# Patient Record
Sex: Female | Born: 1980 | Race: Black or African American | Hispanic: No | Marital: Single | Smoking: Never smoker
Health system: Southern US, Community
[De-identification: ages and names within clinical notes are randomized; demographics above are authoritative.]

## PROBLEM LIST (undated history)

## (undated) ENCOUNTER — Emergency Department (HOSPITAL_COMMUNITY): Admission: EM | Payer: Medicare Other

## (undated) DIAGNOSIS — D571 Sickle-cell disease without crisis: Secondary | ICD-10-CM

## (undated) DIAGNOSIS — L309 Dermatitis, unspecified: Secondary | ICD-10-CM

## (undated) DIAGNOSIS — J45909 Unspecified asthma, uncomplicated: Secondary | ICD-10-CM

## (undated) DIAGNOSIS — Z86711 Personal history of pulmonary embolism: Secondary | ICD-10-CM

## (undated) HISTORY — PX: WISDOM TOOTH EXTRACTION: SHX21

## (undated) HISTORY — PX: TUBAL LIGATION: SHX77

## (undated) HISTORY — PX: ERCP: SHX60

## (undated) HISTORY — PX: PORTA CATH INSERTION: CATH118285

## (undated) HISTORY — PX: CHOLECYSTECTOMY: SHX55

## (undated) HISTORY — PX: JOINT REPLACEMENT: SHX530

---

## 2010-06-25 DIAGNOSIS — D5700 Hb-SS disease with crisis, unspecified: Secondary | ICD-10-CM | POA: Insufficient documentation

## 2011-10-07 DIAGNOSIS — Z8632 Personal history of gestational diabetes: Secondary | ICD-10-CM | POA: Insufficient documentation

## 2012-03-27 DIAGNOSIS — D72829 Elevated white blood cell count, unspecified: Secondary | ICD-10-CM

## 2012-04-03 DIAGNOSIS — D473 Essential (hemorrhagic) thrombocythemia: Secondary | ICD-10-CM

## 2012-06-29 DIAGNOSIS — E237 Disorder of pituitary gland, unspecified: Secondary | ICD-10-CM | POA: Insufficient documentation

## 2012-07-31 DIAGNOSIS — R701 Abnormal plasma viscosity: Secondary | ICD-10-CM | POA: Insufficient documentation

## 2012-08-01 DIAGNOSIS — R52 Pain, unspecified: Secondary | ICD-10-CM | POA: Insufficient documentation

## 2012-10-30 DIAGNOSIS — D571 Sickle-cell disease without crisis: Secondary | ICD-10-CM | POA: Insufficient documentation

## 2012-11-14 DIAGNOSIS — R079 Chest pain, unspecified: Secondary | ICD-10-CM

## 2013-03-02 DIAGNOSIS — N390 Urinary tract infection, site not specified: Secondary | ICD-10-CM | POA: Insufficient documentation

## 2013-05-12 DIAGNOSIS — M1610 Unilateral primary osteoarthritis, unspecified hip: Secondary | ICD-10-CM | POA: Insufficient documentation

## 2013-05-12 DIAGNOSIS — M169 Osteoarthritis of hip, unspecified: Secondary | ICD-10-CM | POA: Insufficient documentation

## 2013-05-19 DIAGNOSIS — Z96649 Presence of unspecified artificial hip joint: Secondary | ICD-10-CM | POA: Insufficient documentation

## 2013-09-19 DIAGNOSIS — E876 Hypokalemia: Secondary | ICD-10-CM

## 2013-11-01 DIAGNOSIS — R52 Pain, unspecified: Secondary | ICD-10-CM

## 2013-12-13 DIAGNOSIS — R112 Nausea with vomiting, unspecified: Secondary | ICD-10-CM | POA: Insufficient documentation

## 2013-12-13 DIAGNOSIS — L299 Pruritus, unspecified: Secondary | ICD-10-CM | POA: Insufficient documentation

## 2014-02-01 DIAGNOSIS — I340 Nonrheumatic mitral (valve) insufficiency: Secondary | ICD-10-CM | POA: Insufficient documentation

## 2017-06-13 DIAGNOSIS — Z9189 Other specified personal risk factors, not elsewhere classified: Secondary | ICD-10-CM | POA: Insufficient documentation

## 2017-10-30 DIAGNOSIS — F419 Anxiety disorder, unspecified: Secondary | ICD-10-CM | POA: Insufficient documentation

## 2017-10-30 DIAGNOSIS — F32A Depression, unspecified: Secondary | ICD-10-CM | POA: Insufficient documentation

## 2018-05-19 DIAGNOSIS — J45909 Unspecified asthma, uncomplicated: Secondary | ICD-10-CM

## 2018-06-02 DIAGNOSIS — K219 Gastro-esophageal reflux disease without esophagitis: Secondary | ICD-10-CM | POA: Insufficient documentation

## 2018-07-03 DIAGNOSIS — D638 Anemia in other chronic diseases classified elsewhere: Secondary | ICD-10-CM | POA: Insufficient documentation

## 2018-07-03 DIAGNOSIS — Z86718 Personal history of other venous thrombosis and embolism: Secondary | ICD-10-CM | POA: Insufficient documentation

## 2018-07-03 DIAGNOSIS — D649 Anemia, unspecified: Secondary | ICD-10-CM | POA: Insufficient documentation

## 2018-07-03 DIAGNOSIS — J4540 Moderate persistent asthma, uncomplicated: Secondary | ICD-10-CM | POA: Insufficient documentation

## 2018-07-03 DIAGNOSIS — Z9289 Personal history of other medical treatment: Secondary | ICD-10-CM | POA: Insufficient documentation

## 2018-08-28 DIAGNOSIS — F1110 Opioid abuse, uncomplicated: Secondary | ICD-10-CM | POA: Insufficient documentation

## 2018-09-13 DIAGNOSIS — Z95828 Presence of other vascular implants and grafts: Secondary | ICD-10-CM | POA: Insufficient documentation

## 2018-09-13 DIAGNOSIS — F1120 Opioid dependence, uncomplicated: Secondary | ICD-10-CM | POA: Insufficient documentation

## 2018-09-26 DIAGNOSIS — I2699 Other pulmonary embolism without acute cor pulmonale: Secondary | ICD-10-CM | POA: Insufficient documentation

## 2018-09-26 DIAGNOSIS — Z86711 Personal history of pulmonary embolism: Secondary | ICD-10-CM

## 2018-10-02 ENCOUNTER — Emergency Department (HOSPITAL_COMMUNITY)
Admission: EM | Admit: 2018-10-02 | Discharge: 2018-10-03 | Disposition: A | Discharge status: Home / Self Care | Payer: Medicare Other | Attending: Emergency Medicine | Admitting: Emergency Medicine

## 2018-10-02 ENCOUNTER — Emergency Department (HOSPITAL_COMMUNITY): Payer: Medicare Other

## 2018-10-02 ENCOUNTER — Encounter (HOSPITAL_COMMUNITY): Payer: Self-pay

## 2018-10-02 DIAGNOSIS — D5700 Hb-SS disease with crisis, unspecified: Secondary | ICD-10-CM | POA: Diagnosis not present

## 2018-10-02 DIAGNOSIS — Z7901 Long term (current) use of anticoagulants: Secondary | ICD-10-CM

## 2018-10-02 DIAGNOSIS — R079 Chest pain, unspecified: Secondary | ICD-10-CM | POA: Diagnosis present

## 2018-10-02 DIAGNOSIS — J45909 Unspecified asthma, uncomplicated: Secondary | ICD-10-CM | POA: Diagnosis not present

## 2018-10-02 DIAGNOSIS — Z79899 Other long term (current) drug therapy: Secondary | ICD-10-CM | POA: Diagnosis not present

## 2018-10-02 HISTORY — DX: Unspecified asthma, uncomplicated: J45.909

## 2018-10-02 HISTORY — DX: Dermatitis, unspecified: L30.9

## 2018-10-02 HISTORY — DX: Sickle-cell disease without crisis: D57.1

## 2018-10-02 LAB — COMPREHENSIVE METABOLIC PANEL
ALT: 34 U/L (ref 0–44)
AST: 44 U/L — ABNORMAL HIGH (ref 15–41)
Albumin: 4.2 g/dL (ref 3.5–5.0)
Alkaline Phosphatase: 80 U/L (ref 38–126)
Anion gap: 7 (ref 5–15)
BUN: 6 mg/dL (ref 6–20)
CO2: 23 mmol/L (ref 22–32)
Calcium: 8.8 mg/dL — ABNORMAL LOW (ref 8.9–10.3)
Chloride: 108 mmol/L (ref 98–111)
Creatinine, Ser: 0.35 mg/dL — ABNORMAL LOW (ref 0.44–1.00)
GFR calc Af Amer: >60 mL/min (ref >60)
GFR calc non Af Amer: >60 mL/min (ref >60)
Glucose, Bld: 100 mg/dL — ABNORMAL HIGH (ref 70–99)
Potassium: 3.5 mmol/L (ref 3.5–5.1)
Sodium: 138 mmol/L (ref 135–145)
Total Bilirubin: 3 mg/dL — ABNORMAL HIGH (ref 0.3–1.2)
Total Protein: 8.3 g/dL — ABNORMAL HIGH (ref 6.5–8.1)

## 2018-10-02 LAB — CBC WITH DIFFERENTIAL/PLATELET
Abs Immature Granulocytes: 0.1 10*3/uL — ABNORMAL HIGH (ref 0.00–0.07)
Basophils Absolute: 0.1 10*3/uL (ref 0.0–0.1)
Basophils Relative: 1 %
Eosinophils Absolute: 0.3 10*3/uL (ref 0.0–0.5)
Eosinophils Relative: 2 %
HCT: 23.5 % — ABNORMAL LOW (ref 36.0–46.0)
Hemoglobin: 8 g/dL — ABNORMAL LOW (ref 12.0–15.0)
Immature Granulocytes: 1 %
Lymphocytes Relative: 21 %
Lymphs Abs: 3.2 10*3/uL (ref 0.7–4.0)
MCH: 30.7 pg (ref 26.0–34.0)
MCHC: 34 g/dL (ref 30.0–36.0)
MCV: 90 fL (ref 80.0–100.0)
Monocytes Absolute: 1.9 10*3/uL — ABNORMAL HIGH (ref 0.1–1.0)
Monocytes Relative: 12 %
Neutro Abs: 10.2 10*3/uL — ABNORMAL HIGH (ref 1.7–7.7)
Neutrophils Relative %: 63 %
Platelets: 619 10*3/uL — ABNORMAL HIGH (ref 150–400)
RBC: 2.61 MIL/uL — ABNORMAL LOW (ref 3.87–5.11)
RDW: 20 % — ABNORMAL HIGH (ref 11.5–15.5)
WBC: 15.8 10*3/uL — ABNORMAL HIGH (ref 4.0–10.5)
nRBC: 0.4 % — ABNORMAL HIGH (ref 0.0–0.2)

## 2018-10-02 LAB — I-STAT BETA HCG BLOOD, ED (MC, WL, AP ONLY): I-stat hCG, quantitative: <5 m[IU]/mL (ref <5)

## 2018-10-02 LAB — RETICULOCYTES
Immature Retic Fract: 12.3 % (ref 2.3–15.9)
RBC.: 2.61 MIL/uL — ABNORMAL LOW (ref 3.87–5.11)
Retic Count, Absolute: 436 10*3/uL — ABNORMAL HIGH (ref 19.0–186.0)
Retic Ct Pct: 15.9 % — ABNORMAL HIGH (ref 0.4–3.1)

## 2018-10-02 LAB — PROTIME-INR
INR: 1.9 — ABNORMAL HIGH (ref 0.8–1.2)
Prothrombin Time: 21.6 seconds — ABNORMAL HIGH (ref 11.4–15.2)

## 2018-10-02 LAB — TROPONIN I: Troponin I: <0.03 ng/mL (ref <0.03)

## 2018-10-02 MED ORDER — BUDESONIDE-FORMOTEROL FUMARATE 160-4.5 MCG/ACT IN AERO
2.00 | INHALATION_SPRAY | RESPIRATORY_TRACT | Status: DC
Start: 2018-09-30 — End: 2018-10-02

## 2018-10-02 MED ORDER — ENOXAPARIN SODIUM 300 MG/3ML IJ SOLN
1.00 | INTRAMUSCULAR | Status: DC
Start: 2018-09-30 — End: 2018-10-02

## 2018-10-02 MED ORDER — DIPHENHYDRAMINE HCL 50 MG/ML IJ SOLN
25.00 | INTRAMUSCULAR | Status: DC
Start: ? — End: 2018-10-02

## 2018-10-02 MED ORDER — DEFERASIROX 500 MG PO TBSO
500.00 | ORAL_TABLET | ORAL | Status: DC
Start: 2018-10-01 — End: 2018-10-02

## 2018-10-02 MED ORDER — VITAMIN B-12 1000 MCG PO TABS
1000.00 | ORAL_TABLET | ORAL | Status: DC
Start: 2018-10-01 — End: 2018-10-02

## 2018-10-02 MED ORDER — PROMETHAZINE HCL 25 MG/ML IJ SOLN
12.5000 mg | Freq: Once | INTRAMUSCULAR | Status: AC
  Administered 2018-10-02: 12.5 mg via INTRAVENOUS

## 2018-10-02 MED ORDER — GENERIC EXTERNAL MEDICATION
Status: DC
Start: ? — End: 2018-10-02

## 2018-10-02 MED ORDER — DOCUSATE SODIUM 100 MG PO CAPS
100.00 | ORAL_CAPSULE | ORAL | Status: DC
Start: ? — End: 2018-10-02

## 2018-10-02 MED ORDER — POLYETHYLENE GLYCOL 3350 17 G PO PACK
17.00 | PACK | ORAL | Status: DC
Start: ? — End: 2018-10-02

## 2018-10-02 MED ORDER — HYDROMORPHONE HCL 2 MG/ML IJ SOLN
2.0000 mg | Freq: Once | INTRAMUSCULAR | Status: AC
  Administered 2018-10-02: 23:00:00 2 mg via INTRAVENOUS
  Filled 2018-10-02: qty 1

## 2018-10-02 MED ORDER — ONDANSETRON 4 MG PO TBDP
4.00 | ORAL_TABLET | ORAL | Status: DC
Start: ? — End: 2018-10-02

## 2018-10-02 MED ORDER — ALUMINUM-MAGNESIUM-SIMETHICONE 200-200-20 MG/5ML PO SUSP
15.00 | ORAL | Status: DC
Start: ? — End: 2018-10-02

## 2018-10-02 MED ORDER — GENERIC EXTERNAL MEDICATION
1.00 | Status: DC
Start: ? — End: 2018-10-02

## 2018-10-02 MED ORDER — PANTOPRAZOLE SODIUM 40 MG PO TBEC
40.00 | DELAYED_RELEASE_TABLET | ORAL | Status: DC
Start: 2018-10-01 — End: 2018-10-02

## 2018-10-02 MED ORDER — ONDANSETRON HCL 4 MG/2ML IJ SOLN
4.0000 mg | INTRAMUSCULAR | Status: DC | PRN
  Filled 2018-10-02: qty 2

## 2018-10-02 MED ORDER — DIPHENHYDRAMINE HCL 25 MG PO CAPS
25.0000 mg | ORAL_CAPSULE | ORAL | Status: DC | PRN
  Administered 2018-10-02: 25 mg via ORAL
  Filled 2018-10-02: qty 1

## 2018-10-02 MED ORDER — HYDROMORPHONE HCL 1 MG/ML IJ SOLN
1.0000 mg | Freq: Once | INTRAMUSCULAR | Status: AC
  Administered 2018-10-02: 22:00:00 1 mg via SUBCUTANEOUS
  Filled 2018-10-02: qty 1

## 2018-10-02 MED ORDER — PROMETHAZINE HCL 25 MG/ML IJ SOLN
INTRAMUSCULAR | Status: AC
  Filled 2018-10-02: qty 1

## 2018-10-02 MED ORDER — SIMETHICONE 80 MG PO CHEW
80.00 | CHEWABLE_TABLET | ORAL | Status: DC
Start: ? — End: 2018-10-02

## 2018-10-02 MED ORDER — GLUTAMINE 5 G PO PACK
15.00 | PACK | ORAL | Status: DC
Start: 2018-09-30 — End: 2018-10-02

## 2018-10-02 MED ORDER — SODIUM CHLORIDE 0.45 % IV SOLN
INTRAVENOUS | Status: DC
  Administered 2018-10-02: 22:00:00 via INTRAVENOUS

## 2018-10-02 MED ORDER — ALBUTEROL SULFATE 2.5 MG/0.5ML IN NEBU
2.50 | INHALATION_SOLUTION | RESPIRATORY_TRACT | Status: DC
Start: ? — End: 2018-10-02

## 2018-10-02 MED ORDER — GENERIC EXTERNAL MEDICATION
10.00 | Status: DC
Start: ? — End: 2018-10-02

## 2018-10-02 MED ORDER — FOLIC ACID 1 MG PO TABS
1.00 | ORAL_TABLET | ORAL | Status: DC
Start: 2018-10-01 — End: 2018-10-02

## 2018-10-02 MED ORDER — HYDROMORPHONE HCL 2 MG PO TABS
4.00 | ORAL_TABLET | ORAL | Status: DC
Start: ? — End: 2018-10-02

## 2018-10-02 MED ORDER — ONDANSETRON HCL 4 MG/2ML IJ SOLN
4.00 | INTRAMUSCULAR | Status: DC
Start: ? — End: 2018-10-02

## 2018-10-02 MED ORDER — MIRTAZAPINE 15 MG PO TABS
45.00 | ORAL_TABLET | ORAL | Status: DC
Start: 2018-09-30 — End: 2018-10-02

## 2018-10-02 MED ORDER — MELATONIN 3 MG PO TABS
3.00 | ORAL_TABLET | ORAL | Status: DC
Start: ? — End: 2018-10-02

## 2018-10-02 MED ORDER — HYDROMORPHONE HCL 1 MG/ML IJ SOLN
2.00 | INTRAMUSCULAR | Status: DC
Start: ? — End: 2018-10-02

## 2018-10-02 MED ORDER — NALOXONE HCL 0.4 MG/ML IJ SOLN
.04 | INTRAMUSCULAR | Status: DC
Start: ? — End: 2018-10-02

## 2018-10-02 MED ORDER — PROMETHAZINE HCL 25 MG PO TABS
12.50 | ORAL_TABLET | ORAL | Status: DC
Start: ? — End: 2018-10-02

## 2018-10-02 MED ORDER — WARFARIN SODIUM 2.5 MG PO TABS
2.50 | ORAL_TABLET | ORAL | Status: DC
Start: ? — End: 2018-10-02

## 2018-10-02 NOTE — ED Triage Notes (Signed)
Pt complains of an achy chest pain for two days due to her sickle cell  Pt denies a cough or short of breath

## 2018-10-02 NOTE — ED Provider Notes (Signed)
[facility] DEPT Provider Note   CSN: [id number] Arrival date & time: 10/02/18  1920    History   Chief Complaint Chief Complaint  Patient presents with  . Sickle Cell Pain Crisis    HPI Molly Gutierrez is a 38 y.o. female.     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  Patient reports that this feels consistent with her prior sickle cell pain crises.  Patient reports that she usually gets chest pain in the substernal region described as achy with her sickle cell disease.  She denies any shortness of breath, cough, or fever.  Patient denies any abdominal pain, nausea, vomiting, or diarrhea.  She denies any hemoptysis.  Patient denies any symptoms with exertion.  Patient was discharged 2 days ago from [facility] in [place], [place] for her pulmonary embolism.  Patient recently established care with a new hematologist in the [facility].  Previously followed by [facility].  Patient reports that she was on her way to visit her previous provider in [place] when she stopped in [place] due to the pain. Reports supportive family who is assisting in medical transport.   Past Medical History:  Diagnosis Date  . Asthma   . Eczema   . Sickle cell anemia (HCC)     There are no active problems to display for this patient.   History reviewed. No pertinent surgical history.   OB History   No obstetric history on file.      Home Medications    Prior to Admission medications   Medication Sig Start Date End Date Taking? Authorizing Provider  albuterol (VENTOLIN HFA) 108 (90 Base) MCG/ACT inhaler Inhale 2 puffs into the lungs 2 (two) times daily as needed for wheezing or shortness of breath. 12/31/16  Yes  [provider]  Deferasirox 360 MG TABS Take 1,080 mg by mouth daily. 09/27/16  Yes [provider]  diphenhydrAMINE (BENADRYL) 25 mg capsule Take 25 mg by mouth 3 (three) times daily as needed for itching.   Yes [provider]  folic acid (FOLVITE) 1 MG tablet Take 1 mg by mouth daily. 12/31/16  Yes [provider]  Glutamine (GLUTASOLVE) 15 g PACK Take 15 g by mouth every 12 (twelve) hours. 09/23/18 10/24/18 Yes [provider]  HYDROmorphone (DILAUDID) 4 MG tablet Take 4 mg by mouth every 3 (three) hours as needed. 09/30/18  Yes [provider]  mirtazapine (REMERON) 45 MG tablet Take 45 mg by mouth at bedtime. 09/30/18  Yes [provider]  omeprazole (PRILOSEC) 20 MG capsule Take 20 mg by mouth 2 (two) times a day. 09/08/18  Yes [provider]  vitamin B-12 (CYANOCOBALAMIN) 1000 MCG tablet Take 1,000 mcg by mouth daily.   Yes [provider]  warfarin (COUMADIN) 5 MG tablet Take 5 mg by mouth 2 (two) times a day. 09/30/18  Yes [provider]    Family History History reviewed. No pertinent family history.  Social History Social History   Tobacco Use  . Smoking status: Never Smoker  . Smokeless tobacco: Never Used  Substance Use Topics  . Alcohol use: Never    Frequency: Never  . Drug use: Never     Allergies   Cefaclor and Hydroxyurea   Review of Systems Review of  Systems  Constitutional: Negative for chills and fever.  HENT: Negative for congestion, rhinorrhea, sinus pain and sore throat.   Eyes: Negative for visual disturbance.  Respiratory: Negative for cough, chest tightness and shortness of breath.   Cardiovascular: Positive for chest pain. Negative for palpitations and leg swelling.  Gastrointestinal: Positive for nausea. Negative for abdominal pain and vomiting.  Genitourinary: Negative for dysuria and flank pain.  Musculoskeletal: Negative for back pain and myalgias.  Skin:  Negative for rash.  Neurological: Negative for dizziness, syncope, light-headedness and headaches.     Physical Exam Updated Vital Signs BP 108/81 (BP Location: Left Arm)   Pulse 100   Temp 99.6 F (37.6 C) (Oral)   Resp 20   Ht 5\' 2"  (1.575 m)   Wt 54 kg   LMP 09/07/2018   SpO2 95%   BMI 21.77 kg/m   Physical Exam Vitals signs and nursing note reviewed.  Constitutional:      General: She is not in acute distress.    Appearance: She is well-developed.  HENT:     Head: Normocephalic and atraumatic.  Eyes:     Conjunctiva/sclera: Conjunctivae normal.     Pupils: Pupils are equal, round, and reactive to light.  Neck:     Musculoskeletal: Normal range of motion and neck supple.  Cardiovascular:     Rate and Rhythm: Normal rate and regular rhythm.     Pulses:          Radial pulses are 2+ on the right side and 2+ on the left side.     Heart sounds: S1 normal and S2 normal. No murmur.     Comments: No LE edema or calf TTP. Pulmonary:     Effort: Pulmonary effort is normal.     Breath sounds: Normal breath sounds. No wheezing or rales.  Abdominal:     General: There is no distension.     Palpations: Abdomen is soft.     Tenderness: There is no abdominal tenderness. There is no guarding.  Musculoskeletal: Normal range of motion.        General: No deformity.  Lymphadenopathy:     Cervical: No cervical adenopathy.  Skin:    General: Skin is warm and dry.     Findings: No erythema or rash.  Neurological:     Mental Status: She is alert.     Comments: Cranial nerves grossly intact. Patient moves extremities symmetrically and with good coordination.  Psychiatric:        Behavior: Behavior normal.        Thought Content: Thought content normal.        Judgment: Judgment normal.      ED Treatments / Results  Labs (all labs ordered are listed, but only abnormal results are displayed) Labs Reviewed  PROTIME-INR - Abnormal; Notable for the following components:       Result Value   Prothrombin Time 21.6 (*)    INR 1.9 (*)    All other components within normal limits  COMPREHENSIVE METABOLIC PANEL - Abnormal; Notable for the following components:   Glucose, Bld 100 (*)    Creatinine, Ser 0.35 (*)    Calcium 8.8 (*)    Total Protein 8.3 (*)    AST 44 (*)    Total Bilirubin 3.0 (*)    All other components within normal limits  RETICULOCYTES - Abnormal; Notable for the following components:   Retic Ct Pct 15.9 (*)    RBC. 2.61 (*)  Retic Count, Absolute 436.0 (*)    All other components within normal limits  CBC WITH DIFFERENTIAL/PLATELET - Abnormal; Notable for the following components:   WBC 15.8 (*)    RBC 2.61 (*)    Hemoglobin 8.0 (*)    HCT 23.5 (*)    RDW 20.0 (*)    Platelets 619 (*)    nRBC 0.4 (*)    Neutro Abs 10.2 (*)    Monocytes Absolute 1.9 (*)    Abs Immature Granulocytes 0.10 (*)    All other components within normal limits  TROPONIN I  I-STAT BETA HCG BLOOD, ED ([facility], [facility], [facility] ONLY)    EKG EKG Interpretation  Date/Time:  Friday October 02 2018 19:50:32 EDT Ventricular Rate:  94 PR Interval:    QRS Duration: 82 QT Interval:  393 QTC Calculation: 492 R Axis:   39 Text Interpretation:  Sinus rhythm Borderline T abnormalities, anterior leads Borderline prolonged QT interval No old tracing to compare Confirmed by Sherwood Gambler [phone]) on 10/02/2018 8:20:40 PM   Radiology Dg Chest Port 1 View  Result Date: 10/02/2018 CLINICAL DATA:  Chest pain EXAM: PORTABLE CHEST 1 VIEW COMPARISON:  None. FINDINGS: 1949 hours. The lungs are clear without focal pneumonia, edema, pneumothorax or pleural effusion. Cardiopericardial silhouette is at upper limits of normal for size. Right Port-A-Cath tip overlies distal SVC. Left Port-A-Cath tip overlies the SVC/RA junction. The visualized bony structures of the thorax are intact. Telemetry leads overlie the chest. IMPRESSION: No active disease. Electronically Signed   By: Misty Stanley M.D.    On: 10/02/2018 20:54    Procedures Procedures (including critical care time)  Medications Ordered in ED Medications  0.45 % sodium chloride infusion (has no administration in time range)  HYDROmorphone (DILAUDID) injection 1 mg (has no administration in time range)  ondansetron (ZOFRAN) injection 4 mg (has no administration in time range)  diphenhydrAMINE (BENADRYL) capsule 25-50 mg (has no administration in time range)     Initial Impression / Assessment and Plan / ED Course  I have reviewed the triage vital signs and the nursing notes.  Pertinent labs & imaging results that were available during my care of the patient were reviewed by me and considered in my medical decision making (see chart for details).  Clinical Course as of Oct 02 24  Fri Oct 02, 2018  2221 Slightly subtherapeutic.   INR(!): 1.9 [AM]  2231 At baseline.   Hemoglobin(!): 8.0 [AM]  2232 Appears at baseline from 2 days ago.  Total Bilirubin(!): 3.0 [AM]  Sat Oct 03, 2018  0013 Patient ports he feels much better would like to go home.  I feel this is reasonable.  I did stress the importance with the patient of getting into contact with care management at [facility] for INR recheck.    [AM]  0014 Stable and persistent.   WBC(!): 15.8 [AM]  0014 Stable and persistent.   Platelets(!): 619 [AM]    Clinical Course User Index [AM] Albesa Seen, PA-C       Rosea Dory is a 38 y.o. female who presents to ED for pain c/w typical sickle cell crisis. No shortness of breath, fevers or signs of acute chest.  Patient has normal vital signs.  She reports that she has been taking her warfarin as prescribed and was discharged 2 days ago.  Will check PT/INR today.  Patient otherwise in no acute distress objectively other than complaint of pain. Given IV fluids and pain medication. Will  obtain labs and continue to monitor with admit vs. Discharge based on response and results of testing.   Labs reviewed during  patient's admission at [facility].  Patient's reticulocyte count is up from last evaluation on 4/11 today, but lower than it was when she was admitted to the hospital from 4/11-4/15.  Otherwise, leukocytosis, and platelet count are at baseline. INR 1.9 and goal is 2-3. Patient has not had any missed doses after discharge. Troponin is negative. EKG, reviewed by me, is nonischemic. CXR shows ports in place but no ischemia, infarction, or arrhythmia.   Patient re-evaluated and feels much improved. Patient felt better after 2 doses of Dilaudid and is requesting discharge. Comfortable with discharge to home. Understands to follow up with PCP and reasons to return to ER. All questions answered.  Discussed with the patient that she needs urgent INR check.  Verified that patient's phone number on record with [facility] is correct.   Final Clinical Impressions(s) / ED Diagnoses   Final diagnoses:  Sickle cell pain crisis ([place])  Long term current use of anticoagulants with INR goal of 2.0-3.0    ED Discharge Orders    None       Tamala Julian 10/03/18 0030    Sherwood Gambler, MD 10/05/18 [phone]

## 2018-10-03 DIAGNOSIS — D5700 Hb-SS disease with crisis, unspecified: Secondary | ICD-10-CM | POA: Diagnosis not present

## 2018-10-03 MED ORDER — HEPARIN SOD (PORK) LOCK FLUSH 100 UNIT/ML IV SOLN
500.0000 [IU] | Freq: Once | INTRAVENOUS | Status: AC
  Administered 2018-10-03: 500 [IU]
  Filled 2018-10-03: qty 5

## 2018-10-03 NOTE — Discharge Instructions (Addendum)
Please see the information and instructions below regarding your visit.  Your diagnoses today include:  1. Sickle cell pain crisis ([place])   2. Long term current use of anticoagulants with INR goal of 2.0-3.0     Tests performed today include: See side panel of your discharge paperwork for testing performed today. Vital signs are listed at the bottom of these instructions.   Medications prescribed:    Take any prescribed medications only as prescribed, and any over the counter medications only as directed on the packaging.  Please ensure you continue your warfarin therapy as prescribed.  It is very important that you call on Monday at [facility] to get INR checked.   Home care instructions:  Please follow any educational materials contained in this packet.   Follow-up instructions:  Return instructions:  Please return to the Emergency Department if you experience worsening symptoms.  Please return to the emergency department if you develop any chest pain, shortness of breath, cough or any fevers.  Please return if you have any other emergent concerns.  Additional Information:   Your vital signs today were: BP 109/79 (BP Location: Left Arm)    Pulse 87    Temp 99.6 F (37.6 C) (Oral)    Resp 17    Ht 5\' 2"  (1.575 m)    Wt 54 kg    LMP 09/07/2018    SpO2 95%    BMI 21.77 kg/m  If your blood pressure (BP) was elevated on multiple readings during this visit above 130 for the top number or above 80 for the bottom number, please have this repeated by your primary care provider within one month. --------------  Thank you for allowing [place] to participate in your care today.

## 2018-11-12 DIAGNOSIS — Z7901 Long term (current) use of anticoagulants: Secondary | ICD-10-CM | POA: Insufficient documentation

## 2018-11-13 DIAGNOSIS — R319 Hematuria, unspecified: Secondary | ICD-10-CM | POA: Insufficient documentation

## 2018-11-13 DIAGNOSIS — R1013 Epigastric pain: Secondary | ICD-10-CM | POA: Insufficient documentation

## 2018-11-13 DIAGNOSIS — R0789 Other chest pain: Secondary | ICD-10-CM | POA: Insufficient documentation

## 2018-12-19 DIAGNOSIS — E860 Dehydration: Secondary | ICD-10-CM | POA: Insufficient documentation

## 2019-03-27 DIAGNOSIS — J189 Pneumonia, unspecified organism: Secondary | ICD-10-CM | POA: Insufficient documentation

## 2019-04-17 ENCOUNTER — Emergency Department (HOSPITAL_COMMUNITY): Payer: Medicare Other

## 2019-04-17 ENCOUNTER — Encounter (HOSPITAL_COMMUNITY): Payer: Self-pay | Admitting: Emergency Medicine

## 2019-04-17 ENCOUNTER — Other Ambulatory Visit: Payer: Self-pay

## 2019-04-17 ENCOUNTER — Emergency Department (HOSPITAL_COMMUNITY)
Admission: EM | Admit: 2019-04-17 | Discharge: 2019-04-17 | Disposition: A | Discharge status: Home / Self Care | Payer: Medicare Other | Source: Home / Self Care | Attending: Emergency Medicine | Admitting: Emergency Medicine

## 2019-04-17 DIAGNOSIS — D5700 Hb-SS disease with crisis, unspecified: Secondary | ICD-10-CM

## 2019-04-17 DIAGNOSIS — J45909 Unspecified asthma, uncomplicated: Secondary | ICD-10-CM | POA: Insufficient documentation

## 2019-04-17 DIAGNOSIS — Z79899 Other long term (current) drug therapy: Secondary | ICD-10-CM | POA: Insufficient documentation

## 2019-04-17 LAB — CBC WITH DIFFERENTIAL/PLATELET
Abs Immature Granulocytes: 0.08 10*3/uL — ABNORMAL HIGH (ref 0.00–0.07)
Basophils Absolute: 0.1 10*3/uL (ref 0.0–0.1)
Basophils Relative: 1 %
Eosinophils Absolute: 0.1 10*3/uL (ref 0.0–0.5)
Eosinophils Relative: 1 %
HCT: 24.2 % — ABNORMAL LOW (ref 36.0–46.0)
Hemoglobin: 8 g/dL — ABNORMAL LOW (ref 12.0–15.0)
Immature Granulocytes: 1 %
Lymphocytes Relative: 29 %
Lymphs Abs: 4.8 10*3/uL — ABNORMAL HIGH (ref 0.7–4.0)
MCH: 29.6 pg (ref 26.0–34.0)
MCHC: 33.1 g/dL (ref 30.0–36.0)
MCV: 89.6 fL (ref 80.0–100.0)
Monocytes Absolute: 2.3 10*3/uL — ABNORMAL HIGH (ref 0.1–1.0)
Monocytes Relative: 14 %
Neutro Abs: 8.8 10*3/uL — ABNORMAL HIGH (ref 1.7–7.7)
Neutrophils Relative %: 54 %
Platelets: 585 10*3/uL — ABNORMAL HIGH (ref 150–400)
RBC: 2.7 MIL/uL — ABNORMAL LOW (ref 3.87–5.11)
RDW: 18.6 % — ABNORMAL HIGH (ref 11.5–15.5)
WBC: 16.2 10*3/uL — ABNORMAL HIGH (ref 4.0–10.5)
nRBC: 0.2 % (ref 0.0–0.2)

## 2019-04-17 LAB — BASIC METABOLIC PANEL
Anion gap: 9 (ref 5–15)
BUN: 12 mg/dL (ref 6–20)
CO2: 21 mmol/L — ABNORMAL LOW (ref 22–32)
Calcium: 9.3 mg/dL (ref 8.9–10.3)
Chloride: 106 mmol/L (ref 98–111)
Creatinine, Ser: 0.49 mg/dL (ref 0.44–1.00)
GFR calc Af Amer: >60 mL/min (ref >60)
GFR calc non Af Amer: >60 mL/min (ref >60)
Glucose, Bld: 91 mg/dL (ref 70–99)
Potassium: 3.8 mmol/L (ref 3.5–5.1)
Sodium: 136 mmol/L (ref 135–145)

## 2019-04-17 LAB — RETICULOCYTES
Immature Retic Fract: 18.2 % — ABNORMAL HIGH (ref 2.3–15.9)
RBC.: 2.7 MIL/uL — ABNORMAL LOW (ref 3.87–5.11)
Retic Count, Absolute: 236 10*3/uL — ABNORMAL HIGH (ref 19.0–186.0)
Retic Ct Pct: 8.7 % — ABNORMAL HIGH (ref 0.4–3.1)

## 2019-04-17 LAB — POC URINE PREG, ED: Preg Test, Ur: NEGATIVE

## 2019-04-17 LAB — TYPE AND SCREEN
ABO/RH(D): A POS
Antibody Screen: NEGATIVE

## 2019-04-17 LAB — ABO/RH: ABO/RH(D): A POS

## 2019-04-17 MED ORDER — HYDROMORPHONE HCL 1 MG/ML IJ SOLN
1.0000 mg | INTRAMUSCULAR | Status: AC
  Administered 2019-04-17: 1 mg via INTRAVENOUS
  Filled 2019-04-17: qty 1

## 2019-04-17 MED ORDER — ONDANSETRON HCL 4 MG/2ML IJ SOLN
4.0000 mg | INTRAMUSCULAR | Status: DC | PRN
  Administered 2019-04-17: 4 mg via INTRAVENOUS
  Filled 2019-04-17: qty 2

## 2019-04-17 MED ORDER — HYDROMORPHONE HCL 1 MG/ML IJ SOLN: 1.0000 mg | INTRAMUSCULAR | Status: AC

## 2019-04-17 MED ORDER — KETOROLAC TROMETHAMINE 30 MG/ML IJ SOLN
30.0000 mg | INTRAMUSCULAR | Status: AC
  Administered 2019-04-17: 30 mg via INTRAVENOUS
  Filled 2019-04-17: qty 1

## 2019-04-17 MED ORDER — DIPHENHYDRAMINE HCL 50 MG/ML IJ SOLN
25.0000 mg | Freq: Once | INTRAMUSCULAR | Status: AC
  Administered 2019-04-17: 25 mg via INTRAVENOUS
  Filled 2019-04-17: qty 1

## 2019-04-17 MED ORDER — HYDROMORPHONE HCL 1 MG/ML IJ SOLN: 0.5000 mg | INTRAMUSCULAR | Status: AC

## 2019-04-17 MED ORDER — DIPHENHYDRAMINE HCL 25 MG PO CAPS
25.0000 mg | ORAL_CAPSULE | Freq: Once | ORAL | Status: AC
  Administered 2019-04-17: 25 mg via ORAL
  Filled 2019-04-17: qty 1

## 2019-04-17 MED ORDER — HYDROMORPHONE HCL 1 MG/ML IJ SOLN
0.5000 mg | INTRAMUSCULAR | Status: AC
  Administered 2019-04-17: 0.5 mg via INTRAVENOUS
  Filled 2019-04-17: qty 1

## 2019-04-17 MED ORDER — HEPARIN SOD (PORK) LOCK FLUSH 100 UNIT/ML IV SOLN
500.0000 [IU] | Freq: Once | INTRAVENOUS | Status: AC
  Administered 2019-04-17: 500 [IU]
  Filled 2019-04-17: qty 5

## 2019-04-17 MED ORDER — DIPHENHYDRAMINE HCL 25 MG PO CAPS
25.0000 mg | ORAL_CAPSULE | ORAL | Status: DC | PRN
  Administered 2019-04-17: 25 mg via ORAL
  Filled 2019-04-17: qty 1

## 2019-04-17 NOTE — Discharge Instructions (Addendum)
Please keep your appointment with your sickle cell specialist at [facility] on the 5th. If you have any new or concerning symptoms return to the ED for evaluation.

## 2019-04-17 NOTE — ED Provider Notes (Addendum)
[facility] DEPT Provider Note   CSN: [id number] Arrival date & time: 04/17/19  0557     History   Chief Complaint Chief Complaint  Patient presents with  . Sickle Cell Pain Crisis    HPI Molly Gutierrez is a 38 y.o. female history of SSC, PE on xarelto     HPI  Presents today with chest pain and left leg pain began at 3 AM this morning  Patient states chest pain is constant, nonpleuritic, sharp, 10/10, consistent with her typical sickle cell pain.  Denies any associated shortness of breath.  Patient states she was hospitalized at [facility] 10/17 which was controlled with Dilaudid in the ED.   Patient states she was diagnosed with pulmonary embolism in April and has been on Xarelto since.  States that she takes her medications diligently rarely forgets to take them. States her only pain control is 4mg  dilaudid PO. Is not on any long-acting pain medication.   Patient denies any infectious symptoms such as dysuria, congestion, fever.  Patient also denies any vomiting states that she has been hydrating appropriately.    Past Medical History:  Diagnosis Date  . Asthma   . Eczema   . Sickle cell anemia (HCC)     There are no active problems to display for this patient.   History reviewed. No pertinent surgical history.   OB History   No obstetric history on file.      Home Medications    Prior to Admission medications   Medication Sig Start Date End Date Taking? Authorizing Provider  albuterol (VENTOLIN HFA) 108 (90 Base) MCG/ACT inhaler Inhale 2 puffs into the lungs 2 (two) times daily as needed for wheezing or shortness of breath. 12/31/16  Yes [provider]  Cholecalciferol (VITAMIN D3) 25 MCG (1000 UT) CAPS Take 1,000 Units by mouth daily.   Yes [provider]  Deferasirox 360 MG TABS Take 1,080 mg by mouth daily. 09/27/16  Yes [provider]  diphenhydrAMINE (BENADRYL) 25 mg capsule Take 25 mg  by mouth 3 (three) times daily as needed for itching.   Yes [provider]  folic acid (FOLVITE) 1 MG tablet Take 1 mg by mouth daily. 12/31/16  Yes [provider]  HYDROmorphone (DILAUDID) 4 MG tablet Take 4 mg by mouth every 3 (three) hours as needed for severe pain.  09/30/18  Yes [provider]  mirtazapine (REMERON) 45 MG tablet Take 45 mg by mouth at bedtime. 09/30/18  Yes [provider]  mometasone-formoterol (DULERA) 100-5 MCG/ACT AERO Inhale 2 puffs into the lungs daily as needed for wheezing or shortness of breath.   Yes [provider]  omeprazole (PRILOSEC) 20 MG capsule Take 20 mg by mouth 2 (two) times a day. 09/08/18  Yes [provider]  promethazine (PHENERGAN) 25 MG tablet Take 25 mg by mouth every 6 (six) hours as needed for nausea/vomiting.   Yes [provider]  vitamin B-12 (CYANOCOBALAMIN) 1000 MCG tablet Take 1,000 mcg by mouth daily.   Yes [provider]  voxelotor (OXBRYTA) 500 MG TABS tablet Take 1,500 mg by mouth daily.   Yes [provider]  XARELTO 20 MG TABS tablet Take 20 mg by mouth daily. 12/16/18  Yes [provider]    Family History No family history on file.  Social History Social History   Tobacco Use  . Smoking status: Never Smoker  . Smokeless tobacco: Never Used  Substance Use Topics  .  Alcohol use: Never    Frequency: Never  . Drug use: Never     Allergies   Cefaclor and Hydroxyurea   Review of Systems Review of Systems  Constitutional: Negative for fever.  HENT: Negative for congestion.   Eyes: Negative for pain.  Respiratory: Negative for cough and shortness of breath.   Cardiovascular: Positive for chest pain. Negative for leg swelling.  Gastrointestinal: Negative for vomiting.  Genitourinary: Negative for dysuria.  Musculoskeletal: Negative for myalgias.  Skin: Negative for rash.  Neurological: Negative for dizziness.     Physical Exam  Updated Vital Signs BP 100/69 (BP Location: Right Arm)   Pulse 88   Temp 98.6 F (37 C)   Resp 14   Ht 5\' 3"  (1.6 m)   Wt 57.6 kg   LMP 04/06/2019   SpO2 94%   BMI 22.50 kg/m   Physical Exam Vitals signs and nursing note reviewed.  Constitutional:      General: She is not in acute distress. HENT:     Head: Normocephalic and atraumatic.     Nose: Nose normal.  Eyes:     General: No scleral icterus. Neck:     Musculoskeletal: Normal range of motion.  Cardiovascular:     Rate and Rhythm: Normal rate and regular rhythm.     Pulses: Normal pulses.     Heart sounds: Normal heart sounds.     Comments: BLLE pulses equal and palpable Pulmonary:     Effort: Pulmonary effort is normal. No respiratory distress.     Breath sounds: No wheezing.  Abdominal:     Palpations: Abdomen is soft.     Tenderness: There is no abdominal tenderness.  Musculoskeletal:     Right lower leg: No edema.     Left lower leg: No edema.     Comments: Legs are symmetrical with no edema and no calf tenderness. Bony tenderness diffusely along left leg.  Chest wall tenderness over sternum and clavicles.  Skin:    General: Skin is warm and dry.     Capillary Refill: Capillary refill takes less than 2 seconds.  Neurological:     Mental Status: She is alert. Mental status is at baseline.  Psychiatric:        Mood and Affect: Mood normal.        Behavior: Behavior normal.      ED Treatments / Results  Labs (all labs ordered are listed, but only abnormal results are displayed) Labs Reviewed  CBC WITH DIFFERENTIAL/PLATELET - Abnormal; Notable for the following components:      Result Value   WBC 16.2 (*)    RBC 2.70 (*)    Hemoglobin 8.0 (*)    HCT 24.2 (*)    RDW 18.6 (*)    Platelets 585 (*)    Neutro Abs 8.8 (*)    Lymphs Abs 4.8 (*)    Monocytes Absolute 2.3 (*)    Abs Immature Granulocytes 0.08 (*)    All other components within normal limits  RETICULOCYTES - Abnormal; Notable for the  following components:   Retic Ct Pct 8.7 (*)    RBC. 2.70 (*)    Retic Count, Absolute 236.0 (*)    Immature Retic Fract 18.2 (*)    All other components within normal limits  BASIC METABOLIC PANEL - Abnormal; Notable for the following components:   CO2 21 (*)    All other components within normal limits  POC URINE PREG, ED  TYPE AND SCREEN  ABO/RH    EKG EKG Interpretation  Date/Time:  Saturday April 17 2019 08:49:14 EDT Ventricular Rate:  90 PR Interval:    QRS Duration: 83 QT Interval:  386 QTC Calculation: 473 R Axis:   43 Text Interpretation: Sinus rhythm No significant change since last tracing Confirmed by Dorie Rank [phone]) on 04/17/2019 8:55:54 AM   Radiology Dg Chest Port 1 View  Result Date: 04/17/2019 CLINICAL DATA:  Chest pain. Sickle cell anemia. Asthma. EXAM: PORTABLE CHEST 1 VIEW COMPARISON:  10/02/2018 FINDINGS: Mildly enlarged cardiac silhouette without significant change. Stable bilateral jugular porta catheters. Clear lungs with normal vascularity. Unremarkable bones. IMPRESSION: Stable mild cardiomegaly. No acute abnormality. Electronically Signed   By: Claudie Revering M.D.   On: 04/17/2019 08:49    Procedures Procedures (including critical care time)  Medications Ordered in ED Medications  ketorolac (TORADOL) 30 MG/ML injection 30 mg (30 mg Intravenous Given 04/17/19 0849)  HYDROmorphone (DILAUDID) injection 0.5 mg (0.5 mg Intravenous Given 04/17/19 0840)    Or  HYDROmorphone (DILAUDID) injection 0.5 mg ( Subcutaneous See Alternative 04/17/19 0840)  HYDROmorphone (DILAUDID) injection 1 mg (1 mg Intravenous Given 04/17/19 0916)    Or  HYDROmorphone (DILAUDID) injection 1 mg ( Subcutaneous See Alternative 04/17/19 0916)  HYDROmorphone (DILAUDID) injection 1 mg (1 mg Intravenous Given 04/17/19 1011)    Or  HYDROmorphone (DILAUDID) injection 1 mg ( Subcutaneous See Alternative 04/17/19 1011)  diphenhydrAMINE (BENADRYL) capsule 25 mg (25 mg Oral Given  04/17/19 1011)  HYDROmorphone (DILAUDID) injection 1 mg (1 mg Intravenous Given 04/17/19 1304)    Or  HYDROmorphone (DILAUDID) injection 1 mg ( Subcutaneous See Alternative 04/17/19 1304)  diphenhydrAMINE (BENADRYL) injection 25 mg (25 mg Intravenous Given 04/17/19 1303)  heparin lock flush 100 unit/mL (500 Units Intracatheter Given 04/17/19 1418)     Initial Impression / Assessment and Plan / ED Course  I have reviewed the triage vital signs and the nursing notes.  Pertinent labs & imaging results that were available during my care of the patient were reviewed by me and considered in my medical decision making (see chart for details).        Patient with elevated WBC but only mildly changed from prior lab work likely stress-induced, unlikely to be due to infection she has no infectious symptoms and has normal chest x-ray.  Reticulocyte count is elevated but lower than prior results. Patient pain improved on reevaluation. Patient EKG and CXR personally reviewed. No concern for acute process. Patient exhibits behavior concerning for drug-seeking asked repeatedly for benadryl to be administered IV instead of PO.   On review of EMR patient had CT of chest for PE study conducted during last hospital visit without abnormal finding 10/11. Noted to have a history of drug-seeking behavior at that [facility].   On recheck patient states pain is improved. States she is ready to be discharged home.   Vitals are WNL at time of discharge. Patient was intermittently tachycardic when provider/nurses in the room but would be 80-90 HR on telemetry when alone in dark room resting or on phone. Doubt patient is in need of further emergent care. Patient has appointment on Nov 5th. Plan is to use home medications until then.     The patient appears reasonably screened and/or stabilized for discharge and I doubt any other medical condition or other [facility] requiring further screening, evaluation,  or treatment in the ED at this time prior to discharge.  Patient is hemodynamically stable, in NAD, and able to  ambulate in the ED. Pain has been managed or a plan has been made for home management and has no complaints prior to discharge. Patient is comfortable with above plan and is stable for discharge at this time. All questions were answered prior to disposition. Results from the ER workup discussed with the patient face to face and all questions answered to the best of my ability. The patient is safe for discharge with strict return precautions. Patient appears safe for discharge with appropriate follow-up.  Conveyed my impression with the patient and he voiced understanding and is agreeable to plan.   An After Visit Summary was printed and given to the patient.  Portions of this note were generated with [employer]. Dictation errors may occur despite best attempts at proofreading.    Final Clinical Impressions(s) / ED Diagnoses   Final diagnoses:  Sickle cell anemia with pain [facility])    ED Discharge Orders    None       Tedd Sias, [place] 04/17/19 2103    Tedd Sias, [place] 04/17/19 2104    Dorie Rank, MD 04/18/19 [phone]

## 2019-04-17 NOTE — ED Triage Notes (Signed)
Patient states this crisis pain woke her up around 3 am this morning, states pain in her chest and left leg.

## 2019-04-18 ENCOUNTER — Other Ambulatory Visit: Payer: Self-pay

## 2019-04-18 DIAGNOSIS — G894 Chronic pain syndrome: Secondary | ICD-10-CM | POA: Diagnosis present

## 2019-04-18 DIAGNOSIS — Z7901 Long term (current) use of anticoagulants: Secondary | ICD-10-CM

## 2019-04-18 DIAGNOSIS — Z881 Allergy status to other antibiotic agents status: Secondary | ICD-10-CM

## 2019-04-18 DIAGNOSIS — L309 Dermatitis, unspecified: Secondary | ICD-10-CM | POA: Diagnosis present

## 2019-04-18 DIAGNOSIS — N63 Unspecified lump in unspecified breast: Secondary | ICD-10-CM | POA: Diagnosis present

## 2019-04-18 DIAGNOSIS — Z79899 Other long term (current) drug therapy: Secondary | ICD-10-CM

## 2019-04-18 DIAGNOSIS — J4520 Mild intermittent asthma, uncomplicated: Secondary | ICD-10-CM | POA: Diagnosis present

## 2019-04-18 DIAGNOSIS — Z888 Allergy status to other drugs, medicaments and biological substances status: Secondary | ICD-10-CM

## 2019-04-18 DIAGNOSIS — D5700 Hb-SS disease with crisis, unspecified: Principal | ICD-10-CM | POA: Diagnosis present

## 2019-04-18 DIAGNOSIS — Z20828 Contact with and (suspected) exposure to other viral communicable diseases: Secondary | ICD-10-CM | POA: Diagnosis present

## 2019-04-18 DIAGNOSIS — Z86711 Personal history of pulmonary embolism: Secondary | ICD-10-CM

## 2019-04-19 ENCOUNTER — Inpatient Hospital Stay (HOSPITAL_COMMUNITY)
Admission: EM | Admit: 2019-04-19 | Discharge: 2019-04-26 | DRG: 812 | Disposition: A | Discharge status: Home / Self Care | Payer: Medicare Other | Attending: Internal Medicine | Admitting: Internal Medicine

## 2019-04-19 ENCOUNTER — Encounter (HOSPITAL_COMMUNITY): Payer: Self-pay

## 2019-04-19 ENCOUNTER — Emergency Department (HOSPITAL_COMMUNITY): Payer: Medicare Other

## 2019-04-19 DIAGNOSIS — Z20828 Contact with and (suspected) exposure to other viral communicable diseases: Secondary | ICD-10-CM | POA: Diagnosis present

## 2019-04-19 DIAGNOSIS — D473 Essential (hemorrhagic) thrombocythemia: Secondary | ICD-10-CM | POA: Diagnosis not present

## 2019-04-19 DIAGNOSIS — G894 Chronic pain syndrome: Secondary | ICD-10-CM | POA: Diagnosis present

## 2019-04-19 DIAGNOSIS — Z79899 Other long term (current) drug therapy: Secondary | ICD-10-CM | POA: Diagnosis not present

## 2019-04-19 DIAGNOSIS — E876 Hypokalemia: Secondary | ICD-10-CM | POA: Diagnosis not present

## 2019-04-19 DIAGNOSIS — Z86711 Personal history of pulmonary embolism: Secondary | ICD-10-CM

## 2019-04-19 DIAGNOSIS — Z888 Allergy status to other drugs, medicaments and biological substances status: Secondary | ICD-10-CM | POA: Diagnosis not present

## 2019-04-19 DIAGNOSIS — Z881 Allergy status to other antibiotic agents status: Secondary | ICD-10-CM | POA: Diagnosis not present

## 2019-04-19 DIAGNOSIS — J4520 Mild intermittent asthma, uncomplicated: Secondary | ICD-10-CM | POA: Diagnosis present

## 2019-04-19 DIAGNOSIS — N63 Unspecified lump in unspecified breast: Secondary | ICD-10-CM | POA: Diagnosis present

## 2019-04-19 DIAGNOSIS — R52 Pain, unspecified: Secondary | ICD-10-CM

## 2019-04-19 DIAGNOSIS — D5700 Hb-SS disease with crisis, unspecified: Secondary | ICD-10-CM | POA: Diagnosis present

## 2019-04-19 DIAGNOSIS — D72829 Elevated white blood cell count, unspecified: Secondary | ICD-10-CM

## 2019-04-19 DIAGNOSIS — Z7901 Long term (current) use of anticoagulants: Secondary | ICD-10-CM | POA: Diagnosis not present

## 2019-04-19 DIAGNOSIS — L309 Dermatitis, unspecified: Secondary | ICD-10-CM | POA: Diagnosis present

## 2019-04-19 LAB — CBC WITH DIFFERENTIAL/PLATELET
Abs Immature Granulocytes: 0.12 10*3/uL — ABNORMAL HIGH (ref 0.00–0.07)
Basophils Absolute: 0.1 10*3/uL (ref 0.0–0.1)
Basophils Relative: 1 %
Eosinophils Absolute: 0.1 10*3/uL (ref 0.0–0.5)
Eosinophils Relative: 0 %
HCT: 21.6 % — ABNORMAL LOW (ref 36.0–46.0)
Hemoglobin: 7.3 g/dL — ABNORMAL LOW (ref 12.0–15.0)
Immature Granulocytes: 1 %
Lymphocytes Relative: 26 %
Lymphs Abs: 5.5 10*3/uL — ABNORMAL HIGH (ref 0.7–4.0)
MCH: 30 pg (ref 26.0–34.0)
MCHC: 33.8 g/dL (ref 30.0–36.0)
MCV: 88.9 fL (ref 80.0–100.0)
Monocytes Absolute: 2.8 10*3/uL — ABNORMAL HIGH (ref 0.1–1.0)
Monocytes Relative: 14 %
Neutro Abs: 12.3 10*3/uL — ABNORMAL HIGH (ref 1.7–7.7)
Neutrophils Relative %: 58 %
Platelets: 631 10*3/uL — ABNORMAL HIGH (ref 150–400)
RBC: 2.43 MIL/uL — ABNORMAL LOW (ref 3.87–5.11)
RDW: 18.7 % — ABNORMAL HIGH (ref 11.5–15.5)
WBC: 21 10*3/uL — ABNORMAL HIGH (ref 4.0–10.5)
nRBC: 0.2 % (ref 0.0–0.2)

## 2019-04-19 LAB — I-STAT BETA HCG BLOOD, ED (MC, WL, AP ONLY): I-stat hCG, quantitative: <5 m[IU]/mL (ref <5)

## 2019-04-19 LAB — COMPREHENSIVE METABOLIC PANEL
ALT: 21 U/L (ref 0–44)
AST: 38 U/L (ref 15–41)
Albumin: 4.8 g/dL (ref 3.5–5.0)
Alkaline Phosphatase: 65 U/L (ref 38–126)
Anion gap: 10 (ref 5–15)
BUN: 14 mg/dL (ref 6–20)
CO2: 20 mmol/L — ABNORMAL LOW (ref 22–32)
Calcium: 9.1 mg/dL (ref 8.9–10.3)
Chloride: 106 mmol/L (ref 98–111)
Creatinine, Ser: 0.53 mg/dL (ref 0.44–1.00)
GFR calc Af Amer: >60 mL/min (ref >60)
GFR calc non Af Amer: >60 mL/min (ref >60)
Glucose, Bld: 95 mg/dL (ref 70–99)
Potassium: 3.7 mmol/L (ref 3.5–5.1)
Sodium: 136 mmol/L (ref 135–145)
Total Bilirubin: 3.6 mg/dL — ABNORMAL HIGH (ref 0.3–1.2)
Total Protein: 8.8 g/dL — ABNORMAL HIGH (ref 6.5–8.1)

## 2019-04-19 LAB — SARS CORONAVIRUS 2 BY RT PCR (HOSPITAL ORDER, PERFORMED IN ~~LOC~~ HOSPITAL LAB): SARS Coronavirus 2: NEGATIVE

## 2019-04-19 LAB — RETICULOCYTES
Immature Retic Fract: 15.6 % (ref 2.3–15.9)
RBC.: 2.43 MIL/uL — ABNORMAL LOW (ref 3.87–5.11)
Retic Count, Absolute: 260.3 10*3/uL — ABNORMAL HIGH (ref 19.0–186.0)
Retic Ct Pct: 10.7 % — ABNORMAL HIGH (ref 0.4–3.1)

## 2019-04-19 LAB — HIV ANTIBODY (ROUTINE TESTING W REFLEX): HIV Screen 4th Generation wRfx: NONREACTIVE

## 2019-04-19 MED ORDER — IOHEXOL 350 MG/ML SOLN
100.0000 mL | Freq: Once | INTRAVENOUS | Status: AC | PRN
  Administered 2019-04-19: 100 mL via INTRAVENOUS

## 2019-04-19 MED ORDER — ONDANSETRON HCL 4 MG/2ML IJ SOLN: 4.0000 mg | Freq: Four times a day (QID) | INTRAMUSCULAR | Status: DC | PRN

## 2019-04-19 MED ORDER — MIRTAZAPINE 30 MG PO TABS
45.0000 mg | ORAL_TABLET | Freq: Every day | ORAL | Status: DC
  Administered 2019-04-19 – 2019-04-25 (×7): 45 mg via ORAL
  Filled 2019-04-19 (×7): qty 1

## 2019-04-19 MED ORDER — VOXELOTOR 500 MG PO TABS: 1500.0000 mg | ORAL_TABLET | Freq: Every day | ORAL | Status: DC

## 2019-04-19 MED ORDER — DIPHENHYDRAMINE HCL 25 MG PO CAPS
25.0000 mg | ORAL_CAPSULE | ORAL | Status: DC | PRN
  Administered 2019-04-20: 25 mg via ORAL
  Filled 2019-04-19 (×2): qty 1

## 2019-04-19 MED ORDER — ONDANSETRON HCL 4 MG/2ML IJ SOLN
4.0000 mg | INTRAMUSCULAR | Status: DC | PRN
  Filled 2019-04-19: qty 2

## 2019-04-19 MED ORDER — KETOROLAC TROMETHAMINE 30 MG/ML IJ SOLN
30.0000 mg | INTRAMUSCULAR | Status: AC
  Administered 2019-04-19: 30 mg via INTRAVENOUS
  Filled 2019-04-19: qty 1

## 2019-04-19 MED ORDER — FOLIC ACID 1 MG PO TABS
1.0000 mg | ORAL_TABLET | Freq: Every day | ORAL | Status: DC
  Administered 2019-04-19 – 2019-04-26 (×8): 1 mg via ORAL
  Filled 2019-04-19 (×8): qty 1

## 2019-04-19 MED ORDER — SODIUM CHLORIDE 0.45 % IV SOLN
INTRAVENOUS | Status: DC
  Administered 2019-04-19: 05:00:00 via INTRAVENOUS

## 2019-04-19 MED ORDER — ALBUTEROL SULFATE (2.5 MG/3ML) 0.083% IN NEBU: 2.5000 mg | INHALATION_SOLUTION | Freq: Two times a day (BID) | RESPIRATORY_TRACT | Status: DC | PRN

## 2019-04-19 MED ORDER — SODIUM CHLORIDE 0.45 % IV SOLN
INTRAVENOUS | Status: DC
  Administered 2019-04-19 – 2019-04-21 (×3): via INTRAVENOUS

## 2019-04-19 MED ORDER — DIPHENHYDRAMINE HCL 50 MG/ML IJ SOLN
25.0000 mg | Freq: Once | INTRAMUSCULAR | Status: AC
  Administered 2019-04-19: 25 mg via INTRAVENOUS
  Filled 2019-04-19: qty 1

## 2019-04-19 MED ORDER — SODIUM CHLORIDE (PF) 0.9 % IJ SOLN
INTRAMUSCULAR | Status: AC
  Filled 2019-04-19: qty 50

## 2019-04-19 MED ORDER — SODIUM CHLORIDE 0.9% FLUSH: 3.0000 mL | Freq: Once | INTRAVENOUS | Status: DC

## 2019-04-19 MED ORDER — POLYETHYLENE GLYCOL 3350 17 G PO PACK: 17.0000 g | PACK | Freq: Every day | ORAL | Status: DC | PRN

## 2019-04-19 MED ORDER — KETOROLAC TROMETHAMINE 15 MG/ML IJ SOLN
15.0000 mg | Freq: Four times a day (QID) | INTRAMUSCULAR | Status: AC
  Administered 2019-04-19 – 2019-04-23 (×19): 15 mg via INTRAVENOUS
  Filled 2019-04-19 (×21): qty 1

## 2019-04-19 MED ORDER — HYDROMORPHONE HCL 2 MG/ML IJ SOLN
2.0000 mg | INTRAMUSCULAR | Status: DC
  Administered 2019-04-19: 2 mg via SUBCUTANEOUS
  Filled 2019-04-19: qty 1

## 2019-04-19 MED ORDER — SODIUM CHLORIDE 0.9% FLUSH: 9.0000 mL | INTRAVENOUS | Status: DC | PRN

## 2019-04-19 MED ORDER — DEFERASIROX 360 MG PO TABS: 1080.0000 mg | ORAL_TABLET | Freq: Every day | ORAL | Status: DC

## 2019-04-19 MED ORDER — RIVAROXABAN 20 MG PO TABS
20.0000 mg | ORAL_TABLET | Freq: Every day | ORAL | Status: DC
  Administered 2019-04-19 – 2019-04-25 (×7): 20 mg via ORAL
  Filled 2019-04-19 (×7): qty 1

## 2019-04-19 MED ORDER — HYDROMORPHONE HCL 1 MG/ML IJ SOLN
1.0000 mg | Freq: Once | INTRAMUSCULAR | Status: AC
  Administered 2019-04-19: 1 mg via INTRAVENOUS
  Filled 2019-04-19: qty 1

## 2019-04-19 MED ORDER — CHLORHEXIDINE GLUCONATE CLOTH 2 % EX PADS
6.0000 | MEDICATED_PAD | Freq: Every day | CUTANEOUS | Status: DC
  Administered 2019-04-20 – 2019-04-26 (×6): 6 via TOPICAL

## 2019-04-19 MED ORDER — SODIUM CHLORIDE 0.9 % IV SOLN
25.0000 mg | INTRAVENOUS | Status: DC | PRN
  Administered 2019-04-19 – 2019-04-26 (×23): 25 mg via INTRAVENOUS
  Filled 2019-04-19: qty 25
  Filled 2019-04-19 (×3): qty 0.5
  Filled 2019-04-19 (×4): qty 25
  Filled 2019-04-19: qty 0.5
  Filled 2019-04-19 (×9): qty 25
  Filled 2019-04-19 (×3): qty 0.5
  Filled 2019-04-19: qty 25
  Filled 2019-04-19: qty 0.5
  Filled 2019-04-19 (×4): qty 25

## 2019-04-19 MED ORDER — SENNOSIDES-DOCUSATE SODIUM 8.6-50 MG PO TABS
1.0000 | ORAL_TABLET | Freq: Two times a day (BID) | ORAL | Status: DC
  Administered 2019-04-20 – 2019-04-26 (×13): 1 via ORAL
  Filled 2019-04-19 (×14): qty 1

## 2019-04-19 MED ORDER — PANTOPRAZOLE SODIUM 40 MG PO TBEC
40.0000 mg | DELAYED_RELEASE_TABLET | Freq: Every day | ORAL | Status: DC
  Administered 2019-04-19 – 2019-04-26 (×8): 40 mg via ORAL
  Filled 2019-04-19 (×8): qty 1

## 2019-04-19 MED ORDER — HYDROMORPHONE 1 MG/ML IV SOLN
INTRAVENOUS | Status: DC
  Administered 2019-04-19: 30 mg via INTRAVENOUS
  Administered 2019-04-19: 0.5 mg via INTRAVENOUS
  Administered 2019-04-19: 3.58 mg via INTRAVENOUS
  Administered 2019-04-20: 0.5 mg via INTRAVENOUS
  Administered 2019-04-20: 0 mg via INTRAVENOUS
  Administered 2019-04-20: 3 mg via INTRAVENOUS
  Administered 2019-04-20: 6 mg via INTRAVENOUS
  Administered 2019-04-20: 2 mg via INTRAVENOUS
  Administered 2019-04-20: 6 mg via INTRAVENOUS
  Administered 2019-04-21: 1 mg via INTRAVENOUS
  Administered 2019-04-21: 3 mg via INTRAVENOUS
  Administered 2019-04-21: 4.5 mg via INTRAVENOUS
  Administered 2019-04-21: 30 mg via INTRAVENOUS
  Filled 2019-04-19 (×2): qty 30

## 2019-04-19 MED ORDER — NALOXONE HCL 0.4 MG/ML IJ SOLN: 0.4000 mg | INTRAMUSCULAR | Status: DC | PRN

## 2019-04-19 MED ORDER — MOMETASONE FURO-FORMOTEROL FUM 100-5 MCG/ACT IN AERO
2.0000 | INHALATION_SPRAY | Freq: Two times a day (BID) | RESPIRATORY_TRACT | Status: DC
  Administered 2019-04-19 – 2019-04-26 (×7): 2 via RESPIRATORY_TRACT
  Filled 2019-04-19: qty 8.8

## 2019-04-19 NOTE — ED Notes (Signed)
Called to give report to the floor, RN unavailable. Awaiting a call back.

## 2019-04-19 NOTE — ED Triage Notes (Signed)
Pt arrived with sickle cell pain in her chest and back that started Friday. Pt states she took her home medications on Saturday, but did not take any today.

## 2019-04-19 NOTE — Discharge Instructions (Addendum)
As we discussed, your CT scan did show an abnormality in the right breast.  This needs follow-up with the breast center.  Sickle cell day infusion center:   M-F [phone], please call at 8 am for triage. We accept patients in the day hospital from 8-12 Monday-Friday    Sickle Cell Anemia, Adult  Sickle cell anemia is a condition where your red blood cells are shaped like sickles. Red blood cells carry oxygen through the body. Sickle-shaped cells do not live as long as normal red blood cells. They also clump together and block blood from flowing through the blood vessels. This prevents the body from getting enough oxygen. Sickle cell anemia causes organ damage and pain. It also increases the risk of infection. Follow these instructions at home: Medicines  Take over-the-counter and prescription medicines only as told by your doctor.  If you were prescribed an antibiotic medicine, take it as told by your doctor. Do not stop taking the antibiotic even if you start to feel better.  If you develop a fever, do not take medicines to lower the fever right away. Tell your doctor about the fever. Managing pain, stiffness, and swelling  Try these methods to help with pain: ? Use a heating pad. ? Take a warm bath. ? Distract yourself, such as by watching TV. Eating and drinking  Drink enough fluid to keep your pee (urine) clear or pale yellow. Drink more in hot weather and during exercise.  Limit or avoid alcohol.  Eat a healthy diet. Eat plenty of fruits, vegetables, whole grains, and lean protein.  Take vitamins and supplements as told by your doctor. Traveling  When traveling, keep these with you: ? Your medical information. ? The names of your doctors. ? Your medicines.  If you need to take an airplane, talk to your doctor first. Activity  Rest often.  Avoid exercises that make your heart beat much faster, such as jogging. General instructions  Do not use products that have  nicotine or tobacco, such as cigarettes and e-cigarettes. If you need help quitting, ask your doctor.  Consider wearing a medical alert bracelet.  Avoid being in high places (high altitudes), such as mountains.  Avoid very hot or cold temperatures.  Avoid places where the temperature changes a lot.  Keep all follow-up visits as told by your doctor. This is important. Contact a doctor if:  A joint hurts.  Your feet or hands hurt or swell.  You feel tired (fatigued). Get help right away if:  You have symptoms of infection. These include: ? Fever. ? Chills. ? Being very tired. ? Irritability. ? Poor eating. ? Throwing up (vomiting).  You feel dizzy or faint.  You have new stomach pain, especially on the left side.  You have a an erection (priapism) that lasts more than 4 hours.  You have numbness in your arms or legs.  You have a hard time moving your arms or legs.  You have trouble talking.  You have pain that does not go away when you take medicine.  You are short of breath.  You are breathing fast.  You have a long-term cough.  You have pain in your chest.  You have a bad headache.  You have a stiff neck.  Your stomach looks bloated even though you did not eat much.  Your skin is pale.  You suddenly cannot see well. Summary  Sickle cell anemia is a condition where your red blood cells are shaped like sickles.  Follow your doctor's advice on ways to manage pain, food to eat, activities to do, and steps to take for safe travel.  Get medical help right away if you have any signs of infection, such as a fever. This information is not intended to replace advice given to you by your health care provider. Make sure you discuss any questions you have with your health care provider. Document Released: 03/24/2013 Document Revised: 09/25/2018 Document Reviewed: 07/09/2016 Elsevier Patient Education  2020 [employer].

## 2019-04-19 NOTE — ED Provider Notes (Signed)
[facility] DEPT Provider Note   CSN: [id number] Arrival date & time: 04/18/19  2352     History   Chief Complaint Chief Complaint  Patient presents with  . Sickle Cell Pain Crisis    HPI Molly Gutierrez is a 38 y.o. female with PMH/o asthma, Eczema, Sickle Cell Anemia who presents for evaluation of sickle cell pain.  Patient reports that for the last 3 days, she has had chest pain and left lower extremity pain.  She was seen here in the ED on 04/17/2019 where she had reassuring work-up.  Patient felt better and was discharged home.  She returns today because she states she continues to have pain.  She reports that at home, she took Dilaudid and Benadryl with no improvement in symptoms.  Patient states she has not had any difficulty breathing and denies any cough.  She did measure a fever of 101.0 at home.  She reports she took Tylenol for fever.  She she states that her chest pain is typical of her sickle cell pain.  She does report that she has had left lower extremity pain before sickle cell crisis is but states that this is more common.  No overlying warmth, erythema, redness.  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  She states she has missed 2 doses.  Denies any abdominal pain, nausea/vomiting, numbness/weakness.     The history is provided by the patient.    Past Medical History:  Diagnosis Date  . Asthma   . Eczema   . Sickle cell anemia (HCC)     There are no active problems to display for this patient.   History reviewed. No pertinent surgical history.   OB History   No obstetric history on file.      Home Medications    Prior to Admission medications   Medication Sig Start Date End Date Taking? Authorizing Provider  albuterol (VENTOLIN HFA) 108 (90 Base) MCG/ACT inhaler Inhale 2 puffs into the lungs 2 (two) times daily as needed for wheezing or shortness of breath. 12/31/16  Yes [provider]  Cholecalciferol  (VITAMIN D3) 25 MCG (1000 UT) CAPS Take 1,000 Units by mouth daily.   Yes [provider]  Deferasirox 360 MG TABS Take 1,080 mg by mouth daily. 09/27/16  Yes [provider]  diphenhydrAMINE (BENADRYL) 25 mg capsule Take 25 mg by mouth 3 (three) times daily as needed for itching.   Yes [provider]  folic acid (FOLVITE) 1 MG tablet Take 1 mg by mouth daily. 12/31/16  Yes [provider]  HYDROmorphone (DILAUDID) 4 MG tablet Take 4 mg by mouth every 3 (three) hours as needed for severe pain.  09/30/18  Yes [provider]  mirtazapine (REMERON) 45 MG tablet Take 45 mg by mouth at bedtime. 09/30/18  Yes [provider]  mometasone-formoterol (DULERA) 100-5 MCG/ACT AERO Inhale 2 puffs into the lungs daily as needed for wheezing or shortness of breath.   Yes [provider]  omeprazole (PRILOSEC) 20 MG capsule Take 20 mg by mouth 2 (two) times a day. 09/08/18  Yes [provider]  promethazine (PHENERGAN) 25 MG tablet Take 25 mg by mouth every 6 (six) hours as needed for nausea/vomiting.   Yes [provider]  vitamin B-12 (CYANOCOBALAMIN) 1000 MCG tablet Take 1,000 mcg by mouth daily.   Yes [provider]  voxelotor (OXBRYTA) 500 MG TABS tablet Take 1,500 mg by mouth daily.  Yes [provider]  XARELTO 20 MG TABS tablet Take 20 mg by mouth daily. 12/16/18  Yes [provider]    Family History No family history on file.  Social History Social History   Tobacco Use  . Smoking status: Never Smoker  . Smokeless tobacco: Never Used  Substance Use Topics  . Alcohol use: Never    Frequency: Never  . Drug use: Never     Allergies   Cefaclor and Hydroxyurea   Review of Systems Review of Systems  Constitutional: Positive for fever.  Respiratory: Negative for cough and shortness of breath.   Cardiovascular: Positive for chest pain.  Gastrointestinal: Negative for abdominal pain,  nausea and vomiting.  Genitourinary: Negative for dysuria and hematuria.  Musculoskeletal:       LLE pain  Neurological: Negative for weakness, numbness and headaches.  All other systems reviewed and are negative.    Physical Exam Updated Vital Signs BP 113/71   Pulse 91   Temp 100 F (37.8 C) (Oral)   Resp 18   LMP 04/06/2019   SpO2 98%   Physical Exam Vitals signs and nursing note reviewed.  Constitutional:      Appearance: Normal appearance. She is well-developed.  HENT:     Head: Normocephalic and atraumatic.  Eyes:     General: Lids are normal.     Conjunctiva/sclera: Conjunctivae normal.     Pupils: Pupils are equal, round, and reactive to light.  Neck:     Musculoskeletal: Full passive range of motion without pain.  Cardiovascular:     Rate and Rhythm: Normal rate and regular rhythm.     Pulses: Normal pulses.          Radial pulses are 2+ on the right side and 2+ on the left side.       Dorsalis pedis pulses are 2+ on the right side and 2+ on the left side.     Heart sounds: Normal heart sounds. No murmur. No friction rub. No gallop.   Pulmonary:     Effort: Pulmonary effort is normal.     Breath sounds: Normal breath sounds.     Comments: Lungs clear to auscultation bilaterally.  Symmetric chest rise.  No wheezing, rales, rhonchi. Chest:     Comments: Tenderness palpation noted anterior chest wall.  No deformity or crepitus noted. Abdominal:     Palpations: Abdomen is soft. Abdomen is not rigid.     Tenderness: There is no abdominal tenderness. There is no guarding.  Musculoskeletal: Normal range of motion.     Comments: Nurse palpation noted to left distal tib-fib.  No overlying warmth, erythema, edema.  No deformity or crepitus noted.  Skin:    General: Skin is warm and dry.     Capillary Refill: Capillary refill takes less than 2 seconds.     Comments: Good distal cap refill.   Neurological:     Mental Status: She is alert and oriented to person,  place, and time.  Psychiatric:        Speech: Speech normal.      ED Treatments / Results  Labs (all labs ordered are listed, but only abnormal results are displayed) Labs Reviewed  COMPREHENSIVE METABOLIC PANEL - Abnormal; Notable for the following components:      Result Value   CO2 20 (*)    Total Protein 8.8 (*)    Total Bilirubin 3.6 (*)    All other components within normal limits  CBC WITH DIFFERENTIAL/PLATELET -  Abnormal; Notable for the following components:   WBC 21.0 (*)    RBC 2.43 (*)    Hemoglobin 7.3 (*)    HCT 21.6 (*)    RDW 18.7 (*)    Platelets 631 (*)    Neutro Abs 12.3 (*)    Lymphs Abs 5.5 (*)    Monocytes Absolute 2.8 (*)    Abs Immature Granulocytes 0.12 (*)    All other components within normal limits  RETICULOCYTES - Abnormal; Notable for the following components:   Retic Ct Pct 10.7 (*)    RBC. 2.43 (*)    Retic Count, Absolute 260.3 (*)    All other components within normal limits  SARS CORONAVIRUS 2 BY RT PCR (HOSPITAL ORDER, [facility])  I-STAT BETA HCG BLOOD, ED ([facility], [facility], [facility] ONLY)    EKG None  Radiology Ct Angio Chest Pe W And/or Wo Contrast  Result Date: 04/19/2019 CLINICAL DATA:  Chest pain EXAM: CT ANGIOGRAPHY CHEST WITH CONTRAST TECHNIQUE: Multidetector CT imaging of the chest was performed using the standard protocol during bolus administration of intravenous contrast. Multiplanar CT image reconstructions and MIPs were obtained to evaluate the vascular anatomy. CONTRAST:  125mL OMNIPAQUE IOHEXOL 350 MG/ML SOLN COMPARISON:  None. FINDINGS: Cardiovascular: Evaluation for pulmonary emboli is significantly limited by respiratory motion artifact.Given this limitation, no pulmonary embolism was detected. The heart size is enlarged. There is no CT evidence for an aortic dissection. There are well-positioned bilateral porta cast. Mediastinum/Nodes: --No mediastinal or hilar lymphadenopathy. --No axillary  lymphadenopathy. --No supraclavicular lymphadenopathy. --Normal thyroid gland. --The esophagus is unremarkable Lungs/Pleura: No pulmonary nodules or masses. No pleural effusion or pneumothorax. No focal airspace consolidation. No focal pleural abnormality. Upper Abdomen: The spleen is heavily calcified consistent with the patient's history of sickle cell disease. Musculoskeletal: There is a well-circumscribed soft tissue mass adjacent to the T9 vertebral body on the right. There is a heterogeneous appearance of virtually all of the visualized osseous structures. There is a complex 2.3 x 1.5 cm right breast mass with coarse peripheral calcifications. There is cortical thinning of the upper pole the right kidney. Review of the MIP images confirms the above findings. IMPRESSION: 1. Evaluation for pulmonary emboli is limited by respiratory motion artifact. Given this limitation, no PE was identified. 2. Sequela of sickle cell disease including evidence for extra medullary hematopoiesis. 3. Complex right breast mass measuring approximately 2.3 x 1.5 cm. Follow-up with a nonemergent outpatient mammogram is recommended. Electronically Signed   By: Constance Holster M.D.   On: 04/19/2019 06:12   Dg Chest Portable 1 View  Result Date: 04/19/2019 CLINICAL DATA:  Sickle cell pain. EXAM: PORTABLE CHEST 1 VIEW COMPARISON:  04/17/2019 FINDINGS: There is stable Port-A-Cath bilaterally. The heart size is unchanged from prior study. Calcified granulomas are noted bilaterally. There is no pneumothorax. No large pleural effusion. No focal infiltrate. IMPRESSION: No active disease. Electronically Signed   By: Constance Holster M.D.   On: 04/19/2019 05:38   Dg Chest Port 1 View  Result Date: 04/17/2019 CLINICAL DATA:  Chest pain. Sickle cell anemia. Asthma. EXAM: PORTABLE CHEST 1 VIEW COMPARISON:  10/02/2018 FINDINGS: Mildly enlarged cardiac silhouette without significant change. Stable bilateral jugular porta catheters.  Clear lungs with normal vascularity. Unremarkable bones. IMPRESSION: Stable mild cardiomegaly. No acute abnormality. Electronically Signed   By: Claudie Revering M.D.   On: 04/17/2019 08:49    Procedures Procedures (including critical care time)  Medications Ordered in ED Medications  sodium  chloride flush (NS) 0.9 % injection 3 mL (has no administration in time range)  0.45 % sodium chloride infusion ( Intravenous New Bag/Given 04/19/19 0505)  ondansetron (ZOFRAN) injection 4 mg (has no administration in time range)  HYDROmorphone (DILAUDID) injection 2 mg (2 mg Subcutaneous Given 04/19/19 0509)  sodium chloride (PF) 0.9 % injection (has no administration in time range)  ketorolac (TORADOL) 30 MG/ML injection 30 mg (30 mg Intravenous Given 04/19/19 0507)  diphenhydrAMINE (BENADRYL) injection 25 mg (25 mg Intravenous Given 04/19/19 0509)  iohexol (OMNIPAQUE) 350 MG/ML injection 100 mL (100 mLs Intravenous Contrast Given 04/19/19 0538)  HYDROmorphone (DILAUDID) injection 1 mg (1 mg Intravenous Given 04/19/19 0612)     Initial Impression / Assessment and Plan / ED Course  I have reviewed the triage vital signs and the nursing notes.  Pertinent labs & imaging results that were available during my care of the patient were reviewed by me and considered in my medical decision making (see chart for details).        38 year old female who presents for evaluation of sickle cell pain.  Reports been ongoing issue.  Seen here in ED on 04/17/2019.  Reports fever at home.  History of PEs and has missed a few doses of her Eliquis.  Initially arrival, she is low-grade temperature and slightly tachycardic.  Vitals otherwise stable.  Consider sickle cell pain crisis.  Doubt acute chest syndrome.  Given that she is having chest pain and has missed Eliquis, will get CTA for evaluation of any PE.  Will plan for labs, chest x-ray.  I-STAT beta negative.  CMP shows normal BUN and creatinine.  CBC shows slight  leukocytosis.  Hemoglobin stable at 7.3.  Reticulocyte count is 10.7.  Chest x-ray negative for any acute infectious etiology.  CTA negative for PE.  They do mention a complex breast mass noted in the right breast.  Recommend nonemergent mammogram.  Discussed results with patient, including findings of breast mass on exam.  Breast exam is done a chaperone present.  Not feel any palpable mass.  No overlying warmth, erythema.  Patient reports she is still having pain.  She will give another dose of pain medication and reassess to determine her disposition.  Patient signed out to Nuala Alpha, PA-C with re-evaluation pending to determine disposition.   Portions of this note were generated with [employer]. Dictation errors may occur despite best attempts at proofreading.   Final Clinical Impressions(s) / ED Diagnoses   Final diagnoses:  Sickle cell pain crisis [facility])  Breast mass in female    ED Discharge Orders    None       Desma Mcgregor 04/19/19 Coleridge, April, MD 04/19/19 [phone]

## 2019-04-19 NOTE — H&P (Addendum)
H&P  Patient Demographics:  Molly Gutierrez, is a 38 y.o. female  MRN: [id number]   DOB - [birth year]  Admit Date - 04/19/2019  Outpatient Primary MD for the patient is System, Provider Not In  Chief Complaint  Patient presents with  . Sickle Cell Pain Crisis      HPI:   Molly Gutierrez  is a 38 y.o. female with a medical history significant for sickle cell disease, chronic pain syndrome, opiate dependence and tolerance, history of mild intermittent asthma, history of anemia of chronic disease, and history of pulmonary embolism on Xarelto presented complaining of chest pain that is consistent with previous sickle cell crisis.  Patient states that she was in her usual state of health until 3 days prior.  She is visiting family in this area from [place].  She noticed that pain intensity was increasing to the point that it was uncontrolled by her home medications.  She attributes current pain crisis to decrease amount of rest and changes in weather.  Current pain intensity is 8/10 primarily to mid chest characterized as constant, aching, and occasionally sharp.  She denies shortness of breath, heart palpitations, dizziness, paresthesias, blurred vision, headache, dysuria, nausea, vomiting, or diarrhea.  Patient denies recent travel, sick contacts, or exposure to COVID-19.  Patient is followed by [facility] hematology consistently.  ER course:  WBCs 21,000, hemoglobin 7.3 (appears to be consistent with patient's baseline), platelets 631.  Creatinine 0.53 and total bilirubin 3.5.  BP 134/89, T 98.9 F, P 90, RR 14, oxygen saturation 96% on room air.  Patient underwent CTA due to complaints of chest pain, no PE identified, sequela of sickle cell disease including evidence for extra medullary hematopoiesis, complex right breast mass measuring 2.3 x 1.5 cm.  Chest x-ray shows no acute cardiopulmonary process.  COVID-19 test negative.  Patient admitted to [facility] for management sickle cell pain  crisis  Review of systems:  In addition to the HPI above, patient reports Review of Systems  Constitutional: Negative.   HENT: Negative.   Eyes: Negative.   Respiratory: Negative.   Cardiovascular: Positive for chest pain.  Gastrointestinal: Negative for constipation, diarrhea, heartburn, nausea and vomiting.  Genitourinary: Negative.   Musculoskeletal: Negative.   Skin: Negative.   Neurological: Negative.  Negative for focal weakness and weakness.  Psychiatric/Behavioral: Negative.     A full 10 point Review of Systems was done, except as stated above, all other Review of Systems were negative.  With Past History of the following :   Past Medical History:  Diagnosis Date  . Asthma   . Eczema   . Sickle cell anemia (HCC)       History reviewed. No pertinent surgical history.   Social History:   Social History   Tobacco Use  . Smoking status: Never Smoker  . Smokeless tobacco: Never Used  Substance Use Topics  . Alcohol use: Never    Frequency: Never     Lives - At home   Family History :   No family history on file.   Home Medications:   Prior to Admission medications   Medication Sig Start Date End Date Taking? Authorizing Provider  albuterol (VENTOLIN HFA) 108 (90 Base) MCG/ACT inhaler Inhale 2 puffs into the lungs 2 (two) times daily as needed for wheezing or shortness of breath. 12/31/16  Yes [provider]  Cholecalciferol (VITAMIN D3) 25 MCG (1000 UT) CAPS Take 1,000 Units by mouth daily.   Yes [provider]  Deferasirox 360 MG  TABS Take 1,080 mg by mouth daily. 09/27/16  Yes [provider]  diphenhydrAMINE (BENADRYL) 25 mg capsule Take 25 mg by mouth 3 (three) times daily as needed for itching.   Yes [provider]  folic acid (FOLVITE) 1 MG tablet Take 1 mg by mouth daily. 12/31/16  Yes [provider]  HYDROmorphone (DILAUDID) 4 MG tablet Take 4 mg by mouth every 3 (three) hours as needed for severe pain.   09/30/18  Yes [provider]  mirtazapine (REMERON) 45 MG tablet Take 45 mg by mouth at bedtime. 09/30/18  Yes [provider]  mometasone-formoterol (DULERA) 100-5 MCG/ACT AERO Inhale 2 puffs into the lungs daily as needed for wheezing or shortness of breath.   Yes [provider]  omeprazole (PRILOSEC) 20 MG capsule Take 20 mg by mouth 2 (two) times a day. 09/08/18  Yes [provider]  promethazine (PHENERGAN) 25 MG tablet Take 25 mg by mouth every 6 (six) hours as needed for nausea/vomiting.   Yes [provider]  vitamin B-12 (CYANOCOBALAMIN) 1000 MCG tablet Take 1,000 mcg by mouth daily.   Yes [provider]  voxelotor (OXBRYTA) 500 MG TABS tablet Take 1,500 mg by mouth daily.   Yes [provider]  XARELTO 20 MG TABS tablet Take 20 mg by mouth daily. 12/16/18  Yes [provider]     Allergies:   Allergies  Allergen Reactions  . Cefaclor Hives and Swelling  . Hydroxyurea Other (See Comments) and Palpitations    Lower blood levels and HR Other reaction(s): Hypotension, Other (See Comments) "it messes me up, it drops my levels and stuff" Lower blood levels and HR "it messes me up, it drops my levels and stuff"      Physical Exam:   Vitals:   Vitals:   04/19/19 0630 04/19/19 0700  BP: 113/71 105/66  Pulse: 91 85  Resp: 18 12  Temp:    SpO2: 98% 96%    Physical Exam: Constitutional: Patient appears well-developed and well-nourished. Not in obvious distress. HENT: Normocephalic, atraumatic, External right and left ear normal. Oropharynx is clear and moist.  Eyes: Conjunctivae and EOM are normal. PERRLA, no scleral icterus. Neck: Normal ROM. Neck supple. No JVD. No tracheal deviation. No thyromegaly. CVS: RRR, S1/S2 +, no murmurs, no gallops, no carotid bruit.  Pulmonary: Effort and breath sounds normal, no stridor, rhonchi, wheezes, rales.  Abdominal: Soft. BS +, no distension, tenderness, rebound or  guarding.  Musculoskeletal: Normal range of motion. No edema and no tenderness.  Lymphadenopathy: No lymphadenopathy noted, cervical, inguinal or axillary Neuro: Alert. Normal reflexes, muscle tone coordination. No cranial nerve deficit. Skin: Skin is warm and dry. No rash noted. Not diaphoretic. No erythema. No pallor. Psychiatric: Normal mood and affect. Behavior, judgment, thought content normal.   Data Review:   CBC Recent Labs  Lab 04/17/19 0800 04/19/19 0341  WBC 16.2* 21.0*  HGB 8.0* 7.3*  HCT 24.2* 21.6*  PLT 585* 631*  MCV 89.6 88.9  MCH 29.6 30.0  MCHC 33.1 33.8  RDW 18.6* 18.7*  LYMPHSABS 4.8* 5.5*  MONOABS 2.3* 2.8*  EOSABS 0.1 0.1  BASOSABS 0.1 0.1   ------------------------------------------------------------------------------------------------------------------  Chemistries  Recent Labs  Lab 04/17/19 0800 04/19/19 0341  NA 136 136  K 3.8 3.7  CL 106 106  CO2 21* 20*  GLUCOSE 91 95  BUN 12 14  CREATININE 0.49 0.53  CALCIUM 9.3 9.1  AST  --  38  ALT  --  21  ALKPHOS  --  65  BILITOT  --  3.6*   ------------------------------------------------------------------------------------------------------------------ estimated creatinine clearance is 79.6 mL/min (by C-G formula based on SCr of 0.53 mg/dL). ------------------------------------------------------------------------------------------------------------------ No results for input(s): TSH, T4TOTAL, T3FREE, THYROIDAB in the last 72 hours.  Invalid input(s): FREET3  Coagulation profile No results for input(s): INR, PROTIME in the last 168 hours. ------------------------------------------------------------------------------------------------------------------- No results for input(s): DDIMER in the last 72 hours. -------------------------------------------------------------------------------------------------------------------  Cardiac Enzymes No results for input(s): CKMB, TROPONINI, MYOGLOBIN in  the last 168 hours.  Invalid input(s): CK ------------------------------------------------------------------------------------------------------------------ No results found for: BNP  ---------------------------------------------------------------------------------------------------------------  Urinalysis No results found for: COLORURINE, APPEARANCEUR, LABSPEC, PHURINE, GLUCOSEU, HGBUR, BILIRUBINUR, KETONESUR, PROTEINUR, UROBILINOGEN, NITRITE, LEUKOCYTESUR  ----------------------------------------------------------------------------------------------------------------   Imaging Results:    Ct Angio Chest Pe W And/or Wo Contrast  Result Date: 04/19/2019 CLINICAL DATA:  Chest pain EXAM: CT ANGIOGRAPHY CHEST WITH CONTRAST TECHNIQUE: Multidetector CT imaging of the chest was performed using the standard protocol during bolus administration of intravenous contrast. Multiplanar CT image reconstructions and MIPs were obtained to evaluate the vascular anatomy. CONTRAST:  138mL OMNIPAQUE IOHEXOL 350 MG/ML SOLN COMPARISON:  None. FINDINGS: Cardiovascular: Evaluation for pulmonary emboli is significantly limited by respiratory motion artifact.Given this limitation, no pulmonary embolism was detected. The heart size is enlarged. There is no CT evidence for an aortic dissection. There are well-positioned bilateral porta cast. Mediastinum/Nodes: --No mediastinal or hilar lymphadenopathy. --No axillary lymphadenopathy. --No supraclavicular lymphadenopathy. --Normal thyroid gland. --The esophagus is unremarkable Lungs/Pleura: No pulmonary nodules or masses. No pleural effusion or pneumothorax. No focal airspace consolidation. No focal pleural abnormality. Upper Abdomen: The spleen is heavily calcified consistent with the patient's history of sickle cell disease. Musculoskeletal: There is a well-circumscribed soft tissue mass adjacent to the T9 vertebral body on the right. There is a heterogeneous appearance of  virtually all of the visualized osseous structures. There is a complex 2.3 x 1.5 cm right breast mass with coarse peripheral calcifications. There is cortical thinning of the upper pole the right kidney. Review of the MIP images confirms the above findings. IMPRESSION: 1. Evaluation for pulmonary emboli is limited by respiratory motion artifact. Given this limitation, no PE was identified. 2. Sequela of sickle cell disease including evidence for extra medullary hematopoiesis. 3. Complex right breast mass measuring approximately 2.3 x 1.5 cm. Follow-up with a nonemergent outpatient mammogram is recommended. Electronically Signed   By: Constance Holster M.D.   On: 04/19/2019 06:12   Dg Chest Portable 1 View  Result Date: 04/19/2019 CLINICAL DATA:  Sickle cell pain. EXAM: PORTABLE CHEST 1 VIEW COMPARISON:  04/17/2019 FINDINGS: There is stable Port-A-Cath bilaterally. The heart size is unchanged from prior study. Calcified granulomas are noted bilaterally. There is no pneumothorax. No large pleural effusion. No focal infiltrate. IMPRESSION: No active disease. Electronically Signed   By: Constance Holster M.D.   On: 04/19/2019 05:38      Assessment & Plan:  Principal Problem:   Sickle cell pain crisis ([place]) Active Problems:   Leukocytosis   Chronic pain syndrome   History of pulmonary embolism   Hb Sickle Cell Disease with crisis:  Admit.  Continue 0.45% saline at 100 mL/h Weightbase Dilaudid PCA with settings of 0.5 mg, 10-minute lockout, 3 mg/h and 1 mg loading dose. Toradol 15 mg IV every 6 hours for a total of 5 days supplemental oxygen as needed Patient will be reevaluated frequently for pain intensity in context of functioning and relationship to baseline as her care progresses.  Leukocytosis:  WBCs 21,000.  Patient afebrile, no signs of infection or inflammation.  Continue to monitor closely.  Repeat CBC in a.m.  Chronic pain syndrome: Hold Dilaudid by mouth, use PCA  substitute  History of PE: Continue Xarelto  Sickle cell anemia:  Hemoglobin 7.3, consistent with patient's baseline.  No clinical indication for blood transfusion at this time Folic acid 1 mg daily and Voxelotor 1500 mg daily  Mild intermittent asthma: Albuterol nebulizer twice daily as needed  Right breast mass:  CTA showed complex right breast mass measuring approximately 2.3 x 1.5 cm.  Nonemergent outpatient mammogram recommended.  DVT Prophylaxis: Continue Xarelto and SCDs  AM Labs Ordered, also please review Full Orders  Family Communication: Admission, patient's condition and plan of care including tests being ordered have been discussed with the patient who indicate understanding and agree with the plan and Code Status.  Code Status: Full Code  Consults called: None    Admission status: Inpatient    Time spent in minutes : 50 minutes   [place], MSN, FNP-C [facility] [address] [place], [place] [address] [phone]  04/19/2019 at 8:54 AM

## 2019-04-19 NOTE — ED Notes (Signed)
ED Provider at bedside. 

## 2019-04-19 NOTE — Progress Notes (Signed)
Received report from nurse in ED, Morey Hummingbird.  Awaiting patient to arrive to Room 1602.

## 2019-04-19 NOTE — ED Provider Notes (Signed)
Care handoff received from Providence Lanius, PA-C at shift change please see her note for full details of visit.  In short 38 year old female with history of sickle cell anemia presents today with complaint of pain.  3 days of pain, reassuring work-up thus far.  Plan of care is likely admission for pain control after reassessment.  Beta-hCG negative COVID-19 negative CBC with leukocytosis of 21.0 with left shift, hemoglobin 7.3, both appear near baseline for patient CMP nonacute Reticulocytes near baseline Chest x-ray:  IMPRESSION:  No active disease.   CT Angio PE Study:  IMPRESSION:  1. Evaluation for pulmonary emboli is limited by respiratory motion  artifact. Given this limitation, no PE was identified.  2. Sequela of sickle cell disease including evidence for extra  medullary hematopoiesis.  3. Complex right breast mass measuring approximately 2.3 x 1.5 cm.  Follow-up with a nonemergent outpatient mammogram is recommended.   EKG: Without acute findings reviewed by Dr. Zenia Resides.  So far patient has received 2 rounds of Dilaudid, 25 mg Benadryl x2, 30 mg Toradol. --- Patient informed by previous provider of right breast mass and need for outpatient mammogram. Physical Exam  BP 105/66   Pulse 85   Temp 100 F (37.8 C) (Oral)   Resp 12   LMP 04/06/2019   SpO2 96%   Physical Exam Constitutional:      General: She is not in acute distress.    Appearance: Normal appearance. She is well-developed. She is not ill-appearing or diaphoretic.  HENT:     Head: Normocephalic and atraumatic.     Right Ear: External ear normal.     Left Ear: External ear normal.     Nose: Nose normal.  Eyes:     General: Vision grossly intact. Gaze aligned appropriately.     Pupils: Pupils are equal, round, and reactive to light.  Neck:     Musculoskeletal: Normal range of motion.     Trachea: Trachea and phonation normal. No tracheal deviation.  Pulmonary:     Effort: Pulmonary effort is normal. No  respiratory distress.  Musculoskeletal: Normal range of motion.  Skin:    General: Skin is warm and dry.  Neurological:     Mental Status: She is alert.     GCS: GCS eye subscore is 4. GCS verbal subscore is 5. GCS motor subscore is 6.     Comments: Speech is clear and goal oriented, follows commands Major Cranial nerves without deficit, no facial droop Moves extremities without ataxia, coordination intact  Psychiatric:        Behavior: Behavior normal.     ED Course/Procedures     Procedures  MDM  Patient evaluated resting comfortably in bed no acute distress.  She reports that her pain is ongoing and requests more Dilaudid.  Consult called to hospitalist for admission/pain control. - Discussed case with Smith Robert NP who is seeing patient for admission.  Patient's case discussed with Dr. Zenia Resides during this visit.  Note: Portions of this report may have been transcribed using voice recognition software. Every effort was made to ensure accuracy; however, inadvertent computerized transcription errors may still be present.   Gari Crown 04/19/19 [id number]    Palumbo, April, MD 04/27/19 [id number]

## 2019-04-20 DIAGNOSIS — Z86711 Personal history of pulmonary embolism: Secondary | ICD-10-CM | POA: Diagnosis not present

## 2019-04-20 DIAGNOSIS — N63 Unspecified lump in unspecified breast: Secondary | ICD-10-CM | POA: Diagnosis not present

## 2019-04-20 DIAGNOSIS — G894 Chronic pain syndrome: Secondary | ICD-10-CM | POA: Diagnosis not present

## 2019-04-20 LAB — BASIC METABOLIC PANEL
Anion gap: 5 (ref 5–15)
BUN: 8 mg/dL (ref 6–20)
CO2: 20 mmol/L — ABNORMAL LOW (ref 22–32)
Calcium: 8.4 mg/dL — ABNORMAL LOW (ref 8.9–10.3)
Chloride: 111 mmol/L (ref 98–111)
Creatinine, Ser: 0.56 mg/dL (ref 0.44–1.00)
GFR calc Af Amer: >60 mL/min (ref >60)
GFR calc non Af Amer: >60 mL/min (ref >60)
Glucose, Bld: 119 mg/dL — ABNORMAL HIGH (ref 70–99)
Potassium: 3.9 mmol/L (ref 3.5–5.1)
Sodium: 136 mmol/L (ref 135–145)

## 2019-04-20 LAB — CBC
HCT: 17.9 % — ABNORMAL LOW (ref 36.0–46.0)
Hemoglobin: 6.1 g/dL — CL (ref 12.0–15.0)
MCH: 30.5 pg (ref 26.0–34.0)
MCHC: 34.1 g/dL (ref 30.0–36.0)
MCV: 89.5 fL (ref 80.0–100.0)
Platelets: 607 10*3/uL — ABNORMAL HIGH (ref 150–400)
RBC: 2 MIL/uL — ABNORMAL LOW (ref 3.87–5.11)
RDW: 19.6 % — ABNORMAL HIGH (ref 11.5–15.5)
WBC: 19 10*3/uL — ABNORMAL HIGH (ref 4.0–10.5)
nRBC: 0.3 % — ABNORMAL HIGH (ref 0.0–0.2)

## 2019-04-20 LAB — PREPARE RBC (CROSSMATCH)

## 2019-04-20 MED ORDER — OXYCODONE HCL 5 MG PO TABS
10.0000 mg | ORAL_TABLET | ORAL | Status: DC | PRN
  Administered 2019-04-21 – 2019-04-22 (×2): 10 mg via ORAL
  Filled 2019-04-20 (×3): qty 2

## 2019-04-20 MED ORDER — SODIUM CHLORIDE 0.9% IV SOLUTION
Freq: Once | INTRAVENOUS | Status: AC
  Administered 2019-04-21: 01:00:00 via INTRAVENOUS

## 2019-04-20 MED ORDER — HYDROCERIN EX CREA
TOPICAL_CREAM | Freq: Two times a day (BID) | CUTANEOUS | Status: DC
  Administered 2019-04-20 – 2019-04-26 (×11): via TOPICAL
  Filled 2019-04-20: qty 113

## 2019-04-20 MED ORDER — DIPHENHYDRAMINE HCL 50 MG/ML IJ SOLN
25.0000 mg | Freq: Once | INTRAMUSCULAR | Status: AC
  Administered 2019-04-21: 25 mg via INTRAVENOUS
  Filled 2019-04-20: qty 1

## 2019-04-20 MED ORDER — ACETAMINOPHEN 325 MG PO TABS
650.0000 mg | ORAL_TABLET | Freq: Once | ORAL | Status: AC
  Administered 2019-04-21: 650 mg via ORAL
  Filled 2019-04-20: qty 2

## 2019-04-20 NOTE — Progress Notes (Signed)
Blood bank contacted to determine if blood is ready. Blood had to be ordered and has just arrived. Blood bank will call when blood is ready

## 2019-04-20 NOTE — Progress Notes (Signed)
Subjective: [facility], a 38 year old female with a medical history significant for sickle cell disease, chronic pain syndrome, opiate dependence and tolerance, history of mild intermittent asthma, history of anemia of chronic disease, and history of pulmonary embolism on Xarelto was admitted for sickle cell pain crisis.  Patient states the pain intensity has not improved overnight.  Pain is primarily to mid chest characterized as constant, aching, and occasionally sharp.  Patient's pain persists despite IV Dilaudid PCA.  Patient is also complaining of intense itching.  She reports a history of eczema and states that she has scratched her skin to the point of bleeding itching has been unrelieved by oral Benadryl.  Patient's hemoglobin is decreased to 6.1 today, which is below patient's baseline.  Patient endorses fatigue.  She says that it is been difficult to mobilize in her room without assistance. Patient denies headache, dizziness, shortness of breath, heart palpitations, dysuria, nausea, vomiting, or diarrhea.  Objective:  Vital signs in last 24 hours:  Vitals:   04/20/19 1445 04/20/19 1730 04/20/19 1812 04/20/19 2026  BP:  101/68    Pulse:  84    Resp: 13 14 14 14   Temp:  99.2 F (37.3 C)    TempSrc:  Oral    SpO2: 94% 94% 92% 94%  Weight:      Height:        Intake/Output from previous day:   Intake/Output Summary (Last 24 hours) at 04/20/2019 2033 Last data filed at 04/20/2019 1830 Gross per 24 hour  Intake 1848 ml  Output -  Net 1848 ml    Physical Exam: General: Alert, awake, oriented x3, in no acute distress.  HEENT: [place]/AT PEERL, EOMI Neck: Trachea midline,  no masses, no thyromegal,y no JVD, no carotid bruit OROPHARYNX:  Moist, No exudate/ erythema/lesions.  Heart: Regular rate and rhythm, without murmurs, rubs, gallops, PMI non-displaced, no heaves or thrills on palpation.  Lungs: Clear to auscultation, no wheezing or rhonchi noted. No increased vocal  fremitus resonant to percussion  Abdomen: Soft, nontender, nondistended, positive bowel sounds, no masses no hepatosplenomegaly noted..  Neuro: No focal neurological deficits noted cranial nerves II through XII grossly intact. DTRs 2+ bilaterally upper and lower extremities. Strength 5 out of 5 in bilateral upper and lower extremities. Musculoskeletal: No warm swelling or erythema around joints, no spinal tenderness noted. Psychiatric: Patient alert and oriented x3, good insight and cognition, good recent to remote recall. Lymph node survey: No cervical axillary or inguinal lymphadenopathy noted.  Lab Results:  Basic Metabolic Panel:    Component Value Date/Time   NA 136 04/20/2019 0522   K 3.9 04/20/2019 0522   CL 111 04/20/2019 0522   CO2 20 (L) 04/20/2019 0522   BUN 8 04/20/2019 0522   CREATININE 0.56 04/20/2019 0522   GLUCOSE 119 (H) 04/20/2019 0522   CALCIUM 8.4 (L) 04/20/2019 0522   CBC:    Component Value Date/Time   WBC 19.0 (H) 04/20/2019 0522   HGB 6.1 (LL) 04/20/2019 0522   HCT 17.9 (L) 04/20/2019 0522   PLT 607 (H) 04/20/2019 0522   MCV 89.5 04/20/2019 0522   NEUTROABS 12.3 (H) 04/19/2019 0341   LYMPHSABS 5.5 (H) 04/19/2019 0341   MONOABS 2.8 (H) 04/19/2019 0341   EOSABS 0.1 04/19/2019 0341   BASOSABS 0.1 04/19/2019 0341    Recent Results (from the past 240 hour(s))  SARS Coronavirus 2 by RT PCR (hospital order, performed in [place] hospital lab) Nasopharyngeal Nasopharyngeal Swab     Status: None  Collection Time: 04/19/19  5:01 AM   Specimen: Nasopharyngeal Swab  Result Value Ref Range Status   SARS Coronavirus 2 NEGATIVE NEGATIVE Final    Comment: (NOTE) If result is NEGATIVE SARS-CoV-2 target nucleic acids are NOT DETECTED. The SARS-CoV-2 RNA is generally detectable in upper and lower  respiratory specimens during the acute phase of infection. The lowest  concentration of SARS-CoV-2 viral copies this assay can detect is 250  copies / mL. A negative  result does not preclude SARS-CoV-2 infection  and should not be used as the sole basis for treatment or other  patient management decisions.  A negative result may occur with  improper specimen collection / handling, submission of specimen other  than nasopharyngeal swab, presence of viral mutation(s) within the  areas targeted by this assay, and inadequate number of viral copies  (<250 copies / mL). A negative result must be combined with clinical  observations, patient history, and epidemiological information. If result is POSITIVE SARS-CoV-2 target nucleic acids are DETECTED. The SARS-CoV-2 RNA is generally detectable in upper and lower  respiratory specimens dur ing the acute phase of infection.  Positive  results are indicative of active infection with SARS-CoV-2.  Clinical  correlation with patient history and other diagnostic information is  necessary to determine patient infection status.  Positive results do  not rule out bacterial infection or co-infection with other viruses. If result is PRESUMPTIVE POSTIVE SARS-CoV-2 nucleic acids MAY BE PRESENT.   A presumptive positive result was obtained on the submitted specimen  and confirmed on repeat testing.  While 2019 novel coronavirus  (SARS-CoV-2) nucleic acids may be present in the submitted sample  additional confirmatory testing may be necessary for epidemiological  and / or clinical management purposes  to differentiate between  SARS-CoV-2 and other Sarbecovirus currently known to infect humans.  If clinically indicated additional testing with an alternate test  methodology [phone]) is advised. The SARS-CoV-2 RNA is generally  detectable in upper and lower respiratory sp ecimens during the acute  phase of infection. The expected result is Negative. Fact Sheet for Patients:  StrictlyIdeas.no Fact Sheet for Healthcare Providers: BankingDealers.co.za This test is not yet  approved or cleared by the [place] FDA and has been authorized for detection and/or diagnosis of SARS-CoV-2 by FDA under an Emergency Use Authorization (EUA).  This EUA will remain in effect (meaning this test can be used) for the duration of the COVID-19 declaration under Section 564(b)(1) of the Act, 21 U.S.C. section 360bbb-3(b)(1), unless the authorization is terminated or revoked sooner. Performed at [facility], [place] [address] [place], [place] [address]     Studies/Results: Ct Angio Chest Pe W And/or Wo Contrast  Result Date: 04/19/2019 CLINICAL DATA:  Chest pain EXAM: CT ANGIOGRAPHY CHEST WITH CONTRAST TECHNIQUE: Multidetector CT imaging of the chest was performed using the standard protocol during bolus administration of intravenous contrast. Multiplanar CT image reconstructions and MIPs were obtained to evaluate the vascular anatomy. CONTRAST:  117mL OMNIPAQUE IOHEXOL 350 MG/ML SOLN COMPARISON:  None. FINDINGS: Cardiovascular: Evaluation for pulmonary emboli is significantly limited by respiratory motion artifact.Given this limitation, no pulmonary embolism was detected. The heart size is enlarged. There is no CT evidence for an aortic dissection. There are well-positioned bilateral porta cast. Mediastinum/Nodes: --No mediastinal or hilar lymphadenopathy. --No axillary lymphadenopathy. --No supraclavicular lymphadenopathy. --Normal thyroid gland. --The esophagus is unremarkable Lungs/Pleura: No pulmonary nodules or masses. No pleural effusion or pneumothorax. No focal airspace consolidation. No focal pleural abnormality. Upper  Abdomen: The spleen is heavily calcified consistent with the patient's history of sickle cell disease. Musculoskeletal: There is a well-circumscribed soft tissue mass adjacent to the T9 vertebral body on the right. There is a heterogeneous appearance of virtually all of the visualized osseous structures. There is a complex 2.3 x 1.5 cm right  breast mass with coarse peripheral calcifications. There is cortical thinning of the upper pole the right kidney. Review of the MIP images confirms the above findings. IMPRESSION: 1. Evaluation for pulmonary emboli is limited by respiratory motion artifact. Given this limitation, no PE was identified. 2. Sequela of sickle cell disease including evidence for extra medullary hematopoiesis. 3. Complex right breast mass measuring approximately 2.3 x 1.5 cm. Follow-up with a nonemergent outpatient mammogram is recommended. Electronically Signed   By: Constance Holster M.D.   On: 04/19/2019 06:12   Dg Chest Portable 1 View  Result Date: 04/19/2019 CLINICAL DATA:  Sickle cell pain. EXAM: PORTABLE CHEST 1 VIEW COMPARISON:  04/17/2019 FINDINGS: There is stable Port-A-Cath bilaterally. The heart size is unchanged from prior study. Calcified granulomas are noted bilaterally. There is no pneumothorax. No large pleural effusion. No focal infiltrate. IMPRESSION: No active disease. Electronically Signed   By: Constance Holster M.D.   On: 04/19/2019 05:38    Medications: Scheduled Meds: . sodium chloride   Intravenous Once  . acetaminophen  650 mg Oral Once  . Chlorhexidine Gluconate Cloth  6 each Topical Daily  . Deferasirox  1,080 mg Oral Daily  . diphenhydrAMINE  25 mg Intravenous Once  . folic acid  1 mg Oral Daily  . hydrocerin   Topical BID  . HYDROmorphone   Intravenous Q4H  . ketorolac  15 mg Intravenous Q6H  . mirtazapine  45 mg Oral QHS  . mometasone-formoterol  2 puff Inhalation BID  . pantoprazole  40 mg Oral Daily  . rivaroxaban  20 mg Oral Q supper  . senna-docusate  1 tablet Oral BID  . sodium chloride flush  3 mL Intravenous Once  . voxelotor  1,500 mg Oral Daily   Continuous Infusions: . sodium chloride 50 mL/hr (04/20/19 0955)  . diphenhydrAMINE 25 mg (04/20/19 1207)   PRN Meds:.albuterol, diphenhydrAMINE, naloxone **AND** sodium chloride flush, ondansetron (ZOFRAN) IV, oxyCODONE,  polyethylene glycol  Consultants:  None  Procedures:  None  Antibiotics:  None  Assessment/Plan: Principal Problem:   Sickle cell pain crisis (HCC) Active Problems:   Leukocytosis   Chronic pain syndrome   History of pulmonary embolism  Sickle cell disease with pain crisis: Continue weight-based Dilaudid PCA, no change in settings on today. Continue IV fluids, 0.45% saline at 50 mL/h Toradol 15 mg IV every 6 hours for a total of 5 days Supplemental oxygen as needed Patient will be reevaluated frequently for pain intensity in the context of functioning and relationship to baseline as her care progresses  Leukocytosis: WBCs improved today.  Patient remains afebrile.  No signs of infection or inflammation.  No antibiotics warranted at this time.  Continue to  monitor closely. Chronic pain syndrome: Continue to hold oral Dilaudid, use PCA as substitute  History of PE: Continue Xarelto  Sickle cell anemia: Hemoglobin decreased to 6.1.  Hemoglobin is below patient's baseline.  Transfuse 1 unit of PRBCs.  CBC in a.m.   Mild intermittent asthma: Albuterol nebulizer twice daily as needed  Code Status: Full Code Family Communication: N/A Disposition Plan: Not yet ready for discharge [place] Al [place]  APRN, MSN, FNP-C Patient [facility] [place], [place] [address] [phone]  If 5PM-7AM, please contact night-coverage.  04/20/2019, 8:33 PM  LOS: 1 day

## 2019-04-20 NOTE — Progress Notes (Signed)
Patient instructed to put CO2 monitor on. Patient states that she does not want to put monitor on right now. Patient instructed that all monitors are required to continue with PCA. CO2 monitor currently on. Will continue to monitor patient.

## 2019-04-20 NOTE — Progress Notes (Signed)
CRITICAL VALUE ALERT  Critical Value:  6.1  Date & Time Notied:  11/3  Provider Notified: yes  Orders Received/Actions taken:

## 2019-04-21 DIAGNOSIS — N63 Unspecified lump in unspecified breast: Secondary | ICD-10-CM | POA: Diagnosis not present

## 2019-04-21 DIAGNOSIS — D5700 Hb-SS disease with crisis, unspecified: Secondary | ICD-10-CM | POA: Diagnosis not present

## 2019-04-21 DIAGNOSIS — G894 Chronic pain syndrome: Secondary | ICD-10-CM | POA: Diagnosis not present

## 2019-04-21 LAB — HEMOGLOBIN AND HEMATOCRIT, BLOOD
HCT: 22.2 % — ABNORMAL LOW (ref 36.0–46.0)
Hemoglobin: 7.4 g/dL — ABNORMAL LOW (ref 12.0–15.0)

## 2019-04-21 MED ORDER — HYDROMORPHONE 1 MG/ML IV SOLN
INTRAVENOUS | Status: DC
  Administered 2019-04-21: 5 mg via INTRAVENOUS
  Administered 2019-04-21: 2 mg via INTRAVENOUS
  Administered 2019-04-21: 4 mg via INTRAVENOUS
  Administered 2019-04-22: 2.5 mg via INTRAVENOUS
  Administered 2019-04-22: 30 mg via INTRAVENOUS
  Administered 2019-04-22: 3 mg via INTRAVENOUS
  Administered 2019-04-22: 5.5 mg via INTRAVENOUS
  Administered 2019-04-22: 1.5 mg via INTRAVENOUS
  Administered 2019-04-22: 3.5 mg via INTRAVENOUS
  Administered 2019-04-23: 0.5 mg via INTRAVENOUS
  Administered 2019-04-23 (×2): 3.5 mg via INTRAVENOUS
  Filled 2019-04-21 (×2): qty 30

## 2019-04-21 NOTE — Progress Notes (Signed)
Subjective: [facility], a 38 year old female with a medical history significant for sickle cell disease, chronic pain syndrome, opiate dependence and tolerance, history of PE on Xarelto, and history of mild intermittent asthma was admitted in sickle cell pain crisis.  Pain is localized to mid chest characterized as constant and aching.  Current pain intensity is 7/10.  Patient cannot manage at home at this time at current pain level.  Goal is 3-4/10.  Patient denies headache, dizziness, paresthesias, shortness of breath, persistent cough, dysuria, nausea, vomiting, or diarrhea.  Objective:  Vital signs in last 24 hours:  Vitals:   04/21/19 0512 04/21/19 0606 04/21/19 0800 04/21/19 0926  BP:  111/72  116/80  Pulse:  89  (!) 51  Resp:  10 11 17   Temp:  99.6 F (37.6 C)  99.8 F (37.7 C)  TempSrc:  Oral  Oral  SpO2:  91% 92% 93%  Weight: 52.3 kg     Height:        Intake/Output from previous day:   Intake/Output Summary (Last 24 hours) at 04/21/2019 1119 Last data filed at 04/21/2019 0800 Gross per 24 hour  Intake 2750.88 ml  Output -  Net 2750.88 ml    Physical Exam: General: Alert, awake, oriented x3, in no acute distress.  HEENT: [place]/AT PEERL, EOMI Neck: Trachea midline,  no masses, no thyromegal,y no JVD, no carotid bruit OROPHARYNX:  Moist, No exudate/ erythema/lesions.  Heart: Regular rate and rhythm, without murmurs, rubs, gallops, PMI non-displaced, no heaves or thrills on palpation.  Lungs: Clear to auscultation, no wheezing or rhonchi noted. No increased vocal fremitus resonant to percussion  Abdomen: Soft, nontender, nondistended, positive bowel sounds, no masses no hepatosplenomegaly noted..  Neuro: No focal neurological deficits noted cranial nerves II through XII grossly intact. DTRs 2+ bilaterally upper and lower extremities. Strength 5 out of 5 in bilateral upper and lower extremities. Musculoskeletal: No warm swelling or erythema around joints, no  spinal tenderness noted. Psychiatric: Patient alert and oriented x3, good insight and cognition, good recent to remote recall. Lymph node survey: No cervical axillary or inguinal lymphadenopathy noted.  Lab Results:  Basic Metabolic Panel:    Component Value Date/Time   NA 136 04/20/2019 0522   K 3.9 04/20/2019 0522   CL 111 04/20/2019 0522   CO2 20 (L) 04/20/2019 0522   BUN 8 04/20/2019 0522   CREATININE 0.56 04/20/2019 0522   GLUCOSE 119 (H) 04/20/2019 0522   CALCIUM 8.4 (L) 04/20/2019 0522   CBC:    Component Value Date/Time   WBC 19.0 (H) 04/20/2019 0522   HGB 7.4 (L) 04/21/2019 0551   HCT 22.2 (L) 04/21/2019 0551   PLT 607 (H) 04/20/2019 0522   MCV 89.5 04/20/2019 0522   NEUTROABS 12.3 (H) 04/19/2019 0341   LYMPHSABS 5.5 (H) 04/19/2019 0341   MONOABS 2.8 (H) 04/19/2019 0341   EOSABS 0.1 04/19/2019 0341   BASOSABS 0.1 04/19/2019 0341    Recent Results (from the past 240 hour(s))  SARS Coronavirus 2 by RT PCR (hospital order, performed in [place] hospital lab) Nasopharyngeal Nasopharyngeal Swab     Status: None   Collection Time: 04/19/19  5:01 AM   Specimen: Nasopharyngeal Swab  Result Value Ref Range Status   SARS Coronavirus 2 NEGATIVE NEGATIVE Final    Comment: (NOTE) If result is NEGATIVE SARS-CoV-2 target nucleic acids are NOT DETECTED. The SARS-CoV-2 RNA is generally detectable in upper and lower  respiratory specimens during the acute phase of infection. The lowest  concentration of SARS-CoV-2 viral copies this assay can detect is 250  copies / mL. A negative result does not preclude SARS-CoV-2 infection  and should not be used as the sole basis for treatment or other  patient management decisions.  A negative result may occur with  improper specimen collection / handling, submission of specimen other  than nasopharyngeal swab, presence of viral mutation(s) within the  areas targeted by this assay, and inadequate number of viral copies  (<250 copies /  mL). A negative result must be combined with clinical  observations, patient history, and epidemiological information. If result is POSITIVE SARS-CoV-2 target nucleic acids are DETECTED. The SARS-CoV-2 RNA is generally detectable in upper and lower  respiratory specimens dur ing the acute phase of infection.  Positive  results are indicative of active infection with SARS-CoV-2.  Clinical  correlation with patient history and other diagnostic information is  necessary to determine patient infection status.  Positive results do  not rule out bacterial infection or co-infection with other viruses. If result is PRESUMPTIVE POSTIVE SARS-CoV-2 nucleic acids MAY BE PRESENT.   A presumptive positive result was obtained on the submitted specimen  and confirmed on repeat testing.  While 2019 novel coronavirus  (SARS-CoV-2) nucleic acids may be present in the submitted sample  additional confirmatory testing may be necessary for epidemiological  and / or clinical management purposes  to differentiate between  SARS-CoV-2 and other Sarbecovirus currently known to infect humans.  If clinically indicated additional testing with an alternate test  methodology [phone]) is advised. The SARS-CoV-2 RNA is generally  detectable in upper and lower respiratory sp ecimens during the acute  phase of infection. The expected result is Negative. Fact Sheet for Patients:  StrictlyIdeas.no Fact Sheet for Healthcare Providers: BankingDealers.co.za This test is not yet approved or cleared by the [place] FDA and has been authorized for detection and/or diagnosis of SARS-CoV-2 by FDA under an Emergency Use Authorization (EUA).  This EUA will remain in effect (meaning this test can be used) for the duration of the COVID-19 declaration under Section 564(b)(1) of the Act, 21 U.S.C. section 360bbb-3(b)(1), unless the authorization is terminated or revoked  sooner. Performed at [facility], [place] [address] [place], [place] [address]     Studies/Results: No results found.  Medications: Scheduled Meds: . Chlorhexidine Gluconate Cloth  6 each Topical Daily  . Deferasirox  1,080 mg Oral Daily  . folic acid  1 mg Oral Daily  . hydrocerin   Topical BID  . HYDROmorphone   Intravenous Q4H  . ketorolac  15 mg Intravenous Q6H  . mirtazapine  45 mg Oral QHS  . mometasone-formoterol  2 puff Inhalation BID  . pantoprazole  40 mg Oral Daily  . rivaroxaban  20 mg Oral Q supper  . senna-docusate  1 tablet Oral BID  . sodium chloride flush  3 mL Intravenous Once  . voxelotor  1,500 mg Oral Daily   Continuous Infusions: . diphenhydrAMINE Stopped (04/21/19 0659)   PRN Meds:.albuterol, diphenhydrAMINE, naloxone **AND** sodium chloride flush, ondansetron (ZOFRAN) IV, oxyCODONE, polyethylene glycol  Consultants:  None  Procedures:  None  Antibiotics:  None  Assessment/Plan: Principal Problem:   Sickle cell pain crisis (HCC) Active Problems:   Leukocytosis   Chronic pain syndrome   History of pulmonary embolism   Breast mass in female  Sickle cell disease with pain crisis: Discontinue IV fluids Weaning weight-based Dilaudid PCA.  Settings changed to 0.5 mg, 10-minute lockout, and 2  mg/h. Oxycodone 10 mg every 4 hours as needed for severe breakthrough pain Continue Toradol 15 mg IV every 6 hours for total of 5 days Supplemental oxygen as needed, maintain oxygen saturation above 90%  Leukocytosis: WBC stable.  Patient remains afebrile.  No signs of infection or inflammation.  No antibiotics warranted at this time.  Continue to monitor closely.  Chronic pain syndrome: Continue to hold oral Dilaudid, use PCA as substitute.  History of PE: Continue Xarelto.  Sickle cell anemia: Hemoglobin is 7.4.  Patient is status post 1 unit of packed red blood cells on yesterday.  Mild intermittent asthma: Albuterol  nebulizer as twice daily needed  Code Status: Full Code Family Communication: N/A Disposition Plan: Not yet ready for discharge  [place], MSN, FNP-C Patient [place] [address] [place], [place] [address] [phone]  If 5PM-7AM, please contact night-coverage.  04/21/2019, 11:19 AM  LOS: 2 days

## 2019-04-21 NOTE — Progress Notes (Signed)
   04/21/19 0428  Vitals  Resp 13  Oxygen Therapy  SpO2 96 %  O2 Device Room Air  O2 Flow Rate (L/min) 0 L/min  End [place] CO2 (EtCO2) 37  Pain Assessment  Pain Score 8  POSS Scale ([place] Opioid Sedation Scale)  POSS *See Group Information* 1-Acceptable,Awake and alert  PCA/Epidural/Spinal Assessment  Respiratory Pattern Regular;Unlabored  Patient/Family Education Done  Incentive Spirometry - Achieved (mL) (RN, NT, or RT) 0 mL  MEWS Score  MEWS RR 1  MEWS Pulse 0  MEWS Systolic 1  MEWS LOC 0  MEWS Temp 0  MEWS Score 2  MEWS Score Color Yellow  MEWS Assessment  Is this an acute change? Yes  MEWS guidelines implemented *See [place]  Provider Notification  Provider Name/Title Baltazar Najjar NP  Date Provider Notified 04/21/19  Time Provider Notified [phone]  Notification Type Page  Notification Reason Other (Comment) (BP 100/71 RR 13)  Response Other (Comment) (provider paged)

## 2019-04-22 ENCOUNTER — Inpatient Hospital Stay (HOSPITAL_COMMUNITY): Payer: Medicare Other

## 2019-04-22 DIAGNOSIS — D5700 Hb-SS disease with crisis, unspecified: Secondary | ICD-10-CM | POA: Diagnosis not present

## 2019-04-22 DIAGNOSIS — G894 Chronic pain syndrome: Secondary | ICD-10-CM | POA: Diagnosis not present

## 2019-04-22 DIAGNOSIS — Z86711 Personal history of pulmonary embolism: Secondary | ICD-10-CM | POA: Diagnosis not present

## 2019-04-22 LAB — CBC
HCT: 22.1 % — ABNORMAL LOW (ref 36.0–46.0)
Hemoglobin: 7.2 g/dL — ABNORMAL LOW (ref 12.0–15.0)
MCH: 30.4 pg (ref 26.0–34.0)
MCHC: 32.6 g/dL (ref 30.0–36.0)
MCV: 93.2 fL (ref 80.0–100.0)
Platelets: 605 10*3/uL — ABNORMAL HIGH (ref 150–400)
RBC: 2.37 MIL/uL — ABNORMAL LOW (ref 3.87–5.11)
RDW: 20.8 % — ABNORMAL HIGH (ref 11.5–15.5)
WBC: 18.7 10*3/uL — ABNORMAL HIGH (ref 4.0–10.5)
nRBC: 0.4 % — ABNORMAL HIGH (ref 0.0–0.2)

## 2019-04-22 LAB — TYPE AND SCREEN
ABO/RH(D): A POS
Antibody Screen: NEGATIVE
Unit division: 0

## 2019-04-22 LAB — BPAM RBC
Blood Product Expiration Date: 202012042359
ISSUE DATE / TIME: 202011040058
Unit Type and Rh: 5100

## 2019-04-22 NOTE — Care Management Important Message (Addendum)
Important Message  Patient Details IM Letter given to Marney Doctor RN to present to the Patient Name: Molly Gutierrez MRN: [id number] Date of Birth: [birth year]   Medicare Important Message Given:  Yes     Kerin Salen 04/22/2019, 10:57 AM

## 2019-04-22 NOTE — Progress Notes (Signed)
Subjective: [facility], a 38 year old female with a medical history significant for sickle cell disease, chronic pain syndrome, opiate dependence and tolerance, history of PE on Xarelto, and history of mild intermittent asthma was admitted in sickle cell pain crisis.  Patient states that pain has not improved much since yesterday.  Pain is localized to mid chest and is characterized as constant and aching.  She also endorses occasional sharp pain to chest.  Pain intensity is 8/10.  She also endorses some shortness of breath.  Patient denies headache, dizziness, paresthesias, persistent cough, dysuria, nausea, vomiting, or diarrhea.   Objective:  Vital signs in last 24 hours:  Vitals:   04/22/19 0624 04/22/19 0756 04/22/19 0810 04/22/19 1027  BP: 107/81   113/80  Pulse: 76   86  Resp: 17  12 16   Temp:    99.4 F (37.4 C)  TempSrc:    Oral  SpO2: 95% 96% 94% 96%  Weight:      Height:        Intake/Output from previous day:   Intake/Output Summary (Last 24 hours) at 04/22/2019 1141 Last data filed at 04/22/2019 0300 Gross per 24 hour  Intake 195 ml  Output -  Net 195 ml    Physical Exam: General: Alert, awake, oriented x3, in no acute distress.  HEENT: [place]/AT PEERL, EOMI Neck: Trachea midline,  no masses, no thyromegal,y no JVD, no carotid bruit OROPHARYNX:  Moist, No exudate/ erythema/lesions.  Heart: Regular rate and rhythm, without murmurs, rubs, gallops, PMI non-displaced, no heaves or thrills on palpation.  Lungs: Clear to auscultation, no wheezing or rhonchi noted. No increased vocal fremitus resonant to percussion  Abdomen: Soft, nontender, nondistended, positive bowel sounds, no masses no hepatosplenomegaly noted..  Neuro: No focal neurological deficits noted cranial nerves II through XII grossly intact. DTRs 2+ bilaterally upper and lower extremities. Strength 5 out of 5 in bilateral upper and lower extremities. Musculoskeletal: No warm swelling or erythema  around joints, no spinal tenderness noted. Psychiatric: Patient alert and oriented x3, good insight and cognition, good recent to remote recall. Lymph node survey: No cervical axillary or inguinal lymphadenopathy noted.  Lab Results:  Basic Metabolic Panel:    Component Value Date/Time   NA 136 04/20/2019 0522   K 3.9 04/20/2019 0522   CL 111 04/20/2019 0522   CO2 20 (L) 04/20/2019 0522   BUN 8 04/20/2019 0522   CREATININE 0.56 04/20/2019 0522   GLUCOSE 119 (H) 04/20/2019 0522   CALCIUM 8.4 (L) 04/20/2019 0522   CBC:    Component Value Date/Time   WBC 18.7 (H) 04/22/2019 0928   HGB 7.2 (L) 04/22/2019 0928   HCT 22.1 (L) 04/22/2019 0928   PLT 605 (H) 04/22/2019 0928   MCV 93.2 04/22/2019 0928   NEUTROABS 12.3 (H) 04/19/2019 0341   LYMPHSABS 5.5 (H) 04/19/2019 0341   MONOABS 2.8 (H) 04/19/2019 0341   EOSABS 0.1 04/19/2019 0341   BASOSABS 0.1 04/19/2019 0341    Recent Results (from the past 240 hour(s))  SARS Coronavirus 2 by RT PCR (hospital order, performed in [place] hospital lab) Nasopharyngeal Nasopharyngeal Swab     Status: None   Collection Time: 04/19/19  5:01 AM   Specimen: Nasopharyngeal Swab  Result Value Ref Range Status   SARS Coronavirus 2 NEGATIVE NEGATIVE Final    Comment: (NOTE) If result is NEGATIVE SARS-CoV-2 target nucleic acids are NOT DETECTED. The SARS-CoV-2 RNA is generally detectable in upper and lower  respiratory specimens during  the acute phase of infection. The lowest  concentration of SARS-CoV-2 viral copies this assay can detect is 250  copies / mL. A negative result does not preclude SARS-CoV-2 infection  and should not be used as the sole basis for treatment or other  patient management decisions.  A negative result may occur with  improper specimen collection / handling, submission of specimen other  than nasopharyngeal swab, presence of viral mutation(s) within the  areas targeted by this assay, and inadequate number of viral  copies  (<250 copies / mL). A negative result must be combined with clinical  observations, patient history, and epidemiological information. If result is POSITIVE SARS-CoV-2 target nucleic acids are DETECTED. The SARS-CoV-2 RNA is generally detectable in upper and lower  respiratory specimens dur ing the acute phase of infection.  Positive  results are indicative of active infection with SARS-CoV-2.  Clinical  correlation with patient history and other diagnostic information is  necessary to determine patient infection status.  Positive results do  not rule out bacterial infection or co-infection with other viruses. If result is PRESUMPTIVE POSTIVE SARS-CoV-2 nucleic acids MAY BE PRESENT.   A presumptive positive result was obtained on the submitted specimen  and confirmed on repeat testing.  While 2019 novel coronavirus  (SARS-CoV-2) nucleic acids may be present in the submitted sample  additional confirmatory testing may be necessary for epidemiological  and / or clinical management purposes  to differentiate between  SARS-CoV-2 and other Sarbecovirus currently known to infect humans.  If clinically indicated additional testing with an alternate test  methodology [phone]) is advised. The SARS-CoV-2 RNA is generally  detectable in upper and lower respiratory sp ecimens during the acute  phase of infection. The expected result is Negative. Fact Sheet for Patients:  StrictlyIdeas.no Fact Sheet for Healthcare Providers: BankingDealers.co.za This test is not yet approved or cleared by the [place] FDA and has been authorized for detection and/or diagnosis of SARS-CoV-2 by FDA under an Emergency Use Authorization (EUA).  This EUA will remain in effect (meaning this test can be used) for the duration of the COVID-19 declaration under Section 564(b)(1) of the Act, 21 U.S.C. section 360bbb-3(b)(1), unless the authorization is terminated  or revoked sooner. Performed at [facility], [place] [address], [place], [place] [address]     Studies/Results: No results found.  Medications: Scheduled Meds: . Chlorhexidine Gluconate Cloth  6 each Topical Daily  . Deferasirox  1,080 mg Oral Daily  . folic acid  1 mg Oral Daily  . hydrocerin   Topical BID  . HYDROmorphone   Intravenous Q4H  . ketorolac  15 mg Intravenous Q6H  . mirtazapine  45 mg Oral QHS  . mometasone-formoterol  2 puff Inhalation BID  . pantoprazole  40 mg Oral Daily  . rivaroxaban  20 mg Oral Q supper  . senna-docusate  1 tablet Oral BID  . sodium chloride flush  3 mL Intravenous Once  . voxelotor  1,500 mg Oral Daily   Continuous Infusions: . diphenhydrAMINE 25 mg (04/22/19 0412)   PRN Meds:.albuterol, diphenhydrAMINE, naloxone **AND** sodium chloride flush, ondansetron (ZOFRAN) IV, oxyCODONE, polyethylene glycol  Consultants:  None  Procedures:  None  Antibiotics:  None  Assessment/Plan: Principal Problem:   Sickle cell pain crisis (HCC) Active Problems:   Leukocytosis   Chronic pain syndrome   History of pulmonary embolism   Breast mass in female  Sickle cell disease with pain crisis: Weaning weight-based Dilaudid PCA.  No further changes  in settings on today. Oxycodone 10 mg every 4 hours as needed for severe, breakthrough pain Continue Toradol 15 mg IV every 6 hours for a total of 5 days Supplemental oxygen as needed, maintain oxygen saturation above 90%.  Leukocytosis: WBCs improved from previous.  Patient remains afebrile.  No signs of infection or inflammation.  Repeat chest x-ray shows no acute cardiopulmonary process, only interstitial opacities.  Chronic pain syndrome: Continue to hold oral Dilaudid, use PCA as substitute.  History of PE: Continue Xarelto  Sickle cell anemia: Hemoglobin stable.  Patient is status post 1 unit of packed red blood cells.  Continue to monitor closely.  Mild  intermittent asthma: Albuterol nebulizer twice daily as needed Code Status: Full Code Family Communication: N/A Disposition Plan: Not yet ready for discharge  [place], MSN, FNP-C Patient [facility] [address] [place], [place] [address] [phone]  If 5PM-7AM, please contact night-coverage.  04/22/2019, 11:41 AM  LOS: 3 days    This note was prepared using Dragon speech recognition software, errors in dictation are unintentional.

## 2019-04-23 DIAGNOSIS — D5700 Hb-SS disease with crisis, unspecified: Secondary | ICD-10-CM | POA: Diagnosis not present

## 2019-04-23 DIAGNOSIS — G894 Chronic pain syndrome: Secondary | ICD-10-CM | POA: Diagnosis not present

## 2019-04-23 DIAGNOSIS — Z86711 Personal history of pulmonary embolism: Secondary | ICD-10-CM | POA: Diagnosis not present

## 2019-04-23 DIAGNOSIS — N63 Unspecified lump in unspecified breast: Secondary | ICD-10-CM | POA: Diagnosis not present

## 2019-04-23 LAB — CBC
HCT: 22.1 % — ABNORMAL LOW (ref 36.0–46.0)
Hemoglobin: 7.4 g/dL — ABNORMAL LOW (ref 12.0–15.0)
MCH: 31.1 pg (ref 26.0–34.0)
MCHC: 33.5 g/dL (ref 30.0–36.0)
MCV: 92.9 fL (ref 80.0–100.0)
Platelets: 633 10*3/uL — ABNORMAL HIGH (ref 150–400)
RBC: 2.38 MIL/uL — ABNORMAL LOW (ref 3.87–5.11)
RDW: 20.7 % — ABNORMAL HIGH (ref 11.5–15.5)
WBC: 16.5 10*3/uL — ABNORMAL HIGH (ref 4.0–10.5)
nRBC: 0.5 % — ABNORMAL HIGH (ref 0.0–0.2)

## 2019-04-23 MED ORDER — HYDROMORPHONE HCL 4 MG PO TABS
4.0000 mg | ORAL_TABLET | ORAL | Status: DC | PRN
  Administered 2019-04-23 – 2019-04-26 (×10): 4 mg via ORAL
  Filled 2019-04-23 (×10): qty 1

## 2019-04-23 MED ORDER — HYDROMORPHONE 1 MG/ML IV SOLN
INTRAVENOUS | Status: DC
  Administered 2019-04-23 (×2): 3 mg via INTRAVENOUS
  Administered 2019-04-23: 30 mg via INTRAVENOUS
  Administered 2019-04-24: 6.6 mg via INTRAVENOUS
  Administered 2019-04-24: 3 mg via INTRAVENOUS
  Administered 2019-04-24: 1.5 mg via INTRAVENOUS
  Administered 2019-04-24: 1.8 mg via INTRAVENOUS
  Administered 2019-04-24: 4.8 mg via INTRAVENOUS
  Administered 2019-04-25: 30 mg via INTRAVENOUS
  Administered 2019-04-25: 0.9 mg via INTRAVENOUS
  Administered 2019-04-25: 0.3 mg via INTRAVENOUS
  Administered 2019-04-25: 1.2 mg via INTRAVENOUS
  Administered 2019-04-25: 2.7 mg via INTRAVENOUS
  Administered 2019-04-25: 3.3 mg via INTRAVENOUS
  Administered 2019-04-25: 2.1 mg via INTRAVENOUS
  Administered 2019-04-26 (×2): 3.3 mg via INTRAVENOUS
  Administered 2019-04-26: 2.4 mg via INTRAVENOUS
  Administered 2019-04-26: 3.3 mg via INTRAVENOUS
  Filled 2019-04-23 (×3): qty 30

## 2019-04-23 NOTE — Progress Notes (Signed)
Subjective: [facility], a 38 year old female with a medical history significant for sickle cell disease, chronic pain syndrome, opiate dependence and tolerance, history of PE on Xarelto, and history of mild intermittent asthma was admitted in sickle cell pain crisis.  Patient states that pain intensity is 8/10 and is localized to mid chest. Pain is characterized as intermittent, aching, and occasionally sharp.  Patient denies headache, dizziness, paresthesias, dysuria, nausea, vomiting, or diarrhea. Objective:  Vital signs in last 24 hours:  Vitals:   04/23/19 0355 04/23/19 0600 04/23/19 0740 04/23/19 0925  BP:  122/76  124/82  Pulse:  75  86  Resp:  14 12 16   Temp:  98 F (36.7 C)  99.3 F (37.4 C)  TempSrc:  Oral  Oral  SpO2: 98% 98% 97% 95%  Weight:      Height:        Intake/Output from previous day:   Intake/Output Summary (Last 24 hours) at 04/23/2019 1153 Last data filed at 04/23/2019 0900 Gross per 24 hour  Intake 240 ml  Output -  Net 240 ml    Physical Exam: General: Alert, awake, oriented x3, in no acute distress.  HEENT: [place]/AT PEERL, EOMI Neck: Trachea midline,  no masses, no thyromegal,y no JVD, no carotid bruit OROPHARYNX:  Moist, No exudate/ erythema/lesions.  Heart: Regular rate and rhythm, without murmurs, rubs, gallops, PMI non-displaced, no heaves or thrills on palpation.  Lungs: Clear to auscultation, no wheezing or rhonchi noted. No increased vocal fremitus resonant to percussion  Abdomen: Soft, nontender, nondistended, positive bowel sounds, no masses no hepatosplenomegaly noted..  Neuro: No focal neurological deficits noted cranial nerves II through XII grossly intact. DTRs 2+ bilaterally upper and lower extremities. Strength 5 out of 5 in bilateral upper and lower extremities. Musculoskeletal: No warm swelling or erythema around joints, no spinal tenderness noted. Psychiatric: Patient alert and oriented x3, good insight and cognition, good  recent to remote recall. Lymph node survey: No cervical axillary or inguinal lymphadenopathy noted.  Lab Results:  Basic Metabolic Panel:    Component Value Date/Time   NA 136 04/20/2019 0522   K 3.9 04/20/2019 0522   CL 111 04/20/2019 0522   CO2 20 (L) 04/20/2019 0522   BUN 8 04/20/2019 0522   CREATININE 0.56 04/20/2019 0522   GLUCOSE 119 (H) 04/20/2019 0522   CALCIUM 8.4 (L) 04/20/2019 0522   CBC:    Component Value Date/Time   WBC 16.5 (H) 04/23/2019 0730   HGB 7.4 (L) 04/23/2019 0730   HCT 22.1 (L) 04/23/2019 0730   PLT 633 (H) 04/23/2019 0730   MCV 92.9 04/23/2019 0730   NEUTROABS 12.3 (H) 04/19/2019 0341   LYMPHSABS 5.5 (H) 04/19/2019 0341   MONOABS 2.8 (H) 04/19/2019 0341   EOSABS 0.1 04/19/2019 0341   BASOSABS 0.1 04/19/2019 0341    Recent Results (from the past 240 hour(s))  SARS Coronavirus 2 by RT PCR (hospital order, performed in [facility] hospital lab) Nasopharyngeal Nasopharyngeal Swab     Status: None   Collection Time: 04/19/19  5:01 AM   Specimen: Nasopharyngeal Swab  Result Value Ref Range Status   SARS Coronavirus 2 NEGATIVE NEGATIVE Final    Comment: (NOTE) If result is NEGATIVE SARS-CoV-2 target nucleic acids are NOT DETECTED. The SARS-CoV-2 RNA is generally detectable in upper and lower  respiratory specimens during the acute phase of infection. The lowest  concentration of SARS-CoV-2 viral copies this assay can detect is 250  copies / mL. A negative result does  not preclude SARS-CoV-2 infection  and should not be used as the sole basis for treatment or other  patient management decisions.  A negative result may occur with  improper specimen collection / handling, submission of specimen other  than nasopharyngeal swab, presence of viral mutation(s) within the  areas targeted by this assay, and inadequate number of viral copies  (<250 copies / mL). A negative result must be combined with clinical  observations, patient history, and  epidemiological information. If result is POSITIVE SARS-CoV-2 target nucleic acids are DETECTED. The SARS-CoV-2 RNA is generally detectable in upper and lower  respiratory specimens dur ing the acute phase of infection.  Positive  results are indicative of active infection with SARS-CoV-2.  Clinical  correlation with patient history and other diagnostic information is  necessary to determine patient infection status.  Positive results do  not rule out bacterial infection or co-infection with other viruses. If result is PRESUMPTIVE POSTIVE SARS-CoV-2 nucleic acids MAY BE PRESENT.   A presumptive positive result was obtained on the submitted specimen  and confirmed on repeat testing.  While 2019 novel coronavirus  (SARS-CoV-2) nucleic acids may be present in the submitted sample  additional confirmatory testing may be necessary for epidemiological  and / or clinical management purposes  to differentiate between  SARS-CoV-2 and other Sarbecovirus currently known to infect humans.  If clinically indicated additional testing with an alternate test  methodology [phone]) is advised. The SARS-CoV-2 RNA is generally  detectable in upper and lower respiratory sp ecimens during the acute  phase of infection. The expected result is Negative. Fact Sheet for Patients:  StrictlyIdeas.no Fact Sheet for Healthcare Providers: BankingDealers.co.za This test is not yet approved or cleared by the [place] FDA and has been authorized for detection and/or diagnosis of SARS-CoV-2 by FDA under an Emergency Use Authorization (EUA).  This EUA will remain in effect (meaning this test can be used) for the duration of the COVID-19 declaration under Section 564(b)(1) of the Act, 21 U.S.C. section 360bbb-3(b)(1), unless the authorization is terminated or revoked sooner. Performed at [facility], [place] [address] [place], [place] [address]      Studies/Results: Dg Chest 2 View  Result Date: 04/22/2019 CLINICAL DATA:  38 year old female with chest pain. EXAM: CHEST - 2 VIEW COMPARISON:  04/19/2019 and prior radiographs FINDINGS: Cardiomegaly, RIGHT IJ Port-A-Cath with tip overlying the LOWER SVC and LEFT IJ Port-A-Cath with tip overlying the SUPERIOR cavoatrial junction again noted. New mild pulmonary vascular congestion and minimal interstitial opacities noted with possible trace bilateral pleural effusions. There is no evidence of pneumothorax, airspace disease or acute bony abnormality. IMPRESSION: 1. New mild pulmonary vascular congestion and minimal interstitial opacities with possible trace bilateral pleural effusions. 2. Stable cardiomegaly and Port-A-Cath. Electronically Signed   By: Margarette Canada M.D.   On: 04/22/2019 12:55    Medications: Scheduled Meds: . Chlorhexidine Gluconate Cloth  6 each Topical Daily  . Deferasirox  1,080 mg Oral Daily  . folic acid  1 mg Oral Daily  . hydrocerin   Topical BID  . HYDROmorphone   Intravenous Q4H  . ketorolac  15 mg Intravenous Q6H  . mirtazapine  45 mg Oral QHS  . mometasone-formoterol  2 puff Inhalation BID  . pantoprazole  40 mg Oral Daily  . rivaroxaban  20 mg Oral Q supper  . senna-docusate  1 tablet Oral BID  . sodium chloride flush  3 mL Intravenous Once  . voxelotor  1,500 mg Oral  Daily   Continuous Infusions: . diphenhydrAMINE 25 mg (04/23/19 0928)   PRN Meds:.albuterol, diphenhydrAMINE, HYDROmorphone, naloxone **AND** sodium chloride flush, ondansetron (ZOFRAN) IV, polyethylene glycol  Consultants:  None  Procedures:  None  Antibiotics:  None  Assessment/Plan: Principal Problem:   Sickle cell pain crisis (HCC) Active Problems:   Leukocytosis   Chronic pain syndrome   History of pulmonary embolism   Breast mass in female  Sickle cell disease with pain crisis: Waiting weight-based Dilaudid PCA.  Settings changed to 0.3 mg, 8-minute lockout, 1.5  mg/h. Discontinue oxycodone 10 mg every 4 hours.  Patient transition to home medications.  Hydromorphone 4 mg by mouth every 4 hours as needed for severe breakthrough pain Continue Toradol 15 mg every 6 hours for a total of 5 days Supplemental oxygen as needed, maintain oxygen saturation above 90% Patient will ambulate in halls on today  Leukocytosis: WBCs improved from previous.  Patient afebrile.  No signs of infection or inflammation.  Repeat chest x-ray shows no acute cardiopulmonary process, only interstitial opacities.  Chronic pain syndrome: Restart home medications.  History of PE: Continue Xarelto  Sickle cell anemia: Hemoglobin is 7.4.  No clinical indication for blood transfusion at this time.  Continue to follow CBC.  Code Status: Full Code Family Communication: N/A Disposition Plan: Not yet ready for discharge  [place], MSN, FNP-C Patient [facility] [address] [place], [place] [address] [phone] If 5PM-7AM, please contact night-coverage.  04/23/2019, 11:53 AM  LOS: 4 days

## 2019-04-24 DIAGNOSIS — D5700 Hb-SS disease with crisis, unspecified: Secondary | ICD-10-CM | POA: Diagnosis not present

## 2019-04-24 NOTE — Progress Notes (Signed)
Subjective: Patient is a 38 year old with sickle cell crisis.  Patient still having pain at 7 out of 10 in her back and legs.  She is on Dilaudid PCA.  She has 17 mg in the last 24 hours.  She has been laying in bed.  Has not been walking around.  Objective: Vital signs in last 24 hours: Temp:  [98 F (36.7 C)-99.3 F (37.4 C)] 99.3 F (37.4 C) (11/07 0527) Pulse Rate:  [79-91] 91 (11/07 0527) Resp:  [11-20] 16 (11/07 0749) BP: (110-135)/(71-91) 135/91 (11/07 0527) SpO2:  [94 %-97 %] 94 % (11/07 0749) Weight change:  Last BM Date: 04/19/19  Intake/Output from previous day: 11/06 0701 - 11/07 0700 In: 1360 [P.O.:960; IV Piggyback:400] Out: -  Intake/Output this shift: No intake/output data recorded.  General appearance: alert, cooperative, appears stated age and no distress Neck: no adenopathy, no carotid bruit, no JVD, supple, symmetrical, trachea midline and thyroid not enlarged, symmetric, no tenderness/mass/nodules Back: symmetric, no curvature. ROM normal. No CVA tenderness. Resp: clear to auscultation bilaterally Cardio: regular rate and rhythm, S1, S2 normal, no murmur, click, rub or gallop GI: soft, non-tender; bowel sounds normal; no masses,  no organomegaly Extremities: extremities normal, atraumatic, no cyanosis or edema Pulses: 2+ and symmetric Skin: Skin color, texture, turgor normal. No rashes or lesions Neurologic: Grossly normal  Lab Results: Recent Labs    04/22/19 0928 04/23/19 0730  WBC 18.7* 16.5*  HGB 7.2* 7.4*  HCT 22.1* 22.1*  PLT 605* 633*   BMET No results for input(s): NA, K, CL, CO2, GLUCOSE, BUN, CREATININE, CALCIUM in the last 72 hours.  Studies/Results: Dg Chest 2 View  Result Date: 04/22/2019 CLINICAL DATA:  38 year old female with chest pain. EXAM: CHEST - 2 VIEW COMPARISON:  04/19/2019 and prior radiographs FINDINGS: Cardiomegaly, RIGHT IJ Port-A-Cath with tip overlying the LOWER SVC and LEFT IJ Port-A-Cath with tip overlying the  SUPERIOR cavoatrial junction again noted. New mild pulmonary vascular congestion and minimal interstitial opacities noted with possible trace bilateral pleural effusions. There is no evidence of pneumothorax, airspace disease or acute bony abnormality. IMPRESSION: 1. New mild pulmonary vascular congestion and minimal interstitial opacities with possible trace bilateral pleural effusions. 2. Stable cardiomegaly and Port-A-Cath. Electronically Signed   By: Margarette Canada M.D.   On: 04/22/2019 12:55    Medications: I have reviewed the patient's current medications.  Assessment/Plan: A 38 year old admitted with sickle cell painful crisis  #1 sickle cell painful crisis: Patient still on PCA.  She is using up to 70 mg in 24 hours.  Also on hydromorphone orally.  She has completed Toradol.  We will continue close monitoring.  Plan for discharge next 24 to 48 hours.  #2 chronic pain syndrome: Continue home regimen  #3 history of PE: On Xarelto.  Continue  #4 anemia of chronic disease: Continue to monitor H&H  #5 leukocytosis: Secondary to vaso-occlusive crisis.  Continue to monitor   LOS: 5 days   GARBA,LAWAL 04/24/2019, 8:21 AM

## 2019-04-25 DIAGNOSIS — D5700 Hb-SS disease with crisis, unspecified: Secondary | ICD-10-CM | POA: Diagnosis not present

## 2019-04-25 LAB — CBC WITH DIFFERENTIAL/PLATELET
Abs Immature Granulocytes: 0.09 10*3/uL — ABNORMAL HIGH (ref 0.00–0.07)
Basophils Absolute: 0.1 10*3/uL (ref 0.0–0.1)
Basophils Relative: 1 %
Eosinophils Absolute: 0.4 10*3/uL (ref 0.0–0.5)
Eosinophils Relative: 3 %
HCT: 22.6 % — ABNORMAL LOW (ref 36.0–46.0)
Hemoglobin: 7.4 g/dL — ABNORMAL LOW (ref 12.0–15.0)
Immature Granulocytes: 1 %
Lymphocytes Relative: 21 %
Lymphs Abs: 3 10*3/uL (ref 0.7–4.0)
MCH: 30.3 pg (ref 26.0–34.0)
MCHC: 32.7 g/dL (ref 30.0–36.0)
MCV: 92.6 fL (ref 80.0–100.0)
Monocytes Absolute: 1.7 10*3/uL — ABNORMAL HIGH (ref 0.1–1.0)
Monocytes Relative: 12 %
Neutro Abs: 8.9 10*3/uL — ABNORMAL HIGH (ref 1.7–7.7)
Neutrophils Relative %: 62 %
Platelets: 613 10*3/uL — ABNORMAL HIGH (ref 150–400)
RBC: 2.44 MIL/uL — ABNORMAL LOW (ref 3.87–5.11)
RDW: 19 % — ABNORMAL HIGH (ref 11.5–15.5)
WBC: 14.2 10*3/uL — ABNORMAL HIGH (ref 4.0–10.5)
nRBC: 0.4 % — ABNORMAL HIGH (ref 0.0–0.2)

## 2019-04-25 LAB — COMPREHENSIVE METABOLIC PANEL
ALT: 18 U/L (ref 0–44)
AST: 25 U/L (ref 15–41)
Albumin: 3.9 g/dL (ref 3.5–5.0)
Alkaline Phosphatase: 49 U/L (ref 38–126)
Anion gap: 8 (ref 5–15)
BUN: 7 mg/dL (ref 6–20)
CO2: 24 mmol/L (ref 22–32)
Calcium: 8.4 mg/dL — ABNORMAL LOW (ref 8.9–10.3)
Chloride: 106 mmol/L (ref 98–111)
Creatinine, Ser: 0.53 mg/dL (ref 0.44–1.00)
GFR calc Af Amer: >60 mL/min (ref >60)
GFR calc non Af Amer: >60 mL/min (ref >60)
Glucose, Bld: 84 mg/dL (ref 70–99)
Potassium: 3.8 mmol/L (ref 3.5–5.1)
Sodium: 138 mmol/L (ref 135–145)
Total Bilirubin: 2.2 mg/dL — ABNORMAL HIGH (ref 0.3–1.2)
Total Protein: 7.1 g/dL (ref 6.5–8.1)

## 2019-04-25 NOTE — Progress Notes (Signed)
Subjective: Patient is doing much better.  Pain is down to 6 out of 10.  She is still on oral cephalexin the PCA.  Goal is to get it at 5 for lower by tomorrow so patient can be discharged.  Objective: Vital signs in last 24 hours: Temp:  [98 F (36.7 C)-99.1 F (37.3 C)] 98.9 F (37.2 C) (11/08 1411) Pulse Rate:  [78-88] 82 (11/08 1411) Resp:  [12-16] 16 (11/08 1550) BP: (90-136)/(56-90) 90/56 (11/08 1411) SpO2:  [94 %-100 %] 97 % (11/08 1550) Weight:  [56.6 kg] 56.6 kg (11/08 0603) Weight change:  Last BM Date: 04/19/19  Intake/Output from previous day: 11/07 0701 - 11/08 0700 In: 240 [P.O.:240] Out: -  Intake/Output this shift: No intake/output data recorded.  General appearance: alert, cooperative, appears stated age and no distress Neck: no adenopathy, no carotid bruit, no JVD, supple, symmetrical, trachea midline and thyroid not enlarged, symmetric, no tenderness/mass/nodules Back: symmetric, no curvature. ROM normal. No CVA tenderness. Resp: clear to auscultation bilaterally Cardio: regular rate and rhythm, S1, S2 normal, no murmur, click, rub or gallop GI: soft, non-tender; bowel sounds normal; no masses,  no organomegaly Extremities: extremities normal, atraumatic, no cyanosis or edema Pulses: 2+ and symmetric Skin: Skin color, texture, turgor normal. No rashes or lesions Neurologic: Grossly normal  Lab Results: Recent Labs    04/23/19 0730 04/25/19 0526  WBC 16.5* 14.2*  HGB 7.4* 7.4*  HCT 22.1* 22.6*  PLT 633* 613*   BMET Recent Labs    04/25/19 0526  NA 138  K 3.8  CL 106  CO2 24  GLUCOSE 84  BUN 7  CREATININE 0.53  CALCIUM 8.4*    Studies/Results: No results found.  Medications: I have reviewed the patient's current medications.  Assessment/Plan: A 38 year old admitted with sickle cell painful crisis  #1 sickle cell painful crisis: Patient will be maintained on current regimen.  We will keep titrating PCA off.  Goal is to get her pain  level down to 5 or less.  She will be discharged when the pain level is below 5.  #2 chronic pain syndrome: Continue home regimen  #3 history of PE: On Xarelto.  Continue  #4 anemia of chronic disease: Continue to monitor H&H  #5 leukocytosis: Secondary to vaso-occlusive crisis.  Continue to monitor   LOS: 6 days   Bohdan Macho,LAWAL 04/25/2019, 8:31 PM

## 2019-04-26 DIAGNOSIS — D72829 Elevated white blood cell count, unspecified: Secondary | ICD-10-CM

## 2019-04-26 DIAGNOSIS — G894 Chronic pain syndrome: Secondary | ICD-10-CM | POA: Diagnosis not present

## 2019-04-26 DIAGNOSIS — D5700 Hb-SS disease with crisis, unspecified: Secondary | ICD-10-CM | POA: Diagnosis not present

## 2019-04-26 DIAGNOSIS — N63 Unspecified lump in unspecified breast: Secondary | ICD-10-CM | POA: Diagnosis not present

## 2019-04-26 MED ORDER — HYDROMORPHONE HCL 4 MG PO TABS: 4.0000 mg | ORAL_TABLET | Freq: Four times a day (QID) | ORAL | 0 refills | Status: DC | PRN

## 2019-04-26 MED ORDER — HEPARIN SOD (PORK) LOCK FLUSH 100 UNIT/ML IV SOLN
500.0000 [IU] | Freq: Once | INTRAVENOUS | Status: AC
  Administered 2019-04-26: 500 [IU] via INTRAVENOUS
  Filled 2019-04-26: qty 5

## 2019-04-26 NOTE — Care Management Important Message (Signed)
Important Message  Patient Details IM Letter given to Marney Doctor RN to present to the Patient Name: Molly Gutierrez MRN: [id number] Date of Birth: 1980/10/17   Medicare Important Message Given:  Yes     Kerin Salen 04/26/2019, 10:21 AM

## 2019-04-26 NOTE — Discharge Summary (Signed)
Physician Discharge Summary  [facility] DQ:[id number] DOB: 1981-01-26 DOA: 04/19/2019  PCP: System, Provider Not In  Admit date: 04/19/2019  Discharge date: 04/26/2019  Discharge Diagnoses:  Principal Problem:   Sickle cell pain crisis ([place]) Active Problems:   Leukocytosis   Chronic pain syndrome   History of pulmonary embolism   Breast mass in female   Discharge Condition: Stable  Disposition:  Follow-up Information    [facility], [facility]. Call in 1 day.   Specialty: Diagnostic Radiology Why: To schedule an appointment Contact information: [address] [address] [phone]          Pt is discharged home in good condition and is to follow up with System, Provider Not In this week to have labs evaluated. Molly Gutierrez is instructed to increase activity slowly and balance with rest for the next few days, and use prescribed medication to complete treatment of pain  Diet: Regular Wt Readings from Last 3 Encounters:  04/25/19 56.6 kg  04/17/19 57.6 kg  10/02/18 54 kg    History of present illness:  Molly Gutierrez is a 38 year old female with a medical history significant for sickle cell disease, chronic pain syndrome, opiate dependence and tolerance, history of mild intermittent asthma, history of anemia of chronic disease, and history of pulmonary embolism on Xarelto presented to ER complaining of chest pain that is consistent with previous sickle cell crisis.  Patient states that she was in her usual state of health until 3 days prior to admission.  She is currently visiting family in this area.  She is from [place], [place].  She noticed that pain intensity was increasing to the point that it was uncontrolled by her home medications.  She attributes current pain crisis to decreased amount of rest and changes in weather.  Current pain intensity is 8/10 primarily to mid chest characterized as  constant, aching, and occasionally sharp.  She denies shortness of breath, heart palpitations, dizziness, paresthesias, blurred vision, headache, dysuria, nausea, vomiting, or diarrhea.  Patient denies recent travel, sick contacts, or exposure to COVID-19.  She is followed by [facility] hematology, she is scheduled to reestablish care with Dr. Altamese Cabal.    ER course: WBCs 21,000, hemoglobin 7.3 (appears to be consistent with patient's baseline), platelets 631.  Creatinine 0.53 and total bilirubin 3.5.  BP 134/89, T 98.9 F, P 90, RR 14, oxygen saturation 96% on RA.  Patient underwent CTA due to complaints of chest pain, no PE identified, sequelae of sickle cell disease including evidence for extra medullary hematopoiesis, complex right breast measuring 2.3 x 1.5 cm.  Chest x-ray showed no acute cardiopulmonary process.  COVID-19 test negative.  Patient admitted to [facility] for management of sickle cell pain crisis.   Hospital Course:  Sickle cell disease with pain crisis: Patient was admitted for sickle cell pain crisis and managed appropriately with IVF, IV Dilaudid via PCA and IV Toradol, as well as other adjunct therapies per sickle cell pain management protocols.  IV Dilaudid was weaned appropriately and patient was transitioned to Dilaudid 4 mg every 4 hours as needed.  Patient currently is out of medications and her appointment with hematology to reestablish care is in 2 weeks.  Dilaudid 4 mg every 6 hours #20 was sent to patient's pharmacy.  Patient advised to follow-up as scheduled for CBC with differential, and CMP.  Anemia of chronic disease: Hemoglobin decreased to 6.1, which is below patient's baseline.  Patient was transfused 1  unit of PRBCs.  CBC prior to discharge is 7.4.  Patient advised to continue folic acid.  Patient's pain intensity decreased to 5-6/10.  She says that she can manage at home at this point.  Patient advised to follow-up with hematology as scheduled.  She was given information  for sickle cell day infusion center for acute pain crisis.  Patient is alert, oriented, and ambulating without assistance.   Patient was discharged home today in a hemodynamically stable condition.   Discharge Exam: Vitals:   04/26/19 1052 04/26/19 1147  BP:    Pulse:    Resp:  16  Temp:    SpO2: 97%    Vitals:   04/26/19 0746 04/26/19 1030 04/26/19 1052 04/26/19 1147  BP:  125/81    Pulse:  97    Resp: 18 16  16   Temp:  99.4 F (37.4 C)    TempSrc:  Oral    SpO2: 97% 96% 97%   Weight:      Height:        General appearance : Awake, alert, not in any distress. Speech Clear. Not toxic looking HEENT: Atraumatic and Normocephalic, pupils equally reactive to light and accomodation Neck: Supple, no JVD. No cervical lymphadenopathy.  Chest: Good air entry bilaterally, no added sounds  CVS: S1 S2 regular, no murmurs.  Abdomen: Bowel sounds present, Non tender and not distended with no gaurding, rigidity or rebound. Extremities: B/L Lower Ext shows no edema, both legs are warm to touch Neurology: Awake alert, and oriented X 3, CN II-XII intact, Non focal Skin: No Rash  Discharge Instructions  Discharge Instructions    Discharge patient   Complete by: As directed    Discharge disposition: 01-Home or Self Care   Discharge patient date: 04/26/2019     Allergies as of 04/26/2019      Reactions   Cefaclor Hives, Swelling   Hydroxyurea Other (See Comments), Palpitations   Lower blood levels and HR Other reaction(s): Hypotension, Other (See Comments) "it messes me up, it drops my levels and stuff" Lower blood levels and HR "it messes me up, it drops my levels and stuff"      Medication List    TAKE these medications   albuterol 108 (90 Base) MCG/ACT inhaler Commonly known as: VENTOLIN HFA Inhale 2 puffs into the lungs 2 (two) times daily as needed for wheezing or shortness of breath.   Deferasirox 360 MG Tabs Take 1,080 mg by mouth daily. Notes to patient: TAKE AS  INSTRUCTED   diphenhydrAMINE 25 mg capsule Commonly known as: BENADRYL Take 25 mg by mouth 3 (three) times daily as needed for itching.   folic acid 1 MG tablet Commonly known as: FOLVITE Take 1 mg by mouth daily.   HYDROmorphone 4 MG tablet Commonly known as: DILAUDID Take 1 tablet (4 mg total) by mouth every 6 (six) hours as needed for severe pain. What changed: when to take this   mirtazapine 45 MG tablet Commonly known as: REMERON Take 45 mg by mouth at bedtime.   mometasone-formoterol 100-5 MCG/ACT Aero Commonly known as: DULERA Inhale 2 puffs into the lungs daily as needed for wheezing or shortness of breath.   omeprazole 20 MG capsule Commonly known as: PRILOSEC Take 20 mg by mouth 2 (two) times a day. Notes to patient: TAKE AS INSTRUCTED   Oxbryta 500 MG Tabs tablet Generic drug: voxelotor Take 1,500 mg by mouth daily. Notes to patient: TAKE AS INSTRUCTED   promethazine 25 MG tablet  Commonly known as: PHENERGAN Take 25 mg by mouth every 6 (six) hours as needed for nausea/vomiting.   vitamin B-12 1000 MCG tablet Commonly known as: CYANOCOBALAMIN Take 1,000 mcg by mouth daily. Notes to patient: TAKE AS INSTRUCTED   Vitamin D3 25 MCG (1000 UT) Caps Take 1,000 Units by mouth daily. Notes to patient: TAKE AS INSTRUCTED   Xarelto 20 MG Tabs tablet Generic drug: rivaroxaban Take 20 mg by mouth daily.       The results of significant diagnostics from this hospitalization (including imaging, microbiology, ancillary and laboratory) are listed below for reference.    Significant Diagnostic Studies: Dg Chest 2 View  Result Date: 04/22/2019 CLINICAL DATA:  38 year old female with chest pain. EXAM: CHEST - 2 VIEW COMPARISON:  04/19/2019 and prior radiographs FINDINGS: Cardiomegaly, RIGHT IJ Port-A-Cath with tip overlying the LOWER SVC and LEFT IJ Port-A-Cath with tip overlying the SUPERIOR cavoatrial junction again noted. New mild pulmonary vascular congestion and  minimal interstitial opacities noted with possible trace bilateral pleural effusions. There is no evidence of pneumothorax, airspace disease or acute bony abnormality. IMPRESSION: 1. New mild pulmonary vascular congestion and minimal interstitial opacities with possible trace bilateral pleural effusions. 2. Stable cardiomegaly and Port-A-Cath. Electronically Signed   By: Margarette Canada M.D.   On: 04/22/2019 12:55   Ct Angio Chest Pe W And/or Wo Contrast  Result Date: 04/19/2019 CLINICAL DATA:  Chest pain EXAM: CT ANGIOGRAPHY CHEST WITH CONTRAST TECHNIQUE: Multidetector CT imaging of the chest was performed using the standard protocol during bolus administration of intravenous contrast. Multiplanar CT image reconstructions and MIPs were obtained to evaluate the vascular anatomy. CONTRAST:  180mL OMNIPAQUE IOHEXOL 350 MG/ML SOLN COMPARISON:  None. FINDINGS: Cardiovascular: Evaluation for pulmonary emboli is significantly limited by respiratory motion artifact.Given this limitation, no pulmonary embolism was detected. The heart size is enlarged. There is no CT evidence for an aortic dissection. There are well-positioned bilateral porta cast. Mediastinum/Nodes: --No mediastinal or hilar lymphadenopathy. --No axillary lymphadenopathy. --No supraclavicular lymphadenopathy. --Normal thyroid gland. --The esophagus is unremarkable Lungs/Pleura: No pulmonary nodules or masses. No pleural effusion or pneumothorax. No focal airspace consolidation. No focal pleural abnormality. Upper Abdomen: The spleen is heavily calcified consistent with the patient's history of sickle cell disease. Musculoskeletal: There is a well-circumscribed soft tissue mass adjacent to the T9 vertebral body on the right. There is a heterogeneous appearance of virtually all of the visualized osseous structures. There is a complex 2.3 x 1.5 cm right breast mass with coarse peripheral calcifications. There is cortical thinning of the upper pole the right  kidney. Review of the MIP images confirms the above findings. IMPRESSION: 1. Evaluation for pulmonary emboli is limited by respiratory motion artifact. Given this limitation, no PE was identified. 2. Sequela of sickle cell disease including evidence for extra medullary hematopoiesis. 3. Complex right breast mass measuring approximately 2.3 x 1.5 cm. Follow-up with a nonemergent outpatient mammogram is recommended. Electronically Signed   By: Constance Holster M.D.   On: 04/19/2019 06:12   Dg Chest Portable 1 View  Result Date: 04/19/2019 CLINICAL DATA:  Sickle cell pain. EXAM: PORTABLE CHEST 1 VIEW COMPARISON:  04/17/2019 FINDINGS: There is stable Port-A-Cath bilaterally. The heart size is unchanged from prior study. Calcified granulomas are noted bilaterally. There is no pneumothorax. No large pleural effusion. No focal infiltrate. IMPRESSION: No active disease. Electronically Signed   By: Constance Holster M.D.   On: 04/19/2019 05:38   Dg Chest Port 1 View  Result Date: 04/17/2019  CLINICAL DATA:  Chest pain. Sickle cell anemia. Asthma. EXAM: PORTABLE CHEST 1 VIEW COMPARISON:  10/02/2018 FINDINGS: Mildly enlarged cardiac silhouette without significant change. Stable bilateral jugular porta catheters. Clear lungs with normal vascularity. Unremarkable bones. IMPRESSION: Stable mild cardiomegaly. No acute abnormality. Electronically Signed   By: Claudie Revering M.D.   On: 04/17/2019 08:49    Microbiology: Recent Results (from the past 240 hour(s))  SARS Coronavirus 2 by RT PCR (hospital order, performed in [facility] hospital lab) Nasopharyngeal Nasopharyngeal Swab     Status: None   Collection Time: 04/19/19  5:01 AM   Specimen: Nasopharyngeal Swab  Result Value Ref Range Status   SARS Coronavirus 2 NEGATIVE NEGATIVE Final    Comment: (NOTE) If result is NEGATIVE SARS-CoV-2 target nucleic acids are NOT DETECTED. The SARS-CoV-2 RNA is generally detectable in upper and lower  respiratory  specimens during the acute phase of infection. The lowest  concentration of SARS-CoV-2 viral copies this assay can detect is 250  copies / mL. A negative result does not preclude SARS-CoV-2 infection  and should not be used as the sole basis for treatment or other  patient management decisions.  A negative result may occur with  improper specimen collection / handling, submission of specimen other  than nasopharyngeal swab, presence of viral mutation(s) within the  areas targeted by this assay, and inadequate number of viral copies  (<250 copies / mL). A negative result must be combined with clinical  observations, patient history, and epidemiological information. If result is POSITIVE SARS-CoV-2 target nucleic acids are DETECTED. The SARS-CoV-2 RNA is generally detectable in upper and lower  respiratory specimens dur ing the acute phase of infection.  Positive  results are indicative of active infection with SARS-CoV-2.  Clinical  correlation with patient history and other diagnostic information is  necessary to determine patient infection status.  Positive results do  not rule out bacterial infection or co-infection with other viruses. If result is PRESUMPTIVE POSTIVE SARS-CoV-2 nucleic acids MAY BE PRESENT.   A presumptive positive result was obtained on the submitted specimen  and confirmed on repeat testing.  While 2019 novel coronavirus  (SARS-CoV-2) nucleic acids may be present in the submitted sample  additional confirmatory testing may be necessary for epidemiological  and / or clinical management purposes  to differentiate between  SARS-CoV-2 and other Sarbecovirus currently known to infect humans.  If clinically indicated additional testing with an alternate test  methodology [phone]) is advised. The SARS-CoV-2 RNA is generally  detectable in upper and lower respiratory sp ecimens during the acute  phase of infection. The expected result is Negative. Fact Sheet for  Patients:  StrictlyIdeas.no Fact Sheet for Healthcare Providers: BankingDealers.co.za This test is not yet approved or cleared by the [place] FDA and has been authorized for detection and/or diagnosis of SARS-CoV-2 by FDA under an Emergency Use Authorization (EUA).  This EUA will remain in effect (meaning this test can be used) for the duration of the COVID-19 declaration under Section 564(b)(1) of the Act, 21 U.S.C. section 360bbb-3(b)(1), unless the authorization is terminated or revoked sooner. Performed at [facility], [place] [address], [place], [place] [address]      Labs: Basic Metabolic Panel: Recent Labs  Lab 04/20/19 0522 04/25/19 0526  NA 136 138  K 3.9 3.8  CL 111 106  CO2 20* 24  GLUCOSE 119* 84  BUN 8 7  CREATININE 0.56 0.53  CALCIUM 8.4* 8.4*   Liver Function Tests: Recent  Labs  Lab 04/25/19 0526  AST 25  ALT 18  ALKPHOS 49  BILITOT 2.2*  PROT 7.1  ALBUMIN 3.9   No results for input(s): LIPASE, AMYLASE in the last 168 hours. No results for input(s): AMMONIA in the last 168 hours. CBC: Recent Labs  Lab 04/20/19 0522 04/21/19 0551 04/22/19 0928 04/23/19 0730 04/25/19 0526  WBC 19.0*  --  18.7* 16.5* 14.2*  NEUTROABS  --   --   --   --  8.9*  HGB 6.1* 7.4* 7.2* 7.4* 7.4*  HCT 17.9* 22.2* 22.1* 22.1* 22.6*  MCV 89.5  --  93.2 92.9 92.6  PLT 607*  --  605* 633* 613*   Cardiac Enzymes: No results for input(s): CKTOTAL, CKMB, CKMBINDEX, TROPONINI in the last 168 hours. BNP: Invalid input(s): POCBNP CBG: No results for input(s): GLUCAP in the last 168 hours.  Time coordinating discharge: 50 minutes  Signed:  [name]  APRN, MSN, FNP-C [facility] [address] [place], [place] [address] [phone]  [facility] 04/26/2019, 3:10 PM    This note was prepared using Dragon speech recognition software,  errors in dictation are unintentional.

## 2019-05-01 ENCOUNTER — Emergency Department (HOSPITAL_COMMUNITY)
Admission: EM | Admit: 2019-05-01 | Discharge: 2019-05-01 | Disposition: A | Discharge status: Home / Self Care | Payer: Medicare Other | Attending: Emergency Medicine | Admitting: Emergency Medicine

## 2019-05-01 ENCOUNTER — Encounter (HOSPITAL_COMMUNITY): Payer: Self-pay | Admitting: Emergency Medicine

## 2019-05-01 ENCOUNTER — Other Ambulatory Visit: Payer: Self-pay

## 2019-05-01 ENCOUNTER — Emergency Department (HOSPITAL_COMMUNITY): Payer: Medicare Other

## 2019-05-01 DIAGNOSIS — D5700 Hb-SS disease with crisis, unspecified: Secondary | ICD-10-CM | POA: Insufficient documentation

## 2019-05-01 DIAGNOSIS — Z79899 Other long term (current) drug therapy: Secondary | ICD-10-CM | POA: Diagnosis not present

## 2019-05-01 DIAGNOSIS — M545 Low back pain: Secondary | ICD-10-CM | POA: Diagnosis present

## 2019-05-01 DIAGNOSIS — J45909 Unspecified asthma, uncomplicated: Secondary | ICD-10-CM | POA: Insufficient documentation

## 2019-05-01 LAB — CBC WITH DIFFERENTIAL/PLATELET
Abs Immature Granulocytes: 0.06 10*3/uL (ref 0.00–0.07)
Basophils Absolute: 0.1 10*3/uL (ref 0.0–0.1)
Basophils Relative: 1 %
Eosinophils Absolute: 0.1 10*3/uL (ref 0.0–0.5)
Eosinophils Relative: 1 %
HCT: 24.4 % — ABNORMAL LOW (ref 36.0–46.0)
Hemoglobin: 8 g/dL — ABNORMAL LOW (ref 12.0–15.0)
Immature Granulocytes: 0 %
Lymphocytes Relative: 25 %
Lymphs Abs: 3.6 10*3/uL (ref 0.7–4.0)
MCH: 30 pg (ref 26.0–34.0)
MCHC: 32.8 g/dL (ref 30.0–36.0)
MCV: 91.4 fL (ref 80.0–100.0)
Monocytes Absolute: 1.8 10*3/uL — ABNORMAL HIGH (ref 0.1–1.0)
Monocytes Relative: 12 %
Neutro Abs: 8.9 10*3/uL — ABNORMAL HIGH (ref 1.7–7.7)
Neutrophils Relative %: 61 %
Platelets: 547 10*3/uL — ABNORMAL HIGH (ref 150–400)
RBC: 2.67 MIL/uL — ABNORMAL LOW (ref 3.87–5.11)
RDW: 19.2 % — ABNORMAL HIGH (ref 11.5–15.5)
WBC: 14.6 10*3/uL — ABNORMAL HIGH (ref 4.0–10.5)
nRBC: 0.2 % (ref 0.0–0.2)

## 2019-05-01 LAB — COMPREHENSIVE METABOLIC PANEL
ALT: 25 U/L (ref 0–44)
AST: 38 U/L (ref 15–41)
Albumin: 4.5 g/dL (ref 3.5–5.0)
Alkaline Phosphatase: 70 U/L (ref 38–126)
Anion gap: 9 (ref 5–15)
BUN: 8 mg/dL (ref 6–20)
CO2: 20 mmol/L — ABNORMAL LOW (ref 22–32)
Calcium: 9.2 mg/dL (ref 8.9–10.3)
Chloride: 107 mmol/L (ref 98–111)
Creatinine, Ser: 0.35 mg/dL — ABNORMAL LOW (ref 0.44–1.00)
GFR calc Af Amer: >60 mL/min (ref >60)
GFR calc non Af Amer: >60 mL/min (ref >60)
Glucose, Bld: 97 mg/dL (ref 70–99)
Potassium: 3.8 mmol/L (ref 3.5–5.1)
Sodium: 136 mmol/L (ref 135–145)
Total Bilirubin: 4.2 mg/dL — ABNORMAL HIGH (ref 0.3–1.2)
Total Protein: 8.5 g/dL — ABNORMAL HIGH (ref 6.5–8.1)

## 2019-05-01 LAB — RETICULOCYTES
Immature Retic Fract: 18.1 % — ABNORMAL HIGH (ref 2.3–15.9)
RBC.: 2.67 MIL/uL — ABNORMAL LOW (ref 3.87–5.11)
Retic Count, Absolute: 214.4 10*3/uL — ABNORMAL HIGH (ref 19.0–186.0)
Retic Ct Pct: 8 % — ABNORMAL HIGH (ref 0.4–3.1)

## 2019-05-01 LAB — I-STAT BETA HCG BLOOD, ED (MC, WL, AP ONLY): I-stat hCG, quantitative: <5 m[IU]/mL (ref <5)

## 2019-05-01 MED ORDER — HYDROMORPHONE HCL 2 MG/ML IJ SOLN: 2.0000 mg | INTRAMUSCULAR | Status: AC

## 2019-05-01 MED ORDER — DIPHENHYDRAMINE HCL 50 MG/ML IJ SOLN
25.0000 mg | Freq: Once | INTRAMUSCULAR | Status: AC
  Administered 2019-05-01: 25 mg via INTRAVENOUS
  Filled 2019-05-01: qty 1

## 2019-05-01 MED ORDER — HEPARIN SOD (PORK) LOCK FLUSH 100 UNIT/ML IV SOLN: 500.0000 [IU] | Freq: Once | INTRAVENOUS | Status: DC

## 2019-05-01 MED ORDER — DIPHENHYDRAMINE HCL 50 MG/ML IJ SOLN
12.5000 mg | Freq: Once | INTRAMUSCULAR | Status: AC
  Administered 2019-05-01: 12.5 mg via INTRAVENOUS
  Filled 2019-05-01: qty 1

## 2019-05-01 MED ORDER — HEPARIN SOD (PORK) LOCK FLUSH 100 UNIT/ML IV SOLN
500.0000 [IU] | Freq: Once | INTRAVENOUS | Status: AC
  Administered 2019-05-01: 500 [IU]
  Filled 2019-05-01: qty 5

## 2019-05-01 MED ORDER — ONDANSETRON HCL 4 MG/2ML IJ SOLN
4.0000 mg | INTRAMUSCULAR | Status: DC | PRN
  Administered 2019-05-01: 4 mg via INTRAVENOUS
  Filled 2019-05-01: qty 2

## 2019-05-01 MED ORDER — SODIUM CHLORIDE 0.9% FLUSH: 3.0000 mL | Freq: Once | INTRAVENOUS | Status: DC

## 2019-05-01 MED ORDER — HYDROMORPHONE HCL 2 MG/ML IJ SOLN
2.0000 mg | INTRAMUSCULAR | Status: AC
  Administered 2019-05-01: 2 mg via INTRAVENOUS
  Filled 2019-05-01: qty 1

## 2019-05-01 MED ORDER — DEXTROSE-NACL 5-0.45 % IV SOLN
INTRAVENOUS | Status: DC
  Administered 2019-05-01: 15:00:00 via INTRAVENOUS

## 2019-05-01 NOTE — ED Triage Notes (Signed)
Pt c/o sickle cell pain in back. Pt was recently d/c from [facility] and states has not improved since d/c.

## 2019-05-01 NOTE — Discharge Instructions (Addendum)
Please return for any problem.  Follow-up with your regular care provider as instructed. °

## 2019-05-01 NOTE — ED Provider Notes (Signed)
[facility] DEPT Provider Note   CSN: [id number] Arrival date & time: 05/01/19  1300     History   Chief Complaint Chief Complaint  Patient presents with   Sickle Cell Pain Crisis   Back Pain    HPI Molly Gutierrez is a 38 y.o. female.     38 year old female with prior medical history as detailed below presents for evaluation of sickle cell pain.  Patient with longstanding history of sickle cell.  Patient reports painful crisis.  She complains of pain to the low back and bilateral legs.  Pain as described is consistent with her prior sickle cell crises.  She reports taking oral narcotics at home without improvement in her symptoms.  She denies fever.  She denies chest pain or shortness of breath.  The history is provided by the patient.  Sickle Cell Pain Crisis Location:  Lower extremity Severity:  Mild Onset quality:  Gradual Duration:  5 days Similar to previous crisis episodes: yes   Timing:  Constant Progression:  Waxing and waning Chronicity:  New Relieved by:  Nothing Worsened by:  Nothing Back Pain   Past Medical History:  Diagnosis Date   Asthma    Eczema    Sickle cell anemia ([place])     Patient Active Problem List   Diagnosis Date Noted   Breast mass in female    Sickle cell pain crisis ([place]) 04/19/2019   Leukocytosis 04/19/2019   Chronic pain syndrome 04/19/2019   History of pulmonary embolism 04/19/2019    History reviewed. No pertinent surgical history.   OB History   No obstetric history on file.      Home Medications    Prior to Admission medications   Medication Sig Start Date End Date Taking? Authorizing Provider  albuterol (VENTOLIN HFA) 108 (90 Base) MCG/ACT inhaler Inhale 2 puffs into the lungs 2 (two) times daily as needed for wheezing or shortness of breath. 12/31/16  Yes [provider]  Cholecalciferol (VITAMIN D3) 25 MCG (1000 UT) CAPS Take 1,000 Units by mouth daily.   Yes  [provider]  Deferasirox 360 MG TABS Take 1,080 mg by mouth daily. 09/27/16  Yes [provider]  diphenhydrAMINE (BENADRYL) 25 mg capsule Take 25 mg by mouth 3 (three) times daily as needed for itching.   Yes [provider]  folic acid (FOLVITE) 1 MG tablet Take 1 mg by mouth daily. 12/31/16  Yes [provider]  HYDROmorphone (DILAUDID) 4 MG tablet Take 1 tablet (4 mg total) by mouth every 6 (six) hours as needed for severe pain. 04/26/19  Yes [name], FNP  mirtazapine (REMERON) 45 MG tablet Take 45 mg by mouth at bedtime. 09/30/18  Yes [provider]  mometasone-formoterol (DULERA) 100-5 MCG/ACT AERO Inhale 2 puffs into the lungs daily as needed for wheezing or shortness of breath.   Yes [provider]  omeprazole (PRILOSEC) 20 MG capsule Take 20 mg by mouth 2 (two) times a day. 09/08/18  Yes [provider]  promethazine (PHENERGAN) 25 MG tablet Take 25 mg by mouth every 6 (six) hours as needed for nausea/vomiting.   Yes [provider]  vitamin B-12 (CYANOCOBALAMIN) 1000 MCG tablet Take 1,000 mcg by mouth daily.   Yes [provider]  voxelotor (OXBRYTA) 500 MG TABS tablet Take 1,500 mg by mouth daily.   Yes [provider]  XARELTO 20 MG TABS tablet Take 20 mg by mouth daily. 12/16/18  Yes [provider]    Family History No family history on file.  Social History Social History   Tobacco Use   Smoking status: Never Smoker   Smokeless tobacco: Never Used  Substance Use Topics   Alcohol use: Never    Frequency: Never   Drug use: Never     Allergies   Cefaclor and Hydroxyurea   Review of Systems Review of Systems  Musculoskeletal: Positive for back pain.  All other systems reviewed and are negative.    Physical Exam Updated Vital Signs BP 109/79    Pulse 80    Temp 99 F (37.2 C) (Oral)    Resp 17    Ht 5\' 3"  (1.6 m)    Wt 55.8 kg    LMP 04/25/2019    SpO2  94%    BMI 21.79 kg/m   Physical Exam Vitals signs and nursing note reviewed.  Constitutional:      General: She is not in acute distress.    Appearance: Normal appearance. She is well-developed.  HENT:     Head: Normocephalic and atraumatic.  Eyes:     Conjunctiva/sclera: Conjunctivae normal.     Pupils: Pupils are equal, round, and reactive to light.  Neck:     Musculoskeletal: Normal range of motion and neck supple.  Cardiovascular:     Rate and Rhythm: Normal rate and regular rhythm.     Heart sounds: Normal heart sounds.  Pulmonary:     Effort: Pulmonary effort is normal. No respiratory distress.     Breath sounds: Normal breath sounds.  Abdominal:     General: There is no distension.     Palpations: Abdomen is soft.     Tenderness: There is no abdominal tenderness.  Musculoskeletal: Normal range of motion.        General: No deformity.  Skin:    General: Skin is warm and dry.  Neurological:     General: No focal deficit present.     Mental Status: She is alert and oriented to person, place, and time. Mental status is at baseline.      ED Treatments / Results  Labs (all labs ordered are listed, but only abnormal results are displayed) Labs Reviewed  COMPREHENSIVE METABOLIC PANEL - Abnormal; Notable for the following components:      Result Value   CO2 20 (*)    Creatinine, Ser 0.35 (*)    Total Protein 8.5 (*)    Total Bilirubin 4.2 (*)    All other components within normal limits  CBC WITH DIFFERENTIAL/PLATELET - Abnormal; Notable for the following components:   WBC 14.6 (*)    RBC 2.67 (*)    Hemoglobin 8.0 (*)    HCT 24.4 (*)    RDW 19.2 (*)    Platelets 547 (*)    Neutro Abs 8.9 (*)    Monocytes Absolute 1.8 (*)    All other components within normal limits  RETICULOCYTES - Abnormal; Notable for the following components:   Retic Ct Pct 8.0 (*)    RBC. 2.67 (*)    Retic Count, Absolute 214.4 (*)    Immature Retic Fract 18.1 (*)    All other  components within normal limits  I-STAT BETA HCG BLOOD, ED ([facility], [facility], [facility] ONLY)    EKG EKG Interpretation  Date/Time:  Saturday May 01 2019 16:00:23 EST Ventricular Rate:  80 PR Interval:    QRS Duration: 87 QT Interval:  424 QTC Calculation: 490 R Axis:   47 Text  Interpretation: Sinus rhythm No significant change since 04/19/2019 Confirmed by Dene Gentry [phone]) on 05/01/2019 4:08:57 PM   Radiology Dg Chest Port 1 View  Result Date: 05/01/2019 CLINICAL DATA:  Sickle cell disease, back pain, weakness EXAM: PORTABLE CHEST 1 VIEW COMPARISON:  04/22/2019 chest radiograph. FINDINGS: Stable configuration of bilateral internal jugular central venous catheters terminating in the lower third of the SVC on the right and at the cavoatrial junction on the left. Stable cardiomediastinal silhouette with mild cardiomegaly. No pneumothorax. No pleural effusion. No pulmonary edema. No acute consolidative airspace disease. Minimal reticular opacities at the right lung base appears similar. IMPRESSION: Mild cardiomegaly without pulmonary edema. No acute consolidative airspace disease. Minimal chronic right lung base reticular opacities favoring scarring. Electronically Signed   By: Ilona Sorrel M.D.   On: 05/01/2019 14:18    Procedures Procedures (including critical care time)  Medications Ordered in ED Medications  sodium chloride flush (NS) 0.9 % injection 3 mL (0 mLs Intravenous Hold 05/01/19 1409)  dextrose 5 %-0.45 % sodium chloride infusion ( Intravenous New Bag/Given 05/01/19 1435)  ondansetron (ZOFRAN) injection 4 mg (4 mg Intravenous Given 05/01/19 1437)  HYDROmorphone (DILAUDID) injection 2 mg (2 mg Intravenous Given 05/01/19 1437)    Or  HYDROmorphone (DILAUDID) injection 2 mg ( Subcutaneous See Alternative 05/01/19 1437)  HYDROmorphone (DILAUDID) injection 2 mg (2 mg Intravenous Given 05/01/19 1522)    Or  HYDROmorphone (DILAUDID) injection 2 mg ( Subcutaneous See Alternative  05/01/19 1522)  HYDROmorphone (DILAUDID) injection 2 mg (2 mg Intravenous Given 05/01/19 1712)    Or  HYDROmorphone (DILAUDID) injection 2 mg ( Subcutaneous See Alternative 05/01/19 1712)  diphenhydrAMINE (BENADRYL) injection 25 mg (25 mg Intravenous Given 05/01/19 1437)  diphenhydrAMINE (BENADRYL) injection 12.5 mg (12.5 mg Intravenous Given 05/01/19 1713)     Initial Impression / Assessment and Plan / ED Course  I have reviewed the triage vital signs and the nursing notes.  Pertinent labs & imaging results that were available during my care of the patient were reviewed by me and considered in my medical decision making (see chart for details).        MDM  Screen complete  Jamyia Loggins was evaluated in Emergency Department on 05/01/2019 for the symptoms described in the history of present illness. She was evaluated in the context of the global COVID-19 pandemic, which necessitated consideration that the patient might be at risk for infection with the SARS-CoV-2 virus that causes COVID-19. Institutional protocols and algorithms that pertain to the evaluation of patients at risk for COVID-19 are in a state of rapid change based on information released by regulatory bodies including the CDC and federal and state organizations. These policies and algorithms were followed during the patient's care in the ED.   Patient is presenting for evaluation of her sickle cell crisis.  Patient's pain as described is consistent with her prior sickle cell crises.  Screening labs did not reveal significant abnormality.  Following treatment in the ED she now feels improved.  She desires discharge home.  She does understand the need for close follow-up.  Strict return precautions given and understood.   Final Clinical Impressions(s) / ED Diagnoses   Final diagnoses:  Sickle cell pain crisis [facility])    ED Discharge Orders    None       Valarie Merino, MD 05/01/19 1755

## 2019-05-04 ENCOUNTER — Other Ambulatory Visit: Payer: Self-pay

## 2019-05-04 ENCOUNTER — Emergency Department (HOSPITAL_COMMUNITY): Payer: Medicare Other

## 2019-05-04 ENCOUNTER — Inpatient Hospital Stay (HOSPITAL_COMMUNITY)
Admission: EM | Admit: 2019-05-04 | Discharge: 2019-05-15 | DRG: 812 | Disposition: A | Discharge status: Home / Self Care | Payer: Medicare Other | Attending: Internal Medicine | Admitting: Internal Medicine

## 2019-05-04 ENCOUNTER — Encounter (HOSPITAL_COMMUNITY): Payer: Self-pay | Admitting: Emergency Medicine

## 2019-05-04 DIAGNOSIS — D473 Essential (hemorrhagic) thrombocythemia: Secondary | ICD-10-CM

## 2019-05-04 DIAGNOSIS — D5700 Hb-SS disease with crisis, unspecified: Secondary | ICD-10-CM | POA: Diagnosis not present

## 2019-05-04 DIAGNOSIS — Z86718 Personal history of other venous thrombosis and embolism: Secondary | ICD-10-CM

## 2019-05-04 DIAGNOSIS — E876 Hypokalemia: Secondary | ICD-10-CM

## 2019-05-04 DIAGNOSIS — Z7901 Long term (current) use of anticoagulants: Secondary | ICD-10-CM

## 2019-05-04 DIAGNOSIS — D72829 Elevated white blood cell count, unspecified: Secondary | ICD-10-CM | POA: Diagnosis present

## 2019-05-04 DIAGNOSIS — G894 Chronic pain syndrome: Secondary | ICD-10-CM | POA: Diagnosis present

## 2019-05-04 DIAGNOSIS — J4520 Mild intermittent asthma, uncomplicated: Secondary | ICD-10-CM | POA: Diagnosis present

## 2019-05-04 DIAGNOSIS — Z79899 Other long term (current) drug therapy: Secondary | ICD-10-CM

## 2019-05-04 DIAGNOSIS — L299 Pruritus, unspecified: Secondary | ICD-10-CM | POA: Diagnosis present

## 2019-05-04 DIAGNOSIS — T402X5A Adverse effect of other opioids, initial encounter: Secondary | ICD-10-CM | POA: Diagnosis present

## 2019-05-04 DIAGNOSIS — Z86711 Personal history of pulmonary embolism: Secondary | ICD-10-CM | POA: Diagnosis present

## 2019-05-04 LAB — COMPREHENSIVE METABOLIC PANEL
ALT: 23 U/L (ref 0–44)
AST: 28 U/L (ref 15–41)
Albumin: 4.3 g/dL (ref 3.5–5.0)
Alkaline Phosphatase: 66 U/L (ref 38–126)
Anion gap: 9 (ref 5–15)
BUN: 8 mg/dL (ref 6–20)
CO2: 22 mmol/L (ref 22–32)
Calcium: 8.8 mg/dL — ABNORMAL LOW (ref 8.9–10.3)
Chloride: 107 mmol/L (ref 98–111)
Creatinine, Ser: 0.45 mg/dL (ref 0.44–1.00)
GFR calc Af Amer: >60 mL/min (ref >60)
GFR calc non Af Amer: >60 mL/min (ref >60)
Glucose, Bld: 85 mg/dL (ref 70–99)
Potassium: 3.4 mmol/L — ABNORMAL LOW (ref 3.5–5.1)
Sodium: 138 mmol/L (ref 135–145)
Total Bilirubin: 3.7 mg/dL — ABNORMAL HIGH (ref 0.3–1.2)
Total Protein: 8.1 g/dL (ref 6.5–8.1)

## 2019-05-04 LAB — CBC WITH DIFFERENTIAL/PLATELET
Abs Immature Granulocytes: 0.09 10*3/uL — ABNORMAL HIGH (ref 0.00–0.07)
Basophils Absolute: 0.1 10*3/uL (ref 0.0–0.1)
Basophils Relative: 0 %
Eosinophils Absolute: 0.1 10*3/uL (ref 0.0–0.5)
Eosinophils Relative: 1 %
HCT: 23.9 % — ABNORMAL LOW (ref 36.0–46.0)
Hemoglobin: 7.8 g/dL — ABNORMAL LOW (ref 12.0–15.0)
Immature Granulocytes: 1 %
Lymphocytes Relative: 27 %
Lymphs Abs: 4.8 10*3/uL — ABNORMAL HIGH (ref 0.7–4.0)
MCH: 30.2 pg (ref 26.0–34.0)
MCHC: 32.6 g/dL (ref 30.0–36.0)
MCV: 92.6 fL (ref 80.0–100.0)
Monocytes Absolute: 2.2 10*3/uL — ABNORMAL HIGH (ref 0.1–1.0)
Monocytes Relative: 12 %
Neutro Abs: 10.8 10*3/uL — ABNORMAL HIGH (ref 1.7–7.7)
Neutrophils Relative %: 59 %
Platelets: 661 10*3/uL — ABNORMAL HIGH (ref 150–400)
RBC: 2.58 MIL/uL — ABNORMAL LOW (ref 3.87–5.11)
RDW: 19.3 % — ABNORMAL HIGH (ref 11.5–15.5)
WBC: 18.2 10*3/uL — ABNORMAL HIGH (ref 4.0–10.5)
nRBC: 0.3 % — ABNORMAL HIGH (ref 0.0–0.2)

## 2019-05-04 LAB — I-STAT BETA HCG BLOOD, ED (MC, WL, AP ONLY): I-stat hCG, quantitative: <5 m[IU]/mL (ref <5)

## 2019-05-04 LAB — RETICULOCYTES
Immature Retic Fract: 21.2 % — ABNORMAL HIGH (ref 2.3–15.9)
RBC.: 2.58 MIL/uL — ABNORMAL LOW (ref 3.87–5.11)
Retic Count, Absolute: 394.5 10*3/uL — ABNORMAL HIGH (ref 19.0–186.0)
Retic Ct Pct: 15.2 % — ABNORMAL HIGH (ref 0.4–3.1)

## 2019-05-04 MED ORDER — SODIUM CHLORIDE 0.9 % IV BOLUS
1000.0000 mL | Freq: Once | INTRAVENOUS | Status: AC
  Administered 2019-05-04: 1000 mL via INTRAVENOUS

## 2019-05-04 MED ORDER — DIPHENHYDRAMINE HCL 25 MG PO CAPS
25.0000 mg | ORAL_CAPSULE | Freq: Once | ORAL | Status: AC
  Administered 2019-05-04: 25 mg via ORAL
  Filled 2019-05-04: qty 1

## 2019-05-04 MED ORDER — ONDANSETRON HCL 4 MG/2ML IJ SOLN
4.0000 mg | Freq: Once | INTRAMUSCULAR | Status: AC
  Administered 2019-05-04: 4 mg via INTRAVENOUS
  Filled 2019-05-04: qty 2

## 2019-05-04 MED ORDER — HYDROMORPHONE HCL 2 MG/ML IJ SOLN
2.0000 mg | Freq: Once | INTRAMUSCULAR | Status: AC
  Administered 2019-05-04: 2 mg via INTRAVENOUS
  Filled 2019-05-04: qty 1

## 2019-05-04 MED ORDER — KETOROLAC TROMETHAMINE 30 MG/ML IJ SOLN
30.0000 mg | Freq: Once | INTRAMUSCULAR | Status: AC
  Administered 2019-05-04: 30 mg via INTRAVENOUS
  Filled 2019-05-04: qty 1

## 2019-05-04 MED ORDER — DIPHENHYDRAMINE HCL 50 MG/ML IJ SOLN
25.0000 mg | Freq: Once | INTRAMUSCULAR | Status: AC
  Administered 2019-05-04: 25 mg via INTRAVENOUS
  Filled 2019-05-04: qty 1

## 2019-05-04 NOTE — ED Provider Notes (Addendum)
[facility] DEPT Provider Note   CSN: [id number] Arrival date & time: 05/04/19  1740     History   Chief Complaint Chief Complaint  Patient presents with  . Sickle Cell Pain Crisis    HPI Molly Gutierrez is a 38 y.o. female.     Patient is a 38 year old female with history of sickle cell disease.  She presents for evaluation of pain in her upper back the past several days.  This is consistent with her prior sickle cell crises.  She denies any fever or cough.  She denies any shortness of breath.  The history is provided by the patient.  Sickle Cell Pain Crisis Location:  Back Severity:  Severe Onset quality:  Gradual Duration:  3 days Timing:  Constant Progression:  Worsening Chronicity:  Recurrent Context: not infection and not non-compliance   Relieved by:  Nothing Worsened by:  Nothing Ineffective treatments:  None tried Associated symptoms: no fatigue, no fever and no shortness of breath     Past Medical History:  Diagnosis Date  . Asthma   . Eczema   . Sickle cell anemia [facility])     Patient Active Problem List   Diagnosis Date Noted  . Breast mass in female   . Sickle cell pain crisis ([place]) 04/19/2019  . Leukocytosis 04/19/2019  . Chronic pain syndrome 04/19/2019  . History of pulmonary embolism 04/19/2019    History reviewed. No pertinent surgical history.   OB History   No obstetric history on file.      Home Medications    Prior to Admission medications   Medication Sig Start Date End Date Taking? Authorizing Provider  albuterol (VENTOLIN HFA) 108 (90 Base) MCG/ACT inhaler Inhale 2 puffs into the lungs 2 (two) times daily as needed for wheezing or shortness of breath. 12/31/16   [provider]  Cholecalciferol (VITAMIN D3) 25 MCG (1000 UT) CAPS Take 1,000 Units by mouth daily.    [provider]  Deferasirox 360 MG TABS Take 1,080 mg by mouth daily. 09/27/16   [provider]   diphenhydrAMINE (BENADRYL) 25 mg capsule Take 25 mg by mouth 3 (three) times daily as needed for itching.    [provider]  folic acid (FOLVITE) 1 MG tablet Take 1 mg by mouth daily. 12/31/16   [provider]  HYDROmorphone (DILAUDID) 4 MG tablet Take 1 tablet (4 mg total) by mouth every 6 (six) hours as needed for severe pain. 04/26/19   [name], FNP  mirtazapine (REMERON) 45 MG tablet Take 45 mg by mouth at bedtime. 09/30/18   [provider]  mometasone-formoterol (DULERA) 100-5 MCG/ACT AERO Inhale 2 puffs into the lungs daily as needed for wheezing or shortness of breath.    [provider]  omeprazole (PRILOSEC) 20 MG capsule Take 20 mg by mouth 2 (two) times a day. 09/08/18   [provider]  promethazine (PHENERGAN) 25 MG tablet Take 25 mg by mouth every 6 (six) hours as needed for nausea/vomiting.    [provider]  vitamin B-12 (CYANOCOBALAMIN) 1000 MCG tablet Take 1,000 mcg by mouth daily.    [provider]  voxelotor (OXBRYTA) 500 MG TABS tablet Take 1,500 mg by mouth daily.    [provider]  XARELTO 20 MG TABS tablet Take 20 mg by mouth daily. 12/16/18   [provider]    Family History No family history on file.  Social History Social History   Tobacco Use  .  Smoking status: Never Smoker  . Smokeless tobacco: Never Used  Substance Use Topics  . Alcohol use: Never    Frequency: Never  . Drug use: Never     Allergies   Cefaclor and Hydroxyurea   Review of Systems Review of Systems  Constitutional: Negative for fatigue and fever.  Respiratory: Negative for shortness of breath.   All other systems reviewed and are negative.    Physical Exam Updated Vital Signs BP 108/71 (BP Location: Left Arm)   Pulse (!) 111   Temp 99.4 F (37.4 C) (Oral)   Resp 18   LMP 04/25/2019   SpO2 96%   Physical Exam Vitals signs and nursing note reviewed.  Constitutional:      General:  She is not in acute distress.    Appearance: She is well-developed. She is not diaphoretic.  HENT:     Head: Normocephalic and atraumatic.  Neck:     Musculoskeletal: Normal range of motion and neck supple.  Cardiovascular:     Rate and Rhythm: Normal rate and regular rhythm.     Heart sounds: No murmur. No friction rub. No gallop.   Pulmonary:     Effort: Pulmonary effort is normal. No respiratory distress.     Breath sounds: Normal breath sounds. No wheezing.  Abdominal:     General: Bowel sounds are normal. There is no distension.     Palpations: Abdomen is soft.     Tenderness: There is no abdominal tenderness.  Musculoskeletal: Normal range of motion.  Skin:    General: Skin is warm and dry.  Neurological:     Mental Status: She is alert and oriented to person, place, and time.      ED Treatments / Results  Labs (all labs ordered are listed, but only abnormal results are displayed) Labs Reviewed  COMPREHENSIVE METABOLIC PANEL  CBC WITH DIFFERENTIAL/PLATELET  RETICULOCYTES  I-STAT BETA HCG BLOOD, ED ([facility], [facility], [facility] ONLY)  I-STAT BETA HCG BLOOD, ED ([facility], [facility], [facility] ONLY)    EKG None  Radiology No results found.  Procedures Procedures (including critical care time)  Medications Ordered in ED Medications  sodium chloride 0.9 % bolus 1,000 mL (has no administration in time range)  ondansetron (ZOFRAN) injection 4 mg (has no administration in time range)  HYDROmorphone (DILAUDID) injection 2 mg (has no administration in time range)  diphenhydrAMINE (BENADRYL) injection 25 mg (has no administration in time range)  ketorolac (TORADOL) 30 MG/ML injection 30 mg (has no administration in time range)     Initial Impression / Assessment and Plan / ED Course  I have reviewed the triage vital signs and the nursing notes.  Pertinent labs & imaging results that were available during my care of the patient were reviewed by me and considered in my medical decision making (see chart  for details).  Patient presenting here with complaints of pain in her upper back consistent with prior sickle cell crises.  Presenting with pain she rates as 9 out of 10 and has only improved to 8 out of 10 after receiving multiple doses of Dilaudid, Toradol, and Benadryl.  Patient's laboratory studies show consistency with prior studies.  Chest x-ray shows no acute process.  Patient to be admitted for pain control.  I have spoken with the hospitalist who agrees to admit.  CRITICAL CARE Performed by: Veryl Speak Total critical care time: 35 minutes Critical care time was exclusive of separately billable procedures and treating other patients. Critical care was necessary to  treat or prevent imminent or life-threatening deterioration. Critical care was time spent personally by me on the following activities: development of treatment plan with patient and/or surrogate as well as nursing, discussions with consultants, evaluation of patient's response to treatment, examination of patient, obtaining history from patient or surrogate, ordering and performing treatments and interventions, ordering and review of laboratory studies, ordering and review of radiographic studies, pulse oximetry and re-evaluation of patient's condition.   Final Clinical Impressions(s) / ED Diagnoses   Final diagnoses:  None    ED Discharge Orders    None       Veryl Speak, MD 05/04/19 2229    Veryl Speak, MD 05/12/19 1320

## 2019-05-04 NOTE — ED Notes (Signed)
ED TO INPATIENT HANDOFF REPORT  ED Nurse Name and Phone #: Gara Kroner, RN  S Name/Age/Gender Molly Gutierrez 38 y.o. female Room/Bed: WA08/WA08  Code Status   Code Status: Prior  Home/SNF/Other Home Patient oriented to: self, place, time and situation Is this baseline? Yes   Triage Complete: Triage complete  Chief Complaint Sickle cell crisis, Back Pain, Chest Pain  Triage Note Per pt, states chest and back pain since last Friday-states she is unable to keep her home meds down   Allergies Allergies  Allergen Reactions  . Cefaclor Hives and Swelling  . Hydroxyurea Other (See Comments) and Palpitations    Lower blood levels and HR Other reaction(s): Hypotension, Other (See Comments) "it messes me up, it drops my levels and stuff" Lower blood levels and HR "it messes me up, it drops my levels and stuff"     Level of Care/Admitting Diagnosis ED Disposition    ED Disposition Condition [place]: [place] [[id number]]  Level of Care: Med-Surg [16]  Covid Evaluation: Asymptomatic Screening Protocol (No Symptoms)  Diagnosis: Sickle cell pain crisis [facility]) [id number]  Admitting Physician: [name] [id number]  Attending Physician: [name] [id number]  PT Class (Do Not Modify): Observation [104]  PT Acc Code (Do Not Modify): Observation [10022]       B Medical/Surgery History Past Medical History:  Diagnosis Date  . Asthma   . Eczema   . Sickle cell anemia ([facility])    History reviewed. No pertinent surgical history.   A IV Location/Drains/Wounds Patient Lines/Drains/Airways Status   Active Line/Drains/Airways    Name:   Placement date:   Placement time:   Site:   Days:   Implanted Port 10/02/18 Right Chest   10/02/18    2118    Chest   214          Intake/Output Last 24 hours No intake or output data in the 24 hours ending 05/04/19 2313  Labs/Imaging Results for orders placed or performed  during the hospital encounter of 05/04/19 (from the past 48 hour(s))  Comprehensive metabolic panel     Status: Abnormal   Collection Time: 05/04/19  6:40 PM  Result Value Ref Range   Sodium 138 135 - 145 mmol/L   Potassium 3.4 (L) 3.5 - 5.1 mmol/L   Chloride 107 98 - 111 mmol/L   CO2 22 22 - 32 mmol/L   Glucose, Bld 85 70 - 99 mg/dL   BUN 8 6 - 20 mg/dL   Creatinine, Ser 0.45 0.44 - 1.00 mg/dL   Calcium 8.8 (L) 8.9 - 10.3 mg/dL   Total Protein 8.1 6.5 - 8.1 g/dL   Albumin 4.3 3.5 - 5.0 g/dL   AST 28 15 - 41 U/L   ALT 23 0 - 44 U/L   Alkaline Phosphatase 66 38 - 126 U/L   Total Bilirubin 3.7 (H) 0.3 - 1.2 mg/dL   GFR calc non Af Amer >60 >60 mL/min   GFR calc Af Amer >60 >60 mL/min   Anion gap 9 5 - 15    Comment: Performed at [facility], [place] [address], [place], [place] [address]  CBC with Differential     Status: Abnormal   Collection Time: 05/04/19  6:40 PM  Result Value Ref Range   WBC 18.2 (H) 4.0 - 10.5 K/uL   RBC 2.58 (L) 3.87 - 5.11 MIL/uL   Hemoglobin 7.8 (L) 12.0 - 15.0 g/dL   HCT 23.9 (  L) 36.0 - 46.0 %   MCV 92.6 80.0 - 100.0 fL   MCH 30.2 26.0 - 34.0 pg   MCHC 32.6 30.0 - 36.0 g/dL   RDW 19.3 (H) 11.5 - 15.5 %   Platelets 661 (H) 150 - 400 K/uL   nRBC 0.3 (H) 0.0 - 0.2 %   Neutrophils Relative % 59 %   Neutro Abs 10.8 (H) 1.7 - 7.7 K/uL   Lymphocytes Relative 27 %   Lymphs Abs 4.8 (H) 0.7 - 4.0 K/uL   Monocytes Relative 12 %   Monocytes Absolute 2.2 (H) 0.1 - 1.0 K/uL   Eosinophils Relative 1 %   Eosinophils Absolute 0.1 0.0 - 0.5 K/uL   Basophils Relative 0 %   Basophils Absolute 0.1 0.0 - 0.1 K/uL   Immature Granulocytes 1 %   Abs Immature Granulocytes 0.09 (H) 0.00 - 0.07 K/uL    Comment: Performed at [facility], [place] [address], [place], [place] [address]  Reticulocytes     Status: Abnormal   Collection Time: 05/04/19  6:40 PM  Result Value Ref Range   Retic Ct Pct 15.2 (H) 0.4 - 3.1 %   RBC. 2.58 (L) 3.87 -  5.11 MIL/uL   Retic Count, Absolute 394.5 (H) 19.0 - 186.0 K/uL   Immature Retic Fract 21.2 (H) 2.3 - 15.9 %    Comment: Performed at [facility], [place] [address], [place], [place] [address]  I-Stat beta hCG blood, ED     Status: None   Collection Time: 05/04/19  6:57 PM  Result Value Ref Range   I-stat hCG, quantitative <5.0 <5 mIU/mL   Comment 3            Comment:   GEST. AGE      CONC.  (mIU/mL)   <=1 WEEK        5 - 50     2 WEEKS       50 - 500     3 WEEKS       100 - 10,000     4 WEEKS     1,000 - 30,000        FEMALE AND NON-PREGNANT FEMALE:     LESS THAN 5 mIU/mL    Dg Chest 2 View  Result Date: 05/04/2019 CLINICAL DATA:  Chest pain EXAM: CHEST - 2 VIEW COMPARISON:  05/01/2019 FINDINGS: Bilateral Port-A-Cath remain in place, unchanged. Cardiomegaly with vascular congestion. No confluent opacities, effusions or edema. No acute bony abnormality. IMPRESSION: Cardiomegaly, mild vascular congestion. Electronically Signed   By: Rolm Baptise M.D.   On: 05/04/2019 19:30    Pending Labs Unresulted Labs (From admission, onward)    Start     Ordered   05/04/19 2147  SARS CORONAVIRUS 2 (TAT 6-24 HRS) Nasopharyngeal Nasopharyngeal Swab  (Asymptomatic/Tier 2 Patients Labs)  Once,   STAT    Question Answer Comment  Is this test for diagnosis or screening Screening   Symptomatic for COVID-19 as defined by CDC No   Hospitalized for COVID-19 No   Admitted to ICU for COVID-19 No   Previously tested for COVID-19 Yes   Resident in a congregate (group) care setting No   Employed in healthcare setting No   Pregnant No      05/04/19 2146          Vitals/Pain Today's Vitals   05/04/19 2100 05/04/19 2130 05/04/19 2258 05/04/19 2301  BP: 108/70 95/65  91/61  Pulse: 96 89  80  Resp:    17  Temp:    98.7 F (37.1 C)  TempSrc:    Oral  SpO2: 96% 95%  96%  PainSc:  8  8      Isolation Precautions No active isolations  Medications Medications  sodium chloride  0.9 % bolus 1,000 mL (0 mLs Intravenous Stopped 05/04/19 2012)  ondansetron (ZOFRAN) injection 4 mg (4 mg Intravenous Given 05/04/19 1843)  HYDROmorphone (DILAUDID) injection 2 mg (2 mg Intravenous Given 05/04/19 1844)  diphenhydrAMINE (BENADRYL) injection 25 mg (25 mg Intravenous Given 05/04/19 1843)  ketorolac (TORADOL) 30 MG/ML injection 30 mg (30 mg Intravenous Given 05/04/19 1843)  HYDROmorphone (DILAUDID) injection 2 mg (2 mg Intravenous Given 05/04/19 2016)  diphenhydrAMINE (BENADRYL) capsule 25 mg (25 mg Oral Given 05/04/19 2021)  HYDROmorphone (DILAUDID) injection 2 mg (2 mg Intravenous Given 05/04/19 2100)    Mobility walks Low fall risk   Focused Assessments N/A   R Recommendations: See Admitting Provider Note  Report given to: Estill Bamberg, RN  Additional Notes: N/A

## 2019-05-04 NOTE — ED Triage Notes (Signed)
Per pt, states chest and back pain since last Friday-states she is unable to keep her home meds down

## 2019-05-04 NOTE — ED Notes (Signed)
Attempted to give report. RN will return call.

## 2019-05-05 ENCOUNTER — Other Ambulatory Visit: Payer: Self-pay

## 2019-05-05 ENCOUNTER — Encounter (HOSPITAL_COMMUNITY): Payer: Self-pay | Admitting: Internal Medicine

## 2019-05-05 DIAGNOSIS — G894 Chronic pain syndrome: Secondary | ICD-10-CM | POA: Diagnosis present

## 2019-05-05 DIAGNOSIS — Z86718 Personal history of other venous thrombosis and embolism: Secondary | ICD-10-CM | POA: Diagnosis not present

## 2019-05-05 DIAGNOSIS — D5700 Hb-SS disease with crisis, unspecified: Secondary | ICD-10-CM | POA: Diagnosis present

## 2019-05-05 DIAGNOSIS — Z86711 Personal history of pulmonary embolism: Secondary | ICD-10-CM | POA: Diagnosis not present

## 2019-05-05 DIAGNOSIS — D72829 Elevated white blood cell count, unspecified: Secondary | ICD-10-CM | POA: Diagnosis present

## 2019-05-05 DIAGNOSIS — Z7901 Long term (current) use of anticoagulants: Secondary | ICD-10-CM | POA: Diagnosis not present

## 2019-05-05 DIAGNOSIS — D473 Essential (hemorrhagic) thrombocythemia: Secondary | ICD-10-CM | POA: Diagnosis present

## 2019-05-05 DIAGNOSIS — E876 Hypokalemia: Secondary | ICD-10-CM

## 2019-05-05 DIAGNOSIS — Z79899 Other long term (current) drug therapy: Secondary | ICD-10-CM | POA: Diagnosis not present

## 2019-05-05 DIAGNOSIS — J4520 Mild intermittent asthma, uncomplicated: Secondary | ICD-10-CM | POA: Diagnosis present

## 2019-05-05 DIAGNOSIS — L299 Pruritus, unspecified: Secondary | ICD-10-CM | POA: Diagnosis present

## 2019-05-05 DIAGNOSIS — T402X5A Adverse effect of other opioids, initial encounter: Secondary | ICD-10-CM | POA: Diagnosis present

## 2019-05-05 LAB — CBC
HCT: 20.5 % — ABNORMAL LOW (ref 36.0–46.0)
Hemoglobin: 6.6 g/dL — CL (ref 12.0–15.0)
MCH: 30.3 pg (ref 26.0–34.0)
MCHC: 32.2 g/dL (ref 30.0–36.0)
MCV: 94 fL (ref 80.0–100.0)
Platelets: 565 10*3/uL — ABNORMAL HIGH (ref 150–400)
RBC: 2.18 MIL/uL — ABNORMAL LOW (ref 3.87–5.11)
RDW: 19.5 % — ABNORMAL HIGH (ref 11.5–15.5)
WBC: 16.9 10*3/uL — ABNORMAL HIGH (ref 4.0–10.5)
nRBC: 0.3 % — ABNORMAL HIGH (ref 0.0–0.2)

## 2019-05-05 LAB — MAGNESIUM: Magnesium: 2 mg/dL (ref 1.7–2.4)

## 2019-05-05 LAB — BRAIN NATRIURETIC PEPTIDE: B Natriuretic Peptide: 70.6 pg/mL (ref 0.0–100.0)

## 2019-05-05 MED ORDER — ONDANSETRON HCL 4 MG/2ML IJ SOLN: 4.0000 mg | Freq: Four times a day (QID) | INTRAMUSCULAR | Status: DC | PRN

## 2019-05-05 MED ORDER — MIRTAZAPINE 15 MG PO TABS
45.0000 mg | ORAL_TABLET | Freq: Every day | ORAL | Status: DC
  Administered 2019-05-05 – 2019-05-14 (×11): 45 mg via ORAL
  Filled 2019-05-05 (×11): qty 3

## 2019-05-05 MED ORDER — VITAMIN B-12 1000 MCG PO TABS
1000.0000 ug | ORAL_TABLET | Freq: Every day | ORAL | Status: DC
  Administered 2019-05-05 – 2019-05-15 (×11): 1000 ug via ORAL
  Filled 2019-05-05 (×11): qty 1

## 2019-05-05 MED ORDER — CHLORHEXIDINE GLUCONATE CLOTH 2 % EX PADS
6.0000 | MEDICATED_PAD | Freq: Every day | CUTANEOUS | Status: DC
  Administered 2019-05-05 – 2019-05-15 (×9): 6 via TOPICAL

## 2019-05-05 MED ORDER — DIPHENHYDRAMINE HCL 12.5 MG/5ML PO ELIX: 12.5000 mg | ORAL_SOLUTION | Freq: Four times a day (QID) | ORAL | Status: DC | PRN

## 2019-05-05 MED ORDER — KETOROLAC TROMETHAMINE 30 MG/ML IJ SOLN
30.0000 mg | Freq: Four times a day (QID) | INTRAMUSCULAR | Status: DC
  Administered 2019-05-05: 30 mg via INTRAVENOUS
  Filled 2019-05-05: qty 1

## 2019-05-05 MED ORDER — HYDROMORPHONE 1 MG/ML IV SOLN
INTRAVENOUS | Status: DC
  Administered 2019-05-05: 1.8 mg via INTRAVENOUS
  Administered 2019-05-05: 0.5 mg via INTRAVENOUS
  Administered 2019-05-05: 4.5 mg via INTRAVENOUS
  Administered 2019-05-06: 2.5 mg via INTRAVENOUS
  Administered 2019-05-06: 2 mg via INTRAVENOUS
  Administered 2019-05-06: 3 mg via INTRAVENOUS
  Administered 2019-05-06: 4 mg via INTRAVENOUS
  Administered 2019-05-06: 30 mg via INTRAVENOUS
  Administered 2019-05-06: 2.5 mg via INTRAVENOUS
  Administered 2019-05-07 (×2): 4.5 mg via INTRAVENOUS
  Administered 2019-05-07: 1 mg via INTRAVENOUS
  Administered 2019-05-07: 0.5 mg via INTRAVENOUS
  Filled 2019-05-05: qty 30

## 2019-05-05 MED ORDER — DIPHENHYDRAMINE HCL 25 MG PO CAPS
25.0000 mg | ORAL_CAPSULE | ORAL | Status: DC | PRN
  Administered 2019-05-15: 25 mg via ORAL
  Filled 2019-05-05 (×3): qty 1

## 2019-05-05 MED ORDER — POLYETHYLENE GLYCOL 3350 17 G PO PACK: 17.0000 g | PACK | Freq: Every day | ORAL | Status: DC | PRN

## 2019-05-05 MED ORDER — PANTOPRAZOLE SODIUM 40 MG PO TBEC
40.0000 mg | DELAYED_RELEASE_TABLET | Freq: Two times a day (BID) | ORAL | Status: DC
  Administered 2019-05-05 – 2019-05-15 (×21): 40 mg via ORAL
  Filled 2019-05-05 (×21): qty 1

## 2019-05-05 MED ORDER — SENNOSIDES-DOCUSATE SODIUM 8.6-50 MG PO TABS
1.0000 | ORAL_TABLET | Freq: Two times a day (BID) | ORAL | Status: DC
  Administered 2019-05-05 – 2019-05-15 (×22): 1 via ORAL
  Filled 2019-05-05 (×22): qty 1

## 2019-05-05 MED ORDER — DEFERASIROX 360 MG PO TABS
1080.0000 mg | ORAL_TABLET | Freq: Every day | ORAL | Status: DC
  Filled 2019-05-05: qty 3

## 2019-05-05 MED ORDER — SODIUM CHLORIDE 0.9% FLUSH: 9.0000 mL | INTRAVENOUS | Status: DC | PRN

## 2019-05-05 MED ORDER — SODIUM CHLORIDE 0.9 % IV SOLN
25.0000 mg | INTRAVENOUS | Status: DC | PRN
  Administered 2019-05-05 – 2019-05-15 (×28): 25 mg via INTRAVENOUS
  Filled 2019-05-05: qty 25
  Filled 2019-05-05 (×2): qty 0.5
  Filled 2019-05-05: qty 25
  Filled 2019-05-05: qty 0.5
  Filled 2019-05-05: qty 25
  Filled 2019-05-05: qty 0.5
  Filled 2019-05-05: qty 25
  Filled 2019-05-05: qty 0.5
  Filled 2019-05-05: qty 25
  Filled 2019-05-05 (×2): qty 0.5
  Filled 2019-05-05 (×3): qty 25
  Filled 2019-05-05: qty 0.5
  Filled 2019-05-05 (×4): qty 25
  Filled 2019-05-05 (×3): qty 0.5
  Filled 2019-05-05: qty 25
  Filled 2019-05-05: qty 0.5
  Filled 2019-05-05 (×5): qty 25
  Filled 2019-05-05: qty 0.5
  Filled 2019-05-05 (×5): qty 25

## 2019-05-05 MED ORDER — ALBUTEROL SULFATE HFA 108 (90 BASE) MCG/ACT IN AERS: 1.0000 | INHALATION_SPRAY | RESPIRATORY_TRACT | Status: DC | PRN

## 2019-05-05 MED ORDER — RIVAROXABAN 20 MG PO TABS
20.0000 mg | ORAL_TABLET | Freq: Every day | ORAL | Status: DC
  Administered 2019-05-05 – 2019-05-15 (×11): 20 mg via ORAL
  Filled 2019-05-05 (×11): qty 1

## 2019-05-05 MED ORDER — FOLIC ACID 1 MG PO TABS
1.0000 mg | ORAL_TABLET | Freq: Every day | ORAL | Status: DC
  Administered 2019-05-05 – 2019-05-15 (×11): 1 mg via ORAL
  Filled 2019-05-05 (×11): qty 1

## 2019-05-05 MED ORDER — VITAMIN D 25 MCG (1000 UNIT) PO TABS
1000.0000 [IU] | ORAL_TABLET | Freq: Every day | ORAL | Status: DC
  Administered 2019-05-05 – 2019-05-15 (×11): 1000 [IU] via ORAL
  Filled 2019-05-05 (×11): qty 1

## 2019-05-05 MED ORDER — VOXELOTOR 500 MG PO TABS
1500.0000 mg | ORAL_TABLET | Freq: Every day | ORAL | Status: DC
  Filled 2019-05-05: qty 3

## 2019-05-05 MED ORDER — SODIUM CHLORIDE 0.9% FLUSH
10.0000 mL | INTRAVENOUS | Status: DC | PRN
  Administered 2019-05-07 – 2019-05-15 (×4): 10 mL
  Filled 2019-05-05 (×4): qty 40

## 2019-05-05 MED ORDER — MOMETASONE FURO-FORMOTEROL FUM 100-5 MCG/ACT IN AERO
2.0000 | INHALATION_SPRAY | Freq: Every day | RESPIRATORY_TRACT | Status: DC | PRN
  Filled 2019-05-05: qty 8.8

## 2019-05-05 MED ORDER — NALOXONE HCL 0.4 MG/ML IJ SOLN: 0.4000 mg | INTRAMUSCULAR | Status: DC | PRN

## 2019-05-05 MED ORDER — DIPHENHYDRAMINE HCL 50 MG/ML IJ SOLN
12.5000 mg | Freq: Four times a day (QID) | INTRAMUSCULAR | Status: DC | PRN
  Administered 2019-05-05 (×2): 12.5 mg via INTRAVENOUS
  Filled 2019-05-05 (×2): qty 1

## 2019-05-05 MED ORDER — KETOROLAC TROMETHAMINE 30 MG/ML IJ SOLN
15.0000 mg | Freq: Four times a day (QID) | INTRAMUSCULAR | Status: DC
  Administered 2019-05-05 – 2019-05-10 (×19): 15 mg via INTRAVENOUS
  Filled 2019-05-05 (×19): qty 1

## 2019-05-05 MED ORDER — POTASSIUM CHLORIDE CRYS ER 20 MEQ PO TBCR
40.0000 meq | EXTENDED_RELEASE_TABLET | Freq: Once | ORAL | Status: AC
  Administered 2019-05-05: 40 meq via ORAL
  Filled 2019-05-05: qty 2

## 2019-05-05 MED ORDER — HYDROMORPHONE 1 MG/ML IV SOLN
INTRAVENOUS | Status: DC
  Administered 2019-05-05: 0.9 mg via INTRAVENOUS
  Administered 2019-05-05: 1.8 mg via INTRAVENOUS
  Administered 2019-05-05: 30 mg via INTRAVENOUS
  Administered 2019-05-05: 1.5 mg via INTRAVENOUS
  Filled 2019-05-05: qty 30

## 2019-05-05 NOTE — H&P (Signed)
History and Physical    [facility] DQ:[id number] DOB: 1980/06/27 DOA: 05/04/2019  PCP: System, Provider Not In Patient coming from: Home  Chief Complaint: Chest pain  HPI: Molly Gutierrez is a 38 y.o. 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  Patient states for the past 4 days she is having pain on both sides of her chest and it radiates to her posterior ribs on the left side.  The pain is severe, aching, and constant.  States this is similar to the pain she usually experiences with her sickle cell crisis.  No recent falls or injury to her chest.  Denies fevers, shortness of breath, or cough.  States she goes to a sickle cell clinic in [place].  Her pain has been uncontrolled despite taking her home Dilaudid.  States doses of IV Dilaudid she received in the ED have helped but at present she is having 8 out of 10 intensity pain.  She is requesting Benadryl for itching with Dilaudid.  Patient has no other complaints.  ED Course: Afebrile.  Initially tachycardic on arrival, now improved.  Blood pressure intermittently soft, improved with 1 L normal saline bolus.  Not hypoxic.  EKG not suggestive of ACS.  WBC count 18.2.  Hemoglobin 7.8, baseline in the 7-8 range.  Reticulocyte count chronically elevated, now slightly worse.  Chest x-ray showing cardiomegaly with mild vascular congestion.  No confluent opacities, effusions, or edema. Patient received a total of 6 mg Dilaudid and Toradol 30 mg and continued to complain of pain.  Review of Systems:  All systems reviewed and apart from history of presenting illness, are negative.  Past Medical History:  Diagnosis Date  . Asthma   . Eczema   . Sickle cell anemia (HCC)     History reviewed. No pertinent surgical history.   reports that she has never smoked. She has never used smokeless tobacco. She  reports that she does not drink alcohol or use drugs.  Allergies  Allergen Reactions  . Cefaclor Hives and Swelling  . Hydroxyurea Other (See Comments) and Palpitations    Lower blood levels and HR Other reaction(s): Hypotension, Other (See Comments) "it messes me up, it drops my levels and stuff" Lower blood levels and HR "it messes me up, it drops my levels and stuff"     History reviewed. No pertinent family history.  Prior to Admission medications   Medication Sig Start Date End Date Taking? Authorizing Provider  albuterol (VENTOLIN HFA) 108 (90 Base) MCG/ACT inhaler Inhale 2 puffs into the lungs 2 (two) times daily as needed for wheezing or shortness of breath. 12/31/16  Yes [provider]  Cholecalciferol (VITAMIN D3) 25 MCG (1000 UT) CAPS Take 1,000 Units by mouth daily.   Yes [provider]  Deferasirox 360 MG TABS Take 1,080 mg by mouth daily. 09/27/16  Yes [provider]  diphenhydrAMINE (BENADRYL) 25 mg capsule Take 25 mg by mouth 3 (three) times daily as needed for itching.   Yes [provider]  folic acid (FOLVITE) 1 MG tablet Take 1 mg by mouth daily. 12/31/16  Yes [provider]  HYDROmorphone (DILAUDID) 4 MG tablet Take 1 tablet (4 mg total) by mouth every 6 (six) hours as needed for severe pain. 04/26/19  Yes [name], FNP  mirtazapine (REMERON) 45 MG tablet Take 45 mg by mouth at bedtime. 09/30/18  Yes [provider]  mometasone-formoterol (DULERA) 100-5 MCG/ACT AERO Inhale 2 puffs into the lungs daily as needed for wheezing or shortness of breath.   Yes [provider]  omeprazole (PRILOSEC) 20 MG capsule Take 20 mg by mouth 2 (two) times a day. 09/08/18  Yes [provider]  promethazine (PHENERGAN) 25 MG tablet Take 25 mg by mouth every 6 (six) hours as needed for nausea/vomiting.   Yes [provider]  vitamin B-12 (CYANOCOBALAMIN) 1000 MCG tablet Take 1,000 mcg by mouth daily.    Yes [provider]  voxelotor (OXBRYTA) 500 MG TABS tablet Take 1,500 mg by mouth daily.   Yes [provider]  XARELTO 20 MG TABS tablet Take 20 mg by mouth daily. 12/16/18  Yes [provider]    Physical Exam: Vitals:   05/04/19 2100 05/04/19 2130 05/04/19 2301 05/05/19 0009  BP: 108/70 95/65 91/61  103/69  Pulse: 96 89 80 84  Resp:   17 18  Temp:   98.7 F (37.1 C) 98.8 F (37.1 C)  TempSrc:   Oral Oral  SpO2: 96% 95% 96% 97%  Weight:   51.1 kg   Height:   5\' 3"  (1.6 m)     Physical Exam  Constitutional: She is oriented to person, place, and time. She appears well-developed and well-nourished. No distress.  HENT:  Head: Normocephalic.  Eyes: Right eye exhibits no discharge. Left eye exhibits no discharge.  Neck: Neck supple.  Cardiovascular: Normal rate, regular rhythm and intact distal pulses.  Pulmonary/Chest: Effort normal and breath sounds normal. No respiratory distress. She has no wheezes. She has no rales.  Abdominal: Soft. Bowel sounds are normal. She exhibits no distension. There is no abdominal tenderness. There is no guarding.  Musculoskeletal:        General: No edema.  Neurological: She is alert and oriented to person, place, and time.  Skin: Skin is warm and dry. She is not diaphoretic.     Labs on Admission: I have personally reviewed following labs and imaging studies  CBC: Recent Labs  Lab 05/01/19 1312 05/04/19 1840  WBC 14.6* 18.2*  NEUTROABS 8.9* 10.8*  HGB 8.0* 7.8*  HCT 24.4* 23.9*  MCV 91.4 92.6  PLT 547* [id number]*   Basic Metabolic Panel: Recent Labs  Lab 05/01/19 1312 05/04/19 1840  NA 136 138  K 3.8 3.4*  CL 107 107  CO2 20* 22  GLUCOSE 97 85  BUN 8 8  CREATININE 0.35* 0.45  CALCIUM 9.2 8.8*   GFR: Estimated Creatinine Clearance: 77.7 mL/min (by C-G formula based on SCr of 0.45 mg/dL). Liver Function Tests: Recent Labs  Lab 05/01/19 1312 05/04/19 1840  AST 38 28  ALT 25 23  ALKPHOS 70 66   BILITOT 4.2* 3.7*  PROT 8.5* 8.1  ALBUMIN 4.5 4.3   No results for input(s): LIPASE, AMYLASE in the last 168 hours. No results for input(s): AMMONIA in the last 168 hours. Coagulation Profile: No results for input(s): INR, PROTIME in the last 168 hours. Cardiac Enzymes: No results for input(s): CKTOTAL, CKMB, CKMBINDEX, TROPONINI in the last 168 hours. BNP (last 3 results) No results for input(s): PROBNP in the last 8760 hours. HbA1C: No results for input(s): HGBA1C in the last 72 hours. CBG: No results for input(s): GLUCAP in the last 168 hours. Lipid Profile: No results for input(s): CHOL, HDL, LDLCALC, TRIG, CHOLHDL, LDLDIRECT in the last 72 hours. Thyroid Function Tests: No results for input(s): TSH, T4TOTAL, FREET4, T3FREE, THYROIDAB in the last  72 hours. Anemia Panel: Recent Labs    05/04/19 1840  RETICCTPCT 15.2*   Urine analysis: No results found for: COLORURINE, APPEARANCEUR, LABSPEC, PHURINE, GLUCOSEU, HGBUR, BILIRUBINUR, KETONESUR, PROTEINUR, UROBILINOGEN, NITRITE, LEUKOCYTESUR  Radiological Exams on Admission: Dg Chest 2 View  Result Date: 05/04/2019 CLINICAL DATA:  Chest pain EXAM: CHEST - 2 VIEW COMPARISON:  05/01/2019 FINDINGS: Bilateral Port-A-Cath remain in place, unchanged. Cardiomegaly with vascular congestion. No confluent opacities, effusions or edema. No acute bony abnormality. IMPRESSION: Cardiomegaly, mild vascular congestion. Electronically Signed   By: Rolm Baptise M.D.   On: 05/04/2019 19:30    EKG: Independently reviewed.  Sinus rhythm, no significant change since prior tracing.  Assessment/Plan Principal Problem:   Sickle cell pain crisis (HCC) Active Problems:   Leukocytosis   History of pulmonary embolism   Hypokalemia   Thrombocytosis (HCC)   Sickle cell pain crisis Hemoglobin 7.8, baseline in the 7-8 range.  Reticulocyte count chronically elevated, now slightly worse.  Acute chest syndrome less likely given no fever, hypoxia, or  infiltrates on chest x-ray.  Patient continues to complain of severe pain despite receiving a total of 6 mg Dilaudid in the ED. -Dilaudid full dose PCA -Benadryl as needed for itching with Dilaudid -Toradol 30 mg every 6 hours -Received 1 L normal saline bolus in the ED. Will hold off giving additional fluid as chest x-ray showing mild vascular congestion. -Continue home medications including Deferasirox, voxelotor, folic acid, vitamin [id number] -Continuous pulse ox  Chronic leukocytosis Likely reactive.  WBC count 18.2.  Patient is afebrile.  Chest x-ray without evidence of pneumonia.  Patient is not endorsing UTI symptoms. -Continue to monitor  ?Vascular congestion on CXR No history of CHF.  Labs not suggestive of acute renal failure.  Does not appear volume overloaded on exam.  Chest x-ray showing cardiomegaly with mild vascular congestion.   -Check BNP level  Chronic thrombocytosis Possibly reactive.  Platelet count 661,000, not significantly changed from baseline. -Continue to monitor  Mild hypokalemia Potassium 3.4. -Replete potassium.  Check magnesium level and replete if low.  Continue to monitor electrolytes.  Chronically elevated T bili -Likely related to hemolysis.  T bili 3.7, not significantly changed from baseline.  History of PE/ DVT on chronic anticoagulation -Continue home Xarelto  DVT prophylaxis: Xarelto Code Status: Full code Family Communication: No family available. Disposition Plan: Anticipate discharge after clinical improvement. Consults called: None Admission status: It is my clinical opinion that referral for OBSERVATION is reasonable and necessary in this patient based on the above information provided. The aforementioned taken together are felt to place the patient at high risk for further clinical deterioration. However it is anticipated that the patient may be medically stable for discharge from the hospital within 24 to 48 hours.  The medical decision  making on this patient was of high complexity and the patient is at high risk for clinical deterioration, therefore this is a level 3 visit.  [name] MD [facility] Pager [phone]  If 7PM-7AM, please contact night-coverage [email or web address] Password [email or web address]  05/05/2019, 1:34 AM

## 2019-05-05 NOTE — Progress Notes (Signed)
CRITICAL VALUE ALERT  Critical Value:  hgb 6.6  Date & Time Notied:  05/05/19 0245  Provider Notified: Brandon Melnick  Orders Received/Actions taken: No orders given at this time

## 2019-05-05 NOTE — Progress Notes (Addendum)
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  Patient primarily has pain to mid chest that radiates to posterior ribs on her left side.  She characterizes pain as aching and constant.  She says that pain has not improved at all on IV Dilaudid PCA.  Her current pain intensity is 10/10.  Patient writhing in pain.   Plan: Discontinue full dose PCA.  Add weightbase IV Dilaudid PCA with settings of 0.5 mg, 10-minute lockout, and 3 mg/h with a 1 mg loading dose. Decreased Toradol to 15 mg from 30 mg IV every 6 hours for a total of 5 days. Patient's hemoglobin is 6.6, which is below her baseline of 7.0-8.0 g/dL.  Decrease IV fluids, repeat CBC and type and screen in a.m.  If hemoglobin remains below 7, transfuse 1 unit of PRBCs.   [name]  APRN, MSN, FNP-C Patient [facility] [address] [place], [place] [address] [phone]

## 2019-05-06 DIAGNOSIS — D5700 Hb-SS disease with crisis, unspecified: Principal | ICD-10-CM

## 2019-05-06 DIAGNOSIS — E876 Hypokalemia: Secondary | ICD-10-CM

## 2019-05-06 DIAGNOSIS — Z86711 Personal history of pulmonary embolism: Secondary | ICD-10-CM

## 2019-05-06 DIAGNOSIS — D72829 Elevated white blood cell count, unspecified: Secondary | ICD-10-CM

## 2019-05-06 LAB — CBC
HCT: 20.4 % — ABNORMAL LOW (ref 36.0–46.0)
Hemoglobin: 6.6 g/dL — CL (ref 12.0–15.0)
MCH: 30.4 pg (ref 26.0–34.0)
MCHC: 32.4 g/dL (ref 30.0–36.0)
MCV: 94 fL (ref 80.0–100.0)
Platelets: 606 10*3/uL — ABNORMAL HIGH (ref 150–400)
RBC: 2.17 MIL/uL — ABNORMAL LOW (ref 3.87–5.11)
RDW: 20.4 % — ABNORMAL HIGH (ref 11.5–15.5)
WBC: 18.1 10*3/uL — ABNORMAL HIGH (ref 4.0–10.5)
nRBC: 0.4 % — ABNORMAL HIGH (ref 0.0–0.2)

## 2019-05-06 LAB — HEMOGLOBIN AND HEMATOCRIT, BLOOD
HCT: 24.6 % — ABNORMAL LOW (ref 36.0–46.0)
Hemoglobin: 8.1 g/dL — ABNORMAL LOW (ref 12.0–15.0)

## 2019-05-06 LAB — PREPARE RBC (CROSSMATCH)

## 2019-05-06 MED ORDER — SODIUM CHLORIDE 0.9% IV SOLUTION
Freq: Once | INTRAVENOUS | Status: AC
  Administered 2019-05-06: 11:00:00 via INTRAVENOUS

## 2019-05-06 MED ORDER — DIPHENHYDRAMINE HCL 50 MG/ML IJ SOLN
25.0000 mg | Freq: Once | INTRAMUSCULAR | Status: AC
  Administered 2019-05-06: 25 mg via INTRAVENOUS
  Filled 2019-05-06: qty 1

## 2019-05-06 MED ORDER — ACETAMINOPHEN 325 MG PO TABS
650.0000 mg | ORAL_TABLET | Freq: Once | ORAL | Status: AC
  Administered 2019-05-06: 650 mg via ORAL
  Filled 2019-05-06: qty 2

## 2019-05-06 NOTE — Progress Notes (Signed)
Subjective: Molly Gutierrez, a 38 year old female with a medical history significant for sickle cell disease, chronic pain syndrome, opiate dependence and tolerance, history of PE on Xarelto, and history of mild intermittent asthma was admitted for sickle cell pain crisis. Patient states that pain intensity is around 8/10.  Pain is localized to mid chest.  She characterizes pain as constant and throbbing. Today, patient's hemoglobin is 6.6 which is below her baseline of 7.0-8.0 g/dL. Patient denies headache, blurred vision, paresthesias, dizziness, headache, urinary symptoms, nausea, vomiting, or diarrhea.  Objective:  Vital signs in last 24 hours:  Vitals:   05/06/19 1557 05/06/19 1709 05/06/19 2007 05/06/19 2105  BP:  105/73  118/86  Pulse:  87  92  Resp: 10 13 13 14   Temp:  98.8 F (37.1 C)  99.3 F (37.4 C)  TempSrc:  Oral  Oral  SpO2: 95% 96% 96% 97%  Weight:      Height:        Intake/Output from previous day:   Intake/Output Summary (Last 24 hours) at 05/06/2019 2307 Last data filed at 05/06/2019 2213 Gross per 24 hour  Intake 1731.53 ml  Output 1000 ml  Net 731.53 ml    Physical Exam: General: Alert, awake, oriented x3, in no acute distress.  HEENT: [place]/AT PEERL, EOMI Neck: Trachea midline,  no masses, no thyromegal,y no JVD, no carotid bruit OROPHARYNX:  Moist, No exudate/ erythema/lesions.  Heart: Regular rate and rhythm, without murmurs, rubs, gallops, PMI non-displaced, no heaves or thrills on palpation.  Lungs: Clear to auscultation, no wheezing or rhonchi noted. No increased vocal fremitus resonant to percussion  Abdomen: Soft, nontender, nondistended, positive bowel sounds, no masses no hepatosplenomegaly noted..  Neuro: No focal neurological deficits noted cranial nerves II through XII grossly intact. DTRs 2+ bilaterally upper and lower extremities. Strength 5 out of 5 in bilateral upper and lower extremities. Musculoskeletal: No warm swelling or  erythema around joints, no spinal tenderness noted. Psychiatric: Patient alert and oriented x3, good insight and cognition, good recent to remote recall. Lymph node survey: No cervical axillary or inguinal lymphadenopathy noted.  Lab Results:  Basic Metabolic Panel:    Component Value Date/Time   NA 138 05/04/2019 1840   K 3.4 (L) 05/04/2019 1840   CL 107 05/04/2019 1840   CO2 22 05/04/2019 1840   BUN 8 05/04/2019 1840   CREATININE 0.45 05/04/2019 1840   GLUCOSE 85 05/04/2019 1840   CALCIUM 8.8 (L) 05/04/2019 1840   CBC:    Component Value Date/Time   WBC 18.1 (H) 05/06/2019 0345   HGB 8.1 (L) 05/06/2019 1642   HCT 24.6 (L) 05/06/2019 1642   PLT 606 (H) 05/06/2019 0345   MCV 94.0 05/06/2019 0345   NEUTROABS 10.8 (H) 05/04/2019 1840   LYMPHSABS 4.8 (H) 05/04/2019 1840   MONOABS 2.2 (H) 05/04/2019 1840   EOSABS 0.1 05/04/2019 1840   BASOSABS 0.1 05/04/2019 1840    No results found for this or any previous visit (from the past 240 hour(s)).  Studies/Results: No results found.  Medications: Scheduled Meds: . Chlorhexidine Gluconate Cloth  6 each Topical Daily  . cholecalciferol  1,000 Units Oral Daily  . Deferasirox  1,080 mg Oral Daily  . folic acid  1 mg Oral Daily  . HYDROmorphone   Intravenous Q4H  . ketorolac  15 mg Intravenous Q6H  . mirtazapine  45 mg Oral QHS  . pantoprazole  40 mg Oral BID  . rivaroxaban  20 mg Oral Daily  .  senna-docusate  1 tablet Oral BID  . vitamin B-12  1,000 mcg Oral Daily  . voxelotor  1,500 mg Oral Daily   Continuous Infusions: . diphenhydrAMINE 25 mg (05/06/19 2213)   PRN Meds:.diphenhydrAMINE **OR** diphenhydrAMINE, mometasone-formoterol, naloxone **AND** sodium chloride flush, ondansetron (ZOFRAN) IV, polyethylene glycol, sodium chloride flush  Consultants:  none  Procedures:  none  Antibiotics:  none  Assessment/Plan: Principal Problem:   Sickle cell pain crisis ([place]) Active Problems:   Leukocytosis   History  of pulmonary embolism   Hypokalemia   Thrombocytosis (HCC)  Sickle cell disease with crisis: Continue with IV Dilaudid PCA, no change in settings on today Toradol 15 mg IV for total of 5 days Reevaluate pain scale regularly Monitor vital signs closely Maintain oxygen saturation above 90%, supplemental oxygen as needed.  Leukocytosis: WBCs 18,100.  Patient remains afebrile.  No signs of infection or inflammation.  Antibiotics not warranted at this time.  Sickle cell anemia: Hemoglobin 6.6 at his baseline.  Transfuse 1 unit PRBC.  CBC to follow  History of DVT/PE: Continue Xarelto.  Code Status: Full Code Family Communication: N/A Disposition Plan: Not yet ready for discharge  [place], MSN, FNP-C Patient [facility] [address] [place], [place] [address] [phone] If 5PM-7AM, please contact night-coverage.  05/06/2019, 11:07 PM  LOS: 1 day    This note was prepared using Dragon speech recognition software, errors in dictation are unintentional.

## 2019-05-07 DIAGNOSIS — D473 Essential (hemorrhagic) thrombocythemia: Secondary | ICD-10-CM

## 2019-05-07 LAB — BPAM RBC
Blood Product Expiration Date: 202012202359
ISSUE DATE / TIME: 202011191055
Unit Type and Rh: 5100

## 2019-05-07 LAB — TYPE AND SCREEN
ABO/RH(D): A POS
Antibody Screen: NEGATIVE
Unit division: 0

## 2019-05-07 LAB — CBC
HCT: 24.4 % — ABNORMAL LOW (ref 36.0–46.0)
Hemoglobin: 8.2 g/dL — ABNORMAL LOW (ref 12.0–15.0)
MCH: 30.4 pg (ref 26.0–34.0)
MCHC: 33.6 g/dL (ref 30.0–36.0)
MCV: 90.4 fL (ref 80.0–100.0)
Platelets: 561 10*3/uL — ABNORMAL HIGH (ref 150–400)
RBC: 2.7 MIL/uL — ABNORMAL LOW (ref 3.87–5.11)
RDW: 19.9 % — ABNORMAL HIGH (ref 11.5–15.5)
WBC: 15.4 10*3/uL — ABNORMAL HIGH (ref 4.0–10.5)
nRBC: 0.4 % — ABNORMAL HIGH (ref 0.0–0.2)

## 2019-05-07 MED ORDER — OXYCODONE HCL 5 MG PO TABS: 10.0000 mg | ORAL_TABLET | ORAL | Status: DC | PRN

## 2019-05-07 MED ORDER — HYDROMORPHONE 1 MG/ML IV SOLN
INTRAVENOUS | Status: DC
  Administered 2019-05-07: 3.5 mg via INTRAVENOUS
  Administered 2019-05-07: 4 mg via INTRAVENOUS
  Administered 2019-05-07: 3.5 mg via INTRAVENOUS
  Administered 2019-05-08: 4.5 mg via INTRAVENOUS
  Administered 2019-05-08: 1.5 mg via INTRAVENOUS
  Administered 2019-05-08: 30 mg via INTRAVENOUS
  Administered 2019-05-08: 4.5 mg via INTRAVENOUS
  Administered 2019-05-08: 1 mg via INTRAVENOUS
  Administered 2019-05-08: 5 mg via INTRAVENOUS
  Administered 2019-05-09: 2 mg via INTRAVENOUS
  Administered 2019-05-09: 4 mg via INTRAVENOUS
  Administered 2019-05-09: 60 mg via INTRAVENOUS
  Administered 2019-05-09: 6.5 mg via INTRAVENOUS
  Administered 2019-05-10: 30 mg via INTRAVENOUS
  Administered 2019-05-10: 2 mg via INTRAVENOUS
  Administered 2019-05-10: 1.5 mg via INTRAVENOUS
  Administered 2019-05-10: 2 mg via INTRAVENOUS
  Administered 2019-05-10: 1 mg via INTRAVENOUS
  Administered 2019-05-10: 4.5 mg via INTRAVENOUS
  Administered 2019-05-10: 4 mg via INTRAVENOUS
  Administered 2019-05-10: 3 mg via INTRAVENOUS
  Administered 2019-05-11: 2.5 mg via INTRAVENOUS
  Administered 2019-05-11: 5 mg via INTRAVENOUS
  Administered 2019-05-11: 4.5 mg via INTRAVENOUS
  Administered 2019-05-11: 4 mg via INTRAVENOUS
  Administered 2019-05-12: 0.5 mg via INTRAVENOUS
  Administered 2019-05-12: 1.5 mg via INTRAVENOUS
  Administered 2019-05-12: 8.5 mg via INTRAVENOUS
  Administered 2019-05-12: 30 mg via INTRAVENOUS
  Filled 2019-05-07 (×5): qty 30

## 2019-05-07 NOTE — Progress Notes (Signed)
Subjective: [facility], a 38 year old female with a medical history significant for sickle cell disease, chronic pain syndrome, opiate dependence and tolerance, history of PE on Xarelto, and history of mild intermittent asthma was admitted for sickle cell pain crisis.  Patient states the pain intensity has improved some overnight.  Patient rates pain as 7/10, localized to mid chest.  She characterizes pain as constant and throbbing.  Today, patient's hemoglobin is 8.1, she is status post 1 unit of PRBCs on 05/06/2019.  Patient denies headache, blurred vision, dizziness, paresthesias, headache, urinary symptoms, nausea, vomiting, or diarrhea. Objective:  Vital signs in last 24 hours:  Vitals:   05/07/19 0800 05/07/19 0946 05/07/19 1159 05/07/19 1334  BP:  108/84  (!) 119/104  Pulse:  80  90  Resp: 14 (!) 9 14 14   Temp:  99.2 F (37.3 C)  98.7 F (37.1 C)  TempSrc:  Oral  Oral  SpO2: 98% 97% 98% 96%  Weight:      Height:        Intake/Output from previous day:   Intake/Output Summary (Last 24 hours) at 05/07/2019 1655 Last data filed at 05/07/2019 1400 Gross per 24 hour  Intake 863.64 ml  Output 1250 ml  Net -386.36 ml    Physical Exam: General: Alert, awake, oriented x3, in no acute distress.  HEENT: [place]/AT PEERL, EOMI Neck: Trachea midline,  no masses, no thyromegal,y no JVD, no carotid bruit OROPHARYNX:  Moist, No exudate/ erythema/lesions.  Heart: Regular rate and rhythm, without murmurs, rubs, gallops, PMI non-displaced, no heaves or thrills on palpation.  Lungs: Clear to auscultation, no wheezing or rhonchi noted. No increased vocal fremitus resonant to percussion  Abdomen: Soft, nontender, nondistended, positive bowel sounds, no masses no hepatosplenomegaly noted..  Neuro: No focal neurological deficits noted cranial nerves II through XII grossly intact. DTRs 2+ bilaterally upper and lower extremities. Strength 5 out of 5 in bilateral upper and lower  extremities. Musculoskeletal: No warm swelling or erythema around joints, no spinal tenderness noted. Psychiatric: Patient alert and oriented x3, good insight and cognition, good recent to remote recall. Lymph node survey: No cervical axillary or inguinal lymphadenopathy noted.  Lab Results:  Basic Metabolic Panel:    Component Value Date/Time   NA 138 05/04/2019 1840   K 3.4 (L) 05/04/2019 1840   CL 107 05/04/2019 1840   CO2 22 05/04/2019 1840   BUN 8 05/04/2019 1840   CREATININE 0.45 05/04/2019 1840   GLUCOSE 85 05/04/2019 1840   CALCIUM 8.8 (L) 05/04/2019 1840   CBC:    Component Value Date/Time   WBC 15.4 (H) 05/07/2019 1233   HGB 8.2 (L) 05/07/2019 1233   HCT 24.4 (L) 05/07/2019 1233   PLT 561 (H) 05/07/2019 1233   MCV 90.4 05/07/2019 1233   NEUTROABS 10.8 (H) 05/04/2019 1840   LYMPHSABS 4.8 (H) 05/04/2019 1840   MONOABS 2.2 (H) 05/04/2019 1840   EOSABS 0.1 05/04/2019 1840   BASOSABS 0.1 05/04/2019 1840    No results found for this or any previous visit (from the past 240 hour(s)).  Studies/Results: No results found.  Medications: Scheduled Meds: . Chlorhexidine Gluconate Cloth  6 each Topical Daily  . cholecalciferol  1,000 Units Oral Daily  . Deferasirox  1,080 mg Oral Daily  . folic acid  1 mg Oral Daily  . HYDROmorphone   Intravenous Q4H  . ketorolac  15 mg Intravenous Q6H  . mirtazapine  45 mg Oral QHS  . pantoprazole  40 mg Oral BID  .  rivaroxaban  20 mg Oral Daily  . senna-docusate  1 tablet Oral BID  . vitamin B-12  1,000 mcg Oral Daily  . voxelotor  1,500 mg Oral Daily   Continuous Infusions: . diphenhydrAMINE 25 mg (05/07/19 1156)   PRN Meds:.diphenhydrAMINE **OR** diphenhydrAMINE, mometasone-formoterol, naloxone **AND** sodium chloride flush, ondansetron (ZOFRAN) IV, oxyCODONE, polyethylene glycol, sodium chloride flush  Consultants:  None  Procedures:  None  Antibiotics:  None  Assessment/Plan: Principal Problem:   Sickle cell  pain crisis ([place]) Active Problems:   Leukocytosis   History of pulmonary embolism   Hypokalemia   Thrombocytosis (HCC)  Sickle cell disease with pain crisis: Weaning IV Dilaudid PCA, settings changed to 0.5 mg, 10-minute lockout, and 2 mg/h Toradol 15 mg IV.  Reevaluate pain scale regularly.  Monitor vital signs closely.  Supplemental oxygen as needed.  Leukocytosis: Stable.  Patient remains afebrile.  No signs of infection or inflammation.  Continue to monitor closely.  Sickle cell anemia: Hemoglobin has returned to baseline.  Patient is s/p 1 unit PRBCs.  Continue to follow closely.  History of DVT/PE: Continue Xarelto   Code Status: Full Code Family Communication: N/A Disposition Plan: Not yet ready for discharge   [place], MSN, FNP-C Patient [place], [place] [address] [phone]  If 5PM-7AM, please contact night-coverage.  05/07/2019, 4:55 PM  LOS: 2 days

## 2019-05-07 NOTE — Care Management Important Message (Signed)
Important Message  Patient Details IM Letter given to Sharren Bridge SW to present to the Patient Name: Molly Gutierrez MRN: [id number] Date of Birth: 1981-01-27   Medicare Important Message Given:  Yes     Kerin Salen 05/07/2019, 10:35 AM

## 2019-05-08 LAB — CBC
HCT: 23.1 % — ABNORMAL LOW (ref 36.0–46.0)
Hemoglobin: 7.5 g/dL — ABNORMAL LOW (ref 12.0–15.0)
MCH: 29.8 pg (ref 26.0–34.0)
MCHC: 32.5 g/dL (ref 30.0–36.0)
MCV: 91.7 fL (ref 80.0–100.0)
Platelets: 538 10*3/uL — ABNORMAL HIGH (ref 150–400)
RBC: 2.52 MIL/uL — ABNORMAL LOW (ref 3.87–5.11)
RDW: 20 % — ABNORMAL HIGH (ref 11.5–15.5)
WBC: 13.3 10*3/uL — ABNORMAL HIGH (ref 4.0–10.5)
nRBC: 0.5 % — ABNORMAL HIGH (ref 0.0–0.2)

## 2019-05-08 LAB — RETICULOCYTES
Immature Retic Fract: 28.3 % — ABNORMAL HIGH (ref 2.3–15.9)
RBC.: 2.61 MIL/uL — ABNORMAL LOW (ref 3.87–5.11)
Retic Count, Absolute: 287.9 10*3/uL — ABNORMAL HIGH (ref 19.0–186.0)
Retic Ct Pct: 11 % — ABNORMAL HIGH (ref 0.4–3.1)

## 2019-05-08 LAB — LACTATE DEHYDROGENASE: LDH: 238 U/L — ABNORMAL HIGH (ref 98–192)

## 2019-05-08 NOTE — Progress Notes (Signed)
Subjective: 38 year old female with sickle cell disease and chronic pain syndrome who was admitted with sickle cell painful crisis.  She has had previous history of PEs on Xarelto.  Patient is still complaining of pain at 8 out of 10 in the lower back.  Also mid chest and legs.  She is on Dilaudid PCA dose of 2 mg/h.  She is also on Toradol at 50 mg every 6 hours.  She continues to complain of pain but no shortness of breath no fever or chills.  She is being admitted to the hospital for treatment..  Objective: Vital signs in last 24 hours: Temp:  [98.7 F (37.1 C)-99.3 F (37.4 C)] 98.9 F (37.2 C) (11/21 1351) Pulse Rate:  [69-100] 74 (11/21 1351) Resp:  [9-15] 10 (11/21 1351) BP: (110-127)/(73-93) 124/87 (11/21 1351) SpO2:  [95 %-98 %] 95 % (11/21 1351) Weight change:  Last BM Date: 05/04/19  Intake/Output from previous day: 11/20 0701 - 11/21 0700 In: 598 [P.O.:440; I.V.:10; IV Piggyback:148] Out: 400 [Urine:400] Intake/Output this shift: Total I/O In: 7 [IV Piggyback:7] Out: 950 [Urine:950]  General appearance: alert, cooperative, appears stated age and no distress Head: Normocephalic, without obvious abnormality, atraumatic Back: symmetric, no curvature. ROM normal. No CVA tenderness. Resp: clear to auscultation bilaterally Cardio: regular rate and rhythm, S1, S2 normal, no murmur, click, rub or gallop GI: soft, non-tender; bowel sounds normal; no masses,  no organomegaly Extremities: extremities normal, atraumatic, no cyanosis or edema Pulses: 2+ and symmetric Skin: Skin color, texture, turgor normal. No rashes or lesions Neurologic: Grossly normal  Lab Results: Recent Labs    05/07/19 1233 05/08/19 0514  WBC 15.4* 13.3*  HGB 8.2* 7.5*  HCT 24.4* 23.1*  PLT 561* 538*   BMET No results for input(s): NA, K, CL, CO2, GLUCOSE, BUN, CREATININE, CALCIUM in the last 72 hours.  Studies/Results: No results found.  Medications: I have reviewed the patient's current  medications.  Assessment/Plan: A 38 year old female admitted with sickle cell painful crisis  #1 sickle cell disease with crisis: Patient will be maintained on Dilaudid PCA at the current regimen.  She appears to be improving but very slowly.  Continue Dilaudid PCA with Toradol and IV fluids.  #2 sickle cell anemia: Hemoglobin has dropped from 8.2-7.5.  Continue to monitor H&H  #3 leukocytosis: White count has improved to 13.3.  Continue monitoring  #4 history of DVT and PE: Currently on Xarelto.  Continue  #5 thrombocythemia: Due to vaso-occlusive crisis  #6 chronic pain syndrome: Continue home regimen.    LOS: 3 days   GARBA,LAWAL 05/08/2019, 4:15 PM

## 2019-05-09 LAB — CBC WITH DIFFERENTIAL/PLATELET
Abs Immature Granulocytes: 0.09 10*3/uL — ABNORMAL HIGH (ref 0.00–0.07)
Basophils Absolute: 0.1 10*3/uL (ref 0.0–0.1)
Basophils Relative: 0 %
Eosinophils Absolute: 0.6 10*3/uL — ABNORMAL HIGH (ref 0.0–0.5)
Eosinophils Relative: 4 %
HCT: 24.6 % — ABNORMAL LOW (ref 36.0–46.0)
Hemoglobin: 7.8 g/dL — ABNORMAL LOW (ref 12.0–15.0)
Immature Granulocytes: 1 %
Lymphocytes Relative: 26 %
Lymphs Abs: 3.9 10*3/uL (ref 0.7–4.0)
MCH: 29.4 pg (ref 26.0–34.0)
MCHC: 31.7 g/dL (ref 30.0–36.0)
MCV: 92.8 fL (ref 80.0–100.0)
Monocytes Absolute: 1.9 10*3/uL — ABNORMAL HIGH (ref 0.1–1.0)
Monocytes Relative: 13 %
Neutro Abs: 8.3 10*3/uL — ABNORMAL HIGH (ref 1.7–7.7)
Neutrophils Relative %: 56 %
Platelets: 590 10*3/uL — ABNORMAL HIGH (ref 150–400)
RBC: 2.65 MIL/uL — ABNORMAL LOW (ref 3.87–5.11)
RDW: 20.3 % — ABNORMAL HIGH (ref 11.5–15.5)
WBC: 14.8 10*3/uL — ABNORMAL HIGH (ref 4.0–10.5)
nRBC: 0.2 % (ref 0.0–0.2)

## 2019-05-09 LAB — COMPREHENSIVE METABOLIC PANEL
ALT: 17 U/L (ref 0–44)
AST: 23 U/L (ref 15–41)
Albumin: 3.9 g/dL (ref 3.5–5.0)
Alkaline Phosphatase: 56 U/L (ref 38–126)
Anion gap: 6 (ref 5–15)
BUN: 9 mg/dL (ref 6–20)
CO2: 24 mmol/L (ref 22–32)
Calcium: 8.5 mg/dL — ABNORMAL LOW (ref 8.9–10.3)
Chloride: 108 mmol/L (ref 98–111)
Creatinine, Ser: 0.53 mg/dL (ref 0.44–1.00)
GFR calc Af Amer: >60 mL/min (ref >60)
GFR calc non Af Amer: >60 mL/min (ref >60)
Glucose, Bld: 95 mg/dL (ref 70–99)
Potassium: 3.7 mmol/L (ref 3.5–5.1)
Sodium: 138 mmol/L (ref 135–145)
Total Bilirubin: 2.6 mg/dL — ABNORMAL HIGH (ref 0.3–1.2)
Total Protein: 6.9 g/dL (ref 6.5–8.1)

## 2019-05-09 NOTE — Progress Notes (Signed)
Subjective: Patient continues to complain of 7 out of 10 pain today.  Occasionally 6 out of 10.  Still in her rib cage and lower back.  She has been able to go to the bathroom and back.  Her goal for the pain is 4 out of 10.  No fever or chills no nausea vomiting or diarrhea.  She has use 80 mg of the Dilaudid in the last 24 hours..  Objective: Vital signs in last 24 hours: Temp:  [98.8 F (37.1 C)-99.5 F (37.5 C)] 99.2 F (37.3 C) (11/22 2158) Pulse Rate:  [65-98] 82 (11/22 2158) Resp:  [10-17] 10 (11/22 2158) BP: (119-143)/(81-93) 143/93 (11/22 2158) SpO2:  [95 %-99 %] 96 % (11/22 2158) Weight change:  Last BM Date: 05/04/19  Intake/Output from previous day: 11/21 0701 - 11/22 0700 In: 890 [P.O.:730; I.V.:60; IV Piggyback:100] Out: 951 [Urine:951] Intake/Output this shift: No intake/output data recorded.  General appearance: alert, cooperative, appears stated age and no distress Head: Normocephalic, without obvious abnormality, atraumatic Back: symmetric, no curvature. ROM normal. No CVA tenderness. Resp: clear to auscultation bilaterally Cardio: regular rate and rhythm, S1, S2 normal, no murmur, click, rub or gallop GI: soft, non-tender; bowel sounds normal; no masses,  no organomegaly Extremities: extremities normal, atraumatic, no cyanosis or edema Pulses: 2+ and symmetric Skin: Skin color, texture, turgor normal. No rashes or lesions Neurologic: Grossly normal  Lab Results: Recent Labs    05/08/19 0514 05/09/19 0912  WBC 13.3* 14.8*  HGB 7.5* 7.8*  HCT 23.1* 24.6*  PLT 538* 590*   BMET Recent Labs    05/09/19 0912  NA 138  K 3.7  CL 108  CO2 24  GLUCOSE 95  BUN 9  CREATININE 0.53  CALCIUM 8.5*    Studies/Results: No results found.  Medications: I have reviewed the patient's current medications.  Assessment/Plan: A 38 year old female admitted with sickle cell painful crisis  #1 sickle cell disease with crisis: Patient will be maintained on  Dilaudid PCA at the current regimen.  She appears to be improving but very slowly.  Continue Dilaudid PCA with Toradol and decrease IV fluids.  Continue oral medications for breakthrough pain  #2 sickle cell anemia: Hemoglobin has dropped from 8.2-7.5.  Continue to monitor H&H  #3 leukocytosis: White count has improved to 13.3.  Continue monitoring  #4 history of DVT and PE: Currently on Xarelto.  Continue  #5 thrombocythemia: Due to vaso-occlusive crisis  #6 chronic pain syndrome: Continue home regimen.    LOS: 4 days   Yang Rack,LAWAL 05/09/2019, 11:15 PM

## 2019-05-10 LAB — CBC WITH DIFFERENTIAL/PLATELET
Abs Immature Granulocytes: 0.06 10*3/uL (ref 0.00–0.07)
Basophils Absolute: 0.1 10*3/uL (ref 0.0–0.1)
Basophils Relative: 0 %
Eosinophils Absolute: 0.5 10*3/uL (ref 0.0–0.5)
Eosinophils Relative: 4 %
HCT: 23.2 % — ABNORMAL LOW (ref 36.0–46.0)
Hemoglobin: 7.3 g/dL — ABNORMAL LOW (ref 12.0–15.0)
Immature Granulocytes: 1 %
Lymphocytes Relative: 26 %
Lymphs Abs: 3 10*3/uL (ref 0.7–4.0)
MCH: 28.7 pg (ref 26.0–34.0)
MCHC: 31.5 g/dL (ref 30.0–36.0)
MCV: 91.3 fL (ref 80.0–100.0)
Monocytes Absolute: 1.6 10*3/uL — ABNORMAL HIGH (ref 0.1–1.0)
Monocytes Relative: 13 %
Neutro Abs: 6.5 10*3/uL (ref 1.7–7.7)
Neutrophils Relative %: 56 %
Platelets: 539 10*3/uL — ABNORMAL HIGH (ref 150–400)
RBC: 2.54 MIL/uL — ABNORMAL LOW (ref 3.87–5.11)
RDW: 19.3 % — ABNORMAL HIGH (ref 11.5–15.5)
WBC: 11.7 10*3/uL — ABNORMAL HIGH (ref 4.0–10.5)
nRBC: 0.3 % — ABNORMAL HIGH (ref 0.0–0.2)

## 2019-05-10 LAB — COMPREHENSIVE METABOLIC PANEL
ALT: 14 U/L (ref 0–44)
AST: 22 U/L (ref 15–41)
Albumin: 3.4 g/dL — ABNORMAL LOW (ref 3.5–5.0)
Alkaline Phosphatase: 51 U/L (ref 38–126)
Anion gap: 5 (ref 5–15)
BUN: 5 mg/dL — ABNORMAL LOW (ref 6–20)
CO2: 26 mmol/L (ref 22–32)
Calcium: 8.3 mg/dL — ABNORMAL LOW (ref 8.9–10.3)
Chloride: 110 mmol/L (ref 98–111)
Creatinine, Ser: 0.45 mg/dL (ref 0.44–1.00)
GFR calc Af Amer: >60 mL/min (ref >60)
GFR calc non Af Amer: >60 mL/min (ref >60)
Glucose, Bld: 94 mg/dL (ref 70–99)
Potassium: 3.4 mmol/L — ABNORMAL LOW (ref 3.5–5.1)
Sodium: 141 mmol/L (ref 135–145)
Total Bilirubin: 2.1 mg/dL — ABNORMAL HIGH (ref 0.3–1.2)
Total Protein: 6.4 g/dL — ABNORMAL LOW (ref 6.5–8.1)

## 2019-05-10 NOTE — Progress Notes (Signed)
Patient ID: Molly Gutierrez, female   DOB: 03-21-1981, 38 y.o.   MRN: [id number] Subjective: Molly Gutierrez, a 38 year old female with a medical history significant for sickle cell disease, chronic pain syndrome, opiate dependence and tolerance, history of PE on Xarelto, and history of mild intermittent asthma was admitted for sickle cell pain crisis.  Patient's general condition remain the same, she claims her pain is still at 7/10, localized to her mid chest area.  She has no new symptoms today.  She got 1 unit of packed red blood cell transfused on 05/06/2019, she denies any fever, blurry vision, headache, chest pain, shortness of breath, nausea, vomiting or diarrhea.  Objective:  Vital signs in last 24 hours:  Vitals:   05/10/19 0812 05/10/19 0950 05/10/19 1353 05/10/19 1355  BP:  (!) 132/91 126/80   Pulse:  79 75   Resp: 10 14 14 13   Temp:  98.8 F (37.1 C) 98.8 F (37.1 C)   TempSrc:  Oral Oral   SpO2: 95% 100% 97% 94%  Weight:      Height:       Intake/Output from previous day:   Intake/Output Summary (Last 24 hours) at 05/10/2019 1739 Last data filed at 05/10/2019 1000 Gross per 24 hour  Intake 50 ml  Output 600 ml  Net -550 ml   Physical Exam: General: Alert, awake, oriented x3, in no acute distress.  HEENT: [place]/AT [place], EOMI Neck: Trachea midline,  no masses, no thyromegal,y no JVD, no carotid bruit OROPHARYNX:  Moist, No exudate/ erythema/lesions.  Heart: Regular rate and rhythm, without murmurs, rubs, gallops, PMI non-displaced, no heaves or thrills on palpation.  Lungs: Clear to auscultation, no wheezing or rhonchi noted. No increased vocal fremitus resonant to percussion  Abdomen: Soft, nontender, nondistended, positive bowel sounds, no masses no hepatosplenomegaly noted..  Neuro: No focal neurological deficits noted cranial nerves II through XII grossly intact. DTRs 2+ bilaterally upper and lower extremities. Strength 5 out of 5 in bilateral upper and  lower extremities. Musculoskeletal: No warm swelling or erythema around joints, no spinal tenderness noted. Psychiatric: Patient alert and oriented x3, good insight and cognition, good recent to remote recall. Lymph node survey: No cervical axillary or inguinal lymphadenopathy noted.  Lab Results:  Basic Metabolic Panel:    Component Value Date/Time   NA 141 05/10/2019 0331   K 3.4 (L) 05/10/2019 0331   CL 110 05/10/2019 0331   CO2 26 05/10/2019 0331   BUN 5 (L) 05/10/2019 0331   CREATININE 0.45 05/10/2019 0331   GLUCOSE 94 05/10/2019 0331   CALCIUM 8.3 (L) 05/10/2019 0331   CBC:    Component Value Date/Time   WBC 11.7 (H) 05/10/2019 0331   HGB 7.3 (L) 05/10/2019 0331   HCT 23.2 (L) 05/10/2019 0331   PLT 539 (H) 05/10/2019 0331   MCV 91.3 05/10/2019 0331   NEUTROABS 6.5 05/10/2019 0331   LYMPHSABS 3.0 05/10/2019 0331   MONOABS 1.6 (H) 05/10/2019 0331   EOSABS 0.5 05/10/2019 0331   BASOSABS 0.1 05/10/2019 0331    No results found for this or any previous visit (from the past 240 hour(s)).  Studies/Results: No results found.  Medications: Scheduled Meds: . Chlorhexidine Gluconate Cloth  6 each Topical Daily  . cholecalciferol  1,000 Units Oral Daily  . Deferasirox  1,080 mg Oral Daily  . folic acid  1 mg Oral Daily  . HYDROmorphone   Intravenous Q4H  . mirtazapine  45 mg Oral QHS  . pantoprazole  40 mg  Oral BID  . rivaroxaban  20 mg Oral Daily  . senna-docusate  1 tablet Oral BID  . vitamin B-12  1,000 mcg Oral Daily  . voxelotor  1,500 mg Oral Daily   Continuous Infusions: . diphenhydrAMINE 25 mg (05/10/19 1354)   PRN Meds:.diphenhydrAMINE **OR** diphenhydrAMINE, mometasone-formoterol, naloxone **AND** sodium chloride flush, ondansetron (ZOFRAN) IV, oxyCODONE, polyethylene glycol, sodium chloride flush  Assessment/Plan: Principal Problem:   Sickle cell pain crisis ([place]) Active Problems:   Leukocytosis   History of pulmonary embolism   Hypokalemia    Thrombocytosis ([place])  1. Hb Sickle Cell Disease with crisis: Continue IVF at [facility], continue to wean weight based Dilaudid PCA, she has completed 5 days dose of IV Toradol, Monitor vitals very closely, Re-evaluate pain scale regularly, 2 L of Oxygen by [place]. 2. Leukocytosis: Stable. Patient is afebrile.  No signs of infection or inflammation.  We will continue to monitor. 3. Sickle Cell Anemia: Hemoglobin has returned to baseline status post transfusion of 1 unit.  Continue to follow closely 4. Chronic pain Syndrome: Continue home pain medications. 5. History of DVT/PE: Continue Xarelto.  Code Status: Full Code Family Communication: N/A Disposition Plan: Not yet ready for discharge  Yomaira Solar  If 7PM-7AM, please contact night-coverage.  05/10/2019, 5:39 PM  LOS: 5 days

## 2019-05-10 NOTE — Care Management Important Message (Signed)
Important Message  Patient Details IM Letter given to Sharren Bridge SW to present to the Patient Name: Molly Gutierrez MRN: [id number] Date of Birth: May 26, 1981   Medicare Important Message Given:  Yes     Kerin Salen 05/10/2019, 10:24 AM

## 2019-05-11 LAB — CBC WITH DIFFERENTIAL/PLATELET
Abs Immature Granulocytes: 0.09 10*3/uL — ABNORMAL HIGH (ref 0.00–0.07)
Basophils Absolute: 0.1 10*3/uL (ref 0.0–0.1)
Basophils Relative: 1 %
Eosinophils Absolute: 0.6 10*3/uL — ABNORMAL HIGH (ref 0.0–0.5)
Eosinophils Relative: 4 %
HCT: 23.2 % — ABNORMAL LOW (ref 36.0–46.0)
Hemoglobin: 7.5 g/dL — ABNORMAL LOW (ref 12.0–15.0)
Immature Granulocytes: 1 %
Lymphocytes Relative: 21 %
Lymphs Abs: 3.3 10*3/uL (ref 0.7–4.0)
MCH: 29.4 pg (ref 26.0–34.0)
MCHC: 32.3 g/dL (ref 30.0–36.0)
MCV: 91 fL (ref 80.0–100.0)
Monocytes Absolute: 1.9 10*3/uL — ABNORMAL HIGH (ref 0.1–1.0)
Monocytes Relative: 12 %
Neutro Abs: 9.9 10*3/uL — ABNORMAL HIGH (ref 1.7–7.7)
Neutrophils Relative %: 61 %
Platelets: 572 10*3/uL — ABNORMAL HIGH (ref 150–400)
RBC: 2.55 MIL/uL — ABNORMAL LOW (ref 3.87–5.11)
RDW: 18.8 % — ABNORMAL HIGH (ref 11.5–15.5)
WBC: 15.8 10*3/uL — ABNORMAL HIGH (ref 4.0–10.5)
nRBC: 0.3 % — ABNORMAL HIGH (ref 0.0–0.2)

## 2019-05-11 NOTE — Progress Notes (Signed)
MD paged regarding BP 131/110, awaiting new orders.

## 2019-05-11 NOTE — Progress Notes (Signed)
MD paged again

## 2019-05-11 NOTE — Progress Notes (Signed)
Patient ID: Molly Gutierrez, female   DOB: Jul 03, 1980, 38 y.o.   MRN: [id number] Subjective: Molly Gutierrez, a 38 year old female with a medical history significant for sickle cell disease, chronic pain syndrome, opiate dependence and tolerance, history of PE on Xarelto, and history of mild intermittent asthma was admitted for sickle cell pain crisis.  Patient states she feels much better today but still in significant pain that cannot be managed at home.  She continues to rate her pain at 7/10 localized to her mid chest area.  There is no new pain area.  She denies any fever.  She denies any headache, chest pain, shortness of breath, nausea, vomiting or diarrhea.  Objective:  Vital signs in last 24 hours:  Vitals:   05/11/19 1009 05/11/19 1337 05/11/19 1707 05/11/19 1757  BP: 126/90 126/89  118/82  Pulse: 88 85  70  Resp: 12 16 12 14   Temp: 98.7 F (37.1 C) 99.2 F (37.3 C)  98.6 F (37 C)  TempSrc: Oral Oral  Oral  SpO2: 98% 99% 96% 96%  Weight:      Height:       Intake/Output from previous day:   Intake/Output Summary (Last 24 hours) at 05/11/2019 1833 Last data filed at 05/11/2019 1800 Gross per 24 hour  Intake 1200 ml  Output 2200 ml  Net -1000 ml   Physical Exam: General: Alert, awake, oriented x3, in no acute distress.  HEENT: [place]/AT [place], EOMI Neck: Trachea midline,  no masses, no thyromegal,y no JVD, no carotid bruit OROPHARYNX:  Moist, No exudate/ erythema/lesions.  Heart: Regular rate and rhythm, without murmurs, rubs, gallops, PMI non-displaced, no heaves or thrills on palpation.  Lungs: Clear to auscultation, no wheezing or rhonchi noted. No increased vocal fremitus resonant to percussion  Abdomen: Soft, nontender, nondistended, positive bowel sounds, no masses no hepatosplenomegaly noted..  Neuro: No focal neurological deficits noted cranial nerves II through XII grossly intact. DTRs 2+ bilaterally upper and lower extremities. Strength 5 out of 5 in  bilateral upper and lower extremities. Musculoskeletal: No warm swelling or erythema around joints, no spinal tenderness noted. Psychiatric: Patient alert and oriented x3, good insight and cognition, good recent to remote recall. Lymph node survey: No cervical axillary or inguinal lymphadenopathy noted.  Lab Results:  Basic Metabolic Panel:    Component Value Date/Time   NA 141 05/10/2019 0331   K 3.4 (L) 05/10/2019 0331   CL 110 05/10/2019 0331   CO2 26 05/10/2019 0331   BUN 5 (L) 05/10/2019 0331   CREATININE 0.45 05/10/2019 0331   GLUCOSE 94 05/10/2019 0331   CALCIUM 8.3 (L) 05/10/2019 0331   CBC:    Component Value Date/Time   WBC 15.8 (H) 05/11/2019 0546   HGB 7.5 (L) 05/11/2019 0546   HCT 23.2 (L) 05/11/2019 0546   PLT 572 (H) 05/11/2019 0546   MCV 91.0 05/11/2019 0546   NEUTROABS 9.9 (H) 05/11/2019 0546   LYMPHSABS 3.3 05/11/2019 0546   MONOABS 1.9 (H) 05/11/2019 0546   EOSABS 0.6 (H) 05/11/2019 0546   BASOSABS 0.1 05/11/2019 0546    No results found for this or any previous visit (from the past 240 hour(s)).  Studies/Results: No results found.  Medications: Scheduled Meds: . Chlorhexidine Gluconate Cloth  6 each Topical Daily  . cholecalciferol  1,000 Units Oral Daily  . Deferasirox  1,080 mg Oral Daily  . folic acid  1 mg Oral Daily  . HYDROmorphone   Intravenous Q4H  . mirtazapine  45 mg Oral  QHS  . pantoprazole  40 mg Oral BID  . rivaroxaban  20 mg Oral Daily  . senna-docusate  1 tablet Oral BID  . vitamin B-12  1,000 mcg Oral Daily  . voxelotor  1,500 mg Oral Daily   Continuous Infusions: . diphenhydrAMINE 25 mg (05/11/19 1028)   PRN Meds:.diphenhydrAMINE **OR** diphenhydrAMINE, mometasone-formoterol, naloxone **AND** sodium chloride flush, ondansetron (ZOFRAN) IV, oxyCODONE, polyethylene glycol, sodium chloride flush  Assessment/Plan: Principal Problem:   Sickle cell pain crisis ([place]) Active Problems:   Leukocytosis   History of pulmonary  embolism   Hypokalemia   Thrombocytosis ([place])  1. Hb Sickle Cell Disease with crisis: Continue IVF at [facility], continue to wean weight based Dilaudid PCA, she has completed 5 days dose of IV Toradol, restart oral pain medication, monitor vitals very closely, Re-evaluate pain scale regularly, 2 L of Oxygen by [place]. 2. Leukocytosis: Stable. Patient is afebrile.  No signs of infection or inflammation.  We will continue to monitor. 3. Sickle Cell Anemia: Hemoglobin has returned to baseline status post transfusion of 1 unit.  Patient is hemodynamically stable. Continue to follow closely. 4. Chronic pain Syndrome: Continue home pain medications. 5. History of DVT/PE: Continue Xarelto.  Code Status: Full Code Family Communication: N/A Disposition Plan: Not yet ready for discharge  Wyley Hack  If 7PM-7AM, please contact night-coverage.  05/11/2019, 6:33 PM  LOS: 6 days

## 2019-05-11 NOTE — Progress Notes (Signed)
Received order to recheck BP manually. 128/76.

## 2019-05-12 MED ORDER — HYDROMORPHONE 1 MG/ML IV SOLN
INTRAVENOUS | Status: DC
  Administered 2019-05-12: 2 mg via INTRAVENOUS
  Administered 2019-05-12: 3.3 mg via INTRAVENOUS
  Administered 2019-05-12: 0.9 mg via INTRAVENOUS
  Administered 2019-05-13: 30 mg via INTRAVENOUS
  Administered 2019-05-13: 2.1 mg via INTRAVENOUS
  Administered 2019-05-13: 2.4 mg via INTRAVENOUS
  Administered 2019-05-13: 4.2 mg via INTRAVENOUS
  Administered 2019-05-13 (×2): 3 mg via INTRAVENOUS
  Administered 2019-05-13: 2.7 mg via INTRAVENOUS
  Administered 2019-05-14: 0.3 mg via INTRAVENOUS
  Administered 2019-05-14: 4.2 mg via INTRAVENOUS
  Administered 2019-05-14: 3.9 mg via INTRAVENOUS
  Administered 2019-05-14: 3 mg via INTRAVENOUS
  Administered 2019-05-14: 2.7 mg via INTRAVENOUS
  Administered 2019-05-14: 5.1 mg via INTRAVENOUS
  Administered 2019-05-14 – 2019-05-15 (×2): 0.6 mg via INTRAVENOUS
  Administered 2019-05-15: 2.4 mg via INTRAVENOUS
  Administered 2019-05-15: 1.8 mg via INTRAVENOUS
  Filled 2019-05-12: qty 30

## 2019-05-12 NOTE — Progress Notes (Signed)
Patient ID: Molly Gutierrez, female   DOB: 17-Jun-1981, 38 y.o.   MRN: [id number] Subjective: Molly Gutierrez, a 38 year old female with a medical history significant for sickle cell disease, chronic pain syndrome, opiate dependence and tolerance, history of PE on Xarelto, and history of mild intermittent asthma was admitted for sickle cell pain crisis.  Patient's general condition remain the same, a little better but still in significant pain.  She said her pain is at 6/10.  She does not feel like going home today.  There is no new symptoms.  She is eating and drinking well.  She denies any fever, cough, chest pain, shortness of breath, nausea, vomiting or diarrhea.  Objective:  Vital signs in last 24 hours:  Vitals:   05/12/19 1237 05/12/19 1306 05/12/19 1549 05/12/19 1659  BP:  119/81  107/81  Pulse:  81  79  Resp: 10 12 12 12   Temp:  99.1 F (37.3 C)  99.2 F (37.3 C)  TempSrc:  Oral  Oral  SpO2: 98% 98% 95% 98%  Weight:      Height:       Intake/Output from previous day:   Intake/Output Summary (Last 24 hours) at 05/12/2019 1849 Last data filed at 05/12/2019 1746 Gross per 24 hour  Intake 660 ml  Output 1700 ml  Net -1040 ml   Physical Exam: General: Alert, awake, oriented x3, in no acute distress.  HEENT: [place]/AT [place], EOMI Neck: Trachea midline,  no masses, no thyromegal,y no JVD, no carotid bruit OROPHARYNX:  Moist, No exudate/ erythema/lesions.  Heart: Regular rate and rhythm, without murmurs, rubs, gallops, PMI non-displaced, no heaves or thrills on palpation.  Lungs: Clear to auscultation, no wheezing or rhonchi noted. No increased vocal fremitus resonant to percussion  Abdomen: Soft, nontender, nondistended, positive bowel sounds, no masses no hepatosplenomegaly noted..  Neuro: No focal neurological deficits noted cranial nerves II through XII grossly intact. DTRs 2+ bilaterally upper and lower extremities. Strength 5 out of 5 in bilateral upper and lower  extremities. Musculoskeletal: No warm swelling or erythema around joints, no spinal tenderness noted. Psychiatric: Patient alert and oriented x3, good insight and cognition, good recent to remote recall. Lymph node survey: No cervical axillary or inguinal lymphadenopathy noted.  Lab Results:  Basic Metabolic Panel:    Component Value Date/Time   NA 141 05/10/2019 0331   K 3.4 (L) 05/10/2019 0331   CL 110 05/10/2019 0331   CO2 26 05/10/2019 0331   BUN 5 (L) 05/10/2019 0331   CREATININE 0.45 05/10/2019 0331   GLUCOSE 94 05/10/2019 0331   CALCIUM 8.3 (L) 05/10/2019 0331   CBC:    Component Value Date/Time   WBC 15.8 (H) 05/11/2019 0546   HGB 7.5 (L) 05/11/2019 0546   HCT 23.2 (L) 05/11/2019 0546   PLT 572 (H) 05/11/2019 0546   MCV 91.0 05/11/2019 0546   NEUTROABS 9.9 (H) 05/11/2019 0546   LYMPHSABS 3.3 05/11/2019 0546   MONOABS 1.9 (H) 05/11/2019 0546   EOSABS 0.6 (H) 05/11/2019 0546   BASOSABS 0.1 05/11/2019 0546    No results found for this or any previous visit (from the past 240 hour(s)).  Studies/Results: No results found.  Medications: Scheduled Meds: . Chlorhexidine Gluconate Cloth  6 each Topical Daily  . cholecalciferol  1,000 Units Oral Daily  . Deferasirox  1,080 mg Oral Daily  . folic acid  1 mg Oral Daily  . HYDROmorphone   Intravenous Q4H  . mirtazapine  45 mg Oral QHS  .  pantoprazole  40 mg Oral BID  . rivaroxaban  20 mg Oral Daily  . senna-docusate  1 tablet Oral BID  . vitamin B-12  1,000 mcg Oral Daily  . voxelotor  1,500 mg Oral Daily   Continuous Infusions: . diphenhydrAMINE 25 mg (05/12/19 0227)   PRN Meds:.diphenhydrAMINE **OR** diphenhydrAMINE, mometasone-formoterol, naloxone **AND** sodium chloride flush, ondansetron (ZOFRAN) IV, oxyCODONE, polyethylene glycol, sodium chloride flush  Assessment/Plan: Principal Problem:   Sickle cell pain crisis ([place]) Active Problems:   Leukocytosis   History of pulmonary embolism   Hypokalemia    Thrombocytosis ([place])  1. Hb Sickle Cell Disease with crisis: [facility], continue to wean weight based Dilaudid PCA, she has completed 5 days dose of IV Toradol, continue oral pain medication, monitor vitals very closely, Re-evaluate pain scale regularly, 2 L of Oxygen by [place]. 2. Leukocytosis: Stable. Patient is afebrile. No signs of infection or inflammation.  We will continue to monitor. 3. Sickle Cell Anemia: Hemoglobin is stable at baseline. Patient is hemodynamically stable. Continue to follow closely. 4. Chronic pain Syndrome: Continue home pain medications. 5. History of DVT/PE: Continue Xarelto.  Code Status: Full Code Family Communication: N/A Disposition Plan: Not yet ready for discharge  Cylan Borum  If 7PM-7AM, please contact night-coverage.  05/12/2019, 6:49 PM  LOS: 7 days

## 2019-05-12 NOTE — Care Management Important Message (Signed)
Important Message  Patient Details IM Letter given to Sharren Bridge SW to present to the Patient Name: Molly Gutierrez MRN: [id number] Date of Birth: 1981-01-25   Medicare Important Message Given:  Yes     Kerin Salen 05/12/2019, 11:00 AM

## 2019-05-13 LAB — COMPREHENSIVE METABOLIC PANEL
ALT: 15 U/L (ref 0–44)
AST: 23 U/L (ref 15–41)
Albumin: 3.9 g/dL (ref 3.5–5.0)
Alkaline Phosphatase: 55 U/L (ref 38–126)
Anion gap: 7 (ref 5–15)
BUN: 6 mg/dL (ref 6–20)
CO2: 25 mmol/L (ref 22–32)
Calcium: 8.4 mg/dL — ABNORMAL LOW (ref 8.9–10.3)
Chloride: 104 mmol/L (ref 98–111)
Creatinine, Ser: 0.37 mg/dL — ABNORMAL LOW (ref 0.44–1.00)
GFR calc Af Amer: >60 mL/min (ref >60)
GFR calc non Af Amer: >60 mL/min (ref >60)
Glucose, Bld: 117 mg/dL — ABNORMAL HIGH (ref 70–99)
Potassium: 3.1 mmol/L — ABNORMAL LOW (ref 3.5–5.1)
Sodium: 136 mmol/L (ref 135–145)
Total Bilirubin: 2.6 mg/dL — ABNORMAL HIGH (ref 0.3–1.2)
Total Protein: 7.1 g/dL (ref 6.5–8.1)

## 2019-05-13 LAB — CBC WITH DIFFERENTIAL/PLATELET
Abs Immature Granulocytes: 0.06 10*3/uL (ref 0.00–0.07)
Basophils Absolute: 0.1 10*3/uL (ref 0.0–0.1)
Basophils Relative: 1 %
Eosinophils Absolute: 0.4 10*3/uL (ref 0.0–0.5)
Eosinophils Relative: 3 %
HCT: 25 % — ABNORMAL LOW (ref 36.0–46.0)
Hemoglobin: 8 g/dL — ABNORMAL LOW (ref 12.0–15.0)
Immature Granulocytes: 1 %
Lymphocytes Relative: 24 %
Lymphs Abs: 3 10*3/uL (ref 0.7–4.0)
MCH: 29.3 pg (ref 26.0–34.0)
MCHC: 32 g/dL (ref 30.0–36.0)
MCV: 91.6 fL (ref 80.0–100.0)
Monocytes Absolute: 1.8 10*3/uL — ABNORMAL HIGH (ref 0.1–1.0)
Monocytes Relative: 14 %
Neutro Abs: 7.4 10*3/uL (ref 1.7–7.7)
Neutrophils Relative %: 57 %
Platelets: 528 10*3/uL — ABNORMAL HIGH (ref 150–400)
RBC: 2.73 MIL/uL — ABNORMAL LOW (ref 3.87–5.11)
RDW: 18.6 % — ABNORMAL HIGH (ref 11.5–15.5)
WBC: 12.7 10*3/uL — ABNORMAL HIGH (ref 4.0–10.5)
nRBC: 0.3 % — ABNORMAL HIGH (ref 0.0–0.2)

## 2019-05-13 MED ORDER — POTASSIUM CHLORIDE CRYS ER 20 MEQ PO TBCR
40.0000 meq | EXTENDED_RELEASE_TABLET | Freq: Once | ORAL | Status: AC
  Administered 2019-05-13: 40 meq via ORAL
  Filled 2019-05-13: qty 2

## 2019-05-13 NOTE — Progress Notes (Signed)
Patient ID: Molly Gutierrez, female   DOB: April 05, 1981, 38 y.o.   MRN: [id number] Subjective: Molly Gutierrez, a 38 year old female with a medical history significant for sickle cell disease, chronic pain syndrome, opiate dependence and tolerance, history of PE on Xarelto, and history of mild intermittent asthma was admitted for sickle cell pain crisis.  Patient says her pain is not at baseline and for this reason she is uncomfortable going home.  She rates her pain at 6/10 mostly in her lower extremities and lower back.  No redness or swelling of any joint.  Patient denies any fever, cough, chest pain, shortness of breath, nausea, vomiting or diarrhea.  Patient is ambulating well on the hallway.  She is tolerating p.o. intake adequately.  Objective:  Vital signs in last 24 hours:  Vitals:   05/13/19 0851 05/13/19 0934 05/13/19 1248 05/13/19 1308  BP:  108/73  106/76  Pulse:  95  86  Resp: 14 18 14 18   Temp:  98.2 F (36.8 C)  99.4 F (37.4 C)  TempSrc:  Oral  Oral  SpO2: 95% 94% 97% 99%  Weight:      Height:       Intake/Output from previous day:   Intake/Output Summary (Last 24 hours) at 05/13/2019 1311 Last data filed at 05/13/2019 [id number] Gross per 24 hour  Intake 1011.5 ml  Output 2200 ml  Net -1188.5 ml   Physical Exam: General: Alert, awake, oriented x3, in no acute distress.  HEENT: [place]/AT [place]ER[place], EOMI Neck: Trachea midline,  no masses, no thyromegal,y no JVD, no carotid bruit OROPHARYNX:  Moist, No exudate/ erythema/lesions.  Heart: Regular rate and rhythm, without murmurs, rubs, gallops, PMI non-displaced, no heaves or thrills on palpation.  Lungs: Clear to auscultation, no wheezing or rhonchi noted. No increased vocal fremitus resonant to percussion  Abdomen: Soft, nontender, nondistended, positive bowel sounds, no masses no hepatosplenomegaly noted..  Neuro: No focal neurological deficits noted cranial nerves II through XII grossly intact. DTRs 2+ bilaterally  upper and lower extremities. Strength 5 out of 5 in bilateral upper and lower extremities. Musculoskeletal: No warm swelling or erythema around joints, no spinal tenderness noted. Psychiatric: Patient alert and oriented x3, good insight and cognition, good recent to remote recall. Lymph node survey: No cervical axillary or inguinal lymphadenopathy noted.  Lab Results:  Basic Metabolic Panel:    Component Value Date/Time   NA 136 05/13/2019 0339   K 3.1 (L) 05/13/2019 0339   CL 104 05/13/2019 0339   CO2 25 05/13/2019 0339   BUN 6 05/13/2019 0339   CREATININE 0.37 (L) 05/13/2019 0339   GLUCOSE 117 (H) 05/13/2019 0339   CALCIUM 8.4 (L) 05/13/2019 0339   CBC:    Component Value Date/Time   WBC 12.7 (H) 05/13/2019 0339   HGB 8.0 (L) 05/13/2019 0339   HCT 25.0 (L) 05/13/2019 0339   PLT 528 (H) 05/13/2019 0339   MCV 91.6 05/13/2019 0339   NEUTROABS 7.4 05/13/2019 0339   LYMPHSABS 3.0 05/13/2019 0339   MONOABS 1.8 (H) 05/13/2019 0339   EOSABS 0.4 05/13/2019 0339   BASOSABS 0.1 05/13/2019 0339    No results found for this or any previous visit (from the past 240 hour(s)).  Studies/Results: No results found.  Medications: Scheduled Meds: . Chlorhexidine Gluconate Cloth  6 each Topical Daily  . cholecalciferol  1,000 Units Oral Daily  . Deferasirox  1,080 mg Oral Daily  . folic acid  1 mg Oral Daily  . HYDROmorphone   Intravenous Q4H  .  mirtazapine  45 mg Oral QHS  . pantoprazole  40 mg Oral BID  . rivaroxaban  20 mg Oral Daily  . senna-docusate  1 tablet Oral BID  . vitamin B-12  1,000 mcg Oral Daily  . voxelotor  1,500 mg Oral Daily   Continuous Infusions: . diphenhydrAMINE 25 mg (05/13/19 1245)   PRN Meds:.diphenhydrAMINE **OR** diphenhydrAMINE, mometasone-formoterol, naloxone **AND** sodium chloride flush, ondansetron (ZOFRAN) IV, oxyCODONE, polyethylene glycol, sodium chloride flush  Assessment/Plan: Principal Problem:   Sickle cell pain crisis ([place]) Active  Problems:   Leukocytosis   History of pulmonary embolism   Hypokalemia   Thrombocytosis ([place])  1. Hb Sickle Cell Disease with crisis: Plan is to discharge patient home tomorrow morning. Continue IVF at [facility], continue weight based Dilaudid PCA at current setting, she has completed 5 days dose of IV Toradol, continue oral pain medication, monitor vitals very closely, Re-evaluate pain scale regularly, 2 L of Oxygen by [place]. 2. Hypokalemia: Replace with KCl tab orally 3. Leukocytosis: Stable. Patient is afebrile. No signs of infection or inflammation.  We will continue to monitor. 4. Sickle Cell Anemia: Hemoglobin is stable at baseline. Patient is hemodynamically stable. Continue to follow closely. 5. Chronic pain Syndrome: Continue home pain medications. 6. History of DVT/PE: Continue Xarelto.  Code Status: Full Code Family Communication: N/A Disposition Plan: Not yet ready for discharge  Jolyssa Oplinger  If 7PM-7AM, please contact night-coverage.  05/13/2019, 1:11 PM  LOS: 8 days

## 2019-05-14 LAB — BASIC METABOLIC PANEL
Anion gap: 7 (ref 5–15)
BUN: 8 mg/dL (ref 6–20)
CO2: 24 mmol/L (ref 22–32)
Calcium: 8.6 mg/dL — ABNORMAL LOW (ref 8.9–10.3)
Chloride: 106 mmol/L (ref 98–111)
Creatinine, Ser: 0.43 mg/dL — ABNORMAL LOW (ref 0.44–1.00)
GFR calc Af Amer: >60 mL/min (ref >60)
GFR calc non Af Amer: >60 mL/min (ref >60)
Glucose, Bld: 109 mg/dL — ABNORMAL HIGH (ref 70–99)
Potassium: 3.7 mmol/L (ref 3.5–5.1)
Sodium: 137 mmol/L (ref 135–145)

## 2019-05-14 MED ORDER — HYDROMORPHONE HCL 4 MG PO TABS: 4.0000 mg | ORAL_TABLET | Freq: Four times a day (QID) | ORAL | 0 refills | Status: DC | PRN

## 2019-05-14 MED ORDER — XARELTO 20 MG PO TABS: 20.0000 mg | ORAL_TABLET | Freq: Every day | ORAL | 1 refills | Status: DC

## 2019-05-14 NOTE — Discharge Summary (Signed)
Physician Discharge Summary  [facility] DQ:[id number] DOB: 09-28-1980 DOA: 05/04/2019  PCP: System, Provider Not In  Admit date: 05/04/2019  Discharge date: 05/14/2019  Discharge Diagnoses:  Principal Problem:   Sickle cell pain crisis ([place]) Active Problems:   Leukocytosis   History of pulmonary embolism   Hypokalemia   Thrombocytosis ([place])  Discharge Condition: Stable  Disposition:  Follow-up Information    Justice Britain, MD. Schedule an appointment as soon as possible for a visit in 3 day(s).   Specialty: Hematology Contact information: [address] [place] [id number] [phone]          Pt is discharged home in good condition and is to follow up with System, Provider Not In this week to have labs evaluated. [name] is instructed to increase activity slowly and balance with rest for the next few days, and use prescribed medication to complete treatment of pain  Diet: Regular Wt Readings from Last 3 Encounters:  05/12/19 54.8 kg  05/01/19 55.8 kg  04/25/19 56.6 kg    History of present illness:  Molly Gutierrez is a 38 y.o. 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 Patient states for the past 4 days she is having pain on both sides of her chest and it radiates to her posterior ribs on the left side.  The pain is severe, aching, and constant.  States this is similar to the pain she usually experiences with her sickle cell crisis.  No recent falls or injury to her chest.  Denies fevers, shortness of breath, or cough.  States she goes to a sickle cell clinic in [place].  Her pain has been uncontrolled despite taking her home Dilaudid.  States doses of IV Dilaudid she received in the ED have helped but at present she is having 8 out of 10 intensity pain.  She is requesting Benadryl for itching with  Dilaudid.  Patient has no other complaints.  ED Course: Afebrile.  Initially tachycardic on arrival, now improved.  Blood pressure intermittently soft, improved with 1 L normal saline bolus.  Not hypoxic.  EKG not suggestive of ACS.  WBC count 18.2. Hemoglobin 7.8, baseline in the 7-8 range.  Reticulocyte count chronically elevated, now slightly worse. Chest x-ray showing cardiomegaly with mild vascular congestion.  No confluent opacities, effusions, or edema. Patient received a total of 6 mg Dilaudid and Toradol 30 mg and continued to complain of pain.  Hospital Course:  Patient was admitted for sickle cell pain crisis and managed appropriately with IVF, IV Dilaudid via PCA and IV Toradol, as well as other adjunct therapies per sickle cell pain management protocols. All her home medications were also continued during this admission.  Hypokalemia was replenished. Patient's hemoglobin dropped below baseline to a nadir of 6.6 and was transfused with 1 unit of packed red blood cell with good response to hemoglobin concentration back to baseline of around 8. Patient had a prolonged stay in the hospital because of uncontrolled pain but as at today pain is at baseline, patient ambulating well with no restrictions. Patient also tolerating p.o. intake with no restrictions.  Her general condition has improved significantly. Hemoglobin remained stable at baseline, 8.0 at the time of discharge.  Serum potassium level at 3.7 at discharge. All laboratory parameters were within normal limits as well as her vital signs. Patient was discharged home today in a hemodynamically stable condition.  Patient was discharged on Dilaudid 4 mg tablet p.o. every 6 hourly, was given prescription of 30 tablets until she is able to follow-up with her hematologist, Dr. Altamese Cabal at [facility]. Patient was counseled extensively about chronic pain and other nonpharmacologic modalities of pain management. Patient verbalized understanding and  was appreciative of her care during this admission.  Discharge Exam: Vitals:   05/14/19 0954 05/14/19 1249  BP: 118/64   Pulse: 76   Resp: 14 10  Temp: 98 F (36.7 C)   SpO2: 98% 97%   Vitals:   05/14/19 0559 05/14/19 0838 05/14/19 0954 05/14/19 1249  BP: 102/76  118/64   Pulse: 83  76   Resp: 15 10 14 10   Temp: 99 F (37.2 C)  98 F (36.7 C)   TempSrc: Oral     SpO2: 98% 97% 98% 97%  Weight:      Height:       General appearance : Awake, alert, not in any distress. Speech Clear. Not toxic looking HEENT: Atraumatic and Normocephalic, pupils equally reactive to light and accomodation Neck: Supple, no JVD. No cervical lymphadenopathy.  Chest: Good air entry bilaterally, no added sounds  CVS: S1 S2 regular, no murmurs.  Abdomen: Bowel sounds present, Non tender and not distended with no gaurding, rigidity or rebound. Extremities: B/L Lower Ext shows no edema, both legs are warm to touch Neurology: Awake alert, and oriented X 3, CN II-XII intact, Non focal Skin: No Rash  Discharge Instructions  Discharge Instructions    Diet - low sodium heart healthy   Complete by: As directed    Increase activity slowly   Complete by: As directed      Allergies as of 05/14/2019      Reactions   Cefaclor Hives, Swelling   Hydroxyurea Other (See Comments), Palpitations   Lower blood levels and HR Other reaction(s): Hypotension, Other (See Comments) "it messes me up, it drops my levels and stuff" Lower blood levels and HR "it messes me up, it drops my levels and stuff"      Medication List    TAKE these medications   albuterol 108 (90 Base) MCG/ACT inhaler Commonly known as: VENTOLIN HFA Inhale 2 puffs into the lungs 2 (two) times daily as needed for wheezing or shortness of breath.   Deferasirox 360 MG Tabs Take 1,080 mg by mouth daily.   diphenhydrAMINE 25 mg capsule Commonly known as: BENADRYL Take 25 mg by mouth 3 (three) times daily as needed for itching.   folic  acid 1 MG tablet Commonly known as: FOLVITE Take 1 mg by mouth daily.   HYDROmorphone 4 MG tablet Commonly known as: DILAUDID Take 1 tablet (4 mg total) by mouth every 6 (six) hours as needed for severe pain.   mirtazapine 45 MG tablet Commonly known as: REMERON Take 45 mg by mouth at bedtime.   mometasone-formoterol 100-5 MCG/ACT Aero Commonly known as: DULERA Inhale 2 puffs into the lungs daily as needed for wheezing or shortness of breath.   omeprazole 20 MG capsule Commonly known as: PRILOSEC Take 20 mg by mouth 2 (two) times a day.   Oxbryta 500 MG Tabs tablet Generic drug: voxelotor Take 1,500 mg by mouth daily.   promethazine 25 MG tablet Commonly known as: PHENERGAN Take 25 mg by mouth every 6 (six) hours as needed for nausea/vomiting.   vitamin B-12 1000 MCG tablet Commonly known as: CYANOCOBALAMIN Take 1,000 mcg by mouth daily.   Vitamin D3 25 MCG (1000  UT) Caps Take 1,000 Units by mouth daily.   Xarelto 20 MG Tabs tablet Generic drug: rivaroxaban Take 1 tablet (20 mg total) by mouth daily.       The results of significant diagnostics from this hospitalization (including imaging, microbiology, ancillary and laboratory) are listed below for reference.    Significant Diagnostic Studies: Dg Chest 2 View  Result Date: 05/04/2019 CLINICAL DATA:  Chest pain EXAM: CHEST - 2 VIEW COMPARISON:  05/01/2019 FINDINGS: Bilateral Port-A-Cath remain in place, unchanged. Cardiomegaly with vascular congestion. No confluent opacities, effusions or edema. No acute bony abnormality. IMPRESSION: Cardiomegaly, mild vascular congestion. Electronically Signed   By: Rolm Baptise M.D.   On: 05/04/2019 19:30   Dg Chest 2 View  Result Date: 04/22/2019 CLINICAL DATA:  38 year old female with chest pain. EXAM: CHEST - 2 VIEW COMPARISON:  04/19/2019 and prior radiographs FINDINGS: Cardiomegaly, RIGHT IJ Port-A-Cath with tip overlying the LOWER SVC and LEFT IJ Port-A-Cath with tip  overlying the SUPERIOR cavoatrial junction again noted. New mild pulmonary vascular congestion and minimal interstitial opacities noted with possible trace bilateral pleural effusions. There is no evidence of pneumothorax, airspace disease or acute bony abnormality. IMPRESSION: 1. New mild pulmonary vascular congestion and minimal interstitial opacities with possible trace bilateral pleural effusions. 2. Stable cardiomegaly and Port-A-Cath. Electronically Signed   By: Margarette Canada M.D.   On: 04/22/2019 12:55   Ct Angio Chest Pe W And/or Wo Contrast  Result Date: 04/19/2019 CLINICAL DATA:  Chest pain EXAM: CT ANGIOGRAPHY CHEST WITH CONTRAST TECHNIQUE: Multidetector CT imaging of the chest was performed using the standard protocol during bolus administration of intravenous contrast. Multiplanar CT image reconstructions and MIPs were obtained to evaluate the vascular anatomy. CONTRAST:  123mL OMNIPAQUE IOHEXOL 350 MG/ML SOLN COMPARISON:  None. FINDINGS: Cardiovascular: Evaluation for pulmonary emboli is significantly limited by respiratory motion artifact.Given this limitation, no pulmonary embolism was detected. The heart size is enlarged. There is no CT evidence for an aortic dissection. There are well-positioned bilateral porta cast. Mediastinum/Nodes: --No mediastinal or hilar lymphadenopathy. --No axillary lymphadenopathy. --No supraclavicular lymphadenopathy. --Normal thyroid gland. --The esophagus is unremarkable Lungs/Pleura: No pulmonary nodules or masses. No pleural effusion or pneumothorax. No focal airspace consolidation. No focal pleural abnormality. Upper Abdomen: The spleen is heavily calcified consistent with the patient's history of sickle cell disease. Musculoskeletal: There is a well-circumscribed soft tissue mass adjacent to the T9 vertebral body on the right. There is a heterogeneous appearance of virtually all of the visualized osseous structures. There is a complex 2.3 x 1.5 cm right breast  mass with coarse peripheral calcifications. There is cortical thinning of the upper pole the right kidney. Review of the MIP images confirms the above findings. IMPRESSION: 1. Evaluation for pulmonary emboli is limited by respiratory motion artifact. Given this limitation, no PE was identified. 2. Sequela of sickle cell disease including evidence for extra medullary hematopoiesis. 3. Complex right breast mass measuring approximately 2.3 x 1.5 cm. Follow-up with a nonemergent outpatient mammogram is recommended. Electronically Signed   By: Constance Holster M.D.   On: 04/19/2019 06:12   Dg Chest Port 1 View  Result Date: 05/01/2019 CLINICAL DATA:  Sickle cell disease, back pain, weakness EXAM: PORTABLE CHEST 1 VIEW COMPARISON:  04/22/2019 chest radiograph. FINDINGS: Stable configuration of bilateral internal jugular central venous catheters terminating in the lower third of the SVC on the right and at the cavoatrial junction on the left. Stable cardiomediastinal silhouette with mild cardiomegaly. No pneumothorax. No pleural effusion. No  pulmonary edema. No acute consolidative airspace disease. Minimal reticular opacities at the right lung base appears similar. IMPRESSION: Mild cardiomegaly without pulmonary edema. No acute consolidative airspace disease. Minimal chronic right lung base reticular opacities favoring scarring. Electronically Signed   By: Ilona Sorrel M.D.   On: 05/01/2019 14:18   Dg Chest Portable 1 View  Result Date: 04/19/2019 CLINICAL DATA:  Sickle cell pain. EXAM: PORTABLE CHEST 1 VIEW COMPARISON:  04/17/2019 FINDINGS: There is stable Port-A-Cath bilaterally. The heart size is unchanged from prior study. Calcified granulomas are noted bilaterally. There is no pneumothorax. No large pleural effusion. No focal infiltrate. IMPRESSION: No active disease. Electronically Signed   By: Constance Holster M.D.   On: 04/19/2019 05:38   Dg Chest Port 1 View  Result Date: 04/17/2019 CLINICAL  DATA:  Chest pain. Sickle cell anemia. Asthma. EXAM: PORTABLE CHEST 1 VIEW COMPARISON:  10/02/2018 FINDINGS: Mildly enlarged cardiac silhouette without significant change. Stable bilateral jugular porta catheters. Clear lungs with normal vascularity. Unremarkable bones. IMPRESSION: Stable mild cardiomegaly. No acute abnormality. Electronically Signed   By: Claudie Revering M.D.   On: 04/17/2019 08:49    Microbiology: No results found for this or any previous visit (from the past 240 hour(s)).   Labs: Basic Metabolic Panel: Recent Labs  Lab 05/09/19 0912 05/10/19 0331 05/13/19 0339 05/14/19 0415  NA 138 141 136 137  K 3.7 3.4* 3.1* 3.7  CL 108 110 104 106  CO2 24 26 25 24   GLUCOSE 95 94 117* 109*  BUN 9 5* 6 8  CREATININE 0.53 0.45 0.37* 0.43*  CALCIUM 8.5* 8.3* 8.4* 8.6*   Liver Function Tests: Recent Labs  Lab 05/09/19 0912 05/10/19 0331 05/13/19 0339  AST 23 22 23   ALT 17 14 15   ALKPHOS 56 51 55  BILITOT 2.6* 2.1* 2.6*  PROT 6.9 6.4* 7.1  ALBUMIN 3.9 3.4* 3.9   No results for input(s): LIPASE, AMYLASE in the last 168 hours. No results for input(s): AMMONIA in the last 168 hours. CBC: Recent Labs  Lab 05/08/19 0514 05/09/19 0912 05/10/19 0331 05/11/19 0546 05/13/19 0339  WBC 13.3* 14.8* 11.7* 15.8* 12.7*  NEUTROABS  --  8.3* 6.5 9.9* 7.4  HGB 7.5* 7.8* 7.3* 7.5* 8.0*  HCT 23.1* 24.6* 23.2* 23.2* 25.0*  MCV 91.7 92.8 91.3 91.0 91.6  PLT 538* 590* 539* 572* 528*   Cardiac Enzymes: No results for input(s): CKTOTAL, CKMB, CKMBINDEX, TROPONINI in the last 168 hours. BNP: Invalid input(s): POCBNP CBG: No results for input(s): GLUCAP in the last 168 hours.  Time coordinating discharge: 50 minutes  Signed:  [facility] 05/14/2019, 1:06 PM

## 2019-05-14 NOTE — Progress Notes (Signed)
Pt is trying to find a ride but at this time, she has had no luck.  She lives in [place] so we can not help her with transportation.

## 2019-05-14 NOTE — Progress Notes (Signed)
Discharge instructions given to patient. Patient had no questions. Patient is trying to find someone to take her home. Her family is in [place] today. Patient requested IV Benadryl.

## 2019-05-14 NOTE — Discharge Instructions (Signed)
Sickle Cell Anemia, Adult ° °Sickle cell anemia is a condition in which red blood cells have an abnormal “sickle” shape. Red blood cells carry oxygen through the body. Sickle-shaped red blood cells do not live as long as normal red blood cells. They also clump together and block blood from flowing through the blood vessels. This condition prevents the body from getting enough oxygen. Sickle cell anemia causes organ damage and pain. It also increases the risk of infection. °What are the causes? °This condition is caused by a gene that is passed from parent to child (inherited). Receiving two copies of the gene causes the disease. Receiving one copy causes the "trait," which means that symptoms are milder or not present. °What increases the risk? °This condition is more likely to develop if your ancestors were from [place], the Mediterranean, South or [place], the [place], [place], or the Middle East. °What are the signs or symptoms? °Symptoms of this condition include: °· Episodes of pain (crises), especially in the hands and feet, joints, back, chest, or abdomen. The pain can be triggered by: °? An illness, especially if there is dehydration. °? Doing an activity with great effort (overexertion). °? Exposure to extreme temperature changes. °? High altitude. °· Fatigue. °· Shortness of breath or difficulty breathing. °· Dizziness. °· Pale skin or yellowed skin (jaundice). °· Frequent bacterial infections. °· Pain and swelling in the hands and feet (hand-food syndrome). °· Prolonged, painful erection of the penis (priapism). °· Acute chest syndrome. Symptoms of this include: °? Chest pain. °? Fever. °? Cough. °? Fast breathing. °· Stroke. °· Decreased activity. °· Loss of appetite. °· Change in behavior. °· Headaches. °· Seizures. °· Vision changes. °· Skin ulcers. °· Heart disease. °· High blood pressure. °· Gallstones. °· Liver and kidney problems. °How is this diagnosed? °This condition is diagnosed with  blood tests that check for the gene that causes this condition. °How is this treated? °There is no cure for most cases of this condition. Treatment focuses on managing your symptoms and preventing complications of the disease. Your health care provider will work with you to identify the best treatment options for you based on an assessment of your condition. Treatment may include: °· Medicines, including: °? Pain medicines. °? Antibiotic medicines for infection. °? Medicines to increase the production of a protein in red blood cells that helps carry oxygen in the body (hemoglobin). °· Fluids to treat pain and swelling. °· Oxygen to treat acute chest syndrome. °· Blood transfusions to treat symptoms such as fatigue, stroke, and acute chest syndrome. °· Massage and physical therapy for pain. °· Regular tests to monitor your condition, such as blood tests, X-rays, CT scans, MRI scans, ultrasounds, and lung function tests. These should be done every 3-12 months, depending on your age. °· Hematopoietic stem cell transplant. This is a procedure to replace abnormal stem cells with healthy stem cells from a donor's bone marrow. Stem cells are cells that can develop into blood cells, and bone marrow is the spongy tissue inside the bones. °Follow these instructions at home: °Medicines °· Take over-the-counter and prescription medicines only as told by your health care provider. °· If you were prescribed an antibiotic medicine, take it as told by your health care provider. Do not stop taking the antibiotic even if you start to feel better. °· If you develop a fever, do not take medicines to reduce the fever right away. This could cover up another problem. Notify your health care provider. °Managing   pain, stiffness, and swelling  Try these methods to help ease your pain: ? Using a heating pad. ? Taking a warm bath. ? Distracting yourself, such as by watching TV. Eating and drinking  Drink enough fluid to keep your urine  clear or pale yellow. Drink more in hot weather and during exercise.  Limit or avoid drinking alcohol.  Eat a balanced and nutritious diet. Eat plenty of fruits, vegetables, whole grains, and lean protein.  Take vitamins and supplements as directed by your health care provider. Traveling  When traveling, keep these with you: ? Your medical information. ? The names of your health care providers. ? Your medicines.  If you have to travel by air, ask about precautions you should take. Activity  Get plenty of rest.  Avoid activities that will lower your oxygen levels, such as exercising vigorously. General instructions  Do not use any products that contain nicotine or tobacco, such as cigarettes and e-cigarettes. They lower blood oxygen levels. If you need help quitting, ask your health care provider.  Consider wearing a medical alert bracelet.  Avoid high altitudes.  Avoid extreme temperatures and extreme temperature changes.  Keep all follow-up visits as told by your health care provider. This is important. Contact a health care provider if:  You develop joint pain.  Your feet or hands swell or have pain.  You have fatigue. Get help right away if:  You have symptoms of infection. These include: ? Fever. ? Chills. ? Extreme tiredness. ? Irritability. ? Poor eating. ? Vomiting.  You feel dizzy or faint.  You have new abdominal pain, especially on the left side near the stomach area.  You develop priapism.  You have numbness in your arms or legs or have trouble moving them.  You have trouble talking.  You develop pain that cannot be controlled with medicine.  You become short of breath.  You have rapid breathing.  You have a persistent cough.  You have pain in your chest.  You develop a severe headache or stiff neck.  You feel bloated without eating or after eating a small amount of food.  Your skin is pale.  You suddenly lose  vision. Summary  Sickle cell anemia is a condition in which red blood cells have an abnormal sickle shape. This disease can cause organ damage and chronic pain, and it can raise your risk of infection.  Sickle cell anemia is a genetic disorder.  Treatment focuses on managing your symptoms and preventing complications of the disease.  Get medical help right away if you have any signs of infection, such as a fever. This information is not intended to replace advice given to you by your health care provider. Make sure you discuss any questions you have with your health care provider. Document Released: 09/11/2005 Document Revised: 09/25/2018 Document Reviewed: 07/09/2016 Elsevier Patient Education  [place].  Chronic Pain, Adult Chronic pain is a type of pain that lasts or keeps coming back (recurs) for at least six months. You may have chronic headaches, abdominal pain, or body pain. Chronic pain may be related to an illness, such as fibromyalgia or complex regional pain syndrome. Sometimes the cause of chronic pain is not known. Chronic pain can make it hard for you to do daily activities. If not treated, chronic pain can lead to other health problems, including anxiety and depression. Treatment depends on the cause and severity of your pain. You may need to work with a pain specialist to come  up with a treatment plan. The plan may include medicine, counseling, and physical therapy. Many people benefit from a combination of two or more types of treatment to control their pain. Follow these instructions at home: Lifestyle  Consider keeping a pain diary to share with your health care providers.  Consider talking with a mental health care provider (psychologist) about how to cope with chronic pain.  Consider joining a chronic pain support group.  Try to control or lower your stress levels. Talk to your health care provider about strategies to do this. General instructions   Take  over-the-counter and prescription medicines only as told by your health care provider.  Follow your treatment plan as told by your health care provider. This may include: ? Gentle, regular exercise. ? Eating a healthy diet that includes foods such as vegetables, fruits, fish, and lean meats. ? Cognitive or behavioral therapy. ? Working with a Community education officer. ? Meditation or yoga. ? Acupuncture or massage therapy. ? Aroma, color, light, or sound therapy. ? Local electrical stimulation. ? Shots (injections) of numbing or pain-relieving medicines into the spine or the area of pain.  Check your pain level as told by your health care provider. Ask your health care provider if you should use a pain scale.  Learn as much as you can about how to manage your chronic pain. Ask your health care provider if an intensive pain rehabilitation program or a chronic pain specialist would be helpful.  Keep all follow-up visits as told by your health care provider. This is important. Contact a health care provider if:  Your pain gets worse.  You have new pain.  You have trouble sleeping.  You have trouble doing your normal activities.  Your pain is not controlled with treatment.  Your have side effects from pain medicine.  You feel weak. Get help right away if:  You lose feeling or have numbness in your body.  You lose control of bowel or bladder function.  Your pain suddenly gets much worse.  You develop shaking or chills.  You develop confusion.  You develop chest pain.  You have trouble breathing or shortness of breath.  You pass out.  You have thoughts about hurting yourself or others. This information is not intended to replace advice given to you by your health care provider. Make sure you discuss any questions you have with your health care provider. Document Released: 02/23/2002 Document Revised: 05/16/2017 Document Reviewed: 11/21/2015 Elsevier Patient Education  2020  [employer].

## 2019-05-14 NOTE — TOC Transition Note (Signed)
Transition of Care [facility]) - CM/SW Discharge Note   Patient Details  Name: Molly Gutierrez MRN: [id number] Date of Birth: 1981/04/22  Transition of Care [facility]) CM/SW Contact:  Dessa Phi, RN Phone Number: 05/14/2019, 5:00 PM   Clinical Narrative: Request for taxi voucher-unable to accomodate due to- patient has health insurance,  is ambulatory, & distance must be within 50 mile range. No further CM needs.           Patient Goals and CMS Choice        Discharge Placement                       Discharge Plan and Services                                     Social Determinants of Health (SDOH) Interventions     Readmission Risk Interventions No flowsheet data found.

## 2019-05-15 MED ORDER — HEPARIN SOD (PORK) LOCK FLUSH 100 UNIT/ML IV SOLN
500.0000 [IU] | INTRAVENOUS | Status: AC | PRN
  Administered 2019-05-15: 500 [IU]

## 2019-05-15 NOTE — Progress Notes (Signed)
Assessment unchanged. Pt verbalized understanding of dc instructions through teach back. Discharged via wc accompanied by NT.

## 2019-05-21 ENCOUNTER — Emergency Department (HOSPITAL_COMMUNITY)
Admission: EM | Admit: 2019-05-21 | Discharge: 2019-05-22 | Disposition: A | Discharge status: Home / Self Care | Payer: Medicare Other | Attending: Emergency Medicine | Admitting: Emergency Medicine

## 2019-05-21 ENCOUNTER — Other Ambulatory Visit: Payer: Self-pay

## 2019-05-21 ENCOUNTER — Emergency Department (HOSPITAL_COMMUNITY): Payer: Medicare Other

## 2019-05-21 ENCOUNTER — Encounter (HOSPITAL_COMMUNITY): Payer: Self-pay

## 2019-05-21 DIAGNOSIS — J45909 Unspecified asthma, uncomplicated: Secondary | ICD-10-CM | POA: Diagnosis not present

## 2019-05-21 DIAGNOSIS — Z79899 Other long term (current) drug therapy: Secondary | ICD-10-CM | POA: Insufficient documentation

## 2019-05-21 DIAGNOSIS — D5700 Hb-SS disease with crisis, unspecified: Secondary | ICD-10-CM | POA: Insufficient documentation

## 2019-05-21 DIAGNOSIS — R0789 Other chest pain: Secondary | ICD-10-CM | POA: Diagnosis present

## 2019-05-21 LAB — RETICULOCYTES
Immature Retic Fract: 28.7 % — ABNORMAL HIGH (ref 2.3–15.9)
RBC.: 2.81 MIL/uL — ABNORMAL LOW (ref 3.87–5.11)
Retic Count, Absolute: 227.6 10*3/uL — ABNORMAL HIGH (ref 19.0–186.0)
Retic Ct Pct: 8.1 % — ABNORMAL HIGH (ref 0.4–3.1)

## 2019-05-21 LAB — COMPREHENSIVE METABOLIC PANEL
ALT: 19 U/L (ref 0–44)
AST: 29 U/L (ref 15–41)
Albumin: 4.6 g/dL (ref 3.5–5.0)
Alkaline Phosphatase: 71 U/L (ref 38–126)
Anion gap: 10 (ref 5–15)
BUN: 8 mg/dL (ref 6–20)
CO2: 21 mmol/L — ABNORMAL LOW (ref 22–32)
Calcium: 9.1 mg/dL (ref 8.9–10.3)
Chloride: 106 mmol/L (ref 98–111)
Creatinine, Ser: 0.46 mg/dL (ref 0.44–1.00)
GFR calc Af Amer: >60 mL/min (ref >60)
GFR calc non Af Amer: >60 mL/min (ref >60)
Glucose, Bld: 98 mg/dL (ref 70–99)
Potassium: 3.8 mmol/L (ref 3.5–5.1)
Sodium: 137 mmol/L (ref 135–145)
Total Bilirubin: 3.8 mg/dL — ABNORMAL HIGH (ref 0.3–1.2)
Total Protein: 8.6 g/dL — ABNORMAL HIGH (ref 6.5–8.1)

## 2019-05-21 LAB — I-STAT BETA HCG BLOOD, ED (MC, WL, AP ONLY): I-stat hCG, quantitative: <5 m[IU]/mL (ref <5)

## 2019-05-21 LAB — CBC
HCT: 25 % — ABNORMAL LOW (ref 36.0–46.0)
Hemoglobin: 8.1 g/dL — ABNORMAL LOW (ref 12.0–15.0)
MCH: 28.8 pg (ref 26.0–34.0)
MCHC: 32.4 g/dL (ref 30.0–36.0)
MCV: 89 fL (ref 80.0–100.0)
Platelets: 646 10*3/uL — ABNORMAL HIGH (ref 150–400)
RBC: 2.81 MIL/uL — ABNORMAL LOW (ref 3.87–5.11)
RDW: 18.4 % — ABNORMAL HIGH (ref 11.5–15.5)
WBC: 16.8 10*3/uL — ABNORMAL HIGH (ref 4.0–10.5)
nRBC: 0.1 % (ref 0.0–0.2)

## 2019-05-21 LAB — TROPONIN I (HIGH SENSITIVITY): Troponin I (High Sensitivity): <2 ng/L (ref <18)

## 2019-05-21 MED ORDER — DIPHENHYDRAMINE HCL 50 MG/ML IJ SOLN
25.0000 mg | Freq: Once | INTRAMUSCULAR | Status: AC
  Administered 2019-05-21: 25 mg via INTRAVENOUS
  Filled 2019-05-21: qty 1

## 2019-05-21 MED ORDER — SODIUM CHLORIDE 0.9% FLUSH
3.0000 mL | Freq: Once | INTRAVENOUS | Status: AC
  Administered 2019-05-21: 3 mL via INTRAVENOUS

## 2019-05-21 MED ORDER — DIPHENHYDRAMINE HCL 50 MG/ML IJ SOLN
12.5000 mg | Freq: Once | INTRAMUSCULAR | Status: AC
  Administered 2019-05-21: 12.5 mg via INTRAVENOUS
  Filled 2019-05-21: qty 1

## 2019-05-21 MED ORDER — PROMETHAZINE HCL 25 MG/ML IJ SOLN
6.2500 mg | Freq: Once | INTRAMUSCULAR | Status: AC
  Administered 2019-05-21: 6.25 mg via INTRAVENOUS
  Filled 2019-05-21: qty 1

## 2019-05-21 MED ORDER — HYDROMORPHONE HCL 2 MG/ML IJ SOLN
2.0000 mg | Freq: Once | INTRAMUSCULAR | Status: AC
  Administered 2019-05-21: 2 mg via INTRAVENOUS
  Filled 2019-05-21: qty 1

## 2019-05-21 MED ORDER — SODIUM CHLORIDE 0.9 % IV BOLUS (SEPSIS)
1000.0000 mL | Freq: Once | INTRAVENOUS | Status: AC
  Administered 2019-05-21: 1000 mL via INTRAVENOUS

## 2019-05-21 MED ORDER — KETOROLAC TROMETHAMINE 15 MG/ML IJ SOLN
15.0000 mg | Freq: Once | INTRAMUSCULAR | Status: AC
  Administered 2019-05-21: 15 mg via INTRAVENOUS
  Filled 2019-05-21: qty 1

## 2019-05-21 NOTE — ED Notes (Signed)
Pt ambulatory from triage 

## 2019-05-21 NOTE — Discharge Instructions (Signed)
You are seen today for sickle cell pain.  You are treated with fluids, pain medicine, Benadryl and nausea medicine.You lab work was at your baseline and your chest xray and ekg were reassuring. You are encouraged to go home and rest and take your pain medications and drink lots of fluids. Thank you for allowing me to care for you today. Please return to the emergency department if you have new or worsening symptoms.

## 2019-05-21 NOTE — ED Provider Notes (Addendum)
[place] DEPT Provider Note   CSN: [id number] Arrival date & time: 05/21/19  [id number]     History   Chief Complaint Chief Complaint  Patient presents with  . Sickle Cell Pain Crisis  . Chest Pain    HPI Molly Gutierrez is a 38 y.o. female.     Patient is a 38 year old female with past medical history of sickle cell anemia presenting to the emergency department for sickle cell pain.  Patient reports that she began to feel worsening sickle cell pain including chest pain which is usual for her flareups this afternoon.  Reports that she last took her Dilaudid at 8 AM and she refrain from taking it during the day because she does not take her pain medications while around her kids who she was trying to help with virtual learning.  Reports that she got behind on her pain and this is what caused the pain crisis.  Reports that she has diffuse chest pain across the anterior chest but this is normal for her flareups.  Denies any fever, cough, URI symptoms, Covid exposure.  Reports 2 episodes of vomiting but no diarrhea, dysuria, belly pain.     Past Medical History:  Diagnosis Date  . Asthma   . Eczema   . Sickle cell anemia [facility])     Patient Active Problem List   Diagnosis Date Noted  . Hypokalemia 05/05/2019  . Thrombocytosis ([place]) 05/05/2019  . Breast mass in female   . Sickle cell pain crisis ([place]) 04/19/2019  . Leukocytosis 04/19/2019  . Chronic pain syndrome 04/19/2019  . History of pulmonary embolism 04/19/2019    History reviewed. No pertinent surgical history.   OB History   No obstetric history on file.      Home Medications    Prior to Admission medications   Medication Sig Start Date End Date Taking? Authorizing Provider  albuterol (VENTOLIN HFA) 108 (90 Base) MCG/ACT inhaler Inhale 2 puffs into the lungs 2 (two) times daily as needed for wheezing or shortness of breath. 12/31/16  Yes [provider]  Cholecalciferol  (VITAMIN D3) 25 MCG (1000 UT) CAPS Take 1,000 Units by mouth daily.   Yes [provider]  Deferasirox 360 MG TABS Take 1,080 mg by mouth daily. 09/27/16  Yes [provider]  diphenhydrAMINE (BENADRYL) 25 mg capsule Take 25 mg by mouth 3 (three) times daily as needed for itching.   Yes [provider]  folic acid (FOLVITE) 1 MG tablet Take 1 mg by mouth daily. 12/31/16  Yes [provider]  HYDROmorphone (DILAUDID) 4 MG tablet Take 1 tablet (4 mg total) by mouth every 6 (six) hours as needed for severe pain. 05/14/19  Yes [name], MD  mirtazapine (REMERON) 45 MG tablet Take 45 mg by mouth at bedtime. 09/30/18  Yes [provider]  mometasone-formoterol (DULERA) 100-5 MCG/ACT AERO Inhale 2 puffs into the lungs daily as needed for wheezing or shortness of breath.   Yes [provider]  omeprazole (PRILOSEC) 20 MG capsule Take 20 mg by mouth 2 (two) times a day. 09/08/18  Yes [provider]  promethazine (PHENERGAN) 25 MG tablet Take 25 mg by mouth every 6 (six) hours as needed for nausea/vomiting.   Yes [provider]  vitamin B-12 (CYANOCOBALAMIN) 1000 MCG tablet Take 1,000 mcg by mouth daily.   Yes [provider]  voxelotor (OXBRYTA) 500 MG TABS tablet Take 1,500 mg by mouth daily.   Yes [provider]  XARELTO 20 MG TABS tablet Take 1 tablet (20 mg total) by mouth daily. 05/14/19  Yes [name], MD    Family History Family History  Problem Relation Age of Onset  . Renal Disease Mother   . Hypertension Mother   . High Cholesterol Mother     Social History Social History   Tobacco Use  . Smoking status: Never Smoker  . Smokeless tobacco: Never Used  Substance Use Topics  . Alcohol use: Never    Frequency: Never  . Drug use: Never     Allergies   Cefaclor and Hydroxyurea   Review of Systems Review of Systems  Constitutional: Negative for appetite change, chills and  fever.  HENT: Negative for congestion and sore throat.   Eyes: Negative for photophobia.  Respiratory: Negative for cough and shortness of breath.   Cardiovascular: Positive for chest pain. Negative for palpitations and leg swelling.  Gastrointestinal: Negative for abdominal pain, diarrhea, nausea and vomiting.  Genitourinary: Negative for dysuria and hematuria.  Musculoskeletal: Negative for back pain.  Skin: Negative for rash.  Neurological: Negative for dizziness, light-headedness and headaches.     Physical Exam Updated Vital Signs BP 118/86   Pulse 85   Temp 99.5 F (37.5 C) (Oral)   Resp 15   Ht 5\' 3"  (1.6 m)   Wt 54.8 kg   LMP 04/25/2019   SpO2 96%   BMI 21.40 kg/m   Physical Exam Vitals signs and nursing note reviewed.  Constitutional:      General: She is not in acute distress.    Appearance: Normal appearance. She is well-developed. She is not ill-appearing, toxic-appearing or diaphoretic.     Comments: Appears uncomfortable and in pain  HENT:     Head: Normocephalic.  Eyes:     Conjunctiva/sclera: Conjunctivae normal.     Pupils: Pupils are equal, round, and reactive to light.  Neck:     Musculoskeletal: Normal range of motion.  Cardiovascular:     Rate and Rhythm: Regular rhythm. Tachycardia present.     Heart sounds: Normal heart sounds.  Pulmonary:     Effort: Pulmonary effort is normal.     Breath sounds: Normal breath sounds.  Chest:     Chest wall: Tenderness (Diffuse anterior tenderness) present. No mass.  Abdominal:     General: Bowel sounds are normal.     Palpations: Abdomen is soft.  Skin:    General: Skin is dry.  Neurological:     General: No focal deficit present.     Mental Status: She is alert.  Psychiatric:        Mood and Affect: Mood normal.      ED Treatments / Results  Labs (all labs ordered are listed, but only abnormal results are displayed) Labs Reviewed  CBC - Abnormal; Notable for the following components:       Result Value   WBC 16.8 (*)    RBC 2.81 (*)    Hemoglobin 8.1 (*)    HCT 25.0 (*)    RDW 18.4 (*)    Platelets 646 (*)    All other components within normal limits  COMPREHENSIVE METABOLIC PANEL - Abnormal; Notable for the following components:   CO2 21 (*)    Total Protein 8.6 (*)    Total Bilirubin 3.8 (*)    All other components within normal limits  RETICULOCYTES - Abnormal; Notable for the following components:   Retic Ct Pct 8.1 (*)  RBC. 2.81 (*)    Retic Count, Absolute 227.6 (*)    Immature Retic Fract 28.7 (*)    All other components within normal limits  I-STAT BETA HCG BLOOD, ED ([facility], [facility], [facility] ONLY)  TROPONIN I (HIGH SENSITIVITY)    EKG EKG Interpretation  Date/Time:  Friday May 21 2019 18:04:09 EST Ventricular Rate:  104 PR Interval:    QRS Duration: 84 QT Interval:  367 QTC Calculation: 483 R Axis:   52 Text Interpretation: Sinus tachycardia Abnormal R-wave progression, early transition Borderline T abnormalities, anterior leads No significant change since last tracing Confirmed by Blanchie Dessert [id number]) on 05/21/2019 8:13:12 PM   Radiology Dg Chest 2 View  Result Date: 05/21/2019 CLINICAL DATA:  Chest pain.  Sickle cell disease.  History of asthma EXAM: CHEST - 2 VIEW COMPARISON:  05/04/2019 FINDINGS: Bilateral porta caths remain in place. Mild cardiac enlargement. Chronic interstitial coarsening. No superimposed airspace consolidation. No pleural effusion or edema. IMPRESSION: No acute cardiopulmonary abnormalities. Electronically Signed   By: Kerby Moors M.D.   On: 05/21/2019 18:53    Procedures Procedures (including critical care time)  Medications Ordered in ED Medications  sodium chloride 0.9 % bolus 1,000 mL (1,000 mLs Intravenous New Bag/Given 05/21/19 2050)  sodium chloride flush (NS) 0.9 % injection 3 mL (3 mLs Intravenous Given 05/21/19 2054)  sodium chloride 0.9 % bolus 1,000 mL (1,000 mLs Intravenous New Bag/Given 05/21/19 1952)   ketorolac (TORADOL) 15 MG/ML injection 15 mg (15 mg Intravenous Given 05/21/19 1934)  promethazine (PHENERGAN) injection 6.25 mg (6.25 mg Intravenous Given 05/21/19 1955)  diphenhydrAMINE (BENADRYL) injection 25 mg (25 mg Intravenous Given 05/21/19 1934)  HYDROmorphone (DILAUDID) injection 2 mg (2 mg Intravenous Given 05/21/19 1956)  HYDROmorphone (DILAUDID) injection 2 mg (2 mg Intravenous Given 05/21/19 2047)  diphenhydrAMINE (BENADRYL) injection 25 mg (25 mg Intravenous Given 05/21/19 2047)  promethazine (PHENERGAN) injection 6.25 mg (6.25 mg Intravenous Given 05/21/19 2054)     Initial Impression / Assessment and Plan / ED Course  I have reviewed the triage vital signs and the nursing notes.  Pertinent labs & imaging results that were available during my care of the patient were reviewed by me and considered in my medical decision making (see chart for details).  Clinical Course as of May 20 2104  Fri May 21, 2019  1923 Patient with sickle cell anemia presenting with chest and body pain consistent with her usual sickle cell flareups in the context of not having any pain medication since this morning.  Denying any concerning symptoms including shortness of breath, fever.  Mildly tachycardic to 115 on initial presentation but other vital signs normal.  Plan to hydrate, pain control, nausea control, labs and reassess   [KM]  2036 Patient labs are reassuring and close to her baseline with hemoglobin of 8.1. EKG and chest xray unremarkable. Patient mildly improved with initial dose of meds and will be given another round. Patient reports she would like to go home after second round of meds if improved. Patient passed to Dr. Maryan Rued for discharge due to change of shift   [KM]    Clinical Course User Index [KM] Alveria Apley, PA-C      CRITICAL CARE Performed by: Alveria Apley   Total critical care time: 35 minutes  Critical care time was exclusive of separately billable procedures and  treating other patients.  Critical care was necessary to treat or prevent imminent or life-threatening deterioration.  Critical care was time spent personally  by me on the following activities: development of treatment plan with patient and/or surrogate as well as nursing, discussions with consultants, evaluation of patient's response to treatment, examination of patient, obtaining history from patient or surrogate, ordering and performing treatments and interventions, ordering and review of laboratory studies, ordering and review of radiographic studies, pulse oximetry and re-evaluation of patient's condition.    Final Clinical Impressions(s) / ED Diagnoses   Final diagnoses:  Sickle cell pain crisis [facility])    ED Discharge Orders    None       Kristine Royal 05/21/19 2055    Kristine Royal 05/21/19 2105    Blanchie Dessert, MD 05/22/19 0026    Alveria Apley, PA-C 06/01/19 1034    Blanchie Dessert, MD 06/01/19 2133

## 2019-05-21 NOTE — ED Triage Notes (Signed)
Patient c/o sickle cell pain in chest since last night. Patient denies any SOB.

## 2019-05-21 NOTE — ED Notes (Signed)
Patient did not want labs drawn at this time. Patient wants to wait until her port is accessed.

## 2019-05-22 DIAGNOSIS — D5700 Hb-SS disease with crisis, unspecified: Secondary | ICD-10-CM | POA: Diagnosis not present

## 2019-05-22 MED ORDER — HEPARIN SOD (PORK) LOCK FLUSH 100 UNIT/ML IV SOLN
500.0000 [IU] | Freq: Once | INTRAVENOUS | Status: AC
  Administered 2019-05-22: 500 [IU]
  Filled 2019-05-22: qty 5

## 2019-05-23 ENCOUNTER — Encounter (HOSPITAL_COMMUNITY): Payer: Self-pay

## 2019-05-23 ENCOUNTER — Emergency Department (HOSPITAL_COMMUNITY)
Admission: EM | Admit: 2019-05-23 | Discharge: 2019-05-23 | Disposition: A | Discharge status: Home / Self Care | Payer: Medicare Other | Source: Home / Self Care | Attending: Emergency Medicine | Admitting: Emergency Medicine

## 2019-05-23 ENCOUNTER — Emergency Department (HOSPITAL_COMMUNITY): Payer: Medicare Other

## 2019-05-23 ENCOUNTER — Other Ambulatory Visit: Payer: Self-pay

## 2019-05-23 DIAGNOSIS — D5700 Hb-SS disease with crisis, unspecified: Secondary | ICD-10-CM

## 2019-05-23 DIAGNOSIS — J45909 Unspecified asthma, uncomplicated: Secondary | ICD-10-CM | POA: Insufficient documentation

## 2019-05-23 DIAGNOSIS — Z79899 Other long term (current) drug therapy: Secondary | ICD-10-CM | POA: Insufficient documentation

## 2019-05-23 DIAGNOSIS — D571 Sickle-cell disease without crisis: Secondary | ICD-10-CM | POA: Insufficient documentation

## 2019-05-23 DIAGNOSIS — R0789 Other chest pain: Secondary | ICD-10-CM | POA: Insufficient documentation

## 2019-05-23 LAB — CBC WITH DIFFERENTIAL/PLATELET
Abs Immature Granulocytes: 0.1 10*3/uL — ABNORMAL HIGH (ref 0.00–0.07)
Basophils Absolute: 0.1 10*3/uL (ref 0.0–0.1)
Basophils Relative: 1 %
Eosinophils Absolute: 0.1 10*3/uL (ref 0.0–0.5)
Eosinophils Relative: 1 %
HCT: 22.9 % — ABNORMAL LOW (ref 36.0–46.0)
Hemoglobin: 7.5 g/dL — ABNORMAL LOW (ref 12.0–15.0)
Immature Granulocytes: 1 %
Lymphocytes Relative: 23 %
Lymphs Abs: 3.8 10*3/uL (ref 0.7–4.0)
MCH: 29.3 pg (ref 26.0–34.0)
MCHC: 32.8 g/dL (ref 30.0–36.0)
MCV: 89.5 fL (ref 80.0–100.0)
Monocytes Absolute: 1.8 10*3/uL — ABNORMAL HIGH (ref 0.1–1.0)
Monocytes Relative: 11 %
Neutro Abs: 10.8 10*3/uL — ABNORMAL HIGH (ref 1.7–7.7)
Neutrophils Relative %: 63 %
Platelets: 651 10*3/uL — ABNORMAL HIGH (ref 150–400)
RBC: 2.56 MIL/uL — ABNORMAL LOW (ref 3.87–5.11)
RDW: 19.2 % — ABNORMAL HIGH (ref 11.5–15.5)
WBC: 16.7 10*3/uL — ABNORMAL HIGH (ref 4.0–10.5)
nRBC: 0.2 % (ref 0.0–0.2)

## 2019-05-23 LAB — COMPREHENSIVE METABOLIC PANEL
ALT: 17 U/L (ref 0–44)
AST: 26 U/L (ref 15–41)
Albumin: 4.2 g/dL (ref 3.5–5.0)
Alkaline Phosphatase: 63 U/L (ref 38–126)
Anion gap: 8 (ref 5–15)
BUN: <5 mg/dL — ABNORMAL LOW (ref 6–20)
CO2: 22 mmol/L (ref 22–32)
Calcium: 8.6 mg/dL — ABNORMAL LOW (ref 8.9–10.3)
Chloride: 108 mmol/L (ref 98–111)
Creatinine, Ser: 0.39 mg/dL — ABNORMAL LOW (ref 0.44–1.00)
GFR calc Af Amer: >60 mL/min (ref >60)
GFR calc non Af Amer: >60 mL/min (ref >60)
Glucose, Bld: 92 mg/dL (ref 70–99)
Potassium: 3.4 mmol/L — ABNORMAL LOW (ref 3.5–5.1)
Sodium: 138 mmol/L (ref 135–145)
Total Bilirubin: 4.4 mg/dL — ABNORMAL HIGH (ref 0.3–1.2)
Total Protein: 7.9 g/dL (ref 6.5–8.1)

## 2019-05-23 LAB — RETICULOCYTES
Immature Retic Fract: 24.6 % — ABNORMAL HIGH (ref 2.3–15.9)
RBC.: 2.56 MIL/uL — ABNORMAL LOW (ref 3.87–5.11)
Retic Count, Absolute: 259.6 10*3/uL — ABNORMAL HIGH (ref 19.0–186.0)
Retic Ct Pct: 10.1 % — ABNORMAL HIGH (ref 0.4–3.1)

## 2019-05-23 LAB — I-STAT BETA HCG BLOOD, ED (MC, WL, AP ONLY): I-stat hCG, quantitative: <5 m[IU]/mL (ref <5)

## 2019-05-23 LAB — TROPONIN I (HIGH SENSITIVITY): Troponin I (High Sensitivity): <2 ng/L (ref <18)

## 2019-05-23 MED ORDER — ONDANSETRON HCL 4 MG/2ML IJ SOLN
4.0000 mg | Freq: Once | INTRAMUSCULAR | Status: AC
  Administered 2019-05-23: 4 mg via INTRAVENOUS
  Filled 2019-05-23: qty 2

## 2019-05-23 MED ORDER — SODIUM CHLORIDE 0.9 % IV BOLUS
500.0000 mL | Freq: Once | INTRAVENOUS | Status: AC
  Administered 2019-05-23: 500 mL via INTRAVENOUS

## 2019-05-23 MED ORDER — DIPHENHYDRAMINE HCL 50 MG/ML IJ SOLN
25.0000 mg | Freq: Once | INTRAMUSCULAR | Status: AC
  Administered 2019-05-23: 25 mg via INTRAVENOUS
  Filled 2019-05-23: qty 1

## 2019-05-23 MED ORDER — SODIUM CHLORIDE 0.9% FLUSH: 3.0000 mL | Freq: Once | INTRAVENOUS | Status: DC

## 2019-05-23 MED ORDER — POTASSIUM CHLORIDE CRYS ER 20 MEQ PO TBCR
40.0000 meq | EXTENDED_RELEASE_TABLET | Freq: Once | ORAL | Status: AC
  Administered 2019-05-23: 40 meq via ORAL
  Filled 2019-05-23: qty 2

## 2019-05-23 MED ORDER — HEPARIN SOD (PORK) LOCK FLUSH 100 UNIT/ML IV SOLN
500.0000 [IU] | Freq: Once | INTRAVENOUS | Status: AC
  Administered 2019-05-23: 500 [IU]
  Filled 2019-05-23: qty 5

## 2019-05-23 MED ORDER — HYDROMORPHONE HCL 1 MG/ML IJ SOLN
1.0000 mg | Freq: Once | INTRAMUSCULAR | Status: AC
  Administered 2019-05-23: 1 mg via INTRAVENOUS
  Filled 2019-05-23: qty 1

## 2019-05-23 MED ORDER — KETOROLAC TROMETHAMINE 30 MG/ML IJ SOLN
15.0000 mg | Freq: Once | INTRAMUSCULAR | Status: AC
  Administered 2019-05-23: 15 mg via INTRAVENOUS
  Filled 2019-05-23: qty 1

## 2019-05-23 NOTE — ED Provider Notes (Addendum)
[place] DEPT Provider Note   CSN: [id number] Arrival date & time: 05/23/19  1214     History   Chief Complaint Chief Complaint  Patient presents with  . Chest Pain  . Sickle Cell Pain Crisis    HPI Molly Gutierrez is a 38 y.o. female.     Patient with hx sickle cell disease, c/o pain to bilateral/diffuse chest which she indicates is typical of her sickle cell pain crisis. Pain gradual onset in past 2-3 days, constant, dull, non radiating, not relieved w her po pain meds at home. Denies chest wall injury or strain. No cough or uri symptoms. No known covid + exposure. Denies trouble swallowing, good po intake, although did have single episode of nv today - not bloody or bilious. No abd pain. Having normal bms. No dysuria or gu c/o. No fever or chills. No leg pain or swelling. No sob.   The history is provided by the patient.  Chest Pain Associated symptoms: nausea   Associated symptoms: no abdominal pain, no back pain, no cough, no fever, no headache, no palpitations and no shortness of breath   Sickle Cell Pain Crisis Associated symptoms: chest pain and nausea   Associated symptoms: no cough, no fever, no headaches, no shortness of breath and no sore throat     Past Medical History:  Diagnosis Date  . Asthma   . Eczema   . Sickle cell anemia [facility])     Patient Active Problem List   Diagnosis Date Noted  . Hypokalemia 05/05/2019  . Thrombocytosis ([place]) 05/05/2019  . Breast mass in female   . Sickle cell pain crisis ([place]) 04/19/2019  . Leukocytosis 04/19/2019  . Chronic pain syndrome 04/19/2019  . History of pulmonary embolism 04/19/2019    History reviewed. No pertinent surgical history.   OB History   No obstetric history on file.      Home Medications    Prior to Admission medications   Medication Sig Start Date End Date Taking? Authorizing Provider  albuterol (VENTOLIN HFA) 108 (90 Base) MCG/ACT inhaler Inhale 2  puffs into the lungs 2 (two) times daily as needed for wheezing or shortness of breath. 12/31/16   [provider]  Cholecalciferol (VITAMIN D3) 25 MCG (1000 UT) CAPS Take 1,000 Units by mouth daily.    [provider]  Deferasirox 360 MG TABS Take 1,080 mg by mouth daily. 09/27/16   [provider]  diphenhydrAMINE (BENADRYL) 25 mg capsule Take 25 mg by mouth 3 (three) times daily as needed for itching.    [provider]  folic acid (FOLVITE) 1 MG tablet Take 1 mg by mouth daily. 12/31/16   [provider]  HYDROmorphone (DILAUDID) 4 MG tablet Take 1 tablet (4 mg total) by mouth every 6 (six) hours as needed for severe pain. 05/14/19   [name], MD  mirtazapine (REMERON) 45 MG tablet Take 45 mg by mouth at bedtime. 09/30/18   [provider]  mometasone-formoterol (DULERA) 100-5 MCG/ACT AERO Inhale 2 puffs into the lungs daily as needed for wheezing or shortness of breath.    [provider]  omeprazole (PRILOSEC) 20 MG capsule Take 20 mg by mouth 2 (two) times a day. 09/08/18   [provider]  promethazine (PHENERGAN) 25 MG tablet Take 25 mg by mouth every 6 (six) hours as needed for nausea/vomiting.    [provider]  vitamin B-12 (CYANOCOBALAMIN) 1000 MCG tablet Take 1,000 mcg by mouth daily.  [provider]  voxelotor (OXBRYTA) 500 MG TABS tablet Take 1,500 mg by mouth daily.    [provider]  XARELTO 20 MG TABS tablet Take 1 tablet (20 mg total) by mouth daily. 05/14/19   [name], MD    Family History Family History  Problem Relation Age of Onset  . Renal Disease Mother   . Hypertension Mother   . High Cholesterol Mother     Social History Social History   Tobacco Use  . Smoking status: Never Smoker  . Smokeless tobacco: Never Used  Substance Use Topics  . Alcohol use: Never    Frequency: Never  . Drug use: Never     Allergies   Cefaclor and  Hydroxyurea   Review of Systems Review of Systems  Constitutional: Negative for chills and fever.  HENT: Negative for sore throat.   Eyes: Negative for redness.  Respiratory: Negative for cough and shortness of breath.   Cardiovascular: Positive for chest pain. Negative for palpitations and leg swelling.  Gastrointestinal: Positive for nausea. Negative for abdominal pain, constipation and diarrhea.  Genitourinary: Negative for dysuria, flank pain, hematuria, vaginal bleeding and vaginal discharge.  Musculoskeletal: Negative for back pain and neck pain.  Skin: Negative for rash.  Neurological: Negative for headaches.  Hematological: Does not bruise/bleed easily.  Psychiatric/Behavioral: Negative for confusion.     Physical Exam Updated Vital Signs BP 102/85 (BP Location: Right Arm)   Pulse 68   Temp 99.3 F (37.4 C) (Oral)   Resp 18   LMP 04/25/2019   SpO2 96%   Physical Exam Vitals signs and nursing note reviewed.  Constitutional:      Appearance: Normal appearance. She is well-developed.  HENT:     Head: Atraumatic.     Nose: Nose normal.     Mouth/Throat:     Mouth: Mucous membranes are moist.  Eyes:     General: No scleral icterus.    Conjunctiva/sclera: Conjunctivae normal.     Pupils: Pupils are equal, round, and reactive to light.  Neck:     Musculoskeletal: Normal range of motion and neck supple. No neck rigidity or muscular tenderness.     Trachea: No tracheal deviation.  Cardiovascular:     Rate and Rhythm: Normal rate and regular rhythm.     Pulses: Normal pulses.     Heart sounds: Normal heart sounds. No murmur. No friction rub. No gallop.   Pulmonary:     Effort: Pulmonary effort is normal. No respiratory distress.     Breath sounds: Normal breath sounds.     Comments: Normal chest wall movement.  Chest:     Chest wall: Tenderness present.  Abdominal:     General: Bowel sounds are normal. There is no distension.     Palpations: Abdomen is soft.      Tenderness: There is no abdominal tenderness. There is no guarding.  Genitourinary:    Comments: No cva tenderness.  Musculoskeletal:        General: No swelling or tenderness.     Right lower leg: No edema.     Left lower leg: No edema.  Skin:    General: Skin is warm and dry.     Findings: No rash.  Neurological:     Mental Status: She is alert.     Comments: Alert, speech normal. Motor/sens grossly intact bil. Steady gait.   Psychiatric:        Mood and Affect: Mood normal.  ED Treatments / Results  Labs (all labs ordered are listed, but only abnormal results are displayed) Results for orders placed or performed during the hospital encounter of 05/23/19  Comprehensive metabolic panel  Result Value Ref Range   Sodium 138 135 - 145 mmol/L   Potassium 3.4 (L) 3.5 - 5.1 mmol/L   Chloride 108 98 - 111 mmol/L   CO2 22 22 - 32 mmol/L   Glucose, Bld 92 70 - 99 mg/dL   BUN <5 (L) 6 - 20 mg/dL   Creatinine, Ser 0.39 (L) 0.44 - 1.00 mg/dL   Calcium 8.6 (L) 8.9 - 10.3 mg/dL   Total Protein 7.9 6.5 - 8.1 g/dL   Albumin 4.2 3.5 - 5.0 g/dL   AST 26 15 - 41 U/L   ALT 17 0 - 44 U/L   Alkaline Phosphatase 63 38 - 126 U/L   Total Bilirubin 4.4 (H) 0.3 - 1.2 mg/dL   GFR calc non Af Amer >60 >60 mL/min   GFR calc Af Amer >60 >60 mL/min   Anion gap 8 5 - 15  CBC with Differential  Result Value Ref Range   WBC 16.7 (H) 4.0 - 10.5 K/uL   RBC 2.56 (L) 3.87 - 5.11 MIL/uL   Hemoglobin 7.5 (L) 12.0 - 15.0 g/dL   HCT 22.9 (L) 36.0 - 46.0 %   MCV 89.5 80.0 - 100.0 fL   MCH 29.3 26.0 - 34.0 pg   MCHC 32.8 30.0 - 36.0 g/dL   RDW 19.2 (H) 11.5 - 15.5 %   Platelets 651 (H) 150 - 400 K/uL   nRBC 0.2 0.0 - 0.2 %   Neutrophils Relative % 63 %   Neutro Abs 10.8 (H) 1.7 - 7.7 K/uL   Lymphocytes Relative 23 %   Lymphs Abs 3.8 0.7 - 4.0 K/uL   Monocytes Relative 11 %   Monocytes Absolute 1.8 (H) 0.1 - 1.0 K/uL   Eosinophils Relative 1 %   Eosinophils Absolute 0.1 0.0 - 0.5 K/uL    Basophils Relative 1 %   Basophils Absolute 0.1 0.0 - 0.1 K/uL   Immature Granulocytes 1 %   Abs Immature Granulocytes 0.10 (H) 0.00 - 0.07 K/uL  Reticulocytes  Result Value Ref Range   Retic Ct Pct 10.1 (H) 0.4 - 3.1 %   RBC. 2.56 (L) 3.87 - 5.11 MIL/uL   Retic Count, Absolute 259.6 (H) 19.0 - 186.0 K/uL   Immature Retic Fract 24.6 (H) 2.3 - 15.9 %  I-Stat beta hCG blood, ED  Result Value Ref Range   I-stat hCG, quantitative <5.0 <5 mIU/mL   Comment 3          Troponin I (High Sensitivity)  Result Value Ref Range   Troponin I (High Sensitivity) <2 <18 ng/L    EKG None  Radiology Dg Chest 2 View  Result Date: 05/23/2019 CLINICAL DATA:  Chest pain, sickle cell pain since Friday EXAM: CHEST - 2 VIEW COMPARISON:  05/21/2019 FINDINGS: RIGHT jugular Port-A-Cath with tip projecting over SVC. LEFT jugular Port-A-Cath with tip projecting over cavoatrial junction. Borderline enlargement of cardiac silhouette. Mediastinal contours and pulmonary vascularity normal. Minimal chronic atelectasis or scarring at RIGHT base. Remaining lungs clear. No pleural effusion or pneumothorax. No acute osseous findings. IMPRESSION: Minimal chronic atelectasis versus scarring at RIGHT base. No acute abnormalities. Electronically Signed   By: Lavonia Dana M.D.   On: 05/23/2019 13:38   Dg Chest 2 View  Result Date: 05/21/2019 CLINICAL DATA:  Chest pain.  Sickle cell disease.  History of asthma EXAM: CHEST - 2 VIEW COMPARISON:  05/04/2019 FINDINGS: Bilateral porta caths remain in place. Mild cardiac enlargement. Chronic interstitial coarsening. No superimposed airspace consolidation. No pleural effusion or edema. IMPRESSION: No acute cardiopulmonary abnormalities. Electronically Signed   By: Kerby Moors M.D.   On: 05/21/2019 18:53    Procedures Procedures (including critical care time)  Medications Ordered in ED Medications  HYDROmorphone (DILAUDID) injection 1 mg (has no administration in time range)   ondansetron (ZOFRAN) injection 4 mg (has no administration in time range)  sodium chloride 0.9 % bolus 500 mL (has no administration in time range)     Initial Impression / Assessment and Plan / ED Course  I have reviewed the triage vital signs and the nursing notes.  Pertinent labs & imaging results that were available during my care of the patient were reviewed by me and considered in my medical decision making (see chart for details).  Iv ns 500 cc bolus. Dilaudid iv. zofran iv. Ecg. Labs sent. Cxr.   Reviewed nursing notes and prior charts for additional history.   Labs reviewed/interpreted by me - chem normal w exception k sl low. kcl po. Po fluids.   Xray reviewed/interpreted by me - no pna.   Patients pain is improved but persists. Pt requests benadryl as well. No rash - states gets itchy w pain medication. Dilaudid iv. Benadryl iv. toradol iv.   Additional labs reviewed/interpreted by me - after symptoms for past couple days, constant, trop is normal - felt not c/w acs. ecg appears c/w baseline.  On recheck pt appears comfortable and in no acute distress. Vital signs normal. Pulse ox 99% room air.   Patients pain currently controlled and appears stable for d/c.   Rec close pcp f/u.  Return precautions provided.     Final Clinical Impressions(s) / ED Diagnoses   Final diagnoses:  None    ED Discharge Orders    None         Lajean Saver, MD 05/23/19 [phone]

## 2019-05-23 NOTE — Discharge Instructions (Addendum)
It was our pleasure to provide your ER care today - we hope that you feel better.  Rest. Drink plenty of fluids. Take your pain medication as need.   From today's labs, your potassium level is slightly low - eat plenty of fruits/vegetables, and follow up with your doctor.   Follow up with primary care doctor in the next 1-2 days - call office in AM tomorrow to arrange appointment.   Return to ER if worse, new symptoms, fevers, worsening or severe pain, trouble breathing, persistent vomiting, weak/fainting, or other concern.   You were given pain medication in the ER - no driving for the next 8 hours or when taking opiate type pain medication.

## 2019-05-23 NOTE — ED Triage Notes (Signed)
Pt presents with c/o sickle cell pain. Pt reports the pain is in her chest and has been present since Friday.

## 2019-05-26 ENCOUNTER — Emergency Department (HOSPITAL_COMMUNITY): Payer: Medicare Other

## 2019-05-26 ENCOUNTER — Other Ambulatory Visit: Payer: Self-pay

## 2019-05-26 ENCOUNTER — Inpatient Hospital Stay (HOSPITAL_COMMUNITY)
Admission: EM | Admit: 2019-05-26 | Discharge: 2019-06-01 | DRG: 812 | Disposition: A | Discharge status: Home / Self Care | Payer: Medicare Other | Attending: Internal Medicine | Admitting: Internal Medicine

## 2019-05-26 ENCOUNTER — Encounter (HOSPITAL_COMMUNITY): Payer: Self-pay | Admitting: Emergency Medicine

## 2019-05-26 DIAGNOSIS — Z7901 Long term (current) use of anticoagulants: Secondary | ICD-10-CM | POA: Diagnosis not present

## 2019-05-26 DIAGNOSIS — Z79899 Other long term (current) drug therapy: Secondary | ICD-10-CM

## 2019-05-26 DIAGNOSIS — Z7951 Long term (current) use of inhaled steroids: Secondary | ICD-10-CM

## 2019-05-26 DIAGNOSIS — Z20828 Contact with and (suspected) exposure to other viral communicable diseases: Secondary | ICD-10-CM | POA: Diagnosis present

## 2019-05-26 DIAGNOSIS — Z8249 Family history of ischemic heart disease and other diseases of the circulatory system: Secondary | ICD-10-CM

## 2019-05-26 DIAGNOSIS — R7989 Other specified abnormal findings of blood chemistry: Secondary | ICD-10-CM | POA: Diagnosis present

## 2019-05-26 DIAGNOSIS — D638 Anemia in other chronic diseases classified elsewhere: Secondary | ICD-10-CM | POA: Diagnosis present

## 2019-05-26 DIAGNOSIS — R52 Pain, unspecified: Secondary | ICD-10-CM | POA: Diagnosis present

## 2019-05-26 DIAGNOSIS — D473 Essential (hemorrhagic) thrombocythemia: Secondary | ICD-10-CM | POA: Diagnosis present

## 2019-05-26 DIAGNOSIS — E876 Hypokalemia: Secondary | ICD-10-CM | POA: Diagnosis present

## 2019-05-26 DIAGNOSIS — Z79891 Long term (current) use of opiate analgesic: Secondary | ICD-10-CM | POA: Diagnosis not present

## 2019-05-26 DIAGNOSIS — D5700 Hb-SS disease with crisis, unspecified: Secondary | ICD-10-CM | POA: Diagnosis present

## 2019-05-26 DIAGNOSIS — G894 Chronic pain syndrome: Secondary | ICD-10-CM | POA: Diagnosis present

## 2019-05-26 DIAGNOSIS — Z888 Allergy status to other drugs, medicaments and biological substances status: Secondary | ICD-10-CM

## 2019-05-26 DIAGNOSIS — D72829 Elevated white blood cell count, unspecified: Secondary | ICD-10-CM | POA: Diagnosis present

## 2019-05-26 DIAGNOSIS — Z881 Allergy status to other antibiotic agents status: Secondary | ICD-10-CM

## 2019-05-26 DIAGNOSIS — Z86711 Personal history of pulmonary embolism: Secondary | ICD-10-CM | POA: Diagnosis present

## 2019-05-26 DIAGNOSIS — J4520 Mild intermittent asthma, uncomplicated: Secondary | ICD-10-CM | POA: Diagnosis present

## 2019-05-26 LAB — CBC WITH DIFFERENTIAL/PLATELET
Abs Immature Granulocytes: 0.12 10*3/uL — ABNORMAL HIGH (ref 0.00–0.07)
Basophils Absolute: 0.1 10*3/uL (ref 0.0–0.1)
Basophils Relative: 0 %
Eosinophils Absolute: 0.1 10*3/uL (ref 0.0–0.5)
Eosinophils Relative: 1 %
HCT: 23.5 % — ABNORMAL LOW (ref 36.0–46.0)
Hemoglobin: 7.9 g/dL — ABNORMAL LOW (ref 12.0–15.0)
Immature Granulocytes: 1 %
Lymphocytes Relative: 19 %
Lymphs Abs: 3.9 10*3/uL (ref 0.7–4.0)
MCH: 29.7 pg (ref 26.0–34.0)
MCHC: 33.6 g/dL (ref 30.0–36.0)
MCV: 88.3 fL (ref 80.0–100.0)
Monocytes Absolute: 2.8 10*3/uL — ABNORMAL HIGH (ref 0.1–1.0)
Monocytes Relative: 13 %
Neutro Abs: 13.7 10*3/uL — ABNORMAL HIGH (ref 1.7–7.7)
Neutrophils Relative %: 66 %
Platelets: 632 10*3/uL — ABNORMAL HIGH (ref 150–400)
RBC: 2.66 MIL/uL — ABNORMAL LOW (ref 3.87–5.11)
RDW: 18.1 % — ABNORMAL HIGH (ref 11.5–15.5)
WBC: 20.7 10*3/uL — ABNORMAL HIGH (ref 4.0–10.5)
nRBC: 0.1 % (ref 0.0–0.2)

## 2019-05-26 LAB — RETICULOCYTES
Immature Retic Fract: 16.9 % — ABNORMAL HIGH (ref 2.3–15.9)
RBC.: 2.66 MIL/uL — ABNORMAL LOW (ref 3.87–5.11)
Retic Count, Absolute: 212.8 10*3/uL — ABNORMAL HIGH (ref 19.0–186.0)
Retic Ct Pct: 8 % — ABNORMAL HIGH (ref 0.4–3.1)

## 2019-05-26 LAB — COMPREHENSIVE METABOLIC PANEL
ALT: 19 U/L (ref 0–44)
AST: 28 U/L (ref 15–41)
Albumin: 4.8 g/dL (ref 3.5–5.0)
Alkaline Phosphatase: 62 U/L (ref 38–126)
Anion gap: 10 (ref 5–15)
BUN: 9 mg/dL (ref 6–20)
CO2: 21 mmol/L — ABNORMAL LOW (ref 22–32)
Calcium: 9.3 mg/dL (ref 8.9–10.3)
Chloride: 106 mmol/L (ref 98–111)
Creatinine, Ser: 0.47 mg/dL (ref 0.44–1.00)
GFR calc Af Amer: >60 mL/min (ref >60)
GFR calc non Af Amer: >60 mL/min (ref >60)
Glucose, Bld: 105 mg/dL — ABNORMAL HIGH (ref 70–99)
Potassium: 3.3 mmol/L — ABNORMAL LOW (ref 3.5–5.1)
Sodium: 137 mmol/L (ref 135–145)
Total Bilirubin: 5.3 mg/dL — ABNORMAL HIGH (ref 0.3–1.2)
Total Protein: 8.7 g/dL — ABNORMAL HIGH (ref 6.5–8.1)

## 2019-05-26 LAB — TROPONIN I (HIGH SENSITIVITY)
Troponin I (High Sensitivity): <2 ng/L (ref <18)
Troponin I (High Sensitivity): <2 ng/L (ref <18)

## 2019-05-26 LAB — I-STAT BETA HCG BLOOD, ED (MC, WL, AP ONLY): I-stat hCG, quantitative: <5 m[IU]/mL (ref <5)

## 2019-05-26 LAB — SARS CORONAVIRUS 2 (TAT 6-24 HRS): SARS Coronavirus 2: NEGATIVE

## 2019-05-26 MED ORDER — MIRTAZAPINE 30 MG PO TABS
45.0000 mg | ORAL_TABLET | Freq: Every day | ORAL | Status: DC
  Administered 2019-05-26 – 2019-05-31 (×6): 45 mg via ORAL
  Filled 2019-05-26 (×6): qty 1

## 2019-05-26 MED ORDER — SENNOSIDES-DOCUSATE SODIUM 8.6-50 MG PO TABS
1.0000 | ORAL_TABLET | Freq: Two times a day (BID) | ORAL | Status: DC
  Administered 2019-05-26 – 2019-06-01 (×12): 1 via ORAL
  Filled 2019-05-26 (×12): qty 1

## 2019-05-26 MED ORDER — DEFERASIROX 360 MG PO TABS: 1080.0000 mg | ORAL_TABLET | Freq: Every day | ORAL | Status: DC

## 2019-05-26 MED ORDER — POLYETHYLENE GLYCOL 3350 17 G PO PACK: 17.0000 g | PACK | Freq: Every day | ORAL | Status: DC | PRN

## 2019-05-26 MED ORDER — KETOROLAC TROMETHAMINE 30 MG/ML IJ SOLN
30.0000 mg | Freq: Once | INTRAMUSCULAR | Status: AC
  Administered 2019-05-26: 09:00:00 30 mg via INTRAVENOUS
  Filled 2019-05-26: qty 1

## 2019-05-26 MED ORDER — DIPHENHYDRAMINE HCL 50 MG/ML IJ SOLN
25.0000 mg | Freq: Once | INTRAMUSCULAR | Status: AC
  Administered 2019-05-26: 25 mg via INTRAVENOUS
  Filled 2019-05-26: qty 1

## 2019-05-26 MED ORDER — VOXELOTOR 500 MG PO TABS: 1500.0000 mg | ORAL_TABLET | Freq: Every day | ORAL | Status: DC

## 2019-05-26 MED ORDER — HYDROMORPHONE HCL 2 MG/ML IJ SOLN
2.0000 mg | Freq: Once | INTRAMUSCULAR | Status: AC
  Administered 2019-05-26: 2 mg via INTRAVENOUS
  Filled 2019-05-26: qty 1

## 2019-05-26 MED ORDER — DEXTROSE-NACL 5-0.45 % IV SOLN
INTRAVENOUS | Status: DC
  Administered 2019-05-26 (×2): via INTRAVENOUS

## 2019-05-26 MED ORDER — PANTOPRAZOLE SODIUM 40 MG PO TBEC
40.0000 mg | DELAYED_RELEASE_TABLET | Freq: Every day | ORAL | Status: DC
  Administered 2019-05-26 – 2019-06-01 (×7): 40 mg via ORAL
  Filled 2019-05-26 (×6): qty 1

## 2019-05-26 MED ORDER — NALOXONE HCL 0.4 MG/ML IJ SOLN: 0.4000 mg | INTRAMUSCULAR | Status: DC | PRN

## 2019-05-26 MED ORDER — POTASSIUM CHLORIDE CRYS ER 20 MEQ PO TBCR
20.0000 meq | EXTENDED_RELEASE_TABLET | Freq: Every day | ORAL | Status: DC
  Administered 2019-05-26 – 2019-06-01 (×7): 20 meq via ORAL
  Filled 2019-05-26 (×7): qty 1

## 2019-05-26 MED ORDER — SODIUM CHLORIDE 0.9% FLUSH: 9.0000 mL | INTRAVENOUS | Status: DC | PRN

## 2019-05-26 MED ORDER — VITAMIN B-12 1000 MCG PO TABS
1000.0000 ug | ORAL_TABLET | Freq: Every day | ORAL | Status: DC
  Administered 2019-05-26 – 2019-06-01 (×7): 1000 ug via ORAL
  Filled 2019-05-26 (×7): qty 1

## 2019-05-26 MED ORDER — FOLIC ACID 1 MG PO TABS
1.0000 mg | ORAL_TABLET | Freq: Every day | ORAL | Status: DC
  Administered 2019-05-26 – 2019-06-01 (×7): 1 mg via ORAL
  Filled 2019-05-26 (×7): qty 1

## 2019-05-26 MED ORDER — HYDROMORPHONE 1 MG/ML IV SOLN
INTRAVENOUS | Status: DC
  Administered 2019-05-26: 30 mg via INTRAVENOUS
  Administered 2019-05-26: 2.5 mg via INTRAVENOUS
  Administered 2019-05-27: 4 mg via INTRAVENOUS
  Administered 2019-05-27: 1 mg via INTRAVENOUS
  Administered 2019-05-27 (×2): 1.5 mg via INTRAVENOUS
  Administered 2019-05-27: 1 mg via INTRAVENOUS
  Administered 2019-05-28: 3 mg via INTRAVENOUS
  Administered 2019-05-28: 2.5 mg via INTRAVENOUS
  Administered 2019-05-28: 30 mg via INTRAVENOUS
  Administered 2019-05-28 (×2): 1.5 mg via INTRAVENOUS
  Administered 2019-05-28: 3 mg via INTRAVENOUS
  Administered 2019-05-29: 3.7 mg via INTRAVENOUS
  Administered 2019-05-29: 4 mg via INTRAVENOUS
  Administered 2019-05-29: 3.09 mg via INTRAVENOUS
  Filled 2019-05-26 (×3): qty 30

## 2019-05-26 MED ORDER — SODIUM CHLORIDE 0.9% FLUSH
3.0000 mL | Freq: Once | INTRAVENOUS | Status: AC
  Administered 2019-05-26: 3 mL via INTRAVENOUS

## 2019-05-26 MED ORDER — RIVAROXABAN 20 MG PO TABS
20.0000 mg | ORAL_TABLET | Freq: Every day | ORAL | Status: DC
  Administered 2019-05-26 – 2019-06-01 (×7): 20 mg via ORAL
  Filled 2019-05-26 (×7): qty 1

## 2019-05-26 MED ORDER — KETOROLAC TROMETHAMINE 15 MG/ML IJ SOLN
15.0000 mg | Freq: Four times a day (QID) | INTRAMUSCULAR | Status: AC
  Administered 2019-05-26 – 2019-05-31 (×20): 15 mg via INTRAVENOUS
  Filled 2019-05-26 (×19): qty 1

## 2019-05-26 MED ORDER — ONDANSETRON HCL 4 MG/2ML IJ SOLN: 4.0000 mg | Freq: Four times a day (QID) | INTRAMUSCULAR | Status: DC | PRN

## 2019-05-26 MED ORDER — ALBUTEROL SULFATE (2.5 MG/3ML) 0.083% IN NEBU: 2.5000 mg | INHALATION_SOLUTION | Freq: Two times a day (BID) | RESPIRATORY_TRACT | Status: DC | PRN

## 2019-05-26 MED ORDER — MOMETASONE FURO-FORMOTEROL FUM 100-5 MCG/ACT IN AERO: 2.0000 | INHALATION_SPRAY | Freq: Every day | RESPIRATORY_TRACT | Status: DC | PRN

## 2019-05-26 MED ORDER — DIPHENHYDRAMINE HCL 25 MG PO CAPS: 25.0000 mg | ORAL_CAPSULE | ORAL | Status: DC | PRN

## 2019-05-26 NOTE — ED Notes (Signed)
Patient transported to X-ray 

## 2019-05-26 NOTE — H&P (Addendum)
H&P  Patient Demographics:  Molly Gutierrez, is a 38 y.o. female  MRN: [id number]   DOB - October 08, 1980  Admit Date - 05/26/2019  Outpatient Primary MD for the patient is System, Provider Not In  Chief Complaint  Patient presents with  . Sickle Cell Pain Crisis      HPI:   Molly Gutierrez  is a 38 y.o. female with a medical history significant for sickle cell disease, chronic pain syndrome, history of asthma, history of eczema, opiate tolerance and dependence, history of anemia of chronic disease, and history of PE on Xarelto presents complaining of pain primarily to mid chest and upper back.  Patient says that pain is been increasing over the past 4 days.  She attributes current pain crisis to increased stress and changes in weather.  She says that pain is been uncontrolled on home Dilaudid.  She says that pain is similar to her typical pain crisis.  She denies fevers, shortness of breath, or persistent cough.  She says that doses of IV Dilaudid in the ER have improved pain some, but she continues to have 8/10 pain intensity despite medications.  ER course: Vital signs are reassuring, but blood pressures are fairly soft following IV Dilaudid.  Patient is afebrile.  Hemoglobin is 7.9, which is consistent with patient's baseline.  WBCs 20.7.  Chest x-ray shows no acute cardiopulmonary process.  COVID-19 test pending.  Patient's pain persists despite several doses of IV Dilaudid, Toradol, and IV fluids.  Patient admitted to [facility] for management of sickle cell pain crisis.   Review of systems:  In addition to the HPI above, patient reports Review of Systems  Constitutional: Negative for chills and fever.  HENT: Negative.   Eyes: Negative.   Respiratory: Negative.   Cardiovascular: Negative.  Negative for chest pain.  Gastrointestinal: Negative.   Genitourinary: Negative.   Musculoskeletal: Positive for back pain and joint pain.  Neurological: Negative.  Negative for dizziness and  headaches.  Endo/Heme/Allergies: Negative.   Psychiatric/Behavioral: Negative.  Negative for depression and suicidal ideas.   A full 10 point Review of Systems was done, except as stated above, all other Review of Systems were negative.  With Past History of the following :   Past Medical History:  Diagnosis Date  . Asthma   . Eczema   . Sickle cell anemia (HCC)       History reviewed. No pertinent surgical history.   Social History:   Social History   Tobacco Use  . Smoking status: Never Smoker  . Smokeless tobacco: Never Used  Substance Use Topics  . Alcohol use: Never    Frequency: Never     Lives - At home   Family History :   Family History  Problem Relation Age of Onset  . Renal Disease Mother   . Hypertension Mother   . High Cholesterol Mother      Home Medications:   Prior to Admission medications   Medication Sig Start Date End Date Taking? Authorizing Provider  albuterol (VENTOLIN HFA) 108 (90 Base) MCG/ACT inhaler Inhale 2 puffs into the lungs 2 (two) times daily as needed for wheezing or shortness of breath. 12/31/16  Yes [provider]  Cholecalciferol (VITAMIN D3) 25 MCG (1000 UT) CAPS Take 1,000 Units by mouth daily.   Yes [provider]  Deferasirox 360 MG TABS Take 1,080 mg by mouth daily. 09/27/16  Yes [provider]  diphenhydrAMINE (BENADRYL) 25 mg capsule Take 25 mg by mouth 3 (three)  times daily as needed for itching.   Yes [provider]  folic acid (FOLVITE) 1 MG tablet Take 1 mg by mouth daily. 12/31/16  Yes [provider]  HYDROmorphone (DILAUDID) 4 MG tablet Take 1 tablet (4 mg total) by mouth every 6 (six) hours as needed for severe pain. 05/14/19  Yes [name], MD  mirtazapine (REMERON) 45 MG tablet Take 45 mg by mouth at bedtime. 09/30/18  Yes [provider]  mometasone-formoterol (DULERA) 100-5 MCG/ACT AERO Inhale 2 puffs into the lungs daily as needed for wheezing or  shortness of breath.   Yes [provider]  omeprazole (PRILOSEC) 20 MG capsule Take 20 mg by mouth 2 (two) times a day. 09/08/18  Yes [provider]  promethazine (PHENERGAN) 25 MG tablet Take 25 mg by mouth every 6 (six) hours as needed for nausea/vomiting.   Yes [provider]  vitamin B-12 (CYANOCOBALAMIN) 1000 MCG tablet Take 1,000 mcg by mouth daily.   Yes [provider]  voxelotor (OXBRYTA) 500 MG TABS tablet Take 1,500 mg by mouth daily.   Yes [provider]  XARELTO 20 MG TABS tablet Take 1 tablet (20 mg total) by mouth daily. 05/14/19  Yes [name], MD     Allergies:   Allergies  Allergen Reactions  . Cefaclor Hives and Swelling  . Hydroxyurea Other (See Comments) and Palpitations    Lower blood levels and HR Other reaction(s): Hypotension, Other (See Comments) "it messes me up, it drops my levels and stuff" Lower blood levels and HR "it messes me up, it drops my levels and stuff"      Physical Exam:   Vitals:   Vitals:   05/26/19 1330 05/26/19 1400  BP: 104/74 94/70  Pulse: 85 82  Resp: 18 17  Temp:    SpO2: 97% 97%    Physical Exam: Constitutional: Patient appears well-developed and well-nourished. Not in obvious distress. HENT: Normocephalic, atraumatic, External right and left ear normal. Oropharynx is clear and moist.  Eyes: Conjunctivae and EOM are normal. PERRLA, no scleral icterus. Neck: Normal ROM. Neck supple. No JVD. No tracheal deviation. No thyromegaly. CVS: RRR, S1/S2 +, no murmurs, no gallops, no carotid bruit.  Pulmonary: Effort and breath sounds normal, no stridor, rhonchi, wheezes, rales.  Abdominal: Soft. BS +, no distension, tenderness, rebound or guarding.  Musculoskeletal: Normal range of motion. No edema and no tenderness.  Lymphadenopathy: No lymphadenopathy noted, cervical, inguinal or axillary Neuro: Alert. Normal reflexes, muscle tone coordination. No cranial nerve  deficit. Skin: Skin is warm and dry. No rash noted. Not diaphoretic. No erythema. No pallor. Psychiatric: Normal mood and affect. Behavior, judgment, thought content normal.   Data Review:   CBC Recent Labs  Lab 05/21/19 1938 05/23/19 1234 05/26/19 0806  WBC 16.8* 16.7* 20.7*  HGB 8.1* 7.5* 7.9*  HCT 25.0* 22.9* 23.5*  PLT 646* 651* 632*  MCV 89.0 89.5 88.3  MCH 28.8 29.3 29.7  MCHC 32.4 32.8 33.6  RDW 18.4* 19.2* 18.1*  LYMPHSABS  --  3.8 3.9  MONOABS  --  1.8* 2.8*  EOSABS  --  0.1 0.1  BASOSABS  --  0.1 0.1   ------------------------------------------------------------------------------------------------------------------  Chemistries  Recent Labs  Lab 05/21/19 1938 05/23/19 1234 05/26/19 0806  NA 137 138 137  K 3.8 3.4* 3.3*  CL 106 108 106  CO2 21* 22 21*  GLUCOSE 98 92 105*  BUN 8 <5* 9  CREATININE 0.46 0.39* 0.47  CALCIUM 9.1  8.6* 9.3  AST 29 26 28   ALT 19 17 19   ALKPHOS 71 63 62  BILITOT 3.8* 4.4* 5.3*   ------------------------------------------------------------------------------------------------------------------ estimated creatinine clearance is 79.6 mL/min (by C-G formula based on SCr of 0.47 mg/dL). ------------------------------------------------------------------------------------------------------------------ No results for input(s): TSH, T4TOTAL, T3FREE, THYROIDAB in the last 72 hours.  Invalid input(s): FREET3  Coagulation profile No results for input(s): INR, PROTIME in the last 168 hours. ------------------------------------------------------------------------------------------------------------------- No results for input(s): DDIMER in the last 72 hours. -------------------------------------------------------------------------------------------------------------------  Cardiac Enzymes No results for input(s): CKMB, TROPONINI, MYOGLOBIN in the last 168 hours.  Invalid input(s):  CK ------------------------------------------------------------------------------------------------------------------    Component Value Date/Time   BNP 70.6 05/05/2019 0204    ---------------------------------------------------------------------------------------------------------------  Urinalysis No results found for: COLORURINE, APPEARANCEUR, LABSPEC, PHURINE, GLUCOSEU, HGBUR, BILIRUBINUR, KETONESUR, PROTEINUR, UROBILINOGEN, NITRITE, LEUKOCYTESUR  ----------------------------------------------------------------------------------------------------------------   Imaging Results:    Dg Chest 2 View  Result Date: 05/26/2019 CLINICAL DATA:  Sickle cell pain crisis EXAM: CHEST - 2 VIEW COMPARISON:  Three days ago FINDINGS: Bilateral porta catheter with tips in good position in the superior cavoatrial junction. Chronic mild interstitial coarsening. There is no edema, consolidation, effusion, or pneumothorax. Normal heart size and mediastinal contours. Cholecystectomy. IMPRESSION: Stable chest.  No evidence of active disease. Electronically Signed   By: Monte Fantasia M.D.   On: 05/26/2019 09:06     Assessment & Plan:  Principal Problem:   Sickle cell pain crisis ([place]) Active Problems:   Leukocytosis   Chronic pain syndrome   History of pulmonary embolism   Hypokalemia   Thrombocytosis (HCC)  Sickle cell disease with pain crisis: Admit, continue D5 0.45% saline at 125 mL/h.  Initiate weightbase Dilaudid PCA with settings of 0.5 mg, 10-minute lockout, and 3 mg/h. IV Toradol 15 mg every 6 hours for total of 5 days.  Monitor vital signs closely.  Incentive spirometer. Reevaluate pain scale regularly.  Supplemental oxygen as needed.  Patient will be reevaluated for pain in the context of functioning and relationship to baseline as care progresses. Continue home medications including Deferasirox, voxelotor, folic acisd, and vitamin B12  Chronic pain syndrome: Hold Dilaudid by mouth, use  PCA as substitute.  Sickle cell anemia: Hemoglobin 7.9, consistent with patient's baseline which is 7.0-8.0 g/dL.  No clinical indication for blood transfusion at this time.  Continue to monitor closely.  Repeat CBC in a.m.  History of PE: Continue Xarelto 20 mg daily  History of mild intermittent asthma: Continue Dulera and albuterol every 6 hours as needed for wheezing, persistent cough, and/or shortness of breath.  Leukocytosis: WBCs 20.6.  Patient afebrile.  No signs of infection or inflammation.  Suspected to be reactive.  Chest x-ray shows no acute cardiopulmonary process.  Urine culture and urinalysis pending.  We will continue to monitor without antibiotics.  Repeat CBC in a.m.  Hypokalemia: Replete.  Repeat BMP in a.m.   DVT Prophylaxis: Xarelto and SCDs  AM Labs Ordered, also please review Full Orders  Family Communication: Admission, patient's condition and plan of care including tests being ordered have been discussed with the patient who indicate understanding and agree with the plan and Code Status.  Code Status: Full Code  Consults called: None    Admission status: Inpatient    Time spent in minutes : 50 minutes  [place], MSN, FNP-C [facility] [address] [place], [place] [address] [phone]  05/26/2019 at 3:07 PM

## 2019-05-26 NOTE — Progress Notes (Signed)
This RN attempted to call for report and no one answered after being on hold. Will wait for report to be called.

## 2019-05-26 NOTE — ED Notes (Signed)
Attempted to get labs in triage but unable to obtain labs. Pt prefers port to be accessed for labs and medications.

## 2019-05-26 NOTE — ED Provider Notes (Signed)
Emergency Department Provider Note   I have reviewed the triage vital signs and the nursing notes.   HISTORY  Chief Complaint Sickle Cell Pain Crisis   HPI Molly Gutierrez is a 38 y.o. female with PMH of SS disease presents to the emergency department for evaluation of chest and mid back pain over the last 7 days.  Patient denies any shortness of breath, fever, cough.  She is been taking her home medications without relief.  She is been to the emergency department recently with similar symptoms which initially improved but have since worsened again. Denies pleuritic pain.  Pain is worse with movement.  No other modifying factors.  Past Medical History:  Diagnosis Date  . Asthma   . Eczema   . Sickle cell anemia [facility])     Patient Active Problem List   Diagnosis Date Noted  . Hypokalemia 05/05/2019  . Thrombocytosis ([place]) 05/05/2019  . Breast mass in female   . Sickle cell pain crisis ([place]) 04/19/2019  . Leukocytosis 04/19/2019  . Chronic pain syndrome 04/19/2019  . History of pulmonary embolism 04/19/2019    History reviewed. No pertinent surgical history.  Allergies Cefaclor and Hydroxyurea  Family History  Problem Relation Age of Onset  . Renal Disease Mother   . Hypertension Mother   . High Cholesterol Mother     Social History Social History   Tobacco Use  . Smoking status: Never Smoker  . Smokeless tobacco: Never Used  Substance Use Topics  . Alcohol use: Never    Frequency: Never  . Drug use: Never    Review of Systems  Constitutional: No fever/chills Eyes: No visual changes. ENT: No sore throat. Cardiovascular: Positive chest pain. Respiratory: Denies shortness of breath. Gastrointestinal: No abdominal pain.  No nausea, no vomiting.  No diarrhea.  No constipation. Genitourinary: Negative for dysuria. Musculoskeletal: Negative for back pain. Positive total body pain.  Skin: Negative for rash. Neurological: Negative for headaches, focal  weakness or numbness.  10-point ROS otherwise negative.  ____________________________________________   PHYSICAL EXAM:  VITAL SIGNS: ED Triage Vitals  Enc Vitals Group     BP 05/26/19 0433 102/74     Pulse Rate 05/26/19 0433 99     Resp 05/26/19 0433 19     Temp 05/26/19 0433 99 F (37.2 C)     Temp Source 05/26/19 0433 Oral     SpO2 05/26/19 0433 99 %     Weight 05/26/19 0832 120 lb 13 oz (54.8 kg)     Height 05/26/19 0832 5\' 3"  (1.6 m)   Constitutional: Alert and oriented. Well appearing and in no acute distress. Eyes: Conjunctivae are normal.  Head: Atraumatic. Nose: No congestion/rhinnorhea. Mouth/Throat: Mucous membranes are moist.  Neck: No stridor.   Cardiovascular: Normal rate, regular rhythm. Good peripheral circulation. Grossly normal heart sounds.   Respiratory: Normal respiratory effort.  No retractions. Lungs CTAB. Gastrointestinal: Soft and nontender. No distention.  Musculoskeletal: No lower extremity tenderness nor edema. No gross deformities of extremities. Neurologic:  Normal speech and language. No gross focal neurologic deficits are appreciated.  Skin:  Skin is warm, dry and intact. No rash noted.  ____________________________________________   LABS (all labs ordered are listed, but only abnormal results are displayed)  Labs Reviewed  COMPREHENSIVE METABOLIC PANEL - Abnormal; Notable for the following components:      Result Value   Potassium 3.3 (*)    CO2 21 (*)    Glucose, Bld 105 (*)    Total Protein  8.7 (*)    Total Bilirubin 5.3 (*)    All other components within normal limits  CBC WITH DIFFERENTIAL/PLATELET - Abnormal; Notable for the following components:   WBC 20.7 (*)    RBC 2.66 (*)    Hemoglobin 7.9 (*)    HCT 23.5 (*)    RDW 18.1 (*)    Platelets 632 (*)    Neutro Abs 13.7 (*)    Monocytes Absolute 2.8 (*)    Abs Immature Granulocytes 0.12 (*)    All other components within normal limits  RETICULOCYTES - Abnormal; Notable  for the following components:   Retic Ct Pct 8.0 (*)    RBC. 2.66 (*)    Retic Count, Absolute 212.8 (*)    Immature Retic Fract 16.9 (*)    All other components within normal limits  SARS CORONAVIRUS 2 (TAT 6-24 HRS)  URINE CULTURE  URINALYSIS, ROUTINE W REFLEX MICROSCOPIC  CBC  I-STAT BETA HCG BLOOD, ED ([facility], [facility], [facility] ONLY)  TROPONIN I (HIGH SENSITIVITY)  TROPONIN I (HIGH SENSITIVITY)   ____________________________________________  EKG   EKG Interpretation  Date/Time:  Wednesday May 26 2019 04:47:34 EST Ventricular Rate:  102 PR Interval:    QRS Duration: 95 QT Interval:  379 QTC Calculation: 494 R Axis:   70 Text Interpretation: Sinus tachycardia Borderline T abnormalities, anterior leads Borderline prolonged QT interval Baseline wander in lead(s) I II aVR aVL No STEMI Confirmed by Nanda Quinton [phone]) on 05/26/2019 8:45:23 AM       ____________________________________________  RADIOLOGY  Dg Chest 2 View  Result Date: 05/26/2019 CLINICAL DATA:  Sickle cell pain crisis EXAM: CHEST - 2 VIEW COMPARISON:  Three days ago FINDINGS: Bilateral porta catheter with tips in good position in the superior cavoatrial junction. Chronic mild interstitial coarsening. There is no edema, consolidation, effusion, or pneumothorax. Normal heart size and mediastinal contours. Cholecystectomy. IMPRESSION: Stable chest.  No evidence of active disease. Electronically Signed   By: Monte Fantasia M.D.   On: 05/26/2019 09:06    ____________________________________________   PROCEDURES  Procedure(s) performed:   Procedures  CRITICAL CARE Performed by: Margette Fast Total critical care time: 35 minutes Critical care time was exclusive of separately billable procedures and treating other patients. Critical care was necessary to treat or prevent imminent or life-threatening deterioration. Critical care was time spent personally by me on the following activities: development of treatment  plan with patient and/or surrogate as well as nursing, discussions with consultants, evaluation of patient's response to treatment, examination of patient, obtaining history from patient or surrogate, ordering and performing treatments and interventions, ordering and review of laboratory studies, ordering and review of radiographic studies, pulse oximetry and re-evaluation of patient's condition.  Nanda Quinton, MD Emergency Medicine  ____________________________________________   INITIAL IMPRESSION / ASSESSMENT AND PLAN / ED COURSE  Pertinent labs & imaging results that were available during my care of the patient were reviewed by me and considered in my medical decision making (see chart for details).   Patient presents to the emergency department for evaluation of chest pain with total body discomfort typical of her sickle cell crisis pain.  She is not hypoxic or tachycardic.  My suspicion for PE or acute chest are very low.  She has no ischemic changes on her EKG.  She does have a leukocytosis now up to 20 but in review of the past values she is typically in the high teens. Afebrile here. CXR pending. Will reassess after meds.  Spoke with [place] Hollis, NP. Day Center is full. Will continue pain mgmt in the ED for now. Patient can be seen at [place] tomorrow in the infusion clinic.   Patient pain not well controlled in the ED. Labs and imaging reviewed.   Discussed patient's case with SS team to request admission. Patient and family (if present) updated with plan. Care transferred to Sickle cell service.  I reviewed all nursing notes, vitals, pertinent old records, EKGs, labs, imaging (as available).  ____________________________________________  FINAL CLINICAL IMPRESSION(S) / ED DIAGNOSES  Final diagnoses:  Sickle cell pain crisis ([employer])     MEDICATIONS GIVEN DURING THIS VISIT:  Medications  dextrose 5 %-0.45 % sodium chloride infusion ( Intravenous Rate/Dose Verify 05/26/19 1818)   mirtazapine (REMERON) tablet 45 mg (has no administration in time range)  pantoprazole (PROTONIX) EC tablet 40 mg (has no administration in time range)  folic acid (FOLVITE) tablet 1 mg (has no administration in time range)  vitamin B-12 (CYANOCOBALAMIN) tablet 1,000 mcg (has no administration in time range)  voxelotor (OXBRYTA) tablet 1,500 mg (has no administration in time range)  rivaroxaban (XARELTO) tablet 20 mg (20 mg Oral Given 05/26/19 1743)  Deferasirox TABS 1,080 mg (has no administration in time range)  albuterol (PROVENTIL) (2.5 MG/3ML) 0.083% nebulizer solution 2.5 mg (has no administration in time range)  mometasone-formoterol (DULERA) 100-5 MCG/ACT inhaler 2 puff (has no administration in time range)  senna-docusate (Senokot-S) tablet 1 tablet (has no administration in time range)  polyethylene glycol (MIRALAX / GLYCOLAX) packet 17 g (has no administration in time range)  ketorolac (TORADOL) 15 MG/ML injection 15 mg (15 mg Intravenous Given 05/26/19 1743)  naloxone (NARCAN) injection 0.4 mg (has no administration in time range)    And  sodium chloride flush (NS) 0.9 % injection 9 mL (has no administration in time range)  ondansetron (ZOFRAN) injection 4 mg (has no administration in time range)  diphenhydrAMINE (BENADRYL) capsule 25 mg (has no administration in time range)  HYDROmorphone (DILAUDID) 1 mg/mL PCA injection (30 mg Intravenous Set-up / Initial Syringe 05/26/19 1752)  potassium chloride SA (KLOR-CON) CR tablet 20 mEq (20 mEq Oral Given 05/26/19 1743)  sodium chloride flush (NS) 0.9 % injection 3 mL (3 mLs Intravenous Given 05/26/19 0835)  HYDROmorphone (DILAUDID) injection 2 mg (2 mg Intravenous Given 05/26/19 0902)  ketorolac (TORADOL) 30 MG/ML injection 30 mg (30 mg Intravenous Given 05/26/19 0902)  HYDROmorphone (DILAUDID) injection 2 mg (2 mg Intravenous Given 05/26/19 1008)  diphenhydrAMINE (BENADRYL) injection 25 mg (25 mg Intravenous Given 05/26/19 1009)  HYDROmorphone  (DILAUDID) injection 2 mg (2 mg Intravenous Given 05/26/19 1400)     Note:  This document was prepared using Dragon voice recognition software and may include unintentional dictation errors.  Nanda Quinton, MD, [facility] Emergency Medicine    Long, [facility] [employer], MD 05/26/19 1945

## 2019-05-26 NOTE — ED Triage Notes (Signed)
Patient here from home with complaints of sickle cell pain crisis. Pain increased in chest and back. Home meds with no relief.

## 2019-05-26 NOTE — ED Notes (Signed)
Patient ambulated to restroom with no assist to provide UA.

## 2019-05-27 DIAGNOSIS — D473 Essential (hemorrhagic) thrombocythemia: Secondary | ICD-10-CM

## 2019-05-27 LAB — CBC
HCT: 18.7 % — ABNORMAL LOW (ref 36.0–46.0)
Hemoglobin: 6.3 g/dL — CL (ref 12.0–15.0)
MCH: 30 pg (ref 26.0–34.0)
MCHC: 33.7 g/dL (ref 30.0–36.0)
MCV: 89 fL (ref 80.0–100.0)
Platelets: 548 10*3/uL — ABNORMAL HIGH (ref 150–400)
RBC: 2.1 MIL/uL — ABNORMAL LOW (ref 3.87–5.11)
RDW: 18.3 % — ABNORMAL HIGH (ref 11.5–15.5)
WBC: 17 10*3/uL — ABNORMAL HIGH (ref 4.0–10.5)
nRBC: 0.2 % (ref 0.0–0.2)

## 2019-05-27 LAB — URINALYSIS, ROUTINE W REFLEX MICROSCOPIC
Bilirubin Urine: NEGATIVE
Glucose, UA: NEGATIVE mg/dL
Ketones, ur: NEGATIVE mg/dL
Leukocytes,Ua: NEGATIVE
Nitrite: NEGATIVE
Protein, ur: NEGATIVE mg/dL
Specific Gravity, Urine: 1.009 (ref 1.005–1.030)
pH: 5 (ref 5.0–8.0)

## 2019-05-27 LAB — PREPARE RBC (CROSSMATCH)

## 2019-05-27 MED ORDER — SODIUM CHLORIDE 0.9% IV SOLUTION
Freq: Once | INTRAVENOUS | Status: AC
  Administered 2019-05-28: 01:00:00 via INTRAVENOUS

## 2019-05-27 MED ORDER — CHLORHEXIDINE GLUCONATE CLOTH 2 % EX PADS
6.0000 | MEDICATED_PAD | Freq: Every day | CUTANEOUS | Status: DC
  Administered 2019-05-27 – 2019-06-01 (×6): 6 via TOPICAL

## 2019-05-27 MED ORDER — ACETAMINOPHEN 325 MG PO TABS
650.0000 mg | ORAL_TABLET | Freq: Once | ORAL | Status: AC
  Administered 2019-05-28: 650 mg via ORAL
  Filled 2019-05-27: qty 2

## 2019-05-27 MED ORDER — DIPHENHYDRAMINE HCL 50 MG/ML IJ SOLN
25.0000 mg | Freq: Once | INTRAMUSCULAR | Status: AC
  Administered 2019-05-28: 25 mg via INTRAVENOUS
  Filled 2019-05-27: qty 1

## 2019-05-27 NOTE — Progress Notes (Signed)
Blood bank called to report unit of blood ready to be transfused

## 2019-05-27 NOTE — Progress Notes (Signed)
Subjective: [facility], a 38 year old female with a medical history significant for sickle cell disease, chronic pain syndrome, history of asthma, history of eczema, opiate dependence and tolerance, history of anemia of chronic disease, and history of PE on Xarelto was admitted for sickle cell pain crisis. Today, patient's hemoglobin is 6.3, which is below baseline of 7.0-8.0 g/dL. Patient endorses fatigue.  She denies headache, dizziness, paresthesias, persistent cough, urinary symptoms, nausea, vomiting, or diarrhea. Patient continues to complain of pain primarily to mid chest, low back, and lower extremities.  Pain intensity is 8/10 characterized as constant and throbbing.   Objective:  Vital signs in last 24 hours:  Vitals:   05/27/19 1456 05/27/19 1812 05/27/19 1850 05/27/19 1936  BP: 108/74  105/75   Pulse: (!) 101  97   Resp: 14 11 13 13   Temp: 99.5 F (37.5 C)  99.7 F (37.6 C)   TempSrc: Oral  Oral   SpO2: 95% 98% 95% 96%  Weight:      Height:        Intake/Output from previous day:   Intake/Output Summary (Last 24 hours) at 05/27/2019 2041 Last data filed at 05/27/2019 1456 Gross per 24 hour  Intake 1531.16 ml  Output 1 ml  Net 1530.16 ml    Physical Exam: General: Alert, awake, oriented x3, in no acute distress.  HEENT: [place]/AT PEERL, EOMI Neck: Trachea midline,  no masses, no thyromegal,y no JVD, no carotid bruit OROPHARYNX:  Moist, No exudate/ erythema/lesions.  Heart: Regular rate and rhythm, without murmurs, rubs, gallops, PMI non-displaced, no heaves or thrills on palpation.  Lungs: Clear to auscultation, no wheezing or rhonchi noted. No increased vocal fremitus resonant to percussion  Abdomen: Soft, nontender, nondistended, positive bowel sounds, no masses no hepatosplenomegaly noted..  Neuro: No focal neurological deficits noted cranial nerves II through XII grossly intact. DTRs 2+ bilaterally upper and lower extremities. Strength 5 out of 5 in  bilateral upper and lower extremities. Musculoskeletal: No warm swelling or erythema around joints, no spinal tenderness noted. Psychiatric: Patient alert and oriented x3, good insight and cognition, good recent to remote recall. Lymph node survey: No cervical axillary or inguinal lymphadenopathy noted.  Lab Results:  Basic Metabolic Panel:    Component Value Date/Time   NA 137 05/26/2019 0806   K 3.3 (L) 05/26/2019 0806   CL 106 05/26/2019 0806   CO2 21 (L) 05/26/2019 0806   BUN 9 05/26/2019 0806   CREATININE 0.47 05/26/2019 0806   GLUCOSE 105 (H) 05/26/2019 0806   CALCIUM 9.3 05/26/2019 0806   CBC:    Component Value Date/Time   WBC 17.0 (H) 05/27/2019 0648   HGB 6.3 (LL) 05/27/2019 0648   HCT 18.7 (L) 05/27/2019 0648   PLT 548 (H) 05/27/2019 0648   MCV 89.0 05/27/2019 0648   NEUTROABS 13.7 (H) 05/26/2019 0806   LYMPHSABS 3.9 05/26/2019 0806   MONOABS 2.8 (H) 05/26/2019 0806   EOSABS 0.1 05/26/2019 0806   BASOSABS 0.1 05/26/2019 0806    Recent Results (from the past 240 hour(s))  SARS CORONAVIRUS 2 (TAT 6-24 HRS) Nasopharyngeal Nasopharyngeal Swab     Status: None   Collection Time: 05/26/19  1:28 PM   Specimen: Nasopharyngeal Swab  Result Value Ref Range Status   SARS Coronavirus 2 NEGATIVE NEGATIVE Final    Comment: (NOTE) SARS-CoV-2 target nucleic acids are NOT DETECTED. The SARS-CoV-2 RNA is generally detectable in upper and lower respiratory specimens during the acute phase of infection. Negative results do  not preclude SARS-CoV-2 infection, do not rule out co-infections with other pathogens, and should not be used as the sole basis for treatment or other patient management decisions. Negative results must be combined with clinical observations, patient history, and epidemiological information. The expected result is Negative. Fact Sheet for Patients: SugarRoll.be Fact Sheet for Healthcare  Providers: https://www.woods-mathews.com/ This test is not yet approved or cleared by the [place] FDA and  has been authorized for detection and/or diagnosis of SARS-CoV-2 by FDA under an Emergency Use Authorization (EUA). This EUA will remain  in effect (meaning this test can be used) for the duration of the COVID-19 declaration under Section 56 4(b)(1) of the Act, 21 U.S.C. section 360bbb-3(b)(1), unless the authorization is terminated or revoked sooner. Performed at [facility], [place] [address] [place], [place] [address]     Studies/Results: DG Chest 2 View  Result Date: 05/26/2019 CLINICAL DATA:  Sickle cell pain crisis EXAM: CHEST - 2 VIEW COMPARISON:  Three days ago FINDINGS: Bilateral porta catheter with tips in good position in the superior cavoatrial junction. Chronic mild interstitial coarsening. There is no edema, consolidation, effusion, or pneumothorax. Normal heart size and mediastinal contours. Cholecystectomy. IMPRESSION: Stable chest.  No evidence of active disease. Electronically Signed   By: Monte Fantasia M.D.   On: 05/26/2019 09:06    Medications: Scheduled Meds: . sodium chloride   Intravenous Once  . acetaminophen  650 mg Oral Once  . Chlorhexidine Gluconate Cloth  6 each Topical Daily  . Deferasirox  1,080 mg Oral Daily  . diphenhydrAMINE  25 mg Intravenous Once  . folic acid  1 mg Oral Daily  . HYDROmorphone   Intravenous Q4H  . ketorolac  15 mg Intravenous Q6H  . mirtazapine  45 mg Oral QHS  . pantoprazole  40 mg Oral Daily  . potassium chloride  20 mEq Oral Daily  . rivaroxaban  20 mg Oral Q supper  . senna-docusate  1 tablet Oral BID  . vitamin B-12  1,000 mcg Oral Daily  . voxelotor  1,500 mg Oral Daily   Continuous Infusions: PRN Meds:.albuterol, diphenhydrAMINE, mometasone-formoterol, naloxone **AND** sodium chloride flush, ondansetron (ZOFRAN) IV, polyethylene  glycol  Consultants:  None  Procedures:  None  Antibiotics:  None  Assessment/Plan: Principal Problem:   Sickle cell pain crisis ([place]) Active Problems:   Leukocytosis   Chronic pain syndrome   History of pulmonary embolism   Hypokalemia   Thrombocytosis (HCC)  Sickle cell disease with pain crisis: Discontinue IV fluids Continue weightbase Dilaudid PCA, no change in settings on today IV Toradol 15 mg every 6 hours for total of 5 days Reevaluate pain scale regularly and monitor vital signs closely  Chronic pain syndrome: Continue to hold Dilaudid by mouth, use PCA as substitute  Sickle cell anemia: Hemoglobin 6.3, decreased from patient's baseline.  Transfuse 1 unit of PRBCs.  CBC in a.m.  History of PE: Continue Xarelto 20 mg daily  History of mild intermittent asthma Stable.  Continue home medications.  Leukocytosis: WBCs decreased from previous.  Patient afebrile.  No signs of infection or inflammation.  Continue to monitor without antibiotics.  CBC in a.m.  Hypokalemia:  Continue to replete.  BMP in a.m.  Code Status: Full Code Family Communication: N/A Disposition Plan: Not yet ready for discharge  [place], MSN, FNP-C Patient [place] [address] [place], [place] [address] [phone]  If 5PM-7AM, please contact night-coverage.  05/27/2019, 8:41 PM  LOS: 1 day

## 2019-05-27 NOTE — Progress Notes (Signed)
CRITICAL VALUE ALERT  Critical Value:  Hgb 6.3 Date & Time Notied: 12/10@0800   Provider Notified: Cammie Sickle  FNP  Orders Received/Actions taken: Orders to follow per Cammie Sickle FNP

## 2019-05-28 LAB — URINE CULTURE: Culture: NO GROWTH

## 2019-05-28 LAB — HEMOGLOBIN AND HEMATOCRIT, BLOOD
HCT: 23.4 % — ABNORMAL LOW (ref 36.0–46.0)
Hemoglobin: 7.8 g/dL — ABNORMAL LOW (ref 12.0–15.0)

## 2019-05-28 MED ORDER — DIPHENHYDRAMINE HCL 50 MG/ML IJ SOLN
25.0000 mg | Freq: Once | INTRAMUSCULAR | Status: AC
  Administered 2019-05-28: 25 mg via INTRAVENOUS
  Filled 2019-05-28: qty 1

## 2019-05-28 NOTE — Progress Notes (Signed)
Subjective: Patient says that pain intensity has improved minimally overnight.  She rates pain at 8/10 characterized as constant and throbbing.  Patient has not attempted to mobilize.  She is maximizing IV Dilaudid PCA.  On today, patient's hemoglobin is 7.8, which is improved from 6.3 on yesterday.  She is status post 1 unit of packed red blood cells.  Hemoglobin has returned to patient's baseline, which is between 7.0-8.0 g/dL.  Patient denies headache, dizziness, paresthesias, urinary symptoms, nausea, vomiting, or diarrhea.  Objective:  Vital signs in last 24 hours:  Vitals:   05/28/19 1010 05/28/19 1051 05/28/19 1237 05/28/19 1326  BP: 118/70   103/70  Pulse: 75   73  Resp: 14 12 12 12   Temp: 98.7 F (37.1 C)     TempSrc: Oral     SpO2: 97% 97% 97% 97%  Weight:      Height:        Intake/Output from previous day:   Intake/Output Summary (Last 24 hours) at 05/28/2019 1725 Last data filed at 05/28/2019 1700 Gross per 24 hour  Intake 23.17 ml  Output 1200 ml  Net -1176.83 ml    Physical Exam: General: Alert, awake, oriented x3, in no acute distress.  HEENT: [place]/AT PEERL, EOMI Neck: Trachea midline,  no masses, no thyromegal,y no JVD, no carotid bruit OROPHARYNX:  Moist, No exudate/ erythema/lesions.  Heart: Regular rate and rhythm, without murmurs, rubs, gallops, PMI non-displaced, no heaves or thrills on palpation.  Lungs: Clear to auscultation, no wheezing or rhonchi noted. No increased vocal fremitus resonant to percussion  Abdomen: Soft, nontender, nondistended, positive bowel sounds, no masses no hepatosplenomegaly noted..  Neuro: No focal neurological deficits noted cranial nerves II through XII grossly intact. DTRs 2+ bilaterally upper and lower extremities. Strength 5 out of 5 in bilateral upper and lower extremities. Musculoskeletal: No warm swelling or erythema around joints, no spinal tenderness noted. Psychiatric: Patient alert and oriented x3, good insight  and cognition, good recent to remote recall. Lymph node survey: No cervical axillary or inguinal lymphadenopathy noted.  Lab Results:  Basic Metabolic Panel:    Component Value Date/Time   NA 137 05/26/2019 0806   K 3.3 (L) 05/26/2019 0806   CL 106 05/26/2019 0806   CO2 21 (L) 05/26/2019 0806   BUN 9 05/26/2019 0806   CREATININE 0.47 05/26/2019 0806   GLUCOSE 105 (H) 05/26/2019 0806   CALCIUM 9.3 05/26/2019 0806   CBC:    Component Value Date/Time   WBC 17.0 (H) 05/27/2019 0648   HGB 7.8 (L) 05/28/2019 0655   HCT 23.4 (L) 05/28/2019 0655   PLT 548 (H) 05/27/2019 0648   MCV 89.0 05/27/2019 0648   NEUTROABS 13.7 (H) 05/26/2019 0806   LYMPHSABS 3.9 05/26/2019 0806   MONOABS 2.8 (H) 05/26/2019 0806   EOSABS 0.1 05/26/2019 0806   BASOSABS 0.1 05/26/2019 0806    Recent Results (from the past 240 hour(s))  SARS CORONAVIRUS 2 (TAT 6-24 HRS) Nasopharyngeal Nasopharyngeal Swab     Status: None   Collection Time: 05/26/19  1:28 PM   Specimen: Nasopharyngeal Swab  Result Value Ref Range Status   SARS Coronavirus 2 NEGATIVE NEGATIVE Final    Comment: (NOTE) SARS-CoV-2 target nucleic acids are NOT DETECTED. The SARS-CoV-2 RNA is generally detectable in upper and lower respiratory specimens during the acute phase of infection. Negative results do not preclude SARS-CoV-2 infection, do not rule out co-infections with other pathogens, and should not be used as the sole basis for treatment  or other patient management decisions. Negative results must be combined with clinical observations, patient history, and epidemiological information. The expected result is Negative. Fact Sheet for Patients: SugarRoll.be Fact Sheet for Healthcare Providers: https://www.woods-mathews.com/ This test is not yet approved or cleared by the [place] FDA and  has been authorized for detection and/or diagnosis of SARS-CoV-2 by FDA under an Emergency Use  Authorization (EUA). This EUA will remain  in effect (meaning this test can be used) for the duration of the COVID-19 declaration under Section 56 4(b)(1) of the Act, 21 U.S.C. section 360bbb-3(b)(1), unless the authorization is terminated or revoked sooner. Performed at [facility], [place] [address], [place], [place] [address]   Urine culture     Status: None   Collection Time: 05/26/19  4:47 PM   Specimen: Urine, Clean Catch  Result Value Ref Range Status   Specimen Description   Final    URINE, CLEAN CATCH Performed at [facility], [place] [address] [place], [place] [address]    Special Requests   Final    NONE Performed at [facility], [place] [address], [place], [place] [address]    Culture   Final    NO GROWTH Performed at [facility], [place] [address] [place], [place] [address]    Report Status 05/28/2019 FINAL  Final    Studies/Results: No results found.  Medications: Scheduled Meds: . Chlorhexidine Gluconate Cloth  6 each Topical Daily  . Deferasirox  1,080 mg Oral Daily  . folic acid  1 mg Oral Daily  . HYDROmorphone   Intravenous Q4H  . ketorolac  15 mg Intravenous Q6H  . mirtazapine  45 mg Oral QHS  . pantoprazole  40 mg Oral Daily  . potassium chloride  20 mEq Oral Daily  . rivaroxaban  20 mg Oral Q supper  . senna-docusate  1 tablet Oral BID  . vitamin B-12  1,000 mcg Oral Daily  . voxelotor  1,500 mg Oral Daily   Continuous Infusions: PRN Meds:.albuterol, diphenhydrAMINE, mometasone-formoterol, naloxone **AND** sodium chloride flush, ondansetron (ZOFRAN) IV, polyethylene glycol  Assessment/Plan: Principal Problem:   Sickle cell pain crisis (HCC) Active Problems:   Leukocytosis   Chronic pain syndrome   History of pulmonary embolism   Hypokalemia   Thrombocytosis (HCC)  Sickle cell disease with pain crisis: Continue Dilaudid PCA, no changes in settings on today. IV Toradol 15 mg every 6 hours for  total of 5 days.  Reevaluate pain scale regularly and monitor vital signs closely.  Chronic pain syndrome: Continue to hold Dilaudid by mouth, use PCA Dilaudid as substitute.  Sickle cell anemia: Hemoglobin returned to baseline at 7.8.  No need for further transfusion at this time.  Continue to monitor closely.  History of PE: Continue Xarelto.  History of mild intermittent asthma: Stable.  Continue home medications.  Leukocytosis: Stable.  Patient afebrile.  No signs of infection or inflammation.  Continue to monitor without antibiotics.  Code Status: Full Code Family Communication: N/A Disposition Plan: Not yet ready for discharge  [place], MSN, FNP-C Patient [place] [address] [place],  [address] [phone]  If 5PM-7AM, please contact night-coverage.  05/28/2019, 5:25 PM  LOS: 2 days

## 2019-05-28 NOTE — Progress Notes (Deleted)
   05/27/19 1009  MEWS Score  MEWS RR 1

## 2019-05-28 NOTE — Progress Notes (Addendum)
   05/27/19 1009  Vitals  Temp 98.2 F (36.8 C)  Temp Source Oral  BP 98/61  MAP (mmHg) 72  BP Location Right Arm  BP Method Automatic  Patient Position (if appropriate) Lying  Pulse Rate 82  Resp 11 (see progress notes)  Oxygen Therapy  SpO2 95 %  O2 Device Room Air  MEWS Score  MEWS RR 1  MEWS Pulse 0  MEWS Systolic 1  MEWS LOC 0  MEWS Temp 0  MEWS Score 2  MEWS Score Color Yellow  MEWS triggered yellow however the respiration reading was not accurate patient was sleeping and monitor halfway off. Nurse has been at the bedside for several minutes noting respirations of 12-14 via the PCA pump. Patient awakened by nurse and respirations rechecked and are currently at 12. Respiration rate corrected

## 2019-05-28 NOTE — Progress Notes (Addendum)
   05/28/19 0155  Vitals  Temp 99.4 F (37.4 C)  Temp Source Oral  BP 96/65  Pulse Rate 94  Resp 13  Oxygen Therapy  SpO2 95 %  O2 Device Room Air  MEWS Score  MEWS RR 1  MEWS Pulse 0  MEWS Systolic 1  MEWS LOC 0  MEWS Temp 0  MEWS Score 2  MEWS Score Color Yellow  MEWS Assessment  Is this an acute change? Yes  Provider Notification  Provider Name/Title Stark Klein, NP  Date Provider Notified 05/28/19  Time Provider Notified [phone]  Notification Type Page  Notification Reason Other (Comment) (pt recieving blood, BP dropped triggering yellow mews)  Response No new orders  Date of Provider Response 05/28/19  Time of Provider Response [phone]   Spoke with provider. Pts MAP 75, will to re-notify if MAP drops below 65.  Pts BP post blood infusion (0430) BP 88/53, MAP 65, notified provider and was directed to check BP manually. Manual BP 88/62. No new orders at this time.

## 2019-05-29 MED ORDER — OXYCODONE HCL 5 MG PO TABS
10.0000 mg | ORAL_TABLET | ORAL | Status: DC | PRN
  Administered 2019-05-29 – 2019-05-30 (×3): 10 mg via ORAL
  Filled 2019-05-29 (×3): qty 2

## 2019-05-29 MED ORDER — DIPHENHYDRAMINE HCL 50 MG/ML IJ SOLN
25.0000 mg | Freq: Once | INTRAMUSCULAR | Status: DC
  Administered 2019-05-29: 25 mg via INTRAVENOUS

## 2019-05-29 MED ORDER — HYDROMORPHONE 1 MG/ML IV SOLN
INTRAVENOUS | Status: DC
  Administered 2019-05-29 (×2): 0 mg via INTRAVENOUS
  Administered 2019-05-30: 2 mg via INTRAVENOUS
  Administered 2019-05-30: 5.5 mg via INTRAVENOUS
  Administered 2019-05-30: 3.5 mg via INTRAVENOUS
  Administered 2019-05-30 (×2): 4 mg via INTRAVENOUS
  Administered 2019-05-30: 2.5 mg via INTRAVENOUS
  Administered 2019-05-30: 30 mg via INTRAVENOUS
  Administered 2019-05-30: 6.5 mg via INTRAVENOUS
  Administered 2019-05-31: 4.5 mg via INTRAVENOUS
  Administered 2019-05-31: 30 mg via INTRAVENOUS
  Administered 2019-05-31 (×2): 2.5 mg via INTRAVENOUS
  Administered 2019-05-31: 4 mg via INTRAVENOUS
  Filled 2019-05-29 (×3): qty 30

## 2019-05-29 MED ORDER — DIPHENHYDRAMINE HCL 50 MG/ML IJ SOLN
25.0000 mg | Freq: Four times a day (QID) | INTRAMUSCULAR | Status: DC | PRN
  Administered 2019-05-30 – 2019-06-01 (×8): 25 mg via INTRAVENOUS
  Filled 2019-05-29 (×11): qty 1

## 2019-05-29 NOTE — Progress Notes (Signed)
Subjective: Patient says that pain intensity has improved some overnight.  Pain intensity is 7/10.  Pain is characterized as constant and throbbing.  Patient has not mobilized in room.  Patient denies headache, chest pain, shortness of breath, nausea, vomiting, or diarrhea.  Objective:  Vital signs in last 24 hours:  Vitals:   05/29/19 0609 05/29/19 0743 05/29/19 1041 05/29/19 1219  BP: 106/69  111/69   Pulse: 81  85   Resp: 16 10 14 10   Temp: 98.6 F (37 C)  99.5 F (37.5 C)   TempSrc: Oral  Oral   SpO2: 96% 96% 95% 96%  Weight:      Height:        Intake/Output from previous day:   Intake/Output Summary (Last 24 hours) at 05/29/2019 1303 Last data filed at 05/29/2019 [id number] Gross per 24 hour  Intake -  Output 1200 ml  Net -1200 ml    Physical Exam: General: Alert, awake, oriented x3, in no acute distress.  HEENT: [place]/AT PEERL, EOMI Neck: Trachea midline,  no masses, no thyromegal,y no JVD, no carotid bruit OROPHARYNX:  Moist, No exudate/ erythema/lesions.  Heart: Regular rate and rhythm, without murmurs, rubs, gallops, PMI non-displaced, no heaves or thrills on palpation.  Lungs: Clear to auscultation, no wheezing or rhonchi noted. No increased vocal fremitus resonant to percussion  Abdomen: Soft, nontender, nondistended, positive bowel sounds, no masses no hepatosplenomegaly noted..  Neuro: No focal neurological deficits noted cranial nerves II through XII grossly intact. DTRs 2+ bilaterally upper and lower extremities. Strength 5 out of 5 in bilateral upper and lower extremities. Musculoskeletal: No warm swelling or erythema around joints, no spinal tenderness noted. Psychiatric: Patient alert and oriented x3, good insight and cognition, good recent to remote recall. Lymph node survey: No cervical axillary or inguinal lymphadenopathy noted.  Lab Results:  Basic Metabolic Panel:    Component Value Date/Time   NA 137 05/26/2019 0806   K 3.3 (L) 05/26/2019 0806   CL 106 05/26/2019 0806   CO2 21 (L) 05/26/2019 0806   BUN 9 05/26/2019 0806   CREATININE 0.47 05/26/2019 0806   GLUCOSE 105 (H) 05/26/2019 0806   CALCIUM 9.3 05/26/2019 0806   CBC:    Component Value Date/Time   WBC 17.0 (H) 05/27/2019 0648   HGB 7.8 (L) 05/28/2019 0655   HCT 23.4 (L) 05/28/2019 0655   PLT 548 (H) 05/27/2019 0648   MCV 89.0 05/27/2019 0648   NEUTROABS 13.7 (H) 05/26/2019 0806   LYMPHSABS 3.9 05/26/2019 0806   MONOABS 2.8 (H) 05/26/2019 0806   EOSABS 0.1 05/26/2019 0806   BASOSABS 0.1 05/26/2019 0806    Recent Results (from the past 240 hour(s))  SARS CORONAVIRUS 2 (TAT 6-24 HRS) Nasopharyngeal Nasopharyngeal Swab     Status: None   Collection Time: 05/26/19  1:28 PM   Specimen: Nasopharyngeal Swab  Result Value Ref Range Status   SARS Coronavirus 2 NEGATIVE NEGATIVE Final    Comment: (NOTE) SARS-CoV-2 target nucleic acids are NOT DETECTED. The SARS-CoV-2 RNA is generally detectable in upper and lower respiratory specimens during the acute phase of infection. Negative results do not preclude SARS-CoV-2 infection, do not rule out co-infections with other pathogens, and should not be used as the sole basis for treatment or other patient management decisions. Negative results must be combined with clinical observations, patient history, and epidemiological information. The expected result is Negative. Fact Sheet for Patients: SugarRoll.be Fact Sheet for Healthcare Providers: https://www.woods-mathews.com/ This test is not yet approved or  cleared by the [place] and  has been authorized for detection and/or diagnosis of SARS-CoV-2 by FDA under an Emergency Use Authorization (EUA). This EUA will remain  in effect (meaning this test can be used) for the duration of the COVID-19 declaration under Section 56 4(b)(1) of the Act, 21 U.S.C. section 360bbb-3(b)(1), unless the authorization is terminated or revoked  sooner. Performed at [facility], [place] [address], [place], [place] [address]   Urine culture     Status: None   Collection Time: 05/26/19  4:47 PM   Specimen: Urine, Clean Catch  Result Value Ref Range Status   Specimen Description   Final    URINE, CLEAN CATCH Performed at [facility], [place] [address] [place], [place] [address]    Special Requests   Final    NONE Performed at [facility], [place] [address], [place], [place] [address]    Culture   Final    NO GROWTH Performed at [facility], [place] [address], [place], [place] [address]    Report Status 05/28/2019 FINAL  Final    Studies/Results: No results found.  Medications: Scheduled Meds: . Chlorhexidine Gluconate Cloth  6 each Topical Daily  . Deferasirox  1,080 mg Oral Daily  . folic acid  1 mg Oral Daily  . HYDROmorphone   Intravenous Q4H  . ketorolac  15 mg Intravenous Q6H  . mirtazapine  45 mg Oral QHS  . pantoprazole  40 mg Oral Daily  . potassium chloride  20 mEq Oral Daily  . rivaroxaban  20 mg Oral Q supper  . senna-docusate  1 tablet Oral BID  . vitamin B-12  1,000 mcg Oral Daily  . voxelotor  1,500 mg Oral Daily   Continuous Infusions: PRN Meds:.albuterol, diphenhydrAMINE, mometasone-formoterol, naloxone **AND** sodium chloride flush, ondansetron (ZOFRAN) IV, oxyCODONE, polyethylene glycol  Consultants:  None  Procedures:  None  Antibiotics:  None  Assessment/Plan: Principal Problem:   Sickle cell pain crisis (HCC) Active Problems:   Leukocytosis   Chronic pain syndrome   History of pulmonary embolism   Hypokalemia   Thrombocytosis (HCC)   Sickle cell disease with pain crisis: Weaning IV Dilaudid PCA.  Settings changed to 0.5 mg, 10-minute lockout, and 2 mg/h. Oxycodone 10 mg every 4 hours as needed for breakthrough pain IV Toradol 15 mg every 6 hours for total of 5 days. Reevaluate pain scale regularly and monitor vital signs  closely.  Chronic pain syndrome: Continue to hold Dilaudid by mouth, use PCA Dilaudid as substitute.  Sickle cell anemia: Hemoglobin stable.  No need for further transfusions at this time.  Continue to monitor closely.  History of PE: Continue Xarelto.  History of mild intermittent asthma: Stable.  Continue home medications. Code Status: Full Code Family Communication: N/A Disposition Plan: Not yet ready for discharge  [place], MSN, FNP-C Patient [place] [address] [place],  [address] [phone]   If 7PM-7AM, please contact night-coverage.  05/29/2019, 1:03 PM  LOS: 3 days

## 2019-05-30 LAB — CBC
HCT: 23.1 % — ABNORMAL LOW (ref 36.0–46.0)
Hemoglobin: 7.6 g/dL — ABNORMAL LOW (ref 12.0–15.0)
MCH: 29.5 pg (ref 26.0–34.0)
MCHC: 32.9 g/dL (ref 30.0–36.0)
MCV: 89.5 fL (ref 80.0–100.0)
Platelets: 539 10*3/uL — ABNORMAL HIGH (ref 150–400)
RBC: 2.58 MIL/uL — ABNORMAL LOW (ref 3.87–5.11)
RDW: 19.6 % — ABNORMAL HIGH (ref 11.5–15.5)
WBC: 15.3 10*3/uL — ABNORMAL HIGH (ref 4.0–10.5)
nRBC: 0.3 % — ABNORMAL HIGH (ref 0.0–0.2)

## 2019-05-30 LAB — BASIC METABOLIC PANEL
Anion gap: 8 (ref 5–15)
BUN: 8 mg/dL (ref 6–20)
CO2: 24 mmol/L (ref 22–32)
Calcium: 8.5 mg/dL — ABNORMAL LOW (ref 8.9–10.3)
Chloride: 106 mmol/L (ref 98–111)
Creatinine, Ser: 0.57 mg/dL (ref 0.44–1.00)
GFR calc Af Amer: >60 mL/min (ref >60)
GFR calc non Af Amer: >60 mL/min (ref >60)
Glucose, Bld: 102 mg/dL — ABNORMAL HIGH (ref 70–99)
Potassium: 3.8 mmol/L (ref 3.5–5.1)
Sodium: 138 mmol/L (ref 135–145)

## 2019-05-30 NOTE — Progress Notes (Signed)
Subjective: Patient says that pain intensity remains the same despite treatment regimen. Pain intensity is 7/10. Patient is currently mobilizing in room.   Patient denies headache, chest pain, shortness of breath, nausea, vomiting, or diarrhea.     Objective:  Vital signs in last 24 hours:  Vitals:   05/30/19 0733 05/30/19 0939 05/30/19 1156 05/30/19 1345  BP:  (!) 123/91  135/81  Pulse:  84  90  Resp: 10 14 10 12   Temp:  98.6 F (37 C)  98.9 F (37.2 C)  TempSrc:  Oral  Oral  SpO2: 97% 96% 97% 98%  Weight:      Height:        Intake/Output from previous day:   Intake/Output Summary (Last 24 hours) at 05/30/2019 1412 Last data filed at 05/30/2019 1000 Gross per 24 hour  Intake 920 ml  Output 400 ml  Net 520 ml    Physical Exam: General: Alert, awake, oriented x3, in no acute distress.  HEENT: [place]/AT PEERL, EOMI Neck: Trachea midline,  no masses, no thyromegal,y no JVD, no carotid bruit OROPHARYNX:  Moist, No exudate/ erythema/lesions.  Heart: Regular rate and rhythm, without murmurs, rubs, gallops, PMI non-displaced, no heaves or thrills on palpation.  Lungs: Clear to auscultation, no wheezing or rhonchi noted. No increased vocal fremitus resonant to percussion  Abdomen: Soft, nontender, nondistended, positive bowel sounds, no masses no hepatosplenomegaly noted..  Neuro: No focal neurological deficits noted cranial nerves II through XII grossly intact. DTRs 2+ bilaterally upper and lower extremities. Strength 5 out of 5 in bilateral upper and lower extremities. Musculoskeletal: No warm swelling or erythema around joints, no spinal tenderness noted. Psychiatric: Patient alert and oriented x3, good insight and cognition, good recent to remote recall. Lymph node survey: No cervical axillary or inguinal lymphadenopathy noted.  Lab Results:  Basic Metabolic Panel:    Component Value Date/Time   NA 138 05/30/2019 0751   K 3.8 05/30/2019 0751   CL 106 05/30/2019 0751   CO2 24 05/30/2019 0751   BUN 8 05/30/2019 0751   CREATININE 0.57 05/30/2019 0751   GLUCOSE 102 (H) 05/30/2019 0751   CALCIUM 8.5 (L) 05/30/2019 0751   CBC:    Component Value Date/Time   WBC 15.3 (H) 05/30/2019 0751   HGB 7.6 (L) 05/30/2019 0751   HCT 23.1 (L) 05/30/2019 0751   PLT 539 (H) 05/30/2019 0751   MCV 89.5 05/30/2019 0751   NEUTROABS 13.7 (H) 05/26/2019 0806   LYMPHSABS 3.9 05/26/2019 0806   MONOABS 2.8 (H) 05/26/2019 0806   EOSABS 0.1 05/26/2019 0806   BASOSABS 0.1 05/26/2019 0806    Recent Results (from the past 240 hour(s))  SARS CORONAVIRUS 2 (TAT 6-24 HRS) Nasopharyngeal Nasopharyngeal Swab     Status: None   Collection Time: 05/26/19  1:28 PM   Specimen: Nasopharyngeal Swab  Result Value Ref Range Status   SARS Coronavirus 2 NEGATIVE NEGATIVE Final    Comment: (NOTE) SARS-CoV-2 target nucleic acids are NOT DETECTED. The SARS-CoV-2 RNA is generally detectable in upper and lower respiratory specimens during the acute phase of infection. Negative results do not preclude SARS-CoV-2 infection, do not rule out co-infections with other pathogens, and should not be used as the sole basis for treatment or other patient management decisions. Negative results must be combined with clinical observations, patient history, and epidemiological information. The expected result is Negative. Fact Sheet for Patients: SugarRoll.be Fact Sheet for Healthcare Providers: https://www.woods-mathews.com/ This test is not yet approved or cleared by the  [place] [employer] and  has been authorized for detection and/or diagnosis of SARS-CoV-2 by FDA under an Print production planner (EUA). This EUA will remain  in effect (meaning this test can be used) for the duration of the COVID-19 declaration under Section 56 4(b)(1) of the Act, 21 U.S.C. section 360bbb-3(b)(1), unless the authorization is terminated or revoked sooner. Performed at [facility], [place] [address] [place], [place] [address]   Urine culture     Status: None   Collection Time: 05/26/19  4:47 PM   Specimen: Urine, Clean Catch  Result Value Ref Range Status   Specimen Description   Final    URINE, CLEAN CATCH Performed at [facility], [place] [address], [place], [place] [address]    Special Requests   Final    NONE Performed at [facility], [place] [address], [place], [place] [address]    Culture   Final    NO GROWTH Performed at [facility], [place] [address] [place], [place] [address]    Report Status 05/28/2019 FINAL  Final    Studies/Results: No results found.  Medications: Scheduled Meds: . Chlorhexidine Gluconate Cloth  6 each Topical Daily  . Deferasirox  1,080 mg Oral Daily  . folic acid  1 mg Oral Daily  . HYDROmorphone   Intravenous Q4H  . ketorolac  15 mg Intravenous Q6H  . mirtazapine  45 mg Oral QHS  . pantoprazole  40 mg Oral Daily  . potassium chloride  20 mEq Oral Daily  . rivaroxaban  20 mg Oral Q supper  . senna-docusate  1 tablet Oral BID  . vitamin B-12  1,000 mcg Oral Daily  . voxelotor  1,500 mg Oral Daily   Continuous Infusions: PRN Meds:.albuterol, diphenhydrAMINE, mometasone-formoterol, naloxone **AND** sodium chloride flush, ondansetron (ZOFRAN) IV, oxyCODONE, polyethylene glycol  Consultants:  None  Procedures:  None  Antibiotics:  None   Assessment/Plan: Principal Problem:   Sickle cell pain crisis (HCC) Active Problems:   Leukocytosis   Chronic pain syndrome   History of pulmonary embolism   Hypokalemia   Thrombocytosis (HCC)  Sickle cell disease with pain crisis:  Weaning IV dilaudid PCA.  Oxycodone 10 mg every 4 hours prn for breakthrough pain IV Toradol 15 mg every 6 hours as needed for a total of 5 days Reevaluate pain scale regularly and monitor vital signs closely.   Chronic pain syndrome:  Continue to hold dilaudid by mouth, use PCA  dilaudid as substitute.   Sickle cell anemia:  Hemoglobin stable. No need for further transfusion at this time. Continue to monitor closely.   History of PE:  Continue Xarelto   Code Status: Full Code Family Communication: N/A Disposition Plan: Not yet ready for discharge   [place], MSN, FNP-C Patient [place] [address] [place], [place] [address] [phone]  If 7PM-7AM, please contact night-coverage.  05/30/2019, 2:12 PM  LOS: 4 days    This note was prepared using Dragon speech recognition software, errors in dictation are unintentional.

## 2019-05-31 LAB — TYPE AND SCREEN
ABO/RH(D): A POS
Antibody Screen: NEGATIVE
Unit division: 0

## 2019-05-31 LAB — BPAM RBC
Blood Product Expiration Date: 202101112359
ISSUE DATE / TIME: 202012110125
Unit Type and Rh: 5100

## 2019-05-31 LAB — CBC
HCT: 22.3 % — ABNORMAL LOW (ref 36.0–46.0)
Hemoglobin: 7.4 g/dL — ABNORMAL LOW (ref 12.0–15.0)
MCH: 29.5 pg (ref 26.0–34.0)
MCHC: 33.2 g/dL (ref 30.0–36.0)
MCV: 88.8 fL (ref 80.0–100.0)
Platelets: 534 10*3/uL — ABNORMAL HIGH (ref 150–400)
RBC: 2.51 MIL/uL — ABNORMAL LOW (ref 3.87–5.11)
RDW: 18.7 % — ABNORMAL HIGH (ref 11.5–15.5)
WBC: 14.8 10*3/uL — ABNORMAL HIGH (ref 4.0–10.5)
nRBC: 0.4 % — ABNORMAL HIGH (ref 0.0–0.2)

## 2019-05-31 MED ORDER — HYDROMORPHONE HCL 4 MG PO TABS
4.0000 mg | ORAL_TABLET | ORAL | Status: DC
  Administered 2019-05-31 – 2019-06-01 (×5): 4 mg via ORAL
  Filled 2019-05-31 (×5): qty 1

## 2019-05-31 MED ORDER — HYDROMORPHONE HCL 2 MG/ML IJ SOLN
2.0000 mg | Freq: Four times a day (QID) | INTRAMUSCULAR | Status: DC | PRN
  Administered 2019-05-31 – 2019-06-01 (×4): 2 mg via INTRAVENOUS
  Filled 2019-05-31 (×5): qty 1

## 2019-05-31 NOTE — Progress Notes (Signed)
Subjective: Patient states that pain intensity has improved.  She rates pain as 6/10 primarily to mid chest and low back.  Patient states that she has been able to get out of bed without assistance.  She also says that she cannot manage at home at current pain level.  She denies headache, shortness of breath, dizziness, paresthesias, urinary symptoms, nausea, vomiting, or diarrhea.  Objective:  Vital signs in last 24 hours:  Vitals:   05/31/19 0530 05/31/19 0836 05/31/19 0957 05/31/19 1140  BP: 118/83  118/88   Pulse: 94  80   Resp: 14 16 18 16   Temp: 98.7 F (37.1 C)  98.7 F (37.1 C)   TempSrc: Oral  Oral   SpO2: 97% 100% 97% 99%  Weight:      Height:        Intake/Output from previous day:   Intake/Output Summary (Last 24 hours) at 05/31/2019 1349 Last data filed at 05/30/2019 1645 Gross per 24 hour  Intake 120 ml  Output 700 ml  Net -580 ml    Physical Exam: General: Alert, awake, oriented x3, in no acute distress.  HEENT: [place]/AT PEERL, EOMI Neck: Trachea midline,  no masses, no thyromegal,y no JVD, no carotid bruit OROPHARYNX:  Moist, No exudate/ erythema/lesions.  Heart: Regular rate and rhythm, without murmurs, rubs, gallops, PMI non-displaced, no heaves or thrills on palpation.  Lungs: Clear to auscultation, no wheezing or rhonchi noted. No increased vocal fremitus resonant to percussion  Abdomen: Soft, nontender, nondistended, positive bowel sounds, no masses no hepatosplenomegaly noted..  Neuro: No focal neurological deficits noted cranial nerves II through XII grossly intact. DTRs 2+ bilaterally upper and lower extremities. Strength 5 out of 5 in bilateral upper and lower extremities. Musculoskeletal: No warm swelling or erythema around joints, no spinal tenderness noted. Psychiatric: Patient alert and oriented x3, good insight and cognition, good recent to remote recall. Lymph node survey: No cervical axillary or inguinal lymphadenopathy noted.  Lab  Results:  Basic Metabolic Panel:    Component Value Date/Time   NA 138 05/30/2019 0751   K 3.8 05/30/2019 0751   CL 106 05/30/2019 0751   CO2 24 05/30/2019 0751   BUN 8 05/30/2019 0751   CREATININE 0.57 05/30/2019 0751   GLUCOSE 102 (H) 05/30/2019 0751   CALCIUM 8.5 (L) 05/30/2019 0751   CBC:    Component Value Date/Time   WBC 14.8 (H) 05/31/2019 1238   HGB 7.4 (L) 05/31/2019 1238   HCT 22.3 (L) 05/31/2019 1238   PLT 534 (H) 05/31/2019 1238   MCV 88.8 05/31/2019 1238   NEUTROABS 13.7 (H) 05/26/2019 0806   LYMPHSABS 3.9 05/26/2019 0806   MONOABS 2.8 (H) 05/26/2019 0806   EOSABS 0.1 05/26/2019 0806   BASOSABS 0.1 05/26/2019 0806    Recent Results (from the past 240 hour(s))  SARS CORONAVIRUS 2 (TAT 6-24 HRS) Nasopharyngeal Nasopharyngeal Swab     Status: None   Collection Time: 05/26/19  1:28 PM   Specimen: Nasopharyngeal Swab  Result Value Ref Range Status   SARS Coronavirus 2 NEGATIVE NEGATIVE Final    Comment: (NOTE) SARS-CoV-2 target nucleic acids are NOT DETECTED. The SARS-CoV-2 RNA is generally detectable in upper and lower respiratory specimens during the acute phase of infection. Negative results do not preclude SARS-CoV-2 infection, do not rule out co-infections with other pathogens, and should not be used as the sole basis for treatment or other patient management decisions. Negative results must be combined with clinical observations, patient history, and epidemiological information.  The expected result is Negative. Fact Sheet for Patients: SugarRoll.be Fact Sheet for Healthcare Providers: https://www.woods-mathews.com/ This test is not yet approved or cleared by the [place] FDA and  has been authorized for detection and/or diagnosis of SARS-CoV-2 by FDA under an Emergency Use Authorization (EUA). This EUA will remain  in effect (meaning this test can be used) for the duration of the COVID-19 declaration under  Section 56 4(b)(1) of the Act, 21 U.S.C. section 360bbb-3(b)(1), unless the authorization is terminated or revoked sooner. Performed at [facility], [place] [address] [place], [place] [address]   Urine culture     Status: None   Collection Time: 05/26/19  4:47 PM   Specimen: Urine, Clean Catch  Result Value Ref Range Status   Specimen Description   Final    URINE, CLEAN CATCH Performed at [facility], [place] [address] [place], [place] [address]    Special Requests   Final    NONE Performed at [facility], [place] [address], [place], [place] [address]    Culture   Final    NO GROWTH Performed at [facility], [place] [address], [place],  [address]    Report Status 05/28/2019 FINAL  Final    Studies/Results: No results found.  Medications: Scheduled Meds: . Chlorhexidine Gluconate Cloth  6 each Topical Daily  . Deferasirox  1,080 mg Oral Daily  . folic acid  1 mg Oral Daily  . HYDROmorphone  4 mg Oral Q4H while awake  . mirtazapine  45 mg Oral QHS  . pantoprazole  40 mg Oral Daily  . potassium chloride  20 mEq Oral Daily  . rivaroxaban  20 mg Oral Q supper  . senna-docusate  1 tablet Oral BID  . vitamin B-12  1,000 mcg Oral Daily  . voxelotor  1,500 mg Oral Daily   Continuous Infusions: PRN Meds:.albuterol, diphenhydrAMINE, HYDROmorphone (DILAUDID) injection, mometasone-formoterol, polyethylene glycol  Consultants:  None  Procedures:  None  Antibiotics:  None  Assessment/Plan: Principal Problem:   Sickle cell pain crisis (HCC) Active Problems:   Leukocytosis   Chronic pain syndrome   History of pulmonary embolism   Hypokalemia   Thrombocytosis (HCC)  Sickle cell disease with pain crisis: Discontinue IV Dilaudid PCA. Scheduled Dilaudid 4 mg every 4 hours while awake Dilaudid 2 mg IV every 6 hours as needed for severe, breakthrough pain. IV Toradol 15 mg every 6 hours for total of 5 days. Reevaluate  pain scale regularly monitor vital signs closely  Chronic pain syndrome: PCA discontinued, restart home medications.  Sickle cell anemia: Hemoglobin stable.  Consistent with patient's baseline.  No further need for transfusion at this time.  Continue to monitor closely.  History of PE: Continue Xarelto.  Code Status: Full Code Family Communication: N/A Disposition Plan: Not yet ready for discharge  [place], MSN, FNP-C Patient [place] [address] [place],  [address] [phone]  If 5PM-7AM, please contact night-coverage.  05/31/2019, 1:49 PM  LOS: 5 days

## 2019-05-31 NOTE — Care Management Important Message (Signed)
Important Message  Patient Details IM Letter given to Molly Doctor RN Case Manager to present to the Patient Name: Molly Gutierrez MRN: [id number] Date of Birth: Aug 14, 1980   Medicare Important Message Given:  Yes     Molly Gutierrez 05/31/2019, 10:31 AM

## 2019-06-01 MED ORDER — HYDROMORPHONE HCL 2 MG/ML IJ SOLN
2.0000 mg | Freq: Once | INTRAMUSCULAR | Status: AC
  Administered 2019-06-01: 08:00:00 2 mg via INTRAVENOUS

## 2019-06-01 MED ORDER — HYDROMORPHONE HCL 4 MG PO TABS: 4.0000 mg | ORAL_TABLET | Freq: Four times a day (QID) | ORAL | 0 refills | Status: DC | PRN

## 2019-06-01 MED ORDER — LIP MEDEX EX OINT
TOPICAL_OINTMENT | CUTANEOUS | Status: AC
  Filled 2019-06-01: qty 7

## 2019-06-01 MED ORDER — HEPARIN SOD (PORK) LOCK FLUSH 100 UNIT/ML IV SOLN
500.0000 [IU] | INTRAVENOUS | Status: DC | PRN
  Filled 2019-06-01: qty 5

## 2019-06-01 MED ORDER — HEPARIN SOD (PORK) LOCK FLUSH 100 UNIT/ML IV SOLN
500.0000 [IU] | INTRAVENOUS | Status: DC
  Administered 2019-06-01: 17:00:00 500 [IU]

## 2019-06-01 NOTE — Discharge Summary (Signed)
Physician Discharge Summary  [facility] DQ:[id number] DOB: 1980-09-21 DOA: 05/26/2019  PCP: System, Provider Not In  Admit date: 05/26/2019  Discharge date: 06/01/2019  Discharge Diagnoses:  Principal Problem:   Sickle cell pain crisis ([place]) Active Problems:   Leukocytosis   Chronic pain syndrome   History of pulmonary embolism   Hypokalemia   Thrombocytosis ([place])   Discharge Condition: Stable  Disposition:  Pt is discharged home in good condition and is to establish care with [facility].  Patient does not have a PCP at present this is Molly Gutierrez is instructed to increase activity slowly and balance with rest for the next few days, and use prescribed medication to complete treatment of pain  Diet: Regular Wt Readings from Last 3 Encounters:  05/27/19 52.9 kg  05/21/19 54.8 kg  05/12/19 54.8 kg    History of present illness:  Molly Gutierrez, a 38 year old female with a medical history significant for sickle cell disease, chronic pain syndrome, history of asthma, history of eczema, opiate tolerance and dependence, history of anemia of chronic disease, and history of pulmonary embolism on Xarelto presents complaining of pain primarily to mid chest and upper back.  Patient says the pain has been increasing over the past 4 days.  She attributes current pain crisis to increased stress and changes in weather.  She says the pain has been uncontrolled by home medications.  Pain is similar to her typical pain crisis.  She denies fever, shortness of breath, or persistent cough.  She says that doses of IV Dilaudid in the ER have improved pain some, but she continues to have 8/10 pain intensity despite home medications.  ER course: Vital signs are reassuring, but blood pressures are fairly soft following IV Dilaudid.  Patient is afebrile.  Hemoglobin is 7.9 which is consistent with patient's baseline.  WBCs 20.7.  Chest x-ray shows no acute cardiopulmonary  process.  COVID-19 test pending.  Patient's pain persists despite several doses of IV Dilaudid, IV Toradol, and IV fluids.  Patient admitted to [facility] for management of sickle cell pain crisis.  [facility] Course:  Sickle cell disease with pain crisis: Patient admitted to [place] with pain primarily to central chest and upper, lower back. Patient was  managed appropriately with IVF, IV Dilaudid via PCA and IV Toradol, as well as other adjunct therapies per sickle cell pain management protocols.  IV Dilaudid via PCA was weaned appropriately.  Patient was transitioned to Dilaudid 4 mg every 4 hours while awake.  Pain was controlled on medication regimen.  Patient does not have a PCP or hematologist.  She is currently out of home medications.  She is scheduled to establish care with [facility] hematology in 2 weeks.  Also, patient advised to establish care with a PCP.  Patient states that she lives in the [place], [place] area and is not interested in getting a PCP there.  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  Dilaudid 4 mg #30 was sent to patient's pharmacy.  Reviewed PDMP prior to prescribing opiate medications, no inconsistencies noted.  Last several prescriptions have been written by sickle cell services following inpatient  Patient has a history of pulmonary embolism.  She has been taking Xarelto consistently without interruption.  There are no signs of bleeding.  Patient alert, oriented, and ambulating without assistance.  She is afebrile and oxygen saturation 100% on RA Patient was discharged home today in a hemodynamically stable condition.  Discharge Exam: Vitals:   05/31/19 2042 06/01/19 0400  BP: 110/80 117/86  Pulse: 82 (!) 102  Resp: 17 17  Temp: 98.6 F (37 C) 98.1 F (36.7 C)  SpO2: 98% 97%   Vitals:   05/31/19 1407 05/31/19 1700 05/31/19 2042 06/01/19 0400  BP: 115/77 110/72 110/80 117/86  Pulse: 80 85 82 (!) 102  Resp: 18 18 17 17    Temp: 98.6 F (37 C) 98.7 F (37.1 C) 98.6 F (37 C) 98.1 F (36.7 C)  TempSrc: Oral Oral Oral Oral  SpO2: 99% 99% 98% 97%  Weight:      Height:        General appearance : Awake, alert, not in any distress. Speech Clear. Not toxic looking HEENT: Atraumatic and Normocephalic, pupils equally reactive to light and accomodation Neck: Supple, no JVD. No cervical lymphadenopathy.  Chest: Good air entry bilaterally, no added sounds  CVS: S1 S2 regular, no murmurs.  Abdomen: Bowel sounds present, Non tender and not distended with no gaurding, rigidity or rebound. Extremities: B/L Lower Ext shows no edema, both legs are warm to touch Neurology: Awake alert, and oriented X 3, CN II-XII intact, Non focal Skin: No Rash  Discharge Instructions  Discharge Instructions    Discharge patient   Complete by: As directed    Discharge disposition: 01-Home or Self Care   Discharge patient date: 06/01/2019     Allergies as of 06/01/2019      Reactions   Cefaclor Hives, Swelling   Hydroxyurea Other (See Comments), Palpitations   Lower blood levels and HR Other reaction(s): Hypotension, Other (See Comments) "it messes me up, it drops my levels and stuff" Lower blood levels and HR "it messes me up, it drops my levels and stuff"      Medication List    TAKE these medications   albuterol 108 (90 Base) MCG/ACT inhaler Commonly known as: VENTOLIN HFA Inhale 2 puffs into the lungs 2 (two) times daily as needed for wheezing or shortness of breath.   Deferasirox 360 MG Tabs Take 1,080 mg by mouth daily.   diphenhydrAMINE 25 mg capsule Commonly known as: BENADRYL Take 25 mg by mouth 3 (three) times daily as needed for itching.   folic acid 1 MG tablet Commonly known as: FOLVITE Take 1 mg by mouth daily.   HYDROmorphone 4 MG tablet Commonly known as: DILAUDID Take 1 tablet (4 mg total) by mouth every 6 (six) hours as needed for severe pain.   mirtazapine 45 MG tablet Commonly known  as: REMERON Take 45 mg by mouth at bedtime.   mometasone-formoterol 100-5 MCG/ACT Aero Commonly known as: DULERA Inhale 2 puffs into the lungs daily as needed for wheezing or shortness of breath.   omeprazole 20 MG capsule Commonly known as: PRILOSEC Take 20 mg by mouth 2 (two) times a day.   Oxbryta 500 MG Tabs tablet Generic drug: voxelotor Take 1,500 mg by mouth daily.   promethazine 25 MG tablet Commonly known as: PHENERGAN Take 25 mg by mouth every 6 (six) hours as needed for nausea/vomiting.   vitamin B-12 1000 MCG tablet Commonly known as: CYANOCOBALAMIN Take 1,000 mcg by mouth daily.   Vitamin D3 25 MCG (1000 UT) Caps Take 1,000 Units by mouth daily.   Xarelto 20 MG Tabs tablet Generic drug: rivaroxaban Take 1 tablet (20 mg total) by mouth daily.       The results of significant diagnostics from this hospitalization (including imaging, microbiology, ancillary and laboratory) are listed  below for reference.    Significant Diagnostic Studies: DG Chest 2 View  Result Date: 05/26/2019 CLINICAL DATA:  Sickle cell pain crisis EXAM: CHEST - 2 VIEW COMPARISON:  Three days ago FINDINGS: Bilateral porta catheter with tips in good position in the superior cavoatrial junction. Chronic mild interstitial coarsening. There is no edema, consolidation, effusion, or pneumothorax. Normal heart size and mediastinal contours. Cholecystectomy. IMPRESSION: Stable chest.  No evidence of active disease. Electronically Signed   By: Monte Fantasia M.D.   On: 05/26/2019 09:06   DG Chest 2 View  Result Date: 05/23/2019 CLINICAL DATA:  Chest pain, sickle cell pain since Friday EXAM: CHEST - 2 VIEW COMPARISON:  05/21/2019 FINDINGS: RIGHT jugular Port-A-Cath with tip projecting over SVC. LEFT jugular Port-A-Cath with tip projecting over cavoatrial junction. Borderline enlargement of cardiac silhouette. Mediastinal contours and pulmonary vascularity normal. Minimal chronic atelectasis or scarring  at RIGHT base. Remaining lungs clear. No pleural effusion or pneumothorax. No acute osseous findings. IMPRESSION: Minimal chronic atelectasis versus scarring at RIGHT base. No acute abnormalities. Electronically Signed   By: Lavonia Dana M.D.   On: 05/23/2019 13:38   DG Chest 2 View  Result Date: 05/21/2019 CLINICAL DATA:  Chest pain.  Sickle cell disease.  History of asthma EXAM: CHEST - 2 VIEW COMPARISON:  05/04/2019 FINDINGS: Bilateral porta caths remain in place. Mild cardiac enlargement. Chronic interstitial coarsening. No superimposed airspace consolidation. No pleural effusion or edema. IMPRESSION: No acute cardiopulmonary abnormalities. Electronically Signed   By: Kerby Moors M.D.   On: 05/21/2019 18:53   DG Chest 2 View  Result Date: 05/04/2019 CLINICAL DATA:  Chest pain EXAM: CHEST - 2 VIEW COMPARISON:  05/01/2019 FINDINGS: Bilateral Port-A-Cath remain in place, unchanged. Cardiomegaly with vascular congestion. No confluent opacities, effusions or edema. No acute bony abnormality. IMPRESSION: Cardiomegaly, mild vascular congestion. Electronically Signed   By: Rolm Baptise M.D.   On: 05/04/2019 19:30    Microbiology: Recent Results (from the past 240 hour(s))  SARS CORONAVIRUS 2 (TAT 6-24 HRS) Nasopharyngeal Nasopharyngeal Swab     Status: None   Collection Time: 05/26/19  1:28 PM   Specimen: Nasopharyngeal Swab  Result Value Ref Range Status   SARS Coronavirus 2 NEGATIVE NEGATIVE Final    Comment: (NOTE) SARS-CoV-2 target nucleic acids are NOT DETECTED. The SARS-CoV-2 RNA is generally detectable in upper and lower respiratory specimens during the acute phase of infection. Negative results do not preclude SARS-CoV-2 infection, do not rule out co-infections with other pathogens, and should not be used as the sole basis for treatment or other patient management decisions. Negative results must be combined with clinical observations, patient history, and epidemiological  information. The expected result is Negative. Fact Sheet for Patients: SugarRoll.be Fact Sheet for Healthcare Providers: https://www.woods-mathews.com/ This test is not yet approved or cleared by the [place] FDA and  has been authorized for detection and/or diagnosis of SARS-CoV-2 by FDA under an Emergency Use Authorization (EUA). This EUA will remain  in effect (meaning this test can be used) for the duration of the COVID-19 declaration under Section 56 4(b)(1) of the Act, 21 U.S.C. section 360bbb-3(b)(1), unless the authorization is terminated or revoked sooner. Performed at [facility], [place] [address], [place], [place] [address]   Urine culture     Status: None   Collection Time: 05/26/19  4:47 PM   Specimen: Urine, Clean Catch  Result Value Ref Range Status   Specimen Description   Final    URINE, CLEAN  CATCH Performed at [facility], [place] [address], [place], [place] [address]    Special Requests   Final    NONE Performed at [facility], [place] [address], [place], [place] [address]    Culture   Final    NO GROWTH Performed at [facility], [place] [address] [place], [place] [address]    Report Status 05/28/2019 FINAL  Final     Labs: Basic Metabolic Panel: Recent Labs  Lab 05/26/19 0806 05/30/19 0751  NA 137 138  K 3.3* 3.8  CL 106 106  CO2 21* 24  GLUCOSE 105* 102*  BUN 9 8  CREATININE 0.47 0.57  CALCIUM 9.3 8.5*   Liver Function Tests: Recent Labs  Lab 05/26/19 0806  AST 28  ALT 19  ALKPHOS 62  BILITOT 5.3*  PROT 8.7*  ALBUMIN 4.8   No results for input(s): LIPASE, AMYLASE in the last 168 hours. No results for input(s): AMMONIA in the last 168 hours. CBC: Recent Labs  Lab 05/26/19 0806 05/27/19 0648 05/28/19 0655 05/30/19 0751 05/31/19 1238  WBC 20.7* 17.0*  --  15.3* 14.8*  NEUTROABS 13.7*  --   --   --   --   HGB 7.9* 6.3* 7.8* 7.6* 7.4*  HCT  23.5* 18.7* 23.4* 23.1* 22.3*  MCV 88.3 89.0  --  89.5 88.8  PLT 632* 548*  --  539* 534*   Cardiac Enzymes: No results for input(s): CKTOTAL, CKMB, CKMBINDEX, TROPONINI in the last 168 hours. BNP: Invalid input(s): POCBNP CBG: No results for input(s): GLUCAP in the last 168 hours.  Time coordinating discharge: 50 minutes  Signed:  [name]  APRN, MSN, FNP-C Patient [place] [address] [place], [place] [address] [phone]  [facility] 06/01/2019, 7:49 AM   This note was prepared using Dragon speech recognition software, errors in dictation are unintentional.

## 2019-06-01 NOTE — Discharge Instructions (Signed)
Sickle Cell Anemia, Adult ° °Sickle cell anemia is a condition in which red blood cells have an abnormal “sickle” shape. Red blood cells carry oxygen through the body. Sickle-shaped red blood cells do not live as long as normal red blood cells. They also clump together and block blood from flowing through the blood vessels. This condition prevents the body from getting enough oxygen. Sickle cell anemia causes organ damage and pain. It also increases the risk of infection. °What are the causes? °This condition is caused by a gene that is passed from parent to child (inherited). Receiving two copies of the gene causes the disease. Receiving one copy causes the "trait," which means that symptoms are milder or not present. °What increases the risk? °This condition is more likely to develop if your ancestors were from [place], the Mediterranean, South or [place], the [place], [place], or the Middle [place]. °What are the signs or symptoms? °Symptoms of this condition include: °· Episodes of pain (crises), especially in the hands and feet, joints, back, chest, or abdomen. The pain can be triggered by: °? An illness, especially if there is dehydration. °? Doing an activity with great effort (overexertion). °? Exposure to extreme temperature changes. °? High altitude. °· Fatigue. °· Shortness of breath or difficulty breathing. °· Dizziness. °· Pale skin or yellowed skin (jaundice). °· Frequent bacterial infections. °· Pain and swelling in the hands and feet (hand-food syndrome). °· Prolonged, painful erection of the penis (priapism). °· Acute chest syndrome. Symptoms of this include: °? Chest pain. °? Fever. °? Cough. °? Fast breathing. °· Stroke. °· Decreased activity. °· Loss of appetite. °· Change in behavior. °· Headaches. °· Seizures. °· Vision changes. °· Skin ulcers. °· Heart disease. °· High blood pressure. °· Gallstones. °· Liver and kidney problems. °How is this diagnosed? °This condition is diagnosed with  blood tests that check for the gene that causes this condition. °How is this treated? °There is no cure for most cases of this condition. Treatment focuses on managing your symptoms and preventing complications of the disease. Your health care provider will work with you to identify the best treatment options for you based on an assessment of your condition. Treatment may include: °· Medicines, including: °? Pain medicines. °? Antibiotic medicines for infection. °? Medicines to increase the production of a protein in red blood cells that helps carry oxygen in the body (hemoglobin). °· Fluids to treat pain and swelling. °· Oxygen to treat acute chest syndrome. °· Blood transfusions to treat symptoms such as fatigue, stroke, and acute chest syndrome. °· Massage and physical therapy for pain. °· Regular tests to monitor your condition, such as blood tests, X-rays, CT scans, MRI scans, ultrasounds, and lung function tests. These should be done every 3-12 months, depending on your age. °· Hematopoietic stem cell transplant. This is a procedure to replace abnormal stem cells with healthy stem cells from a donor's bone marrow. Stem cells are cells that can develop into blood cells, and bone marrow is the spongy tissue inside the bones. °Follow these instructions at home: °Medicines °· Take over-the-counter and prescription medicines only as told by your health care provider. °· If you were prescribed an antibiotic medicine, take it as told by your health care provider. Do not stop taking the antibiotic even if you start to feel better. °· If you develop a fever, do not take medicines to reduce the fever right away. This could cover up another problem. Notify your health care provider. °Managing   pain, stiffness, and swelling °· Try these methods to help ease your pain: °? Using a heating pad. °? Taking a warm bath. °? Distracting yourself, such as by watching TV. °Eating and drinking °· Drink enough fluid to keep your urine  clear or pale yellow. Drink more in hot weather and during exercise. °· Limit or avoid drinking alcohol. °· Eat a balanced and nutritious diet. Eat plenty of fruits, vegetables, whole grains, and lean protein. °· Take vitamins and supplements as directed by your health care provider. °Traveling °· When traveling, keep these with you: °? Your medical information. °? The names of your health care providers. °? Your medicines. °· If you have to travel by air, ask about precautions you should take. °Activity °· Get plenty of rest. °· Avoid activities that will lower your oxygen levels, such as exercising vigorously. °General instructions °· Do not use any products that contain nicotine or tobacco, such as cigarettes and e-cigarettes. They lower blood oxygen levels. If you need help quitting, ask your health care provider. °· Consider wearing a medical alert bracelet. °· Avoid high altitudes. °· Avoid extreme temperatures and extreme temperature changes. °· Keep all follow-up visits as told by your health care provider. This is important. °Contact a health care provider if: °· You develop joint pain. °· Your feet or hands swell or have pain. °· You have fatigue. °Get help right away if: °· You have symptoms of infection. These include: °? Fever. °? Chills. °? Extreme tiredness. °? Irritability. °? Poor eating. °? Vomiting. °· You feel dizzy or faint. °· You have new abdominal pain, especially on the left side near the stomach area. °· You develop priapism. °· You have numbness in your arms or legs or have trouble moving them. °· You have trouble talking. °· You develop pain that cannot be controlled with medicine. °· You become short of breath. °· You have rapid breathing. °· You have a persistent cough. °· You have pain in your chest. °· You develop a severe headache or stiff neck. °· You feel bloated without eating or after eating a small amount of food. °· Your skin is pale. °· You suddenly lose  vision. °Summary °· Sickle cell anemia is a condition in which red blood cells have an abnormal “sickle” shape. This disease can cause organ damage and chronic pain, and it can raise your risk of infection. °· Sickle cell anemia is a genetic disorder. °· Treatment focuses on managing your symptoms and preventing complications of the disease. °· Get medical help right away if you have any signs of infection, such as a fever. °This information is not intended to replace advice given to you by your health care provider. Make sure you discuss any questions you have with your health care provider. °Document Released: 09/11/2005 Document Revised: 09/25/2018 Document Reviewed: 07/09/2016 °Elsevier Patient Education © 2020 [employer] ° °

## 2019-06-08 ENCOUNTER — Encounter (HOSPITAL_COMMUNITY): Payer: Self-pay | Admitting: *Deleted

## 2019-06-08 ENCOUNTER — Other Ambulatory Visit: Payer: Self-pay

## 2019-06-08 ENCOUNTER — Emergency Department (HOSPITAL_COMMUNITY)
Admission: EM | Admit: 2019-06-08 | Discharge: 2019-06-09 | Disposition: A | Discharge status: Home / Self Care | Payer: Medicare Other | Attending: Emergency Medicine | Admitting: Emergency Medicine

## 2019-06-08 ENCOUNTER — Emergency Department (HOSPITAL_COMMUNITY): Payer: Medicare Other

## 2019-06-08 DIAGNOSIS — M546 Pain in thoracic spine: Secondary | ICD-10-CM | POA: Diagnosis present

## 2019-06-08 DIAGNOSIS — Z79899 Other long term (current) drug therapy: Secondary | ICD-10-CM | POA: Insufficient documentation

## 2019-06-08 DIAGNOSIS — D5700 Hb-SS disease with crisis, unspecified: Secondary | ICD-10-CM | POA: Insufficient documentation

## 2019-06-08 DIAGNOSIS — Z20828 Contact with and (suspected) exposure to other viral communicable diseases: Secondary | ICD-10-CM | POA: Insufficient documentation

## 2019-06-08 LAB — CBC WITH DIFFERENTIAL/PLATELET
Abs Immature Granulocytes: 0.13 10*3/uL — ABNORMAL HIGH (ref 0.00–0.07)
Basophils Absolute: 0.1 10*3/uL (ref 0.0–0.1)
Basophils Relative: 1 %
Eosinophils Absolute: 0.1 10*3/uL (ref 0.0–0.5)
Eosinophils Relative: 0 %
HCT: 26.5 % — ABNORMAL LOW (ref 36.0–46.0)
Hemoglobin: 8.6 g/dL — ABNORMAL LOW (ref 12.0–15.0)
Immature Granulocytes: 1 %
Lymphocytes Relative: 15 %
Lymphs Abs: 2.9 10*3/uL (ref 0.7–4.0)
MCH: 28.5 pg (ref 26.0–34.0)
MCHC: 32.5 g/dL (ref 30.0–36.0)
MCV: 87.7 fL (ref 80.0–100.0)
Monocytes Absolute: 2.2 10*3/uL — ABNORMAL HIGH (ref 0.1–1.0)
Monocytes Relative: 12 %
Neutro Abs: 13.6 10*3/uL — ABNORMAL HIGH (ref 1.7–7.7)
Neutrophils Relative %: 71 %
Platelets: 526 10*3/uL — ABNORMAL HIGH (ref 150–400)
RBC: 3.02 MIL/uL — ABNORMAL LOW (ref 3.87–5.11)
RDW: 18.4 % — ABNORMAL HIGH (ref 11.5–15.5)
WBC: 19.1 10*3/uL — ABNORMAL HIGH (ref 4.0–10.5)
nRBC: 0.4 % — ABNORMAL HIGH (ref 0.0–0.2)

## 2019-06-08 LAB — COMPREHENSIVE METABOLIC PANEL
ALT: 27 U/L (ref 0–44)
AST: 36 U/L (ref 15–41)
Albumin: 4.9 g/dL (ref 3.5–5.0)
Alkaline Phosphatase: 72 U/L (ref 38–126)
Anion gap: 9 (ref 5–15)
BUN: 9 mg/dL (ref 6–20)
CO2: 22 mmol/L (ref 22–32)
Calcium: 9.3 mg/dL (ref 8.9–10.3)
Chloride: 105 mmol/L (ref 98–111)
Creatinine, Ser: 0.49 mg/dL (ref 0.44–1.00)
GFR calc Af Amer: >60 mL/min (ref >60)
GFR calc non Af Amer: >60 mL/min (ref >60)
Glucose, Bld: 100 mg/dL — ABNORMAL HIGH (ref 70–99)
Potassium: 4 mmol/L (ref 3.5–5.1)
Sodium: 136 mmol/L (ref 135–145)
Total Bilirubin: 4.3 mg/dL — ABNORMAL HIGH (ref 0.3–1.2)
Total Protein: 8.8 g/dL — ABNORMAL HIGH (ref 6.5–8.1)

## 2019-06-08 LAB — RETICULOCYTES
Immature Retic Fract: 23.9 % — ABNORMAL HIGH (ref 2.3–15.9)
RBC.: 3.02 MIL/uL — ABNORMAL LOW (ref 3.87–5.11)
Retic Count, Absolute: 248.8 10*3/uL — ABNORMAL HIGH (ref 19.0–186.0)
Retic Ct Pct: 8.2 % — ABNORMAL HIGH (ref 0.4–3.1)

## 2019-06-08 LAB — I-STAT BETA HCG BLOOD, ED (MC, WL, AP ONLY): I-stat hCG, quantitative: <5 m[IU]/mL (ref <5)

## 2019-06-08 MED ORDER — KETOROLAC TROMETHAMINE 15 MG/ML IJ SOLN
15.0000 mg | Freq: Once | INTRAMUSCULAR | Status: AC
  Administered 2019-06-08: 15 mg via INTRAVENOUS
  Filled 2019-06-08: qty 1

## 2019-06-08 MED ORDER — SODIUM CHLORIDE 0.9% FLUSH
3.0000 mL | Freq: Once | INTRAVENOUS | Status: AC
  Administered 2019-06-08: 3 mL via INTRAVENOUS

## 2019-06-08 MED ORDER — DIPHENHYDRAMINE HCL 50 MG/ML IJ SOLN
12.5000 mg | Freq: Once | INTRAMUSCULAR | Status: AC
  Administered 2019-06-08: 12.5 mg via INTRAVENOUS
  Filled 2019-06-08: qty 1

## 2019-06-08 MED ORDER — PROMETHAZINE HCL 25 MG/ML IJ SOLN
12.5000 mg | Freq: Once | INTRAMUSCULAR | Status: AC
  Administered 2019-06-08: 12.5 mg via INTRAVENOUS
  Filled 2019-06-08: qty 1

## 2019-06-08 MED ORDER — HYDROMORPHONE HCL 2 MG/ML IJ SOLN
2.0000 mg | Freq: Once | INTRAMUSCULAR | Status: AC
  Administered 2019-06-08: 2 mg via INTRAVENOUS
  Filled 2019-06-08: qty 1

## 2019-06-08 MED ORDER — HYDROMORPHONE HCL 2 MG/ML IJ SOLN
2.0000 mg | INTRAMUSCULAR | Status: DC | PRN
  Administered 2019-06-09: 2 mg via INTRAVENOUS
  Filled 2019-06-08: qty 1

## 2019-06-08 NOTE — ED Triage Notes (Signed)
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

## 2019-06-08 NOTE — ED Provider Notes (Signed)
[facility] DEPT Provider Note   CSN: [id number] Arrival date & time: 06/08/19  1403     History Chief Complaint  Patient presents with  . Sickle Cell Pain Crisis    Molly Gutierrez is a 38 y.o. female.  HPI   38 year old female with back pain.  Pain is in the mid back.  Onset earlier today.  Denies any trauma or strain.  She has a past history of sickle cell anemia.  She suspects her pain is from this.  She has been taking her home medications without much improvement.  No acute respiratory complaints.  No fevers or chills.  No urinary complaints.  Past Medical History:  Diagnosis Date  . Asthma   . Eczema   . Sickle cell anemia [facility])     Patient Active Problem List   Diagnosis Date Noted  . Hypokalemia 05/05/2019  . Thrombocytosis ([place]) 05/05/2019  . Breast mass in female   . Sickle cell pain crisis ([place]) 04/19/2019  . Leukocytosis 04/19/2019  . Chronic pain syndrome 04/19/2019  . History of pulmonary embolism 04/19/2019    Past Surgical History:  Procedure Laterality Date  . CHOLECYSTECTOMY    . ERCP    . JOINT REPLACEMENT    . PORTA CATH INSERTION    . TUBAL LIGATION    . WISDOM TOOTH EXTRACTION       OB History   No obstetric history on file.     Family History  Problem Relation Age of Onset  . Renal Disease Mother   . Hypertension Mother   . High Cholesterol Mother     Social History   Tobacco Use  . Smoking status: Never Smoker  . Smokeless tobacco: Never Used  Substance Use Topics  . Alcohol use: Never  . Drug use: Never    Home Medications Prior to Admission medications   Medication Sig Start Date End Date Taking? Authorizing Provider  albuterol (VENTOLIN HFA) 108 (90 Base) MCG/ACT inhaler Inhale 2 puffs into the lungs 2 (two) times daily as needed for wheezing or shortness of breath. 12/31/16  Yes [provider]  Cholecalciferol (VITAMIN D3) 25 MCG (1000 UT) CAPS Take 1,000 Units by mouth  daily.   Yes [provider]  Deferasirox 360 MG TABS Take 1,080 mg by mouth daily. 09/27/16  Yes [provider]  diphenhydrAMINE (BENADRYL) 25 mg capsule Take 25 mg by mouth 3 (three) times daily as needed for itching.   Yes [provider]  folic acid (FOLVITE) 1 MG tablet Take 1 mg by mouth daily. 12/31/16  Yes [provider]  HYDROmorphone (DILAUDID) 4 MG tablet Take 1 tablet (4 mg total) by mouth every 6 (six) hours as needed for severe pain. 06/01/19  Yes [name], FNP  mirtazapine (REMERON) 45 MG tablet Take 45 mg by mouth at bedtime. 09/30/18  Yes [provider]  mometasone-formoterol (DULERA) 100-5 MCG/ACT AERO Inhale 2 puffs into the lungs daily as needed for wheezing or shortness of breath.   Yes [provider]  omeprazole (PRILOSEC) 20 MG capsule Take 20 mg by mouth 2 (two) times a day. 09/08/18  Yes [provider]  vitamin B-12 (CYANOCOBALAMIN) 1000 MCG tablet Take 1,000 mcg by mouth daily.   Yes [provider]  voxelotor (OXBRYTA) 500 MG TABS tablet Take 1,500 mg by mouth daily.   Yes [provider]  XARELTO 20 MG TABS tablet Take 1 tablet (20 mg total) by mouth daily. 05/14/19  Yes [name], MD    Allergies    Cefaclor and Hydroxyurea  Review of Systems   Review of Systems All systems reviewed and negative, other than as noted in HPI. Physical Exam Updated Vital Signs BP (!) 116/91 (BP Location: Left Arm)   Pulse (!) 107   Temp 99.9 F (37.7 C) (Oral)   Resp 18   Ht 5\' 3"  (1.6 m)   Wt 54.4 kg   SpO2 97%   BMI 21.26 kg/m   Physical Exam Vitals and nursing note reviewed.  Constitutional:      General: She is not in acute distress.    Appearance: She is well-developed.     Comments: Laying in bed.  Appears uncomfortable.  Crying.  HENT:     Head: Normocephalic and atraumatic.  Eyes:     General:        Right eye: No discharge.        Left eye: No discharge.       Conjunctiva/sclera: Conjunctivae normal.  Cardiovascular:     Rate and Rhythm: Regular rhythm. Tachycardia present.     Heart sounds: Normal heart sounds. No murmur. No friction rub. No gallop.   Pulmonary:     Effort: Pulmonary effort is normal. No respiratory distress.     Breath sounds: Normal breath sounds.  Abdominal:     General: There is no distension.     Palpations: Abdomen is soft.     Tenderness: There is no abdominal tenderness.  Musculoskeletal:        General: No tenderness.     Cervical back: Neck supple.     Comments: Back is grossly normal in appearance.  Diffuse tenderness palpation both in the midline and paraspinally in the mid to lower thoracic back.  Skin:    General: Skin is warm and dry.  Neurological:     Mental Status: She is alert.     Comments: Strength/sensation normal bilateral lower extremities.  Psychiatric:        Behavior: Behavior normal.        Thought Content: Thought content normal.     ED Results / Procedures / Treatments   Labs (all labs ordered are listed, but only abnormal results are displayed) Labs Reviewed  COMPREHENSIVE METABOLIC PANEL - Abnormal; Notable for the following components:      Result Value   Glucose, Bld 100 (*)    Total Protein 8.8 (*)    Total Bilirubin 4.3 (*)    All other components within normal limits  CBC WITH DIFFERENTIAL/PLATELET - Abnormal; Notable for the following components:   WBC 19.1 (*)    RBC 3.02 (*)    Hemoglobin 8.6 (*)    HCT 26.5 (*)    RDW 18.4 (*)    Platelets 526 (*)    nRBC 0.4 (*)    Neutro Abs 13.6 (*)    Monocytes Absolute 2.2 (*)    Abs Immature Granulocytes 0.13 (*)    All other components within normal limits  RETICULOCYTES - Abnormal; Notable for the following components:   Retic Ct Pct 8.2 (*)    RBC. 3.02 (*)    Retic Count, Absolute 248.8 (*)    Immature Retic Fract 23.9 (*)    All other components within normal limits  SARS CORONAVIRUS 2 (TAT 6-24 HRS)   URINALYSIS, ROUTINE W REFLEX MICROSCOPIC  I-STAT BETA HCG BLOOD, ED ([facility], [facility], [facility] ONLY)  I-STAT BETA HCG BLOOD, ED ([facility], [facility], [facility] ONLY)  EKG None  Radiology DG Chest Portable 1 View  Result Date: 06/08/2019 CLINICAL DATA:  Upper back pain and cough, history of sickle cell in the EXAM: PORTABLE CHEST 1 VIEW COMPARISON:  Radiograph 05/04/2019, CT 04/19/2019 FINDINGS: Bilateral left and right IJ approach Port-A-Cath remain in stable position comparison exam. No consolidation, features of edema, pneumothorax, or effusion. The cardiomediastinal contours are unremarkable. No acute osseous or soft tissue abnormality. Cholecystectomy clips in the right upper quadrant. IMPRESSION: No acute cardiopulmonary abnormality. Electronically Signed   By: Lovena Le M.D.   On: 06/08/2019 22:12    Procedures Procedures (including critical care time)  Medications Ordered in ED Medications  sodium chloride flush (NS) 0.9 % injection 3 mL (has no administration in time range)  ketorolac (TORADOL) 15 MG/ML injection 15 mg (has no administration in time range)  HYDROmorphone (DILAUDID) injection 2 mg (has no administration in time range)  promethazine (PHENERGAN) injection 12.5 mg (has no administration in time range)  diphenhydrAMINE (BENADRYL) injection 12.5 mg (has no administration in time range)    ED Course  I have reviewed the triage vital signs and the nursing notes.  Pertinent labs & imaging results that were available during my care of the patient were reviewed by me and considered in my medical decision making (see chart for details).    MDM Rules/Calculators/A&P                     38 year old female with back pain.  History of sickle cell anemia.  She attributes her symptoms to this.  Labs appear to be fairly close to her baseline.  Leukocytosis of uncertain significance.  Recent labs show a leukocytosis of some degree.  Temperature 99.9.  She did not specifically mention that she felt  febrile though.  Mild tachycardia.  Pain related?  Denies any chest pain.  No acute respiratory symptoms.  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  She reports compliance with her medications.  I do not have a strong suspicion for emergent complication of sickle cell anemia or otherwise.  Plan at this point is for pain control and reassessment.  Care signed out to oncoming provider.  Final Clinical Impression(s) / ED Diagnoses Final diagnoses:  Sickle cell pain crisis [facility])    Rx / DC Orders ED Discharge Orders    None       Virgel Manifold, MD 06/09/19 [phone]

## 2019-06-09 DIAGNOSIS — D5700 Hb-SS disease with crisis, unspecified: Secondary | ICD-10-CM | POA: Diagnosis not present

## 2019-06-09 LAB — URINALYSIS, ROUTINE W REFLEX MICROSCOPIC
Bilirubin Urine: NEGATIVE
Glucose, UA: NEGATIVE mg/dL
Hgb urine dipstick: NEGATIVE
Ketones, ur: NEGATIVE mg/dL
Leukocytes,Ua: NEGATIVE
Nitrite: NEGATIVE
Protein, ur: NEGATIVE mg/dL
Specific Gravity, Urine: 1.012 (ref 1.005–1.030)
pH: 6 (ref 5.0–8.0)

## 2019-06-09 LAB — SARS CORONAVIRUS 2 (TAT 6-24 HRS): SARS Coronavirus 2: NEGATIVE

## 2019-06-09 MED ORDER — HYDROMORPHONE HCL 2 MG PO TABS
4.0000 mg | ORAL_TABLET | Freq: Once | ORAL | Status: AC
  Administered 2019-06-09: 4 mg via ORAL
  Filled 2019-06-09: qty 2

## 2019-06-09 MED ORDER — SODIUM CHLORIDE 0.9 % IV BOLUS
1000.0000 mL | Freq: Once | INTRAVENOUS | Status: AC
  Administered 2019-06-09: 1000 mL via INTRAVENOUS

## 2019-06-09 MED ORDER — DIPHENHYDRAMINE HCL 50 MG/ML IJ SOLN
25.0000 mg | Freq: Once | INTRAMUSCULAR | Status: AC
  Administered 2019-06-09: 25 mg via INTRAVENOUS
  Filled 2019-06-09: qty 1

## 2019-06-09 MED ORDER — ACETAMINOPHEN 325 MG PO TABS
650.0000 mg | ORAL_TABLET | Freq: Once | ORAL | Status: AC
  Administered 2019-06-09: 650 mg via ORAL
  Filled 2019-06-09: qty 2

## 2019-06-09 MED ORDER — HEPARIN SOD (PORK) LOCK FLUSH 100 UNIT/ML IV SOLN
500.0000 [IU] | Freq: Once | INTRAVENOUS | Status: DC
  Filled 2019-06-09: qty 5

## 2019-06-09 NOTE — ED Provider Notes (Signed)
I assumed care of this patient from Dr. Wilson Singer.  Please see their note for further details of Hx, PE.  Briefly patient is a 38 y.o. female who presented mid back pain consistent with SSC pain. AFVSS. Labs with hemoconcentration, otherwise at baseline. Pending UA and reassessment. After 2 doses of pain meds, patient was found sleeping comfortably.  UA negative.  Given oral home dilaudid.  The patient appears reasonably screened and/or stabilized for discharge and I doubt any other medical condition or other [facility] requiring further screening, evaluation, or treatment in the ED at this time prior to discharge.  The patient is safe for discharge with strict return precautions.   The patient appears reasonably screened and/or stabilized for discharge and I doubt any other medical condition or other [facility] requiring further screening, evaluation, or treatment in the ED at this time prior to discharge.  Disposition: Discharge  Condition: Good  I have discussed the results, Dx and Tx plan with the patient who expressed understanding and agree(s) with the plan. Discharge instructions discussed at great length. The patient was given strict return precautions who verbalized understanding of the instructions. No further questions at time of discharge.    ED Discharge Orders    None            Molly Gutierrez, Grayce Sessions, MD 06/09/19 [phone]

## 2019-06-09 NOTE — ED Notes (Signed)
Pt denies CP or SOB

## 2019-06-09 NOTE — ED Notes (Signed)
Pt ambulated to RR with minor assistance.  Pt was able to provide a urine sample and sample was sent to lab.

## 2019-06-22 ENCOUNTER — Other Ambulatory Visit: Payer: Self-pay

## 2019-06-22 ENCOUNTER — Encounter (HOSPITAL_COMMUNITY): Payer: Self-pay

## 2019-06-22 DIAGNOSIS — R0789 Other chest pain: Secondary | ICD-10-CM | POA: Diagnosis present

## 2019-06-22 DIAGNOSIS — J45909 Unspecified asthma, uncomplicated: Secondary | ICD-10-CM | POA: Diagnosis not present

## 2019-06-22 DIAGNOSIS — D57819 Other sickle-cell disorders with crisis, unspecified: Secondary | ICD-10-CM | POA: Diagnosis not present

## 2019-06-22 DIAGNOSIS — Z79899 Other long term (current) drug therapy: Secondary | ICD-10-CM | POA: Insufficient documentation

## 2019-06-22 MED ORDER — LIDOCAINE HCL 1 % IJ SOLN
0.50 | INTRAMUSCULAR | Status: DC
Start: ? — End: 2019-06-22

## 2019-06-22 MED ORDER — SODIUM CHLORIDE 0.9% FLUSH
3.0000 mL | Freq: Once | INTRAVENOUS | Status: AC
  Administered 2019-06-23: 3 mL via INTRAVENOUS

## 2019-06-22 MED ORDER — DICLOFENAC EPOLAMINE 1.3 % EX PTCH
1.00 | MEDICATED_PATCH | CUTANEOUS | Status: DC
Start: ? — End: 2019-06-22

## 2019-06-22 MED ORDER — PANTOPRAZOLE SODIUM 40 MG PO TBEC
40.00 | DELAYED_RELEASE_TABLET | ORAL | Status: DC
Start: 2019-06-23 — End: 2019-06-22

## 2019-06-22 MED ORDER — HYDROMORPHONE HCL 4 MG PO TABS
4.00 | ORAL_TABLET | ORAL | Status: DC
Start: ? — End: 2019-06-22

## 2019-06-22 MED ORDER — GENERIC EXTERNAL MEDICATION
5.00 | Status: DC
Start: 2019-06-22 — End: 2019-06-22

## 2019-06-22 MED ORDER — CHOLECALCIFEROL 25 MCG (1000 UT) PO TABS
2000.00 | ORAL_TABLET | ORAL | Status: DC
Start: 2019-06-23 — End: 2019-06-22

## 2019-06-22 MED ORDER — ACETAMINOPHEN 325 MG PO TABS
975.00 | ORAL_TABLET | ORAL | Status: DC
Start: 2019-06-22 — End: 2019-06-22

## 2019-06-22 MED ORDER — MOMETASONE FURO-FORMOTEROL FUM 100-5 MCG/ACT IN AERO
2.00 | INHALATION_SPRAY | RESPIRATORY_TRACT | Status: DC
Start: 2019-06-22 — End: 2019-06-22

## 2019-06-22 MED ORDER — FOLIC ACID 1 MG PO TABS
1.00 | ORAL_TABLET | ORAL | Status: DC
Start: 2019-06-23 — End: 2019-06-22

## 2019-06-22 MED ORDER — GENERIC EXTERNAL MEDICATION
Status: DC
Start: ? — End: 2019-06-22

## 2019-06-22 MED ORDER — RIVAROXABAN 20 MG PO TABS
20.00 | ORAL_TABLET | ORAL | Status: DC
Start: 2019-06-23 — End: 2019-06-22

## 2019-06-22 MED ORDER — SENNOSIDES-DOCUSATE SODIUM 8.6-50 MG PO TABS
2.00 | ORAL_TABLET | ORAL | Status: DC
Start: 2019-06-22 — End: 2019-06-22

## 2019-06-22 MED ORDER — HYDROMORPHONE HCL 1 MG/ML IJ SOLN
0.5000 mg | Freq: Once | INTRAMUSCULAR | Status: AC
  Administered 2019-06-22: 0.5 mg via SUBCUTANEOUS
  Filled 2019-06-22: qty 1

## 2019-06-22 MED ORDER — POLYETHYLENE GLYCOL 3350 17 GM/SCOOP PO POWD
17.00 | ORAL | Status: DC
Start: ? — End: 2019-06-22

## 2019-06-22 MED ORDER — CYANOCOBALAMIN 500 MCG PO TABS
1000.00 | ORAL_TABLET | ORAL | Status: DC
Start: 2019-06-23 — End: 2019-06-22

## 2019-06-22 MED ORDER — EUCERIN EX CREA
TOPICAL_CREAM | CUTANEOUS | Status: DC
Start: ? — End: 2019-06-22

## 2019-06-22 MED ORDER — ALBUTEROL SULFATE HFA 108 (90 BASE) MCG/ACT IN AERS
2.00 | INHALATION_SPRAY | RESPIRATORY_TRACT | Status: DC
Start: ? — End: 2019-06-22

## 2019-06-22 MED ORDER — LIDOCAINE 5 % EX PTCH
1.00 | MEDICATED_PATCH | CUTANEOUS | Status: DC
Start: ? — End: 2019-06-22

## 2019-06-22 MED ORDER — DEFERASIROX 360 MG PO TABS
1080.00 | ORAL_TABLET | ORAL | Status: DC
Start: 2019-06-23 — End: 2019-06-22

## 2019-06-22 MED ORDER — MIRTAZAPINE 30 MG PO TABS
15.00 | ORAL_TABLET | ORAL | Status: DC
Start: 2019-06-22 — End: 2019-06-22

## 2019-06-22 NOTE — ED Triage Notes (Signed)
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 Pt reports this is normally where she has [place] pain.

## 2019-06-22 NOTE — ED Notes (Signed)
Pt refusing blood draw until she gets to the back

## 2019-06-22 NOTE — ED Notes (Signed)
Rounding in lobby: pt requests pain meds while she waits Pt informed that I would ask the Triage RN V/S taken as noted Pt ambulatory to vending machine and back to seat with steady gait and without assistance Triage RN Jenn informed of same

## 2019-06-23 ENCOUNTER — Emergency Department (HOSPITAL_COMMUNITY)
Admission: EM | Admit: 2019-06-23 | Discharge: 2019-06-23 | Disposition: A | Discharge status: Home / Self Care | Payer: Medicare Other | Attending: Emergency Medicine | Admitting: Emergency Medicine

## 2019-06-23 ENCOUNTER — Emergency Department (HOSPITAL_COMMUNITY): Payer: Medicare Other

## 2019-06-23 DIAGNOSIS — D57819 Other sickle-cell disorders with crisis, unspecified: Secondary | ICD-10-CM | POA: Diagnosis not present

## 2019-06-23 DIAGNOSIS — D5700 Hb-SS disease with crisis, unspecified: Secondary | ICD-10-CM

## 2019-06-23 LAB — COMPREHENSIVE METABOLIC PANEL
ALT: 27 U/L (ref 0–44)
AST: 46 U/L — ABNORMAL HIGH (ref 15–41)
Albumin: 4.4 g/dL (ref 3.5–5.0)
Alkaline Phosphatase: 93 U/L (ref 38–126)
Anion gap: 11 (ref 5–15)
BUN: 9 mg/dL (ref 6–20)
CO2: 24 mmol/L (ref 22–32)
Calcium: 9.2 mg/dL (ref 8.9–10.3)
Chloride: 103 mmol/L (ref 98–111)
Creatinine, Ser: 0.44 mg/dL (ref 0.44–1.00)
GFR calc Af Amer: >60 mL/min (ref >60)
GFR calc non Af Amer: >60 mL/min (ref >60)
Glucose, Bld: 112 mg/dL — ABNORMAL HIGH (ref 70–99)
Potassium: 3.7 mmol/L (ref 3.5–5.1)
Sodium: 138 mmol/L (ref 135–145)
Total Bilirubin: 2.9 mg/dL — ABNORMAL HIGH (ref 0.3–1.2)
Total Protein: 8.2 g/dL — ABNORMAL HIGH (ref 6.5–8.1)

## 2019-06-23 LAB — CBC WITH DIFFERENTIAL/PLATELET
Abs Immature Granulocytes: 0.17 10*3/uL — ABNORMAL HIGH (ref 0.00–0.07)
Basophils Absolute: 0.1 10*3/uL (ref 0.0–0.1)
Basophils Relative: 1 %
Eosinophils Absolute: 0.6 10*3/uL — ABNORMAL HIGH (ref 0.0–0.5)
Eosinophils Relative: 3 %
HCT: 23.9 % — ABNORMAL LOW (ref 36.0–46.0)
Hemoglobin: 8 g/dL — ABNORMAL LOW (ref 12.0–15.0)
Immature Granulocytes: 1 %
Lymphocytes Relative: 17 %
Lymphs Abs: 3.6 10*3/uL (ref 0.7–4.0)
MCH: 29.1 pg (ref 26.0–34.0)
MCHC: 33.5 g/dL (ref 30.0–36.0)
MCV: 86.9 fL (ref 80.0–100.0)
Monocytes Absolute: 2.6 10*3/uL — ABNORMAL HIGH (ref 0.1–1.0)
Monocytes Relative: 12 %
Neutro Abs: 13.6 10*3/uL — ABNORMAL HIGH (ref 1.7–7.7)
Neutrophils Relative %: 66 %
Platelets: 587 10*3/uL — ABNORMAL HIGH (ref 150–400)
RBC: 2.75 MIL/uL — ABNORMAL LOW (ref 3.87–5.11)
RDW: 20.5 % — ABNORMAL HIGH (ref 11.5–15.5)
WBC: 20.6 10*3/uL — ABNORMAL HIGH (ref 4.0–10.5)
nRBC: 0.3 % — ABNORMAL HIGH (ref 0.0–0.2)

## 2019-06-23 LAB — RETICULOCYTES
Immature Retic Fract: 29.9 % — ABNORMAL HIGH (ref 2.3–15.9)
RBC.: 2.76 MIL/uL — ABNORMAL LOW (ref 3.87–5.11)
Retic Count, Absolute: 281.2 10*3/uL — ABNORMAL HIGH (ref 19.0–186.0)
Retic Ct Pct: 10.2 % — ABNORMAL HIGH (ref 0.4–3.1)

## 2019-06-23 LAB — I-STAT BETA HCG BLOOD, ED (MC, WL, AP ONLY): I-stat hCG, quantitative: <5 m[IU]/mL (ref <5)

## 2019-06-23 MED ORDER — PROMETHAZINE HCL 25 MG/ML IJ SOLN
12.5000 mg | Freq: Once | INTRAMUSCULAR | Status: AC
  Administered 2019-06-23: 12.5 mg via INTRAVENOUS
  Filled 2019-06-23: qty 1

## 2019-06-23 MED ORDER — HYDROMORPHONE HCL 1 MG/ML IJ SOLN
1.0000 mg | INTRAMUSCULAR | Status: AC
  Administered 2019-06-23 (×3): 1 mg via INTRAVENOUS
  Filled 2019-06-23 (×3): qty 1

## 2019-06-23 MED ORDER — KETOROLAC TROMETHAMINE 30 MG/ML IJ SOLN
30.0000 mg | INTRAMUSCULAR | Status: AC
  Administered 2019-06-23: 30 mg via INTRAVENOUS
  Filled 2019-06-23: qty 1

## 2019-06-23 MED ORDER — GENERIC EXTERNAL MEDICATION
Status: DC
Start: ? — End: 2019-06-23

## 2019-06-23 MED ORDER — HEPARIN SOD (PORK) LOCK FLUSH 100 UNIT/ML IV SOLN
500.0000 [IU] | Freq: Once | INTRAVENOUS | Status: AC
  Administered 2019-06-23: 500 [IU]
  Filled 2019-06-23: qty 5

## 2019-06-23 MED ORDER — DEXTROSE-NACL 5-0.45 % IV SOLN: INTRAVENOUS | Status: DC

## 2019-06-23 MED ORDER — ONDANSETRON HCL 4 MG/2ML IJ SOLN
4.0000 mg | INTRAMUSCULAR | Status: DC | PRN
  Administered 2019-06-23: 4 mg via INTRAVENOUS
  Filled 2019-06-23 (×2): qty 2

## 2019-06-23 MED ORDER — DIPHENHYDRAMINE HCL 25 MG PO CAPS
25.0000 mg | ORAL_CAPSULE | Freq: Four times a day (QID) | ORAL | Status: DC | PRN
  Administered 2019-06-23: 25 mg via ORAL
  Filled 2019-06-23 (×3): qty 1

## 2019-06-23 MED ORDER — HYDROMORPHONE HCL 2 MG/ML IJ SOLN
2.0000 mg | Freq: Once | INTRAMUSCULAR | Status: DC
  Filled 2019-06-23: qty 1

## 2019-06-23 NOTE — ED Notes (Signed)
Pt said she does not want dilaudid administered IM. She wants it IV. Also, she wanted benadryl IV. I told her it was ordered to be administered orally. Messaged provider.

## 2019-06-23 NOTE — ED Provider Notes (Signed)
[place] DEPT Provider Note   CSN: [id number] Arrival date & time: 06/22/19  1723     History Chief Complaint  Patient presents with  . Sickle Cell Pain Crisis    Molly Gutierrez is a 39 y.o. female.   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  Her pain is located in her central chest.  It is nonradiating.  Has been constant since yesterday and is described as an aching, tightening sensation.  She has been taking her home oral Dilaudid without pain relief.  Did have some nausea and vomiting prior to arrival which is typical of her acute crisis.  Denies fever, shortness of breath, abdominal pain, bowel changes.  The history is provided by the patient. No language interpreter was used.  Sickle Cell Pain Crisis      Past Medical History:  Diagnosis Date  . Asthma   . Eczema   . Sickle cell anemia [facility])     Patient Active Problem List   Diagnosis Date Noted  . Hypokalemia 05/05/2019  . Thrombocytosis ([place]) 05/05/2019  . Breast mass in female   . Sickle cell pain crisis ([place]) 04/19/2019  . Leukocytosis 04/19/2019  . Chronic pain syndrome 04/19/2019  . History of pulmonary embolism 04/19/2019    Past Surgical History:  Procedure Laterality Date  . CHOLECYSTECTOMY    . ERCP    . JOINT REPLACEMENT    . PORTA CATH INSERTION    . TUBAL LIGATION    . WISDOM TOOTH EXTRACTION       OB History   No obstetric history on file.     Family History  Problem Relation Age of Onset  . Renal Disease Mother   . Hypertension Mother   . High Cholesterol Mother     Social History   Tobacco Use  . Smoking status: Never Smoker  . Smokeless tobacco: Never Used  Substance Use Topics  . Alcohol use: Never  . Drug use: Never    Home Medications Prior to Admission medications   Medication Sig Start Date End Date  Taking? Authorizing Provider  albuterol (VENTOLIN HFA) 108 (90 Base) MCG/ACT inhaler Inhale 2 puffs into the lungs 2 (two) times daily as needed for wheezing or shortness of breath. 12/31/16  Yes [provider]  Cholecalciferol (VITAMIN D3) 25 MCG (1000 UT) CAPS Take 1,000 Units by mouth daily.   Yes [provider]  Deferasirox 360 MG TABS Take 1,080 mg by mouth daily. 09/27/16  Yes [provider]  diphenhydrAMINE (BENADRYL) 25 mg capsule Take 25 mg by mouth 3 (three) times daily as needed for itching.   Yes [provider]  folic acid (FOLVITE) 1 MG tablet Take 1 mg by mouth daily. 12/31/16  Yes [provider]  HYDROmorphone (DILAUDID) 4 MG tablet Take 1 tablet (4 mg total) by mouth every 6 (six) hours as needed for severe pain. 06/01/19  Yes [name], FNP  mirtazapine (REMERON) 45 MG tablet Take 45 mg by mouth at bedtime. 09/30/18  Yes [provider]  mometasone-formoterol (DULERA) 100-5 MCG/ACT AERO Inhale 2 puffs into the lungs daily as needed for wheezing or shortness of breath.   Yes [provider]  omeprazole (PRILOSEC) 20 MG capsule Take 20 mg by mouth 2 (two) times a day. 09/08/18  Yes [provider]  promethazine (PHENERGAN) 25 MG tablet Take  25 mg by mouth every 8 (eight) hours as needed for nausea/vomiting. 06/22/19  Yes [provider]  vitamin B-12 (CYANOCOBALAMIN) 1000 MCG tablet Take 1,000 mcg by mouth daily.   Yes [provider]  voxelotor (OXBRYTA) 500 MG TABS tablet Take 1,500 mg by mouth daily.   Yes [provider]  XARELTO 20 MG TABS tablet Take 1 tablet (20 mg total) by mouth daily. 05/14/19  Yes [name], MD    Allergies    Cefaclor and Hydroxyurea  Review of Systems   Review of Systems  Ten systems reviewed and are negative for acute change, except as noted in the HPI.    Physical Exam Updated Vital Signs BP 103/73   Pulse 90   Temp 99.4 F  (37.4 C) (Oral)   Resp 11   Ht 5\' 3"  (1.6 m)   Wt 55.8 kg   LMP  (LMP Unknown)   SpO2 98%   BMI 21.79 kg/m   Physical Exam Vitals and nursing note reviewed.  Constitutional:      General: She is not in acute distress.    Appearance: She is well-developed. She is not diaphoretic.     Comments: Nontoxic appearing. Tearful.   HENT:     Head: Normocephalic and atraumatic.  Eyes:     General: No scleral icterus.    Conjunctiva/sclera: Conjunctivae normal.  Cardiovascular:     Rate and Rhythm: Normal rate and regular rhythm.     Pulses: Normal pulses.     Comments: HR in the 90's. Pulmonary:     Effort: Pulmonary effort is normal. No respiratory distress.     Breath sounds: No stridor. No wheezing.     Comments: Respirations even and unlabored Musculoskeletal:        General: Normal range of motion.     Cervical back: Normal range of motion.  Skin:    General: Skin is warm and dry.     Coloration: Skin is not pale.     Findings: No erythema or rash.  Neurological:     Mental Status: She is alert and oriented to person, place, and time.     Coordination: Coordination normal.     Comments: Moving all extremities spontaneously.  Psychiatric:        Mood and Affect: Affect is tearful.        Behavior: Behavior is cooperative.     ED Results / Procedures / Treatments   Labs (all labs ordered are listed, but only abnormal results are displayed) Labs Reviewed  COMPREHENSIVE METABOLIC PANEL - Abnormal; Notable for the following components:      Result Value   Glucose, Bld 112 (*)    Total Protein 8.2 (*)    AST 46 (*)    Total Bilirubin 2.9 (*)    All other components within normal limits  CBC WITH DIFFERENTIAL/PLATELET - Abnormal; Notable for the following components:   WBC 20.6 (*)    RBC 2.75 (*)    Hemoglobin 8.0 (*)    HCT 23.9 (*)    RDW 20.5 (*)    Platelets 587 (*)    nRBC 0.3 (*)    Neutro Abs 13.6 (*)    Monocytes Absolute 2.6 (*)    Eosinophils Absolute  0.6 (*)    Abs Immature Granulocytes 0.17 (*)    All other components within normal limits  RETICULOCYTES - Abnormal; Notable for the following components:   Retic Ct Pct 10.2 (*)    RBC.  2.76 (*)    Retic Count, Absolute 281.2 (*)    Immature Retic Fract 29.9 (*)    All other components within normal limits  I-STAT BETA HCG BLOOD, ED ([facility], [facility], [facility] ONLY)    EKG EKG Interpretation  Date/Time:  Tuesday June 22 2019 17:59:11 EST Ventricular Rate:  102 PR Interval:  128 QRS Duration: 74 QT Interval:  338 QTC Calculation: 440 R Axis:   45 Text Interpretation: Sinus tachycardia Nonspecific T wave abnormality Abnormal ECG No significant change since last tracing Confirmed by Ripley Fraise [phone]) on 06/23/2019 4:59:00 AM   Radiology DG Chest Port 1 View  Result Date: 06/23/2019 CLINICAL DATA:  Sickle cell pain crisis EXAM: PORTABLE CHEST 1 VIEW COMPARISON:  06/08/2019 FINDINGS: Chronic interstitial coarsening. There is no edema, consolidation, effusion, or pneumothorax. Port from bilateral approach with tips in good position about the upper cavoatrial junction. Normal heart size and mediastinal contours. IMPRESSION: No evidence of active disease. Electronically Signed   By: Monte Fantasia M.D.   On: 06/23/2019 05:37    Procedures Procedures (including critical care time)  Medications Ordered in ED Medications  dextrose 5 %-0.45 % sodium chloride infusion ( Intravenous New Bag/Given 06/23/19 0538)  diphenhydrAMINE (BENADRYL) capsule 25-50 mg (25 mg Oral Given 06/23/19 0516)  ondansetron (ZOFRAN) injection 4 mg (4 mg Intravenous Given 06/23/19 0512)  HYDROmorphone (DILAUDID) injection 1 mg (1 mg Intravenous Given 06/23/19 0539)  sodium chloride flush (NS) 0.9 % injection 3 mL (3 mLs Intravenous Given 06/23/19 0520)  HYDROmorphone (DILAUDID) injection 0.5 mg (0.5 mg Subcutaneous Given 06/22/19 2216)  ketorolac (TORADOL) 30 MG/ML injection 30 mg (30 mg Intravenous Given 06/23/19 [id number])    ED  Course  I have reviewed the triage vital signs and the nursing notes.  Pertinent labs & imaging results that were available during my care of the patient were reviewed by me and considered in my medical decision making (see chart for details).    MDM Rules/Calculators/A&P                       39 year old female presents to the emergency department for pain consistent with sickle cell crisis.  She reports chest pain without shortness of breath.  No evidence of acute chest syndrome on chest x-ray today.  She has a leukocytosis at baseline with crisis presentation.  This is largely unchanged.  Hemoglobin stable.  No significant electrolyte derangements.  Plan for pain control with IV pain medication, Toradol.  She has been given Zofran for nausea as well as oral Benadryl for itching.  Plan for repeat assessment following 3rd dose of IV pain meds.  Patient signed out to Nyoka Cowden, PA-C at shift change who will assume care and disposition appropriately.   Vitals:   06/23/19 0429 06/23/19 0430 06/23/19 0436 06/23/19 0516  BP: 127/71 108/69  103/73  Pulse: (!) 106 (!) 112  90  Resp: 17 (!) 28  11  Temp:      TempSrc:      SpO2: 97% 96%  98%  Weight:   55.8 kg   Height:   5\' 3"  (1.6 m)     Final Clinical Impression(s) / ED Diagnoses Final diagnoses:  Sickle-cell disease with pain [facility])    Rx / [place] Orders ED Discharge Orders    None       Antonietta Breach, PA-C 06/23/19 [id number]    Ripley Fraise, MD 06/23/19 [phone]

## 2019-06-23 NOTE — Discharge Instructions (Signed)
Please continue to take your medications, as prescribed.  Return to the ED or seek medical attention for any new or worsening symptoms.

## 2019-06-23 NOTE — ED Provider Notes (Signed)
Received patient as a handoff from [employer], PA-C at shift change.  History obtained by hand-off provider:  [name] is a 39 y.o. female.  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  Her pain is located in her central chest.  It is nonradiating.  Has been constant since yesterday and is described as an aching, tightening sensation.  She has been taking her home oral Dilaudid without pain relief.  Did have some nausea and vomiting prior to arrival which is typical of her acute crisis.  Denies fever, shortness of breath, abdominal pain, bowel changes.  The history is provided by the patient. No language interpreter was used.  Sickle Cell Pain Crisis  Physical Exam  BP 107/77   Pulse 94   Temp 99.4 F (37.4 C) (Oral)   Resp 14   Ht 5\' 3"  (1.6 m)   Wt 55.8 kg   LMP  (LMP Unknown)   SpO2 97%   BMI 21.79 kg/m   Physical Exam Vitals and nursing note reviewed. Exam conducted with a chaperone present.  Constitutional:      Appearance: Normal appearance.     Comments: Itching.  HENT:     Head: Normocephalic and atraumatic.  Eyes:     General: No scleral icterus.    Extraocular Movements: Extraocular movements intact.     Conjunctiva/sclera: Conjunctivae normal.     Pupils: Pupils are equal, round, and reactive to light.  Cardiovascular:     Rate and Rhythm: Normal rate and regular rhythm.     Pulses: Normal pulses.     Heart sounds: Normal heart sounds.  Pulmonary:     Effort: Pulmonary effort is normal.     Breath sounds: Normal breath sounds.  Abdominal:     General: Abdomen is flat. There is no distension.     Palpations: Abdomen is soft.     Tenderness: There is no abdominal tenderness. There is no guarding.  Musculoskeletal:     Cervical back: Normal range of motion and neck supple. No rigidity.     Right lower leg: No edema.     Left  lower leg: No edema.  Skin:    General: Skin is dry.  Neurological:     Mental Status: She is alert and oriented to person, place, and time.     GCS: GCS eye subscore is 4. GCS verbal subscore is 5. GCS motor subscore is 6.  Psychiatric:        Mood and Affect: Mood normal.        Behavior: Behavior normal.        Thought Content: Thought content normal.       ED Course/Procedures     Procedures  Results for orders placed or performed during the hospital encounter of 06/23/19  Comprehensive metabolic panel  Result Value Ref Range   Sodium 138 135 - 145 mmol/L   Potassium 3.7 3.5 - 5.1 mmol/L   Chloride 103 98 - 111 mmol/L   CO2 24 22 - 32 mmol/L   Glucose, Bld 112 (H) 70 - 99 mg/dL   BUN 9 6 - 20 mg/dL   Creatinine, Ser 0.44 0.44 - 1.00 mg/dL   Calcium 9.2 8.9 - 10.3 mg/dL   Total Protein 8.2 (H) 6.5 - 8.1 g/dL   Albumin 4.4 3.5 - 5.0 g/dL   AST 46 (H) 15 - 41 U/L  ALT 27 0 - 44 U/L   Alkaline Phosphatase 93 38 - 126 U/L   Total Bilirubin 2.9 (H) 0.3 - 1.2 mg/dL   GFR calc non Af Amer >60 >60 mL/min   GFR calc Af Amer >60 >60 mL/min   Anion gap 11 5 - 15  CBC with Differential  Result Value Ref Range   WBC 20.6 (H) 4.0 - 10.5 K/uL   RBC 2.75 (L) 3.87 - 5.11 MIL/uL   Hemoglobin 8.0 (L) 12.0 - 15.0 g/dL   HCT 23.9 (L) 36.0 - 46.0 %   MCV 86.9 80.0 - 100.0 fL   MCH 29.1 26.0 - 34.0 pg   MCHC 33.5 30.0 - 36.0 g/dL   RDW 20.5 (H) 11.5 - 15.5 %   Platelets 587 (H) 150 - 400 K/uL   nRBC 0.3 (H) 0.0 - 0.2 %   Neutrophils Relative % 66 %   Neutro Abs 13.6 (H) 1.7 - 7.7 K/uL   Lymphocytes Relative 17 %   Lymphs Abs 3.6 0.7 - 4.0 K/uL   Monocytes Relative 12 %   Monocytes Absolute 2.6 (H) 0.1 - 1.0 K/uL   Eosinophils Relative 3 %   Eosinophils Absolute 0.6 (H) 0.0 - 0.5 K/uL   Basophils Relative 1 %   Basophils Absolute 0.1 0.0 - 0.1 K/uL   Immature Granulocytes 1 %   Abs Immature Granulocytes 0.17 (H) 0.00 - 0.07 K/uL   Tammy Sours Bodies PRESENT     Polychromasia MARKED    Sickle Cells MARKED    Target Cells PRESENT   Reticulocytes  Result Value Ref Range   Retic Ct Pct 10.2 (H) 0.4 - 3.1 %   RBC. 2.76 (L) 3.87 - 5.11 MIL/uL   Retic Count, Absolute 281.2 (H) 19.0 - 186.0 K/uL   Immature Retic Fract 29.9 (H) 2.3 - 15.9 %  I-Stat beta hCG blood, ED  Result Value Ref Range   I-stat hCG, quantitative <5.0 <5 mIU/mL   Comment 3           DG Chest 2 View  Result Date: 05/26/2019 CLINICAL DATA:  Sickle cell pain crisis EXAM: CHEST - 2 VIEW COMPARISON:  Three days ago FINDINGS: Bilateral porta catheter with tips in good position in the superior cavoatrial junction. Chronic mild interstitial coarsening. There is no edema, consolidation, effusion, or pneumothorax. Normal heart size and mediastinal contours. Cholecystectomy. IMPRESSION: Stable chest.  No evidence of active disease. Electronically Signed   By: Monte Fantasia M.D.   On: 05/26/2019 09:06   DG Chest Port 1 View  Result Date: 06/23/2019 CLINICAL DATA:  Sickle cell pain crisis EXAM: PORTABLE CHEST 1 VIEW COMPARISON:  06/08/2019 FINDINGS: Chronic interstitial coarsening. There is no edema, consolidation, effusion, or pneumothorax. Port from bilateral approach with tips in good position about the upper cavoatrial junction. Normal heart size and mediastinal contours. IMPRESSION: No evidence of active disease. Electronically Signed   By: Monte Fantasia M.D.   On: 06/23/2019 05:37   DG Chest Portable 1 View  Result Date: 06/08/2019 CLINICAL DATA:  Upper back pain and cough, history of sickle cell in the EXAM: PORTABLE CHEST 1 VIEW COMPARISON:  Radiograph 05/04/2019, CT 04/19/2019 FINDINGS: Bilateral left and right IJ approach Port-A-Cath remain in stable position comparison exam. No consolidation, features of edema, pneumothorax, or effusion. The cardiomediastinal contours are unremarkable. No acute osseous or soft tissue abnormality. Cholecystectomy clips in the right upper quadrant.  IMPRESSION: No acute cardiopulmonary abnormality. Electronically Signed   By: March Rummage  [facility] M.D.   On: 06/08/2019 22:12     MDM   Patient is presenting to the ED for chest pain without shortness of breath, consistent with her prior episodes of sickle cell crises.  DG chest obtained today demonstrates no evidence of active disease or concern for acute chest syndrome.  Patient is hemodynamically stable.  Hemoglobin of 8.0 is consistent with her baseline, as is her leukocytosis to 20.6, mildly increased from 19.1 from labs obtained 2 weeks ago.  CMP also reassuring and demonstrates no electrolyte abnormalities.  Medications already ordered by handoff provider and current plan is to reassess patient after she receives her third dose of IV Dilaudid to then determine whether or not she is improved and safe for discharge or if she will need to be admitted to sickle cell team for pain control.  After my initial physical exam and evaluation of patient, I agree with current assessment and plan.  Patient is currently still endorsing pain symptoms, although improved.  Will reevaluate after final round of Dilaudid.  After final round of Dilaudid, patient is feeling improved and is reassured by her laboratory work-up and imaging. Safe for discharge at this time. Recommend that she continue her outpatient pain medication. Strict return precautions discussed. All of the evaluation and work-up results were discussed with the patient and any family at bedside. They were provided opportunity to ask any additional questions and have none at this time. They have expressed understanding of verbal discharge instructions as well as return precautions and are agreeable to the plan.       Corena Herter, PA-C 06/23/19 [id number]    Virgel Manifold, MD 06/23/19 [phone]

## 2019-06-26 ENCOUNTER — Other Ambulatory Visit: Payer: Self-pay

## 2019-06-26 ENCOUNTER — Inpatient Hospital Stay (HOSPITAL_COMMUNITY)
Admission: EM | Admit: 2019-06-26 | Discharge: 2019-07-02 | DRG: 812 | Disposition: A | Discharge status: Home / Self Care | Payer: Medicare Other | Attending: Internal Medicine | Admitting: Internal Medicine

## 2019-06-26 ENCOUNTER — Emergency Department (HOSPITAL_COMMUNITY): Payer: Medicare Other

## 2019-06-26 ENCOUNTER — Encounter (HOSPITAL_COMMUNITY): Payer: Self-pay | Admitting: Emergency Medicine

## 2019-06-26 DIAGNOSIS — R079 Chest pain, unspecified: Secondary | ICD-10-CM

## 2019-06-26 DIAGNOSIS — L309 Dermatitis, unspecified: Secondary | ICD-10-CM | POA: Diagnosis not present

## 2019-06-26 DIAGNOSIS — R52 Pain, unspecified: Secondary | ICD-10-CM | POA: Diagnosis present

## 2019-06-26 DIAGNOSIS — Z841 Family history of disorders of kidney and ureter: Secondary | ICD-10-CM | POA: Diagnosis not present

## 2019-06-26 DIAGNOSIS — Z881 Allergy status to other antibiotic agents status: Secondary | ICD-10-CM

## 2019-06-26 DIAGNOSIS — Z79899 Other long term (current) drug therapy: Secondary | ICD-10-CM | POA: Diagnosis not present

## 2019-06-26 DIAGNOSIS — Z8249 Family history of ischemic heart disease and other diseases of the circulatory system: Secondary | ICD-10-CM

## 2019-06-26 DIAGNOSIS — Z20822 Contact with and (suspected) exposure to covid-19: Secondary | ICD-10-CM | POA: Diagnosis present

## 2019-06-26 DIAGNOSIS — Z86711 Personal history of pulmonary embolism: Secondary | ICD-10-CM

## 2019-06-26 DIAGNOSIS — D72829 Elevated white blood cell count, unspecified: Secondary | ICD-10-CM | POA: Diagnosis not present

## 2019-06-26 DIAGNOSIS — J4520 Mild intermittent asthma, uncomplicated: Secondary | ICD-10-CM | POA: Diagnosis present

## 2019-06-26 DIAGNOSIS — Z8349 Family history of other endocrine, nutritional and metabolic diseases: Secondary | ICD-10-CM

## 2019-06-26 DIAGNOSIS — G894 Chronic pain syndrome: Secondary | ICD-10-CM

## 2019-06-26 DIAGNOSIS — D5700 Hb-SS disease with crisis, unspecified: Secondary | ICD-10-CM | POA: Diagnosis not present

## 2019-06-26 DIAGNOSIS — Z7901 Long term (current) use of anticoagulants: Secondary | ICD-10-CM | POA: Diagnosis not present

## 2019-06-26 DIAGNOSIS — Z888 Allergy status to other drugs, medicaments and biological substances status: Secondary | ICD-10-CM | POA: Diagnosis not present

## 2019-06-26 DIAGNOSIS — F1120 Opioid dependence, uncomplicated: Secondary | ICD-10-CM | POA: Diagnosis present

## 2019-06-26 DIAGNOSIS — J45909 Unspecified asthma, uncomplicated: Secondary | ICD-10-CM

## 2019-06-26 HISTORY — DX: Personal history of pulmonary embolism: Z86.711

## 2019-06-26 LAB — COMPREHENSIVE METABOLIC PANEL
ALT: 20 U/L (ref 0–44)
AST: 36 U/L (ref 15–41)
Albumin: 4.6 g/dL (ref 3.5–5.0)
Alkaline Phosphatase: 91 U/L (ref 38–126)
Anion gap: 6 (ref 5–15)
BUN: 7 mg/dL (ref 6–20)
CO2: 23 mmol/L (ref 22–32)
Calcium: 9 mg/dL (ref 8.9–10.3)
Chloride: 107 mmol/L (ref 98–111)
Creatinine, Ser: 0.44 mg/dL (ref 0.44–1.00)
GFR calc Af Amer: >60 mL/min (ref >60)
GFR calc non Af Amer: >60 mL/min (ref >60)
Glucose, Bld: 98 mg/dL (ref 70–99)
Potassium: 3.8 mmol/L (ref 3.5–5.1)
Sodium: 136 mmol/L (ref 135–145)
Total Bilirubin: 3.4 mg/dL — ABNORMAL HIGH (ref 0.3–1.2)
Total Protein: 8.6 g/dL — ABNORMAL HIGH (ref 6.5–8.1)

## 2019-06-26 MED ORDER — HYDROMORPHONE HCL 2 MG/ML IJ SOLN
2.0000 mg | INTRAMUSCULAR | Status: AC
  Filled 2019-06-26: qty 1

## 2019-06-26 MED ORDER — HYDROMORPHONE HCL 2 MG/ML IJ SOLN: 2.0000 mg | INTRAMUSCULAR | Status: AC

## 2019-06-26 MED ORDER — SODIUM CHLORIDE 0.9% FLUSH
3.0000 mL | Freq: Once | INTRAVENOUS | Status: AC
  Administered 2019-06-26: 3 mL via INTRAVENOUS

## 2019-06-26 MED ORDER — HYDROMORPHONE HCL 2 MG/ML IJ SOLN
2.0000 mg | INTRAMUSCULAR | Status: AC
  Administered 2019-06-27: 2 mg via INTRAVENOUS

## 2019-06-26 MED ORDER — HYDROMORPHONE HCL 2 MG/ML IJ SOLN
2.0000 mg | INTRAMUSCULAR | Status: AC
  Administered 2019-06-26: 2 mg via INTRAVENOUS

## 2019-06-26 MED ORDER — SODIUM CHLORIDE 0.45 % IV SOLN: INTRAVENOUS | Status: DC

## 2019-06-26 MED ORDER — ONDANSETRON HCL 4 MG/2ML IJ SOLN
4.0000 mg | INTRAMUSCULAR | Status: DC | PRN
  Administered 2019-06-26: 4 mg via INTRAVENOUS
  Filled 2019-06-26: qty 2

## 2019-06-26 MED ORDER — HYDROMORPHONE HCL 2 MG/ML IJ SOLN
2.0000 mg | INTRAMUSCULAR | Status: AC
  Administered 2019-06-27: 2 mg via INTRAVENOUS
  Filled 2019-06-26 (×2): qty 1

## 2019-06-26 MED ORDER — DIPHENHYDRAMINE HCL 25 MG PO CAPS
25.0000 mg | ORAL_CAPSULE | ORAL | Status: DC | PRN
  Administered 2019-06-27: 25 mg via ORAL
  Filled 2019-06-26: qty 1

## 2019-06-26 MED ORDER — KETOROLAC TROMETHAMINE 30 MG/ML IJ SOLN
30.0000 mg | INTRAMUSCULAR | Status: AC
  Administered 2019-06-26: 30 mg via INTRAVENOUS
  Filled 2019-06-26: qty 1

## 2019-06-26 NOTE — ED Notes (Signed)
EKG completed MD aware

## 2019-06-26 NOTE — ED Provider Notes (Signed)
[place] DEPT Provider Note   CSN: [id number] Arrival date & time: 06/26/19  1648     History Chief Complaint  Patient presents with  . Sickle Cell Pain Crisis  . Chest Pain  . Leg Pain    Molly Gutierrez is a 39 y.o. female.  The history is provided by the patient and medical records. No language interpreter was used.     39 year old female with history of sickle cell anemia, chronic pain syndrome, presenting with sickle cell related pain.  Patient report for the past 3 days she has had persistent pain to her chest, bilateral thighs and legs that felt similar to prior sickle cell crisis.  Pain sharp throbbing persistent not adequately controlled with her home medication.  Furthermore, she now endorsed nausea, vomiting not keeping her medication down.  She believes temperature changes have contributed to her symptoms.  She denies having fever, chills, runny nose sneezing coughing abdominal pain dysuria or any cold symptoms.  No recent sick contact with anyone with COVID-19.  Denies shortness of breath.  Past Medical History:  Diagnosis Date  . Asthma   . Eczema   . Sickle cell anemia [facility])     Patient Active Problem List   Diagnosis Date Noted  . Hypokalemia 05/05/2019  . Thrombocytosis ([place]) 05/05/2019  . Breast mass in female   . Sickle cell pain crisis ([place]) 04/19/2019  . Leukocytosis 04/19/2019  . Chronic pain syndrome 04/19/2019  . History of pulmonary embolism 04/19/2019    Past Surgical History:  Procedure Laterality Date  . CHOLECYSTECTOMY    . ERCP    . JOINT REPLACEMENT    . PORTA CATH INSERTION    . TUBAL LIGATION    . WISDOM TOOTH EXTRACTION       OB History   No obstetric history on file.     Family History  Problem Relation Age of Onset  . Renal Disease Mother   . Hypertension Mother   . High Cholesterol Mother     Social History   Tobacco Use  . Smoking status: Never Smoker  . Smokeless tobacco:  Never Used  Substance Use Topics  . Alcohol use: Never  . Drug use: Never    Home Medications Prior to Admission medications   Medication Sig Start Date End Date Taking? Authorizing Provider  albuterol (VENTOLIN HFA) 108 (90 Base) MCG/ACT inhaler Inhale 2 puffs into the lungs 2 (two) times daily as needed for wheezing or shortness of breath. 12/31/16   [provider]  Cholecalciferol (VITAMIN D3) 25 MCG (1000 UT) CAPS Take 1,000 Units by mouth daily.    [provider]  Deferasirox 360 MG TABS Take 1,080 mg by mouth daily. 09/27/16   [provider]  diphenhydrAMINE (BENADRYL) 25 mg capsule Take 25 mg by mouth 3 (three) times daily as needed for itching.    [provider]  folic acid (FOLVITE) 1 MG tablet Take 1 mg by mouth daily. 12/31/16   [provider]  HYDROmorphone (DILAUDID) 4 MG tablet Take 1 tablet (4 mg total) by mouth every 6 (six) hours as needed for severe pain. 06/01/19   [name], FNP  mirtazapine (REMERON) 45 MG tablet Take 45 mg by mouth at bedtime. 09/30/18   [provider]  mometasone-formoterol (DULERA) 100-5 MCG/ACT AERO Inhale 2 puffs into the lungs daily as needed for wheezing or shortness of breath.    [provider]  omeprazole (PRILOSEC) 20 MG capsule Take  20 mg by mouth 2 (two) times a day. 09/08/18   [provider]  promethazine (PHENERGAN) 25 MG tablet Take 25 mg by mouth every 8 (eight) hours as needed for nausea/vomiting. 06/22/19   [provider]  vitamin B-12 (CYANOCOBALAMIN) 1000 MCG tablet Take 1,000 mcg by mouth daily.    [provider]  voxelotor (OXBRYTA) 500 MG TABS tablet Take 1,500 mg by mouth daily.    [provider]  XARELTO 20 MG TABS tablet Take 1 tablet (20 mg total) by mouth daily. 05/14/19   [name], MD    Allergies    Cefaclor and Hydroxyurea  Review of Systems   Review of Systems  All other systems reviewed and are  negative.   Physical Exam Updated Vital Signs BP 108/75 (BP Location: Left Arm)   Pulse (!) 109   Temp 97.9 F (36.6 C) (Oral)   Resp 16   LMP 06/19/2019   SpO2 98%   Physical Exam Vitals and nursing note reviewed.  Constitutional:      General: She is not in acute distress.    Appearance: She is well-developed.  HENT:     Head: Atraumatic.  Eyes:     Conjunctiva/sclera: Conjunctivae normal.  Cardiovascular:     Rate and Rhythm: Normal rate and regular rhythm.     Pulses: Normal pulses.     Heart sounds: Normal heart sounds.  Pulmonary:     Effort: Pulmonary effort is normal.     Breath sounds: Normal breath sounds. No wheezing, rhonchi or rales.  Abdominal:     Palpations: Abdomen is soft.     Tenderness: There is no abdominal tenderness.  Musculoskeletal:     Cervical back: Neck supple.  Skin:    Findings: No rash.  Neurological:     Mental Status: She is alert and oriented to person, place, and time.  Psychiatric:        Mood and Affect: Mood normal.     ED Results / Procedures / Treatments   Labs (all labs ordered are listed, but only abnormal results are displayed) Labs Reviewed  COMPREHENSIVE METABOLIC PANEL - Abnormal; Notable for the following components:      Result Value   Total Protein 8.6 (*)    Total Bilirubin 3.4 (*)    All other components within normal limits  CBC WITH DIFFERENTIAL/PLATELET - Abnormal; Notable for the following components:   WBC 17.1 (*)    RBC 2.61 (*)    Hemoglobin 7.5 (*)    HCT 22.2 (*)    RDW 19.3 (*)    Platelets 655 (*)    nRBC 0.4 (*)    Neutro Abs 10.9 (*)    Monocytes Absolute 2.2 (*)    Abs Immature Granulocytes 0.14 (*)    All other components within normal limits  RETICULOCYTES - Abnormal; Notable for the following components:   Retic Ct Pct 14.4 (*)    RBC. 2.61 (*)    Retic Count, Absolute 367.5 (*)    Immature Retic Fract 21.2 (*)    All other components within normal limits  I-STAT BETA HCG BLOOD,  ED ([facility], [facility], [facility] ONLY)    EKG None  Radiology DG Chest Port 1 View  Result Date: 06/26/2019 CLINICAL DATA:  39 year old female with sickle cell anemia and chest pain. EXAM: PORTABLE CHEST 1 VIEW COMPARISON:  Chest radiograph dated 06/23/2019. FINDINGS: Similar positioning of the bilateral port. The lungs are clear. There is no pleural effusion  or pneumothorax. Mild cardiomegaly. No acute osseous pathology. IMPRESSION: 1. No acute cardiopulmonary process. 2. Mild cardiomegaly. Electronically Signed   By: Anner Crete M.D.   On: 06/26/2019 22:56    Procedures Procedures (including critical care time)  Medications Ordered in ED Medications  0.45 % sodium chloride infusion (has no administration in time range)  ketorolac (TORADOL) 30 MG/ML injection 30 mg (has no administration in time range)  HYDROmorphone (DILAUDID) injection 2 mg (has no administration in time range)    Or  HYDROmorphone (DILAUDID) injection 2 mg (has no administration in time range)  HYDROmorphone (DILAUDID) injection 2 mg (has no administration in time range)    Or  HYDROmorphone (DILAUDID) injection 2 mg (has no administration in time range)  HYDROmorphone (DILAUDID) injection 2 mg (has no administration in time range)    Or  HYDROmorphone (DILAUDID) injection 2 mg (has no administration in time range)  HYDROmorphone (DILAUDID) injection 2 mg (has no administration in time range)    Or  HYDROmorphone (DILAUDID) injection 2 mg (has no administration in time range)  diphenhydrAMINE (BENADRYL) capsule 25-50 mg (has no administration in time range)  ondansetron (ZOFRAN) injection 4 mg (has no administration in time range)  sodium chloride flush (NS) 0.9 % injection 3 mL (3 mLs Intravenous Given 06/26/19 2230)    ED Course  I have reviewed the triage vital signs and the nursing notes.  Pertinent labs & imaging results that were available during my care of the patient were reviewed by me and considered in my medical  decision making (see chart for details).  Clinical Course as of Jun 26 1605  Nancy Fetter Jun 27, 2019  0111 Plan: Patient pending additional blood work, EKG.  She has only received 1 dose of medication.  Will recheck after additional pain control.   [HM]  0203 Pt itching.  Requesting more Benadryl.  She received 25 mg IV.  Will give 25 mg p.o.   [HM]  [id number] Patient reports some improvement in pain however she was just given her third dose and would like to wait until it begins to work before making a decision about home versus admission.   [HM]  [phone] Discussed with Dr. Posey Pronto who will admit   [HM]    Clinical Course User Index [HM] Muthersbaugh, Gwenlyn Perking   MDM Rules/Calculators/A&P                      BP 110/73   Pulse (!) 104   Temp 97.9 F (36.6 C) (Oral)   Resp 13   Ht 5\' 3"  (1.6 m)   Wt 56.2 kg   LMP 06/19/2019   SpO2 97%   BMI 21.97 kg/m   Final Clinical Impression(s) / ED Diagnoses Final diagnoses:  None    Rx / DC Orders ED Discharge Orders    None     10:40 PM Patient complained of chest and back pain similar to prior sickle cell pain crisis.  She is otherwise well-appearing, lungs clear on auscultation, no hypoxia, no fever.  Low suspicion for acute chest syndrome.  Work-up initiated, will provide pain management.  12:39 AM Pt sign out to [employer], PA-C who will monitor pt's pain and will dispo as appropriate.    Domenic Moras, PA-C 06/27/19 1608    Maudie Flakes, MD 07/01/19 [phone]

## 2019-06-27 ENCOUNTER — Encounter (HOSPITAL_COMMUNITY): Payer: Self-pay | Admitting: Internal Medicine

## 2019-06-27 DIAGNOSIS — Z881 Allergy status to other antibiotic agents status: Secondary | ICD-10-CM | POA: Diagnosis not present

## 2019-06-27 DIAGNOSIS — D5700 Hb-SS disease with crisis, unspecified: Secondary | ICD-10-CM | POA: Diagnosis present

## 2019-06-27 DIAGNOSIS — Z841 Family history of disorders of kidney and ureter: Secondary | ICD-10-CM | POA: Diagnosis not present

## 2019-06-27 DIAGNOSIS — R079 Chest pain, unspecified: Secondary | ICD-10-CM | POA: Diagnosis not present

## 2019-06-27 DIAGNOSIS — Z8249 Family history of ischemic heart disease and other diseases of the circulatory system: Secondary | ICD-10-CM | POA: Diagnosis not present

## 2019-06-27 DIAGNOSIS — D72829 Elevated white blood cell count, unspecified: Secondary | ICD-10-CM | POA: Diagnosis present

## 2019-06-27 DIAGNOSIS — Z20822 Contact with and (suspected) exposure to covid-19: Secondary | ICD-10-CM | POA: Diagnosis present

## 2019-06-27 DIAGNOSIS — Z86711 Personal history of pulmonary embolism: Secondary | ICD-10-CM | POA: Diagnosis not present

## 2019-06-27 DIAGNOSIS — J4520 Mild intermittent asthma, uncomplicated: Secondary | ICD-10-CM | POA: Diagnosis present

## 2019-06-27 DIAGNOSIS — G894 Chronic pain syndrome: Secondary | ICD-10-CM | POA: Diagnosis present

## 2019-06-27 DIAGNOSIS — Z8349 Family history of other endocrine, nutritional and metabolic diseases: Secondary | ICD-10-CM | POA: Diagnosis not present

## 2019-06-27 DIAGNOSIS — F1120 Opioid dependence, uncomplicated: Secondary | ICD-10-CM | POA: Diagnosis present

## 2019-06-27 DIAGNOSIS — Z79899 Other long term (current) drug therapy: Secondary | ICD-10-CM | POA: Diagnosis not present

## 2019-06-27 DIAGNOSIS — Z888 Allergy status to other drugs, medicaments and biological substances status: Secondary | ICD-10-CM | POA: Diagnosis not present

## 2019-06-27 DIAGNOSIS — L309 Dermatitis, unspecified: Secondary | ICD-10-CM | POA: Diagnosis present

## 2019-06-27 DIAGNOSIS — Z7901 Long term (current) use of anticoagulants: Secondary | ICD-10-CM | POA: Diagnosis not present

## 2019-06-27 LAB — CBC WITH DIFFERENTIAL/PLATELET
Abs Immature Granulocytes: 0.14 10*3/uL — ABNORMAL HIGH (ref 0.00–0.07)
Basophils Absolute: 0.1 10*3/uL (ref 0.0–0.1)
Basophils Relative: 1 %
Eosinophils Absolute: 0.1 10*3/uL (ref 0.0–0.5)
Eosinophils Relative: 1 %
HCT: 22.2 % — ABNORMAL LOW (ref 36.0–46.0)
Hemoglobin: 7.5 g/dL — ABNORMAL LOW (ref 12.0–15.0)
Immature Granulocytes: 1 %
Lymphocytes Relative: 21 %
Lymphs Abs: 3.6 10*3/uL (ref 0.7–4.0)
MCH: 28.7 pg (ref 26.0–34.0)
MCHC: 33.8 g/dL (ref 30.0–36.0)
MCV: 85.1 fL (ref 80.0–100.0)
Monocytes Absolute: 2.2 10*3/uL — ABNORMAL HIGH (ref 0.1–1.0)
Monocytes Relative: 13 %
Neutro Abs: 10.9 10*3/uL — ABNORMAL HIGH (ref 1.7–7.7)
Neutrophils Relative %: 63 %
Platelets: 655 10*3/uL — ABNORMAL HIGH (ref 150–400)
RBC: 2.61 MIL/uL — ABNORMAL LOW (ref 3.87–5.11)
RDW: 19.3 % — ABNORMAL HIGH (ref 11.5–15.5)
WBC: 17.1 10*3/uL — ABNORMAL HIGH (ref 4.0–10.5)
nRBC: 0.4 % — ABNORMAL HIGH (ref 0.0–0.2)

## 2019-06-27 LAB — RETICULOCYTES
Immature Retic Fract: 21.2 % — ABNORMAL HIGH (ref 2.3–15.9)
RBC.: 2.61 MIL/uL — ABNORMAL LOW (ref 3.87–5.11)
Retic Count, Absolute: 367.5 10*3/uL — ABNORMAL HIGH (ref 19.0–186.0)
Retic Ct Pct: 14.4 % — ABNORMAL HIGH (ref 0.4–3.1)

## 2019-06-27 LAB — LIPASE, BLOOD: Lipase: 31 U/L (ref 11–51)

## 2019-06-27 LAB — SARS CORONAVIRUS 2 (TAT 6-24 HRS): SARS Coronavirus 2: NEGATIVE

## 2019-06-27 LAB — TROPONIN I (HIGH SENSITIVITY): Troponin I (High Sensitivity): <2 ng/L (ref <18)

## 2019-06-27 MED ORDER — PROMETHAZINE HCL 25 MG/ML IJ SOLN
12.5000 mg | Freq: Three times a day (TID) | INTRAMUSCULAR | Status: DC | PRN
  Administered 2019-06-27 – 2019-06-30 (×8): 12.5 mg via INTRAVENOUS
  Filled 2019-06-27 (×8): qty 1

## 2019-06-27 MED ORDER — NALOXONE HCL 0.4 MG/ML IJ SOLN: 0.4000 mg | INTRAMUSCULAR | Status: DC | PRN

## 2019-06-27 MED ORDER — HYDROMORPHONE 1 MG/ML IV SOLN
INTRAVENOUS | Status: DC
  Administered 2019-06-27: 3.5 mg via INTRAVENOUS
  Administered 2019-06-27: 30 mg via INTRAVENOUS
  Administered 2019-06-27: 3.5 mg via INTRAVENOUS
  Administered 2019-06-28: 30 mg via INTRAVENOUS
  Administered 2019-06-28: 3.5 mg via INTRAVENOUS
  Administered 2019-06-28 (×3): 2 mg via INTRAVENOUS
  Administered 2019-06-28: 4 mg via INTRAVENOUS
  Administered 2019-06-28: 3.5 mg via INTRAVENOUS
  Administered 2019-06-28: 3 mg via INTRAVENOUS
  Administered 2019-06-28: 5.5 mg via INTRAVENOUS
  Administered 2019-06-29: 2 mg via INTRAVENOUS
  Administered 2019-06-29: 11 mg via INTRAVENOUS
  Filled 2019-06-27 (×2): qty 30

## 2019-06-27 MED ORDER — FOLIC ACID 1 MG PO TABS
1.0000 mg | ORAL_TABLET | Freq: Every day | ORAL | Status: DC
  Administered 2019-06-27 – 2019-07-02 (×6): 1 mg via ORAL
  Filled 2019-06-27 (×6): qty 1

## 2019-06-27 MED ORDER — MOMETASONE FURO-FORMOTEROL FUM 100-5 MCG/ACT IN AERO
2.0000 | INHALATION_SPRAY | Freq: Two times a day (BID) | RESPIRATORY_TRACT | Status: DC
  Administered 2019-06-27 – 2019-07-02 (×7): 2 via RESPIRATORY_TRACT
  Filled 2019-06-27: qty 8.8

## 2019-06-27 MED ORDER — DEFERASIROX 360 MG PO TABS: 1080.0000 mg | ORAL_TABLET | Freq: Every day | ORAL | Status: DC

## 2019-06-27 MED ORDER — KETOROLAC TROMETHAMINE 15 MG/ML IJ SOLN
15.0000 mg | Freq: Four times a day (QID) | INTRAMUSCULAR | Status: AC
  Administered 2019-06-27 – 2019-07-02 (×20): 15 mg via INTRAVENOUS
  Filled 2019-06-27 (×20): qty 1

## 2019-06-27 MED ORDER — PANTOPRAZOLE SODIUM 40 MG PO TBEC
40.0000 mg | DELAYED_RELEASE_TABLET | Freq: Every day | ORAL | Status: DC
  Administered 2019-06-27 – 2019-07-02 (×6): 40 mg via ORAL
  Filled 2019-06-27 (×6): qty 1

## 2019-06-27 MED ORDER — CHLORHEXIDINE GLUCONATE CLOTH 2 % EX PADS
6.0000 | MEDICATED_PAD | Freq: Every day | CUTANEOUS | Status: DC
  Administered 2019-06-27 – 2019-07-02 (×3): 6 via TOPICAL

## 2019-06-27 MED ORDER — HYDROMORPHONE HCL 1 MG/ML IJ SOLN
1.0000 mg | Freq: Once | INTRAMUSCULAR | Status: AC
  Administered 2019-06-27: 1 mg via INTRAVENOUS
  Filled 2019-06-27: qty 1

## 2019-06-27 MED ORDER — VITAMIN B-12 1000 MCG PO TABS
1000.0000 ug | ORAL_TABLET | Freq: Every day | ORAL | Status: DC
  Administered 2019-06-27 – 2019-07-02 (×6): 1000 ug via ORAL
  Filled 2019-06-27 (×6): qty 1

## 2019-06-27 MED ORDER — ONDANSETRON HCL 4 MG/2ML IJ SOLN
4.0000 mg | Freq: Four times a day (QID) | INTRAMUSCULAR | Status: DC | PRN
  Administered 2019-06-30: 4 mg via INTRAVENOUS
  Filled 2019-06-27: qty 2

## 2019-06-27 MED ORDER — SODIUM CHLORIDE 0.9% FLUSH: 9.0000 mL | INTRAVENOUS | Status: DC | PRN

## 2019-06-27 MED ORDER — RIVAROXABAN 20 MG PO TABS
20.0000 mg | ORAL_TABLET | Freq: Every day | ORAL | Status: DC
  Administered 2019-06-27 – 2019-07-02 (×6): 20 mg via ORAL
  Filled 2019-06-27 (×6): qty 1

## 2019-06-27 MED ORDER — POLYETHYLENE GLYCOL 3350 17 G PO PACK: 17.0000 g | PACK | Freq: Every day | ORAL | Status: DC | PRN

## 2019-06-27 MED ORDER — VITAMIN D3 25 MCG (1000 UNIT) PO TABS
1000.0000 [IU] | ORAL_TABLET | Freq: Every day | ORAL | Status: DC
  Administered 2019-06-27 – 2019-07-02 (×6): 1000 [IU] via ORAL
  Filled 2019-06-27 (×6): qty 1

## 2019-06-27 MED ORDER — VOXELOTOR 500 MG PO TABS: 1500.0000 mg | ORAL_TABLET | Freq: Every day | ORAL | Status: DC

## 2019-06-27 MED ORDER — ALBUTEROL SULFATE (2.5 MG/3ML) 0.083% IN NEBU: 2.5000 mg | INHALATION_SOLUTION | Freq: Two times a day (BID) | RESPIRATORY_TRACT | Status: DC | PRN

## 2019-06-27 MED ORDER — LACTATED RINGERS IV SOLN: INTRAVENOUS | Status: DC

## 2019-06-27 MED ORDER — HYDROMORPHONE HCL 1 MG/ML IJ SOLN
1.0000 mg | INTRAMUSCULAR | Status: DC | PRN
  Administered 2019-06-27 (×2): 1 mg via INTRAVENOUS
  Filled 2019-06-27 (×2): qty 1

## 2019-06-27 MED ORDER — DIPHENHYDRAMINE HCL 50 MG/ML IJ SOLN
25.0000 mg | Freq: Once | INTRAMUSCULAR | Status: AC
  Administered 2019-06-27: 25 mg via INTRAVENOUS
  Filled 2019-06-27: qty 1

## 2019-06-27 MED ORDER — MIRTAZAPINE 7.5 MG PO TABS
45.0000 mg | ORAL_TABLET | Freq: Every day | ORAL | Status: DC
  Administered 2019-06-27 – 2019-07-01 (×5): 45 mg via ORAL
  Filled 2019-06-27 (×6): qty 2

## 2019-06-27 MED ORDER — SENNOSIDES-DOCUSATE SODIUM 8.6-50 MG PO TABS
1.0000 | ORAL_TABLET | Freq: Two times a day (BID) | ORAL | Status: DC
  Administered 2019-06-27 – 2019-07-02 (×11): 1 via ORAL
  Filled 2019-06-27 (×11): qty 1

## 2019-06-27 MED ORDER — DIPHENHYDRAMINE HCL 25 MG PO CAPS
25.0000 mg | ORAL_CAPSULE | Freq: Four times a day (QID) | ORAL | Status: DC | PRN
  Administered 2019-06-27 – 2019-06-29 (×2): 25 mg via ORAL
  Filled 2019-06-27 (×3): qty 1

## 2019-06-27 NOTE — H&P (Signed)
History and Physical    [facility] [id number] DOB: 09-Sep-1980 DOA: 06/26/2019  PCP: Rosana Berger, FNP  Patient coming from: Home  I have personally briefly reviewed patient's old medical records in [place]  Chief Complaint: Chest pain, leg pain  HPI: Molly Gutierrez is a 39 y.o. female with medical history significant of sickle cell disease, asthma, eczema, PE on Xarelto presented to the ED with chest pain and leg pain ongoing for 2 days.  Pain is similar to her previous acute crisis.  Thinks weather is the likely triggering factor.  Home p.o. narcotics have not been sufficient to control the pain.  Denies any dyspnea, palpitations, abdominal pain or diarrhea.  Complains of nausea/vomiting which is typical of her acute crisis.  She has a history of acute chest syndrome but this episode does not feel like it.  History of PE and is compliant with her Xarelto.  Usually has 1 crisis every 2 months, sometimes requiring admission.  She tells me she gets Dilaudid 1 to 2 mg, Benadryl 25 mg, Phenergan 12.5 mg IV during her crisis.  (ED Course: While in the ED she was found to have leukocytosis with WBC count 17,000, hemoglobin 7.5.  Hemodynamically stable.  Received 3 doses of 2 mg IV Dilaudid and 1 dose of 30 mg IV Toradol which improved her pain some but did not resolve it.  Hospitalist called for admission for acute sickle cell crisis pain management.  Review of Systems: As per HPI otherwise 10 point review of systems negative.   Past Medical History:  Diagnosis Date  . Asthma   . Eczema   . Sickle cell anemia (HCC)     Past Surgical History:  Procedure Laterality Date  . CHOLECYSTECTOMY    . ERCP    . JOINT REPLACEMENT    . PORTA CATH INSERTION    . TUBAL LIGATION    . WISDOM TOOTH EXTRACTION       reports that she has never smoked. She has never used smokeless tobacco. She reports that she does not drink alcohol or use drugs.  Allergies  Allergen  Reactions  . Cefaclor Hives and Swelling  . Hydroxyurea Other (See Comments) and Palpitations    Lower blood levels and HR Other reaction(s): Hypotension, Other (See Comments) "it messes me up, it drops my levels and stuff" Lower blood levels and HR "it messes me up, it drops my levels and stuff"     Family History  Problem Relation Age of Onset  . Renal Disease Mother   . Hypertension Mother   . High Cholesterol Mother      Prior to Admission medications   Medication Sig Start Date End Date Taking? Authorizing Provider  albuterol (VENTOLIN HFA) 108 (90 Base) MCG/ACT inhaler Inhale 2 puffs into the lungs 2 (two) times daily as needed for wheezing or shortness of breath. 12/31/16  Yes [provider]  Cholecalciferol (VITAMIN D3) 25 MCG (1000 UT) CAPS Take 1,000 Units by mouth daily.   Yes [provider]  Deferasirox 360 MG TABS Take 1,080 mg by mouth daily. 09/27/16  Yes [provider]  diphenhydrAMINE (BENADRYL) 25 mg capsule Take 25 mg by mouth 3 (three) times daily as needed for itching.   Yes [provider]  folic acid (FOLVITE) 1 MG tablet Take 1 mg by mouth daily. 12/31/16  Yes [provider]  HYDROmorphone (DILAUDID) 4 MG tablet Take 1 tablet (4 mg total) by mouth every 6 (six) hours  as needed for severe pain. 06/01/19  Yes [name], FNP  mirtazapine (REMERON) 45 MG tablet Take 45 mg by mouth at bedtime. 09/30/18  Yes [provider]  mometasone-formoterol (DULERA) 100-5 MCG/ACT AERO Inhale 2 puffs into the lungs daily as needed for wheezing or shortness of breath.   Yes [provider]  omeprazole (PRILOSEC) 20 MG capsule Take 20 mg by mouth 2 (two) times a day. 09/08/18  Yes [provider]  promethazine (PHENERGAN) 25 MG tablet Take 25 mg by mouth every 8 (eight) hours as needed for nausea/vomiting. 06/22/19  Yes [provider]  vitamin B-12 (CYANOCOBALAMIN) 1000 MCG tablet Take 1,000 mcg  by mouth daily.   Yes [provider]  voxelotor (OXBRYTA) 500 MG TABS tablet Take 1,500 mg by mouth daily.   Yes [provider]  XARELTO 20 MG TABS tablet Take 1 tablet (20 mg total) by mouth daily. 05/14/19  Yes [name], MD    Physical Exam: Vitals:   06/27/19 0210 06/27/19 0300 06/27/19 0430 06/27/19 0435  BP:  93/63 94/68 102/81  Pulse:  91 77 90  Resp: 14 16 11 16   Temp:      TempSrc:      SpO2:  94% 97% 99%  Weight:      Height:        Constitutional: NAD, calm, comfortable Vitals:   06/27/19 0210 06/27/19 0300 06/27/19 0430 06/27/19 0435  BP:  93/63 94/68 102/81  Pulse:  91 77 90  Resp: 14 16 11 16   Temp:      TempSrc:      SpO2:  94% 97% 99%  Weight:      Height:       Eyes: PERRL, mild eyelid swelling bilaterally ENMT: Mucous membranes are moist.   Neck: normal, supple Respiratory: clear to auscultation bilaterally, no wheezing, no crackles. Normal respiratory effort. No accessory muscle use.  Central chest tender to palpate.  Port present right upper chest. Cardiovascular: Regular rate and rhythm, no murmurs. No extremity edema. Abdomen: no tenderness, no masses palpated. No hepatosplenomegaly. Bowel sounds positive.  Musculoskeletal: no clubbing / cyanosis. No joint deformity upper and lower extremities. Good ROM, no contractures.  Tender left lower extremity. Skin: no rashes, lesions, ulcers.  Neurologic: CN 2-12 grossly intact. Sensation intact, DTR normal. Strength 5/5 in all 4.  Psychiatric: Normal judgment and insight. Alert and oriented x 3. Normal mood.   Labs on Admission: I have personally reviewed following labs and imaging studies  CBC: Recent Labs  Lab 06/23/19 0457 06/26/19 2229  WBC 20.6* 17.1*  NEUTROABS 13.6* 10.9*  HGB 8.0* 7.5*  HCT 23.9* 22.2*  MCV 86.9 85.1  PLT 587* [id number]*   Basic Metabolic Panel: Recent Labs  Lab 06/23/19 0457 06/26/19 2229  NA 138 136  K 3.7 3.8  CL 103 107  CO2 24 23    GLUCOSE 112* 98  BUN 9 7  CREATININE 0.44 0.44  CALCIUM 9.2 9.0   GFR: Estimated Creatinine Clearance: 78.9 mL/min (by C-G formula based on SCr of 0.44 mg/dL). Liver Function Tests: Recent Labs  Lab 06/23/19 0457 06/26/19 2229  AST 46* 36  ALT 27 20  ALKPHOS 93 91  BILITOT 2.9* 3.4*  PROT 8.2* 8.6*  ALBUMIN 4.4 4.6   Recent Labs  Lab 06/26/19 2304  LIPASE 31   No results for input(s): AMMONIA in the last 168 hours. Coagulation Profile: No results for input(s): INR, PROTIME in the last 168  hours. Cardiac Enzymes: No results for input(s): CKTOTAL, CKMB, CKMBINDEX, TROPONINI in the last 168 hours. BNP (last 3 results) No results for input(s): PROBNP in the last 8760 hours. HbA1C: No results for input(s): HGBA1C in the last 72 hours. CBG: No results for input(s): GLUCAP in the last 168 hours. Lipid Profile: No results for input(s): CHOL, HDL, LDLCALC, TRIG, CHOLHDL, LDLDIRECT in the last 72 hours. Thyroid Function Tests: No results for input(s): TSH, T4TOTAL, FREET4, T3FREE, THYROIDAB in the last 72 hours. Anemia Panel: Recent Labs    06/26/19 2229  RETICCTPCT 14.4*   Urine analysis:    Component Value Date/Time   COLORURINE YELLOW 06/09/2019 0200   APPEARANCEUR CLEAR 06/09/2019 0200   LABSPEC 1.012 06/09/2019 0200   PHURINE 6.0 06/09/2019 0200   GLUCOSEU NEGATIVE 06/09/2019 0200   HGBUR NEGATIVE 06/09/2019 0200   BILIRUBINUR NEGATIVE 06/09/2019 0200   KETONESUR NEGATIVE 06/09/2019 0200   PROTEINUR NEGATIVE 06/09/2019 0200   NITRITE NEGATIVE 06/09/2019 0200   LEUKOCYTESUR NEGATIVE 06/09/2019 0200    Radiological Exams on Admission: DG Chest Port 1 View  Result Date: 06/26/2019 CLINICAL DATA:  39 year old female with sickle cell anemia and chest pain. EXAM: PORTABLE CHEST 1 VIEW COMPARISON:  Chest radiograph dated 06/23/2019. FINDINGS: Similar positioning of the bilateral port. The lungs are clear. There is no pleural effusion or pneumothorax. Mild  cardiomegaly. No acute osseous pathology. IMPRESSION: 1. No acute cardiopulmonary process. 2. Mild cardiomegaly. Electronically Signed   By: Anner Crete M.D.   On: 06/26/2019 22:56    EKG: Independently reviewed.   Assessment/Plan Active Problems:   Sickle cell crisis (HCC)   Asthma  Acute sickle cell pain crisis History of sickle cell disease with frequent pain crisis.  Trigger likely weather change. Chest x-ray negative for infiltrates.  Not hypoxic.  Does not appear to have acute chest syndrome.  Chest pain is usually part of her acute pain crisis. Continue home medications for sickle cell disease Continue IV fluids. Pain control with Toradol scheduled.  IV Dilaudid as needed, Phenergan as needed, Benadryl as needed  History of PE Compliant on Xarelto Continue Xarelto  Asthma Stable Continue home medications  DVT prophylaxis: [id number]  Code Status: Full code  Family Communication: Discussed plan of care with patient Disposition Plan: Discharge home once pain crisis resolves Consults called: None Admission status: Inpatient    [name] MD [facility]  If 7PM-7AM, please contact night-coverage 06/27/2019, 5:02 AM

## 2019-06-27 NOTE — ED Provider Notes (Signed)
Care assumed from Domenic Moras, [place].  Please see his full H&P.  In short,  Molly Gutierrez is a 39 y.o. female presents for sickle cell pain crisis.  She is a history of sickle cell anemia, chronic pain syndrome.  She reports that she has had constant, worsening and persistent chest pain, back pain, bilateral thigh pain.  She reports the pain is throbbing.  She has been taking home medications without adequate relief.  She is also endorsing nausea and vomiting therefore she is unable to keep her medications down.  She denies fever, chills, nasal congestion, cough, abdominal pain, weakness, dizziness, syncope.  Records reviewed.  Patient was last evaluated on 06/23/2019 for similar symptoms.  She was not admitted at that time.  Physical Exam  BP 106/73   Pulse 98   Temp 97.9 F (36.6 C) (Oral)   Resp 17   Ht 5\' 3"  (1.6 m)   Wt 56.2 kg   LMP 06/19/2019   SpO2 95%   BMI 21.97 kg/m   Physical Exam Vitals and nursing note reviewed.  Constitutional:      General: She is not in acute distress.    Appearance: She is well-developed.  HENT:     Head: Normocephalic.  Eyes:     General: No scleral icterus.    Conjunctiva/sclera: Conjunctivae normal.  Cardiovascular:     Rate and Rhythm: Normal rate.  Pulmonary:     Effort: Pulmonary effort is normal.  Musculoskeletal:        General: Normal range of motion.     Cervical back: Normal range of motion.  Skin:    General: Skin is warm and dry.  Neurological:     Mental Status: She is alert.     ED Course/Procedures   Clinical Course as of Jun 26 417  Drexel Iha Plan: Patient pending additional blood work, EKG.  She has only received 1 dose of medication.  Will recheck after additional pain control.   [HM]  0203 Pt itching.  Requesting more Benadryl.  She received 25 mg IV.  Will give 25 mg p.o.   [HM]  [id number] Patient reports some improvement in pain however she was just given her third dose and would like to wait until  it begins to work before making a decision about home versus admission.   [HM]  [phone] Discussed with Dr. Posey Pronto who will admit   [HM]    Clinical Course User Index [HM] Molly Gutierrez, Molly Gutierrez    Procedures    EKG Interpretation  Date/Time:  Saturday June 26 2019 22:36:45 EST Ventricular Rate:  103 PR Interval:    QRS Duration: 84 QT Interval:  357 QTC Calculation: 468 R Axis:   46 Text Interpretation: Sinus tachycardia Abnormal R-wave progression, early transition Nonspecific T abnormalities, anterior leads Confirmed by Molly Gutierrez [phone]) on 06/27/2019 1:41:10 AM        MDM   Patient presents with sickle cell pain crisis.  She is noted to have a leukocytosis improved from last visit.  Hemoglobin is 7.5 today down from baseline of approximately 8 however approximately 1 month ago it was 7.4.  This does not appear to be significantly abnormal for her.  Patient does have an elevated reticulocyte count at 14.4 up from 10.24 days ago.  On initial arrival she was afebrile without hypoxia but did have a mild tachycardia.  She adamantly denies shortness of breath.  She has no history of DVT or PE.  It  is common for her to have chest pain with her sickle cell crisis.  EKG with nonspecific T wave abnormalities and sinus tachycardia but no overt ischemia.  Troponin negative.  Doubt acute coronary syndrome.  No Covid-like symptoms.  3:49 AM Patient continues to have severe pain.  She reports her pain remains uncontrolled and would like admission at this time.   Sickle cell crisis [facility])  Central chest pain      Molly Gutierrez, Molly Gutierrez 06/27/19 0420    Molly Fraise, MD 06/27/19 [phone]

## 2019-06-27 NOTE — ED Notes (Signed)
Gave pt a ginger ale with ice

## 2019-06-28 DIAGNOSIS — D72829 Elevated white blood cell count, unspecified: Secondary | ICD-10-CM | POA: Diagnosis not present

## 2019-06-28 DIAGNOSIS — D5700 Hb-SS disease with crisis, unspecified: Secondary | ICD-10-CM | POA: Diagnosis not present

## 2019-06-28 DIAGNOSIS — G894 Chronic pain syndrome: Secondary | ICD-10-CM

## 2019-06-28 LAB — PREPARE RBC (CROSSMATCH)

## 2019-06-28 LAB — CBC
HCT: 19 % — ABNORMAL LOW (ref 36.0–46.0)
Hemoglobin: 6.4 g/dL — CL (ref 12.0–15.0)
MCH: 29.4 pg (ref 26.0–34.0)
MCHC: 33.7 g/dL (ref 30.0–36.0)
MCV: 87.2 fL (ref 80.0–100.0)
Platelets: 596 10*3/uL — ABNORMAL HIGH (ref 150–400)
RBC: 2.18 MIL/uL — ABNORMAL LOW (ref 3.87–5.11)
RDW: 21.5 % — ABNORMAL HIGH (ref 11.5–15.5)
WBC: 18.1 10*3/uL — ABNORMAL HIGH (ref 4.0–10.5)
nRBC: 0.5 % — ABNORMAL HIGH (ref 0.0–0.2)

## 2019-06-28 MED ORDER — DIPHENHYDRAMINE HCL 50 MG/ML IJ SOLN: 25.0000 mg | Freq: Once | INTRAMUSCULAR | Status: DC | PRN

## 2019-06-28 MED ORDER — ACETAMINOPHEN 325 MG PO TABS
650.0000 mg | ORAL_TABLET | Freq: Once | ORAL | Status: AC
  Administered 2019-06-28: 650 mg via ORAL
  Filled 2019-06-28: qty 2

## 2019-06-28 MED ORDER — ACETAMINOPHEN 325 MG PO TABS: 650.0000 mg | ORAL_TABLET | Freq: Once | ORAL | Status: DC | PRN

## 2019-06-28 MED ORDER — DIPHENHYDRAMINE HCL 50 MG/ML IJ SOLN
25.0000 mg | Freq: Once | INTRAMUSCULAR | Status: AC
  Administered 2019-06-28: 25 mg via INTRAVENOUS
  Filled 2019-06-28: qty 1

## 2019-06-28 NOTE — Progress Notes (Signed)
Subjective: [facility], a 39 year old female with a medical history significant for sickle cell disease, chronic pain syndrome, history of asthma, history of eczema, opiate tolerance and dependence, history of pulmonary embolism on Xarelto, and history of anemia of chronic disease was admitted in sickle cell pain crisis.  Patient continues to have pain, primarily to mid chest characterized as constant and throbbing.  Pain intensity is 8/10.  Patient's hemoglobin is 6.4, which is below baseline.  Baseline appears to be 7.0-8.0 g/dL. Patient endorses fatigue.  She denies headache, dizziness, shortness of breath, urinary symptoms, nausea, vomiting, or diarrhea.  Objective:  Vital signs in last 24 hours:  Vitals:   06/28/19 1155 06/28/19 1508 06/28/19 1537 06/28/19 1540  BP:  103/72    Pulse:  96    Resp: 16 11 10 10   Temp:  99.9 F (37.7 C)    TempSrc:  Oral    SpO2: 92% 93% 94% 93%  Weight:      Height:        Intake/Output from previous day:   Intake/Output Summary (Last 24 hours) at 06/28/2019 1659 Last data filed at 06/28/2019 1200 Gross per 24 hour  Intake 970.86 ml  Output 400 ml  Net 570.86 ml    Physical Exam: General: Alert, awake, oriented x3, in no acute distress.  HEENT: [place]/AT PEERL, EOMI Neck: Trachea midline,  no masses, no thyromegal,y no JVD, no carotid bruit OROPHARYNX:  Moist, No exudate/ erythema/lesions.  Heart: Regular rate and rhythm, without murmurs, rubs, gallops, PMI non-displaced, no heaves or thrills on palpation.  Lungs: Clear to auscultation, no wheezing or rhonchi noted. No increased vocal fremitus resonant to percussion  Abdomen: Soft, nontender, nondistended, positive bowel sounds, no masses no hepatosplenomegaly noted..  Neuro: No focal neurological deficits noted cranial nerves II through XII grossly intact. DTRs 2+ bilaterally upper and lower extremities. Strength 5 out of 5 in bilateral upper and lower extremities. Musculoskeletal:  No warm swelling or erythema around joints, no spinal tenderness noted. Psychiatric: Patient alert and oriented x3, good insight and cognition, good recent to remote recall. Lymph node survey: No cervical axillary or inguinal lymphadenopathy noted.  Lab Results:  Basic Metabolic Panel:    Component Value Date/Time   NA 136 06/26/2019 2229   K 3.8 06/26/2019 2229   CL 107 06/26/2019 2229   CO2 23 06/26/2019 2229   BUN 7 06/26/2019 2229   CREATININE 0.44 06/26/2019 2229   GLUCOSE 98 06/26/2019 2229   CALCIUM 9.0 06/26/2019 2229   CBC:    Component Value Date/Time   WBC 18.1 (H) 06/28/2019 0827   HGB 6.4 (LL) 06/28/2019 0827   HCT 19.0 (L) 06/28/2019 0827   PLT 596 (H) 06/28/2019 0827   MCV 87.2 06/28/2019 0827   NEUTROABS 10.9 (H) 06/26/2019 2229   LYMPHSABS 3.6 06/26/2019 2229   MONOABS 2.2 (H) 06/26/2019 2229   EOSABS 0.1 06/26/2019 2229   BASOSABS 0.1 06/26/2019 2229    Recent Results (from the past 240 hour(s))  SARS CORONAVIRUS 2 (TAT 6-24 HRS) Nasopharyngeal Nasopharyngeal Swab     Status: None   Collection Time: 06/27/19  4:33 AM   Specimen: Nasopharyngeal Swab  Result Value Ref Range Status   SARS Coronavirus 2 NEGATIVE NEGATIVE Final    Comment: (NOTE) SARS-CoV-2 target nucleic acids are NOT DETECTED. The SARS-CoV-2 RNA is generally detectable in upper and lower respiratory specimens during the acute phase of infection. Negative results do not preclude SARS-CoV-2 infection, do not rule out co-infections with  other pathogens, and should not be used as the sole basis for treatment or other patient management decisions. Negative results must be combined with clinical observations, patient history, and epidemiological information. The expected result is Negative. Fact Sheet for Patients: SugarRoll.be Fact Sheet for Healthcare Providers: https://www.woods-mathews.com/ This test is not yet approved or cleared by the [place] FDA and  has been authorized for detection and/or diagnosis of SARS-CoV-2 by FDA under an Emergency Use Authorization (EUA). This EUA will remain  in effect (meaning this test can be used) for the duration of the COVID-19 declaration under Section 56 4(b)(1) of the Act, 21 U.S.C. section 360bbb-3(b)(1), unless the authorization is terminated or revoked sooner. Performed at [facility], [place] [address] [place], [place] [address]     Studies/Results: DG Chest Port 1 View  Result Date: 06/26/2019 CLINICAL DATA:  39 year old female with sickle cell anemia and chest pain. EXAM: PORTABLE CHEST 1 VIEW COMPARISON:  Chest radiograph dated 06/23/2019. FINDINGS: Similar positioning of the bilateral port. The lungs are clear. There is no pleural effusion or pneumothorax. Mild cardiomegaly. No acute osseous pathology. IMPRESSION: 1. No acute cardiopulmonary process. 2. Mild cardiomegaly. Electronically Signed   By: Anner Crete M.D.   On: 06/26/2019 22:56    Medications: Scheduled Meds: . acetaminophen  650 mg Oral Once  . Chlorhexidine Gluconate Cloth  6 each Topical Daily  . cholecalciferol  1,000 Units Oral Daily  . Deferasirox  1,080 mg Oral Daily  . diphenhydrAMINE  25 mg Intravenous Once  . folic acid  1 mg Oral Daily  . HYDROmorphone   Intravenous Q4H  . ketorolac  15 mg Intravenous Q6H  . mirtazapine  45 mg Oral QHS  . mometasone-formoterol  2 puff Inhalation BID  . pantoprazole  40 mg Oral Daily  . rivaroxaban  20 mg Oral Daily  . senna-docusate  1 tablet Oral BID  . vitamin B-12  1,000 mcg Oral Daily  . voxelotor  1,500 mg Oral Daily   Continuous Infusions: PRN Meds:.albuterol, diphenhydrAMINE, naloxone **AND** sodium chloride flush, ondansetron (ZOFRAN) IV, polyethylene glycol, promethazine  Consultants:  None  Procedures:  None  Antibiotics:  None  Assessment/Plan: Active Problems:   Sickle cell crisis (HCC)   Asthma  Sickle cell disease with  pain crisis: Continue IV fluids at [place] PCA, no change in settings on today. IV Toradol 15 mg every 6 hours for total of 5 days. Monitor vital signs closely and reevaluate pain scale regularly  Chronic pain syndrome: Continue to hold Dilaudid by mouth, use PCA as substitute.  Sickle cell anemia: Hemoglobin is 6.4, decreased from patient's baseline.  Transfuse 1 unit of PRBCs.  Repeat CBC in a.m.  History of PE: Continue Xarelto 20 mg daily  History of mild intermittent asthma: Stable.  Continue home medications.  Leukocytosis: WBCs elevated at 18.1.  Patient afebrile.  No signs of infection or inflammation.  Will continue to monitor closely without antibiotics.  Repeat CBC in a.m.   Code Status: Full Code Family Communication: N/A Disposition Plan: Not yet ready for discharge  [place], MSN, FNP-C Patient [place] [address] [place], [place] [address] [phone]  If 5PM-7AM, please contact night-coverage.  06/28/2019, 4:59 PM  LOS: 1 day

## 2019-06-29 DIAGNOSIS — J9811 Atelectasis: Secondary | ICD-10-CM | POA: Insufficient documentation

## 2019-06-29 DIAGNOSIS — R079 Chest pain, unspecified: Secondary | ICD-10-CM

## 2019-06-29 DIAGNOSIS — N63 Unspecified lump in unspecified breast: Secondary | ICD-10-CM

## 2019-06-29 DIAGNOSIS — D5700 Hb-SS disease with crisis, unspecified: Secondary | ICD-10-CM | POA: Diagnosis not present

## 2019-06-29 LAB — TYPE AND SCREEN
ABO/RH(D): A POS
Antibody Screen: NEGATIVE
Unit division: 0

## 2019-06-29 LAB — BPAM RBC
Blood Product Expiration Date: 202101292359
ISSUE DATE / TIME: 202101112353
Unit Type and Rh: 5100

## 2019-06-29 LAB — CBC
HCT: 23.1 % — ABNORMAL LOW (ref 36.0–46.0)
Hemoglobin: 7.7 g/dL — ABNORMAL LOW (ref 12.0–15.0)
MCH: 29.4 pg (ref 26.0–34.0)
MCHC: 33.3 g/dL (ref 30.0–36.0)
MCV: 88.2 fL (ref 80.0–100.0)
Platelets: 586 10*3/uL — ABNORMAL HIGH (ref 150–400)
RBC: 2.62 MIL/uL — ABNORMAL LOW (ref 3.87–5.11)
RDW: 21.3 % — ABNORMAL HIGH (ref 11.5–15.5)
WBC: 19.5 10*3/uL — ABNORMAL HIGH (ref 4.0–10.5)
nRBC: 0.7 % — ABNORMAL HIGH (ref 0.0–0.2)

## 2019-06-29 MED ORDER — OXYCODONE HCL 5 MG PO TABS
10.0000 mg | ORAL_TABLET | ORAL | Status: DC | PRN
  Administered 2019-06-29 (×2): 10 mg via ORAL
  Filled 2019-06-29 (×3): qty 2

## 2019-06-29 MED ORDER — HYDROMORPHONE 1 MG/ML IV SOLN
INTRAVENOUS | Status: DC
  Administered 2019-06-29: 30 mg via INTRAVENOUS
  Administered 2019-06-29: 0.5 mg via INTRAVENOUS
  Administered 2019-06-30: 3.5 mg via INTRAVENOUS
  Administered 2019-06-30: 0 mg via INTRAVENOUS
  Administered 2019-06-30: 1 mg via INTRAVENOUS
  Filled 2019-06-29: qty 30

## 2019-06-29 NOTE — Progress Notes (Signed)
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  Patient continues to have pain, primarily to mid chest.  Pain intensity is 7/10, which is slightly improved from yesterday.  Patient is status post 1 unit PRBCs.  Hemoglobin has returned to baseline at 7.7.  Patient denies headache, dizziness, shortness of breath, nausea, vomiting, or diarrhea.  She states that fatigue is improving.  She is afebrile saturation is 96% on RA. Objective:  Vital signs in last 24 hours:  Vitals:   06/29/19 1232 06/29/19 1545 06/29/19 1833 06/29/19 2016  BP:   117/81 121/79  Pulse:   97 96  Resp: 12 14 10 16   Temp:   99.6 F (37.6 C) 99.4 F (37.4 C)  TempSrc:   Oral Oral  SpO2: 94% 96% 94% 94%  Weight:      Height:        Intake/Output from previous day:   Intake/Output Summary (Last 24 hours) at 06/29/2019 2022 Last data filed at 06/29/2019 1345 Gross per 24 hour  Intake 955 ml  Output --  Net 955 ml    Physical Exam: General: Alert, awake, oriented x3, in no acute distress.  HEENT: [place]/AT PEERL, EOMI Neck: Trachea midline,  no masses, no thyromegal,y no JVD, no carotid bruit OROPHARYNX:  Moist, No exudate/ erythema/lesions.  Heart: Regular rate and rhythm, without murmurs, rubs, gallops, PMI non-displaced, no heaves or thrills on palpation.  Lungs: Clear to auscultation, no wheezing or rhonchi noted. No increased vocal fremitus resonant to percussion  Abdomen: Soft, nontender, nondistended, positive bowel sounds, no masses no hepatosplenomegaly noted..  Neuro: No focal neurological deficits noted cranial nerves II through XII grossly intact. DTRs 2+ bilaterally upper and lower extremities. Strength 5 out of 5 in  bilateral upper and lower extremities. Musculoskeletal: No warm swelling or erythema around joints, no spinal tenderness noted. Psychiatric: Patient alert and oriented x3, good insight and cognition, good recent to remote recall. Lymph node survey: No cervical axillary or inguinal lymphadenopathy noted.  Lab Results:  Basic Metabolic Panel:    Component Value Date/Time   NA 136 06/26/2019 2229   K 3.8 06/26/2019 2229   CL 107 06/26/2019 2229   CO2 23 06/26/2019 2229   BUN 7 06/26/2019 2229   CREATININE 0.44 06/26/2019 2229   GLUCOSE 98 06/26/2019 2229   CALCIUM 9.0 06/26/2019 2229   CBC:    Component Value Date/Time   WBC 19.5 (H) 06/29/2019 0620   HGB 7.7 (L) 06/29/2019 0620   HCT 23.1 (L) 06/29/2019 0620   PLT 586 (H) 06/29/2019 0620   MCV 88.2 06/29/2019 0620   NEUTROABS 10.9 (H) 06/26/2019 2229   LYMPHSABS 3.6 06/26/2019 2229   MONOABS 2.2 (H) 06/26/2019 2229   EOSABS 0.1 06/26/2019 2229   BASOSABS 0.1 06/26/2019 2229    Recent Results (from the past 240 hour(s))  SARS CORONAVIRUS 2 (TAT 6-24 HRS) Nasopharyngeal Nasopharyngeal Swab     Status: None   Collection Time: 06/27/19  4:33 AM   Specimen: Nasopharyngeal Swab  Result Value Ref Range Status   SARS Coronavirus 2 NEGATIVE NEGATIVE Final    Comment: (NOTE) SARS-CoV-2 target nucleic acids are NOT DETECTED. The SARS-CoV-2 RNA is generally detectable in upper and lower respiratory specimens during the acute phase of infection.  Negative results do not preclude SARS-CoV-2 infection, do not rule out co-infections with other pathogens, and should not be used as the sole basis for treatment or other patient management decisions. Negative results must be combined with clinical observations, patient history, and epidemiological information. The expected result is Negative. Fact Sheet for Patients: SugarRoll.be Fact Sheet for Healthcare  Providers: https://www.woods-mathews.com/ This test is not yet approved or cleared by the [place] FDA and  has been authorized for detection and/or diagnosis of SARS-CoV-2 by FDA under an Emergency Use Authorization (EUA). This EUA will remain  in effect (meaning this test can be used) for the duration of the COVID-19 declaration under Section 56 4(b)(1) of the Act, 21 U.S.C. section 360bbb-3(b)(1), unless the authorization is terminated or revoked sooner. Performed at [facility], [place] [address] [place], [place] [address]     Studies/Results: No results found.  Medications: Scheduled Meds: . Chlorhexidine Gluconate Cloth  6 each Topical Daily  . cholecalciferol  1,000 Units Oral Daily  . Deferasirox  1,080 mg Oral Daily  . folic acid  1 mg Oral Daily  . HYDROmorphone   Intravenous Q4H  . ketorolac  15 mg Intravenous Q6H  . mirtazapine  45 mg Oral QHS  . mometasone-formoterol  2 puff Inhalation BID  . pantoprazole  40 mg Oral Daily  . rivaroxaban  20 mg Oral Daily  . senna-docusate  1 tablet Oral BID  . vitamin B-12  1,000 mcg Oral Daily  . voxelotor  1,500 mg Oral Daily   Continuous Infusions: PRN Meds:.acetaminophen, albuterol, diphenhydrAMINE, diphenhydrAMINE, naloxone **AND** sodium chloride flush, ondansetron (ZOFRAN) IV, oxyCODONE, polyethylene glycol, promethazine  Consultants:  None  Procedures:  None  Antibiotics:  None  Assessment/Plan: Active Problems:   Sickle cell crisis (HCC)   Asthma   Central chest pain  Sickle cell disease with pain crisis: Continue IV fluids at [place] IV Dilaudid PCA, settings changed to 0.5 mg, 10-minute lockout, and 2 mg/h. Oxycodone 10 mg every 4 hours as needed for severe breakthrough pain IV Toradol 15 mg every 6 hours for total of 5 days Monitor vital signs closely and reevaluate pain scale regularly.  Chronic pain syndrome: Continue to hold Dilaudid by mouth, use PCA as  substitute  Sickle cell anemia: Hemoglobin 7.7, improved and is consistent with baseline.  Patient is status post 1 unit of PRBCs.  No clinical indication for blood transfusion at this time.  Continue to monitor closely.  CBC in a.m.  History of PE: Continue Xarelto 20 mg daily  History of mild intermittent asthma: Stable.  Continue home medications.  Leukocytosis: WBCs 19 K.  Patient afebrile.  No signs of infection or inflammation.  Suspected to be reactive.  Continue to monitor closely without antibiotics.  Repeat CBC in a.m.   Code Status: Full Code Family Communication: N/A Disposition Plan: Not yet ready for discharge  [place], MSN, FNP-C Patient [place] [address] [place], [place] [address] [phone]  If 5PM-7AM, please contact night-coverage.  06/29/2019, 8:22 PM  LOS: 2 days

## 2019-06-30 DIAGNOSIS — J4520 Mild intermittent asthma, uncomplicated: Secondary | ICD-10-CM | POA: Diagnosis not present

## 2019-06-30 DIAGNOSIS — D5700 Hb-SS disease with crisis, unspecified: Secondary | ICD-10-CM | POA: Diagnosis not present

## 2019-06-30 DIAGNOSIS — R079 Chest pain, unspecified: Secondary | ICD-10-CM | POA: Diagnosis not present

## 2019-06-30 MED ORDER — HYDROMORPHONE 1 MG/ML IV SOLN
INTRAVENOUS | Status: DC
  Administered 2019-06-30: 0 mg via INTRAVENOUS
  Administered 2019-06-30: 4 mg via INTRAVENOUS
  Administered 2019-07-01: 5 mg via INTRAVENOUS
  Administered 2019-07-01: 30 mg via INTRAVENOUS
  Administered 2019-07-01: 4 mg via INTRAVENOUS
  Administered 2019-07-01: 4.5 mg via INTRAVENOUS
  Filled 2019-06-30: qty 30

## 2019-06-30 MED ORDER — PROMETHAZINE HCL 25 MG/ML IJ SOLN
12.5000 mg | INTRAMUSCULAR | Status: DC | PRN
  Administered 2019-06-30 – 2019-07-02 (×8): 12.5 mg via INTRAVENOUS
  Filled 2019-06-30 (×8): qty 1

## 2019-06-30 MED ORDER — OXYCODONE HCL ER 10 MG PO T12A
10.0000 mg | EXTENDED_RELEASE_TABLET | Freq: Two times a day (BID) | ORAL | Status: DC
  Administered 2019-06-30 – 2019-07-01 (×3): 10 mg via ORAL
  Filled 2019-06-30 (×4): qty 1

## 2019-06-30 MED ORDER — OXYCODONE HCL 5 MG PO TABS
10.0000 mg | ORAL_TABLET | Freq: Four times a day (QID) | ORAL | Status: DC | PRN
  Administered 2019-07-01: 10 mg via ORAL
  Filled 2019-06-30: qty 2

## 2019-06-30 NOTE — Care Management Important Message (Signed)
Important Message  Patient Details IMLetter given to Marney Doctor RN Case Manager to present to the Patient Name: Molly Gutierrez MRN: [id number] Date of Birth: 1980-12-29   Medicare Important Message Given:  Yes     Kerin Salen 06/30/2019, 11:02 AM

## 2019-06-30 NOTE — Progress Notes (Signed)
Subjective:  Molly Gutierrez is a 39 year old female with a medical history significant for sickle cell disease, chronic pain syndrome, opiate dependence and tolerance, history of PE on Xarelto, and history of mild intermittent asthma was admitted for sickle cell pain crisis.  Patient states the pain intensity has not improved overnight.  Pain is localized to mid chest.  She characterizes pain as cough and and aching.  Pain intensity is 8/10. Patient denies headache, chest pain, shortness of breath, urinary symptoms, nausea.   Objective:  Vital signs in last 24 hours:  Vitals:   06/30/19 1320 06/30/19 1338 06/30/19 1528 06/30/19 1742  BP:  115/81  128/85  Pulse:  82  80  Resp: 12 14 15 13   Temp:  98.8 F (37.1 C)  98.6 F (37 C)  TempSrc:  Oral  Oral  SpO2: 93% 93% 96% 93%  Weight:      Height:        Intake/Output from previous day:   Intake/Output Summary (Last 24 hours) at 06/30/2019 1935 Last data filed at 06/30/2019 0800 Gross per 24 hour  Intake 0 ml  Output -  Net 0 ml    Physical Exam: General: Alert, awake, oriented x3, in no acute distress.  HEENT: [place]/AT PEERL, EOMI Neck: Trachea midline,  no masses, no thyromegal,y no JVD, no carotid bruit OROPHARYNX:  Moist, No exudate/ erythema/lesions.  Heart: Regular rate and rhythm, without murmurs, rubs, gallops, PMI non-displaced, no heaves or thrills on palpation.  Lungs: Clear to auscultation, no wheezing or rhonchi noted. No increased vocal fremitus resonant to percussion  Abdomen: Soft, nontender, nondistended, positive bowel sounds, no masses no hepatosplenomegaly noted..  Neuro: No focal neurological deficits noted cranial nerves II through XII grossly intact. DTRs 2+ bilaterally upper and lower extremities. Strength 5 out of 5 in bilateral upper and lower extremities. Musculoskeletal: No warm swelling or erythema around joints, no spinal tenderness noted. Psychiatric: Patient alert and oriented x3, good insight  and cognition, good recent to remote recall. Lymph node survey: No cervical axillary or inguinal lymphadenopathy noted.  Lab Results:  Basic Metabolic Panel:    Component Value Date/Time   NA 136 06/26/2019 2229   K 3.8 06/26/2019 2229   CL 107 06/26/2019 2229   CO2 23 06/26/2019 2229   BUN 7 06/26/2019 2229   CREATININE 0.44 06/26/2019 2229   GLUCOSE 98 06/26/2019 2229   CALCIUM 9.0 06/26/2019 2229   CBC:    Component Value Date/Time   WBC 19.5 (H) 06/29/2019 0620   HGB 7.7 (L) 06/29/2019 0620   HCT 23.1 (L) 06/29/2019 0620   PLT 586 (H) 06/29/2019 0620   MCV 88.2 06/29/2019 0620   NEUTROABS 10.9 (H) 06/26/2019 2229   LYMPHSABS 3.6 06/26/2019 2229   MONOABS 2.2 (H) 06/26/2019 2229   EOSABS 0.1 06/26/2019 2229   BASOSABS 0.1 06/26/2019 2229    Recent Results (from the past 240 hour(s))  SARS CORONAVIRUS 2 (TAT 6-24 HRS) Nasopharyngeal Nasopharyngeal Swab     Status: None   Collection Time: 06/27/19  4:33 AM   Specimen: Nasopharyngeal Swab  Result Value Ref Range Status   SARS Coronavirus 2 NEGATIVE NEGATIVE Final    Comment: (NOTE) SARS-CoV-2 target nucleic acids are NOT DETECTED. The SARS-CoV-2 RNA is generally detectable in upper and lower respiratory specimens during the acute phase of infection. Negative results do not preclude SARS-CoV-2 infection, do not rule out co-infections with other pathogens, and should not be used as the sole basis for treatment or  other patient management decisions. Negative results must be combined with clinical observations, patient history, and epidemiological information. The expected result is Negative. Fact Sheet for Patients: SugarRoll.be Fact Sheet for Healthcare Providers: https://www.woods-mathews.com/ This test is not yet approved or cleared by the [place] FDA and  has been authorized for detection and/or diagnosis of SARS-CoV-2 by FDA under an Emergency Use Authorization  (EUA). This EUA will remain  in effect (meaning this test can be used) for the duration of the COVID-19 declaration under Section 56 4(b)(1) of the Act, 21 U.S.C. section 360bbb-3(b)(1), unless the authorization is terminated or revoked sooner. Performed at [facility], [place] [address] [place], [place] [address]     Studies/Results: No results found.  Medications: Scheduled Meds: . Chlorhexidine Gluconate Cloth  6 each Topical Daily  . cholecalciferol  1,000 Units Oral Daily  . Deferasirox  1,080 mg Oral Daily  . folic acid  1 mg Oral Daily  . HYDROmorphone   Intravenous Q4H  . ketorolac  15 mg Intravenous Q6H  . mirtazapine  45 mg Oral QHS  . mometasone-formoterol  2 puff Inhalation BID  . oxyCODONE  10 mg Oral Q12H  . pantoprazole  40 mg Oral Daily  . rivaroxaban  20 mg Oral Daily  . senna-docusate  1 tablet Oral BID  . vitamin B-12  1,000 mcg Oral Daily  . voxelotor  1,500 mg Oral Daily   Continuous Infusions: PRN Meds:.acetaminophen, albuterol, diphenhydrAMINE, diphenhydrAMINE, naloxone **AND** sodium chloride flush, oxyCODONE, polyethylene glycol, promethazine  Consultants: None Procedures: None  Antibiotics: None  Assessment/Plan: Active Problems:   Sickle cell crisis (HCC)   Asthma   Central chest pain  Sickle cell disease with pain crisis:  Weaning IV Dilaudid PCA, settings changed to 0.5 mg, 10-minute lockout, and 1.5 mg/h. Initiated OxyContin 10 mg every 12 hours for greater pain control Oxycodone 10 mg every 6 hours as needed for severe, breakthrough pain IV Toradol 15 mg every 6 hours for total of 5 days Monitor vital signs closely and reevaluate pain scale regularly  Chronic pain syndrome: Continue home medications  Sickle cell anemia: Hemoglobin stable and consistent with baseline.  Patient is status post 1 unit of PRBCs.  There is no clinical indication for additional blood transfusions at this time.  Continue to monitor  closely.  History of PE: Continue Xarelto 20 mg daily  History of mild intermittent asthma: Stable.  Continue home medications.  Leukocytosis: Improving.  Patient afebrile and there are no signs of infection or inflammation.  Continue to monitor closely without antibiotics.  Repeat CBC in a.m. Code Status: Full Code Family Communication: N/A Disposition Plan: Not yet ready for discharge  [place], MSN, FNP-C Patient [place] [address] [place], [place] [address] [phone]  If 5PM-7AM, please contact night-coverage.  06/30/2019, 7:35 PM  LOS: 3 days Regularly now 1 month ago ago asthma baby my baby

## 2019-07-01 DIAGNOSIS — R079 Chest pain, unspecified: Secondary | ICD-10-CM | POA: Diagnosis not present

## 2019-07-01 DIAGNOSIS — G894 Chronic pain syndrome: Secondary | ICD-10-CM | POA: Diagnosis not present

## 2019-07-01 DIAGNOSIS — D5700 Hb-SS disease with crisis, unspecified: Secondary | ICD-10-CM | POA: Diagnosis not present

## 2019-07-01 LAB — CBC
HCT: 22.9 % — ABNORMAL LOW (ref 36.0–46.0)
Hemoglobin: 7.5 g/dL — ABNORMAL LOW (ref 12.0–15.0)
MCH: 29.1 pg (ref 26.0–34.0)
MCHC: 32.8 g/dL (ref 30.0–36.0)
MCV: 88.8 fL (ref 80.0–100.0)
Platelets: 574 10*3/uL — ABNORMAL HIGH (ref 150–400)
RBC: 2.58 MIL/uL — ABNORMAL LOW (ref 3.87–5.11)
RDW: 21.3 % — ABNORMAL HIGH (ref 11.5–15.5)
WBC: 13.7 10*3/uL — ABNORMAL HIGH (ref 4.0–10.5)
nRBC: 0.7 % — ABNORMAL HIGH (ref 0.0–0.2)

## 2019-07-01 LAB — BASIC METABOLIC PANEL
Anion gap: 6 (ref 5–15)
BUN: 9 mg/dL (ref 6–20)
CO2: 26 mmol/L (ref 22–32)
Calcium: 8.2 mg/dL — ABNORMAL LOW (ref 8.9–10.3)
Chloride: 105 mmol/L (ref 98–111)
Creatinine, Ser: 0.5 mg/dL (ref 0.44–1.00)
GFR calc Af Amer: >60 mL/min (ref >60)
GFR calc non Af Amer: >60 mL/min (ref >60)
Glucose, Bld: 99 mg/dL (ref 70–99)
Potassium: 4 mmol/L (ref 3.5–5.1)
Sodium: 137 mmol/L (ref 135–145)

## 2019-07-01 MED ORDER — HYDROMORPHONE 1 MG/ML IV SOLN
INTRAVENOUS | Status: DC
  Administered 2019-07-01: 0 mg via INTRAVENOUS
  Administered 2019-07-01: 1 mg via INTRAVENOUS
  Administered 2019-07-01: 0.5 mg via INTRAVENOUS
  Administered 2019-07-02: 3 mg via INTRAVENOUS
  Administered 2019-07-02: 2 mg via INTRAVENOUS
  Administered 2019-07-02: 3.5 mg via INTRAVENOUS
  Administered 2019-07-02: 0 mg via INTRAVENOUS

## 2019-07-01 MED ORDER — OXYCODONE HCL 5 MG PO TABS
10.0000 mg | ORAL_TABLET | ORAL | Status: DC
  Administered 2019-07-02 (×3): 10 mg via ORAL
  Filled 2019-07-01 (×4): qty 2

## 2019-07-01 NOTE — Progress Notes (Signed)
Subjective: [facility], a 39 year old female with a medical history significant for sickle cell disease, chronic pain syndrome, opiate dependence and tolerance, history of PE on Xarelto, and history of mild intermittent asthma was admitted for sickle cell pain crisis.  Patient states that pain intensity has improved some overnight.  Pain is localized to mid chest.  Pain intensity is 6/10. Patient denies headache, dizziness, shortness of breath, urinary symptoms, nausea, vomiting, or diarrhea.  Objective:  Vital signs in last 24 hours:  Vitals:   07/01/19 0829 07/01/19 1021 07/01/19 1241 07/01/19 1309  BP:  116/82  108/71  Pulse:  89  79  Resp: 11 16 11 17   Temp:  98.7 F (37.1 C)  98.9 F (37.2 C)  TempSrc:  Oral  Oral  SpO2: 94% 95% 95% 97%  Weight:      Height:        Intake/Output from previous day:  No intake or output data in the 24 hours ending 07/01/19 1333  Physical Exam: General: Alert, awake, oriented x3, in no acute distress.  HEENT: [place]/AT PEERL, EOMI Neck: Trachea midline,  no masses, no thyromegal,y no JVD, no carotid bruit OROPHARYNX:  Moist, No exudate/ erythema/lesions.  Heart: Regular rate and rhythm, without murmurs, rubs, gallops, PMI non-displaced, no heaves or thrills on palpation.  Lungs: Clear to auscultation, no wheezing or rhonchi noted. No increased vocal fremitus resonant to percussion  Abdomen: Soft, nontender, nondistended, positive bowel sounds, no masses no hepatosplenomegaly noted..  Neuro: No focal neurological deficits noted cranial nerves II through XII grossly intact. DTRs 2+ bilaterally upper and lower extremities. Strength 5 out of 5 in bilateral upper and lower extremities. Musculoskeletal: No warm swelling or erythema around joints, no spinal tenderness noted. Psychiatric: Patient alert and oriented x3, good insight and cognition, good recent to remote recall. Lymph node survey: No cervical axillary or inguinal lymphadenopathy  noted.  Lab Results:  Basic Metabolic Panel:    Component Value Date/Time   NA 137 07/01/2019 0647   K 4.0 07/01/2019 0647   CL 105 07/01/2019 0647   CO2 26 07/01/2019 0647   BUN 9 07/01/2019 0647   CREATININE 0.50 07/01/2019 0647   GLUCOSE 99 07/01/2019 0647   CALCIUM 8.2 (L) 07/01/2019 0647   CBC:    Component Value Date/Time   WBC 13.7 (H) 07/01/2019 0647   HGB 7.5 (L) 07/01/2019 0647   HCT 22.9 (L) 07/01/2019 0647   PLT 574 (H) 07/01/2019 0647   MCV 88.8 07/01/2019 0647   NEUTROABS 10.9 (H) 06/26/2019 2229   LYMPHSABS 3.6 06/26/2019 2229   MONOABS 2.2 (H) 06/26/2019 2229   EOSABS 0.1 06/26/2019 2229   BASOSABS 0.1 06/26/2019 2229    Recent Results (from the past 240 hour(s))  SARS CORONAVIRUS 2 (TAT 6-24 HRS) Nasopharyngeal Nasopharyngeal Swab     Status: None   Collection Time: 06/27/19  4:33 AM   Specimen: Nasopharyngeal Swab  Result Value Ref Range Status   SARS Coronavirus 2 NEGATIVE NEGATIVE Final    Comment: (NOTE) SARS-CoV-2 target nucleic acids are NOT DETECTED. The SARS-CoV-2 RNA is generally detectable in upper and lower respiratory specimens during the acute phase of infection. Negative results do not preclude SARS-CoV-2 infection, do not rule out co-infections with other pathogens, and should not be used as the sole basis for treatment or other patient management decisions. Negative results must be combined with clinical observations, patient history, and epidemiological information. The expected result is Negative. Fact Sheet for Patients: SugarRoll.be Fact Sheet  for Healthcare Providers: https://www.woods-mathews.com/ This test is not yet approved or cleared by the [place] and  has been authorized for detection and/or diagnosis of SARS-CoV-2 by FDA under an Emergency Use Authorization (EUA). This EUA will remain  in effect (meaning this test can be used) for the duration of the COVID-19  declaration under Section 56 4(b)(1) of the Act, 21 U.S.C. section 360bbb-3(b)(1), unless the authorization is terminated or revoked sooner. Performed at [facility], [place] [address] [place], [place] [address]     Studies/Results: No results found.  Medications: Scheduled Meds: . Chlorhexidine Gluconate Cloth  6 each Topical Daily  . cholecalciferol  1,000 Units Oral Daily  . Deferasirox  1,080 mg Oral Daily  . folic acid  1 mg Oral Daily  . HYDROmorphone   Intravenous Q4H  . ketorolac  15 mg Intravenous Q6H  . mirtazapine  45 mg Oral QHS  . mometasone-formoterol  2 puff Inhalation BID  . oxyCODONE  10 mg Oral Q4H while awake  . oxyCODONE  10 mg Oral Q12H  . pantoprazole  40 mg Oral Daily  . rivaroxaban  20 mg Oral Daily  . senna-docusate  1 tablet Oral BID  . vitamin B-12  1,000 mcg Oral Daily  . voxelotor  1,500 mg Oral Daily   Continuous Infusions: PRN Meds:.acetaminophen, albuterol, diphenhydrAMINE, diphenhydrAMINE, naloxone **AND** sodium chloride flush, polyethylene glycol, promethazine  Consultants:  None  Procedures:  None  Antibiotics:  None  Assessment/Plan: Active Problems:   Chronic pain syndrome   Sickle cell crisis (HCC)   Asthma   Central chest pain  Sickle cell disease with pain crisis: Continue weaning IV Dilaudid PCA, settings changed to 0.5 mg, 10-minute lockout, and 1 mg/h. OxyContin 10 mg every 12 hours Oxycodone 10 mg every 4 hours while awake IV Toradol 15 mg every 6 hours for total of 5 days. Monitor vital signs closely and reevaluate pain scale regularly  Chronic pain syndrome: Continue home medications.  Sickle cell anemia: Hemoglobin stable and consistent with patient's baseline.  She is status post 1 unit of packed red blood cells.  There is no clinical indication for additional blood transfusions at this time.  Continue to monitor closely.  History of PE: Continue Xarelto 20 mg daily  History of mild intermittent  asthma: Stable.  Continue home medications.  Leukocytosis: Improving.  Patient afebrile and there are no signs of infection or inflammation.  Continue to monitor closely without antibiotics.  Follow CBC.   Code Status: Full Code Family Communication: N/A Disposition Plan: Not yet ready for discharge  [place], MSN, FNP-C Patient [place] [address] [place], [place] [address] [phone]  If 5PM-7AM, please contact night-coverage.  07/01/2019, 1:33 PM  LOS: 4 days

## 2019-07-02 DIAGNOSIS — R079 Chest pain, unspecified: Secondary | ICD-10-CM | POA: Diagnosis not present

## 2019-07-02 DIAGNOSIS — D5700 Hb-SS disease with crisis, unspecified: Secondary | ICD-10-CM | POA: Diagnosis not present

## 2019-07-02 DIAGNOSIS — D72829 Elevated white blood cell count, unspecified: Secondary | ICD-10-CM | POA: Diagnosis not present

## 2019-07-02 DIAGNOSIS — Z86711 Personal history of pulmonary embolism: Secondary | ICD-10-CM

## 2019-07-02 DIAGNOSIS — G894 Chronic pain syndrome: Secondary | ICD-10-CM | POA: Diagnosis not present

## 2019-07-02 MED ORDER — OXYCODONE HCL 10 MG PO TABS: 10.0000 mg | ORAL_TABLET | ORAL | 0 refills | Status: DC

## 2019-07-02 MED ORDER — ACETAMINOPHEN 325 MG PO TABS: 650.0000 mg | ORAL_TABLET | Freq: Four times a day (QID) | ORAL | Status: DC | PRN

## 2019-07-02 MED ORDER — OXYCODONE HCL ER 10 MG PO T12A: 10.0000 mg | EXTENDED_RELEASE_TABLET | Freq: Two times a day (BID) | ORAL | 0 refills | Status: DC

## 2019-07-02 NOTE — Discharge Instructions (Addendum)
Oxycontin 10 mg every 12 hours per day.  Oxycodone 10 mg every 4 hours as needed for severe breakthrough pain.   Sickle cell day infusion center- [phone] M-F 8am-12, patients are accepted for day admission. Clinic is open from 8-4:30.            Sickle Cell Anemia, Adult  Sickle cell anemia is a condition where your red blood cells are shaped like sickles. Red blood cells carry oxygen through the body. Sickle-shaped cells do not live as long as normal red blood cells. They also clump together and block blood from flowing through the blood vessels. This prevents the body from getting enough oxygen. Sickle cell anemia causes organ damage and pain. It also increases the risk of infection. Follow these instructions at home: Medicines  Take over-the-counter and prescription medicines only as told by your doctor.  If you were prescribed an antibiotic medicine, take it as told by your doctor. Do not stop taking the antibiotic even if you start to feel better.  If you develop a fever, do not take medicines to lower the fever right away. Tell your doctor about the fever. Managing pain, stiffness, and swelling  Try these methods to help with pain: ? Use a heating pad. ? Take a warm bath. ? Distract yourself, such as by watching TV. Eating and drinking  Drink enough fluid to keep your pee (urine) clear or pale yellow. Drink more in hot weather and during exercise.  Limit or avoid alcohol.  Eat a healthy diet. Eat plenty of fruits, vegetables, whole grains, and lean protein.  Take vitamins and supplements as told by your doctor. Traveling  When traveling, keep these with you: ? Your medical information. ? The names of your doctors. ? Your medicines.  If you need to take an airplane, talk to your doctor first. Activity  Rest often.  Avoid exercises that make your heart beat much faster, such as jogging. General instructions  Do not use products that have nicotine or  tobacco, such as cigarettes and e-cigarettes. If you need help quitting, ask your doctor.  Consider wearing a medical alert bracelet.  Avoid being in high places (high altitudes), such as mountains.  Avoid very hot or cold temperatures.  Avoid places where the temperature changes a lot.  Keep all follow-up visits as told by your doctor. This is important. Contact a doctor if:  A joint hurts.  Your feet or hands hurt or swell.  You feel tired (fatigued). Get help right away if:  You have symptoms of infection. These include: ? Fever. ? Chills. ? Being very tired. ? Irritability. ? Poor eating. ? Throwing up (vomiting).  You feel dizzy or faint.  You have new stomach pain, especially on the left side.  You have a an erection (priapism) that lasts more than 4 hours.  You have numbness in your arms or legs.  You have a hard time moving your arms or legs.  You have trouble talking.  You have pain that does not go away when you take medicine.  You are short of breath.  You are breathing fast.  You have a long-term cough.  You have pain in your chest.  You have a bad headache.  You have a stiff neck.  Your stomach looks bloated even though you did not eat much.  Your skin is pale.  You suddenly cannot see well. Summary  Sickle cell anemia is a condition where your red blood cells are shaped like  sickles.  Follow your doctor's advice on ways to manage pain, food to eat, activities to do, and steps to take for safe travel.  Get medical help right away if you have any signs of infection, such as a fever. This information is not intended to replace advice given to you by your health care provider. Make sure you discuss any questions you have with your health care provider. Document Revised: 09/25/2018 Document Reviewed: 07/09/2016 Elsevier Patient Education  [place].

## 2019-07-02 NOTE — Discharge Summary (Signed)
Physician Discharge Summary  [facility] DQ:[id number] DOB: 1980-08-26 DOA: 06/26/2019  PCP: Rosana Berger, FNP  Admit date: 06/26/2019  Discharge date: 07/02/2019  Discharge Diagnoses:  Active Problems:   Chronic pain syndrome   Sickle cell crisis (HCC)   Asthma   Central chest pain   Discharge Condition: Stable  Disposition:  Follow-up Information    Vevelyn Francois, NP Follow up.   Specialty: Adult Health Nurse Practitioner Contact information: [address] [place] [address] [phone]          Pt is discharged home in good condition and is to follow up with Dionisio David FNP in 1 week to have labs evaluated. Nikyah [place] is instructed to increase activity slowly and balance with rest for the next few days, and use prescribed medication to complete treatment of pain  Diet: Regular Wt Readings from Last 3 Encounters:  06/29/19 57 kg  06/23/19 55.8 kg  06/08/19 54.4 kg    History of present illness:  [facility], a 39 year old female with a medical history significant for sickle cell disease, mild intermittent asthma, eczema, anemia of chronic disease, and history of PE on Xarelto presented to the emergency department with chest pain and leg pain ongoing for 2 days.  Pain is similar to previous acute pain crisis.  Patient attributes current crisis to weather changes.  Home opiate medications have not been sufficient to control pain.  She denies dyspnea, palpitations, abdominal pain or diarrhea.  She complains of nausea/vomiting, which is typical with her sickle cell crisis.  She also has a history of acute chest syndrome, but this episode does not feel like it.  She has a history of pulmonary embolism and is compliant with Xarelto.  She usually has crisis every 1 to 2 months, sometimes requiring admission.  Patient reported that she typically gets 1-2 mg IV Dilaudid, Benadryl, and Phenergan during her crisis.  ER course: While in  the emergency department she was found to have leukocytosis with a WBC count of 17,000, hemoglobin 7.5.  Hemodynamically stable.  Received 3 doses of 2 mg IV Dilaudid and 1 dose of 30 mg IV Toradol, which improved her pain some, but did not desaturate.  Hospitalist notified for admission for acute sickle cell pain crisis.  Hospital Course: Sickle cell disease with pain crisis: Patient was admitted for sickle cell pain crisis and managed appropriately with IVF, IV Dilaudid via PCA and IV Toradol, as well as other adjunct therapies per sickle cell pain management protocols.  IV Dilaudid PCA was weaned appropriately. Patient's pain is been poorly controlled with oral Dilaudid.  She has had 3 hospital admissions over the past 3 months.  Also, patient has had frequent ER visits.  Oral Dilaudid discontinued.  OxyContin 10 mg every 12 hours and oxycodone 10 mg every 4 hours was initiated while in hospital.  Pain intensity decreased on new medication regimen.  OxyContin 10 mg #14 and oxycodone 10 mg #42 sent to patient's pharmacy.  Also, schedule an appointment to establish care with PCP for medication management and to repeat labs in 1 week. Patient typically resides in [place], but has been living with her dad and stepmom locally.  She plans on moving to this area when her children complete school this semester. Patient has been lost to follow-up with hematology.  She has an appointment scheduled with [facility] to reestablish care. Pain intensity decreased to 5/10.  Patient is alert, oriented, and ambulating without assistance.  She is afebrile and oxygen saturation is 100% on RA.  She is aware of all upcoming appointments, and she was given information pertaining to sickle cell day infusion center.  Discussed operation of sickle cell day infusion center at length, discussed as a  alternative to ER.  Patient was discharged home today in a hemodynamically stable condition.   Discharge Exam: Vitals:    07/02/19 0802 07/02/19 1019  BP:  124/83  Pulse:  77  Resp: 13 16  Temp:  98.7 F (37.1 C)  SpO2: 95% 96%   Vitals:   07/02/19 0434 07/02/19 0545 07/02/19 0802 07/02/19 1019  BP:  123/77  124/83  Pulse:  74  77  Resp: 13 14 13 16   Temp:  97.9 F (36.6 C)  98.7 F (37.1 C)  TempSrc:  Oral  Oral  SpO2: 97% 96% 95% 96%  Weight:      Height:        General appearance : Awake, alert, not in any distress. Speech Clear. Not toxic looking HEENT: Atraumatic and Normocephalic, pupils equally reactive to light and accomodation Neck: Supple, no JVD. No cervical lymphadenopathy.  Chest: Good air entry bilaterally, no added sounds  CVS: S1 S2 regular, no murmurs.  Abdomen: Bowel sounds present, Non tender and not distended with no gaurding, rigidity or rebound. Extremities: B/L Lower Ext shows no edema, both legs are warm to touch Neurology: Awake alert, and oriented X 3, CN II-XII intact, Non focal Skin: No Rash  Discharge Instructions   Allergies as of 07/02/2019      Reactions   Cefaclor Hives, Swelling   Hydroxyurea Other (See Comments), Palpitations   Lower blood levels and HR Other reaction(s): Hypotension, Other (See Comments) "it messes me up, it drops my levels and stuff" Lower blood levels and HR "it messes me up, it drops my levels and stuff"      Medication List    STOP taking these medications   HYDROmorphone 4 MG tablet Commonly known as: DILAUDID     TAKE these medications   albuterol 108 (90 Base) MCG/ACT inhaler Commonly known as: VENTOLIN HFA Inhale 2 puffs into the lungs 2 (two) times daily as needed for wheezing or shortness of breath.   Deferasirox 360 MG Tabs Take 1,080 mg by mouth daily.   diphenhydrAMINE 25 mg capsule Commonly known as: BENADRYL Take 25 mg by mouth 3 (three) times daily as needed for itching.   folic acid 1 MG tablet Commonly known as: FOLVITE Take 1 mg by mouth daily.   mirtazapine 45 MG tablet Commonly known as:  REMERON Take 45 mg by mouth at bedtime.   mometasone-formoterol 100-5 MCG/ACT Aero Commonly known as: DULERA Inhale 2 puffs into the lungs daily as needed for wheezing or shortness of breath.   omeprazole 20 MG capsule Commonly known as: PRILOSEC Take 20 mg by mouth 2 (two) times a day.   Oxbryta 500 MG Tabs tablet Generic drug: voxelotor Take 1,500 mg by mouth daily.   Oxycodone HCl 10 MG Tabs Take 1 tablet (10 mg total) by mouth every 4 (four) hours while awake.   oxyCODONE 10 mg 12 hr tablet Commonly known as: OXYCONTIN Take 1 tablet (10 mg total) by mouth every 12 (twelve) hours.   promethazine 25 MG tablet Commonly known as: PHENERGAN Take 25 mg by mouth every 8 (eight) hours as needed for nausea/vomiting.   vitamin B-12 1000 MCG tablet Commonly known as: CYANOCOBALAMIN Take 1,000 mcg by mouth daily.  Vitamin D3 25 MCG (1000 UT) Caps Take 1,000 Units by mouth daily.   Xarelto 20 MG Tabs tablet Generic drug: rivaroxaban Take 1 tablet (20 mg total) by mouth daily.       The results of significant diagnostics from this hospitalization (including imaging, microbiology, ancillary and laboratory) are listed below for reference.    Significant Diagnostic Studies: DG Chest Port 1 View  Result Date: 06/26/2019 CLINICAL DATA:  39 year old female with sickle cell anemia and chest pain. EXAM: PORTABLE CHEST 1 VIEW COMPARISON:  Chest radiograph dated 06/23/2019. FINDINGS: Similar positioning of the bilateral port. The lungs are clear. There is no pleural effusion or pneumothorax. Mild cardiomegaly. No acute osseous pathology. IMPRESSION: 1. No acute cardiopulmonary process. 2. Mild cardiomegaly. Electronically Signed   By: Anner Crete M.D.   On: 06/26/2019 22:56   DG Chest Port 1 View  Result Date: 06/23/2019 CLINICAL DATA:  Sickle cell pain crisis EXAM: PORTABLE CHEST 1 VIEW COMPARISON:  06/08/2019 FINDINGS: Chronic interstitial coarsening. There is no edema,  consolidation, effusion, or pneumothorax. Port from bilateral approach with tips in good position about the upper cavoatrial junction. Normal heart size and mediastinal contours. IMPRESSION: No evidence of active disease. Electronically Signed   By: Monte Fantasia M.D.   On: 06/23/2019 05:37   DG Chest Portable 1 View  Result Date: 06/08/2019 CLINICAL DATA:  Upper back pain and cough, history of sickle cell in the EXAM: PORTABLE CHEST 1 VIEW COMPARISON:  Radiograph 05/04/2019, CT 04/19/2019 FINDINGS: Bilateral left and right IJ approach Port-A-Cath remain in stable position comparison exam. No consolidation, features of edema, pneumothorax, or effusion. The cardiomediastinal contours are unremarkable. No acute osseous or soft tissue abnormality. Cholecystectomy clips in the right upper quadrant. IMPRESSION: No acute cardiopulmonary abnormality. Electronically Signed   By: Lovena Le M.D.   On: 06/08/2019 22:12    Microbiology: Recent Results (from the past 240 hour(s))  SARS CORONAVIRUS 2 (TAT 6-24 HRS) Nasopharyngeal Nasopharyngeal Swab     Status: None   Collection Time: 06/27/19  4:33 AM   Specimen: Nasopharyngeal Swab  Result Value Ref Range Status   SARS Coronavirus 2 NEGATIVE NEGATIVE Final    Comment: (NOTE) SARS-CoV-2 target nucleic acids are NOT DETECTED. The SARS-CoV-2 RNA is generally detectable in upper and lower respiratory specimens during the acute phase of infection. Negative results do not preclude SARS-CoV-2 infection, do not rule out co-infections with other pathogens, and should not be used as the sole basis for treatment or other patient management decisions. Negative results must be combined with clinical observations, patient history, and epidemiological information. The expected result is Negative. Fact Sheet for Patients: SugarRoll.be Fact Sheet for Healthcare Providers: https://www.woods-mathews.com/ This test is not  yet approved or cleared by the [place] FDA and  has been authorized for detection and/or diagnosis of SARS-CoV-2 by FDA under an Emergency Use Authorization (EUA). This EUA will remain  in effect (meaning this test can be used) for the duration of the COVID-19 declaration under Section 56 4(b)(1) of the Act, 21 U.S.C. section 360bbb-3(b)(1), unless the authorization is terminated or revoked sooner. Performed at [facility], [place] [address], [place], [place] [address]      Labs: Basic Metabolic Panel: Recent Labs  Lab 06/26/19 2229 07/01/19 0647  NA 136 137  K 3.8 4.0  CL 107 105  CO2 23 26  GLUCOSE 98 99  BUN 7 9  CREATININE 0.44 0.50  CALCIUM 9.0 8.2*   Liver Function Tests:  Recent Labs  Lab 06/26/19 2229  AST 36  ALT 20  ALKPHOS 91  BILITOT 3.4*  PROT 8.6*  ALBUMIN 4.6   Recent Labs  Lab 06/26/19 2304  LIPASE 31   No results for input(s): AMMONIA in the last 168 hours. CBC: Recent Labs  Lab 06/26/19 2229 06/28/19 0827 06/29/19 0620 07/01/19 0647  WBC 17.1* 18.1* 19.5* 13.7*  NEUTROABS 10.9*  --   --   --   HGB 7.5* 6.4* 7.7* 7.5*  HCT 22.2* 19.0* 23.1* 22.9*  MCV 85.1 87.2 88.2 88.8  PLT 655* 596* 586* 574*   Cardiac Enzymes: No results for input(s): CKTOTAL, CKMB, CKMBINDEX, TROPONINI in the last 168 hours. BNP: Invalid input(s): POCBNP CBG: No results for input(s): GLUCAP in the last 168 hours.  Time coordinating discharge: 50 minutes  Signed:  [name]  APRN, MSN, FNP-C Patient [place],  [id number] [phone]  [facility] 07/02/2019, 12:19 PM

## 2019-07-07 ENCOUNTER — Emergency Department (HOSPITAL_COMMUNITY): Payer: Medicare Other

## 2019-07-07 ENCOUNTER — Emergency Department (HOSPITAL_COMMUNITY)
Admission: EM | Admit: 2019-07-07 | Discharge: 2019-07-08 | Disposition: A | Discharge status: Home / Self Care | Payer: Medicare Other | Attending: Emergency Medicine | Admitting: Emergency Medicine

## 2019-07-07 ENCOUNTER — Other Ambulatory Visit: Payer: Self-pay

## 2019-07-07 ENCOUNTER — Encounter (HOSPITAL_COMMUNITY): Payer: Self-pay | Admitting: Emergency Medicine

## 2019-07-07 DIAGNOSIS — R0789 Other chest pain: Secondary | ICD-10-CM | POA: Diagnosis present

## 2019-07-07 DIAGNOSIS — K5903 Drug induced constipation: Secondary | ICD-10-CM | POA: Insufficient documentation

## 2019-07-07 DIAGNOSIS — Z79899 Other long term (current) drug therapy: Secondary | ICD-10-CM | POA: Diagnosis not present

## 2019-07-07 DIAGNOSIS — R000 Tachycardia, unspecified: Secondary | ICD-10-CM | POA: Insufficient documentation

## 2019-07-07 DIAGNOSIS — Z7901 Long term (current) use of anticoagulants: Secondary | ICD-10-CM | POA: Diagnosis not present

## 2019-07-07 DIAGNOSIS — J45909 Unspecified asthma, uncomplicated: Secondary | ICD-10-CM | POA: Diagnosis not present

## 2019-07-07 DIAGNOSIS — Z765 Malingerer [conscious simulation]: Secondary | ICD-10-CM | POA: Insufficient documentation

## 2019-07-07 DIAGNOSIS — D5700 Hb-SS disease with crisis, unspecified: Secondary | ICD-10-CM | POA: Diagnosis not present

## 2019-07-07 LAB — CBC WITH DIFFERENTIAL/PLATELET
Abs Immature Granulocytes: 0.1 10*3/uL — ABNORMAL HIGH (ref 0.00–0.07)
Basophils Absolute: 0.1 10*3/uL (ref 0.0–0.1)
Basophils Relative: 1 %
Eosinophils Absolute: 0.2 10*3/uL (ref 0.0–0.5)
Eosinophils Relative: 1 %
HCT: 25.2 % — ABNORMAL LOW (ref 36.0–46.0)
Hemoglobin: 8.4 g/dL — ABNORMAL LOW (ref 12.0–15.0)
Immature Granulocytes: 1 %
Lymphocytes Relative: 25 %
Lymphs Abs: 4.1 10*3/uL — ABNORMAL HIGH (ref 0.7–4.0)
MCH: 28.1 pg (ref 26.0–34.0)
MCHC: 33.3 g/dL (ref 30.0–36.0)
MCV: 84.3 fL (ref 80.0–100.0)
Monocytes Absolute: 2 10*3/uL — ABNORMAL HIGH (ref 0.1–1.0)
Monocytes Relative: 12 %
Neutro Abs: 9.8 10*3/uL — ABNORMAL HIGH (ref 1.7–7.7)
Neutrophils Relative %: 60 %
Platelets: 594 10*3/uL — ABNORMAL HIGH (ref 150–400)
RBC: 2.99 MIL/uL — ABNORMAL LOW (ref 3.87–5.11)
RDW: 18.6 % — ABNORMAL HIGH (ref 11.5–15.5)
WBC: 16.2 10*3/uL — ABNORMAL HIGH (ref 4.0–10.5)
nRBC: 0.3 % — ABNORMAL HIGH (ref 0.0–0.2)

## 2019-07-07 LAB — COMPREHENSIVE METABOLIC PANEL
ALT: 22 U/L (ref 0–44)
AST: 38 U/L (ref 15–41)
Albumin: 4.7 g/dL (ref 3.5–5.0)
Alkaline Phosphatase: 68 U/L (ref 38–126)
Anion gap: 11 (ref 5–15)
BUN: 12 mg/dL (ref 6–20)
CO2: 18 mmol/L — ABNORMAL LOW (ref 22–32)
Calcium: 9.4 mg/dL (ref 8.9–10.3)
Chloride: 106 mmol/L (ref 98–111)
Creatinine, Ser: 0.56 mg/dL (ref 0.44–1.00)
GFR calc Af Amer: >60 mL/min (ref >60)
GFR calc non Af Amer: >60 mL/min (ref >60)
Glucose, Bld: 94 mg/dL (ref 70–99)
Potassium: 4.5 mmol/L (ref 3.5–5.1)
Sodium: 135 mmol/L (ref 135–145)
Total Bilirubin: 3.6 mg/dL — ABNORMAL HIGH (ref 0.3–1.2)
Total Protein: 8.7 g/dL — ABNORMAL HIGH (ref 6.5–8.1)

## 2019-07-07 LAB — RETICULOCYTES
Immature Retic Fract: 16.6 % — ABNORMAL HIGH (ref 2.3–15.9)
RBC.: 2.92 MIL/uL — ABNORMAL LOW (ref 3.87–5.11)
Retic Count, Absolute: 185.1 10*3/uL (ref 19.0–186.0)
Retic Ct Pct: 6.3 % — ABNORMAL HIGH (ref 0.4–3.1)

## 2019-07-07 LAB — TROPONIN I (HIGH SENSITIVITY)
Troponin I (High Sensitivity): 3 ng/L (ref <18)
Troponin I (High Sensitivity): 3 ng/L (ref <18)

## 2019-07-07 LAB — I-STAT BETA HCG BLOOD, ED (MC, WL, AP ONLY): I-stat hCG, quantitative: <5 m[IU]/mL (ref <5)

## 2019-07-07 MED ORDER — HYDROMORPHONE HCL 1 MG/ML IJ SOLN
2.0000 mg | INTRAMUSCULAR | Status: AC
  Administered 2019-07-07: 2 mg via INTRAVENOUS
  Filled 2019-07-07 (×2): qty 2

## 2019-07-07 MED ORDER — DIPHENHYDRAMINE HCL 50 MG/ML IJ SOLN
25.0000 mg | Freq: Once | INTRAMUSCULAR | Status: AC
  Administered 2019-07-07: 25 mg via INTRAVENOUS
  Filled 2019-07-07: qty 1

## 2019-07-07 MED ORDER — KETOROLAC TROMETHAMINE 15 MG/ML IJ SOLN
15.0000 mg | INTRAMUSCULAR | Status: AC
  Administered 2019-07-07: 15 mg via INTRAVENOUS
  Filled 2019-07-07: qty 1

## 2019-07-07 MED ORDER — IOHEXOL 350 MG/ML SOLN
75.0000 mL | Freq: Once | INTRAVENOUS | Status: AC | PRN
  Administered 2019-07-07: 75 mL via INTRAVENOUS

## 2019-07-07 MED ORDER — SODIUM CHLORIDE 0.9% FLUSH
10.0000 mL | Freq: Two times a day (BID) | INTRAVENOUS | Status: DC
  Administered 2019-07-07: 20 mL

## 2019-07-07 MED ORDER — MORPHINE SULFATE 15 MG PO TABS
30.0000 mg | ORAL_TABLET | Freq: Once | ORAL | Status: AC
  Administered 2019-07-07: 30 mg via ORAL
  Filled 2019-07-07: qty 2

## 2019-07-07 MED ORDER — SODIUM CHLORIDE 0.45 % IV SOLN: INTRAVENOUS | Status: DC

## 2019-07-07 MED ORDER — CHLORHEXIDINE GLUCONATE CLOTH 2 % EX PADS: 6.0000 | MEDICATED_PAD | Freq: Every day | CUTANEOUS | Status: DC

## 2019-07-07 MED ORDER — DIPHENHYDRAMINE HCL 25 MG PO CAPS: 25.0000 mg | ORAL_CAPSULE | Freq: Four times a day (QID) | ORAL | Status: DC | PRN

## 2019-07-07 MED ORDER — HYDROMORPHONE HCL 1 MG/ML IJ SOLN
2.0000 mg | INTRAMUSCULAR | Status: AC
  Administered 2019-07-07: 2 mg via INTRAVENOUS

## 2019-07-07 MED ORDER — PROMETHAZINE HCL 25 MG PO TABS: 25.0000 mg | ORAL_TABLET | ORAL | Status: DC | PRN

## 2019-07-07 MED ORDER — SODIUM CHLORIDE 0.9% FLUSH: 3.0000 mL | Freq: Once | INTRAVENOUS | Status: DC

## 2019-07-07 MED ORDER — SODIUM CHLORIDE 0.9% FLUSH: 10.0000 mL | INTRAVENOUS | Status: DC | PRN

## 2019-07-07 NOTE — Discharge Instructions (Signed)
Please follow up with your primary care provider tomorrow as scheduled for re-evaluation of your symptoms. If you do not have a primary care provider, information for a healthcare clinic has been provided for you to make arrangements for follow up care. Please return to the emergency department for any new or worsening symptoms.

## 2019-07-07 NOTE — ED Triage Notes (Signed)
Pt reports sickle cell crisis since yesterday with throbbing pain to center of chest and SOB.

## 2019-07-07 NOTE — ED Notes (Signed)
This RN contacted CT to confirm Scan; informed that she will be having scan soon.

## 2019-07-07 NOTE — ED Notes (Signed)
IV team at bedside 

## 2019-07-07 NOTE — ED Provider Notes (Signed)
[facility] EMERGENCY DEPARTMENT Provider Note   CSN: [id number] Arrival date & time: 07/07/19  1432     History Chief Complaint  Patient presents with  . Chest Pain  . Sickle Cell Pain Crisis    Molly Gutierrez is a 39 y.o. female.  HPI   Patient is a 39 year old female with a history of asthma, eczema, history of PE on Xarelto, sickle cell anemia, history of acute chest syndrome, who presents the emergency department today for evaluation of chest pain sickle cell crisis.  States pain is located to the center and all over of her chest. Pain started yesterday and has been constant in nature. Rates pain 9/10. Denies exacerbating or alleviating factors. Pain is not worse with exertion or breathing. Pain feels like a dull, throbbing, achy type pain. Sxs today feel typical of her sickle cell pain. Denies that sxs do not feel similar to when she had had her PE or had acute chest syndrome. States she missed a dose of her xarelto yesterday. Denies fevers, cough, hemoptysis, or shortness of breath.   Pt was admitted to the hospitalist last week for sickle cell crisis. Is not on birth control and has had no recent surgeries.   At home, she had tried oxycodone without relief.   Past Medical History:  Diagnosis Date  . Asthma   . Eczema   . History of pulmonary embolus (PE)   . Sickle cell anemia [facility])     Patient Active Problem List   Diagnosis Date Noted  . Atelectasis of right lung 07/07/2019  . Drug-seeking behavior 07/07/2019  . Sinus tachycardia 07/07/2019  . Therapeutic opioid-induced constipation (OIC) 07/07/2019  . Sickle cell crisis ([place]) 06/27/2019  . Breast mass in female   . Sickle cell pain crisis ([place]) 04/19/2019  . Pneumonia 03/27/2019  . 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 Anterior chest wall pain 11/13/2018  . Epigastric abdominal pain 11/13/2018  . Hematuria 11/13/2018  . Hyperbilirubinemia 11/12/2018  . Long-term (current) use of  anticoagulants, INR goal 2.0-3.0 11/12/2018  . History of pulmonary embolism 09/26/2018  . Acute pulmonary embolism ([place]) 09/26/2018  . Opioid dependence ([place]) 09/13/2018  . Port-A-Cath in place 09/13/2018  . Narcotic abuse, continuous ([place]) 08/28/2018  . Moderate persistent asthma without complication [id number]  . Normocytic anemia 07/03/2018  . Personal history of other venous thrombosis and embolism 07/03/2018  . History of transfusion 07/03/2018  . Gastro-esophageal reflux disease without esophagitis 06/02/2018  . Asthma 05/19/2018  . Anxiety and depression 10/30/2017  . At risk for sepsis 06/13/2017  . Mitral regurgitation 02/01/2014  . Itching 12/13/2013  . Nausea & vomiting 12/13/2013  . Iron overload 11/30/2013  . Frequent complaints of pain 11/01/2013  . Hypokalemia 09/19/2013  . S/P total hip arthroplasty 05/19/2013  . 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 Lower urinary tract infectious disease 03/02/2013  . Chest pain 11/14/2012  . Sickle-cell disease without crisis ([place]) 10/30/2012  . Pain management 08/01/2012  . Reticulocytosis 07/31/2012  . 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 Leukocytosis 03/27/2012  . History of gestational diabetes 10/07/2011  . 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 CHOLECYSTECTOMY    . ERCP    . JOINT REPLACEMENT    . PORTA CATH INSERTION    . TUBAL LIGATION    . WISDOM TOOTH EXTRACTION  OB History   No obstetric history on file.     Family History  Problem Relation Age of Onset  . Renal Disease Mother   . Hypertension Mother   . High Cholesterol Mother     Social History   Tobacco Use  . Smoking status: Never Smoker  . Smokeless tobacco: Never Used  Substance Use Topics  . Alcohol use: Never  . Drug use:  Never    Home Medications Prior to Admission medications   Medication Sig Start Date End Date Taking? Authorizing Provider  albuterol (VENTOLIN HFA) 108 (90 Base) MCG/ACT inhaler Inhale 2 puffs into the lungs 2 (two) times daily as needed for wheezing or shortness of breath. 12/31/16   [provider]  Cholecalciferol (VITAMIN D3) 25 MCG (1000 UT) CAPS Take 1,000 Units by mouth daily.    [provider]  Deferasirox 360 MG TABS Take 1,080 mg by mouth daily. 09/27/16   [provider]  diphenhydrAMINE (BENADRYL) 25 mg capsule Take 25 mg by mouth 3 (three) times daily as needed for itching.    [provider]  folic acid (FOLVITE) 1 MG tablet Take 1 mg by mouth daily. 12/31/16   [provider]  mirtazapine (REMERON) 45 MG tablet Take 45 mg by mouth at bedtime. 09/30/18   [provider]  mometasone-formoterol (DULERA) 100-5 MCG/ACT AERO Inhale 2 puffs into the lungs daily as needed for wheezing or shortness of breath.    [provider]  omeprazole (PRILOSEC) 20 MG capsule Take 20 mg by mouth 2 (two) times a day. 09/08/18   [provider]  oxyCODONE (OXYCONTIN) 10 mg 12 hr tablet Take 1 tablet (10 mg total) by mouth every 12 (twelve) hours. 07/02/19   [name], FNP  oxyCODONE 10 MG TABS Take 1 tablet (10 mg total) by mouth every 4 (four) hours while awake. 07/02/19   [name], FNP  promethazine (PHENERGAN) 25 MG tablet Take 25 mg by mouth every 8 (eight) hours as needed for nausea/vomiting. 06/22/19   [provider]  vitamin B-12 (CYANOCOBALAMIN) 1000 MCG tablet Take 1,000 mcg by mouth daily.    [provider]  voxelotor (OXBRYTA) 500 MG TABS tablet Take 1,500 mg by mouth daily.    [provider]  XARELTO 20 MG TABS tablet Take 1 tablet (20 mg total) by mouth daily. 05/14/19   [name], MD    Allergies    Cefaclor and Hydroxyurea  Review of Systems   Review of Systems    Constitutional: Negative for chills and fever.  HENT: Negative for ear pain and sore throat.   Eyes: Negative for pain and visual disturbance.  Respiratory: Negative for cough and shortness of breath.   Cardiovascular: Positive for chest pain. Negative for leg swelling.  Gastrointestinal: Negative for abdominal pain, constipation, diarrhea, nausea and vomiting.  Genitourinary: Negative for dysuria and hematuria.  Musculoskeletal: Negative for back pain.  Skin: Negative for rash.  Neurological: Negative for headaches.  All other systems reviewed and are negative.   Physical Exam Updated Vital Signs BP 105/77   Pulse 85   Temp 99.1 F (37.3 C) (Oral)   Resp 15   LMP 06/30/2019   SpO2 98% Comment: room air  Physical Exam Vitals and nursing note reviewed.  Constitutional:      General: She is not in acute distress.    Appearance: She is well-developed. She is not ill-appearing or toxic-appearing.  HENT:  Head: Normocephalic and atraumatic.  Eyes:     Conjunctiva/sclera: Conjunctivae normal.  Cardiovascular:     Rate and Rhythm: Normal rate and regular rhythm.     Heart sounds: Normal heart sounds. No murmur.  Pulmonary:     Effort: Pulmonary effort is normal. No respiratory distress.     Breath sounds: Normal breath sounds. No decreased breath sounds, wheezing, rhonchi or rales.  Chest:     Chest wall: Tenderness (reproduces pain) present. No crepitus.  Abdominal:     Palpations: Abdomen is soft.     Tenderness: There is no abdominal tenderness.  Musculoskeletal:     Cervical back: Neck supple.  Skin:    General: Skin is warm and dry.  Neurological:     Mental Status: She is alert and oriented to person, place, and time.     ED Results / Procedures / Treatments   Labs (all labs ordered are listed, but only abnormal results are displayed) Labs Reviewed  COMPREHENSIVE METABOLIC PANEL - Abnormal; Notable for the following components:      Result Value   CO2 18  (*)    Total Protein 8.7 (*)    Total Bilirubin 3.6 (*)    All other components within normal limits  CBC WITH DIFFERENTIAL/PLATELET - Abnormal; Notable for the following components:   WBC 16.2 (*)    RBC 2.99 (*)    Hemoglobin 8.4 (*)    HCT 25.2 (*)    RDW 18.6 (*)    Platelets 594 (*)    nRBC 0.3 (*)    Neutro Abs 9.8 (*)    Lymphs Abs 4.1 (*)    Monocytes Absolute 2.0 (*)    Abs Immature Granulocytes 0.10 (*)    All other components within normal limits  RETICULOCYTES - Abnormal; Notable for the following components:   Retic Ct Pct 6.3 (*)    RBC. 2.92 (*)    Immature Retic Fract 16.6 (*)    All other components within normal limits  I-STAT BETA HCG BLOOD, ED ([facility], [facility], [facility] ONLY)  I-STAT BETA HCG BLOOD, ED ([facility], [facility], [facility] ONLY)  TROPONIN I (HIGH SENSITIVITY)  TROPONIN I (HIGH SENSITIVITY)    EKG EKG Interpretation  Date/Time:  Wednesday July 07 2019 15:16:05 EST Ventricular Rate:  109 PR Interval:  126 QRS Duration: 76 QT Interval:  332 QTC Calculation: 447 R Axis:   55 Text Interpretation: Sinus tachycardia Right atrial enlargement Nonspecific T wave abnormality Abnormal ECG Confirmed by Virgel Manifold [phone]) on 07/07/2019 5:58:56 PM   Radiology DG Chest 2 View  Result Date: 07/07/2019 CLINICAL DATA:  Shortness of breath. Sickle cell crisis since yesterday. EXAM: CHEST - 2 VIEW COMPARISON:  06/26/2019 FINDINGS: Bilateral IJ central venous catheters are unchanged. Lungs are adequately inflated with subtle prominence of the central pulmonary vessels likely minimal vascular congestion. No lobar consolidation or effusion. No pneumothorax. Mild stable cardiomegaly. Remainder of the exam is unchanged. IMPRESSION: Mild stable cardiomegaly with suggestion of minimal vascular congestion. Electronically Signed   By: Marin Olp M.D.   On: 07/07/2019 16:01   CT Angio Chest PE W and/or Wo Contrast  Result Date: 07/07/2019 CLINICAL DATA:  Shortness of breath and chest pain.  EXAM: CT ANGIOGRAPHY CHEST WITH CONTRAST TECHNIQUE: Multidetector CT imaging of the chest was performed using the standard protocol during bolus administration of intravenous contrast. Multiplanar CT image reconstructions and MIPs were obtained to evaluate the vascular anatomy. CONTRAST:  29mL OMNIPAQUE IOHEXOL 350 MG/ML SOLN COMPARISON:  April 19, 2019 FINDINGS: Cardiovascular: Bilateral venous Port-A-Cath so seen. Satisfactory opacification of the pulmonary arteries to the segmental level. No evidence of pulmonary embolism. Normal heart size. No pericardial effusion. Mediastinum/Nodes: No enlarged mediastinal, hilar, or axillary lymph nodes. Thyroid gland, trachea, and esophagus demonstrate no significant findings. Lungs/Pleura: Mild atelectasis is seen within the posterior aspect of the bilateral lung bases. There is no evidence of a pleural effusion or pneumothorax. Upper Abdomen: Multiple surgical clips are seen within the gallbladder fossa. The spleen is small and heterogeneous in appearance. Musculoskeletal: A predominant stable 3.5 cm x 2.1 cm soft tissue mass is seen adjacent to the T9 vertebral body on the right. A stable 2.4 cm x 1.9 cm partially calcified soft tissue mass is seen within the right breast (axial CT image 61, CT series number 6). The osseous structures are diffusely moderate in appearance. Review of the MIP images confirms the above findings. IMPRESSION: 1. No evidence of pulmonary embolism. 2. Stable appearing soft tissue mass adjacent to the T9 vertebral body on the right. 3. Stable 2.4 cm x 1.9 cm partially calcified soft tissue mass within the right breast. 4. Moderate appearing osseous structures consistent with patient's known history of sickle cell disease. Electronically Signed   By: Virgina Norfolk M.D.   On: 07/07/2019 22:47    Procedures Procedures (including critical care time)  Medications Ordered in ED Medications  sodium chloride flush (NS) 0.9 % injection 3 mL (3  mLs Intravenous Not Given 07/07/19 1828)  0.45 % sodium chloride infusion ( Intravenous New Bag/Given 07/07/19 2017)  promethazine (PHENERGAN) tablet 25 mg (has no administration in time range)  sodium chloride flush (NS) 0.9 % injection 10-40 mL (20 mLs Intracatheter Given 07/07/19 2102)  sodium chloride flush (NS) 0.9 % injection 10-40 mL (has no administration in time range)  Chlorhexidine Gluconate Cloth 2 % PADS 6 each (0 each Topical Hold 07/07/19 2058)  ketorolac (TORADOL) 15 MG/ML injection 15 mg (15 mg Intravenous Given 07/07/19 1934)  HYDROmorphone (DILAUDID) injection 2 mg (2 mg Intravenous Given 07/07/19 2056)  HYDROmorphone (DILAUDID) injection 2 mg (2 mg Intravenous Given 07/07/19 1934)  diphenhydrAMINE (BENADRYL) injection 25 mg (25 mg Intravenous Given 07/07/19 2055)  morphine (MSIR) tablet 30 mg (30 mg Oral Given 07/07/19 2249)  iohexol (OMNIPAQUE) 350 MG/ML injection 75 mL (75 mLs Intravenous Contrast Given 07/07/19 2211)  diphenhydrAMINE (BENADRYL) injection 25 mg (25 mg Intravenous Given 07/07/19 2325)    ED Course  I have reviewed the triage vital signs and the nursing notes.  Pertinent labs & imaging results that were available during my care of the patient were reviewed by me and considered in my medical decision making (see chart for details).    MDM Rules/Calculators/A&P                      39 year old female with history of sickle cell anemia presenting for evaluation of sickle cell crisis.  States she has pain to her chest that started yesterday.  It feels similar to prior sickle cell crisis pain.  She has history of PE and acute chest syndrome however she states that this pain does not feel similar to that.  On evaluation patient is afebrile with reassuring vital signs.  Her lungs are clear to auscultation bilaterally.  Heart with regular rate and rhythm.  She does have reproducible chest wall tenderness on exam.  Reviewed labs CBC with leukocytosis 16,000 which appears  chronic, also with hemoglobin of 8.4 which  is improved from prior does not require transfusion CMP with bicarb of 18, otherwise reassuring Reticulocytes with normal absolute reticulocyte count, elevated immature reticulocyte fraction Beta hcg neg Trop neg x2  EKG with sinus tachycardia, right atrial enlargement nonspecific T wave abnormality,  CXR  Mild stable cardiomegaly with suggestion of minimal vascular congestion  - Personally reviewed. Appears generally consistent with prior. No infiltrates seen to suggest acute chest syndrome. CXR also personally reviewed by supervising physician, Dr. Wilson Singer who does not feel that CXR is consistent with acute chest  CTA with no evidence of pulmonary embolism. Stable appearing soft tissue mass adjacent to the T9 vertebral body on the right. Stable 2.4 cm x 1.9 cm partially calcified soft tissue mass within the right breast. Moderate appearing osseous structures consistent with patient's known history of sickle cell disease.  On reassessment patient states that she is not feeling completely better but she does not want to be admitted to the hospital.  She will take her home medications.  She states she has follow-up with her doctor tomorrow.  I advised that if she has any worsening symptoms in the meantime she should return to the emergency department immediately.  She voices understanding and is in agreement plan.  All questions reviewed patient stable for discharge.   Final Clinical Impression(s) / ED Diagnoses Final diagnoses:  Sickle cell crisis [facility])    Rx / DC Orders ED Discharge Orders    None       Bishop Dublin 07/07/19 2343    Virgel Manifold, MD 07/08/19 1506

## 2019-07-07 NOTE — ED Notes (Signed)
Pt transported to CT ?

## 2019-07-08 DIAGNOSIS — D5700 Hb-SS disease with crisis, unspecified: Secondary | ICD-10-CM | POA: Diagnosis not present

## 2019-07-08 MED ORDER — HEPARIN SOD (PORK) LOCK FLUSH 100 UNIT/ML IV SOLN
500.0000 [IU] | INTRAVENOUS | Status: AC | PRN
  Administered 2019-07-08: 500 [IU]
  Filled 2019-07-08: qty 5

## 2019-07-08 NOTE — ED Notes (Signed)
Patient verbalizes understanding of discharge instructions. Opportunity for questioning and answers were provided. Armband removed by staff, pt discharged from ED ambulatory to home.  

## 2019-07-09 ENCOUNTER — Ambulatory Visit (INDEPENDENT_AMBULATORY_CARE_PROVIDER_SITE_OTHER): Payer: Medicare Other | Admitting: Nurse Practitioner

## 2019-07-09 ENCOUNTER — Other Ambulatory Visit: Payer: Self-pay

## 2019-07-09 ENCOUNTER — Encounter: Payer: Self-pay | Admitting: Nurse Practitioner

## 2019-07-09 VITALS — BP 103/66 | HR 102 | Temp 99.4°F | Resp 16 | Ht 63.0 in | Wt 115.0 lb

## 2019-07-09 DIAGNOSIS — J4520 Mild intermittent asthma, uncomplicated: Secondary | ICD-10-CM

## 2019-07-09 DIAGNOSIS — Z0000 Encounter for general adult medical examination without abnormal findings: Secondary | ICD-10-CM | POA: Diagnosis not present

## 2019-07-09 DIAGNOSIS — N63 Unspecified lump in unspecified breast: Secondary | ICD-10-CM

## 2019-07-09 DIAGNOSIS — Z23 Encounter for immunization: Secondary | ICD-10-CM

## 2019-07-09 DIAGNOSIS — D5700 Hb-SS disease with crisis, unspecified: Secondary | ICD-10-CM

## 2019-07-09 DIAGNOSIS — Z86711 Personal history of pulmonary embolism: Secondary | ICD-10-CM | POA: Diagnosis not present

## 2019-07-09 LAB — POCT URINALYSIS DIPSTICK
Glucose, UA: NEGATIVE
Leukocytes, UA: NEGATIVE
Nitrite, UA: NEGATIVE
Protein, UA: POSITIVE — AB
Spec Grav, UA: 1.01 (ref 1.010–1.025)
Urobilinogen, UA: 1 E.U./dL
pH, UA: 5.5 (ref 5.0–8.0)

## 2019-07-09 NOTE — Patient Instructions (Signed)
Lung Mass  A lung mass is a growth in the lung that is larger than 3 centimeters (1.2 inches). Smaller growths are called nodules. Most lung nodules are not cancer. Lung masses have a higher risk of being cancer. A lung mass is sometimes found during a routine chest X-ray or while doing other imaging tests to check for other problems. What are common types of lung masses? Lung masses include:  Tumors. These may be cancerous (malignant) or noncancerous (benign).  Infectious masses (granulomas). These are masses caused by inflammation from bacterial infections, like tuberculosis, or fungal infections.  Noninfectious masses. Some diseases that cause lung inflammation may also cause lung masses to form.  Blood vessel malformations. What type of testing may be needed? Your health care provider may recommend that you have tests to diagnose the cause of your lung mass. The following tests may be done if a lung mass is found:  Physical exam.  Imaging tests, such as: ? Chest X-rays. ? CT scan. ? PET scan. This scan measures how much energy a mass is using.  Biopsy to rule out cancer or confirm a diagnosis. This procedure involves removing a tissue sample from the mass with a needle inserted through the chest, using a scope placed down into the lung, or through open surgery. Tests and physical exams may be done once, or they may be done regularly for a period of time. Tests and exams that are done regularly will help monitor whether the mass or tissue change is growing and becoming a concern. What are common treatments? Treatment for a lung mass depends on the cause. Noncancerous masses may require treatment specific to the cause. Treatment options for a cancerous lung mass may include:  Surgical removal.  Radiation therapy.  Chemotherapy. Follow these instructions at home: If you have had surgery or a biopsy, your health care provider will give you specific instructions for taking care of  yourself at home after your procedure. Follow these instructions carefully. General home care instructions include:  Take over-the-counter and prescription medicines only as told by your health care provider.  Return to your normal activities as told by your health care provider. Ask your health care provider what activities are safe for you.  Do not use any products that contain nicotine or tobacco, such as cigarettes, e-cigarettes, and chewing tobacco. If you need help quitting, ask your health care provider.  Keep all follow-up visits as told by your health care provider. This is important. Contact a health care provider if you:  Have pain in your chest, back, or shoulder.  Are short of breath.  Have a cough.  Cough up blood or bloody sputum. Summary  A lung mass is a growth in the lung that is larger than 3 centimeters (1.2 inches).  Lung masses have a higher risk of being cancer than do smaller growths.  Sometimes a lung mass is found during a routine chest X-ray or other imaging test.  Your health care provider may do a biopsy of the lung mass to rule out or confirm cancer.  Treatment for this condition depends on the cause. This information is not intended to replace advice given to you by your health care provider. Make sure you discuss any questions you have with your health care provider. Document Revised: 09/25/2018 Document Reviewed: 01/27/2018 Elsevier Patient Education  2020 [employer]  

## 2019-07-09 NOTE — Progress Notes (Signed)
New Patient Office Visit  Subjective:  Patient ID: Molly Gutierrez, female    DOB: 1981-01-16  Age: 39 y.o. MRN: [id number]  CC:  Chief Complaint  Patient presents with  . Establish Care  . Sickle Cell Anemia    HPI [facility] presents for establish care. She has seen Kandee Keen FNP in [place].  She has moved to the area.  She has sickle cell disease, asthma and pulmonary embolism x2; 2009 and 2020.  She is currently treated on Xarelto.  She denies any abnormal bleeding.  She has concerns today about the mass on her lung. She was seen pulmonologist in the past.  She has not followed up. She has gets "winded" when she does too much. She feels like this is more that unsusal. She is wanted to have this evaluated. She has asthma.  She is a non-smoker.  She does have family history; uncle lung cancer; nonsmoker.   She admits that she has chest pain with her Sickle Cell anemia.  She denies any chest pain, shortness of breath or discomfort at this time.  She denies abdominal pain, constipation or diarrhea.  She denies any leg or back pain.   Past Medical History:  Diagnosis Date  . Asthma   . Eczema   . History of pulmonary embolus (PE)   . Sickle cell anemia (HCC)     Past Surgical History:  Procedure Laterality Date  . CHOLECYSTECTOMY    . ERCP    . JOINT REPLACEMENT    . PORTA CATH INSERTION    . TUBAL LIGATION    . WISDOM TOOTH EXTRACTION      Family History  Problem Relation Age of Onset  . Renal Disease Mother   . Hypertension Mother   . High Cholesterol Mother     Social History   Socioeconomic History  . Marital status: Single    Spouse name: Not on file  . Number of children: Not on file  . Years of education: Not on file  . Highest education level: Not on file  Occupational History  . Not on file  Tobacco Use  . Smoking status: Never Smoker  . Smokeless tobacco: Never Used  Substance and Sexual Activity  . Alcohol use: Never  . Drug  use: Never  . Sexual activity: Not on file  Other Topics Concern  . Not on file  Social History Narrative  . Not on file   Social Determinants of Health   Financial Resource Strain:   . Difficulty of Paying Living Expenses: Not on file  Food Insecurity:   . Worried About Charity fundraiser in the Last Year: Not on file  . Ran Out of Food in the Last Year: Not on file  Transportation Needs:   . Lack of Transportation (Medical): Not on file  . Lack of Transportation (Non-Medical): Not on file  Physical Activity:   . Days of Exercise per Week: Not on file  . Minutes of Exercise per Session: Not on file  Stress:   . Feeling of Stress : Not on file  Social Connections:   . Frequency of Communication with Friends and Family: Not on file  . Frequency of Social Gatherings with Friends and Family: Not on file  . Attends Religious Services: Not on file  . Active Member of Clubs or Organizations: Not on file  . Attends Archivist Meetings: Not on file  . Marital Status: Not on file  Intimate Partner Violence:   .  Fear of Current or Ex-Partner: Not on file  . Emotionally Abused: Not on file  . Physically Abused: Not on file  . Sexually Abused: Not on file    ROS Review of Systems  Constitutional: Negative.   HENT: Negative.   Eyes: Negative.   Respiratory: Negative.   Cardiovascular: Negative.   Gastrointestinal: Negative.   Endocrine: Negative.   Genitourinary: Negative.   Musculoskeletal: Negative.   Skin: Negative.   Allergic/Immunologic: Negative.   Neurological: Negative.   Hematological: Negative.   Psychiatric/Behavioral: Negative.     Objective:   Today's Vitals: BP 103/66 (BP Location: Right Arm, Patient Position: Sitting, Cuff Size: Normal)   Pulse (!) 102   Temp 99.4 F (37.4 C) (Oral)   Resp 16   Ht 5\' 3"  (1.6 m)   Wt 115 lb (52.2 kg)   LMP 06/30/2019   SpO2 100%   BMI 20.37 kg/m   Physical Exam Constitutional:      Comments: Some what  frail-look  HENT:     Head: Normocephalic.     Nose: Nose normal.     Mouth/Throat:     Mouth: Mucous membranes are moist.     Pharynx: Oropharynx is clear.  Cardiovascular:     Rate and Rhythm: Normal rate and regular rhythm.  Pulmonary:     Effort: Pulmonary effort is normal.     Breath sounds: Normal breath sounds.  Abdominal:     General: Abdomen is flat. Bowel sounds are normal.     Palpations: Abdomen is soft.  Musculoskeletal:        General: Normal range of motion.     Cervical back: Normal range of motion and neck supple.  Skin:    General: Skin is warm and dry.     Comments: 2 areas of swelling on the right hand underneath the vein  Neurological:     General: No focal deficit present.     Mental Status: She is alert and oriented to person, place, and time.  Psychiatric:        Mood and Affect: Mood normal.        Behavior: Behavior normal.        Thought Content: Thought content normal.        Judgment: Judgment normal.     Assessment & Plan:   Problem List Items Addressed This Visit      High   Asthma   Relevant Orders   Ambulatory referral to Pulmonology   History of pulmonary embolism   Relevant Orders   Ambulatory referral to Pulmonology   Sickle cell pain crisis ([place]) - Primary   Relevant Orders   Urinalysis Dipstick (Completed)     Medium   Breast mass in female   Relevant Orders   HM MAMMOGRAPHY (Completed)    Other Visit Diagnoses    Healthcare maintenance       Mammogram to evaluate history of breast mass   Relevant Orders   HM MAMMOGRAPHY (Completed)      Outpatient Encounter Medications as of 07/09/2019  Medication Sig  . albuterol (VENTOLIN HFA) 108 (90 Base) MCG/ACT inhaler Inhale 2 puffs into the lungs 2 (two) times daily as needed for wheezing or shortness of breath.  . Cholecalciferol (VITAMIN D3) 25 MCG (1000 UT) CAPS Take 1,000 Units by mouth daily.  . Deferasirox 360 MG TABS Take 1,080 mg by mouth daily.  . diphenhydrAMINE  (BENADRYL) 25 mg capsule Take 25 mg by mouth 3 (three) times daily as  needed for itching.  . folic acid (FOLVITE) 1 MG tablet Take 1 mg by mouth daily.  . mirtazapine (REMERON) 45 MG tablet Take 45 mg by mouth at bedtime.  . mometasone-formoterol (DULERA) 100-5 MCG/ACT AERO Inhale 2 puffs into the lungs daily as needed for wheezing or shortness of breath.  Marland Kitchen omeprazole (PRILOSEC) 20 MG capsule Take 20 mg by mouth 2 (two) times a day.  . oxyCODONE 10 MG TABS Take 1 tablet (10 mg total) by mouth every 4 (four) hours while awake.  . promethazine (PHENERGAN) 25 MG tablet Take 25 mg by mouth every 8 (eight) hours as needed for nausea/vomiting.  . vitamin B-12 (CYANOCOBALAMIN) 1000 MCG tablet Take 1,000 mcg by mouth daily.  Marland Kitchen voxelotor (OXBRYTA) 500 MG TABS tablet Take 1,500 mg by mouth daily.  Alveda Reasons 20 MG TABS tablet Take 1 tablet (20 mg total) by mouth daily.  Marland Kitchen oxyCODONE (OXYCONTIN) 10 mg 12 hr tablet Take 1 tablet (10 mg total) by mouth every 12 (twelve) hours. (Patient not taking: Reported on 07/09/2019)   No facility-administered encounter medications on file as of 07/09/2019.    Follow-up: Return in about 4 weeks (around 08/06/2019).   Vevelyn Francois, NP

## 2019-07-12 ENCOUNTER — Telehealth (HOSPITAL_COMMUNITY): Payer: Self-pay | Admitting: *Deleted

## 2019-07-12 NOTE — Telephone Encounter (Signed)
Patient called requesting to come to the day hospital for sickle cell pain. Patient reports chest pain rated 8/10, which is typical for her crisis. Reports last taking Oxycontin and Oxycodone last night. COVID-19 screening done and patient denies all symptoms. Denies fever, chest pain, vomiting, diarrhea and abdominal pain. Admits to having transportation at discharge without driving self. [place], [place] notified and advised patient to take pain medications around the clock as prescribed.  Patient can call back to the day hospital in the morning if pain is not managed. Patient advised and expresses an understanding.

## 2019-07-13 ENCOUNTER — Emergency Department (HOSPITAL_COMMUNITY): Payer: Medicare Other

## 2019-07-13 ENCOUNTER — Other Ambulatory Visit: Payer: Self-pay

## 2019-07-13 ENCOUNTER — Emergency Department (HOSPITAL_COMMUNITY)
Admission: EM | Admit: 2019-07-13 | Discharge: 2019-07-13 | Disposition: A | Discharge status: Home / Self Care | Payer: Medicare Other | Attending: Emergency Medicine | Admitting: Emergency Medicine

## 2019-07-13 ENCOUNTER — Encounter (HOSPITAL_COMMUNITY): Payer: Self-pay | Admitting: *Deleted

## 2019-07-13 DIAGNOSIS — Z7901 Long term (current) use of anticoagulants: Secondary | ICD-10-CM | POA: Insufficient documentation

## 2019-07-13 DIAGNOSIS — Z79899 Other long term (current) drug therapy: Secondary | ICD-10-CM | POA: Insufficient documentation

## 2019-07-13 DIAGNOSIS — D5700 Hb-SS disease with crisis, unspecified: Secondary | ICD-10-CM | POA: Diagnosis present

## 2019-07-13 DIAGNOSIS — J45909 Unspecified asthma, uncomplicated: Secondary | ICD-10-CM | POA: Insufficient documentation

## 2019-07-13 LAB — CBC WITH DIFFERENTIAL/PLATELET
Abs Immature Granulocytes: 0.16 10*3/uL — ABNORMAL HIGH (ref 0.00–0.07)
Basophils Absolute: 0.1 10*3/uL (ref 0.0–0.1)
Basophils Relative: 1 %
Eosinophils Absolute: 0.4 10*3/uL (ref 0.0–0.5)
Eosinophils Relative: 2 %
HCT: 25.1 % — ABNORMAL LOW (ref 36.0–46.0)
Hemoglobin: 8.4 g/dL — ABNORMAL LOW (ref 12.0–15.0)
Immature Granulocytes: 1 %
Lymphocytes Relative: 19 %
Lymphs Abs: 3.4 10*3/uL (ref 0.7–4.0)
MCH: 28.9 pg (ref 26.0–34.0)
MCHC: 33.5 g/dL (ref 30.0–36.0)
MCV: 86.3 fL (ref 80.0–100.0)
Monocytes Absolute: 2 10*3/uL — ABNORMAL HIGH (ref 0.1–1.0)
Monocytes Relative: 11 %
Neutro Abs: 12.3 10*3/uL — ABNORMAL HIGH (ref 1.7–7.7)
Neutrophils Relative %: 66 %
Platelets: 652 10*3/uL — ABNORMAL HIGH (ref 150–400)
RBC: 2.91 MIL/uL — ABNORMAL LOW (ref 3.87–5.11)
RDW: 19.7 % — ABNORMAL HIGH (ref 11.5–15.5)
WBC: 18.4 10*3/uL — ABNORMAL HIGH (ref 4.0–10.5)
nRBC: 0.4 % — ABNORMAL HIGH (ref 0.0–0.2)

## 2019-07-13 LAB — COMPREHENSIVE METABOLIC PANEL
ALT: 17 U/L (ref 0–44)
AST: 29 U/L (ref 15–41)
Albumin: 5 g/dL (ref 3.5–5.0)
Alkaline Phosphatase: 72 U/L (ref 38–126)
Anion gap: 8 (ref 5–15)
BUN: 7 mg/dL (ref 6–20)
CO2: 21 mmol/L — ABNORMAL LOW (ref 22–32)
Calcium: 9.3 mg/dL (ref 8.9–10.3)
Chloride: 108 mmol/L (ref 98–111)
Creatinine, Ser: 0.42 mg/dL — ABNORMAL LOW (ref 0.44–1.00)
GFR calc Af Amer: >60 mL/min (ref >60)
GFR calc non Af Amer: >60 mL/min (ref >60)
Glucose, Bld: 102 mg/dL — ABNORMAL HIGH (ref 70–99)
Potassium: 3.7 mmol/L (ref 3.5–5.1)
Sodium: 137 mmol/L (ref 135–145)
Total Bilirubin: 5.1 mg/dL — ABNORMAL HIGH (ref 0.3–1.2)
Total Protein: 9.4 g/dL — ABNORMAL HIGH (ref 6.5–8.1)

## 2019-07-13 LAB — URINALYSIS, ROUTINE W REFLEX MICROSCOPIC
Bilirubin Urine: NEGATIVE
Glucose, UA: NEGATIVE mg/dL
Hgb urine dipstick: NEGATIVE
Ketones, ur: NEGATIVE mg/dL
Leukocytes,Ua: NEGATIVE
Nitrite: NEGATIVE
Protein, ur: NEGATIVE mg/dL
Specific Gravity, Urine: 1.011 (ref 1.005–1.030)
pH: 6 (ref 5.0–8.0)

## 2019-07-13 LAB — RETICULOCYTES
Immature Retic Fract: 26.8 % — ABNORMAL HIGH (ref 2.3–15.9)
RBC.: 2.9 MIL/uL — ABNORMAL LOW (ref 3.87–5.11)
Retic Count, Absolute: 358.2 10*3/uL — ABNORMAL HIGH (ref 19.0–186.0)
Retic Ct Pct: 12.4 % — ABNORMAL HIGH (ref 0.4–3.1)

## 2019-07-13 LAB — I-STAT BETA HCG BLOOD, ED (MC, WL, AP ONLY): I-stat hCG, quantitative: <5 m[IU]/mL (ref <5)

## 2019-07-13 MED ORDER — PROMETHAZINE HCL 25 MG/ML IJ SOLN
25.0000 mg | Freq: Once | INTRAMUSCULAR | Status: AC
  Administered 2019-07-13: 25 mg via INTRAVENOUS
  Filled 2019-07-13: qty 1

## 2019-07-13 MED ORDER — HYDROMORPHONE HCL 2 MG/ML IJ SOLN
2.0000 mg | INTRAMUSCULAR | Status: AC
  Administered 2019-07-13: 2 mg via INTRAVENOUS
  Filled 2019-07-13: qty 1

## 2019-07-13 MED ORDER — HEPARIN SOD (PORK) LOCK FLUSH 100 UNIT/ML IV SOLN
500.0000 [IU] | Freq: Once | INTRAVENOUS | Status: AC
  Administered 2019-07-13: 500 [IU]
  Filled 2019-07-13: qty 5

## 2019-07-13 MED ORDER — DIPHENHYDRAMINE HCL 50 MG/ML IJ SOLN
25.0000 mg | Freq: Once | INTRAMUSCULAR | Status: AC
  Administered 2019-07-13: 25 mg via INTRAVENOUS
  Filled 2019-07-13: qty 1

## 2019-07-13 MED ORDER — SODIUM CHLORIDE 0.45 % IV SOLN: INTRAVENOUS | Status: DC

## 2019-07-13 MED ORDER — SODIUM CHLORIDE 0.9% FLUSH
3.0000 mL | Freq: Once | INTRAVENOUS | Status: AC
  Administered 2019-07-13: 3 mL via INTRAVENOUS

## 2019-07-13 MED ORDER — HYDROMORPHONE HCL 2 MG/ML IJ SOLN
2.0000 mg | Freq: Once | INTRAMUSCULAR | Status: AC
  Administered 2019-07-13: 2 mg via INTRAVENOUS
  Filled 2019-07-13: qty 1

## 2019-07-13 NOTE — ED Notes (Signed)
Pt ambulatory to RR independently. Sample cup provided

## 2019-07-13 NOTE — ED Provider Notes (Signed)
3:55 PM Care assumed from Dr. Jeanell Sparrow.  At time of transfer care, patient is awaiting reassessment after third pain medication administration for sickle cell discomfort.  Work-up was otherwise similar to prior and reassuring.  If symptoms have improved after medications, plan is to discharge patient.  If she is not improved enough, anticipate admission.  Patient received several more doses of pain medication but ultimately started feeling well enough to go home.  She will follow-up with her primary PCP and sickle cell team.  She had no other questions or concerns and was discharged in good condition.  Clinical Impression: 1. Sickle cell pain crisis ([place])     Disposition: Discharge  Condition: Good  I have discussed the results, Dx and Tx plan with the pt(& family if present). He/she/they expressed understanding and agree(s) with the plan. Discharge instructions discussed at great length. Strict return precautions discussed and pt &/or family have verbalized understanding of the instructions. No further questions at time of discharge.    New Prescriptions   No medications on file    Follow Up: Rosana Berger, [facility] [address] [id number] [place]       Robynn Marcel, Gwenyth Allegra, MD 07/14/19 Shelah Lewandowsky

## 2019-07-13 NOTE — ED Provider Notes (Signed)
[facility] DEPT Provider Note   CSN: [id number] Arrival date & time: 07/13/19  1113     History Chief Complaint  Patient presents with  . Sickle Cell Pain Crisis    Molly Gutierrez is a 39 y.o. female.  HPI 40 year old female with history of sickle cell disease, asthma, anemia of chronic disease, PE, on Xarelto presents today complaining of chest pain that has been present constantly for 2 days.  It is in the middle of her chest.  It is severe at 9 out of 10.  She describes it more as pressure type pain.  She denies any dyspnea or cough.  She reports taking her medications as prescribed.  She denies fever or chills.  This is similar to previous admissions with chest pain.Patient denies sexual activity for 3 years.     Past Medical History:  Diagnosis Date  . Asthma   . Eczema   . History of pulmonary embolus (PE)   . Sickle cell anemia [facility])     Patient Active Problem List   Diagnosis Date Noted  . Drug-seeking behavior 07/07/2019  . Sinus tachycardia 07/07/2019  . Therapeutic opioid-induced constipation (OIC) 07/07/2019  . Breast mass in female 06/29/2019  . Atelectasis of right lung 06/29/2019  . Sickle cell crisis ([place]) 06/27/2019  . Sickle cell pain crisis ([place]) 04/19/2019  . Pneumonia 03/27/2019  . 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 Anterior chest wall pain 11/13/2018  . Epigastric abdominal pain 11/13/2018  . Hematuria 11/13/2018  . Hyperbilirubinemia 11/12/2018  . Long term current use of anticoagulant therapy 11/12/2018  . History of pulmonary embolism 09/26/2018  . Acute pulmonary embolism ([place]) 09/26/2018  . Opioid dependence ([place]) 09/13/2018  . Port-A-Cath in place 09/13/2018  . Narcotic abuse, continuous ([place]) 08/28/2018  . Moderate persistent asthma without complication [id number]  . Anemia 07/03/2018  . Personal history of other venous thrombosis and embolism 07/03/2018  . History of transfusion 07/03/2018  .  Gastro-esophageal reflux disease without esophagitis 06/02/2018  . Asthma 05/19/2018  . Anxiety and depression 10/30/2017  . At risk for sepsis 06/13/2017  . Mitral regurgitation 02/01/2014  . Itching 12/13/2013  . Nausea & vomiting 12/13/2013  . 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 Frequent complaints of pain 11/01/2013  . Hypokalemia 09/19/2013  . S/P total hip arthroplasty 05/19/2013  . 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 Lower urinary tract infectious disease 03/02/2013  . Chest pain 11/14/2012  . Sickle-cell anemia ([place]) 10/30/2012  . Pain management 08/01/2012  . Reticulocytosis 07/31/2012  . 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 Leukocytosis 03/27/2012  . History of gestational diabetes 10/07/2011  . 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 CHOLECYSTECTOMY    . ERCP    . JOINT REPLACEMENT    . PORTA CATH INSERTION    . TUBAL LIGATION    . WISDOM TOOTH EXTRACTION       OB History   No obstetric history on file.     Family History  Problem Relation Age of Onset  . Renal Disease Mother   . Hypertension Mother   . High Cholesterol Mother     Social History   Tobacco Use  . Smoking status: Never Smoker  . Smokeless tobacco: Never Used  Substance Use Topics  . Alcohol use: Never  .  Drug use: Never    Home Medications Prior to Admission medications   Medication Sig Start Date End Date Taking? Authorizing Provider  albuterol (VENTOLIN HFA) 108 (90 Base) MCG/ACT inhaler Inhale 2 puffs into the lungs 2 (two) times daily as needed for wheezing or shortness of breath. 12/31/16  Yes [provider]  Cholecalciferol (VITAMIN D3) 25 MCG (1000 UT) CAPS Take 1,000 Units by mouth daily.   Yes [provider]    Deferasirox 360 MG TABS Take 1,080 mg by mouth daily. 09/27/16  Yes [provider]  diphenhydrAMINE (BENADRYL) 25 mg capsule Take 25 mg by mouth 3 (three) times daily as needed for itching.   Yes [provider]  folic acid (FOLVITE) 1 MG tablet Take 1 mg by mouth daily. 12/31/16  Yes [provider]  mirtazapine (REMERON) 45 MG tablet Take 45 mg by mouth at bedtime. 09/30/18  Yes [provider]  mometasone-formoterol (DULERA) 100-5 MCG/ACT AERO Inhale 2 puffs into the lungs daily as needed for wheezing or shortness of breath.   Yes [provider]  omeprazole (PRILOSEC) 20 MG capsule Take 20 mg by mouth 2 (two) times a day. 09/08/18  Yes [provider]  oxyCODONE 10 MG TABS Take 1 tablet (10 mg total) by mouth every 4 (four) hours while awake. 07/02/19  Yes [name], FNP  promethazine (PHENERGAN) 25 MG tablet Take 25 mg by mouth every 8 (eight) hours as needed for nausea/vomiting. 06/22/19  Yes [provider]  vitamin B-12 (CYANOCOBALAMIN) 1000 MCG tablet Take 1,000 mcg by mouth daily.   Yes [provider]  voxelotor (OXBRYTA) 500 MG TABS tablet Take 1,500 mg by mouth daily.   Yes [provider]  XARELTO 20 MG TABS tablet Take 1 tablet (20 mg total) by mouth daily. 05/14/19  Yes [name], MD  oxyCODONE (OXYCONTIN) 10 mg 12 hr tablet Take 1 tablet (10 mg total) by mouth every 12 (twelve) hours. Patient not taking: Reported on 07/09/2019 07/02/19   [name], FNP    Allergies    Cefaclor and Hydroxyurea  Review of Systems   Review of Systems  All other systems reviewed and are negative.   Physical Exam Updated Vital Signs BP 102/65   Pulse 88   Temp 99.2 F (37.3 C)   Resp 16   Ht 1.6 m (5\' 3" )   Wt 56.7 kg   LMP 06/30/2019   SpO2 96%   BMI 22.14 kg/m   Physical Exam Vitals and nursing note reviewed.  HENT:     Head: Normocephalic.     Right Ear: External ear normal.      Left Ear: External ear normal.     Nose: Nose normal.     Mouth/Throat:     Mouth: Mucous membranes are moist.  Eyes:     Extraocular Movements: Extraocular movements intact.     Pupils: Pupils are equal, round, and reactive to light.  Cardiovascular:     Rate and Rhythm: Normal rate and regular rhythm.     Pulses: Normal pulses.     Comments: Chest wall with ports bilaterally Pulmonary:     Effort: Pulmonary effort is normal.     Breath sounds: Normal breath sounds.  Abdominal:     General: Abdomen is flat.     Palpations: Abdomen is soft.  Musculoskeletal:        General: Normal range of motion.     Cervical back: Normal range of  motion.  Skin:    General: Skin is warm and dry.     Capillary Refill: Capillary refill takes less than 2 seconds.  Neurological:     General: No focal deficit present.     Mental Status: She is alert and oriented to person, place, and time.  Psychiatric:        Mood and Affect: Mood normal.     ED Results / Procedures / Treatments   Labs (all labs ordered are listed, but only abnormal results are displayed) Labs Reviewed  CBC WITH DIFFERENTIAL/PLATELET - Abnormal; Notable for the following components:      Result Value   WBC 18.4 (*)    RBC 2.91 (*)    Hemoglobin 8.4 (*)    HCT 25.1 (*)    RDW 19.7 (*)    Platelets 652 (*)    nRBC 0.4 (*)    Neutro Abs 12.3 (*)    Monocytes Absolute 2.0 (*)    Abs Immature Granulocytes 0.16 (*)    All other components within normal limits  RETICULOCYTES - Abnormal; Notable for the following components:   Retic Ct Pct 12.4 (*)    RBC. 2.90 (*)    Retic Count, Absolute 358.2 (*)    Immature Retic Fract 26.8 (*)    All other components within normal limits  COMPREHENSIVE METABOLIC PANEL  URINALYSIS, ROUTINE W REFLEX MICROSCOPIC  I-STAT BETA HCG BLOOD, ED ([facility], [facility], [facility] ONLY)  I-STAT BETA HCG BLOOD, ED ([facility], [facility], [facility] ONLY)    EKG None  Radiology DG Chest 2 View  Result Date: 07/13/2019 CLINICAL  DATA:  Chest pain beginning 2 days ago with progression. Sickle cell disease. EXAM: CHEST - 2 VIEW COMPARISON:  One-view chest x-Robbye Dede 07/07/2019. FINDINGS: The heart size is normal. Bilateral Port-A-Cath is are again noted. Chronic interstitial changes are present. There is no edema or effusion. No focal airspace disease is present. Atherosclerotic calcifications are present at the gallbladder fossa. IMPRESSION: No acute cardiopulmonary disease or significant interval change. Electronically Signed   By: San Morelle M.D.   On: 07/13/2019 12:29    Procedures Procedures (including critical care time)  Medications Ordered in ED Medications  0.45 % sodium chloride infusion (has no administration in time range)  HYDROmorphone (DILAUDID) injection 2 mg (has no administration in time range)  HYDROmorphone (DILAUDID) injection 2 mg (has no administration in time range)  diphenhydrAMINE (BENADRYL) injection 25 mg (has no administration in time range)  promethazine (PHENERGAN) injection 25 mg (has no administration in time range)  sodium chloride flush (NS) 0.9 % injection 3 mL (3 mLs Intravenous Given 07/13/19 1309)    ED Course  I have reviewed the triage vital signs and the nursing notes.  Pertinent labs & imaging results that were available during my care of the patient were reviewed by me and considered in my medical decision making (see chart for details). 39 yo female with ss disease presents with chest pain- DDX- mi , pe, acute chest syndrome, infection, pneumo/ other structural etiology. CXR clear. EKG without stemi, nsst unchanged from prior. Leukocytosis stable from prior HGB stable at 8.4 Patient feels somewhat improved but states she feels she needs third dose of medicine. Discussed with Dr. Sherry Ruffing and he will follow up    MDM Rules/Calculators/A&P                      Final Clinical Impression(s) / ED Diagnoses Final diagnoses:  Sickle cell pain crisis ([place])  Rx / DC  Orders ED Discharge Orders    None       Pattricia Boss, MD 07/13/19 1539

## 2019-07-13 NOTE — ED Triage Notes (Signed)
Patient reports pain in chest and legs that started 2 days ago that is increasing

## 2019-07-13 NOTE — ED Notes (Signed)
Pt requesting additional pain medication. Pain 10/10. Messaged provider.

## 2019-07-13 NOTE — Discharge Instructions (Signed)
Please follow-up with your primary sickle cell team and PCP.  If any symptoms change or worsen, please return to the nearest emergency department.  Please rest and stay hydrated.

## 2019-07-25 ENCOUNTER — Encounter (HOSPITAL_COMMUNITY): Payer: Self-pay

## 2019-07-25 ENCOUNTER — Emergency Department (HOSPITAL_COMMUNITY): Payer: Medicare Other

## 2019-07-25 ENCOUNTER — Other Ambulatory Visit: Payer: Self-pay

## 2019-07-25 ENCOUNTER — Emergency Department (HOSPITAL_COMMUNITY)
Admission: EM | Admit: 2019-07-25 | Discharge: 2019-07-25 | Disposition: A | Discharge status: Home / Self Care | Payer: Medicare Other | Attending: Emergency Medicine | Admitting: Emergency Medicine

## 2019-07-25 DIAGNOSIS — J45909 Unspecified asthma, uncomplicated: Secondary | ICD-10-CM | POA: Insufficient documentation

## 2019-07-25 DIAGNOSIS — Z79899 Other long term (current) drug therapy: Secondary | ICD-10-CM | POA: Diagnosis not present

## 2019-07-25 DIAGNOSIS — R0789 Other chest pain: Secondary | ICD-10-CM | POA: Diagnosis present

## 2019-07-25 DIAGNOSIS — Z9660 Presence of unspecified orthopedic joint implant: Secondary | ICD-10-CM | POA: Diagnosis not present

## 2019-07-25 DIAGNOSIS — D5700 Hb-SS disease with crisis, unspecified: Secondary | ICD-10-CM | POA: Insufficient documentation

## 2019-07-25 LAB — COMPREHENSIVE METABOLIC PANEL
ALT: 22 U/L (ref 0–44)
AST: 30 U/L (ref 15–41)
Albumin: 4.6 g/dL (ref 3.5–5.0)
Alkaline Phosphatase: 57 U/L (ref 38–126)
Anion gap: 13 (ref 5–15)
BUN: 6 mg/dL (ref 6–20)
CO2: 21 mmol/L — ABNORMAL LOW (ref 22–32)
Calcium: 8.8 mg/dL — ABNORMAL LOW (ref 8.9–10.3)
Chloride: 102 mmol/L (ref 98–111)
Creatinine, Ser: 0.46 mg/dL (ref 0.44–1.00)
GFR calc Af Amer: >60 mL/min (ref >60)
GFR calc non Af Amer: >60 mL/min (ref >60)
Glucose, Bld: 124 mg/dL — ABNORMAL HIGH (ref 70–99)
Potassium: 3 mmol/L — ABNORMAL LOW (ref 3.5–5.1)
Sodium: 136 mmol/L (ref 135–145)
Total Bilirubin: 3.5 mg/dL — ABNORMAL HIGH (ref 0.3–1.2)
Total Protein: 8.3 g/dL — ABNORMAL HIGH (ref 6.5–8.1)

## 2019-07-25 LAB — CBC WITH DIFFERENTIAL/PLATELET
Abs Immature Granulocytes: 0.07 10*3/uL (ref 0.00–0.07)
Basophils Absolute: 0.1 10*3/uL (ref 0.0–0.1)
Basophils Relative: 0 %
Eosinophils Absolute: 0.1 10*3/uL (ref 0.0–0.5)
Eosinophils Relative: 0 %
HCT: 27 % — ABNORMAL LOW (ref 36.0–46.0)
Hemoglobin: 8.8 g/dL — ABNORMAL LOW (ref 12.0–15.0)
Immature Granulocytes: 1 %
Lymphocytes Relative: 11 %
Lymphs Abs: 1.6 10*3/uL (ref 0.7–4.0)
MCH: 28.3 pg (ref 26.0–34.0)
MCHC: 32.6 g/dL (ref 30.0–36.0)
MCV: 86.8 fL (ref 80.0–100.0)
Monocytes Absolute: 1.4 10*3/uL — ABNORMAL HIGH (ref 0.1–1.0)
Monocytes Relative: 9 %
Neutro Abs: 12.3 10*3/uL — ABNORMAL HIGH (ref 1.7–7.7)
Neutrophils Relative %: 79 %
Platelets: 677 10*3/uL — ABNORMAL HIGH (ref 150–400)
RBC: 3.11 MIL/uL — ABNORMAL LOW (ref 3.87–5.11)
RDW: 18.3 % — ABNORMAL HIGH (ref 11.5–15.5)
WBC: 15.5 10*3/uL — ABNORMAL HIGH (ref 4.0–10.5)
nRBC: 0 % (ref 0.0–0.2)

## 2019-07-25 LAB — RETICULOCYTES
Immature Retic Fract: 11.7 % (ref 2.3–15.9)
RBC.: 3.12 MIL/uL — ABNORMAL LOW (ref 3.87–5.11)
Retic Count, Absolute: 82.7 10*3/uL (ref 19.0–186.0)
Retic Ct Pct: 2.7 % (ref 0.4–3.1)

## 2019-07-25 LAB — I-STAT BETA HCG BLOOD, ED (MC, WL, AP ONLY): I-stat hCG, quantitative: <5 m[IU]/mL (ref <5)

## 2019-07-25 LAB — TROPONIN I (HIGH SENSITIVITY)
Troponin I (High Sensitivity): 3 ng/L (ref <18)
Troponin I (High Sensitivity): 3 ng/L (ref <18)

## 2019-07-25 MED ORDER — DIPHENHYDRAMINE HCL 50 MG/ML IJ SOLN
25.0000 mg | Freq: Once | INTRAMUSCULAR | Status: AC
  Administered 2019-07-25: 25 mg via INTRAVENOUS
  Filled 2019-07-25: qty 1

## 2019-07-25 MED ORDER — SODIUM CHLORIDE 0.9 % IV BOLUS (SEPSIS)
1000.0000 mL | Freq: Once | INTRAVENOUS | Status: AC
  Administered 2019-07-25: 1000 mL via INTRAVENOUS

## 2019-07-25 MED ORDER — HYDROMORPHONE HCL 2 MG/ML IJ SOLN
2.0000 mg | Freq: Once | INTRAMUSCULAR | Status: AC
  Administered 2019-07-25: 2 mg via INTRAVENOUS
  Filled 2019-07-25: qty 1

## 2019-07-25 MED ORDER — HEPARIN SOD (PORK) LOCK FLUSH 100 UNIT/ML IV SOLN
INTRAVENOUS | Status: AC
  Filled 2019-07-25: qty 5

## 2019-07-25 MED ORDER — DIPHENHYDRAMINE HCL 50 MG/ML IJ SOLN
12.5000 mg | Freq: Once | INTRAMUSCULAR | Status: AC
  Administered 2019-07-25: 22:00:00 12.5 mg via INTRAVENOUS
  Filled 2019-07-25: qty 1

## 2019-07-25 MED ORDER — DIPHENHYDRAMINE HCL 50 MG/ML IJ SOLN
25.0000 mg | Freq: Once | INTRAMUSCULAR | Status: AC
  Administered 2019-07-25: 19:00:00 25 mg via INTRAVENOUS
  Filled 2019-07-25: qty 1

## 2019-07-25 MED ORDER — SODIUM CHLORIDE 0.9% FLUSH: 3.0000 mL | Freq: Once | INTRAVENOUS | Status: DC

## 2019-07-25 MED ORDER — KETOROLAC TROMETHAMINE 15 MG/ML IJ SOLN
15.0000 mg | Freq: Once | INTRAMUSCULAR | Status: AC
  Administered 2019-07-25: 15 mg via INTRAVENOUS
  Filled 2019-07-25: qty 1

## 2019-07-25 MED ORDER — SODIUM CHLORIDE 0.9% FLUSH
3.0000 mL | Freq: Once | INTRAVENOUS | Status: AC
  Administered 2019-07-25: 3 mL via INTRAVENOUS

## 2019-07-25 MED ORDER — POTASSIUM CHLORIDE CRYS ER 20 MEQ PO TBCR
40.0000 meq | EXTENDED_RELEASE_TABLET | Freq: Once | ORAL | Status: AC
  Administered 2019-07-25: 40 meq via ORAL
  Filled 2019-07-25: qty 2

## 2019-07-25 NOTE — ED Notes (Signed)
Patient transported to X-ray 

## 2019-07-25 NOTE — ED Triage Notes (Signed)
Pt presents with c/o sickle cell pain in her chest. Pt reports that the pain started 2 days ago.

## 2019-07-25 NOTE — ED Provider Notes (Signed)
[facility] DEPT Provider Note   CSN: [id number] Arrival date & time: 07/25/19  1809     History Chief Complaint  Patient presents with  . Sickle Cell Pain Crisis  . Chest Pain    Molly Gutierrez is a 39 y.o. female.  Patient is a 39 year old female with past medical history of sickle cell anemia presenting to the emergency department for sickle cell pain.  Patient reports that about 2 days ago she began to have symptoms of her usual pain crisis which is pain in her chest.  She denies any fevers, chills, URI symptoms.  Reports that she is taking her prescribed oxycodone without any benefit.  She reports some associated shortness of breath and she did take her inhaler for this.        Past Medical History:  Diagnosis Date  . Asthma   . Eczema   . History of pulmonary embolus (PE)   . Sickle cell anemia [facility])     Patient Active Problem List   Diagnosis Date Noted  . Drug-seeking behavior 07/07/2019  . Sinus tachycardia 07/07/2019  . Therapeutic opioid-induced constipation (OIC) 07/07/2019  . Breast mass in female 06/29/2019  . Atelectasis of right lung 06/29/2019  . Sickle cell crisis ([place]) 06/27/2019  . Sickle cell pain crisis ([place]) 04/19/2019  . Pneumonia 03/27/2019  . 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 Anterior chest wall pain 11/13/2018  . Epigastric abdominal pain 11/13/2018  . Hematuria 11/13/2018  . Hyperbilirubinemia 11/12/2018  . Long term current use of anticoagulant therapy 11/12/2018  . History of pulmonary embolism 09/26/2018  . Acute pulmonary embolism ([place]) 09/26/2018  . Opioid dependence ([place]) 09/13/2018  . Port-A-Cath in place 09/13/2018  . Narcotic abuse, continuous ([place]) 08/28/2018  . Moderate persistent asthma without complication [id number]  . Anemia 07/03/2018  . Personal history of other venous thrombosis and embolism 07/03/2018  . History of transfusion 07/03/2018  . Gastro-esophageal reflux disease  without esophagitis 06/02/2018  . Asthma 05/19/2018  . Anxiety and depression 10/30/2017  . At risk for sepsis 06/13/2017  . Mitral regurgitation 02/01/2014  . Itching 12/13/2013  . Nausea & vomiting 12/13/2013  . 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 Frequent complaints of pain 11/01/2013  . Hypokalemia 09/19/2013  . S/P total hip arthroplasty 05/19/2013  . 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 Lower urinary tract infectious disease 03/02/2013  . Chest pain 11/14/2012  . Sickle-cell anemia ([place]) 10/30/2012  . Pain management 08/01/2012  . Reticulocytosis 07/31/2012  . 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 Leukocytosis 03/27/2012  . History of gestational diabetes 10/07/2011  . 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 CHOLECYSTECTOMY    . ERCP    . JOINT REPLACEMENT    . PORTA CATH INSERTION    . TUBAL LIGATION    . WISDOM TOOTH EXTRACTION       OB History   No obstetric history on file.     Family History  Problem Relation Age of Onset  . Renal Disease Mother   . Hypertension Mother   . High Cholesterol Mother     Social History   Tobacco Use  . Smoking status: Never Smoker  . Smokeless tobacco: Never Used  Substance Use Topics  . Alcohol use: Never  . Drug use: Never  Home Medications Prior to Admission medications   Medication Sig Start Date End Date Taking? Authorizing Provider  albuterol (VENTOLIN HFA) 108 (90 Base) MCG/ACT inhaler Inhale 2 puffs into the lungs 2 (two) times daily as needed for wheezing or shortness of breath. 12/31/16  Yes [provider]  Cholecalciferol (VITAMIN D3) 25 MCG (1000 UT) CAPS Take 1,000 Units by mouth daily.   Yes [provider]  Deferasirox 360 MG TABS Take 1,080 mg  by mouth daily. 09/27/16  Yes [provider]  diphenhydrAMINE (BENADRYL) 25 mg capsule Take 25 mg by mouth 3 (three) times daily as needed for itching.   Yes [provider]  folic acid (FOLVITE) 1 MG tablet Take 1 mg by mouth daily. 12/31/16  Yes [provider]  mirtazapine (REMERON) 45 MG tablet Take 45 mg by mouth at bedtime. 09/30/18  Yes [provider]  mometasone-formoterol (DULERA) 100-5 MCG/ACT AERO Inhale 2 puffs into the lungs daily as needed for wheezing or shortness of breath.   Yes [provider]  omeprazole (PRILOSEC) 20 MG capsule Take 20 mg by mouth 2 (two) times a day. 09/08/18  Yes [provider]  oxyCODONE (OXYCONTIN) 10 mg 12 hr tablet Take 1 tablet (10 mg total) by mouth every 12 (twelve) hours. 07/02/19  Yes [name], FNP  oxyCODONE 10 MG TABS Take 1 tablet (10 mg total) by mouth every 4 (four) hours while awake. Patient taking differently: Take 10 mg by mouth every 4 (four) hours as needed (pain). Every 4 hours while awake 07/02/19  Yes [name], FNP  promethazine (PHENERGAN) 25 MG tablet Take 25 mg by mouth every 8 (eight) hours as needed for nausea/vomiting. 06/22/19  Yes [provider]  vitamin B-12 (CYANOCOBALAMIN) 1000 MCG tablet Take 1,000 mcg by mouth daily.   Yes [provider]  voxelotor (OXBRYTA) 500 MG TABS tablet Take 1,500 mg by mouth daily.   Yes [provider]  XARELTO 20 MG TABS tablet Take 1 tablet (20 mg total) by mouth daily. 05/14/19  Yes [name], MD    Allergies    Cefaclor and Hydroxyurea  Review of Systems   Review of Systems  Constitutional: Negative for appetite change, chills, fatigue and fever.  HENT: Negative for congestion, sinus pain and sore throat.   Respiratory: Positive for shortness of breath. Negative for cough and chest tightness.   Cardiovascular: Positive for chest pain. Negative for palpitations and leg swelling.    Gastrointestinal: Positive for nausea and vomiting. Negative for abdominal pain.  Genitourinary: Negative for dysuria, vaginal bleeding and vaginal discharge.  Musculoskeletal: Negative for arthralgias, back pain and neck pain.  Skin: Negative for rash.  Neurological: Negative for dizziness, light-headedness and headaches.  Hematological: Does not bruise/bleed easily.    Physical Exam Updated Vital Signs BP 104/72   Pulse 80   Resp 14   Ht 5\' 3"  (1.6 m)   Wt 57 kg   LMP 07/18/2019 (Approximate)   SpO2 99%   BMI 22.26 kg/m   Physical Exam Vitals and nursing note reviewed.  Constitutional:      General: She is not in acute distress.    Appearance: Normal appearance. She is well-developed. She is not ill-appearing, toxic-appearing or diaphoretic.  HENT:     Head: Normocephalic.  Eyes:     Conjunctiva/sclera: Conjunctivae normal.     Pupils: Pupils are equal, round, and reactive to light.  Cardiovascular:     Rate and Rhythm: Regular  rhythm. Tachycardia present.  Pulmonary:     Effort: Pulmonary effort is normal.     Breath sounds: Normal breath sounds.  Musculoskeletal:     Right lower leg: No edema.     Left lower leg: No edema.  Skin:    General: Skin is dry.  Neurological:     Mental Status: She is alert.  Psychiatric:     Comments: tearful     ED Results / Procedures / Treatments   Labs (all labs ordered are listed, but only abnormal results are displayed) Labs Reviewed  COMPREHENSIVE METABOLIC PANEL - Abnormal; Notable for the following components:      Result Value   Potassium 3.0 (*)    CO2 21 (*)    Glucose, Bld 124 (*)    Calcium 8.8 (*)    Total Protein 8.3 (*)    Total Bilirubin 3.5 (*)    All other components within normal limits  CBC WITH DIFFERENTIAL/PLATELET - Abnormal; Notable for the following components:   WBC 15.5 (*)    RBC 3.11 (*)    Hemoglobin 8.8 (*)    HCT 27.0 (*)    RDW 18.3 (*)    Platelets 677 (*)    Neutro Abs 12.3 (*)     Monocytes Absolute 1.4 (*)    All other components within normal limits  RETICULOCYTES - Abnormal; Notable for the following components:   RBC. 3.12 (*)    All other components within normal limits  I-STAT BETA HCG BLOOD, ED ([facility], [facility], [facility] ONLY)  TROPONIN I (HIGH SENSITIVITY)  TROPONIN I (HIGH SENSITIVITY)    EKG EKG Interpretation  Date/Time:  Sunday July 25 2019 18:25:41 EST Ventricular Rate:  126 PR Interval:    QRS Duration: 98 QT Interval:  301 QTC Calculation: 436 R Axis:   48 Text Interpretation: Sinus tachycardia LAE, consider biatrial enlargement Low voltage, precordial leads Borderline repolarization abnormality Baseline wander in lead(s) I II III aVR aVF V1 V4 nonspecific t wave inversions increased since 1/21 Confirmed by Aletta Edouard [phone]) on 07/25/2019 6:45:05 PM   Radiology DG Chest 2 View  Result Date: 07/25/2019 CLINICAL DATA:  Chest pain, sickle cell EXAM: CHEST - 2 VIEW COMPARISON:  07/12/2018 FINDINGS: The heart size and mediastinal contours are within normal limits. Left and right chest port catheters. Both lungs are clear. The visualized skeletal structures are unremarkable. IMPRESSION: No acute abnormality of the lungs. Electronically Signed   By: Eddie Candle M.D.   On: 07/25/2019 18:52    Procedures Procedures (including critical care time)  Medications Ordered in ED Medications  sodium chloride flush (NS) 0.9 % injection 3 mL (3 mLs Intravenous Given 07/25/19 1902)  HYDROmorphone (DILAUDID) injection 2 mg (2 mg Intravenous Given 07/25/19 1901)  diphenhydrAMINE (BENADRYL) injection 25 mg (25 mg Intravenous Given 07/25/19 1900)  sodium chloride 0.9 % bolus 1,000 mL (0 mLs Intravenous Stopped 07/25/19 2028)  ketorolac (TORADOL) 15 MG/ML injection 15 mg (15 mg Intravenous Given 07/25/19 1900)  HYDROmorphone (DILAUDID) injection 2 mg (2 mg Intravenous Given 07/25/19 2027)  diphenhydrAMINE (BENADRYL) injection 25 mg (25 mg Intravenous Given 07/25/19 2028)  sodium  chloride 0.9 % bolus 1,000 mL (0 mLs Intravenous Stopped 07/25/19 2133)  HYDROmorphone (DILAUDID) injection 2 mg (2 mg Intravenous Given 07/25/19 2148)  diphenhydrAMINE (BENADRYL) injection 12.5 mg (12.5 mg Intravenous Given 07/25/19 2148)  potassium chloride SA (KLOR-CON) CR tablet 40 mEq (40 mEq Oral Given 07/25/19 2245)  heparin lock flush 100 UNIT/ML injection (  Given 07/25/19 2254)    ED Course  I have reviewed the triage vital signs and the nursing notes.  Pertinent labs & imaging results that were available during my care of the patient were reviewed by me and considered in my medical decision making (see chart for details).  Clinical Course as of Jul 25 2319  Nancy Fetter Jul 25, 2019  2201 Patient with sickle cell anemia presenting to the emergency department for reported pain crisis over the last couple of days.  Patient presenting appearing well, afebrile but mildly tachycardic.  Her labs are reassuring with a normal reticulocyte count and hemoglobin stable at 8.8.  She did have mild hypokalemia at 3.0.  She was improved with pain medication and agreeable for discharge home.  Advised on return precautions.   [KM]    Clinical Course User Index [KM] Kristine Royal   MDM Rules/Calculators/A&P                      Based on review of vitals, medical screening exam, lab work and/or imaging, there does not appear to be an acute, emergent etiology for the patient's symptoms. Counseled pt on good return precautions and encouraged both PCP and ED follow-up as needed.  Prior to discharge, I also discussed incidental imaging findings with patient in detail and advised appropriate, recommended follow-up in detail.  Clinical Impression: 1. Sickle cell pain crisis ([place])     Disposition: Discharge  Prior to providing a prescription for a controlled substance, I independently reviewed the patient's recent prescription history on the [facility]. The patient  had no recent or regular prescriptions and was deemed appropriate for a brief, less than 3 day prescription of narcotic for acute analgesia.  This note was prepared with assistance of Systems analyst. Occasional wrong-word or sound-a-like substitutions may have occurred due to the inherent limitations of voice recognition software.  Final Clinical Impression(s) / ED Diagnoses Final diagnoses:  Sickle cell pain crisis [facility])    Rx / DC Orders ED Discharge Orders    None       Kristine Royal 07/25/19 2321    Hayden Rasmussen, MD 07/26/19 [phone]

## 2019-07-25 NOTE — Discharge Instructions (Signed)
Thank you for allowing me to care for you today. Please return to the emergency department if you have new or worsening symptoms. Take your medications as instructed.  ° °

## 2019-07-28 ENCOUNTER — Inpatient Hospital Stay (HOSPITAL_COMMUNITY)
Admission: EM | Admit: 2019-07-28 | Discharge: 2019-08-04 | DRG: 812 | Disposition: A | Discharge status: Home / Self Care | Payer: Medicare Other | Attending: Internal Medicine | Admitting: Internal Medicine

## 2019-07-28 ENCOUNTER — Encounter (HOSPITAL_COMMUNITY): Payer: Self-pay | Admitting: Emergency Medicine

## 2019-07-28 ENCOUNTER — Emergency Department (HOSPITAL_COMMUNITY): Payer: Medicare Other

## 2019-07-28 ENCOUNTER — Other Ambulatory Visit: Payer: Self-pay

## 2019-07-28 DIAGNOSIS — D5701 Hb-SS disease with acute chest syndrome: Secondary | ICD-10-CM | POA: Diagnosis present

## 2019-07-28 DIAGNOSIS — D638 Anemia in other chronic diseases classified elsewhere: Secondary | ICD-10-CM | POA: Diagnosis present

## 2019-07-28 DIAGNOSIS — D5700 Hb-SS disease with crisis, unspecified: Secondary | ICD-10-CM

## 2019-07-28 DIAGNOSIS — E876 Hypokalemia: Secondary | ICD-10-CM | POA: Diagnosis present

## 2019-07-28 DIAGNOSIS — Z8249 Family history of ischemic heart disease and other diseases of the circulatory system: Secondary | ICD-10-CM | POA: Diagnosis not present

## 2019-07-28 DIAGNOSIS — R112 Nausea with vomiting, unspecified: Secondary | ICD-10-CM | POA: Diagnosis not present

## 2019-07-28 DIAGNOSIS — D72829 Elevated white blood cell count, unspecified: Secondary | ICD-10-CM | POA: Diagnosis present

## 2019-07-28 DIAGNOSIS — Z881 Allergy status to other antibiotic agents status: Secondary | ICD-10-CM | POA: Diagnosis not present

## 2019-07-28 DIAGNOSIS — G894 Chronic pain syndrome: Secondary | ICD-10-CM | POA: Diagnosis present

## 2019-07-28 DIAGNOSIS — Z20822 Contact with and (suspected) exposure to covid-19: Secondary | ICD-10-CM | POA: Diagnosis present

## 2019-07-28 DIAGNOSIS — Z86711 Personal history of pulmonary embolism: Secondary | ICD-10-CM | POA: Diagnosis not present

## 2019-07-28 DIAGNOSIS — Z7901 Long term (current) use of anticoagulants: Secondary | ICD-10-CM | POA: Diagnosis not present

## 2019-07-28 DIAGNOSIS — Z8349 Family history of other endocrine, nutritional and metabolic diseases: Secondary | ICD-10-CM | POA: Diagnosis not present

## 2019-07-28 DIAGNOSIS — Z841 Family history of disorders of kidney and ureter: Secondary | ICD-10-CM | POA: Diagnosis not present

## 2019-07-28 DIAGNOSIS — F1120 Opioid dependence, uncomplicated: Secondary | ICD-10-CM

## 2019-07-28 DIAGNOSIS — J4520 Mild intermittent asthma, uncomplicated: Secondary | ICD-10-CM | POA: Diagnosis present

## 2019-07-28 DIAGNOSIS — D473 Essential (hemorrhagic) thrombocythemia: Secondary | ICD-10-CM

## 2019-07-28 LAB — COMPREHENSIVE METABOLIC PANEL
ALT: 21 U/L (ref 0–44)
AST: 22 U/L (ref 15–41)
Albumin: 4.5 g/dL (ref 3.5–5.0)
Alkaline Phosphatase: 62 U/L (ref 38–126)
Anion gap: 8 (ref 5–15)
BUN: 5 mg/dL — ABNORMAL LOW (ref 6–20)
CO2: 24 mmol/L (ref 22–32)
Calcium: 9.1 mg/dL (ref 8.9–10.3)
Chloride: 107 mmol/L (ref 98–111)
Creatinine, Ser: 0.35 mg/dL — ABNORMAL LOW (ref 0.44–1.00)
GFR calc Af Amer: >60 mL/min (ref >60)
GFR calc non Af Amer: >60 mL/min (ref >60)
Glucose, Bld: 99 mg/dL (ref 70–99)
Potassium: 3.2 mmol/L — ABNORMAL LOW (ref 3.5–5.1)
Sodium: 139 mmol/L (ref 135–145)
Total Bilirubin: 4.4 mg/dL — ABNORMAL HIGH (ref 0.3–1.2)
Total Protein: 8.2 g/dL — ABNORMAL HIGH (ref 6.5–8.1)

## 2019-07-28 LAB — CBC WITH DIFFERENTIAL/PLATELET
Abs Immature Granulocytes: 0.09 10*3/uL — ABNORMAL HIGH (ref 0.00–0.07)
Basophils Absolute: 0.1 10*3/uL (ref 0.0–0.1)
Basophils Relative: 1 %
Eosinophils Absolute: 0.1 10*3/uL (ref 0.0–0.5)
Eosinophils Relative: 1 %
HCT: 25.8 % — ABNORMAL LOW (ref 36.0–46.0)
Hemoglobin: 8.4 g/dL — ABNORMAL LOW (ref 12.0–15.0)
Immature Granulocytes: 1 %
Lymphocytes Relative: 16 %
Lymphs Abs: 2.9 10*3/uL (ref 0.7–4.0)
MCH: 28.5 pg (ref 26.0–34.0)
MCHC: 32.6 g/dL (ref 30.0–36.0)
MCV: 87.5 fL (ref 80.0–100.0)
Monocytes Absolute: 1.9 10*3/uL — ABNORMAL HIGH (ref 0.1–1.0)
Monocytes Relative: 11 %
Neutro Abs: 12.9 10*3/uL — ABNORMAL HIGH (ref 1.7–7.7)
Neutrophils Relative %: 70 %
Platelets: 654 10*3/uL — ABNORMAL HIGH (ref 150–400)
RBC: 2.95 MIL/uL — ABNORMAL LOW (ref 3.87–5.11)
RDW: 18.6 % — ABNORMAL HIGH (ref 11.5–15.5)
WBC: 17.9 10*3/uL — ABNORMAL HIGH (ref 4.0–10.5)
nRBC: 0.1 % (ref 0.0–0.2)

## 2019-07-28 LAB — RETICULOCYTES
Immature Retic Fract: 27.4 % — ABNORMAL HIGH (ref 2.3–15.9)
RBC.: 2.94 MIL/uL — ABNORMAL LOW (ref 3.87–5.11)
Retic Count, Absolute: 110.8 10*3/uL (ref 19.0–186.0)
Retic Ct Pct: 3.8 % — ABNORMAL HIGH (ref 0.4–3.1)

## 2019-07-28 LAB — I-STAT BETA HCG BLOOD, ED (MC, WL, AP ONLY): I-stat hCG, quantitative: <5 m[IU]/mL (ref <5)

## 2019-07-28 LAB — TROPONIN I (HIGH SENSITIVITY): Troponin I (High Sensitivity): <2 ng/L (ref <18)

## 2019-07-28 LAB — LIPASE, BLOOD: Lipase: 29 U/L (ref 11–51)

## 2019-07-28 MED ORDER — HYDROMORPHONE HCL 1 MG/ML IJ SOLN
0.5000 mg | Freq: Once | INTRAMUSCULAR | Status: DC
  Filled 2019-07-28: qty 1

## 2019-07-28 MED ORDER — POTASSIUM CHLORIDE CRYS ER 20 MEQ PO TBCR
40.0000 meq | EXTENDED_RELEASE_TABLET | Freq: Once | ORAL | Status: AC
  Administered 2019-07-28: 40 meq via ORAL
  Filled 2019-07-28: qty 2

## 2019-07-28 MED ORDER — SODIUM CHLORIDE 0.9% FLUSH
3.0000 mL | Freq: Once | INTRAVENOUS | Status: AC
  Administered 2019-07-29: 3 mL via INTRAVENOUS

## 2019-07-28 MED ORDER — PROMETHAZINE HCL 25 MG/ML IJ SOLN
12.5000 mg | Freq: Once | INTRAMUSCULAR | Status: AC
  Administered 2019-07-28: 12.5 mg via INTRAVENOUS
  Filled 2019-07-28: qty 1

## 2019-07-28 MED ORDER — HYDROMORPHONE HCL 2 MG/ML IJ SOLN
2.0000 mg | INTRAMUSCULAR | Status: AC
  Administered 2019-07-28: 2 mg via INTRAVENOUS
  Filled 2019-07-28: qty 1

## 2019-07-28 MED ORDER — DIPHENHYDRAMINE HCL 50 MG/ML IJ SOLN
25.0000 mg | Freq: Once | INTRAMUSCULAR | Status: AC
  Administered 2019-07-28: 25 mg via INTRAVENOUS
  Filled 2019-07-28: qty 1

## 2019-07-28 MED ORDER — ONDANSETRON 4 MG PO TBDP
4.0000 mg | ORAL_TABLET | Freq: Once | ORAL | Status: DC
  Filled 2019-07-28: qty 1

## 2019-07-28 NOTE — Discharge Instructions (Addendum)
Today you received medications that may make you sleepy or impair your ability to make decisions.  For the next 24 hours please do not drive, operate heavy machinery, care for a small child with out another adult present, or perform any activities that may cause harm to you or someone else if you were to fall asleep or be impaired.   

## 2019-07-28 NOTE — ED Provider Notes (Signed)
[facility] DEPT Provider Note   CSN: [id number] Arrival date & time: 07/28/19  2016     History Chief Complaint  Patient presents with  . Sickle Cell Pain Crisis    Molly Gutierrez is a 39 y.o. female with a past medical history of sickle cell anemia, PE, reported drug-seeking behavior, who presents today for evaluation of sickle cell pain crisis. She reports that over the past 3 to 4 days she has had worsening sickle cell related pain.  She states that her pain is in her chest, and her left leg.  She states that these are locations where she usually has her sickle cell pain.  She states that she is not concerned about acute chest or other underlying serious cause for her pain and symptoms today, rather that she knows this is a sickle cell crisis.  She denies any fevers or shortness of breath. She reports that she has been taking her prescribed home opioids without relief of her symptoms.  She reports that she has been nauseous and vomited twice today which she states again is usual with her sickle cell pain flares. She is primarily managed at [facility].  Chart review shows that she had a recent extended stay for sickle cell pain crisis, where she was admitted from 1/28 until 07/25/2019.  Since her discharge 3 days ago this is her second ED visit for sickle cell pain. When she was seen on 07/25/2019, the same day as her [facility] discharge, she reported that she had been having worsening pain over the past 2 days.    Additionally care everywhere shows that patient was contacted by [facility] transitional care team today at 1141 and "no issues were identified.  There is no note of her having a current pain crisis then, or up her ED visit the same day she was discharged.  HPI     Past Medical History:  Diagnosis Date  . Asthma   . Eczema   . History of pulmonary embolus (PE)   . Sickle cell anemia [facility])     Patient Active Problem List   Diagnosis Date Noted  .  Drug-seeking behavior 07/07/2019  . Sinus tachycardia 07/07/2019  . Therapeutic opioid-induced constipation (OIC) 07/07/2019  . Breast mass in female 06/29/2019  . Atelectasis of right lung 06/29/2019  . Sickle cell crisis ([place]) 06/27/2019  . Sickle cell pain crisis ([place]) 04/19/2019  . Pneumonia 03/27/2019  . 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 Anterior chest wall pain 11/13/2018  . Epigastric abdominal pain 11/13/2018  . Hematuria 11/13/2018  . Hyperbilirubinemia 11/12/2018  . Long term current use of anticoagulant therapy 11/12/2018  . History of pulmonary embolism 09/26/2018  . Acute pulmonary embolism ([place]) 09/26/2018  . Opioid dependence ([place]) 09/13/2018  . Port-A-Cath in place 09/13/2018  . Narcotic abuse, continuous ([place]) 08/28/2018  . Moderate persistent asthma without complication [id number]  . Anemia 07/03/2018  . Personal history of other venous thrombosis and embolism 07/03/2018  . History of transfusion 07/03/2018  . Gastro-esophageal reflux disease without esophagitis 06/02/2018  . Asthma 05/19/2018  . Anxiety and depression 10/30/2017  . At risk for sepsis 06/13/2017  . Mitral regurgitation 02/01/2014  . Itching 12/13/2013  . Nausea & vomiting 12/13/2013  . 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 Frequent complaints of pain 11/01/2013  . Hypokalemia 09/19/2013  . S/P total hip arthroplasty 05/19/2013  . 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  Lower urinary tract infectious disease 03/02/2013  . Chest pain 11/14/2012  . Sickle-cell anemia ([place]) 10/30/2012  . Pain management 08/01/2012  . Reticulocytosis 07/31/2012  . 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 Leukocytosis 03/27/2012  . History of gestational diabetes 10/07/2011  . 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 CHOLECYSTECTOMY    . ERCP    . JOINT REPLACEMENT    . PORTA CATH INSERTION    . TUBAL LIGATION    . WISDOM TOOTH EXTRACTION       OB History   No obstetric history on file.     Family History  Problem Relation Age of Onset  . Renal Disease Mother   . Hypertension Mother   . High Cholesterol Mother     Social History   Tobacco Use  . Smoking status: Never Smoker  . Smokeless tobacco: Never Used  Substance Use Topics  . Alcohol use: Never  . Drug use: Never    Home Medications Prior to Admission medications   Medication Sig Start Date End Date Taking? Authorizing Provider  albuterol (VENTOLIN HFA) 108 (90 Base) MCG/ACT inhaler Inhale 2 puffs into the lungs 2 (two) times daily as needed for wheezing or shortness of breath. 12/31/16  Yes [provider]  Cholecalciferol (VITAMIN D3) 25 MCG (1000 UT) CAPS Take 1,000 Units by mouth daily.   Yes [provider]  Deferasirox 360 MG TABS Take 1,080 mg by mouth daily. 09/27/16  Yes [provider]  diphenhydrAMINE (BENADRYL) 25 mg capsule Take 25 mg by mouth 3 (three) times daily as needed for itching.   Yes [provider]  folic acid (FOLVITE) 1 MG tablet Take 1 mg by mouth daily. 12/31/16  Yes [provider]  mirtazapine (REMERON) 45 MG tablet Take 45 mg by mouth at bedtime. 09/30/18  Yes [provider]  mometasone-formoterol (DULERA) 100-5 MCG/ACT AERO Inhale 2 puffs into the lungs daily as needed for wheezing or shortness of breath.   Yes [provider]  omeprazole (PRILOSEC) 20 MG capsule Take 20 mg by mouth 2 (two) times a day. 09/08/18  Yes [provider]  oxyCODONE 10 MG TABS Take 1 tablet (10 mg total) by mouth every 4 (four) hours while awake. Patient taking differently: Take 10 mg by mouth every 4 (four) hours as needed (pain). Every 4 hours while awake 07/02/19  Yes [name], FNP  promethazine  (PHENERGAN) 25 MG tablet Take 25 mg by mouth every 8 (eight) hours as needed for nausea/vomiting. 06/22/19  Yes [provider]  vitamin B-12 (CYANOCOBALAMIN) 1000 MCG tablet Take 1,000 mcg by mouth daily.   Yes [provider]  XARELTO 20 MG TABS tablet Take 1 tablet (20 mg total) by mouth daily. 05/14/19  Yes [name], MD  oxyCODONE (OXYCONTIN) 10 mg 12 hr tablet Take 1 tablet (10 mg total) by mouth every 12 (twelve) hours. Patient not taking: Reported on 07/28/2019 07/02/19   [name], FNP    Allergies    Cefaclor and Hydroxyurea  Review of Systems   Review of Systems  Constitutional: Negative for chills and fever.  Respiratory: Negative for cough, chest tightness and shortness of breath.   Cardiovascular: Positive for chest pain.  Gastrointestinal: Positive for nausea and vomiting. Negative for abdominal pain and diarrhea.  Genitourinary: Negative for dysuria and urgency.  Musculoskeletal: Negative for  back pain and neck pain.       Left leg pain  Skin: Negative for color change, rash and wound.  Neurological: Negative for weakness and headaches.  All other systems reviewed and are negative.   Physical Exam Updated Vital Signs BP (!) 142/96   Pulse (!) 102   Temp 99.2 F (37.3 C) (Oral)   Resp 15   Ht 5\' 3"  (1.6 m)   Wt 56.2 kg   LMP 07/18/2019 (Approximate)   SpO2 97%   BMI 21.97 kg/m   Physical Exam Vitals and nursing note reviewed.  Constitutional:      Appearance: Normal appearance. She is well-developed. She is not diaphoretic.  HENT:     Head: Normocephalic and atraumatic.  Eyes:     General: No scleral icterus.       Right eye: No discharge.        Left eye: No discharge.     Conjunctiva/sclera: Conjunctivae normal.  Cardiovascular:     Rate and Rhythm: Normal rate and regular rhythm.     Pulses: Normal pulses.     Comments: 2+ DP/PT pulses bilaterally.  Pulmonary:     Effort: Pulmonary effort is normal. No  respiratory distress.     Breath sounds: No stridor.  Abdominal:     General: There is no distension.  Musculoskeletal:        General: No deformity.     Cervical back: Normal range of motion.     Right lower leg: No edema.     Left lower leg: No edema.  Skin:    General: Skin is warm and dry.  Neurological:     General: No focal deficit present.     Mental Status: She is alert.     Motor: No abnormal muscle tone.  Psychiatric:        Mood and Affect: Mood normal.        Behavior: Behavior normal.     ED Results / Procedures / Treatments   Labs (all labs ordered are listed, but only abnormal results are displayed) Labs Reviewed  COMPREHENSIVE METABOLIC PANEL - Abnormal; Notable for the following components:      Result Value   Potassium 3.2 (*)    BUN 5 (*)    Creatinine, Ser 0.35 (*)    Total Protein 8.2 (*)    Total Bilirubin 4.4 (*)    All other components within normal limits  CBC WITH DIFFERENTIAL/PLATELET - Abnormal; Notable for the following components:   WBC 17.9 (*)    RBC 2.95 (*)    Hemoglobin 8.4 (*)    HCT 25.8 (*)    RDW 18.6 (*)    Platelets 654 (*)    Neutro Abs 12.9 (*)    Monocytes Absolute 1.9 (*)    Abs Immature Granulocytes 0.09 (*)    All other components within normal limits  RETICULOCYTES - Abnormal; Notable for the following components:   Retic Ct Pct 3.8 (*)    RBC. 2.94 (*)    Immature Retic Fract 27.4 (*)    All other components within normal limits  LIPASE, BLOOD  I-STAT BETA HCG BLOOD, ED ([facility], [facility], [facility] ONLY)  TROPONIN I (HIGH SENSITIVITY)  TROPONIN I (HIGH SENSITIVITY)    EKG None  Radiology DG Chest 2 View  Result Date: 07/28/2019 CLINICAL DATA:  Sickle cell crises, mid chest pain, shortness of breath EXAM: CHEST - 2 VIEW COMPARISON:  Chest radiograph 07/25/2019 FINDINGS: Stable cardiomediastinal contours. Unchanged appearance  of bilateral Port-A-Cath. There are chronic bilateral interstitial markings. No new focal pulmonary  opacity. No pneumothorax or pleural effusion. No acute finding in the visualized skeleton. IMPRESSION: No acute cardiopulmonary finding. Electronically Signed   By: Audie Pinto M.D.   On: 07/28/2019 20:50    Procedures Procedures (including critical care time)  Medications Ordered in ED Medications  sodium chloride flush (NS) 0.9 % injection 3 mL (has no administration in time range)  HYDROmorphone (DILAUDID) injection 0.5 mg (has no administration in time range)  HYDROmorphone (DILAUDID) injection 2 mg (has no administration in time range)  potassium chloride SA (KLOR-CON) CR tablet 40 mEq (has no administration in time range)  promethazine (PHENERGAN) injection 12.5 mg (12.5 mg Intravenous Given 07/28/19 2226)  diphenhydrAMINE (BENADRYL) injection 25 mg (25 mg Intravenous Given 07/28/19 2229)  HYDROmorphone (DILAUDID) injection 2 mg (2 mg Intravenous Given 07/28/19 2232)    ED Course  I have reviewed the triage vital signs and the nursing notes.  Pertinent labs & imaging results that were available during my care of the patient were reviewed by me and considered in my medical decision making (see chart for details).  Clinical Course as of Jul 27 2328  Wed Jul 28, 2019  2259 I walked by, patient seen through her room door resting comfortably texting on her phone in no obvious distress.   [EH]    Clinical Course User Index [EH] Ollen Gross   MDM Rules/Calculators/A&P                     Nona Locker is a 39 year old woman who presents today for a sickle cell pain crisis. Her pain is in her chest and her left leg which she reports is consistent with her usual sickle cell pain.  She is not concerned about possibility of acute chest stating that this feels like her normal flare. Labs are obtained and reviewed, white count is elevated at 17.9 which appears to be consistent with her baseline.  Hemoglobin is 8.4, again consistent with her baseline.  She is mildly  hypokalemic with a potassium of 3.2, p.o. potassium replacement was ordered. Pregnancy test is negative. Given her reported chest pain x-ray was obtained without evidence of consolidation pneumothorax or other acute process. Troponin is not elevated.  Pain medication ordered.   At shift change care was transferred to Quincy Carnes PA-C who will follow pending studies, re-evaulate and determine disposition.     Final Clinical Impression(s) / ED Diagnoses Final diagnoses:  Sickle cell pain crisis [facility])    Rx / [place] Orders ED Discharge Orders    None       Ollen Gross 07/28/19 2359    Milton Ferguson, MD 07/30/19 [phone]

## 2019-07-28 NOTE — ED Triage Notes (Signed)
Patient complaining of nausea, vomiting, mid chest pain, and sickle cell pain. Patient states this started Sunday and her home medication is not working

## 2019-07-29 DIAGNOSIS — D638 Anemia in other chronic diseases classified elsewhere: Secondary | ICD-10-CM | POA: Diagnosis not present

## 2019-07-29 DIAGNOSIS — Z7901 Long term (current) use of anticoagulants: Secondary | ICD-10-CM | POA: Diagnosis not present

## 2019-07-29 DIAGNOSIS — Z8249 Family history of ischemic heart disease and other diseases of the circulatory system: Secondary | ICD-10-CM | POA: Diagnosis not present

## 2019-07-29 DIAGNOSIS — D5701 Hb-SS disease with acute chest syndrome: Secondary | ICD-10-CM | POA: Diagnosis present

## 2019-07-29 DIAGNOSIS — Z86711 Personal history of pulmonary embolism: Secondary | ICD-10-CM | POA: Diagnosis not present

## 2019-07-29 DIAGNOSIS — J4520 Mild intermittent asthma, uncomplicated: Secondary | ICD-10-CM | POA: Diagnosis not present

## 2019-07-29 DIAGNOSIS — Z841 Family history of disorders of kidney and ureter: Secondary | ICD-10-CM | POA: Diagnosis not present

## 2019-07-29 DIAGNOSIS — R112 Nausea with vomiting, unspecified: Secondary | ICD-10-CM | POA: Diagnosis not present

## 2019-07-29 DIAGNOSIS — Z8349 Family history of other endocrine, nutritional and metabolic diseases: Secondary | ICD-10-CM | POA: Diagnosis not present

## 2019-07-29 DIAGNOSIS — G894 Chronic pain syndrome: Secondary | ICD-10-CM | POA: Diagnosis not present

## 2019-07-29 DIAGNOSIS — Z20822 Contact with and (suspected) exposure to covid-19: Secondary | ICD-10-CM | POA: Diagnosis not present

## 2019-07-29 DIAGNOSIS — E876 Hypokalemia: Secondary | ICD-10-CM | POA: Diagnosis not present

## 2019-07-29 DIAGNOSIS — D72829 Elevated white blood cell count, unspecified: Secondary | ICD-10-CM | POA: Diagnosis not present

## 2019-07-29 DIAGNOSIS — Z881 Allergy status to other antibiotic agents status: Secondary | ICD-10-CM | POA: Diagnosis not present

## 2019-07-29 LAB — SARS CORONAVIRUS 2 (TAT 6-24 HRS): SARS Coronavirus 2: NEGATIVE

## 2019-07-29 LAB — CREATININE, SERUM
Creatinine, Ser: 0.35 mg/dL — ABNORMAL LOW (ref 0.44–1.00)
GFR calc Af Amer: >60 mL/min (ref >60)
GFR calc non Af Amer: >60 mL/min (ref >60)

## 2019-07-29 LAB — TROPONIN I (HIGH SENSITIVITY): Troponin I (High Sensitivity): <2 ng/L (ref <18)

## 2019-07-29 LAB — CBC
HCT: 25.4 % — ABNORMAL LOW (ref 36.0–46.0)
Hemoglobin: 8.2 g/dL — ABNORMAL LOW (ref 12.0–15.0)
MCH: 28.5 pg (ref 26.0–34.0)
MCHC: 32.3 g/dL (ref 30.0–36.0)
MCV: 88.2 fL (ref 80.0–100.0)
Platelets: 598 10*3/uL — ABNORMAL HIGH (ref 150–400)
RBC: 2.88 MIL/uL — ABNORMAL LOW (ref 3.87–5.11)
RDW: 18.6 % — ABNORMAL HIGH (ref 11.5–15.5)
WBC: 18.8 10*3/uL — ABNORMAL HIGH (ref 4.0–10.5)
nRBC: 0.2 % (ref 0.0–0.2)

## 2019-07-29 MED ORDER — OXYCODONE HCL ER 10 MG PO T12A
10.0000 mg | EXTENDED_RELEASE_TABLET | Freq: Two times a day (BID) | ORAL | Status: DC
  Administered 2019-07-29 – 2019-08-04 (×13): 10 mg via ORAL
  Filled 2019-07-29 (×13): qty 1

## 2019-07-29 MED ORDER — DIPHENHYDRAMINE HCL 25 MG PO CAPS
25.0000 mg | ORAL_CAPSULE | Freq: Four times a day (QID) | ORAL | Status: AC | PRN
  Administered 2019-08-01 (×2): 25 mg via ORAL
  Filled 2019-07-29 (×3): qty 1

## 2019-07-29 MED ORDER — HYDROMORPHONE 1 MG/ML IV SOLN
INTRAVENOUS | Status: DC
  Administered 2019-07-29: 2 mg via INTRAVENOUS
  Administered 2019-07-29: 30 mg via INTRAVENOUS
  Administered 2019-07-29: 2 mg via INTRAVENOUS
  Administered 2019-07-30: 2.5 mg via INTRAVENOUS
  Administered 2019-07-30: 4.5 mg via INTRAVENOUS
  Administered 2019-07-30: 3.5 mg via INTRAVENOUS
  Administered 2019-07-30: 30 mg via INTRAVENOUS
  Administered 2019-07-30: 2.5 mg via INTRAVENOUS
  Administered 2019-07-30: 6 mg via INTRAVENOUS
  Administered 2019-07-30: 3.4 mg via INTRAVENOUS
  Administered 2019-07-31 (×2): 4.5 mg via INTRAVENOUS
  Administered 2019-07-31: 5 mg via INTRAVENOUS
  Administered 2019-07-31 (×2): 4.5 mg via INTRAVENOUS
  Administered 2019-07-31: 2.5 mg via INTRAVENOUS
  Administered 2019-07-31: 30 mg via INTRAVENOUS
  Administered 2019-08-01: 5 mg via INTRAVENOUS
  Administered 2019-08-01: 4 mg via INTRAVENOUS
  Administered 2019-08-01: 4.5 mg via INTRAVENOUS
  Administered 2019-08-01: 4 mg via INTRAVENOUS
  Administered 2019-08-01: 30 mg via INTRAVENOUS
  Administered 2019-08-01: 6 mg via INTRAVENOUS
  Administered 2019-08-01: 5 mg via INTRAVENOUS
  Administered 2019-08-02: 6 mg via INTRAVENOUS
  Administered 2019-08-02: 4.5 mg via INTRAVENOUS
  Administered 2019-08-02: 4 mg via INTRAVENOUS
  Filled 2019-07-29 (×4): qty 30

## 2019-07-29 MED ORDER — HYDROMORPHONE HCL 2 MG/ML IJ SOLN
2.0000 mg | Freq: Once | INTRAMUSCULAR | Status: AC
  Administered 2019-07-29: 2 mg via INTRAVENOUS
  Filled 2019-07-29: qty 1

## 2019-07-29 MED ORDER — DIPHENHYDRAMINE HCL 25 MG PO CAPS
25.0000 mg | ORAL_CAPSULE | Freq: Once | ORAL | Status: AC
  Administered 2019-07-29: 25 mg via ORAL
  Filled 2019-07-29: qty 1

## 2019-07-29 MED ORDER — DEXTROSE-NACL 5-0.45 % IV SOLN: INTRAVENOUS | Status: DC

## 2019-07-29 MED ORDER — CHLORHEXIDINE GLUCONATE CLOTH 2 % EX PADS
6.0000 | MEDICATED_PAD | Freq: Every day | CUTANEOUS | Status: DC
  Administered 2019-07-29 – 2019-08-04 (×5): 6 via TOPICAL

## 2019-07-29 MED ORDER — ENOXAPARIN SODIUM 40 MG/0.4ML ~~LOC~~ SOLN: 40.0000 mg | SUBCUTANEOUS | Status: DC

## 2019-07-29 MED ORDER — HYDROMORPHONE HCL 1 MG/ML IJ SOLN
1.0000 mg | INTRAMUSCULAR | Status: DC | PRN
  Administered 2019-07-29 (×3): 1 mg via INTRAVENOUS
  Filled 2019-07-29 (×3): qty 1

## 2019-07-29 MED ORDER — PROMETHAZINE HCL 12.5 MG RE SUPP
12.5000 mg | RECTAL | Status: DC | PRN
  Filled 2019-07-29: qty 2

## 2019-07-29 MED ORDER — POLYETHYLENE GLYCOL 3350 17 G PO PACK: 17.0000 g | PACK | Freq: Every day | ORAL | Status: DC | PRN

## 2019-07-29 MED ORDER — KETOROLAC TROMETHAMINE 15 MG/ML IJ SOLN
15.0000 mg | Freq: Four times a day (QID) | INTRAMUSCULAR | Status: AC
  Administered 2019-07-29 – 2019-08-02 (×20): 15 mg via INTRAVENOUS
  Filled 2019-07-29 (×20): qty 1

## 2019-07-29 MED ORDER — SODIUM CHLORIDE 0.9 % IV SOLN
25.0000 mg | Freq: Once | INTRAVENOUS | Status: AC
  Administered 2019-07-29: 25 mg via INTRAVENOUS
  Filled 2019-07-29: qty 25

## 2019-07-29 MED ORDER — PROMETHAZINE HCL 25 MG PO TABS
12.5000 mg | ORAL_TABLET | ORAL | Status: DC | PRN
  Filled 2019-07-29: qty 1

## 2019-07-29 MED ORDER — NALOXONE HCL 0.4 MG/ML IJ SOLN: 0.4000 mg | INTRAMUSCULAR | Status: DC | PRN

## 2019-07-29 MED ORDER — SENNOSIDES-DOCUSATE SODIUM 8.6-50 MG PO TABS
1.0000 | ORAL_TABLET | Freq: Two times a day (BID) | ORAL | Status: DC
  Administered 2019-07-29 – 2019-08-04 (×14): 1 via ORAL
  Filled 2019-07-29 (×14): qty 1

## 2019-07-29 MED ORDER — RIVAROXABAN 20 MG PO TABS
20.0000 mg | ORAL_TABLET | Freq: Every day | ORAL | Status: DC
  Administered 2019-07-29 – 2019-08-04 (×7): 20 mg via ORAL
  Filled 2019-07-29 (×7): qty 1

## 2019-07-29 MED ORDER — SODIUM CHLORIDE 0.9% FLUSH: 9.0000 mL | INTRAVENOUS | Status: DC | PRN

## 2019-07-29 NOTE — Progress Notes (Signed)
Regular diet place per Ms. [place] Hollis NP

## 2019-07-29 NOTE — ED Provider Notes (Signed)
Assumed care from [place] at shift change.  See prior notes for full H&P.  Briefly, 39 y.o. F here with sickle cell pain crisis.  Just admitted to [facility] for same.  Work-up here largely reassuring.  Plan:  Pain control, reassess.  Results for orders placed or performed during the hospital encounter of 07/28/19  Comprehensive metabolic panel  Result Value Ref Range   Sodium 139 135 - 145 mmol/L   Potassium 3.2 (L) 3.5 - 5.1 mmol/L   Chloride 107 98 - 111 mmol/L   CO2 24 22 - 32 mmol/L   Glucose, Bld 99 70 - 99 mg/dL   BUN 5 (L) 6 - 20 mg/dL   Creatinine, Ser 0.35 (L) 0.44 - 1.00 mg/dL   Calcium 9.1 8.9 - 10.3 mg/dL   Total Protein 8.2 (H) 6.5 - 8.1 g/dL   Albumin 4.5 3.5 - 5.0 g/dL   AST 22 15 - 41 U/L   ALT 21 0 - 44 U/L   Alkaline Phosphatase 62 38 - 126 U/L   Total Bilirubin 4.4 (H) 0.3 - 1.2 mg/dL   GFR calc non Af Amer >60 >60 mL/min   GFR calc Af Amer >60 >60 mL/min   Anion gap 8 5 - 15  CBC with Differential  Result Value Ref Range   WBC 17.9 (H) 4.0 - 10.5 K/uL   RBC 2.95 (L) 3.87 - 5.11 MIL/uL   Hemoglobin 8.4 (L) 12.0 - 15.0 g/dL   HCT 25.8 (L) 36.0 - 46.0 %   MCV 87.5 80.0 - 100.0 fL   MCH 28.5 26.0 - 34.0 pg   MCHC 32.6 30.0 - 36.0 g/dL   RDW 18.6 (H) 11.5 - 15.5 %   Platelets 654 (H) 150 - 400 K/uL   nRBC 0.1 0.0 - 0.2 %   Neutrophils Relative % 70 %   Neutro Abs 12.9 (H) 1.7 - 7.7 K/uL   Lymphocytes Relative 16 %   Lymphs Abs 2.9 0.7 - 4.0 K/uL   Monocytes Relative 11 %   Monocytes Absolute 1.9 (H) 0.1 - 1.0 K/uL   Eosinophils Relative 1 %   Eosinophils Absolute 0.1 0.0 - 0.5 K/uL   Basophils Relative 1 %   Basophils Absolute 0.1 0.0 - 0.1 K/uL   Immature Granulocytes 1 %   Abs Immature Granulocytes 0.09 (H) 0.00 - 0.07 K/uL   Polychromasia PRESENT    Sickle Cells PRESENT    Target Cells PRESENT   Reticulocytes  Result Value Ref Range   Retic Ct Pct 3.8 (H) 0.4 - 3.1 %   RBC. 2.94 (L) 3.87 - 5.11 MIL/uL   Retic Count, Absolute 110.8 19.0 -  186.0 K/uL   Immature Retic Fract 27.4 (H) 2.3 - 15.9 %  Lipase, blood  Result Value Ref Range   Lipase 29 11 - 51 U/L  I-Stat beta hCG blood, ED  Result Value Ref Range   I-stat hCG, quantitative <5.0 <5 mIU/mL   Comment 3          Troponin I (High Sensitivity)  Result Value Ref Range   Troponin I (High Sensitivity) <2.00 <18 ng/L   DG Chest 2 View  Result Date: 07/28/2019 CLINICAL DATA:  Sickle cell crises, mid chest pain, shortness of breath EXAM: CHEST - 2 VIEW COMPARISON:  Chest radiograph 07/25/2019 FINDINGS: Stable cardiomediastinal contours. Unchanged appearance of bilateral Port-A-Cath. There are chronic bilateral interstitial markings. No new focal pulmonary opacity. No pneumothorax or pleural effusion. No acute finding in the  visualized skeleton. IMPRESSION: No acute cardiopulmonary finding. Electronically Signed   By: Audie Pinto M.D.   On: 07/28/2019 20:50   DG Chest 2 View  Result Date: 07/25/2019 CLINICAL DATA:  Chest pain, sickle cell EXAM: CHEST - 2 VIEW COMPARISON:  07/12/2018 FINDINGS: The heart size and mediastinal contours are within normal limits. Left and right chest port catheters. Both lungs are clear. The visualized skeletal structures are unremarkable. IMPRESSION: No acute abnormality of the lungs. Electronically Signed   By: Eddie Candle M.D.   On: 07/25/2019 18:52   DG Chest 2 View  Result Date: 07/13/2019 CLINICAL DATA:  Chest pain beginning 2 days ago with progression. Sickle cell disease. EXAM: CHEST - 2 VIEW COMPARISON:  One-view chest x-ray 07/07/2019. FINDINGS: The heart size is normal. Bilateral Port-A-Cath is are again noted. Chronic interstitial changes are present. There is no edema or effusion. No focal airspace disease is present. Atherosclerotic calcifications are present at the gallbladder fossa. IMPRESSION: No acute cardiopulmonary disease or significant interval change. Electronically Signed   By: San Morelle M.D.   On: 07/13/2019  12:29   DG Chest 2 View  Result Date: 07/07/2019 CLINICAL DATA:  Shortness of breath. Sickle cell crisis since yesterday. EXAM: CHEST - 2 VIEW COMPARISON:  06/26/2019 FINDINGS: Bilateral IJ central venous catheters are unchanged. Lungs are adequately inflated with subtle prominence of the central pulmonary vessels likely minimal vascular congestion. No lobar consolidation or effusion. No pneumothorax. Mild stable cardiomegaly. Remainder of the exam is unchanged. IMPRESSION: Mild stable cardiomegaly with suggestion of minimal vascular congestion. Electronically Signed   By: Marin Olp M.D.   On: 07/07/2019 16:01   CT Angio Chest PE W and/or Wo Contrast  Result Date: 07/07/2019 CLINICAL DATA:  Shortness of breath and chest pain. EXAM: CT ANGIOGRAPHY CHEST WITH CONTRAST TECHNIQUE: Multidetector CT imaging of the chest was performed using the standard protocol during bolus administration of intravenous contrast. Multiplanar CT image reconstructions and MIPs were obtained to evaluate the vascular anatomy. CONTRAST:  103mL OMNIPAQUE IOHEXOL 350 MG/ML SOLN COMPARISON:  April 19, 2019 FINDINGS: Cardiovascular: Bilateral venous Port-A-Cath so seen. Satisfactory opacification of the pulmonary arteries to the segmental level. No evidence of pulmonary embolism. Normal heart size. No pericardial effusion. Mediastinum/Nodes: No enlarged mediastinal, hilar, or axillary lymph nodes. Thyroid gland, trachea, and esophagus demonstrate no significant findings. Lungs/Pleura: Mild atelectasis is seen within the posterior aspect of the bilateral lung bases. There is no evidence of a pleural effusion or pneumothorax. Upper Abdomen: Multiple surgical clips are seen within the gallbladder fossa. The spleen is small and heterogeneous in appearance. Musculoskeletal: A predominant stable 3.5 cm x 2.1 cm soft tissue mass is seen adjacent to the T9 vertebral body on the right. A stable 2.4 cm x 1.9 cm partially calcified soft tissue  mass is seen within the right breast (axial CT image 61, CT series number 6). The osseous structures are diffusely moderate in appearance. Review of the MIP images confirms the above findings. IMPRESSION: 1. No evidence of pulmonary embolism. 2. Stable appearing soft tissue mass adjacent to the T9 vertebral body on the right. 3. Stable 2.4 cm x 1.9 cm partially calcified soft tissue mass within the right breast. 4. Moderate appearing osseous structures consistent with patient's known history of sickle cell disease. Electronically Signed   By: Virgina Norfolk M.D.   On: 07/07/2019 22:47    After 3 rounds, states pain without any significant improvement.  Rates 8/10 and was 10/10 when arrived.  States she cannot go home like this.  Will consult for admission.  COVID screen ordered.  Discussed with hospitalist, Dr. Humphrey Rolls-- will admit for ongoing care.   Larene Pickett, PA-C 07/29/19 0251    Orpah Greek, MD 07/29/19 [phone]

## 2019-07-29 NOTE — H&P (Signed)
History and Physical    [facility] JN:[id number] DOB: February 17, 1981 DOA: 07/28/2019  PCP: Rosana Berger, FNP (Confirm with patient/family/NH records and if not entered, this has to be entered at [facility] point of entry) Patient coming from: Home  I have personally briefly reviewed patient's old medical records in [place]  Chief Complaint: Chest pain  HPI: Molly Gutierrez is a 39 y.o. female with medical history significant of sickle cell anemia, pulmonary embolism on Xarelto, asthma and reported drug-seeking behavior presented to ED for evaluation of sickle cell pain crisis.  Patient states that the pain started in her chest about 3 days ago and continue to worsen and now he is also having pain in her left leg.  Patient further mentioned that she had similar episodes of pain in the past when she was having sickle cell crisis.  Pain is sharp and sometimes colicky, located in the entire chest, 8/10 on pain scale when it gets worse, did not get better with home opioid medications and get worse with nothing.  Patient also admits of having nausea and an episode of vomiting.  Patient denies any fever, chills, shortness of breath, diaphoresis, palpitations, abdominal pain, constipation, diarrhea and urinary symptoms.  According to the ED physician patient was recently managed in [place] for sickle cell crisis and stayed in the hospital for 10 days and he was discharged 3 days ago and this is her second ED visit for sickle cell pain.  ED Course: In the ED on arrival patient had 99.6, blood pressure 115/78, heart rate 95, respiratory rate 14 and oxygen saturation 98% on room air.  Blood work showed WBC 17.9 which is at baseline, potassium 3.2.  Troponin negative.  Chest x-ray negative for acute cardiopulmonary problem.  EKG showed sinus rhythm with nonspecific T wave abnormalities in anterior leads.  Patient was managed with multiple doses of IV Dilaudid and promethazine and potassium  supplementation in the ED.  Review of Systems: As per HPI otherwise 10 point review of systems negative.    Past Medical History:  Diagnosis Date  . Asthma   . Eczema   . History of pulmonary embolus (PE)   . Sickle cell anemia (HCC)     Past Surgical History:  Procedure Laterality Date  . CHOLECYSTECTOMY    . ERCP    . JOINT REPLACEMENT    . PORTA CATH INSERTION    . TUBAL LIGATION    . WISDOM TOOTH EXTRACTION       reports that she has never smoked. She has never used smokeless tobacco. She reports that she does not drink alcohol or use drugs.  Allergies  Allergen Reactions  . Cefaclor Hives and Swelling  . Hydroxyurea Other (See Comments) and Palpitations    Lower blood levels and HR Other reaction(s): Hypotension, Other (See Comments) "it messes me up, it drops my levels and stuff" Lower blood levels and HR "it messes me up, it drops my levels and stuff"     Family History  Problem Relation Age of Onset  . Renal Disease Mother   . Hypertension Mother   . High Cholesterol Mother      Prior to Admission medications   Medication Sig Start Date End Date Taking? Authorizing Provider  albuterol (VENTOLIN HFA) 108 (90 Base) MCG/ACT inhaler Inhale 2 puffs into the lungs 2 (two) times daily as needed for wheezing or shortness of breath. 12/31/16  Yes [provider]  Cholecalciferol (VITAMIN D3) 25 MCG (1000 UT) CAPS  Take 1,000 Units by mouth daily.   Yes [provider]  Deferasirox 360 MG TABS Take 1,080 mg by mouth daily. 09/27/16  Yes [provider]  diphenhydrAMINE (BENADRYL) 25 mg capsule Take 25 mg by mouth 3 (three) times daily as needed for itching.   Yes [provider]  folic acid (FOLVITE) 1 MG tablet Take 1 mg by mouth daily. 12/31/16  Yes [provider]  mirtazapine (REMERON) 45 MG tablet Take 45 mg by mouth at bedtime. 09/30/18  Yes [provider]  mometasone-formoterol (DULERA) 100-5 MCG/ACT AERO  Inhale 2 puffs into the lungs daily as needed for wheezing or shortness of breath.   Yes [provider]  omeprazole (PRILOSEC) 20 MG capsule Take 20 mg by mouth 2 (two) times a day. 09/08/18  Yes [provider]  oxyCODONE 10 MG TABS Take 1 tablet (10 mg total) by mouth every 4 (four) hours while awake. Patient taking differently: Take 10 mg by mouth every 4 (four) hours as needed (pain). Every 4 hours while awake 07/02/19  Yes [name], FNP  promethazine (PHENERGAN) 25 MG tablet Take 25 mg by mouth every 8 (eight) hours as needed for nausea/vomiting. 06/22/19  Yes [provider]  vitamin B-12 (CYANOCOBALAMIN) 1000 MCG tablet Take 1,000 mcg by mouth daily.   Yes [provider]  XARELTO 20 MG TABS tablet Take 1 tablet (20 mg total) by mouth daily. 05/14/19  Yes [name], MD  oxyCODONE (OXYCONTIN) 10 mg 12 hr tablet Take 1 tablet (10 mg total) by mouth every 12 (twelve) hours. Patient not taking: Reported on 07/28/2019 07/02/19   [name], FNP    Physical Exam: Vitals:   07/29/19 0306 07/29/19 0330 07/29/19 0411 07/29/19 0431  BP: 108/80 106/79 122/85 115/78  Pulse: 84 78  95  Resp: 20 16  14   Temp:    99.6 F (37.6 C)  TempSrc:    Oral  SpO2: 100% 94%  98%  Weight:      Height:        Constitutional: NAD, calm, comfortable Vitals:   07/29/19 0306 07/29/19 0330 07/29/19 0411 07/29/19 0431  BP: 108/80 106/79 122/85 115/78  Pulse: 84 78  95  Resp: 20 16  14   Temp:    99.6 F (37.6 C)  TempSrc:    Oral  SpO2: 100% 94%  98%  Weight:      Height:       Eyes: PERRL, lids and conjunctivae normal ENMT: Mucous membranes are moist. Posterior pharynx clear of any exudate or lesions.Normal dentition.  Neck: normal, supple, no masses, no thyromegaly Respiratory: clear to auscultation bilaterally, no wheezing, no crackles. Normal respiratory effort. No accessory muscle use.  Cardiovascular: Chest is nontender on palpation.   Regular rate and rhythm, no murmurs / rubs / gallops. No extremity edema. 2+ pedal pulses. No carotid bruits.  Abdomen: no tenderness, no masses palpated. No hepatosplenomegaly. Bowel sounds positive.  Musculoskeletal: no clubbing / cyanosis. No joint deformity upper and lower extremities. Good ROM, no contractures. Normal muscle tone.  Skin: no rashes, lesions, ulcers. No induration Neurologic: CN 2-12 grossly intact. Sensation intact, DTR normal. Strength 5/5 in all 4.  Psychiatric: Normal judgment and insight. Alert and oriented x 3. Normal mood.   Labs on Admission: I have personally reviewed following labs and imaging studies  CBC: Recent Labs  Lab 07/25/19 2000 07/28/19 2149 07/29/19 0308  WBC 15.5* 17.9* 18.8*  NEUTROABS 12.3* 12.9*  --  HGB 8.8* 8.4* 8.2*  HCT 27.0* 25.8* 25.4*  MCV 86.8 87.5 88.2  PLT 677* 654* [id number]*   Basic Metabolic Panel: Recent Labs  Lab 07/25/19 2000 07/28/19 2149 07/29/19 0308  NA 136 139  --   K 3.0* 3.2*  --   CL 102 107  --   CO2 21* 24  --   GLUCOSE 124* 99  --   BUN 6 5*  --   CREATININE 0.46 0.35* 0.35*  CALCIUM 8.8* 9.1  --    GFR: Estimated Creatinine Clearance: 78.9 mL/min (A) (by C-G formula based on SCr of 0.35 mg/dL (L)). Liver Function Tests: Recent Labs  Lab 07/25/19 2000 07/28/19 2149  AST 30 22  ALT 22 21  ALKPHOS 57 62  BILITOT 3.5* 4.4*  PROT 8.3* 8.2*  ALBUMIN 4.6 4.5   Recent Labs  Lab 07/28/19 2149  LIPASE 29   No results for input(s): AMMONIA in the last 168 hours. Coagulation Profile: No results for input(s): INR, PROTIME in the last 168 hours. Cardiac Enzymes: No results for input(s): CKTOTAL, CKMB, CKMBINDEX, TROPONINI in the last 168 hours. BNP (last 3 results) No results for input(s): PROBNP in the last 8760 hours. HbA1C: No results for input(s): HGBA1C in the last 72 hours. CBG: No results for input(s): GLUCAP in the last 168 hours. Lipid Profile: No results for input(s): CHOL, HDL,  LDLCALC, TRIG, CHOLHDL, LDLDIRECT in the last 72 hours. Thyroid Function Tests: No results for input(s): TSH, T4TOTAL, FREET4, T3FREE, THYROIDAB in the last 72 hours. Anemia Panel: Recent Labs    07/28/19 2149  RETICCTPCT 3.8*   Urine analysis:    Component Value Date/Time   COLORURINE YELLOW 07/13/2019 1418   APPEARANCEUR CLEAR 07/13/2019 1418   LABSPEC 1.011 07/13/2019 1418   PHURINE 6.0 07/13/2019 1418   GLUCOSEU NEGATIVE 07/13/2019 1418   HGBUR NEGATIVE 07/13/2019 1418   [place] 07/13/2019 1418   BILIRUBINUR small 07/09/2019 1440   KETONESUR NEGATIVE 07/13/2019 1418   PROTEINUR NEGATIVE 07/13/2019 1418   UROBILINOGEN 1.0 07/09/2019 1440   NITRITE NEGATIVE 07/13/2019 [place] 07/13/2019 1418    Radiological Exams on Admission: DG Chest 2 View  Result Date: 07/28/2019 CLINICAL DATA:  Sickle cell crises, mid chest pain, shortness of breath EXAM: CHEST - 2 VIEW COMPARISON:  Chest radiograph 07/25/2019 FINDINGS: Stable cardiomediastinal contours. Unchanged appearance of bilateral Port-A-Cath. There are chronic bilateral interstitial markings. No new focal pulmonary opacity. No pneumothorax or pleural effusion. No acute finding in the visualized skeleton. IMPRESSION: No acute cardiopulmonary finding. Electronically Signed   By: Audie Pinto M.D.   On: 07/28/2019 20:50    EKG: Independently reviewed.  EKG showed sinus rhythm with nonspecific T wave abnormalities in anterior leads.  Assessment/Plan Principal Problem:    Sickle cell crisis acute chest syndrome [facility])  Patient is admitted for further evaluation. Sickle cell medicine consult ordered Pain medications as needed ordered Gentle hydration with IV D5 half NS at the rate of 75 mL/h ordered Troponin negative  Active Problems:   Leukocytosis Patient has chronic history of leukocytosis and WBC count is at baseline.  No signs of any infection at this time.    History of pulmonary  embolism Patient is on Xarelto.  Continue home Xarelto    Hypokalemia Potassium repletion done in the ED.  Continue to monitor potassium level and repleted as needed.    DVT prophylaxis: Continue Xarelto 20 mg daily   Code Status: Full code   Disposition  Plan: Plan is to discharge the patient home after he remains stable and evaluated by sickle cell medicine team.  Consults called: Sickle cell medicine  Admission status: Observation/MedSurg   Edmonia Lynch MD [facility] Pager [phone]   If 7PM-7AM, please contact night-coverage [email or web address] Password   07/29/2019, 5:27 AM

## 2019-07-29 NOTE — ED Notes (Signed)
PT requesting pain medication and something for itching. PA made aware

## 2019-07-29 NOTE — Progress Notes (Signed)
[address], a 39 year old female with a medical history significant for sickle cell disease type SS, chronic pain syndrome, opiate dependence and tolerance, history of PE on Xarelto, and history of anemia of chronic disease was admitted for sickle cell pain crisis.  Patient is complaining of mid chest and low back pain that is consistent with her typical pain crisis. She says that she was admitted at [facility] for a total of 10 days for this problem. Patient says that she was not improved prior to discharge.   Pain intensity is 9/10 characterized as constant and sharp.    Care plan: Initiate IV dilaudid PCA per weight based protocol. Settings of 0.5 mg, 10 minute lockout and 3 mg per hour Oxycontin 10 mg every 12 hours Decrease IV fluids.   Will follow throughout admission.    Molly Pounds  APRN, MSN, FNP-C [facility] [place] [address] [place], [place] [id number] [phone]

## 2019-07-30 DIAGNOSIS — D5701 Hb-SS disease with acute chest syndrome: Secondary | ICD-10-CM | POA: Diagnosis not present

## 2019-07-30 LAB — BASIC METABOLIC PANEL
Anion gap: 8 (ref 5–15)
BUN: 7 mg/dL (ref 6–20)
CO2: 25 mmol/L (ref 22–32)
Calcium: 8.7 mg/dL — ABNORMAL LOW (ref 8.9–10.3)
Chloride: 104 mmol/L (ref 98–111)
Creatinine, Ser: 0.46 mg/dL (ref 0.44–1.00)
GFR calc Af Amer: >60 mL/min (ref >60)
GFR calc non Af Amer: >60 mL/min (ref >60)
Glucose, Bld: 98 mg/dL (ref 70–99)
Potassium: 3.7 mmol/L (ref 3.5–5.1)
Sodium: 137 mmol/L (ref 135–145)

## 2019-07-30 LAB — CBC
HCT: 23.2 % — ABNORMAL LOW (ref 36.0–46.0)
Hemoglobin: 7.3 g/dL — ABNORMAL LOW (ref 12.0–15.0)
MCH: 28.2 pg (ref 26.0–34.0)
MCHC: 31.5 g/dL (ref 30.0–36.0)
MCV: 89.6 fL (ref 80.0–100.0)
Platelets: 634 10*3/uL — ABNORMAL HIGH (ref 150–400)
RBC: 2.59 MIL/uL — ABNORMAL LOW (ref 3.87–5.11)
RDW: 19.1 % — ABNORMAL HIGH (ref 11.5–15.5)
WBC: 15.6 10*3/uL — ABNORMAL HIGH (ref 4.0–10.5)
nRBC: 0.1 % (ref 0.0–0.2)

## 2019-07-30 NOTE — Progress Notes (Addendum)
Subjective: Molly Gutierrez, a 39 year old female with a medical history significant for sickle cell disease type SS, chronic pain syndrome, opiate dependence and tolerance, history of PE on Xarelto, and history of anemia of chronic disease was admitted for sickle cell pain crisis.  Patient states that pain improved minimally overnight.  Pain is localized to mid chest and lower back.  Pain intensity is 7/10.  Patient denies headache, shortness of breath, dizziness, paresthesias, urinary symptoms, nausea, vomiting, or diarrhea.  Objective:  Vital signs in last 24 hours:  Vitals:   07/30/19 0819 07/30/19 0951 07/30/19 1211 07/30/19 1259  BP:  91/70  (!) 94/58  Pulse:  72  71  Resp: 15 18 12 16   Temp:  98.3 F (36.8 C)  98.4 F (36.9 C)  TempSrc:  Oral  Oral  SpO2: 95% 95% 96% 97%  Weight:      Height:        Intake/Output from previous day:  No intake or output data in the 24 hours ending 07/30/19 1301  Physical Exam: General: Alert, awake, oriented x3, in no acute distress.  HEENT: [place]/AT PEERL, EOMI Neck: Trachea midline,  no masses, no thyromegal,y no JVD, no carotid bruit OROPHARYNX:  Moist, No exudate/ erythema/lesions.  Heart: Regular rate and rhythm, without murmurs, rubs, gallops, PMI non-displaced, no heaves or thrills on palpation.  Lungs: Clear to auscultation, no wheezing or rhonchi noted. No increased vocal fremitus resonant to percussion  Abdomen: Soft, nontender, nondistended, positive bowel sounds, no masses no hepatosplenomegaly noted..  Neuro: No focal neurological deficits noted cranial nerves II through XII grossly intact. DTRs 2+ bilaterally upper and lower extremities. Strength 5 out of 5 in bilateral upper and lower extremities. Musculoskeletal: No warm swelling or erythema around joints, no spinal tenderness noted. Psychiatric: Patient alert and oriented x3, good insight and cognition, good recent to remote recall. Lymph node survey: No cervical  axillary or inguinal lymphadenopathy noted.  Lab Results:  Basic Metabolic Panel:    Component Value Date/Time   NA 137 07/30/2019 0746   K 3.7 07/30/2019 0746   CL 104 07/30/2019 0746   CO2 25 07/30/2019 0746   BUN 7 07/30/2019 0746   CREATININE 0.46 07/30/2019 0746   GLUCOSE 98 07/30/2019 0746   CALCIUM 8.7 (L) 07/30/2019 0746   CBC:    Component Value Date/Time   WBC 15.6 (H) 07/30/2019 0746   HGB 7.3 (L) 07/30/2019 0746   HCT 23.2 (L) 07/30/2019 0746   PLT 634 (H) 07/30/2019 0746   MCV 89.6 07/30/2019 0746   NEUTROABS 12.9 (H) 07/28/2019 2149   LYMPHSABS 2.9 07/28/2019 2149   MONOABS 1.9 (H) 07/28/2019 2149   EOSABS 0.1 07/28/2019 2149   BASOSABS 0.1 07/28/2019 2149    Recent Results (from the past 240 hour(s))  SARS CORONAVIRUS 2 (TAT 6-24 HRS) Nasopharyngeal Nasopharyngeal Swab     Status: None   Collection Time: 07/29/19  2:30 AM   Specimen: Nasopharyngeal Swab  Result Value Ref Range Status   SARS Coronavirus 2 NEGATIVE NEGATIVE Final    Comment: (NOTE) SARS-CoV-2 target nucleic acids are NOT DETECTED. The SARS-CoV-2 RNA is generally detectable in upper and lower respiratory specimens during the acute phase of infection. Negative results do not preclude SARS-CoV-2 infection, do not rule out co-infections with other pathogens, and should not be used as the sole basis for treatment or other patient management decisions. Negative results must be combined with clinical observations, patient history, and epidemiological information. The expected result is Negative.  Fact Sheet for Patients: SugarRoll.be Fact Sheet for Healthcare Providers: https://www.woods-mathews.com/ This test is not yet approved or cleared by the [place] FDA and  has been authorized for detection and/or diagnosis of SARS-CoV-2 by FDA under an Emergency Use Authorization (EUA). This EUA will remain  in effect (meaning this test can be used) for  the duration of the COVID-19 declaration under Section 56 4(b)(1) of the Act, 21 U.S.C. section 360bbb-3(b)(1), unless the authorization is terminated or revoked sooner. Performed at [facility], [place] [address], [place], [place] [address]     Studies/Results: DG Chest 2 View  Result Date: 07/28/2019 CLINICAL DATA:  Sickle cell crises, mid chest pain, shortness of breath EXAM: CHEST - 2 VIEW COMPARISON:  Chest radiograph 07/25/2019 FINDINGS: Stable cardiomediastinal contours. Unchanged appearance of bilateral Port-A-Cath. There are chronic bilateral interstitial markings. No new focal pulmonary opacity. No pneumothorax or pleural effusion. No acute finding in the visualized skeleton. IMPRESSION: No acute cardiopulmonary finding. Electronically Signed   By: Audie Pinto M.D.   On: 07/28/2019 20:50    Medications: Scheduled Meds: . Chlorhexidine Gluconate Cloth  6 each Topical Daily  . HYDROmorphone   Intravenous Q4H  . ketorolac  15 mg Intravenous Q6H  . oxyCODONE  10 mg Oral Q12H  . rivaroxaban  20 mg Oral Daily  . senna-docusate  1 tablet Oral BID   Continuous Infusions: PRN Meds:.diphenhydrAMINE, naloxone **AND** sodium chloride flush, polyethylene glycol, promethazine **OR** promethazine  Consultants:  None  Procedures:  None  Antibiotics:  None  Assessment/Plan: Principal Problem:   Sickle cell crisis acute chest syndrome (HCC) Active Problems:   Leukocytosis   History of pulmonary embolism   Hypokalemia  Sickle cell disease with pain crisis: Continue IV Dilaudid PCA, no change in settings on today Toradol 15 mg IV for total of 5 days Monitor vital signs closely, reevaluate pain scale regularly, supplemental oxygen as needed  Chronic pain syndrome: Continue home medications.  Sickle cell anemia: Hemoglobin 7.3, appears to be consistent with patient's baseline.  No clinical indication for blood transfusion at this time.  Continue to follow  closely.  Leukocytosis: WBCs 15.6, improved from previous.  Patient afebrile without signs of infection or inflammation.  Continue to monitor closely.  History of pulmonary embolism: Continue Xarelto  Code Status: Full Code Family Communication: N/A Disposition Plan: Not yet ready for discharge  [place], MSN, FNP-C [facility] [place] [address] [place], [place] [address] [phone] If 5PM-7AM, please contact night-coverage.  07/30/2019, 1:01 PM  LOS: 1 day

## 2019-07-31 DIAGNOSIS — D5701 Hb-SS disease with acute chest syndrome: Secondary | ICD-10-CM | POA: Diagnosis not present

## 2019-07-31 MED ORDER — PROMETHAZINE HCL 25 MG/ML IJ SOLN
12.5000 mg | Freq: Once | INTRAMUSCULAR | Status: AC
  Administered 2019-07-31: 12.5 mg via INTRAVENOUS
  Filled 2019-07-31: qty 1

## 2019-07-31 NOTE — Progress Notes (Signed)
Subjective: Patient is a 39 year old female with known history of sickle cell disease and PE on Xarelto who was admitted and is currently on treatment.  Patient continues to have pain at 7 out of 10.  Has been having uncontrolled blood pressure also.  She has some nausea but denied any fever or chills denied any abdominal pain.  She has been on Dilaudid PCA has use it effectively.  Also on Toradol and IV fluids.  Patient has used about 34 mg of the Dilaudid overnight.  Objective: Vital signs in last 24 hours: Temp:  [98.3 F (36.8 C)-98.8 F (37.1 C)] 98.8 F (37.1 C) (02/13 0532) Pulse Rate:  [71-91] 87 (02/13 0532) Resp:  [8-18] 10 (02/13 0532) BP: (88-124)/(58-97) 124/97 (02/13 0532) SpO2:  [94 %-98 %] 97 % (02/13 0532) Weight change:  Last BM Date: 07/28/19  Intake/Output from previous day: 02/12 0701 - 02/13 0700 In: 270 [P.O.:240; I.V.:30] Out: -  Intake/Output this shift: Total I/O In: 240 [P.O.:240] Out: -   General appearance: alert, cooperative, appears stated age and no distress Head: Normocephalic, without obvious abnormality, atraumatic Resp: clear to auscultation bilaterally Cardio: regular rate and rhythm, S1, S2 normal, no murmur, click, rub or gallop GI: soft, non-tender; bowel sounds normal; no masses,  no organomegaly Extremities: extremities normal, atraumatic, no cyanosis or edema Pulses: 2+ and symmetric Skin: Skin color, texture, turgor normal. No rashes or lesions Neurologic: Grossly normal  Lab Results: Recent Labs    07/29/19 0308 07/30/19 0746  WBC 18.8* 15.6*  HGB 8.2* 7.3*  HCT 25.4* 23.2*  PLT 598* 634*   BMET Recent Labs    07/28/19 2149 07/28/19 2149 07/29/19 0308 07/30/19 0746  NA 139  --   --  137  K 3.2*  --   --  3.7  CL 107  --   --  104  CO2 24  --   --  25  GLUCOSE 99  --   --  98  BUN 5*  --   --  7  CREATININE 0.35*   < > 0.35* 0.46  CALCIUM 9.1  --   --  8.7*   < > = values in this interval not displayed.     Studies/Results: No results found.  Medications: I have reviewed the patient's current medications.  Assessment/Plan: A 40 year old female admitted with sickle cell painful crisis.  #1 sickle cell painful crisis: Patient is doing better on current regimen.  Pain is slowly improving.  Originally was at 9 out of 10 currently 7 out of 10.  We will continue with Dilaudid PCA Toradol and IV fluids and reassess pain in the morning  #2 anemia of chronic disease: Hemoglobin has dropped from 8.2-7.3.  Most likely due to hemodilution.  Continue monitor  #3 hypokalemia: Repleted.  #4 history of pulmonary embolism: Continue with Xarelto.  #5 chronic pain syndrome: Continue home medications.  #6 leukocytosis: White count has improved from 18 g to 15.  Continue monitoring   LOS: 2 days   Zale Marcotte,LAWAL 07/31/2019, 6:48 AM

## 2019-08-01 DIAGNOSIS — D5701 Hb-SS disease with acute chest syndrome: Secondary | ICD-10-CM | POA: Diagnosis not present

## 2019-08-01 LAB — CBC WITH DIFFERENTIAL/PLATELET
Abs Immature Granulocytes: 0.16 10*3/uL — ABNORMAL HIGH (ref 0.00–0.07)
Basophils Absolute: 0.1 10*3/uL (ref 0.0–0.1)
Basophils Relative: 1 %
Eosinophils Absolute: 0.7 10*3/uL — ABNORMAL HIGH (ref 0.0–0.5)
Eosinophils Relative: 4 %
HCT: 21.5 % — ABNORMAL LOW (ref 36.0–46.0)
Hemoglobin: 6.8 g/dL — CL (ref 12.0–15.0)
Immature Granulocytes: 1 %
Lymphocytes Relative: 24 %
Lymphs Abs: 3.9 10*3/uL (ref 0.7–4.0)
MCH: 28.2 pg (ref 26.0–34.0)
MCHC: 31.6 g/dL (ref 30.0–36.0)
MCV: 89.2 fL (ref 80.0–100.0)
Monocytes Absolute: 2.4 10*3/uL — ABNORMAL HIGH (ref 0.1–1.0)
Monocytes Relative: 15 %
Neutro Abs: 9 10*3/uL — ABNORMAL HIGH (ref 1.7–7.7)
Neutrophils Relative %: 55 %
Platelets: 592 10*3/uL — ABNORMAL HIGH (ref 150–400)
RBC: 2.41 MIL/uL — ABNORMAL LOW (ref 3.87–5.11)
RDW: 19.1 % — ABNORMAL HIGH (ref 11.5–15.5)
WBC: 16.2 10*3/uL — ABNORMAL HIGH (ref 4.0–10.5)
nRBC: 0.2 % (ref 0.0–0.2)

## 2019-08-01 LAB — COMPREHENSIVE METABOLIC PANEL
ALT: 15 U/L (ref 0–44)
AST: 20 U/L (ref 15–41)
Albumin: 3.8 g/dL (ref 3.5–5.0)
Alkaline Phosphatase: 53 U/L (ref 38–126)
Anion gap: 7 (ref 5–15)
BUN: 9 mg/dL (ref 6–20)
CO2: 26 mmol/L (ref 22–32)
Calcium: 8.5 mg/dL — ABNORMAL LOW (ref 8.9–10.3)
Chloride: 106 mmol/L (ref 98–111)
Creatinine, Ser: 0.44 mg/dL (ref 0.44–1.00)
GFR calc Af Amer: >60 mL/min (ref >60)
GFR calc non Af Amer: >60 mL/min (ref >60)
Glucose, Bld: 104 mg/dL — ABNORMAL HIGH (ref 70–99)
Potassium: 3.7 mmol/L (ref 3.5–5.1)
Sodium: 139 mmol/L (ref 135–145)
Total Bilirubin: 2.7 mg/dL — ABNORMAL HIGH (ref 0.3–1.2)
Total Protein: 6.7 g/dL (ref 6.5–8.1)

## 2019-08-01 MED ORDER — HYDROXYZINE HCL 25 MG PO TABS
25.0000 mg | ORAL_TABLET | Freq: Three times a day (TID) | ORAL | Status: DC | PRN
  Filled 2019-08-01: qty 1

## 2019-08-01 NOTE — Progress Notes (Signed)
Subjective: Patient is doing better and today.  Pain is down to 6 out of 10.  No fever or chills no nausea vomiting or diarrhea.  She is still using the Dilaudid PCA and has used 35 mg overnight.  Patient has also been able to walk around the the room.  She is not yet at baseline.  Her goal is pain of 5 or less out of 10.  Objective: Vital signs in last 24 hours: Temp:  [98.8 F (37.1 C)-99.2 F (37.3 C)] 98.8 F (37.1 C) (02/14 2115) Pulse Rate:  [74-96] 74 (02/14 2115) Resp:  [9-19] 15 (02/14 2115) BP: (105-125)/(74-86) 109/75 (02/14 2115) SpO2:  [93 %-98 %] 98 % (02/14 2115) Weight change:  Last BM Date: 07/28/19  Intake/Output from previous day: 02/13 0701 - 02/14 0700 In: 240 [P.O.:240] Out: -  Intake/Output this shift: Total I/O In: 240 [P.O.:240] Out: -   General appearance: alert, cooperative, appears stated age and no distress Head: Normocephalic, without obvious abnormality, atraumatic Resp: clear to auscultation bilaterally Cardio: regular rate and rhythm, S1, S2 normal, no murmur, click, rub or gallop GI: soft, non-tender; bowel sounds normal; no masses,  no organomegaly Extremities: extremities normal, atraumatic, no cyanosis or edema Pulses: 2+ and symmetric Skin: Skin color, texture, turgor normal. No rashes or lesions Neurologic: Grossly normal  Lab Results: Recent Labs    07/30/19 0746 08/01/19 0519  WBC 15.6* 16.2*  HGB 7.3* 6.8*  HCT 23.2* 21.5*  PLT 634* 592*   BMET Recent Labs    07/30/19 0746 08/01/19 0519  NA 137 139  K 3.7 3.7  CL 104 106  CO2 25 26  GLUCOSE 98 104*  BUN 7 9  CREATININE 0.46 0.44  CALCIUM 8.7* 8.5*    Studies/Results: No results found.  Medications: I have reviewed the patient's current medications.  Assessment/Plan: A 39 year old female admitted with sickle cell painful crisis.  #1 sickle cell painful crisis: Patient is doing better on current regimen.  Pain is slowly improving.  Pain is down to 6 out of 10.   We will continue with Dilaudid PCA Toradol and IV fluids and reassess pain in the morning and if pain is below 5 out of 10 she may be likely going home  #2 anemia of chronic disease: Hemoglobin has drifted down to 6.8.  No indication for transfusion yet.  Continue monitor  #3 hypokalemia: Repleted.  #4 history of pulmonary embolism: Continue with Xarelto.  #5 chronic pain syndrome: Continue home medications.  #6 leukocytosis: White count has improved from 18 g to 15.  Continue monitoring   LOS: 3 days   Iretta Mangrum,LAWAL 08/01/2019, 11:02 PM

## 2019-08-01 NOTE — Progress Notes (Signed)
CRITICAL VALUE ALERT  Critical Value: Hbg 6.8  Date & Time Notied:  08/01/19  Provider Notified:   Orders Received/Actions taken:

## 2019-08-02 ENCOUNTER — Ambulatory Visit: Payer: Medicare Other | Admitting: Nurse Practitioner

## 2019-08-02 DIAGNOSIS — Z86711 Personal history of pulmonary embolism: Secondary | ICD-10-CM | POA: Diagnosis not present

## 2019-08-02 DIAGNOSIS — D5701 Hb-SS disease with acute chest syndrome: Secondary | ICD-10-CM | POA: Diagnosis not present

## 2019-08-02 DIAGNOSIS — D5700 Hb-SS disease with crisis, unspecified: Secondary | ICD-10-CM

## 2019-08-02 LAB — CBC
HCT: 21.4 % — ABNORMAL LOW (ref 36.0–46.0)
Hemoglobin: 7 g/dL — ABNORMAL LOW (ref 12.0–15.0)
MCH: 28.9 pg (ref 26.0–34.0)
MCHC: 32.7 g/dL (ref 30.0–36.0)
MCV: 88.4 fL (ref 80.0–100.0)
Platelets: 587 10*3/uL — ABNORMAL HIGH (ref 150–400)
RBC: 2.42 MIL/uL — ABNORMAL LOW (ref 3.87–5.11)
RDW: 19.7 % — ABNORMAL HIGH (ref 11.5–15.5)
WBC: 16.8 10*3/uL — ABNORMAL HIGH (ref 4.0–10.5)
nRBC: 0.3 % — ABNORMAL HIGH (ref 0.0–0.2)

## 2019-08-02 LAB — TYPE AND SCREEN
ABO/RH(D): A POS
Antibody Screen: NEGATIVE

## 2019-08-02 MED ORDER — HYDROMORPHONE 1 MG/ML IV SOLN
INTRAVENOUS | Status: DC
  Administered 2019-08-02: 4 mg via INTRAVENOUS
  Administered 2019-08-02: 2.5 mg via INTRAVENOUS
  Administered 2019-08-02: 6.5 mg via INTRAVENOUS
  Administered 2019-08-02: 30 mg via INTRAVENOUS
  Administered 2019-08-03: 2.5 mg via INTRAVENOUS
  Administered 2019-08-03: 2 mg via INTRAVENOUS
  Administered 2019-08-03: 4.5 mg via INTRAVENOUS
  Filled 2019-08-02: qty 30

## 2019-08-02 MED ORDER — OXYCODONE HCL 5 MG PO TABS
10.0000 mg | ORAL_TABLET | ORAL | Status: DC | PRN
  Administered 2019-08-02 – 2019-08-04 (×8): 10 mg via ORAL
  Filled 2019-08-02 (×8): qty 2

## 2019-08-02 NOTE — Care Management Important Message (Signed)
Important Message  Patient Details IM Letter given to Marney Doctor RN Case Manager to present to the Patient Name: Molly Gutierrez MRN: [id number] Date of Birth: [birth year]   Medicare Important Message Given:  Yes     Kerin Salen 08/02/2019, 10:36 AM

## 2019-08-02 NOTE — Progress Notes (Signed)
Subjective: [facility], a 39 year old female with a medical history significant for sickle cell disease, chronic pain syndrome, opiate dependence and tolerance, history of PE on Xarelto, history of mild intermittent asthma, and history of anemia of chronic disease was admitted for sickle cell pain crisis.  Pain intensity is 7/10 primarily to mid chest and low back.  Patient states that pain worsened overnight.  Patient denies headache, chest pain, shortness of breath, urinary symptoms, nausea, vomiting, or diarrhea.  Objective:  Vital signs in last 24 hours:  Vitals:   08/02/19 0944 08/02/19 1226 08/02/19 1315 08/02/19 1607  BP: 109/67  100/64   Pulse: 79  73   Resp: 18 10 14 13   Temp: 98.9 F (37.2 C)  98.7 F (37.1 C)   TempSrc: Oral  Oral   SpO2: 96% 98% 96% 97%  Weight:      Height:        Intake/Output from previous day:   Intake/Output Summary (Last 24 hours) at 08/02/2019 1621 Last data filed at 08/02/2019 0930 Gross per 24 hour  Intake 720 ml  Output --  Net 720 ml    Physical Exam: General: Alert, awake, oriented x3, in no acute distress.  HEENT: [place]/AT PEERL, EOMI Neck: Trachea midline,  no masses, no thyromegal,y no JVD, no carotid bruit OROPHARYNX:  Moist, No exudate/ erythema/lesions.  Heart: Regular rate and rhythm, without murmurs, rubs, gallops, PMI non-displaced, no heaves or thrills on palpation.  Lungs: Clear to auscultation, no wheezing or rhonchi noted. No increased vocal fremitus resonant to percussion  Abdomen: Soft, nontender, nondistended, positive bowel sounds, no masses no hepatosplenomegaly noted..  Neuro: No focal neurological deficits noted cranial nerves II through XII grossly intact. DTRs 2+ bilaterally upper and lower extremities. Strength 5 out of 5 in bilateral upper and lower extremities. Musculoskeletal: No warm swelling or erythema around joints, no spinal tenderness noted. Psychiatric: Patient alert and oriented x3, good  insight and cognition, good recent to remote recall. Lymph node survey: No cervical axillary or inguinal lymphadenopathy noted.  Lab Results:  Basic Metabolic Panel:    Component Value Date/Time   NA 139 08/01/2019 0519   K 3.7 08/01/2019 0519   CL 106 08/01/2019 0519   CO2 26 08/01/2019 0519   BUN 9 08/01/2019 0519   CREATININE 0.44 08/01/2019 0519   GLUCOSE 104 (H) 08/01/2019 0519   CALCIUM 8.5 (L) 08/01/2019 0519   CBC:    Component Value Date/Time   WBC 16.8 (H) 08/02/2019 0710   HGB 7.0 (L) 08/02/2019 0710   HCT 21.4 (L) 08/02/2019 0710   PLT 587 (H) 08/02/2019 0710   MCV 88.4 08/02/2019 0710   NEUTROABS 9.0 (H) 08/01/2019 0519   LYMPHSABS 3.9 08/01/2019 0519   MONOABS 2.4 (H) 08/01/2019 0519   EOSABS 0.7 (H) 08/01/2019 0519   BASOSABS 0.1 08/01/2019 0519    Recent Results (from the past 240 hour(s))  SARS CORONAVIRUS 2 (TAT 6-24 HRS) Nasopharyngeal Nasopharyngeal Swab     Status: None   Collection Time: 07/29/19  2:30 AM   Specimen: Nasopharyngeal Swab  Result Value Ref Range Status   SARS Coronavirus 2 NEGATIVE NEGATIVE Final    Comment: (NOTE) SARS-CoV-2 target nucleic acids are NOT DETECTED. The SARS-CoV-2 RNA is generally detectable in upper and lower respiratory specimens during the acute phase of infection. Negative results do not preclude SARS-CoV-2 infection, do not rule out co-infections with other pathogens, and should not be used as the sole basis for treatment or other patient  management decisions. Negative results must be combined with clinical observations, patient history, and epidemiological information. The expected result is Negative. Fact Sheet for Patients: SugarRoll.be Fact Sheet for Healthcare Providers: https://www.woods-mathews.com/ This test is not yet approved or cleared by the [place] FDA and  has been authorized for detection and/or diagnosis of SARS-CoV-2 by FDA under an Emergency  Use Authorization (EUA). This EUA will remain  in effect (meaning this test can be used) for the duration of the COVID-19 declaration under Section 56 4(b)(1) of the Act, 21 U.S.C. section 360bbb-3(b)(1), unless the authorization is terminated or revoked sooner. Performed at [facility], [place] [address], [place], [place] [address]     Studies/Results: No results found.  Medications: Scheduled Meds: . Chlorhexidine Gluconate Cloth  6 each Topical Daily  . HYDROmorphone   Intravenous Q4H  . ketorolac  15 mg Intravenous Q6H  . oxyCODONE  10 mg Oral Q12H  . rivaroxaban  20 mg Oral Daily  . senna-docusate  1 tablet Oral BID   Continuous Infusions: PRN Meds:.hydrOXYzine, naloxone **AND** sodium chloride flush, oxyCODONE, polyethylene glycol, promethazine **OR** promethazine  Consultants:  None  Procedures:  None  Antibiotics:  None  Assessment/Plan: Principal Problem:   Sickle cell crisis acute chest syndrome (HCC) Active Problems:   Leukocytosis   History of pulmonary embolism   Hypokalemia   Sickle cell disease with pain crisis: Weaning IV Dilaudid PCA Oxycodone 10 mg every 4 hours as needed for severe breakthrough pain Toradol 15 mg IV every 6 hours for total of 5 days. Monitor vital signs closely, reevaluate pain scale regularly, supplemental oxygen as needed.  Anemia of chronic disease: Hemoglobin 7.0, consistent with patient's baseline.  No indication for transfusion at this time.  Repeat CBC in a.m.  Hypokalemia: Repleted.  Continue to monitor closely.  BMP in a.m.  History of pulmonary embolism: Continue Xarelto  Chronic pain syndrome: Continue home medications  Leukocytosis: Patient appears to have chronic leukocytosis.  She is afebrile.  No signs of infection or inflammation.  Improved from previous.  Continue to monitor without antibiotics.  CBC in a.m.  Code Status: Full Code Family Communication: N/A Disposition Plan: Not yet ready for  discharge  [place], MSN, FNP-C Patient [facility] [address] [place], [place] [address] [phone]  If 5PM-7AM, please contact night-coverage.  08/02/2019, 4:21 PM  LOS: 4 days

## 2019-08-02 NOTE — Plan of Care (Signed)

## 2019-08-03 DIAGNOSIS — D5701 Hb-SS disease with acute chest syndrome: Secondary | ICD-10-CM | POA: Diagnosis not present

## 2019-08-03 DIAGNOSIS — E876 Hypokalemia: Secondary | ICD-10-CM

## 2019-08-03 MED ORDER — HYDROMORPHONE 1 MG/ML IV SOLN
INTRAVENOUS | Status: DC
  Administered 2019-08-03: 1 mg via INTRAVENOUS
  Administered 2019-08-03: 1.5 mg via INTRAVENOUS
  Administered 2019-08-03: 1 mg via INTRAVENOUS
  Administered 2019-08-04: 2.5 mg via INTRAVENOUS
  Administered 2019-08-04: 2 mg via INTRAVENOUS
  Administered 2019-08-04: 1 mg via INTRAVENOUS

## 2019-08-03 NOTE — Progress Notes (Signed)
Subjective: [facility], a 39 year old female with a medical history significant for sickle cell disease, chronic pain syndrome, opiate dependence and tolerance, history of PE on Xarelto, history of mild intermittent asthma, and history of anemia of chronic disease was admitted for sickle cell pain crisis  Pain intensity is 6/10 primarily to mid chest and low back.  Patient says pain is improved some overnight.  She has no new complaints on today. She denies headache, chest pain, shortness of breath, urinary symptoms, nausea, vomiting, or diarrhea.  Objective:  Vital signs in last 24 hours:  Vitals:   08/03/19 0558 08/03/19 0744 08/03/19 0959 08/03/19 1207  BP: 117/78  116/86   Pulse: 73  83   Resp: 18 10 14 11   Temp: 99.1 F (37.3 C)  98.2 F (36.8 C)   TempSrc: Oral  Oral   SpO2: 96% 97% 95% 97%  Weight:      Height:        Intake/Output from previous day:  No intake or output data in the 24 hours ending 08/03/19 1248  Physical Exam: General: Alert, awake, oriented x3, in no acute distress.  HEENT: [place]/AT PEERL, EOMI Neck: Trachea midline,  no masses, no thyromegal,y no JVD, no carotid bruit OROPHARYNX:  Moist, No exudate/ erythema/lesions.  Heart: Regular rate and rhythm, without murmurs, rubs, gallops, PMI non-displaced, no heaves or thrills on palpation.  Lungs: Clear to auscultation, no wheezing or rhonchi noted. No increased vocal fremitus resonant to percussion  Abdomen: Soft, nontender, nondistended, positive bowel sounds, no masses no hepatosplenomegaly noted..  Neuro: No focal neurological deficits noted cranial nerves II through XII grossly intact. DTRs 2+ bilaterally upper and lower extremities. Strength 5 out of 5 in bilateral upper and lower extremities. Musculoskeletal: No warm swelling or erythema around joints, no spinal tenderness noted. Psychiatric: Patient alert and oriented x3, good insight and cognition, good recent to remote recall. Lymph node  survey: No cervical axillary or inguinal lymphadenopathy noted.  Lab Results:  Basic Metabolic Panel:    Component Value Date/Time   NA 139 08/01/2019 0519   K 3.7 08/01/2019 0519   CL 106 08/01/2019 0519   CO2 26 08/01/2019 0519   BUN 9 08/01/2019 0519   CREATININE 0.44 08/01/2019 0519   GLUCOSE 104 (H) 08/01/2019 0519   CALCIUM 8.5 (L) 08/01/2019 0519   CBC:    Component Value Date/Time   WBC 16.8 (H) 08/02/2019 0710   HGB 7.0 (L) 08/02/2019 0710   HCT 21.4 (L) 08/02/2019 0710   PLT 587 (H) 08/02/2019 0710   MCV 88.4 08/02/2019 0710   NEUTROABS 9.0 (H) 08/01/2019 0519   LYMPHSABS 3.9 08/01/2019 0519   MONOABS 2.4 (H) 08/01/2019 0519   EOSABS 0.7 (H) 08/01/2019 0519   BASOSABS 0.1 08/01/2019 0519    Recent Results (from the past 240 hour(s))  SARS CORONAVIRUS 2 (TAT 6-24 HRS) Nasopharyngeal Nasopharyngeal Swab     Status: None   Collection Time: 07/29/19  2:30 AM   Specimen: Nasopharyngeal Swab  Result Value Ref Range Status   SARS Coronavirus 2 NEGATIVE NEGATIVE Final    Comment: (NOTE) SARS-CoV-2 target nucleic acids are NOT DETECTED. The SARS-CoV-2 RNA is generally detectable in upper and lower respiratory specimens during the acute phase of infection. Negative results do not preclude SARS-CoV-2 infection, do not rule out co-infections with other pathogens, and should not be used as the sole basis for treatment or other patient management decisions. Negative results must be combined with clinical observations, patient history,  and epidemiological information. The expected result is Negative. Fact Sheet for Patients: SugarRoll.be Fact Sheet for Healthcare Providers: https://www.woods-mathews.com/ This test is not yet approved or cleared by the [place] FDA and  has been authorized for detection and/or diagnosis of SARS-CoV-2 by FDA under an Emergency Use Authorization (EUA). This EUA will remain  in effect (meaning  this test can be used) for the duration of the COVID-19 declaration under Section 56 4(b)(1) of the Act, 21 U.S.C. section 360bbb-3(b)(1), unless the authorization is terminated or revoked sooner. Performed at [facility], [place] [address] [place], [place] [address]     Studies/Results: No results found.  Medications: Scheduled Meds: . Chlorhexidine Gluconate Cloth  6 each Topical Daily  . HYDROmorphone   Intravenous Q4H  . oxyCODONE  10 mg Oral Q12H  . rivaroxaban  20 mg Oral Daily  . senna-docusate  1 tablet Oral BID   Continuous Infusions: PRN Meds:.hydrOXYzine, naloxone **AND** sodium chloride flush, oxyCODONE, polyethylene glycol, promethazine **OR** promethazine  Consultants:  None  Procedures:  None  Antibiotics:  None  Assessment/Plan: Principal Problem:   Sickle cell crisis acute chest syndrome (HCC) Active Problems:   Leukocytosis   History of pulmonary embolism   Hypokalemia  Sickle cell disease with pain crisis: Continue to wean IV Dilaudid PCA.  Settings changed to 0.5 mg, 10-minute lockout, and 1 mg/h Oxycodone 4 mg every 4 hours while awake Toradol 15 mg IV every 6 hours for total of 5 days. Monitor vital signs closely, reevaluate pain scale regularly, supplemental oxygen as needed.  Anemia of chronic disease: Hemoglobin stable.  Consistent with patient's baseline.  No clinical indication for blood transfusion at this time.  Follow labs in AM.  Hypokalemia: Repleted.  Resolved.  Continue to monitor closely.  Follow labs in AM.  History of pulmonary embolism: Continue Xarelto  Chronic pain syndrome: Continue home medications  Leukocytosis: Chronic leukocytosis.  Improving previous.  Patient is afebrile.  No signs of infection or inflammation.  Continue to monitor without antibiotics.  Code Status: Full Code Family Communication: N/A Disposition Plan: Not yet ready for discharge  [place], MSN, FNP-C Patient  [facility] [address] [place], [place] [address] [phone]  If 5PM-7AM, please contact night-coverage.  08/03/2019, 12:48 PM  LOS: 5 days

## 2019-08-04 DIAGNOSIS — Z86711 Personal history of pulmonary embolism: Secondary | ICD-10-CM | POA: Diagnosis not present

## 2019-08-04 DIAGNOSIS — D72829 Elevated white blood cell count, unspecified: Secondary | ICD-10-CM

## 2019-08-04 DIAGNOSIS — D473 Essential (hemorrhagic) thrombocythemia: Secondary | ICD-10-CM

## 2019-08-04 DIAGNOSIS — D5701 Hb-SS disease with acute chest syndrome: Secondary | ICD-10-CM | POA: Diagnosis not present

## 2019-08-04 DIAGNOSIS — E876 Hypokalemia: Secondary | ICD-10-CM | POA: Diagnosis not present

## 2019-08-04 LAB — BASIC METABOLIC PANEL
Anion gap: 7 (ref 5–15)
BUN: <5 mg/dL — ABNORMAL LOW (ref 6–20)
CO2: 24 mmol/L (ref 22–32)
Calcium: 8.8 mg/dL — ABNORMAL LOW (ref 8.9–10.3)
Chloride: 105 mmol/L (ref 98–111)
Creatinine, Ser: 0.43 mg/dL — ABNORMAL LOW (ref 0.44–1.00)
GFR calc Af Amer: >60 mL/min (ref >60)
GFR calc non Af Amer: >60 mL/min (ref >60)
Glucose, Bld: 100 mg/dL — ABNORMAL HIGH (ref 70–99)
Potassium: 3.6 mmol/L (ref 3.5–5.1)
Sodium: 136 mmol/L (ref 135–145)

## 2019-08-04 LAB — CBC
HCT: 22.7 % — ABNORMAL LOW (ref 36.0–46.0)
Hemoglobin: 7.5 g/dL — ABNORMAL LOW (ref 12.0–15.0)
MCH: 29.2 pg (ref 26.0–34.0)
MCHC: 33 g/dL (ref 30.0–36.0)
MCV: 88.3 fL (ref 80.0–100.0)
Platelets: 606 10*3/uL — ABNORMAL HIGH (ref 150–400)
RBC: 2.57 MIL/uL — ABNORMAL LOW (ref 3.87–5.11)
RDW: 20.3 % — ABNORMAL HIGH (ref 11.5–15.5)
WBC: 15.1 10*3/uL — ABNORMAL HIGH (ref 4.0–10.5)
nRBC: 0.3 % — ABNORMAL HIGH (ref 0.0–0.2)

## 2019-08-04 MED ORDER — OXYCODONE HCL 10 MG PO TABS: 10.0000 mg | ORAL_TABLET | ORAL | 0 refills | Status: DC | PRN

## 2019-08-04 MED ORDER — OXYCODONE HCL ER 10 MG PO T12A: 10.0000 mg | EXTENDED_RELEASE_TABLET | Freq: Two times a day (BID) | ORAL | 0 refills | Status: DC

## 2019-08-04 MED ORDER — HEPARIN SOD (PORK) LOCK FLUSH 100 UNIT/ML IV SOLN
500.0000 [IU] | INTRAVENOUS | Status: AC | PRN
  Administered 2019-08-04: 500 [IU]
  Filled 2019-08-04: qty 5

## 2019-08-04 NOTE — Progress Notes (Signed)
Patient stated she would do CHG bath later, walked back in room & patient has still not completed CHG bath. Refused again at this time.

## 2019-08-04 NOTE — Progress Notes (Signed)
Reviewed discharge paperwork, follow up appointments, and medication regimen. Provided information for pharmacy to pick up prescriptions. Patient declined wheelchair escort and walked to main entrance.

## 2019-08-04 NOTE — Discharge Summary (Signed)
Physician Discharge Summary  [facility] JN:[id number] DOB: April 14, 1981 DOA: 07/28/2019  PCP: Rosana Berger, FNP  Admit date: 07/28/2019  Discharge date: 08/04/2019  Discharge Diagnoses:  Principal Problem:   Sickle cell crisis acute chest syndrome [facility]) Active Problems:   Leukocytosis   History of pulmonary embolism   Hypokalemia   Discharge Condition: Stable  Disposition:  Follow-up Information    Vevelyn Francois, NP Follow up in 1 week(s).   Specialty: Adult Health Nurse Practitioner Contact information: [address] [place] [address] [phone]          Pt is discharged home in good condition and is to follow up with Rosana Berger, FNP this week to have labs evaluated. Molly Gutierrez is instructed to increase activity slowly and balance with rest for the next few days, and use prescribed medication to complete treatment of pain  Diet: Regular Wt Readings from Last 3 Encounters:  07/28/19 56.2 kg  07/25/19 57 kg  07/13/19 56.7 kg    History of present illness:  [facility], a 39 year old female with a medical history significant for sickle cell disease, chronic pain syndrome, opiate dependence and tolerance, pulmonary embolism on Xarelto, history of anemia of chronic disease, and history of mild intermittent asthma presented to emergency department for evaluation of sickle cell pain crisis.  Patient states that pain started in chest 3 days prior and continued to worsen.  Now she is having pain to left lower extremity.  Patient further mentioned that she had similar episodes of pain in the past when she was having sickle cell crisis.  Pain is characterized as sharp and sometimes colicky, located in the entire chest.  Pain intensity 8/10.  Pain is worsened and has not improved with home medications.  Patient also admits having nausea and some vomiting.  She denies fever, chills, shortness of breath, diaphoresis, palpitations,  abdominal pain, constipation, diarrhea, and urinary symptoms.  Patient was recently admitted at [facility] for sickle cell pain crisis and was inpatient for a total of 10 days.  She was diagnosed 3 days prior and this is her second admission for sickle cell pain.  ER course: In the ER on arrival patient had 99.6 temperature, blood pressure 115/78, HR 95, RR 14, and oxygen saturation 98% on RA.  Labs showed WBCs 17.9, which is at baseline, potassium 3.2.  Troponin negative.  Chest x-ray negative for acute cardiopulmonary process.  EKG shows some sinus rhythm with nonspecific T wave abnormalities in the anterior leads.  Patient was managed with multiple doses of IV Dilaudid and promethazine, potassium was supplemented in the ER.  Admitted to [place] for management of pain crisis.  Hospital Course:  Sickle cell disease with pain crisis Patient was admitted for sickle cell pain crisis and managed appropriately with IVF, IV Dilaudid via PCA and IV Toradol, as well as other adjunct therapies per sickle cell pain management protocols. IV Dilaudid PCA was weaned appropriately.  Patient was transitioned to home medications.  OxyContin continued throughout admission without interruption.  Patient says that she is out of home pain medications.  Sent OxyContin 10 mg #14 and oxycodone 10 mg #42 to patient's pharmacy.  Patient will follow-up with PCP for medication management.  Also, advised to follow-up with hematologist.  Patient pain intensity is 5/10.  Sickle cell anemia: Hemoglobin decreased to 6.8, which is below patient's baseline.  Patient received 1 unit PRBCs.  Hemoglobin returned to baseline and is 7.5 prior to  discharge.   Patient is alert, oriented, and ambulating without assistance.  She is afebrile and oxygen saturation is 98% on RA.    Patient was discharged home today in a hemodynamically stable condition.   Discharge Exam: Vitals:   08/04/19 0943 08/04/19 1150  BP: 110/74    Pulse: 74   Resp: 18 14  Temp: 98.9 F (37.2 C)   SpO2: 96% 96%   Vitals:   08/04/19 0400 08/04/19 0703 08/04/19 0943 08/04/19 1150  BP: 112/72  110/74   Pulse: 66  74   Resp: 16 11 18 14   Temp: 99.2 F (37.3 C)  98.9 F (37.2 C)   TempSrc: Oral  Oral   SpO2: 95% 97% 96% 96%  Weight:      Height:        General appearance : Awake, alert, not in any distress. Speech Clear. Not toxic looking HEENT: Atraumatic and Normocephalic, pupils equally reactive to light and accomodation Neck: Supple, no JVD. No cervical lymphadenopathy.  Chest: Good air entry bilaterally, no added sounds  CVS: S1 S2 regular, no murmurs.  Abdomen: Bowel sounds present, Non tender and not distended with no gaurding, rigidity or rebound. Extremities: B/L Lower Ext shows no edema, both legs are warm to touch Neurology: Awake alert, and oriented X 3, CN II-XII intact, Non focal Skin: No Rash  Discharge Instructions  Discharge Instructions    Discharge patient   Complete by: As directed    Discharge disposition: 01-Home or Self Care   Discharge patient date: 08/04/2019     Allergies as of 08/04/2019      Reactions   Cefaclor Hives, Swelling   Hydroxyurea Other (See Comments), Palpitations   Lower blood levels and HR Other reaction(s): Hypotension, Other (See Comments) "it messes me up, it drops my levels and stuff" Lower blood levels and HR "it messes me up, it drops my levels and stuff"      Medication List    TAKE these medications   albuterol 108 (90 Base) MCG/ACT inhaler Commonly known as: VENTOLIN HFA Inhale 2 puffs into the lungs 2 (two) times daily as needed for wheezing or shortness of breath.   Deferasirox 360 MG Tabs Take 1,080 mg by mouth daily.   diphenhydrAMINE 25 mg capsule Commonly known as: BENADRYL Take 25 mg by mouth 3 (three) times daily as needed for itching.   folic acid 1 MG tablet Commonly known as: FOLVITE Take 1 mg by mouth daily.   mirtazapine 45 MG  tablet Commonly known as: REMERON Take 45 mg by mouth at bedtime.   mometasone-formoterol 100-5 MCG/ACT Aero Commonly known as: DULERA Inhale 2 puffs into the lungs daily as needed for wheezing or shortness of breath.   omeprazole 20 MG capsule Commonly known as: PRILOSEC Take 20 mg by mouth 2 (two) times a day.   Oxycodone HCl 10 MG Tabs Take 1 tablet (10 mg total) by mouth every 4 (four) hours as needed (pain). Every 4 hours while awake   oxyCODONE 10 mg 12 hr tablet Commonly known as: OXYCONTIN Take 1 tablet (10 mg total) by mouth every 12 (twelve) hours.   promethazine 25 MG tablet Commonly known as: PHENERGAN Take 25 mg by mouth every 8 (eight) hours as needed for nausea/vomiting.   vitamin B-12 1000 MCG tablet Commonly known as: CYANOCOBALAMIN Take 1,000 mcg by mouth daily.   Vitamin D3 25 MCG (1000 UT) Caps Take 1,000 Units by mouth daily.   Xarelto 20 MG Tabs tablet  Generic drug: rivaroxaban Take 1 tablet (20 mg total) by mouth daily.       The results of significant diagnostics from this hospitalization (including imaging, microbiology, ancillary and laboratory) are listed below for reference.    Significant Diagnostic Studies: DG Chest 2 View  Result Date: 07/28/2019 CLINICAL DATA:  Sickle cell crises, mid chest pain, shortness of breath EXAM: CHEST - 2 VIEW COMPARISON:  Chest radiograph 07/25/2019 FINDINGS: Stable cardiomediastinal contours. Unchanged appearance of bilateral Port-A-Cath. There are chronic bilateral interstitial markings. No new focal pulmonary opacity. No pneumothorax or pleural effusion. No acute finding in the visualized skeleton. IMPRESSION: No acute cardiopulmonary finding. Electronically Signed   By: Audie Pinto M.D.   On: 07/28/2019 20:50   DG Chest 2 View  Result Date: 07/25/2019 CLINICAL DATA:  Chest pain, sickle cell EXAM: CHEST - 2 VIEW COMPARISON:  07/12/2018 FINDINGS: The heart size and mediastinal contours are within normal  limits. Left and right chest port catheters. Both lungs are clear. The visualized skeletal structures are unremarkable. IMPRESSION: No acute abnormality of the lungs. Electronically Signed   By: Eddie Candle M.D.   On: 07/25/2019 18:52   DG Chest 2 View  Result Date: 07/13/2019 CLINICAL DATA:  Chest pain beginning 2 days ago with progression. Sickle cell disease. EXAM: CHEST - 2 VIEW COMPARISON:  One-view chest x-ray 07/07/2019. FINDINGS: The heart size is normal. Bilateral Port-A-Cath is are again noted. Chronic interstitial changes are present. There is no edema or effusion. No focal airspace disease is present. Atherosclerotic calcifications are present at the gallbladder fossa. IMPRESSION: No acute cardiopulmonary disease or significant interval change. Electronically Signed   By: San Morelle M.D.   On: 07/13/2019 12:29   DG Chest 2 View  Result Date: 07/07/2019 CLINICAL DATA:  Shortness of breath. Sickle cell crisis since yesterday. EXAM: CHEST - 2 VIEW COMPARISON:  06/26/2019 FINDINGS: Bilateral IJ central venous catheters are unchanged. Lungs are adequately inflated with subtle prominence of the central pulmonary vessels likely minimal vascular congestion. No lobar consolidation or effusion. No pneumothorax. Mild stable cardiomegaly. Remainder of the exam is unchanged. IMPRESSION: Mild stable cardiomegaly with suggestion of minimal vascular congestion. Electronically Signed   By: Marin Olp M.D.   On: 07/07/2019 16:01   CT Angio Chest PE W and/or Wo Contrast  Result Date: 07/07/2019 CLINICAL DATA:  Shortness of breath and chest pain. EXAM: CT ANGIOGRAPHY CHEST WITH CONTRAST TECHNIQUE: Multidetector CT imaging of the chest was performed using the standard protocol during bolus administration of intravenous contrast. Multiplanar CT image reconstructions and MIPs were obtained to evaluate the vascular anatomy. CONTRAST:  45mL OMNIPAQUE IOHEXOL 350 MG/ML SOLN COMPARISON:  April 19, 2019  FINDINGS: Cardiovascular: Bilateral venous Port-A-Cath so seen. Satisfactory opacification of the pulmonary arteries to the segmental level. No evidence of pulmonary embolism. Normal heart size. No pericardial effusion. Mediastinum/Nodes: No enlarged mediastinal, hilar, or axillary lymph nodes. Thyroid gland, trachea, and esophagus demonstrate no significant findings. Lungs/Pleura: Mild atelectasis is seen within the posterior aspect of the bilateral lung bases. There is no evidence of a pleural effusion or pneumothorax. Upper Abdomen: Multiple surgical clips are seen within the gallbladder fossa. The spleen is small and heterogeneous in appearance. Musculoskeletal: A predominant stable 3.5 cm x 2.1 cm soft tissue mass is seen adjacent to the T9 vertebral body on the right. A stable 2.4 cm x 1.9 cm partially calcified soft tissue mass is seen within the right breast (axial CT image 61, CT series number 6). The  osseous structures are diffusely moderate in appearance. Review of the MIP images confirms the above findings. IMPRESSION: 1. No evidence of pulmonary embolism. 2. Stable appearing soft tissue mass adjacent to the T9 vertebral body on the right. 3. Stable 2.4 cm x 1.9 cm partially calcified soft tissue mass within the right breast. 4. Moderate appearing osseous structures consistent with patient's known history of sickle cell disease. Electronically Signed   By: Virgina Norfolk M.D.   On: 07/07/2019 22:47    Microbiology: Recent Results (from the past 240 hour(s))  SARS CORONAVIRUS 2 (TAT 6-24 HRS) Nasopharyngeal Nasopharyngeal Swab     Status: None   Collection Time: 07/29/19  2:30 AM   Specimen: Nasopharyngeal Swab  Result Value Ref Range Status   SARS Coronavirus 2 NEGATIVE NEGATIVE Final    Comment: (NOTE) SARS-CoV-2 target nucleic acids are NOT DETECTED. The SARS-CoV-2 RNA is generally detectable in upper and lower respiratory specimens during the acute phase of infection. Negative results  do not preclude SARS-CoV-2 infection, do not rule out co-infections with other pathogens, and should not be used as the sole basis for treatment or other patient management decisions. Negative results must be combined with clinical observations, patient history, and epidemiological information. The expected result is Negative. Fact Sheet for Patients: SugarRoll.be Fact Sheet for Healthcare Providers: https://www.woods-mathews.com/ This test is not yet approved or cleared by the [place] FDA and  has been authorized for detection and/or diagnosis of SARS-CoV-2 by FDA under an Emergency Use Authorization (EUA). This EUA will remain  in effect (meaning this test can be used) for the duration of the COVID-19 declaration under Section 56 4(b)(1) of the Act, 21 U.S.C. section 360bbb-3(b)(1), unless the authorization is terminated or revoked sooner. Performed at [facility], [place] [address] [place], [place] [address]      Labs: Basic Metabolic Panel: Recent Labs  Lab 07/28/19 2149 07/28/19 2149 07/29/19 0308 07/30/19 0746 07/30/19 0746 08/01/19 0519 08/04/19 0456  NA 139  --   --  137  --  139 136  K 3.2*   < >  --  3.7   < > 3.7 3.6  CL 107  --   --  104  --  106 105  CO2 24  --   --  25  --  26 24  GLUCOSE 99  --   --  98  --  104* 100*  BUN 5*  --   --  7  --  9 <5*  CREATININE 0.35*  --  0.35* 0.46  --  0.44 0.43*  CALCIUM 9.1  --   --  8.7*  --  8.5* 8.8*   < > = values in this interval not displayed.   Liver Function Tests: Recent Labs  Lab 07/28/19 2149 08/01/19 0519  AST 22 20  ALT 21 15  ALKPHOS 62 53  BILITOT 4.4* 2.7*  PROT 8.2* 6.7  ALBUMIN 4.5 3.8   Recent Labs  Lab 07/28/19 2149  LIPASE 29   No results for input(s): AMMONIA in the last 168 hours. CBC: Recent Labs  Lab 07/28/19 2149 07/28/19 2149 07/29/19 0308 07/30/19 0746 08/01/19 0519 08/02/19 0710 08/04/19 0456  WBC 17.9*   < > 18.8*  15.6* 16.2* 16.8* 15.1*  NEUTROABS 12.9*  --   --   --  9.0*  --   --   HGB 8.4*   < > 8.2* 7.3* 6.8* 7.0* 7.5*  HCT 25.8*   < > 25.4*  23.2* 21.5* 21.4* 22.7*  MCV 87.5   < > 88.2 89.6 89.2 88.4 88.3  PLT 654*   < > 598* 634* 592* 587* 606*   < > = values in this interval not displayed.   Cardiac Enzymes: No results for input(s): CKTOTAL, CKMB, CKMBINDEX, TROPONINI in the last 168 hours. BNP: Invalid input(s): POCBNP CBG: No results for input(s): GLUCAP in the last 168 hours.  Time coordinating discharge: 40 minutes  Signed:  [name]  APRN, MSN, FNP-C [facility] [address] [place], [place] [address] [phone]  [facility] 08/04/2019, 12:58 PM

## 2019-08-08 ENCOUNTER — Other Ambulatory Visit: Payer: Self-pay

## 2019-08-08 ENCOUNTER — Emergency Department (HOSPITAL_COMMUNITY): Payer: Medicare Other

## 2019-08-08 ENCOUNTER — Encounter (HOSPITAL_COMMUNITY): Payer: Self-pay | Admitting: Emergency Medicine

## 2019-08-08 ENCOUNTER — Emergency Department (HOSPITAL_COMMUNITY)
Admission: EM | Admit: 2019-08-08 | Discharge: 2019-08-08 | Disposition: A | Discharge status: Home / Self Care | Payer: Medicare Other | Attending: Emergency Medicine | Admitting: Emergency Medicine

## 2019-08-08 DIAGNOSIS — Z79899 Other long term (current) drug therapy: Secondary | ICD-10-CM | POA: Insufficient documentation

## 2019-08-08 DIAGNOSIS — Z96649 Presence of unspecified artificial hip joint: Secondary | ICD-10-CM | POA: Insufficient documentation

## 2019-08-08 DIAGNOSIS — J45909 Unspecified asthma, uncomplicated: Secondary | ICD-10-CM | POA: Insufficient documentation

## 2019-08-08 DIAGNOSIS — D5700 Hb-SS disease with crisis, unspecified: Secondary | ICD-10-CM | POA: Insufficient documentation

## 2019-08-08 DIAGNOSIS — R079 Chest pain, unspecified: Secondary | ICD-10-CM | POA: Diagnosis present

## 2019-08-08 LAB — COMPREHENSIVE METABOLIC PANEL
ALT: 32 U/L (ref 0–44)
AST: 43 U/L — ABNORMAL HIGH (ref 15–41)
Albumin: 4 g/dL (ref 3.5–5.0)
Alkaline Phosphatase: 56 U/L (ref 38–126)
Anion gap: 10 (ref 5–15)
BUN: <5 mg/dL — ABNORMAL LOW (ref 6–20)
CO2: 22 mmol/L (ref 22–32)
Calcium: 9 mg/dL (ref 8.9–10.3)
Chloride: 105 mmol/L (ref 98–111)
Creatinine, Ser: 0.4 mg/dL — ABNORMAL LOW (ref 0.44–1.00)
GFR calc Af Amer: >60 mL/min (ref >60)
GFR calc non Af Amer: >60 mL/min (ref >60)
Glucose, Bld: 103 mg/dL — ABNORMAL HIGH (ref 70–99)
Potassium: 3.4 mmol/L — ABNORMAL LOW (ref 3.5–5.1)
Sodium: 137 mmol/L (ref 135–145)
Total Bilirubin: 4.8 mg/dL — ABNORMAL HIGH (ref 0.3–1.2)
Total Protein: 7.3 g/dL (ref 6.5–8.1)

## 2019-08-08 LAB — CBC WITH DIFFERENTIAL/PLATELET
Abs Immature Granulocytes: 0.1 10*3/uL — ABNORMAL HIGH (ref 0.00–0.07)
Basophils Absolute: 0.1 10*3/uL (ref 0.0–0.1)
Basophils Relative: 1 %
Eosinophils Absolute: 0.3 10*3/uL (ref 0.0–0.5)
Eosinophils Relative: 2 %
HCT: 22.8 % — ABNORMAL LOW (ref 36.0–46.0)
Hemoglobin: 7.4 g/dL — ABNORMAL LOW (ref 12.0–15.0)
Immature Granulocytes: 1 %
Lymphocytes Relative: 15 %
Lymphs Abs: 2.3 10*3/uL (ref 0.7–4.0)
MCH: 28.1 pg (ref 26.0–34.0)
MCHC: 32.5 g/dL (ref 30.0–36.0)
MCV: 86.7 fL (ref 80.0–100.0)
Monocytes Absolute: 2 10*3/uL — ABNORMAL HIGH (ref 0.1–1.0)
Monocytes Relative: 13 %
Neutro Abs: 10 10*3/uL — ABNORMAL HIGH (ref 1.7–7.7)
Neutrophils Relative %: 68 %
Platelets: 603 10*3/uL — ABNORMAL HIGH (ref 150–400)
RBC: 2.63 MIL/uL — ABNORMAL LOW (ref 3.87–5.11)
RDW: 20.9 % — ABNORMAL HIGH (ref 11.5–15.5)
WBC: 14.7 10*3/uL — ABNORMAL HIGH (ref 4.0–10.5)
nRBC: 0.3 % — ABNORMAL HIGH (ref 0.0–0.2)

## 2019-08-08 LAB — RETICULOCYTES
Immature Retic Fract: 34.9 % — ABNORMAL HIGH (ref 2.3–15.9)
RBC.: 2.59 MIL/uL — ABNORMAL LOW (ref 3.87–5.11)
Retic Count, Absolute: 283.9 10*3/uL — ABNORMAL HIGH (ref 19.0–186.0)
Retic Ct Pct: 11 % — ABNORMAL HIGH (ref 0.4–3.1)

## 2019-08-08 LAB — I-STAT BETA HCG BLOOD, ED (MC, WL, AP ONLY): I-stat hCG, quantitative: <5 m[IU]/mL (ref <5)

## 2019-08-08 MED ORDER — GENERIC EXTERNAL MEDICATION
Status: DC
Start: ? — End: 2019-08-08

## 2019-08-08 MED ORDER — DIPHENHYDRAMINE HCL 25 MG PO CAPS
25.00 | ORAL_CAPSULE | ORAL | Status: DC
Start: ? — End: 2019-08-08

## 2019-08-08 MED ORDER — ALBUTEROL SULFATE HFA 108 (90 BASE) MCG/ACT IN AERS
2.00 | INHALATION_SPRAY | RESPIRATORY_TRACT | Status: DC
Start: ? — End: 2019-08-08

## 2019-08-08 MED ORDER — MELATONIN 3 MG PO TABS
3.00 | ORAL_TABLET | ORAL | Status: DC
Start: ? — End: 2019-08-08

## 2019-08-08 MED ORDER — CHOLECALCIFEROL 25 MCG (1000 UT) PO TABS
1000.00 | ORAL_TABLET | ORAL | Status: DC
Start: 2019-08-09 — End: 2019-08-08

## 2019-08-08 MED ORDER — ACETAMINOPHEN 325 MG PO TABS
975.00 | ORAL_TABLET | ORAL | Status: DC
Start: 2019-08-09 — End: 2019-08-08

## 2019-08-08 MED ORDER — SENNOSIDES-DOCUSATE SODIUM 8.6-50 MG PO TABS
2.00 | ORAL_TABLET | ORAL | Status: DC
Start: 2019-08-09 — End: 2019-08-08

## 2019-08-08 MED ORDER — HYDROMORPHONE HCL 1 MG/ML IJ SOLN
2.0000 mg | INTRAMUSCULAR | Status: AC
  Administered 2019-08-08: 2 mg via INTRAVENOUS
  Filled 2019-08-08: qty 2

## 2019-08-08 MED ORDER — DEXTROSE-NACL 5-0.45 % IV SOLN: INTRAVENOUS | Status: DC

## 2019-08-08 MED ORDER — CYANOCOBALAMIN 500 MCG PO TABS
1000.00 | ORAL_TABLET | ORAL | Status: DC
Start: 2019-08-09 — End: 2019-08-08

## 2019-08-08 MED ORDER — SODIUM CHLORIDE 0.9% FLUSH
10.0000 mL | Freq: Two times a day (BID) | INTRAVENOUS | Status: DC
  Administered 2019-08-08: 10 mL

## 2019-08-08 MED ORDER — HEPARIN SOD (PORK) LOCK FLUSH 100 UNIT/ML IV SOLN
500.0000 [IU] | Freq: Once | INTRAVENOUS | Status: AC
  Administered 2019-08-08: 500 [IU]
  Filled 2019-08-08: qty 5

## 2019-08-08 MED ORDER — DIPHENHYDRAMINE HCL 25 MG PO CAPS
25.0000 mg | ORAL_CAPSULE | ORAL | Status: DC | PRN
  Administered 2019-08-08: 50 mg via ORAL
  Filled 2019-08-08: qty 2

## 2019-08-08 MED ORDER — FOLIC ACID 1 MG PO TABS
1.00 | ORAL_TABLET | ORAL | Status: DC
Start: 2019-08-09 — End: 2019-08-08

## 2019-08-08 MED ORDER — PANTOPRAZOLE SODIUM 20 MG PO TBEC
20.00 | DELAYED_RELEASE_TABLET | ORAL | Status: DC
Start: 2019-08-09 — End: 2019-08-08

## 2019-08-08 MED ORDER — HYDROMORPHONE HCL-NACL 30-0.9 MG/30ML-% IJ SOSY
0.60 | PREFILLED_SYRINGE | INTRAMUSCULAR | Status: DC
Start: ? — End: 2019-08-08

## 2019-08-08 MED ORDER — POLYETHYLENE GLYCOL 3350 17 GM/SCOOP PO POWD
17.00 | ORAL | Status: DC
Start: ? — End: 2019-08-08

## 2019-08-08 MED ORDER — MIRTAZAPINE 15 MG PO TABS
15.00 | ORAL_TABLET | ORAL | Status: DC
Start: 2019-08-08 — End: 2019-08-08

## 2019-08-08 MED ORDER — OXYCODONE-ACETAMINOPHEN 5-325 MG PO TABS
2.0000 | ORAL_TABLET | Freq: Once | ORAL | Status: AC
  Administered 2019-08-08: 23:00:00 2 via ORAL
  Filled 2019-08-08: qty 2

## 2019-08-08 MED ORDER — RIVAROXABAN 20 MG PO TABS
20.00 | ORAL_TABLET | ORAL | Status: DC
Start: 2019-08-09 — End: 2019-08-08

## 2019-08-08 MED ORDER — LIDOCAINE HCL 1 % IJ SOLN
0.50 | INTRAMUSCULAR | Status: DC
Start: ? — End: 2019-08-08

## 2019-08-08 MED ORDER — OXYCODONE HCL 5 MG PO TABS
10.00 | ORAL_TABLET | ORAL | Status: DC
Start: ? — End: 2019-08-08

## 2019-08-08 MED ORDER — CHLORHEXIDINE GLUCONATE CLOTH 2 % EX PADS: 6.0000 | MEDICATED_PAD | Freq: Every day | CUTANEOUS | Status: DC

## 2019-08-08 MED ORDER — KETOROLAC TROMETHAMINE 15 MG/ML IJ SOLN
15.0000 mg | Freq: Once | INTRAMUSCULAR | Status: AC
  Administered 2019-08-08: 15 mg via INTRAVENOUS
  Filled 2019-08-08: qty 1

## 2019-08-08 MED ORDER — KETAMINE HCL 50 MG/5ML IJ SOSY: 0.3000 mg/kg | PREFILLED_SYRINGE | Freq: Once | INTRAMUSCULAR | Status: DC

## 2019-08-08 MED ORDER — DEFERASIROX 360 MG PO TABS
1080.00 | ORAL_TABLET | ORAL | Status: DC
Start: 2019-08-09 — End: 2019-08-08

## 2019-08-08 NOTE — ED Triage Notes (Signed)
Patient c/o central chest pain, non radiating x 3-4 days. Reports recent admission for sickle cell crisis. Took home medications w/o relief.

## 2019-08-08 NOTE — ED Notes (Signed)
Discharge instructions discussed with pt. Pt verbalized understanding with no questions at this time. Pt to go home with brother [place].

## 2019-08-08 NOTE — ED Notes (Signed)
Discussed plan of care for pain management. Pt states she has had previous reaction to ketamine. States she becomes flushed, heart races, dizzy, and feels like she is going to pass out.   Dr. Reather Converse informed.

## 2019-08-08 NOTE — ED Provider Notes (Signed)
[facility] EMERGENCY DEPARTMENT Provider Note   CSN: [id number] Arrival date & time: 08/08/19  1844     History Chief Complaint  Patient presents with  . Chest Pain  . Sickle Cell Pain Crisis    Jestiny Krichbaum is a 39 y.o. female.  The history is provided by the patient and medical records.  Sickle Cell Pain Crisis Location:  Chest Severity:  Severe Onset quality:  Gradual Duration:  2 days Similar to previous crisis episodes: yes   Timing:  Constant Progression:  Unchanged Chronicity:  Recurrent Frequency of attacks:  Frequent History of pulmonary emboli: yes   Context: not change in medication, not infection and not non-compliance   Relieved by:  Nothing Worsened by:  Nothing (Not pleuritic, not exertional) Ineffective treatments: Oxycodone. Associated symptoms: chest pain   Associated symptoms: no congestion, no cough, no fever, no headaches, no nausea, no shortness of breath, no swelling of legs and no vomiting   Risk factors: frequent admissions for pain, frequent pain crises, hx of pneumonia and prior acute chest        Past Medical History:  Diagnosis Date  . Asthma   . Eczema   . History of pulmonary embolus (PE)   . Sickle cell anemia [facility])     Patient Active Problem List   Diagnosis Date Noted  . Sickle cell crisis acute chest syndrome ([place]) 07/29/2019  . Drug-seeking behavior 07/07/2019  . Sinus tachycardia 07/07/2019  . Therapeutic opioid-induced constipation (OIC) 07/07/2019  . Breast mass in female 06/29/2019  . Atelectasis of right lung 06/29/2019  . Sickle cell crisis ([place]) 06/27/2019  . Sickle cell pain crisis ([place]) 04/19/2019  . Pneumonia 03/27/2019  . 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 Anterior chest wall pain 11/13/2018  . Epigastric abdominal pain 11/13/2018  . Hematuria 11/13/2018  . Hyperbilirubinemia 11/12/2018  . Long term current use of anticoagulant therapy 11/12/2018  . History of pulmonary embolism  09/26/2018  . Acute pulmonary embolism ([place]) 09/26/2018  . Opioid dependence ([place]) 09/13/2018  . Port-A-Cath in place 09/13/2018  . Narcotic abuse, continuous ([place]) 08/28/2018  . Moderate persistent asthma without complication [id number]  . Anemia 07/03/2018  . Personal history of other venous thrombosis and embolism 07/03/2018  . History of transfusion 07/03/2018  . Gastro-esophageal reflux disease without esophagitis 06/02/2018  . Asthma 05/19/2018  . Anxiety and depression 10/30/2017  . At risk for sepsis 06/13/2017  . Mitral regurgitation 02/01/2014  . Itching 12/13/2013  . Nausea & vomiting 12/13/2013  . 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 Frequent complaints of pain 11/01/2013  . Hypokalemia 09/19/2013  . S/P total hip arthroplasty 05/19/2013  . 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 Lower urinary tract infectious disease 03/02/2013  . Chest pain 11/14/2012  . Sickle-cell anemia ([place]) 10/30/2012  . Pain management 08/01/2012  . Reticulocytosis 07/31/2012  . 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 Leukocytosis 03/27/2012  . History of gestational diabetes 10/07/2011  . 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 CHOLECYSTECTOMY    . ERCP    . JOINT REPLACEMENT    . PORTA CATH INSERTION    . TUBAL LIGATION    . WISDOM TOOTH EXTRACTION       OB History   No obstetric history on file.     Family  History  Problem Relation Age of Onset  . Renal Disease Mother   . Hypertension Mother   . High Cholesterol Mother     Social History   Tobacco Use  . Smoking status: Never Smoker  . Smokeless tobacco: Never Used  Substance Use Topics  . Alcohol use: Never  . Drug use: Never    Home Medications Prior to Admission medications     Medication Sig Start Date End Date Taking? Authorizing Provider  albuterol (VENTOLIN HFA) 108 (90 Base) MCG/ACT inhaler Inhale 2 puffs into the lungs 2 (two) times daily as needed for wheezing or shortness of breath. 12/31/16  Yes [provider]  Cholecalciferol (VITAMIN D3) 25 MCG (1000 UT) CAPS Take 1,000 Units by mouth daily.   Yes [provider]  Deferasirox (JADENU) 360 MG TABS Take 1,080 mg by mouth daily before breakfast. 08/08/19 08/07/20 Yes [provider]  diphenhydrAMINE (BENADRYL) 25 mg capsule Take 25 mg by mouth 3 (three) times daily as needed for itching.   Yes [provider]  folic acid (FOLVITE) 1 MG tablet Take 1 mg by mouth daily. 12/31/16  Yes [provider]  mirtazapine (REMERON) 15 MG tablet Take 45 mg by mouth at bedtime.  08/08/19  Yes [provider]  mometasone-formoterol (DULERA) 100-5 MCG/ACT AERO Inhale 2 puffs into the lungs daily as needed for wheezing or shortness of breath.   Yes [provider]  omeprazole (PRILOSEC) 20 MG capsule Take 20 mg by mouth 2 (two) times a day. 09/08/18  Yes [provider]  Oxycodone HCl 10 MG TABS Take 1 tablet (10 mg total) by mouth every 4 (four) hours as needed (pain). Every 4 hours while awake Patient taking differently: Take 10 mg by mouth See admin instructions. Take 10 mg by mouth every four to six hours as needed for pain 08/04/19  Yes [name], FNP  promethazine (PHENERGAN) 25 MG tablet Take 25 mg by mouth every 8 (eight) hours as needed for nausea/vomiting. 06/22/19  Yes [provider]  vitamin B-12 (CYANOCOBALAMIN) 1000 MCG tablet Take 1,000 mcg by mouth daily.   Yes [provider]  XARELTO 20 MG TABS tablet Take 1 tablet (20 mg total) by mouth daily. 05/14/19  Yes [name], MD  oxyCODONE (OXYCONTIN) 10 mg 12 hr tablet Take 1 tablet (10 mg total) by mouth every 12 (twelve) hours. Patient not taking: Reported on  08/08/2019 08/04/19   [name], FNP    Allergies    Cefaclor, Hydroxyurea, and Ketamine  Review of Systems   Review of Systems  Constitutional: Negative for fever.  HENT: Negative for congestion.   Respiratory: Negative for cough and shortness of breath.   Cardiovascular: Positive for chest pain.  Gastrointestinal: Negative for nausea and vomiting.  Neurological: Negative for headaches.  All other systems reviewed and are negative.   Physical Exam Updated Vital Signs BP 114/77   Pulse 73   Temp 98.9 F (37.2 C) (Oral)   Resp 15   Ht 5\' 3"  (1.6 m)   Wt 56.2 kg   LMP 07/18/2019 (Approximate)   SpO2 98%   BMI 21.97 kg/m   Physical Exam Vitals and nursing note reviewed.  Constitutional:      Appearance: She is well-developed. She is not toxic-appearing or diaphoretic.  HENT:     Head: Normocephalic and atraumatic.  Eyes:     Conjunctiva/sclera: Conjunctivae normal.  Cardiovascular:     Rate and Rhythm: Normal rate  and regular rhythm.     Pulses:          Radial pulses are 2+ on the right side and 2+ on the left side.       Dorsalis pedis pulses are 2+ on the right side and 2+ on the left side.     Heart sounds: No murmur.  Pulmonary:     Effort: Pulmonary effort is normal. No respiratory distress.     Breath sounds: Normal breath sounds. No wheezing, rhonchi or rales.  Chest:     Chest wall: No tenderness.  Abdominal:     Palpations: Abdomen is soft.     Tenderness: There is no abdominal tenderness.  Musculoskeletal:     Cervical back: Neck supple.     Right lower leg: No tenderness. No edema.     Left lower leg: No tenderness. No edema.  Skin:    General: Skin is warm and dry.  Neurological:     General: No focal deficit present.     Mental Status: She is alert.     ED Results / Procedures / Treatments   Labs (all labs ordered are listed, but only abnormal results are displayed) Labs Reviewed  CBC WITH DIFFERENTIAL/PLATELET - Abnormal; Notable  for the following components:      Result Value   WBC 14.7 (*)    RBC 2.63 (*)    Hemoglobin 7.4 (*)    HCT 22.8 (*)    RDW 20.9 (*)    Platelets 603 (*)    nRBC 0.3 (*)    Neutro Abs 10.0 (*)    Monocytes Absolute 2.0 (*)    Abs Immature Granulocytes 0.10 (*)    All other components within normal limits  RETICULOCYTES - Abnormal; Notable for the following components:   Retic Ct Pct 11.0 (*)    RBC. 2.59 (*)    Retic Count, Absolute 283.9 (*)    Immature Retic Fract 34.9 (*)    All other components within normal limits  COMPREHENSIVE METABOLIC PANEL - Abnormal; Notable for the following components:   Potassium 3.4 (*)    Glucose, Bld 103 (*)    BUN <5 (*)    Creatinine, Ser 0.40 (*)    AST 43 (*)    Total Bilirubin 4.8 (*)    All other components within normal limits  I-STAT BETA HCG BLOOD, ED ([facility], [facility], [facility] ONLY)   Radiology DG Chest 2 View  Result Date: 08/08/2019 CLINICAL DATA:  Sickle cell, chest pain nonradiating for 4 days EXAM: CHEST - 2 VIEW COMPARISON:  07/28/2019 FINDINGS: Frontal and lateral views of the chest demonstrates stable chest wall port. Cardiac silhouette is borderline but stable. Diffuse interstitial scarring throughout the lungs compatible with sickle cell disease. No focal consolidation, effusion, or pneumothorax. There are no acute bony abnormalities. IMPRESSION: 1. Stable exam, no acute process. Electronically Signed   By: Randa Ngo M.D.   On: 08/08/2019 19:57    Procedures Procedures (including critical care time)  Medications Ordered in ED Medications  diphenhydrAMINE (BENADRYL) capsule 25-50 mg (50 mg Oral Given 08/08/19 2239)  dextrose 5 %-0.45 % sodium chloride infusion ( Intravenous Stopped 08/08/19 2313)  sodium chloride flush (NS) 0.9 % injection 10-40 mL (10 mLs Intracatheter Given 08/08/19 2027)  Chlorhexidine Gluconate Cloth 2 % PADS 6 each (0 each Topical Hold 08/08/19 2026)  HYDROmorphone (DILAUDID) injection 2 mg (2 mg Intravenous  Given 08/08/19 2017)  HYDROmorphone (DILAUDID) injection 2 mg (2 mg Intravenous Given  08/08/19 2054)  ketorolac (TORADOL) 15 MG/ML injection 15 mg (15 mg Intravenous Given 08/08/19 2017)  oxyCODONE-acetaminophen (PERCOCET/ROXICET) 5-325 MG per tablet 2 tablet (2 tablets Oral Given 08/08/19 2239)  heparin lock flush 100 unit/mL (500 Units Intracatheter Given 08/08/19 2310)    ED Course  I have reviewed the triage vital signs and the nursing notes.  Pertinent labs & imaging results that were available during my care of the patient were reviewed by me and considered in my medical decision making (see chart for details).  MDM Rules/Calculators/A&P                      The patient is a 39 year old female with past medical history of sickle cell disease, PE on Xarelto, acute chest syndrome who presents to the ED for chest pain.  The patient has been admitted several times recently for the same, had a negative CTA chest PE study 3 days ago, her pain is exactly similar to her previous sickle cell pain crises, and does not feel similar to PE or acute chest in the past.  I do not suspect PE, ACS, dissection, pneumothorax, pneumonia, as these are not consistent with her clinical picture at this time.  Chest x-ray without emergent changes, labs consistent with previous values, pain improved after Dilaudid, Toradol and Percocet in the emergency department.  The patient is feeling better and is comfortable with discharge at this time, she will call her hematologist in the morning.  Strict return precautions provided, discharged in stable condition.   Final Clinical Impression(s) / ED Diagnoses Final diagnoses:  Sickle cell pain crisis [facility])    Rx / [place] Orders ED Discharge Orders    None       Lisset Ketchem, [place], MD 08/09/19 [id number]    Elnora Morrison, MD 08/09/19 [phone]

## 2019-08-08 NOTE — ED Notes (Signed)
IV team consult placed for port access

## 2019-08-10 MED ORDER — GENERIC EXTERNAL MEDICATION
Status: DC
Start: ? — End: 2019-08-10

## 2019-08-14 ENCOUNTER — Emergency Department (HOSPITAL_COMMUNITY)
Admission: EM | Admit: 2019-08-14 | Discharge: 2019-08-14 | Disposition: A | Discharge status: Home / Self Care | Payer: Medicare Other | Attending: Emergency Medicine | Admitting: Emergency Medicine

## 2019-08-14 ENCOUNTER — Encounter (HOSPITAL_COMMUNITY): Payer: Self-pay | Admitting: *Deleted

## 2019-08-14 ENCOUNTER — Emergency Department (HOSPITAL_COMMUNITY): Payer: Medicare Other

## 2019-08-14 ENCOUNTER — Other Ambulatory Visit: Payer: Self-pay

## 2019-08-14 DIAGNOSIS — Z7901 Long term (current) use of anticoagulants: Secondary | ICD-10-CM | POA: Insufficient documentation

## 2019-08-14 DIAGNOSIS — D5700 Hb-SS disease with crisis, unspecified: Secondary | ICD-10-CM | POA: Diagnosis present

## 2019-08-14 DIAGNOSIS — Z20822 Contact with and (suspected) exposure to covid-19: Secondary | ICD-10-CM | POA: Diagnosis not present

## 2019-08-14 DIAGNOSIS — E860 Dehydration: Secondary | ICD-10-CM | POA: Diagnosis not present

## 2019-08-14 DIAGNOSIS — Z86711 Personal history of pulmonary embolism: Secondary | ICD-10-CM | POA: Insufficient documentation

## 2019-08-14 DIAGNOSIS — R5383 Other fatigue: Secondary | ICD-10-CM | POA: Diagnosis not present

## 2019-08-14 LAB — URINALYSIS, ROUTINE W REFLEX MICROSCOPIC
Bacteria, UA: NONE SEEN
Bilirubin Urine: NEGATIVE
Glucose, UA: NEGATIVE mg/dL
Ketones, ur: NEGATIVE mg/dL
Leukocytes,Ua: NEGATIVE
Nitrite: NEGATIVE
Protein, ur: 30 mg/dL — AB
Specific Gravity, Urine: 1.011 (ref 1.005–1.030)
pH: 7 (ref 5.0–8.0)

## 2019-08-14 LAB — CBC WITH DIFFERENTIAL/PLATELET
Abs Immature Granulocytes: 0.14 10*3/uL — ABNORMAL HIGH (ref 0.00–0.07)
Basophils Absolute: 0.1 10*3/uL (ref 0.0–0.1)
Basophils Relative: 1 %
Eosinophils Absolute: 0.2 10*3/uL (ref 0.0–0.5)
Eosinophils Relative: 1 %
HCT: 24.9 % — ABNORMAL LOW (ref 36.0–46.0)
Hemoglobin: 8 g/dL — ABNORMAL LOW (ref 12.0–15.0)
Immature Granulocytes: 1 %
Lymphocytes Relative: 18 %
Lymphs Abs: 3.2 10*3/uL (ref 0.7–4.0)
MCH: 28.5 pg (ref 26.0–34.0)
MCHC: 32.1 g/dL (ref 30.0–36.0)
MCV: 88.6 fL (ref 80.0–100.0)
Monocytes Absolute: 2.5 10*3/uL — ABNORMAL HIGH (ref 0.1–1.0)
Monocytes Relative: 14 %
Neutro Abs: 12.1 10*3/uL — ABNORMAL HIGH (ref 1.7–7.7)
Neutrophils Relative %: 65 %
Platelets: 562 10*3/uL — ABNORMAL HIGH (ref 150–400)
RBC: 2.81 MIL/uL — ABNORMAL LOW (ref 3.87–5.11)
RDW: 21.3 % — ABNORMAL HIGH (ref 11.5–15.5)
WBC: 18.2 10*3/uL — ABNORMAL HIGH (ref 4.0–10.5)
nRBC: 0.4 % — ABNORMAL HIGH (ref 0.0–0.2)

## 2019-08-14 LAB — COMPREHENSIVE METABOLIC PANEL
ALT: 31 U/L (ref 0–44)
AST: 37 U/L (ref 15–41)
Albumin: 4.5 g/dL (ref 3.5–5.0)
Alkaline Phosphatase: 73 U/L (ref 38–126)
Anion gap: 7 (ref 5–15)
BUN: 6 mg/dL (ref 6–20)
CO2: 23 mmol/L (ref 22–32)
Calcium: 9.1 mg/dL (ref 8.9–10.3)
Chloride: 108 mmol/L (ref 98–111)
Creatinine, Ser: 0.4 mg/dL — ABNORMAL LOW (ref 0.44–1.00)
GFR calc Af Amer: >60 mL/min (ref >60)
GFR calc non Af Amer: >60 mL/min (ref >60)
Glucose, Bld: 105 mg/dL — ABNORMAL HIGH (ref 70–99)
Potassium: 3.3 mmol/L — ABNORMAL LOW (ref 3.5–5.1)
Sodium: 138 mmol/L (ref 135–145)
Total Bilirubin: 3.3 mg/dL — ABNORMAL HIGH (ref 0.3–1.2)
Total Protein: 8.5 g/dL — ABNORMAL HIGH (ref 6.5–8.1)

## 2019-08-14 LAB — POC SARS CORONAVIRUS 2 AG -  ED: SARS Coronavirus 2 Ag: NEGATIVE

## 2019-08-14 LAB — RETICULOCYTES
Immature Retic Fract: 29 % — ABNORMAL HIGH (ref 2.3–15.9)
RBC.: 2.77 MIL/uL — ABNORMAL LOW (ref 3.87–5.11)
Retic Count, Absolute: 387.5 10*3/uL — ABNORMAL HIGH (ref 19.0–186.0)
Retic Ct Pct: 13.4 % — ABNORMAL HIGH (ref 0.4–3.1)

## 2019-08-14 LAB — I-STAT BETA HCG BLOOD, ED (MC, WL, AP ONLY): I-stat hCG, quantitative: <5 m[IU]/mL (ref <5)

## 2019-08-14 MED ORDER — HYDROMORPHONE HCL 2 MG/ML IJ SOLN
2.0000 mg | INTRAMUSCULAR | Status: AC
  Administered 2019-08-14: 2 mg via INTRAVENOUS
  Filled 2019-08-14: qty 1

## 2019-08-14 MED ORDER — KETOROLAC TROMETHAMINE 15 MG/ML IJ SOLN
15.0000 mg | INTRAMUSCULAR | Status: AC
  Administered 2019-08-14: 19:00:00 15 mg via INTRAVENOUS
  Filled 2019-08-14: qty 1

## 2019-08-14 MED ORDER — HYDROMORPHONE HCL 2 MG/ML IJ SOLN
2.0000 mg | INTRAMUSCULAR | Status: AC
  Administered 2019-08-14: 19:00:00 2 mg via INTRAVENOUS
  Filled 2019-08-14: qty 1

## 2019-08-14 MED ORDER — DIPHENHYDRAMINE HCL 25 MG PO CAPS
25.0000 mg | ORAL_CAPSULE | ORAL | Status: DC | PRN
  Administered 2019-08-14: 19:00:00 25 mg via ORAL
  Filled 2019-08-14: qty 1

## 2019-08-14 MED ORDER — HEPARIN SOD (PORK) LOCK FLUSH 100 UNIT/ML IV SOLN
500.0000 [IU] | Freq: Once | INTRAVENOUS | Status: AC
  Administered 2019-08-14: 21:00:00 500 [IU]
  Filled 2019-08-14: qty 5

## 2019-08-14 MED ORDER — OXYCODONE HCL 5 MG PO TABS
15.0000 mg | ORAL_TABLET | Freq: Once | ORAL | Status: AC
  Administered 2019-08-14: 20:00:00 15 mg via ORAL
  Filled 2019-08-14: qty 3

## 2019-08-14 MED ORDER — DEXTROSE-NACL 5-0.45 % IV SOLN: INTRAVENOUS | Status: DC

## 2019-08-14 MED ORDER — ONDANSETRON HCL 4 MG/2ML IJ SOLN
4.0000 mg | INTRAMUSCULAR | Status: DC | PRN
  Administered 2019-08-14: 4 mg via INTRAVENOUS
  Filled 2019-08-14: qty 2

## 2019-08-14 MED ORDER — PROMETHAZINE HCL 25 MG RE SUPP: 25.0000 mg | Freq: Four times a day (QID) | RECTAL | 0 refills | Status: DC | PRN

## 2019-08-14 MED ORDER — SODIUM CHLORIDE 0.9% FLUSH: 3.0000 mL | Freq: Once | INTRAVENOUS | Status: DC

## 2019-08-14 NOTE — ED Triage Notes (Signed)
Pt having SCC pain in back.

## 2019-08-14 NOTE — Discharge Instructions (Signed)
Please follow-up with your primary care doctor with whom you receive sickle cell care.  Please drink plenty of water.  I am prescribing Phenergan suppositories to use as needed.

## 2019-08-14 NOTE — ED Notes (Signed)
Patient requesting Benadryl IV. PA made aware. Per PA order, patient offered additional 25mg  Benadryl. Patient refused.

## 2019-08-14 NOTE — ED Provider Notes (Signed)
[facility] DEPT Provider Note   CSN: [id number] Arrival date & time: 08/14/19  1640     History Chief Complaint  Patient presents with  . Sickle Cell Pain Crisis    Molly Gutierrez is a 39 y.o. female.  HPI Patient presents for severe bilateral thigh pain, low back pain, nausea and one episode of vomiting.  Patient states that her pain is severe, nonradiating, achy, constant, recurrent began yesterday evening.  She states that she is used her oxycodone without any improvement in her pain.  She denies any congestion, cough, fevers, headaches, shortness of breath, leg swelling, chest pain, urinary symptoms, diarrhea or abdominal pain.  She states that she is otherwise feeling well with no other systemic symptoms.  She denies any known Covid exposure. She states that she vomited once this morning which was nonbloody nonbilious.  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  She states that she was taken off her Xarelto after her first PE and then had another one.  She has been on chronic long-term anticoagulation since that time.  She denies any missed doses.  She states that her triggers for her sickle cell pain is weather changes and stress.  She states that both of these have likely triggered her symptoms today.  Patient states that she has had to have transfusions in the past due to symptomatic anemia.  She does endorse some mild dyspnea on exertion but states that this has been ongoing for some time.     Past Medical History:  Diagnosis Date  . Asthma   . Eczema   . History of pulmonary embolus (PE)   . Sickle cell anemia [facility])     Patient Active Problem List   Diagnosis Date Noted  . Sickle cell crisis acute chest syndrome ([place]) 07/29/2019  . Drug-seeking behavior 07/07/2019  . Sinus tachycardia 07/07/2019  . Therapeutic opioid-induced constipation (OIC) 07/07/2019  . Breast mass in female 06/29/2019  . Atelectasis of  right lung 06/29/2019  . Sickle cell crisis ([place]) 06/27/2019  . Sickle cell pain crisis ([place]) 04/19/2019  . Pneumonia 03/27/2019  . 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 Anterior chest wall pain 11/13/2018  . Epigastric abdominal pain 11/13/2018  . Hematuria 11/13/2018  . Hyperbilirubinemia 11/12/2018  . Long term current use of anticoagulant therapy 11/12/2018  . History of pulmonary embolism 09/26/2018  . Acute pulmonary embolism ([place]) 09/26/2018  . Opioid dependence ([place]) 09/13/2018  . Port-A-Cath in place 09/13/2018  . Narcotic abuse, continuous ([place]) 08/28/2018  . Moderate persistent asthma without complication [id number]  . Anemia 07/03/2018  . Personal history of other venous thrombosis and embolism 07/03/2018  . History of transfusion 07/03/2018  . Gastro-esophageal reflux disease without esophagitis 06/02/2018  . Asthma 05/19/2018  . Anxiety and depression 10/30/2017  . At risk for sepsis 06/13/2017  . Mitral regurgitation 02/01/2014  . Itching 12/13/2013  . Nausea & vomiting 12/13/2013  . 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 Frequent complaints of pain 11/01/2013  . Hypokalemia 09/19/2013  . S/P total hip arthroplasty 05/19/2013  . 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 Lower urinary tract infectious disease 03/02/2013  . Chest pain 11/14/2012  . Sickle-cell anemia ([place]) 10/30/2012  . Pain management 08/01/2012  . Reticulocytosis 07/31/2012  . 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 Leukocytosis 03/27/2012  . History of  gestational diabetes 10/07/2011  . 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 CHOLECYSTECTOMY    . ERCP    . JOINT REPLACEMENT    . PORTA CATH INSERTION    . TUBAL LIGATION    . WISDOM TOOTH EXTRACTION       OB  History   No obstetric history on file.     Family History  Problem Relation Age of Onset  . Renal Disease Mother   . Hypertension Mother   . High Cholesterol Mother     Social History   Tobacco Use  . Smoking status: Never Smoker  . Smokeless tobacco: Never Used  Substance Use Topics  . Alcohol use: Never  . Drug use: Never    Home Medications Prior to Admission medications   Medication Sig Start Date End Date Taking? Authorizing Provider  albuterol (VENTOLIN HFA) 108 (90 Base) MCG/ACT inhaler Inhale 2 puffs into the lungs 2 (two) times daily as needed for wheezing or shortness of breath. 12/31/16   [provider]  Cholecalciferol (VITAMIN D3) 25 MCG (1000 UT) CAPS Take 1,000 Units by mouth daily.    [provider]  Deferasirox (JADENU) 360 MG TABS Take 1,080 mg by mouth daily before breakfast. 08/08/19 08/07/20  [provider]  diphenhydrAMINE (BENADRYL) 25 mg capsule Take 25 mg by mouth 3 (three) times daily as needed for itching.    [provider]  folic acid (FOLVITE) 1 MG tablet Take 1 mg by mouth daily. 12/31/16   [provider]  mirtazapine (REMERON) 15 MG tablet Take 45 mg by mouth at bedtime.  08/08/19   [provider]  mometasone-formoterol (DULERA) 100-5 MCG/ACT AERO Inhale 2 puffs into the lungs daily as needed for wheezing or shortness of breath.    [provider]  omeprazole (PRILOSEC) 20 MG capsule Take 20 mg by mouth 2 (two) times a day. 09/08/18   [provider]  oxyCODONE (OXYCONTIN) 10 mg 12 hr tablet Take 1 tablet (10 mg total) by mouth every 12 (twelve) hours. Patient not taking: Reported on 08/08/2019 08/04/19   [name], FNP  Oxycodone HCl 10 MG TABS Take 1 tablet (10 mg total) by mouth every 4 (four) hours as needed (pain). Every 4 hours while awake Patient taking differently: Take 10 mg by mouth See admin instructions. Take 10 mg by mouth every four to six hours as needed for  pain 08/04/19   [name], FNP  promethazine (PHENERGAN) 25 MG suppository Place 1 suppository (25 mg total) rectally every 6 (six) hours as needed for nausea or vomiting. 08/14/19   Tedd Sias, PA  vitamin B-12 (CYANOCOBALAMIN) 1000 MCG tablet Take 1,000 mcg by mouth daily.    [provider]  XARELTO 20 MG TABS tablet Take 1 tablet (20 mg total) by mouth daily. 05/14/19   [name], MD    Allergies    Cefaclor, Hydroxyurea, and Ketamine  Review of Systems   Review of Systems  Constitutional: Positive for fatigue. Negative for chills and fever.  HENT: Negative for congestion.   Eyes: Negative for pain.  Respiratory: Negative for cough and shortness of breath.   Cardiovascular: Negative for chest pain and leg swelling.  Gastrointestinal: Negative for abdominal pain and vomiting.  Genitourinary: Negative for dysuria.  Musculoskeletal: Negative for myalgias.       Bilateral thigh pain and low back pain.  Skin: Negative  for rash.  Neurological: Negative for dizziness and headaches.    Physical Exam Updated Vital Signs BP 102/78   Pulse 72   Temp 99.5 F (37.5 C) (Oral)   Resp 15   Ht 5\' 3"  (1.6 m)   Wt 56.2 kg   LMP 07/18/2019 (Approximate)   SpO2 96%   BMI 21.97 kg/m   Physical Exam Vitals and nursing note reviewed.  Constitutional:      General: She is not in acute distress. HENT:     Head: Normocephalic and atraumatic.     Nose: Nose normal.     Mouth/Throat:     Mouth: Mucous membranes are dry.  Eyes:     General: No scleral icterus. Cardiovascular:     Rate and Rhythm: Normal rate and regular rhythm.     Pulses: Normal pulses.     Heart sounds: Normal heart sounds.     Comments: HR 108 on my exam. RRR, no MRG Pulmonary:     Effort: Pulmonary effort is normal. No respiratory distress.     Breath sounds: No wheezing.  Abdominal:     Palpations: Abdomen is soft.     Tenderness: There is no abdominal tenderness. There is no  guarding or rebound.  Musculoskeletal:     Cervical back: Normal range of motion.     Right lower leg: No edema.     Left lower leg: No edema.  Skin:    General: Skin is warm and dry.     Capillary Refill: Capillary refill takes less than 2 seconds.  Neurological:     Mental Status: She is alert. Mental status is at baseline.  Psychiatric:        Mood and Affect: Mood normal.        Behavior: Behavior normal.     ED Results / Procedures / Treatments   Labs (all labs ordered are listed, but only abnormal results are displayed) Labs Reviewed  COMPREHENSIVE METABOLIC PANEL - Abnormal; Notable for the following components:      Result Value   Potassium 3.3 (*)    Glucose, Bld 105 (*)    Creatinine, Ser 0.40 (*)    Total Protein 8.5 (*)    Total Bilirubin 3.3 (*)    All other components within normal limits  CBC WITH DIFFERENTIAL/PLATELET - Abnormal; Notable for the following components:   WBC 18.2 (*)    RBC 2.81 (*)    Hemoglobin 8.0 (*)    HCT 24.9 (*)    RDW 21.3 (*)    Platelets 562 (*)    nRBC 0.4 (*)    Neutro Abs 12.1 (*)    Monocytes Absolute 2.5 (*)    Abs Immature Granulocytes 0.14 (*)    All other components within normal limits  RETICULOCYTES - Abnormal; Notable for the following components:   Retic Ct Pct 13.4 (*)    RBC. 2.77 (*)    Retic Count, Absolute 387.5 (*)    Immature Retic Fract 29.0 (*)    All other components within normal limits  URINALYSIS, ROUTINE W REFLEX MICROSCOPIC - Abnormal; Notable for the following components:   Hgb urine dipstick SMALL (*)    Protein, ur 30 (*)    All other components within normal limits  I-STAT BETA HCG BLOOD, ED (MC, [facility], AP ONLY)  POC SARS CORONAVIRUS 2 AG -  ED    EKG None  Radiology DG Chest Portable 1 View  Result Date: 08/14/2019 CLINICAL DATA:  39 year old female  with chest pain and sickle cell crisis. EXAM: PORTABLE CHEST 1 VIEW COMPARISON:  Chest radiograph dated 08/08/2019. FINDINGS: Bilateral  infusion ports appear in similar position. No focal consolidation, pleural effusion, or pneumothorax. The cardiac silhouette is within normal limits. No acute osseous pathology. IMPRESSION: No acute cardiopulmonary process. Electronically Signed   By: Anner Crete M.D.   On: 08/14/2019 18:39    Procedures Procedures (including critical care time)  Medications Ordered in ED Medications  sodium chloride flush (NS) 0.9 % injection 3 mL (has no administration in time range)  diphenhydrAMINE (BENADRYL) capsule 25-50 mg (25 mg Oral Given 08/14/19 1831)  ondansetron (ZOFRAN) injection 4 mg (4 mg Intravenous Given 08/14/19 1848)  dextrose 5 %-0.45 % sodium chloride infusion ( Intravenous Stopped 08/14/19 2057)  heparin lock flush 100 unit/mL (has no administration in time range)  HYDROmorphone (DILAUDID) injection 2 mg (2 mg Intravenous Given 08/14/19 1849)  HYDROmorphone (DILAUDID) injection 2 mg (2 mg Intravenous Given 08/14/19 1924)  oxyCODONE (Oxy IR/ROXICODONE) immediate release tablet 15 mg (15 mg Oral Given 08/14/19 1950)  ketorolac (TORADOL) 15 MG/ML injection 15 mg (15 mg Intravenous Given 08/14/19 1848)    ED Course  I have reviewed the triage vital signs and the nursing notes.  Pertinent labs & imaging results that were available during my care of the patient were reviewed by me and considered in my medical decision making (see chart for details).  Patient presents today with what she describes as her usual sickle cell pain.  Clinical Course as of Aug 13 2057  Sat Aug 14, 2019  2055 CBC with mild leukocytosis however it is only mildly elevated from baseline.  She has no infectious symptoms or fevers today.  Hemoglobin is improved from prior ED visits and is above baseline at 8.  No other CBC abnormalities   [WF]  2055 CMP  notable for mildly low potassium 3.3.  Patient is not experiencing any muscle cramps.  Doubt that she is symptomatic from her mild hypokalemia.  Instructed her to eat  high potassium foods for the several days and I printed out and personally instructed patient on potassium contents of several foods.   [WF]  2057 No evidence of infection on UA  Urinalysis, Routine w reflex microscopic(!) [WF]  2057 Reticulocytes(!) [WF]  2057 Mild reticulocyte elevation consistent with sickle cell crisis.  Reticulocytes(!) [WF]  2058 POC Covid test negative along with i-STAT beta-hCG's which is negative.   [WF]  2058 Reviewed by myself which is without abnormality.  No evidence of pneumonia or infiltrate.   [WF]  2058 I discussed with patient results of all of her blood work and offered her admission for her pain which is still 7/10.  She declines and states that she prefer to be discharged at this time.   [WF]  2058 Vital signs are within normal limits with no hypoxia, tachycardia, or tachypnea.  She is well-appearing she will be discharged at this time with follow-up with her PCP.   [WF]    Clinical Course User Index [WF] Tedd Sias, [place]   Patient would prefer to be discharged at this time.  Will send home with Phenergan.  Patient given strict return precautions.   MDM Rules/Calculators/A&P                      Final Clinical Impression(s) / ED Diagnoses Final diagnoses:  Sickle cell pain crisis ([place])  Dehydration    Rx / DC Orders ED Discharge  Orders         Ordered    promethazine (PHENERGAN) 25 MG suppository  Every 6 hours PRN     08/14/19 2045           Tedd Sias, [place] 08/14/19 2100    Margette Fast, MD 08/15/19 1351

## 2019-08-14 NOTE — ED Notes (Signed)
ED provider Long notified of Negative Poc Covid results

## 2019-08-16 MED ORDER — ACETAMINOPHEN 325 MG PO TABS
650.00 | ORAL_TABLET | ORAL | Status: DC
Start: ? — End: 2019-08-16

## 2019-08-16 MED ORDER — SODIUM CHLORIDE FLUSH 0.9 % IV SOLN
5.00 | INTRAVENOUS | Status: DC
Start: 2019-08-16 — End: 2019-08-16

## 2019-08-16 MED ORDER — MIRTAZAPINE 15 MG PO TABS
15.00 | ORAL_TABLET | ORAL | Status: DC
Start: 2019-08-16 — End: 2019-08-16

## 2019-08-16 MED ORDER — RIVAROXABAN 20 MG PO TABS
20.00 | ORAL_TABLET | ORAL | Status: DC
Start: 2019-08-17 — End: 2019-08-16

## 2019-08-16 MED ORDER — DEFERASIROX 360 MG PO TABS
1080.00 | ORAL_TABLET | ORAL | Status: DC
Start: 2019-08-17 — End: 2019-08-16

## 2019-08-16 MED ORDER — DIPHENHYDRAMINE HCL 25 MG PO CAPS
25.00 | ORAL_CAPSULE | ORAL | Status: DC
Start: ? — End: 2019-08-16

## 2019-08-25 ENCOUNTER — Encounter (HOSPITAL_COMMUNITY): Payer: Self-pay | Admitting: Emergency Medicine

## 2019-08-25 ENCOUNTER — Other Ambulatory Visit: Payer: Self-pay

## 2019-08-25 ENCOUNTER — Emergency Department (HOSPITAL_COMMUNITY): Payer: Medicare Other

## 2019-08-25 ENCOUNTER — Emergency Department (HOSPITAL_COMMUNITY)
Admission: EM | Admit: 2019-08-25 | Discharge: 2019-08-26 | Disposition: A | Discharge status: Home / Self Care | Payer: Medicare Other | Attending: Emergency Medicine | Admitting: Emergency Medicine

## 2019-08-25 DIAGNOSIS — Z79899 Other long term (current) drug therapy: Secondary | ICD-10-CM | POA: Insufficient documentation

## 2019-08-25 DIAGNOSIS — J45909 Unspecified asthma, uncomplicated: Secondary | ICD-10-CM | POA: Diagnosis not present

## 2019-08-25 DIAGNOSIS — D5700 Hb-SS disease with crisis, unspecified: Secondary | ICD-10-CM | POA: Diagnosis not present

## 2019-08-25 DIAGNOSIS — M545 Low back pain: Secondary | ICD-10-CM | POA: Diagnosis present

## 2019-08-25 LAB — I-STAT BETA HCG BLOOD, ED (MC, WL, AP ONLY): I-stat hCG, quantitative: <5 m[IU]/mL (ref <5)

## 2019-08-25 MED ORDER — HYDROMORPHONE HCL 4 MG PO TABS
4.00 | ORAL_TABLET | ORAL | Status: DC
Start: ? — End: 2019-08-25

## 2019-08-25 MED ORDER — SODIUM CHLORIDE 0.9 % IV SOLN
INTRAVENOUS | Status: DC
Start: ? — End: 2019-08-25

## 2019-08-25 MED ORDER — SENNOSIDES-DOCUSATE SODIUM 8.6-50 MG PO TABS
2.00 | ORAL_TABLET | ORAL | Status: DC
Start: 2019-08-25 — End: 2019-08-25

## 2019-08-25 MED ORDER — DIPHENHYDRAMINE HCL 50 MG/ML IJ SOLN
25.0000 mg | Freq: Once | INTRAMUSCULAR | Status: AC
  Administered 2019-08-25: 25 mg via INTRAVENOUS
  Filled 2019-08-25: qty 1

## 2019-08-25 MED ORDER — RIVAROXABAN 20 MG PO TABS
20.00 | ORAL_TABLET | ORAL | Status: DC
Start: 2019-08-26 — End: 2019-08-25

## 2019-08-25 MED ORDER — HYDROMORPHONE HCL 2 MG/ML IJ SOLN
2.0000 mg | Freq: Once | INTRAMUSCULAR | Status: AC
  Administered 2019-08-26: 2 mg via INTRAVENOUS
  Filled 2019-08-25: qty 1

## 2019-08-25 MED ORDER — FOLIC ACID 1 MG PO TABS
1.00 | ORAL_TABLET | ORAL | Status: DC
Start: 2019-08-26 — End: 2019-08-25

## 2019-08-25 MED ORDER — CHOLECALCIFEROL 25 MCG (1000 UT) PO TABS
2000.00 | ORAL_TABLET | ORAL | Status: DC
Start: 2019-08-26 — End: 2019-08-25

## 2019-08-25 MED ORDER — MOMETASONE FURO-FORMOTEROL FUM 100-5 MCG/ACT IN AERO
2.00 | INHALATION_SPRAY | RESPIRATORY_TRACT | Status: DC
Start: 2019-08-25 — End: 2019-08-25

## 2019-08-25 MED ORDER — CYANOCOBALAMIN 500 MCG PO TABS
1000.00 | ORAL_TABLET | ORAL | Status: DC
Start: 2019-08-26 — End: 2019-08-25

## 2019-08-25 MED ORDER — ALBUTEROL SULFATE (5 MG/ML) 0.5% IN NEBU
2.50 | INHALATION_SOLUTION | RESPIRATORY_TRACT | Status: DC
Start: ? — End: 2019-08-25

## 2019-08-25 MED ORDER — HYDROMORPHONE HCL 2 MG/ML IJ SOLN
2.0000 mg | INTRAMUSCULAR | Status: AC
  Administered 2019-08-25: 2 mg via INTRAVENOUS
  Filled 2019-08-25: qty 1

## 2019-08-25 MED ORDER — HYDROXYZINE PAMOATE 25 MG PO CAPS
25.00 | ORAL_CAPSULE | ORAL | Status: DC
Start: ? — End: 2019-08-25

## 2019-08-25 MED ORDER — ONDANSETRON HCL 4 MG/2ML IJ SOLN: 4.0000 mg | INTRAMUSCULAR | Status: DC | PRN

## 2019-08-25 MED ORDER — DEFERASIROX 360 MG PO TABS
1080.00 | ORAL_TABLET | ORAL | Status: DC
Start: 2019-08-26 — End: 2019-08-25

## 2019-08-25 MED ORDER — LIDOCAINE HCL 1 % IJ SOLN
0.50 | INTRAMUSCULAR | Status: DC
Start: ? — End: 2019-08-25

## 2019-08-25 MED ORDER — POLYETHYLENE GLYCOL 3350 17 GM/SCOOP PO POWD
17.00 | ORAL | Status: DC
Start: ? — End: 2019-08-25

## 2019-08-25 MED ORDER — GENERIC EXTERNAL MEDICATION
45.00 | Status: DC
Start: 2019-08-25 — End: 2019-08-25

## 2019-08-25 MED ORDER — DEXTROSE-NACL 5-0.45 % IV SOLN: INTRAVENOUS | Status: DC

## 2019-08-25 NOTE — ED Notes (Addendum)
Writer placed blood pressure cuff and pulse ox on pt and monitor is cycling. Pt has call bell within reach. Pt denies needing anything at this time.

## 2019-08-25 NOTE — ED Notes (Signed)
Pt ambulatory to BR with steady gait.

## 2019-08-25 NOTE — ED Triage Notes (Signed)
C/o sickle cell pain in her back for a couple days.

## 2019-08-25 NOTE — ED Notes (Signed)
Pt back in her room at this time and connected to the monitor. Pt has call bell within reach.

## 2019-08-25 NOTE — ED Provider Notes (Addendum)
[place] [facility] DEPT Provider Note   CSN: [id number] Arrival date & time: 08/25/19  1751     History Chief Complaint  Patient presents with  . Sickle Cell Pain Crisis  . Back Pain    Molly Gutierrez is a 39 y.o. female.  Patient just discharged from [facility] on 9 March.  Patient was there from March visit since March 9.  Patient with persistent low back pain similar to the sickle cell pain she has at times.  She has little bit of mild anterior chest discomfort.  No fevers.        Past Medical History:  Diagnosis Date  . Asthma   . Eczema   . History of pulmonary embolus (PE)   . Sickle cell anemia [facility])     Patient Active Problem List   Diagnosis Date Noted  . Sickle cell crisis acute chest syndrome ([place]) 07/29/2019  . Drug-seeking behavior 07/07/2019  . Sinus tachycardia 07/07/2019  . Therapeutic opioid-induced constipation (OIC) 07/07/2019  . Breast mass in female 06/29/2019  . Atelectasis of right lung 06/29/2019  . Sickle cell crisis ([place]) 06/27/2019  . Sickle cell pain crisis ([place]) 04/19/2019  . Pneumonia 03/27/2019  . 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 Anterior chest wall pain 11/13/2018  . Epigastric abdominal pain 11/13/2018  . Hematuria 11/13/2018  . Hyperbilirubinemia 11/12/2018  . Long term current use of anticoagulant therapy 11/12/2018  . History of pulmonary embolism 09/26/2018  . Acute pulmonary embolism ([place]) 09/26/2018  . Opioid dependence ([place]) 09/13/2018  . Port-A-Cath in place 09/13/2018  . Narcotic abuse, continuous ([place]) 08/28/2018  . Moderate persistent asthma without complication A999333  . Anemia 07/03/2018  . Personal history of other venous thrombosis and embolism 07/03/2018  . History of transfusion 07/03/2018  . Gastro-esophageal reflux disease without esophagitis 06/02/2018  . Asthma 05/19/2018  . Anxiety and depression 10/30/2017  . At risk for sepsis 06/13/2017  . Mitral regurgitation  02/01/2014  . Itching 12/13/2013  . Nausea & vomiting 12/13/2013  . 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 Frequent complaints of pain 11/01/2013  . Hypokalemia 09/19/2013  . S/P total hip arthroplasty 05/19/2013  . 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 Lower urinary tract infectious disease 03/02/2013  . Chest pain 11/14/2012  . Sickle-cell anemia ([place]) 10/30/2012  . Pain management 08/01/2012  . Reticulocytosis 07/31/2012  . 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 Leukocytosis 03/27/2012  . History of gestational diabetes 10/07/2011  . 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 CHOLECYSTECTOMY    . ERCP    . JOINT REPLACEMENT    . PORTA CATH INSERTION    . TUBAL LIGATION    . WISDOM TOOTH EXTRACTION       OB History   No obstetric history on file.     Family History  Problem Relation Age of Onset  . Renal Disease Mother   . Hypertension Mother   . High Cholesterol Mother     Social History   Tobacco Use  . Smoking status: Never Smoker  . Smokeless tobacco: Never Used  Substance Use Topics  . Alcohol use: Never  . Drug use: Never    Home Medications Prior to Admission medications   Medication Sig Start Date End Date Taking? Authorizing Provider  albuterol (VENTOLIN HFA) 108 (  90 Base) MCG/ACT inhaler Inhale 2 puffs into the lungs 2 (two) times daily as needed for wheezing or shortness of breath. 12/31/16  Yes [provider]  Cholecalciferol (VITAMIN D3) 25 MCG (1000 UT) CAPS Take 1,000 Units by mouth daily.   Yes [provider]  Deferasirox (JADENU) 360 MG TABS Take 1,080 mg by mouth daily before breakfast. 08/08/19 08/07/20 Yes [provider]  diphenhydrAMINE (BENADRYL) 25 mg capsule Take 25 mg by mouth  3 (three) times daily as needed for itching.   Yes [provider]  folic acid (FOLVITE) 1 MG tablet Take 1 mg by mouth daily. 12/31/16  Yes [provider]  HYDROmorphone (DILAUDID) 4 MG tablet Take 4 mg by mouth every 6 (six) hours as needed for moderate pain or severe pain.  08/20/19  Yes [provider]  mirtazapine (REMERON) 15 MG tablet Take 45 mg by mouth at bedtime.  08/08/19  Yes [provider]  mometasone-formoterol (DULERA) 100-5 MCG/ACT AERO Inhale 2 puffs into the lungs daily as needed for wheezing or shortness of breath.   Yes [provider]  omeprazole (PRILOSEC) 20 MG capsule Take 20 mg by mouth 2 (two) times a day. 09/08/18  Yes [provider]  Oxycodone HCl 10 MG TABS Take 1 tablet (10 mg total) by mouth every 4 (four) hours as needed (pain). Every 4 hours while awake Patient taking differently: Take 10 mg by mouth See admin instructions. Take 10 mg by mouth every four to six hours as needed for pain 08/04/19  Yes [name], FNP  promethazine (PHENERGAN) 25 MG suppository Place 1 suppository (25 mg total) rectally every 6 (six) hours as needed for nausea or vomiting. 08/14/19  Yes [place], Wylder S, PA  vitamin B-12 (CYANOCOBALAMIN) 1000 MCG tablet Take 1,000 mcg by mouth daily.   Yes [provider]  XARELTO 20 MG TABS tablet Take 1 tablet (20 mg total) by mouth daily. 05/14/19  Yes [name], MD  oxyCODONE (OXYCONTIN) 10 mg 12 hr tablet Take 1 tablet (10 mg total) by mouth every 12 (twelve) hours. Patient not taking: Reported on 08/08/2019 08/04/19   [name], FNP    Allergies    Cefaclor, Hydroxyurea, and Ketamine  Review of Systems   Review of Systems  Constitutional: Negative for chills and fever.  HENT: Negative for rhinorrhea and sore throat.   Eyes: Negative for visual disturbance.  Respiratory: Negative for cough and shortness of breath.   Cardiovascular: Positive for chest pain.  Negative for leg swelling.  Gastrointestinal: Negative for abdominal pain, diarrhea, nausea and vomiting.  Genitourinary: Negative for dysuria.  Musculoskeletal: Positive for back pain. Negative for neck pain.  Skin: Negative for rash.  Neurological: Negative for dizziness, light-headedness and headaches.  Hematological: Does not bruise/bleed easily.  Psychiatric/Behavioral: Negative for confusion.    Physical Exam Updated Vital Signs BP 119/83   Pulse 85   Temp 99.4 F (37.4 C) (Oral)   Resp 13   LMP 08/12/2019   SpO2 97%   Physical Exam Vitals and nursing note reviewed.  Constitutional:      General: She is not in acute distress.    Appearance: She is well-developed.  HENT:     Head: Normocephalic and atraumatic.  Eyes:     Conjunctiva/sclera: Conjunctivae normal.  Cardiovascular:     Rate and Rhythm: Normal rate and regular rhythm.     Heart sounds: No murmur.  Pulmonary:     Effort: Pulmonary  effort is normal. No respiratory distress.     Breath sounds: Normal breath sounds.  Abdominal:     Palpations: Abdomen is soft.     Tenderness: There is no abdominal tenderness.  Musculoskeletal:        General: No swelling. Normal range of motion.     Cervical back: Normal range of motion and neck supple.  Skin:    General: Skin is warm and dry.  Neurological:     General: No focal deficit present.     Mental Status: She is alert and oriented to person, place, and time.     Cranial Nerves: No cranial nerve deficit.     ED Results / Procedures / Treatments   Labs (all labs ordered are listed, but only abnormal results are displayed) Labs Reviewed  CBC WITH DIFFERENTIAL/PLATELET - Abnormal; Notable for the following components:      Result Value   WBC 17.8 (*)    RBC 2.78 (*)    Hemoglobin 8.2 (*)    HCT 25.4 (*)    RDW 18.4 (*)    Platelets 507 (*)    nRBC 0.4 (*)    Neutro Abs 10.8 (*)    Monocytes Absolute 2.4 (*)    Abs Immature Granulocytes 0.18 (*)     All other components within normal limits  RETICULOCYTES  BASIC METABOLIC PANEL  I-STAT BETA HCG BLOOD, ED (MC, [facility], AP ONLY)  I-STAT BETA HCG BLOOD, ED (MC, [facility], AP ONLY)    EKG EKG Interpretation  Date/Time:  Wednesday August 25 2019 20:27:55 EST Ventricular Rate:  94 PR Interval:    QRS Duration: 88 QT Interval:  368 QTC Calculation: 461 R Axis:   52 Text Interpretation: Sinus rhythm Abnormal R-wave progression, early transition Borderline T abnormalities, anterior leads 12 Lead; [place] No significant change since last tracing Confirmed by Fredia Sorrow [phone]) on 08/25/2019 11:33:56 PM   Radiology DG Chest 2 View  Result Date: 08/25/2019 CLINICAL DATA:  Chest pain sickle cell EXAM: CHEST - 2 VIEW COMPARISON:  08/14/2019, 04/22/2019 FINDINGS: Bilateral central venous catheter tips similar in position, left is over the right atrium and the right projects over the distal SVC. Low lung volumes. No acute consolidation, pleural effusion or pneumothorax. Borderline cardiomegaly. Mild diffuse chronic interstitial opacity. IMPRESSION: No active cardiopulmonary disease.  Borderline to mild cardiomegaly Electronically Signed   By: Donavan Foil M.D.   On: 08/25/2019 20:10    Procedures Procedures (including critical care time)  Medications Ordered in ED Medications  ondansetron (ZOFRAN) injection 4 mg (has no administration in time range)  dextrose 5 %-0.45 % sodium chloride infusion ( Intravenous New Bag/Given 08/25/19 2025)  HYDROmorphone (DILAUDID) injection 2 mg (2 mg Intravenous Given 08/25/19 2037)  HYDROmorphone (DILAUDID) injection 2 mg (2 mg Intravenous Given 08/25/19 2130)  diphenhydrAMINE (BENADRYL) injection 25 mg (25 mg Intravenous Given 08/25/19 2022)  diphenhydrAMINE (BENADRYL) injection 25 mg (25 mg Intravenous Given 08/25/19 2259)  HYDROmorphone (DILAUDID) injection 2 mg (2 mg Intravenous Given 08/26/19 0040)    ED Course  I have reviewed the triage vital signs and  the nursing notes.  Pertinent labs & imaging results that were available during my care of the patient were reviewed by me and considered in my medical decision making (see chart for details).    MDM Rules/Calculators/A&P                      Patient's pain improved here with the sickle cell  protocol.  Patient's hemoglobin is stable.  Chest x-ray negative.  EKG without any acute changes.  Labs without any significant abnormalities.   Final Clinical Impression(s) / ED Diagnoses Final diagnoses:  Sickle cell pain crisis [facility])    Rx / DC Orders ED Discharge Orders    None       Fredia Sorrow, MD 08/26/19 0100    Fredia Sorrow, MD 08/26/19 [id number]    Fredia Sorrow, MD 08/26/19 0104

## 2019-08-26 DIAGNOSIS — D5700 Hb-SS disease with crisis, unspecified: Secondary | ICD-10-CM | POA: Diagnosis not present

## 2019-08-26 LAB — BASIC METABOLIC PANEL
Anion gap: 7 (ref 5–15)
BUN: 10 mg/dL (ref 6–20)
CO2: 25 mmol/L (ref 22–32)
Calcium: 8.8 mg/dL — ABNORMAL LOW (ref 8.9–10.3)
Chloride: 104 mmol/L (ref 98–111)
Creatinine, Ser: 0.35 mg/dL — ABNORMAL LOW (ref 0.44–1.00)
GFR calc Af Amer: >60 mL/min (ref >60)
GFR calc non Af Amer: >60 mL/min (ref >60)
Glucose, Bld: 102 mg/dL — ABNORMAL HIGH (ref 70–99)
Potassium: 3.7 mmol/L (ref 3.5–5.1)
Sodium: 136 mmol/L (ref 135–145)

## 2019-08-26 LAB — CBC WITH DIFFERENTIAL/PLATELET
Abs Immature Granulocytes: 0.18 10*3/uL — ABNORMAL HIGH (ref 0.00–0.07)
Basophils Absolute: 0.1 10*3/uL (ref 0.0–0.1)
Basophils Relative: 1 %
Eosinophils Absolute: 0.4 10*3/uL (ref 0.0–0.5)
Eosinophils Relative: 3 %
HCT: 25.4 % — ABNORMAL LOW (ref 36.0–46.0)
Hemoglobin: 8.2 g/dL — ABNORMAL LOW (ref 12.0–15.0)
Immature Granulocytes: 1 %
Lymphocytes Relative: 22 %
Lymphs Abs: 3.8 10*3/uL (ref 0.7–4.0)
MCH: 29.5 pg (ref 26.0–34.0)
MCHC: 32.3 g/dL (ref 30.0–36.0)
MCV: 91.4 fL (ref 80.0–100.0)
Monocytes Absolute: 2.4 10*3/uL — ABNORMAL HIGH (ref 0.1–1.0)
Monocytes Relative: 14 %
Neutro Abs: 10.8 10*3/uL — ABNORMAL HIGH (ref 1.7–7.7)
Neutrophils Relative %: 59 %
Platelets: 507 10*3/uL — ABNORMAL HIGH (ref 150–400)
RBC: 2.78 MIL/uL — ABNORMAL LOW (ref 3.87–5.11)
RDW: 18.4 % — ABNORMAL HIGH (ref 11.5–15.5)
WBC: 17.8 10*3/uL — ABNORMAL HIGH (ref 4.0–10.5)
nRBC: 0.4 % — ABNORMAL HIGH (ref 0.0–0.2)

## 2019-08-26 LAB — RETICULOCYTES
Immature Retic Fract: 30.5 % — ABNORMAL HIGH (ref 2.3–15.9)
RBC.: 2.76 MIL/uL — ABNORMAL LOW (ref 3.87–5.11)
Retic Count, Absolute: 250.1 10*3/uL — ABNORMAL HIGH (ref 19.0–186.0)
Retic Ct Pct: 9.1 % — ABNORMAL HIGH (ref 0.4–3.1)

## 2019-08-26 MED ORDER — HEPARIN SOD (PORK) LOCK FLUSH 100 UNIT/ML IV SOLN
INTRAVENOUS | Status: AC
  Administered 2019-08-26: 500 [IU]
  Filled 2019-08-26: qty 5

## 2019-08-26 NOTE — ED Notes (Signed)
Pt A/Ox4 and ambulatory at discharge. Pt verbalized discharge instructions and denies any questions or concerns.

## 2019-08-26 NOTE — Discharge Instructions (Addendum)
Continue your current medications for pain.  Follow-up with your doctors.  Return for any new or worse symptoms.

## 2019-08-30 ENCOUNTER — Other Ambulatory Visit: Payer: Self-pay | Admitting: Family Medicine

## 2019-09-05 ENCOUNTER — Emergency Department (HOSPITAL_COMMUNITY): Payer: Medicare Other

## 2019-09-05 ENCOUNTER — Emergency Department (HOSPITAL_COMMUNITY)
Admission: EM | Admit: 2019-09-05 | Discharge: 2019-09-05 | Disposition: A | Discharge status: Home / Self Care | Payer: Medicare Other | Attending: Emergency Medicine | Admitting: Emergency Medicine

## 2019-09-05 ENCOUNTER — Other Ambulatory Visit: Payer: Self-pay

## 2019-09-05 DIAGNOSIS — G8929 Other chronic pain: Secondary | ICD-10-CM | POA: Insufficient documentation

## 2019-09-05 DIAGNOSIS — Z7901 Long term (current) use of anticoagulants: Secondary | ICD-10-CM | POA: Diagnosis not present

## 2019-09-05 DIAGNOSIS — R1110 Vomiting, unspecified: Secondary | ICD-10-CM | POA: Insufficient documentation

## 2019-09-05 DIAGNOSIS — Z86711 Personal history of pulmonary embolism: Secondary | ICD-10-CM | POA: Insufficient documentation

## 2019-09-05 DIAGNOSIS — D5700 Hb-SS disease with crisis, unspecified: Secondary | ICD-10-CM | POA: Insufficient documentation

## 2019-09-05 LAB — CBC WITH DIFFERENTIAL/PLATELET
Abs Immature Granulocytes: 0.11 10*3/uL — ABNORMAL HIGH (ref 0.00–0.07)
Basophils Absolute: 0.1 10*3/uL (ref 0.0–0.1)
Basophils Relative: 1 %
Eosinophils Absolute: 0.2 10*3/uL (ref 0.0–0.5)
Eosinophils Relative: 1 %
HCT: 24.6 % — ABNORMAL LOW (ref 36.0–46.0)
Hemoglobin: 7.9 g/dL — ABNORMAL LOW (ref 12.0–15.0)
Immature Granulocytes: 1 %
Lymphocytes Relative: 17 %
Lymphs Abs: 2.7 10*3/uL (ref 0.7–4.0)
MCH: 29.6 pg (ref 26.0–34.0)
MCHC: 32.1 g/dL (ref 30.0–36.0)
MCV: 92.1 fL (ref 80.0–100.0)
Monocytes Absolute: 2 10*3/uL — ABNORMAL HIGH (ref 0.1–1.0)
Monocytes Relative: 13 %
Neutro Abs: 10.5 10*3/uL — ABNORMAL HIGH (ref 1.7–7.7)
Neutrophils Relative %: 67 %
Platelets: 511 10*3/uL — ABNORMAL HIGH (ref 150–400)
RBC: 2.67 MIL/uL — ABNORMAL LOW (ref 3.87–5.11)
RDW: 19.2 % — ABNORMAL HIGH (ref 11.5–15.5)
WBC: 15.5 10*3/uL — ABNORMAL HIGH (ref 4.0–10.5)
nRBC: 0.4 % — ABNORMAL HIGH (ref 0.0–0.2)

## 2019-09-05 LAB — BASIC METABOLIC PANEL
Anion gap: 9 (ref 5–15)
BUN: 7 mg/dL (ref 6–20)
CO2: 23 mmol/L (ref 22–32)
Calcium: 8.7 mg/dL — ABNORMAL LOW (ref 8.9–10.3)
Chloride: 106 mmol/L (ref 98–111)
Creatinine, Ser: 0.43 mg/dL — ABNORMAL LOW (ref 0.44–1.00)
GFR calc Af Amer: >60 mL/min (ref >60)
GFR calc non Af Amer: >60 mL/min (ref >60)
Glucose, Bld: 108 mg/dL — ABNORMAL HIGH (ref 70–99)
Potassium: 3.5 mmol/L (ref 3.5–5.1)
Sodium: 138 mmol/L (ref 135–145)

## 2019-09-05 LAB — I-STAT BETA HCG BLOOD, ED (MC, WL, AP ONLY): I-stat hCG, quantitative: <5 m[IU]/mL (ref <5)

## 2019-09-05 LAB — RETICULOCYTES
Immature Retic Fract: 22.5 % — ABNORMAL HIGH (ref 2.3–15.9)
RBC.: 2.65 MIL/uL — ABNORMAL LOW (ref 3.87–5.11)
Retic Count, Absolute: 296.5 10*3/uL — ABNORMAL HIGH (ref 19.0–186.0)
Retic Ct Pct: 11.2 % — ABNORMAL HIGH (ref 0.4–3.1)

## 2019-09-05 MED ORDER — HEPARIN SOD (PORK) LOCK FLUSH 100 UNIT/ML IV SOLN
INTRAVENOUS | Status: AC
  Administered 2019-09-05: 500 [IU]
  Filled 2019-09-05: qty 5

## 2019-09-05 MED ORDER — PROMETHAZINE HCL 25 MG/ML IJ SOLN
12.5000 mg | Freq: Once | INTRAMUSCULAR | Status: AC
  Administered 2019-09-05: 12.5 mg via INTRAVENOUS
  Filled 2019-09-05: qty 1

## 2019-09-05 MED ORDER — HEPARIN SOD (PORK) LOCK FLUSH 100 UNIT/ML IV SOLN: 500.0000 [IU] | Freq: Once | INTRAVENOUS | Status: AC

## 2019-09-05 MED ORDER — HYDROMORPHONE HCL 2 MG/ML IJ SOLN
2.0000 mg | INTRAMUSCULAR | Status: AC
  Administered 2019-09-05: 2 mg via INTRAVENOUS
  Filled 2019-09-05: qty 1

## 2019-09-05 MED ORDER — SODIUM CHLORIDE 0.45 % IV SOLN: INTRAVENOUS | Status: DC

## 2019-09-05 MED ORDER — KETOROLAC TROMETHAMINE 15 MG/ML IJ SOLN
15.0000 mg | INTRAMUSCULAR | Status: AC
  Administered 2019-09-05: 15 mg via INTRAVENOUS
  Filled 2019-09-05: qty 1

## 2019-09-05 MED ORDER — DIPHENHYDRAMINE HCL 50 MG/ML IJ SOLN
25.0000 mg | Freq: Once | INTRAMUSCULAR | Status: AC
  Administered 2019-09-05: 25 mg via INTRAVENOUS
  Filled 2019-09-05: qty 1

## 2019-09-05 MED ORDER — MORPHINE SULFATE 30 MG PO TABS
30.0000 mg | ORAL_TABLET | Freq: Once | ORAL | Status: AC
  Administered 2019-09-05: 30 mg via ORAL
  Filled 2019-09-05: qty 1

## 2019-09-05 NOTE — ED Triage Notes (Signed)
Patient c/o pain sickle cell pain all over x2 weeks.

## 2019-09-05 NOTE — ED Provider Notes (Signed)
[facility] DEPT Provider Note   CSN: [id number] Arrival date & time: 09/05/19  1408     History Chief Complaint  Patient presents with  . Sickle Cell Pain Crisis    Molly Gutierrez is a 39 y.o. female.  HPI She presents for evaluation of "achy joints."  She states joints involved her arms and legs.  She states that this is a typical sickle cell crisis.  She is using her hydromorphone tablets, at home, x2 today, without relief.  She denies cough, shortness of breath, chest pain, weakness or dizziness.  She has been vomiting, and feels like she probably vomited her hydromorphone today.  She has been ill for 4 days with similar symptoms.  There are no other known modifying factors.  Her last visit with her hematologist at [facility], was 08/18/2019.  She has hemoglobin SS disease.  She has had apparently had increased frequency of sickle cell crisis, since a life-threatening event during pregnancy, 18 years ago.  She is noted to have chronic pain.  Last blood transfusion, February 2021.  She has received pheresis twice this year.  She has had recurrent pulmonary embolus, treated with Xarelto.    Past Medical History:  Diagnosis Date  . Asthma   . Eczema   . History of pulmonary embolus (PE)   . Sickle cell anemia [facility])     Patient Active Problem List   Diagnosis Date Noted  . Sickle cell crisis acute chest syndrome ([place]) 07/29/2019  . Drug-seeking behavior 07/07/2019  . Sinus tachycardia 07/07/2019  . Therapeutic opioid-induced constipation (OIC) 07/07/2019  . Breast mass in female 06/29/2019  . Atelectasis of right lung 06/29/2019  . Sickle cell crisis ([place]) 06/27/2019  . Sickle cell pain crisis ([place]) 04/19/2019  . Pneumonia 03/27/2019  . 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 Anterior chest wall pain 11/13/2018  . Epigastric abdominal pain 11/13/2018  . Hematuria 11/13/2018  . Hyperbilirubinemia 11/12/2018  . Long term current use of  anticoagulant therapy 11/12/2018  . History of pulmonary embolism 09/26/2018  . Acute pulmonary embolism ([place]) 09/26/2018  . Opioid dependence ([place]) 09/13/2018  . Port-A-Cath in place 09/13/2018  . Narcotic abuse, continuous ([place]) 08/28/2018  . Moderate persistent asthma without complication [id number]  . Anemia 07/03/2018  . Personal history of other venous thrombosis and embolism 07/03/2018  . History of transfusion 07/03/2018  . Gastro-esophageal reflux disease without esophagitis 06/02/2018  . Asthma 05/19/2018  . Anxiety and depression 10/30/2017  . At risk for sepsis 06/13/2017  . Mitral regurgitation 02/01/2014  . Itching 12/13/2013  . Nausea & vomiting 12/13/2013  . 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 Frequent complaints of pain 11/01/2013  . Hypokalemia 09/19/2013  . S/P total hip arthroplasty 05/19/2013  . 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 Lower urinary tract infectious disease 03/02/2013  . Chest pain 11/14/2012  . Sickle-cell anemia ([place]) 10/30/2012  . Pain management 08/01/2012  . Reticulocytosis 07/31/2012  . 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 Leukocytosis 03/27/2012  . History of gestational diabetes 10/07/2011  . Hb-SS disease with crisis, unspecified ([place]) 06/25/2010    Past Surgical History:  Procedure Laterality Date  . CHOLECYSTECTOMY    . ERCP    . JOINT REPLACEMENT    . PORTA CATH INSERTION    . TUBAL LIGATION    . WISDOM TOOTH EXTRACTION  OB History   No obstetric history on file.     Family History  Problem Relation Age of Onset  . Renal Disease Mother   . Hypertension Mother   . High Cholesterol Mother     Social History   Tobacco Use  . Smoking status: Never Smoker  . Smokeless tobacco: Never Used  Substance Use Topics  . Alcohol use: Never  . Drug  use: Never    Home Medications Prior to Admission medications   Medication Sig Start Date End Date Taking? Authorizing Provider  albuterol (VENTOLIN HFA) 108 (90 Base) MCG/ACT inhaler Inhale 2 puffs into the lungs 2 (two) times daily as needed for wheezing or shortness of breath. 12/31/16  Yes [provider]  Cholecalciferol (VITAMIN D3) 25 MCG (1000 UT) CAPS Take 1,000 Units by mouth daily.   Yes [provider]  Deferasirox (JADENU) 360 MG TABS Take 1,080 mg by mouth daily before breakfast. 08/08/19 08/07/20 Yes [provider]  diphenhydrAMINE (BENADRYL) 25 mg capsule Take 25 mg by mouth 3 (three) times daily as needed for itching.   Yes [provider]  folic acid (FOLVITE) 1 MG tablet Take 1 mg by mouth daily. 12/31/16  Yes [provider]  HYDROmorphone (DILAUDID) 4 MG tablet Take 4 mg by mouth every 6 (six) hours as needed for moderate pain or severe pain.  08/20/19  Yes [provider]  mirtazapine (REMERON) 15 MG tablet Take 45 mg by mouth at bedtime.  08/08/19  Yes [provider]  mometasone-formoterol (DULERA) 100-5 MCG/ACT AERO Inhale 2 puffs into the lungs daily as needed for wheezing or shortness of breath.   Yes [provider]  omeprazole (PRILOSEC) 20 MG capsule Take 20 mg by mouth 2 (two) times a day. 09/08/18  Yes [provider]  promethazine (PHENERGAN) 25 MG suppository Place 1 suppository (25 mg total) rectally every 6 (six) hours as needed for nausea or vomiting. 08/14/19  Yes Fondaw, Wylder S, PA  vitamin B-12 (CYANOCOBALAMIN) 1000 MCG tablet Take 1,000 mcg by mouth daily.   Yes [provider]  XARELTO 20 MG TABS tablet Take 1 tablet (20 mg total) by mouth daily. Patient taking differently: Take 20 mg by mouth daily with supper.  05/14/19  Yes [name], MD  oxyCODONE (OXYCONTIN) 10 mg 12 hr tablet Take 1 tablet (10 mg total) by mouth every 12 (twelve) hours. Patient not taking:  Reported on 08/08/2019 08/04/19   [name], FNP  Oxycodone HCl 10 MG TABS Take 1 tablet (10 mg total) by mouth every 4 (four) hours as needed (pain). Every 4 hours while awake Patient not taking: Reported on 09/05/2019 08/04/19   [name], FNP    Allergies    Cefaclor, Hydroxyurea, and Ketamine  Review of Systems   Review of Systems  All other systems reviewed and are negative.   Physical Exam Updated Vital Signs BP 106/71   Pulse 85   Temp 99 F (37.2 C)   Resp 18   Wt 56.2 kg   LMP 08/12/2019   SpO2 98%   BMI 21.95 kg/m   Physical Exam Vitals and nursing note reviewed.  Constitutional:      General: She is in acute distress (Uncomfortable, rocking).     Appearance: She is well-developed. She is not ill-appearing, toxic-appearing or diaphoretic.  HENT:     Head: Normocephalic and atraumatic.     Right Ear: External ear normal.     Left  Ear: External ear normal.  Eyes:     Conjunctiva/sclera: Conjunctivae normal.     Pupils: Pupils are equal, round, and reactive to light.  Neck:     Trachea: Phonation normal.  Cardiovascular:     Rate and Rhythm: Normal rate and regular rhythm.     Heart sounds: Normal heart sounds.     Comments: There are 2 vascular ports, on the bilateral upper chest, sites appear normal. Pulmonary:     Effort: Pulmonary effort is normal.     Breath sounds: Normal breath sounds.  Abdominal:     General: There is no distension.     Palpations: Abdomen is soft.     Tenderness: There is no abdominal tenderness.  Musculoskeletal:        General: No swelling or tenderness. Normal range of motion.     Cervical back: Normal range of motion and neck supple.  Skin:    General: Skin is warm and dry.  Neurological:     Mental Status: She is alert and oriented to person, place, and time.     Cranial Nerves: No cranial nerve deficit.     Sensory: No sensory deficit.     Motor: No abnormal muscle tone.     Coordination: Coordination  normal.  Psychiatric:        Mood and Affect: Mood normal.        Behavior: Behavior normal.        Thought Content: Thought content normal.        Judgment: Judgment normal.     ED Results / Procedures / Treatments   Labs (all labs ordered are listed, but only abnormal results are displayed) Labs Reviewed  CBC WITH DIFFERENTIAL/PLATELET - Abnormal; Notable for the following components:      Result Value   WBC 15.5 (*)    RBC 2.67 (*)    Hemoglobin 7.9 (*)    HCT 24.6 (*)    RDW 19.2 (*)    Platelets 511 (*)    nRBC 0.4 (*)    Neutro Abs 10.5 (*)    Monocytes Absolute 2.0 (*)    Abs Immature Granulocytes 0.11 (*)    All other components within normal limits  RETICULOCYTES - Abnormal; Notable for the following components:   Retic Ct Pct 11.2 (*)    RBC. 2.65 (*)    Retic Count, Absolute 296.5 (*)    Immature Retic Fract 22.5 (*)    All other components within normal limits  BASIC METABOLIC PANEL - Abnormal; Notable for the following components:   Glucose, Bld 108 (*)    Creatinine, Ser 0.43 (*)    Calcium 8.7 (*)    All other components within normal limits  I-STAT BETA HCG BLOOD, ED ([facility], [facility], [facility] ONLY)    EKG None  Radiology DG Chest Port 1 View  Result Date: 09/05/2019 CLINICAL DATA:  Pain. History of sickle cell. All over body pain for 2 weeks. EXAM: PORTABLE CHEST 1 VIEW COMPARISON:  08/25/2019 FINDINGS: Patient has RIGHT-sided PowerPort, tip to the superior vena cava. LEFT-sided power port tip overlies the superior vena cava. Catheters appear unchanged. Heart is mildly enlarged. There are faint interstitial markings throughout the lungs and stable in appearance. No new consolidations. IMPRESSION: Stable cardiomegaly. Electronically Signed   By: Nolon Nations M.D.   On: 09/05/2019 15:14    Procedures Procedures (including critical care time)  Medications Ordered in ED Medications  0.45 % sodium chloride infusion ( Intravenous New Bag/Given  09/05/19 1500)   ketorolac (TORADOL) 15 MG/ML injection 15 mg (15 mg Intravenous Given 09/05/19 1500)  HYDROmorphone (DILAUDID) injection 2 mg (2 mg Intravenous Given 09/05/19 1514)  HYDROmorphone (DILAUDID) injection 2 mg (2 mg Intravenous Given 09/05/19 1545)  morphine (MSIR) tablet 30 mg (30 mg Oral Given 09/05/19 1652)  diphenhydrAMINE (BENADRYL) injection 25 mg (25 mg Intravenous Given 09/05/19 1503)  promethazine (PHENERGAN) injection 12.5 mg (12.5 mg Intravenous Given 09/05/19 1502)    ED Course  I have reviewed the triage vital signs and the nursing notes.  Pertinent labs & imaging results that were available during my care of the patient were reviewed by me and considered in my medical decision making (see chart for details).  Clinical Course as of Sep 04 1745  Sun Sep 05, 2019  1718 Normal  I-Stat beta hCG blood, ED [EW]  1719 Normal except glucose high, creatinine low, calcium low  Basic metabolic panel(!) [EW]  0000000 Elevated reticulocyte count  Reticulocytes(!) [EW]  1719 Normal except white count high, hemoglobin low, platelets high  CBC WITH DIFFERENTIAL(!) [EW]  1719 No infiltrate or CHF, interpreted by me  DG Chest Port 1 View [EW]  [id number] Basic metabolic panel(!) [EW]    Clinical Course User Index [EW] Daleen Bo, MD   MDM Rules/Calculators/A&P                       Patient Vitals for the past 24 hrs:  BP Temp Pulse Resp SpO2 Weight  09/05/19 1745 106/71 - - 18 98 % -  09/05/19 1730 108/75 - - - - -  09/05/19 1630 100/69 - - - - -  09/05/19 1600 99/70 - 85 18 94 % -  09/05/19 1545 110/77 - - 18 94 % -  09/05/19 1530 109/78 - - - - -  09/05/19 1515 - - - - - 56.2 kg  09/05/19 1515 113/83 - - - - -  09/05/19 1415 116/73 99 F (37.2 C) (!) 109 16 100 % -    5:20 PM Reevaluation with update and discussion. After initial assessment and treatment, an updated evaluation reveals the patient was initially reluctant to take her third dose of analgesia, orally as is typically.  Daleen Bo   5:45 PM: At this time, she is alert and comfortable.  She states her pain has been controlled and she is ready to go home.  Was with her family questions were answered.  Medical Decision Making: Patient with sickle cell anemia, and symptoms consistent with crisis.  She also has chronic pain, and is managed both in [place], by primary care in interim, [place] by hematology.  Doubt serious bacterial infection, metabolic instability or impending vascular collapse.  She is stable for discharge.  Lurlie Maloney was evaluated in Emergency Department on 09/05/2019 for the symptoms described in the history of present illness. She was evaluated in the context of the global COVID-19 pandemic, which necessitated consideration that the patient might be at risk for infection with the SARS-CoV-2 virus that causes COVID-19. Institutional protocols and algorithms that pertain to the evaluation of patients at risk for COVID-19 are in a state of rapid change based on information released by regulatory bodies including the CDC and federal and state organizations. These policies and algorithms were followed during the patient's care in the ED.  CRITICAL CARE- No Performed by: Daleen Bo   Nursing Notes Reviewed/ Care Coordinated Applicable Imaging Reviewed Interpretation of Laboratory Data incorporated into ED treatment  The patient appears reasonably screened and/or stabilized for discharge and I doubt any other medical condition or other [facility] requiring further screening, evaluation, or treatment in the ED at this time prior to discharge.  Plan: Home Medications-continue usual; Home Treatments-courage advance diet and activity; return here if the recommended treatment, does not improve the symptoms; Recommended follow up- Hematology prn    Final Clinical Impression(s) / ED Diagnoses Final diagnoses:  Sickle cell pain crisis ([place])  Other chronic pain    Rx / DC Orders ED  Discharge Orders    None       Daleen Bo, MD 09/05/19 1749

## 2019-09-05 NOTE — Discharge Instructions (Addendum)
Follow-up with your primary care doctor and/or hematologist, as needed for problems.

## 2019-09-05 NOTE — ED Notes (Signed)
Pt verbalizes understanding of DC instructions. Pt belongings returned and is ambulatory out of ED.  

## 2019-09-09 ENCOUNTER — Emergency Department (HOSPITAL_COMMUNITY): Payer: Medicare Other

## 2019-09-09 ENCOUNTER — Other Ambulatory Visit: Payer: Self-pay

## 2019-09-09 ENCOUNTER — Encounter (HOSPITAL_COMMUNITY): Payer: Self-pay

## 2019-09-09 ENCOUNTER — Emergency Department (HOSPITAL_COMMUNITY)
Admission: EM | Admit: 2019-09-09 | Discharge: 2019-09-10 | Disposition: A | Discharge status: Home / Self Care | Payer: Medicare Other | Attending: Emergency Medicine | Admitting: Emergency Medicine

## 2019-09-09 DIAGNOSIS — Z79899 Other long term (current) drug therapy: Secondary | ICD-10-CM | POA: Insufficient documentation

## 2019-09-09 DIAGNOSIS — D5700 Hb-SS disease with crisis, unspecified: Secondary | ICD-10-CM | POA: Insufficient documentation

## 2019-09-09 DIAGNOSIS — J45909 Unspecified asthma, uncomplicated: Secondary | ICD-10-CM | POA: Diagnosis not present

## 2019-09-09 DIAGNOSIS — R112 Nausea with vomiting, unspecified: Secondary | ICD-10-CM | POA: Insufficient documentation

## 2019-09-09 DIAGNOSIS — Z7901 Long term (current) use of anticoagulants: Secondary | ICD-10-CM | POA: Diagnosis not present

## 2019-09-09 DIAGNOSIS — R079 Chest pain, unspecified: Secondary | ICD-10-CM | POA: Diagnosis present

## 2019-09-09 LAB — I-STAT BETA HCG BLOOD, ED (MC, WL, AP ONLY): I-stat hCG, quantitative: <5 m[IU]/mL (ref <5)

## 2019-09-09 LAB — CBC WITH DIFFERENTIAL/PLATELET
Abs Immature Granulocytes: 0.21 10*3/uL — ABNORMAL HIGH (ref 0.00–0.07)
Basophils Absolute: 0.1 10*3/uL (ref 0.0–0.1)
Basophils Relative: 1 %
Eosinophils Absolute: 0.4 10*3/uL (ref 0.0–0.5)
Eosinophils Relative: 2 %
HCT: 23 % — ABNORMAL LOW (ref 36.0–46.0)
Hemoglobin: 7.6 g/dL — ABNORMAL LOW (ref 12.0–15.0)
Immature Granulocytes: 1 %
Lymphocytes Relative: 24 %
Lymphs Abs: 4.1 10*3/uL — ABNORMAL HIGH (ref 0.7–4.0)
MCH: 30.6 pg (ref 26.0–34.0)
MCHC: 33 g/dL (ref 30.0–36.0)
MCV: 92.7 fL (ref 80.0–100.0)
Monocytes Absolute: 2 10*3/uL — ABNORMAL HIGH (ref 0.1–1.0)
Monocytes Relative: 12 %
Neutro Abs: 10.4 10*3/uL — ABNORMAL HIGH (ref 1.7–7.7)
Neutrophils Relative %: 60 %
Platelets: 518 10*3/uL — ABNORMAL HIGH (ref 150–400)
RBC: 2.48 MIL/uL — ABNORMAL LOW (ref 3.87–5.11)
RDW: 20 % — ABNORMAL HIGH (ref 11.5–15.5)
WBC: 17.2 10*3/uL — ABNORMAL HIGH (ref 4.0–10.5)
nRBC: 0.6 % — ABNORMAL HIGH (ref 0.0–0.2)

## 2019-09-09 LAB — RETICULOCYTES
Immature Retic Fract: 31.3 % — ABNORMAL HIGH (ref 2.3–15.9)
RBC.: 2.5 MIL/uL — ABNORMAL LOW (ref 3.87–5.11)
Retic Count, Absolute: 327.2 10*3/uL — ABNORMAL HIGH (ref 19.0–186.0)
Retic Ct Pct: 13.1 % — ABNORMAL HIGH (ref 0.4–3.1)

## 2019-09-09 MED ORDER — GENERIC EXTERNAL MEDICATION
5000.00 | Status: DC
Start: 2019-09-10 — End: 2019-09-09

## 2019-09-09 MED ORDER — MIRTAZAPINE 15 MG PO TABS
45.00 | ORAL_TABLET | ORAL | Status: DC
Start: 2019-09-09 — End: 2019-09-09

## 2019-09-09 MED ORDER — DIPHENHYDRAMINE HCL 25 MG PO CAPS
25.00 | ORAL_CAPSULE | ORAL | Status: DC
Start: ? — End: 2019-09-09

## 2019-09-09 MED ORDER — DEFERASIROX 360 MG PO TABS
1080.00 | ORAL_TABLET | ORAL | Status: DC
Start: 2019-09-10 — End: 2019-09-09

## 2019-09-09 MED ORDER — NALOXONE HCL 0.4 MG/ML IJ SOLN
0.40 | INTRAMUSCULAR | Status: DC
Start: ? — End: 2019-09-09

## 2019-09-09 MED ORDER — RIVAROXABAN 20 MG PO TABS
20.00 | ORAL_TABLET | ORAL | Status: DC
Start: 2019-09-10 — End: 2019-09-09

## 2019-09-09 MED ORDER — PROMETHAZINE HCL 25 MG/ML IJ SOLN: 25.0000 mg | INTRAMUSCULAR | Status: DC | PRN

## 2019-09-09 MED ORDER — HYDROMORPHONE HCL 2 MG/ML IJ SOLN
2.0000 mg | INTRAMUSCULAR | Status: AC
  Administered 2019-09-10: 2 mg via INTRAVENOUS
  Filled 2019-09-09: qty 1

## 2019-09-09 MED ORDER — DIPHENHYDRAMINE HCL 50 MG/ML IJ SOLN
25.0000 mg | Freq: Once | INTRAMUSCULAR | Status: AC
  Administered 2019-09-10: 25 mg via INTRAVENOUS
  Filled 2019-09-09: qty 1

## 2019-09-09 MED ORDER — SODIUM CHLORIDE 0.9% FLUSH: 3.0000 mL | Freq: Once | INTRAVENOUS | Status: DC

## 2019-09-09 MED ORDER — SENNOSIDES-DOCUSATE SODIUM 8.6-50 MG PO TABS
2.00 | ORAL_TABLET | ORAL | Status: DC
Start: 2019-09-10 — End: 2019-09-09

## 2019-09-09 MED ORDER — MOMETASONE FURO-FORMOTEROL FUM 100-5 MCG/ACT IN AERO
2.00 | INHALATION_SPRAY | RESPIRATORY_TRACT | Status: DC
Start: 2019-09-09 — End: 2019-09-09

## 2019-09-09 MED ORDER — OXYCODONE HCL 5 MG PO TABS
15.0000 mg | ORAL_TABLET | Freq: Once | ORAL | Status: AC
  Administered 2019-09-10: 15 mg via ORAL
  Filled 2019-09-09: qty 3

## 2019-09-09 MED ORDER — FOLIC ACID 1 MG PO TABS
1.00 | ORAL_TABLET | ORAL | Status: DC
Start: 2019-09-10 — End: 2019-09-09

## 2019-09-09 MED ORDER — ACETAMINOPHEN 325 MG PO TABS
650.00 | ORAL_TABLET | ORAL | Status: DC
Start: ? — End: 2019-09-09

## 2019-09-09 MED ORDER — LACTATED RINGERS IV BOLUS
1000.0000 mL | Freq: Once | INTRAVENOUS | Status: AC
  Administered 2019-09-10: 1000 mL via INTRAVENOUS

## 2019-09-09 MED ORDER — ALBUTEROL SULFATE HFA 108 (90 BASE) MCG/ACT IN AERS
2.00 | INHALATION_SPRAY | RESPIRATORY_TRACT | Status: DC
Start: ? — End: 2019-09-09

## 2019-09-09 MED ORDER — ONDANSETRON HCL 4 MG/2ML IJ SOLN
4.00 | INTRAMUSCULAR | Status: DC
Start: ? — End: 2019-09-09

## 2019-09-09 MED ORDER — HYDROMORPHONE HCL 2 MG PO TABS
6.00 | ORAL_TABLET | ORAL | Status: DC
Start: ? — End: 2019-09-09

## 2019-09-09 MED ORDER — POLYETHYLENE GLYCOL 3350 17 GM/SCOOP PO POWD
17.00 | ORAL | Status: DC
Start: ? — End: 2019-09-09

## 2019-09-09 MED ORDER — CYANOCOBALAMIN 500 MCG PO TABS
1000.00 | ORAL_TABLET | ORAL | Status: DC
Start: 2019-09-10 — End: 2019-09-09

## 2019-09-09 NOTE — ED Notes (Signed)
Pt is requesting port to be accessed for blood work.

## 2019-09-09 NOTE — ED Provider Notes (Signed)
TIME SEEN: 11:12 PM  CHIEF COMPLAINT: Sickle cell pain, chest pain  HPI: Patient is a 39 year old female with history of asthma asthma sickle cell anemia, PE currently on Xarelto who presents to the emergency department several days of chest pain that feels similar to her previous sickle cell crises.  Unrelieved with oral medicines at home.  No fever, chills, cough, shortness of breath, lower extremity swelling or pain.  Reports this does not feel similar to her previous PE and she is compliant with her Xarelto.  She states she has had nausea and vomiting which is typical when she is in pain.  No diarrhea.  ROS: See HPI Constitutional: no fever  Eyes: no drainage  ENT: no runny nose   Cardiovascular:   chest pain  Resp: no SOB  GI: no vomiting GU: no dysuria Integumentary: no rash  Allergy: no hives  Musculoskeletal: no leg swelling  Neurological: no slurred speech ROS otherwise negative  PAST MEDICAL HISTORY/PAST SURGICAL HISTORY:  Past Medical History:  Diagnosis Date  . Asthma   . Eczema   . History of pulmonary embolus (PE)   . Sickle cell anemia (H[facility])     MEDICATIONS:  Prior to Admission medications   Medication Sig Start Date End Date Taking? Authorizing Provider  albuterol (VENTOLIN HFA) 108 (90 Base) MCG/ACT inhaler Inhale 2 puffs into the lungs 2 (two) times daily as needed for wheezing or shortness of breath. 12/31/16   [provider]  Cholecalciferol (VITAMIN D3) 25 MCG (1000 UT) CAPS Take 1,000 Units by mouth daily.    [provider]  Deferasirox (JADENU) 360 MG TABS Take 1,080 mg by mouth daily before breakfast. 08/08/19 08/07/20  [provider]  diphenhydrAMINE (BENADRYL) 25 mg capsule Take 25 mg by mouth 3 (three) times daily as needed for itching.    [provider]  folic acid (FOLVITE) 1 MG tablet Take 1 mg by mouth daily. 12/31/16   [provider]  HYDROmorphone (DILAUDID) 4 MG tablet Take 4 mg by mouth every 6 (six)  hours as needed for moderate pain or severe pain.  08/20/19   [provider]  mirtazapine (REMERON) 15 MG tablet Take 45 mg by mouth at bedtime.  08/08/19   [provider]  mometasone-formoterol (DULERA) 100-5 MCG/ACT AERO Inhale 2 puffs into the lungs daily as needed for wheezing or shortness of breath.    [provider]  omeprazole (PRILOSEC) 20 MG capsule Take 20 mg by mouth 2 (two) times a day. 09/08/18   [provider]  oxyCODONE (OXYCONTIN) 10 mg 12 hr tablet Take 1 tablet (10 mg total) by mouth every 12 (twelve) hours. Patient not taking: Reported on 08/08/2019 08/04/19   [name], FNP  Oxycodone HCl 10 MG TABS Take 1 tablet (10 mg total) by mouth every 4 (four) hours as needed (pain). Every 4 hours while awake Patient not taking: Reported on 09/05/2019 08/04/19   [name], FNP  promethazine (PHENERGAN) 25 MG suppository Place 1 suppository (25 mg total) rectally every 6 (six) hours as needed for nausea or vomiting. 08/14/19   Tedd Sias, PA  vitamin B-12 (CYANOCOBALAMIN) 1000 MCG tablet Take 1,000 mcg by mouth daily.    [provider]  XARELTO 20 MG TABS tablet Take 1 tablet (20 mg total) by mouth daily. Patient taking differently: Take 20 mg by mouth daily with supper.  05/14/19   [name], MD    ALLERGIES:  Allergies  Allergen  Reactions  . Cefaclor Hives and Swelling  . Hydroxyurea Palpitations and Other (See Comments)    Lowers "blood levels" and heart rate (causes HYPOtension); "it messes me up, it drops my levels and stuff"   . Ketamine Palpitations and Other (See Comments)    "Pt states she has had previous reaction to ketamine. States she becomes flushed, heart races, dizzy, and feels like she is going to pass out."    SOCIAL HISTORY:  Social History   Tobacco Use  . Smoking status: Never Smoker  . Smokeless tobacco: Never Used  Substance Use Topics  . Alcohol use: Never    FAMILY  HISTORY: Family History  Problem Relation Age of Onset  . Renal Disease Mother   . Hypertension Mother   . High Cholesterol Mother     EXAM: BP 105/75   Pulse 96   Temp 99.3 F (37.4 C)   Resp 18   Ht 5\' 3"  (1.6 m)   Wt 58.1 kg   LMP 08/12/2019   SpO2 94%   BMI 22.67 kg/m  CONSTITUTIONAL: Alert and oriented and responds appropriately to questions.  Appears uncomfortable but not in significant distress.  Nontoxic-appearing. HEAD: Normocephalic EYES: Conjunctivae clear, pupils appear equal, EOM appear intact ENT: normal nose; moist mucous membranes NECK: Supple, normal ROM CARD: RRR; S1 and S2 appreciated; no murmurs, no clicks, no rubs, no gallops RESP: Normal chest excursion without splinting or tachypnea; breath sounds clear and equal bilaterally; no wheezes, no rhonchi, no rales, no hypoxia or respiratory distress, speaking full sentences ABD/GI: Normal bowel sounds; non-distended; soft, non-tender, no rebound, no guarding, no peritoneal signs, no hepatosplenomegaly BACK:  The back appears normal EXT: Normal ROM in all joints; no deformity noted, no edema; no cyanosis, no calf tenderness or calf swelling, 2+ DP pulses bilaterally SKIN: Normal color for age and race; warm; no rash on exposed skin NEURO: Moves all extremities equally PSYCH: The patient's mood and manner are appropriate.   MEDICAL DECISION MAKING: Patient here with sickle cell crisis unrelieved with pain medicine at home.  Was here 4 days ago for the same.  Will repeat labs, chest x-ray.  EKG shows no ischemic change.  Doubt ACS.  States this does not feel like her PE and she is compliant with her Xarelto.  No hypoxia, tachycardia or tachypnea here.  Will give IV fluids, pain and nausea medicine.  ED PROGRESS: Patient's labs consistent with sickle cell crisis but appear to be stable compared to labs 4 days ago.  Chest x-ray clear.  Continues to be hemodynamically stable.  Reassessed patient and she states her pain  is improved after 2 doses of pain medication.  She feels her nausea is better and will attempt oral pain medicine and oral Benadryl.  Patient would like discharge home over hospitalization.  She has outpatient follow-up.   At this time, I do not feel there is any life-threatening condition present. I have reviewed, interpreted and discussed all results (EKG, imaging, lab, urine as appropriate) and exam findings with patient/family. I have reviewed nursing notes and appropriate previous records.  I feel the patient is safe to be discharged home without further emergent workup and can continue workup as an outpatient as needed. Discussed usual and customary return precautions. Patient/family verbalize understanding and are comfortable with this plan.  Outpatient follow-up has been provided as needed. All questions have been answered.    EKG Interpretation  Date/Time:  Thursday September 09 2019 20:58:20 EDT Ventricular Rate:  98  PR Interval:    QRS Duration: 82 QT Interval:  349 QTC Calculation: 446 R Axis:   53 Text Interpretation: Sinus rhythm Borderline T wave abnormalities Baseline wander in lead(s) I II aVR aVF V6 12 Lead; [place] No significant change since last tracing Confirmed by Pryor Curia [phone]) on 09/09/2019 11:13:55 PM        CRITICAL CARE Performed by: Cyril Mourning Math Brazie   Total critical care time: 45 minutes  Critical care time was exclusive of separately billable procedures and treating other patients.  Critical care was necessary to treat or prevent imminent or life-threatening deterioration.  Critical care was time spent personally by me on the following activities: development of treatment plan with patient and/or surrogate as well as nursing, discussions with consultants, evaluation of patient's response to treatment, examination of patient, obtaining history from patient or surrogate, ordering and performing treatments and interventions, ordering and review of  laboratory studies, ordering and review of radiographic studies, pulse oximetry and re-evaluation of patient's condition.   Molly Gutierrez was evaluated in Emergency Department on 09/09/2019 for the symptoms described in the history of present illness. She was evaluated in the context of the global COVID-19 pandemic, which necessitated consideration that the patient might be at risk for infection with the SARS-CoV-2 virus that causes COVID-19. Institutional protocols and algorithms that pertain to the evaluation of patients at risk for COVID-19 are in a state of rapid change based on information released by regulatory bodies including the CDC and federal and state organizations. These policies and algorithms were followed during the patient's care in the ED.  Patient was seen wearing mask, eye protection, gloves.    Ian Cavey, Delice Bison, DO 09/10/19 [phone]

## 2019-09-09 NOTE — ED Triage Notes (Signed)
Pt presents with Sickle Cell Pain in her chest. Pt reports taking all her home medications with no relief. Pt is A&Ox4 and ambulatory in triage.

## 2019-09-10 DIAGNOSIS — D5700 Hb-SS disease with crisis, unspecified: Secondary | ICD-10-CM | POA: Diagnosis not present

## 2019-09-10 LAB — COMPREHENSIVE METABOLIC PANEL
ALT: 22 U/L (ref 0–44)
AST: 39 U/L (ref 15–41)
Albumin: 4.1 g/dL (ref 3.5–5.0)
Alkaline Phosphatase: 65 U/L (ref 38–126)
Anion gap: 7 (ref 5–15)
BUN: 6 mg/dL (ref 6–20)
CO2: 26 mmol/L (ref 22–32)
Calcium: 8.7 mg/dL — ABNORMAL LOW (ref 8.9–10.3)
Chloride: 106 mmol/L (ref 98–111)
Creatinine, Ser: 0.35 mg/dL — ABNORMAL LOW (ref 0.44–1.00)
GFR calc Af Amer: >60 mL/min (ref >60)
GFR calc non Af Amer: >60 mL/min (ref >60)
Glucose, Bld: 99 mg/dL (ref 70–99)
Potassium: 3.5 mmol/L (ref 3.5–5.1)
Sodium: 139 mmol/L (ref 135–145)
Total Bilirubin: 3.2 mg/dL — ABNORMAL HIGH (ref 0.3–1.2)
Total Protein: 7.3 g/dL (ref 6.5–8.1)

## 2019-09-10 MED ORDER — HEPARIN SOD (PORK) LOCK FLUSH 100 UNIT/ML IV SOLN
INTRAVENOUS | Status: AC
  Filled 2019-09-10: qty 5

## 2019-09-10 MED ORDER — DIPHENHYDRAMINE HCL 25 MG PO CAPS
25.0000 mg | ORAL_CAPSULE | Freq: Once | ORAL | Status: AC
  Administered 2019-09-10: 25 mg via ORAL
  Filled 2019-09-10: qty 1

## 2019-09-10 NOTE — ED Notes (Signed)
PT is A/O and ambulatory at discharge. PT verbalized discharge instructions. Pt port deaccessed with heparin flush per protocol.

## 2019-09-14 ENCOUNTER — Other Ambulatory Visit: Payer: Self-pay

## 2019-09-14 DIAGNOSIS — Z79899 Other long term (current) drug therapy: Secondary | ICD-10-CM | POA: Insufficient documentation

## 2019-09-14 DIAGNOSIS — R0789 Other chest pain: Secondary | ICD-10-CM | POA: Diagnosis present

## 2019-09-14 DIAGNOSIS — Z9660 Presence of unspecified orthopedic joint implant: Secondary | ICD-10-CM | POA: Insufficient documentation

## 2019-09-14 DIAGNOSIS — R112 Nausea with vomiting, unspecified: Secondary | ICD-10-CM | POA: Diagnosis not present

## 2019-09-14 DIAGNOSIS — J45909 Unspecified asthma, uncomplicated: Secondary | ICD-10-CM | POA: Diagnosis not present

## 2019-09-14 DIAGNOSIS — D5700 Hb-SS disease with crisis, unspecified: Secondary | ICD-10-CM | POA: Diagnosis not present

## 2019-09-15 ENCOUNTER — Emergency Department (HOSPITAL_COMMUNITY)
Admission: EM | Admit: 2019-09-15 | Discharge: 2019-09-15 | Disposition: A | Discharge status: Home / Self Care | Payer: Medicare Other | Attending: Emergency Medicine | Admitting: Emergency Medicine

## 2019-09-15 ENCOUNTER — Emergency Department (HOSPITAL_COMMUNITY): Payer: Medicare Other

## 2019-09-15 ENCOUNTER — Encounter (HOSPITAL_COMMUNITY): Payer: Self-pay

## 2019-09-15 DIAGNOSIS — D5700 Hb-SS disease with crisis, unspecified: Secondary | ICD-10-CM

## 2019-09-15 DIAGNOSIS — R112 Nausea with vomiting, unspecified: Secondary | ICD-10-CM

## 2019-09-15 LAB — CBC WITH DIFFERENTIAL/PLATELET
Abs Immature Granulocytes: 0.12 10*3/uL — ABNORMAL HIGH (ref 0.00–0.07)
Basophils Absolute: 0.1 10*3/uL (ref 0.0–0.1)
Basophils Relative: 0 %
Eosinophils Absolute: 0.3 10*3/uL (ref 0.0–0.5)
Eosinophils Relative: 1 %
HCT: 24.2 % — ABNORMAL LOW (ref 36.0–46.0)
Hemoglobin: 8.1 g/dL — ABNORMAL LOW (ref 12.0–15.0)
Immature Granulocytes: 1 %
Lymphocytes Relative: 14 %
Lymphs Abs: 3 10*3/uL (ref 0.7–4.0)
MCH: 30.1 pg (ref 26.0–34.0)
MCHC: 33.5 g/dL (ref 30.0–36.0)
MCV: 90 fL (ref 80.0–100.0)
Monocytes Absolute: 2.8 10*3/uL — ABNORMAL HIGH (ref 0.1–1.0)
Monocytes Relative: 13 %
Neutro Abs: 15.2 10*3/uL — ABNORMAL HIGH (ref 1.7–7.7)
Neutrophils Relative %: 71 %
Platelets: 487 10*3/uL — ABNORMAL HIGH (ref 150–400)
RBC: 2.69 MIL/uL — ABNORMAL LOW (ref 3.87–5.11)
RDW: 18.9 % — ABNORMAL HIGH (ref 11.5–15.5)
WBC: 21.5 10*3/uL — ABNORMAL HIGH (ref 4.0–10.5)
nRBC: 0.3 % — ABNORMAL HIGH (ref 0.0–0.2)

## 2019-09-15 LAB — RETICULOCYTES
Immature Retic Fract: 20.7 % — ABNORMAL HIGH (ref 2.3–15.9)
RBC.: 2.68 MIL/uL — ABNORMAL LOW (ref 3.87–5.11)
Retic Count, Absolute: 311.4 10*3/uL — ABNORMAL HIGH (ref 19.0–186.0)
Retic Ct Pct: 11.6 % — ABNORMAL HIGH (ref 0.4–3.1)

## 2019-09-15 LAB — COMPREHENSIVE METABOLIC PANEL
ALT: 29 U/L (ref 0–44)
AST: 46 U/L — ABNORMAL HIGH (ref 15–41)
Albumin: 4.5 g/dL (ref 3.5–5.0)
Alkaline Phosphatase: 77 U/L (ref 38–126)
Anion gap: 8 (ref 5–15)
BUN: 12 mg/dL (ref 6–20)
CO2: 22 mmol/L (ref 22–32)
Calcium: 8.9 mg/dL (ref 8.9–10.3)
Chloride: 108 mmol/L (ref 98–111)
Creatinine, Ser: 0.43 mg/dL — ABNORMAL LOW (ref 0.44–1.00)
GFR calc Af Amer: >60 mL/min (ref >60)
GFR calc non Af Amer: >60 mL/min (ref >60)
Glucose, Bld: 107 mg/dL — ABNORMAL HIGH (ref 70–99)
Potassium: 3.6 mmol/L (ref 3.5–5.1)
Sodium: 138 mmol/L (ref 135–145)
Total Bilirubin: 4.4 mg/dL — ABNORMAL HIGH (ref 0.3–1.2)
Total Protein: 8.1 g/dL (ref 6.5–8.1)

## 2019-09-15 LAB — I-STAT BETA HCG BLOOD, ED (MC, WL, AP ONLY): I-stat hCG, quantitative: <5 m[IU]/mL (ref <5)

## 2019-09-15 MED ORDER — DIPHENHYDRAMINE HCL 50 MG/ML IJ SOLN
25.0000 mg | Freq: Once | INTRAMUSCULAR | Status: AC
  Administered 2019-09-15: 25 mg via INTRAMUSCULAR
  Filled 2019-09-15: qty 1

## 2019-09-15 MED ORDER — HYDROMORPHONE HCL 2 MG/ML IJ SOLN
2.0000 mg | INTRAMUSCULAR | Status: AC
  Administered 2019-09-15: 2 mg via SUBCUTANEOUS
  Filled 2019-09-15: qty 1

## 2019-09-15 MED ORDER — ONDANSETRON HCL 4 MG PO TABS: 4.0000 mg | ORAL_TABLET | Freq: Three times a day (TID) | ORAL | 0 refills | Status: DC | PRN

## 2019-09-15 MED ORDER — HEPARIN SOD (PORK) LOCK FLUSH 100 UNIT/ML IV SOLN
INTRAVENOUS | Status: AC
  Administered 2019-09-15: 500 [IU]
  Filled 2019-09-15: qty 5

## 2019-09-15 MED ORDER — PROMETHAZINE HCL 25 MG/ML IJ SOLN
25.0000 mg | Freq: Once | INTRAMUSCULAR | Status: AC
  Administered 2019-09-15: 25 mg via INTRAMUSCULAR
  Filled 2019-09-15: qty 1

## 2019-09-15 MED ORDER — KETOROLAC TROMETHAMINE 15 MG/ML IJ SOLN
15.0000 mg | INTRAMUSCULAR | Status: AC
  Administered 2019-09-15: 15 mg via INTRAMUSCULAR
  Filled 2019-09-15: qty 1

## 2019-09-15 MED ORDER — HYDROMORPHONE HCL 2 MG PO TABS
4.0000 mg | ORAL_TABLET | Freq: Once | ORAL | Status: AC
  Administered 2019-09-15: 4 mg via ORAL
  Filled 2019-09-15: qty 2

## 2019-09-15 NOTE — ED Provider Notes (Signed)
[place] DEPT Provider Note   CSN: [id number] Arrival date & time: 09/14/19  2352   Time seen 3:22 AM  History Chief complaint sickle cell crisis pain   Molly Gutierrez is a 39 y.o. female.  HPI   Patient states this crisis started 1 week ago.  She has pain in the center of her chest that she describes as dull, throbbing, and aching.  She states that similar to pain she has had before with her sickle cell crisis.  She also complains of pain in both of her legs.  She denies fever, cough, rhinorrhea, sore throat, diarrhea, or shortness of breath.  She states she had nausea and vomiting on January 30 and vomited about 4 times that was nonbloody.  PCP Vevelyn Francois, NP    Past Medical History:  Diagnosis Date  . Asthma   . Eczema   . History of pulmonary embolus (PE)   . Sickle cell anemia [facility])     Patient Active Problem List   Diagnosis Date Noted  . Sickle cell crisis acute chest syndrome ([place]) 07/29/2019  . Drug-seeking behavior 07/07/2019  . Sinus tachycardia 07/07/2019  . Therapeutic opioid-induced constipation (OIC) 07/07/2019  . Breast mass in female 06/29/2019  . Atelectasis of right lung 06/29/2019  . Sickle cell crisis ([place]) 06/27/2019  . Sickle cell pain crisis ([place]) 04/19/2019  . Pneumonia 03/27/2019  . 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 Anterior chest wall pain 11/13/2018  . Epigastric abdominal pain 11/13/2018  . Hematuria 11/13/2018  . Hyperbilirubinemia 11/12/2018  . Long term current use of anticoagulant therapy 11/12/2018  . History of pulmonary embolism 09/26/2018  . Acute pulmonary embolism ([place]) 09/26/2018  . Opioid dependence ([place]) 09/13/2018  . Port-A-Cath in place 09/13/2018  . Narcotic abuse, continuous ([place]) 08/28/2018  . Moderate persistent asthma without complication [id number]  . Anemia 07/03/2018  . Personal history of other venous thrombosis and embolism 07/03/2018  . History of transfusion  07/03/2018  . Gastro-esophageal reflux disease without esophagitis 06/02/2018  . Asthma 05/19/2018  . Anxiety and depression 10/30/2017  . At risk for sepsis 06/13/2017  . Mitral regurgitation 02/01/2014  . Itching 12/13/2013  . Nausea & vomiting 12/13/2013  . 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 Frequent complaints of pain 11/01/2013  . Hypokalemia 09/19/2013  . S/P total hip arthroplasty 05/19/2013  . 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 Lower urinary tract infectious disease 03/02/2013  . Chest pain 11/14/2012  . Sickle-cell anemia ([place]) 10/30/2012  . Pain management 08/01/2012  . Reticulocytosis 07/31/2012  . 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 Leukocytosis 03/27/2012  . History of gestational diabetes 10/07/2011  . 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 CHOLECYSTECTOMY    . ERCP    . JOINT REPLACEMENT    . PORTA CATH INSERTION    . TUBAL LIGATION    . WISDOM TOOTH EXTRACTION       OB History   No obstetric history on file.     Family History  Problem Relation Age of Onset  . Renal Disease Mother   . Hypertension Mother   . High Cholesterol Mother     Social History   Tobacco Use  . Smoking status: Never Smoker  . Smokeless tobacco: Never Used  Substance Use Topics  .  Alcohol use: Never  . Drug use: Never    Home Medications Prior to Admission medications   Medication Sig Start Date End Date Taking? Authorizing Provider  albuterol (VENTOLIN HFA) 108 (90 Base) MCG/ACT inhaler Inhale 2 puffs into the lungs 2 (two) times daily as needed for wheezing or shortness of breath. 12/31/16  Yes [provider]  Cholecalciferol (VITAMIN D3) 25 MCG (1000 UT) CAPS Take 1,000 Units by mouth daily.   Yes [provider]  Deferasirox (JADENU) 360 MG TABS Take 1,080 mg by mouth daily before breakfast. 08/08/19 08/07/20 Yes [provider]  diphenhydrAMINE (BENADRYL) 25 mg capsule Take 25 mg by mouth 3 (three) times daily as needed for itching.   Yes [provider]  folic acid (FOLVITE) 1 MG tablet Take 1 mg by mouth daily. 12/31/16  Yes [provider]  HYDROmorphone (DILAUDID) 4 MG tablet Take 4 mg by mouth every 6 (six) hours as needed for moderate pain or severe pain.  08/20/19  Yes [provider]  mirtazapine (REMERON) 15 MG tablet Take 45 mg by mouth at bedtime.  08/08/19  Yes [provider]  mometasone-formoterol (DULERA) 100-5 MCG/ACT AERO Inhale 2 puffs into the lungs daily as needed for wheezing or shortness of breath.   Yes [provider]  omeprazole (PRILOSEC) 20 MG capsule Take 20 mg by mouth 2 (two) times a day. 09/08/18  Yes [provider]  promethazine (PHENERGAN) 25 MG suppository Place 1 suppository (25 mg total) rectally every 6 (six) hours as needed for nausea or vomiting. 08/14/19  Yes Fondaw, Wylder S, PA  vitamin B-12 (CYANOCOBALAMIN) 1000 MCG tablet Take 1,000 mcg by mouth daily.   Yes [provider]  XARELTO 20 MG TABS tablet Take 1 tablet (20 mg total) by mouth daily. Patient taking differently: Take 20 mg by mouth daily with supper.  05/14/19  Yes [name], MD  ondansetron (ZOFRAN) 4 MG tablet Take 1 tablet (4 mg total) by mouth every 8 (eight) hours as needed for nausea or vomiting. 09/15/19   Rolland Porter, MD  oxyCODONE (OXYCONTIN) 10 mg 12 hr tablet Take 1 tablet (10 mg total) by mouth every 12 (twelve) hours. Patient not taking: Reported on 08/08/2019 08/04/19   [name], FNP  Oxycodone HCl 10 MG TABS Take 1 tablet (10 mg total) by mouth every 4 (four) hours as needed (pain). Every 4 hours while awake Patient not taking: Reported on 09/15/2019 08/04/19   [name], FNP     Allergies    Cefaclor, Hydroxyurea, and Ketamine  Review of Systems   Review of Systems  All other systems reviewed and are negative.   Physical Exam Updated Vital Signs BP 113/81   Pulse 86   Temp 98.9 F (37.2 C) (Oral)   Resp (!) 22   Ht 5\' 3"  (1.6 m)   Wt 57.6 kg   LMP  (LMP Unknown)   SpO2 100%   BMI 22.50 kg/m   Physical Exam Vitals and nursing note reviewed.  Constitutional:      General: She is not in acute distress.    Appearance: Normal appearance. She is normal weight.  HENT:     Head: Normocephalic and atraumatic.     Right Ear: External ear normal.     Left Ear: External ear normal.     Nose: Nose normal.     Mouth/Throat:     Mouth: Mucous membranes are moist.  Eyes:  General: Scleral icterus present.     Extraocular Movements: Extraocular movements intact.     Conjunctiva/sclera: Conjunctivae normal.     Pupils: Pupils are equal, round, and reactive to light.  Cardiovascular:     Rate and Rhythm: Normal rate and regular rhythm.     Heart sounds: No murmur.  Pulmonary:     Effort: Pulmonary effort is normal. No respiratory distress.     Breath sounds: Normal breath sounds.  Musculoskeletal:        General: Normal range of motion.     Cervical back: Normal range of motion.  Skin:    General: Skin is warm and dry.  Neurological:     General: No focal deficit present.     Mental Status: She is alert and oriented to person, place, and time.     Cranial Nerves: No cranial nerve deficit.  Psychiatric:        Mood and Affect: Mood normal.        Behavior: Behavior normal.        Thought Content: Thought content normal.     ED Results / Procedures / Treatments   Labs (all labs ordered are listed, but only abnormal results are displayed) Results for orders placed or performed during the hospital encounter of 09/15/19  Reticulocytes  Result Value Ref Range   Retic Ct Pct 11.6 (H) 0.4 - 3.1 %   RBC. 2.68 (L) 3.87 - 5.11 MIL/uL   Retic  Count, Absolute 311.4 (H) 19.0 - 186.0 K/uL   Immature Retic Fract 20.7 (H) 2.3 - 15.9 %  Comprehensive metabolic panel  Result Value Ref Range   Sodium 138 135 - 145 mmol/L   Potassium 3.6 3.5 - 5.1 mmol/L   Chloride 108 98 - 111 mmol/L   CO2 22 22 - 32 mmol/L   Glucose, Bld 107 (H) 70 - 99 mg/dL   BUN 12 6 - 20 mg/dL   Creatinine, Ser 0.43 (L) 0.44 - 1.00 mg/dL   Calcium 8.9 8.9 - 10.3 mg/dL   Total Protein 8.1 6.5 - 8.1 g/dL   Albumin 4.5 3.5 - 5.0 g/dL   AST 46 (H) 15 - 41 U/L   ALT 29 0 - 44 U/L   Alkaline Phosphatase 77 38 - 126 U/L   Total Bilirubin 4.4 (H) 0.3 - 1.2 mg/dL   GFR calc non Af Amer >60 >60 mL/min   GFR calc Af Amer >60 >60 mL/min   Anion gap 8 5 - 15  CBC WITH DIFFERENTIAL  Result Value Ref Range   WBC 21.5 (H) 4.0 - 10.5 K/uL   RBC 2.69 (L) 3.87 - 5.11 MIL/uL   Hemoglobin 8.1 (L) 12.0 - 15.0 g/dL   HCT 24.2 (L) 36.0 - 46.0 %   MCV 90.0 80.0 - 100.0 fL   MCH 30.1 26.0 - 34.0 pg   MCHC 33.5 30.0 - 36.0 g/dL   RDW 18.9 (H) 11.5 - 15.5 %   Platelets 487 (H) 150 - 400 K/uL   nRBC 0.3 (H) 0.0 - 0.2 %   Neutrophils Relative % 71 %   Neutro Abs 15.2 (H) 1.7 - 7.7 K/uL   Lymphocytes Relative 14 %   Lymphs Abs 3.0 0.7 - 4.0 K/uL   Monocytes Relative 13 %   Monocytes Absolute 2.8 (H) 0.1 - 1.0 K/uL   Eosinophils Relative 1 %   Eosinophils Absolute 0.3 0.0 - 0.5 K/uL   Basophils Relative 0 %   Basophils Absolute 0.1 0.0 -  0.1 K/uL   Immature Granulocytes 1 %   Abs Immature Granulocytes 0.12 (H) 0.00 - 0.07 K/uL  I-Stat beta hCG blood, ED  Result Value Ref Range   I-stat hCG, quantitative <5.0 <5 mIU/mL   Comment 3            Laboratory interpretation all normal except stable anemia, expected elevation of reticulocyte count, expected leukocytosis, elevation of total bilirubin     EKG None  ED ECG REPORT   Date: 09/15/2019  Rate: 97  Rhythm: normal sinus rhythm  QRS Axis: right  Intervals: QT prolonged  ST/T Wave abnormalities: nonspecific T  wave changes  Conduction Disutrbances:PRWP  Narrative Interpretation:   Old EKG Reviewed: none available  I have personally reviewed the EKG tracing and agree with the computerized printout as noted.   Radiology DG Chest Port 1 View  Result Date: 09/15/2019 CLINICAL DATA:  Chest pain and sickle cell crisis EXAM: PORTABLE CHEST 1 VIEW COMPARISON:  09/09/2019 FINDINGS: Cardiac shadow is stable. Bilateral chest wall port are again seen and stable. No focal infiltrate or sizable effusion is seen. No acute bony abnormality is noted. IMPRESSION: No acute abnormality noted. Electronically Signed   By: Inez Catalina M.D.   On: 09/15/2019 03:25    DG Chest 2 View  Result Date: 08/25/2019 CLINICAL DATA:  Chest pain sickle cell . IMPRESSION: No active cardiopulmonary disease.  Borderline to mild cardiomegaly Electronically Signed   By: Donavan Foil M.D.   On: 08/25/2019 20:10    DG Chest Portable 1 View  Result Date: 09/09/2019 CLINICAL DATA:  Chest painIMPRESSION: No active disease. Electronically Signed   By: Virgina Norfolk M.D.   On: 09/09/2019 23:26   DG Chest Port 1 View  Result Date: 09/05/2019 CLINICAL DATA:  Pain. History of sickle cell. All over body pain for 2 weeks. IMPRESSION: Stable cardiomegaly. Electronically Signed   By: Nolon Nations M.D.   On: 09/05/2019 15:14    Procedures Procedures (including critical care time)  Medications Ordered in ED Medications  ketorolac (TORADOL) 15 MG/ML injection 15 mg (15 mg Intramuscular Given 09/15/19 0405)  HYDROmorphone (DILAUDID) injection 2 mg (2 mg Subcutaneous Given 09/15/19 0409)  HYDROmorphone (DILAUDID) injection 2 mg (2 mg Subcutaneous Given 09/15/19 0449)  diphenhydrAMINE (BENADRYL) injection 25 mg (25 mg Intramuscular Given 09/15/19 0400)  promethazine (PHENERGAN) injection 25 mg (25 mg Intramuscular Given 09/15/19 0403)  HYDROmorphone (DILAUDID) tablet 4 mg (4 mg Oral Given 09/15/19 0650)  heparin lock flush 100 UNIT/ML  injection (500 Units  Given 09/15/19 [id number])    ED Course  I have reviewed the triage vital signs and the nursing notes.  Pertinent labs & imaging results that were available during my care of the patient were reviewed by me and considered in my medical decision making (see chart for details).    MDM Rules/Calculators/A&P                      This is the patient's fourth ED visit this month.  I decided to start giving her medications IM and subcu.  This was explained to the patient.  Patient did not appear to be any acute distress.  Recheck at 6:15 AM patient is feeling better.  She will be given some oral Dilaudid and she feels good enough to be discharged home.  She was given a prescription of Zofran to have at home.  Final Clinical Impression(s) / ED Diagnoses Final diagnoses:  Sickle cell crisis ([place])  Nausea and vomiting, intractability of vomiting not specified, unspecified vomiting type    Rx / DC Orders ED Discharge Orders         Ordered    ondansetron (ZOFRAN) 4 MG tablet  Every 8 hours PRN     09/15/19 0646         Plan discharge  Rolland Porter, MD, Barbette Or, MD 09/15/19 [phone]

## 2019-09-15 NOTE — ED Triage Notes (Signed)
Pt complains of sickle cell pain on her chest and legs for "a minute" No relief with her hydromorphone

## 2019-09-15 NOTE — Discharge Instructions (Addendum)
Use the Zofran for nausea or vomiting.  Take your pain medications as prescribed.  Try to drink plenty of fluids.

## 2019-11-12 ENCOUNTER — Other Ambulatory Visit: Payer: Self-pay

## 2019-11-12 ENCOUNTER — Emergency Department (HOSPITAL_COMMUNITY)
Admission: EM | Admit: 2019-11-12 | Discharge: 2019-11-13 | Disposition: A | Discharge status: Home / Self Care | Payer: Medicare Other | Attending: Emergency Medicine | Admitting: Emergency Medicine

## 2019-11-12 ENCOUNTER — Emergency Department (HOSPITAL_COMMUNITY): Payer: Medicare Other

## 2019-11-12 ENCOUNTER — Encounter (HOSPITAL_COMMUNITY): Payer: Self-pay | Admitting: Emergency Medicine

## 2019-11-12 DIAGNOSIS — D5700 Hb-SS disease with crisis, unspecified: Secondary | ICD-10-CM | POA: Diagnosis not present

## 2019-11-12 DIAGNOSIS — D5709 Hb-ss disease with crisis with other specified complication: Secondary | ICD-10-CM

## 2019-11-12 DIAGNOSIS — Z7901 Long term (current) use of anticoagulants: Secondary | ICD-10-CM | POA: Insufficient documentation

## 2019-11-12 DIAGNOSIS — Z79899 Other long term (current) drug therapy: Secondary | ICD-10-CM | POA: Insufficient documentation

## 2019-11-12 DIAGNOSIS — J45909 Unspecified asthma, uncomplicated: Secondary | ICD-10-CM | POA: Diagnosis not present

## 2019-11-12 LAB — RETICULOCYTES
Immature Retic Fract: 12.9 % (ref 2.3–15.9)
RBC.: 2.8 MIL/uL — ABNORMAL LOW (ref 3.87–5.11)
Retic Count, Absolute: 139.2 10*3/uL (ref 19.0–186.0)
Retic Ct Pct: 5 % — ABNORMAL HIGH (ref 0.4–3.1)

## 2019-11-12 LAB — CBC WITH DIFFERENTIAL/PLATELET
Abs Immature Granulocytes: 0.13 10*3/uL — ABNORMAL HIGH (ref 0.00–0.07)
Basophils Absolute: 0.1 10*3/uL (ref 0.0–0.1)
Basophils Relative: 1 %
Eosinophils Absolute: 0.4 10*3/uL (ref 0.0–0.5)
Eosinophils Relative: 2 %
HCT: 26.4 % — ABNORMAL LOW (ref 36.0–46.0)
Hemoglobin: 8.7 g/dL — ABNORMAL LOW (ref 12.0–15.0)
Immature Granulocytes: 1 %
Lymphocytes Relative: 17 %
Lymphs Abs: 3.9 10*3/uL (ref 0.7–4.0)
MCH: 30.9 pg (ref 26.0–34.0)
MCHC: 33 g/dL (ref 30.0–36.0)
MCV: 93.6 fL (ref 80.0–100.0)
Monocytes Absolute: 2.9 10*3/uL — ABNORMAL HIGH (ref 0.1–1.0)
Monocytes Relative: 13 %
Neutro Abs: 15.3 10*3/uL — ABNORMAL HIGH (ref 1.7–7.7)
Neutrophils Relative %: 66 %
Platelets: 635 10*3/uL — ABNORMAL HIGH (ref 150–400)
RBC: 2.82 MIL/uL — ABNORMAL LOW (ref 3.87–5.11)
RDW: 17.4 % — ABNORMAL HIGH (ref 11.5–15.5)
WBC: 22.6 10*3/uL — ABNORMAL HIGH (ref 4.0–10.5)
nRBC: 0.2 % (ref 0.0–0.2)

## 2019-11-12 LAB — COMPREHENSIVE METABOLIC PANEL
ALT: 25 U/L (ref 0–44)
AST: 28 U/L (ref 15–41)
Albumin: 4.8 g/dL (ref 3.5–5.0)
Alkaline Phosphatase: 84 U/L (ref 38–126)
Anion gap: 9 (ref 5–15)
BUN: 20 mg/dL (ref 6–20)
CO2: 24 mmol/L (ref 22–32)
Calcium: 9 mg/dL (ref 8.9–10.3)
Chloride: 104 mmol/L (ref 98–111)
Creatinine, Ser: 0.55 mg/dL (ref 0.44–1.00)
GFR calc Af Amer: >60 mL/min (ref >60)
GFR calc non Af Amer: >60 mL/min (ref >60)
Glucose, Bld: 103 mg/dL — ABNORMAL HIGH (ref 70–99)
Potassium: 4.1 mmol/L (ref 3.5–5.1)
Sodium: 137 mmol/L (ref 135–145)
Total Bilirubin: 3.1 mg/dL — ABNORMAL HIGH (ref 0.3–1.2)
Total Protein: 8.4 g/dL — ABNORMAL HIGH (ref 6.5–8.1)

## 2019-11-12 LAB — I-STAT BETA HCG BLOOD, ED (MC, WL, AP ONLY): I-stat hCG, quantitative: <5 m[IU]/mL (ref <5)

## 2019-11-12 LAB — TROPONIN I (HIGH SENSITIVITY): Troponin I (High Sensitivity): 3 ng/L (ref <18)

## 2019-11-12 MED ORDER — OXYCODONE HCL 5 MG PO TABS
15.0000 mg | ORAL_TABLET | Freq: Once | ORAL | Status: AC
  Administered 2019-11-12: 15 mg via ORAL
  Filled 2019-11-12: qty 3

## 2019-11-12 MED ORDER — ALBUTEROL SULFATE HFA 108 (90 BASE) MCG/ACT IN AERS
2.00 | INHALATION_SPRAY | RESPIRATORY_TRACT | Status: DC
Start: ? — End: 2019-11-12

## 2019-11-12 MED ORDER — SENNOSIDES-DOCUSATE SODIUM 8.6-50 MG PO TABS
2.00 | ORAL_TABLET | ORAL | Status: DC
Start: 2019-11-12 — End: 2019-11-12

## 2019-11-12 MED ORDER — PROMETHAZINE HCL 25 MG/ML IJ SOLN
12.5000 mg | Freq: Once | INTRAMUSCULAR | Status: AC
  Administered 2019-11-12: 12.5 mg via INTRAVENOUS
  Filled 2019-11-12: qty 1

## 2019-11-12 MED ORDER — LIDOCAINE 5 % EX PTCH
2.00 | MEDICATED_PATCH | CUTANEOUS | Status: DC
Start: 2019-11-13 — End: 2019-11-12

## 2019-11-12 MED ORDER — ONDANSETRON HCL 4 MG/2ML IJ SOLN
4.00 | INTRAMUSCULAR | Status: DC
Start: ? — End: 2019-11-12

## 2019-11-12 MED ORDER — DIPHENHYDRAMINE HCL 25 MG PO CAPS
50.00 | ORAL_CAPSULE | ORAL | Status: DC
Start: ? — End: 2019-11-12

## 2019-11-12 MED ORDER — SODIUM CHLORIDE FLUSH 0.9 % IV SOLN
5.00 | INTRAVENOUS | Status: DC
Start: 2019-11-12 — End: 2019-11-12

## 2019-11-12 MED ORDER — ACETAMINOPHEN 325 MG PO TABS
975.00 | ORAL_TABLET | ORAL | Status: DC
Start: 2019-11-12 — End: 2019-11-12

## 2019-11-12 MED ORDER — MIRTAZAPINE 15 MG PO TABS
15.00 | ORAL_TABLET | ORAL | Status: DC
Start: 2019-11-12 — End: 2019-11-12

## 2019-11-12 MED ORDER — CELECOXIB 200 MG PO CAPS
200.00 | ORAL_CAPSULE | ORAL | Status: DC
Start: 2019-11-12 — End: 2019-11-12

## 2019-11-12 MED ORDER — GENERIC EXTERNAL MEDICATION
1.00 | Status: DC
Start: ? — End: 2019-11-12

## 2019-11-12 MED ORDER — HYDROMORPHONE HCL 2 MG/ML IJ SOLN
2.0000 mg | INTRAMUSCULAR | Status: AC
  Administered 2019-11-12: 2 mg via INTRAVENOUS
  Filled 2019-11-12: qty 1

## 2019-11-12 MED ORDER — NALOXONE HCL 0.4 MG/ML IJ SOLN
0.40 | INTRAMUSCULAR | Status: DC
Start: ? — End: 2019-11-12

## 2019-11-12 MED ORDER — MOMETASONE FURO-FORMOTEROL FUM 100-5 MCG/ACT IN AERO
2.00 | INHALATION_SPRAY | RESPIRATORY_TRACT | Status: DC
Start: 2019-11-12 — End: 2019-11-12

## 2019-11-12 MED ORDER — SENNOSIDES-DOCUSATE SODIUM 8.6-50 MG PO TABS
2.00 | ORAL_TABLET | ORAL | Status: DC
Start: ? — End: 2019-11-12

## 2019-11-12 MED ORDER — DEFERASIROX 360 MG PO TABS
1080.00 | ORAL_TABLET | ORAL | Status: DC
Start: 2019-11-13 — End: 2019-11-12

## 2019-11-12 MED ORDER — CYANOCOBALAMIN 500 MCG PO TABS
1000.00 | ORAL_TABLET | ORAL | Status: DC
Start: 2019-11-13 — End: 2019-11-12

## 2019-11-12 MED ORDER — FOLIC ACID 1 MG PO TABS
1.00 | ORAL_TABLET | ORAL | Status: DC
Start: 2019-11-13 — End: 2019-11-12

## 2019-11-12 MED ORDER — LIDOCAINE HCL 1 % IJ SOLN
0.50 | INTRAMUSCULAR | Status: DC
Start: ? — End: 2019-11-12

## 2019-11-12 MED ORDER — GENERIC EXTERNAL MEDICATION
5000.00 | Status: DC
Start: 2019-11-13 — End: 2019-11-12

## 2019-11-12 MED ORDER — GABAPENTIN 300 MG PO CAPS
300.00 | ORAL_CAPSULE | ORAL | Status: DC
Start: 2019-11-12 — End: 2019-11-12

## 2019-11-12 MED ORDER — HYDROMORPHONE HCL 1 MG/ML IJ SOLN
0.50 | INTRAMUSCULAR | Status: DC
Start: ? — End: 2019-11-12

## 2019-11-12 MED ORDER — DIPHENHYDRAMINE HCL 50 MG/ML IJ SOLN
25.0000 mg | Freq: Once | INTRAMUSCULAR | Status: AC
  Administered 2019-11-12: 25 mg via INTRAVENOUS
  Filled 2019-11-12: qty 1

## 2019-11-12 MED ORDER — HYDROMORPHONE HCL 4 MG PO TABS
4.00 | ORAL_TABLET | ORAL | Status: DC
Start: ? — End: 2019-11-12

## 2019-11-12 MED ORDER — RIVAROXABAN 20 MG PO TABS
20.00 | ORAL_TABLET | ORAL | Status: DC
Start: 2019-11-13 — End: 2019-11-12

## 2019-11-12 MED ORDER — GENERIC EXTERNAL MEDICATION
Status: DC
Start: ? — End: 2019-11-12

## 2019-11-12 MED ORDER — PROMETHAZINE HCL 25 MG PO TABS
12.50 | ORAL_TABLET | ORAL | Status: DC
Start: ? — End: 2019-11-12

## 2019-11-12 MED ORDER — SODIUM CHLORIDE 0.9 % IV SOLN
INTRAVENOUS | Status: DC
Start: ? — End: 2019-11-12

## 2019-11-12 MED ORDER — KETOROLAC TROMETHAMINE 15 MG/ML IJ SOLN
15.0000 mg | INTRAMUSCULAR | Status: AC
  Administered 2019-11-12: 15 mg via INTRAVENOUS
  Filled 2019-11-12: qty 1

## 2019-11-12 NOTE — ED Provider Notes (Signed)
[facility] DEPT Provider Note   CSN: [id number] Arrival date & time: 11/12/19  1509     History No chief complaint on file.   Molly Gutierrez is a 39 y.o. female with PMHx sickle cell anemia, avascular necrosis, and PE currently on Xarelto who presents to the ED with complaint of gradual onset, constant, achy, diffuse chest pain x 1 week. Pt reports this is typical for her sickle cell crisis. She also reports 2 episodes of emesis today. She reports taking home meds including dilaudid at home without relief prompting her to come to the ED. Pt reports compliance with Xarelto. Denies fevers, chills, cough, shortness of breath, abdominal pain, diarrhea, constipation, or any other associated symptoms.   The history is provided by the patient and medical records.       Past Medical History:  Diagnosis Date  . Asthma   . Eczema   . History of pulmonary embolus (PE)   . Sickle cell anemia [facility])     Patient Active Problem List   Diagnosis Date Noted  . Sickle cell crisis acute chest syndrome ([place]) 07/29/2019  . Drug-seeking behavior 07/07/2019  . Sinus tachycardia 07/07/2019  . Therapeutic opioid-induced constipation ([facility]) 07/07/2019  . Breast mass in female 06/29/2019  . Atelectasis of right lung 06/29/2019  . Sickle cell crisis ([place]) 06/27/2019  . Sickle cell pain crisis ([place]) 04/19/2019  . Pneumonia 03/27/2019  . 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 Anterior chest wall pain 11/13/2018  . Epigastric abdominal pain 11/13/2018  . Hematuria 11/13/2018  . Hyperbilirubinemia 11/12/2018  . Long term current use of anticoagulant therapy 11/12/2018  . History of pulmonary embolism 09/26/2018  . Acute pulmonary embolism ([place]) 09/26/2018  . Opioid dependence ([place]) 09/13/2018  . Port-A-Cath in place 09/13/2018  . Narcotic abuse, continuous ([place]) 08/28/2018  . Moderate persistent asthma without complication [id number]  . Anemia 07/03/2018  . Personal  history of other venous thrombosis and embolism 07/03/2018  . History of transfusion 07/03/2018  . Gastro-esophageal reflux disease without esophagitis 06/02/2018  . Asthma 05/19/2018  . Anxiety and depression 10/30/2017  . At risk for sepsis 06/13/2017  . Mitral regurgitation 02/01/2014  . Itching 12/13/2013  . Nausea & vomiting 12/13/2013  . 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 Frequent complaints of pain 11/01/2013  . Hypokalemia 09/19/2013  . S/P total hip arthroplasty 05/19/2013  . 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 Lower urinary tract infectious disease 03/02/2013  . Chest pain 11/14/2012  . Sickle-cell anemia ([place]) 10/30/2012  . Pain management 08/01/2012  . Reticulocytosis 07/31/2012  . 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 Essential (hemorrhagic) thrombocythemia ([place]) 04/03/2012  . Leukocytosis 03/27/2012  . History of gestational diabetes 10/07/2011  . 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 CHOLECYSTECTOMY    . ERCP    . JOINT REPLACEMENT    . PORTA CATH INSERTION    . TUBAL LIGATION    . WISDOM TOOTH EXTRACTION       OB History   No obstetric history on file.     Family History  Problem Relation Age of Onset  . Renal Disease Mother   . Hypertension Mother   . High Cholesterol Mother     Social History   Tobacco Use  . Smoking status: Never Smoker  . Smokeless tobacco: Never Used  Substance Use  Topics  . Alcohol use: Never  . Drug use: Never    Home Medications Prior to Admission medications   Medication Sig Start Date End Date Taking? Authorizing Provider  albuterol (VENTOLIN HFA) 108 (90 Base) MCG/ACT inhaler Inhale 2 puffs into the lungs 2 (two) times daily as needed for wheezing or shortness of breath. 12/31/16  Yes [provider]  celecoxib (CELEBREX)  200 MG capsule Take 200 mg by mouth daily.  10/14/19  Yes [provider]  Cholecalciferol (VITAMIN D3) 25 MCG (1000 UT) CAPS Take 1,000 Units by mouth daily.   Yes [provider]  Deferasirox (JADENU) 360 MG TABS Take 1,080 mg by mouth daily before breakfast. 08/08/19 08/07/20 Yes [provider]  diphenhydrAMINE (BENADRYL) 25 mg capsule Take 25 mg by mouth 3 (three) times daily as needed for itching.   Yes [provider]  folic acid (FOLVITE) 1 MG tablet Take 1 mg by mouth daily. 12/31/16  Yes [provider]  gabapentin (NEURONTIN) 300 MG capsule Take 300 mg by mouth 3 (three) times daily. 10/13/19  Yes [provider]  HYDROmorphone (DILAUDID) 4 MG tablet Take 4 mg by mouth every 6 (six) hours as needed for moderate pain or severe pain.  08/20/19  Yes [provider]  mirtazapine (REMERON) 15 MG tablet Take 45 mg by mouth at bedtime.  08/08/19  Yes [provider]  omeprazole (PRILOSEC) 20 MG capsule Take 20 mg by mouth 2 (two) times a day. 09/08/18  Yes [provider]  promethazine (PHENERGAN) 12.5 MG tablet Take 12.5 mg by mouth every 6 (six) hours as needed for nausea or vomiting.  10/13/19  Yes [provider]  vitamin B-12 (CYANOCOBALAMIN) 1000 MCG tablet Take 1,000 mcg by mouth daily.   Yes [provider]  XARELTO 20 MG TABS tablet Take 1 tablet (20 mg total) by mouth daily. Patient taking differently: Take 20 mg by mouth daily with supper.  05/14/19  Yes Jegede, Marlena Clipper, MD  mometasone-formoterol (DULERA) 100-5 MCG/ACT AERO Inhale 2 puffs into the lungs daily as needed for wheezing or shortness of breath.    [provider]  ondansetron (ZOFRAN) 4 MG tablet Take 1 tablet (4 mg total) by mouth every 8 (eight) hours as needed for nausea or vomiting. Patient not taking: Reported on 11/12/2019 09/15/19   Rolland Porter, MD  oxyCODONE (OXYCONTIN) 10 mg 12 hr tablet Take 1 tablet (10 mg total) by  mouth every 12 (twelve) hours. Patient not taking: Reported on 08/08/2019 08/04/19   [name], FNP  Oxycodone HCl 10 MG TABS Take 1 tablet (10 mg total) by mouth every 4 (four) hours as needed (pain). Every 4 hours while awake Patient not taking: Reported on 09/15/2019 08/04/19   [name], FNP  promethazine (PHENERGAN) 25 MG suppository Place 1 suppository (25 mg total) rectally every 6 (six) hours as needed for nausea or vomiting. Patient not taking: Reported on 11/12/2019 08/14/19   Tedd Sias, PA    Allergies    Cefaclor, Hydroxyurea, and Ketamine  Review of Systems   Review of Systems  Constitutional: Negative for chills and fever.  Respiratory: Negative for cough and shortness of breath.   Cardiovascular: Positive for chest pain.  Gastrointestinal: Positive for nausea and vomiting. Negative for abdominal pain, constipation and diarrhea.  All other systems reviewed and are negative.   Physical Exam Updated Vital Signs BP 107/82   Pulse 84   Temp 99.2 F (37.3 C) (  Oral)   Resp (!) 22   Ht 5\' 3"  (1.6 m)   Wt 55.8 kg   LMP 10/22/2019   SpO2 100%   BMI 21.79 kg/m   Physical Exam Vitals and nursing note reviewed.  Constitutional:      Appearance: She is not ill-appearing or diaphoretic.  HENT:     Head: Normocephalic and atraumatic.  Eyes:     Conjunctiva/sclera: Conjunctivae normal.  Cardiovascular:     Rate and Rhythm: Normal rate and regular rhythm.     Pulses: Normal pulses.  Pulmonary:     Effort: Pulmonary effort is normal.     Breath sounds: Normal breath sounds. No wheezing, rhonchi or rales.  Chest:     Chest wall: Tenderness (diffuse chest wall TTP) present.  Abdominal:     Palpations: Abdomen is soft.     Tenderness: There is no abdominal tenderness. There is no guarding or rebound.  Musculoskeletal:     Cervical back: Neck supple.     Right lower leg: No edema.     Left lower leg: No edema.  Skin:    General: Skin is warm and dry.   Neurological:     Mental Status: She is alert.     ED Results / Procedures / Treatments   Labs (all labs ordered are listed, but only abnormal results are displayed) Labs Reviewed  CBC WITH DIFFERENTIAL/PLATELET - Abnormal; Notable for the following components:      Result Value   WBC 22.6 (*)    RBC 2.82 (*)    Hemoglobin 8.7 (*)    HCT 26.4 (*)    RDW 17.4 (*)    Platelets 635 (*)    Neutro Abs 15.3 (*)    Monocytes Absolute 2.9 (*)    Abs Immature Granulocytes 0.13 (*)    All other components within normal limits  RETICULOCYTES - Abnormal; Notable for the following components:   Retic Ct Pct 5.0 (*)    RBC. 2.80 (*)    All other components within normal limits  COMPREHENSIVE METABOLIC PANEL - Abnormal; Notable for the following components:   Glucose, Bld 103 (*)    Total Protein 8.4 (*)    Total Bilirubin 3.1 (*)    All other components within normal limits  I-STAT BETA HCG BLOOD, ED ([facility], [facility], [facility] ONLY)  TROPONIN I (HIGH SENSITIVITY)    EKG None  Radiology DG Chest 2 View  Result Date: 11/12/2019 CLINICAL DATA:  Sickle cell disease.  Chest pain for 3 days. EXAM: CHEST - 2 VIEW COMPARISON:  09/15/2019 FINDINGS: The heart size and mediastinal contours are within normal limits. Bilateral Port-A-Cath remain in place. Both lungs are clear. The visualized skeletal structures are unremarkable. IMPRESSION: No active cardiopulmonary disease. Electronically Signed   By: Marlaine Hind M.D.   On: 11/12/2019 21:03    Procedures Procedures (including critical care time)  Medications Ordered in ED Medications  ketorolac (TORADOL) 15 MG/ML injection 15 mg (15 mg Intravenous Given 11/12/19 2141)  HYDROmorphone (DILAUDID) injection 2 mg (2 mg Intravenous Given 11/12/19 2142)  HYDROmorphone (DILAUDID) injection 2 mg (2 mg Intravenous Given 11/12/19 2224)  promethazine (PHENERGAN) injection 12.5 mg (12.5 mg Intravenous Given 11/12/19 2142)  diphenhydrAMINE (BENADRYL) injection 25 mg (25  mg Intravenous Given 11/12/19 2250)  oxyCODONE (Oxy IR/ROXICODONE) immediate release tablet 15 mg (15 mg Oral Given 11/12/19 2250)    ED Course  I have reviewed the triage vital signs and the nursing notes.  Pertinent labs &  imaging results that were available during my care of the patient were reviewed by me and considered in my medical decision making (see chart for details).    MDM Rules/Calculators/A&P                      39 year old female with a history of sickle cell anemia presents to the ED today with chest pain which is typical for her sickle cell pain.  Is fevers, chills, cough, shortness of breath at home.  On arrival to the ED patient's temp 99.2 and initially tachycardic in the 110s however when patient was brought back to room she is in the 80s and 90s.  She is currently on Xarelto for PE and denies missing any doses.  On exam she has diffuse chest wall tenderness to palpation.  Lungs are clear to auscultation bilaterally however given complaint of chest pain will obtain chest x-ray to ensure there is no concern for acute chest syndrome.  Will get labs and treat sickle cell pain and reevaluate.   CXR clear CBC with noted leukocytosis 22.6 however pt with similar values in the past.   WBC  Date Value Ref Range Status  11/12/2019 22.6 (H) 4.0 - 10.5 K/uL Final  09/15/2019 21.5 (H) 4.0 - 10.5 K/uL Final  09/09/2019 17.2 (H) 4.0 - 10.5 K/uL Final  09/05/2019 15.5 (H) 4.0 - 10.5 K/uL Final    Comment:    REPEATED TO VERIFY WHITE COUNT CONFIRMED ON SMEAR    Hgb stable at 8.7 CMP without electrolyte abnormalities Reticulocytes unremarkable Troponin of 3, given 2 days of CP do not feel pt needs additional trop at this time  On reeval after 2 doses of dilaudid pt reports continued pain. She is itching as well. Will provide benadryl and additional oral dose of medications and reevaluate  Pt reports improvement in pain and would like to manage it at home at this time. Feel this  is reasonable today. She is resting comfortably in the room watching TV and drinking liquids. Will discharge home with strict return precautions and otherwise PCP follow up at the beginning of the week. Pt is in agreement with plan and stable for discharge home.   This note was prepared using Dragon voice recognition software and may include unintentional dictation errors due to the inherent limitations of voice recognition software.  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

## 2019-11-12 NOTE — Discharge Instructions (Signed)
Continue taking our home medications as indicated Follow up with your PCP regarding your ED visit Return to the ED for any worsening symptoms

## 2019-11-12 NOTE — ED Triage Notes (Signed)
Pt reports sickle cell pains x 3 days.

## 2019-11-13 DIAGNOSIS — D5700 Hb-SS disease with crisis, unspecified: Secondary | ICD-10-CM | POA: Diagnosis not present

## 2019-11-13 MED ORDER — HEPARIN SOD (PORK) LOCK FLUSH 100 UNIT/ML IV SOLN
500.0000 [IU] | Freq: Once | INTRAVENOUS | Status: AC
  Administered 2019-11-13: 500 [IU]
  Filled 2019-11-13: qty 5

## 2020-01-07 ENCOUNTER — Emergency Department (HOSPITAL_COMMUNITY): Payer: Medicare Other

## 2020-01-07 ENCOUNTER — Other Ambulatory Visit: Payer: Self-pay

## 2020-01-07 ENCOUNTER — Encounter (HOSPITAL_COMMUNITY): Payer: Self-pay

## 2020-01-07 ENCOUNTER — Inpatient Hospital Stay (HOSPITAL_COMMUNITY)
Admission: EM | Admit: 2020-01-07 | Discharge: 2020-01-12 | DRG: 812 | Disposition: A | Discharge status: Home / Self Care | Payer: Medicare Other | Attending: Internal Medicine | Admitting: Internal Medicine

## 2020-01-07 DIAGNOSIS — Z888 Allergy status to other drugs, medicaments and biological substances status: Secondary | ICD-10-CM

## 2020-01-07 DIAGNOSIS — Z9049 Acquired absence of other specified parts of digestive tract: Secondary | ICD-10-CM | POA: Diagnosis not present

## 2020-01-07 DIAGNOSIS — Z8249 Family history of ischemic heart disease and other diseases of the circulatory system: Secondary | ICD-10-CM | POA: Diagnosis not present

## 2020-01-07 DIAGNOSIS — Z841 Family history of disorders of kidney and ureter: Secondary | ICD-10-CM

## 2020-01-07 DIAGNOSIS — Z79899 Other long term (current) drug therapy: Secondary | ICD-10-CM | POA: Diagnosis not present

## 2020-01-07 DIAGNOSIS — F329 Major depressive disorder, single episode, unspecified: Secondary | ICD-10-CM | POA: Diagnosis not present

## 2020-01-07 DIAGNOSIS — Z83438 Family history of other disorder of lipoprotein metabolism and other lipidemia: Secondary | ICD-10-CM

## 2020-01-07 DIAGNOSIS — J4520 Mild intermittent asthma, uncomplicated: Secondary | ICD-10-CM | POA: Diagnosis not present

## 2020-01-07 DIAGNOSIS — F419 Anxiety disorder, unspecified: Secondary | ICD-10-CM | POA: Diagnosis present

## 2020-01-07 DIAGNOSIS — Z79891 Long term (current) use of opiate analgesic: Secondary | ICD-10-CM | POA: Diagnosis not present

## 2020-01-07 DIAGNOSIS — Z7901 Long term (current) use of anticoagulants: Secondary | ICD-10-CM | POA: Diagnosis not present

## 2020-01-07 DIAGNOSIS — D5700 Hb-SS disease with crisis, unspecified: Secondary | ICD-10-CM | POA: Diagnosis present

## 2020-01-07 DIAGNOSIS — D638 Anemia in other chronic diseases classified elsewhere: Secondary | ICD-10-CM | POA: Diagnosis present

## 2020-01-07 DIAGNOSIS — F32A Depression, unspecified: Secondary | ICD-10-CM | POA: Diagnosis present

## 2020-01-07 DIAGNOSIS — G894 Chronic pain syndrome: Secondary | ICD-10-CM | POA: Diagnosis not present

## 2020-01-07 DIAGNOSIS — K219 Gastro-esophageal reflux disease without esophagitis: Secondary | ICD-10-CM | POA: Diagnosis not present

## 2020-01-07 DIAGNOSIS — Z86711 Personal history of pulmonary embolism: Secondary | ICD-10-CM | POA: Diagnosis not present

## 2020-01-07 DIAGNOSIS — Z20822 Contact with and (suspected) exposure to covid-19: Secondary | ICD-10-CM | POA: Diagnosis not present

## 2020-01-07 DIAGNOSIS — D72829 Elevated white blood cell count, unspecified: Secondary | ICD-10-CM | POA: Diagnosis not present

## 2020-01-07 LAB — CBC WITH DIFFERENTIAL/PLATELET
Abs Immature Granulocytes: 0.1 10*3/uL — ABNORMAL HIGH (ref 0.00–0.07)
Basophils Absolute: 0.1 10*3/uL (ref 0.0–0.1)
Basophils Relative: 1 %
Eosinophils Absolute: 0.3 10*3/uL (ref 0.0–0.5)
Eosinophils Relative: 1 %
HCT: 24.3 % — ABNORMAL LOW (ref 36.0–46.0)
Hemoglobin: 8.2 g/dL — ABNORMAL LOW (ref 12.0–15.0)
Immature Granulocytes: 1 %
Lymphocytes Relative: 26 %
Lymphs Abs: 4.8 10*3/uL — ABNORMAL HIGH (ref 0.7–4.0)
MCH: 30.3 pg (ref 26.0–34.0)
MCHC: 33.7 g/dL (ref 30.0–36.0)
MCV: 89.7 fL (ref 80.0–100.0)
Monocytes Absolute: 2.6 10*3/uL — ABNORMAL HIGH (ref 0.1–1.0)
Monocytes Relative: 14 %
Neutro Abs: 10.5 10*3/uL — ABNORMAL HIGH (ref 1.7–7.7)
Neutrophils Relative %: 57 %
Platelets: 518 10*3/uL — ABNORMAL HIGH (ref 150–400)
RBC: 2.71 MIL/uL — ABNORMAL LOW (ref 3.87–5.11)
RDW: 16.5 % — ABNORMAL HIGH (ref 11.5–15.5)
WBC: 18.4 10*3/uL — ABNORMAL HIGH (ref 4.0–10.5)
nRBC: 0.1 % (ref 0.0–0.2)

## 2020-01-07 LAB — I-STAT CHEM 8, ED
BUN: 13 mg/dL (ref 6–20)
Calcium, Ion: 1.22 mmol/L (ref 1.15–1.40)
Chloride: 107 mmol/L (ref 98–111)
Creatinine, Ser: 0.5 mg/dL (ref 0.44–1.00)
Glucose, Bld: 101 mg/dL — ABNORMAL HIGH (ref 70–99)
HCT: 28 % — ABNORMAL LOW (ref 36.0–46.0)
Hemoglobin: 9.5 g/dL — ABNORMAL LOW (ref 12.0–15.0)
Potassium: 3.5 mmol/L (ref 3.5–5.1)
Sodium: 142 mmol/L (ref 135–145)
TCO2: 22 mmol/L (ref 22–32)

## 2020-01-07 LAB — COMPREHENSIVE METABOLIC PANEL
ALT: 19 U/L (ref 0–44)
AST: 28 U/L (ref 15–41)
Albumin: 4.6 g/dL (ref 3.5–5.0)
Alkaline Phosphatase: 73 U/L (ref 38–126)
Anion gap: 10 (ref 5–15)
BUN: 13 mg/dL (ref 6–20)
CO2: 21 mmol/L — ABNORMAL LOW (ref 22–32)
Calcium: 9.1 mg/dL (ref 8.9–10.3)
Chloride: 106 mmol/L (ref 98–111)
Creatinine, Ser: 0.54 mg/dL (ref 0.44–1.00)
GFR calc Af Amer: >60 mL/min (ref >60)
GFR calc non Af Amer: >60 mL/min (ref >60)
Glucose, Bld: 102 mg/dL — ABNORMAL HIGH (ref 70–99)
Potassium: 3.6 mmol/L (ref 3.5–5.1)
Sodium: 137 mmol/L (ref 135–145)
Total Bilirubin: 3.4 mg/dL — ABNORMAL HIGH (ref 0.3–1.2)
Total Protein: 8.4 g/dL — ABNORMAL HIGH (ref 6.5–8.1)

## 2020-01-07 LAB — RETICULOCYTES
Immature Retic Fract: 11.3 % (ref 2.3–15.9)
RBC.: 2.72 MIL/uL — ABNORMAL LOW (ref 3.87–5.11)
Retic Count, Absolute: 240.7 10*3/uL — ABNORMAL HIGH (ref 19.0–186.0)
Retic Ct Pct: 8.9 % — ABNORMAL HIGH (ref 0.4–3.1)

## 2020-01-07 LAB — SARS CORONAVIRUS 2 BY RT PCR (HOSPITAL ORDER, PERFORMED IN ~~LOC~~ HOSPITAL LAB): SARS Coronavirus 2: NEGATIVE

## 2020-01-07 LAB — I-STAT BETA HCG BLOOD, ED (MC, WL, AP ONLY): I-stat hCG, quantitative: <5 m[IU]/mL (ref <5)

## 2020-01-07 MED ORDER — HYDROMORPHONE 1 MG/ML IV SOLN
INTRAVENOUS | Status: DC
  Administered 2020-01-07: 0.5 mg via INTRAVENOUS
  Administered 2020-01-07: 1.5 mg via INTRAVENOUS
  Administered 2020-01-07: 30 mg via INTRAVENOUS
  Administered 2020-01-08: 2 mg via INTRAVENOUS
  Administered 2020-01-08 (×2): 2.5 mg via INTRAVENOUS
  Administered 2020-01-08: 0.5 mg via INTRAVENOUS
  Administered 2020-01-08: 3 mg via INTRAVENOUS
  Administered 2020-01-08: 2 mg via INTRAVENOUS
  Administered 2020-01-09: 7 mg via INTRAVENOUS
  Administered 2020-01-09: 2.5 mg via INTRAVENOUS
  Administered 2020-01-09 (×2): 1.5 mg via INTRAVENOUS
  Administered 2020-01-09: 30 mg via INTRAVENOUS
  Administered 2020-01-09: 3 mg via INTRAVENOUS
  Administered 2020-01-10: 0.5 mg via INTRAVENOUS
  Administered 2020-01-10: 3 mg via INTRAVENOUS
  Filled 2020-01-07 (×2): qty 30

## 2020-01-07 MED ORDER — HYDROMORPHONE HCL 2 MG/ML IJ SOLN
2.0000 mg | INTRAMUSCULAR | Status: AC
  Administered 2020-01-07: 2 mg via INTRAVENOUS
  Filled 2020-01-07: qty 1

## 2020-01-07 MED ORDER — DIPHENHYDRAMINE HCL 50 MG/ML IJ SOLN
12.5000 mg | Freq: Once | INTRAMUSCULAR | Status: AC
  Administered 2020-01-07: 12.5 mg via INTRAVENOUS
  Filled 2020-01-07: qty 1

## 2020-01-07 MED ORDER — ONDANSETRON HCL 4 MG/2ML IJ SOLN
4.0000 mg | INTRAMUSCULAR | Status: DC | PRN
  Administered 2020-01-07: 4 mg via INTRAVENOUS
  Filled 2020-01-07: qty 2

## 2020-01-07 MED ORDER — KETOROLAC TROMETHAMINE 15 MG/ML IJ SOLN
15.0000 mg | INTRAMUSCULAR | Status: AC
  Administered 2020-01-07: 15 mg via INTRAVENOUS
  Filled 2020-01-07: qty 1

## 2020-01-07 MED ORDER — OXYCODONE HCL ER 10 MG PO T12A
10.0000 mg | EXTENDED_RELEASE_TABLET | Freq: Two times a day (BID) | ORAL | Status: DC
  Administered 2020-01-07 – 2020-01-12 (×11): 10 mg via ORAL
  Filled 2020-01-07 (×11): qty 1

## 2020-01-07 MED ORDER — CELECOXIB 200 MG PO CAPS
200.0000 mg | ORAL_CAPSULE | Freq: Every day | ORAL | Status: DC
  Administered 2020-01-07 – 2020-01-12 (×6): 200 mg via ORAL
  Filled 2020-01-07 (×6): qty 1

## 2020-01-07 MED ORDER — DIPHENHYDRAMINE HCL 25 MG PO CAPS
25.0000 mg | ORAL_CAPSULE | ORAL | Status: DC | PRN
  Administered 2020-01-07 (×2): 25 mg via ORAL
  Filled 2020-01-07 (×2): qty 1

## 2020-01-07 MED ORDER — SODIUM CHLORIDE 0.45 % IV SOLN: INTRAVENOUS | Status: DC

## 2020-01-07 MED ORDER — MOMETASONE FURO-FORMOTEROL FUM 100-5 MCG/ACT IN AERO: 2.0000 | INHALATION_SPRAY | Freq: Every day | RESPIRATORY_TRACT | Status: DC | PRN

## 2020-01-07 MED ORDER — SODIUM CHLORIDE 0.9% FLUSH: 9.0000 mL | INTRAVENOUS | Status: DC | PRN

## 2020-01-07 MED ORDER — VITAMIN B-12 1000 MCG PO TABS
1000.0000 ug | ORAL_TABLET | Freq: Every day | ORAL | Status: DC
  Administered 2020-01-07 – 2020-01-12 (×6): 1000 ug via ORAL
  Filled 2020-01-07 (×6): qty 1

## 2020-01-07 MED ORDER — DEXTROSE-NACL 5-0.45 % IV SOLN: INTRAVENOUS | Status: DC

## 2020-01-07 MED ORDER — HYDROMORPHONE HCL 2 MG/ML IJ SOLN
2.0000 mg | Freq: Once | INTRAMUSCULAR | Status: AC
  Administered 2020-01-07: 2 mg via INTRAVENOUS
  Filled 2020-01-07: qty 1

## 2020-01-07 MED ORDER — KETOROLAC TROMETHAMINE 15 MG/ML IJ SOLN
15.0000 mg | Freq: Four times a day (QID) | INTRAMUSCULAR | Status: AC
  Administered 2020-01-07 – 2020-01-12 (×19): 15 mg via INTRAVENOUS
  Filled 2020-01-07 (×19): qty 1

## 2020-01-07 MED ORDER — SODIUM CHLORIDE 0.9 % IV SOLN
25.0000 mg | INTRAVENOUS | Status: DC | PRN
  Administered 2020-01-08: 25 mg via INTRAVENOUS
  Filled 2020-01-07: qty 25
  Filled 2020-01-07 (×2): qty 0.5

## 2020-01-07 MED ORDER — ALBUTEROL SULFATE (2.5 MG/3ML) 0.083% IN NEBU: 3.0000 mL | INHALATION_SOLUTION | Freq: Two times a day (BID) | RESPIRATORY_TRACT | Status: DC | PRN

## 2020-01-07 MED ORDER — SODIUM CHLORIDE (PF) 0.9 % IJ SOLN
INTRAMUSCULAR | Status: AC
  Filled 2020-01-07: qty 50

## 2020-01-07 MED ORDER — IOHEXOL 350 MG/ML SOLN
100.0000 mL | Freq: Once | INTRAVENOUS | Status: AC | PRN
  Administered 2020-01-07: 80 mL via INTRAVENOUS

## 2020-01-07 MED ORDER — DEFERASIROX 360 MG PO TABS: 1080.0000 mg | ORAL_TABLET | Freq: Every day | ORAL | Status: DC

## 2020-01-07 MED ORDER — PANTOPRAZOLE SODIUM 40 MG PO TBEC
40.0000 mg | DELAYED_RELEASE_TABLET | Freq: Every day | ORAL | Status: DC
  Administered 2020-01-07 – 2020-01-12 (×6): 40 mg via ORAL
  Filled 2020-01-07 (×6): qty 1

## 2020-01-07 MED ORDER — GABAPENTIN 300 MG PO CAPS
300.0000 mg | ORAL_CAPSULE | Freq: Three times a day (TID) | ORAL | Status: DC
  Administered 2020-01-07 – 2020-01-12 (×16): 300 mg via ORAL
  Filled 2020-01-07 (×16): qty 1

## 2020-01-07 MED ORDER — SENNOSIDES-DOCUSATE SODIUM 8.6-50 MG PO TABS
1.0000 | ORAL_TABLET | Freq: Two times a day (BID) | ORAL | Status: DC
  Administered 2020-01-07 – 2020-01-12 (×10): 1 via ORAL
  Filled 2020-01-07 (×11): qty 1

## 2020-01-07 MED ORDER — OXYCODONE HCL 5 MG PO TABS
15.0000 mg | ORAL_TABLET | Freq: Once | ORAL | Status: AC
  Administered 2020-01-07: 15 mg via ORAL
  Filled 2020-01-07: qty 3

## 2020-01-07 MED ORDER — MIRTAZAPINE 30 MG PO TABS
45.0000 mg | ORAL_TABLET | Freq: Every day | ORAL | Status: DC
  Administered 2020-01-07 – 2020-01-11 (×5): 45 mg via ORAL
  Filled 2020-01-07 (×5): qty 1

## 2020-01-07 MED ORDER — DIPHENHYDRAMINE HCL 25 MG PO CAPS
25.0000 mg | ORAL_CAPSULE | ORAL | Status: DC | PRN
  Filled 2020-01-07 (×2): qty 1

## 2020-01-07 MED ORDER — RIVAROXABAN 20 MG PO TABS
20.0000 mg | ORAL_TABLET | Freq: Every day | ORAL | Status: DC
  Administered 2020-01-07 – 2020-01-12 (×6): 20 mg via ORAL
  Filled 2020-01-07 (×6): qty 1

## 2020-01-07 MED ORDER — ONDANSETRON HCL 4 MG/2ML IJ SOLN: 4.0000 mg | Freq: Four times a day (QID) | INTRAMUSCULAR | Status: DC | PRN

## 2020-01-07 MED ORDER — VITAMIN D 25 MCG (1000 UNIT) PO TABS
1000.0000 [IU] | ORAL_TABLET | Freq: Every day | ORAL | Status: DC
  Administered 2020-01-07 – 2020-01-12 (×6): 1000 [IU] via ORAL
  Filled 2020-01-07 (×6): qty 1

## 2020-01-07 MED ORDER — POLYETHYLENE GLYCOL 3350 17 G PO PACK: 17.0000 g | PACK | Freq: Every day | ORAL | Status: DC | PRN

## 2020-01-07 MED ORDER — FOLIC ACID 1 MG PO TABS
1.0000 mg | ORAL_TABLET | Freq: Every day | ORAL | Status: DC
  Administered 2020-01-07 – 2020-01-12 (×6): 1 mg via ORAL
  Filled 2020-01-07 (×6): qty 1

## 2020-01-07 MED ORDER — NALOXONE HCL 0.4 MG/ML IJ SOLN: 0.4000 mg | INTRAMUSCULAR | Status: DC | PRN

## 2020-01-07 NOTE — ED Notes (Signed)
ED TO INPATIENT HANDOFF REPORT  Name/Age/Gender [facility] 39 y.o. female  Code Status Code Status History    Date Active Date Inactive Code Status Order ID Comments User Context   07/29/2019 0312 08/04/2019 1845 Full Code [id number]  Molly Lynch, DO ED   06/27/2019 0602 07/02/2019 2229 Full Code [id number]  Molly Cowboy, MD ED   05/26/2019 1646 06/01/2019 2241 Full Code [id number]  Molly Dew, FNP Inpatient   05/05/2019 0116 05/15/2019 2110 Full Code [id number]  Molly Leff, MD Inpatient   04/19/2019 1144 04/26/2019 2031 Full Code [id number]  Molly Dew, FNP Inpatient   Advance Care Planning Activity    Questions for Most Recent Historical Code Status (Order [id number])       Home/SNF/Other Home  Chief Complaint Sickle cell anemia with crisis ([place]) [D57.00]  Level of Care/Admitting Diagnosis ED Disposition    ED Disposition Condition [place]: [facility] [100102]  Level of Care: Med-Surg [16]  May admit patient to [facility] or [facility]ierrez if equivalent level of care is available:: No  Covid Evaluation: Confirmed COVID Negative  Diagnosis: Sickle cell anemia with crisis [facility]) [[id number]]  Admitting Physician: [name] [[id number]]  Attending Physician: [name] [[id number]]  Estimated length of stay: past midnight tomorrow  Certification:: I certify this patient will need inpatient services for at least 2 midnights  Bed request comments: [place] History Past Medical History:  Diagnosis Date  . Asthma   . Eczema   . History of pulmonary embolus (PE)   . Sickle cell anemia (HCC)     Allergies Allergies  Allergen Reactions  . Cefaclor Hives and Swelling  . Hydroxyurea Palpitations and Other (See Comments)    Lowers "blood levels" and heart rate (causes HYPOtension); "it messes me up, it drops my levels and stuff"  Other reaction(s): Hypotension, Other (See  Comments) "it messes me up, it drops my levels and stuff" Lower blood levels and HR  Other reaction(s): Other (See Comments) Lowers "blood levels" and heart rate (causes HYPOtension); "it messes me up, it drops my levels and stuff"  . Ketamine Palpitations and Other (See Comments)    "Pt states she has had previous reaction to ketamine. States she becomes flushed, heart races, dizzy, and feels like she is going to pass out." Other reaction(s): Other (See Comments), Other (See Comments) "Pt states she has had previous reaction to ketamine. States she becomes flushed, heart races, dizzy, and feels like she is going to pass out." "Pt states she has had previous reaction to ketamine. States she becomes flushed, heart races, dizzy, and feels like she is going to pass out."    IV Location/Drains/Wounds Patient Lines/Drains/Airways Status    Active Line/Drains/Airways    Name Placement date Placement time Site Days   Implanted Port 01/06/20 01/06/20  0542  --  1          Labs/Imaging Results for orders placed or performed during the hospital encounter of 01/07/20 (from the past 48 hour(s))  Comprehensive metabolic panel     Status: Abnormal   Collection Time: 01/07/20  2:19 AM  Result Value Ref Range   Sodium 137 135 - 145 mmol/L   Potassium 3.6 3.5 - 5.1 mmol/L   Chloride 106 98 - 111 mmol/L   CO2 21 (L) 22 - 32 mmol/L   Glucose, Bld 102 (H) 70 - 99 mg/dL  Comment: Glucose reference range applies only to samples taken after fasting for at least 8 hours.   BUN 13 6 - 20 mg/dL   Creatinine, Ser 0.54 0.44 - 1.00 mg/dL   Calcium 9.1 8.9 - 10.3 mg/dL   Total Protein 8.4 (H) 6.5 - 8.1 g/dL   Albumin 4.6 3.5 - 5.0 g/dL   AST 28 15 - 41 U/L   ALT 19 0 - 44 U/L   Alkaline Phosphatase 73 38 - 126 U/L   Total Bilirubin 3.4 (H) 0.3 - 1.2 mg/dL   GFR calc non Af Amer >60 >60 mL/min   GFR calc Af Amer >60 >60 mL/min   Anion gap 10 5 - 15    Comment: Performed at [facility], [place] [address], [place], [place] [address]  CBC with Differential     Status: Abnormal   Collection Time: 01/07/20  2:19 AM  Result Value Ref Range   WBC 18.4 (H) 4.0 - 10.5 K/uL   RBC 2.71 (L) 3.87 - 5.11 MIL/uL   Hemoglobin 8.2 (L) 12.0 - 15.0 g/dL   HCT 24.3 (L) 36 - 46 %   MCV 89.7 80.0 - 100.0 fL   MCH 30.3 26.0 - 34.0 pg   MCHC 33.7 30.0 - 36.0 g/dL   RDW 16.5 (H) 11.5 - 15.5 %   Platelets 518 (H) 150 - 400 K/uL   nRBC 0.1 0.0 - 0.2 %   Neutrophils Relative % 57 %   Neutro Abs 10.5 (H) 1.7 - 7.7 K/uL   Lymphocytes Relative 26 %   Lymphs Abs 4.8 (H) 0.7 - 4.0 K/uL   Monocytes Relative 14 %   Monocytes Absolute 2.6 (H) 0 - 1 K/uL   Eosinophils Relative 1 %   Eosinophils Absolute 0.3 0 - 0 K/uL   Basophils Relative 1 %   Basophils Absolute 0.1 0 - 0 K/uL   Immature Granulocytes 1 %   Abs Immature Granulocytes 0.10 (H) 0.00 - 0.07 K/uL    Comment: Performed at [facility], [place] [address], [place], [place] [address]  Reticulocytes     Status: Abnormal   Collection Time: 01/07/20  2:19 AM  Result Value Ref Range   Retic Ct Pct 8.9 (H) 0.4 - 3.1 %   RBC. 2.72 (L) 3.87 - 5.11 MIL/uL   Retic Count, Absolute 240.7 (H) 19.0 - 186.0 K/uL   Immature Retic Fract 11.3 2.3 - 15.9 %    Comment: Performed at [facility], [place] [address], [place],  [address]  I-stat chem 8, ED (not at [facility] or [facility])     Status: Abnormal   Collection Time: 01/07/20  5:35 AM  Result Value Ref Range   Sodium 142 135 - 145 mmol/L   Potassium 3.5 3.5 - 5.1 mmol/L   Chloride 107 98 - 111 mmol/L   BUN 13 6 - 20 mg/dL   Creatinine, Ser 0.50 0.44 - 1.00 mg/dL   Glucose, Bld 101 (H) 70 - 99 mg/dL    Comment: Glucose reference range applies only to samples taken after fasting for at least 8 hours.   Calcium, Ion 1.22 1.15 - 1.40 mmol/L   TCO2 22 22 - 32 mmol/L   Hemoglobin 9.5 (L) 12.0 - 15.0 g/dL   HCT 28.0 (L) 36 - 46 %  I-Stat beta hCG blood, ED      Status: None   Collection Time: 01/07/20  5:45 AM  Result Value Ref Range  I-stat hCG, quantitative <5.0 <5 mIU/mL   Comment 3            Comment:   GEST. AGE      CONC.  (mIU/mL)   <=1 WEEK        5 - 50     2 WEEKS       50 - 500     3 WEEKS       100 - 10,000     4 WEEKS     1,000 - 30,000        FEMALE AND NON-PREGNANT FEMALE:     LESS THAN 5 mIU/mL   SARS Coronavirus 2 by RT PCR (hospital order, performed in [place] hospital lab) Nasopharyngeal Nasopharyngeal Swab     Status: None   Collection Time: 01/07/20  9:28 AM   Specimen: Nasopharyngeal Swab  Result Value Ref Range   SARS Coronavirus 2 NEGATIVE NEGATIVE    Comment: (NOTE) SARS-CoV-2 target nucleic acids are NOT DETECTED.  The SARS-CoV-2 RNA is generally detectable in upper and lower respiratory specimens during the acute phase of infection. The lowest concentration of SARS-CoV-2 viral copies this assay can detect is 250 copies / mL. A negative result does not preclude SARS-CoV-2 infection and should not be used as the sole basis for treatment or other patient management decisions.  A negative result may occur with improper specimen collection / handling, submission of specimen other than nasopharyngeal swab, presence of viral mutation(s) within the areas targeted by this assay, and inadequate number of viral copies (<250 copies / mL). A negative result must be combined with clinical observations, patient history, and epidemiological information.  Fact Sheet for Patients:   StrictlyIdeas.no  Fact Sheet for Healthcare Providers: BankingDealers.co.za  This test is not yet approved or  cleared by the [place] FDA and has been authorized for detection and/or diagnosis of SARS-CoV-2 by FDA under an Emergency Use Authorization (EUA).  This EUA will remain in effect (meaning this test can be used) for the duration of the COVID-19 declaration under Section 564(b)(1)  of the Act, 21 U.S.C. section 360bbb-3(b)(1), unless the authorization is terminated or revoked sooner.  Performed at [facility], [place] [address], [place], [place] [address]    CT Angio Chest PE W and/or Wo Contrast  Result Date: 01/07/2020 CLINICAL DATA:  Chest pain and shortness of breath. Sickle cell disease. EXAM: CT ANGIOGRAPHY CHEST WITH CONTRAST TECHNIQUE: Multidetector CT imaging of the chest was performed using the standard protocol during bolus administration of intravenous contrast. Multiplanar CT image reconstructions and MIPs were obtained to evaluate the vascular anatomy. CONTRAST:  61mL OMNIPAQUE IOHEXOL 350 MG/ML SOLN COMPARISON:  07/07/2019 FINDINGS: Cardiovascular: The heart is mildly enlarged but stable. No pericardial effusion. The aorta is normal in caliber. No dissection or atherosclerotic calcifications. No definite coronary artery calcifications. The pulmonary arterial tree is well opacified. No filling defects to suggest pulmonary embolism. Bilateral Port-A-Cath are noted. Mediastinum/Nodes: Small scattered mediastinal and hilar lymph nodes are stable. No mass or overt adenopathy. The esophagus is grossly normal. Lungs/Pleura: Moderate vascular congestion and patchy ground-glass opacity suggesting mild edema or alveolitis. No focal airspace consolidation or pleural effusions. No pneumothorax. No worrisome pulmonary lesions. There is a stable right paraspinal mass at T8-9 most likely an area of extra medullary hematopoiesis. Upper Abdomen: No significant upper abdominal findings. 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 Musculoskeletal: Stable macro calcifications in the right breast. No supraclavicular or axillary adenopathy. The bony structures  are intact. Typical findings of sickle cell changes involving the osseous structures. Review of the MIP images confirms the above findings. IMPRESSION: 1. No CT findings for pulmonary embolism. 2. Normal thoracic  aorta. 3. Moderate vascular congestion and patchy ground-glass opacity suggesting mild edema or alveolitis. 4. 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 Electronically Signed   By: Marijo Sanes M.D.   On: 01/07/2020 07:51   DG Chest Portable 1 View  Result Date: 01/07/2020 CLINICAL DATA:  Chest pain. Shortness of breath. Sickle cell disease. EXAM: PORTABLE CHEST 1 VIEW COMPARISON:  Chest x-ray 11/12/2019 FINDINGS: Bilateral Port-A-Cath is in good position without complicating features. Low lung volumes with vascular crowding and streaky basilar atelectasis. No infiltrates or effusions. Borderline cardiac enlargement extension weighted by the AP portable technique and by the low lung volumes. The bony structures are intact. IMPRESSION: Low lung volumes with vascular crowding and streaky basilar atelectasis. Electronically Signed   By: Marijo Sanes M.D.   On: 01/07/2020 06:35    Pending Labs [employer] (From admission, onward) Comment          Start     Ordered   Signed and Held  CBC with Differential/Platelet  Tomorrow morning,   R        Signed and Held   Signed and Held  Comprehensive metabolic panel  Tomorrow morning,   R        Signed and Held          Vitals/Pain Today's Vitals   01/07/20 1230 01/07/20 1245 01/07/20 1254 01/07/20 1300  BP: 95/78 92/73 97/66  106/75  Pulse: 79 82 82 94  Resp:   18   Temp:      TempSrc:      SpO2: 90% 93% 94% 91%  Weight:      Height:      PainSc:        Isolation Precautions No active isolations  Medications Medications  diphenhydrAMINE (BENADRYL) capsule 25-50 mg (25 mg Oral Given 01/07/20 0622)  ondansetron (ZOFRAN) injection 4 mg (4 mg Intravenous Given 01/07/20 0540)  dextrose 5 %-0.45 % sodium chloride infusion ( Intravenous New Bag/Given 01/07/20 0543)  sodium chloride (PF) 0.9 % injection (  Canceled Entry 01/07/20 0921)  ketorolac (TORADOL) 15 MG/ML injection 15 mg (15 mg  Intravenous Given 01/07/20 0541)  HYDROmorphone (DILAUDID) injection 2 mg (2 mg Intravenous Given 01/07/20 0541)  HYDROmorphone (DILAUDID) injection 2 mg (2 mg Intravenous Given 01/07/20 0622)  oxyCODONE (Oxy IR/ROXICODONE) immediate release tablet 15 mg (15 mg Oral Given 01/07/20 0656)  iohexol (OMNIPAQUE) 350 MG/ML injection 100 mL (80 mLs Intravenous Contrast Given 01/07/20 0716)  diphenhydrAMINE (BENADRYL) injection 12.5 mg (12.5 mg Intravenous Given 01/07/20 0737)  HYDROmorphone (DILAUDID) injection 2 mg (2 mg Intravenous Given 01/07/20 1135)  diphenhydrAMINE (BENADRYL) injection 12.5 mg (12.5 mg Intravenous Given 01/07/20 1136)    Mobility walks

## 2020-01-07 NOTE — ED Notes (Signed)
Pt transported to CT ?

## 2020-01-07 NOTE — Plan of Care (Signed)

## 2020-01-07 NOTE — H&P (Signed)
H&P  Patient Demographics:  Molly Gutierrez, is a 39 y.o. female  MRN: [id number]   DOB - [birth year]  Admit Date - 01/07/2020  Outpatient Primary MD for the patient is Vevelyn Francois, NP  Chief Complaint  Patient presents with  . Sickle Cell Pain Crisis      HPI:   Molly Gutierrez  is a 39 y.o. female with medical history of sickle cell disease, chronic pain syndrome, anemia of chronic disease, history of PE on Xarelto, history of intermittent asthma and eczema, who is opiate tolerant and dependent presented to the emergency room this morning with major complaints of pain especially in her chest area.  She claims her pain has been ongoing for few days and no longer responding to her home pain medications. She claims this pain is typical of her sickle cell pain crisis. She describes her pain as constant and throbbing, all over but mostly in her lower extremities and her sternal chest area. She rates her pain at 10/10 despite repeated treatment in the emergency room. She denies any cough or shortness of breath. She denies any dizziness or headache, no fever, no contact with anyone coughing.  She denies any contact with COVID-19.  ED course: Vital signs recorded: BP (!) 121/91 (BP Location: Left Arm)   Pulse 83   Temp 98.5 F (36.9 C) (Oral)   Resp 18   Ht 5\' 3"  (1.6 m)   Wt 57.6 kg   LMP 12/29/2019   SpO2 97%   BMI 22.50 kg/m.  Patient was found to be afebrile, in no acute distress.  COVID-19 negative, hemoglobin of 8.2 which is at patient's baseline, WBC count elevated to 18.4, comprehensive metabolic panel was largely within normal limits. CT angiogram showed no CT finding for pulmonary embolism, moderate vascular congestion and patchy groundglass opacity suggesting mild edema or alveolitis. After repeated doses of IV Dilaudid for pain, patient reported no relief, patient will be admitted to the hospital for further evaluation and management of sickle cell pain crisis.   Review  of systems:  In addition to the HPI above, patient reports No fever or chills No Headache, No changes with vision or hearing No problems swallowing food or liquids No cough or shortness of breath No Abdominal pain, No Nausea or Vomiting, Bowel movements are regular No blood in stool or urine No dysuria No new skin rashes or bruises No new joints pains-aches No new weakness, tingling, numbness in any extremity No recent weight gain or loss No polyuria, polydypsia or polyphagia No significant Mental Stressors  A full 10 point Review of Systems was done, except as stated above, all other Review of Systems were negative.  With Past History of the following :   Past Medical History:  Diagnosis Date  . Asthma   . Eczema   . History of pulmonary embolus (PE)   . Sickle cell anemia (HCC)       Past Surgical History:  Procedure Laterality Date  . CHOLECYSTECTOMY    . ERCP    . JOINT REPLACEMENT    . PORTA CATH INSERTION    . TUBAL LIGATION    . WISDOM TOOTH EXTRACTION       Social History:   Social History   Tobacco Use  . Smoking status: Never Smoker  . Smokeless tobacco: Never Used  Substance Use Topics  . Alcohol use: Never     Lives - At home   Family History :   Family History  Problem  Relation Age of Onset  . Renal Disease Mother   . Hypertension Mother   . High Cholesterol Mother      Home Medications:   Prior to Admission medications   Medication Sig Start Date End Date Taking? Authorizing Provider  albuterol (VENTOLIN HFA) 108 (90 Base) MCG/ACT inhaler Inhale 2 puffs into the lungs 2 (two) times daily as needed for wheezing or shortness of breath. 12/31/16  Yes [provider]  celecoxib (CELEBREX) 200 MG capsule Take 200 mg by mouth daily.  10/14/19  Yes [provider]  Cholecalciferol (VITAMIN D3) 25 MCG (1000 UT) CAPS Take 1,000 Units by mouth daily.   Yes [provider]  Deferasirox (JADENU) 360 MG TABS Take 1,080 mg by  mouth daily before breakfast. 08/08/19 08/07/20 Yes [provider]  diphenhydrAMINE (BENADRYL) 25 mg capsule Take 25 mg by mouth 3 (three) times daily as needed for itching.   Yes [provider]  folic acid (FOLVITE) 1 MG tablet Take 1 mg by mouth daily. 12/31/16  Yes [provider]  gabapentin (NEURONTIN) 300 MG capsule Take 300 mg by mouth 3 (three) times daily. 10/13/19  Yes [provider]  HYDROmorphone (DILAUDID) 4 MG tablet Take 4 mg by mouth every 6 (six) hours as needed for moderate pain or severe pain.  08/20/19  Yes [provider]  mirtazapine (REMERON) 15 MG tablet Take 45 mg by mouth at bedtime.  08/08/19  Yes [provider]  mometasone-formoterol (DULERA) 100-5 MCG/ACT AERO Inhale 2 puffs into the lungs daily as needed for wheezing or shortness of breath.   Yes [provider]  omeprazole (PRILOSEC) 20 MG capsule Take 20 mg by mouth 2 (two) times a day. 09/08/18  Yes [provider]  vitamin B-12 (CYANOCOBALAMIN) 1000 MCG tablet Take 1,000 mcg by mouth daily.   Yes [provider]  XARELTO 20 MG TABS tablet Take 1 tablet (20 mg total) by mouth daily. Patient taking differently: Take 20 mg by mouth daily with supper.  05/14/19  Yes [name], MD  ondansetron (ZOFRAN) 4 MG tablet Take 1 tablet (4 mg total) by mouth every 8 (eight) hours as needed for nausea or vomiting. Patient not taking: Reported on 11/12/2019 09/15/19   Rolland Porter, MD  oxyCODONE (OXYCONTIN) 10 mg 12 hr tablet Take 1 tablet (10 mg total) by mouth every 12 (twelve) hours. Patient not taking: Reported on 08/08/2019 08/04/19   [name], FNP  Oxycodone HCl 10 MG TABS Take 1 tablet (10 mg total) by mouth every 4 (four) hours as needed (pain). Every 4 hours while awake Patient not taking: Reported on 09/15/2019 08/04/19   [name], FNP  promethazine (PHENERGAN) 25 MG suppository Place 1 suppository (25 mg total) rectally  every 6 (six) hours as needed for nausea or vomiting. Patient not taking: Reported on 11/12/2019 08/14/19   Tedd Sias, PA     Allergies:   Allergies  Allergen Reactions  . Cefaclor Hives and Swelling  . Hydroxyurea Palpitations and Other (See Comments)    Lowers "blood levels" and heart rate (causes HYPOtension); "it messes me up, it drops my levels and stuff"  Other reaction(s): Hypotension, Other (See Comments) "it messes me up, it drops my levels and stuff" Lower blood levels and HR  Other reaction(s): Other (See Comments) Lowers "blood levels" and heart rate (causes HYPOtension); "it messes me up, it drops my levels and stuff"  . Ketamine Palpitations and Other (See Comments)    "  Pt states she has had previous reaction to ketamine. States she becomes flushed, heart races, dizzy, and feels like she is going to pass out." Other reaction(s): Other (See Comments), Other (See Comments) "Pt states she has had previous reaction to ketamine. States she becomes flushed, heart races, dizzy, and feels like she is going to pass out." "Pt states she has had previous reaction to ketamine. States she becomes flushed, heart races, dizzy, and feels like she is going to pass out."     Physical Exam:   Vitals:   Vitals:   01/07/20 1033 01/07/20 1100  BP: 94/70 (!) 129/89  Pulse: 87 96  Resp: 17 16  Temp:    SpO2: 96% 99%    Physical Exam: Constitutional: Patient appears well-developed and well-nourished. Not in obvious distress. HENT: Normocephalic, atraumatic, External right and left ear normal. Oropharynx is clear and moist.  Eyes: Conjunctivae and EOM are normal. PERRLA, no scleral icterus. Neck: Normal ROM. Neck supple. No JVD. No tracheal deviation. No thyromegaly. CVS: RRR, S1/S2 +, no murmurs, no gallops, no carotid bruit.  Pulmonary: Effort and breath sounds normal, no stridor, rhonchi, wheezes, rales.  Abdominal: Soft. BS +, no distension, tenderness, rebound or  guarding.  Musculoskeletal: Normal range of motion. No edema and no tenderness.  Lymphadenopathy: No lymphadenopathy noted, cervical, inguinal or axillary Neuro: Alert. Normal reflexes, muscle tone coordination. No cranial nerve deficit. Skin: Skin is warm and dry. No rash noted. Not diaphoretic. No erythema. No pallor. Psychiatric: Normal mood and affect. Behavior, judgment, thought content normal.   Data Review:   CBC Recent Labs  Lab 01/07/20 0219 01/07/20 0535  WBC 18.4*  --   HGB 8.2* 9.5*  HCT 24.3* 28.0*  PLT 518*  --   MCV 89.7  --   MCH 30.3  --   MCHC 33.7  --   RDW 16.5*  --   LYMPHSABS 4.8*  --   MONOABS 2.6*  --   EOSABS 0.3  --   BASOSABS 0.1  --    ------------------------------------------------------------------------------------------------------------------  Chemistries  Recent Labs  Lab 01/07/20 0219 01/07/20 0535  NA 137 142  K 3.6 3.5  CL 106 107  CO2 21*  --   GLUCOSE 102* 101*  BUN 13 13  CREATININE 0.54 0.50  CALCIUM 9.1  --   AST 28  --   ALT 19  --   ALKPHOS 73  --   BILITOT 3.4*  --    ------------------------------------------------------------------------------------------------------------------ estimated creatinine clearance is 78.9 mL/min (by C-G formula based on SCr of 0.5 mg/dL). ------------------------------------------------------------------------------------------------------------------ No results for input(s): TSH, T4TOTAL, T3FREE, THYROIDAB in the last 72 hours.  Invalid input(s): FREET3  Coagulation profile No results for input(s): INR, PROTIME in the last 168 hours. ------------------------------------------------------------------------------------------------------------------- No results for input(s): DDIMER in the last 72 hours. -------------------------------------------------------------------------------------------------------------------  Cardiac Enzymes No results for input(s): CKMB, TROPONINI,  MYOGLOBIN in the last 168 hours.  Invalid input(s): CK ------------------------------------------------------------------------------------------------------------------    Component Value Date/Time   BNP 70.6 05/05/2019 0204    ---------------------------------------------------------------------------------------------------------------  Urinalysis    Component Value Date/Time   COLORURINE YELLOW 08/14/2019 1800   APPEARANCEUR CLEAR 08/14/2019 1800   LABSPEC 1.011 08/14/2019 1800   PHURINE 7.0 08/14/2019 1800   GLUCOSEU NEGATIVE 08/14/2019 1800   HGBUR SMALL (A) 08/14/2019 1800   BILIRUBINUR NEGATIVE 08/14/2019 1800   BILIRUBINUR small 07/09/2019 1440   KETONESUR NEGATIVE 08/14/2019 1800   PROTEINUR 30 (A) 08/14/2019 1800   UROBILINOGEN 1.0 07/09/2019 1440   NITRITE  NEGATIVE 08/14/2019 1800   LEUKOCYTESUR NEGATIVE 08/14/2019 1800    ----------------------------------------------------------------------------------------------------------------   Imaging Results:    CT Angio Chest PE W and/or Wo Contrast  Result Date: 01/07/2020 CLINICAL DATA:  Chest pain and shortness of breath. Sickle cell disease. EXAM: CT ANGIOGRAPHY CHEST WITH CONTRAST TECHNIQUE: Multidetector CT imaging of the chest was performed using the standard protocol during bolus administration of intravenous contrast. Multiplanar CT image reconstructions and MIPs were obtained to evaluate the vascular anatomy. CONTRAST:  16mL OMNIPAQUE IOHEXOL 350 MG/ML SOLN COMPARISON:  07/07/2019 FINDINGS: Cardiovascular: The heart is mildly enlarged but stable. No pericardial effusion. The aorta is normal in caliber. No dissection or atherosclerotic calcifications. No definite coronary artery calcifications. The pulmonary arterial tree is well opacified. No filling defects to suggest pulmonary embolism. Bilateral Port-A-Cath are noted. Mediastinum/Nodes: Small scattered mediastinal and hilar lymph nodes are stable. No mass or  overt adenopathy. The esophagus is grossly normal. Lungs/Pleura: Moderate vascular congestion and patchy ground-glass opacity suggesting mild edema or alveolitis. No focal airspace consolidation or pleural effusions. No pneumothorax. No worrisome pulmonary lesions. There is a stable right paraspinal mass at T8-9 most likely an area of extra medullary hematopoiesis. Upper Abdomen: No significant upper abdominal findings. 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 Musculoskeletal: Stable macro calcifications in the right breast. No supraclavicular or axillary adenopathy. The bony structures are intact. Typical findings of sickle cell changes involving the osseous structures. Review of the MIP images confirms the above findings. IMPRESSION: 1. No CT findings for pulmonary embolism. 2. Normal thoracic aorta. 3. Moderate vascular congestion and patchy ground-glass opacity suggesting mild edema or alveolitis. 4. 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 Electronically Signed   By: Marijo Sanes M.D.   On: 01/07/2020 07:51   DG Chest Portable 1 View  Result Date: 01/07/2020 CLINICAL DATA:  Chest pain. Shortness of breath. Sickle cell disease. EXAM: PORTABLE CHEST 1 VIEW COMPARISON:  Chest x-ray 11/12/2019 FINDINGS: Bilateral Port-A-Cath is in good position without complicating features. Low lung volumes with vascular crowding and streaky basilar atelectasis. No infiltrates or effusions. Borderline cardiac enlargement extension weighted by the AP portable technique and by the low lung volumes. The bony structures are intact. IMPRESSION: Low lung volumes with vascular crowding and streaky basilar atelectasis. Electronically Signed   By: Marijo Sanes M.D.   On: 01/07/2020 06:35     Assessment & Plan:  Principal Problem:   Sickle cell anemia with crisis (HCC) Active Problems:   Leukocytosis   Anxiety and depression   Gastro-esophageal reflux disease without  esophagitis  1. Hb Sickle Cell Disease with crisis: Admit to MedSurg, start IVF D5 .45% Saline @ 125 mls/hour, start weight based Dilaudid PCA, start IV Toradol 15 mg Q 6 H, restart and continue home oral pain medications, monitor vitals very closely, Re-evaluate pain scale regularly, 2 L of Oxygen by [place], Patient will be re-evaluated for pain in the context of function and relationship to baseline as care progresses. 2. Leukocytosis: This is chronic and most likely reactive, there is no significant evidence for inflammation or infection.  We will monitor very closely without antibiotics. 3. Sickle Cell Anemia: Hemoglobin is stable at baseline today, will continue to monitor, no clinical indication for blood transfusion today.  Will transfuse patient if there is significant drop in hemoglobin below 6.5. 4. Chronic pain Syndrome: Restart and continue home pain medications. 5. History of pulmonary embolism: Continue Xarelto 6. Intermittent asthma: CT chest was ordered  because of complaint of chest pain, which showed no CT findings of pulmonary embolism, but moderate vascular congestions and patchy groundglass opacities suggesting mild edema or alveolitis.  I think this is related to her asthma, will restart and continue her inhalers including albuterol and steroid inhaler - Dulera.  Patient is clinically stable with no shortness of breath or desaturation.  DVT Prophylaxis: Patient is on Xarelto, continue  AM Labs Ordered, also please review Full Orders  Family Communication: Admission, patient's condition and plan of care including tests being ordered have been discussed with the patient who indicate understanding and agree with the plan and Code Status.  Code Status: Full Code  Consults called: None    Admission status: Inpatient    Time spent in minutes : 50 minutes  Angelica Chessman MD, MHA, CPE, [place] 01/07/2020 at 11:20 AM

## 2020-01-07 NOTE — ED Triage Notes (Signed)
Pt sts sternal chest pains and tightness in chest and upper back. Pt sts this is  Her typical sickle cell pain.

## 2020-01-07 NOTE — ED Provider Notes (Signed)
[facility] DEPT Provider Note   CSN: [id number] Arrival date & time: 01/07/20  0200     History Chief Complaint  Patient presents with  . Sickle Cell Pain Crisis    Molly Gutierrez is a 39 y.o. female.  The history is provided by the patient.  Sickle Cell Pain Crisis Location:  Chest Severity:  Severe Onset quality:  Gradual Duration:  2 days Similar to previous crisis episodes: yes   Timing:  Constant Progression:  Unchanged Chronicity:  Recurrent Sickle cell genotype:  SS Frequency of attacks:  Frequent  History of pulmonary emboli: yes (on xarelto )   Relieved by:  Nothing Worsened by:  Nothing Ineffective treatments:  None tried Associated symptoms: no congestion, no fatigue, no fever, no headaches, no leg ulcers, no shortness of breath, no sore throat, no swelling of legs, no vision change and no vomiting   Risk factors: frequent pain crises        Past Medical History:  Diagnosis Date  . Asthma   . Eczema   . History of pulmonary embolus (PE)   . Sickle cell anemia [facility])     Patient Active Problem List   Diagnosis Date Noted  . Sickle cell crisis acute chest syndrome ([place]) 07/29/2019  . Drug-seeking behavior 07/07/2019  . Sinus tachycardia 07/07/2019  . Therapeutic opioid-induced constipation (OIC) 07/07/2019  . Breast mass in female 06/29/2019  . Atelectasis of right lung 06/29/2019  . Sickle cell crisis ([place]) 06/27/2019  . Sickle cell pain crisis ([place]) 04/19/2019  . Pneumonia 03/27/2019  . 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 Anterior chest wall pain 11/13/2018  . Epigastric abdominal pain 11/13/2018  . Hematuria 11/13/2018  . Hyperbilirubinemia 11/12/2018  . Long term current use of anticoagulant therapy 11/12/2018  . History of pulmonary embolism 09/26/2018  . Acute pulmonary embolism ([place]) 09/26/2018  . Opioid dependence ([place]) 09/13/2018  . Port-A-Cath in place 09/13/2018  . Narcotic abuse, continuous  ([place]) 08/28/2018  . Moderate persistent asthma without complication 81/06/7508  . Anemia 07/03/2018  . Personal history of other venous thrombosis and embolism 07/03/2018  . History of transfusion 07/03/2018  . Gastro-esophageal reflux disease without esophagitis 06/02/2018  . Asthma 05/19/2018  . Anxiety and depression 10/30/2017  . At risk for sepsis 06/13/2017  . Mitral regurgitation 02/01/2014  . Itching 12/13/2013  . Nausea & vomiting 12/13/2013  . 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 Frequent complaints of pain 11/01/2013  . Hypokalemia 09/19/2013  . S/P total hip arthroplasty 05/19/2013  . 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 Lower urinary tract infectious disease 03/02/2013  . Chest pain 11/14/2012  . Sickle-cell anemia ([place]) 10/30/2012  . Pain management 08/01/2012  . Reticulocytosis 07/31/2012  . 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 Leukocytosis 03/27/2012  . History of gestational diabetes 10/07/2011  . Hb-SS disease with crisis, unspecified ([place]) 06/25/2010    Past Surgical History:  Procedure Laterality Date  . CHOLECYSTECTOMY    . ERCP    . JOINT REPLACEMENT    . PORTA CATH INSERTION    . TUBAL LIGATION    . WISDOM TOOTH EXTRACTION       OB History   No obstetric history on file.     Family History  Problem Relation Age of Onset  . Renal Disease Mother   . Hypertension Mother   . High Cholesterol Mother  Social History   Tobacco Use  . Smoking status: Never Smoker  . Smokeless tobacco: Never Used  Vaping Use  . Vaping Use: Never used  Substance Use Topics  . Alcohol use: Never  . Drug use: Never    Home Medications Prior to Admission medications   Medication Sig Start Date End Date Taking? Authorizing Provider  albuterol (VENTOLIN HFA) 108 (90 Base) MCG/ACT inhaler  Inhale 2 puffs into the lungs 2 (two) times daily as needed for wheezing or shortness of breath. 12/31/16  Yes [provider]  celecoxib (CELEBREX) 200 MG capsule Take 200 mg by mouth daily.  10/14/19  Yes [provider]  Cholecalciferol (VITAMIN D3) 25 MCG (1000 UT) CAPS Take 1,000 Units by mouth daily.   Yes [provider]  Deferasirox (JADENU) 360 MG TABS Take 1,080 mg by mouth daily before breakfast. 08/08/19 08/07/20 Yes [provider]  diphenhydrAMINE (BENADRYL) 25 mg capsule Take 25 mg by mouth 3 (three) times daily as needed for itching.   Yes [provider]  folic acid (FOLVITE) 1 MG tablet Take 1 mg by mouth daily. 12/31/16  Yes [provider]  gabapentin (NEURONTIN) 300 MG capsule Take 300 mg by mouth 3 (three) times daily. 10/13/19  Yes [provider]  HYDROmorphone (DILAUDID) 4 MG tablet Take 4 mg by mouth every 6 (six) hours as needed for moderate pain or severe pain.  08/20/19  Yes [provider]  mirtazapine (REMERON) 15 MG tablet Take 45 mg by mouth at bedtime.  08/08/19  Yes [provider]  mometasone-formoterol (DULERA) 100-5 MCG/ACT AERO Inhale 2 puffs into the lungs daily as needed for wheezing or shortness of breath.   Yes [provider]  omeprazole (PRILOSEC) 20 MG capsule Take 20 mg by mouth 2 (two) times a day. 09/08/18  Yes [provider]  promethazine (PHENERGAN) 12.5 MG tablet Take 12.5 mg by mouth every 6 (six) hours as needed for nausea or vomiting.  10/13/19  Yes [provider]  vitamin B-12 (CYANOCOBALAMIN) 1000 MCG tablet Take 1,000 mcg by mouth daily.   Yes [provider]  XARELTO 20 MG TABS tablet Take 1 tablet (20 mg total) by mouth daily. Patient taking differently: Take 20 mg by mouth daily with supper.  05/14/19  Yes [name], MD  ondansetron (ZOFRAN) 4 MG tablet Take 1 tablet (4 mg total) by mouth every 8 (eight) hours as needed for  nausea or vomiting. Patient not taking: Reported on 11/12/2019 09/15/19   Rolland Porter, MD  oxyCODONE (OXYCONTIN) 10 mg 12 hr tablet Take 1 tablet (10 mg total) by mouth every 12 (twelve) hours. Patient not taking: Reported on 08/08/2019 08/04/19   [name], FNP  Oxycodone HCl 10 MG TABS Take 1 tablet (10 mg total) by mouth every 4 (four) hours as needed (pain). Every 4 hours while awake Patient not taking: Reported on 09/15/2019 08/04/19   [name], FNP  promethazine (PHENERGAN) 25 MG suppository Place 1 suppository (25 mg total) rectally every 6 (six) hours as needed for nausea or vomiting. Patient not taking: Reported on 11/12/2019 08/14/19   Tedd Sias, PA    Allergies    Cefaclor, Hydroxyurea, and Ketamine  Review of Systems   Review of Systems  Constitutional: Negative for fatigue and fever.  HENT: Negative for congestion and sore throat.   Eyes: Negative for visual disturbance.  Respiratory: Negative for shortness of breath.   Cardiovascular: Negative for palpitations  and leg swelling.  Gastrointestinal: Negative for abdominal pain and vomiting.  Genitourinary: Negative for difficulty urinating.  Musculoskeletal: Negative for arthralgias.  Skin: Negative for wound.  Neurological: Negative for headaches.  Psychiatric/Behavioral: Negative for agitation.  All other systems reviewed and are negative.   Physical Exam Updated Vital Signs BP (!) 121/91 (BP Location: Left Arm)   Pulse 83   Temp 98.5 F (36.9 C) (Oral)   Resp 18   Ht 5\' 3"  (1.6 m)   Wt 57.6 kg   LMP 12/29/2019   SpO2 97%   BMI 22.50 kg/m   Physical Exam Vitals and nursing note reviewed.  Constitutional:      General: She is not in acute distress.    Appearance: Normal appearance.  HENT:     Head: Normocephalic and atraumatic.     Nose: Nose normal.  Eyes:     Conjunctiva/sclera: Conjunctivae normal.     Pupils: Pupils are equal, round, and reactive to light.  Cardiovascular:     Rate  and Rhythm: Normal rate and regular rhythm.     Pulses: Normal pulses.     Heart sounds: Normal heart sounds.  Pulmonary:     Effort: Pulmonary effort is normal.     Breath sounds: Normal breath sounds.  Abdominal:     General: Abdomen is flat. Bowel sounds are normal.     Tenderness: There is no abdominal tenderness. There is no guarding or rebound.  Musculoskeletal:        General: Normal range of motion.     Cervical back: Normal range of motion and neck supple.  Skin:    General: Skin is warm and dry.     Capillary Refill: Capillary refill takes less than 2 seconds.  Neurological:     General: No focal deficit present.     Mental Status: She is alert and oriented to person, place, and time.     Deep Tendon Reflexes: Reflexes normal.  Psychiatric:        Mood and Affect: Mood normal.        Behavior: Behavior normal.     ED Results / Procedures / Treatments   Labs (all labs ordered are listed, but only abnormal results are displayed) Results for orders placed or performed during the hospital encounter of 01/07/20  Comprehensive metabolic panel  Result Value Ref Range   Sodium 137 135 - 145 mmol/L   Potassium 3.6 3.5 - 5.1 mmol/L   Chloride 106 98 - 111 mmol/L   CO2 21 (L) 22 - 32 mmol/L   Glucose, Bld 102 (H) 70 - 99 mg/dL   BUN 13 6 - 20 mg/dL   Creatinine, Ser 0.54 0.44 - 1.00 mg/dL   Calcium 9.1 8.9 - 10.3 mg/dL   Total Protein 8.4 (H) 6.5 - 8.1 g/dL   Albumin 4.6 3.5 - 5.0 g/dL   AST 28 15 - 41 U/L   ALT 19 0 - 44 U/L   Alkaline Phosphatase 73 38 - 126 U/L   Total Bilirubin 3.4 (H) 0.3 - 1.2 mg/dL   GFR calc non Af Amer >60 >60 mL/min   GFR calc Af Amer >60 >60 mL/min   Anion gap 10 5 - 15  CBC with Differential  Result Value Ref Range   WBC 18.4 (H) 4.0 - 10.5 K/uL   RBC 2.71 (L) 3.87 - 5.11 MIL/uL   Hemoglobin 8.2 (L) 12.0 - 15.0 g/dL   HCT 24.3 (L) 36 - 46 %  MCV 89.7 80.0 - 100.0 fL   MCH 30.3 26.0 - 34.0 pg   MCHC 33.7 30.0 - 36.0 g/dL   RDW 16.5  (H) 11.5 - 15.5 %   Platelets 518 (H) 150 - 400 K/uL   nRBC 0.1 0.0 - 0.2 %   Neutrophils Relative % 57 %   Neutro Abs 10.5 (H) 1.7 - 7.7 K/uL   Lymphocytes Relative 26 %   Lymphs Abs 4.8 (H) 0.7 - 4.0 K/uL   Monocytes Relative 14 %   Monocytes Absolute 2.6 (H) 0 - 1 K/uL   Eosinophils Relative 1 %   Eosinophils Absolute 0.3 0 - 0 K/uL   Basophils Relative 1 %   Basophils Absolute 0.1 0 - 0 K/uL   Immature Granulocytes 1 %   Abs Immature Granulocytes 0.10 (H) 0.00 - 0.07 K/uL  Reticulocytes  Result Value Ref Range   Retic Ct Pct 8.9 (H) 0.4 - 3.1 %   RBC. 2.72 (L) 3.87 - 5.11 MIL/uL   Retic Count, Absolute 240.7 (H) 19.0 - 186.0 K/uL   Immature Retic Fract 11.3 2.3 - 15.9 %  I-Stat beta hCG blood, ED  Result Value Ref Range   I-stat hCG, quantitative <5.0 <5 mIU/mL   Comment 3          I-stat chem 8, ED (not at [facility] or [facility])  Result Value Ref Range   Sodium 142 135 - 145 mmol/L   Potassium 3.5 3.5 - 5.1 mmol/L   Chloride 107 98 - 111 mmol/L   BUN 13 6 - 20 mg/dL   Creatinine, Ser 0.50 0.44 - 1.00 mg/dL   Glucose, Bld 101 (H) 70 - 99 mg/dL   Calcium, Ion 1.22 1.15 - 1.40 mmol/L   TCO2 22 22 - 32 mmol/L   Hemoglobin 9.5 (L) 12.0 - 15.0 g/dL   HCT 28.0 (L) 36 - 46 %   No results found.  EKG EKG Interpretation  Date/Time:  Friday January 07 2020 02:24:03 EDT Ventricular Rate:  96 PR Interval:    QRS Duration: 81 QT Interval:  383 QTC Calculation: 484 R Axis:   62 Text Interpretation: Sinus rhythm Multiple ventricular premature complexes Confirmed by Randal Buba, Nathalya Wolanski ([id number]) on 01/07/2020 5:04:25 AM   Radiology No results found.  Procedures Procedures (including critical care time)  Medications Ordered in ED Medications  diphenhydrAMINE (BENADRYL) capsule 25-50 mg (25 mg Oral Given 01/07/20 0552)  ondansetron (ZOFRAN) injection 4 mg (4 mg Intravenous Given 01/07/20 0540)  HYDROmorphone (DILAUDID) injection 2 mg (has no administration in time range)  dextrose 5 %-0.45  % sodium chloride infusion ( Intravenous New Bag/Given 01/07/20 0543)  oxyCODONE (Oxy IR/ROXICODONE) immediate release tablet 15 mg (has no administration in time range)  ketorolac (TORADOL) 15 MG/ML injection 15 mg (15 mg Intravenous Given 01/07/20 0541)  HYDROmorphone (DILAUDID) injection 2 mg (2 mg Intravenous Given 01/07/20 0541)    ED Course  I have reviewed the triage vital signs and the nursing notes.  Pertinent labs & imaging results that were available during my care of the patient were reviewed by me and considered in my medical decision making (see chart for details).    CT scan pending   Signed out to Dr. Stark Jock  Final Clinical Impression(s) / ED Diagnoses    Molly Klimas, MD 01/07/20 0730

## 2020-01-07 NOTE — ED Notes (Signed)
Patient request that her blood work be pulled from her port

## 2020-01-08 DIAGNOSIS — D5700 Hb-SS disease with crisis, unspecified: Secondary | ICD-10-CM | POA: Diagnosis not present

## 2020-01-08 DIAGNOSIS — D72829 Elevated white blood cell count, unspecified: Secondary | ICD-10-CM | POA: Diagnosis not present

## 2020-01-08 DIAGNOSIS — F419 Anxiety disorder, unspecified: Secondary | ICD-10-CM | POA: Diagnosis not present

## 2020-01-08 DIAGNOSIS — K219 Gastro-esophageal reflux disease without esophagitis: Secondary | ICD-10-CM | POA: Diagnosis not present

## 2020-01-08 LAB — CBC WITH DIFFERENTIAL/PLATELET
Abs Immature Granulocytes: 0.07 10*3/uL (ref 0.00–0.07)
Basophils Absolute: 0.1 10*3/uL (ref 0.0–0.1)
Basophils Relative: 1 %
Eosinophils Absolute: 0.5 10*3/uL (ref 0.0–0.5)
Eosinophils Relative: 3 %
HCT: 20 % — ABNORMAL LOW (ref 36.0–46.0)
Hemoglobin: 6.7 g/dL — CL (ref 12.0–15.0)
Immature Granulocytes: 1 %
Lymphocytes Relative: 30 %
Lymphs Abs: 4.6 10*3/uL — ABNORMAL HIGH (ref 0.7–4.0)
MCH: 30.6 pg (ref 26.0–34.0)
MCHC: 33.5 g/dL (ref 30.0–36.0)
MCV: 91.3 fL (ref 80.0–100.0)
Monocytes Absolute: 2.3 10*3/uL — ABNORMAL HIGH (ref 0.1–1.0)
Monocytes Relative: 15 %
Neutro Abs: 7.9 10*3/uL — ABNORMAL HIGH (ref 1.7–7.7)
Neutrophils Relative %: 50 %
Platelets: 484 10*3/uL — ABNORMAL HIGH (ref 150–400)
RBC: 2.19 MIL/uL — ABNORMAL LOW (ref 3.87–5.11)
RDW: 17.6 % — ABNORMAL HIGH (ref 11.5–15.5)
WBC: 15.4 10*3/uL — ABNORMAL HIGH (ref 4.0–10.5)
nRBC: 0.1 % (ref 0.0–0.2)

## 2020-01-08 LAB — PREPARE RBC (CROSSMATCH)

## 2020-01-08 LAB — COMPREHENSIVE METABOLIC PANEL
ALT: 15 U/L (ref 0–44)
AST: 24 U/L (ref 15–41)
Albumin: 3.6 g/dL (ref 3.5–5.0)
Alkaline Phosphatase: 59 U/L (ref 38–126)
Anion gap: 7 (ref 5–15)
BUN: 12 mg/dL (ref 6–20)
CO2: 23 mmol/L (ref 22–32)
Calcium: 8.3 mg/dL — ABNORMAL LOW (ref 8.9–10.3)
Chloride: 107 mmol/L (ref 98–111)
Creatinine, Ser: 0.54 mg/dL (ref 0.44–1.00)
GFR calc Af Amer: >60 mL/min (ref >60)
GFR calc non Af Amer: >60 mL/min (ref >60)
Glucose, Bld: 97 mg/dL (ref 70–99)
Potassium: 3.5 mmol/L (ref 3.5–5.1)
Sodium: 137 mmol/L (ref 135–145)
Total Bilirubin: 2.8 mg/dL — ABNORMAL HIGH (ref 0.3–1.2)
Total Protein: 6.8 g/dL (ref 6.5–8.1)

## 2020-01-08 MED ORDER — CHLORHEXIDINE GLUCONATE CLOTH 2 % EX PADS
6.0000 | MEDICATED_PAD | Freq: Every day | CUTANEOUS | Status: DC
  Administered 2020-01-08 – 2020-01-10 (×3): 6 via TOPICAL

## 2020-01-08 MED ORDER — SODIUM CHLORIDE 0.9% IV SOLUTION: Freq: Once | INTRAVENOUS | Status: AC

## 2020-01-08 NOTE — Progress Notes (Signed)
Patient ID: Molly Gutierrez, female   DOB: 03-Dec-1980, 39 y.o.   MRN: [id number] Subjective: Molly Gutierrez, a 39 year old female with a medical history significant for sickle cell disease, chronic pain syndrome, opiate dependence and tolerance, history of PE on Xarelto, history of mild intermittent asthma, and history of anemia of chronic disease was admitted for sickle cell pain crisis.  Patient claims her pain is unchanged from yesterday, although she rates her pain at 8/10 today as compared to 10/10 yesterday. Her sternal chest pain has reduced some, no cough or shortness of breath.  Her hemoglobin has dropped to 6.7 today from above 8 yesterday. Patient also claimed she feels dizzy when she stands up.  She has no fever, no urinary symptoms, no nausea vomiting or diarrhea.  Objective:  Vital signs in last 24 hours:  Vitals:   01/08/20 0328 01/08/20 0457 01/08/20 1002 01/08/20 1312  BP:  107/71 99/69 (!) 106/61  Pulse:  95 96 102  Resp: 14 16 16 16   Temp:  98.9 F (37.2 C) 99.4 F (37.4 C) 99 F (37.2 C)  TempSrc:  Oral Oral Oral  SpO2: 95% 95% 96% 93%  Weight:      Height:       Intake/Output from previous day:  No intake or output data in the 24 hours ending 01/08/20 1410  Physical Exam: General: Alert, awake, oriented x3, in no acute distress.  HEENT: [place]/AT PEERL, EOMI Neck: Trachea midline,  no masses, no thyromegal,y no JVD, no carotid bruit OROPHARYNX:  Moist, No exudate/ erythema/lesions.  Heart: Regular rate and rhythm, without murmurs, rubs, gallops, PMI non-displaced, no heaves or thrills on palpation.  Lungs: Clear to auscultation, no wheezing or rhonchi noted. No increased vocal fremitus resonant to percussion  Abdomen: Soft, nontender, nondistended, positive bowel sounds, no masses no hepatosplenomegaly noted..  Neuro: No focal neurological deficits noted cranial nerves II through XII grossly intact. DTRs 2+ bilaterally upper and lower extremities.  Strength 5 out of 5 in bilateral upper and lower extremities. Musculoskeletal: No warm swelling or erythema around joints, no spinal tenderness noted. Psychiatric: Patient alert and oriented x3, good insight and cognition, good recent to remote recall. Lymph node survey: No cervical axillary or inguinal lymphadenopathy noted.  Lab Results:  Basic Metabolic Panel:    Component Value Date/Time   NA 137 01/08/2020 0542   K 3.5 01/08/2020 0542   CL 107 01/08/2020 0542   CO2 23 01/08/2020 0542   BUN 12 01/08/2020 0542   CREATININE 0.54 01/08/2020 0542   GLUCOSE 97 01/08/2020 0542   CALCIUM 8.3 (L) 01/08/2020 0542   CBC:    Component Value Date/Time   WBC 15.4 (H) 01/08/2020 0542   HGB 6.7 (LL) 01/08/2020 0542   HCT 20.0 (L) 01/08/2020 0542   PLT 484 (H) 01/08/2020 0542   MCV 91.3 01/08/2020 0542   NEUTROABS 7.9 (H) 01/08/2020 0542   LYMPHSABS 4.6 (H) 01/08/2020 0542   MONOABS 2.3 (H) 01/08/2020 0542   EOSABS 0.5 01/08/2020 0542   BASOSABS 0.1 01/08/2020 0542    Recent Results (from the past 240 hour(s))  SARS Coronavirus 2 by RT PCR (hospital order, performed in [place] hospital lab) Nasopharyngeal Nasopharyngeal Swab     Status: None   Collection Time: 01/07/20  9:28 AM   Specimen: Nasopharyngeal Swab  Result Value Ref Range Status   SARS Coronavirus 2 NEGATIVE NEGATIVE Final    Comment: (NOTE) SARS-CoV-2 target nucleic acids are NOT DETECTED.  The SARS-CoV-2 RNA is generally  detectable in upper and lower respiratory specimens during the acute phase of infection. The lowest concentration of SARS-CoV-2 viral copies this assay can detect is 250 copies / mL. A negative result does not preclude SARS-CoV-2 infection and should not be used as the sole basis for treatment or other patient management decisions.  A negative result may occur with improper specimen collection / handling, submission of specimen other than nasopharyngeal swab, presence of viral mutation(s) within  the areas targeted by this assay, and inadequate number of viral copies (<250 copies / mL). A negative result must be combined with clinical observations, patient history, and epidemiological information.  Fact Sheet for Patients:   StrictlyIdeas.no  Fact Sheet for Healthcare Providers: BankingDealers.co.za  This test is not yet approved or  cleared by the [place] FDA and has been authorized for detection and/or diagnosis of SARS-CoV-2 by FDA under an Emergency Use Authorization (EUA).  This EUA will remain in effect (meaning this test can be used) for the duration of the COVID-19 declaration under Section 564(b)(1) of the Act, 21 U.S.C. section 360bbb-3(b)(1), unless the authorization is terminated or revoked sooner.  Performed at [facility], [place] [address], [place], [place] [address]     Studies/Results: CT Angio Chest PE W and/or Wo Contrast  Result Date: 01/07/2020 CLINICAL DATA:  Chest pain and shortness of breath. Sickle cell disease. EXAM: CT ANGIOGRAPHY CHEST WITH CONTRAST TECHNIQUE: Multidetector CT imaging of the chest was performed using the standard protocol during bolus administration of intravenous contrast. Multiplanar CT image reconstructions and MIPs were obtained to evaluate the vascular anatomy. CONTRAST:  32mL OMNIPAQUE IOHEXOL 350 MG/ML SOLN COMPARISON:  07/07/2019 FINDINGS: Cardiovascular: The heart is mildly enlarged but stable. No pericardial effusion. The aorta is normal in caliber. No dissection or atherosclerotic calcifications. No definite coronary artery calcifications. The pulmonary arterial tree is well opacified. No filling defects to suggest pulmonary embolism. Bilateral Port-A-Cath are noted. Mediastinum/Nodes: Small scattered mediastinal and hilar lymph nodes are stable. No mass or overt adenopathy. The esophagus is grossly normal. Lungs/Pleura: Moderate vascular congestion and  patchy ground-glass opacity suggesting mild edema or alveolitis. No focal airspace consolidation or pleural effusions. No pneumothorax. No worrisome pulmonary lesions. There is a stable right paraspinal mass at T8-9 most likely an area of extra medullary hematopoiesis. Upper Abdomen: No significant upper abdominal findings. 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 Musculoskeletal: Stable macro calcifications in the right breast. No supraclavicular or axillary adenopathy. The bony structures are intact. Typical findings of sickle cell changes involving the osseous structures. Review of the MIP images confirms the above findings. IMPRESSION: 1. No CT findings for pulmonary embolism. 2. Normal thoracic aorta. 3. Moderate vascular congestion and patchy ground-glass opacity suggesting mild edema or alveolitis. 4. 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 Electronically Signed   By: Marijo Sanes M.D.   On: 01/07/2020 07:51   DG Chest Portable 1 View  Result Date: 01/07/2020 CLINICAL DATA:  Chest pain. Shortness of breath. Sickle cell disease. EXAM: PORTABLE CHEST 1 VIEW COMPARISON:  Chest x-ray 11/12/2019 FINDINGS: Bilateral Port-A-Cath is in good position without complicating features. Low lung volumes with vascular crowding and streaky basilar atelectasis. No infiltrates or effusions. Borderline cardiac enlargement extension weighted by the AP portable technique and by the low lung volumes. The bony structures are intact. IMPRESSION: Low lung volumes with vascular crowding and streaky basilar atelectasis. Electronically Signed   By: Marijo Sanes M.D.   On:  01/07/2020 06:35    Medications: Scheduled Meds: . sodium chloride   Intravenous Once  . celecoxib  200 mg Oral Daily  . Chlorhexidine Gluconate Cloth  6 each Topical Daily  . cholecalciferol  1,000 Units Oral Daily  . Deferasirox  1,080 mg Oral QAC breakfast  . folic acid  1 mg Oral Daily  .  gabapentin  300 mg Oral TID  . HYDROmorphone   Intravenous Q4H  . ketorolac  15 mg Intravenous Q6H  . mirtazapine  45 mg Oral QHS  . oxyCODONE  10 mg Oral Q12H  . pantoprazole  40 mg Oral Daily  . rivaroxaban  20 mg Oral QAC supper  . senna-docusate  1 tablet Oral BID  . vitamin B-12  1,000 mcg Oral Daily   Continuous Infusions: . sodium chloride 50 mL/hr at 01/08/20 1237   PRN Meds:.albuterol, diphenhydrAMINE **OR** [DISCONTINUED] diphenhydrAMINE, mometasone-formoterol, naloxone **AND** sodium chloride flush, ondansetron (ZOFRAN) IV, polyethylene glycol  Consultants:  None  Procedures:  None  Antibiotics:  None  Assessment/Plan: Principal Problem:   Sickle cell anemia with crisis ([place]) Active Problems:   Leukocytosis   Anxiety and depression   Gastro-esophageal reflux disease without esophagitis  1. Hb Sickle Cell Disease with crisis: Reduce IVF D5 .45% Saline to 50 mls/hour, continue weight based Dilaudid PCA, continue IV Toradol 15 mg Q 6 H for total of 5 days, continue oral pain medications, monitor vitals very closely, Re-evaluate pain scale regularly, 2 L of Oxygen by [place]. 2. Leukocytosis: Improving, most likely reactive, will continue to monitor without antibiotics.  3. Sickle Cell Anemia: Hemoglobin has dropped to 6.7, patient is currently symptomatic.  Will transfuse with 1 unit of packed red blood cells today, recheck CBC tomorrow morning. 4. Chronic pain Syndrome: Continue home pain medications. 5. History of PE: Continue Xarelto. 6. Intermittent asthma: Asymptomatic today. Continue inhalers.  Code Status: Full Code Family Communication: N/A Disposition Plan: Not yet ready for discharge  Siedah Sedor  If 7PM-7AM, please contact night-coverage.  01/08/2020, 2:10 PM  LOS: 1 day

## 2020-01-08 NOTE — Progress Notes (Signed)
Pharmacy IV to PO conversion  The patient is ordered Diphenhydramine by the intravenous route with a linked PO option available.  Based on criteria approved by the Pharmacy and [place], the IV option is being discontinued.   Not prescribed to treat or prevent a severe allergic reaction   Not prescribed as premedication prior to receiving blood product, biologic medication, antimicrobial, or chemotherapy agent   The patient has tolerated at least one dose of an oral or enteral medication   The patient has no evidence of active gastrointestinal bleeding or impaired GI absorption (gastrectomy, short bowel, patient on TNA or NPO).   The patient is not undergoing procedural sedation  If you have any questions about this conversion, please contact the Pharmacy Department (ext [phone]).  Thank you.  Reuel Boom, PharmD, BCPS [phone] 01/08/2020, 11:07 AM

## 2020-01-09 DIAGNOSIS — F419 Anxiety disorder, unspecified: Secondary | ICD-10-CM | POA: Diagnosis not present

## 2020-01-09 DIAGNOSIS — D72829 Elevated white blood cell count, unspecified: Secondary | ICD-10-CM | POA: Diagnosis not present

## 2020-01-09 DIAGNOSIS — K219 Gastro-esophageal reflux disease without esophagitis: Secondary | ICD-10-CM | POA: Diagnosis not present

## 2020-01-09 DIAGNOSIS — D5700 Hb-SS disease with crisis, unspecified: Secondary | ICD-10-CM | POA: Diagnosis not present

## 2020-01-09 LAB — CBC WITH DIFFERENTIAL/PLATELET
Abs Immature Granulocytes: 0.24 10*3/uL — ABNORMAL HIGH (ref 0.00–0.07)
Basophils Absolute: 0.1 10*3/uL (ref 0.0–0.1)
Basophils Relative: 1 %
Eosinophils Absolute: 1 10*3/uL — ABNORMAL HIGH (ref 0.0–0.5)
Eosinophils Relative: 5 %
HCT: 23 % — ABNORMAL LOW (ref 36.0–46.0)
Hemoglobin: 7.6 g/dL — ABNORMAL LOW (ref 12.0–15.0)
Immature Granulocytes: 1 %
Lymphocytes Relative: 22 %
Lymphs Abs: 4.7 10*3/uL — ABNORMAL HIGH (ref 0.7–4.0)
MCH: 30 pg (ref 26.0–34.0)
MCHC: 33 g/dL (ref 30.0–36.0)
MCV: 90.9 fL (ref 80.0–100.0)
Monocytes Absolute: 2.8 10*3/uL — ABNORMAL HIGH (ref 0.1–1.0)
Monocytes Relative: 13 %
Neutro Abs: 12.6 10*3/uL — ABNORMAL HIGH (ref 1.7–7.7)
Neutrophils Relative %: 58 %
Platelets: 500 10*3/uL — ABNORMAL HIGH (ref 150–400)
RBC: 2.53 MIL/uL — ABNORMAL LOW (ref 3.87–5.11)
RDW: 18.3 % — ABNORMAL HIGH (ref 11.5–15.5)
WBC: 21.5 10*3/uL — ABNORMAL HIGH (ref 4.0–10.5)
nRBC: 0.2 % (ref 0.0–0.2)

## 2020-01-09 LAB — TYPE AND SCREEN
ABO/RH(D): A POS
Antibody Screen: NEGATIVE
Unit division: 0

## 2020-01-09 LAB — BPAM RBC
Blood Product Expiration Date: 202108212359
ISSUE DATE / TIME: 202107241723
Unit Type and Rh: 5100

## 2020-01-09 NOTE — Progress Notes (Signed)
Patient ID: Molly Gutierrez, female   DOB: 1980-08-01, 39 y.o.   MRN: [id number] Subjective: Molly Gutierrez, a 39 year old female with a medical history significant for sickle cell disease, chronic pain syndrome, opiate dependence and tolerance, history of PE on Xarelto, history of mild intermittent asthma, and history of anemia of chronic disease was admitted for sickle cell pain crisis.  Patient claims her pain is a little improved today, still at about 7/10.  She is status post transfusion of 1 unit of packed red blood cells for reduced hemoglobin, patient claims she is much better when it comes to her dizziness. Patient also denies any fever, urinary symptoms, nausea, vomiting or diarrhea.  Objective:  Vital signs in last 24 hours:  Vitals:   01/09/20 0430 01/09/20 0524 01/09/20 0820 01/09/20 1021  BP:  110/73  108/67  Pulse:  97  94  Resp: 13 19 (!) 8 13  Temp:  99.1 F (37.3 C)  99.2 F (37.3 C)  TempSrc:  Oral  Oral  SpO2: 93% 93% 95% 96%  Weight:      Height:       Intake/Output from previous day:   Intake/Output Summary (Last 24 hours) at 01/09/2020 1302 Last data filed at 01/09/2020 1043 Gross per 24 hour  Intake 2658.92 ml  Output --  Net 2658.92 ml    Physical Exam: General: Alert, awake, oriented x3, in no acute distress.  HEENT: [place]/AT PEERL, EOMI Neck: Trachea midline,  no masses, no thyromegal,y no JVD, no carotid bruit OROPHARYNX:  Moist, No exudate/ erythema/lesions.  Heart: Regular rate and rhythm, without murmurs, rubs, gallops, PMI non-displaced, no heaves or thrills on palpation.  Lungs: Clear to auscultation, no wheezing or rhonchi noted. No increased vocal fremitus resonant to percussion  Abdomen: Soft, nontender, nondistended, positive bowel sounds, no masses no hepatosplenomegaly noted..  Neuro: No focal neurological deficits noted cranial nerves II through XII grossly intact. DTRs 2+ bilaterally upper and lower extremities. Strength 5 out of  5 in bilateral upper and lower extremities. Musculoskeletal: No warm swelling or erythema around joints, no spinal tenderness noted. Psychiatric: Patient alert and oriented x3, good insight and cognition, good recent to remote recall. Lymph node survey: No cervical axillary or inguinal lymphadenopathy noted.  Lab Results:  Basic Metabolic Panel:    Component Value Date/Time   NA 137 01/08/2020 0542   K 3.5 01/08/2020 0542   CL 107 01/08/2020 0542   CO2 23 01/08/2020 0542   BUN 12 01/08/2020 0542   CREATININE 0.54 01/08/2020 0542   GLUCOSE 97 01/08/2020 0542   CALCIUM 8.3 (L) 01/08/2020 0542   CBC:    Component Value Date/Time   WBC 21.5 (H) 01/09/2020 0611   HGB 7.6 (L) 01/09/2020 0611   HCT 23.0 (L) 01/09/2020 0611   PLT 500 (H) 01/09/2020 0611   MCV 90.9 01/09/2020 0611   NEUTROABS 12.6 (H) 01/09/2020 0611   LYMPHSABS 4.7 (H) 01/09/2020 0611   MONOABS 2.8 (H) 01/09/2020 0611   EOSABS 1.0 (H) 01/09/2020 0611   BASOSABS 0.1 01/09/2020 1448    Recent Results (from the past 240 hour(s))  SARS Coronavirus 2 by RT PCR (hospital order, performed in [facility] hospital lab) Nasopharyngeal Nasopharyngeal Swab     Status: None   Collection Time: 01/07/20  9:28 AM   Specimen: Nasopharyngeal Swab  Result Value Ref Range Status   SARS Coronavirus 2 NEGATIVE NEGATIVE Final    Comment: (NOTE) SARS-CoV-2 target nucleic acids are NOT DETECTED.  The SARS-CoV-2 RNA  is generally detectable in upper and lower respiratory specimens during the acute phase of infection. The lowest concentration of SARS-CoV-2 viral copies this assay can detect is 250 copies / mL. A negative result does not preclude SARS-CoV-2 infection and should not be used as the sole basis for treatment or other patient management decisions.  A negative result may occur with improper specimen collection / handling, submission of specimen other than nasopharyngeal swab, presence of viral mutation(s) within the areas  targeted by this assay, and inadequate number of viral copies (<250 copies / mL). A negative result must be combined with clinical observations, patient history, and epidemiological information.  Fact Sheet for Patients:   StrictlyIdeas.no  Fact Sheet for Healthcare Providers: BankingDealers.co.za  This test is not yet approved or  cleared by the [place] FDA and has been authorized for detection and/or diagnosis of SARS-CoV-2 by FDA under an Emergency Use Authorization (EUA).  This EUA will remain in effect (meaning this test can be used) for the duration of the COVID-19 declaration under Section 564(b)(1) of the Act, 21 U.S.C. section 360bbb-3(b)(1), unless the authorization is terminated or revoked sooner.  Performed at [facility], [place] [address], [place], [place] [address]     Studies/Results: No results found.  Medications: Scheduled Meds: . celecoxib  200 mg Oral Daily  . Chlorhexidine Gluconate Cloth  6 each Topical Daily  . cholecalciferol  1,000 Units Oral Daily  . Deferasirox  1,080 mg Oral QAC breakfast  . folic acid  1 mg Oral Daily  . gabapentin  300 mg Oral TID  . HYDROmorphone   Intravenous Q4H  . ketorolac  15 mg Intravenous Q6H  . mirtazapine  45 mg Oral QHS  . oxyCODONE  10 mg Oral Q12H  . pantoprazole  40 mg Oral Daily  . rivaroxaban  20 mg Oral QAC supper  . senna-docusate  1 tablet Oral BID  . vitamin B-12  1,000 mcg Oral Daily   Continuous Infusions: . sodium chloride 50 mL/hr at 01/09/20 0551   PRN Meds:.albuterol, diphenhydrAMINE **OR** [DISCONTINUED] diphenhydrAMINE, mometasone-formoterol, naloxone **AND** sodium chloride flush, ondansetron (ZOFRAN) IV, polyethylene glycol  Consultants:  None  Procedures:  None  Antibiotics:  None  Assessment/Plan: Principal Problem:   Sickle cell anemia with crisis ([place]) Active Problems:   Leukocytosis   Anxiety and  depression   Gastro-esophageal reflux disease without esophagitis  1. Hb Sickle Cell Disease with crisis: Reduce IVF D5 .45% Saline to [facility], continue weight based Dilaudid PCA, continue IV Toradol 15 mg Q 6 H for total of 5 days, continue oral pain medications, monitor vitals very closely, Re-evaluate pain scale regularly, 2 L of Oxygen by [place]. 2. Leukocytosis: White cell count is elevated to 21.5 today, up from 15.4 yesterday.  There is still no fever or any other evidence of infection or inflammation. Most likely reactive, will continue to monitor without antibiotics.  3. Sickle Cell Anemia: Hemoglobin has has improved to 7.6 today, status post transfusion of 1 unit of packed red blood cell.  Patient reported dizziness is much improved.  We will continue to monitor closely. 4. Chronic pain Syndrome: Continue home pain medications. 5. History of PE: Continue Xarelto. 6. Intermittent asthma: Asymptomatic. Continue inhalers.  Code Status: Full Code Family Communication: N/A Disposition Plan: Not yet ready for discharge  Renessa Wellnitz  If 7PM-7AM, please contact night-coverage.  01/09/2020, 1:02 PM  LOS: 2 days

## 2020-01-10 DIAGNOSIS — F329 Major depressive disorder, single episode, unspecified: Secondary | ICD-10-CM | POA: Diagnosis not present

## 2020-01-10 DIAGNOSIS — D5700 Hb-SS disease with crisis, unspecified: Secondary | ICD-10-CM | POA: Diagnosis not present

## 2020-01-10 DIAGNOSIS — K219 Gastro-esophageal reflux disease without esophagitis: Secondary | ICD-10-CM | POA: Diagnosis not present

## 2020-01-10 DIAGNOSIS — F419 Anxiety disorder, unspecified: Secondary | ICD-10-CM | POA: Diagnosis not present

## 2020-01-10 LAB — CBC
HCT: 21.7 % — ABNORMAL LOW (ref 36.0–46.0)
Hemoglobin: 7.2 g/dL — ABNORMAL LOW (ref 12.0–15.0)
MCH: 30.4 pg (ref 26.0–34.0)
MCHC: 33.2 g/dL (ref 30.0–36.0)
MCV: 91.6 fL (ref 80.0–100.0)
Platelets: 530 10*3/uL — ABNORMAL HIGH (ref 150–400)
RBC: 2.37 MIL/uL — ABNORMAL LOW (ref 3.87–5.11)
RDW: 18.9 % — ABNORMAL HIGH (ref 11.5–15.5)
WBC: 20 10*3/uL — ABNORMAL HIGH (ref 4.0–10.5)
nRBC: 0.5 % — ABNORMAL HIGH (ref 0.0–0.2)

## 2020-01-10 MED ORDER — GUAIFENESIN-DM 100-10 MG/5ML PO SYRP: 5.0000 mL | ORAL_SOLUTION | ORAL | Status: DC | PRN

## 2020-01-10 MED ORDER — OXYCODONE HCL 5 MG PO TABS: 10.0000 mg | ORAL_TABLET | ORAL | Status: DC | PRN

## 2020-01-10 MED ORDER — HYDROMORPHONE 1 MG/ML IV SOLN
INTRAVENOUS | Status: DC
  Administered 2020-01-10: 30 mg via INTRAVENOUS
  Administered 2020-01-10: 3.5 mg via INTRAVENOUS
  Administered 2020-01-10: 1 mg via INTRAVENOUS
  Administered 2020-01-10 – 2020-01-11 (×2): 3 mg via INTRAVENOUS
  Administered 2020-01-11: 0.5 mg via INTRAVENOUS
  Administered 2020-01-11: 6 mg via INTRAVENOUS
  Filled 2020-01-10: qty 30

## 2020-01-10 NOTE — Progress Notes (Signed)
Subjective: Molly Gutierrez is a 39 year old female with a medical history significant for sickle cell disease, chronic pain syndrome, opiate dependence and tolerance, history of PE on Xarelto, history of mild intermittent asthma, and history of anemia of chronic disease that was admitted for sickle cell pain crisis.  Patient says that her pain has improved some today.  Pain intensity is 6/10.  Patient is s/p 1 unit PRBCs on 01/08/2020.  Today, patient's hemoglobin is 7.2, which is much improved.  Patient denies any headache, fatigue, dizziness, urinary symptoms, nausea, vomiting, or diarrhea.  She is afebrile and oxygen saturation is 96% on RA.  Objective:  Vital signs in last 24 hours:  Vitals:   01/10/20 0022 01/10/20 0151 01/10/20 0339 01/10/20 0502  BP:  (!) 115/89  101/72  Pulse:  (!) 107  86  Resp: 13 15 16 16   Temp:  98.7 F (37.1 C)  98.7 F (37.1 C)  TempSrc:  Oral  Oral  SpO2: 96% 95% 96% 94%  Weight:      Height:        Intake/Output from previous day:   Intake/Output Summary (Last 24 hours) at 01/10/2020 0754 Last data filed at 01/09/2020 1736 Gross per 24 hour  Intake 1762.06 ml  Output --  Net 1762.06 ml    Physical Exam: General: Alert, awake, oriented x3, in no acute distress.  HEENT: [place]/AT [place]ERL, EOMI Neck: Trachea midline,  no masses, no thyromegal,y no JVD, no carotid bruit OROPHARYNX:  Moist, No exudate/ erythema/lesions.  Heart: Regular rate and rhythm, without murmurs, rubs, gallops, PMI non-displaced, no heaves or thrills on palpation.  Lungs: Clear to auscultation, no wheezing or rhonchi noted. No increased vocal fremitus resonant to percussion  Abdomen: Soft, nontender, nondistended, positive bowel sounds, no masses no hepatosplenomegaly noted..  Neuro: No focal neurological deficits noted cranial nerves II through XII grossly intact. DTRs 2+ bilaterally upper and lower extremities. Strength 5 out of 5 in bilateral upper and lower  extremities. Musculoskeletal: No warm swelling or erythema around joints, no spinal tenderness noted. Psychiatric: Patient alert and oriented x3, good insight and cognition, good recent to remote recall. Lymph node survey: No cervical axillary or inguinal lymphadenopathy noted.  Lab Results:  Basic Metabolic Panel:    Component Value Date/Time   NA 137 01/08/2020 0542   K 3.5 01/08/2020 0542   CL 107 01/08/2020 0542   CO2 23 01/08/2020 0542   BUN 12 01/08/2020 0542   CREATININE 0.54 01/08/2020 0542   GLUCOSE 97 01/08/2020 0542   CALCIUM 8.3 (L) 01/08/2020 0542   CBC:    Component Value Date/Time   WBC 20.0 (H) 01/10/2020 0741   HGB 7.2 (L) 01/10/2020 0741   HCT 21.7 (L) 01/10/2020 0741   PLT 530 (H) 01/10/2020 0741   MCV 91.6 01/10/2020 0741   NEUTROABS 12.6 (H) 01/09/2020 0611   LYMPHSABS 4.7 (H) 01/09/2020 0611   MONOABS 2.8 (H) 01/09/2020 0611   EOSABS 1.0 (H) 01/09/2020 0611   BASOSABS 0.1 01/09/2020 0611    Recent Results (from the past 240 hour(s))  SARS Coronavirus 2 by RT PCR (hospital order, performed in [place] hospital lab) Nasopharyngeal Nasopharyngeal Swab     Status: None   Collection Time: 01/07/20  9:28 AM   Specimen: Nasopharyngeal Swab  Result Value Ref Range Status   SARS Coronavirus 2 NEGATIVE NEGATIVE Final    Comment: (NOTE) SARS-CoV-2 target nucleic acids are NOT DETECTED.  The SARS-CoV-2 RNA is generally detectable in upper and lower  respiratory specimens during the acute phase of infection. The lowest concentration of SARS-CoV-2 viral copies this assay can detect is 250 copies / mL. A negative result does not preclude SARS-CoV-2 infection and should not be used as the sole basis for treatment or other patient management decisions.  A negative result may occur with improper specimen collection / handling, submission of specimen other than nasopharyngeal swab, presence of viral mutation(s) within the areas targeted by this assay, and  inadequate number of viral copies (<250 copies / mL). A negative result must be combined with clinical observations, patient history, and epidemiological information.  Fact Sheet for Patients:   StrictlyIdeas.no  Fact Sheet for Healthcare Providers: BankingDealers.co.za  This test is not yet approved or  cleared by the [place] FDA and has been authorized for detection and/or diagnosis of SARS-CoV-2 by FDA under an Emergency Use Authorization (EUA).  This EUA will remain in effect (meaning this test can be used) for the duration of the COVID-19 declaration under Section 564(b)(1) of the Act, 21 U.S.C. section 360bbb-3(b)(1), unless the authorization is terminated or revoked sooner.  Performed at [facility], [place] [address], [place], [place] [address]     Studies/Results: No results found.  Medications: Scheduled Meds: . celecoxib  200 mg Oral Daily  . Chlorhexidine Gluconate Cloth  6 each Topical Daily  . cholecalciferol  1,000 Units Oral Daily  . Deferasirox  1,080 mg Oral QAC breakfast  . folic acid  1 mg Oral Daily  . gabapentin  300 mg Oral TID  . HYDROmorphone   Intravenous Q4H  . ketorolac  15 mg Intravenous Q6H  . mirtazapine  45 mg Oral QHS  . oxyCODONE  10 mg Oral Q12H  . pantoprazole  40 mg Oral Daily  . rivaroxaban  20 mg Oral QAC supper  . senna-docusate  1 tablet Oral BID  . vitamin B-12  1,000 mcg Oral Daily   Continuous Infusions: . sodium chloride 10 mL/hr at 01/09/20 1736   PRN Meds:.albuterol, diphenhydrAMINE **OR** [DISCONTINUED] diphenhydrAMINE, mometasone-formoterol, naloxone **AND** sodium chloride flush, ondansetron (ZOFRAN) IV, oxyCODONE, polyethylene glycol  Consultants:  None  Procedures:  None  Antibiotics:  None  Assessment/Plan: Principal Problem:   Sickle cell anemia with crisis ([place]) Active Problems:   Leukocytosis   Anxiety and depression    Gastro-esophageal reflux disease without esophagitis  Sickle cell disease with pain crisis: Continue IV fluids at [facility]  Weaning IV Dilaudid PCA, settings decreased to 0.5 mg, 10-minute lockout, and 2 mg/h. Oxycodone 10 mg every 4 hours as needed for severe breakthrough pain Toradol 15 mg IV every 6 hours for total of 5 days Monitor vital signs closely, reevaluate pain scale regularly, and supplemental oxygen as needed.  Leukocytosis: Improving.  Patient is afebrile without any signs of infection or inflammation.  More than likely reactive.  Continue to monitor without antibiotics.  Follow CBC.  Sickle cell anemia: Today, hemoglobin is 7.2, which is consistent with patient's baseline.  No further transfusions needed at this time.  Continue to monitor closely.  Follow CBC.  Chronic pain syndrome: Continue home medications  History of PE: Continue Xarelto  Mild intermittent asthma: Asymptomatic.  Continue home medications.  Code Status: Full Code Family Communication: N/A Disposition Plan: Not yet ready for discharge  [place], MSN, FNP-C Patient [place] [address] [place], [place] [address] [phone]   If 5PM-7AM, please contact night-coverage.  01/10/2020, 7:54 AM  LOS: 3 days

## 2020-01-11 DIAGNOSIS — F329 Major depressive disorder, single episode, unspecified: Secondary | ICD-10-CM | POA: Diagnosis not present

## 2020-01-11 DIAGNOSIS — D5700 Hb-SS disease with crisis, unspecified: Secondary | ICD-10-CM | POA: Diagnosis not present

## 2020-01-11 DIAGNOSIS — F419 Anxiety disorder, unspecified: Secondary | ICD-10-CM | POA: Diagnosis not present

## 2020-01-11 LAB — CBC
HCT: 21.3 % — ABNORMAL LOW (ref 36.0–46.0)
Hemoglobin: 7.1 g/dL — ABNORMAL LOW (ref 12.0–15.0)
MCH: 30.9 pg (ref 26.0–34.0)
MCHC: 33.3 g/dL (ref 30.0–36.0)
MCV: 92.6 fL (ref 80.0–100.0)
Platelets: 549 10*3/uL — ABNORMAL HIGH (ref 150–400)
RBC: 2.3 MIL/uL — ABNORMAL LOW (ref 3.87–5.11)
RDW: 19.6 % — ABNORMAL HIGH (ref 11.5–15.5)
WBC: 20.1 10*3/uL — ABNORMAL HIGH (ref 4.0–10.5)
nRBC: 0.5 % — ABNORMAL HIGH (ref 0.0–0.2)

## 2020-01-11 LAB — BASIC METABOLIC PANEL
Anion gap: 9 (ref 5–15)
BUN: 12 mg/dL (ref 6–20)
CO2: 23 mmol/L (ref 22–32)
Calcium: 8.3 mg/dL — ABNORMAL LOW (ref 8.9–10.3)
Chloride: 107 mmol/L (ref 98–111)
Creatinine, Ser: 0.46 mg/dL (ref 0.44–1.00)
GFR calc Af Amer: >60 mL/min (ref >60)
GFR calc non Af Amer: >60 mL/min (ref >60)
Glucose, Bld: 96 mg/dL (ref 70–99)
Potassium: 4 mmol/L (ref 3.5–5.1)
Sodium: 139 mmol/L (ref 135–145)

## 2020-01-11 MED ORDER — HYDROMORPHONE HCL 1 MG/ML IJ SOLN
1.0000 mg | INTRAMUSCULAR | Status: DC | PRN
  Administered 2020-01-11 – 2020-01-12 (×5): 1 mg via INTRAVENOUS
  Filled 2020-01-11 (×5): qty 1

## 2020-01-11 MED ORDER — OXYCODONE HCL 5 MG PO TABS
15.0000 mg | ORAL_TABLET | ORAL | Status: DC | PRN
  Administered 2020-01-12 (×2): 15 mg via ORAL
  Filled 2020-01-11 (×2): qty 3

## 2020-01-11 NOTE — Progress Notes (Signed)
Subjective: Molly Gutierrez is a 39 year old female with a medical history significant for sickle cell disease, chronic pain syndrome, opiate dependence and tolerance, history of PE on Xarelto, history of mild intermittent asthma, and history of anemia of chronic disease that was admitted for sickle cell pain crisis.  Patient says that pain is continuing to improve.  Pain is primarily to low back. Patient has no new complaints on today.  She denies any headache, fatigue, dizziness, urinary symptoms, nausea, vomiting, or diarrhea.  Patient is afebrile and oxygen saturation is 96% on RA.  Objective:  Vital signs in last 24 hours:  Vitals:   01/11/20 0635 01/11/20 0726 01/11/20 0944 01/11/20 1448  BP: 100/67  97/73 96/71  Pulse: 89  82 84  Resp: 14 16 16 16   Temp: 98.9 F (37.2 C)  98 F (36.7 C) 99 F (37.2 C)  TempSrc: Oral  Oral Oral  SpO2: 94% 95% 96% 95%  Weight:      Height:        Intake/Output from previous day:   Intake/Output Summary (Last 24 hours) at 01/11/2020 1636 Last data filed at 01/11/2020 1539 Gross per 24 hour  Intake 630.15 ml  Output --  Net 630.15 ml    Physical Exam: General: Alert, awake, oriented x3, in no acute distress.  HEENT: [place]/AT [place]ER[place], EOMI Neck: Trachea midline,  no masses, no thyromegal,y no JVD, no carotid bruit OROPHARYNX:  Moist, No exudate/ erythema/lesions.  Heart: Regular rate and rhythm, without murmurs, rubs, gallops, PMI non-displaced, no heaves or thrills on palpation.  Lungs: Clear to auscultation, no wheezing or rhonchi noted. No increased vocal fremitus resonant to percussion  Abdomen: Soft, nontender, nondistended, positive bowel sounds, no masses no hepatosplenomegaly noted..  Neuro: No focal neurological deficits noted cranial nerves II through XII grossly intact. DTRs 2+ bilaterally upper and lower extremities. Strength 5 out of 5 in bilateral upper and lower extremities. Musculoskeletal: No warm swelling or erythema  around joints, no spinal tenderness noted. Psychiatric: Patient alert and oriented x3, good insight and cognition, good recent to remote recall. Lymph node survey: No cervical axillary or inguinal lymphadenopathy noted.  Lab Results:  Basic Metabolic Panel:    Component Value Date/Time   NA 139 01/11/2020 0640   K 4.0 01/11/2020 0640   CL 107 01/11/2020 0640   CO2 23 01/11/2020 0640   BUN 12 01/11/2020 0640   CREATININE 0.46 01/11/2020 0640   GLUCOSE 96 01/11/2020 0640   CALCIUM 8.3 (L) 01/11/2020 0640   CBC:    Component Value Date/Time   WBC 20.1 (H) 01/11/2020 0640   HGB 7.1 (L) 01/11/2020 0640   HCT 21.3 (L) 01/11/2020 0640   PLT 549 (H) 01/11/2020 0640   MCV 92.6 01/11/2020 0640   NEUTROABS 12.6 (H) 01/09/2020 0611   LYMPHSABS 4.7 (H) 01/09/2020 0611   MONOABS 2.8 (H) 01/09/2020 0611   EOSABS 1.0 (H) 01/09/2020 0611   BASOSABS 0.1 01/09/2020 0611    Recent Results (from the past 240 hour(s))  SARS Coronavirus 2 by RT PCR (hospital order, performed in [place] hospital lab) Nasopharyngeal Nasopharyngeal Swab     Status: None   Collection Time: 01/07/20  9:28 AM   Specimen: Nasopharyngeal Swab  Result Value Ref Range Status   SARS Coronavirus 2 NEGATIVE NEGATIVE Final    Comment: (NOTE) SARS-CoV-2 target nucleic acids are NOT DETECTED.  The SARS-CoV-2 RNA is generally detectable in upper and lower respiratory specimens during the acute phase of infection.  The lowest concentration of SARS-CoV-2 viral copies this assay can detect is 250 copies / mL. A negative result does not preclude SARS-CoV-2 infection and should not be used as the sole basis for treatment or other patient management decisions.  A negative result may occur with improper specimen collection / handling, submission of specimen other than nasopharyngeal swab, presence of viral mutation(s) within the areas targeted by this assay, and inadequate number of viral copies (<250 copies / mL). A negative  result must be combined with clinical observations, patient history, and epidemiological information.  Fact Sheet for Patients:   StrictlyIdeas.no  Fact Sheet for Healthcare Providers: BankingDealers.co.za  This test is not yet approved or  cleared by the [place] FDA and has been authorized for detection and/or diagnosis of SARS-CoV-2 by FDA under an Emergency Use Authorization (EUA).  This EUA will remain in effect (meaning this test can be used) for the duration of the COVID-19 declaration under Section 564(b)(1) of the Act, 21 U.S.C. section 360bbb-3(b)(1), unless the authorization is terminated or revoked sooner.  Performed at [facility], [place] [address], [place], [place] [address]     Studies/Results: No results found.  Medications: Scheduled Meds: . celecoxib  200 mg Oral Daily  . Chlorhexidine Gluconate Cloth  6 each Topical Daily  . cholecalciferol  1,000 Units Oral Daily  . Deferasirox  1,080 mg Oral QAC breakfast  . folic acid  1 mg Oral Daily  . gabapentin  300 mg Oral TID  . ketorolac  15 mg Intravenous Q6H  . mirtazapine  45 mg Oral QHS  . oxyCODONE  10 mg Oral Q12H  . pantoprazole  40 mg Oral Daily  . rivaroxaban  20 mg Oral QAC supper  . senna-docusate  1 tablet Oral BID  . vitamin B-12  1,000 mcg Oral Daily   Continuous Infusions: . sodium chloride Stopped (01/11/20 0848)   PRN Meds:.albuterol, guaiFENesin-dextromethorphan, HYDROmorphone (DILAUDID) injection, mometasone-formoterol, oxyCODONE, polyethylene glycol  Consultants:  None  Procedures:  None  Antibiotics:  None  Assessment/Plan: Principal Problem:   Sickle cell anemia with crisis ([place]) Active Problems:   Leukocytosis   Anxiety and depression   Gastro-esophageal reflux disease without esophagitis  Sickle cell disease with pain crisis: Discontinue IV Dilaudid PCA Oxycodone 15 mg every 4 hours as needed for  severe breakthrough pain Dilaudid 1 mg every 4 hours as needed Toradol 15 mg IV every 6 hours for total of 5 days Monitor vital signs closely, supplemental oxygen as needed, and reevaluate pain scale regularly.  Leukocytosis: Stable.  Patient is afebrile without any signs of infection or inflammation.  Continue to monitor closely without antibiotics.  Follow CBC in a.m.  Sickle cell anemia: Hemoglobin is stable and consistent with patient's baseline.  No further transfusion needs at this time.  Continue to monitor closely.  Follow CBC closely.  Chronic pain syndrome: Continue home medications  History of PE: Continue Xarelto  Mild intermittent asthma: Stable.  Well-controlled.  Continue home medications.   Code Status: Full Code Family Communication: N/A Disposition Plan: Not yet ready for discharge  [place], MSN, FNP-C Patient [place] [address] [place], [place] [address] [phone]  If 5PM-7AM, please contact night-coverage.  01/11/2020, 4:36 PM  LOS: 4 days

## 2020-01-11 NOTE — Care Management Important Message (Signed)
Important Message  Patient Details IM Letter presented to the Patient Name: Molly Gutierrez MRN: [id number] Date of Birth: May 11, 1981   Medicare Important Message Given:  Yes     Kerin Salen 01/11/2020, 4:08 PM

## 2020-01-12 DIAGNOSIS — D5700 Hb-SS disease with crisis, unspecified: Secondary | ICD-10-CM | POA: Diagnosis not present

## 2020-01-12 LAB — CBC
HCT: 22.9 % — ABNORMAL LOW (ref 36.0–46.0)
Hemoglobin: 7.4 g/dL — ABNORMAL LOW (ref 12.0–15.0)
MCH: 29.8 pg (ref 26.0–34.0)
MCHC: 32.3 g/dL (ref 30.0–36.0)
MCV: 92.3 fL (ref 80.0–100.0)
Platelets: 603 10*3/uL — ABNORMAL HIGH (ref 150–400)
RBC: 2.48 MIL/uL — ABNORMAL LOW (ref 3.87–5.11)
RDW: 19.1 % — ABNORMAL HIGH (ref 11.5–15.5)
WBC: 15.5 10*3/uL — ABNORMAL HIGH (ref 4.0–10.5)
nRBC: 0.5 % — ABNORMAL HIGH (ref 0.0–0.2)

## 2020-01-12 MED ORDER — HEPARIN SOD (PORK) LOCK FLUSH 100 UNIT/ML IV SOLN
500.0000 [IU] | Freq: Once | INTRAVENOUS | Status: AC
  Administered 2020-01-12: 500 [IU] via INTRAVENOUS
  Filled 2020-01-12: qty 5

## 2020-01-12 NOTE — Discharge Summary (Signed)
Physician Discharge Summary  [facility] HRC:[id number] DOB: 02-11-1981 DOA: 01/07/2020  PCP: Vevelyn Francois, NP  Admit date: 01/07/2020  Discharge date: 01/12/2020  Discharge Diagnoses:  Principal Problem:   Sickle cell anemia with crisis [facility]) Active Problems:   Leukocytosis   Anxiety and depression   Gastro-esophageal reflux disease without esophagitis   Discharge Condition: Stable  Disposition:  Pt is discharged home in good condition and is to follow up with Vevelyn Francois, NP this week to have labs evaluated. Molly Gutierrez is instructed to increase activity slowly and balance with rest for the next few days, and use prescribed medication to complete treatment of pain  Diet: Regular Wt Readings from Last 3 Encounters:  01/07/20 57.6 kg  11/12/19 55.8 kg  09/15/19 57.6 kg    History of present illness:  Molly Gutierrez is a 39 year old female with a medical history significant for sickle cell disease, chronic pain syndrome, anemia of chronic disease, history of PE on Xarelto, history of intermittent asthma and eczema, who is opiate tolerant and dependent presents to the ER this morning with major complaints of pain especially in her chest area.  She claims that her pain has been ongoing for few days and no longer responded to her home medications.  She claims this pain is typical of her sickle cell pain crisis.  She describes her pain as constant and throbbing, all over, but mostly in lower extremities.  Chest area.  She rates her pain 10/10 despite repeated treatment in the ER.  She denies any cough or shortness of breath.  She also denies any dizziness, headache, fever.  She has no sick contacts, recent travel, or exposure to COVID-19.  ER course: Vital signs recorded as BP 121/91, pulse 83, temperature 98.5 F, respirations 18, oxygen saturation 97% on RA.  Patient found to be afebrile and in no acute distress.  COVID-19 negative, hemoglobin of 8.2, which  is at patient's baseline.  WBC count elevated to 18.4.  Comprehensive metabolic panel was largely within normal limits.  CT angiogram showed no CT findings for pulmonary embolism, moderate vascular congestion and patchy groundglass opacities suggesting mild edema or alveolitis.  After repeated doses of IV Dilaudid for pain, patient reported no relief, patient will be admitted to the hospital for further evaluation and management of sickle cell pain crisis.  Hospital Course:   Sickle cell disease with pain crisis: Patient was admitted for sickle cell pain crisis and managed appropriately with IVF, IV Dilaudid via PCA and IV Toradol, as well as other adjunct therapies per sickle cell pain management protocols.  IV Dilaudid PCA was weaned appropriately and patient was transitioned to home opiate medication regimen.  OxyContin was continued throughout admission without interruption.  Patient advised to resume home medications.  Pain intensity 5/10 and continues to be primarily to central chest and low back.  Patient has an appointment scheduled with hematology on 01/13/2020.  We will repeat labs at that time.  Sickle cell anemia: Patient's baseline hemoglobin is 7.0-8.0 g/dL.  Hemoglobin decreased to 6.7.  Patient was transfused 1 unit PRBCs.  Hemoglobin returned to baseline and is 7.4 prior to discharge.  Patient will follow-up with PCP in 1 week for medication management and to repeat CBC with differential.  All chronic medications were continued throughout admission without interruption.  Patient is alert, oriented, and oxygen saturation is 97% on RA.  She is also afebrile.  Patient was discharged home today in a hemodynamically stable condition.   Discharge  Exam: Vitals:   01/12/20 0622 01/12/20 1003  BP: 115/76 112/70  Pulse: 75 72  Resp: 15 18  Temp: 98.5 F (36.9 C) 98.3 F (36.8 C)  SpO2: 98% 97%   Vitals:   01/11/20 2213 01/12/20 0210 01/12/20 0622 01/12/20 1003  BP: 112/78 116/75  115/76 112/70  Pulse: 73 77 75 72  Resp: 14 14 15 18   Temp: 99.2 F (37.3 C) 98.8 F (37.1 C) 98.5 F (36.9 C) 98.3 F (36.8 C)  TempSrc: Oral Oral Oral Oral  SpO2: 95% 97% 98% 97%  Weight:      Height:        General appearance : Awake, alert, not in any distress. Speech Clear. Not toxic looking HEENT: Atraumatic and Normocephalic, pupils equally reactive to light and accomodation Neck: Supple, no JVD. No cervical lymphadenopathy.  Chest: Good air entry bilaterally, no added sounds  CVS: S1 S2 regular, no murmurs.  Abdomen: Bowel sounds present, Non tender and not distended with no gaurding, rigidity or rebound. Extremities: B/L Lower Ext shows no edema, both legs are warm to touch Neurology: Awake alert, and oriented X 3, CN II-XII intact, Non focal Skin: No Rash  Discharge Instructions  Discharge Instructions    Discharge patient   Complete by: As directed    Discharge disposition: 01-Home or Self Care   Discharge patient date: 01/12/2020     Allergies as of 01/12/2020      Reactions   Cefaclor Hives, Swelling   Hydroxyurea Palpitations, Other (See Comments)   Lowers "blood levels" and heart rate (causes HYPOtension); "it messes me up, it drops my levels and stuff" Other reaction(s): Hypotension, Other (See Comments) "it messes me up, it drops my levels and stuff" Lower blood levels and HR Other reaction(s): Other (See Comments) Lowers "blood levels" and heart rate (causes HYPOtension); "it messes me up, it drops my levels and stuff"   Ketamine Palpitations, Other (See Comments)   "Pt states she has had previous reaction to ketamine. States she becomes flushed, heart races, dizzy, and feels like she is going to pass out." Other reaction(s): Other (See Comments), Other (See Comments) "Pt states she has had previous reaction to ketamine. States she becomes flushed, heart races, dizzy, and feels like she is going to pass out." "Pt states she has had previous reaction to  ketamine. States she becomes flushed, heart races, dizzy, and feels like she is going to pass out."      Medication List    TAKE these medications   albuterol 108 (90 Base) MCG/ACT inhaler Commonly known as: VENTOLIN HFA Inhale 2 puffs into the lungs 2 (two) times daily as needed for wheezing or shortness of breath.   celecoxib 200 MG capsule Commonly known as: CELEBREX Take 200 mg by mouth daily.   diphenhydrAMINE 25 mg capsule Commonly known as: BENADRYL Take 25 mg by mouth 3 (three) times daily as needed for itching.   folic acid 1 MG tablet Commonly known as: FOLVITE Take 1 mg by mouth daily.   gabapentin 300 MG capsule Commonly known as: NEURONTIN Take 300 mg by mouth 3 (three) times daily.   HYDROmorphone 4 MG tablet Commonly known as: DILAUDID Take 4 mg by mouth every 6 (six) hours as needed for moderate pain or severe pain.   Jadenu 360 MG Tabs Generic drug: Deferasirox Take 1,080 mg by mouth daily before breakfast.   mirtazapine 15 MG tablet Commonly known as: REMERON Take 45 mg by mouth at bedtime.  mometasone-formoterol 100-5 MCG/ACT Aero Commonly known as: DULERA Inhale 2 puffs into the lungs daily as needed for wheezing or shortness of breath.   omeprazole 20 MG capsule Commonly known as: PRILOSEC Take 20 mg by mouth 2 (two) times a day.   ondansetron 4 MG tablet Commonly known as: ZOFRAN Take 1 tablet (4 mg total) by mouth every 8 (eight) hours as needed for nausea or vomiting.   Oxycodone HCl 10 MG Tabs Take 1 tablet (10 mg total) by mouth every 4 (four) hours as needed (pain). Every 4 hours while awake   oxyCODONE 10 mg 12 hr tablet Commonly known as: OXYCONTIN Take 1 tablet (10 mg total) by mouth every 12 (twelve) hours.   promethazine 25 MG suppository Commonly known as: PHENERGAN Place 1 suppository (25 mg total) rectally every 6 (six) hours as needed for nausea or vomiting.   vitamin B-12 1000 MCG tablet Commonly known as:  CYANOCOBALAMIN Take 1,000 mcg by mouth daily.   Vitamin D3 25 MCG (1000 UT) Caps Take 1,000 Units by mouth daily.   Xarelto 20 MG Tabs tablet Generic drug: rivaroxaban Take 1 tablet (20 mg total) by mouth daily. What changed: when to take this       The results of significant diagnostics from this hospitalization (including imaging, microbiology, ancillary and laboratory) are listed below for reference.    Significant Diagnostic Studies: CT Angio Chest PE W and/or Wo Contrast  Result Date: 01/07/2020 CLINICAL DATA:  Chest pain and shortness of breath. Sickle cell disease. EXAM: CT ANGIOGRAPHY CHEST WITH CONTRAST TECHNIQUE: Multidetector CT imaging of the chest was performed using the standard protocol during bolus administration of intravenous contrast. Multiplanar CT image reconstructions and MIPs were obtained to evaluate the vascular anatomy. CONTRAST:  72mL OMNIPAQUE IOHEXOL 350 MG/ML SOLN COMPARISON:  07/07/2019 FINDINGS: Cardiovascular: The heart is mildly enlarged but stable. No pericardial effusion. The aorta is normal in caliber. No dissection or atherosclerotic calcifications. No definite coronary artery calcifications. The pulmonary arterial tree is well opacified. No filling defects to suggest pulmonary embolism. Bilateral Port-A-Cath are noted. Mediastinum/Nodes: Small scattered mediastinal and hilar lymph nodes are stable. No mass or overt adenopathy. The esophagus is grossly normal. Lungs/Pleura: Moderate vascular congestion and patchy ground-glass opacity suggesting mild edema or alveolitis. No focal airspace consolidation or pleural effusions. No pneumothorax. No worrisome pulmonary lesions. There is a stable right paraspinal mass at T8-9 most likely an area of extra medullary hematopoiesis. Upper Abdomen: No significant upper abdominal findings. 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 Musculoskeletal: Stable macro calcifications in the right breast. No supraclavicular or  axillary adenopathy. The bony structures are intact. Typical findings of sickle cell changes involving the osseous structures. Review of the MIP images confirms the above findings. IMPRESSION: 1. No CT findings for pulmonary embolism. 2. Normal thoracic aorta. 3. Moderate vascular congestion and patchy ground-glass opacity suggesting mild edema or alveolitis. 4. 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 Electronically Signed   By: Marijo Sanes M.D.   On: 01/07/2020 07:51   DG Chest Portable 1 View  Result Date: 01/07/2020 CLINICAL DATA:  Chest pain. Shortness of breath. Sickle cell disease. EXAM: PORTABLE CHEST 1 VIEW COMPARISON:  Chest x-ray 11/12/2019 FINDINGS: Bilateral Port-A-Cath is in good position without complicating features. Low lung volumes with vascular crowding and streaky basilar atelectasis. No infiltrates or effusions. Borderline cardiac enlargement extension weighted by the AP portable technique and by the low lung volumes. The bony structures  are intact. IMPRESSION: Low lung volumes with vascular crowding and streaky basilar atelectasis. Electronically Signed   By: Marijo Sanes M.D.   On: 01/07/2020 06:35    Microbiology: Recent Results (from the past 240 hour(s))  SARS Coronavirus 2 by RT PCR (hospital order, performed in [facility] hospital lab) Nasopharyngeal Nasopharyngeal Swab     Status: None   Collection Time: 01/07/20  9:28 AM   Specimen: Nasopharyngeal Swab  Result Value Ref Range Status   SARS Coronavirus 2 NEGATIVE NEGATIVE Final    Comment: (NOTE) SARS-CoV-2 target nucleic acids are NOT DETECTED.  The SARS-CoV-2 RNA is generally detectable in upper and lower respiratory specimens during the acute phase of infection. The lowest concentration of SARS-CoV-2 viral copies this assay can detect is 250 copies / mL. A negative result does not preclude SARS-CoV-2 infection and should not be used as the sole basis for  treatment or other patient management decisions.  A negative result may occur with improper specimen collection / handling, submission of specimen other than nasopharyngeal swab, presence of viral mutation(s) within the areas targeted by this assay, and inadequate number of viral copies (<250 copies / mL). A negative result must be combined with clinical observations, patient history, and epidemiological information.  Fact Sheet for Patients:   StrictlyIdeas.no  Fact Sheet for Healthcare Providers: BankingDealers.co.za  This test is not yet approved or  cleared by the [place] FDA and has been authorized for detection and/or diagnosis of SARS-CoV-2 by FDA under an Emergency Use Authorization (EUA).  This EUA will remain in effect (meaning this test can be used) for the duration of the COVID-19 declaration under Section 564(b)(1) of the Act, 21 U.S.C. section 360bbb-3(b)(1), unless the authorization is terminated or revoked sooner.  Performed at [facility], [place] [address] [place], [place] [address]      Labs: Basic Metabolic Panel: Recent Labs  Lab 01/07/20 0219 01/07/20 0219 01/07/20 0535 01/07/20 0535 01/08/20 0542 01/11/20 0640  NA 137  --  142  --  137 139  K 3.6   < > 3.5   < > 3.5 4.0  CL 106  --  107  --  107 107  CO2 21*  --   --   --  23 23  GLUCOSE 102*  --  101*  --  97 96  BUN 13  --  13  --  12 12  CREATININE 0.54  --  0.50  --  0.54 0.46  CALCIUM 9.1  --   --   --  8.3* 8.3*   < > = values in this interval not displayed.   Liver Function Tests: Recent Labs  Lab 01/07/20 0219 01/08/20 0542  AST 28 24  ALT 19 15  ALKPHOS 73 59  BILITOT 3.4* 2.8*  PROT 8.4* 6.8  ALBUMIN 4.6 3.6   No results for input(s): LIPASE, AMYLASE in the last 168 hours. No results for input(s): AMMONIA in the last 168 hours. CBC: Recent Labs  Lab 01/07/20 0219 01/07/20 0535 01/08/20 0542  01/09/20 0611 01/10/20 0741 01/11/20 0640 01/12/20 0830  WBC 18.4*   < > 15.4* 21.5* 20.0* 20.1* 15.5*  NEUTROABS 10.5*  --  7.9* 12.6*  --   --   --   HGB 8.2*   < > 6.7* 7.6* 7.2* 7.1* 7.4*  HCT 24.3*   < > 20.0* 23.0* 21.7* 21.3* 22.9*  MCV 89.7   < > 91.3 90.9 91.6 92.6 92.3  PLT  518*   < > 484* 500* 530* 549* 603*   < > = values in this interval not displayed.   Cardiac Enzymes: No results for input(s): CKTOTAL, CKMB, CKMBINDEX, TROPONINI in the last 168 hours. BNP: Invalid input(s): POCBNP CBG: No results for input(s): GLUCAP in the last 168 hours.  Time coordinating discharge: 40 minutes  Signed:  [name]  APRN, MSN, FNP-C [facility] [address] [place], [place] [address] [phone]  [facility] 01/12/2020, 12:06 PM

## 2020-01-12 NOTE — Progress Notes (Signed)
Pt discharged to home. DC instructions given. No concerns voiced. Left unit stable with no issues.

## 2020-01-26 ENCOUNTER — Encounter (HOSPITAL_COMMUNITY): Payer: Self-pay | Admitting: Emergency Medicine

## 2020-01-26 ENCOUNTER — Emergency Department (HOSPITAL_COMMUNITY): Payer: Medicare Other

## 2020-01-26 ENCOUNTER — Other Ambulatory Visit: Payer: Self-pay

## 2020-01-26 DIAGNOSIS — J45909 Unspecified asthma, uncomplicated: Secondary | ICD-10-CM | POA: Insufficient documentation

## 2020-01-26 DIAGNOSIS — D571 Sickle-cell disease without crisis: Secondary | ICD-10-CM | POA: Diagnosis not present

## 2020-01-26 DIAGNOSIS — R079 Chest pain, unspecified: Secondary | ICD-10-CM | POA: Diagnosis not present

## 2020-01-26 DIAGNOSIS — F1120 Opioid dependence, uncomplicated: Secondary | ICD-10-CM | POA: Insufficient documentation

## 2020-01-26 DIAGNOSIS — Z7901 Long term (current) use of anticoagulants: Secondary | ICD-10-CM | POA: Diagnosis not present

## 2020-01-26 DIAGNOSIS — Z95828 Presence of other vascular implants and grafts: Secondary | ICD-10-CM | POA: Insufficient documentation

## 2020-01-26 DIAGNOSIS — D57219 Sickle-cell/Hb-C disease with crisis, unspecified: Secondary | ICD-10-CM | POA: Insufficient documentation

## 2020-01-26 NOTE — ED Triage Notes (Signed)
Arrived POV from home. Patient reports sickle cell chest and back pain that started yesterday. Patient states home pain medications not working.

## 2020-01-27 ENCOUNTER — Emergency Department (HOSPITAL_COMMUNITY)
Admission: EM | Admit: 2020-01-27 | Discharge: 2020-01-27 | Disposition: A | Discharge status: Home / Self Care | Payer: Medicare Other | Attending: Emergency Medicine | Admitting: Emergency Medicine

## 2020-01-27 ENCOUNTER — Other Ambulatory Visit: Payer: Self-pay

## 2020-01-27 DIAGNOSIS — D5700 Hb-SS disease with crisis, unspecified: Secondary | ICD-10-CM

## 2020-01-27 DIAGNOSIS — D57219 Sickle-cell/Hb-C disease with crisis, unspecified: Secondary | ICD-10-CM | POA: Diagnosis not present

## 2020-01-27 LAB — COMPREHENSIVE METABOLIC PANEL
ALT: 18 U/L (ref 0–44)
AST: 27 U/L (ref 15–41)
Albumin: 3.9 g/dL (ref 3.5–5.0)
Alkaline Phosphatase: 57 U/L (ref 38–126)
Anion gap: 9 (ref 5–15)
BUN: 8 mg/dL (ref 6–20)
CO2: 20 mmol/L — ABNORMAL LOW (ref 22–32)
Calcium: 8 mg/dL — ABNORMAL LOW (ref 8.9–10.3)
Chloride: 107 mmol/L (ref 98–111)
Creatinine, Ser: 0.32 mg/dL — ABNORMAL LOW (ref 0.44–1.00)
GFR calc Af Amer: >60 mL/min (ref >60)
GFR calc non Af Amer: >60 mL/min (ref >60)
Glucose, Bld: 88 mg/dL (ref 70–99)
Potassium: 3 mmol/L — ABNORMAL LOW (ref 3.5–5.1)
Sodium: 136 mmol/L (ref 135–145)
Total Bilirubin: 2.6 mg/dL — ABNORMAL HIGH (ref 0.3–1.2)
Total Protein: 6.9 g/dL (ref 6.5–8.1)

## 2020-01-27 LAB — CBC WITH DIFFERENTIAL/PLATELET
Abs Immature Granulocytes: 0.08 10*3/uL — ABNORMAL HIGH (ref 0.00–0.07)
Basophils Absolute: 0.1 10*3/uL (ref 0.0–0.1)
Basophils Relative: 1 %
Eosinophils Absolute: 0.4 10*3/uL (ref 0.0–0.5)
Eosinophils Relative: 2 %
HCT: 28.8 % — ABNORMAL LOW (ref 36.0–46.0)
Hemoglobin: 9.2 g/dL — ABNORMAL LOW (ref 12.0–15.0)
Immature Granulocytes: 1 %
Lymphocytes Relative: 20 %
Lymphs Abs: 3.5 10*3/uL (ref 0.7–4.0)
MCH: 30.9 pg (ref 26.0–34.0)
MCHC: 31.9 g/dL (ref 30.0–36.0)
MCV: 96.6 fL (ref 80.0–100.0)
Monocytes Absolute: 2 10*3/uL — ABNORMAL HIGH (ref 0.1–1.0)
Monocytes Relative: 11 %
Neutro Abs: 11.4 10*3/uL — ABNORMAL HIGH (ref 1.7–7.7)
Neutrophils Relative %: 65 %
Platelets: 482 10*3/uL — ABNORMAL HIGH (ref 150–400)
RBC: 2.98 MIL/uL — ABNORMAL LOW (ref 3.87–5.11)
RDW: 16.8 % — ABNORMAL HIGH (ref 11.5–15.5)
WBC: 17.3 10*3/uL — ABNORMAL HIGH (ref 4.0–10.5)
nRBC: 0.2 % (ref 0.0–0.2)

## 2020-01-27 LAB — RETICULOCYTES
Immature Retic Fract: 28.1 % — ABNORMAL HIGH (ref 2.3–15.9)
RBC.: 3.01 MIL/uL — ABNORMAL LOW (ref 3.87–5.11)
Retic Count, Absolute: 222.4 10*3/uL — ABNORMAL HIGH (ref 19.0–186.0)
Retic Ct Pct: 7.4 % — ABNORMAL HIGH (ref 0.4–3.1)

## 2020-01-27 LAB — TROPONIN I (HIGH SENSITIVITY): Troponin I (High Sensitivity): <2 ng/L (ref <18)

## 2020-01-27 MED ORDER — HYDROMORPHONE HCL 2 MG/ML IJ SOLN
2.0000 mg | INTRAMUSCULAR | Status: AC
  Administered 2020-01-27: 2 mg via INTRAVENOUS
  Filled 2020-01-27: qty 1

## 2020-01-27 MED ORDER — HEPARIN SOD (PORK) LOCK FLUSH 100 UNIT/ML IV SOLN
500.0000 [IU] | Freq: Once | INTRAVENOUS | Status: AC
  Administered 2020-01-27: 500 [IU]
  Filled 2020-01-27: qty 5

## 2020-01-27 MED ORDER — DEXTROSE-NACL 5-0.45 % IV SOLN: INTRAVENOUS | Status: DC

## 2020-01-27 MED ORDER — ONDANSETRON HCL 4 MG/2ML IJ SOLN
4.0000 mg | INTRAMUSCULAR | Status: DC | PRN
  Administered 2020-01-27: 4 mg via INTRAVENOUS
  Filled 2020-01-27: qty 2

## 2020-01-27 MED ORDER — KETOROLAC TROMETHAMINE 15 MG/ML IJ SOLN
15.0000 mg | INTRAMUSCULAR | Status: AC
  Administered 2020-01-27: 15 mg via INTRAVENOUS
  Filled 2020-01-27: qty 1

## 2020-01-27 MED ORDER — DIPHENHYDRAMINE HCL 25 MG PO CAPS
25.0000 mg | ORAL_CAPSULE | Freq: Once | ORAL | Status: AC
  Administered 2020-01-27: 25 mg via ORAL
  Filled 2020-01-27: qty 1

## 2020-01-27 MED ORDER — HEPARIN SOD (PORK) LOCK FLUSH 1 UNIT/ML IV SOLN: 0.5000 mL | INTRAVENOUS | Status: DC | PRN

## 2020-01-27 NOTE — Discharge Instructions (Signed)
You were seen in the emergency department today with sickle cell crisis.  Please follow closely with your hematology team and return to the sickle cell day Hospital if your symptoms continue.

## 2020-01-27 NOTE — ED Notes (Signed)
Message has been sent to Dr. Laverta Baltimore, patient is requesting more pain medicine and benadryl.  Awaiting for a response at this time.

## 2020-01-27 NOTE — ED Notes (Signed)
Patient here from home with c/o chest and back pain,  Denies sob, diaphoresis, or radiation.  Patient appears to be resting well on the stretcher with eyes closed, respirations equal and unlabored.

## 2020-01-27 NOTE — ED Provider Notes (Signed)
Emergency Department Provider Note   I have reviewed the triage vital signs and the nursing notes.   HISTORY  Chief Complaint Sickle Cell Pain Crisis   HPI Molly Gutierrez is a 39 y.o. female with past medical history of asthma, PE, sickle cell anemia presents to the emergency department with aching pain in the chest and back typical of her sickle cell crisis pain.  She denies any fevers or chills.  No associated shortness of breath.  She has had acute chest in the past and states do not feel similar.  She is not experiencing GI symptoms.  She is been taking her home pain medications including hydromorphone with no lasting relief in symptoms.  No unilateral numbness or weakness.  Denies any sudden/severe headaches.  Chest pain is severe, throbbing in quality, central and without radiation.    Past Medical History:  Diagnosis Date  . Asthma   . Eczema   . History of pulmonary embolus (PE)   . Sickle cell anemia [facility])     Patient Active Problem List   Diagnosis Date Noted  . Sickle cell anemia with crisis ([place]) 01/07/2020  . Sickle cell crisis acute chest syndrome ([place]) 07/29/2019  . Drug-seeking behavior 07/07/2019  . Sinus tachycardia 07/07/2019  . Therapeutic opioid-induced constipation (OIC) 07/07/2019  . Breast mass in female 06/29/2019  . Atelectasis of right lung 06/29/2019  . Sickle cell crisis ([place]) 06/27/2019  . Sickle cell pain crisis ([place]) 04/19/2019  . Pneumonia 03/27/2019  . 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 Anterior chest wall pain 11/13/2018  . Epigastric abdominal pain 11/13/2018  . Hematuria 11/13/2018  . Hyperbilirubinemia 11/12/2018  . Marleen Moret term current use of anticoagulant therapy 11/12/2018  . History of pulmonary embolism 09/26/2018  . Acute pulmonary embolism ([place]) 09/26/2018  . Opioid dependence ([place]) 09/13/2018  . Port-A-Cath in place 09/13/2018  . Narcotic abuse, continuous ([place]) 08/28/2018  . Moderate persistent asthma without  complication 16/03/9603  . Anemia 07/03/2018  . Personal history of other venous thrombosis and embolism 07/03/2018  . History of transfusion 07/03/2018  . Gastro-esophageal reflux disease without esophagitis 06/02/2018  . Asthma 05/19/2018  . Anxiety and depression 10/30/2017  . At risk for sepsis 06/13/2017  . Mitral regurgitation 02/01/2014  . Itching 12/13/2013  . Nausea & vomiting 12/13/2013  . 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 Frequent complaints of pain 11/01/2013  . Hypokalemia 09/19/2013  . S/P total hip arthroplasty 05/19/2013  . 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 Lower urinary tract infectious disease 03/02/2013  . Chest pain 11/14/2012  . Sickle-cell anemia ([place]) 10/30/2012  . Pain management 08/01/2012  . Reticulocytosis 07/31/2012  . 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 Leukocytosis 03/27/2012  . History of gestational diabetes 10/07/2011  . 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 CHOLECYSTECTOMY    . ERCP    . JOINT REPLACEMENT    . PORTA CATH INSERTION    . TUBAL LIGATION    . WISDOM TOOTH EXTRACTION      Allergies Cefaclor, Hydroxyurea, and Ketamine  Family History  Problem Relation Age of Onset  . Renal Disease Mother   . Hypertension Mother   . High Cholesterol Mother     Social History Social History   Tobacco Use  . Smoking status: Never Smoker  .  Smokeless tobacco: Never Used  Vaping Use  . Vaping Use: Never used  Substance Use Topics  . Alcohol use: Never  . Drug use: Never    Review of Systems  Constitutional: No fever/chills Eyes: No visual changes. ENT: No sore throat. Cardiovascular: Positive chest pain. Respiratory: Denies shortness of breath. Gastrointestinal: No abdominal pain.   No nausea, no vomiting.  No diarrhea.  No constipation. Genitourinary: Negative for dysuria. Musculoskeletal: Positive for back pain. Skin: Negative for rash. Neurological: Negative for headaches, focal weakness or numbness.  10-point ROS otherwise negative.  ____________________________________________   PHYSICAL EXAM:  VITAL SIGNS: ED Triage Vitals  Enc Vitals Group     BP 01/27/20 0248 115/76     Pulse Rate 01/27/20 0248 95     Resp 01/27/20 0248 15     Temp 01/27/20 0248 99 F (37.2 C)     Temp Source 01/27/20 0248 Oral     SpO2 01/27/20 0248 100 %     Weight 01/27/20 0252 127 lb (57.6 kg)     Height 01/27/20 0252 5\' 3"  (1.6 m)   Constitutional: Alert and oriented. Well appearing and in no acute distress. Eyes: Conjunctivae are normal. Head: Atraumatic. Nose: No congestion/rhinnorhea. Mouth/Throat: Mucous membranes are moist.   Neck: No stridor.  Cardiovascular: Normal rate, regular rhythm. Good peripheral circulation. Grossly normal heart sounds.   Respiratory: Normal respiratory effort.  No retractions. Lungs CTAB. Gastrointestinal: Soft and nontender. No distention.  Musculoskeletal: No gross deformities of extremities. Neurologic:  Normal speech and language Skin:  Skin is warm, dry and intact. No rash noted.   ____________________________________________   LABS (all labs ordered are listed, but only abnormal results are displayed)  Labs Reviewed  COMPREHENSIVE METABOLIC PANEL - Abnormal; Notable for the following components:      Result Value   Potassium 3.0 (*)    CO2 20 (*)    Creatinine, Ser 0.32 (*)    Calcium 8.0 (*)    Total Bilirubin 2.6 (*)    All other components within normal limits  CBC WITH DIFFERENTIAL/PLATELET - Abnormal; Notable for the following components:   WBC 17.3 (*)    RBC 2.98 (*)    Hemoglobin 9.2 (*)    HCT 28.8 (*)    RDW 16.8 (*)    Platelets 482 (*)    Neutro Abs 11.4 (*)    Monocytes Absolute 2.0 (*)    Abs Immature  Granulocytes 0.08 (*)    All other components within normal limits  RETICULOCYTES - Abnormal; Notable for the following components:   Retic Ct Pct 7.4 (*)    RBC. 3.01 (*)    Retic Count, Absolute 222.4 (*)    Immature Retic Fract 28.1 (*)    All other components within normal limits  I-STAT BETA HCG BLOOD, ED (MC, [facility], AP ONLY)  TROPONIN I (HIGH SENSITIVITY)   ____________________________________________  EKG   EKG Interpretation  Date/Time:  Wednesday January 26 2020 23:18:18 EDT Ventricular Rate:  96 PR Interval:    QRS Duration: 80 QT Interval:  368 QTC Calculation: 463 R Axis:   54 Text Interpretation: Sinus rhythm Abnormal R-wave progression, early transition Borderline T wave abnormalities 12 Lead; [place] No STEMI Confirmed by Nanda Quinton [phone]) on 01/27/2020 3:29:08 AM       ____________________________________________  RADIOLOGY  No results found.  ____________________________________________   PROCEDURES  Procedure(s) performed:   Procedures  CRITICAL CARE Performed by: Margette Fast Total critical care time: 35 minutes Critical  care time was exclusive of separately billable procedures and treating other patients. Critical care was necessary to treat or prevent imminent or life-threatening deterioration. Critical care was time spent personally by me on the following activities: development of treatment plan with patient and/or surrogate as well as nursing, discussions with consultants, evaluation of patient's response to treatment, examination of patient, obtaining history from patient or surrogate, ordering and performing treatments and interventions, ordering and review of laboratory studies, ordering and review of radiographic studies, pulse oximetry and re-evaluation of patient's condition.  Nanda Quinton, MD Emergency Medicine  ____________________________________________   INITIAL IMPRESSION / ASSESSMENT AND PLAN / ED COURSE  Pertinent labs  & imaging results that were available during my care of the patient were reviewed by me and considered in my medical decision making (see chart for details).   Patient presents emergency department for evaluation of sickle cell crisis pain typical of her sickle cell.  She has largely unremarkable vital signs.  No fever.  Doubt acute chest clinically.  No infiltrate on chest x-ray.  EKG with no acute ischemic findings.  Patient received multiple doses of pain medication along with IV fluids per protocol.  She is feeling improved and ready to go home.  She has a sober ride home arranged.  Plan for close hematology follow-up along with return to the sickle cell day center should symptoms return. Discussed ED return precautions.  ____________________________________________  FINAL CLINICAL IMPRESSION(S) / ED DIAGNOSES  Final diagnoses:  Sickle cell pain crisis ([place])     MEDICATIONS GIVEN DURING THIS VISIT:  Medications  ketorolac (TORADOL) 15 MG/ML injection 15 mg (15 mg Intravenous Given 01/27/20 0355)  HYDROmorphone (DILAUDID) injection 2 mg (2 mg Intravenous Given 01/27/20 0356)  HYDROmorphone (DILAUDID) injection 2 mg (2 mg Intravenous Given 01/27/20 0454)  HYDROmorphone (DILAUDID) injection 2 mg (2 mg Intravenous Given 01/27/20 0612)  diphenhydrAMINE (BENADRYL) capsule 25 mg (25 mg Oral Given 01/27/20 0611)  heparin lock flush 100 unit/mL (500 Units Intracatheter Given 01/27/20 7591)     Note:  This document was prepared using Dragon voice recognition software and may include unintentional dictation errors.  Nanda Quinton, MD, [facility] Emergency Medicine    Major [place], Wonda Old[facility], MD 01/28/20 [phone]

## 2020-01-27 NOTE — ED Notes (Signed)
ED Provider at bedside. 

## 2020-01-27 NOTE — ED Notes (Signed)
Patient to the lobby with a steady gait and her belongings.  Christia [place] to drive the patient home.

## 2020-01-28 ENCOUNTER — Institutional Professional Consult (permissible substitution): Payer: Medicare Other | Admitting: Pulmonary Disease

## 2020-02-16 ENCOUNTER — Ambulatory Visit (INDEPENDENT_AMBULATORY_CARE_PROVIDER_SITE_OTHER): Payer: Medicare Other | Admitting: Nurse Practitioner

## 2020-02-16 ENCOUNTER — Other Ambulatory Visit: Payer: Self-pay

## 2020-02-16 ENCOUNTER — Encounter: Payer: Self-pay | Admitting: Nurse Practitioner

## 2020-02-16 VITALS — BP 102/74 | HR 90 | Temp 98.6°F | Ht 63.0 in | Wt 122.4 lb

## 2020-02-16 DIAGNOSIS — J4520 Mild intermittent asthma, uncomplicated: Secondary | ICD-10-CM | POA: Diagnosis not present

## 2020-02-16 DIAGNOSIS — Z7901 Long term (current) use of anticoagulants: Secondary | ICD-10-CM | POA: Diagnosis not present

## 2020-02-16 DIAGNOSIS — D571 Sickle-cell disease without crisis: Secondary | ICD-10-CM

## 2020-02-16 NOTE — Progress Notes (Signed)
[address] [place], [place]  [address] Phone:  [phone]   Fax:  [phone]   Established Patient Office Visit  Subjective:  Patient ID: Molly Gutierrez, female    DOB: 1981-06-04  Age: 39 y.o. MRN: [id number]  CC:  Chief Complaint  Patient presents with  . Follow-up    HPI [facility] presents for follow up. She  has a past medical history of Asthma, Eczema, History of pulmonary embolus (PE), and Sickle cell anemia ([place]).   She has chest pain and upper back. She is having pain with walking. She admits that this is not new. She is followed by hema She admits that she seen by hematology at [facility].  Denies fever, headache, cough, wheezing, shortness of breath or abdominal pain, . Denies any open wounds, skin irritation.  She denies any other concerns today.   Past Medical History:  Diagnosis Date  . Asthma   . Eczema   . History of pulmonary embolus (PE)   . Sickle cell anemia (H[facility])     Past Surgical History:  Procedure Laterality Date  . CHOLECYSTECTOMY    . ERCP    . JOINT REPLACEMENT    . PORTA CATH INSERTION    . TUBAL LIGATION    . WISDOM TOOTH EXTRACTION      Family History  Problem Relation Age of Onset  . Renal Disease Mother   . Hypertension Mother   . High Cholesterol Mother     Social History   Socioeconomic History  . Marital status: Single    Spouse name: Not on file  . Number of children: Not on file  . Years of education: Not on file  . Highest education level: Not on file  Occupational History  . Not on file  Tobacco Use  . Smoking status: Never Smoker  . Smokeless tobacco: Never Used  Vaping Use  . Vaping Use: Never used  Substance and Sexual Activity  . Alcohol use: Never  . Drug use: Never  . Sexual activity: Not on file  Other Topics Concern  . Not on file  Social History Narrative  . Not on file   Social Determinants of Health   Financial Resource Strain:   . Difficulty  of Paying Living Expenses: Not on file  Food Insecurity:   . Worried About Charity fundraiser in the Last Year: Not on file  . Ran Out of Food in the Last Year: Not on file  Transportation Needs:   . Lack of Transportation (Medical): Not on file  . Lack of Transportation (Non-Medical): Not on file  Physical Activity:   . Days of Exercise per Week: Not on file  . Minutes of Exercise per Session: Not on file  Stress:   . Feeling of Stress : Not on file  Social Connections:   . Frequency of Communication with Friends and Family: Not on file  . Frequency of Social Gatherings with Friends and Family: Not on file  . Attends Religious Services: Not on file  . Active Member of Clubs or Organizations: Not on file  . Attends Archivist Meetings: Not on file  . Marital Status: Not on file  Intimate Partner Violence:   . Fear of Current or Ex-Partner: Not on file  . Emotionally Abused: Not on file  . Physically Abused: Not on file  . Sexually Abused: Not on file    Outpatient Medications Prior to Visit  Medication Sig  Dispense Refill  . albuterol (VENTOLIN HFA) 108 (90 Base) MCG/ACT inhaler Inhale 2 puffs into the lungs 2 (two) times daily as needed for wheezing or shortness of breath.    . celecoxib (CELEBREX) 200 MG capsule Take 200 mg by mouth daily.     . Cholecalciferol (VITAMIN D3) 25 MCG (1000 UT) CAPS Take 1,000 Units by mouth daily.    . Deferasirox (JADENU) 360 MG TABS Take 1,080 mg by mouth daily before breakfast.    . diphenhydrAMINE (BENADRYL) 25 mg capsule Take 25 mg by mouth 3 (three) times daily as needed for itching.    . folic acid (FOLVITE) 1 MG tablet Take 1 mg by mouth daily.    Marland Kitchen gabapentin (NEURONTIN) 300 MG capsule Take 300 mg by mouth 3 (three) times daily.    Marland Kitchen HYDROmorphone (DILAUDID) 4 MG tablet Take 4 mg by mouth every 6 (six) hours as needed for moderate pain or severe pain.     . mirtazapine (REMERON) 15 MG tablet Take 45 mg by mouth at bedtime.       . mometasone-formoterol (DULERA) 100-5 MCG/ACT AERO Inhale 2 puffs into the lungs daily as needed for wheezing or shortness of breath.    Marland Kitchen omeprazole (PRILOSEC) 20 MG capsule Take 20 mg by mouth 2 (two) times a day.    . ondansetron (ZOFRAN) 4 MG tablet Take 1 tablet (4 mg total) by mouth every 8 (eight) hours as needed for nausea or vomiting. 10 tablet 0  . promethazine (PHENERGAN) 25 MG suppository Place 1 suppository (25 mg total) rectally every 6 (six) hours as needed for nausea or vomiting. 12 each 0  . vitamin B-12 (CYANOCOBALAMIN) 1000 MCG tablet Take 1,000 mcg by mouth daily.    Alveda Reasons 20 MG TABS tablet Take 1 tablet (20 mg total) by mouth daily. (Patient taking differently: Take 20 mg by mouth daily with supper. ) 30 tablet 1  . oxyCODONE (OXYCONTIN) 10 mg 12 hr tablet Take 1 tablet (10 mg total) by mouth every 12 (twelve) hours. (Patient not taking: Reported on 02/16/2020) 14 tablet 0  . Oxycodone HCl 10 MG TABS Take 1 tablet (10 mg total) by mouth every 4 (four) hours as needed (pain). Every 4 hours while awake (Patient not taking: Reported on 02/16/2020) 42 tablet 0   No facility-administered medications prior to visit.    Allergies  Allergen Reactions  . Cefaclor Hives and Swelling  . Hydroxyurea Palpitations and Other (See Comments)    Lowers "blood levels" and heart rate (causes HYPOtension); "it messes me up, it drops my levels and stuff"  Other reaction(s): Hypotension, Other (See Comments) "it messes me up, it drops my levels and stuff" Lower blood levels and HR  Other reaction(s): Other (See Comments) Lowers "blood levels" and heart rate (causes HYPOtension); "it messes me up, it drops my levels and stuff"  . Ketamine Palpitations and Other (See Comments)    "Pt states she has had previous reaction to ketamine. States she becomes flushed, heart races, dizzy, and feels like she is going to pass out." Other reaction(s): Other (See Comments), Other (See Comments) "Pt  states she has had previous reaction to ketamine. States she becomes flushed, heart races, dizzy, and feels like she is going to pass out." "Pt states she has had previous reaction to ketamine. States she becomes flushed, heart races, dizzy, and feels like she is going to pass out."    ROS Review of Systems  All other systems reviewed and  are negative.     Objective:    Physical Exam Vitals reviewed.  Constitutional:      General: She is not in acute distress.    Appearance: Normal appearance. She is not ill-appearing, toxic-appearing or diaphoretic.  HENT:     Head: Normocephalic and atraumatic.     Nose: Nose normal.     Mouth/Throat:     Mouth: Mucous membranes are moist.  Cardiovascular:     Rate and Rhythm: Normal rate and regular rhythm.     Pulses: Normal pulses.     Heart sounds: Normal heart sounds.  Pulmonary:     Effort: Pulmonary effort is normal.     Breath sounds: Normal breath sounds.  Abdominal:     Palpations: Abdomen is soft.  Musculoskeletal:        General: Normal range of motion.     Cervical back: Normal range of motion.  Skin:    General: Skin is warm.     Capillary Refill: Capillary refill takes less than 2 seconds.  Neurological:     Mental Status: She is alert and oriented to person, place, and time.  Psychiatric:        Mood and Affect: Mood normal.        Behavior: Behavior normal.        Thought Content: Thought content normal.        Judgment: Judgment normal.     BP 102/74   Pulse 90   Temp 98.6 F (37 C) (Temporal)   Ht 5\' 3"  (1.6 m)   Wt 122 lb 6.4 oz (55.5 kg)   SpO2 98%   BMI 21.68 kg/m  Wt Readings from Last 3 Encounters:  02/16/20 122 lb 6.4 oz (55.5 kg)  01/27/20 127 lb (57.6 kg)  01/07/20 127 lb (57.6 kg)     Health Maintenance Due  Topic Date Due  . Hepatitis C Screening  Never done  . INFLUENZA VACCINE  01/16/2020    There are no preventive care reminders to display for this patient.  No results found for:  TSH Lab Results  Component Value Date   WBC 17.3 (H) 01/27/2020   HGB 9.2 (L) 01/27/2020   HCT 28.8 (L) 01/27/2020   MCV 96.6 01/27/2020   PLT 482 (H) 01/27/2020   Lab Results  Component Value Date   NA 136 01/27/2020   K 3.0 (L) 01/27/2020   CO2 20 (L) 01/27/2020   GLUCOSE 88 01/27/2020   BUN 8 01/27/2020   CREATININE 0.32 (L) 01/27/2020   BILITOT 2.6 (H) 01/27/2020   ALKPHOS 57 01/27/2020   AST 27 01/27/2020   ALT 18 01/27/2020   PROT 6.9 01/27/2020   ALBUMIN 3.9 01/27/2020   CALCIUM 8.0 (L) 01/27/2020   ANIONGAP 9 01/27/2020   No results found for: CHOL No results found for: HDL No results found for: LDLCALC No results found for: TRIG No results found for: CHOLHDL No results found for: HGBA1C    Assessment & Plan:   Problem List Items Addressed This Visit      Respiratory   Asthma Continue with current regimen.  No changes warranted. Good patient compliance.      Other   Sickle-cell anemia ([place]) - Primary She may concern having her care here and following up at [place] [facility] due to the distance    Other Visit Diagnoses    Anticoagulant long-term use     Continue with current regimen.       No  orders of the defined types were placed in this encounter.   Follow-up: Return in about 3 months (around 05/17/2020).    Vevelyn Francois, NP

## 2020-02-20 ENCOUNTER — Encounter: Payer: Self-pay | Admitting: Nurse Practitioner

## 2020-02-27 ENCOUNTER — Other Ambulatory Visit: Payer: Self-pay

## 2020-02-27 ENCOUNTER — Encounter (HOSPITAL_COMMUNITY): Payer: Self-pay

## 2020-02-27 ENCOUNTER — Emergency Department (HOSPITAL_COMMUNITY): Payer: Medicare Other

## 2020-02-27 ENCOUNTER — Emergency Department (HOSPITAL_COMMUNITY)
Admission: EM | Admit: 2020-02-27 | Discharge: 2020-02-28 | Disposition: A | Discharge status: Home / Self Care | Payer: Medicare Other | Source: Home / Self Care | Attending: Emergency Medicine | Admitting: Emergency Medicine

## 2020-02-27 DIAGNOSIS — D72829 Elevated white blood cell count, unspecified: Secondary | ICD-10-CM | POA: Diagnosis not present

## 2020-02-27 DIAGNOSIS — Z7901 Long term (current) use of anticoagulants: Secondary | ICD-10-CM | POA: Insufficient documentation

## 2020-02-27 DIAGNOSIS — G894 Chronic pain syndrome: Secondary | ICD-10-CM | POA: Diagnosis not present

## 2020-02-27 DIAGNOSIS — F1120 Opioid dependence, uncomplicated: Secondary | ICD-10-CM | POA: Diagnosis not present

## 2020-02-27 DIAGNOSIS — J45909 Unspecified asthma, uncomplicated: Secondary | ICD-10-CM | POA: Insufficient documentation

## 2020-02-27 DIAGNOSIS — Z79899 Other long term (current) drug therapy: Secondary | ICD-10-CM | POA: Insufficient documentation

## 2020-02-27 DIAGNOSIS — Z20822 Contact with and (suspected) exposure to covid-19: Secondary | ICD-10-CM | POA: Diagnosis not present

## 2020-02-27 DIAGNOSIS — D5700 Hb-SS disease with crisis, unspecified: Secondary | ICD-10-CM | POA: Insufficient documentation

## 2020-02-27 DIAGNOSIS — Z888 Allergy status to other drugs, medicaments and biological substances status: Secondary | ICD-10-CM | POA: Diagnosis not present

## 2020-02-27 DIAGNOSIS — Z8249 Family history of ischemic heart disease and other diseases of the circulatory system: Secondary | ICD-10-CM | POA: Diagnosis not present

## 2020-02-27 DIAGNOSIS — Z86711 Personal history of pulmonary embolism: Secondary | ICD-10-CM | POA: Insufficient documentation

## 2020-02-27 DIAGNOSIS — R0789 Other chest pain: Secondary | ICD-10-CM | POA: Insufficient documentation

## 2020-02-27 DIAGNOSIS — Z9049 Acquired absence of other specified parts of digestive tract: Secondary | ICD-10-CM | POA: Diagnosis not present

## 2020-02-27 DIAGNOSIS — J4520 Mild intermittent asthma, uncomplicated: Secondary | ICD-10-CM | POA: Diagnosis not present

## 2020-02-27 NOTE — ED Triage Notes (Signed)
Patient arrived with complaints of chest pain related to [facility] since yesterday which she reports is normal for her crisis. States last dose of home medication was last night.

## 2020-02-27 NOTE — ED Notes (Signed)
Pt's veins are unable to be palpated to draw labs. Will need to wait to use pt's chest port.

## 2020-02-28 ENCOUNTER — Encounter (HOSPITAL_COMMUNITY): Payer: Self-pay

## 2020-02-28 ENCOUNTER — Other Ambulatory Visit: Payer: Self-pay

## 2020-02-28 DIAGNOSIS — Z888 Allergy status to other drugs, medicaments and biological substances status: Secondary | ICD-10-CM

## 2020-02-28 DIAGNOSIS — F1120 Opioid dependence, uncomplicated: Secondary | ICD-10-CM | POA: Diagnosis present

## 2020-02-28 DIAGNOSIS — D72829 Elevated white blood cell count, unspecified: Secondary | ICD-10-CM | POA: Diagnosis present

## 2020-02-28 DIAGNOSIS — Z79899 Other long term (current) drug therapy: Secondary | ICD-10-CM

## 2020-02-28 DIAGNOSIS — Z20822 Contact with and (suspected) exposure to covid-19: Secondary | ICD-10-CM | POA: Diagnosis present

## 2020-02-28 DIAGNOSIS — D5700 Hb-SS disease with crisis, unspecified: Principal | ICD-10-CM | POA: Diagnosis present

## 2020-02-28 DIAGNOSIS — Z86711 Personal history of pulmonary embolism: Secondary | ICD-10-CM

## 2020-02-28 DIAGNOSIS — J4520 Mild intermittent asthma, uncomplicated: Secondary | ICD-10-CM | POA: Diagnosis present

## 2020-02-28 DIAGNOSIS — Z8249 Family history of ischemic heart disease and other diseases of the circulatory system: Secondary | ICD-10-CM

## 2020-02-28 DIAGNOSIS — G894 Chronic pain syndrome: Secondary | ICD-10-CM | POA: Diagnosis present

## 2020-02-28 DIAGNOSIS — Z9049 Acquired absence of other specified parts of digestive tract: Secondary | ICD-10-CM

## 2020-02-28 DIAGNOSIS — R0789 Other chest pain: Secondary | ICD-10-CM | POA: Diagnosis present

## 2020-02-28 DIAGNOSIS — Z7901 Long term (current) use of anticoagulants: Secondary | ICD-10-CM

## 2020-02-28 LAB — CBC WITH DIFFERENTIAL/PLATELET
Abs Immature Granulocytes: 0.09 10*3/uL — ABNORMAL HIGH (ref 0.00–0.07)
Basophils Absolute: 0.1 10*3/uL (ref 0.0–0.1)
Basophils Relative: 0 %
Eosinophils Absolute: 0.2 10*3/uL (ref 0.0–0.5)
Eosinophils Relative: 1 %
HCT: 26.1 % — ABNORMAL LOW (ref 36.0–46.0)
Hemoglobin: 9 g/dL — ABNORMAL LOW (ref 12.0–15.0)
Immature Granulocytes: 1 %
Lymphocytes Relative: 24 %
Lymphs Abs: 4 10*3/uL (ref 0.7–4.0)
MCH: 31.5 pg (ref 26.0–34.0)
MCHC: 34.5 g/dL (ref 30.0–36.0)
MCV: 91.3 fL (ref 80.0–100.0)
Monocytes Absolute: 2.5 10*3/uL — ABNORMAL HIGH (ref 0.1–1.0)
Monocytes Relative: 15 %
Neutro Abs: 9.7 10*3/uL — ABNORMAL HIGH (ref 1.7–7.7)
Neutrophils Relative %: 59 %
Platelets: 422 10*3/uL — ABNORMAL HIGH (ref 150–400)
RBC: 2.86 MIL/uL — ABNORMAL LOW (ref 3.87–5.11)
RDW: 18.6 % — ABNORMAL HIGH (ref 11.5–15.5)
WBC: 16.6 10*3/uL — ABNORMAL HIGH (ref 4.0–10.5)
nRBC: 0.4 % — ABNORMAL HIGH (ref 0.0–0.2)

## 2020-02-28 LAB — COMPREHENSIVE METABOLIC PANEL
ALT: 26 U/L (ref 0–44)
AST: 38 U/L (ref 15–41)
Albumin: 4.3 g/dL (ref 3.5–5.0)
Alkaline Phosphatase: 77 U/L (ref 38–126)
Anion gap: 10 (ref 5–15)
BUN: 11 mg/dL (ref 6–20)
CO2: 23 mmol/L (ref 22–32)
Calcium: 9.3 mg/dL (ref 8.9–10.3)
Chloride: 105 mmol/L (ref 98–111)
Creatinine, Ser: 0.43 mg/dL — ABNORMAL LOW (ref 0.44–1.00)
GFR calc Af Amer: >60 mL/min (ref >60)
GFR calc non Af Amer: >60 mL/min (ref >60)
Glucose, Bld: 98 mg/dL (ref 70–99)
Potassium: 4.1 mmol/L (ref 3.5–5.1)
Sodium: 138 mmol/L (ref 135–145)
Total Bilirubin: 3.2 mg/dL — ABNORMAL HIGH (ref 0.3–1.2)
Total Protein: 8.1 g/dL (ref 6.5–8.1)

## 2020-02-28 LAB — TROPONIN I (HIGH SENSITIVITY): Troponin I (High Sensitivity): 3 ng/L (ref <18)

## 2020-02-28 LAB — RETICULOCYTES
Immature Retic Fract: 14.7 % (ref 2.3–15.9)
RBC.: 2.81 MIL/uL — ABNORMAL LOW (ref 3.87–5.11)
Retic Count, Absolute: 574.5 10*3/uL — ABNORMAL HIGH (ref 19.0–186.0)
Retic Ct Pct: 20.9 % — ABNORMAL HIGH (ref 0.4–3.1)

## 2020-02-28 LAB — I-STAT BETA HCG BLOOD, ED (MC, WL, AP ONLY): I-stat hCG, quantitative: <5 m[IU]/mL (ref <5)

## 2020-02-28 MED ORDER — DIPHENHYDRAMINE HCL 25 MG PO CAPS
25.0000 mg | ORAL_CAPSULE | ORAL | Status: DC | PRN
  Administered 2020-02-28: 25 mg via ORAL
  Filled 2020-02-28: qty 1

## 2020-02-28 MED ORDER — PROMETHAZINE HCL 25 MG/ML IJ SOLN
12.5000 mg | Freq: Once | INTRAMUSCULAR | Status: AC
  Administered 2020-02-28: 12.5 mg via INTRAVENOUS
  Filled 2020-02-28: qty 1

## 2020-02-28 MED ORDER — ONDANSETRON HCL 4 MG/2ML IJ SOLN
4.0000 mg | INTRAMUSCULAR | Status: DC | PRN
  Filled 2020-02-28: qty 2

## 2020-02-28 MED ORDER — HYDROMORPHONE HCL 2 MG/ML IJ SOLN
2.0000 mg | Freq: Once | INTRAMUSCULAR | Status: AC
  Administered 2020-02-28: 2 mg via INTRAVENOUS
  Filled 2020-02-28: qty 1

## 2020-02-28 MED ORDER — HYDROMORPHONE HCL 2 MG/ML IJ SOLN
2.0000 mg | INTRAMUSCULAR | Status: AC
  Administered 2020-02-28: 2 mg via INTRAVENOUS
  Filled 2020-02-28: qty 1

## 2020-02-28 MED ORDER — KETOROLAC TROMETHAMINE 15 MG/ML IJ SOLN
15.0000 mg | Freq: Once | INTRAMUSCULAR | Status: AC
  Administered 2020-02-28: 15 mg via INTRAVENOUS
  Filled 2020-02-28: qty 1

## 2020-02-28 MED ORDER — HEPARIN SOD (PORK) LOCK FLUSH 100 UNIT/ML IV SOLN
500.0000 [IU] | Freq: Once | INTRAVENOUS | Status: AC
  Administered 2020-02-28: 500 [IU]
  Filled 2020-02-28: qty 5

## 2020-02-28 MED ORDER — SODIUM CHLORIDE 0.45 % IV SOLN: INTRAVENOUS | Status: DC

## 2020-02-28 NOTE — ED Provider Notes (Signed)
[facility] DEPT Provider Note   CSN: [id number] Arrival date & time: 02/27/20  2042     History Chief Complaint  Patient presents with  . Sickle Cell Pain Crisis    Molly Gutierrez is a 39 y.o. female.  The history is provided by the patient and medical records.  Sickle Cell Pain Crisis   39 year old female with history of asthma, history of PE on Xarelto, sickle cell anemia presenting to the ED with pain crisis.  States she had intermittent pain all last week, pain became more severe yesterday.  States over the past week she has been responding intermittently to her pain medication, however yesterday she did not take her pain medication during the day as she was caring for her children and she thinks that is what set her over the edge.  She does have some pain in her left chest wall which is typical for her during crises.  She denies any shortness of breath, cough, fever, or hemoptysis.  She does report some difficulty with increased pain with weather change.  States he has not had any meds since last evening.  Past Medical History:  Diagnosis Date  . Asthma   . Eczema   . History of pulmonary embolus (PE)   . Sickle cell anemia [facility])     Patient Active Problem List   Diagnosis Date Noted  . Sickle cell anemia with crisis ([place]) 01/07/2020  . Sickle cell crisis acute chest syndrome ([place]) 07/29/2019  . Drug-seeking behavior 07/07/2019  . Sinus tachycardia 07/07/2019  . Therapeutic opioid-induced constipation (OIC) 07/07/2019  . Breast mass in female 06/29/2019  . Atelectasis of right lung 06/29/2019  . Sickle cell crisis ([place]) 06/27/2019  . Sickle cell pain crisis ([place]) 04/19/2019  . Pneumonia 03/27/2019  . 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 Anterior chest wall pain 11/13/2018  . Epigastric abdominal pain 11/13/2018  . Hematuria 11/13/2018  . Hyperbilirubinemia 11/12/2018  . Long term current use of anticoagulant therapy 11/12/2018  .  History of pulmonary embolism 09/26/2018  . Acute pulmonary embolism ([place]) 09/26/2018  . Opioid dependence ([place]) 09/13/2018  . Port-A-Cath in place 09/13/2018  . Narcotic abuse, continuous ([place]) 08/28/2018  . Moderate persistent asthma without complication 71/11/2692  . Anemia 07/03/2018  . Personal history of other venous thrombosis and embolism 07/03/2018  . History of transfusion 07/03/2018  . Gastro-esophageal reflux disease without esophagitis 06/02/2018  . Asthma 05/19/2018  . Anxiety and depression 10/30/2017  . At risk for sepsis 06/13/2017  . Mitral regurgitation 02/01/2014  . Itching 12/13/2013  . Nausea & vomiting 12/13/2013  . 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 Frequent complaints of pain 11/01/2013  . Hypokalemia 09/19/2013  . S/P total hip arthroplasty 05/19/2013  . 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 Lower urinary tract infectious disease 03/02/2013  . Chest pain 11/14/2012  . Sickle-cell anemia ([place]) 10/30/2012  . Pain management 08/01/2012  . Reticulocytosis 07/31/2012  . 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 Leukocytosis 03/27/2012  . History of gestational diabetes 10/07/2011  . 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 CHOLECYSTECTOMY    . ERCP    . JOINT REPLACEMENT    . PORTA CATH INSERTION    . TUBAL LIGATION    . WISDOM TOOTH EXTRACTION  OB History   No obstetric history on file.     Family History  Problem Relation Age of Onset  . Renal Disease Mother   . Hypertension Mother   . High Cholesterol Mother     Social History   Tobacco Use  . Smoking status: Never Smoker  . Smokeless tobacco: Never Used  Vaping Use  . Vaping Use: Never used  Substance Use Topics  . Alcohol use: Never  . Drug  use: Never    Home Medications Prior to Admission medications   Medication Sig Start Date End Date Taking? Authorizing Provider  albuterol (VENTOLIN HFA) 108 (90 Base) MCG/ACT inhaler Inhale 2 puffs into the lungs 2 (two) times daily as needed for wheezing or shortness of breath. 12/31/16   [provider]  celecoxib (CELEBREX) 200 MG capsule Take 200 mg by mouth daily.  10/14/19   [provider]  Cholecalciferol (VITAMIN D3) 25 MCG (1000 UT) CAPS Take 1,000 Units by mouth daily.    [provider]  Deferasirox (JADENU) 360 MG TABS Take 1,080 mg by mouth daily before breakfast. 08/08/19 08/07/20  [provider]  diphenhydrAMINE (BENADRYL) 25 mg capsule Take 25 mg by mouth 3 (three) times daily as needed for itching.    [provider]  folic acid (FOLVITE) 1 MG tablet Take 1 mg by mouth daily. 12/31/16   [provider]  gabapentin (NEURONTIN) 300 MG capsule Take 300 mg by mouth 3 (three) times daily. 10/13/19   [provider]  HYDROmorphone (DILAUDID) 4 MG tablet Take 4 mg by mouth every 6 (six) hours as needed for moderate pain or severe pain.  08/20/19   [provider]  mirtazapine (REMERON) 15 MG tablet Take 45 mg by mouth at bedtime.  08/08/19   [provider]  mometasone-formoterol (DULERA) 100-5 MCG/ACT AERO Inhale 2 puffs into the lungs daily as needed for wheezing or shortness of breath.    [provider]  omeprazole (PRILOSEC) 20 MG capsule Take 20 mg by mouth 2 (two) times a day. 09/08/18   [provider]  ondansetron (ZOFRAN) 4 MG tablet Take 1 tablet (4 mg total) by mouth every 8 (eight) hours as needed for nausea or vomiting. 09/15/19   Rolland Porter, MD  promethazine (PHENERGAN) 25 MG suppository Place 1 suppository (25 mg total) rectally every 6 (six) hours as needed for nausea or vomiting. 08/14/19   Tedd Sias, PA  vitamin B-12 (CYANOCOBALAMIN) 1000 MCG tablet Take 1,000 mcg by mouth  daily.    [provider]  XARELTO 20 MG TABS tablet Take 1 tablet (20 mg total) by mouth daily. Patient taking differently: Take 20 mg by mouth daily with supper.  05/14/19   [name], MD    Allergies    Cefaclor, Hydroxyurea, and Ketamine  Review of Systems   Review of Systems  Musculoskeletal: Positive for arthralgias and myalgias.  All other systems reviewed and are negative.   Physical Exam Updated Vital Signs BP 107/70   Pulse 71   Temp 99.1 F (37.3 C) (Oral)   Resp 18   LMP 02/08/2020   SpO2 99%   Physical Exam Vitals and nursing note reviewed.  Constitutional:      Appearance: She is well-developed.  HENT:     Head: Normocephalic and atraumatic.  Eyes:     Conjunctiva/sclera: Conjunctivae normal.     Pupils: Pupils are equal, round, and reactive to light.  Cardiovascular:  Rate and Rhythm: Normal rate and regular rhythm.     Heart sounds: Normal heart sounds.  Pulmonary:     Effort: Pulmonary effort is normal. No respiratory distress.     Breath sounds: Normal breath sounds. No rhonchi.  Chest:     Comments: Port accessed right chest wall, appears clean without signs of infection Abdominal:     General: Bowel sounds are normal.     Palpations: Abdomen is soft.     Tenderness: There is no abdominal tenderness. There is no rebound.  Musculoskeletal:        General: Normal range of motion.     Cervical back: Normal range of motion.  Skin:    General: Skin is warm and dry.  Neurological:     Mental Status: She is alert and oriented to person, place, and time.     ED Results / Procedures / Treatments   Labs (all labs ordered are listed, but only abnormal results are displayed) Labs Reviewed  COMPREHENSIVE METABOLIC PANEL - Abnormal; Notable for the following components:      Result Value   Creatinine, Ser 0.43 (*)    Total Bilirubin 3.2 (*)    All other components within normal limits  CBC WITH DIFFERENTIAL/PLATELET -  Abnormal; Notable for the following components:   WBC 16.6 (*)    RBC 2.86 (*)    Hemoglobin 9.0 (*)    HCT 26.1 (*)    RDW 18.6 (*)    Platelets 422 (*)    nRBC 0.4 (*)    Neutro Abs 9.7 (*)    Monocytes Absolute 2.5 (*)    Abs Immature Granulocytes 0.09 (*)    All other components within normal limits  RETICULOCYTES - Abnormal; Notable for the following components:   Retic Ct Pct 20.9 (*)    RBC. 2.81 (*)    Retic Count, Absolute 574.5 (*)    All other components within normal limits  I-STAT BETA HCG BLOOD, ED ([facility], [facility], [facility] ONLY)  TROPONIN I (HIGH SENSITIVITY)    EKG None  Radiology DG Chest 2 View  Result Date: 02/27/2020 CLINICAL DATA:  Chest pain. EXAM: CHEST - 2 VIEW COMPARISON:  January 26, 2020 FINDINGS: Stable bilateral venous Port-A-Cath positioning is seen. There is no evidence of acute infiltrate, pleural effusion or pneumothorax. The heart size and mediastinal contours are within normal limits. Radiopaque surgical clips are seen overlying the right upper quadrant. The visualized skeletal structures are unremarkable. IMPRESSION: No active cardiopulmonary disease. Electronically Signed   By: Virgina Norfolk M.D.   On: 02/27/2020 21:29    Procedures Procedures (including critical care time)  CRITICAL CARE Performed by: Larene Pickett   Total critical care time: 40 minutes  Critical care time was exclusive of separately billable procedures and treating other patients.  Critical care was necessary to treat or prevent imminent or life-threatening deterioration.  Critical care was time spent personally by me on the following activities: development of treatment plan with patient and/or surrogate as well as nursing, discussions with consultants, evaluation of patient's response to treatment, examination of patient, obtaining history from patient or surrogate, ordering and performing treatments and interventions, ordering and review of laboratory studies, ordering and  review of radiographic studies, pulse oximetry and re-evaluation of patient's condition.   Medications Ordered in ED Medications  0.45 % sodium chloride infusion (has no administration in time range)  HYDROmorphone (DILAUDID) injection 2 mg (has no administration in time range)  HYDROmorphone (DILAUDID) injection 2  mg (has no administration in time range)  diphenhydrAMINE (BENADRYL) capsule 25 mg (has no administration in time range)  ondansetron (ZOFRAN) injection 4 mg (has no administration in time range)    ED Course  I have reviewed the triage vital signs and the nursing notes.  Pertinent labs & imaging results that were available during my care of the patient were reviewed by me and considered in my medical decision making (see chart for details).    MDM Rules/Calculators/A&P  39 year old female presenting to the ED with sickle cell pain crisis.  She does report chest pain which is typical for her during crises.  Denies any cough, fever, hemoptysis, or shortness of breath.  She has been compliant with her xarelto.  She is afebrile and nontoxic.  Lungs are clear without any noted wheezes or rhonchi.  Labs and chest x-ray are pending.  Will initiate IV fluids and pain control.  Labs are reassuring.  Hemoglobin is stable.  Chest x-ray is clear.  No signs or symptoms concerning for acute chest.  We will continue pain control regimen and reassess.  3:22 AM Pain somewhat improved.  Still uncomfortable.  Labs all reassuring.  Will give third dose of meds and toradol.  If no improvement, consider admission for pain control.  4:45 AM After third dose of meds feeling better.  Pain 7/10 but tolerable per patient.  She would like to try to go home so she can get her kids to school.  She will follow-up at sickle cell clinic later today if pain becomes uncontrolled again. Continue home meds.  Close PCP follow-up.  Return here for any new/acute changes.  Final Clinical Impression(s) / ED  Diagnoses Final diagnoses:  Sickle cell pain crisis [facility])    Rx / DC Orders ED Discharge Orders    None       Larene Pickett, PA-C 02/28/20 0500    [place], Jenny Reichmann, MD 02/28/20 [phone]

## 2020-02-28 NOTE — Discharge Instructions (Signed)
Continue your home pain medications. Follow-up at sickle cell clinic if pain becomes uncontrolled again. Return here for new concerns.

## 2020-02-28 NOTE — ED Triage Notes (Signed)
Pt reports sternal chest pain that is related to her sickle cell crisis pain. Pr seen for same last night and thought she was better to go home. Took rx home hydromorphone and pain is still uncontrolled. Requests blood be obtained after port accessed.

## 2020-02-29 ENCOUNTER — Other Ambulatory Visit: Payer: Self-pay

## 2020-02-29 ENCOUNTER — Encounter (HOSPITAL_COMMUNITY): Payer: Self-pay | Admitting: Family Medicine

## 2020-02-29 ENCOUNTER — Inpatient Hospital Stay (HOSPITAL_COMMUNITY)
Admission: EM | Admit: 2020-02-29 | Discharge: 2020-03-05 | DRG: 812 | Disposition: A | Discharge status: Home / Self Care | Payer: Medicare Other | Attending: Internal Medicine | Admitting: Internal Medicine

## 2020-02-29 ENCOUNTER — Emergency Department (HOSPITAL_COMMUNITY): Payer: Medicare Other

## 2020-02-29 DIAGNOSIS — J45909 Unspecified asthma, uncomplicated: Secondary | ICD-10-CM | POA: Diagnosis present

## 2020-02-29 DIAGNOSIS — Z20822 Contact with and (suspected) exposure to covid-19: Secondary | ICD-10-CM | POA: Diagnosis present

## 2020-02-29 DIAGNOSIS — Z8249 Family history of ischemic heart disease and other diseases of the circulatory system: Secondary | ICD-10-CM | POA: Diagnosis not present

## 2020-02-29 DIAGNOSIS — D72829 Elevated white blood cell count, unspecified: Secondary | ICD-10-CM | POA: Diagnosis present

## 2020-02-29 DIAGNOSIS — F1120 Opioid dependence, uncomplicated: Secondary | ICD-10-CM | POA: Diagnosis present

## 2020-02-29 DIAGNOSIS — Z86711 Personal history of pulmonary embolism: Secondary | ICD-10-CM | POA: Diagnosis not present

## 2020-02-29 DIAGNOSIS — D649 Anemia, unspecified: Secondary | ICD-10-CM | POA: Diagnosis not present

## 2020-02-29 DIAGNOSIS — Z79899 Other long term (current) drug therapy: Secondary | ICD-10-CM | POA: Diagnosis not present

## 2020-02-29 DIAGNOSIS — Z7901 Long term (current) use of anticoagulants: Secondary | ICD-10-CM | POA: Diagnosis not present

## 2020-02-29 DIAGNOSIS — Z888 Allergy status to other drugs, medicaments and biological substances status: Secondary | ICD-10-CM | POA: Diagnosis not present

## 2020-02-29 DIAGNOSIS — R0789 Other chest pain: Secondary | ICD-10-CM | POA: Diagnosis present

## 2020-02-29 DIAGNOSIS — D5700 Hb-SS disease with crisis, unspecified: Secondary | ICD-10-CM | POA: Diagnosis present

## 2020-02-29 DIAGNOSIS — G894 Chronic pain syndrome: Secondary | ICD-10-CM | POA: Diagnosis present

## 2020-02-29 DIAGNOSIS — Z9049 Acquired absence of other specified parts of digestive tract: Secondary | ICD-10-CM | POA: Diagnosis not present

## 2020-02-29 DIAGNOSIS — J4520 Mild intermittent asthma, uncomplicated: Secondary | ICD-10-CM

## 2020-02-29 LAB — CBC WITH DIFFERENTIAL/PLATELET
Abs Immature Granulocytes: 0.1 10*3/uL — ABNORMAL HIGH (ref 0.00–0.07)
Basophils Absolute: 0.1 10*3/uL (ref 0.0–0.1)
Basophils Relative: 0 %
Eosinophils Absolute: 0.3 10*3/uL (ref 0.0–0.5)
Eosinophils Relative: 2 %
HCT: 25.5 % — ABNORMAL LOW (ref 36.0–46.0)
Hemoglobin: 8.8 g/dL — ABNORMAL LOW (ref 12.0–15.0)
Immature Granulocytes: 1 %
Lymphocytes Relative: 20 %
Lymphs Abs: 3.1 10*3/uL (ref 0.7–4.0)
MCH: 31 pg (ref 26.0–34.0)
MCHC: 34.5 g/dL (ref 30.0–36.0)
MCV: 89.8 fL (ref 80.0–100.0)
Monocytes Absolute: 2.7 10*3/uL — ABNORMAL HIGH (ref 0.1–1.0)
Monocytes Relative: 17 %
Neutro Abs: 9.8 10*3/uL — ABNORMAL HIGH (ref 1.7–7.7)
Neutrophils Relative %: 60 %
Platelets: 466 10*3/uL — ABNORMAL HIGH (ref 150–400)
RBC: 2.84 MIL/uL — ABNORMAL LOW (ref 3.87–5.11)
RDW: 18.3 % — ABNORMAL HIGH (ref 11.5–15.5)
WBC: 16 10*3/uL — ABNORMAL HIGH (ref 4.0–10.5)
nRBC: 0.3 % — ABNORMAL HIGH (ref 0.0–0.2)

## 2020-02-29 LAB — RETICULOCYTES
Immature Retic Fract: 14 % (ref 2.3–15.9)
RBC.: 2.8 MIL/uL — ABNORMAL LOW (ref 3.87–5.11)
Retic Count, Absolute: 579.5 10*3/uL — ABNORMAL HIGH (ref 19.0–186.0)
Retic Ct Pct: 20.3 % — ABNORMAL HIGH (ref 0.4–3.1)

## 2020-02-29 LAB — COMPREHENSIVE METABOLIC PANEL
ALT: 22 U/L (ref 0–44)
AST: 36 U/L (ref 15–41)
Albumin: 4.4 g/dL (ref 3.5–5.0)
Alkaline Phosphatase: 68 U/L (ref 38–126)
Anion gap: 13 (ref 5–15)
BUN: 10 mg/dL (ref 6–20)
CO2: 21 mmol/L — ABNORMAL LOW (ref 22–32)
Calcium: 9 mg/dL (ref 8.9–10.3)
Chloride: 102 mmol/L (ref 98–111)
Creatinine, Ser: 0.44 mg/dL (ref 0.44–1.00)
GFR calc Af Amer: >60 mL/min (ref >60)
GFR calc non Af Amer: >60 mL/min (ref >60)
Glucose, Bld: 97 mg/dL (ref 70–99)
Potassium: 3.8 mmol/L (ref 3.5–5.1)
Sodium: 136 mmol/L (ref 135–145)
Total Bilirubin: 3.7 mg/dL — ABNORMAL HIGH (ref 0.3–1.2)
Total Protein: 8.1 g/dL (ref 6.5–8.1)

## 2020-02-29 LAB — SARS CORONAVIRUS 2 BY RT PCR (HOSPITAL ORDER, PERFORMED IN ~~LOC~~ HOSPITAL LAB): SARS Coronavirus 2: NEGATIVE

## 2020-02-29 LAB — TROPONIN I (HIGH SENSITIVITY): Troponin I (High Sensitivity): 3 ng/L (ref <18)

## 2020-02-29 LAB — PREGNANCY, URINE: Preg Test, Ur: NEGATIVE

## 2020-02-29 MED ORDER — KETOROLAC TROMETHAMINE 15 MG/ML IJ SOLN
15.0000 mg | Freq: Four times a day (QID) | INTRAMUSCULAR | Status: AC
  Administered 2020-02-29 – 2020-03-05 (×19): 15 mg via INTRAVENOUS
  Filled 2020-02-29 (×20): qty 1

## 2020-02-29 MED ORDER — DIPHENHYDRAMINE HCL 25 MG PO CAPS
25.0000 mg | ORAL_CAPSULE | ORAL | Status: DC | PRN
  Administered 2020-02-29: 25 mg via ORAL
  Filled 2020-02-29 (×2): qty 1

## 2020-02-29 MED ORDER — FOLIC ACID 1 MG PO TABS
1.0000 mg | ORAL_TABLET | Freq: Every day | ORAL | Status: DC
  Administered 2020-02-29 – 2020-03-05 (×6): 1 mg via ORAL
  Filled 2020-02-29 (×6): qty 1

## 2020-02-29 MED ORDER — ONDANSETRON HCL 4 MG/2ML IJ SOLN: 4.0000 mg | Freq: Four times a day (QID) | INTRAMUSCULAR | Status: DC | PRN

## 2020-02-29 MED ORDER — RIVAROXABAN 20 MG PO TABS
20.0000 mg | ORAL_TABLET | Freq: Every day | ORAL | Status: DC
  Administered 2020-02-29 – 2020-03-05 (×6): 20 mg via ORAL
  Filled 2020-02-29 (×7): qty 1

## 2020-02-29 MED ORDER — CHLORHEXIDINE GLUCONATE CLOTH 2 % EX PADS
6.0000 | MEDICATED_PAD | Freq: Every day | CUTANEOUS | Status: DC
  Administered 2020-02-29 – 2020-03-03 (×3): 6 via TOPICAL

## 2020-02-29 MED ORDER — HYDROMORPHONE HCL 2 MG/ML IJ SOLN
2.0000 mg | INTRAMUSCULAR | Status: DC | PRN
  Administered 2020-02-29: 2 mg via INTRAVENOUS
  Filled 2020-02-29: qty 1

## 2020-02-29 MED ORDER — SODIUM CHLORIDE 0.9 % IV SOLN
25.0000 mg | INTRAVENOUS | Status: DC | PRN
  Filled 2020-02-29: qty 0.5

## 2020-02-29 MED ORDER — DIPHENHYDRAMINE HCL 25 MG PO CAPS
ORAL_CAPSULE | ORAL | Status: AC
  Filled 2020-02-29: qty 1

## 2020-02-29 MED ORDER — MOMETASONE FURO-FORMOTEROL FUM 100-5 MCG/ACT IN AERO
2.0000 | INHALATION_SPRAY | Freq: Two times a day (BID) | RESPIRATORY_TRACT | Status: DC
  Administered 2020-02-29 – 2020-03-04 (×7): 2 via RESPIRATORY_TRACT
  Filled 2020-02-29: qty 8.8

## 2020-02-29 MED ORDER — GABAPENTIN 300 MG PO CAPS
300.0000 mg | ORAL_CAPSULE | Freq: Three times a day (TID) | ORAL | Status: DC
  Administered 2020-02-29 – 2020-03-05 (×16): 300 mg via ORAL
  Filled 2020-02-29 (×16): qty 1

## 2020-02-29 MED ORDER — HYDROMORPHONE HCL 2 MG/ML IJ SOLN
2.0000 mg | Freq: Once | INTRAMUSCULAR | Status: AC
  Administered 2020-02-29: 2 mg via INTRAVENOUS
  Filled 2020-02-29: qty 1

## 2020-02-29 MED ORDER — PANTOPRAZOLE SODIUM 40 MG PO TBEC
40.0000 mg | DELAYED_RELEASE_TABLET | Freq: Every day | ORAL | Status: DC
  Administered 2020-02-29 – 2020-03-05 (×6): 40 mg via ORAL
  Filled 2020-02-29 (×6): qty 1

## 2020-02-29 MED ORDER — KETOROLAC TROMETHAMINE 15 MG/ML IJ SOLN
15.0000 mg | Freq: Once | INTRAMUSCULAR | Status: AC
  Administered 2020-02-29: 15 mg via INTRAVENOUS
  Filled 2020-02-29: qty 1

## 2020-02-29 MED ORDER — DIPHENHYDRAMINE HCL 50 MG/ML IJ SOLN
12.5000 mg | Freq: Once | INTRAMUSCULAR | Status: AC
  Administered 2020-02-29: 12.5 mg via INTRAVENOUS
  Filled 2020-02-29: qty 1

## 2020-02-29 MED ORDER — MIRTAZAPINE 30 MG PO TABS
45.0000 mg | ORAL_TABLET | Freq: Every day | ORAL | Status: DC
  Administered 2020-02-29 – 2020-03-04 (×5): 45 mg via ORAL
  Filled 2020-02-29 (×5): qty 1

## 2020-02-29 MED ORDER — HYDROMORPHONE 1 MG/ML IV SOLN
INTRAVENOUS | Status: DC
  Administered 2020-02-29: 30 mg via INTRAVENOUS
  Administered 2020-02-29: 4.5 mg via INTRAVENOUS
  Administered 2020-03-01: 3.5 mg via INTRAVENOUS
  Administered 2020-03-01: 0 mg via INTRAVENOUS
  Filled 2020-02-29: qty 30

## 2020-02-29 MED ORDER — SENNOSIDES-DOCUSATE SODIUM 8.6-50 MG PO TABS
1.0000 | ORAL_TABLET | Freq: Two times a day (BID) | ORAL | Status: DC
  Administered 2020-02-29 – 2020-03-05 (×10): 1 via ORAL
  Filled 2020-02-29 (×10): qty 1

## 2020-02-29 MED ORDER — DEFERASIROX 360 MG PO TABS: 1080.0000 mg | ORAL_TABLET | Freq: Every day | ORAL | Status: DC

## 2020-02-29 MED ORDER — SODIUM CHLORIDE 0.9% FLUSH: 9.0000 mL | INTRAVENOUS | Status: DC | PRN

## 2020-02-29 MED ORDER — SODIUM CHLORIDE 0.9 % IV BOLUS
500.0000 mL | Freq: Once | INTRAVENOUS | Status: AC
  Administered 2020-02-29: 500 mL via INTRAVENOUS

## 2020-02-29 MED ORDER — ALBUTEROL SULFATE (2.5 MG/3ML) 0.083% IN NEBU: 2.5000 mg | INHALATION_SOLUTION | Freq: Two times a day (BID) | RESPIRATORY_TRACT | Status: DC | PRN

## 2020-02-29 MED ORDER — POLYETHYLENE GLYCOL 3350 17 G PO PACK: 17.0000 g | PACK | Freq: Every day | ORAL | Status: DC | PRN

## 2020-02-29 MED ORDER — NALOXONE HCL 0.4 MG/ML IJ SOLN: 0.4000 mg | INTRAMUSCULAR | Status: DC | PRN

## 2020-02-29 NOTE — ED Provider Notes (Signed)
[facility] DEPT Provider Note   CSN: [id number] Arrival date & time: 02/28/20  2157     History Chief Complaint  Patient presents with  . Sickle Cell Pain Crisis    Molly Gutierrez is a 39 y.o. 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  Patient presents today for what she describes as a typical sickle cell pain crisis. Patient reports onset of current sickle cell crisis 1 week ago, has been constant since onset describes a throbbing aching pain throughout her body, severe, constant, nonradiating no aggravating factors minimally improved with home hydromorphone. She reports pain to her chest as well which is normal for her sickle cell pain crises. She went to the emergency department yesterday for the same, pain somewhat improved and she went home. After taking care of her children her pain worsened and she returned to the emergency department for reevaluation.  Denies fever/chills, headache, vision changes, neck stiffness, cough/hemoptysis, shortness of breath, abdominal pain, nausea/vomiting, extremity swelling/color change, atypical pain or any additional concerns.  HPI     Past Medical History:  Diagnosis Date  . Asthma   . Eczema   . History of pulmonary embolus (PE)   . Sickle cell anemia [facility])     Patient Active Problem List   Diagnosis Date Noted  . Sickle cell anemia with crisis ([place]) 01/07/2020  . Sickle cell crisis acute chest syndrome ([place]) 07/29/2019  . Drug-seeking behavior 07/07/2019  . Sinus tachycardia 07/07/2019  . Therapeutic opioid-induced constipation ([facility]) 07/07/2019  . Breast mass in female 06/29/2019  . Atelectasis of right lung 06/29/2019  . Sickle cell crisis ([place]) 06/27/2019  . Sickle cell pain crisis ([place]) 04/19/2019  . Pneumonia 03/27/2019  . 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 Anterior chest wall pain 11/13/2018  .  Epigastric abdominal pain 11/13/2018  . Hematuria 11/13/2018  . Hyperbilirubinemia 11/12/2018  . Long term current use of anticoagulant therapy 11/12/2018  . History of pulmonary embolism 09/26/2018  . Acute pulmonary embolism ([place]) 09/26/2018  . Opioid dependence ([place]) 09/13/2018  . Port-A-Cath in place 09/13/2018  . Narcotic abuse, continuous ([place]) 08/28/2018  . Moderate persistent asthma without complication 89/37/3428  . Anemia 07/03/2018  . Personal history of other venous thrombosis and embolism 07/03/2018  . History of transfusion 07/03/2018  . Gastro-esophageal reflux disease without esophagitis 06/02/2018  . Asthma 05/19/2018  . Anxiety and depression 10/30/2017  . At risk for sepsis 06/13/2017  . Mitral regurgitation 02/01/2014  . Itching 12/13/2013  . Nausea & vomiting 12/13/2013  . 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 Frequent complaints of pain 11/01/2013  . Hypokalemia 09/19/2013  . S/P total hip arthroplasty 05/19/2013  . 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 Lower urinary tract infectious disease 03/02/2013  . Chest pain 11/14/2012  . Sickle-cell anemia ([place]) 10/30/2012  . Pain management 08/01/2012  . Reticulocytosis 07/31/2012  . 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 Essential (hemorrhagic) thrombocythemia ([place]) 04/03/2012  . Leukocytosis 03/27/2012  . History of gestational diabetes 10/07/2011  . Hb-SS disease with crisis, unspecified ([place]) 06/25/2010    Past Surgical History:  Procedure Laterality Date  . CHOLECYSTECTOMY    . ERCP    . JOINT REPLACEMENT    . PORTA CATH INSERTION    . TUBAL LIGATION    . WISDOM TOOTH EXTRACTION       OB  History   No obstetric history on file.     Family History  Problem Relation Age of Onset  . Renal Disease Mother   . Hypertension Mother   . High Cholesterol Mother     Social History   Tobacco Use   . Smoking status: Never Smoker  . Smokeless tobacco: Never Used  Vaping Use  . Vaping Use: Never used  Substance Use Topics  . Alcohol use: Never  . Drug use: Never    Home Medications Prior to Admission medications   Medication Sig Start Date End Date Taking? Authorizing Provider  albuterol (VENTOLIN HFA) 108 (90 Base) MCG/ACT inhaler Inhale 2 puffs into the lungs 2 (two) times daily as needed for wheezing or shortness of breath. 12/31/16   [provider]  celecoxib (CELEBREX) 200 MG capsule Take 200 mg by mouth daily.  10/14/19   [provider]  Cholecalciferol (VITAMIN D3) 25 MCG (1000 UT) CAPS Take 1,000 Units by mouth daily.    [provider]  Deferasirox (JADENU) 360 MG TABS Take 1,080 mg by mouth daily before breakfast. 08/08/19 08/07/20  [provider]  diphenhydrAMINE (BENADRYL) 25 mg capsule Take 25 mg by mouth 3 (three) times daily as needed for itching.    [provider]  folic acid (FOLVITE) 1 MG tablet Take 1 mg by mouth daily. 12/31/16   [provider]  gabapentin (NEURONTIN) 300 MG capsule Take 300 mg by mouth 3 (three) times daily. 10/13/19   [provider]  HYDROmorphone (DILAUDID) 4 MG tablet Take 4 mg by mouth every 6 (six) hours as needed for moderate pain or severe pain.  08/20/19   [provider]  mirtazapine (REMERON) 15 MG tablet Take 45 mg by mouth at bedtime.  08/08/19   [provider]  mometasone-formoterol (DULERA) 100-5 MCG/ACT AERO Inhale 2 puffs into the lungs daily as needed for wheezing or shortness of breath.    [provider]  omeprazole (PRILOSEC) 20 MG capsule Take 20 mg by mouth 2 (two) times a day. 09/08/18   [provider]  ondansetron (ZOFRAN) 4 MG tablet Take 1 tablet (4 mg total) by mouth every 8 (eight) hours as needed for nausea or vomiting. 09/15/19   Rolland Porter, MD  promethazine (PHENERGAN) 25 MG suppository Place 1 suppository (25 mg total)  rectally every 6 (six) hours as needed for nausea or vomiting. 08/14/19   [place], Kathleene Hazel, PA  PROMETHAZINE HCL PO Take 1 tablet by mouth every 6 (six) hours as needed.    [provider]  vitamin B-12 (CYANOCOBALAMIN) 1000 MCG tablet Take 1,000 mcg by mouth daily.    [provider]  XARELTO 20 MG TABS tablet Take 1 tablet (20 mg total) by mouth daily. Patient taking differently: Take 20 mg by mouth daily with supper.  05/14/19   [name], MD    Allergies    Cefaclor, Hydroxyurea, and Ketamine  Review of Systems   Review of Systems Ten systems are reviewed and are negative for acute change except as noted in the HPI  Physical Exam Updated Vital Signs BP 122/71   Pulse 100   Temp 99.3 F (37.4 C) (Oral)   Resp (!) 21   LMP 02/08/2020   SpO2 96%   Physical Exam Constitutional:      General: She is not in acute distress.    Appearance: Normal appearance. She is well-developed. She is not ill-appearing or diaphoretic.  HENT:  Head: Normocephalic and atraumatic.     Right Ear: External ear normal.     Left Ear: External ear normal.  Eyes:     General: Vision grossly intact. Gaze aligned appropriately.     Pupils: Pupils are equal, round, and reactive to light.  Neck:     Trachea: Trachea and phonation normal.  Cardiovascular:     Rate and Rhythm: Normal rate and regular rhythm.  Pulmonary:     Effort: Pulmonary effort is normal. No respiratory distress.     Breath sounds: Normal breath sounds.  Abdominal:     General: There is no distension.     Palpations: Abdomen is soft.     Tenderness: There is no abdominal tenderness. There is no guarding or rebound.  Musculoskeletal:        General: Normal range of motion.     Cervical back: Normal range of motion.     Right lower leg: No edema.     Left lower leg: No edema.  Skin:    General: Skin is warm and dry.  Neurological:     Mental Status: She is alert.     GCS: GCS eye subscore is 4.  GCS verbal subscore is 5. GCS motor subscore is 6.     Comments: Speech is clear and goal oriented, follows commands Major Cranial nerves without deficit, no facial droop Moves extremities without ataxia, coordination intact  Psychiatric:        Behavior: Behavior normal.     ED Results / Procedures / Treatments   Labs (all labs ordered are listed, but only abnormal results are displayed) Labs Reviewed  COMPREHENSIVE METABOLIC PANEL - Abnormal; Notable for the following components:      Result Value   CO2 21 (*)    Total Bilirubin 3.7 (*)    All other components within normal limits  CBC WITH DIFFERENTIAL/PLATELET - Abnormal; Notable for the following components:   WBC 16.0 (*)    RBC 2.84 (*)    Hemoglobin 8.8 (*)    HCT 25.5 (*)    RDW 18.3 (*)    Platelets 466 (*)    nRBC 0.3 (*)    Neutro Abs 9.8 (*)    Monocytes Absolute 2.7 (*)    Abs Immature Granulocytes 0.10 (*)    All other components within normal limits  RETICULOCYTES - Abnormal; Notable for the following components:   Retic Ct Pct 20.3 (*)    RBC. 2.80 (*)    Retic Count, Absolute 579.5 (*)    All other components within normal limits  PREGNANCY, URINE  TROPONIN I (HIGH SENSITIVITY)    EKG EKG Interpretation  Date/Time:  Monday February 28 2020 22:31:25 EDT Ventricular Rate:  104 PR Interval:    QRS Duration: 83 QT Interval:  359 QTC Calculation: 468 R Axis:   42 Text Interpretation: Sinus tachycardia Biatrial enlargement Nonspecific T abnormalities, anterior leads 12 Lead; Mason-Likar No significant change since prior yesterday Confirmed by Aletta Edouard [phone]) on 02/29/2020 7:03:50 AM   Radiology DG Chest 2 View  Result Date: 02/29/2020 CLINICAL DATA:  Chest pain. Sickle cell crisis. EXAM: CHEST - 2 VIEW COMPARISON:  Chest x-ray 02/27/2020 FINDINGS: Bilateral Port-A-Cath. In good position, unchanged. The cardiac silhouette, mediastinal and hilar contours are within normal limits and stable. No  acute pulmonary findings. No pleural effusion or pulmonary lesions. The bony structures are intact. Possible small focus of AVN involving the right humeral head. IMPRESSION: No acute cardiopulmonary findings. Electronically Signed  By: Marijo Sanes M.D.   On: 02/29/2020 07:39   DG Chest 2 View  Result Date: 02/27/2020 CLINICAL DATA:  Chest pain. EXAM: CHEST - 2 VIEW COMPARISON:  January 26, 2020 FINDINGS: Stable bilateral venous Port-A-Cath positioning is seen. There is no evidence of acute infiltrate, pleural effusion or pneumothorax. The heart size and mediastinal contours are within normal limits. Radiopaque surgical clips are seen overlying the right upper quadrant. The visualized skeletal structures are unremarkable. IMPRESSION: No active cardiopulmonary disease. Electronically Signed   By: Virgina Norfolk M.D.   On: 02/27/2020 21:29    Procedures Procedures (including critical care time)  Medications Ordered in ED Medications  ketorolac (TORADOL) 15 MG/ML injection 15 mg (has no administration in time range)  sodium chloride 0.9 % bolus 500 mL (has no administration in time range)  HYDROmorphone (DILAUDID) injection 2 mg (2 mg Intravenous Given 02/29/20 0723)  diphenhydrAMINE (BENADRYL) injection 12.5 mg (12.5 mg Intravenous Given 02/29/20 0723)  HYDROmorphone (DILAUDID) injection 2 mg (2 mg Intravenous Given 02/29/20 0841)  HYDROmorphone (DILAUDID) injection 2 mg (2 mg Intravenous Given 02/29/20 1052)  diphenhydrAMINE (BENADRYL) injection 12.5 mg (12.5 mg Intravenous Given 02/29/20 1052)    ED Course  I have reviewed the triage vital signs and the nursing notes.  Pertinent labs & imaging results that were available during my care of the patient were reviewed by me and considered in my medical decision making (see chart for details).    MDM Rules/Calculators/A&P                         Additional history obtained from: 1. Nursing notes from this visit. 2. Electronic medical record  review. Reviewed patient's ED visit, she presented the night of February 27, 2020. She had reassuring work-up including CBC, CMP, reticulocyte count, troponin, EKG and chest x-ray. She received 3 doses of Dilaudid, 1 dose Toradol, promethazine, Benadryl. She was discharged around 5 AM September 13. ------------------------------------------ 39 year old female history as above presented for what she describes as her typical sickle cell crisis, does include chest pain which she reports is normal for her sickle cell disease. She reports compliance with Xarelto. Low suspicion for PE. Vital signs stable on room air without tachycardia or hypoxia. Patient presented around 1030 last night, blood work deferred as she requested blood to be drawn from her port. Unfortunately she had an extended wait time due to volume. Will obtain sickle cell labs in addition to troponin chest x-ray and treat pain with hydromorphone/Benadryl and monitor patient. - I ordered, reviewed and interpreted labs which include: High-sensitivity troponin within normal limits (3, same as 2 days ago), symptoms ongoing for over 1 week, no indication for delta troponin. CMP shows no emergent electrolyte derangement, AKI, gap.  Bilirubin 3.7 similar to prior, no other LFT elevations. Reticulocyte elevation similar to prior. CBC shows leukocytosis of 16.0 similar to prior, no acute elevation suggestive of bacterial infection.  Hemoglobin 8.8 appears stable from prior.  Thrombocytosis 406 6 similar to prior.  CXR:  IMPRESSION:  No acute cardiopulmonary findings.   EKG: Sinus tachycardia Biatrial enlargement Nonspecific T abnormalities, anterior leads 12 Lead; Mason-Likar No significant change since prior yesterday Confirmed by Aletta Edouard [phone]) on 02/29/2020 7:03:50 AM - 8:20 AM: Patient reassessment she is resting comfortably in bed no acute distress vital signs stable, listening to music on her phone.  She reports some improvement of  pain following first dose Dilaudid, she is requesting additional medication.  Will order second dose and reassess.  Work-up above is reassuring suggestive of sickle cell pain crises.  No evidence for acute chest, ACS, dissection, pulmonary embolism or other acute cardiopulmonary pathologies at this time.  Will continue to monitor patient. Discussed case with Dr. Melina Copa who agrees with plan. - 9:05 AM: Patient has received second dose Dilaudid, reassessed she is resting comfortably in bed watching a video on her phone.  Vital signs stable on room air.  Reports continued pain.  Patient requesting transfer to sickle cell center.  Consult placed. - 9:30 AM: Discussed case with Smith Robert NP sickle cell team.  Advises that sickle cell clinic is capped for today, give third dose Dilaudid and attempt to control pain and home for patient to go to sickle cell clinic tomorrow. - Patient was reassessed multiple times, she received third dose of Dilaudid, she reported no improvement in her pain.  Patient is requesting admission for further pain control.  I also ordered 15 mg Toradol for pain control. Of note patient denies chance of pregnancy today, she reports that she is recently finished menstrual cycle.  Patient states understanding that medications given or prescribed today may result in harm to of a pregnancy and she accepts these risks and still chooses not to be pregnancy tested and proceed with medications.  Additionally patient denies history of CKD or gastric ulcers.  - 11:57 AM: Discussed case with Smith Robert NP sickle cell team.  Patient accepted for observation admission.   Note: Portions of this report may have been transcribed using voice recognition software. Every effort was made to ensure accuracy; however, inadvertent computerized transcription errors may still be present. Final Clinical Impression(s) / ED Diagnoses Final diagnoses:  Sickle cell pain crisis [facility])    Rx / DC Orders ED Discharge  Orders    None       Gari Crown 02/29/20 1159    Hayden Rasmussen, MD 02/29/20 [phone]

## 2020-02-29 NOTE — ED Notes (Signed)
Patient is aware we need urine sample, doesn't feel like she can urinate at this time.

## 2020-02-29 NOTE — ED Notes (Addendum)
SATURATION QUALIFICATIONS:   Patient Saturations on Room Air at Rest =96%  Patient Saturations on [employer] while Ambulating =90-95%

## 2020-02-29 NOTE — H&P (Signed)
H&P  Patient Demographics:  Molly Gutierrez, is a 39 y.o. female  MRN: [id number]   DOB - [birth year]  Admit Date - 02/29/2020  Outpatient Primary MD for the patient is Vevelyn Francois, NP  Chief Complaint  Patient presents with  . Sickle Cell Pain Crisis      HPI:   Molly Gutierrez  is a 39 y.o. female with a medical history significant for sickle cell disease, chronic pain syndrome, opiate dependence and tolerance, history of pulmonary embolism, and history of mild intermittent asthma presents to the ER with complaints of anterior chest wall pain and low back pain that is consistent with her previous sickle cell pain crisis.  Patient was treated and evaluated in the ER 2 days prior for this problem without sustained relief.  Patient states that she has been trying to manage her pain at home without success.  She has not identified any palliative or provocative factors concerning current crisis.  Current pain intensity is 8/10 characterized as constant and throbbing.  She denies any shortness of breath, dizziness, paresthesias, urinary symptoms, nausea, vomiting, or diarrhea.  She has had no sick contacts, recent travel, or exposure to COVID-19 infection.  ER course: Vital signs show BP 110/79 (BP Location: Left Arm)   Pulse 87   Temp 99.1 F (37.3 C) (Oral)   Resp (!) 21   LMP 02/08/2020   SpO2 94% .  WBC 16.0.  Hemoglobin 8.8, appears to be consistent with patient's baseline.  Total bilirubin elevated at 3.7.  Chest x-ray shows no acute cardiopulmonary process.  Troponin negative.  Pain persists despite IV Dilaudid, IV Toradol, and IV fluids.  Patient admitted to [facility] for further management of sickle cell pain crisis.    Review of systems:  In addition to the HPI above, patient reports No fever or chills No Headache, No changes with vision or hearing No problems swallowing food or liquids No chest pain, cough or shortness of breath No abdominal pain, No nausea or  vomiting, Bowel movements are regular No blood in stool or urine No dysuria No new skin rashes or bruises No new joints pains-aches No new weakness, tingling, numbness in any extremity No recent weight gain or loss No polyuria, polydypsia or polyphagia No significant Mental Stressors  A full 10 point Review of Systems was done, except as stated above, all other Review of Systems were negative.  With Past History of the following :   Past Medical History:  Diagnosis Date  . Asthma   . Eczema   . History of pulmonary embolus (PE)   . Sickle cell anemia (HCC)       Past Surgical History:  Procedure Laterality Date  . CHOLECYSTECTOMY    . ERCP    . JOINT REPLACEMENT    . PORTA CATH INSERTION    . TUBAL LIGATION    . WISDOM TOOTH EXTRACTION       Social History:   Social History   Tobacco Use  . Smoking status: Never Smoker  . Smokeless tobacco: Never Used  Substance Use Topics  . Alcohol use: Never     Lives - At home   Family History :   Family History  Problem Relation Age of Onset  . Renal Disease Mother   . Hypertension Mother   . High Cholesterol Mother      Home Medications:   Prior to Admission medications   Medication Sig Start Date End Date Taking? Authorizing Provider  albuterol (VENTOLIN HFA)  108 (90 Base) MCG/ACT inhaler Inhale 2 puffs into the lungs 2 (two) times daily as needed for wheezing or shortness of breath. 12/31/16  Yes [provider]  celecoxib (CELEBREX) 200 MG capsule Take 200 mg by mouth daily.  10/14/19  Yes [provider]  Cholecalciferol (VITAMIN D3) 25 MCG (1000 UT) CAPS Take 1,000 Units by mouth daily.   Yes [provider]  Deferasirox (JADENU) 360 MG TABS Take 1,080 mg by mouth daily before breakfast. 08/08/19 08/07/20 Yes [provider]  diphenhydrAMINE (BENADRYL) 25 mg capsule Take 25 mg by mouth 3 (three) times daily as needed for itching.   Yes [provider]  folic acid  (FOLVITE) 1 MG tablet Take 1 mg by mouth daily. 12/31/16  Yes [provider]  gabapentin (NEURONTIN) 300 MG capsule Take 300 mg by mouth 3 (three) times daily. 10/13/19  Yes [provider]  HYDROmorphone (DILAUDID) 4 MG tablet Take 4 mg by mouth every 6 (six) hours as needed for moderate pain or severe pain.  08/20/19  Yes [provider]  mirtazapine (REMERON) 15 MG tablet Take 45 mg by mouth at bedtime.  08/08/19  Yes [provider]  mometasone-formoterol (DULERA) 100-5 MCG/ACT AERO Inhale 2 puffs into the lungs daily as needed for wheezing or shortness of breath.   Yes [provider]  omeprazole (PRILOSEC) 20 MG capsule Take 20 mg by mouth 2 (two) times a day. 09/08/18  Yes [provider]  PROMETHAZINE HCL PO Take 1 tablet by mouth every 6 (six) hours as needed.   Yes [provider]  vitamin B-12 (CYANOCOBALAMIN) 1000 MCG tablet Take 1,000 mcg by mouth daily.   Yes [provider]  XARELTO 20 MG TABS tablet Take 1 tablet (20 mg total) by mouth daily. Patient taking differently: Take 20 mg by mouth daily with supper.  05/14/19  Yes [name], MD  ondansetron (ZOFRAN) 4 MG tablet Take 1 tablet (4 mg total) by mouth every 8 (eight) hours as needed for nausea or vomiting. Patient not taking: Reported on 02/29/2020 09/15/19   Rolland Porter, MD  promethazine (PHENERGAN) 25 MG suppository Place 1 suppository (25 mg total) rectally every 6 (six) hours as needed for nausea or vomiting. Patient not taking: Reported on 02/29/2020 08/14/19   Tedd Sias, PA     Allergies:   Allergies  Allergen Reactions  . Cefaclor Hives and Swelling  . Hydroxyurea Palpitations and Other (See Comments)    Lowers "blood levels" and heart rate (causes HYPOtension); "it messes me up, it drops my levels and stuff"  Other reaction(s): Hypotension, Other (See Comments) "it messes me up, it drops my levels and stuff" Lower blood levels and  HR  Other reaction(s): Other (See Comments) Lowers "blood levels" and heart rate (causes HYPOtension); "it messes me up, it drops my levels and stuff"  . Ketamine Palpitations and Other (See Comments)    "Pt states she has had previous reaction to ketamine. States she becomes flushed, heart races, dizzy, and feels like she is going to pass out." Other reaction(s): Other (See Comments), Other (See Comments) "Pt states she has had previous reaction to ketamine. States she becomes flushed, heart races, dizzy, and feels like she is going to pass out." "Pt states she has had previous reaction to ketamine. States she becomes flushed, heart races, dizzy, and feels like she is going to pass out."     Physical Exam:   Vitals:   Vitals:  02/29/20 1700 02/29/20 1743  BP: 117/70 110/79  Pulse: 90 87  Resp: 15 (!) 21  Temp:  99.1 F (37.3 C)  SpO2: 93% 94%    Physical Exam: Constitutional: Patient appears well-developed and well-nourished. Not in obvious distress. HENT: Normocephalic, atraumatic, External right and left ear normal. Oropharynx is clear and moist.  Eyes: Conjunctivae and EOM are normal. PERRLA, no scleral icterus. Neck: Normal ROM. Neck supple. No JVD. No tracheal deviation. No thyromegaly. CVS: RRR, S1/S2 +, no murmurs, no gallops, no carotid bruit.  Pulmonary: Effort and breath sounds normal, no stridor, rhonchi, wheezes, rales.  Abdominal: Soft. BS +, no distension, tenderness, rebound or guarding.  Musculoskeletal: Normal range of motion. No edema and no tenderness.  Lymphadenopathy: No lymphadenopathy noted, cervical, inguinal or axillary Neuro: Alert. Normal reflexes, muscle tone coordination. No cranial nerve deficit. Skin: Skin is warm and dry. No rash noted. Not diaphoretic. No erythema. No pallor. Psychiatric: Normal mood and affect. Behavior, judgment, thought content normal.   Data Review:   CBC Recent Labs  Lab 02/27/20 2106 02/29/20 0645  WBC 16.6*  16.0*  HGB 9.0* 8.8*  HCT 26.1* 25.5*  PLT 422* 466*  MCV 91.3 89.8  MCH 31.5 31.0  MCHC 34.5 34.5  RDW 18.6* 18.3*  LYMPHSABS 4.0 3.1  MONOABS 2.5* 2.7*  EOSABS 0.2 0.3  BASOSABS 0.1 0.1   ------------------------------------------------------------------------------------------------------------------  Chemistries  Recent Labs  Lab 02/27/20 2106 02/29/20 0645  NA 138 136  K 4.1 3.8  CL 105 102  CO2 23 21*  GLUCOSE 98 97  BUN 11 10  CREATININE 0.43* 0.44  CALCIUM 9.3 9.0  AST 38 36  ALT 26 22  ALKPHOS 77 68  BILITOT 3.2* 3.7*   ------------------------------------------------------------------------------------------------------------------ estimated creatinine clearance is 78.9 mL/min (by C-G formula based on SCr of 0.44 mg/dL). ------------------------------------------------------------------------------------------------------------------ No results for input(s): TSH, T4TOTAL, T3FREE, THYROIDAB in the last 72 hours.  Invalid input(s): FREET3  Coagulation profile No results for input(s): INR, PROTIME in the last 168 hours. ------------------------------------------------------------------------------------------------------------------- No results for input(s): DDIMER in the last 72 hours. -------------------------------------------------------------------------------------------------------------------  Cardiac Enzymes No results for input(s): CKMB, TROPONINI, MYOGLOBIN in the last 168 hours.  Invalid input(s): CK ------------------------------------------------------------------------------------------------------------------    Component Value Date/Time   BNP 70.6 05/05/2019 0204    ---------------------------------------------------------------------------------------------------------------  Urinalysis    Component Value Date/Time   COLORURINE YELLOW 08/14/2019 1800   APPEARANCEUR CLEAR 08/14/2019 1800   LABSPEC 1.011 08/14/2019 1800   PHURINE  7.0 08/14/2019 1800   GLUCOSEU NEGATIVE 08/14/2019 1800   HGBUR SMALL (A) 08/14/2019 1800   BILIRUBINUR NEGATIVE 08/14/2019 1800   BILIRUBINUR small 07/09/2019 1440   KETONESUR NEGATIVE 08/14/2019 1800   PROTEINUR 30 (A) 08/14/2019 1800   UROBILINOGEN 1.0 07/09/2019 1440   NITRITE NEGATIVE 08/14/2019 1800   LEUKOCYTESUR NEGATIVE 08/14/2019 1800    ----------------------------------------------------------------------------------------------------------------   Imaging Results:    DG Chest 2 View  Result Date: 02/29/2020 CLINICAL DATA:  Chest pain. Sickle cell crisis. EXAM: CHEST - 2 VIEW COMPARISON:  Chest x-ray 02/27/2020 FINDINGS: Bilateral Port-A-Cath. In good position, unchanged. The cardiac silhouette, mediastinal and hilar contours are within normal limits and stable. No acute pulmonary findings. No pleural effusion or pulmonary lesions. The bony structures are intact. Possible small focus of AVN involving the right humeral head. IMPRESSION: No acute cardiopulmonary findings. Electronically Signed   By: Marijo Sanes M.D.   On: 02/29/2020 07:39   DG Chest 2 View  Result Date: 02/27/2020 CLINICAL DATA:  Chest pain. EXAM:  CHEST - 2 VIEW COMPARISON:  January 26, 2020 FINDINGS: Stable bilateral venous Port-A-Cath positioning is seen. There is no evidence of acute infiltrate, pleural effusion or pneumothorax. The heart size and mediastinal contours are within normal limits. Radiopaque surgical clips are seen overlying the right upper quadrant. The visualized skeletal structures are unremarkable. IMPRESSION: No active cardiopulmonary disease. Electronically Signed   By: Virgina Norfolk M.D.   On: 02/27/2020 21:29     Assessment & Plan:  Principal Problem:   Sickle cell pain crisis ([place]) Active Problems:   Leukocytosis   History of pulmonary embolism   Asthma   Anterior chest wall pain  Sickle cell disease with pain crisis: Admit to MedSurg.  Continue IV fluids, 0.45% saline at 100  mL/h Weight-based Dilaudid PCA with settings of 0.5 mg, 10-minute lockout, and 3 mg/h. IV Toradol 15 mg every 6 hours for total of 5 days Hold home Dilaudid, use PCA substitute. Monitor vital signs closely, reevaluate pain scale regularly, and supplemental oxygen as needed.  Patient will be reevaluated for pain in the context of function and relationship to baseline as care progresses.  Leukocytosis: Leukocytosis is chronic and more than likely reactive secondary to vaso-occlusive pain crisis.  There is no significant evidence for infection or inflammation.  COVID-19 test negative.  Monitor closely without antibiotics.  Sickle cell anemia: Hemoglobin is stable and consistent with patient's baseline.  There is no clinical occasion for blood transfusion on today.  Repeat CBC in a.m.  History of pulmonary embolism: Continue Xarelto  Mild intermittent asthma: Chest x-ray shows no acute cardiopulmonary process.  Continue home medications.  DVT Prophylaxis: Continue Xarelto and SCDs  AM Labs Ordered, also please review Full Orders  Family Communication: Admission, patient's condition and plan of care including tests being ordered have been discussed with the patient who indicate understanding and agree with the plan and Code Status.  Code Status: Full Code  Consults called: None    Admission status: Inpatient    Time spent in minutes : 35 minutes  [place], MSN, FNP-C Patient [facility] [address] [place], [place] [address] [phone]  02/29/2020 at 5:53 PM

## 2020-02-29 NOTE — ED Notes (Signed)
Called lab to check on patient blood work as it is not showing as processing. Lab stated they had not received it but then were able to locate it and they are working on it.

## 2020-03-01 DIAGNOSIS — D5700 Hb-SS disease with crisis, unspecified: Secondary | ICD-10-CM | POA: Diagnosis not present

## 2020-03-01 LAB — CBC
HCT: 21.8 % — ABNORMAL LOW (ref 36.0–46.0)
Hemoglobin: 7.7 g/dL — ABNORMAL LOW (ref 12.0–15.0)
MCH: 32 pg (ref 26.0–34.0)
MCHC: 35.3 g/dL (ref 30.0–36.0)
MCV: 90.5 fL (ref 80.0–100.0)
Platelets: 444 10*3/uL — ABNORMAL HIGH (ref 150–400)
RBC: 2.41 MIL/uL — ABNORMAL LOW (ref 3.87–5.11)
RDW: 19.5 % — ABNORMAL HIGH (ref 11.5–15.5)
WBC: 15 10*3/uL — ABNORMAL HIGH (ref 4.0–10.5)
nRBC: 0.2 % (ref 0.0–0.2)

## 2020-03-01 MED ORDER — OXYCODONE HCL 5 MG PO TABS
10.0000 mg | ORAL_TABLET | ORAL | Status: DC | PRN
  Administered 2020-03-01 – 2020-03-03 (×3): 10 mg via ORAL
  Filled 2020-03-01 (×3): qty 2

## 2020-03-01 MED ORDER — HYDROMORPHONE 1 MG/ML IV SOLN
INTRAVENOUS | Status: DC
  Administered 2020-03-01: 4 mg via INTRAVENOUS
  Administered 2020-03-01: 0.5 mg via INTRAVENOUS
  Administered 2020-03-01: 4 mg via INTRAVENOUS
  Administered 2020-03-02: 30 mg via INTRAVENOUS
  Administered 2020-03-02: 1.5 mg via INTRAVENOUS
  Administered 2020-03-02: 0.5 mg via INTRAVENOUS
  Filled 2020-03-01: qty 30

## 2020-03-01 NOTE — Progress Notes (Signed)
Subjective: Molly Gutierrez a 39 year old female with a medical history significant for sickle cell disease, chronic pain syndrome, opiate dependence and tolerance, and history of mild intermittent asthma was admitted with sickle cell pain crisis.   Patient says that pain has improved some overnight.  She rates pain as 7/10 primarily to central chest and low back.  She denies any headache, dizziness, shortness of breath, fatigue, urinary symptoms, nausea, vomiting, or diarrhea.   Objective:  Vital signs in last 24 hours:  Vitals:   03/01/20 1258 03/01/20 1421 03/01/20 1705 03/01/20 1814  BP:  101/69  125/82  Pulse:  90  100  Resp: 14 16 14 13   Temp:  98.9 F (37.2 C)  99.5 F (37.5 C)  TempSrc:  Oral  Oral  SpO2: 92% 92% 92% 93%  Weight:      Height:        Intake/Output from previous day:   Intake/Output Summary (Last 24 hours) at 03/01/2020 1821 Last data filed at 03/01/2020 1100 Gross per 24 hour  Intake 100 ml  Output 900 ml  Net -800 ml    Physical Exam: General: Alert, awake, oriented x3, in no acute distress.  HEENT: [place]/AT PEERL, EOMI Neck: Trachea midline,  no masses, no thyromegal,y no JVD, no carotid bruit OROPHARYNX:  Moist, No exudate/ erythema/lesions.  Heart: Regular rate and rhythm, without murmurs, rubs, gallops, PMI non-displaced, no heaves or thrills on palpation.  Lungs: Clear to auscultation, no wheezing or rhonchi noted. No increased vocal fremitus resonant to percussion  Abdomen: Soft, nontender, nondistended, positive bowel sounds, no masses no hepatosplenomegaly noted..  Neuro: No focal neurological deficits noted cranial nerves II through XII grossly intact. DTRs 2+ bilaterally upper and lower extremities. Strength 5 out of 5 in bilateral upper and lower extremities. Musculoskeletal: No warm swelling or erythema around joints, no spinal tenderness noted. Psychiatric: Patient alert and oriented x3, good insight and cognition, good recent to  remote recall. Lymph node survey: No cervical axillary or inguinal lymphadenopathy noted.  Lab Results:  Basic Metabolic Panel:    Component Value Date/Time   NA 136 02/29/2020 0645   K 3.8 02/29/2020 0645   CL 102 02/29/2020 0645   CO2 21 (L) 02/29/2020 0645   BUN 10 02/29/2020 0645   CREATININE 0.44 02/29/2020 0645   GLUCOSE 97 02/29/2020 0645   CALCIUM 9.0 02/29/2020 0645   CBC:    Component Value Date/Time   WBC 15.0 (H) 03/01/2020 0545   HGB 7.7 (L) 03/01/2020 0545   HCT 21.8 (L) 03/01/2020 0545   PLT 444 (H) 03/01/2020 0545   MCV 90.5 03/01/2020 0545   NEUTROABS 9.8 (H) 02/29/2020 0645   LYMPHSABS 3.1 02/29/2020 0645   MONOABS 2.7 (H) 02/29/2020 0645   EOSABS 0.3 02/29/2020 0645   BASOSABS 0.1 02/29/2020 0645    Recent Results (from the past 240 hour(s))  SARS Coronavirus 2 by RT PCR (hospital order, performed in [facility] hospital lab) Nasopharyngeal Nasopharyngeal Swab     Status: None   Collection Time: 02/29/20 12:40 PM   Specimen: Nasopharyngeal Swab  Result Value Ref Range Status   SARS Coronavirus 2 NEGATIVE NEGATIVE Final    Comment: (NOTE) SARS-CoV-2 target nucleic acids are NOT DETECTED.  The SARS-CoV-2 RNA is generally detectable in upper and lower respiratory specimens during the acute phase of infection. The lowest concentration of SARS-CoV-2 viral copies this assay can detect is 250 copies / mL. A negative result does not preclude SARS-CoV-2 infection and  should not be used as the sole basis for treatment or other patient management decisions.  A negative result may occur with improper specimen collection / handling, submission of specimen other than nasopharyngeal swab, presence of viral mutation(s) within the areas targeted by this assay, and inadequate number of viral copies (<250 copies / mL). A negative result must be combined with clinical observations, patient history, and epidemiological information.  Fact Sheet for Patients:    StrictlyIdeas.no  Fact Sheet for Healthcare Providers: BankingDealers.co.za  This test is not yet approved or  cleared by the [place] FDA and has been authorized for detection and/or diagnosis of SARS-CoV-2 by FDA under an Emergency Use Authorization (EUA).  This EUA will remain in effect (meaning this test can be used) for the duration of the COVID-19 declaration under Section 564(b)(1) of the Act, 21 U.S.C. section 360bbb-3(b)(1), unless the authorization is terminated or revoked sooner.  Performed at [facility], [place] [address], [place], [place] [address]     Studies/Results: DG Chest 2 View  Result Date: 02/29/2020 CLINICAL DATA:  Chest pain. Sickle cell crisis. EXAM: CHEST - 2 VIEW COMPARISON:  Chest x-ray 02/27/2020 FINDINGS: Bilateral Port-A-Cath. In good position, unchanged. The cardiac silhouette, mediastinal and hilar contours are within normal limits and stable. No acute pulmonary findings. No pleural effusion or pulmonary lesions. The bony structures are intact. Possible small focus of AVN involving the right humeral head. IMPRESSION: No acute cardiopulmonary findings. Electronically Signed   By: Marijo Sanes M.D.   On: 02/29/2020 07:39    Medications: Scheduled Meds: . Chlorhexidine Gluconate Cloth  6 each Topical Daily  . Deferasirox  1,080 mg Oral QAC breakfast  . folic acid  1 mg Oral Daily  . gabapentin  300 mg Oral TID  . HYDROmorphone   Intravenous Q4H  . ketorolac  15 mg Intravenous Q6H  . mirtazapine  45 mg Oral QHS  . mometasone-formoterol  2 puff Inhalation BID  . pantoprazole  40 mg Oral Daily  . rivaroxaban  20 mg Oral Q supper  . senna-docusate  1 tablet Oral BID   Continuous Infusions: . diphenhydrAMINE     PRN Meds:.albuterol, diphenhydrAMINE **OR** diphenhydrAMINE, naloxone **AND** sodium chloride flush, ondansetron (ZOFRAN) IV, oxyCODONE, polyethylene  glycol  Consultants:  None  Procedures:  None  Antibiotics:  None  Assessment/Plan: Principal Problem:   Sickle cell pain crisis ([place]) Active Problems:   Leukocytosis   History of pulmonary embolism   Asthma   Anterior chest wall pain  Sickle cell disease with pain crisis: [facility] at [facility].  Weaning IV Dilaudid PCA, settings decreased to 0.5 mg, 10-minute lockout, and 2 mg/h. IV Toradol 15 mg every 6 hours for total of 5 days. Monitor vital signs closely, reevaluate pain scale regularly, and supplemental oxygen as needed.  Leukocytosis: Improving.  More than likely reactive secondary to vaso-occlusive pain crisis.  There is no significant evidence of infection or inflammation.  Monitor closely without antibiotics.  CBC in a.m.  Sickle cell anemia: Hemoglobin is 7.7, which is slightly below patient's baseline.  There is no clinical indication for blood transfusion today.  Repeat CBC in a.m.  History of pulmonary embolism: Continue Xarelto  Mild intermittent asthma: Stable.  Continue home medications as needed. Code Status: Full Code Family Communication: N/A Disposition Plan: Not yet ready for discharge    Aliegha Paullin Al Decant  APRN, MSN, FNP-C Patient [place], [place]  [id number] [phone]  If 5PM-8AM, please contact night-coverage.  03/01/2020, 6:21 PM  LOS: 1 day

## 2020-03-02 DIAGNOSIS — D5700 Hb-SS disease with crisis, unspecified: Secondary | ICD-10-CM | POA: Diagnosis not present

## 2020-03-02 DIAGNOSIS — J4520 Mild intermittent asthma, uncomplicated: Secondary | ICD-10-CM | POA: Diagnosis not present

## 2020-03-02 DIAGNOSIS — R0789 Other chest pain: Secondary | ICD-10-CM | POA: Diagnosis not present

## 2020-03-02 DIAGNOSIS — Z86711 Personal history of pulmonary embolism: Secondary | ICD-10-CM | POA: Diagnosis not present

## 2020-03-02 LAB — CBC
HCT: 22.1 % — ABNORMAL LOW (ref 36.0–46.0)
Hemoglobin: 7.6 g/dL — ABNORMAL LOW (ref 12.0–15.0)
MCH: 30.8 pg (ref 26.0–34.0)
MCHC: 34.4 g/dL (ref 30.0–36.0)
MCV: 89.5 fL (ref 80.0–100.0)
Platelets: 481 10*3/uL — ABNORMAL HIGH (ref 150–400)
RBC: 2.47 MIL/uL — ABNORMAL LOW (ref 3.87–5.11)
RDW: 19.8 % — ABNORMAL HIGH (ref 11.5–15.5)
WBC: 20.7 10*3/uL — ABNORMAL HIGH (ref 4.0–10.5)
nRBC: 0.2 % (ref 0.0–0.2)

## 2020-03-02 MED ORDER — HYDROMORPHONE 1 MG/ML IV SOLN
INTRAVENOUS | Status: DC
  Administered 2020-03-02: 1.5 mg via INTRAVENOUS
  Administered 2020-03-03: 6 mg via INTRAVENOUS
  Administered 2020-03-03: 5 mg via INTRAVENOUS
  Administered 2020-03-03: 30 mg via INTRAVENOUS
  Administered 2020-03-03: 1.5 mg via INTRAVENOUS
  Administered 2020-03-03: 5 mg via INTRAVENOUS
  Administered 2020-03-03: 2.5 mg via INTRAVENOUS
  Administered 2020-03-04: 4.5 mg via INTRAVENOUS
  Administered 2020-03-04: 5 mg via INTRAVENOUS
  Administered 2020-03-04: 1 mg via INTRAVENOUS
  Administered 2020-03-04: 7 mg via INTRAVENOUS
  Administered 2020-03-04: 1.5 mg via INTRAVENOUS
  Administered 2020-03-04: 0.5 mg via INTRAVENOUS
  Administered 2020-03-05: 5.5 mg via INTRAVENOUS
  Administered 2020-03-05: 1 mg via INTRAVENOUS
  Administered 2020-03-05: 30 mg via INTRAVENOUS
  Administered 2020-03-05: 3 mg via INTRAVENOUS
  Administered 2020-03-05: 3.5 mg via INTRAVENOUS
  Filled 2020-03-02 (×2): qty 30
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
## 2020-03-03 DIAGNOSIS — D5700 Hb-SS disease with crisis, unspecified: Secondary | ICD-10-CM | POA: Diagnosis not present

## 2020-03-03 DIAGNOSIS — Z86711 Personal history of pulmonary embolism: Secondary | ICD-10-CM | POA: Diagnosis not present

## 2020-03-03 DIAGNOSIS — D649 Anemia, unspecified: Secondary | ICD-10-CM | POA: Diagnosis not present

## 2020-03-03 DIAGNOSIS — D72829 Elevated white blood cell count, unspecified: Secondary | ICD-10-CM | POA: Diagnosis not present

## 2020-03-03 LAB — CBC
HCT: 21.2 % — ABNORMAL LOW (ref 36.0–46.0)
Hemoglobin: 7.3 g/dL — ABNORMAL LOW (ref 12.0–15.0)
MCH: 31.9 pg (ref 26.0–34.0)
MCHC: 34.4 g/dL (ref 30.0–36.0)
MCV: 92.6 fL (ref 80.0–100.0)
Platelets: 475 10*3/uL — ABNORMAL HIGH (ref 150–400)
RBC: 2.29 MIL/uL — ABNORMAL LOW (ref 3.87–5.11)
RDW: 20.5 % — ABNORMAL HIGH (ref 11.5–15.5)
WBC: 23.5 10*3/uL — ABNORMAL HIGH (ref 4.0–10.5)
nRBC: 0.6 % — ABNORMAL HIGH (ref 0.0–0.2)

## 2020-03-03 NOTE — Progress Notes (Signed)
Patient ID: Molly Gutierrez, female   DOB: 11/25/1980, 39 y.o.   MRN: [id number] Subjective: Molly Gutierrez is a 39 year old female with a medical history significant for sickle cell disease, chronic pain syndrome, opiate dependence and tolerance, and history of mild intermittent asthma was admitted for sickle cell pain crisis.  Patient claims some improvement in her pain but still not at baseline.  She rates her pain at 7/10 today, localized to her lower back, lower extremities.  She denies any chest pain today, she denies any headache, dizziness, fever, cough, shortness of breath, nausea, vomiting or diarrhea.  She has no urinary symptoms.  Objective:  Vital signs in last 24 hours:  Vitals:   03/03/20 0834 03/03/20 0949 03/03/20 1245 03/03/20 1356  BP:  108/70  111/74  Pulse:  98  98  Resp: 16 12 14 13   Temp:  99.6 F (37.6 C)  98.9 F (37.2 C)  TempSrc:  Oral  Oral  SpO2: 94% 94% 95% 95%  Weight:      Height:       Intake/Output from previous day:  Physical Exam: General: Alert, awake, oriented x3, in no acute distress.  HEENT: [place]/AT PEERL, EOMI Neck: Trachea midline,  no masses, no thyromegal,y no JVD, no carotid bruit OROPHARYNX:  Moist, No exudate/ erythema/lesions.  Heart: Regular rate and rhythm, without murmurs, rubs, gallops, PMI non-displaced, no heaves or thrills on palpation.  Lungs: Clear to auscultation, no wheezing or rhonchi noted. No increased vocal fremitus resonant to percussion  Abdomen: Soft, nontender, nondistended, positive bowel sounds, no masses no hepatosplenomegaly noted..  Neuro: No focal neurological deficits noted cranial nerves II through XII grossly intact. DTRs 2+ bilaterally upper and lower extremities. Strength 5 out of 5 in bilateral upper and lower extremities. Musculoskeletal: No warm swelling or erythema around joints, no spinal tenderness noted. Psychiatric: Patient alert and oriented x3, good insight and cognition, good recent to  remote recall. Lymph node survey: No cervical axillary or inguinal lymphadenopathy noted.  Lab Results:  Basic Metabolic Panel:    Component Value Date/Time   NA 136 02/29/2020 0645   K 3.8 02/29/2020 0645   CL 102 02/29/2020 0645   CO2 21 (L) 02/29/2020 0645   BUN 10 02/29/2020 0645   CREATININE 0.44 02/29/2020 0645   GLUCOSE 97 02/29/2020 0645   CALCIUM 9.0 02/29/2020 0645   CBC:    Component Value Date/Time   WBC 23.5 (H) 03/03/2020 0536   HGB 7.3 (L) 03/03/2020 0536   HCT 21.2 (L) 03/03/2020 0536   PLT 475 (H) 03/03/2020 0536   MCV 92.6 03/03/2020 0536   NEUTROABS 9.8 (H) 02/29/2020 0645   LYMPHSABS 3.1 02/29/2020 0645   MONOABS 2.7 (H) 02/29/2020 0645   EOSABS 0.3 02/29/2020 0645   BASOSABS 0.1 02/29/2020 0645    Recent Results (from the past 240 hour(s))  SARS Coronavirus 2 by RT PCR (hospital order, performed in [place] hospital lab) Nasopharyngeal Nasopharyngeal Swab     Status: None   Collection Time: 02/29/20 12:40 PM   Specimen: Nasopharyngeal Swab  Result Value Ref Range Status   SARS Coronavirus 2 NEGATIVE NEGATIVE Final    Comment: (NOTE) SARS-CoV-2 target nucleic acids are NOT DETECTED.  The SARS-CoV-2 RNA is generally detectable in upper and lower respiratory specimens during the acute phase of infection. The lowest concentration of SARS-CoV-2 viral copies this assay can detect is 250 copies / mL. A negative result does not preclude SARS-CoV-2 infection and should not be used  as the sole basis for treatment or other patient management decisions.  A negative result may occur with improper specimen collection / handling, submission of specimen other than nasopharyngeal swab, presence of viral mutation(s) within the areas targeted by this assay, and inadequate number of viral copies (<250 copies / mL). A negative result must be combined with clinical observations, patient history, and epidemiological information.  Fact Sheet for Patients:    StrictlyIdeas.no  Fact Sheet for Healthcare Providers: BankingDealers.co.za  This test is not yet approved or  cleared by the [place] FDA and has been authorized for detection and/or diagnosis of SARS-CoV-2 by FDA under an Emergency Use Authorization (EUA).  This EUA will remain in effect (meaning this test can be used) for the duration of the COVID-19 declaration under Section 564(b)(1) of the Act, 21 U.S.C. section 360bbb-3(b)(1), unless the authorization is terminated or revoked sooner.  Performed at [facility], [place] [address], [place], [place] [address]     Studies/Results: No results found.  Medications: Scheduled Meds: . Chlorhexidine Gluconate Cloth  6 each Topical Daily  . Deferasirox  1,080 mg Oral QAC breakfast  . folic acid  1 mg Oral Daily  . gabapentin  300 mg Oral TID  . HYDROmorphone   Intravenous Q4H  . ketorolac  15 mg Intravenous Q6H  . mirtazapine  45 mg Oral QHS  . mometasone-formoterol  2 puff Inhalation BID  . pantoprazole  40 mg Oral Daily  . rivaroxaban  20 mg Oral Q supper  . senna-docusate  1 tablet Oral BID   Continuous Infusions: . diphenhydrAMINE     PRN Meds:.albuterol, diphenhydrAMINE **OR** diphenhydrAMINE, naloxone **AND** sodium chloride flush, ondansetron (ZOFRAN) IV, oxyCODONE, polyethylene glycol  Consultants:  None  Procedures:  None  Antibiotics:  None  Assessment/Plan: Principal Problem:   Sickle cell pain crisis ([place]) Active Problems:   Leukocytosis   History of pulmonary embolism   Asthma   Anterior chest wall pain  1. Hb Sickle Cell Disease with crisis: Continue IVF at [facility], continue weight based Dilaudid PCA at current setting, continue IV Toradol 15 mg Q 6 H for total of 5 days, continue oral pain medications as ordered, monitor vitals very closely, Re-evaluate pain scale regularly, 2 L of Oxygen by [place]. 2. Leukocytosis: Slowly improving,  this appears chronic as well. There is no significant evidence of any inflammation or infection, no clinical indication for antibiotics at this time. 3. Sickle Cell Anemia: Hemoglobin is stable at baseline, no clinical indication for blood transfusion today. Will repeat CBC in a.m. 4. Chronic pain Syndrome: Continue oral pain medications. 5. History of pulmonary embolism: Continue Xarelto.  Code Status: Full Code Family Communication: N/A Disposition Plan: Not yet ready for discharge  Molly Gutierrez  If 7PM-7AM, please contact night-coverage.  03/03/2020, 4:17 PM  LOS: 3 days

## 2020-03-04 DIAGNOSIS — R0789 Other chest pain: Secondary | ICD-10-CM | POA: Diagnosis not present

## 2020-03-04 DIAGNOSIS — D5700 Hb-SS disease with crisis, unspecified: Secondary | ICD-10-CM | POA: Diagnosis not present

## 2020-03-04 DIAGNOSIS — D72829 Elevated white blood cell count, unspecified: Secondary | ICD-10-CM | POA: Diagnosis not present

## 2020-03-04 DIAGNOSIS — J4520 Mild intermittent asthma, uncomplicated: Secondary | ICD-10-CM | POA: Diagnosis not present

## 2020-03-04 LAB — BASIC METABOLIC PANEL
Anion gap: 7 (ref 5–15)
BUN: 13 mg/dL (ref 6–20)
CO2: 25 mmol/L (ref 22–32)
Calcium: 8.7 mg/dL — ABNORMAL LOW (ref 8.9–10.3)
Chloride: 106 mmol/L (ref 98–111)
Creatinine, Ser: 0.47 mg/dL (ref 0.44–1.00)
GFR calc Af Amer: >60 mL/min (ref >60)
GFR calc non Af Amer: >60 mL/min (ref >60)
Glucose, Bld: 101 mg/dL — ABNORMAL HIGH (ref 70–99)
Potassium: 4.1 mmol/L (ref 3.5–5.1)
Sodium: 138 mmol/L (ref 135–145)

## 2020-03-04 LAB — CBC WITH DIFFERENTIAL/PLATELET
Abs Immature Granulocytes: 0.47 10*3/uL — ABNORMAL HIGH (ref 0.00–0.07)
Basophils Absolute: 0.1 10*3/uL (ref 0.0–0.1)
Basophils Relative: 0 %
Eosinophils Absolute: 0.4 10*3/uL (ref 0.0–0.5)
Eosinophils Relative: 2 %
HCT: 22.3 % — ABNORMAL LOW (ref 36.0–46.0)
Hemoglobin: 7.3 g/dL — ABNORMAL LOW (ref 12.0–15.0)
Immature Granulocytes: 2 %
Lymphocytes Relative: 21 %
Lymphs Abs: 4.2 10*3/uL — ABNORMAL HIGH (ref 0.7–4.0)
MCH: 31.2 pg (ref 26.0–34.0)
MCHC: 32.7 g/dL (ref 30.0–36.0)
MCV: 95.3 fL (ref 80.0–100.0)
Monocytes Absolute: 2.6 10*3/uL — ABNORMAL HIGH (ref 0.1–1.0)
Monocytes Relative: 13 %
Neutro Abs: 12.7 10*3/uL — ABNORMAL HIGH (ref 1.7–7.7)
Neutrophils Relative %: 62 %
Platelets: 541 10*3/uL — ABNORMAL HIGH (ref 150–400)
RBC: 2.34 MIL/uL — ABNORMAL LOW (ref 3.87–5.11)
RDW: 22.3 % — ABNORMAL HIGH (ref 11.5–15.5)
WBC: 20.4 10*3/uL — ABNORMAL HIGH (ref 4.0–10.5)
nRBC: 1.5 % — ABNORMAL HIGH (ref 0.0–0.2)

## 2020-03-04 NOTE — Progress Notes (Signed)
   03/04/20 1306  Assess: MEWS Score  Temp 99.1 F (37.3 C)  BP (!) 90/57  Pulse Rate 86  Resp 11  SpO2 94 %  O2 Device Room Air  Assess: MEWS Score  MEWS Temp 0  MEWS Systolic 1  MEWS Pulse 0  MEWS RR 1  MEWS LOC 0  MEWS Score 2  MEWS Score Color Yellow  Assess: if the MEWS score is Yellow or Red  Were vital signs taken at a resting state? Yes  Focused Assessment No change from prior assessment  Early Detection of Sepsis Score *See Row Information* Low  MEWS guidelines implemented *See Row Information* Yes  Treat  MEWS Interventions Administered scheduled meds/treatments  Take Vital Signs  Increase Vital Sign Frequency  Yellow: Q 2hr X 2 then Q 4hr X 2, if remains yellow, continue Q 4hrs  Notify: Charge Nurse/RN  Name of Charge Nurse/RN Notified Daeja, RN (currently charge)  Date Charge Nurse/RN Notified 03/04/20  Time Charge Nurse/RN Notified 33  Notify: Provider  Provider Name/Title Dr. Doreene Burke  Date Provider Notified 03/04/20  Time Provider Notified 1317  Notification Type Call  Notification Reason Change in status (low BP)  Response No new orders;Other (Comment) (implement MEWS)  Date of Provider Response 03/04/20  Time of Provider Response 1317  Document  Patient Outcome Stabilized after interventions  Progress note created (see row info) Yes
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
## 2020-03-05 DIAGNOSIS — R0789 Other chest pain: Secondary | ICD-10-CM | POA: Diagnosis not present

## 2020-03-05 DIAGNOSIS — D5700 Hb-SS disease with crisis, unspecified: Secondary | ICD-10-CM | POA: Diagnosis not present

## 2020-03-05 DIAGNOSIS — Z86711 Personal history of pulmonary embolism: Secondary | ICD-10-CM | POA: Diagnosis not present

## 2020-03-05 DIAGNOSIS — J4520 Mild intermittent asthma, uncomplicated: Secondary | ICD-10-CM | POA: Diagnosis not present

## 2020-03-05 MED ORDER — HEPARIN SOD (PORK) LOCK FLUSH 100 UNIT/ML IV SOLN
500.0000 [IU] | Freq: Once | INTRAVENOUS | Status: AC
  Administered 2020-03-05: 500 [IU] via INTRAVENOUS
  Filled 2020-03-05: qty 5

## 2020-03-05 NOTE — Discharge Summary (Signed)
Physician Discharge Summary  [facility] AST:[id number] DOB: 16-Aug-1980 DOA: 02/29/2020  PCP: Vevelyn Francois, NP  Admit date: 02/29/2020  Discharge date: 03/05/2020  Discharge Diagnoses:  Principal Problem:   Sickle cell pain crisis ([name]) Active Problems:   Leukocytosis   History of pulmonary embolism   Asthma   Anterior chest wall pain  Discharge Condition: Stable  Disposition:   Follow-up Information    Vevelyn Francois, NP. Call in 1 day(s).   Specialty: Adult Health Nurse Practitioner Contact information: [address] [place] [address] [phone]              Pt is discharged home in good condition and is to follow up with Vevelyn Francois, NP this week to have labs evaluated. [name] is instructed to increase activity slowly and balance with rest for the next few days, and use prescribed medication to complete treatment of pain  Diet: Regular Wt Readings from Last 3 Encounters:  03/01/20 62.9 kg  02/16/20 55.5 kg  01/27/20 57.6 kg   History of present illness:  Molly Gutierrez  is a 39 y.o. female with a medical history significant for sickle cell disease, chronic pain syndrome, opiate dependence and tolerance, history of pulmonary embolism, and history of mild intermittent asthma presents to the ER with complaints of anterior chest wall pain and low back pain that is consistent with her previous sickle cell pain crisis.  Patient was treated and evaluated in the ER 2 days prior for this problem without sustained relief.  Patient states that she has been trying to manage her pain at home without success.  She has not identified any palliative or provocative factors concerning current crisis.  Current pain intensity is 8/10 characterized as constant and throbbing.  She denies any shortness of breath, dizziness, paresthesias, urinary symptoms, nausea, vomiting, or diarrhea.  She has had no sick contacts, recent travel, or exposure to  COVID-19 infection.  ER course: Vital signs show BP 110/79 (BP Location: Left Arm)   Pulse 87   Temp 99.1 F (37.3 C) (Oral)   Resp (!) 21   LMP 02/08/2020   SpO2 94% .  WBC 16.0.  Hemoglobin 8.8, appears to be consistent with patient's baseline.  Total bilirubin elevated at 3.7.  Chest x-ray shows no acute cardiopulmonary process.  Troponin negative.  Pain persists despite IV Dilaudid, IV Toradol, and IV fluids.  Patient admitted to [facility] for further management of sickle cell pain crisis.   Hospital Course:  Patient was admitted for sickle cell pain crisis and managed appropriately with IVF, IV Dilaudid via PCA and IV Toradol, as well as other adjunct therapies per sickle cell pain management protocols. Patient's pain slowly improved, IV Dilaudid PCA was weaned appropriately and patient was transitioned to her home pain medications.  OxyContin was continued throughout this admission. Hemoglobin remained stable at baseline throughout this admission, she did not require blood transfusion. She remained hemodynamically stable throughout admission. As at today, patient is ambulating well with no significant pain, tolerating p.o. intake with no restrictions. Pain is now at baseline.  Patient was therefore discharged home today in a hemodynamically stable condition.   Karisha will follow-up with PCP within 1 week of this discharge. Taren was counseled extensively about nonpharmacologic means of pain management, patient verbalized understanding and was appreciative of  the care received during this admission.   We discussed the need for good hydration, monitoring of hydration status, avoidance of heat, cold, stress, and infection  triggers. We discussed the need to be adherent with taking Hydrea and other home medications. Patient was reminded of the need to seek medical attention immediately if any symptom of bleeding, anemia, or infection occurs.  Discharge Exam: Vitals:   03/05/20 1132  03/05/20 1332  BP:  108/75  Pulse:  86  Resp: 14 10  Temp:  99.4 F (37.4 C)  SpO2: 95% 97%   Vitals:   03/05/20 0827 03/05/20 0955 03/05/20 1132 03/05/20 1332  BP:  103/73  108/75  Pulse:  96  86  Resp:  12 14 10   Temp:  99.8 F (37.7 C)  99.4 F (37.4 C)  TempSrc:  Oral  Oral  SpO2: (!) 9% 94% 95% 97%  Weight:      Height:        General appearance : Awake, alert, not in any distress. Speech Clear. Not toxic looking HEENT: Atraumatic and Normocephalic, pupils equally reactive to light and accomodation Neck: Supple, no JVD. No cervical lymphadenopathy.  Chest: Good air entry bilaterally, no added sounds  CVS: S1 S2 regular, no murmurs.  Abdomen: Bowel sounds present, Non tender and not distended with no gaurding, rigidity or rebound. Extremities: B/L Lower Ext shows no edema, both legs are warm to touch Neurology: Awake alert, and oriented X 3, CN II-XII intact, Non focal Skin: No Rash  Discharge Instructions  Discharge Instructions    Diet - low sodium heart healthy   Complete by: As directed    Increase activity slowly   Complete by: As directed      Allergies as of 03/05/2020      Reactions   Cefaclor Hives, Swelling   Hydroxyurea Palpitations, Other (See Comments)   Lowers "blood levels" and heart rate (causes HYPOtension); "it messes me up, it drops my levels and stuff" Other reaction(s): Hypotension, Other (See Comments) "it messes me up, it drops my levels and stuff" Lower blood levels and HR Other reaction(s): Other (See Comments) Lowers "blood levels" and heart rate (causes HYPOtension); "it messes me up, it drops my levels and stuff"   Ketamine Palpitations, Other (See Comments)   "Pt states she has had previous reaction to ketamine. States she becomes flushed, heart races, dizzy, and feels like she is going to pass out." Other reaction(s): Other (See Comments), Other (See Comments) "Pt states she has had previous reaction to ketamine. States she  becomes flushed, heart races, dizzy, and feels like she is going to pass out." "Pt states she has had previous reaction to ketamine. States she becomes flushed, heart races, dizzy, and feels like she is going to pass out."      Medication List    STOP taking these medications   ondansetron 4 MG tablet Commonly known as: ZOFRAN     TAKE these medications   albuterol 108 (90 Base) MCG/ACT inhaler Commonly known as: VENTOLIN HFA Inhale 2 puffs into the lungs 2 (two) times daily as needed for wheezing or shortness of breath.   celecoxib 200 MG capsule Commonly known as: CELEBREX Take 200 mg by mouth daily.   diphenhydrAMINE 25 mg capsule Commonly known as: BENADRYL Take 25 mg by mouth 3 (three) times daily as needed for itching.   folic acid 1 MG tablet Commonly known as: FOLVITE Take 1 mg by mouth daily.   gabapentin 300 MG capsule Commonly known as: NEURONTIN Take 300 mg by mouth 3 (three) times daily.   HYDROmorphone 4 MG tablet Commonly known as: DILAUDID Take 4 mg by  mouth every 6 (six) hours as needed for moderate pain or severe pain.   Jadenu 360 MG Tabs Generic drug: Deferasirox Take 1,080 mg by mouth daily before breakfast.   mirtazapine 15 MG tablet Commonly known as: REMERON Take 45 mg by mouth at bedtime.   mometasone-formoterol 100-5 MCG/ACT Aero Commonly known as: DULERA Inhale 2 puffs into the lungs daily as needed for wheezing or shortness of breath.   omeprazole 20 MG capsule Commonly known as: PRILOSEC Take 20 mg by mouth 2 (two) times a day.   PROMETHAZINE HCL PO Take 1 tablet by mouth every 6 (six) hours as needed. What changed: Another medication with the same name was removed. Continue taking this medication, and follow the directions you see here.   vitamin B-12 1000 MCG tablet Commonly known as: CYANOCOBALAMIN Take 1,000 mcg by mouth daily.   Vitamin D3 25 MCG (1000 UT) Caps Take 1,000 Units by mouth daily.   Xarelto 20 MG Tabs  tablet Generic drug: rivaroxaban Take 1 tablet (20 mg total) by mouth daily. What changed: when to take this       The results of significant diagnostics from this hospitalization (including imaging, microbiology, ancillary and laboratory) are listed below for reference.    Significant Diagnostic Studies: DG Chest 2 View  Result Date: 02/29/2020 CLINICAL DATA:  Chest pain. Sickle cell crisis. EXAM: CHEST - 2 VIEW COMPARISON:  Chest x-ray 02/27/2020 FINDINGS: Bilateral Port-A-Cath. In good position, unchanged. The cardiac silhouette, mediastinal and hilar contours are within normal limits and stable. No acute pulmonary findings. No pleural effusion or pulmonary lesions. The bony structures are intact. Possible small focus of AVN involving the right humeral head. IMPRESSION: No acute cardiopulmonary findings. Electronically Signed   By: Marijo Sanes M.D.   On: 02/29/2020 07:39   DG Chest 2 View  Result Date: 02/27/2020 CLINICAL DATA:  Chest pain. EXAM: CHEST - 2 VIEW COMPARISON:  January 26, 2020 FINDINGS: Stable bilateral venous Port-A-Cath positioning is seen. There is no evidence of acute infiltrate, pleural effusion or pneumothorax. The heart size and mediastinal contours are within normal limits. Radiopaque surgical clips are seen overlying the right upper quadrant. The visualized skeletal structures are unremarkable. IMPRESSION: No active cardiopulmonary disease. Electronically Signed   By: Virgina Norfolk M.D.   On: 02/27/2020 21:29    Microbiology: Recent Results (from the past 240 hour(s))  SARS Coronavirus 2 by RT PCR (hospital order, performed in [facility] hospital lab) Nasopharyngeal Nasopharyngeal Swab     Status: None   Collection Time: 02/29/20 12:40 PM   Specimen: Nasopharyngeal Swab  Result Value Ref Range Status   SARS Coronavirus 2 NEGATIVE NEGATIVE Final    Comment: (NOTE) SARS-CoV-2 target nucleic acids are NOT DETECTED.  The SARS-CoV-2 RNA is generally detectable  in upper and lower respiratory specimens during the acute phase of infection. The lowest concentration of SARS-CoV-2 viral copies this assay can detect is 250 copies / mL. A negative result does not preclude SARS-CoV-2 infection and should not be used as the sole basis for treatment or other patient management decisions.  A negative result may occur with improper specimen collection / handling, submission of specimen other than nasopharyngeal swab, presence of viral mutation(s) within the areas targeted by this assay, and inadequate number of viral copies (<250 copies / mL). A negative result must be combined with clinical observations, patient history, and epidemiological information.  Fact Sheet for Patients:   StrictlyIdeas.no  Fact Sheet for Healthcare Providers: BankingDealers.co.za  This test is not yet approved or  cleared by the [place] and has been authorized for detection and/or diagnosis of SARS-CoV-2 by FDA under an Emergency Use Authorization (EUA).  This EUA will remain in effect (meaning this test can be used) for the duration of the COVID-19 declaration under Section 564(b)(1) of the Act, 21 U.S.C. section 360bbb-3(b)(1), unless the authorization is terminated or revoked sooner.  Performed at [facility], [place] [address] [place], [place] [address]      Labs: Basic Metabolic Panel: Recent Labs  Lab 02/27/20 2106 02/27/20 2106 02/29/20 0645 03/04/20 0544  NA 138  --  136 138  K 4.1   < > 3.8 4.1  CL 105  --  102 106  CO2 23  --  21* 25  GLUCOSE 98  --  97 101*  BUN 11  --  10 13  CREATININE 0.43*  --  0.44 0.47  CALCIUM 9.3  --  9.0 8.7*   < > = values in this interval not displayed.   Liver Function Tests: Recent Labs  Lab 02/27/20 2106 02/29/20 0645  AST 38 36  ALT 26 22  ALKPHOS 77 68  BILITOT 3.2* 3.7*  PROT 8.1 8.1  ALBUMIN 4.3 4.4   No results for input(s):  LIPASE, AMYLASE in the last 168 hours. No results for input(s): AMMONIA in the last 168 hours. CBC: Recent Labs  Lab 02/27/20 2106 02/27/20 2106 02/29/20 0645 03/01/20 0545 03/02/20 0339 03/03/20 0536 03/04/20 0544  WBC 16.6*   < > 16.0* 15.0* 20.7* 23.5* 20.4*  NEUTROABS 9.7*  --  9.8*  --   --   --  12.7*  HGB 9.0*   < > 8.8* 7.7* 7.6* 7.3* 7.3*  HCT 26.1*   < > 25.5* 21.8* 22.1* 21.2* 22.3*  MCV 91.3   < > 89.8 90.5 89.5 92.6 95.3  PLT 422*   < > 466* 444* 481* 475* 541*   < > = values in this interval not displayed.   Cardiac Enzymes: No results for input(s): CKTOTAL, CKMB, CKMBINDEX, TROPONINI in the last 168 hours. BNP: Invalid input(s): POCBNP CBG: No results for input(s): GLUCAP in the last 168 hours.  Time coordinating discharge: 50 minutes  Signed:  [facility] 03/05/2020, 2:09 PM

## 2020-03-05 NOTE — Discharge Instructions (Signed)
Sickle Cell Anemia, Adult  Sickle cell anemia is a condition where your red blood cells are shaped like sickles. Red blood cells carry oxygen through the body. Sickle-shaped cells do not live as long as normal red blood cells. They also clump together and block blood from flowing through the blood vessels. This prevents the body from getting enough oxygen. Sickle cell anemia causes organ damage and pain. It also increases the risk of infection. Follow these instructions at home: Medicines  Take over-the-counter and prescription medicines only as told by your doctor.  If you were prescribed an antibiotic medicine, take it as told by your doctor. Do not stop taking the antibiotic even if you start to feel better.  If you develop a fever, do not take medicines to lower the fever right away. Tell your doctor about the fever. Managing pain, stiffness, and swelling  Try these methods to help with pain: ? Use a heating pad. ? Take a warm bath. ? Distract yourself, such as by watching TV. Eating and drinking  Drink enough fluid to keep your pee (urine) clear or pale yellow. Drink more in hot weather and during exercise.  Limit or avoid alcohol.  Eat a healthy diet. Eat plenty of fruits, vegetables, whole grains, and lean protein.  Take vitamins and supplements as told by your doctor. Traveling  When traveling, keep these with you: ? Your medical information. ? The names of your doctors. ? Your medicines.  If you need to take an airplane, talk to your doctor first. Activity  Rest often.  Avoid exercises that make your heart beat much faster, such as jogging. General instructions  Do not use products that have nicotine or tobacco, such as cigarettes and e-cigarettes. If you need help quitting, ask your doctor.  Consider wearing a medical alert bracelet.  Avoid being in high places (high altitudes), such as mountains.  Avoid very hot or cold temperatures.  Avoid places where the  temperature changes a lot.  Keep all follow-up visits as told by your doctor. This is important. Contact a doctor if:  A joint hurts.  Your feet or hands hurt or swell.  You feel tired (fatigued). Get help right away if:  You have symptoms of infection. These include: ? Fever. ? Chills. ? Being very tired. ? Irritability. ? Poor eating. ? Throwing up (vomiting).  You feel dizzy or faint.  You have new stomach pain, especially on the left side.  You have a an erection (priapism) that lasts more than 4 hours.  You have numbness in your arms or legs.  You have a hard time moving your arms or legs.  You have trouble talking.  You have pain that does not go away when you take medicine.  You are short of breath.  You are breathing fast.  You have a long-term cough.  You have pain in your chest.  You have a bad headache.  You have a stiff neck.  Your stomach looks bloated even though you did not eat much.  Your skin is pale.  You suddenly cannot see well. Summary  Sickle cell anemia is a condition where your red blood cells are shaped like sickles.  Follow your doctor's advice on ways to manage pain, food to eat, activities to do, and steps to take for safe travel.  Get medical help right away if you have any signs of infection, such as a fever. This information is not intended to replace advice given to you by   your health care provider. Make sure you discuss any questions you have with your health care provider. Document Revised: 09/25/2018 Document Reviewed: 07/09/2016 Elsevier Patient Education  2020 Elsevier Inc.  

## 2020-03-16 ENCOUNTER — Institutional Professional Consult (permissible substitution): Payer: Medicare Other | Admitting: Internal Medicine

## 2020-03-31 ENCOUNTER — Encounter (HOSPITAL_COMMUNITY): Payer: Self-pay | Admitting: Family Medicine

## 2020-03-31 ENCOUNTER — Telehealth (HOSPITAL_COMMUNITY): Payer: Self-pay | Admitting: General Practice

## 2020-03-31 ENCOUNTER — Inpatient Hospital Stay (HOSPITAL_COMMUNITY)
Admission: AD | Admit: 2020-03-31 | Discharge: 2020-04-04 | DRG: 812 | Disposition: A | Discharge status: Home / Self Care | Payer: Medicare Other | Source: Ambulatory Visit | Attending: Internal Medicine | Admitting: Internal Medicine

## 2020-03-31 ENCOUNTER — Other Ambulatory Visit: Payer: Self-pay

## 2020-03-31 DIAGNOSIS — G894 Chronic pain syndrome: Secondary | ICD-10-CM | POA: Diagnosis present

## 2020-03-31 DIAGNOSIS — Z20822 Contact with and (suspected) exposure to covid-19: Secondary | ICD-10-CM | POA: Diagnosis present

## 2020-03-31 DIAGNOSIS — Z7901 Long term (current) use of anticoagulants: Secondary | ICD-10-CM | POA: Diagnosis not present

## 2020-03-31 DIAGNOSIS — Z9049 Acquired absence of other specified parts of digestive tract: Secondary | ICD-10-CM

## 2020-03-31 DIAGNOSIS — D5700 Hb-SS disease with crisis, unspecified: Principal | ICD-10-CM | POA: Diagnosis present

## 2020-03-31 DIAGNOSIS — D72829 Elevated white blood cell count, unspecified: Secondary | ICD-10-CM | POA: Diagnosis present

## 2020-03-31 DIAGNOSIS — Z881 Allergy status to other antibiotic agents status: Secondary | ICD-10-CM | POA: Diagnosis not present

## 2020-03-31 DIAGNOSIS — Z86711 Personal history of pulmonary embolism: Secondary | ICD-10-CM | POA: Diagnosis present

## 2020-03-31 DIAGNOSIS — F1120 Opioid dependence, uncomplicated: Secondary | ICD-10-CM | POA: Diagnosis present

## 2020-03-31 DIAGNOSIS — J4520 Mild intermittent asthma, uncomplicated: Secondary | ICD-10-CM

## 2020-03-31 DIAGNOSIS — Z888 Allergy status to other drugs, medicaments and biological substances status: Secondary | ICD-10-CM | POA: Diagnosis not present

## 2020-03-31 DIAGNOSIS — Z7951 Long term (current) use of inhaled steroids: Secondary | ICD-10-CM | POA: Diagnosis not present

## 2020-03-31 DIAGNOSIS — Z79899 Other long term (current) drug therapy: Secondary | ICD-10-CM

## 2020-03-31 LAB — RETICULOCYTES
Immature Retic Fract: 8.7 % (ref 2.3–15.9)
RBC.: 3.02 MIL/uL — ABNORMAL LOW (ref 3.87–5.11)
Retic Count, Absolute: 119.3 10*3/uL (ref 19.0–186.0)
Retic Ct Pct: 4 % — ABNORMAL HIGH (ref 0.4–3.1)

## 2020-03-31 LAB — CBC WITH DIFFERENTIAL/PLATELET
Abs Immature Granulocytes: 0.04 10*3/uL (ref 0.00–0.07)
Basophils Absolute: 0.1 10*3/uL (ref 0.0–0.1)
Basophils Relative: 1 %
Eosinophils Absolute: 0.3 10*3/uL (ref 0.0–0.5)
Eosinophils Relative: 2 %
HCT: 28.3 % — ABNORMAL LOW (ref 36.0–46.0)
Hemoglobin: 9 g/dL — ABNORMAL LOW (ref 12.0–15.0)
Immature Granulocytes: 0 %
Lymphocytes Relative: 16 %
Lymphs Abs: 1.8 10*3/uL (ref 0.7–4.0)
MCH: 29.6 pg (ref 26.0–34.0)
MCHC: 31.8 g/dL (ref 30.0–36.0)
MCV: 93.1 fL (ref 80.0–100.0)
Monocytes Absolute: 1.4 10*3/uL — ABNORMAL HIGH (ref 0.1–1.0)
Monocytes Relative: 12 %
Neutro Abs: 7.9 10*3/uL — ABNORMAL HIGH (ref 1.7–7.7)
Neutrophils Relative %: 69 %
Platelets: 581 10*3/uL — ABNORMAL HIGH (ref 150–400)
RBC: 3.04 MIL/uL — ABNORMAL LOW (ref 3.87–5.11)
RDW: 18.1 % — ABNORMAL HIGH (ref 11.5–15.5)
WBC: 11.5 10*3/uL — ABNORMAL HIGH (ref 4.0–10.5)
nRBC: 0 % (ref 0.0–0.2)

## 2020-03-31 LAB — COMPREHENSIVE METABOLIC PANEL
ALT: 23 U/L (ref 0–44)
AST: 40 U/L (ref 15–41)
Albumin: 4.3 g/dL (ref 3.5–5.0)
Alkaline Phosphatase: 52 U/L (ref 38–126)
Anion gap: 8 (ref 5–15)
BUN: 11 mg/dL (ref 6–20)
CO2: 21 mmol/L — ABNORMAL LOW (ref 22–32)
Calcium: 8.6 mg/dL — ABNORMAL LOW (ref 8.9–10.3)
Chloride: 107 mmol/L (ref 98–111)
Creatinine, Ser: 0.53 mg/dL (ref 0.44–1.00)
GFR, Estimated: >60 mL/min (ref >60)
Glucose, Bld: 94 mg/dL (ref 70–99)
Potassium: 3.8 mmol/L (ref 3.5–5.1)
Sodium: 136 mmol/L (ref 135–145)
Total Bilirubin: 2.7 mg/dL — ABNORMAL HIGH (ref 0.3–1.2)
Total Protein: 7.4 g/dL (ref 6.5–8.1)

## 2020-03-31 LAB — RESPIRATORY PANEL BY RT PCR (FLU A&B, COVID)
Influenza A by PCR: NEGATIVE
Influenza B by PCR: NEGATIVE
SARS Coronavirus 2 by RT PCR: NEGATIVE

## 2020-03-31 LAB — PREGNANCY, URINE: Preg Test, Ur: NEGATIVE

## 2020-03-31 MED ORDER — FOLIC ACID 1 MG PO TABS
1.0000 mg | ORAL_TABLET | Freq: Every day | ORAL | Status: DC
  Administered 2020-03-31 – 2020-04-04 (×5): 1 mg via ORAL
  Filled 2020-03-31 (×5): qty 1

## 2020-03-31 MED ORDER — DIPHENHYDRAMINE HCL 25 MG PO CAPS
25.0000 mg | ORAL_CAPSULE | ORAL | Status: DC | PRN
  Administered 2020-03-31: 25 mg via ORAL
  Filled 2020-03-31: qty 1

## 2020-03-31 MED ORDER — ACETAMINOPHEN 500 MG PO TABS
1000.0000 mg | ORAL_TABLET | Freq: Once | ORAL | Status: AC
  Administered 2020-03-31: 1000 mg via ORAL
  Filled 2020-03-31: qty 2

## 2020-03-31 MED ORDER — SODIUM CHLORIDE 0.9 % IV SOLN
25.0000 mg | INTRAVENOUS | Status: DC | PRN
  Filled 2020-03-31: qty 0.5

## 2020-03-31 MED ORDER — RIVAROXABAN 20 MG PO TABS
20.0000 mg | ORAL_TABLET | Freq: Every day | ORAL | Status: DC
  Administered 2020-03-31 – 2020-04-04 (×5): 20 mg via ORAL
  Filled 2020-03-31 (×5): qty 1

## 2020-03-31 MED ORDER — KETOROLAC TROMETHAMINE 15 MG/ML IJ SOLN
15.0000 mg | Freq: Four times a day (QID) | INTRAMUSCULAR | Status: DC
  Administered 2020-04-01 – 2020-04-04 (×16): 15 mg via INTRAVENOUS
  Filled 2020-03-31 (×16): qty 1

## 2020-03-31 MED ORDER — ONDANSETRON HCL 4 MG/2ML IJ SOLN: 4.0000 mg | Freq: Four times a day (QID) | INTRAMUSCULAR | Status: DC | PRN

## 2020-03-31 MED ORDER — ALBUTEROL SULFATE HFA 108 (90 BASE) MCG/ACT IN AERS
2.0000 | INHALATION_SPRAY | Freq: Two times a day (BID) | RESPIRATORY_TRACT | Status: DC | PRN
  Filled 2020-03-31: qty 6.7

## 2020-03-31 MED ORDER — MOMETASONE FURO-FORMOTEROL FUM 100-5 MCG/ACT IN AERO
2.0000 | INHALATION_SPRAY | Freq: Every day | RESPIRATORY_TRACT | Status: DC
  Administered 2020-04-01 – 2020-04-04 (×4): 2 via RESPIRATORY_TRACT
  Filled 2020-03-31: qty 8.8

## 2020-03-31 MED ORDER — SODIUM CHLORIDE 0.45 % IV SOLN: INTRAVENOUS | Status: AC

## 2020-03-31 MED ORDER — HYDROMORPHONE 1 MG/ML IV SOLN
INTRAVENOUS | Status: DC
  Administered 2020-03-31: 4 mg via INTRAVENOUS
  Administered 2020-03-31: 7.5 mg via INTRAVENOUS
  Administered 2020-03-31: 30 mg via INTRAVENOUS
  Administered 2020-04-01: 1 mg via INTRAVENOUS
  Administered 2020-04-01: 30 mg via INTRAVENOUS
  Administered 2020-04-01: 2 mg via INTRAVENOUS
  Administered 2020-04-01: 1.5 mg via INTRAVENOUS
  Administered 2020-04-01: 3.5 mg via INTRAVENOUS
  Filled 2020-03-31 (×2): qty 30

## 2020-03-31 MED ORDER — VITAMIN D 25 MCG (1000 UNIT) PO TABS
1000.0000 [IU] | ORAL_TABLET | Freq: Every day | ORAL | Status: DC
  Administered 2020-03-31 – 2020-04-04 (×5): 1000 [IU] via ORAL
  Filled 2020-03-31 (×5): qty 1

## 2020-03-31 MED ORDER — POLYETHYLENE GLYCOL 3350 17 G PO PACK: 17.0000 g | PACK | Freq: Every day | ORAL | Status: DC | PRN

## 2020-03-31 MED ORDER — GABAPENTIN 300 MG PO CAPS
300.0000 mg | ORAL_CAPSULE | Freq: Three times a day (TID) | ORAL | Status: DC
  Administered 2020-03-31 – 2020-04-04 (×13): 300 mg via ORAL
  Filled 2020-03-31 (×12): qty 1

## 2020-03-31 MED ORDER — PANTOPRAZOLE SODIUM 40 MG PO TBEC
40.0000 mg | DELAYED_RELEASE_TABLET | Freq: Every day | ORAL | Status: DC
  Administered 2020-03-31 – 2020-04-04 (×5): 40 mg via ORAL
  Filled 2020-03-31 (×5): qty 1

## 2020-03-31 MED ORDER — KETOROLAC TROMETHAMINE 30 MG/ML IJ SOLN
15.0000 mg | Freq: Once | INTRAMUSCULAR | Status: AC
  Administered 2020-03-31: 15 mg via INTRAVENOUS
  Filled 2020-03-31: qty 1

## 2020-03-31 MED ORDER — HEPARIN SOD (PORK) LOCK FLUSH 100 UNIT/ML IV SOLN
500.0000 [IU] | INTRAVENOUS | Status: DC | PRN
  Administered 2020-04-04: 500 [IU]
  Filled 2020-03-31: qty 5

## 2020-03-31 MED ORDER — SENNOSIDES-DOCUSATE SODIUM 8.6-50 MG PO TABS
1.0000 | ORAL_TABLET | Freq: Two times a day (BID) | ORAL | Status: DC
  Administered 2020-03-31 – 2020-04-04 (×8): 1 via ORAL
  Filled 2020-03-31 (×8): qty 1

## 2020-03-31 MED ORDER — SODIUM CHLORIDE 0.45 % IV SOLN: INTRAVENOUS | Status: DC

## 2020-03-31 MED ORDER — MIRTAZAPINE 30 MG PO TABS
45.0000 mg | ORAL_TABLET | Freq: Every day | ORAL | Status: DC
  Administered 2020-03-31 – 2020-04-03 (×4): 45 mg via ORAL
  Filled 2020-03-31 (×4): qty 1

## 2020-03-31 MED ORDER — PROMETHAZINE HCL 25 MG/ML IJ SOLN
12.5000 mg | Freq: Once | INTRAMUSCULAR | Status: AC
  Administered 2020-03-31: 12.5 mg via INTRAVENOUS
  Filled 2020-03-31: qty 1

## 2020-03-31 MED ORDER — SODIUM CHLORIDE 0.9% FLUSH: 9.0000 mL | INTRAVENOUS | Status: DC | PRN

## 2020-03-31 MED ORDER — DEFERASIROX 360 MG PO TABS: 1080.0000 mg | ORAL_TABLET | Freq: Every day | ORAL | Status: DC

## 2020-03-31 MED ORDER — SODIUM CHLORIDE 0.9% FLUSH: 10.0000 mL | INTRAVENOUS | Status: DC | PRN

## 2020-03-31 MED ORDER — NALOXONE HCL 0.4 MG/ML IJ SOLN: 0.4000 mg | INTRAMUSCULAR | Status: DC | PRN

## 2020-03-31 NOTE — H&P (Signed)
[facility] History and Physical   Date: 03/31/2020  Patient name: Molly Gutierrez Medical record number: [id number] Date of birth: 03-01-1981 Age: 39 y.o. Gender: female PCP: Vevelyn Francois, NP  Attending physician: [name], MD  Chief Complaint: Sickle cell pain   History of Present Illness: Christol Thetford is a 39 year old female with a medical history significant for sickle cell disease, chronic pain syndrome, opiate dependence and tolerance, history of pulmonary embolism, and history of mild intermittent presents with mid chest and low back pain that is consistent with her typical pain crisis.  Patient was seen at [facility] emergency department on yesterday for this problem with minimal relief.  She says that pain was somewhat controlled at emergency room discharge.  She last had Dilaudid 2 mg this a.m. without sustained relief.  Pain intensity is 9-10 characterized as constant and sharp.  She attributes pain crisis to changes in weather.  She denies fever, chills, headache, urinary symptoms, nausea, vomiting, or diarrhea.  No sick contacts, recent travel, or exposure to COVID-19.  Meds: Medications Prior to Admission  Medication Sig Dispense Refill Last Dose  . albuterol (VENTOLIN HFA) 108 (90 Base) MCG/ACT inhaler Inhale 2 puffs into the lungs 2 (two) times daily as needed for wheezing or shortness of breath.     . celecoxib (CELEBREX) 200 MG capsule Take 200 mg by mouth daily.      . Cholecalciferol (VITAMIN D3) 25 MCG (1000 UT) CAPS Take 1,000 Units by mouth daily.     . Deferasirox (JADENU) 360 MG TABS Take 1,080 mg by mouth daily before breakfast.     . diphenhydrAMINE (BENADRYL) 25 mg capsule Take 25 mg by mouth 3 (three) times daily as needed for itching.     . folic acid (FOLVITE) 1 MG tablet Take 1 mg by mouth daily.     Marland Kitchen gabapentin (NEURONTIN) 300 MG capsule Take 300 mg by mouth 3 (three) times daily.     Marland Kitchen HYDROmorphone  (DILAUDID) 4 MG tablet Take 4 mg by mouth every 6 (six) hours as needed for moderate pain or severe pain.      . mirtazapine (REMERON) 15 MG tablet Take 45 mg by mouth at bedtime.      . mometasone-formoterol (DULERA) 100-5 MCG/ACT AERO Inhale 2 puffs into the lungs daily as needed for wheezing or shortness of breath.     Marland Kitchen omeprazole (PRILOSEC) 20 MG capsule Take 20 mg by mouth 2 (two) times a day.     Marland Kitchen PROMETHAZINE HCL PO Take 1 tablet by mouth every 6 (six) hours as needed.     . vitamin B-12 (CYANOCOBALAMIN) 1000 MCG tablet Take 1,000 mcg by mouth daily.     Alveda Reasons 20 MG TABS tablet Take 1 tablet (20 mg total) by mouth daily. (Patient taking differently: Take 20 mg by mouth daily with supper. ) 30 tablet 1     Allergies: Cefaclor, Hydroxyurea, and Ketamine Past Medical History:  Diagnosis Date  . Asthma   . Eczema   . History of pulmonary embolus (PE)   . Sickle cell anemia (HCC)    Past Surgical History:  Procedure Laterality Date  . CHOLECYSTECTOMY    . ERCP    . JOINT REPLACEMENT    . PORTA CATH INSERTION    . TUBAL LIGATION    . WISDOM TOOTH EXTRACTION     Family History  Problem Relation Age of Onset  . Renal Disease Mother   .  Hypertension Mother   . High Cholesterol Mother    Social History   Socioeconomic History  . Marital status: Single    Spouse name: Not on file  . Number of children: Not on file  . Years of education: Not on file  . Highest education level: Not on file  Occupational History  . Not on file  Tobacco Use  . Smoking status: Never Smoker  . Smokeless tobacco: Never Used  Vaping Use  . Vaping Use: Never used  Substance and Sexual Activity  . Alcohol use: Never  . Drug use: Never  . Sexual activity: Not on file  Other Topics Concern  . Not on file  Social History Narrative  . Not on file   Social Determinants of Health   Financial Resource Strain:   . Difficulty of Paying Living Expenses: Not on file  Food Insecurity:   .  Worried About Charity fundraiser in the Last Year: Not on file  . Ran Out of Food in the Last Year: Not on file  Transportation Needs:   . Lack of Transportation (Medical): Not on file  . Lack of Transportation (Non-Medical): Not on file  Physical Activity:   . Days of Exercise per Week: Not on file  . Minutes of Exercise per Session: Not on file  Stress:   . Feeling of Stress : Not on file  Social Connections:   . Frequency of Communication with Friends and Family: Not on file  . Frequency of Social Gatherings with Friends and Family: Not on file  . Attends Religious Services: Not on file  . Active Member of Clubs or Organizations: Not on file  . Attends Archivist Meetings: Not on file  . Marital Status: Not on file  Intimate Partner Violence:   . Fear of Current or Ex-Partner: Not on file  . Emotionally Abused: Not on file  . Physically Abused: Not on file  . Sexually Abused: Not on file   Review of Systems  Constitutional: Negative for chills and fever.  HENT: Negative.   Eyes: Negative.   Respiratory: Negative.   Cardiovascular: Negative.   Gastrointestinal: Negative.   Genitourinary: Negative.   Musculoskeletal: Positive for back pain and joint pain.  Skin: Negative.   Neurological: Negative.   Psychiatric/Behavioral: Negative.      Physical Exam: There were no vitals taken for this visit.  Physical Exam Constitutional:      Appearance: Normal appearance.  Eyes:     Pupils: Pupils are equal, round, and reactive to light.  Cardiovascular:     Rate and Rhythm: Normal rate and regular rhythm.     Pulses: Normal pulses.  Pulmonary:     Effort: Pulmonary effort is normal.  Abdominal:     General: Abdomen is flat. Bowel sounds are normal.  Musculoskeletal:        General: Normal range of motion.  Skin:    General: Skin is warm.  Neurological:     General: No focal deficit present.     Mental Status: She is alert. Mental status is at baseline.   Psychiatric:        Mood and Affect: Mood normal.        Thought Content: Thought content normal.        Judgment: Judgment normal.     Lab results: No results found for this or any previous visit (from the past 24 hour(s)).  Imaging results:  No results found.   Assessment &  Plan: Patient admitted to sickle cell day infusion center for management of pain crisis.  Patient is opiate tolerant Initiate IV dilaudid PCA. Settings of 0.5 mg, 10 minute lockout, and 3 mg/hr IV fluids, 0.45% saline at 100 ml/hr Toradol 15 mg IV times one dose Tylenol 1000 mg by mouth times one dose Review CBC with differential, complete metabolic panel, and reticulocytes as results become available. Baseline hemoglobin is 9-10 Pain intensity will be reevaluated in context of functioning and relationship to baseline as care progress If pain intensity remains elevated and/or sudden change in hemodynamic stability transition to inpatient services for higher level of care.    [name]  APRN, MSN, FNP-C [facility] [address] [place], [place] [address] [phone]   03/31/2020, 10:51 AM

## 2020-03-31 NOTE — H&P (Signed)
H&P  Patient Demographics:  Molly Gutierrez, is a 39 y.o. female  MRN: [id number]   DOB - 06-Aug-1980  Admit Date - 03/31/2020  Outpatient Primary MD for the patient is Vevelyn Francois, NP      HPI:   Molly Gutierrez  is a 39 y.o. female with a medical history significant for sickle cell disease, chronic pain syndrome, opiate dependence and tolerance, history of pulmonary embolism on Xarelto, and history of mild intermittent asthma presented to sickle cell day infusion clinic with complaints of mid chest and low back pain that is consistent with her typical pain crisis. Patient attributes pain crisis to changes in weather. She was treated and evaluated at [facility] emergency department on yesterday for this problem. Patient says that pain was somewhat relieved at discharge but was not sustained. Home opiate medication includes Dilaudid, and patient says that she has been taking consistently. Current pain intensity is 9/10 characterized as constant and sharp. Patient denies any fever, chills, headache, urinary symptoms. No nausea, vomiting, or diarrhea. No dizziness, persistent cough, or shortness of breath. No sick contacts, recent travel, or exposure to COVID-19.  Sickle cell day clinic course: Vital signs show: BP 110/78 (BP Location: Right Arm)   Pulse 82   Temp 98.4 F (36.9 C) (Temporal)   Resp 11   LMP 03/11/2020   SpO2 98% . Hemoglobin 9.0, appears to be slightly above baseline. WBCs 11.5, chronically elevated. COVID-19 test pending. All other laboratory values are largely within normal limits. Patient's pain persists despite IV Dilaudid PCA, IV fluids, and IV Toradol. Admit to [facility] for further management of sickle cell pain crisis.       Review of systems:  In addition to the HPI above, patient reports No fever or chills No Headache, No changes with vision or hearing No problems swallowing food or liquids No chest pain, cough or shortness of  breath No abdominal pain, No nausea or vomiting, Bowel movements are regular No blood in stool or urine No dysuria No new skin rashes or bruises No new joints pains-aches No new weakness, tingling, numbness in any extremity No recent weight gain or loss No polyuria, polydypsia or polyphagia No significant Mental Stressors  A full 10 point Review of Systems was done, except as stated above, all other Review of Systems were negative.  With Past History of the following :   Past Medical History:  Diagnosis Date  . Asthma   . Eczema   . History of pulmonary embolus (PE)   . Sickle cell anemia (HCC)       Past Surgical History:  Procedure Laterality Date  . CHOLECYSTECTOMY    . ERCP    . JOINT REPLACEMENT    . PORTA CATH INSERTION    . TUBAL LIGATION    . WISDOM TOOTH EXTRACTION       Social History:   Social History   Tobacco Use  . Smoking status: Never Smoker  . Smokeless tobacco: Never Used  Substance Use Topics  . Alcohol use: Never     Lives - At home   Family History :   Family History  Problem Relation Age of Onset  . Renal Disease Mother   . Hypertension Mother   . High Cholesterol Mother      Home Medications:   Prior to Admission medications   Medication Sig Start Date End Date Taking? Authorizing Provider  Cholecalciferol (VITAMIN D3) 25 MCG (1000 UT) CAPS Take 1,000 Units by mouth  daily.   Yes [provider]  Deferasirox (JADENU) 360 MG TABS Take 1,080 mg by mouth daily before breakfast. 08/08/19 08/07/20 Yes [provider]  diphenhydrAMINE (BENADRYL) 25 mg capsule Take 25 mg by mouth 3 (three) times daily as needed for itching.   Yes [provider]  folic acid (FOLVITE) 1 MG tablet Take 1 mg by mouth daily. 12/31/16  Yes [provider]  HYDROmorphone (DILAUDID) 4 MG tablet Take 4 mg by mouth every 6 (six) hours as needed for moderate pain or severe pain.  08/20/19  Yes [provider]  mirtazapine  (REMERON) 15 MG tablet Take 45 mg by mouth at bedtime.  08/08/19  Yes [provider]  vitamin B-12 (CYANOCOBALAMIN) 1000 MCG tablet Take 1,000 mcg by mouth daily.   Yes [provider]  XARELTO 20 MG TABS tablet Take 1 tablet (20 mg total) by mouth daily. Patient taking differently: Take 20 mg by mouth daily with supper.  05/14/19  Yes [name], MD  albuterol (VENTOLIN HFA) 108 (90 Base) MCG/ACT inhaler Inhale 2 puffs into the lungs 2 (two) times daily as needed for wheezing or shortness of breath. 12/31/16   [provider]  celecoxib (CELEBREX) 200 MG capsule Take 200 mg by mouth daily.  10/14/19   [provider]  gabapentin (NEURONTIN) 300 MG capsule Take 300 mg by mouth 3 (three) times daily. 10/13/19   [provider]  mometasone-formoterol (DULERA) 100-5 MCG/ACT AERO Inhale 2 puffs into the lungs daily as needed for wheezing or shortness of breath.    [provider]  omeprazole (PRILOSEC) 20 MG capsule Take 20 mg by mouth 2 (two) times a day. 09/08/18   [provider]  PROMETHAZINE HCL PO Take 1 tablet by mouth every 6 (six) hours as needed.    [provider]     Allergies:   Allergies  Allergen Reactions  . Cefaclor Hives and Swelling  . Hydroxyurea Palpitations and Other (See Comments)    Lowers "blood levels" and heart rate (causes HYPOtension); "it messes me up, it drops my levels and stuff"  Other reaction(s): Hypotension, Other (See Comments) "it messes me up, it drops my levels and stuff" Lower blood levels and HR  Other reaction(s): Other (See Comments) Lowers "blood levels" and heart rate (causes HYPOtension); "it messes me up, it drops my levels and stuff"  . Ketamine Palpitations and Other (See Comments)    "Pt states she has had previous reaction to ketamine. States she becomes flushed, heart races, dizzy, and feels like she is going to pass out." Other reaction(s): Other (See  Comments), Other (See Comments) "Pt states she has had previous reaction to ketamine. States she becomes flushed, heart races, dizzy, and feels like she is going to pass out." "Pt states she has had previous reaction to ketamine. States she becomes flushed, heart races, dizzy, and feels like she is going to pass out."     Physical Exam:   Vitals:   Vitals:   03/31/20 1200 03/31/20 1415  BP: 112/74 110/78  Pulse: 75 82  Resp: 12 11  Temp:    SpO2: 98% 98%    Physical Exam: Constitutional: Patient appears well-developed and well-nourished. Not in obvious distress. HENT: Normocephalic, atraumatic, External right and left ear normal. Oropharynx is clear and moist.  Eyes: Conjunctivae and EOM are normal. PERRLA, no scleral icterus. Neck: Normal ROM. Neck supple. No JVD. No tracheal deviation. No thyromegaly. CVS: RRR, S1/S2 +, no  murmurs, no gallops, no carotid bruit.  Pulmonary: Effort and breath sounds normal, no stridor, rhonchi, wheezes, rales.  Abdominal: Soft. BS +, no distension, tenderness, rebound or guarding.  Musculoskeletal: Normal range of motion. No edema and no tenderness.  Lymphadenopathy: No lymphadenopathy noted, cervical, inguinal or axillary Neuro: Alert. Normal reflexes, muscle tone coordination. No cranial nerve deficit. Skin: Skin is warm and dry. No rash noted. Not diaphoretic. No erythema. No pallor. Psychiatric: Normal mood and affect. Behavior, judgment, thought content normal.   Data Review:   CBC Recent Labs  Lab 03/31/20 1055  WBC 11.5*  HGB 9.0*  HCT 28.3*  PLT 581*  MCV 93.1  MCH 29.6  MCHC 31.8  RDW 18.1*  LYMPHSABS 1.8  MONOABS 1.4*  EOSABS 0.3  BASOSABS 0.1   ------------------------------------------------------------------------------------------------------------------  Chemistries  Recent Labs  Lab 03/31/20 1055  NA 136  K 3.8  CL 107  CO2 21*  GLUCOSE 94  BUN 11  CREATININE 0.53  CALCIUM 8.6*  AST 40  ALT 23   ALKPHOS 52  BILITOT 2.7*   ------------------------------------------------------------------------------------------------------------------ CrCl cannot be calculated (Unknown ideal weight.). ------------------------------------------------------------------------------------------------------------------ No results for input(s): TSH, T4TOTAL, T3FREE, THYROIDAB in the last 72 hours.  Invalid input(s): FREET3  Coagulation profile No results for input(s): INR, PROTIME in the last 168 hours. ------------------------------------------------------------------------------------------------------------------- No results for input(s): DDIMER in the last 72 hours. -------------------------------------------------------------------------------------------------------------------  Cardiac Enzymes No results for input(s): CKMB, TROPONINI, MYOGLOBIN in the last 168 hours.  Invalid input(s): CK ------------------------------------------------------------------------------------------------------------------    Component Value Date/Time   BNP 70.6 05/05/2019 0204    ---------------------------------------------------------------------------------------------------------------  Urinalysis    Component Value Date/Time   COLORURINE YELLOW 08/14/2019 1800   APPEARANCEUR CLEAR 08/14/2019 1800   LABSPEC 1.011 08/14/2019 1800   PHURINE 7.0 08/14/2019 1800   GLUCOSEU NEGATIVE 08/14/2019 1800   HGBUR SMALL (A) 08/14/2019 1800   BILIRUBINUR NEGATIVE 08/14/2019 1800   BILIRUBINUR small 07/09/2019 1440   KETONESUR NEGATIVE 08/14/2019 1800   PROTEINUR 30 (A) 08/14/2019 1800   UROBILINOGEN 1.0 07/09/2019 1440   NITRITE NEGATIVE 08/14/2019 1800   LEUKOCYTESUR NEGATIVE 08/14/2019 1800    ----------------------------------------------------------------------------------------------------------------   Imaging Results:    No results found.   Assessment & Plan:  Principal Problem:   Sickle  cell pain crisis ([place]) Active Problems:   Leukocytosis   History of pulmonary embolism   Opioid dependence (HCC)   Mild intermittent asthma   Hb Sickle Cell Disease with crisis: Pain persists despite IV Dilaudid PCA. Continue with settings of 0.5 mg, 10-minute lockout, and 3 mg/h. Toradol 15 mg IV every 6 hours for total of 5 days Hold home Dilaudid, use PCA Dilaudid Monitor vital signs very closely, reevaluate pain scale regularly, and supplemental oxygen as needed. Patient will be reevaluated for pain in the context of function and relationship to baseline as care progresses.  Sickle cell anemia: Hemoglobin 9.0, slightly above patient's baseline. There is no clinical indication for blood transfusion at this time. Continue to follow closely. Folic acid 1 mg daily  Chronic pain syndrome: Hold oral Dilaudid, use PCA  Leukocytosis: WBCs mildly elevated. More than likely secondary to vaso-occlusive pain crisis. Continue to follow closely. Without antibiotics. CBC in a.m.  History of pulmonary embolism: Continue Xarelto  History of mild intermittent asthma: Stable. Well-controlled. Continue home medications.   DVT Prophylaxis: Continue Xarelto and SCDs  AM Labs Ordered, also please review Full Orders  Family Communication: Admission, patient's condition and plan of care including tests being ordered have been discussed  with the patient who indicate understanding and agree with the plan and Code Status.  Code Status: Full Code  Consults called: None    Admission status: Inpatient    Time spent in minutes : 35 minutes  [place], MSN, FNP-C [facility] [address] [place], [place] [address] [phone]  03/31/2020 at 3:18 PM

## 2020-03-31 NOTE — Progress Notes (Signed)
Patient admitted to the day hospital for treatment of sickle cell pain crisis. Patient reported pain rated 9/10 in the back and chest (chest is "typical pain"). Patient placed on Dilaudid PCA, given IV toradol, PO benadryl, PO tylenol and hydrated with IV fluids. Transferred to [facility]. Report given to [facility]. On transfer, reported  pain was at 7/10. Alert, oriented and transported in a wheelchair.

## 2020-03-31 NOTE — Discharge Instructions (Signed)
Sickle Cell Anemia, Adult  Sickle cell anemia is a condition where your red blood cells are shaped like sickles. Red blood cells carry oxygen through the body. Sickle-shaped cells do not live as long as normal red blood cells. They also clump together and block blood from flowing through the blood vessels. This prevents the body from getting enough oxygen. Sickle cell anemia causes organ damage and pain. It also increases the risk of infection. Follow these instructions at home: Medicines  Take over-the-counter and prescription medicines only as told by your doctor.  If you were prescribed an antibiotic medicine, take it as told by your doctor. Do not stop taking the antibiotic even if you start to feel better.  If you develop a fever, do not take medicines to lower the fever right away. Tell your doctor about the fever. Managing pain, stiffness, and swelling  Try these methods to help with pain: ? Use a heating pad. ? Take a warm bath. ? Distract yourself, such as by watching TV. Eating and drinking  Drink enough fluid to keep your pee (urine) clear or pale yellow. Drink more in hot weather and during exercise.  Limit or avoid alcohol.  Eat a healthy diet. Eat plenty of fruits, vegetables, whole grains, and lean protein.  Take vitamins and supplements as told by your doctor. Traveling  When traveling, keep these with you: ? Your medical information. ? The names of your doctors. ? Your medicines.  If you need to take an airplane, talk to your doctor first. Activity  Rest often.  Avoid exercises that make your heart beat much faster, such as jogging. General instructions  Do not use products that have nicotine or tobacco, such as cigarettes and e-cigarettes. If you need help quitting, ask your doctor.  Consider wearing a medical alert bracelet.  Avoid being in high places (high altitudes), such as mountains.  Avoid very hot or cold temperatures.  Avoid places where the  temperature changes a lot.  Keep all follow-up visits as told by your doctor. This is important. Contact a doctor if:  A joint hurts.  Your feet or hands hurt or swell.  You feel tired (fatigued). Get help right away if:  You have symptoms of infection. These include: ? Fever. ? Chills. ? Being very tired. ? Irritability. ? Poor eating. ? Throwing up (vomiting).  You feel dizzy or faint.  You have new stomach pain, especially on the left side.  You have a an erection (priapism) that lasts more than 4 hours.  You have numbness in your arms or legs.  You have a hard time moving your arms or legs.  You have trouble talking.  You have pain that does not go away when you take medicine.  You are short of breath.  You are breathing fast.  You have a long-term cough.  You have pain in your chest.  You have a bad headache.  You have a stiff neck.  Your stomach looks bloated even though you did not eat much.  Your skin is pale.  You suddenly cannot see well. Summary  Sickle cell anemia is a condition where your red blood cells are shaped like sickles.  Follow your doctor's advice on ways to manage pain, food to eat, activities to do, and steps to take for safe travel.  Get medical help right away if you have any signs of infection, such as a fever. This information is not intended to replace advice given to you by   your health care provider. Make sure you discuss any questions you have with your health care provider. Document Revised: 09/25/2018 Document Reviewed: 07/09/2016 Elsevier Patient Education  2020 Elsevier Inc.  

## 2020-03-31 NOTE — Telephone Encounter (Signed)
Patient called, requesting to come to the day hospital due to pain in the back and chest ("chest pain typical" when in crisis)    rated at 9 /10. Denied fever, diarrhea, abdominal pain, nausea and vomitting. Screened negative for Covid-19 symptoms. Admitted to having means of transportation ("brother")  without driving self after treatment. Last took 4 mg of dilaudid at 22:00 yesterday. Per provider, patient can come to the day hospital for treatment. Patient notified, verbalized understanding.

## 2020-04-01 DIAGNOSIS — D72829 Elevated white blood cell count, unspecified: Secondary | ICD-10-CM

## 2020-04-01 DIAGNOSIS — Z86711 Personal history of pulmonary embolism: Secondary | ICD-10-CM | POA: Diagnosis not present

## 2020-04-01 DIAGNOSIS — D5700 Hb-SS disease with crisis, unspecified: Secondary | ICD-10-CM | POA: Diagnosis not present

## 2020-04-01 DIAGNOSIS — J4520 Mild intermittent asthma, uncomplicated: Secondary | ICD-10-CM | POA: Diagnosis not present

## 2020-04-01 LAB — CBC
HCT: 25.4 % — ABNORMAL LOW (ref 36.0–46.0)
Hemoglobin: 8.2 g/dL — ABNORMAL LOW (ref 12.0–15.0)
MCH: 30 pg (ref 26.0–34.0)
MCHC: 32.3 g/dL (ref 30.0–36.0)
MCV: 93 fL (ref 80.0–100.0)
Platelets: 528 10*3/uL — ABNORMAL HIGH (ref 150–400)
RBC: 2.73 MIL/uL — ABNORMAL LOW (ref 3.87–5.11)
RDW: 18 % — ABNORMAL HIGH (ref 11.5–15.5)
WBC: 11 10*3/uL — ABNORMAL HIGH (ref 4.0–10.5)
nRBC: 0.2 % (ref 0.0–0.2)

## 2020-04-01 LAB — BASIC METABOLIC PANEL
Anion gap: 6 (ref 5–15)
BUN: 12 mg/dL (ref 6–20)
CO2: 24 mmol/L (ref 22–32)
Calcium: 8.5 mg/dL — ABNORMAL LOW (ref 8.9–10.3)
Chloride: 108 mmol/L (ref 98–111)
Creatinine, Ser: 0.47 mg/dL (ref 0.44–1.00)
GFR, Estimated: >60 mL/min (ref >60)
Glucose, Bld: 103 mg/dL — ABNORMAL HIGH (ref 70–99)
Potassium: 3.6 mmol/L (ref 3.5–5.1)
Sodium: 138 mmol/L (ref 135–145)

## 2020-04-01 MED ORDER — SODIUM CHLORIDE 0.9% FLUSH: 9.0000 mL | INTRAVENOUS | Status: DC | PRN

## 2020-04-01 MED ORDER — CHLORHEXIDINE GLUCONATE CLOTH 2 % EX PADS
6.0000 | MEDICATED_PAD | Freq: Every day | CUTANEOUS | Status: DC
  Administered 2020-04-01 – 2020-04-03 (×2): 6 via TOPICAL

## 2020-04-01 MED ORDER — PROMETHAZINE HCL 25 MG PO TABS
25.0000 mg | ORAL_TABLET | Freq: Four times a day (QID) | ORAL | Status: DC | PRN
  Administered 2020-04-01 – 2020-04-02 (×2): 25 mg via ORAL
  Filled 2020-04-01 (×3): qty 1

## 2020-04-01 MED ORDER — NALOXONE HCL 0.4 MG/ML IJ SOLN: 0.4000 mg | INTRAMUSCULAR | Status: DC | PRN

## 2020-04-01 MED ORDER — HYDROMORPHONE 1 MG/ML IV SOLN
INTRAVENOUS | Status: DC
  Administered 2020-04-01: 4 mg via INTRAVENOUS
  Administered 2020-04-02: 3 mg via INTRAVENOUS
  Administered 2020-04-02: 30 mg via INTRAVENOUS
  Administered 2020-04-02 (×2): 3 mg via INTRAVENOUS
  Administered 2020-04-02: 4 mg via INTRAVENOUS
  Administered 2020-04-02: 7 mg via INTRAVENOUS
  Administered 2020-04-02: 1.5 mg via INTRAVENOUS
  Administered 2020-04-03 (×2): 1 mg via INTRAVENOUS
  Administered 2020-04-03: 0.5 mg via INTRAVENOUS
  Filled 2020-04-01: qty 30

## 2020-04-01 NOTE — Progress Notes (Signed)
Patient ID: Molly Gutierrez, female   DOB: 30-Nov-1980, 39 y.o.   MRN: [id number] Subjective: Molly Gutierrez is a 39 year old femalewith a medical history significant for sickle cell disease, chronic pain syndrome, opiate dependence and tolerance, and history of mild intermittent asthma was admitted for sickle cell pain crisis.  Patient claims she is still in significant pain, she rates her pain at 8/10. She said her pain is localized to her lower back and lower extremities. She denies any chest pain or cough, no fever, no headache, no shortness of breath. She denies any nausea, vomiting or diarrhea. No urinary symptoms.  Objective:  Vital signs in last 24 hours:  Vitals:   04/01/20 0849 04/01/20 0854 04/01/20 0900 04/01/20 1150  BP:   108/71 108/79  Pulse:   98 (!) 102  Resp: 16     Temp:   98.7 F (37.1 C) 99.7 F (37.6 C)  TempSrc:   Oral Oral  SpO2: 98% 92% 97% 97%   Intake/Output from previous day:   Intake/Output Summary (Last 24 hours) at 04/01/2020 1249 Last data filed at 04/01/2020 0400 Gross per 24 hour  Intake 1063.33 ml  Output --  Net 1063.33 ml   Physical Exam: General: Alert, awake, oriented x3, in no acute distress.  HEENT: [place]/AT PEERL, EOMI Neck: Trachea midline,  no masses, no thyromegal,y no JVD, no carotid bruit OROPHARYNX:  Moist, No exudate/ erythema/lesions.  Heart: Regular rate and rhythm, without murmurs, rubs, gallops, PMI non-displaced, no heaves or thrills on palpation.  Lungs: Clear to auscultation, no wheezing or rhonchi noted. No increased vocal fremitus resonant to percussion  Abdomen: Soft, nontender, nondistended, positive bowel sounds, no masses no hepatosplenomegaly noted..  Neuro: No focal neurological deficits noted cranial nerves II through XII grossly intact. DTRs 2+ bilaterally upper and lower extremities. Strength 5 out of 5 in bilateral upper and lower extremities. Musculoskeletal: No warm swelling or erythema around joints,  no spinal tenderness noted. Psychiatric: Patient alert and oriented x3, good insight and cognition, good recent to remote recall. Lymph node survey: No cervical axillary or inguinal lymphadenopathy noted.  Lab Results:  Basic Metabolic Panel:    Component Value Date/Time   NA 138 04/01/2020 0440   K 3.6 04/01/2020 0440   CL 108 04/01/2020 0440   CO2 24 04/01/2020 0440   BUN 12 04/01/2020 0440   CREATININE 0.47 04/01/2020 0440   GLUCOSE 103 (H) 04/01/2020 0440   CALCIUM 8.5 (L) 04/01/2020 0440   CBC:    Component Value Date/Time   WBC 11.0 (H) 04/01/2020 0440   HGB 8.2 (L) 04/01/2020 0440   HCT 25.4 (L) 04/01/2020 0440   PLT 528 (H) 04/01/2020 0440   MCV 93.0 04/01/2020 0440   NEUTROABS 7.9 (H) 03/31/2020 1055   LYMPHSABS 1.8 03/31/2020 1055   MONOABS 1.4 (H) 03/31/2020 1055   EOSABS 0.3 03/31/2020 1055   BASOSABS 0.1 03/31/2020 1055    Recent Results (from the past 240 hour(s))  Respiratory Panel by RT PCR (Flu A&B, Covid) - Nasopharyngeal Swab     Status: None   Collection Time: 03/31/20  4:26 PM   Specimen: Nasopharyngeal Swab  Result Value Ref Range Status   SARS Coronavirus 2 by RT PCR NEGATIVE NEGATIVE Final    Comment: (NOTE) SARS-CoV-2 target nucleic acids are NOT DETECTED.  The SARS-CoV-2 RNA is generally detectable in upper respiratoy specimens during the acute phase of infection. The lowest concentration of SARS-CoV-2 viral copies this assay can detect is 131 copies/mL.  A negative result does not preclude SARS-Cov-2 infection and should not be used as the sole basis for treatment or other patient management decisions. A negative result may occur with  improper specimen collection/handling, submission of specimen other than nasopharyngeal swab, presence of viral mutation(s) within the areas targeted by this assay, and inadequate number of viral copies (<131 copies/mL). A negative result must be combined with clinical observations, patient history, and  epidemiological information. The expected result is Negative.  Fact Sheet for Patients:  PinkCheek.be  Fact Sheet for Healthcare Providers:  GravelBags.it  This test is no t yet approved or cleared by the [place] FDA and  has been authorized for detection and/or diagnosis of SARS-CoV-2 by FDA under an Emergency Use Authorization (EUA). This EUA will remain  in effect (meaning this test can be used) for the duration of the COVID-19 declaration under Section 564(b)(1) of the Act, 21 U.S.C. section 360bbb-3(b)(1), unless the authorization is terminated or revoked sooner.     Influenza A by PCR NEGATIVE NEGATIVE Final   Influenza B by PCR NEGATIVE NEGATIVE Final    Comment: (NOTE) The Xpert Xpress SARS-CoV-2/FLU/RSV assay is intended as an aid in  the diagnosis of influenza from Nasopharyngeal swab specimens and  should not be used as a sole basis for treatment. Nasal washings and  aspirates are unacceptable for Xpert Xpress SARS-CoV-2/FLU/RSV  testing.  Fact Sheet for Patients: PinkCheek.be  Fact Sheet for Healthcare Providers: GravelBags.it  This test is not yet approved or cleared by the [place] FDA and  has been authorized for detection and/or diagnosis of SARS-CoV-2 by  FDA under an Emergency Use Authorization (EUA). This EUA will remain  in effect (meaning this test can be used) for the duration of the  Covid-19 declaration under Section 564(b)(1) of the Act, 21  U.S.C. section 360bbb-3(b)(1), unless the authorization is  terminated or revoked. Performed at [facility], [place] [address] [place], [place] [address]     Studies/Results: No results found.  Medications: Scheduled Meds: . Chlorhexidine Gluconate Cloth  6 each Topical Daily  . cholecalciferol  1,000 Units Oral Daily  . Deferasirox  1,080 mg Oral QAC breakfast   . folic acid  1 mg Oral Daily  . gabapentin  300 mg Oral TID  . HYDROmorphone   Intravenous Q4H  . ketorolac  15 mg Intravenous Q6H  . mirtazapine  45 mg Oral QHS  . mometasone-formoterol  2 puff Inhalation Daily  . pantoprazole  40 mg Oral Daily  . rivaroxaban  20 mg Oral Q supper  . senna-docusate  1 tablet Oral BID   Continuous Infusions: . sodium chloride 100 mL/hr at 03/31/20 2031  . diphenhydrAMINE     PRN Meds:.albuterol, diphenhydrAMINE **OR** diphenhydrAMINE, heparin lock flush, naloxone **AND** sodium chloride flush, ondansetron (ZOFRAN) IV, polyethylene glycol, sodium chloride flush  Consultants:  None  Procedures:  None  Antibiotics:  None  Assessment/Plan: Principal Problem:   Sickle cell pain crisis ([place]) Active Problems:   Leukocytosis   History of pulmonary embolism   Opioid dependence (HCC)   Mild intermittent asthma  1. Hb Sickle Cell Disease with crisis: Continue IVF D5 .45% Saline @ 125 mls/hour, continue weight based Dilaudid PCA, continue IV Toradol 15 mg Q 6 H, Monitor vitals very closely, Re-evaluate pain scale regularly, 2 L of Oxygen by [place]. 2. Leukocytosis: Mildly elevated. No fever or any other evidence suggesting infection or inflammation. No clinical indication for antibiotics for now. 3. Sickle Cell  Anemia: Hemoglobin is stable at baseline, no clinical indication for blood transfusion today. Continue to monitor. 4. Chronic pain Syndrome: Hold home Dilaudid, patient is on Dilaudid via PCA. Continue. 5. History of Pulmonary Embolism: Continue Xarelto.  Code Status: Full Code Family Communication: N/A Disposition Plan: Not yet ready for discharge  Amaro Mangold  If 7PM-7AM, please contact night-coverage.  04/01/2020, 12:49 PM  LOS: 1 day

## 2020-04-02 DIAGNOSIS — J4520 Mild intermittent asthma, uncomplicated: Secondary | ICD-10-CM | POA: Diagnosis not present

## 2020-04-02 DIAGNOSIS — D72829 Elevated white blood cell count, unspecified: Secondary | ICD-10-CM | POA: Diagnosis not present

## 2020-04-02 DIAGNOSIS — D5700 Hb-SS disease with crisis, unspecified: Secondary | ICD-10-CM | POA: Diagnosis not present

## 2020-04-02 DIAGNOSIS — Z86711 Personal history of pulmonary embolism: Secondary | ICD-10-CM | POA: Diagnosis not present

## 2020-04-02 LAB — CBC
HCT: 24 % — ABNORMAL LOW (ref 36.0–46.0)
Hemoglobin: 7.9 g/dL — ABNORMAL LOW (ref 12.0–15.0)
MCH: 31 pg (ref 26.0–34.0)
MCHC: 32.9 g/dL (ref 30.0–36.0)
MCV: 94.1 fL (ref 80.0–100.0)
Platelets: 578 10*3/uL — ABNORMAL HIGH (ref 150–400)
RBC: 2.55 MIL/uL — ABNORMAL LOW (ref 3.87–5.11)
RDW: 18.2 % — ABNORMAL HIGH (ref 11.5–15.5)
WBC: 15.4 10*3/uL — ABNORMAL HIGH (ref 4.0–10.5)
nRBC: 0 % (ref 0.0–0.2)

## 2020-04-02 NOTE — Progress Notes (Signed)
Patient ID: Molly Gutierrez, female   DOB: 1980-10-02, 39 y.o.   MRN: [id number] Subjective: Molly Gutierrez is a 39 year old femalewith a medical history significant for sickle cell disease, chronic pain syndrome, opiate dependence and tolerance, and history of mild intermittent asthma was admitted for sickle cell pain crisis.  Patient has no new complaint today, said pain is slightly better than yesterday, now 7/10, still localized to the same area. She denies any other symptoms. She thinks she might need 1 more day in the hospital.  Objective:  Vital signs in last 24 hours:  Vitals:   04/02/20 0949 04/02/20 1200 04/02/20 1228 04/02/20 1301  BP:    113/74  Pulse:    95  Resp: 14  12 13   Temp:    99.4 F (37.4 C)  TempSrc:    Oral  SpO2: 98% 97% 96% 100%   Intake/Output from previous day:   Intake/Output Summary (Last 24 hours) at 04/02/2020 1331 Last data filed at 04/02/2020 9528 Gross per 24 hour  Intake 100 ml  Output 500 ml  Net -400 ml   Physical Exam: General: Alert, awake, oriented x3, in no acute distress.  HEENT: [place]/AT PEERL, EOMI Neck: Trachea midline,  no masses, no thyromegal,y no JVD, no carotid bruit OROPHARYNX:  Moist, No exudate/ erythema/lesions.  Heart: Regular rate and rhythm, without murmurs, rubs, gallops, PMI non-displaced, no heaves or thrills on palpation.  Lungs: Clear to auscultation, no wheezing or rhonchi noted. No increased vocal fremitus resonant to percussion  Abdomen: Soft, nontender, nondistended, positive bowel sounds, no masses no hepatosplenomegaly noted..  Neuro: No focal neurological deficits noted cranial nerves II through XII grossly intact. DTRs 2+ bilaterally upper and lower extremities. Strength 5 out of 5 in bilateral upper and lower extremities. Musculoskeletal: No warm swelling or erythema around joints, no spinal tenderness noted. Psychiatric: Patient alert and oriented x3, good insight and cognition, good recent to  remote recall. Lymph node survey: No cervical axillary or inguinal lymphadenopathy noted.  Lab Results:  Basic Metabolic Panel:    Component Value Date/Time   NA 138 04/01/2020 0440   K 3.6 04/01/2020 0440   CL 108 04/01/2020 0440   CO2 24 04/01/2020 0440   BUN 12 04/01/2020 0440   CREATININE 0.47 04/01/2020 0440   GLUCOSE 103 (H) 04/01/2020 0440   CALCIUM 8.5 (L) 04/01/2020 0440   CBC:    Component Value Date/Time   WBC 15.4 (H) 04/02/2020 0648   HGB 7.9 (L) 04/02/2020 0648   HCT 24.0 (L) 04/02/2020 0648   PLT 578 (H) 04/02/2020 0648   MCV 94.1 04/02/2020 0648   NEUTROABS 7.9 (H) 03/31/2020 1055   LYMPHSABS 1.8 03/31/2020 1055   MONOABS 1.4 (H) 03/31/2020 1055   EOSABS 0.3 03/31/2020 1055   BASOSABS 0.1 03/31/2020 1055    Recent Results (from the past 240 hour(s))  Respiratory Panel by RT PCR (Flu A&B, Covid) - Nasopharyngeal Swab     Status: None   Collection Time: 03/31/20  4:26 PM   Specimen: Nasopharyngeal Swab  Result Value Ref Range Status   SARS Coronavirus 2 by RT PCR NEGATIVE NEGATIVE Final    Comment: (NOTE) SARS-CoV-2 target nucleic acids are NOT DETECTED.  The SARS-CoV-2 RNA is generally detectable in upper respiratoy specimens during the acute phase of infection. The lowest concentration of SARS-CoV-2 viral copies this assay can detect is 131 copies/mL. A negative result does not preclude SARS-Cov-2 infection and should not be used as the sole basis for  treatment or other patient management decisions. A negative result may occur with  improper specimen collection/handling, submission of specimen other than nasopharyngeal swab, presence of viral mutation(s) within the areas targeted by this assay, and inadequate number of viral copies (<131 copies/mL). A negative result must be combined with clinical observations, patient history, and epidemiological information. The expected result is Negative.  Fact Sheet for Patients:   PinkCheek.be  Fact Sheet for Healthcare Providers:  GravelBags.it  This test is no t yet approved or cleared by the [place] FDA and  has been authorized for detection and/or diagnosis of SARS-CoV-2 by FDA under an Emergency Use Authorization (EUA). This EUA will remain  in effect (meaning this test can be used) for the duration of the COVID-19 declaration under Section 564(b)(1) of the Act, 21 U.S.C. section 360bbb-3(b)(1), unless the authorization is terminated or revoked sooner.     Influenza A by PCR NEGATIVE NEGATIVE Final   Influenza B by PCR NEGATIVE NEGATIVE Final    Comment: (NOTE) The Xpert Xpress SARS-CoV-2/FLU/RSV assay is intended as an aid in  the diagnosis of influenza from Nasopharyngeal swab specimens and  should not be used as a sole basis for treatment. Nasal washings and  aspirates are unacceptable for Xpert Xpress SARS-CoV-2/FLU/RSV  testing.  Fact Sheet for Patients: PinkCheek.be  Fact Sheet for Healthcare Providers: GravelBags.it  This test is not yet approved or cleared by the [place] FDA and  has been authorized for detection and/or diagnosis of SARS-CoV-2 by  FDA under an Emergency Use Authorization (EUA). This EUA will remain  in effect (meaning this test can be used) for the duration of the  Covid-19 declaration under Section 564(b)(1) of the Act, 21  U.S.C. section 360bbb-3(b)(1), unless the authorization is  terminated or revoked. Performed at [facility], [place] [address] [place], [place] [address]     Studies/Results: No results found.  Medications: Scheduled Meds: . Chlorhexidine Gluconate Cloth  6 each Topical Daily  . cholecalciferol  1,000 Units Oral Daily  . Deferasirox  1,080 mg Oral QAC breakfast  . folic acid  1 mg Oral Daily  . gabapentin  300 mg Oral TID  . HYDROmorphone    Intravenous Q4H  . ketorolac  15 mg Intravenous Q6H  . mirtazapine  45 mg Oral QHS  . mometasone-formoterol  2 puff Inhalation Daily  . pantoprazole  40 mg Oral Daily  . rivaroxaban  20 mg Oral Q supper  . senna-docusate  1 tablet Oral BID   Continuous Infusions:  PRN Meds:.albuterol, heparin lock flush, naloxone, polyethylene glycol, promethazine, sodium chloride flush, sodium chloride flush  Consultants:  None  Procedures:  None  Antibiotics:  None  Assessment/Plan: Principal Problem:   Sickle cell pain crisis ([place]) Active Problems:   Leukocytosis   History of pulmonary embolism   Opioid dependence (HCC)   Mild intermittent asthma  1. Hb Sickle Cell Disease with crisis: Reduce IVF to [facility], continue weight based Dilaudid PCA, continue IV Toradol 15 mg Q 6 H, Monitor vitals very closely, Re-evaluate pain scale regularly, 2 L of Oxygen by [place]. 2. Leukocytosis: Patient remains afebrile and no evidence suggesting infection or inflammation. No clinical indication for antibiotics for now. Will monitor closely we will repeat labs in a.m. 3. Sickle Cell Anemia: Hemoglobin remained stable at baseline, no clinical indication for blood transfusion today. Continue to monitor. 4. Chronic pain Syndrome: Continue to hold home Dilaudid, patient is on Dilaudid via PCA. 5. History of Pulmonary  Embolism: Continue Xarelto.  Code Status: Full Code Family Communication: N/A Disposition Plan: Not yet ready for discharge  Molly Gutierrez  If 7PM-7AM, please contact night-coverage.  04/02/2020, 1:31 PM  LOS: 2 days

## 2020-04-03 DIAGNOSIS — D5700 Hb-SS disease with crisis, unspecified: Secondary | ICD-10-CM | POA: Diagnosis not present

## 2020-04-03 MED ORDER — HYDROMORPHONE HCL 2 MG/ML IJ SOLN
2.0000 mg | Freq: Four times a day (QID) | INTRAMUSCULAR | Status: DC | PRN
  Administered 2020-04-03 – 2020-04-04 (×4): 2 mg via INTRAVENOUS
  Filled 2020-04-03 (×4): qty 1

## 2020-04-03 MED ORDER — HYDROMORPHONE HCL 4 MG PO TABS
4.0000 mg | ORAL_TABLET | ORAL | Status: DC | PRN
  Administered 2020-04-03 – 2020-04-04 (×6): 4 mg via ORAL
  Filled 2020-04-03 (×6): qty 1

## 2020-04-03 NOTE — Care Management Important Message (Signed)
Important Message  Patient Details IM Letter given to the Patient Name: Molly Gutierrez MRN: [id number] Date of Birth: [birth year]   Medicare Important Message Given:  Yes     Kerin Salen 04/03/2020, 10:00 AM

## 2020-04-03 NOTE — Progress Notes (Addendum)
Subjective: Molly Gutierrez is a 39 year old female with a medical history significant for sickle cell disease, chronic pain syndrome, opiate dependence and tolerance, and history of mild intermittent asthma was admitted for sickle cell pain crisis. Patient has no new complaints, she continues to complain of pain primarily to mid chest and lower back.  Pain intensity is 6/10 today, which is improved.  Patient feels that she cannot manage at home at current pain intensity.  Her goal is 3/10.  Objective:  Vital signs in last 24 hours:  Vitals:   04/03/20 0827 04/03/20 0845 04/03/20 0954 04/03/20 1032  BP:  99/73  115/80  Pulse:  73  77  Resp: 13 14  16   Temp:  98 F (36.7 C)  98.6 F (37 C)  TempSrc:  Oral  Oral  SpO2: 98% 100%  100%  Weight:   63.1 kg   Height:   5\' 3"  (1.6 m)     Intake/Output from previous day:  No intake or output data in the 24 hours ending 04/03/20 1143  Physical Exam: General: Alert, awake, oriented x3, in no acute distress.  HEENT: [place]/AT PEERL, EOMI Neck: Trachea midline,  no masses, no thyromegal,y no JVD, no carotid bruit OROPHARYNX:  Moist, No exudate/ erythema/lesions.  Heart: Regular rate and rhythm, without murmurs, rubs, gallops, PMI non-displaced, no heaves or thrills on palpation.  Lungs: Clear to auscultation, no wheezing or rhonchi noted. No increased vocal fremitus resonant to percussion  Abdomen: Soft, nontender, nondistended, positive bowel sounds, no masses no hepatosplenomegaly noted..  Neuro: No focal neurological deficits noted cranial nerves II through XII grossly intact. DTRs 2+ bilaterally upper and lower extremities. Strength 5 out of 5 in bilateral upper and lower extremities. Musculoskeletal: No warm swelling or erythema around joints, no spinal tenderness noted. Psychiatric: Patient alert and oriented x3, good insight and cognition, good recent to remote recall. Lymph node survey: No cervical axillary or inguinal  lymphadenopathy noted.  Lab Results:  Basic Metabolic Panel:    Component Value Date/Time   NA 138 04/01/2020 0440   K 3.6 04/01/2020 0440   CL 108 04/01/2020 0440   CO2 24 04/01/2020 0440   BUN 12 04/01/2020 0440   CREATININE 0.47 04/01/2020 0440   GLUCOSE 103 (H) 04/01/2020 0440   CALCIUM 8.5 (L) 04/01/2020 0440   CBC:    Component Value Date/Time   WBC 15.4 (H) 04/02/2020 0648   HGB 7.9 (L) 04/02/2020 0648   HCT 24.0 (L) 04/02/2020 0648   PLT 578 (H) 04/02/2020 0648   MCV 94.1 04/02/2020 0648   NEUTROABS 7.9 (H) 03/31/2020 1055   LYMPHSABS 1.8 03/31/2020 1055   MONOABS 1.4 (H) 03/31/2020 1055   EOSABS 0.3 03/31/2020 1055   BASOSABS 0.1 03/31/2020 1055    Recent Results (from the past 240 hour(s))  Respiratory Panel by RT PCR (Flu A&B, Covid) - Nasopharyngeal Swab     Status: None   Collection Time: 03/31/20  4:26 PM   Specimen: Nasopharyngeal Swab  Result Value Ref Range Status   SARS Coronavirus 2 by RT PCR NEGATIVE NEGATIVE Final    Comment: (NOTE) SARS-CoV-2 target nucleic acids are NOT DETECTED.  The SARS-CoV-2 RNA is generally detectable in upper respiratoy specimens during the acute phase of infection. The lowest concentration of SARS-CoV-2 viral copies this assay can detect is 131 copies/mL. A negative result does not preclude SARS-Cov-2 infection and should not be used as the sole basis for treatment or other patient management decisions. A negative result  may occur with  improper specimen collection/handling, submission of specimen other than nasopharyngeal swab, presence of viral mutation(s) within the areas targeted by this assay, and inadequate number of viral copies (<131 copies/mL). A negative result must be combined with clinical observations, patient history, and epidemiological information. The expected result is Negative.  Fact Sheet for Patients:  PinkCheek.be  Fact Sheet for Healthcare Providers:   GravelBags.it  This test is no t yet approved or cleared by the [place] FDA and  has been authorized for detection and/or diagnosis of SARS-CoV-2 by FDA under an Emergency Use Authorization (EUA). This EUA will remain  in effect (meaning this test can be used) for the duration of the COVID-19 declaration under Section 564(b)(1) of the Act, 21 U.S.C. section 360bbb-3(b)(1), unless the authorization is terminated or revoked sooner.     Influenza A by PCR NEGATIVE NEGATIVE Final   Influenza B by PCR NEGATIVE NEGATIVE Final    Comment: (NOTE) The Xpert Xpress SARS-CoV-2/FLU/RSV assay is intended as an aid in  the diagnosis of influenza from Nasopharyngeal swab specimens and  should not be used as a sole basis for treatment. Nasal washings and  aspirates are unacceptable for Xpert Xpress SARS-CoV-2/FLU/RSV  testing.  Fact Sheet for Patients: PinkCheek.be  Fact Sheet for Healthcare Providers: GravelBags.it  This test is not yet approved or cleared by the [place] FDA and  has been authorized for detection and/or diagnosis of SARS-CoV-2 by  FDA under an Emergency Use Authorization (EUA). This EUA will remain  in effect (meaning this test can be used) for the duration of the  Covid-19 declaration under Section 564(b)(1) of the Act, 21  U.S.C. section 360bbb-3(b)(1), unless the authorization is  terminated or revoked. Performed at [facility], [place] [address], [place], [place] [address]     Studies/Results: No results found.  Medications: Scheduled Meds: . Chlorhexidine Gluconate Cloth  6 each Topical Daily  . cholecalciferol  1,000 Units Oral Daily  . Deferasirox  1,080 mg Oral QAC breakfast  . folic acid  1 mg Oral Daily  . gabapentin  300 mg Oral TID  . ketorolac  15 mg Intravenous Q6H  . mirtazapine  45 mg Oral QHS  . mometasone-formoterol  2 puff  Inhalation Daily  . pantoprazole  40 mg Oral Daily  . rivaroxaban  20 mg Oral Q supper  . senna-docusate  1 tablet Oral BID   Continuous Infusions: PRN Meds:.albuterol, heparin lock flush, HYDROmorphone (DILAUDID) injection, HYDROmorphone, naloxone, polyethylene glycol, promethazine, sodium chloride flush, sodium chloride flush  Consultants:  None  Procedures:  None  Antibiotics:  None  Assessment/Plan: Principal Problem:   Sickle cell pain crisis ([place]) Active Problems:   Leukocytosis   History of pulmonary embolism   Opioid dependence (HCC)   Mild intermittent asthma   Sickle cell disease with pain crisis: Discontinue IV Dilaudid PCA. Initiate home medication regimen.  Dilaudid 4 mg by mouth every 4 hours as needed Dilaudid 2 mg every 6 hours as needed for severe breakthrough pain IV Toradol 15 mg every 6 hours Monitor vital signs closely, reevaluate pain scale regularly, and supplemental oxygen as needed.  Discharge planned for 04/04/2020.  Leukocytosis: Stable.  Patient remains afebrile without any signs of infection or inflammation.  Continue to monitor without antibiotics.  Sickle cell anemia: Hemoglobin is stable and consistent with patient's baseline.  There is no clinical indication for blood transfusion at this time.  Monitor closely.  Chronic pain syndrome: Restart home Dilaudid  History of pulmonary embolism: Continue Xarelto Code Status: Full Code Family Communication: N/A Disposition Plan: Not yet ready for discharge  [place], MSN, FNP-C [facility] [address] [place], [place] [address] [phone]  If 5PM-8AM, please contact night-coverage.  04/03/2020, 11:43 AM  LOS: 3 days

## 2020-04-04 ENCOUNTER — Institutional Professional Consult (permissible substitution): Payer: Medicare Other | Admitting: Internal Medicine

## 2020-04-04 DIAGNOSIS — D5700 Hb-SS disease with crisis, unspecified: Secondary | ICD-10-CM | POA: Diagnosis not present

## 2020-04-04 LAB — CBC
HCT: 22.7 % — ABNORMAL LOW (ref 36.0–46.0)
Hemoglobin: 7.4 g/dL — ABNORMAL LOW (ref 12.0–15.0)
MCH: 30.6 pg (ref 26.0–34.0)
MCHC: 32.6 g/dL (ref 30.0–36.0)
MCV: 93.8 fL (ref 80.0–100.0)
Platelets: 602 10*3/uL — ABNORMAL HIGH (ref 150–400)
RBC: 2.42 MIL/uL — ABNORMAL LOW (ref 3.87–5.11)
RDW: 18.4 % — ABNORMAL HIGH (ref 11.5–15.5)
WBC: 13.8 10*3/uL — ABNORMAL HIGH (ref 4.0–10.5)
nRBC: 0 % (ref 0.0–0.2)

## 2020-04-04 MED ORDER — HYDROMORPHONE HCL 4 MG PO TABS
4.0000 mg | ORAL_TABLET | Freq: Four times a day (QID) | ORAL | 0 refills | Status: DC | PRN
Start: 2020-04-04 — End: 2020-04-13

## 2020-04-04 NOTE — Progress Notes (Signed)
Patient made aware of discharge orders. Discharge instructions were given with no immediate questions or concerns. Patient is waiting on her ride.

## 2020-04-04 NOTE — Discharge Summary (Signed)
Physician Discharge Summary  [facility] PYP:[id number] DOB: [birth year] DOA: 03/31/2020  PCP: Vevelyn Francois, NP  Admit date: 03/31/2020  Discharge date: 04/04/2020  Discharge Diagnoses:  Principal Problem:   Sickle cell pain crisis ([place]) Active Problems:   Leukocytosis   History of pulmonary embolism   Opioid dependence ([place])   Mild intermittent asthma   Discharge Condition: Stable  Disposition:   Follow-up Information    Vevelyn Francois, NP Follow up in 1 week(s).   Specialty: Adult Health Nurse Practitioner Contact information: [address] [place] [address] [phone]              Pt is discharged home in good condition and is to follow up with Vevelyn Francois, NP this week to have labs evaluated. Gwendolen [place] is instructed to increase activity slowly and balance with rest for the next few days, and use prescribed medication to complete treatment of pain  Diet: Regular Wt Readings from Last 3 Encounters:  04/03/20 63.1 kg  03/01/20 62.9 kg  02/16/20 55.5 kg    History of present illness:  Jenipher Havel  is a 39 y.o. female with a medical history significant for sickle cell disease, chronic pain syndrome, opiate dependence and tolerance, history of pulmonary embolism on Xarelto, and history of mild intermittent asthma presented to sickle cell day infusion clinic with complaints of mid chest and low back pain that is consistent with her typical pain crisis. Patient attributes pain crisis to changes in weather. She was treated and evaluated at [facility] emergency department on yesterday for this problem. Patient says that pain was somewhat relieved at discharge but was not sustained. Home opiate medication includes Dilaudid, and patient says that she has been taking consistently. Current pain intensity is 9/10 characterized as constant and sharp. Patient denies any fever, chills, headache, urinary symptoms. No nausea,  vomiting, or diarrhea. No dizziness, persistent cough, or shortness of breath. No sick contacts, recent travel, or exposure to COVID-19.  Sickle cell day clinic course: Vital signs show: BP 110/78 (BP Location: Right Arm)   Pulse 82   Temp 98.4 F (36.9 C) (Temporal)   Resp 11   LMP 03/11/2020   SpO2 98% . Hemoglobin 9.0, appears to be slightly above baseline. WBCs 11.5, chronically elevated. COVID-19 test pending. All other laboratory values are largely within normal limits. Patient's pain persists despite IV Dilaudid PCA, IV fluids, and IV Toradol. Admit to [place] for further management of sickle cell pain crisis  Hospital Course:  Sickle cell disease with pain crisis: Patient was admitted for sickle cell pain crisis and managed appropriately with IVF, IV Dilaudid via PCA and IV Toradol, as well as other adjunct therapies per sickle cell pain management protocols.  Patient was transitioned to home medication regimen.  Tolerated well.  Advised to resume all medications.  Also, advised to continue to hydrate consistently.  Pain intensity decreased to 4/10.  Chronic pain syndrome: Patient will resume home medications.  Currently, pain medication is managed by hematology.  Patient is out of home medications.  Will send Dilaudid 4 mg #16 to patient's pharmacy.  Reviewed PDMP prior to prescribing opiate medications, no inconsistencies noted.  Patient states that going forward, her medication will be managed by her PCP.  Advised to schedule first available appointment for medication management.  Sickle cell anemia: Patient's hemoglobin remained consistent with her baseline throughout admission.  There was no clinical indication for transfusion.  Patient will continue folic acid daily.  She is not on any disease modifying agents at this time.  Advised to follow-up with [facility] hematology as scheduled. Patient was therefore discharged home today in a hemodynamically stable condition.   Mild  intermittent asthma: Patient has very well-controlled asthma.  She has not had an exacerbation in quite some time.  Remained stable throughout admission   Ayza will follow-up with PCP within 1 week of this discharge. Bernedette was counseled extensively about nonpharmacologic means of pain management, patient verbalized understanding and was appreciative of  the care received during this admission.   We discussed the need for good hydration, monitoring of hydration status, avoidance of heat, cold, stress, and infection triggers. We discussed the need to be adherent with taking Hydrea and other home medications. Patient was reminded of the need to seek medical attention immediately if any symptom of bleeding, anemia, or infection occurs.  Discharge Exam: Vitals:   04/04/20 0733 04/04/20 0945  BP:  95/66  Pulse:  75  Resp:  14  Temp:  98.5 F (36.9 C)  SpO2: 96% 98%   Vitals:   04/04/20 0015 04/04/20 0402 04/04/20 0733 04/04/20 0945  BP: 101/64 102/70  95/66  Pulse: 80 84  75  Resp: 14 14  14   Temp: 98.4 F (36.9 C) 98.5 F (36.9 C)  98.5 F (36.9 C)  TempSrc: Oral Oral  Oral  SpO2: 97% 99% 96% 98%  Weight:      Height:        General appearance : Awake, alert, not in any distress. Speech Clear. Not toxic looking HEENT: Atraumatic and Normocephalic, pupils equally reactive to light and accomodation Neck: Supple, no JVD. No cervical lymphadenopathy.  Chest: Good air entry bilaterally, no added sounds  CVS: S1 S2 regular, no murmurs.  Abdomen: Bowel sounds present, Non tender and not distended with no gaurding, rigidity or rebound. Extremities: B/L Lower Ext shows no edema, both legs are warm to touch Neurology: Awake alert, and oriented X 3, CN II-XII intact, Non focal Skin: No Rash  Discharge Instructions  Discharge Instructions    Discharge patient   Complete by: As directed    Discharge disposition: 01-Home or Self Care   Discharge patient date: 04/04/2020      Allergies as of 04/04/2020      Reactions   Cefaclor Hives, Swelling   Hydroxyurea Palpitations, Other (See Comments)   Lowers "blood levels" and heart rate (causes HYPOtension); "it messes me up, it drops my levels and stuff" Other reaction(s): Hypotension, Other (See Comments) "it messes me up, it drops my levels and stuff" Lower blood levels and HR Other reaction(s): Other (See Comments) Lowers "blood levels" and heart rate (causes HYPOtension); "it messes me up, it drops my levels and stuff"   Ketamine Palpitations, Other (See Comments)   "Pt states she has had previous reaction to ketamine. States she becomes flushed, heart races, dizzy, and feels like she is going to pass out." Other reaction(s): Other (See Comments), Other (See Comments) "Pt states she has had previous reaction to ketamine. States she becomes flushed, heart races, dizzy, and feels like she is going to pass out." "Pt states she has had previous reaction to ketamine. States she becomes flushed, heart races, dizzy, and feels like she is going to pass out."      Medication List    TAKE these medications   albuterol 108 (90 Base) MCG/ACT inhaler Commonly known as: VENTOLIN HFA Inhale 2 puffs into the lungs 2 (two) times daily as  needed for wheezing or shortness of breath.   celecoxib 200 MG capsule Commonly known as: CELEBREX Take 200 mg by mouth daily.   diphenhydrAMINE 25 mg capsule Commonly known as: BENADRYL Take 25 mg by mouth 3 (three) times daily as needed for itching.   folic acid 1 MG tablet Commonly known as: FOLVITE Take 1 mg by mouth daily.   gabapentin 300 MG capsule Commonly known as: NEURONTIN Take 300 mg by mouth 3 (three) times daily.   HYDROmorphone 4 MG tablet Commonly known as: DILAUDID Take 4-8 mg by mouth every 6 (six) hours as needed for moderate pain or severe pain.   Jadenu 360 MG Tabs Generic drug: Deferasirox Take 1,080 mg by mouth daily before breakfast.    mirtazapine 15 MG tablet Commonly known as: REMERON Take 45 mg by mouth at bedtime.   mometasone-formoterol 100-5 MCG/ACT Aero Commonly known as: DULERA Inhale 2 puffs into the lungs daily as needed for wheezing or shortness of breath.   omeprazole 20 MG capsule Commonly known as: PRILOSEC Take 20 mg by mouth 2 (two) times a day.   oxyCODONE-acetaminophen 5-325 MG tablet Commonly known as: PERCOCET/ROXICET Take 1 tablet by mouth every 4 (four) hours as needed for moderate pain or severe pain.   promethazine 25 MG tablet Commonly known as: PHENERGAN Take 12.5-25 mg by mouth every 6 (six) hours as needed for nausea or vomiting.   vitamin B-12 1000 MCG tablet Commonly known as: CYANOCOBALAMIN Take 1,000 mcg by mouth daily.   Vitamin D3 25 MCG (1000 UT) Caps Take 1,000 Units by mouth daily.   Xarelto 20 MG Tabs tablet Generic drug: rivaroxaban Take 1 tablet (20 mg total) by mouth daily. What changed: when to take this       The results of significant diagnostics from this hospitalization (including imaging, microbiology, ancillary and laboratory) are listed below for reference.    Significant Diagnostic Studies: No results found.  Microbiology: Recent Results (from the past 240 hour(s))  Respiratory Panel by RT PCR (Flu A&B, Covid) - Nasopharyngeal Swab     Status: None   Collection Time: 03/31/20  4:26 PM   Specimen: Nasopharyngeal Swab  Result Value Ref Range Status   SARS Coronavirus 2 by RT PCR NEGATIVE NEGATIVE Final    Comment: (NOTE) SARS-CoV-2 target nucleic acids are NOT DETECTED.  The SARS-CoV-2 RNA is generally detectable in upper respiratoy specimens during the acute phase of infection. The lowest concentration of SARS-CoV-2 viral copies this assay can detect is 131 copies/mL. A negative result does not preclude SARS-Cov-2 infection and should not be used as the sole basis for treatment or other patient management decisions. A negative result may occur  with  improper specimen collection/handling, submission of specimen other than nasopharyngeal swab, presence of viral mutation(s) within the areas targeted by this assay, and inadequate number of viral copies (<131 copies/mL). A negative result must be combined with clinical observations, patient history, and epidemiological information. The expected result is Negative.  Fact Sheet for Patients:  PinkCheek.be  Fact Sheet for Healthcare Providers:  GravelBags.it  This test is no t yet approved or cleared by the [place] FDA and  has been authorized for detection and/or diagnosis of SARS-CoV-2 by FDA under an Emergency Use Authorization (EUA). This EUA will remain  in effect (meaning this test can be used) for the duration of the COVID-19 declaration under Section 564(b)(1) of the Act, 21 U.S.C. section 360bbb-3(b)(1), unless the authorization is terminated or revoked sooner.  Influenza A by PCR NEGATIVE NEGATIVE Final   Influenza B by PCR NEGATIVE NEGATIVE Final    Comment: (NOTE) The Xpert Xpress SARS-CoV-2/FLU/RSV assay is intended as an aid in  the diagnosis of influenza from Nasopharyngeal swab specimens and  should not be used as a sole basis for treatment. Nasal washings and  aspirates are unacceptable for Xpert Xpress SARS-CoV-2/FLU/RSV  testing.  Fact Sheet for Patients: PinkCheek.be  Fact Sheet for Healthcare Providers: GravelBags.it  This test is not yet approved or cleared by the [place] FDA and  has been authorized for detection and/or diagnosis of SARS-CoV-2 by  FDA under an Emergency Use Authorization (EUA). This EUA will remain  in effect (meaning this test can be used) for the duration of the  Covid-19 declaration under Section 564(b)(1) of the Act, 21  U.S.C. section 360bbb-3(b)(1), unless the authorization is  terminated or  revoked. Performed at [facility], [place] [address], [place], [place] [address]      Labs: Basic Metabolic Panel: Recent Labs  Lab 03/31/20 1055 04/01/20 0440  NA 136 138  K 3.8 3.6  CL 107 108  CO2 21* 24  GLUCOSE 94 103*  BUN 11 12  CREATININE 0.53 0.47  CALCIUM 8.6* 8.5*   Liver Function Tests: Recent Labs  Lab 03/31/20 1055  AST 40  ALT 23  ALKPHOS 52  BILITOT 2.7*  PROT 7.4  ALBUMIN 4.3   No results for input(s): LIPASE, AMYLASE in the last 168 hours. No results for input(s): AMMONIA in the last 168 hours. CBC: Recent Labs  Lab 03/31/20 1055 04/01/20 0440 04/02/20 0648 04/04/20 0802  WBC 11.5* 11.0* 15.4* 13.8*  NEUTROABS 7.9*  --   --   --   HGB 9.0* 8.2* 7.9* 7.4*  HCT 28.3* 25.4* 24.0* 22.7*  MCV 93.1 93.0 94.1 93.8  PLT 581* 528* 578* 602*   Cardiac Enzymes: No results for input(s): CKTOTAL, CKMB, CKMBINDEX, TROPONINI in the last 168 hours. BNP: Invalid input(s): POCBNP CBG: No results for input(s): GLUCAP in the last 168 hours.  Time coordinating discharge: 35 minutes  Signed:  [name]  APRN, MSN, FNP-C [facility] [address] [place], [place] [address] [phone]  [facility] 04/04/2020, 12:42 PM

## 2020-04-13 ENCOUNTER — Encounter: Payer: Self-pay | Admitting: Nurse Practitioner

## 2020-04-13 ENCOUNTER — Ambulatory Visit (INDEPENDENT_AMBULATORY_CARE_PROVIDER_SITE_OTHER): Payer: Medicare Other | Admitting: Nurse Practitioner

## 2020-04-13 ENCOUNTER — Ambulatory Visit (HOSPITAL_COMMUNITY)
Admission: RE | Admit: 2020-04-13 | Discharge: 2020-04-13 | Disposition: A | Discharge status: Home / Self Care | Payer: Medicare Other | Source: Ambulatory Visit | Attending: Internal Medicine | Admitting: Internal Medicine

## 2020-04-13 ENCOUNTER — Other Ambulatory Visit: Payer: Self-pay

## 2020-04-13 VITALS — BP 112/75 | HR 97 | Temp 98.7°F | Ht 63.0 in | Wt 122.6 lb

## 2020-04-13 DIAGNOSIS — G894 Chronic pain syndrome: Secondary | ICD-10-CM | POA: Diagnosis not present

## 2020-04-13 DIAGNOSIS — D571 Sickle-cell disease without crisis: Secondary | ICD-10-CM | POA: Insufficient documentation

## 2020-04-13 DIAGNOSIS — Z452 Encounter for adjustment and management of vascular access device: Secondary | ICD-10-CM | POA: Insufficient documentation

## 2020-04-13 LAB — POCT URINALYSIS DIPSTICK
Bilirubin, UA: NEGATIVE
Blood, UA: NEGATIVE
Glucose, UA: NEGATIVE
Ketones, UA: NEGATIVE
Leukocytes, UA: NEGATIVE
Nitrite, UA: NEGATIVE
Protein, UA: NEGATIVE
Spec Grav, UA: 1.015 (ref 1.010–1.025)
Urobilinogen, UA: 1 E.U./dL
pH, UA: 6 (ref 5.0–8.0)

## 2020-04-13 MED ORDER — SODIUM CHLORIDE 0.9% FLUSH
10.0000 mL | INTRAVENOUS | Status: AC | PRN
  Administered 2020-04-13: 10 mL

## 2020-04-13 MED ORDER — DEFERASIROX 360 MG PO TABS: 1080.0000 mg | ORAL_TABLET | Freq: Every day | ORAL | 11 refills | Status: AC

## 2020-04-13 MED ORDER — GABAPENTIN 300 MG PO CAPS: 300.0000 mg | ORAL_CAPSULE | Freq: Three times a day (TID) | ORAL | 3 refills | Status: DC

## 2020-04-13 MED ORDER — MIRTAZAPINE 45 MG PO TABS: 45.0000 mg | ORAL_TABLET | Freq: Every day | ORAL | 3 refills | Status: DC

## 2020-04-13 MED ORDER — ALBUTEROL SULFATE HFA 108 (90 BASE) MCG/ACT IN AERS: 2.0000 | INHALATION_SPRAY | Freq: Two times a day (BID) | RESPIRATORY_TRACT | 11 refills | Status: DC | PRN

## 2020-04-13 MED ORDER — HEPARIN SOD (PORK) LOCK FLUSH 100 UNIT/ML IV SOLN
500.0000 [IU] | INTRAVENOUS | Status: AC | PRN
  Administered 2020-04-13: 500 [IU]
  Filled 2020-04-13: qty 5

## 2020-04-13 MED ORDER — CELECOXIB 200 MG PO CAPS: 200.0000 mg | ORAL_CAPSULE | Freq: Every day | ORAL | 3 refills | Status: DC

## 2020-04-13 MED ORDER — MOMETASONE FURO-FORMOTEROL FUM 100-5 MCG/ACT IN AERO: 2.0000 | INHALATION_SPRAY | Freq: Every day | RESPIRATORY_TRACT | 5 refills | Status: DC | PRN

## 2020-04-13 MED ORDER — PROMETHAZINE HCL 25 MG PO TABS
12.5000 mg | ORAL_TABLET | Freq: Four times a day (QID) | ORAL | 11 refills | Status: DC | PRN
Start: 2020-04-13 — End: 2020-08-02

## 2020-04-13 MED ORDER — HEPARIN SOD (PORK) LOCK FLUSH 10 UNIT/ML IV SOLN: 10.0000 [IU] | Freq: Once | INTRAVENOUS | Status: DC

## 2020-04-13 MED ORDER — HYDROMORPHONE HCL 4 MG PO TABS
4.0000 mg | ORAL_TABLET | Freq: Four times a day (QID) | ORAL | 0 refills | Status: DC | PRN
Start: 2020-04-13 — End: 2020-05-08

## 2020-04-13 MED ORDER — SODIUM CHLORIDE (PF) 0.9 % IJ SOLN: 10.0000 mL | Freq: Once | INTRAMUSCULAR | Status: DC

## 2020-04-13 MED ORDER — OMEPRAZOLE 20 MG PO CPDR: 20.0000 mg | DELAYED_RELEASE_CAPSULE | Freq: Every day | ORAL | 3 refills | Status: DC

## 2020-04-13 MED ORDER — FOLIC ACID 1 MG PO TABS: 1.0000 mg | ORAL_TABLET | Freq: Every day | ORAL | 3 refills | Status: AC

## 2020-04-13 MED ORDER — XARELTO 20 MG PO TABS: 20.0000 mg | ORAL_TABLET | Freq: Every day | ORAL | 3 refills | Status: DC

## 2020-04-13 MED ORDER — VITAMIN B-12 1000 MCG PO TABS: 1000.0000 ug | ORAL_TABLET | Freq: Every day | ORAL | 3 refills | Status: AC

## 2020-04-13 MED ORDER — VITAMIN D3 25 MCG (1000 UT) PO CAPS: 1000.0000 [IU] | ORAL_CAPSULE | Freq: Every day | ORAL | 3 refills | Status: AC

## 2020-04-13 NOTE — Patient Instructions (Signed)
Health Maintenance, Female Adopting a healthy lifestyle and getting preventive care are important in promoting health and wellness. Ask your health care provider about:  The right schedule for you to have regular tests and exams.  Things you can do on your own to prevent diseases and keep yourself healthy. What should I know about diet, weight, and exercise? Eat a healthy diet   Eat a diet that includes plenty of vegetables, fruits, low-fat dairy products, and lean protein.  Do not eat a lot of foods that are high in solid fats, added sugars, or sodium. Maintain a healthy weight Body mass index (BMI) is used to identify weight problems. It estimates body fat based on height and weight. Your health care provider can help determine your BMI and help you achieve or maintain a healthy weight. Get regular exercise Get regular exercise. This is one of the most important things you can do for your health. Most adults should:  Exercise for at least 150 minutes each week. The exercise should increase your heart rate and make you sweat (moderate-intensity exercise).  Do strengthening exercises at least twice a week. This is in addition to the moderate-intensity exercise.  Spend less time sitting. Even light physical activity can be beneficial. Watch cholesterol and blood lipids Have your blood tested for lipids and cholesterol at 39 years of age, then have this test every 5 years. Have your cholesterol levels checked more often if:  Your lipid or cholesterol levels are high.  You are older than 40 years of age.  You are at high risk for heart disease. What should I know about cancer screening? Depending on your health history and family history, you may need to have cancer screening at various ages. This may include screening for:  Breast cancer.  Cervical cancer.  Colorectal cancer.  Skin cancer.  Lung cancer. What should I know about heart disease, diabetes, and high blood  pressure? Blood pressure and heart disease  High blood pressure causes heart disease and increases the risk of stroke. This is more likely to develop in people who have high blood pressure readings, are of African descent, or are overweight.  Have your blood pressure checked: ? Every 3-5 years if you are 18-39 years of age. ? Every year if you are 40 years old or older. Diabetes Have regular diabetes screenings. This checks your fasting blood sugar level. Have the screening done:  Once every three years after age 40 if you are at a normal weight and have a low risk for diabetes.  More often and at a younger age if you are overweight or have a high risk for diabetes. What should I know about preventing infection? Hepatitis B If you have a higher risk for hepatitis B, you should be screened for this virus. Talk with your health care provider to find out if you are at risk for hepatitis B infection. Hepatitis C Testing is recommended for:  Everyone born from 1945 through 1965.  Anyone with known risk factors for hepatitis C. Sexually transmitted infections (STIs)  Get screened for STIs, including gonorrhea and chlamydia, if: ? You are sexually active and are younger than 39 years of age. ? You are older than 39 years of age and your health care provider tells you that you are at risk for this type of infection. ? Your sexual activity has changed since you were last screened, and you are at increased risk for chlamydia or gonorrhea. Ask your health care provider if   you are at risk.  Ask your health care provider about whether you are at high risk for HIV. Your health care provider may recommend a prescription medicine to help prevent HIV infection. If you choose to take medicine to prevent HIV, you should first get tested for HIV. You should then be tested every 3 months for as long as you are taking the medicine. Pregnancy  If you are about to stop having your period (premenopausal) and  you may become pregnant, seek counseling before you get pregnant.  Take 400 to 800 micrograms (mcg) of folic acid every day if you become pregnant.  Ask for birth control (contraception) if you want to prevent pregnancy. Osteoporosis and menopause Osteoporosis is a disease in which the bones lose minerals and strength with aging. This can result in bone fractures. If you are 65 years old or older, or if you are at risk for osteoporosis and fractures, ask your health care provider if you should:  Be screened for bone loss.  Take a calcium or vitamin D supplement to lower your risk of fractures.  Be given hormone replacement therapy (HRT) to treat symptoms of menopause. Follow these instructions at home: Lifestyle  Do not use any products that contain nicotine or tobacco, such as cigarettes, e-cigarettes, and chewing tobacco. If you need help quitting, ask your health care provider.  Do not use street drugs.  Do not share needles.  Ask your health care provider for help if you need support or information about quitting drugs. Alcohol use  Do not drink alcohol if: ? Your health care provider tells you not to drink. ? You are pregnant, may be pregnant, or are planning to become pregnant.  If you drink alcohol: ? Limit how much you use to 0-1 drink a day. ? Limit intake if you are breastfeeding.  Be aware of how much alcohol is in your drink. In the [place] one drink equals one 12 oz bottle of beer (355 mL), one 5 oz glass of wine (148 mL), or one 1 oz glass of hard liquor (44 mL). General instructions  Schedule regular health, dental, and eye exams.  Stay current with your vaccines.  Tell your health care provider if: ? You often feel depressed. ? You have ever been abused or do not feel safe at home. Summary  Adopting a healthy lifestyle and getting preventive care are important in promoting health and wellness.  Follow your health care provider's instructions about healthy  diet, exercising, and getting tested or screened for diseases.  Follow your health care provider's instructions on monitoring your cholesterol and blood pressure. This information is not intended to replace advice given to you by your health care provider. Make sure you discuss any questions you have with your health care provider. Document Revised: 05/27/2018 Document Reviewed: 05/27/2018 Elsevier Patient Education  2020 [employer]  

## 2020-04-13 NOTE — Progress Notes (Signed)
[place], [place]  [address] Phone:  [phone]   Fax:  [phone]   Established Patient Office Visit  Subjective:  Patient ID: Molly Gutierrez, female    DOB: 06-Mar-1981  Age: 39 y.o. MRN: [id number]  CC:  Chief Complaint  Patient presents with  . Follow-up    HPI [facility] presents for follow up. She  has a past medical history of Asthma, Eczema, History of pulmonary embolus (PE), and Sickle cell anemia ([place]).   She is in today for follow-up to have her care transferred to our office.  She denies any pain today.  She does try to treat her known severe pain with nonpharmacological therapies.  She only uses the hydromorphone for severe pain. Denies fever, headache, cough, wheezing, shortness of breath, chest pains, abdominal pain, back pain, hip pain, or leg pain. Denies any open wounds, skin irritation.    Past Medical History:  Diagnosis Date  . Asthma   . Eczema   . History of pulmonary embolus (PE)   . Sickle cell anemia (HCC)     Past Surgical History:  Procedure Laterality Date  . CHOLECYSTECTOMY    . ERCP    . JOINT REPLACEMENT    . PORTA CATH INSERTION    . TUBAL LIGATION    . WISDOM TOOTH EXTRACTION      Family History  Problem Relation Age of Onset  . Renal Disease Mother   . Hypertension Mother   . High Cholesterol Mother     Social History   Socioeconomic History  . Marital status: Single    Spouse name: Not on file  . Number of children: Not on file  . Years of education: Not on file  . Highest education level: Not on file  Occupational History  . Not on file  Tobacco Use  . Smoking status: Never Smoker  . Smokeless tobacco: Never Used  Vaping Use  . Vaping Use: Never used  Substance and Sexual Activity  . Alcohol use: Never  . Drug use: Never  . Sexual activity: Not on file  Other Topics Concern  . Not on file  Social History Narrative  . Not on file   Social  Determinants of Health   Financial Resource Strain:   . Difficulty of Paying Living Expenses: Not on file  Food Insecurity:   . Worried About Charity fundraiser in the Last Year: Not on file  . Ran Out of Food in the Last Year: Not on file  Transportation Needs:   . Lack of Transportation (Medical): Not on file  . Lack of Transportation (Non-Medical): Not on file  Physical Activity:   . Days of Exercise per Week: Not on file  . Minutes of Exercise per Session: Not on file  Stress:   . Feeling of Stress : Not on file  Social Connections:   . Frequency of Communication with Friends and Family: Not on file  . Frequency of Social Gatherings with Friends and Family: Not on file  . Attends Religious Services: Not on file  . Active Member of Clubs or Organizations: Not on file  . Attends Archivist Meetings: Not on file  . Marital Status: Not on file  Intimate Partner Violence:   . Fear of Current or Ex-Partner: Not on file  . Emotionally Abused: Not on file  . Physically Abused: Not on file  . Sexually Abused: Not on file  Outpatient Medications Prior to Visit  Medication Sig Dispense Refill  . diphenhydrAMINE (BENADRYL) 25 mg capsule Take 25 mg by mouth 3 (three) times daily as needed for itching.    . mirtazapine (REMERON) 15 MG tablet Take 45 mg by mouth at bedtime.     Marland Kitchen albuterol (VENTOLIN HFA) 108 (90 Base) MCG/ACT inhaler Inhale 2 puffs into the lungs 2 (two) times daily as needed for wheezing or shortness of breath.    . celecoxib (CELEBREX) 200 MG capsule Take 200 mg by mouth daily.     . Cholecalciferol (VITAMIN D3) 25 MCG (1000 UT) CAPS Take 1,000 Units by mouth daily.    . Deferasirox (JADENU) 360 MG TABS Take 1,080 mg by mouth daily before breakfast.    . folic acid (FOLVITE) 1 MG tablet Take 1 mg by mouth daily.    Marland Kitchen gabapentin (NEURONTIN) 300 MG capsule Take 300 mg by mouth 3 (three) times daily.    Marland Kitchen HYDROmorphone (DILAUDID) 4 MG tablet Take 1 tablet (4  mg total) by mouth every 6 (six) hours as needed for moderate pain or severe pain. 16 tablet 0  . mometasone-formoterol (DULERA) 100-5 MCG/ACT AERO Inhale 2 puffs into the lungs daily as needed for wheezing or shortness of breath.    Marland Kitchen omeprazole (PRILOSEC) 20 MG capsule Take 20 mg by mouth 2 (two) times a day.    . promethazine (PHENERGAN) 25 MG tablet Take 12.5-25 mg by mouth every 6 (six) hours as needed for nausea or vomiting.    . vitamin B-12 (CYANOCOBALAMIN) 1000 MCG tablet Take 1,000 mcg by mouth daily.    Alveda Reasons 20 MG TABS tablet Take 1 tablet (20 mg total) by mouth daily. (Patient taking differently: Take 20 mg by mouth daily with supper. ) 30 tablet 1  . oxyCODONE-acetaminophen (PERCOCET/ROXICET) 5-325 MG tablet Take 1 tablet by mouth every 4 (four) hours as needed for moderate pain or severe pain. (Patient not taking: Reported on 04/13/2020)     No facility-administered medications prior to visit.    Allergies  Allergen Reactions  . Cefaclor Hives and Swelling  . Hydroxyurea Palpitations and Other (See Comments)    Lowers "blood levels" and heart rate (causes HYPOtension); "it messes me up, it drops my levels and stuff"  Other reaction(s): Hypotension, Other (See Comments) "it messes me up, it drops my levels and stuff" Lower blood levels and HR  Other reaction(s): Other (See Comments) Lowers "blood levels" and heart rate (causes HYPOtension); "it messes me up, it drops my levels and stuff"  . Ketamine Palpitations and Other (See Comments)    "Pt states she has had previous reaction to ketamine. States she becomes flushed, heart races, dizzy, and feels like she is going to pass out." Other reaction(s): Other (See Comments), Other (See Comments) "Pt states she has had previous reaction to ketamine. States she becomes flushed, heart races, dizzy, and feels like she is going to pass out." "Pt states she has had previous reaction to ketamine. States she becomes flushed, heart  races, dizzy, and feels like she is going to pass out."    ROS Review of Systems    Objective:    Physical Exam Constitutional:      Appearance: Normal appearance.     Comments: She looks happy and well  HENT:     Head: Normocephalic and atraumatic.     Nose: Nose normal.     Mouth/Throat:     Mouth: Mucous membranes are moist.  Cardiovascular:  Rate and Rhythm: Normal rate and regular rhythm.     Pulses: Normal pulses.     Heart sounds: Normal heart sounds.  Pulmonary:     Effort: Pulmonary effort is normal.     Breath sounds: Normal breath sounds.  Abdominal:     Palpations: Abdomen is soft.  Musculoskeletal:        General: Normal range of motion.     Cervical back: Normal range of motion.  Skin:    General: Skin is warm.     Capillary Refill: Capillary refill takes less than 2 seconds.  Neurological:     General: No focal deficit present.     Mental Status: She is alert and oriented to person, place, and time.  Psychiatric:        Mood and Affect: Mood normal.        Behavior: Behavior normal.        Thought Content: Thought content normal.        Judgment: Judgment normal.     BP 112/75 (BP Location: Left Arm, Patient Position: Sitting, Cuff Size: Normal)   Pulse 97   Temp 98.7 F (37.1 C)   Ht 5\' 3"  (1.6 m)   Wt 122 lb 9.6 oz (55.6 kg)   LMP 04/01/2020 (Exact Date)   SpO2 100%   BMI 21.72 kg/m  Wt Readings from Last 3 Encounters:  04/13/20 122 lb 9.6 oz (55.6 kg)  04/03/20 139 lb 1.8 oz (63.1 kg)  03/01/20 138 lb 10.7 oz (62.9 kg)     There are no preventive care reminders to display for this patient.  There are no preventive care reminders to display for this patient.  No results found for: TSH Lab Results  Component Value Date   WBC 16.4 (H) 04/13/2020   HGB 8.6 (L) 04/13/2020   HCT 25.5 (L) 04/13/2020   MCV 88 04/13/2020   PLT 648 (H) 04/13/2020   Lab Results  Component Value Date   NA 137 04/13/2020   K 3.6 04/13/2020   CO2 20  04/13/2020   GLUCOSE 96 04/13/2020   BUN 8 04/13/2020   CREATININE 0.55 (L) 04/13/2020   BILITOT 2.9 (H) 04/13/2020   ALKPHOS 83 04/13/2020   AST 33 04/13/2020   ALT 27 04/13/2020   PROT 8.1 04/13/2020   ALBUMIN 4.9 (H) 04/13/2020   CALCIUM 9.3 04/13/2020   ANIONGAP 6 04/01/2020   No results found for: CHOL No results found for: HDL No results found for: LDLCALC No results found for: TRIG No results found for: CHOLHDL No results found for: HGBA1C    Assessment & Plan:   Problem List Items Addressed This Visit    None    Visit Diagnoses    Hb-SS disease without crisis ([place])    -  Primary Ensure adequate hydration. Move frequently to reduce venous thromboembolism risk. Avoid situations that could lead to dehydration or could exacerbate pain Discussed S&S of infection, seizures, stroke acute chest, DVT and how important it is to seek medical attention Take medication as directed along with pain contract and overall compliance Discussed the risk related to opiate use (addition, tolerance and dependency)     Relevant Medications   folic acid (FOLVITE) 1 MG tablet   vitamin B-12 (CYANOCOBALAMIN) 1000 MCG tablet   sodium chloride (PF) 0.9 % injection 10 mL   heparin flush 10 UNIT/ML injection 10 Units   Other Relevant Orders   Sickle Cell Panel (Completed)   Access Port A Cath  Urinalysis Dipstick (Completed)   Chronic pain syndrome       Relevant Medications   celecoxib (CELEBREX) 200 MG capsule   gabapentin (NEURONTIN) 300 MG capsule   mirtazapine (REMERON) 45 MG tablet   HYDROmorphone (DILAUDID) 4 MG tablet      Meds ordered this encounter  Medications  . albuterol (VENTOLIN HFA) 108 (90 Base) MCG/ACT inhaler    Sig: Inhale 2 puffs into the lungs 2 (two) times daily as needed for wheezing or shortness of breath.    Dispense:  8 g    Refill:  11    Order Specific Question:   Supervising Provider    Answer:   [name] [id number]  . celecoxib  (CELEBREX) 200 MG capsule    Sig: Take 1 capsule (200 mg total) by mouth daily.    Dispense:  90 capsule    Refill:  3    Order Specific Question:   Supervising Provider    Answer:   [name] [id number]  . Cholecalciferol (VITAMIN D3) 25 MCG (1000 UT) CAPS    Sig: Take 1 capsule (1,000 Units total) by mouth daily.    Dispense:  90 capsule    Refill:  3    Order Specific Question:   Supervising Provider    Answer:   [name] [id number]  . folic acid (FOLVITE) 1 MG tablet    Sig: Take 1 tablet (1 mg total) by mouth daily.    Dispense:  90 tablet    Refill:  3    Order Specific Question:   Supervising Provider    Answer:   [name] [id number]  . gabapentin (NEURONTIN) 300 MG capsule    Sig: Take 1 capsule (300 mg total) by mouth 3 (three) times daily.    Dispense:  270 capsule    Refill:  3    Order Specific Question:   Supervising Provider    Answer:   [name] [id number]  . mometasone-formoterol (DULERA) 100-5 MCG/ACT AERO    Sig: Inhale 2 puffs into the lungs daily as needed for wheezing or shortness of breath.    Dispense:  1 each    Refill:  5    Order Specific Question:   Supervising Provider    Answer:   [name] [id number]  . omeprazole (PRILOSEC) 20 MG capsule    Sig: Take 1 capsule (20 mg total) by mouth daily.    Dispense:  90 capsule    Refill:  3    Order Specific Question:   Supervising Provider    Answer:   [name] [id number]  . promethazine (PHENERGAN) 25 MG tablet    Sig: Take 0.5-1 tablets (12.5-25 mg total) by mouth every 6 (six) hours as needed for nausea or vomiting.    Dispense:  30 tablet    Refill:  11    Order Specific Question:   Supervising Provider    Answer:   [name] [id number]  . vitamin B-12 (CYANOCOBALAMIN) 1000 MCG tablet    Sig: Take 1 tablet (1,000 mcg total) by mouth daily.    Dispense:  90 tablet    Refill:  3    Order Specific Question:   Supervising  Provider    Answer:   [name] [id number]  . XARELTO 20 MG TABS tablet    Sig: Take 1 tablet (20 mg total) by mouth daily with supper.    Dispense:  90 tablet  Refill:  3    Order Specific Question:   Supervising Provider    Answer:   [name] [id number]  . mirtazapine (REMERON) 45 MG tablet    Sig: Take 1 tablet (45 mg total) by mouth at bedtime.    Dispense:  90 tablet    Refill:  3    Order Specific Question:   Supervising Provider    Answer:   [name] [id number]  . Deferasirox (JADENU) 360 MG TABS    Sig: Take 3 tablets (1,080 mg total) by mouth daily before breakfast.    Dispense:  90 tablet    Refill:  11    Order Specific Question:   Supervising Provider    Answer:   [name] [[id number]]  . sodium chloride (PF) 0.9 % injection 10 mL  . heparin flush 10 UNIT/ML injection 10 Units  . HYDROmorphone (DILAUDID) 4 MG tablet    Sig: Take 1 tablet (4 mg total) by mouth every 6 (six) hours as needed for up to 15 days for moderate pain or severe pain.    Dispense:  60 tablet    Refill:  0    Order Specific Question:   Supervising Provider    Answer:   [name] [[id number]]    Follow-up: Return in about 2 months (around 06/13/2020).    Vevelyn Francois, NP

## 2020-04-13 NOTE — Progress Notes (Signed)
  Provider: Dionisio David NP     Procedure: Port-a-cath flush and lab draw     Note: Patient's PAC accessed, flushed with 10 cc 0.9% sodium chloride and 500 units of heparin as ordered by Dionisio David NP using sterile technique. Blood return noted. PAC de-accessed and site covered with band aid. Patient tolerated well,  declined printed AVS, alert, oriented and ambulatory at discharge.

## 2020-04-14 LAB — CMP14+CBC/D/PLT+FER+RETIC+V...
ALT: 27 IU/L (ref 0–32)
AST: 33 IU/L (ref 0–40)
Albumin/Globulin Ratio: 1.5 (ref 1.2–2.2)
Albumin: 4.9 g/dL — ABNORMAL HIGH (ref 3.8–4.8)
Alkaline Phosphatase: 83 IU/L (ref 44–121)
BUN/Creatinine Ratio: 15 (ref 9–23)
BUN: 8 mg/dL (ref 6–20)
Basophils Absolute: 0.1 10*3/uL (ref 0.0–0.2)
Basos: 1 %
Bilirubin Total: 2.9 mg/dL — ABNORMAL HIGH (ref 0.0–1.2)
CO2: 20 mmol/L (ref 20–29)
Calcium: 9.3 mg/dL (ref 8.7–10.2)
Chloride: 102 mmol/L (ref 96–106)
Creatinine, Ser: 0.55 mg/dL — ABNORMAL LOW (ref 0.57–1.00)
EOS (ABSOLUTE): 0.1 10*3/uL (ref 0.0–0.4)
Eos: 1 %
Ferritin: 4163 ng/mL — ABNORMAL HIGH (ref 15–150)
GFR calc Af Amer: 138 mL/min/{1.73_m2} (ref >59)
GFR calc non Af Amer: 119 mL/min/{1.73_m2} (ref >59)
Globulin, Total: 3.2 g/dL (ref 1.5–4.5)
Glucose: 96 mg/dL (ref 65–99)
Hematocrit: 25.5 % — ABNORMAL LOW (ref 34.0–46.6)
Hemoglobin: 8.6 g/dL — ABNORMAL LOW (ref 11.1–15.9)
Immature Grans (Abs): 0.1 10*3/uL (ref 0.0–0.1)
Immature Granulocytes: 1 %
Lymphocytes Absolute: 3 10*3/uL (ref 0.7–3.1)
Lymphs: 19 %
MCH: 29.8 pg (ref 26.6–33.0)
MCHC: 33.7 g/dL (ref 31.5–35.7)
MCV: 88 fL (ref 79–97)
Monocytes Absolute: 2.1 10*3/uL — ABNORMAL HIGH (ref 0.1–0.9)
Monocytes: 13 %
Neutrophils Absolute: 11 10*3/uL — ABNORMAL HIGH (ref 1.4–7.0)
Neutrophils: 65 %
Platelets: 648 10*3/uL — ABNORMAL HIGH (ref 150–450)
Potassium: 3.6 mmol/L (ref 3.5–5.2)
RBC: 2.89 x10E6/uL — ABNORMAL LOW (ref 3.77–5.28)
RDW: 16.5 % — ABNORMAL HIGH (ref 11.7–15.4)
Retic Ct Pct: 3.1 % — ABNORMAL HIGH (ref 0.6–2.6)
Sodium: 137 mmol/L (ref 134–144)
Total Protein: 8.1 g/dL (ref 6.0–8.5)
Vit D, 25-Hydroxy: 15.6 ng/mL — ABNORMAL LOW (ref 30.0–100.0)
WBC: 16.4 10*3/uL — ABNORMAL HIGH (ref 3.4–10.8)

## 2020-04-17 ENCOUNTER — Other Ambulatory Visit: Payer: Self-pay | Admitting: Nurse Practitioner

## 2020-04-17 MED ORDER — BUDESONIDE-FORMOTEROL FUMARATE 80-4.5 MCG/ACT IN AERO: 2.0000 | INHALATION_SPRAY | Freq: Two times a day (BID) | RESPIRATORY_TRACT | 3 refills | Status: DC

## 2020-04-18 IMAGING — DX DG CHEST 1V PORT
1 series · 1 of 1 positions shown · non-contrast
Comparison: 06/08/2019

CLINICAL DATA: Sickle cell pain crisis

EXAM:
PORTABLE CHEST 1 VIEW

[chest ap]
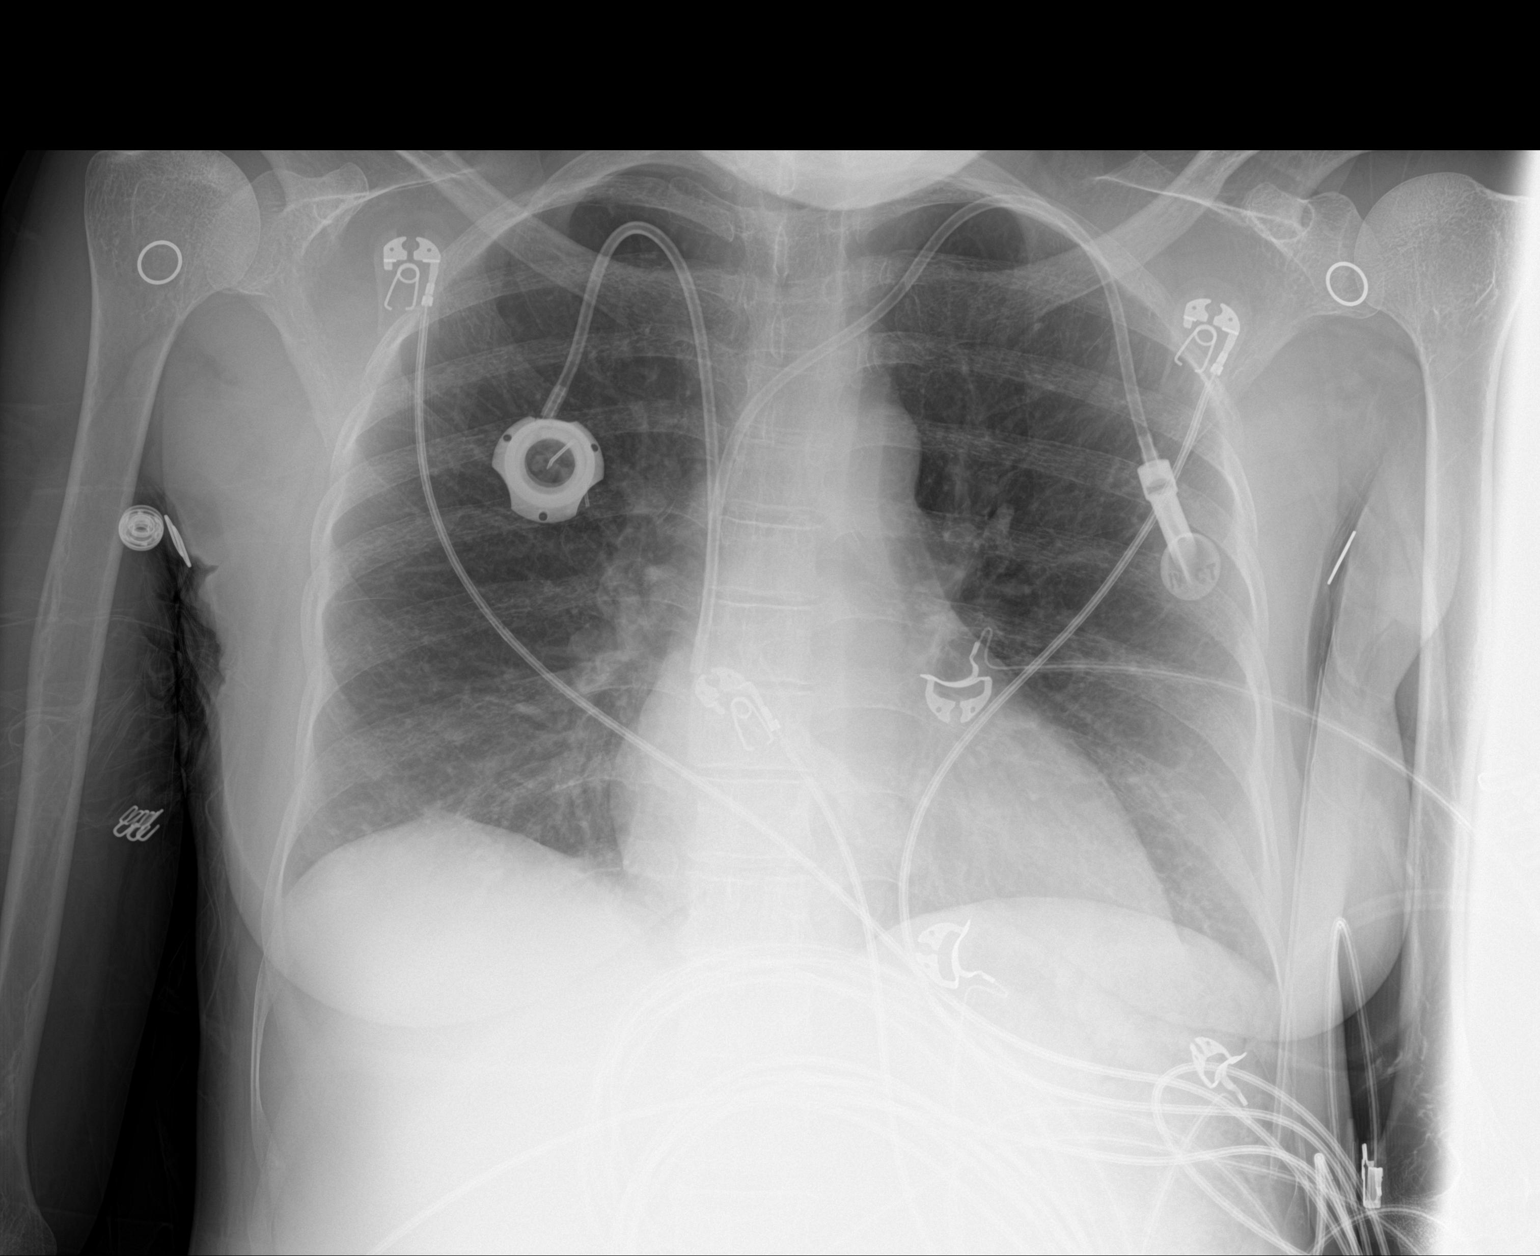

[1 of 1 positions shown; findings below may reference images not displayed]

FINDINGS: Chronic interstitial coarsening. There is no edema, consolidation,
effusion, or pneumothorax. Port from bilateral approach with tips in
good position about the upper cavoatrial junction. Normal heart size
and mediastinal contours.
IMPRESSION: No evidence of active disease.

## 2020-04-21 ENCOUNTER — Encounter (HOSPITAL_COMMUNITY): Payer: Self-pay | Admitting: Family Medicine

## 2020-04-21 ENCOUNTER — Telehealth (HOSPITAL_COMMUNITY): Payer: Self-pay

## 2020-04-21 ENCOUNTER — Non-Acute Institutional Stay (HOSPITAL_COMMUNITY)
Admission: AD | Admit: 2020-04-21 | Discharge: 2020-04-21 | Disposition: A | Discharge status: Home / Self Care | Payer: Medicare Other | Source: Ambulatory Visit | Attending: Internal Medicine | Admitting: Internal Medicine

## 2020-04-21 DIAGNOSIS — M545 Low back pain, unspecified: Secondary | ICD-10-CM | POA: Diagnosis present

## 2020-04-21 DIAGNOSIS — G894 Chronic pain syndrome: Secondary | ICD-10-CM | POA: Insufficient documentation

## 2020-04-21 DIAGNOSIS — D5700 Hb-SS disease with crisis, unspecified: Secondary | ICD-10-CM | POA: Diagnosis not present

## 2020-04-21 LAB — RETICULOCYTES
Immature Retic Fract: 25.9 % — ABNORMAL HIGH (ref 2.3–15.9)
RBC.: 2.56 MIL/uL — ABNORMAL LOW (ref 3.87–5.11)
Retic Count, Absolute: 321.3 10*3/uL — ABNORMAL HIGH (ref 19.0–186.0)
Retic Ct Pct: 12.6 % — ABNORMAL HIGH (ref 0.4–3.1)

## 2020-04-21 LAB — PREGNANCY, URINE: Preg Test, Ur: NEGATIVE

## 2020-04-21 LAB — CBC WITH DIFFERENTIAL/PLATELET
Abs Immature Granulocytes: 0.22 10*3/uL — ABNORMAL HIGH (ref 0.00–0.07)
Basophils Absolute: 0.1 10*3/uL (ref 0.0–0.1)
Basophils Relative: 1 %
Eosinophils Absolute: 0.4 10*3/uL (ref 0.0–0.5)
Eosinophils Relative: 3 %
HCT: 22.9 % — ABNORMAL LOW (ref 36.0–46.0)
Hemoglobin: 7.6 g/dL — ABNORMAL LOW (ref 12.0–15.0)
Immature Granulocytes: 1 %
Lymphocytes Relative: 16 %
Lymphs Abs: 2.5 10*3/uL (ref 0.7–4.0)
MCH: 30.2 pg (ref 26.0–34.0)
MCHC: 33.2 g/dL (ref 30.0–36.0)
MCV: 90.9 fL (ref 80.0–100.0)
Monocytes Absolute: 2.3 10*3/uL — ABNORMAL HIGH (ref 0.1–1.0)
Monocytes Relative: 14 %
Neutro Abs: 10.7 10*3/uL — ABNORMAL HIGH (ref 1.7–7.7)
Neutrophils Relative %: 65 %
Platelets: 595 10*3/uL — ABNORMAL HIGH (ref 150–400)
RBC: 2.52 MIL/uL — ABNORMAL LOW (ref 3.87–5.11)
RDW: 19.1 % — ABNORMAL HIGH (ref 11.5–15.5)
WBC: 16.3 10*3/uL — ABNORMAL HIGH (ref 4.0–10.5)
nRBC: 1.4 % — ABNORMAL HIGH (ref 0.0–0.2)

## 2020-04-21 LAB — COMPREHENSIVE METABOLIC PANEL
ALT: 32 U/L (ref 0–44)
AST: 46 U/L — ABNORMAL HIGH (ref 15–41)
Albumin: 4.1 g/dL (ref 3.5–5.0)
Alkaline Phosphatase: 57 U/L (ref 38–126)
Anion gap: 8 (ref 5–15)
BUN: 7 mg/dL (ref 6–20)
CO2: 23 mmol/L (ref 22–32)
Calcium: 8.3 mg/dL — ABNORMAL LOW (ref 8.9–10.3)
Chloride: 106 mmol/L (ref 98–111)
Creatinine, Ser: 0.48 mg/dL (ref 0.44–1.00)
GFR, Estimated: >60 mL/min (ref >60)
Glucose, Bld: 98 mg/dL (ref 70–99)
Potassium: 3.6 mmol/L (ref 3.5–5.1)
Sodium: 137 mmol/L (ref 135–145)
Total Bilirubin: 3.4 mg/dL — ABNORMAL HIGH (ref 0.3–1.2)
Total Protein: 7.1 g/dL (ref 6.5–8.1)

## 2020-04-21 MED ORDER — KETOROLAC TROMETHAMINE 30 MG/ML IJ SOLN
15.0000 mg | Freq: Once | INTRAMUSCULAR | Status: AC
  Administered 2020-04-21: 15 mg via INTRAVENOUS
  Filled 2020-04-21: qty 1

## 2020-04-21 MED ORDER — ONDANSETRON HCL 4 MG/2ML IJ SOLN
4.0000 mg | Freq: Four times a day (QID) | INTRAMUSCULAR | Status: DC | PRN
  Administered 2020-04-21: 4 mg via INTRAVENOUS
  Filled 2020-04-21: qty 2

## 2020-04-21 MED ORDER — ACETAMINOPHEN 500 MG PO TABS
1000.0000 mg | ORAL_TABLET | Freq: Once | ORAL | Status: AC
  Administered 2020-04-21: 1000 mg via ORAL
  Filled 2020-04-21: qty 2

## 2020-04-21 MED ORDER — DIPHENHYDRAMINE HCL 25 MG PO CAPS
25.0000 mg | ORAL_CAPSULE | ORAL | Status: DC | PRN
  Administered 2020-04-21: 25 mg via ORAL
  Filled 2020-04-21: qty 1

## 2020-04-21 MED ORDER — NALOXONE HCL 0.4 MG/ML IJ SOLN: 0.4000 mg | INTRAMUSCULAR | Status: DC | PRN

## 2020-04-21 MED ORDER — SODIUM CHLORIDE 0.9% FLUSH: 9.0000 mL | INTRAVENOUS | Status: DC | PRN

## 2020-04-21 MED ORDER — HYDROMORPHONE 1 MG/ML IV SOLN
INTRAVENOUS | Status: DC
  Administered 2020-04-21: 6.5 mg via INTRAVENOUS
  Filled 2020-04-21: qty 25

## 2020-04-21 MED ORDER — SODIUM CHLORIDE 0.45 % IV SOLN: INTRAVENOUS | Status: DC

## 2020-04-21 MED ORDER — HEPARIN SOD (PORK) LOCK FLUSH 100 UNIT/ML IV SOLN
500.0000 [IU] | INTRAVENOUS | Status: DC | PRN
  Administered 2020-04-21: 500 [IU]
  Filled 2020-04-21: qty 5

## 2020-04-21 MED ORDER — SODIUM CHLORIDE 0.9 % IV SOLN
25.0000 mg | INTRAVENOUS | Status: DC | PRN
  Filled 2020-04-21: qty 0.5

## 2020-04-21 NOTE — Telephone Encounter (Signed)
Pt called requesting treatment in day hospital. Reports pain related to sickle cell disease , rates pain 8/10 to lower back that started overnight. Pt reports taking dilaudid 4mg  at 6:30am today. Pt states she has not been seen in the ED recently. Denies fever, flu like symptoms, Covid 19 screening completed and negative for symptoms. Denies chest pain, vomiting, diarrhea or abdominal pain . Voices some nausea. Pt states she has transport to center other than herself. [place] FNP notified and approved for pt to come to center for treatment. Pt made aware and expresses understanding.

## 2020-04-21 NOTE — Progress Notes (Signed)
Patient admitted to the day hospital for treatment of sickle cell pain crisis. Patient reported pain rated 10/10 in the back. Patient placed on Dilaudid PCA, given PO tylenol, IV Toradol, PO Benadryl,  IV Zofran  and hydrated with IV fluids. At discharge patient reported  pain at 6/10. Discharge instructions given to patient. Alert, oriented and ambulatory at discharge.

## 2020-04-21 NOTE — Discharge Instructions (Signed)
Sickle Cell Anemia, Adult  Sickle cell anemia is a condition where your red blood cells are shaped like sickles. Red blood cells carry oxygen through the body. Sickle-shaped cells do not live as long as normal red blood cells. They also clump together and block blood from flowing through the blood vessels. This prevents the body from getting enough oxygen. Sickle cell anemia causes organ damage and pain. It also increases the risk of infection. Follow these instructions at home: Medicines  Take over-the-counter and prescription medicines only as told by your doctor.  If you were prescribed an antibiotic medicine, take it as told by your doctor. Do not stop taking the antibiotic even if you start to feel better.  If you develop a fever, do not take medicines to lower the fever right away. Tell your doctor about the fever. Managing pain, stiffness, and swelling  Try these methods to help with pain: ? Use a heating pad. ? Take a warm bath. ? Distract yourself, such as by watching TV. Eating and drinking  Drink enough fluid to keep your pee (urine) clear or pale yellow. Drink more in hot weather and during exercise.  Limit or avoid alcohol.  Eat a healthy diet. Eat plenty of fruits, vegetables, whole grains, and lean protein.  Take vitamins and supplements as told by your doctor. Traveling  When traveling, keep these with you: ? Your medical information. ? The names of your doctors. ? Your medicines.  If you need to take an airplane, talk to your doctor first. Activity  Rest often.  Avoid exercises that make your heart beat much faster, such as jogging. General instructions  Do not use products that have nicotine or tobacco, such as cigarettes and e-cigarettes. If you need help quitting, ask your doctor.  Consider wearing a medical alert bracelet.  Avoid being in high places (high altitudes), such as mountains.  Avoid very hot or cold temperatures.  Avoid places where the  temperature changes a lot.  Keep all follow-up visits as told by your doctor. This is important. Contact a doctor if:  A joint hurts.  Your feet or hands hurt or swell.  You feel tired (fatigued). Get help right away if:  You have symptoms of infection. These include: ? Fever. ? Chills. ? Being very tired. ? Irritability. ? Poor eating. ? Throwing up (vomiting).  You feel dizzy or faint.  You have new stomach pain, especially on the left side.  You have a an erection (priapism) that lasts more than 4 hours.  You have numbness in your arms or legs.  You have a hard time moving your arms or legs.  You have trouble talking.  You have pain that does not go away when you take medicine.  You are short of breath.  You are breathing fast.  You have a long-term cough.  You have pain in your chest.  You have a bad headache.  You have a stiff neck.  Your stomach looks bloated even though you did not eat much.  Your skin is pale.  You suddenly cannot see well. Summary  Sickle cell anemia is a condition where your red blood cells are shaped like sickles.  Follow your doctor's advice on ways to manage pain, food to eat, activities to do, and steps to take for safe travel.  Get medical help right away if you have any signs of infection, such as a fever. This information is not intended to replace advice given to you by   your health care provider. Make sure you discuss any questions you have with your health care provider. Document Revised: 09/25/2018 Document Reviewed: 07/09/2016 Elsevier Patient Education  2020 Elsevier Inc.  

## 2020-04-21 NOTE — Discharge Summary (Signed)
[facility] Discharge Summary   Patient ID: Molly Gutierrez MRN: [id number] DOB/AGE: 08/28/1980 39 y.o.  Admit date: 04/21/2020 Discharge date: 04/21/2020  Primary Care Physician:  Vevelyn Francois, NP  Admission Diagnoses:  Active Problems:   Sickle cell pain crisis [facility])   Discharge Medications:  Allergies as of 04/21/2020      Reactions   Cefaclor Hives, Swelling   Hydroxyurea Palpitations, Other (See Comments)   Lowers "blood levels" and heart rate (causes HYPOtension); "it messes me up, it drops my levels and stuff" Other reaction(s): Hypotension, Other (See Comments) "it messes me up, it drops my levels and stuff" Lower blood levels and HR Other reaction(s): Other (See Comments) Lowers "blood levels" and heart rate (causes HYPOtension); "it messes me up, it drops my levels and stuff"   Ketamine Palpitations, Other (See Comments)   "Pt states she has had previous reaction to ketamine. States she becomes flushed, heart races, dizzy, and feels like she is going to pass out." Other reaction(s): Other (See Comments), Other (See Comments) "Pt states she has had previous reaction to ketamine. States she becomes flushed, heart races, dizzy, and feels like she is going to pass out." "Pt states she has had previous reaction to ketamine. States she becomes flushed, heart races, dizzy, and feels like she is going to pass out."      Medication List    TAKE these medications   albuterol 108 (90 Base) MCG/ACT inhaler Commonly known as: VENTOLIN HFA Inhale 2 puffs into the lungs 2 (two) times daily as needed for wheezing or shortness of breath.   budesonide-formoterol 80-4.5 MCG/ACT inhaler Commonly known as: SYMBICORT Inhale 2 puffs into the lungs 2 (two) times daily.   celecoxib 200 MG capsule Commonly known as: CELEBREX Take 1 capsule (200 mg total) by mouth daily.   Deferasirox 360 MG Tabs Commonly known as: Jadenu Take 3 tablets (1,080 mg total) by  mouth daily before breakfast.   diphenhydrAMINE 25 mg capsule Commonly known as: BENADRYL Take 25 mg by mouth 3 (three) times daily as needed for itching.   folic acid 1 MG tablet Commonly known as: FOLVITE Take 1 tablet (1 mg total) by mouth daily.   gabapentin 300 MG capsule Commonly known as: NEURONTIN Take 1 capsule (300 mg total) by mouth 3 (three) times daily.   HYDROmorphone 4 MG tablet Commonly known as: DILAUDID Take 1 tablet (4 mg total) by mouth every 6 (six) hours as needed for up to 15 days for moderate pain or severe pain.   mirtazapine 15 MG tablet Commonly known as: REMERON Take 45 mg by mouth at bedtime.   mirtazapine 45 MG tablet Commonly known as: REMERON Take 1 tablet (45 mg total) by mouth at bedtime.   mometasone-formoterol 100-5 MCG/ACT Aero Commonly known as: DULERA Inhale 2 puffs into the lungs daily as needed for wheezing or shortness of breath.   omeprazole 20 MG capsule Commonly known as: PRILOSEC Take 1 capsule (20 mg total) by mouth daily.   promethazine 25 MG tablet Commonly known as: PHENERGAN Take 0.5-1 tablets (12.5-25 mg total) by mouth every 6 (six) hours as needed for nausea or vomiting.   vitamin B-12 1000 MCG tablet Commonly known as: CYANOCOBALAMIN Take 1 tablet (1,000 mcg total) by mouth daily.   Vitamin D3 25 MCG (1000 UT) Caps Take 1 capsule (1,000 Units total) by mouth daily.   Xarelto 20 MG Tabs tablet Generic drug: rivaroxaban Take 1 tablet (20 mg total) by mouth  daily with supper.        Consults:  None  Significant Diagnostic Studies:  No results found.  History of present illness:  Molly Gutierrez is a 39 year old female with a medical record significant for sickle cell disease, chronic pain syndrome, opiate dependence and tolerance, history of PE on Xarelto, and history of anemia of chronic disease presents complaining of low back and mid chest pain that is consistent with previous pain crisis.  Patient  attributes pain crisis to changes in weather.  She says that she awakened on yesterday morning with increased pain primarily to right lower extremity.  Pain intensity is 10/10 characterized as constant and aching.  Patient denies any headache, shortness of breath, fever, chills, urinary symptoms, nausea, vomiting, or diarrhea.  No sick contacts, recent travel, or exposure to COVID-19.  Of note, patient fully vaccinated against COVID-19. [facility] Course: Patient admitted to sickle cell day clinic for management of pain crisis.  Hemoglobin 7.6, consistent with patient's baseline, no clinical indication for blood transfusion on today.  WBCs 16.3, appear to be chronically elevated, no antibiotics warranted on today. Pain managed with IV Dilaudid via PCA with settings of 0.5 mg, 10-minute lockout, and 3 mg/h, Toradol 15 mg IV x1,and Tylenol 1000 mg x 1.  IV fluids, 0.45% saline at 125 mL/h Pain intensity decreased to 6/10.  Patient states that she can manage at home on current medication regimen.  Advised to follow-up with PCP for medication management.  Patient alert, oriented, and ambulating without assistance.  Patient will discharge home in a hemodynamically stable condition  Discharge instructions: Resume all home medications.   Follow up with PCP as previously  scheduled.   Discussed the importance of drinking 64 ounces of water daily, dehydration of red blood cells may lead further sickling.   Avoid all stressors that precipitate sickle cell pain crisis.     The patient was given clear instructions to go to ER or return to medical center if symptoms do not improve, worsen or new problems develop.    Physical Exam at Discharge:  BP 97/61 (BP Location: Right Arm)   Pulse 90   Temp 99.4 F (37.4 C) (Oral)   Resp 10   LMP 04/07/2020   SpO2 93%   Physical Exam Constitutional:      Appearance: Normal appearance.  Eyes:     Pupils: Pupils are equal, round, and reactive  to light.  Cardiovascular:     Rate and Rhythm: Normal rate and regular rhythm.  Pulmonary:     Effort: Pulmonary effort is normal.  Abdominal:     General: Abdomen is flat. Bowel sounds are normal.  Musculoskeletal:        General: Normal range of motion.  Skin:    General: Skin is warm.  Neurological:     General: No focal deficit present.     Mental Status: She is alert. Mental status is at baseline.  Psychiatric:        Mood and Affect: Mood normal.        Behavior: Behavior normal.        Thought Content: Thought content normal.        Judgment: Judgment normal.      Disposition at Discharge: Discharge disposition: 01-Home or Self Care       Discharge Orders: Discharge Instructions    Discharge patient   Complete by: As directed    Discharge disposition: 01-Home or Self Care   Discharge  patient date: 04/21/2020      Condition at Discharge:   Stable  Time spent on Discharge:  Greater than 30 minutes.  Signed: [name]  APRN, MSN, FNP-C Patient [facility] [address] [place], [place] [address] [phone]  04/21/2020, 4:11 PM

## 2020-04-21 NOTE — H&P (Signed)
[facility] History and Physical   Date: 04/21/2020  Patient name: Molly Gutierrez Medical record number: [id number] Date of birth: 1980/10/27 Age: 39 y.o. Gender: female PCP: Vevelyn Francois, NP  Attending physician: [name], MD  Chief Complaint: Sickle cell pain   History of Present Illness: Molly Gutierrez is a 39 year old female with a medical record significant for sickle cell disease, chronic pain syndrome, opiate dependence and tolerance, history of PE on Xarelto, and history of anemia of chronic disease presents complaining of low back and mid chest pain that is consistent with previous pain crisis.  Patient attributes pain crisis to changes in weather.  She says that she awakened on yesterday morning with increased pain primarily to right lower extremity.  Pain intensity is 10/10 characterized as constant and aching.  Patient denies any headache, shortness of breath, fever, chills, urinary symptoms, nausea, vomiting, or diarrhea.  No sick contacts, recent travel, or exposure to COVID-19.  Of note, patient fully vaccinated against COVID-19.  Meds: Facility-Administered Medications Prior to Admission  Medication Dose Route Frequency Provider Last Rate Last Admin  . heparin flush 10 UNIT/ML injection 10 Units  10 Units Intracatheter Once Vevelyn Francois, NP      . sodium chloride (PF) 0.9 % injection 10 mL  10 mL Intravenous Once Vevelyn Francois, NP       Medications Prior to Admission  Medication Sig Dispense Refill Last Dose  . albuterol (VENTOLIN HFA) 108 (90 Base) MCG/ACT inhaler Inhale 2 puffs into the lungs 2 (two) times daily as needed for wheezing or shortness of breath. 8 g 11   . budesonide-formoterol (SYMBICORT) 80-4.5 MCG/ACT inhaler Inhale 2 puffs into the lungs 2 (two) times daily. 1 each 3   . celecoxib (CELEBREX) 200 MG capsule Take 1 capsule (200 mg total) by mouth daily. 90 capsule 3   . Cholecalciferol (VITAMIN D3) 25 MCG (1000  UT) CAPS Take 1 capsule (1,000 Units total) by mouth daily. 90 capsule 3   . Deferasirox (JADENU) 360 MG TABS Take 3 tablets (1,080 mg total) by mouth daily before breakfast. 90 tablet 11   . diphenhydrAMINE (BENADRYL) 25 mg capsule Take 25 mg by mouth 3 (three) times daily as needed for itching.     . folic acid (FOLVITE) 1 MG tablet Take 1 tablet (1 mg total) by mouth daily. 90 tablet 3   . gabapentin (NEURONTIN) 300 MG capsule Take 1 capsule (300 mg total) by mouth 3 (three) times daily. 270 capsule 3   . HYDROmorphone (DILAUDID) 4 MG tablet Take 1 tablet (4 mg total) by mouth every 6 (six) hours as needed for up to 15 days for moderate pain or severe pain. 60 tablet 0   . mirtazapine (REMERON) 15 MG tablet Take 45 mg by mouth at bedtime.      . mirtazapine (REMERON) 45 MG tablet Take 1 tablet (45 mg total) by mouth at bedtime. 90 tablet 3   . mometasone-formoterol (DULERA) 100-5 MCG/ACT AERO Inhale 2 puffs into the lungs daily as needed for wheezing or shortness of breath. 1 each 5   . omeprazole (PRILOSEC) 20 MG capsule Take 1 capsule (20 mg total) by mouth daily. 90 capsule 3   . promethazine (PHENERGAN) 25 MG tablet Take 0.5-1 tablets (12.5-25 mg total) by mouth every 6 (six) hours as needed for nausea or vomiting. 30 tablet 11   . vitamin B-12 (CYANOCOBALAMIN) 1000 MCG tablet Take 1 tablet (1,000 mcg total)  by mouth daily. 90 tablet 3   . XARELTO 20 MG TABS tablet Take 1 tablet (20 mg total) by mouth daily with supper. 90 tablet 3     Allergies: Cefaclor, Hydroxyurea, and Ketamine Past Medical History:  Diagnosis Date  . Asthma   . Eczema   . History of pulmonary embolus (PE)   . Sickle cell anemia (HCC)    Past Surgical History:  Procedure Laterality Date  . CHOLECYSTECTOMY    . ERCP    . JOINT REPLACEMENT    . PORTA CATH INSERTION    . TUBAL LIGATION    . WISDOM TOOTH EXTRACTION     Family History  Problem Relation Age of Onset  . Renal Disease Mother   . Hypertension  Mother   . High Cholesterol Mother    Social History   Socioeconomic History  . Marital status: Single    Spouse name: Not on file  . Number of children: Not on file  . Years of education: Not on file  . Highest education level: Not on file  Occupational History  . Not on file  Tobacco Use  . Smoking status: Never Smoker  . Smokeless tobacco: Never Used  Vaping Use  . Vaping Use: Never used  Substance and Sexual Activity  . Alcohol use: Never  . Drug use: Never  . Sexual activity: Not on file  Other Topics Concern  . Not on file  Social History Narrative  . Not on file   Social Determinants of Health   Financial Resource Strain:   . Difficulty of Paying Living Expenses: Not on file  Food Insecurity:   . Worried About Charity fundraiser in the Last Year: Not on file  . Ran Out of Food in the Last Year: Not on file  Transportation Needs:   . Lack of Transportation (Medical): Not on file  . Lack of Transportation (Non-Medical): Not on file  Physical Activity:   . Days of Exercise per Week: Not on file  . Minutes of Exercise per Session: Not on file  Stress:   . Feeling of Stress : Not on file  Social Connections:   . Frequency of Communication with Friends and Family: Not on file  . Frequency of Social Gatherings with Friends and Family: Not on file  . Attends Religious Services: Not on file  . Active Member of Clubs or Organizations: Not on file  . Attends Archivist Meetings: Not on file  . Marital Status: Not on file  Intimate Partner Violence:   . Fear of Current or Ex-Partner: Not on file  . Emotionally Abused: Not on file  . Physically Abused: Not on file  . Sexually Abused: Not on file   Review of Systems  Constitutional: Negative for chills and fever.  HENT: Negative.   Eyes: Negative.   Respiratory: Negative.   Gastrointestinal: Negative.   Genitourinary: Negative.   Musculoskeletal: Positive for back pain and joint pain.  Neurological:  Negative.   Endo/Heme/Allergies: Negative.   Psychiatric/Behavioral: Negative.    Physical Exam: Last menstrual period 04/01/2020. Physical Exam Constitutional:      Appearance: Normal appearance.  Eyes:     Pupils: Pupils are equal, round, and reactive to light.  Cardiovascular:     Rate and Rhythm: Normal rate and regular rhythm.     Pulses: Normal pulses.  Pulmonary:     Effort: Pulmonary effort is normal.  Abdominal:     General: Abdomen is flat. Bowel  sounds are normal.  Neurological:     General: No focal deficit present.     Mental Status: She is alert. Mental status is at baseline.  Psychiatric:        Mood and Affect: Mood normal.        Behavior: Behavior normal.        Thought Content: Thought content normal.        Judgment: Judgment normal.     Lab results: No results found for this or any previous visit (from the past 24 hour(s)).  Imaging results:  No results found.   Assessment & Plan: Patient admitted to sickle cell day infusion [facility] for management of pain crisis.  Patient is opiate tolerant Initiate IV dilaudid PCA. Settings of 0.5 mg, 10 minute lockout and 3 mg/hr.  IV fluids, 0.45% saline at 100 ml/hr Toradol 15 mg IV times one dose Tylenol 1000 mg by mouth times one dose Review CBC with differential, complete metabolic panel, and reticulocytes as results become available. Baseline hemoglobin is Pain intensity will be reevaluated in context of functioning and relationship to baseline as care progress If pain intensity remains elevated and/or sudden change in hemodynamic stability transition to inpatient services for higher level of care.    [name]  APRN, MSN, FNP-C [facility] [address] [place], [place] [address] [phone]   04/21/2020, 10:43 AM

## 2020-04-24 ENCOUNTER — Emergency Department (HOSPITAL_COMMUNITY): Payer: Medicare Other

## 2020-04-24 ENCOUNTER — Other Ambulatory Visit: Payer: Self-pay

## 2020-04-24 ENCOUNTER — Inpatient Hospital Stay (HOSPITAL_COMMUNITY)
Admission: EM | Admit: 2020-04-24 | Discharge: 2020-04-28 | DRG: 812 | Disposition: A | Discharge status: Home / Self Care | Payer: Medicare Other | Attending: Internal Medicine | Admitting: Internal Medicine

## 2020-04-24 DIAGNOSIS — R0789 Other chest pain: Secondary | ICD-10-CM | POA: Diagnosis present

## 2020-04-24 DIAGNOSIS — J449 Chronic obstructive pulmonary disease, unspecified: Secondary | ICD-10-CM | POA: Diagnosis present

## 2020-04-24 DIAGNOSIS — Z86718 Personal history of other venous thrombosis and embolism: Secondary | ICD-10-CM

## 2020-04-24 DIAGNOSIS — Z79899 Other long term (current) drug therapy: Secondary | ICD-10-CM

## 2020-04-24 DIAGNOSIS — D72829 Elevated white blood cell count, unspecified: Secondary | ICD-10-CM | POA: Diagnosis present

## 2020-04-24 DIAGNOSIS — F1120 Opioid dependence, uncomplicated: Secondary | ICD-10-CM | POA: Diagnosis present

## 2020-04-24 DIAGNOSIS — D5700 Hb-SS disease with crisis, unspecified: Secondary | ICD-10-CM | POA: Diagnosis not present

## 2020-04-24 DIAGNOSIS — Z9049 Acquired absence of other specified parts of digestive tract: Secondary | ICD-10-CM

## 2020-04-24 DIAGNOSIS — K0889 Other specified disorders of teeth and supporting structures: Secondary | ICD-10-CM | POA: Diagnosis present

## 2020-04-24 DIAGNOSIS — Z888 Allergy status to other drugs, medicaments and biological substances status: Secondary | ICD-10-CM

## 2020-04-24 DIAGNOSIS — K219 Gastro-esophageal reflux disease without esophagitis: Secondary | ICD-10-CM | POA: Diagnosis present

## 2020-04-24 DIAGNOSIS — Z20822 Contact with and (suspected) exposure to covid-19: Secondary | ICD-10-CM | POA: Diagnosis present

## 2020-04-24 DIAGNOSIS — Z86711 Personal history of pulmonary embolism: Secondary | ICD-10-CM | POA: Diagnosis present

## 2020-04-24 DIAGNOSIS — Z7951 Long term (current) use of inhaled steroids: Secondary | ICD-10-CM

## 2020-04-24 DIAGNOSIS — Z7901 Long term (current) use of anticoagulants: Secondary | ICD-10-CM

## 2020-04-24 DIAGNOSIS — G894 Chronic pain syndrome: Secondary | ICD-10-CM | POA: Diagnosis present

## 2020-04-24 DIAGNOSIS — Z9851 Tubal ligation status: Secondary | ICD-10-CM

## 2020-04-24 LAB — CBC WITH DIFFERENTIAL/PLATELET
Abs Immature Granulocytes: 0.13 10*3/uL — ABNORMAL HIGH (ref 0.00–0.07)
Basophils Absolute: 0.1 10*3/uL (ref 0.0–0.1)
Basophils Relative: 0 %
Eosinophils Absolute: 0.1 10*3/uL (ref 0.0–0.5)
Eosinophils Relative: 1 %
HCT: 26 % — ABNORMAL LOW (ref 36.0–46.0)
Hemoglobin: 8.7 g/dL — ABNORMAL LOW (ref 12.0–15.0)
Immature Granulocytes: 1 %
Lymphocytes Relative: 13 %
Lymphs Abs: 2.3 10*3/uL (ref 0.7–4.0)
MCH: 30.1 pg (ref 26.0–34.0)
MCHC: 33.5 g/dL (ref 30.0–36.0)
MCV: 90 fL (ref 80.0–100.0)
Monocytes Absolute: 2.7 10*3/uL — ABNORMAL HIGH (ref 0.1–1.0)
Monocytes Relative: 15 %
Neutro Abs: 13.2 10*3/uL — ABNORMAL HIGH (ref 1.7–7.7)
Neutrophils Relative %: 70 %
Platelets: 595 10*3/uL — ABNORMAL HIGH (ref 150–400)
RBC: 2.89 MIL/uL — ABNORMAL LOW (ref 3.87–5.11)
RDW: 19.2 % — ABNORMAL HIGH (ref 11.5–15.5)
WBC: 18.6 10*3/uL — ABNORMAL HIGH (ref 4.0–10.5)
nRBC: 0.5 % — ABNORMAL HIGH (ref 0.0–0.2)

## 2020-04-24 LAB — COMPREHENSIVE METABOLIC PANEL
ALT: 27 U/L (ref 0–44)
AST: 39 U/L (ref 15–41)
Albumin: 4.6 g/dL (ref 3.5–5.0)
Alkaline Phosphatase: 61 U/L (ref 38–126)
Anion gap: 7 (ref 5–15)
BUN: 9 mg/dL (ref 6–20)
CO2: 23 mmol/L (ref 22–32)
Calcium: 9.2 mg/dL (ref 8.9–10.3)
Chloride: 108 mmol/L (ref 98–111)
Creatinine, Ser: 0.52 mg/dL (ref 0.44–1.00)
GFR, Estimated: >60 mL/min (ref >60)
Glucose, Bld: 95 mg/dL (ref 70–99)
Potassium: 3.8 mmol/L (ref 3.5–5.1)
Sodium: 138 mmol/L (ref 135–145)
Total Bilirubin: 3.1 mg/dL — ABNORMAL HIGH (ref 0.3–1.2)
Total Protein: 8.1 g/dL (ref 6.5–8.1)

## 2020-04-24 LAB — RESPIRATORY PANEL BY RT PCR (FLU A&B, COVID)
Influenza A by PCR: NEGATIVE
Influenza B by PCR: NEGATIVE
SARS Coronavirus 2 by RT PCR: NEGATIVE

## 2020-04-24 LAB — RETICULOCYTES
Immature Retic Fract: 12.1 % (ref 2.3–15.9)
RBC.: 2.87 MIL/uL — ABNORMAL LOW (ref 3.87–5.11)
Retic Count, Absolute: 511.5 10*3/uL — ABNORMAL HIGH (ref 19.0–186.0)

## 2020-04-24 LAB — I-STAT BETA HCG BLOOD, ED (MC, WL, AP ONLY): I-stat hCG, quantitative: <5 m[IU]/mL (ref <5)

## 2020-04-24 LAB — TROPONIN I (HIGH SENSITIVITY): Troponin I (High Sensitivity): <2 ng/L (ref <18)

## 2020-04-24 MED ORDER — FOLIC ACID 1 MG PO TABS
1.0000 mg | ORAL_TABLET | Freq: Every day | ORAL | Status: DC
  Administered 2020-04-25 – 2020-04-28 (×4): 1 mg via ORAL
  Filled 2020-04-24 (×4): qty 1

## 2020-04-24 MED ORDER — ONDANSETRON HCL 4 MG/2ML IJ SOLN
4.0000 mg | INTRAMUSCULAR | Status: DC | PRN
  Administered 2020-04-24 – 2020-04-25 (×3): 4 mg via INTRAVENOUS
  Filled 2020-04-24 (×3): qty 2

## 2020-04-24 MED ORDER — AMOXICILLIN 250 MG/5ML PO SUSR
875.0000 mg | Freq: Two times a day (BID) | ORAL | Status: DC
  Administered 2020-04-25: 875 mg via ORAL
  Filled 2020-04-24 (×2): qty 20

## 2020-04-24 MED ORDER — MOMETASONE FURO-FORMOTEROL FUM 100-5 MCG/ACT IN AERO
2.0000 | INHALATION_SPRAY | Freq: Two times a day (BID) | RESPIRATORY_TRACT | Status: DC
  Administered 2020-04-25 – 2020-04-27 (×6): 2 via RESPIRATORY_TRACT
  Filled 2020-04-24: qty 8.8

## 2020-04-24 MED ORDER — DIPHENHYDRAMINE HCL 50 MG/ML IJ SOLN
25.0000 mg | Freq: Once | INTRAMUSCULAR | Status: AC
  Administered 2020-04-24: 25 mg via INTRAVENOUS
  Filled 2020-04-24: qty 1

## 2020-04-24 MED ORDER — HYDROMORPHONE HCL 2 MG/ML IJ SOLN
2.0000 mg | INTRAMUSCULAR | Status: AC
  Administered 2020-04-24: 2 mg via INTRAVENOUS

## 2020-04-24 MED ORDER — DEFERASIROX 360 MG PO TABS: 1080.0000 mg | ORAL_TABLET | Freq: Every day | ORAL | Status: DC

## 2020-04-24 MED ORDER — HYDROMORPHONE HCL 2 MG/ML IJ SOLN
2.0000 mg | INTRAMUSCULAR | Status: AC
  Administered 2020-04-24: 2 mg via INTRAVENOUS
  Filled 2020-04-24 (×2): qty 1

## 2020-04-24 MED ORDER — VITAMIN B-12 1000 MCG PO TABS
1000.0000 ug | ORAL_TABLET | Freq: Every day | ORAL | Status: DC
  Administered 2020-04-25 – 2020-04-28 (×4): 1000 ug via ORAL
  Filled 2020-04-24 (×4): qty 1

## 2020-04-24 MED ORDER — HYDROMORPHONE HCL 1 MG/ML IJ SOLN
1.0000 mg | INTRAMUSCULAR | Status: AC | PRN
  Administered 2020-04-25: 1 mg via INTRAVENOUS
  Filled 2020-04-24: qty 1

## 2020-04-24 MED ORDER — VITAMIN D3 25 MCG (1000 UNIT) PO TABS
1000.0000 [IU] | ORAL_TABLET | Freq: Every day | ORAL | Status: DC
  Administered 2020-04-25 – 2020-04-28 (×4): 1000 [IU] via ORAL
  Filled 2020-04-24 (×4): qty 1

## 2020-04-24 MED ORDER — SODIUM CHLORIDE 0.45 % IV SOLN: INTRAVENOUS | Status: DC

## 2020-04-24 MED ORDER — RIVAROXABAN 20 MG PO TABS
20.0000 mg | ORAL_TABLET | Freq: Every day | ORAL | Status: DC
  Administered 2020-04-25 – 2020-04-28 (×5): 20 mg via ORAL
  Filled 2020-04-24 (×6): qty 1

## 2020-04-24 MED ORDER — PANTOPRAZOLE SODIUM 40 MG PO TBEC
40.0000 mg | DELAYED_RELEASE_TABLET | Freq: Every day | ORAL | Status: DC
  Administered 2020-04-25 – 2020-04-28 (×4): 40 mg via ORAL
  Filled 2020-04-24 (×4): qty 1

## 2020-04-24 MED ORDER — CELECOXIB 200 MG PO CAPS
200.0000 mg | ORAL_CAPSULE | Freq: Every day | ORAL | Status: DC
  Administered 2020-04-25: 200 mg via ORAL
  Filled 2020-04-24: qty 1

## 2020-04-24 MED ORDER — GABAPENTIN 300 MG PO CAPS
300.0000 mg | ORAL_CAPSULE | Freq: Three times a day (TID) | ORAL | Status: DC
  Administered 2020-04-25 – 2020-04-28 (×12): 300 mg via ORAL
  Filled 2020-04-24 (×12): qty 1

## 2020-04-24 NOTE — H&P (Addendum)
History and Physical  [facility] VOZ:[id number] DOB: 12/26/1980 DOA: 04/24/2020  Referring physician: Dr. Zenia Resides  PCP: Vevelyn Francois, NP  Outpatient Specialists: Hematologist Patient coming from: Home.  Chief Complaint: Chest pain from sickle cell disease  HPI: Molly Gutierrez is a 39 y.o. female with medical history significant for sickle cell disease, chronic pain syndrome, opiate dependence and tolerance, pulmonary embolism on Xarelto, who presented to [facility] ED with complaints of chest pain, centrally located, aching and throbbing 8 out of 10, constant and nonradiating, with onset 2 days ago.  Patient was seen at the sickle cell clinic 2 days prior to her presentation for the same complaint where she was given IV fluid and IV pain medication.  She states she has been having right upper toothache additionally for which she has a dental appointment scheduled for 05/15/2020 at 1:30 PM for extraction.  She was started on amoxicillin on 04/17/20 x 10 days.  Reports temperature of 99.8 at home.  Denies any loss of appetite or change in oral intake.  First set of troponin in the ED negative, twelve-lead EKG no evidence of acute ischemia.  EDP requested admission for chest pain from sickle cell disease.  ED Course: Vital signs essentially unremarkable except for temperature with T-max 99.2.  Lab studies remarkable for WBC 18.6K.  Review of Systems: Review of systems as noted in the HPI. All other systems reviewed and are negative.   Past Medical History:  Diagnosis Date  . Asthma   . Eczema   . History of pulmonary embolus (PE)   . Sickle cell anemia (HCC)    Past Surgical History:  Procedure Laterality Date  . CHOLECYSTECTOMY    . ERCP    . JOINT REPLACEMENT    . PORTA CATH INSERTION    . TUBAL LIGATION    . WISDOM TOOTH EXTRACTION      Social History:  reports that she has never smoked. She has never used smokeless tobacco. She reports that she does not drink alcohol and  does not use drugs.   Allergies  Allergen Reactions  . Cefaclor Hives and Swelling  . Hydroxyurea Palpitations and Other (See Comments)    Lowers "blood levels" and heart rate (causes HYPOtension); "it messes me up, it drops my levels and stuff"  Other reaction(s): Hypotension, Other (See Comments) "it messes me up, it drops my levels and stuff" Lower blood levels and HR  Other reaction(s): Other (See Comments) Lowers "blood levels" and heart rate (causes HYPOtension); "it messes me up, it drops my levels and stuff"  . Ketamine Palpitations and Other (See Comments)    "Pt states she has had previous reaction to ketamine. States she becomes flushed, heart races, dizzy, and feels like she is going to pass out." Other reaction(s): Other (See Comments), Other (See Comments) "Pt states she has had previous reaction to ketamine. States she becomes flushed, heart races, dizzy, and feels like she is going to pass out." "Pt states she has had previous reaction to ketamine. States she becomes flushed, heart races, dizzy, and feels like she is going to pass out."    Family History  Problem Relation Age of Onset  . Renal Disease Mother   . Hypertension Mother   . High Cholesterol Mother       Prior to Admission medications   Medication Sig Start Date End Date Taking? Authorizing Provider  albuterol (VENTOLIN HFA) 108 (90 Base) MCG/ACT inhaler Inhale 2 puffs into the lungs 2 (two) times daily  as needed for wheezing or shortness of breath. 04/13/20 04/13/21 Yes Vevelyn Francois, NP  amoxicillin (AMOXIL) 875 MG tablet Take 875 mg by mouth 2 (two) times daily.  04/17/20  Yes [provider]  budesonide-formoterol (SYMBICORT) 80-4.5 MCG/ACT inhaler Inhale 2 puffs into the lungs 2 (two) times daily. 04/17/20  Yes Vevelyn Francois, NP  celecoxib (CELEBREX) 200 MG capsule Take 1 capsule (200 mg total) by mouth daily. 04/13/20 04/13/21 Yes Vevelyn Francois, NP  Cholecalciferol (VITAMIN D3) 25  MCG (1000 UT) CAPS Take 1 capsule (1,000 Units total) by mouth daily. 04/13/20 04/13/21 Yes Vevelyn Francois, NP  Deferasirox (JADENU) 360 MG TABS Take 3 tablets (1,080 mg total) by mouth daily before breakfast. 04/13/20 04/13/21 Yes Vevelyn Francois, NP  diphenhydrAMINE (BENADRYL) 25 mg capsule Take 25 mg by mouth 3 (three) times daily as needed for itching.   Yes [provider]  folic acid (FOLVITE) 1 MG tablet Take 1 tablet (1 mg total) by mouth daily. 04/13/20 04/13/21 Yes Vevelyn Francois, NP  gabapentin (NEURONTIN) 300 MG capsule Take 1 capsule (300 mg total) by mouth 3 (three) times daily. 04/13/20 04/13/21 Yes King, Diona Foley, NP  HYDROmorphone (DILAUDID) 4 MG tablet Take 1 tablet (4 mg total) by mouth every 6 (six) hours as needed for up to 15 days for moderate pain or severe pain. 04/13/20 04/28/20 Yes Vevelyn Francois, NP  mirtazapine (REMERON) 45 MG tablet Take 1 tablet (45 mg total) by mouth at bedtime. 04/13/20 04/13/21 Yes King, Diona Foley, NP  mometasone-formoterol (DULERA) 100-5 MCG/ACT AERO Inhale 2 puffs into the lungs daily as needed for wheezing or shortness of breath. 04/13/20 04/13/21 Yes Vevelyn Francois, NP  omeprazole (PRILOSEC) 20 MG capsule Take 1 capsule (20 mg total) by mouth daily. 04/13/20 04/13/21 Yes Vevelyn Francois, NP  promethazine (PHENERGAN) 25 MG tablet Take 0.5-1 tablets (12.5-25 mg total) by mouth every 6 (six) hours as needed for nausea or vomiting. 04/13/20 04/13/21 Yes Vevelyn Francois, NP  vitamin B-12 (CYANOCOBALAMIN) 1000 MCG tablet Take 1 tablet (1,000 mcg total) by mouth daily. 04/13/20 04/13/21 Yes King, Crystal M, NP  XARELTO 20 MG TABS tablet Take 1 tablet (20 mg total) by mouth daily with supper. 04/13/20 04/13/21 Yes Vevelyn Francois, NP    Physical Exam: BP 112/76   Pulse 73   Temp 99.2 F (37.3 C) (Oral)   Resp 18   Ht 5\' 3"  (1.6 m)   Wt 58.1 kg   LMP 04/03/2020   SpO2 97%   BMI 22.67 kg/m   . General: 39 y.o. year-old female well  developed well nourished in no acute distress.  Alert and oriented x3. . Cardiovascular: Regular rate and rhythm with no rubs or gallops.  No thyromegaly or JVD noted.  No lower extremity edema. 2/4 pulses in all 4 extremities. Marland Kitchen Respiratory: Clear to auscultation with no wheezes or rales. Good inspiratory effort. . Abdomen: Soft nontender nondistended with normal bowel sounds x4 quadrants. . Muskuloskeletal: No cyanosis, clubbing or edema noted bilaterally . Neuro: CN II-XII intact, strength, sensation, reflexes . Skin: No ulcerative lesions noted or rashes . Psychiatry: Judgement and insight appear normal. Mood is appropriate for condition and setting          Labs on Admission:  Basic Metabolic Panel: Recent Labs  Lab 04/21/20 1050 04/24/20 1856  NA 137 138  K 3.6 3.8  CL 106 108  CO2 23 23  GLUCOSE 98 95  BUN 7 9  CREATININE 0.48 0.52  CALCIUM 8.3* 9.2   Liver Function Tests: Recent Labs  Lab 04/21/20 1050 04/24/20 1856  AST 46* 39  ALT 32 27  ALKPHOS 57 61  BILITOT 3.4* 3.1*  PROT 7.1 8.1  ALBUMIN 4.1 4.6   No results for input(s): LIPASE, AMYLASE in the last 168 hours. No results for input(s): AMMONIA in the last 168 hours. CBC: Recent Labs  Lab 04/21/20 1050 04/24/20 1856  WBC 16.3* 18.6*  NEUTROABS 10.7* 13.2*  HGB 7.6* 8.7*  HCT 22.9* 26.0*  MCV 90.9 90.0  PLT 595* 595*   Cardiac Enzymes: No results for input(s): CKTOTAL, CKMB, CKMBINDEX, TROPONINI in the last 168 hours.  BNP (last 3 results) Recent Labs    05/05/19 0204  BNP 70.6    ProBNP (last 3 results) No results for input(s): PROBNP in the last 8760 hours.  CBG: No results for input(s): GLUCAP in the last 168 hours.  Radiological Exams on Admission: DG Chest Port 1 View  Result Date: 04/24/2020 CLINICAL DATA:  Chest pain EXAM: PORTABLE CHEST 1 VIEW COMPARISON:  02/19/2020 FINDINGS: Bilateral central venous catheters are again noted, stable from prior study. There is no  pneumothorax. No large pleural effusion. The heart size is stable. There is no acute osseous abnormality. IMPRESSION: No active disease. Electronically Signed   By: Constance Holster M.D.   On: 04/24/2020 20:37    EKG: I independently viewed the EKG done and my findings are as followed: Sinus tachycardia with a rate of 105.  No specific ST-T changes.  Assessment/Plan Present on Admission: . Sickle cell anemia with pain (HCC)  Active Problems:   Sickle cell anemia with pain (HCC)  Sickle cell disease with chest pain. Presented with chest pain, constant for 2 days Patient was seen at the sickle cell clinic 2 days prior to her presentation for the same complaint where she was given IV fluid and IV pain medication.  First set of troponin in the ED negative, twelve-lead EKG no evidence of acute ischemia. Monitor on telemetry Pain control IV fluid Resume home regimen.  Right upper tooth decay Scheduled for right upper tooth extraction on 05/15/2020 with her dentist Was prescribed amoxicillin on 04/17/20 x 10 days Switched to Unasyn No significant tenderness or edema on exam, no evidence of facial cellulitis Monitor.  If indicated obtain CT facial maxillary in the morning  Leukocytosis possibly reactive in the setting of sickle cell disease with pain Presented with WBC 18.6K. IV fluid Repeat CBC with differential in the morning  Isolated hyperbilirubinemia, suspect related to her sickle cell disease Alkaline phosphatase, AST and ALT normal Obtain total bilirubin, indirect and direct bilirubin in the morning Follow results  History of PE Resume Xarelto  GERD/chronic pain syndrome/COPD Resume home regimen.     DVT prophylaxis: Xarelto  Code Status: Full code as stated by the patient herself.  Family Communication: None at bedside  Disposition Plan: Admit to telemetry  Consults called: None.  Admission status: Observation status.   Status is:  Observation    Dispo:  Patient From: Home  Planned Disposition: Home  Expected discharge date: 04/25/20  Medically stable for discharge: No, ongoing management of sickle cell disease with pain.   Kayleen Memos MD [facility] Pager [phone]  If 7PM-7AM, please contact night-coverage [email or web address] Password [facility]  04/24/2020, 10:47 PM

## 2020-04-24 NOTE — ED Triage Notes (Signed)
Patient reports to the ER for chest pain and SCC. Patient reports it started on Thursday and has gotten worse since. Patient reports she has also has nausea. Denies SOB or Emesis or diarrhea,

## 2020-04-24 NOTE — ED Provider Notes (Addendum)
[facility] DEPT Provider Note   CSN: [id number] Arrival date & time: 04/24/20  1836     History Chief Complaint  Patient presents with  . Chest Pain  . Sickle Cell Pain Crisis    Molly Gutierrez is a 39 y.o. female.  39 year old female with history of sickle cell disease who presents with chest discomfort similar to her prior pain crisis.  Denies any fever cough or congestion.  Was seen in the sickle cell clinic few days ago for pain crisis and treated and discharged.  States that since that time, she continues to have discomfort.  Denies any urinary symptoms.  No abdominal comfort.  Pain unrelieved with her home medications        Past Medical History:  Diagnosis Date  . Asthma   . Eczema   . History of pulmonary embolus (PE)   . Sickle cell anemia [facility])     Patient Active Problem List   Diagnosis Date Noted  . Mild intermittent asthma 03/31/2020  . Sickle cell anemia with crisis ([place]) 01/07/2020  . Sickle cell crisis acute chest syndrome ([place]) 07/29/2019  . Drug-seeking behavior 07/07/2019  . Sinus tachycardia 07/07/2019  . Therapeutic opioid-induced constipation (OIC) 07/07/2019  . Breast mass in female 06/29/2019  . Atelectasis of right lung 06/29/2019  . Sickle cell crisis ([place]) 06/27/2019  . Sickle cell pain crisis ([place]) 04/19/2019  . Pneumonia 03/27/2019  . 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 Anterior chest wall pain 11/13/2018  . Epigastric abdominal pain 11/13/2018  . Hematuria 11/13/2018  . Hyperbilirubinemia 11/12/2018  . Long term current use of anticoagulant therapy 11/12/2018  . History of pulmonary embolism 09/26/2018  . Acute pulmonary embolism ([place]) 09/26/2018  . Opioid dependence ([place]) 09/13/2018  . Port-A-Cath in place 09/13/2018  . Narcotic abuse, continuous ([place]) 08/28/2018  . Anemia 07/03/2018  . Personal history of other venous thrombosis and embolism 07/03/2018  . History of transfusion 07/03/2018  .  Gastro-esophageal reflux disease without esophagitis 06/02/2018  . Asthma 05/19/2018  . Anxiety and depression 10/30/2017  . At risk for sepsis 06/13/2017  . Mitral regurgitation 02/01/2014  . Itching 12/13/2013  . Nausea & vomiting 12/13/2013  . 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 Frequent complaints of pain 11/01/2013  . Hypokalemia 09/19/2013  . S/P total hip arthroplasty 05/19/2013  . 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 Lower urinary tract infectious disease 03/02/2013  . Chest pain 11/14/2012  . Sickle-cell anemia ([place]) 10/30/2012  . Pain management 08/01/2012  . Reticulocytosis 07/31/2012  . 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 Leukocytosis 03/27/2012  . History of gestational diabetes 10/07/2011  . Hb-SS disease with crisis, unspecified ([place]) 06/25/2010    Past Surgical History:  Procedure Laterality Date  . CHOLECYSTECTOMY    . ERCP    . JOINT REPLACEMENT    . PORTA CATH INSERTION    . TUBAL LIGATION    . WISDOM TOOTH EXTRACTION       OB History   No obstetric history on file.     Family History  Problem Relation Age of Onset  . Renal Disease Mother   . Hypertension Mother   . High Cholesterol Mother     Social History   Tobacco Use  . Smoking status: Never Smoker  . Smokeless tobacco: Never Used  Vaping Use  . Vaping Use: Never used  Substance Use Topics  . Alcohol use: Never  . Drug use: Never    Home Medications Prior to Admission medications   Medication Sig Start Date End Date Taking? Authorizing Provider  albuterol (VENTOLIN HFA) 108 (90 Base) MCG/ACT inhaler Inhale 2 puffs into the lungs 2 (two) times daily as needed for wheezing or shortness of breath. 04/13/20 04/13/21 Yes Vevelyn Francois, NP  amoxicillin (AMOXIL) 875 MG tablet Take 875 mg by mouth 2 (two) times daily.   04/17/20  Yes [provider]  budesonide-formoterol (SYMBICORT) 80-4.5 MCG/ACT inhaler Inhale 2 puffs into the lungs 2 (two) times daily. 04/17/20  Yes Vevelyn Francois, NP  celecoxib (CELEBREX) 200 MG capsule Take 1 capsule (200 mg total) by mouth daily. 04/13/20 04/13/21 Yes Vevelyn Francois, NP  Cholecalciferol (VITAMIN D3) 25 MCG (1000 UT) CAPS Take 1 capsule (1,000 Units total) by mouth daily. 04/13/20 04/13/21 Yes Vevelyn Francois, NP  Deferasirox (JADENU) 360 MG TABS Take 3 tablets (1,080 mg total) by mouth daily before breakfast. 04/13/20 04/13/21 Yes Vevelyn Francois, NP  diphenhydrAMINE (BENADRYL) 25 mg capsule Take 25 mg by mouth 3 (three) times daily as needed for itching.   Yes [provider]  folic acid (FOLVITE) 1 MG tablet Take 1 tablet (1 mg total) by mouth daily. 04/13/20 04/13/21 Yes Vevelyn Francois, NP  gabapentin (NEURONTIN) 300 MG capsule Take 1 capsule (300 mg total) by mouth 3 (three) times daily. 04/13/20 04/13/21 Yes King, Diona Foley, NP  HYDROmorphone (DILAUDID) 4 MG tablet Take 1 tablet (4 mg total) by mouth every 6 (six) hours as needed for up to 15 days for moderate pain or severe pain. 04/13/20 04/28/20 Yes Vevelyn Francois, NP  mirtazapine (REMERON) 45 MG tablet Take 1 tablet (45 mg total) by mouth at bedtime. 04/13/20 04/13/21 Yes King, Diona Foley, NP  mometasone-formoterol (DULERA) 100-5 MCG/ACT AERO Inhale 2 puffs into the lungs daily as needed for wheezing or shortness of breath. 04/13/20 04/13/21 Yes Vevelyn Francois, NP  omeprazole (PRILOSEC) 20 MG capsule Take 1 capsule (20 mg total) by mouth daily. 04/13/20 04/13/21 Yes Vevelyn Francois, NP  promethazine (PHENERGAN) 25 MG tablet Take 0.5-1 tablets (12.5-25 mg total) by mouth every 6 (six) hours as needed for nausea or vomiting. 04/13/20 04/13/21 Yes Vevelyn Francois, NP  vitamin B-12 (CYANOCOBALAMIN) 1000 MCG tablet Take 1 tablet (1,000 mcg total) by mouth daily. 04/13/20 04/13/21 Yes King, Crystal M, NP    XARELTO 20 MG TABS tablet Take 1 tablet (20 mg total) by mouth daily with supper. 04/13/20 04/13/21 Yes Vevelyn Francois, NP    Allergies    Cefaclor, Hydroxyurea, and Ketamine  Review of Systems   Review of Systems  All other systems reviewed and are negative.   Physical Exam Updated Vital Signs BP (!) 88/61   Pulse 91   Temp 99.2 F (37.3 C) (Oral)   Resp (!) 23   Ht 1.6 m (5\' 3" )   Wt 58.1 kg   LMP 04/07/2020   SpO2 97%   BMI 22.67 kg/m   Physical Exam Vitals and nursing note reviewed.  Constitutional:      General: She is not in acute distress.    Appearance: Normal appearance. She is well-developed. She is not toxic-appearing.  HENT:     Head: Normocephalic and atraumatic.  Eyes:     General: Lids are normal.     Conjunctiva/sclera: Conjunctivae normal.     Pupils: Pupils are equal, round,  and reactive to light.  Neck:     Thyroid: No thyroid mass.     Trachea: No tracheal deviation.  Cardiovascular:     Rate and Rhythm: Normal rate and regular rhythm.     Heart sounds: Normal heart sounds. No murmur heard.  No gallop.   Pulmonary:     Effort: Pulmonary effort is normal. No respiratory distress.     Breath sounds: Normal breath sounds. No stridor. No decreased breath sounds, wheezing, rhonchi or rales.  Abdominal:     General: Bowel sounds are normal. There is no distension.     Palpations: Abdomen is soft.     Tenderness: There is no abdominal tenderness. There is no rebound.  Musculoskeletal:        General: No tenderness. Normal range of motion.     Cervical back: Normal range of motion and neck supple.  Skin:    General: Skin is warm and dry.     Findings: No abrasion or rash.  Neurological:     Mental Status: She is alert and oriented to person, place, and time.     GCS: GCS eye subscore is 4. GCS verbal subscore is 5. GCS motor subscore is 6.     Cranial Nerves: No cranial nerve deficit.     Sensory: No sensory deficit.  Psychiatric:         Speech: Speech normal.        Behavior: Behavior normal.     ED Results / Procedures / Treatments   Labs (all labs ordered are listed, but only abnormal results are displayed) Labs Reviewed  CBC WITH DIFFERENTIAL/PLATELET - Abnormal; Notable for the following components:      Result Value   WBC 18.6 (*)    RBC 2.89 (*)    Hemoglobin 8.7 (*)    HCT 26.0 (*)    RDW 19.2 (*)    Platelets 595 (*)    nRBC 0.5 (*)    Neutro Abs 13.2 (*)    Monocytes Absolute 2.7 (*)    Abs Immature Granulocytes 0.13 (*)    All other components within normal limits  RESPIRATORY PANEL BY RT PCR (FLU A&B, COVID)  COMPREHENSIVE METABOLIC PANEL  RETICULOCYTES  I-STAT BETA HCG BLOOD, ED (MC, WL, AP ONLY)  I-STAT BETA HCG BLOOD, ED (MC, WL, AP ONLY)  TROPONIN I (HIGH SENSITIVITY)    EKG EKG Interpretation  Date/Time:  Monday April 24 2020 18:51:54 EST Ventricular Rate:  105 PR Interval:    QRS Duration: 81 QT Interval:  327 QTC Calculation: 433 R Axis:   53 Text Interpretation: Sinus tachycardia Nonspecific T abnormalities, diffuse leads 12 Lead; [place] No significant change since last tracing Confirmed by Lacretia Leigh ([id number]) on 04/24/2020 10:40:22 PM   Radiology No results found.  Procedures Procedures (including critical care time)  Medications Ordered in ED Medications  0.45 % sodium chloride infusion (has no administration in time range)  HYDROmorphone (DILAUDID) injection 2 mg (has no administration in time range)  HYDROmorphone (DILAUDID) injection 2 mg (has no administration in time range)  diphenhydrAMINE (BENADRYL) injection 25 mg (has no administration in time range)  ondansetron (ZOFRAN) injection 4 mg (has no administration in time range)    ED Course  I have reviewed the triage vital signs and the nursing notes.  Pertinent labs & imaging results that were available during my care of the patient were reviewed by me and considered in my medical decision making (see  chart for details).  MDM Rules/Calculators/A&P                         Patient hemoglobin is around her baseline.  Chest x-ray without acute findings. Patient given doses of hydromorphone here continues to note discomfort.  Will admit for pain management Final Clinical Impression(s) / ED Diagnoses Final diagnoses:  None    Rx / DC Orders ED Discharge Orders    None       Lacretia Leigh, MD 04/24/20 2210    Lacretia Leigh, MD 04/24/20 2240

## 2020-04-25 ENCOUNTER — Encounter (HOSPITAL_COMMUNITY): Payer: Self-pay | Admitting: Internal Medicine

## 2020-04-25 DIAGNOSIS — Z86711 Personal history of pulmonary embolism: Secondary | ICD-10-CM | POA: Diagnosis not present

## 2020-04-25 DIAGNOSIS — Z7951 Long term (current) use of inhaled steroids: Secondary | ICD-10-CM | POA: Diagnosis not present

## 2020-04-25 DIAGNOSIS — Z79899 Other long term (current) drug therapy: Secondary | ICD-10-CM | POA: Diagnosis not present

## 2020-04-25 DIAGNOSIS — F1120 Opioid dependence, uncomplicated: Secondary | ICD-10-CM | POA: Diagnosis present

## 2020-04-25 DIAGNOSIS — Z9851 Tubal ligation status: Secondary | ICD-10-CM | POA: Diagnosis not present

## 2020-04-25 DIAGNOSIS — R0789 Other chest pain: Secondary | ICD-10-CM | POA: Diagnosis present

## 2020-04-25 DIAGNOSIS — Z888 Allergy status to other drugs, medicaments and biological substances status: Secondary | ICD-10-CM | POA: Diagnosis not present

## 2020-04-25 DIAGNOSIS — D5700 Hb-SS disease with crisis, unspecified: Secondary | ICD-10-CM | POA: Diagnosis present

## 2020-04-25 DIAGNOSIS — Z20822 Contact with and (suspected) exposure to covid-19: Secondary | ICD-10-CM | POA: Diagnosis present

## 2020-04-25 DIAGNOSIS — K219 Gastro-esophageal reflux disease without esophagitis: Secondary | ICD-10-CM | POA: Diagnosis present

## 2020-04-25 DIAGNOSIS — K0889 Other specified disorders of teeth and supporting structures: Secondary | ICD-10-CM | POA: Diagnosis present

## 2020-04-25 DIAGNOSIS — G894 Chronic pain syndrome: Secondary | ICD-10-CM | POA: Diagnosis present

## 2020-04-25 DIAGNOSIS — Z7901 Long term (current) use of anticoagulants: Secondary | ICD-10-CM | POA: Diagnosis not present

## 2020-04-25 DIAGNOSIS — Z86718 Personal history of other venous thrombosis and embolism: Secondary | ICD-10-CM | POA: Diagnosis not present

## 2020-04-25 DIAGNOSIS — J449 Chronic obstructive pulmonary disease, unspecified: Secondary | ICD-10-CM | POA: Diagnosis present

## 2020-04-25 DIAGNOSIS — Z9049 Acquired absence of other specified parts of digestive tract: Secondary | ICD-10-CM | POA: Diagnosis not present

## 2020-04-25 LAB — CBC WITH DIFFERENTIAL/PLATELET
Abs Immature Granulocytes: 0.15 10*3/uL — ABNORMAL HIGH (ref 0.00–0.07)
Basophils Absolute: 0.1 10*3/uL (ref 0.0–0.1)
Basophils Relative: 0 %
Eosinophils Absolute: 0.4 10*3/uL (ref 0.0–0.5)
Eosinophils Relative: 2 %
HCT: 22.9 % — ABNORMAL LOW (ref 36.0–46.0)
Hemoglobin: 7.7 g/dL — ABNORMAL LOW (ref 12.0–15.0)
Immature Granulocytes: 1 %
Lymphocytes Relative: 19 %
Lymphs Abs: 3.3 10*3/uL (ref 0.7–4.0)
MCH: 30.3 pg (ref 26.0–34.0)
MCHC: 33.6 g/dL (ref 30.0–36.0)
MCV: 90.2 fL (ref 80.0–100.0)
Monocytes Absolute: 2.4 10*3/uL — ABNORMAL HIGH (ref 0.1–1.0)
Monocytes Relative: 14 %
Neutro Abs: 11.3 10*3/uL — ABNORMAL HIGH (ref 1.7–7.7)
Neutrophils Relative %: 64 %
Platelets: 510 10*3/uL — ABNORMAL HIGH (ref 150–400)
RBC: 2.54 MIL/uL — ABNORMAL LOW (ref 3.87–5.11)
RDW: 19 % — ABNORMAL HIGH (ref 11.5–15.5)
WBC: 17.6 10*3/uL — ABNORMAL HIGH (ref 4.0–10.5)
nRBC: 0.6 % — ABNORMAL HIGH (ref 0.0–0.2)

## 2020-04-25 LAB — BILIRUBIN, FRACTIONATED(TOT/DIR/INDIR)
Bilirubin, Direct: 0.4 mg/dL — ABNORMAL HIGH (ref 0.0–0.2)
Indirect Bilirubin: 2.1 mg/dL — ABNORMAL HIGH (ref 0.3–0.9)
Total Bilirubin: 2.5 mg/dL — ABNORMAL HIGH (ref 0.3–1.2)

## 2020-04-25 LAB — BASIC METABOLIC PANEL
Anion gap: 6 (ref 5–15)
BUN: 7 mg/dL (ref 6–20)
CO2: 21 mmol/L — ABNORMAL LOW (ref 22–32)
Calcium: 8.2 mg/dL — ABNORMAL LOW (ref 8.9–10.3)
Chloride: 107 mmol/L (ref 98–111)
Creatinine, Ser: 0.48 mg/dL (ref 0.44–1.00)
GFR, Estimated: >60 mL/min (ref >60)
Glucose, Bld: 102 mg/dL — ABNORMAL HIGH (ref 70–99)
Potassium: 3.3 mmol/L — ABNORMAL LOW (ref 3.5–5.1)
Sodium: 134 mmol/L — ABNORMAL LOW (ref 135–145)

## 2020-04-25 LAB — TROPONIN I (HIGH SENSITIVITY): Troponin I (High Sensitivity): <2 ng/L (ref <18)

## 2020-04-25 MED ORDER — DIPHENHYDRAMINE HCL 25 MG PO CAPS
25.0000 mg | ORAL_CAPSULE | ORAL | Status: DC | PRN
  Administered 2020-04-25 – 2020-04-27 (×5): 25 mg via ORAL
  Filled 2020-04-25 (×5): qty 1

## 2020-04-25 MED ORDER — SODIUM CHLORIDE 0.9 % IV SOLN
1.5000 g | Freq: Four times a day (QID) | INTRAVENOUS | Status: AC
  Administered 2020-04-25 – 2020-04-26 (×8): 1.5 g via INTRAVENOUS
  Filled 2020-04-25: qty 1.5
  Filled 2020-04-25 (×2): qty 4
  Filled 2020-04-25 (×6): qty 1.5

## 2020-04-25 MED ORDER — NALOXONE HCL 0.4 MG/ML IJ SOLN: 0.4000 mg | INTRAMUSCULAR | Status: DC | PRN

## 2020-04-25 MED ORDER — DIPHENHYDRAMINE HCL 25 MG PO CAPS
25.0000 mg | ORAL_CAPSULE | Freq: Four times a day (QID) | ORAL | Status: DC | PRN
  Administered 2020-04-25: 25 mg via ORAL
  Filled 2020-04-25: qty 1

## 2020-04-25 MED ORDER — POTASSIUM CHLORIDE CRYS ER 20 MEQ PO TBCR
20.0000 meq | EXTENDED_RELEASE_TABLET | Freq: Every day | ORAL | Status: DC
  Administered 2020-04-25 – 2020-04-28 (×4): 20 meq via ORAL
  Filled 2020-04-25 (×4): qty 1

## 2020-04-25 MED ORDER — HYDROMORPHONE HCL 1 MG/ML IJ SOLN
1.0000 mg | INTRAMUSCULAR | Status: AC | PRN
  Administered 2020-04-25: 1 mg via INTRAVENOUS
  Filled 2020-04-25: qty 1

## 2020-04-25 MED ORDER — ONDANSETRON HCL 4 MG/2ML IJ SOLN: 4.0000 mg | Freq: Four times a day (QID) | INTRAMUSCULAR | Status: DC | PRN

## 2020-04-25 MED ORDER — HYDROMORPHONE 1 MG/ML IV SOLN
INTRAVENOUS | Status: DC
  Administered 2020-04-25: 2.5 mg via INTRAVENOUS
  Administered 2020-04-25: 4.5 mg via INTRAVENOUS
  Administered 2020-04-25: 1 mL via INTRAVENOUS
  Administered 2020-04-26: 1 mg via INTRAVENOUS
  Administered 2020-04-26: 2.5 mg via INTRAVENOUS
  Administered 2020-04-26: 1 mg via INTRAVENOUS
  Filled 2020-04-25: qty 30

## 2020-04-25 MED ORDER — SODIUM CHLORIDE 0.9% FLUSH: 9.0000 mL | INTRAVENOUS | Status: DC | PRN

## 2020-04-25 MED ORDER — KETOROLAC TROMETHAMINE 30 MG/ML IJ SOLN
15.0000 mg | Freq: Four times a day (QID) | INTRAMUSCULAR | Status: DC
  Administered 2020-04-25 – 2020-04-28 (×13): 15 mg via INTRAVENOUS
  Filled 2020-04-25 (×13): qty 1

## 2020-04-25 MED ORDER — HYDROMORPHONE HCL 1 MG/ML IJ SOLN
1.0000 mg | INTRAMUSCULAR | Status: DC | PRN
  Administered 2020-04-25 (×3): 1 mg via INTRAVENOUS
  Filled 2020-04-25 (×3): qty 1

## 2020-04-25 MED ORDER — CHLORHEXIDINE GLUCONATE CLOTH 2 % EX PADS
6.0000 | MEDICATED_PAD | Freq: Every day | CUTANEOUS | Status: DC
  Administered 2020-04-25 – 2020-04-27 (×3): 6 via TOPICAL

## 2020-04-25 NOTE — ED Notes (Signed)
Breakfast tray delivered

## 2020-04-25 NOTE — Progress Notes (Signed)
Subjective: Molly Gutierrez is a 39 year old female with a medical history significant for sickle cell disease, chronic pain syndrome, history of PE on xarelto, opiate dependence and tolerance was admitted for sickle cell pain crisis.   Patient says that pain is 8/10 primarily to low back and central chest..  Pain is characterized as constant and throbbing.  Patient denies any headache, shortness of breath, urinary symptoms, nausea, vomiting, or diarrhea.  Objective:  Vital signs in last 24 hours:  Vitals:   04/25/20 1145 04/25/20 1154 04/25/20 1215 04/25/20 1309  BP: 98/71 105/72 104/74 117/80  Pulse: 76 78 75 75  Resp: 12 15 13 17   Temp:  99.1 F (37.3 C)  99.3 F (37.4 C)  TempSrc:  Oral  Oral  SpO2: 98% 97% 97% 98%  Weight:    57.8 kg  Height:    5\' 3"  (1.6 m)    Intake/Output from previous day:   Intake/Output Summary (Last 24 hours) at 04/25/2020 1519 Last data filed at 04/25/2020 0636 Gross per 24 hour  Intake 1000 ml  Output --  Net 1000 ml    Physical Exam: General: Alert, awake, oriented x3, in no acute distress.  HEENT: [place]/AT PEERL, EOMI Neck: Trachea midline,  no masses, no thyromegal,y no JVD, no carotid bruit OROPHARYNX:  Moist, No exudate/ erythema/lesions.  Heart: Regular rate and rhythm, without murmurs, rubs, gallops, PMI non-displaced, no heaves or thrills on palpation.  Lungs: Clear to auscultation, no wheezing or rhonchi noted. No increased vocal fremitus resonant to percussion  Abdomen: Soft, nontender, nondistended, positive bowel sounds, no masses no hepatosplenomegaly noted..  Neuro: No focal neurological deficits noted cranial nerves II through XII grossly intact. DTRs 2+ bilaterally upper and lower extremities. Strength 5 out of 5 in bilateral upper and lower extremities. Musculoskeletal: No warm swelling or erythema around joints, no spinal tenderness noted. Psychiatric: Patient alert and oriented x3, good insight and cognition, good recent  to remote recall. Lymph node survey: No cervical axillary or inguinal lymphadenopathy noted.  Lab Results:  Basic Metabolic Panel:    Component Value Date/Time   NA 134 (L) 04/25/2020 0429   NA 137 04/13/2020 1519   K 3.3 (L) 04/25/2020 0429   CL 107 04/25/2020 0429   CO2 21 (L) 04/25/2020 0429   BUN 7 04/25/2020 0429   BUN 8 04/13/2020 1519   CREATININE 0.48 04/25/2020 0429   GLUCOSE 102 (H) 04/25/2020 0429   CALCIUM 8.2 (L) 04/25/2020 0429   CBC:    Component Value Date/Time   WBC 17.6 (H) 04/25/2020 0429   HGB 7.7 (L) 04/25/2020 0429   HGB 8.6 (L) 04/13/2020 1519   HCT 22.9 (L) 04/25/2020 0429   HCT 25.5 (L) 04/13/2020 1519   PLT 510 (H) 04/25/2020 0429   PLT 648 (H) 04/13/2020 1519   MCV 90.2 04/25/2020 0429   MCV 88 04/13/2020 1519   NEUTROABS 11.3 (H) 04/25/2020 0429   NEUTROABS 11.0 (H) 04/13/2020 1519   LYMPHSABS 3.3 04/25/2020 0429   LYMPHSABS 3.0 04/13/2020 1519   MONOABS 2.4 (H) 04/25/2020 0429   EOSABS 0.4 04/25/2020 0429   EOSABS 0.1 04/13/2020 1519   BASOSABS 0.1 04/25/2020 0429   BASOSABS 0.1 04/13/2020 1519    Recent Results (from the past 240 hour(s))  Respiratory Panel by RT PCR (Flu A&B, Covid) - Nasopharyngeal Swab     Status: None   Collection Time: 04/24/20  8:07 PM   Specimen: Nasopharyngeal Swab  Result Value Ref Range Status  SARS Coronavirus 2 by RT PCR NEGATIVE NEGATIVE Final    Comment: (NOTE) SARS-CoV-2 target nucleic acids are NOT DETECTED.  The SARS-CoV-2 RNA is generally detectable in upper respiratoy specimens during the acute phase of infection. The lowest concentration of SARS-CoV-2 viral copies this assay can detect is 131 copies/mL. A negative result does not preclude SARS-Cov-2 infection and should not be used as the sole basis for treatment or other patient management decisions. A negative result may occur with  improper specimen collection/handling, submission of specimen other than nasopharyngeal swab, presence of  viral mutation(s) within the areas targeted by this assay, and inadequate number of viral copies (<131 copies/mL). A negative result must be combined with clinical observations, patient history, and epidemiological information. The expected result is Negative.  Fact Sheet for Patients:  PinkCheek.be  Fact Sheet for Healthcare Providers:  GravelBags.it  This test is no t yet approved or cleared by the [place] FDA and  has been authorized for detection and/or diagnosis of SARS-CoV-2 by FDA under an Emergency Use Authorization (EUA). This EUA will remain  in effect (meaning this test can be used) for the duration of the COVID-19 declaration under Section 564(b)(1) of the Act, 21 U.S.C. section 360bbb-3(b)(1), unless the authorization is terminated or revoked sooner.     Influenza A by PCR NEGATIVE NEGATIVE Final   Influenza B by PCR NEGATIVE NEGATIVE Final    Comment: (NOTE) The Xpert Xpress SARS-CoV-2/FLU/RSV assay is intended as an aid in  the diagnosis of influenza from Nasopharyngeal swab specimens and  should not be used as a sole basis for treatment. Nasal washings and  aspirates are unacceptable for Xpert Xpress SARS-CoV-2/FLU/RSV  testing.  Fact Sheet for Patients: PinkCheek.be  Fact Sheet for Healthcare Providers: GravelBags.it  This test is not yet approved or cleared by the [place] FDA and  has been authorized for detection and/or diagnosis of SARS-CoV-2 by  FDA under an Emergency Use Authorization (EUA). This EUA will remain  in effect (meaning this test can be used) for the duration of the  Covid-19 declaration under Section 564(b)(1) of the Act, 21  U.S.C. section 360bbb-3(b)(1), unless the authorization is  terminated or revoked. Performed at [facility] [address], [place], [place] [address]      Studies/Results: DG Chest Port 1 View  Result Date: 04/24/2020 CLINICAL DATA:  Chest pain EXAM: PORTABLE CHEST 1 VIEW COMPARISON:  02/19/2020 FINDINGS: Bilateral central venous catheters are again noted, stable from prior study. There is no pneumothorax. No large pleural effusion. The heart size is stable. There is no acute osseous abnormality. IMPRESSION: No active disease. Electronically Signed   By: Constance Holster M.D.   On: 04/24/2020 20:37    Medications: Scheduled Meds:  celecoxib  200 mg Oral Daily   Chlorhexidine Gluconate Cloth  6 each Topical Daily   cholecalciferol  1,000 Units Oral Daily   Deferasirox  1,080 mg Oral QAC breakfast   folic acid  1 mg Oral Daily   gabapentin  300 mg Oral TID   HYDROmorphone   Intravenous Q4H   mometasone-formoterol  2 puff Inhalation BID   pantoprazole  40 mg Oral Daily   rivaroxaban  20 mg Oral Q supper   vitamin B-12  1,000 mcg Oral Daily   Continuous Infusions:  sodium chloride 150 mL/hr at 04/25/20 1125   ampicillin-sulbactam (UNASYN) IV 1.5 g (04/25/20 1337)   PRN Meds:.diphenhydrAMINE, diphenhydrAMINE, naloxone **AND** sodium chloride flush, ondansetron, ondansetron (ZOFRAN) IV  Consultants:  None  Procedures:  None  Antibiotics:  IV Unasyn  Assessment/Plan: Active Problems:   Sickle cell anemia with pain (HCC)  Sickle cell disease with pain crisis: Continue IV fluids, 0.45% saline at [facility]. Initiate IV Dilaudid PCA with settings of 0.5 mg, 10-minute lockout, and 2 mg/h Toradol 15 mg IV every 6 hours for total of 5 days Monitor vital signs closely, reevaluate pain scale regularly, and supplemental oxygen as needed  Chronic pain syndrome: Hold home Dilaudid, use PCA substitute  Sickle cell anemia: Hemoglobin 7.7, consistent with patient's baseline.  There is no clinical indication for blood transfusion at this time.  Continue to follow closely.  CBC in a.m.  Leukocytosis: WBC 17.6.  Patient is  afebrile without any signs of infection or inflammation.  COVID-19 test negative.  Chest x-ray shows no acute cardiopulmonary process.  Will review urinalysis and urine culture as results become available.  Repeat CBC in a.m.  History of PE: Continue Xarelto  History of mild intermittent asthma: Stable.  Continue home medications as needed.  Code Status: Full Code Family Communication: N/A Disposition Plan: Not yet ready for discharge  [place], MSN, FNP-C Patient [place] [address] [place], [place] [address] [phone]  If 5PM-8AM, please contact night-coverage.  04/25/2020, 3:19 PM  LOS: 0 days

## 2020-04-25 NOTE — Progress Notes (Signed)
Report received from Crystal RN

## 2020-04-25 NOTE — ED Notes (Signed)
Pt requesting pain medication. MD made aware. 

## 2020-04-25 NOTE — ED Notes (Signed)
Patient ambulated to the bathroom.

## 2020-04-26 DIAGNOSIS — D5700 Hb-SS disease with crisis, unspecified: Secondary | ICD-10-CM | POA: Diagnosis not present

## 2020-04-26 MED ORDER — OXYCODONE HCL 5 MG PO TABS
10.0000 mg | ORAL_TABLET | ORAL | Status: DC | PRN
  Administered 2020-04-26 – 2020-04-27 (×4): 10 mg via ORAL
  Filled 2020-04-26 (×4): qty 2

## 2020-04-26 MED ORDER — HYDROMORPHONE 1 MG/ML IV SOLN
INTRAVENOUS | Status: DC
  Administered 2020-04-26: 3 mg via INTRAVENOUS
  Administered 2020-04-27: 2.5 mg via INTRAVENOUS
  Administered 2020-04-27: 4 mg via INTRAVENOUS
  Administered 2020-04-27: 3 mg via INTRAVENOUS
  Filled 2020-04-26: qty 30

## 2020-04-26 NOTE — Progress Notes (Signed)
Subjective: Molly Gutierrez is a 39 year old female with a medical history significant for sickle cell disease, chronic pain syndrome, history of PE on Xarelto, opiate dependence and tolerance, and history of mild intermittent asthma was admitted for sickle cell pain crisis.  Patient states that pain has improved some overnight.  Pain is primarily to low back and is characterized as constant and aching.  Patient denies any headache, shortness of breath, urinary symptoms, nausea, vomiting, or diarrhea.  Objective:  Vital signs in last 24 hours:  Vitals:   04/26/20 0613 04/26/20 0633 04/26/20 0811 04/26/20 1100  BP: 112/70   96/70  Pulse: 89   69  Resp: 14 14  20   Temp: 99.2 F (37.3 C)   98.7 F (37.1 C)  TempSrc: Oral   Oral  SpO2: 94% 98% 92% 95%  Weight: 61.8 kg     Height:        Intake/Output from previous day:  No intake or output data in the 24 hours ending 04/26/20 1252  Physical Exam: General: Alert, awake, oriented x3, in no acute distress.  HEENT: [facility]/AT PEERL, EOMI Neck: Trachea midline,  no masses, no thyromegal,y no JVD, no carotid bruit OROPHARYNX:  Moist, No exudate/ erythema/lesions.  Heart: Regular rate and rhythm, without murmurs, rubs, gallops, PMI non-displaced, no heaves or thrills on palpation.  Lungs: Clear to auscultation, no wheezing or rhonchi noted. No increased vocal fremitus resonant to percussion  Abdomen: Soft, nontender, nondistended, positive bowel sounds, no masses no hepatosplenomegaly noted..  Neuro: No focal neurological deficits noted cranial nerves II through XII grossly intact. DTRs 2+ bilaterally upper and lower extremities. Strength 5 out of 5 in bilateral upper and lower extremities. Musculoskeletal: No warm swelling or erythema around joints, no spinal tenderness noted. Psychiatric: Patient alert and oriented x3, good insight and cognition, good recent to remote recall. Lymph node survey: No cervical axillary or inguinal  lymphadenopathy noted.  Lab Results:  Basic Metabolic Panel:    Component Value Date/Time   NA 134 (L) 04/25/2020 0429   NA 137 04/13/2020 1519   K 3.3 (L) 04/25/2020 0429   CL 107 04/25/2020 0429   CO2 21 (L) 04/25/2020 0429   BUN 7 04/25/2020 0429   BUN 8 04/13/2020 1519   CREATININE 0.48 04/25/2020 0429   GLUCOSE 102 (H) 04/25/2020 0429   CALCIUM 8.2 (L) 04/25/2020 0429   CBC:    Component Value Date/Time   WBC 17.6 (H) 04/25/2020 0429   HGB 7.7 (L) 04/25/2020 0429   HGB 8.6 (L) 04/13/2020 1519   HCT 22.9 (L) 04/25/2020 0429   HCT 25.5 (L) 04/13/2020 1519   PLT 510 (H) 04/25/2020 0429   PLT 648 (H) 04/13/2020 1519   MCV 90.2 04/25/2020 0429   MCV 88 04/13/2020 1519   NEUTROABS 11.3 (H) 04/25/2020 0429   NEUTROABS 11.0 (H) 04/13/2020 1519   LYMPHSABS 3.3 04/25/2020 0429   LYMPHSABS 3.0 04/13/2020 1519   MONOABS 2.4 (H) 04/25/2020 0429   EOSABS 0.4 04/25/2020 0429   EOSABS 0.1 04/13/2020 1519   BASOSABS 0.1 04/25/2020 0429   BASOSABS 0.1 04/13/2020 1519    Recent Results (from the past 240 hour(s))  Respiratory Panel by RT PCR (Flu A&B, Covid) - Nasopharyngeal Swab     Status: None   Collection Time: 04/24/20  8:07 PM   Specimen: Nasopharyngeal Swab  Result Value Ref Range Status   SARS Coronavirus 2 by RT PCR NEGATIVE NEGATIVE Final    Comment: (NOTE) SARS-CoV-2 target  nucleic acids are NOT DETECTED.  The SARS-CoV-2 RNA is generally detectable in upper respiratoy specimens during the acute phase of infection. The lowest concentration of SARS-CoV-2 viral copies this assay can detect is 131 copies/mL. A negative result does not preclude SARS-Cov-2 infection and should not be used as the sole basis for treatment or other patient management decisions. A negative result may occur with  improper specimen collection/handling, submission of specimen other than nasopharyngeal swab, presence of viral mutation(s) within the areas targeted by this assay, and inadequate  number of viral copies (<131 copies/mL). A negative result must be combined with clinical observations, patient history, and epidemiological information. The expected result is Negative.  Fact Sheet for Patients:  PinkCheek.be  Fact Sheet for Healthcare Providers:  GravelBags.it  This test is no t yet approved or cleared by the [place] FDA and  has been authorized for detection and/or diagnosis of SARS-CoV-2 by FDA under an Emergency Use Authorization (EUA). This EUA will remain  in effect (meaning this test can be used) for the duration of the COVID-19 declaration under Section 564(b)(1) of the Act, 21 U.S.C. section 360bbb-3(b)(1), unless the authorization is terminated or revoked sooner.     Influenza A by PCR NEGATIVE NEGATIVE Final   Influenza B by PCR NEGATIVE NEGATIVE Final    Comment: (NOTE) The Xpert Xpress SARS-CoV-2/FLU/RSV assay is intended as an aid in  the diagnosis of influenza from Nasopharyngeal swab specimens and  should not be used as a sole basis for treatment. Nasal washings and  aspirates are unacceptable for Xpert Xpress SARS-CoV-2/FLU/RSV  testing.  Fact Sheet for Patients: PinkCheek.be  Fact Sheet for Healthcare Providers: GravelBags.it  This test is not yet approved or cleared by the [place] FDA and  has been authorized for detection and/or diagnosis of SARS-CoV-2 by  FDA under an Emergency Use Authorization (EUA). This EUA will remain  in effect (meaning this test can be used) for the duration of the  Covid-19 declaration under Section 564(b)(1) of the Act, 21  U.S.C. section 360bbb-3(b)(1), unless the authorization is  terminated or revoked. Performed at [facility], [place] [address] [place], [place] [address]     Studies/Results: DG Chest Port 1 View  Result Date: 04/24/2020 CLINICAL DATA:  Chest  pain EXAM: PORTABLE CHEST 1 VIEW COMPARISON:  02/19/2020 FINDINGS: Bilateral central venous catheters are again noted, stable from prior study. There is no pneumothorax. No large pleural effusion. The heart size is stable. There is no acute osseous abnormality. IMPRESSION: No active disease. Electronically Signed   By: Constance Holster M.D.   On: 04/24/2020 20:37    Medications: Scheduled Meds: . Chlorhexidine Gluconate Cloth  6 each Topical Daily  . cholecalciferol  1,000 Units Oral Daily  . Deferasirox  1,080 mg Oral QAC breakfast  . folic acid  1 mg Oral Daily  . gabapentin  300 mg Oral TID  . HYDROmorphone   Intravenous Q4H  . ketorolac  15 mg Intravenous Q6H  . mometasone-formoterol  2 puff Inhalation BID  . pantoprazole  40 mg Oral Daily  . potassium chloride SA  20 mEq Oral Daily  . rivaroxaban  20 mg Oral Q supper  . vitamin B-12  1,000 mcg Oral Daily   Continuous Infusions: . sodium chloride 20 mL/hr at 04/25/20 1543  . ampicillin-sulbactam (UNASYN) IV 1.5 g (04/26/20 1210)   PRN Meds:.diphenhydrAMINE, naloxone **AND** sodium chloride flush, ondansetron (ZOFRAN) IV  Consultants:  None  Procedures:  None  Antibiotics:  None  Assessment/Plan: Principal Problem:   Sickle cell anemia with pain (HCC) Active Problems:   Sickle cell pain crisis (HCC)   Leukocytosis   History of pulmonary embolism   Opioid dependence ([place])   Sickle disease with pain crisis:  Continue IV fluids to KVO. Weaning IV Dilaudid PCA, settings decreased Continue Toradol 15 mg IV every 6 hours Oxycodone 10 mg every 4 hours as needed for severe breakthrough pain Monitor vital signs closely, reevaluate pain scale regularly, and supplemental oxygen as needed.  Chronic pain syndrome: Hold p.o. Dilaudid, use PCA  Sickle cell anemia: Hemoglobin is stable and consistent with patient's baseline.  There is no clinical indication for blood transfusion at this time.  Continue to follow closely.   Repeat CBC in a.m.  Leukocytosis: WBCs trending down.  More than likely secondary to vaso-occlusive pain crisis patient is afebrile without any signs of infection or inflammation.  Continue to monitor closely without antibiotics.  Repeat CBC in a.m.  History of PE: Continue Xarelto  History of mild intermittent asthma: Stable.  Continue home medications as needed.  Code Status: Full Code Family Communication: N/A Disposition Plan: Not yet ready for discharge  [place], MSN, FNP-C Patient [place], [place] [address] [phone]  If 5 PM- 8 AM, please contact night-coverage.  04/26/2020, 12:52 PM  LOS: 1 day

## 2020-04-27 DIAGNOSIS — D5700 Hb-SS disease with crisis, unspecified: Secondary | ICD-10-CM | POA: Diagnosis not present

## 2020-04-27 MED ORDER — HYDROMORPHONE HCL 1 MG/ML IJ SOLN
1.0000 mg | Freq: Four times a day (QID) | INTRAMUSCULAR | Status: DC | PRN
  Administered 2020-04-27 – 2020-04-28 (×3): 1 mg via INTRAVENOUS
  Filled 2020-04-27 (×3): qty 1

## 2020-04-27 MED ORDER — HYDROMORPHONE HCL 4 MG PO TABS
4.0000 mg | ORAL_TABLET | ORAL | Status: DC | PRN
  Administered 2020-04-27 (×2): 4 mg via ORAL
  Filled 2020-04-27 (×2): qty 1

## 2020-04-27 NOTE — Progress Notes (Signed)
Subjective: Patient has no new complaints on today.  Pain continues to be primarily to central chest and low back.  Pain intensity is 6/10, which has improved overnight.  Patient has not mobilized.  She denies any headache, shortness of breath, urinary symptoms, nausea, vomiting, or diarrhea.  Objective:  Vital signs in last 24 hours:  Vitals:   04/27/20 0931 04/27/20 1015 04/27/20 1146 04/27/20 1342  BP:  113/75  124/89  Pulse:  83  91  Resp:  14 14 16   Temp:  99.2 F (37.3 C)  98.7 F (37.1 C)  TempSrc:  Oral  Oral  SpO2: 97% 98% 94% 93%  Weight:      Height:        Intake/Output from previous day:  No intake or output data in the 24 hours ending 04/27/20 1717  Physical Exam: General: Alert, awake, oriented x3, in no acute distress.  HEENT: [place]/AT PEERL, EOMI Neck: Trachea midline,  no masses, no thyromegal,y no JVD, no carotid bruit OROPHARYNX:  Moist, No exudate/ erythema/lesions.  Heart: Regular rate and rhythm, without murmurs, rubs, gallops, PMI non-displaced, no heaves or thrills on palpation.  Lungs: Clear to auscultation, no wheezing or rhonchi noted. No increased vocal fremitus resonant to percussion  Abdomen: Soft, nontender, nondistended, positive bowel sounds, no masses no hepatosplenomegaly noted..  Neuro: No focal neurological deficits noted cranial nerves II through XII grossly intact. DTRs 2+ bilaterally upper and lower extremities. Strength 5 out of 5 in bilateral upper and lower extremities. Musculoskeletal: No warm swelling or erythema around joints, no spinal tenderness noted. Psychiatric: Patient alert and oriented x3, good insight and cognition, good recent to remote recall. Lymph node survey: No cervical axillary or inguinal lymphadenopathy noted.  Lab Results:  Basic Metabolic Panel:    Component Value Date/Time   NA 134 (L) 04/25/2020 0429   NA 137 04/13/2020 1519   K 3.3 (L) 04/25/2020 0429   CL 107 04/25/2020 0429   CO2 21 (L) 04/25/2020 0429    BUN 7 04/25/2020 0429   BUN 8 04/13/2020 1519   CREATININE 0.48 04/25/2020 0429   GLUCOSE 102 (H) 04/25/2020 0429   CALCIUM 8.2 (L) 04/25/2020 0429   CBC:    Component Value Date/Time   WBC 17.6 (H) 04/25/2020 0429   HGB 7.7 (L) 04/25/2020 0429   HGB 8.6 (L) 04/13/2020 1519   HCT 22.9 (L) 04/25/2020 0429   HCT 25.5 (L) 04/13/2020 1519   PLT 510 (H) 04/25/2020 0429   PLT 648 (H) 04/13/2020 1519   MCV 90.2 04/25/2020 0429   MCV 88 04/13/2020 1519   NEUTROABS 11.3 (H) 04/25/2020 0429   NEUTROABS 11.0 (H) 04/13/2020 1519   LYMPHSABS 3.3 04/25/2020 0429   LYMPHSABS 3.0 04/13/2020 1519   MONOABS 2.4 (H) 04/25/2020 0429   EOSABS 0.4 04/25/2020 0429   EOSABS 0.1 04/13/2020 1519   BASOSABS 0.1 04/25/2020 0429   BASOSABS 0.1 04/13/2020 1519    Recent Results (from the past 240 hour(s))  Respiratory Panel by RT PCR (Flu A&B, Covid) - Nasopharyngeal Swab     Status: None   Collection Time: 04/24/20  8:07 PM   Specimen: Nasopharyngeal Swab  Result Value Ref Range Status   SARS Coronavirus 2 by RT PCR NEGATIVE NEGATIVE Final    Comment: (NOTE) SARS-CoV-2 target nucleic acids are NOT DETECTED.  The SARS-CoV-2 RNA is generally detectable in upper respiratoy specimens during the acute phase of infection. The lowest concentration of SARS-CoV-2 viral copies this assay can detect  is 131 copies/mL. A negative result does not preclude SARS-Cov-2 infection and should not be used as the sole basis for treatment or other patient management decisions. A negative result may occur with  improper specimen collection/handling, submission of specimen other than nasopharyngeal swab, presence of viral mutation(s) within the areas targeted by this assay, and inadequate number of viral copies (<131 copies/mL). A negative result must be combined with clinical observations, patient history, and epidemiological information. The expected result is Negative.  Fact Sheet for Patients:   PinkCheek.be  Fact Sheet for Healthcare Providers:  GravelBags.it  This test is no t yet approved or cleared by the [place] FDA and  has been authorized for detection and/or diagnosis of SARS-CoV-2 by FDA under an Emergency Use Authorization (EUA). This EUA will remain  in effect (meaning this test can be used) for the duration of the COVID-19 declaration under Section 564(b)(1) of the Act, 21 U.S.C. section 360bbb-3(b)(1), unless the authorization is terminated or revoked sooner.     Influenza A by PCR NEGATIVE NEGATIVE Final   Influenza B by PCR NEGATIVE NEGATIVE Final    Comment: (NOTE) The Xpert Xpress SARS-CoV-2/FLU/RSV assay is intended as an aid in  the diagnosis of influenza from Nasopharyngeal swab specimens and  should not be used as a sole basis for treatment. Nasal washings and  aspirates are unacceptable for Xpert Xpress SARS-CoV-2/FLU/RSV  testing.  Fact Sheet for Patients: PinkCheek.be  Fact Sheet for Healthcare Providers: GravelBags.it  This test is not yet approved or cleared by the [place] FDA and  has been authorized for detection and/or diagnosis of SARS-CoV-2 by  FDA under an Emergency Use Authorization (EUA). This EUA will remain  in effect (meaning this test can be used) for the duration of the  Covid-19 declaration under Section 564(b)(1) of the Act, 21  U.S.C. section 360bbb-3(b)(1), unless the authorization is  terminated or revoked. Performed at [facility], [place] [address], [place],  [address]     Studies/Results: No results found.  Medications: Scheduled Meds: . Chlorhexidine Gluconate Cloth  6 each Topical Daily  . cholecalciferol  1,000 Units Oral Daily  . Deferasirox  1,080 mg Oral QAC breakfast  . folic acid  1 mg Oral Daily  . gabapentin  300 mg Oral TID  . ketorolac  15 mg  Intravenous Q6H  . mometasone-formoterol  2 puff Inhalation BID  . pantoprazole  40 mg Oral Daily  . potassium chloride SA  20 mEq Oral Daily  . rivaroxaban  20 mg Oral Q supper  . vitamin B-12  1,000 mcg Oral Daily   Continuous Infusions: . sodium chloride 20 mL/hr at 04/26/20 2025   PRN Meds:.HYDROmorphone (DILAUDID) injection, HYDROmorphone  Consultants:  None  Procedures:  None   Antibiotics:  None  Assessment/Plan: Principal Problem:   Sickle cell anemia with pain (HCC) Active Problems:   Sickle cell pain crisis (HCC)   Leukocytosis   History of pulmonary embolism   Opioid dependence ([place])  sickle cell disease with pain crisis: Continue IV fluids at [facility].  Discontinue IV Dilaudid PCA Dilaudid 4 mg by mouth every 4 hours as needed Dilaudid 1 mg every 6 hours as needed for severe breakthrough pain continue Toradol 15 mg IV every 6 hours monitor vital signs closely, reevaluate pain scale regularly, and supplemental oxygen as needed.  Chronic pain syndrome: Continue home medications  sickle cell anemia: Hemoglobin stable and consistent with patient's baseline.  There is no clinical indication for  blood transfusion at this time.  Continue to follow closely.  Repeat CBC in a.m.  Leukocytosis: Stable.  More than likely secondary to vaso-occlusive pain crisis.  Patient is afebrile without any signs of infection or inflammation.  Continue to monitor closely without antibiotics.  History of PE: Continue Xarelto  history of mild intermittent asthma: Stable.  Continue home medications as needed.  Code Status: Full Code Family Communication: N/A Disposition Plan: Not yet ready for discharge  [place], MSN, FNP-C Patient [place] [address] [place], [place] [address] [phone]  If 5PM-8AM, please contact night-coverage.  04/27/2020, 5:17 PM  LOS: 2 days

## 2020-04-28 DIAGNOSIS — D5700 Hb-SS disease with crisis, unspecified: Secondary | ICD-10-CM | POA: Diagnosis not present

## 2020-04-28 MED ORDER — HYDROMORPHONE HCL 2 MG/ML IJ SOLN
2.0000 mg | Freq: Once | INTRAMUSCULAR | Status: AC
  Administered 2020-04-28: 2 mg via INTRAVENOUS
  Filled 2020-04-28: qty 1

## 2020-04-28 MED ORDER — HEPARIN SOD (PORK) LOCK FLUSH 100 UNIT/ML IV SOLN
500.0000 [IU] | Freq: Once | INTRAVENOUS | Status: AC
  Administered 2020-04-28: 500 [IU] via INTRAVENOUS
  Filled 2020-04-28: qty 5

## 2020-04-28 NOTE — Progress Notes (Signed)
Patient provided AVS information, PORT heparin locked and de-accessed. Pt waiting for brother to pick her up.

## 2020-04-28 NOTE — Discharge Summary (Signed)
Physician Discharge Summary  [facility] XVQ:[id number] DOB: [birth year] DOA: 04/24/2020  PCP: Vevelyn Francois, NP  Admit date: 04/24/2020  Discharge date: 04/28/2020  Discharge Diagnoses:  Principal Problem:   Sickle cell anemia with pain ([place]) Active Problems:   Sickle cell pain crisis ([place])   Leukocytosis   History of pulmonary embolism   Opioid dependence ([place])   Discharge Condition: Stable  Disposition:  Pt is discharged home in good condition and is to follow up with Vevelyn Francois, NP this week to have labs evaluated. Molly Gutierrez is instructed to increase activity slowly and balance with rest for the next few days, and use prescribed medication to complete treatment of pain  Diet: Regular Wt Readings from Last 3 Encounters:  04/27/20 65.9 kg  04/13/20 55.6 kg  04/03/20 63.1 kg    History of present illness:  Molly Gutierrez is a 39 year old female with a medical history significant for sickle cell disease, chronic pain syndrome, opiate dependence and tolerance, history of pulmonary embolism on Xarelto, mild intermittent asthma, and history of anemia of chronic disease presented to [facility] ER with complaints of chest pain, centrally located, aching and throbbing 8/10, constant, and nonradiating with an onset 2 days ago.  Patient was seen at sickle cell clinic 2 days prior to her presentation for the same complaint where she was given IV fluid and IV pain medication.  She states that she has been having right upper toothache pain additionally for which she has a dentist appointment scheduled on 05/15/2020 at 1:30 PM for extraction.  She was started on amoxicillin on 04/17/2020 for 10 days.  Patient reports a temperature of 99.8 at home.  She denies any loss of appetite or change in oral intake.  First set of troponin in results in the ER were negative.  Twelve-lead EKG shows no evidence of acute ischemia.  EDP requested admission for chest pain from sickle cell  disease.  ER course: Vital signs essentially unremarkable except temperature of 99.2.  Lab studies remarkable for WBC of 18.6 K.  Hospital Course:  Sickle cell disease with pain crisis: Patient was admitted for sickle cell pain crisis and managed appropriately with IVF, IV Dilaudid via PCA and IV Toradol, as well as other adjunct therapies per sickle cell pain management protocols.  IV Dilaudid PCA was weaned appropriately.  Patient was transitioned to home medications.  Pain intensity is 6/10.  Patient will be discharged home today. Advised to follow-up with PCP for medication management and to repeat CBC with differential and BMP.  Patient is not currently on any disease modifying agents and has been lost to follow-up with hematology. During admission, hemoglobin remained consistent with patient's baseline between 7-8 g/dL.  No transfusion warranted. WBCs appear to be chronically elevated.  Patient is afebrile without any signs of infection or inflammation.  History of pulmonary embolism: Patient has a history of pulmonary embolism, Xarelto was continued throughout admission without interruption.  Patient advised to resume medication.  Patient states that she is no longer having to take and is scheduled with dentist on 05/15/2020.  No further antibiotics warranted as an outpatient.  Patient was therefore discharged home today in a hemodynamically stable condition.   Zamyra will follow-up with PCP within 1 week of this discharge. Molly Gutierrez was counseled extensively about nonpharmacologic means of pain management, patient verbalized understanding and was appreciative of  the care received during this admission.   We discussed the need for good hydration, monitoring of hydration status, avoidance  of heat, cold, stress, and infection triggers.  Patient advised to continue folic acid 1 mg daily.  Patient was reminded of the need to seek medical attention immediately if any symptom of bleeding,  anemia, or infection occurs.  Discharge Exam: Vitals:   04/28/20 1003 04/28/20 1312  BP: 102/69 107/73  Pulse: 76 93  Resp: 15 15  Temp: 99.1 F (37.3 C) 99.4 F (37.4 C)  SpO2: 98% 98%   Vitals:   04/27/20 2358 04/28/20 0402 04/28/20 1003 04/28/20 1312  BP: 127/78 100/62 102/69 107/73  Pulse: 99 86 76 93  Resp: 16 14 15 15   Temp: 99.7 F (37.6 C) 98.2 F (36.8 C) 99.1 F (37.3 C) 99.4 F (37.4 C)  TempSrc: Oral Oral Oral Oral  SpO2: 97% 93% 98% 98%  Weight:      Height:        General appearance : Awake, alert, not in any distress. Speech Clear. Not toxic looking HEENT: Atraumatic and Normocephalic, pupils equally reactive to light and accomodation Neck: Supple, no JVD. No cervical lymphadenopathy.  Chest: Good air entry bilaterally, no added sounds  CVS: S1 S2 regular, no murmurs.  Abdomen: Bowel sounds present, Non tender and not distended with no gaurding, rigidity or rebound. Extremities: B/L Lower Ext shows no edema, both legs are warm to touch Neurology: Awake alert, and oriented X 3, CN II-XII intact, Non focal Skin: No Rash  Discharge Instructions  Discharge Instructions    Discharge patient   Complete by: As directed    Discharge disposition: 01-Home or Self Care   Discharge patient date: 04/28/2020     Allergies as of 04/28/2020      Reactions   Cefaclor Hives, Swelling   Hydroxyurea Palpitations, Other (See Comments)   Lowers "blood levels" and heart rate (causes HYPOtension); "it messes me up, it drops my levels and stuff" Other reaction(s): Hypotension, Other (See Comments) "it messes me up, it drops my levels and stuff" Lower blood levels and HR Other reaction(s): Other (See Comments) Lowers "blood levels" and heart rate (causes HYPOtension); "it messes me up, it drops my levels and stuff"   Ketamine Palpitations, Other (See Comments)   "Pt states she has had previous reaction to ketamine. States she becomes flushed, heart races, dizzy, and  feels like she is going to pass out." Other reaction(s): Other (See Comments), Other (See Comments) "Pt states she has had previous reaction to ketamine. States she becomes flushed, heart races, dizzy, and feels like she is going to pass out." "Pt states she has had previous reaction to ketamine. States she becomes flushed, heart races, dizzy, and feels like she is going to pass out."      Medication List    TAKE these medications   albuterol 108 (90 Base) MCG/ACT inhaler Commonly known as: VENTOLIN HFA Inhale 2 puffs into the lungs 2 (two) times daily as needed for wheezing or shortness of breath.   amoxicillin 875 MG tablet Commonly known as: AMOXIL Take 875 mg by mouth 2 (two) times daily.   budesonide-formoterol 80-4.5 MCG/ACT inhaler Commonly known as: SYMBICORT Inhale 2 puffs into the lungs 2 (two) times daily.   celecoxib 200 MG capsule Commonly known as: CELEBREX Take 1 capsule (200 mg total) by mouth daily.   Deferasirox 360 MG Tabs Commonly known as: Jadenu Take 3 tablets (1,080 mg total) by mouth daily before breakfast.   diphenhydrAMINE 25 mg capsule Commonly known as: BENADRYL Take 25 mg by mouth 3 (three) times daily  as needed for itching.   folic acid 1 MG tablet Commonly known as: FOLVITE Take 1 tablet (1 mg total) by mouth daily.   gabapentin 300 MG capsule Commonly known as: NEURONTIN Take 1 capsule (300 mg total) by mouth 3 (three) times daily.   HYDROmorphone 4 MG tablet Commonly known as: DILAUDID Take 1 tablet (4 mg total) by mouth every 6 (six) hours as needed for up to 15 days for moderate pain or severe pain.   mirtazapine 45 MG tablet Commonly known as: REMERON Take 1 tablet (45 mg total) by mouth at bedtime.   mometasone-formoterol 100-5 MCG/ACT Aero Commonly known as: DULERA Inhale 2 puffs into the lungs daily as needed for wheezing or shortness of breath.   omeprazole 20 MG capsule Commonly known as: PRILOSEC Take 1 capsule (20 mg  total) by mouth daily.   promethazine 25 MG tablet Commonly known as: PHENERGAN Take 0.5-1 tablets (12.5-25 mg total) by mouth every 6 (six) hours as needed for nausea or vomiting.   vitamin B-12 1000 MCG tablet Commonly known as: CYANOCOBALAMIN Take 1 tablet (1,000 mcg total) by mouth daily.   Vitamin D3 25 MCG (1000 UT) Caps Take 1 capsule (1,000 Units total) by mouth daily.   Xarelto 20 MG Tabs tablet Generic drug: rivaroxaban Take 1 tablet (20 mg total) by mouth daily with supper.       The results of significant diagnostics from this hospitalization (including imaging, microbiology, ancillary and laboratory) are listed below for reference.    Significant Diagnostic Studies: DG Chest Port 1 View  Result Date: 04/24/2020 CLINICAL DATA:  Chest pain EXAM: PORTABLE CHEST 1 VIEW COMPARISON:  02/19/2020 FINDINGS: Bilateral central venous catheters are again noted, stable from prior study. There is no pneumothorax. No large pleural effusion. The heart size is stable. There is no acute osseous abnormality. IMPRESSION: No active disease. Electronically Signed   By: Constance Holster M.D.   On: 04/24/2020 20:37    Microbiology: Recent Results (from the past 240 hour(s))  Respiratory Panel by RT PCR (Flu A&B, Covid) - Nasopharyngeal Swab     Status: None   Collection Time: 04/24/20  8:07 PM   Specimen: Nasopharyngeal Swab  Result Value Ref Range Status   SARS Coronavirus 2 by RT PCR NEGATIVE NEGATIVE Final    Comment: (NOTE) SARS-CoV-2 target nucleic acids are NOT DETECTED.  The SARS-CoV-2 RNA is generally detectable in upper respiratoy specimens during the acute phase of infection. The lowest concentration of SARS-CoV-2 viral copies this assay can detect is 131 copies/mL. A negative result does not preclude SARS-Cov-2 infection and should not be used as the sole basis for treatment or other patient management decisions. A negative result may occur with  improper specimen  collection/handling, submission of specimen other than nasopharyngeal swab, presence of viral mutation(s) within the areas targeted by this assay, and inadequate number of viral copies (<131 copies/mL). A negative result must be combined with clinical observations, patient history, and epidemiological information. The expected result is Negative.  Fact Sheet for Patients:  PinkCheek.be  Fact Sheet for Healthcare Providers:  GravelBags.it  This test is no t yet approved or cleared by the [place] FDA and  has been authorized for detection and/or diagnosis of SARS-CoV-2 by FDA under an Emergency Use Authorization (EUA). This EUA will remain  in effect (meaning this test can be used) for the duration of the COVID-19 declaration under Section 564(b)(1) of the Act, 21 U.S.C. section 360bbb-3(b)(1), unless the authorization is  terminated or revoked sooner.     Influenza A by PCR NEGATIVE NEGATIVE Final   Influenza B by PCR NEGATIVE NEGATIVE Final    Comment: (NOTE) The Xpert Xpress SARS-CoV-2/FLU/RSV assay is intended as an aid in  the diagnosis of influenza from Nasopharyngeal swab specimens and  should not be used as a sole basis for treatment. Nasal washings and  aspirates are unacceptable for Xpert Xpress SARS-CoV-2/FLU/RSV  testing.  Fact Sheet for Patients: PinkCheek.be  Fact Sheet for Healthcare Providers: GravelBags.it  This test is not yet approved or cleared by the [place] FDA and  has been authorized for detection and/or diagnosis of SARS-CoV-2 by  FDA under an Emergency Use Authorization (EUA). This EUA will remain  in effect (meaning this test can be used) for the duration of the  Covid-19 declaration under Section 564(b)(1) of the Act, 21  U.S.C. section 360bbb-3(b)(1), unless the authorization is  terminated or revoked. Performed at [facility], [place] [address], [place], [place] [address]      Labs: Basic Metabolic Panel: Recent Labs  Lab 04/24/20 1856 04/25/20 0429  NA 138 134*  K 3.8 3.3*  CL 108 107  CO2 23 21*  GLUCOSE 95 102*  BUN 9 7  CREATININE 0.52 0.48  CALCIUM 9.2 8.2*   Liver Function Tests: Recent Labs  Lab 04/24/20 1856 04/25/20 0429  AST 39  --   ALT 27  --   ALKPHOS 61  --   BILITOT 3.1* 2.5*  PROT 8.1  --   ALBUMIN 4.6  --    No results for input(s): LIPASE, AMYLASE in the last 168 hours. No results for input(s): AMMONIA in the last 168 hours. CBC: Recent Labs  Lab 04/24/20 1856 04/25/20 0429  WBC 18.6* 17.6*  NEUTROABS 13.2* 11.3*  HGB 8.7* 7.7*  HCT 26.0* 22.9*  MCV 90.0 90.2  PLT 595* 510*   Cardiac Enzymes: No results for input(s): CKTOTAL, CKMB, CKMBINDEX, TROPONINI in the last 168 hours. BNP: Invalid input(s): POCBNP CBG: No results for input(s): GLUCAP in the last 168 hours.  Time coordinating discharge: 35 minutes  Signed:  [name]  APRN, MSN, FNP-C Patient [place], [place] [address] [phone]  [facility] 04/28/2020, 1:46 PM

## 2020-05-08 ENCOUNTER — Other Ambulatory Visit: Payer: Self-pay | Admitting: Nurse Practitioner

## 2020-05-08 ENCOUNTER — Telehealth: Payer: Self-pay | Admitting: Nurse Practitioner

## 2020-05-08 MED ORDER — HYDROMORPHONE HCL 4 MG PO TABS: 4.0000 mg | ORAL_TABLET | Freq: Four times a day (QID) | ORAL | 0 refills | Status: DC | PRN

## 2020-05-08 NOTE — Telephone Encounter (Signed)
sent 

## 2020-05-10 ENCOUNTER — Encounter: Payer: Self-pay | Admitting: Nurse Practitioner

## 2020-05-10 DIAGNOSIS — T8089XA Other complications following infusion, transfusion and therapeutic injection, initial encounter: Secondary | ICD-10-CM | POA: Insufficient documentation

## 2020-05-17 ENCOUNTER — Ambulatory Visit: Payer: Medicare Other | Admitting: Nurse Practitioner

## 2020-05-23 IMAGING — CR DG CHEST 2V
2 series · 2 of 2 positions shown · non-contrast
Comparison: Chest radiograph 07/25/2019

CLINICAL DATA: Sickle cell crises, mid chest pain, shortness of
breath

EXAM:
CHEST - 2 VIEW

[w chest pa]
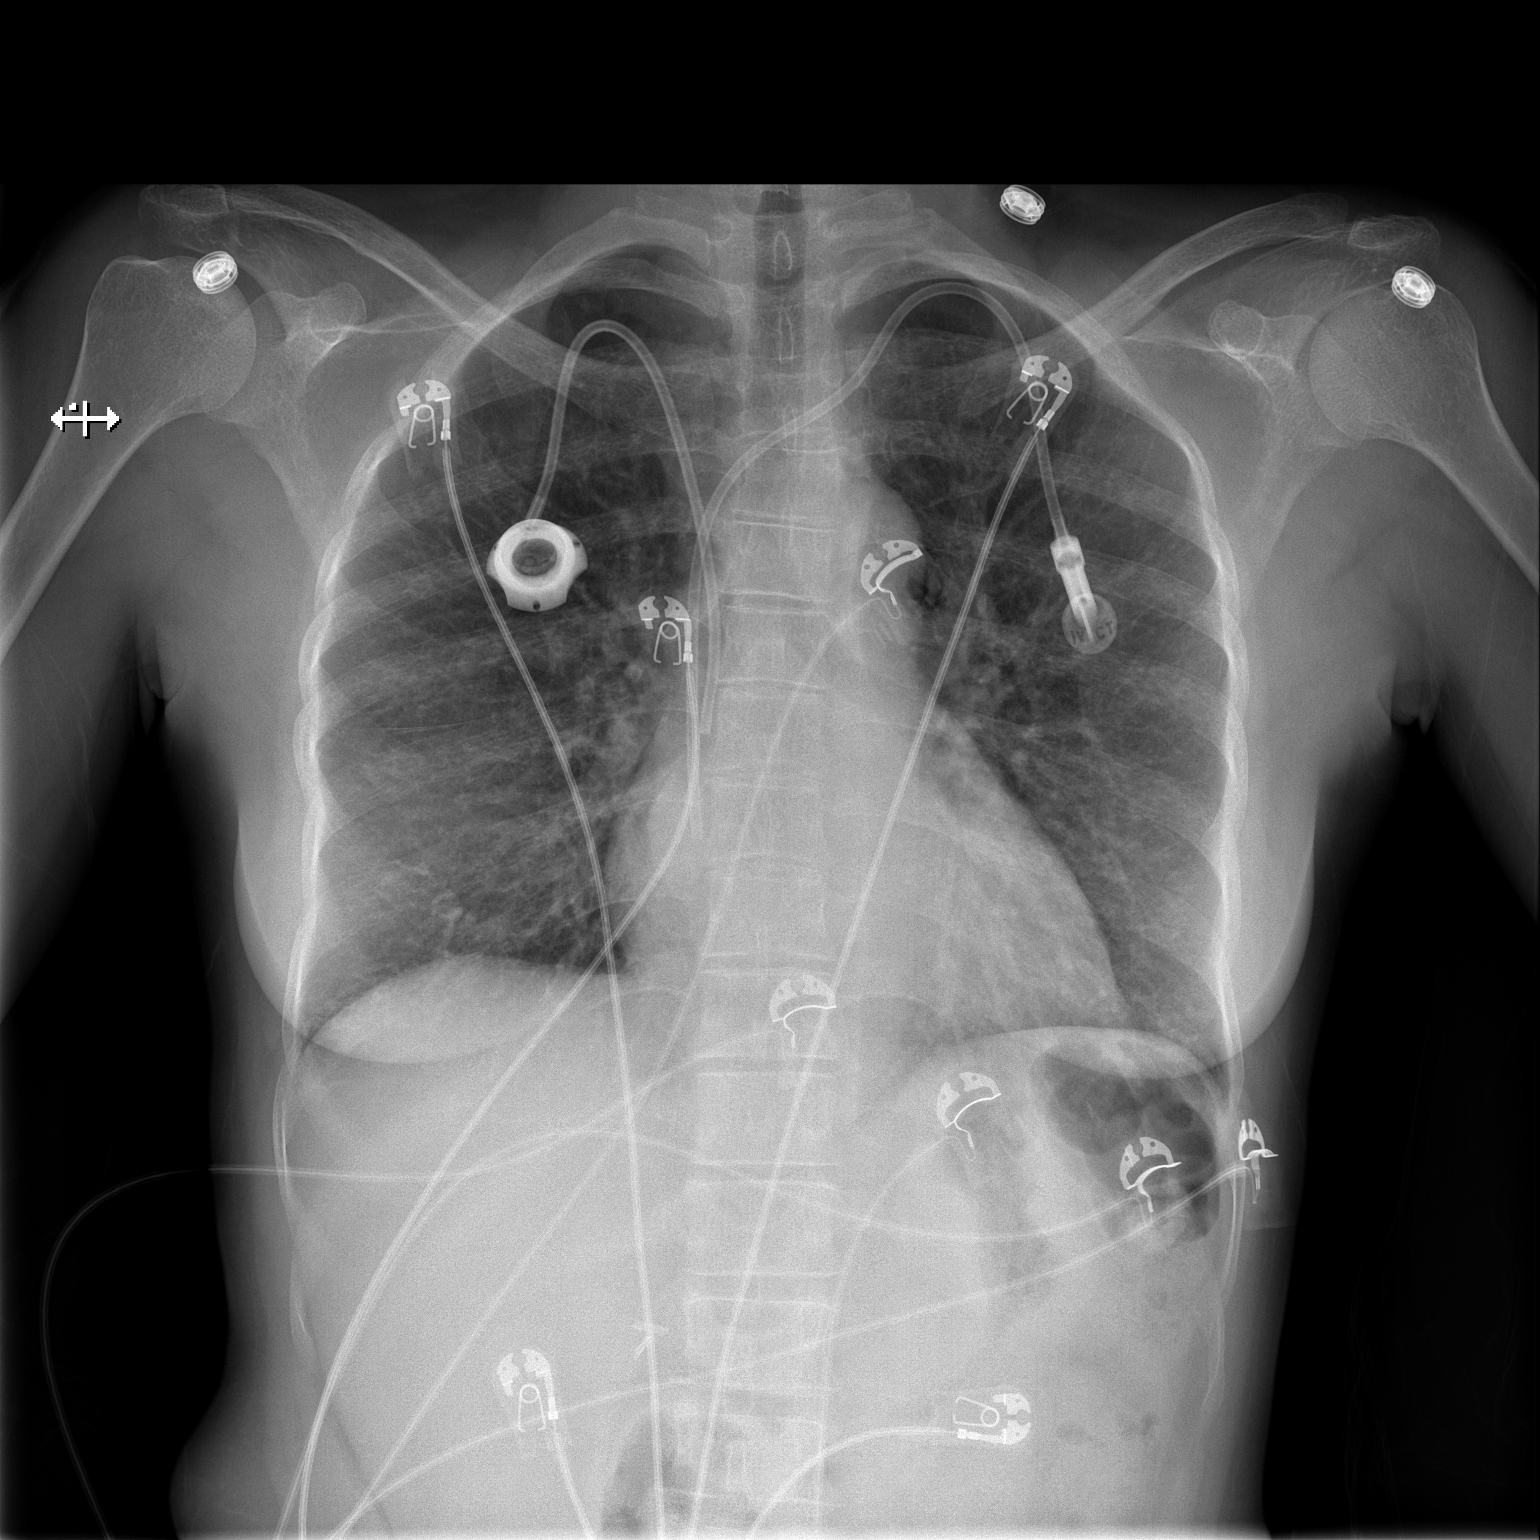

[w chest lat]
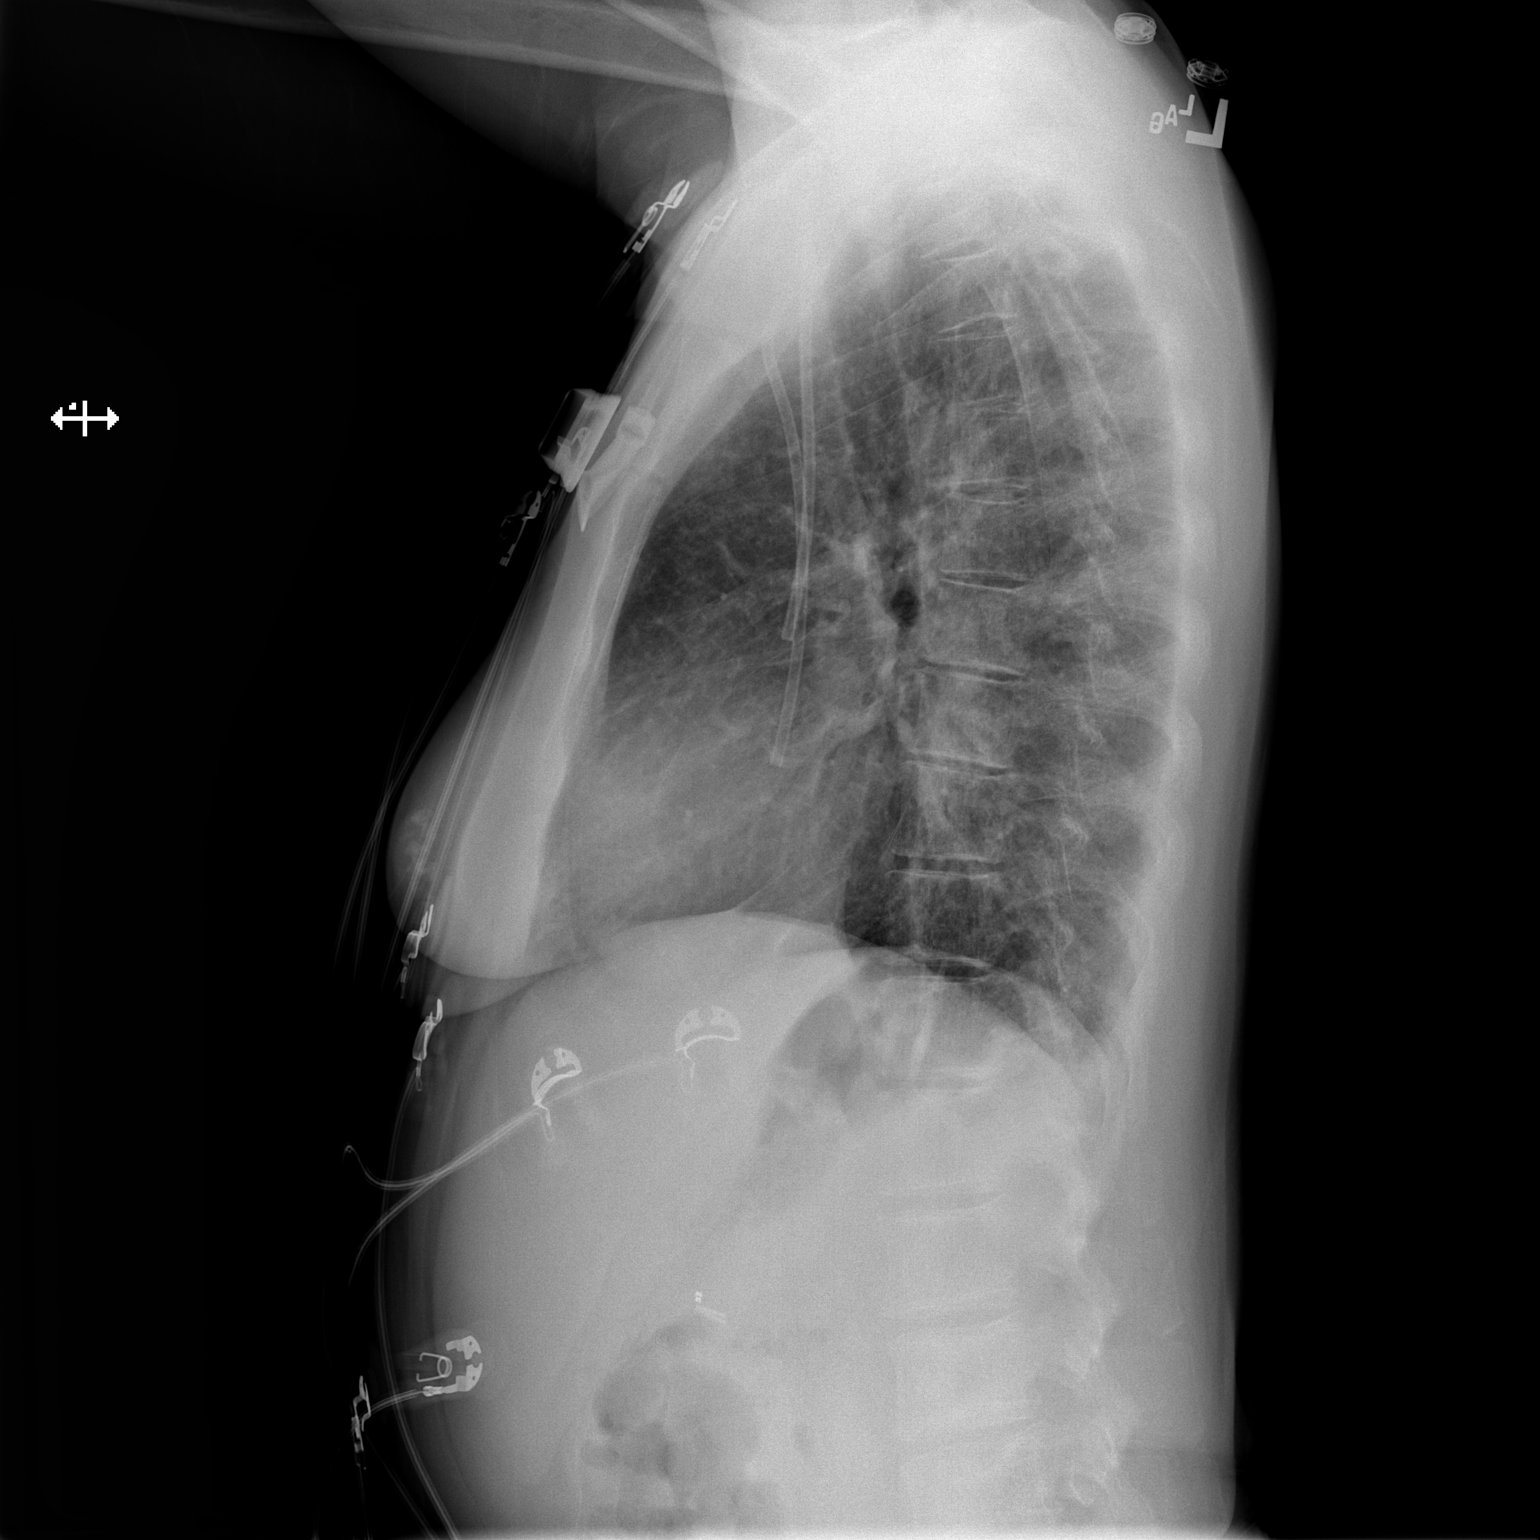

[2 of 2 positions shown; findings below may reference images not displayed]

FINDINGS: Stable cardiomediastinal contours. Unchanged appearance of bilateral
Port-A-Cath. There are chronic bilateral interstitial markings. No
new focal pulmonary opacity. No pneumothorax or pleural effusion. No
acute finding in the visualized skeleton.
IMPRESSION: No acute cardiopulmonary finding.

## 2020-05-24 ENCOUNTER — Other Ambulatory Visit: Payer: Self-pay | Admitting: Nurse Practitioner

## 2020-05-24 ENCOUNTER — Encounter: Payer: Self-pay | Admitting: Nurse Practitioner

## 2020-05-24 ENCOUNTER — Ambulatory Visit (INDEPENDENT_AMBULATORY_CARE_PROVIDER_SITE_OTHER): Payer: Medicare Other | Admitting: Nurse Practitioner

## 2020-05-24 ENCOUNTER — Other Ambulatory Visit: Payer: Self-pay

## 2020-05-24 VITALS — BP 121/69 | HR 115 | Temp 98.8°F | Resp 18 | Ht 63.0 in | Wt 130.2 lb

## 2020-05-24 DIAGNOSIS — E876 Hypokalemia: Secondary | ICD-10-CM

## 2020-05-24 DIAGNOSIS — D571 Sickle-cell disease without crisis: Secondary | ICD-10-CM | POA: Diagnosis not present

## 2020-05-24 DIAGNOSIS — D5700 Hb-SS disease with crisis, unspecified: Secondary | ICD-10-CM

## 2020-05-24 LAB — POCT URINALYSIS DIP (CLINITEK)
Bilirubin, UA: NEGATIVE
Blood, UA: NEGATIVE
Glucose, UA: NEGATIVE mg/dL
Ketones, POC UA: NEGATIVE mg/dL
Leukocytes, UA: NEGATIVE
Nitrite, UA: NEGATIVE
POC PROTEIN,UA: 30 — AB
Spec Grav, UA: 1.015 (ref 1.010–1.025)
Urobilinogen, UA: 2 E.U./dL — AB
pH, UA: 6 (ref 5.0–8.0)

## 2020-05-24 MED ORDER — HYDROMORPHONE HCL 4 MG PO TABS: 4.0000 mg | ORAL_TABLET | Freq: Four times a day (QID) | ORAL | 0 refills | Status: DC | PRN

## 2020-05-24 MED ORDER — KETOROLAC TROMETHAMINE 60 MG/2ML IM SOLN
60.0000 mg | Freq: Once | INTRAMUSCULAR | Status: AC
  Administered 2020-05-24: 60 mg via INTRAMUSCULAR

## 2020-05-24 NOTE — Progress Notes (Unsigned)
    [facility] [address] [place], [place]  [address] Phone:  [phone]   Fax:  [phone] 

## 2020-05-24 NOTE — Progress Notes (Signed)
[place], [place]  [address] Phone:  [phone]   Fax:  [phone]   Established Patient Office Visit  Subjective:  Patient ID: Molly Gutierrez, female    DOB: 1980/07/28  Age: 39 y.o. MRN: [id number]  CC:  Chief Complaint  Patient presents with  . Follow-up    HPI [facility] presents for follow up. She  has a past medical history of Asthma, Eczema, History of pulmonary embolus (PE), and Sickle cell anemia ([place]).    She admits that she was seen in the ER with a tooth ache initially and found out that was pregnant.. She admits that she later had a miscarriage. She has an daughter and son ages 79 & 70. Today she is in with 8 out of 10 chest pains.  She admits that this does affect her asthma.  Denies fever, chills, headache, cough, wheezing, shortness of breath, abdominal pain, back pain, hip pain, or leg pain. Denies any open wounds, skin irritation.  She admits that her pain crisis generally are in her chest and have been worse many years.    Past Medical History:  Diagnosis Date  . Asthma   . Eczema   . History of pulmonary embolus (PE)   . Sickle cell anemia (HCC)     Past Surgical History:  Procedure Laterality Date  . CHOLECYSTECTOMY    . ERCP    . JOINT REPLACEMENT    . PORTA CATH INSERTION    . TUBAL LIGATION    . WISDOM TOOTH EXTRACTION      Family History  Problem Relation Age of Onset  . Renal Disease Mother   . Hypertension Mother   . High Cholesterol Mother     Social History   Socioeconomic History  . Marital status: Single    Spouse name: Not on file  . Number of children: Not on file  . Years of education: Not on file  . Highest education level: Not on file  Occupational History  . Not on file  Tobacco Use  . Smoking status: Never Smoker  . Smokeless tobacco: Never Used  Vaping Use  . Vaping Use: Never used  Substance and Sexual Activity  . Alcohol use: Never  . Drug use:  Never  . Sexual activity: Not on file  Other Topics Concern  . Not on file  Social History Narrative  . Not on file   Social Determinants of Health   Financial Resource Strain:   . Difficulty of Paying Living Expenses: Not on file  Food Insecurity:   . Worried About Charity fundraiser in the Last Year: Not on file  . Ran Out of Food in the Last Year: Not on file  Transportation Needs:   . Lack of Transportation (Medical): Not on file  . Lack of Transportation (Non-Medical): Not on file  Physical Activity:   . Days of Exercise per Week: Not on file  . Minutes of Exercise per Session: Not on file  Stress:   . Feeling of Stress : Not on file  Social Connections:   . Frequency of Communication with Friends and Family: Not on file  . Frequency of Social Gatherings with Friends and Family: Not on file  . Attends Religious Services: Not on file  . Active Member of Clubs or Organizations: Not on file  . Attends Archivist Meetings: Not on file  . Marital Status: Not on file  Intimate Partner  Violence:   . Fear of Current or Ex-Partner: Not on file  . Emotionally Abused: Not on file  . Physically Abused: Not on file  . Sexually Abused: Not on file    Outpatient Medications Prior to Visit  Medication Sig Dispense Refill  . albuterol (VENTOLIN HFA) 108 (90 Base) MCG/ACT inhaler Inhale 2 puffs into the lungs 2 (two) times daily as needed for wheezing or shortness of breath. 8 g 11  . budesonide-formoterol (SYMBICORT) 80-4.5 MCG/ACT inhaler Inhale 2 puffs into the lungs 2 (two) times daily. 1 each 3  . celecoxib (CELEBREX) 200 MG capsule Take 1 capsule (200 mg total) by mouth daily. 90 capsule 3  . Cholecalciferol (VITAMIN D3) 25 MCG (1000 UT) CAPS Take 1 capsule (1,000 Units total) by mouth daily. 90 capsule 3  . Deferasirox (JADENU) 360 MG TABS Take 3 tablets (1,080 mg total) by mouth daily before breakfast. 90 tablet 11  . diphenhydrAMINE (BENADRYL) 25 mg capsule Take 25  mg by mouth 3 (three) times daily as needed for itching.    . folic acid (FOLVITE) 1 MG tablet Take 1 tablet (1 mg total) by mouth daily. 90 tablet 3  . gabapentin (NEURONTIN) 300 MG capsule Take 1 capsule (300 mg total) by mouth 3 (three) times daily. 270 capsule 3  . mirtazapine (REMERON) 45 MG tablet Take 1 tablet (45 mg total) by mouth at bedtime. 90 tablet 3  . mometasone-formoterol (DULERA) 100-5 MCG/ACT AERO Inhale 2 puffs into the lungs daily as needed for wheezing or shortness of breath. 1 each 5  . omeprazole (PRILOSEC) 20 MG capsule Take 1 capsule (20 mg total) by mouth daily. 90 capsule 3  . promethazine (PHENERGAN) 25 MG tablet Take 0.5-1 tablets (12.5-25 mg total) by mouth every 6 (six) hours as needed for nausea or vomiting. 30 tablet 11  . vitamin B-12 (CYANOCOBALAMIN) 1000 MCG tablet Take 1 tablet (1,000 mcg total) by mouth daily. 90 tablet 3  . XARELTO 20 MG TABS tablet Take 1 tablet (20 mg total) by mouth daily with supper. 90 tablet 3  . amoxicillin (AMOXIL) 875 MG tablet Take 875 mg by mouth 2 (two) times daily.  (Patient not taking: Reported on 05/24/2020)    . mirtazapine (REMERON) 15 MG tablet Take 15 mg by mouth at bedtime.     No facility-administered medications prior to visit.    Allergies  Allergen Reactions  . Cefaclor Hives and Swelling  . Hydroxyurea Palpitations and Other (See Comments)    Lowers "blood levels" and heart rate (causes HYPOtension); "it messes me up, it drops my levels and stuff"  Other reaction(s): Hypotension, Other (See Comments) "it messes me up, it drops my levels and stuff" Lower blood levels and HR  Other reaction(s): Other (See Comments) Lowers "blood levels" and heart rate (causes HYPOtension); "it messes me up, it drops my levels and stuff"  . Ketamine Palpitations and Other (See Comments)    "Pt states she has had previous reaction to ketamine. States she becomes flushed, heart races, dizzy, and feels like she is going to pass  out." Other reaction(s): Other (See Comments), Other (See Comments) "Pt states she has had previous reaction to ketamine. States she becomes flushed, heart races, dizzy, and feels like she is going to pass out." "Pt states she has had previous reaction to ketamine. States she becomes flushed, heart races, dizzy, and feels like she is going to pass out."    ROS Review of Systems  All other systems  reviewed and are negative.     Objective:    Physical Exam Constitutional:      General: She is in acute distress.  HENT:     Head: Normocephalic and atraumatic.     Nose: Nose normal.     Mouth/Throat:     Mouth: Mucous membranes are moist.  Cardiovascular:     Rate and Rhythm: Regular rhythm. Tachycardia present.     Pulses: Normal pulses.     Heart sounds: Normal heart sounds.  Pulmonary:     Breath sounds: Normal breath sounds.  Abdominal:     Palpations: Abdomen is soft.  Musculoskeletal:     Cervical back: Normal range of motion.  Skin:    General: Skin is warm and dry.     Capillary Refill: Capillary refill takes less than 2 seconds.  Neurological:     General: No focal deficit present.     Mental Status: She is alert and oriented to person, place, and time.  Psychiatric:        Mood and Affect: Mood normal.        Behavior: Behavior normal.        Thought Content: Thought content normal.     BP 121/69 (BP Location: Left Arm, Patient Position: Sitting, Cuff Size: Normal)   Pulse (!) 115   Temp 98.8 F (37.1 C) (Temporal)   Resp 18   Ht 5\' 3"  (1.6 m)   Wt 130 lb 3.2 oz (59.1 kg)   SpO2 100%   BMI 23.06 kg/m  Wt Readings from Last 3 Encounters:  05/24/20 130 lb 3.2 oz (59.1 kg)  04/27/20 145 lb 4.8 oz (65.9 kg)  04/13/20 122 lb 9.6 oz (55.6 kg)     There are no preventive care reminders to display for this patient.  There are no preventive care reminders to display for this patient.  No results found for: TSH Lab Results  Component Value Date   WBC  17.6 (H) 04/25/2020   HGB 7.7 (L) 04/25/2020   HCT 22.9 (L) 04/25/2020   MCV 90.2 04/25/2020   PLT 510 (H) 04/25/2020   Lab Results  Component Value Date   NA 134 (L) 04/25/2020   K 3.3 (L) 04/25/2020   CO2 21 (L) 04/25/2020   GLUCOSE 102 (H) 04/25/2020   BUN 7 04/25/2020   CREATININE 0.48 04/25/2020   BILITOT 2.5 (H) 04/25/2020   ALKPHOS 61 04/24/2020   AST 39 04/24/2020   ALT 27 04/24/2020   PROT 8.1 04/24/2020   ALBUMIN 4.6 04/24/2020   CALCIUM 8.2 (L) 04/25/2020   ANIONGAP 6 04/25/2020   No results found for: CHOL No results found for: HDL No results found for: LDLCALC No results found for: TRIG No results found for: CHOLHDL No results found for: HGBA1C    Assessment & Plan:   Problem List Items Addressed This Visit      Other   Sickle cell pain crisis (HCC) - Primary Toradol 60 mg IM Refill on hydromorphone Long discussion with patient concerning pain crisis and day clinic access Encourage patient to call early whenever problems arise Ensure adequate hydration. Move frequently to reduce venous thromboembolism risk. Avoid situations that could lead to dehydration or could exacerbate pain Discussed S&S of infection, seizures, stroke acute chest, DVT and how important it is to seek medical attention Take medication as directed along with pain contract and overall compliance       Other Visit Diagnoses    Hb-SS disease without  crisis ([place])       Relevant Orders   POCT URINALYSIS DIP (CLINITEK)      No orders of the defined types were placed in this encounter.   Follow-up: Return in about 3 months (around 08/22/2020).    Vevelyn Francois, NP

## 2020-05-25 ENCOUNTER — Emergency Department (HOSPITAL_COMMUNITY)
Admission: EM | Admit: 2020-05-25 | Discharge: 2020-05-26 | Disposition: A | Discharge status: Home / Self Care | Payer: Medicare Other | Attending: Emergency Medicine | Admitting: Emergency Medicine

## 2020-05-25 ENCOUNTER — Encounter (HOSPITAL_COMMUNITY): Payer: Self-pay

## 2020-05-25 ENCOUNTER — Emergency Department (HOSPITAL_COMMUNITY): Payer: Medicare Other

## 2020-05-25 ENCOUNTER — Other Ambulatory Visit: Payer: Self-pay

## 2020-05-25 DIAGNOSIS — Z7901 Long term (current) use of anticoagulants: Secondary | ICD-10-CM | POA: Insufficient documentation

## 2020-05-25 DIAGNOSIS — Z96649 Presence of unspecified artificial hip joint: Secondary | ICD-10-CM | POA: Insufficient documentation

## 2020-05-25 DIAGNOSIS — I2699 Other pulmonary embolism without acute cor pulmonale: Secondary | ICD-10-CM | POA: Insufficient documentation

## 2020-05-25 DIAGNOSIS — R079 Chest pain, unspecified: Secondary | ICD-10-CM | POA: Insufficient documentation

## 2020-05-25 DIAGNOSIS — J4520 Mild intermittent asthma, uncomplicated: Secondary | ICD-10-CM | POA: Diagnosis not present

## 2020-05-25 DIAGNOSIS — D57219 Sickle-cell/Hb-C disease with crisis, unspecified: Secondary | ICD-10-CM | POA: Diagnosis present

## 2020-05-25 DIAGNOSIS — D5700 Hb-SS disease with crisis, unspecified: Secondary | ICD-10-CM

## 2020-05-25 LAB — RETICULOCYTES
Immature Retic Fract: 15.7 % (ref 2.3–15.9)
RBC.: 2.77 MIL/uL — ABNORMAL LOW (ref 3.87–5.11)
Retic Count, Absolute: 172.6 10*3/uL (ref 19.0–186.0)
Retic Ct Pct: 6.2 % — ABNORMAL HIGH (ref 0.4–3.1)

## 2020-05-25 LAB — I-STAT BETA HCG BLOOD, ED (MC, WL, AP ONLY): I-stat hCG, quantitative: <5 m[IU]/mL (ref <5)

## 2020-05-25 LAB — CBC WITH DIFFERENTIAL/PLATELET
Abs Immature Granulocytes: 0.08 10*3/uL — ABNORMAL HIGH (ref 0.00–0.07)
Basophils Absolute: 0.1 10*3/uL (ref 0.0–0.1)
Basophils Relative: 1 %
Eosinophils Absolute: 0.4 10*3/uL (ref 0.0–0.5)
Eosinophils Relative: 2 %
HCT: 24.8 % — ABNORMAL LOW (ref 36.0–46.0)
Hemoglobin: 8.7 g/dL — ABNORMAL LOW (ref 12.0–15.0)
Immature Granulocytes: 1 %
Lymphocytes Relative: 23 %
Lymphs Abs: 3.8 10*3/uL (ref 0.7–4.0)
MCH: 31.1 pg (ref 26.0–34.0)
MCHC: 35.1 g/dL (ref 30.0–36.0)
MCV: 88.6 fL (ref 80.0–100.0)
Monocytes Absolute: 2.3 10*3/uL — ABNORMAL HIGH (ref 0.1–1.0)
Monocytes Relative: 14 %
Neutro Abs: 9.6 10*3/uL — ABNORMAL HIGH (ref 1.7–7.7)
Neutrophils Relative %: 59 %
Platelets: 603 10*3/uL — ABNORMAL HIGH (ref 150–400)
RBC: 2.8 MIL/uL — ABNORMAL LOW (ref 3.87–5.11)
RDW: 18 % — ABNORMAL HIGH (ref 11.5–15.5)
WBC: 16.3 10*3/uL — ABNORMAL HIGH (ref 4.0–10.5)
nRBC: 0.2 % (ref 0.0–0.2)

## 2020-05-25 MED ORDER — DIPHENHYDRAMINE HCL 25 MG PO CAPS
25.0000 mg | ORAL_CAPSULE | Freq: Four times a day (QID) | ORAL | Status: DC | PRN
  Administered 2020-05-25 – 2020-05-26 (×2): 25 mg via ORAL
  Filled 2020-05-25 (×2): qty 1

## 2020-05-25 MED ORDER — KETOROLAC TROMETHAMINE 15 MG/ML IJ SOLN
15.0000 mg | INTRAMUSCULAR | Status: AC
  Administered 2020-05-25: 15 mg via INTRAVENOUS
  Filled 2020-05-25: qty 1

## 2020-05-25 MED ORDER — HYDROMORPHONE HCL 2 MG/ML IJ SOLN
2.0000 mg | INTRAMUSCULAR | Status: AC | PRN
  Administered 2020-05-25 – 2020-05-26 (×2): 2 mg via INTRAVENOUS
  Filled 2020-05-25 (×2): qty 1

## 2020-05-25 MED ORDER — ONDANSETRON HCL 4 MG/2ML IJ SOLN
4.0000 mg | INTRAMUSCULAR | Status: DC | PRN
  Administered 2020-05-25: 4 mg via INTRAVENOUS
  Filled 2020-05-25: qty 2

## 2020-05-25 MED ORDER — DEXTROSE-NACL 5-0.45 % IV SOLN: INTRAVENOUS | Status: DC

## 2020-05-25 NOTE — ED Provider Notes (Signed)
[facility] DEPT Provider Note   CSN: [id number] Arrival date & time: 05/25/20  2124     History Chief Complaint  Patient presents with  . Sickle Cell Pain Crisis    Molly Gutierrez is a 39 y.o. female.  39 year old female with a medical history significant for sickle cell disease, chronic pain syndrome, opiate dependence and tolerance, history of pulmonary embolism on Xarelto, mild intermittent asthma presents to the emergency department for complaints of chest pain.  She has been experiencing chest pain for 2 days.  Describes an achy throbbing pain across the center of her chest.  It is similar to past episodes of sickle cell crisis.  She has been using her home hydromorphone without symptomatic relief.  States that she has not reached out to the sickle cell day clinic because she was trying to see if she could manage at home.  Was seen by her outpatient physician yesterday and given a shot of Toradol in addition to a refill of her pain medication.  Has not experienced any fever, syncope, hemoptysis, leg swelling, abdominal pain, diarrhea.  She did have a few episodes of vomiting prior to arrival making it difficult for her to tolerate her oral pain medications.  Currently rates pain at 9/10.        Past Medical History:  Diagnosis Date  . Asthma   . Eczema   . History of pulmonary embolus (PE)   . Sickle cell anemia [facility])     Patient Active Problem List   Diagnosis Date Noted  . 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 Sickle cell anemia with pain ([place]) 04/24/2020  . Mild intermittent asthma 03/31/2020  . Sickle cell anemia with crisis ([place]) 01/07/2020  . Sickle cell crisis acute chest syndrome ([place]) 07/29/2019  . Drug-seeking behavior 07/07/2019  . Sinus tachycardia 07/07/2019  . Therapeutic opioid-induced constipation (OIC) 07/07/2019  . Breast mass in female 06/29/2019  . Atelectasis of right lung 06/29/2019  . Sickle cell crisis  ([place]) 06/27/2019  . Sickle cell pain crisis ([place]) 04/19/2019  . Pneumonia 03/27/2019  . 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 Anterior chest wall pain 11/13/2018  . Epigastric abdominal pain 11/13/2018  . Hematuria 11/13/2018  . Hyperbilirubinemia 11/12/2018  . Long term current use of anticoagulant therapy 11/12/2018  . History of pulmonary embolism 09/26/2018  . Acute pulmonary embolism ([place]) 09/26/2018  . Opioid dependence ([place]) 09/13/2018  . Port-A-Cath in place 09/13/2018  . Narcotic abuse, continuous ([place]) 08/28/2018  . Anemia 07/03/2018  . Personal history of other venous thrombosis and embolism 07/03/2018  . History of transfusion 07/03/2018  . Gastro-esophageal reflux disease without esophagitis 06/02/2018  . Asthma 05/19/2018  . Anxiety and depression 10/30/2017  . At risk for sepsis 06/13/2017  . Mitral regurgitation 02/01/2014  . Itching 12/13/2013  . Nausea & vomiting 12/13/2013  . 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 Frequent complaints of pain 11/01/2013  . Hypokalemia 09/19/2013  . S/P total hip arthroplasty 05/19/2013  . 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 Lower urinary tract infectious disease 03/02/2013  . Chest pain 11/14/2012  . Sickle-cell anemia ([place]) 10/30/2012  . Pain management 08/01/2012  . Reticulocytosis 07/31/2012  . 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 Essential (hemorrhagic) thrombocythemia ([place]) 04/03/2012  . Leukocytosis 03/27/2012  . History of gestational diabetes 10/07/2011  . Hb-SS disease with crisis, unspecified ([place]) 06/25/2010    Past Surgical History:  Procedure  Laterality Date  . CHOLECYSTECTOMY    . ERCP    . JOINT REPLACEMENT    . PORTA CATH INSERTION    . TUBAL LIGATION    . WISDOM TOOTH EXTRACTION       OB History   No obstetric history on file.     Family History  Problem Relation Age of Onset  . Renal  Disease Mother   . Hypertension Mother   . High Cholesterol Mother     Social History   Tobacco Use  . Smoking status: Never Smoker  . Smokeless tobacco: Never Used  Vaping Use  . Vaping Use: Never used  Substance Use Topics  . Alcohol use: Never  . Drug use: Never    Home Medications Prior to Admission medications   Medication Sig Start Date End Date Taking? Authorizing Provider  albuterol (VENTOLIN HFA) 108 (90 Base) MCG/ACT inhaler Inhale 2 puffs into the lungs 2 (two) times daily as needed for wheezing or shortness of breath. 04/13/20 04/13/21  Vevelyn Francois, NP  budesonide-formoterol (SYMBICORT) 80-4.5 MCG/ACT inhaler Inhale 2 puffs into the lungs 2 (two) times daily. 04/17/20   Vevelyn Francois, NP  celecoxib (CELEBREX) 200 MG capsule Take 1 capsule (200 mg total) by mouth daily. 04/13/20 04/13/21  Vevelyn Francois, NP  Cholecalciferol (VITAMIN D3) 25 MCG (1000 UT) CAPS Take 1 capsule (1,000 Units total) by mouth daily. 04/13/20 04/13/21  Vevelyn Francois, NP  Deferasirox (JADENU) 360 MG TABS Take 3 tablets (1,080 mg total) by mouth daily before breakfast. 04/13/20 04/13/21  Vevelyn Francois, NP  diphenhydrAMINE (BENADRYL) 25 mg capsule Take 25 mg by mouth 3 (three) times daily as needed for itching.    [provider]  folic acid (FOLVITE) 1 MG tablet Take 1 tablet (1 mg total) by mouth daily. 04/13/20 04/13/21  Vevelyn Francois, NP  gabapentin (NEURONTIN) 300 MG capsule Take 1 capsule (300 mg total) by mouth 3 (three) times daily. 04/13/20 04/13/21  Vevelyn Francois, NP  HYDROmorphone (DILAUDID) 4 MG tablet Take 1 tablet (4 mg total) by mouth every 6 (six) hours as needed for up to 15 days for moderate pain or severe pain. 05/24/20 06/08/20  Vevelyn Francois, NP  mirtazapine (REMERON) 45 MG tablet Take 1 tablet (45 mg total) by mouth at bedtime. 04/13/20 04/13/21  Vevelyn Francois, NP  mometasone-formoterol (DULERA) 100-5 MCG/ACT AERO Inhale 2 puffs into the lungs daily as  needed for wheezing or shortness of breath. 04/13/20 04/13/21  Vevelyn Francois, NP  omeprazole (PRILOSEC) 20 MG capsule Take 1 capsule (20 mg total) by mouth daily. 04/13/20 04/13/21  Vevelyn Francois, NP  promethazine (PHENERGAN) 25 MG tablet Take 0.5-1 tablets (12.5-25 mg total) by mouth every 6 (six) hours as needed for nausea or vomiting. 04/13/20 04/13/21  Vevelyn Francois, NP  vitamin B-12 (CYANOCOBALAMIN) 1000 MCG tablet Take 1 tablet (1,000 mcg total) by mouth daily. 04/13/20 04/13/21  Vevelyn Francois, NP  XARELTO 20 MG TABS tablet Take 1 tablet (20 mg total) by mouth daily with supper. 04/13/20 04/13/21  Vevelyn Francois, NP    Allergies    Cefaclor, Hydroxyurea, and Ketamine  Review of Systems   Review of Systems  Ten systems reviewed and are negative for acute change, except as noted in the HPI.    Physical Exam Updated Vital Signs BP 97/70 (BP Location: Right Arm)   Pulse 81   Temp 98.5 F (36.9 C) (Oral)  Resp 15   Ht 5\' 3"  (1.6 m) Comment: Simultaneous filing. User may not have seen previous data.  Wt 59 kg Comment: Simultaneous filing. User may not have seen previous data.  SpO2 96%   BMI 23.03 kg/m   Physical Exam Vitals and nursing note reviewed.  Constitutional:      General: She is not in acute distress.    Appearance: She is well-developed and well-nourished. She is not diaphoretic.     Comments: Nontoxic appearing and in NAD  HENT:     Head: Normocephalic and atraumatic.  Eyes:     General: No scleral icterus.    Extraocular Movements: EOM normal.     Conjunctiva/sclera: Conjunctivae normal.  Pulmonary:     Effort: Pulmonary effort is normal. No respiratory distress.     Comments: Respirations even and unlabored Musculoskeletal:        General: Normal range of motion.     Cervical back: Normal range of motion.  Skin:    General: Skin is warm and dry.     Coloration: Skin is not pale.     Findings: No erythema or rash.  Neurological:     Mental  Status: She is alert and oriented to person, place, and time.     Coordination: Coordination normal.     Comments: Moving all extremities spontaneously.  Psychiatric:        Mood and Affect: Mood and affect normal.        Behavior: Behavior normal.     ED Results / Procedures / Treatments   Labs (all labs ordered are listed, but only abnormal results are displayed) Labs Reviewed  CBC WITH DIFFERENTIAL/PLATELET - Abnormal; Notable for the following components:      Result Value   WBC 16.3 (*)    RBC 2.80 (*)    Hemoglobin 8.7 (*)    HCT 24.8 (*)    RDW 18.0 (*)    Platelets 603 (*)    Neutro Abs 9.6 (*)    Monocytes Absolute 2.3 (*)    Abs Immature Granulocytes 0.08 (*)    All other components within normal limits  COMPREHENSIVE METABOLIC PANEL - Abnormal; Notable for the following components:   Glucose, Bld 104 (*)    Calcium 8.6 (*)    Total Bilirubin 2.5 (*)    All other components within normal limits  RETICULOCYTES - Abnormal; Notable for the following components:   Retic Ct Pct 6.2 (*)    RBC. 2.77 (*)    All other components within normal limits  I-STAT BETA HCG BLOOD, ED ([facility], [facility], [facility] ONLY)  TROPONIN I (HIGH SENSITIVITY)    EKG EKG Interpretation  Date/Time:  Thursday May 25 2020 21:37:42 EST Ventricular Rate:  96 PR Interval:    QRS Duration: 70 QT Interval:  456 QTC Calculation: 577 R Axis:   45 Text Interpretation: Sinus rhythm Borderline T abnormalities, anterior leads Prolonged QT interval 12 Lead; [place] No significant change since last tracing Confirmed by Pryor Curia [phone]) on 05/26/2020 2:57:59 AM   Radiology DG Chest 2 View  Result Date: 05/25/2020 CLINICAL DATA:  Chest pain sickle cell crisis EXAM: CHEST - 2 VIEW COMPARISON:  04/24/2020 FINDINGS: Bilateral central venous ports with right port tip over the SVC and left port tip over the right atrium. Stable cardiomediastinal silhouette. No focal airspace disease, effusion or  pneumothorax. IMPRESSION: No active cardiopulmonary disease. Electronically Signed   By: Donavan Foil M.D.   On: 05/25/2020 22:45  Procedures Procedures (including critical care time)  Medications Ordered in ED Medications  dextrose 5 %-0.45 % sodium chloride infusion ( Intravenous New Bag/Given 05/25/20 2322)  ondansetron (ZOFRAN) injection 4 mg (4 mg Intravenous Given 05/25/20 2305)  diphenhydrAMINE (BENADRYL) capsule 25-50 mg (25 mg Oral Given 05/26/20 0100)  ketorolac (TORADOL) 15 MG/ML injection 15 mg (15 mg Intravenous Given 05/25/20 2305)  HYDROmorphone (DILAUDID) injection 2 mg (2 mg Intravenous Given 05/26/20 0100)  HYDROmorphone (DILAUDID) injection 2 mg (2 mg Intravenous Given 05/26/20 0224)  heparin lock flush 100 unit/mL (500 Units Intracatheter Given 05/26/20 0245)    ED Course  I have reviewed the triage vital signs and the nursing notes.  Pertinent labs & imaging results that were available during my care of the patient were reviewed by me and considered in my medical decision making (see chart for details).  Clinical Course as of 05/26/20 0249  Fri May 26, 2020  4492 Patient due for second dose of pain medication. VSS. Also requesting another tablet of Benadryl for itching. RN aware. Labs and Hgb stable. [KH]  0230 Patient rates pain at 7/10. States that she feels well enough for discharge and f/u with the day clinic PRN.  [KH]    Clinical Course User Index [KH] Antonietta Breach, PA-C   MDM Rules/Calculators/A&P                          39 year old female presenting to the ED for evaluation of chest pain consistent with past episodes of sickle cell crisis.  She has no findings of acute chest syndrome on ED evaluation.  Has been given IV Toradol as well as Dilaudid for pain with interval improvement.  Vitals have been stable.  On reassessment, she expresses comfort with plan for discharge and outpatient follow-up.  Emphasized presentation to sickle cell day clinic should  pain persist.  Return precautions discussed and provided. Patient discharged in stable condition with no unaddressed concerns.   Final Clinical Impression(s) / ED Diagnoses Final diagnoses:  Sickle cell anemia with pain [facility])    Rx / DC Orders ED Discharge Orders    None       Antonietta Breach, PA-C 05/26/20 0258    Lorelle Gibbs, DO 05/26/20 2359

## 2020-05-25 NOTE — ED Notes (Signed)
Patient transported to X-ray 

## 2020-05-25 NOTE — ED Triage Notes (Signed)
Pt sts scc pain since yesterday. Pain is in central chest and is her typical pain location. Pt sts she takes hydromorphone 75mq4-6hrs at home. Rates pain as a 9.

## 2020-05-26 DIAGNOSIS — D57219 Sickle-cell/Hb-C disease with crisis, unspecified: Secondary | ICD-10-CM | POA: Diagnosis not present

## 2020-05-26 LAB — COMPREHENSIVE METABOLIC PANEL
ALT: 23 U/L (ref 0–44)
AST: 33 U/L (ref 15–41)
Albumin: 4.5 g/dL (ref 3.5–5.0)
Alkaline Phosphatase: 68 U/L (ref 38–126)
Anion gap: 8 (ref 5–15)
BUN: 10 mg/dL (ref 6–20)
CO2: 24 mmol/L (ref 22–32)
Calcium: 8.6 mg/dL — ABNORMAL LOW (ref 8.9–10.3)
Chloride: 103 mmol/L (ref 98–111)
Creatinine, Ser: 0.46 mg/dL (ref 0.44–1.00)
GFR, Estimated: >60 mL/min (ref >60)
Glucose, Bld: 104 mg/dL — ABNORMAL HIGH (ref 70–99)
Potassium: 3.7 mmol/L (ref 3.5–5.1)
Sodium: 135 mmol/L (ref 135–145)
Total Bilirubin: 2.5 mg/dL — ABNORMAL HIGH (ref 0.3–1.2)
Total Protein: 8 g/dL (ref 6.5–8.1)

## 2020-05-26 LAB — TROPONIN I (HIGH SENSITIVITY): Troponin I (High Sensitivity): <2 ng/L (ref <18)

## 2020-05-26 MED ORDER — HEPARIN SOD (PORK) LOCK FLUSH 100 UNIT/ML IV SOLN
500.0000 [IU] | Freq: Once | INTRAVENOUS | Status: AC
  Administered 2020-05-26: 500 [IU]
  Filled 2020-05-26: qty 5

## 2020-05-26 MED ORDER — HYDROMORPHONE HCL 2 MG/ML IJ SOLN
2.0000 mg | Freq: Once | INTRAMUSCULAR | Status: AC
Start: 2020-05-26 — End: 2020-05-26
  Administered 2020-05-26: 2 mg via INTRAVENOUS
  Filled 2020-05-26: qty 1

## 2020-05-26 NOTE — Discharge Instructions (Signed)
Continue your home prescribed medications.  Follow-up with the sickle cell day clinic if you have persistent discomfort.  You may return for new or concerning symptoms.

## 2020-05-29 ENCOUNTER — Telehealth: Payer: Medicare Other | Admitting: Nurse Practitioner

## 2020-05-31 ENCOUNTER — Telehealth (HOSPITAL_COMMUNITY): Payer: Self-pay | Admitting: General Practice

## 2020-05-31 NOTE — Telephone Encounter (Signed)
Patient called, requesting to come to the day hospital due to pain in the lower back rated at 9/10. Denied chest pain, fever, diarrhea, abdominal pain, nausea/vomitting. Screened negative for Covid-19 symptoms. Admitted to having means of transportation without driving self after treatment. Last took 4 mg of dilaudid at 04:30 am today. Per provider, since patient is currently in [place], [place], for pain control patient should go to the nearest ER close to her residence instead of coming all the way to [place], [place]. Patient notified, verbalized understanding.

## 2020-06-02 LAB — DRUG SCREEN 764883 11+OXYCO+ALC+CRT-BUND
Amphetamines, Urine: NEGATIVE ng/mL
BENZODIAZ UR QL: NEGATIVE ng/mL
Barbiturate: NEGATIVE ng/mL
Cannabinoid Quant, Ur: NEGATIVE ng/mL
Cocaine (Metabolite): NEGATIVE ng/mL
Creatinine: 92.9 mg/dL (ref 20.0–300.0)
Ethanol: NEGATIVE %
Meperidine: NEGATIVE ng/mL
Methadone Screen, Urine: NEGATIVE ng/mL
Oxycodone/Oxymorphone, Urine: NEGATIVE ng/mL
Phencyclidine: NEGATIVE ng/mL
Propoxyphene: NEGATIVE ng/mL
Tramadol: NEGATIVE ng/mL
pH, Urine: 5.8 (ref 4.5–8.9)

## 2020-06-02 LAB — OPIATES CONFIRMATION, URINE
Codeine: NEGATIVE
Hydrocodone: NEGATIVE
Hydromorphone Confirm: >3000 ng/mL
Hydromorphone: POSITIVE — AB
Morphine: NEGATIVE
Opiates: POSITIVE ng/mL — AB

## 2020-06-04 ENCOUNTER — Encounter (HOSPITAL_COMMUNITY): Payer: Self-pay | Admitting: Emergency Medicine

## 2020-06-04 ENCOUNTER — Emergency Department (HOSPITAL_COMMUNITY): Payer: Medicare Other

## 2020-06-04 ENCOUNTER — Emergency Department (HOSPITAL_COMMUNITY)
Admission: EM | Admit: 2020-06-04 | Discharge: 2020-06-04 | Disposition: A | Discharge status: Home / Self Care | Payer: Medicare Other | Attending: Emergency Medicine | Admitting: Emergency Medicine

## 2020-06-04 ENCOUNTER — Other Ambulatory Visit: Payer: Self-pay

## 2020-06-04 DIAGNOSIS — M546 Pain in thoracic spine: Secondary | ICD-10-CM | POA: Diagnosis not present

## 2020-06-04 DIAGNOSIS — Z7951 Long term (current) use of inhaled steroids: Secondary | ICD-10-CM | POA: Insufficient documentation

## 2020-06-04 DIAGNOSIS — Z7901 Long term (current) use of anticoagulants: Secondary | ICD-10-CM | POA: Insufficient documentation

## 2020-06-04 DIAGNOSIS — D57219 Sickle-cell/Hb-C disease with crisis, unspecified: Secondary | ICD-10-CM | POA: Insufficient documentation

## 2020-06-04 DIAGNOSIS — Z96649 Presence of unspecified artificial hip joint: Secondary | ICD-10-CM | POA: Diagnosis not present

## 2020-06-04 DIAGNOSIS — D5700 Hb-SS disease with crisis, unspecified: Secondary | ICD-10-CM

## 2020-06-04 DIAGNOSIS — Z86711 Personal history of pulmonary embolism: Secondary | ICD-10-CM | POA: Insufficient documentation

## 2020-06-04 DIAGNOSIS — Z20822 Contact with and (suspected) exposure to covid-19: Secondary | ICD-10-CM | POA: Diagnosis not present

## 2020-06-04 DIAGNOSIS — Z86718 Personal history of other venous thrombosis and embolism: Secondary | ICD-10-CM | POA: Insufficient documentation

## 2020-06-04 DIAGNOSIS — J4520 Mild intermittent asthma, uncomplicated: Secondary | ICD-10-CM | POA: Insufficient documentation

## 2020-06-04 LAB — CBC WITH DIFFERENTIAL/PLATELET
Abs Immature Granulocytes: 0.06 10*3/uL (ref 0.00–0.07)
Basophils Absolute: 0 10*3/uL (ref 0.0–0.1)
Basophils Relative: 0 %
Eosinophils Absolute: 0.3 10*3/uL (ref 0.0–0.5)
Eosinophils Relative: 2 %
HCT: 22.6 % — ABNORMAL LOW (ref 36.0–46.0)
Hemoglobin: 7.6 g/dL — ABNORMAL LOW (ref 12.0–15.0)
Immature Granulocytes: 1 %
Lymphocytes Relative: 26 %
Lymphs Abs: 3.3 10*3/uL (ref 0.7–4.0)
MCH: 29.9 pg (ref 26.0–34.0)
MCHC: 33.6 g/dL (ref 30.0–36.0)
MCV: 89 fL (ref 80.0–100.0)
Monocytes Absolute: 1.9 10*3/uL — ABNORMAL HIGH (ref 0.1–1.0)
Monocytes Relative: 15 %
Neutro Abs: 7.1 10*3/uL (ref 1.7–7.7)
Neutrophils Relative %: 56 %
Platelets: 460 10*3/uL — ABNORMAL HIGH (ref 150–400)
RBC: 2.54 MIL/uL — ABNORMAL LOW (ref 3.87–5.11)
RDW: 18.5 % — ABNORMAL HIGH (ref 11.5–15.5)
WBC: 12.6 10*3/uL — ABNORMAL HIGH (ref 4.0–10.5)
nRBC: 0.2 % (ref 0.0–0.2)

## 2020-06-04 LAB — RETICULOCYTES
Immature Retic Fract: 12.9 % (ref 2.3–15.9)
RBC.: 2.51 MIL/uL — ABNORMAL LOW (ref 3.87–5.11)
Retic Count, Absolute: 227.9 10*3/uL — ABNORMAL HIGH (ref 19.0–186.0)
Retic Ct Pct: 9.1 % — ABNORMAL HIGH (ref 0.4–3.1)

## 2020-06-04 LAB — COMPREHENSIVE METABOLIC PANEL
ALT: 28 U/L (ref 0–44)
AST: 34 U/L (ref 15–41)
Albumin: 4.2 g/dL (ref 3.5–5.0)
Alkaline Phosphatase: 57 U/L (ref 38–126)
Anion gap: 8 (ref 5–15)
BUN: 8 mg/dL (ref 6–20)
CO2: 23 mmol/L (ref 22–32)
Calcium: 8.9 mg/dL (ref 8.9–10.3)
Chloride: 107 mmol/L (ref 98–111)
Creatinine, Ser: 0.76 mg/dL (ref 0.44–1.00)
GFR, Estimated: >60 mL/min (ref >60)
Glucose, Bld: 100 mg/dL — ABNORMAL HIGH (ref 70–99)
Potassium: 3.9 mmol/L (ref 3.5–5.1)
Sodium: 138 mmol/L (ref 135–145)
Total Bilirubin: 4.1 mg/dL — ABNORMAL HIGH (ref 0.3–1.2)
Total Protein: 7.7 g/dL (ref 6.5–8.1)

## 2020-06-04 LAB — RESP PANEL BY RT-PCR (FLU A&B, COVID) ARPGX2
Influenza A by PCR: NEGATIVE
Influenza B by PCR: NEGATIVE
SARS Coronavirus 2 by RT PCR: NEGATIVE

## 2020-06-04 LAB — HCG, QUANTITATIVE, PREGNANCY: hCG, Beta Chain, Quant, S: <1 m[IU]/mL (ref <5)

## 2020-06-04 MED ORDER — HYDROMORPHONE HCL 2 MG/ML IJ SOLN
2.0000 mg | INTRAMUSCULAR | Status: AC
  Administered 2020-06-04: 2 mg via SUBCUTANEOUS
  Filled 2020-06-04 (×2): qty 1

## 2020-06-04 MED ORDER — HEPARIN SOD (PORK) LOCK FLUSH 100 UNIT/ML IV SOLN
500.0000 [IU] | Freq: Once | INTRAVENOUS | Status: AC
  Administered 2020-06-04: 500 [IU]
  Filled 2020-06-04: qty 5

## 2020-06-04 MED ORDER — HYDROMORPHONE HCL 2 MG/ML IJ SOLN
2.0000 mg | Freq: Once | INTRAMUSCULAR | Status: AC
  Administered 2020-06-04: 2 mg via INTRAVENOUS
  Filled 2020-06-04: qty 1

## 2020-06-04 MED ORDER — KETOROLAC TROMETHAMINE 30 MG/ML IJ SOLN
30.0000 mg | Freq: Once | INTRAMUSCULAR | Status: AC
  Administered 2020-06-04: 30 mg via INTRAMUSCULAR
  Filled 2020-06-04: qty 1

## 2020-06-04 MED ORDER — HYDROMORPHONE HCL 2 MG/ML IJ SOLN
2.0000 mg | INTRAMUSCULAR | Status: AC
  Administered 2020-06-04: 2 mg via SUBCUTANEOUS

## 2020-06-04 MED ORDER — DIPHENHYDRAMINE HCL 25 MG PO CAPS
25.0000 mg | ORAL_CAPSULE | ORAL | Status: DC | PRN
  Administered 2020-06-04 (×2): 50 mg via ORAL
  Filled 2020-06-04 (×2): qty 2

## 2020-06-04 NOTE — Discharge Instructions (Signed)
Please continue using your home pain medications and if you are still having severe pain in the morning the sickle cell team would like for you to come to the sickle cell center first thing in the morning for continued treatment.

## 2020-06-04 NOTE — ED Notes (Signed)
Discharge paperwork reviewed with pt.  Pt verbalized understanding, ambulatory to ED exit.

## 2020-06-04 NOTE — ED Provider Notes (Signed)
Care assumed from [place], please see her note for full details, but in brief Molly Gutierrez is a 39 y.o. female with hx of sickle cell anemia, who presents with 3 days of pain in her chest and back which is typical for her sickle cell crisis. She was seen 2 days ago at [facility] for similar pain, received evaluation and treatment with pain medication and was discharged home.  No fevers, cough or shortness of breath.  Patient is on Xarelto due to prior history of PE.  Plan: Reevaluate after subcu pain medication.  Patient refused labs without port access, lab work was reviewed from 2 days ago and was overall reassuring, her chest x-ray today is clear, prior care team planned to hold off on lab work pending reevaluation after pain medications   BP 90/63 (BP Location: Left Arm)   Pulse 84   Temp 98.9 F (37.2 C) (Oral)   Resp 16   Ht 5\' 3"  (1.6 m)   Wt 59 kg   SpO2 98%   BMI 23.03 kg/m   ED Course/Procedures   Labs Reviewed  COMPREHENSIVE METABOLIC PANEL - Abnormal; Notable for the following components:      Result Value   Glucose, Bld 100 (*)    Total Bilirubin 4.1 (*)    All other components within normal limits  CBC WITH DIFFERENTIAL/PLATELET - Abnormal; Notable for the following components:   WBC 12.6 (*)    RBC 2.54 (*)    Hemoglobin 7.6 (*)    HCT 22.6 (*)    RDW 18.5 (*)    Platelets 460 (*)    Monocytes Absolute 1.9 (*)    All other components within normal limits  RETICULOCYTES - Abnormal; Notable for the following components:   Retic Ct Pct 9.1 (*)    RBC. 2.51 (*)    Retic Count, Absolute 227.9 (*)    All other components within normal limits  HCG, QUANTITATIVE, PREGNANCY  I-STAT BETA HCG BLOOD, ED ([facility], [facility], [facility] ONLY)     Procedures  MDM   8:20 AM patient has had 2 doses of subcu pain medication, on reevaluation she reports she is really not feeling any better, will access patient's port to collect lab work and give 1 additional dose of pain  medication through her port and then will reevaluate at that point if patient's pain is still not improving she may require admission.  She remains hemodynamically stable and overall well-appearing despite severe pain.  Lab work shows mild leukocytosis of 12.6, hemoglobin slightly below baseline at 7.6, appropriate reticulocyte count, no significant electrolyte derangements, T bili of 4.1.  Reassessed patient after additional pain medication, she still feels that her pain is severe and she cannot manage this with her home medications, feels like she needs to be admitted.  We will consult sickle cell service for admission.  Case discussed with Dr. Jonelle Sidle with sickle cell service who reviewed patient's chart, feel she can likely go home but encourages her to return to the sickle cell center tomorrow if symptoms or not improving.  I discussed this option with the patient and she is in agreement with this.  Will discharge home today where she will continue to use her home pain medications, and will return to the sickle cell clinic first thing tomorrow morning if she is still having severe pain.    Jacqlyn Larsen, PA-C 06/04/20 1336    Merryl Hacker, MD 06/05/20 [phone]

## 2020-06-04 NOTE — ED Provider Notes (Addendum)
[facility] DEPT Provider Note   CSN: [id number] Arrival date & time: 06/04/20  0124     History Chief Complaint  Patient presents with  . Sickle Cell Pain Crisis    Molly Gutierrez is a 39 y.o. female.  Patient with history of sickle cell disease presents with upper back and chest pain x 3 days typical of sickle cell crises. No cough, SOB, fever. No nausea or vomiting. She has received her COVID vaccinations. On chart review, her last emergency department visit was in [place] 2 days ago. She has been seen at multiple locations including [place], [place] and [place]. She reports she ran out of her regular pain medication (4 mg Dilaudid) yesterday.   The history is provided by the patient. No language interpreter was used.  Sickle Cell Pain Crisis Associated symptoms: chest pain   Associated symptoms: no cough, no fever, no headaches and no shortness of breath        Past Medical History:  Diagnosis Date  . Asthma   . Eczema   . History of pulmonary embolus (PE)   . Sickle cell anemia [facility])     Patient Active Problem List   Diagnosis Date Noted  . 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 Sickle cell anemia with pain ([place]) 04/24/2020  . Mild intermittent asthma 03/31/2020  . Sickle cell anemia with crisis ([place]) 01/07/2020  . Sickle cell crisis acute chest syndrome ([place]) 07/29/2019  . Drug-seeking behavior 07/07/2019  . Sinus tachycardia 07/07/2019  . Therapeutic opioid-induced constipation (OIC) 07/07/2019  . Breast mass in female 06/29/2019  . Atelectasis of right lung 06/29/2019  . Sickle cell crisis ([place]) 06/27/2019  . Sickle cell pain crisis ([place]) 04/19/2019  . Pneumonia 03/27/2019  . 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 Anterior chest wall pain 11/13/2018  . Epigastric abdominal pain 11/13/2018  . Hematuria 11/13/2018  . Hyperbilirubinemia 11/12/2018  . Long term current use of anticoagulant therapy 11/12/2018  . History  of pulmonary embolism 09/26/2018  . Acute pulmonary embolism ([place]) 09/26/2018  . Opioid dependence ([place]) 09/13/2018  . Port-A-Cath in place 09/13/2018  . Narcotic abuse, continuous ([place]) 08/28/2018  . Anemia 07/03/2018  . Personal history of other venous thrombosis and embolism 07/03/2018  . History of transfusion 07/03/2018  . Gastro-esophageal reflux disease without esophagitis 06/02/2018  . Asthma 05/19/2018  . Anxiety and depression 10/30/2017  . At risk for sepsis 06/13/2017  . Mitral regurgitation 02/01/2014  . Itching 12/13/2013  . Nausea & vomiting 12/13/2013  . 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 Frequent complaints of pain 11/01/2013  . Hypokalemia 09/19/2013  . S/P total hip arthroplasty 05/19/2013  . 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 Lower urinary tract infectious disease 03/02/2013  . Chest pain 11/14/2012  . Sickle-cell anemia ([place]) 10/30/2012  . Pain management 08/01/2012  . Reticulocytosis 07/31/2012  . 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 Leukocytosis 03/27/2012  . History of gestational diabetes 10/07/2011  . Hb-SS disease with crisis, unspecified ([place]) 06/25/2010    Past Surgical History:  Procedure Laterality Date  . CHOLECYSTECTOMY    . ERCP    . JOINT REPLACEMENT    . PORTA CATH INSERTION    . TUBAL LIGATION    . WISDOM TOOTH EXTRACTION       OB History   No obstetric history on file.     Family History  Problem Relation Age of Onset  . Renal Disease Mother   . Hypertension Mother   . High Cholesterol Mother     Social History   Tobacco Use  . Smoking status: Never Smoker  . Smokeless tobacco: Never Used  Vaping Use  . Vaping Use: Never used  Substance Use Topics  . Alcohol use: Never  . Drug use: Never    Home Medications Prior to Admission medications    Medication Sig Start Date End Date Taking? Authorizing Provider  albuterol (VENTOLIN HFA) 108 (90 Base) MCG/ACT inhaler Inhale 2 puffs into the lungs 2 (two) times daily as needed for wheezing or shortness of breath. 04/13/20 04/13/21  Vevelyn Francois, NP  budesonide-formoterol (SYMBICORT) 80-4.5 MCG/ACT inhaler Inhale 2 puffs into the lungs 2 (two) times daily. 04/17/20   Vevelyn Francois, NP  celecoxib (CELEBREX) 200 MG capsule Take 1 capsule (200 mg total) by mouth daily. 04/13/20 04/13/21  Vevelyn Francois, NP  Cholecalciferol (VITAMIN D3) 25 MCG (1000 UT) CAPS Take 1 capsule (1,000 Units total) by mouth daily. 04/13/20 04/13/21  Vevelyn Francois, NP  Deferasirox (JADENU) 360 MG TABS Take 3 tablets (1,080 mg total) by mouth daily before breakfast. 04/13/20 04/13/21  Vevelyn Francois, NP  diphenhydrAMINE (BENADRYL) 25 mg capsule Take 25 mg by mouth 3 (three) times daily as needed for itching.    [provider]  folic acid (FOLVITE) 1 MG tablet Take 1 tablet (1 mg total) by mouth daily. 04/13/20 04/13/21  Vevelyn Francois, NP  gabapentin (NEURONTIN) 300 MG capsule Take 1 capsule (300 mg total) by mouth 3 (three) times daily. 04/13/20 04/13/21  Vevelyn Francois, NP  HYDROmorphone (DILAUDID) 4 MG tablet Take 1 tablet (4 mg total) by mouth every 6 (six) hours as needed for up to 15 days for moderate pain or severe pain. 05/24/20 06/08/20  Vevelyn Francois, NP  mirtazapine (REMERON) 45 MG tablet Take 1 tablet (45 mg total) by mouth at bedtime. 04/13/20 04/13/21  Vevelyn Francois, NP  mometasone-formoterol (DULERA) 100-5 MCG/ACT AERO Inhale 2 puffs into the lungs daily as needed for wheezing or shortness of breath. 04/13/20 04/13/21  Vevelyn Francois, NP  omeprazole (PRILOSEC) 20 MG capsule Take 1 capsule (20 mg total) by mouth daily. 04/13/20 04/13/21  Vevelyn Francois, NP  promethazine (PHENERGAN) 25 MG tablet Take 0.5-1 tablets (12.5-25 mg total) by mouth every 6 (six) hours as needed for nausea or  vomiting. 04/13/20 04/13/21  Vevelyn Francois, NP  vitamin B-12 (CYANOCOBALAMIN) 1000 MCG tablet Take 1 tablet (1,000 mcg total) by mouth daily. 04/13/20 04/13/21  Vevelyn Francois, NP  XARELTO 20 MG TABS tablet Take 1 tablet (20 mg total) by mouth daily with supper. 04/13/20 04/13/21  Vevelyn Francois, NP    Allergies    Cefaclor, Hydroxyurea, and Ketamine  Review of Systems   Review of Systems  Constitutional: Negative for chills and fever.  HENT: Negative.   Eyes: Negative for discharge and visual disturbance.  Respiratory: Negative for cough and shortness of breath.   Cardiovascular: Positive for chest pain.  Gastrointestinal: Negative.   Musculoskeletal: Positive for back pain.  Skin: Negative.   Neurological: Negative.  Negative for headaches.  Psychiatric/Behavioral: Negative for confusion.    Physical Exam Updated Vital Signs BP 90/63 (BP Location: Left Arm)   Pulse 84   Temp 98.9 F (37.2 C) (Oral)   Resp 16   Ht 5\' 3"  (1.6 m)   Wt 59  kg   SpO2 98%   BMI 23.03 kg/m   Physical Exam Vitals and nursing note reviewed.  Constitutional:      General: She is not in acute distress.    Appearance: Normal appearance. She is well-developed, normal weight and well-nourished. She is not ill-appearing, toxic-appearing or diaphoretic.  HENT:     Head: Normocephalic.  Eyes:     Conjunctiva/sclera: Conjunctivae normal.  Cardiovascular:     Rate and Rhythm: Normal rate and regular rhythm.     Heart sounds: No murmur heard.   Pulmonary:     Effort: Pulmonary effort is normal.     Breath sounds: Normal breath sounds. No wheezing, rhonchi or rales.  Abdominal:     General: Bowel sounds are normal.     Palpations: Abdomen is soft.     Tenderness: There is no abdominal tenderness. There is no guarding or rebound.  Musculoskeletal:        General: Normal range of motion.     Cervical back: Normal range of motion and neck supple.  Skin:    General: Skin is warm and dry.      Findings: No rash.  Neurological:     Mental Status: She is alert and oriented to person, place, and time.  Psychiatric:        Mood and Affect: Mood and affect normal.     ED Results / Procedures / Treatments   Labs (all labs ordered are listed, but only abnormal results are displayed) Labs Reviewed  COMPREHENSIVE METABOLIC PANEL  CBC WITH DIFFERENTIAL/PLATELET  RETICULOCYTES  HCG, QUANTITATIVE, PREGNANCY  I-STAT BETA HCG BLOOD, ED ([facility], [facility], [facility] ONLY)    EKG None  Radiology DG Chest 2 View  Result Date: 06/04/2020 CLINICAL DATA:  Chest pain.  Sickle cell disease. EXAM: CHEST - 2 VIEW COMPARISON:  None. FINDINGS: The heart size and mediastinal contours are within normal limits. Both lungs are clear. The visualized skeletal structures are unremarkable. There are bilateral chest wall power-injectable Port-A-Caths with tips in central location. IMPRESSION: No active cardiopulmonary disease. Electronically Signed   By: Ulyses Jarred M.D.   On: 06/04/2020 02:37    Procedures Procedures (including critical care time)  Medications Ordered in ED Medications  HYDROmorphone (DILAUDID) injection 2 mg (has no administration in time range)  HYDROmorphone (DILAUDID) injection 2 mg (has no administration in time range)  diphenhydrAMINE (BENADRYL) capsule 25-50 mg (has no administration in time range)  ketorolac (TORADOL) 30 MG/ML injection 30 mg (has no administration in time range)    ED Course  I have reviewed the triage vital signs and the nursing notes.  Pertinent labs & imaging results that were available during my care of the patient were reviewed by me and considered in my medical decision making (see chart for details).    MDM Rules/Calculators/A&P                          Patient to ED with c/o sickle cell pain in upper back and chest - typical. No fever, cough, SOB. She reports x 3 days.   Labs pending. Chart reviewed. Last ER visit 2 days ago, here for 3 days of pain.  Requesting port access for IV administration of pain medication. This was discussed with the patient, specifically that with most recent visit 2 days ago, that pain medications would be given SQ/IM rather than another port access. The patient reluctantly agreed.   Patient care signed out to  Benedetto Goad, PA-C, pending re-evaluation after protocol 2 doses of SQ dilaudid and toradol. Benadryl PO ordered prn. VSS.  ADDENDUM: RN states the patient did not want a peripheral stick to obtain labs. Chart review shows labs done 12/17. At that time, hgb 8.5 which is baseline for her; leukocytosis of 13, improved from value of 16.2 on 12/9; troponin neg x 2; normal renal function; total bili 3.7 (similar to previous).   Additional labs tonight postponed for now. If she requires admission, will consider port access and required lab draw, COVID testing.   Final Clinical Impression(s) / ED Diagnoses Final diagnoses:  None   1. Sickle cell disease with pain  Rx / DC Orders ED Discharge Orders    None       Charlann Lange, PA-C 06/04/20 0651    Charlann Lange, PA-C 06/04/20 2091    Dina Rich, Barbette Hair, MD 06/05/20 [phone]

## 2020-06-04 NOTE — ED Notes (Signed)
Pt has been medicated per MD orders. Pt is unhappy with the fact that her port  Won't be accessed and the IM & PO meds. Provider made aware.

## 2020-06-04 NOTE — ED Notes (Signed)
Initial contact with pt. Pt is watching tv at bedside. Upon starting IV on pt, pt states she wants her port accessed. Provider made aware. Per PA, she will need to see before an order to access port. Pt told and given the option of butterfly but pt refused. Provider made aware.

## 2020-06-04 NOTE — ED Triage Notes (Signed)
Patient states sickle cell crisis x3 days. Patient states last dose of hydromorphone 4 mg yesterday. Patient states pain in chest and back that is typical for her pain. Hx of PE.

## 2020-06-04 NOTE — ED Notes (Signed)
Unable to obtain labs d/t difficult stick provider made aware. Per provider, labs not needed because pt was seen a few days ago.

## 2020-06-05 ENCOUNTER — Telehealth (HOSPITAL_COMMUNITY): Payer: Self-pay | Admitting: General Practice

## 2020-06-05 NOTE — Telephone Encounter (Signed)
Patient called, requesting to come to the day hospital due to pain in the  back rated at 8/10. Denied chest pain, fever, diarrhea, abdominal pain, nausea/vomitting. Screened negative for Covid-19 symptoms. Admitted to having means of transportation without driving self after treatment. Last took 4 mg of Dilaudid at about 00:30 am today. Was at the ER on 06/04/20. Patient believes she is going to need an admission. Per provider, should go to the ER for treatment with the possibility of an admission. Patient notified, verbalized understanding.

## 2020-06-06 ENCOUNTER — Telehealth: Payer: Self-pay | Admitting: Nurse Practitioner

## 2020-06-07 ENCOUNTER — Other Ambulatory Visit: Payer: Self-pay | Admitting: Nurse Practitioner

## 2020-06-07 MED ORDER — HYDROMORPHONE HCL 4 MG PO TABS: 4.0000 mg | ORAL_TABLET | Freq: Four times a day (QID) | ORAL | 0 refills | Status: DC | PRN

## 2020-06-07 NOTE — Telephone Encounter (Signed)
sent 

## 2020-06-09 IMAGING — DX DG CHEST 1V PORT
1 series · 1 of 1 positions shown · non-contrast
Comparison: Chest radiograph dated 08/08/2019.

CLINICAL DATA: 38-year-old female with chest pain and sickle cell
crisis.

EXAM:
PORTABLE CHEST 1 VIEW

[chest ap]
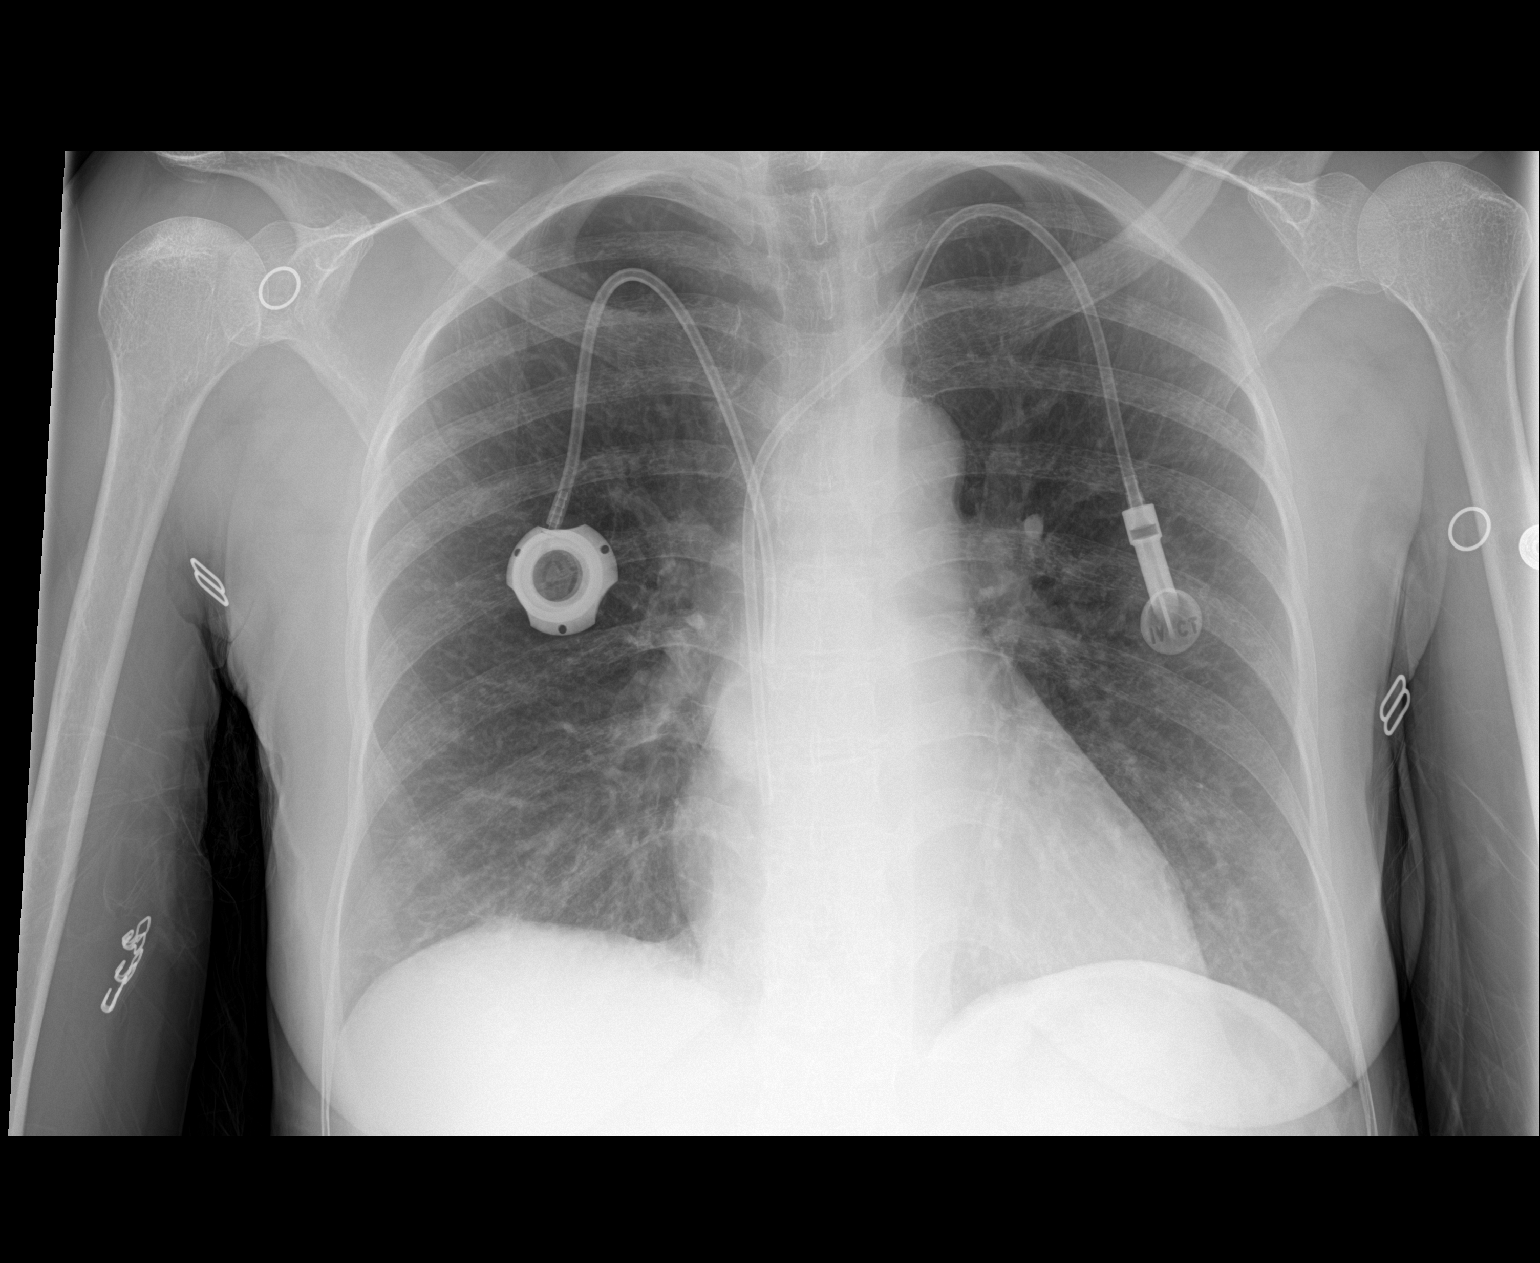

[1 of 1 positions shown; findings below may reference images not displayed]

FINDINGS: Bilateral infusion ports appear in similar position. No focal
consolidation, pleural effusion, or pneumothorax. The cardiac
silhouette is within normal limits. No acute osseous pathology.
IMPRESSION: No acute cardiopulmonary process.

## 2020-06-23 ENCOUNTER — Telehealth: Payer: Self-pay | Admitting: Nurse Practitioner

## 2020-06-24 ENCOUNTER — Other Ambulatory Visit: Payer: Self-pay | Admitting: Nurse Practitioner

## 2020-06-24 MED ORDER — HYDROMORPHONE HCL 4 MG PO TABS: 4.0000 mg | ORAL_TABLET | Freq: Four times a day (QID) | ORAL | 0 refills | Status: DC | PRN

## 2020-06-24 NOTE — Progress Notes (Signed)
   Molly Gutierrez, [place]  [address] Phone:  [phone]   Fax:  [phone] PDMP not reviewed this encounter.Subjective:    [facility] calls for refill of her hydromorphone.    Objective:   Indication for chronic opioid: Chronic pain related to sickle cell disease  Medication and dose: Hydromorphone 4 mg every 6 hours # pills per month: 120 Last UDS date: 05/24/2020 Opioid Treatment Agreement signed (Y/N): Yes Opioid Treatment Agreement last reviewed with patient:  2021 [place] reviewed this encounter (include red flags):  Reviewed   Assessment:   Chronic Pain Syndrome related to Sickle Cell Disease    Plan:   Hydromorphone 4 mg #60 refill Return for follow-up appointment as scheduled.

## 2020-06-26 NOTE — Telephone Encounter (Signed)
Done

## 2020-07-04 ENCOUNTER — Telehealth: Payer: Self-pay | Admitting: Nurse Practitioner

## 2020-07-05 ENCOUNTER — Other Ambulatory Visit: Payer: Self-pay | Admitting: Nurse Practitioner

## 2020-07-05 MED ORDER — HYDROMORPHONE HCL 4 MG PO TABS: 4.0000 mg | ORAL_TABLET | Freq: Four times a day (QID) | ORAL | 0 refills | Status: DC | PRN

## 2020-07-05 NOTE — Telephone Encounter (Signed)
Refill sent.

## 2020-07-21 ENCOUNTER — Telehealth: Payer: Self-pay | Admitting: Nurse Practitioner

## 2020-07-21 ENCOUNTER — Other Ambulatory Visit: Payer: Self-pay | Admitting: Nurse Practitioner

## 2020-07-21 MED ORDER — HYDROMORPHONE HCL 4 MG PO TABS: 4.0000 mg | ORAL_TABLET | Freq: Four times a day (QID) | ORAL | 0 refills | Status: DC | PRN

## 2020-07-21 NOTE — Telephone Encounter (Signed)
Sent!

## 2020-07-26 ENCOUNTER — Ambulatory Visit: Payer: Medicare Other | Admitting: Nurse Practitioner

## 2020-07-28 ENCOUNTER — Ambulatory Visit: Payer: Medicare Other | Admitting: Nurse Practitioner

## 2020-08-02 ENCOUNTER — Other Ambulatory Visit: Payer: Self-pay

## 2020-08-02 ENCOUNTER — Encounter: Payer: Self-pay | Admitting: Nurse Practitioner

## 2020-08-02 ENCOUNTER — Ambulatory Visit (INDEPENDENT_AMBULATORY_CARE_PROVIDER_SITE_OTHER): Payer: Medicare Other | Admitting: Nurse Practitioner

## 2020-08-02 ENCOUNTER — Non-Acute Institutional Stay (HOSPITAL_COMMUNITY)
Admission: RE | Admit: 2020-08-02 | Discharge: 2020-08-02 | Disposition: A | Discharge status: Home / Self Care | Payer: Medicare Other | Source: Ambulatory Visit | Attending: Internal Medicine | Admitting: Internal Medicine

## 2020-08-02 VITALS — BP 113/72 | HR 82 | Temp 98.4°F | Ht 63.0 in | Wt 130.0 lb

## 2020-08-02 DIAGNOSIS — D571 Sickle-cell disease without crisis: Secondary | ICD-10-CM

## 2020-08-02 DIAGNOSIS — G894 Chronic pain syndrome: Secondary | ICD-10-CM | POA: Diagnosis not present

## 2020-08-02 DIAGNOSIS — Z7901 Long term (current) use of anticoagulants: Secondary | ICD-10-CM | POA: Diagnosis not present

## 2020-08-02 MED ORDER — CELECOXIB 200 MG PO CAPS: 200.0000 mg | ORAL_CAPSULE | Freq: Every day | ORAL | 3 refills | Status: DC

## 2020-08-02 MED ORDER — SODIUM CHLORIDE 0.9% FLUSH
10.0000 mL | INTRAVENOUS | Status: AC | PRN
  Administered 2020-08-02: 10 mL

## 2020-08-02 MED ORDER — HYDROMORPHONE HCL 4 MG PO TABS: 4.0000 mg | ORAL_TABLET | Freq: Four times a day (QID) | ORAL | 0 refills | Status: DC | PRN

## 2020-08-02 MED ORDER — OMEPRAZOLE 20 MG PO CPDR: 20.0000 mg | DELAYED_RELEASE_CAPSULE | Freq: Two times a day (BID) | ORAL | 3 refills | Status: DC

## 2020-08-02 MED ORDER — HEPARIN SOD (PORK) LOCK FLUSH 100 UNIT/ML IV SOLN
500.0000 [IU] | INTRAVENOUS | Status: AC | PRN
  Administered 2020-08-02: 500 [IU]
  Filled 2020-08-02: qty 5

## 2020-08-02 MED ORDER — PROMETHAZINE HCL 25 MG PO TABS: 12.5000 mg | ORAL_TABLET | Freq: Four times a day (QID) | ORAL | 11 refills | Status: DC | PRN

## 2020-08-02 NOTE — Patient Instructions (Signed)
Health Maintenance, Female Adopting a healthy lifestyle and getting preventive care are important in promoting health and wellness. Ask your health care provider about:  The right schedule for you to have regular tests and exams.  Things you can do on your own to prevent diseases and keep yourself healthy. What should I know about diet, weight, and exercise? Eat a healthy diet  Eat a diet that includes plenty of vegetables, fruits, low-fat dairy products, and lean protein.  Do not eat a lot of foods that are high in solid fats, added sugars, or sodium.   Maintain a healthy weight Body mass index (BMI) is used to identify weight problems. It estimates body fat based on height and weight. Your health care provider can help determine your BMI and help you achieve or maintain a healthy weight. Get regular exercise Get regular exercise. This is one of the most important things you can do for your health. Most adults should:  Exercise for at least 150 minutes each week. The exercise should increase your heart rate and make you sweat (moderate-intensity exercise).  Do strengthening exercises at least twice a week. This is in addition to the moderate-intensity exercise.  Spend less time sitting. Even light physical activity can be beneficial. Watch cholesterol and blood lipids Have your blood tested for lipids and cholesterol at 40 years of age, then have this test every 5 years. Have your cholesterol levels checked more often if:  Your lipid or cholesterol levels are high.  You are older than 40 years of age.  You are at high risk for heart disease. What should I know about cancer screening? Depending on your health history and family history, you may need to have cancer screening at various ages. This may include screening for:  Breast cancer.  Cervical cancer.  Colorectal cancer.  Skin cancer.  Lung cancer. What should I know about heart disease, diabetes, and high blood  pressure? Blood pressure and heart disease  High blood pressure causes heart disease and increases the risk of stroke. This is more likely to develop in people who have high blood pressure readings, are of African descent, or are overweight.  Have your blood pressure checked: ? Every 3-5 years if you are 18-39 years of age. ? Every year if you are 40 years old or older. Diabetes Have regular diabetes screenings. This checks your fasting blood sugar level. Have the screening done:  Once every three years after age 40 if you are at a normal weight and have a low risk for diabetes.  More often and at a younger age if you are overweight or have a high risk for diabetes. What should I know about preventing infection? Hepatitis B If you have a higher risk for hepatitis B, you should be screened for this virus. Talk with your health care provider to find out if you are at risk for hepatitis B infection. Hepatitis C Testing is recommended for:  Everyone born from 1945 through 1965.  Anyone with known risk factors for hepatitis C. Sexually transmitted infections (STIs)  Get screened for STIs, including gonorrhea and chlamydia, if: ? You are sexually active and are younger than 40 years of age. ? You are older than 40 years of age and your health care provider tells you that you are at risk for this type of infection. ? Your sexual activity has changed since you were last screened, and you are at increased risk for chlamydia or gonorrhea. Ask your health care provider   if you are at risk.  Ask your health care provider about whether you are at high risk for HIV. Your health care provider may recommend a prescription medicine to help prevent HIV infection. If you choose to take medicine to prevent HIV, you should first get tested for HIV. You should then be tested every 3 months for as long as you are taking the medicine. Pregnancy  If you are about to stop having your period (premenopausal) and  you may become pregnant, seek counseling before you get pregnant.  Take 400 to 800 micrograms (mcg) of folic acid every day if you become pregnant.  Ask for birth control (contraception) if you want to prevent pregnancy. Osteoporosis and menopause Osteoporosis is a disease in which the bones lose minerals and strength with aging. This can result in bone fractures. If you are 65 years old or older, or if you are at risk for osteoporosis and fractures, ask your health care provider if you should:  Be screened for bone loss.  Take a calcium or vitamin D supplement to lower your risk of fractures.  Be given hormone replacement therapy (HRT) to treat symptoms of menopause. Follow these instructions at home: Lifestyle  Do not use any products that contain nicotine or tobacco, such as cigarettes, e-cigarettes, and chewing tobacco. If you need help quitting, ask your health care provider.  Do not use street drugs.  Do not share needles.  Ask your health care provider for help if you need support or information about quitting drugs. Alcohol use  Do not drink alcohol if: ? Your health care provider tells you not to drink. ? You are pregnant, may be pregnant, or are planning to become pregnant.  If you drink alcohol: ? Limit how much you use to 0-1 drink a day. ? Limit intake if you are breastfeeding.  Be aware of how much alcohol is in your drink. In the [place] one drink equals one 12 oz bottle of beer (355 mL), one 5 oz glass of wine (148 mL), or one 1 oz glass of hard liquor (44 mL). General instructions  Schedule regular health, dental, and eye exams.  Stay current with your vaccines.  Tell your health care provider if: ? You often feel depressed. ? You have ever been abused or do not feel safe at home. Summary  Adopting a healthy lifestyle and getting preventive care are important in promoting health and wellness.  Follow your health care provider's instructions about healthy  diet, exercising, and getting tested or screened for diseases.  Follow your health care provider's instructions on monitoring your cholesterol and blood pressure. This information is not intended to replace advice given to you by your health care provider. Make sure you discuss any questions you have with your health care provider. Document Revised: 05/27/2018 Document Reviewed: 05/27/2018 Elsevier Patient Education  2021 [employer]  

## 2020-08-02 NOTE — Progress Notes (Signed)
[place], [place]  [address] Phone:  [phone]   Fax:  [phone]    Established Patient Office Visit  Subjective:  Patient ID: Molly Gutierrez, female    DOB: 1980/08/09  Age: 40 y.o. MRN: [id number]  CC:  Chief Complaint  Patient presents with  . Follow-up    2 mionth follow mild pain     HPI [facility] presents for follow up. She  has a past medical history of Asthma, Eczema, History of pulmonary embolus (PE), and Sickle cell anemia ([place]).   She is complaining 4/ 10 shoulder pain. She admits that this is a nagging pain. She denies any Denies fever, headache, cough, wheezing, shortness of breath, chest pains, abdominal pain, hip pain, or leg pain. Denies any open wounds, skin irritation. She admits that she has 3 episodes of pain that she treats at home per week. She has tried to receive care on several occasions within the day hospital. She has had a few ED visit in her local hospital. She is tolerating her medication without difficulty.     Past Medical History:  Diagnosis Date  . Asthma   . Eczema   . History of pulmonary embolus (PE)   . Sickle cell anemia (HCC)     Past Surgical History:  Procedure Laterality Date  . CHOLECYSTECTOMY    . ERCP    . JOINT REPLACEMENT    . PORTA CATH INSERTION    . TUBAL LIGATION    . WISDOM TOOTH EXTRACTION      Family History  Problem Relation Age of Onset  . Renal Disease Mother   . Hypertension Mother   . High Cholesterol Mother     Social History   Socioeconomic History  . Marital status: Single    Spouse name: Not on file  . Number of children: Not on file  . Years of education: Not on file  . Highest education level: Not on file  Occupational History  . Not on file  Tobacco Use  . Smoking status: Never Smoker  . Smokeless tobacco: Never Used  Vaping Use  . Vaping Use: Never used  Substance and Sexual Activity  . Alcohol use: Never  . Drug  use: Never  . Sexual activity: Not on file  Other Topics Concern  . Not on file  Social History Narrative  . Not on file   Social Determinants of Health   Financial Resource Strain: Not on file  Food Insecurity: Not on file  Transportation Needs: Not on file  Physical Activity: Not on file  Stress: Not on file  Social Connections: Not on file  Intimate Partner Violence: Not on file    Outpatient Medications Prior to Visit  Medication Sig Dispense Refill  . albuterol (VENTOLIN HFA) 108 (90 Base) MCG/ACT inhaler Inhale 2 puffs into the lungs 2 (two) times daily as needed for wheezing or shortness of breath. 8 g 11  . budesonide-formoterol (SYMBICORT) 80-4.5 MCG/ACT inhaler Inhale 2 puffs into the lungs 2 (two) times daily. 1 each 3  . Cholecalciferol (VITAMIN D3) 25 MCG (1000 UT) CAPS Take 1 capsule (1,000 Units total) by mouth daily. 90 capsule 3  . diphenhydrAMINE (BENADRYL) 25 mg capsule Take 25 mg by mouth 3 (three) times daily as needed for itching.    . folic acid (FOLVITE) 1 MG tablet Take 1 tablet (1 mg total) by mouth daily. 90 tablet 3  . gabapentin (NEURONTIN) 300 MG  capsule Take 1 capsule (300 mg total) by mouth 3 (three) times daily. 270 capsule 3  . mirtazapine (REMERON) 45 MG tablet Take 1 tablet (45 mg total) by mouth at bedtime. 90 tablet 3  . mometasone-formoterol (DULERA) 100-5 MCG/ACT AERO Inhale 2 puffs into the lungs daily as needed for wheezing or shortness of breath. 1 each 5  . polyethylene glycol (MIRALAX / GLYCOLAX) 17 g packet Take 17 g by mouth daily as needed for mild constipation.    . vitamin B-12 (CYANOCOBALAMIN) 1000 MCG tablet Take 1 tablet (1,000 mcg total) by mouth daily. 90 tablet 3  . XARELTO 20 MG TABS tablet Take 1 tablet (20 mg total) by mouth daily with supper. 90 tablet 3  . celecoxib (CELEBREX) 200 MG capsule Take 1 capsule (200 mg total) by mouth daily. 90 capsule 3  . HYDROmorphone (DILAUDID) 4 MG tablet Take 1 tablet (4 mg total) by mouth  every 6 (six) hours as needed for up to 15 days for moderate pain or severe pain. 60 tablet 0  . omeprazole (PRILOSEC) 20 MG capsule Take 1 capsule (20 mg total) by mouth daily. (Patient taking differently: Take 20 mg by mouth 2 (two) times daily before a meal.) 90 capsule 3  . promethazine (PHENERGAN) 25 MG tablet Take 0.5-1 tablets (12.5-25 mg total) by mouth every 6 (six) hours as needed for nausea or vomiting. 30 tablet 11  . Deferasirox (JADENU) 360 MG TABS Take 3 tablets (1,080 mg total) by mouth daily before breakfast. 90 tablet 11   No facility-administered medications prior to visit.    Allergies  Allergen Reactions  . Cefaclor Hives and Swelling  . Hydroxyurea Palpitations and Other (See Comments)    Lowers "blood levels" and heart rate (causes HYPOtension); "it messes me up, it drops my levels and stuff"  Other reaction(s): Hypotension, Other (See Comments) "it messes me up, it drops my levels and stuff" Lower blood levels and HR  Other reaction(s): Other (See Comments) Lowers "blood levels" and heart rate (causes HYPOtension); "it messes me up, it drops my levels and stuff"  . Ketamine Palpitations and Other (See Comments)    "Pt states she has had previous reaction to ketamine. States she becomes flushed, heart races, dizzy, and feels like she is going to pass out." Other reaction(s): Other (See Comments), Other (See Comments) "Pt states she has had previous reaction to ketamine. States she becomes flushed, heart races, dizzy, and feels like she is going to pass out." "Pt states she has had previous reaction to ketamine. States she becomes flushed, heart races, dizzy, and feels like she is going to pass out." Other reaction(s): Other (See Comments) tachycardia    ROS Review of Systems  HENT: Negative.   Eyes: Negative.   Respiratory: Positive for chest tightness (this is mild ). Negative for shortness of breath.   Cardiovascular: Negative for chest pain.   Gastrointestinal: Positive for nausea (during crisis). Negative for abdominal pain, constipation and diarrhea.  Endocrine: Negative.   Genitourinary: Negative.   Allergic/Immunologic: Negative.   Neurological: Negative.       Objective:    Physical Exam HENT:     Head: Normocephalic and atraumatic.     Nose: Nose normal.     Mouth/Throat:     Mouth: Mucous membranes are moist.  Cardiovascular:     Rate and Rhythm: Normal rate and regular rhythm.     Pulses: Normal pulses.     Heart sounds: Normal heart sounds.  Pulmonary:  Effort: Pulmonary effort is normal.     Breath sounds: Normal breath sounds.  Musculoskeletal:        General: Normal range of motion.     Cervical back: Normal range of motion.  Skin:    General: Skin is warm.     Capillary Refill: Capillary refill takes less than 2 seconds.  Neurological:     General: No focal deficit present.     Mental Status: She is alert and oriented to person, place, and time.  Psychiatric:        Mood and Affect: Mood normal.        Behavior: Behavior normal.        Thought Content: Thought content normal.        Judgment: Judgment normal.     BP 113/72   Pulse 82   Temp 98.4 F (36.9 C) (Temporal)   Ht 5\' 3"  (1.6 m)   Wt 130 lb (59 kg)   LMP 07/26/2020   BMI 23.03 kg/m  Wt Readings from Last 3 Encounters:  08/02/20 130 lb (59 kg)  06/04/20 130 lb (59 kg)  05/25/20 130 lb (59 kg)     There are no preventive care reminders to display for this patient.  There are no preventive care reminders to display for this patient.  No results found for: TSH Lab Results  Component Value Date   WBC 12.6 (H) 06/04/2020   HGB 7.6 (L) 06/04/2020   HCT 22.6 (L) 06/04/2020   MCV 89.0 06/04/2020   PLT 460 (H) 06/04/2020   Lab Results  Component Value Date   NA 138 06/04/2020   K 3.9 06/04/2020   CO2 23 06/04/2020   GLUCOSE 100 (H) 06/04/2020   BUN 8 06/04/2020   CREATININE 0.76 06/04/2020   BILITOT 4.1 (H)  06/04/2020   ALKPHOS 57 06/04/2020   AST 34 06/04/2020   ALT 28 06/04/2020   PROT 7.7 06/04/2020   ALBUMIN 4.2 06/04/2020   CALCIUM 8.9 06/04/2020   ANIONGAP 8 06/04/2020   No results found for: CHOL No results found for: HDL No results found for: LDLCALC No results found for: TRIG No results found for: CHOLHDL No results found for: HGBA1C    Assessment & Plan:   Problem List Items Addressed This Visit   None   Visit Diagnoses    Hb-SS disease without crisis ([place])    -  Primary Ensure adequate hydration. Move frequently to reduce venous thromboembolism risk. Avoid situations that could lead to dehydration or could exacerbate pain Discussed S&S of infection, seizures, stroke acute chest, DVT and how important it is to seek medical attention Take medication as directed along with pain contract and overall compliance Discussed the risk related to opiate use (addition, tolerance and dependency)     Relevant Orders   Sickle Cell Panel   Chronic pain syndrome       Relevant Medications   celecoxib (CELEBREX) 200 MG capsule   Anticoagulant long-term use    Discussed precautions       Meds ordered this encounter  Medications  . celecoxib (CELEBREX) 200 MG capsule    Sig: Take 1 capsule (200 mg total) by mouth daily.    Dispense:  90 capsule    Refill:  3    Order Specific Question:   Supervising Provider    Answer:   [name] [id number]  . promethazine (PHENERGAN) 25 MG tablet    Sig: Take 0.5-1 tablets (12.5-25 mg total) by  mouth every 6 (six) hours as needed for nausea or vomiting.    Dispense:  30 tablet    Refill:  11    Order Specific Question:   Supervising Provider    Answer:   [name] [id number]  . omeprazole (PRILOSEC) 20 MG capsule    Sig: Take 1 capsule (20 mg total) by mouth 2 (two) times daily before a meal.    Dispense:  180 capsule    Refill:  3    Order Specific Question:   Supervising Provider    Answer:   [name] [id number]  . HYDROmorphone (DILAUDID) 4 MG tablet    Sig: Take 1 tablet (4 mg total) by mouth every 6 (six) hours as needed for up to 15 days for moderate pain or severe pain.    Dispense:  60 tablet    Refill:  0    Refill on 08/08/2020    Order Specific Question:   Supervising Provider    Answer:   [name] [[id number]]    Follow-up: Return in about 2 months (around 09/30/2020) for Follow up [facility] [id number].    Vevelyn Francois, NP

## 2020-08-02 NOTE — Progress Notes (Signed)
  Provider: Dionisio David NP     Procedure: Port-a-cath flush and lab draws (Sickle cell panel and CBC)    Note: Patient's PAC accessed, flushed with 10 cc 0.9% sodium chloride and 500 units of heparin. Blood return noted. PAC de-accessed and site covered with band aid. Patient tolerated well. Patient declined printed AVS. Alert, oriented and ambulatory at discharge.

## 2020-08-03 LAB — CMP14+CBC/D/PLT+FER+RETIC+V...
ALT: 24 IU/L (ref 0–32)
AST: 46 IU/L — ABNORMAL HIGH (ref 0–40)
Albumin/Globulin Ratio: 1.4 (ref 1.2–2.2)
Albumin: 4.3 g/dL (ref 3.8–4.8)
Alkaline Phosphatase: 92 IU/L (ref 44–121)
BUN/Creatinine Ratio: 13 (ref 9–23)
BUN: 7 mg/dL (ref 6–20)
Basophils Absolute: 0.1 10*3/uL (ref 0.0–0.2)
Basos: 0 %
Bilirubin Total: 3.4 mg/dL — ABNORMAL HIGH (ref 0.0–1.2)
CO2: 19 mmol/L — ABNORMAL LOW (ref 20–29)
Calcium: 9 mg/dL (ref 8.7–10.2)
Chloride: 103 mmol/L (ref 96–106)
Creatinine, Ser: 0.56 mg/dL — ABNORMAL LOW (ref 0.57–1.00)
EOS (ABSOLUTE): 0.4 10*3/uL (ref 0.0–0.4)
Eos: 3 %
Ferritin: 3625 ng/mL — ABNORMAL HIGH (ref 15–150)
GFR calc Af Amer: 136 mL/min/{1.73_m2} (ref >59)
GFR calc non Af Amer: 118 mL/min/{1.73_m2} (ref >59)
Globulin, Total: 3.1 g/dL (ref 1.5–4.5)
Glucose: 98 mg/dL (ref 65–99)
Hematocrit: 22.3 % — ABNORMAL LOW (ref 34.0–46.6)
Hemoglobin: 7.8 g/dL — ABNORMAL LOW (ref 11.1–15.9)
Immature Grans (Abs): 0.1 10*3/uL (ref 0.0–0.1)
Immature Granulocytes: 1 %
Lymphocytes Absolute: 3 10*3/uL (ref 0.7–3.1)
Lymphs: 20 %
MCH: 32.2 pg (ref 26.6–33.0)
MCHC: 35 g/dL (ref 31.5–35.7)
MCV: 92 fL (ref 79–97)
Monocytes Absolute: 1.7 10*3/uL — ABNORMAL HIGH (ref 0.1–0.9)
Monocytes: 12 %
NRBC: 3 % — ABNORMAL HIGH (ref 0–0)
Neutrophils Absolute: 9.5 10*3/uL — ABNORMAL HIGH (ref 1.4–7.0)
Neutrophils: 64 %
Platelets: 560 10*3/uL — ABNORMAL HIGH (ref 150–450)
Potassium: 4.2 mmol/L (ref 3.5–5.2)
RBC: 2.42 x10E6/uL — CL (ref 3.77–5.28)
RDW: 23.8 % — ABNORMAL HIGH (ref 11.7–15.4)
Retic Ct Pct: 21.6 % — ABNORMAL HIGH (ref 0.6–2.6)
Sodium: 137 mmol/L (ref 134–144)
Total Protein: 7.4 g/dL (ref 6.0–8.5)
Vit D, 25-Hydroxy: 14.7 ng/mL — ABNORMAL LOW (ref 30.0–100.0)
WBC: 14.9 10*3/uL — ABNORMAL HIGH (ref 3.4–10.8)

## 2020-08-03 LAB — POCT URINALYSIS DIPSTICK
Bilirubin, UA: NEGATIVE
Blood, UA: NEGATIVE
Glucose, UA: NEGATIVE
Ketones, UA: NEGATIVE
Leukocytes, UA: NEGATIVE
Nitrite, UA: POSITIVE
Protein, UA: POSITIVE — AB
Spec Grav, UA: 1.02 (ref 1.010–1.025)
Urobilinogen, UA: 1 E.U./dL
pH, UA: 7 (ref 5.0–8.0)

## 2020-08-03 NOTE — Addendum Note (Signed)
Addended by: Tyrone Apple on: 08/03/2020 02:44 PM   Modules accepted: Orders

## 2020-08-05 ENCOUNTER — Other Ambulatory Visit: Payer: Self-pay

## 2020-08-05 ENCOUNTER — Emergency Department (HOSPITAL_COMMUNITY)
Admission: EM | Admit: 2020-08-05 | Discharge: 2020-08-06 | Disposition: A | Discharge status: Home / Self Care | Payer: Medicare Other | Attending: Emergency Medicine | Admitting: Emergency Medicine

## 2020-08-05 ENCOUNTER — Encounter (HOSPITAL_COMMUNITY): Payer: Self-pay | Admitting: Emergency Medicine

## 2020-08-05 ENCOUNTER — Emergency Department (HOSPITAL_COMMUNITY): Payer: Medicare Other

## 2020-08-05 DIAGNOSIS — Z7901 Long term (current) use of anticoagulants: Secondary | ICD-10-CM | POA: Insufficient documentation

## 2020-08-05 DIAGNOSIS — J45909 Unspecified asthma, uncomplicated: Secondary | ICD-10-CM | POA: Insufficient documentation

## 2020-08-05 DIAGNOSIS — Z7951 Long term (current) use of inhaled steroids: Secondary | ICD-10-CM | POA: Diagnosis not present

## 2020-08-05 DIAGNOSIS — R079 Chest pain, unspecified: Secondary | ICD-10-CM | POA: Diagnosis present

## 2020-08-05 DIAGNOSIS — M545 Low back pain, unspecified: Secondary | ICD-10-CM | POA: Insufficient documentation

## 2020-08-05 DIAGNOSIS — D5700 Hb-SS disease with crisis, unspecified: Secondary | ICD-10-CM | POA: Diagnosis not present

## 2020-08-05 LAB — RETICULOCYTES
Immature Retic Fract: 12.4 % (ref 2.3–15.9)
RBC.: 2.61 MIL/uL — ABNORMAL LOW (ref 3.87–5.11)
Retic Count, Absolute: 497.5 10*3/uL — ABNORMAL HIGH (ref 19.0–186.0)
Retic Ct Pct: 18.8 % — ABNORMAL HIGH (ref 0.4–3.1)

## 2020-08-05 LAB — I-STAT BETA HCG BLOOD, ED (MC, WL, AP ONLY): I-stat hCG, quantitative: <5 m[IU]/mL (ref <5)

## 2020-08-05 MED ORDER — KETOROLAC TROMETHAMINE 15 MG/ML IJ SOLN
15.0000 mg | INTRAMUSCULAR | Status: AC
  Administered 2020-08-05: 15 mg via INTRAVENOUS
  Filled 2020-08-05: qty 1

## 2020-08-05 MED ORDER — DEXTROSE-NACL 5-0.45 % IV SOLN: INTRAVENOUS | Status: DC

## 2020-08-05 MED ORDER — ONDANSETRON HCL 4 MG/2ML IJ SOLN
4.0000 mg | INTRAMUSCULAR | Status: DC | PRN
  Administered 2020-08-05: 4 mg via INTRAVENOUS
  Filled 2020-08-05: qty 2

## 2020-08-05 MED ORDER — DIPHENHYDRAMINE HCL 25 MG PO CAPS
25.0000 mg | ORAL_CAPSULE | Freq: Four times a day (QID) | ORAL | Status: DC | PRN
  Administered 2020-08-05: 25 mg via ORAL
  Administered 2020-08-06: 50 mg via ORAL
  Filled 2020-08-05: qty 1
  Filled 2020-08-05: qty 2

## 2020-08-05 MED ORDER — HYDROMORPHONE HCL 2 MG/ML IJ SOLN
2.0000 mg | INTRAMUSCULAR | Status: AC
  Administered 2020-08-05 – 2020-08-06 (×2): 2 mg via INTRAVENOUS
  Filled 2020-08-05 (×2): qty 1

## 2020-08-05 NOTE — ED Provider Notes (Signed)
[facility] DEPT Provider Note   CSN: [id number] Arrival date & time: 08/05/20  2057     History Chief Complaint  Patient presents with  . Chest Pain    Molly Gutierrez is a 40 y.o. female.  Molly Gutierrez is a 40 y.o. female with a past medical history significant for sickle cell anemia, PE (on Xarelto), asthma, GERD, and mitral regurgitation who presents today for chest and back pain. Patient reports substernal chest pain and left thoracic back pain which started last night. It has remained constant. Patient describes the pain as her typical sickle cell crisis pain and rates the pain 9/10. No fever, SOB, cough, nausea or vomiting, leg swelling or calf pain. Patient reports compliance with Xarelto and denies missing any doses. No recent trauma to chest or back. She states she ran out of her Dilaudid tablets four days ago.   The history is provided by the patient. No language interpreter was used.  Chest Pain      Past Medical History:  Diagnosis Date  . Asthma   . Eczema   . History of pulmonary embolus (PE)   . Sickle cell anemia [facility])     Patient Active Problem List   Diagnosis Date Noted  . 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 Sickle cell anemia with pain ([place]) 04/24/2020  . Mild intermittent asthma 03/31/2020  . Sickle cell anemia with crisis ([place]) 01/07/2020  . Sickle cell crisis acute chest syndrome ([place]) 07/29/2019  . Drug-seeking behavior 07/07/2019  . Sinus tachycardia 07/07/2019  . Therapeutic opioid-induced constipation ([facility]) 07/07/2019  . Breast mass in female 06/29/2019  . Atelectasis of right lung 06/29/2019  . Sickle cell crisis ([place]) 06/27/2019  . Sickle cell pain crisis ([place]) 04/19/2019  . Pneumonia 03/27/2019  . 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 Anterior chest wall pain 11/13/2018  . Epigastric abdominal pain 11/13/2018  . Hematuria 11/13/2018  . Hyperbilirubinemia 11/12/2018  . Long term current use  of anticoagulant therapy 11/12/2018  . History of pulmonary embolism 09/26/2018  . Acute pulmonary embolism ([place]) 09/26/2018  . Opioid dependence ([place]) 09/13/2018  . Port-A-Cath in place 09/13/2018  . Narcotic abuse, continuous ([place]) 08/28/2018  . Anemia 07/03/2018  . Personal history of other venous thrombosis and embolism 07/03/2018  . History of transfusion 07/03/2018  . Gastro-esophageal reflux disease without esophagitis 06/02/2018  . Asthma 05/19/2018  . Anxiety and depression 10/30/2017  . At risk for sepsis 06/13/2017  . Mitral regurgitation 02/01/2014  . Itching 12/13/2013  . Nausea & vomiting 12/13/2013  . 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 Frequent complaints of pain 11/01/2013  . Hypokalemia 09/19/2013  . S/P total hip arthroplasty 05/19/2013  . 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 Lower urinary tract infectious disease 03/02/2013  . Chest pain 11/14/2012  . Sickle-cell anemia ([place]) 10/30/2012  . Pain management 08/01/2012  . Reticulocytosis 07/31/2012  . 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 Leukocytosis 03/27/2012  . History of gestational diabetes 10/07/2011  . Hb-SS disease with crisis, unspecified ([place]) 06/25/2010    Past Surgical History:  Procedure Laterality Date  . CHOLECYSTECTOMY    . ERCP    . JOINT REPLACEMENT    . PORTA CATH INSERTION    . TUBAL LIGATION    . WISDOM TOOTH EXTRACTION       OB History   No obstetric history on file.  Family History  Problem Relation Age of Onset  . Renal Disease Mother   . Hypertension Mother   . High Cholesterol Mother     Social History   Tobacco Use  . Smoking status: Never Smoker  . Smokeless tobacco: Never Used  Vaping Use  . Vaping Use: Never used  Substance Use Topics  . Alcohol use: Never  . Drug use: Never    Home  Medications Prior to Admission medications   Medication Sig Start Date End Date Taking? Authorizing Provider  albuterol (VENTOLIN HFA) 108 (90 Base) MCG/ACT inhaler Inhale 2 puffs into the lungs 2 (two) times daily as needed for wheezing or shortness of breath. 04/13/20 04/13/21 Yes King, Diona Foley, NP  budesonide-formoterol (SYMBICORT) 80-4.5 MCG/ACT inhaler Inhale 2 puffs into the lungs 2 (two) times daily. 04/17/20  Yes Vevelyn Francois, NP  celecoxib (CELEBREX) 200 MG capsule Take 1 capsule (200 mg total) by mouth daily. 08/02/20 08/02/21 Yes Vevelyn Francois, NP  Cholecalciferol (VITAMIN D3) 25 MCG (1000 UT) CAPS Take 1 capsule (1,000 Units total) by mouth daily. 04/13/20 04/13/21 Yes Vevelyn Francois, NP  Deferasirox (JADENU) 360 MG TABS Take 3 tablets (1,080 mg total) by mouth daily before breakfast. 04/13/20 04/13/21 Yes Vevelyn Francois, NP  diphenhydrAMINE (BENADRYL) 25 mg capsule Take 25 mg by mouth 3 (three) times daily as needed for itching.   Yes [provider]  folic acid (FOLVITE) 1 MG tablet Take 1 tablet (1 mg total) by mouth daily. 04/13/20 04/13/21 Yes Vevelyn Francois, NP  gabapentin (NEURONTIN) 300 MG capsule Take 1 capsule (300 mg total) by mouth 3 (three) times daily. 04/13/20 04/13/21 Yes King, Diona Foley, NP  HYDROmorphone (DILAUDID) 4 MG tablet Take 1 tablet (4 mg total) by mouth every 6 (six) hours as needed for up to 15 days for moderate pain or severe pain. 08/08/20 08/23/20 Yes Vevelyn Francois, NP  mirtazapine (REMERON) 45 MG tablet Take 1 tablet (45 mg total) by mouth at bedtime. 04/13/20 04/13/21 Yes Vevelyn Francois, NP  omeprazole (PRILOSEC) 20 MG capsule Take 1 capsule (20 mg total) by mouth 2 (two) times daily before a meal. 08/02/20 08/02/21 Yes King, Diona Foley, NP  polyethylene glycol (MIRALAX / GLYCOLAX) 17 g packet Take 17 g by mouth daily as needed for mild constipation.   Yes [provider]  promethazine (PHENERGAN) 25 MG tablet Take 0.5-1 tablets  (12.5-25 mg total) by mouth every 6 (six) hours as needed for nausea or vomiting. 08/02/20 08/02/21 Yes Vevelyn Francois, NP  vitamin B-12 (CYANOCOBALAMIN) 1000 MCG tablet Take 1 tablet (1,000 mcg total) by mouth daily. 04/13/20 04/13/21 Yes King, Crystal M, NP  XARELTO 20 MG TABS tablet Take 1 tablet (20 mg total) by mouth daily with supper. 04/13/20 04/13/21 Yes King, Diona Foley, NP  mometasone-formoterol (DULERA) 100-5 MCG/ACT AERO Inhale 2 puffs into the lungs daily as needed for wheezing or shortness of breath. Patient not taking: Reported on 08/06/2020 04/13/20 04/13/21  Vevelyn Francois, NP    Allergies    Cefaclor, Hydroxyurea, and Ketamine  Review of Systems   Review of Systems  Cardiovascular: Positive for chest pain.  Ten systems reviewed and are negative for acute change, except as noted in the HPI.    Physical Exam Updated Vital Signs BP (!) 107/95   Pulse 94   Temp 98.6 F (37 C) (Oral)   Resp (!) 27   LMP 07/26/2020   SpO2 93%   Physical  Exam Vitals and nursing note reviewed.  Constitutional:      General: She is not in acute distress.    Appearance: She is well-developed and well-nourished. She is not diaphoretic.     Comments: Patient in NAD, appears mildly uncomfortable.  HENT:     Head: Normocephalic and atraumatic.  Eyes:     General: No scleral icterus.    Extraocular Movements: EOM normal.     Conjunctiva/sclera: Conjunctivae normal.  Pulmonary:     Effort: Pulmonary effort is normal. No respiratory distress.     Breath sounds: No stridor. No wheezing.     Comments: Respirations even and unlabored Abdominal:     General: There is no distension.  Musculoskeletal:        General: Normal range of motion.     Cervical back: Normal range of motion.  Skin:    General: Skin is warm and dry.     Coloration: Skin is not pale.     Findings: No erythema or rash.  Neurological:     Mental Status: She is alert and oriented to person, place, and time.      Coordination: Coordination normal.     Comments: GCS 15. Moving all extremities spontaneously.  Psychiatric:        Mood and Affect: Mood and affect normal.        Behavior: Behavior normal.     ED Results / Procedures / Treatments   Labs (all labs ordered are listed, but only abnormal results are displayed) Labs Reviewed  CBC WITH DIFFERENTIAL/PLATELET - Abnormal; Notable for the following components:      Result Value   WBC 16.7 (*)    RBC 2.59 (*)    Hemoglobin 8.1 (*)    HCT 23.4 (*)    RDW 21.4 (*)    Platelets 489 (*)    nRBC 0.7 (*)    Neutro Abs 10.6 (*)    Monocytes Absolute 2.0 (*)    Abs Immature Granulocytes 0.08 (*)    All other components within normal limits  COMPREHENSIVE METABOLIC PANEL - Abnormal; Notable for the following components:   Total Protein 8.7 (*)    Total Bilirubin 4.5 (*)    All other components within normal limits  RETICULOCYTES - Abnormal; Notable for the following components:   Retic Ct Pct 18.8 (*)    RBC. 2.61 (*)    Retic Count, Absolute 497.5 (*)    All other components within normal limits  I-STAT BETA HCG BLOOD, ED ([facility], [facility], [facility] ONLY)  TROPONIN I (HIGH SENSITIVITY)    EKG EKG Interpretation  Date/Time:  Saturday August 05 2020 21:11:41 EST Ventricular Rate:  96 PR Interval:    QRS Duration: 82 QT Interval:  367 QTC Calculation: 464 R Axis:   68 Text Interpretation: Sinus rhythm Abnormal R-wave progression, early transition Borderline T abnormalities, anterior leads No significant change was found Confirmed by Shanon Rosser [phone]) on 08/06/2020 3:20:17 AM   Radiology DG Chest 2 View  Result Date: 08/05/2020 CLINICAL DATA:  Chest pain due to sickle cell. EXAM: CHEST - 2 VIEW COMPARISON:  June 04, 2020 FINDINGS: Stable bilateral venous Port-A-Cath positioning is noted. The heart size and mediastinal contours are within normal limits. Both lungs are clear. The visualized skeletal structures are unremarkable. Radiopaque  surgical clips are seen within the right upper quadrant. IMPRESSION: No active cardiopulmonary disease. Electronically Signed   By: Virgina Norfolk M.D.   On: 08/05/2020 23:14    Procedures Procedures  Medications Ordered in ED Medications  dextrose 5 %-0.45 % sodium chloride infusion ( Intravenous New Bag/Given 08/05/20 2329)  diphenhydrAMINE (BENADRYL) capsule 25-50 mg (50 mg Oral Given 08/06/20 0203)  ondansetron (ZOFRAN) injection 4 mg (4 mg Intravenous Given 08/05/20 2321)  ketorolac (TORADOL) 15 MG/ML injection 15 mg (15 mg Intravenous Given 08/05/20 2320)  HYDROmorphone (DILAUDID) injection 2 mg (2 mg Intravenous Given 08/06/20 0010)  HYDROmorphone (DILAUDID) injection 2 mg (2 mg Intravenous Given 08/06/20 0204)    ED Course  I have reviewed the triage vital signs and the nursing notes.  Pertinent labs & imaging results that were available during my care of the patient were reviewed by me and considered in my medical decision making (see chart for details).  Clinical Course as of 08/06/20 2707  Nancy Fetter Aug 06, 2020  0041 Patient reassessed. Reports improvement in pain. Labs reviewed. Leukocytosis and anemia stable. Troponin negative. CXR w/o acute cardiopulmonary abnormality. [KH]  [phone] Patient reports that pain has improved and she feels comfortable with discharge and outpatient follow-up. [KH]    Clinical Course User Index [KH] Antonietta Breach, PA-C   MDM Rules/Calculators/A&P                          40 year old female presenting to the ED for evaluation of chest pain consistent with past episodes of sickle cell crisis.  She has no findings of acute chest syndrome on ED evaluation.  Has been given IV Toradol as well as Dilaudid for pain with interval improvement.  Vitals have been stable.  On reassessment, she expresses comfort with plan for discharge and outpatient follow-up.  Emphasized presentation to sickle cell day clinic should pain persist.  Return precautions discussed and  provided. Patient discharged in stable condition with no unaddressed concerns.   Final Clinical Impression(s) / ED Diagnoses Final diagnoses:  Sickle cell anemia with pain [facility])    Rx / DC Orders ED Discharge Orders    None       Antonietta Breach, PA-C 08/06/20 0320    Shanon Rosser, MD 08/06/20 [phone]

## 2020-08-05 NOTE — ED Triage Notes (Addendum)
Patient c/o pain to chest and back since last night. Hx sickle cell.   Requesting blood draw from port.

## 2020-08-06 DIAGNOSIS — D5700 Hb-SS disease with crisis, unspecified: Secondary | ICD-10-CM | POA: Diagnosis not present

## 2020-08-06 LAB — CBC WITH DIFFERENTIAL/PLATELET
Abs Immature Granulocytes: 0.08 10*3/uL — ABNORMAL HIGH (ref 0.00–0.07)
Basophils Absolute: 0.1 10*3/uL (ref 0.0–0.1)
Basophils Relative: 0 %
Eosinophils Absolute: 0.4 10*3/uL (ref 0.0–0.5)
Eosinophils Relative: 2 %
HCT: 23.4 % — ABNORMAL LOW (ref 36.0–46.0)
Hemoglobin: 8.1 g/dL — ABNORMAL LOW (ref 12.0–15.0)
Immature Granulocytes: 1 %
Lymphocytes Relative: 22 %
Lymphs Abs: 3.6 10*3/uL (ref 0.7–4.0)
MCH: 31.3 pg (ref 26.0–34.0)
MCHC: 34.6 g/dL (ref 30.0–36.0)
MCV: 90.3 fL (ref 80.0–100.0)
Monocytes Absolute: 2 10*3/uL — ABNORMAL HIGH (ref 0.1–1.0)
Monocytes Relative: 12 %
Neutro Abs: 10.6 10*3/uL — ABNORMAL HIGH (ref 1.7–7.7)
Neutrophils Relative %: 63 %
Platelets: 489 10*3/uL — ABNORMAL HIGH (ref 150–400)
RBC: 2.59 MIL/uL — ABNORMAL LOW (ref 3.87–5.11)
RDW: 21.4 % — ABNORMAL HIGH (ref 11.5–15.5)
WBC: 16.7 10*3/uL — ABNORMAL HIGH (ref 4.0–10.5)
nRBC: 0.7 % — ABNORMAL HIGH (ref 0.0–0.2)

## 2020-08-06 LAB — COMPREHENSIVE METABOLIC PANEL
ALT: 20 U/L (ref 0–44)
AST: 34 U/L (ref 15–41)
Albumin: 4.6 g/dL (ref 3.5–5.0)
Alkaline Phosphatase: 70 U/L (ref 38–126)
Anion gap: 10 (ref 5–15)
BUN: 10 mg/dL (ref 6–20)
CO2: 23 mmol/L (ref 22–32)
Calcium: 9.2 mg/dL (ref 8.9–10.3)
Chloride: 104 mmol/L (ref 98–111)
Creatinine, Ser: 0.53 mg/dL (ref 0.44–1.00)
GFR, Estimated: >60 mL/min (ref >60)
Glucose, Bld: 98 mg/dL (ref 70–99)
Potassium: 3.6 mmol/L (ref 3.5–5.1)
Sodium: 137 mmol/L (ref 135–145)
Total Bilirubin: 4.5 mg/dL — ABNORMAL HIGH (ref 0.3–1.2)
Total Protein: 8.7 g/dL — ABNORMAL HIGH (ref 6.5–8.1)

## 2020-08-06 LAB — TROPONIN I (HIGH SENSITIVITY): Troponin I (High Sensitivity): 2 ng/L (ref <18)

## 2020-08-06 MED ORDER — HYDROMORPHONE HCL 2 MG/ML IJ SOLN
2.0000 mg | Freq: Once | INTRAMUSCULAR | Status: AC
  Administered 2020-08-06: 2 mg via INTRAVENOUS
  Filled 2020-08-06: qty 1

## 2020-08-06 MED ORDER — HEPARIN SOD (PORK) LOCK FLUSH 100 UNIT/ML IV SOLN
500.0000 [IU] | Freq: Once | INTRAVENOUS | Status: AC
  Administered 2020-08-06: 500 [IU]
  Filled 2020-08-06: qty 5

## 2020-08-14 ENCOUNTER — Other Ambulatory Visit: Payer: Self-pay | Admitting: Nurse Practitioner

## 2020-08-14 MED ORDER — ERGOCALCIFEROL 1.25 MG (50000 UT) PO CAPS
50000.0000 [IU] | ORAL_CAPSULE | ORAL | 0 refills | Status: DC
Start: 2020-08-14 — End: 2020-11-13

## 2020-08-14 NOTE — Progress Notes (Signed)
   [place],   [address] Phone:  [phone]   Fax:  [phone]  Vitamin D deficiency vitamin D 50,000 units started.  Vitamin D 25 mcg on hold for now

## 2020-08-23 ENCOUNTER — Encounter (HOSPITAL_COMMUNITY): Payer: Self-pay | Admitting: Family Medicine

## 2020-08-23 ENCOUNTER — Other Ambulatory Visit: Payer: Self-pay

## 2020-08-23 ENCOUNTER — Telehealth (HOSPITAL_COMMUNITY): Payer: Self-pay | Admitting: *Deleted

## 2020-08-23 ENCOUNTER — Encounter (HOSPITAL_COMMUNITY): Payer: Self-pay | Admitting: *Deleted

## 2020-08-23 ENCOUNTER — Non-Acute Institutional Stay (HOSPITAL_BASED_OUTPATIENT_CLINIC_OR_DEPARTMENT_OTHER)
Admission: AD | Admit: 2020-08-23 | Discharge: 2020-08-23 | Disposition: A | Discharge status: Home / Self Care | Payer: Medicare Other | Source: Ambulatory Visit | Attending: Internal Medicine | Admitting: Internal Medicine

## 2020-08-23 ENCOUNTER — Emergency Department (HOSPITAL_COMMUNITY)
Admission: EM | Admit: 2020-08-23 | Discharge: 2020-08-23 | Disposition: A | Discharge status: Home / Self Care | Payer: Medicare Other | Attending: Emergency Medicine | Admitting: Emergency Medicine

## 2020-08-23 ENCOUNTER — Telehealth (HOSPITAL_COMMUNITY): Payer: Self-pay | Admitting: General Practice

## 2020-08-23 ENCOUNTER — Other Ambulatory Visit: Payer: Self-pay | Admitting: Nurse Practitioner

## 2020-08-23 DIAGNOSIS — Z86711 Personal history of pulmonary embolism: Secondary | ICD-10-CM | POA: Insufficient documentation

## 2020-08-23 DIAGNOSIS — Z79899 Other long term (current) drug therapy: Secondary | ICD-10-CM | POA: Insufficient documentation

## 2020-08-23 DIAGNOSIS — D5700 Hb-SS disease with crisis, unspecified: Secondary | ICD-10-CM

## 2020-08-23 DIAGNOSIS — D57219 Sickle-cell/Hb-C disease with crisis, unspecified: Secondary | ICD-10-CM | POA: Diagnosis not present

## 2020-08-23 DIAGNOSIS — Z888 Allergy status to other drugs, medicaments and biological substances status: Secondary | ICD-10-CM | POA: Insufficient documentation

## 2020-08-23 DIAGNOSIS — G894 Chronic pain syndrome: Secondary | ICD-10-CM | POA: Insufficient documentation

## 2020-08-23 DIAGNOSIS — Z7901 Long term (current) use of anticoagulants: Secondary | ICD-10-CM | POA: Insufficient documentation

## 2020-08-23 DIAGNOSIS — F1120 Opioid dependence, uncomplicated: Secondary | ICD-10-CM | POA: Insufficient documentation

## 2020-08-23 DIAGNOSIS — Z881 Allergy status to other antibiotic agents status: Secondary | ICD-10-CM | POA: Insufficient documentation

## 2020-08-23 DIAGNOSIS — Z9049 Acquired absence of other specified parts of digestive tract: Secondary | ICD-10-CM | POA: Insufficient documentation

## 2020-08-23 DIAGNOSIS — Z96649 Presence of unspecified artificial hip joint: Secondary | ICD-10-CM | POA: Diagnosis not present

## 2020-08-23 DIAGNOSIS — J4520 Mild intermittent asthma, uncomplicated: Secondary | ICD-10-CM | POA: Insufficient documentation

## 2020-08-23 DIAGNOSIS — Z7951 Long term (current) use of inhaled steroids: Secondary | ICD-10-CM | POA: Insufficient documentation

## 2020-08-23 DIAGNOSIS — M79605 Pain in left leg: Secondary | ICD-10-CM | POA: Insufficient documentation

## 2020-08-23 DIAGNOSIS — Z9660 Presence of unspecified orthopedic joint implant: Secondary | ICD-10-CM | POA: Insufficient documentation

## 2020-08-23 DIAGNOSIS — Z86718 Personal history of other venous thrombosis and embolism: Secondary | ICD-10-CM | POA: Diagnosis not present

## 2020-08-23 DIAGNOSIS — Z791 Long term (current) use of non-steroidal anti-inflammatories (NSAID): Secondary | ICD-10-CM | POA: Insufficient documentation

## 2020-08-23 DIAGNOSIS — M545 Low back pain, unspecified: Secondary | ICD-10-CM | POA: Diagnosis present

## 2020-08-23 LAB — CBC WITH DIFFERENTIAL/PLATELET
Abs Immature Granulocytes: 0.14 10*3/uL — ABNORMAL HIGH (ref 0.00–0.07)
Basophils Absolute: 0.1 10*3/uL (ref 0.0–0.1)
Basophils Relative: 0 %
Eosinophils Absolute: 0.2 10*3/uL (ref 0.0–0.5)
Eosinophils Relative: 1 %
HCT: 24.3 % — ABNORMAL LOW (ref 36.0–46.0)
Hemoglobin: 8.2 g/dL — ABNORMAL LOW (ref 12.0–15.0)
Immature Granulocytes: 1 %
Lymphocytes Relative: 9 %
Lymphs Abs: 2.2 10*3/uL (ref 0.7–4.0)
MCH: 29.7 pg (ref 26.0–34.0)
MCHC: 33.7 g/dL (ref 30.0–36.0)
MCV: 88 fL (ref 80.0–100.0)
Monocytes Absolute: 2.6 10*3/uL — ABNORMAL HIGH (ref 0.1–1.0)
Monocytes Relative: 11 %
Neutro Abs: 19.1 10*3/uL — ABNORMAL HIGH (ref 1.7–7.7)
Neutrophils Relative %: 78 %
Platelets: 497 10*3/uL — ABNORMAL HIGH (ref 150–400)
RBC: 2.76 MIL/uL — ABNORMAL LOW (ref 3.87–5.11)
RDW: 20.5 % — ABNORMAL HIGH (ref 11.5–15.5)
WBC: 24.4 10*3/uL — ABNORMAL HIGH (ref 4.0–10.5)
nRBC: 0.5 % — ABNORMAL HIGH (ref 0.0–0.2)

## 2020-08-23 LAB — RETICULOCYTES
Immature Retic Fract: 6.9 % (ref 2.3–15.9)
RBC.: 2.78 MIL/uL — ABNORMAL LOW (ref 3.87–5.11)
Retic Count, Absolute: 327 10*3/uL — ABNORMAL HIGH (ref 19.0–186.0)
Retic Ct Pct: 10.9 % — ABNORMAL HIGH (ref 0.4–3.1)

## 2020-08-23 LAB — COMPREHENSIVE METABOLIC PANEL
ALT: 22 U/L (ref 0–44)
AST: 32 U/L (ref 15–41)
Albumin: 4.3 g/dL (ref 3.5–5.0)
Alkaline Phosphatase: 63 U/L (ref 38–126)
Anion gap: 8 (ref 5–15)
BUN: 9 mg/dL (ref 6–20)
CO2: 22 mmol/L (ref 22–32)
Calcium: 9.2 mg/dL (ref 8.9–10.3)
Chloride: 108 mmol/L (ref 98–111)
Creatinine, Ser: 0.63 mg/dL (ref 0.44–1.00)
GFR, Estimated: >60 mL/min (ref >60)
Glucose, Bld: 94 mg/dL (ref 70–99)
Potassium: 3.7 mmol/L (ref 3.5–5.1)
Sodium: 138 mmol/L (ref 135–145)
Total Bilirubin: 3.5 mg/dL — ABNORMAL HIGH (ref 0.3–1.2)
Total Protein: 8.2 g/dL — ABNORMAL HIGH (ref 6.5–8.1)

## 2020-08-23 LAB — PREGNANCY, URINE: Preg Test, Ur: NEGATIVE

## 2020-08-23 MED ORDER — SODIUM CHLORIDE 0.45 % IV SOLN
INTRAVENOUS | Status: DC
Start: 2020-08-23 — End: 2020-08-23

## 2020-08-23 MED ORDER — HYDROMORPHONE HCL 2 MG/ML IJ SOLN
2.0000 mg | INTRAMUSCULAR | Status: DC
  Filled 2020-08-23: qty 1

## 2020-08-23 MED ORDER — ACETAMINOPHEN 500 MG PO TABS
1000.0000 mg | ORAL_TABLET | Freq: Once | ORAL | Status: AC
  Administered 2020-08-23: 1000 mg via ORAL
  Filled 2020-08-23: qty 2

## 2020-08-23 MED ORDER — KETOROLAC TROMETHAMINE 15 MG/ML IJ SOLN: 15.0000 mg | INTRAMUSCULAR | Status: DC

## 2020-08-23 MED ORDER — ONDANSETRON HCL 4 MG/2ML IJ SOLN: 4.0000 mg | Freq: Once | INTRAMUSCULAR | Status: DC

## 2020-08-23 MED ORDER — HEPARIN SOD (PORK) LOCK FLUSH 100 UNIT/ML IV SOLN
500.0000 [IU] | INTRAVENOUS | Status: AC | PRN
  Administered 2020-08-23: 500 [IU]
  Filled 2020-08-23: qty 5

## 2020-08-23 MED ORDER — SODIUM CHLORIDE 0.9% FLUSH
10.0000 mL | INTRAVENOUS | Status: AC | PRN
  Administered 2020-08-23: 10 mL

## 2020-08-23 MED ORDER — HYDROMORPHONE HCL 2 MG/ML IJ SOLN: 2.0000 mg | INTRAMUSCULAR | Status: DC

## 2020-08-23 MED ORDER — SODIUM CHLORIDE 0.45 % IV SOLN: INTRAVENOUS | Status: DC

## 2020-08-23 MED ORDER — KETOROLAC TROMETHAMINE 30 MG/ML IJ SOLN: 15.0000 mg | Freq: Once | INTRAMUSCULAR | Status: DC

## 2020-08-23 MED ORDER — HYDROMORPHONE HCL 4 MG PO TABS: 4.0000 mg | ORAL_TABLET | Freq: Four times a day (QID) | ORAL | 0 refills | Status: DC | PRN

## 2020-08-23 MED ORDER — ONDANSETRON HCL 4 MG/2ML IJ SOLN
4.0000 mg | Freq: Once | INTRAMUSCULAR | Status: AC
  Administered 2020-08-23: 4 mg via INTRAVENOUS
  Filled 2020-08-23: qty 2

## 2020-08-23 MED ORDER — OXYCODONE HCL 5 MG PO TABS
20.0000 mg | ORAL_TABLET | Freq: Once | ORAL | Status: AC
  Administered 2020-08-23: 20 mg via ORAL
  Filled 2020-08-23: qty 4

## 2020-08-23 MED ORDER — HYDROMORPHONE HCL 2 MG/ML IJ SOLN
2.0000 mg | INTRAMUSCULAR | Status: AC
  Administered 2020-08-23 (×3): 2 mg via SUBCUTANEOUS
  Filled 2020-08-23 (×2): qty 1

## 2020-08-23 NOTE — Discharge Instructions (Signed)
Sickle Cell Anemia, Adult  Sickle cell anemia is a condition where your red blood cells are shaped like sickles. Red blood cells carry oxygen through the body. Sickle-shaped cells do not live as long as normal red blood cells. They also clump together and block blood from flowing through the blood vessels. This prevents the body from getting enough oxygen. Sickle cell anemia causes organ damage and pain. It also increases the risk of infection. Follow these instructions at home: Medicines  Take over-the-counter and prescription medicines only as told by your doctor.  If you were prescribed an antibiotic medicine, take it as told by your doctor. Do not stop taking the antibiotic even if you start to feel better.  If you develop a fever, do not take medicines to lower the fever right away. Tell your doctor about the fever. Managing pain, stiffness, and swelling  Try these methods to help with pain: ? Use a heating pad. ? Take a warm bath. ? Distract yourself, such as by watching TV. Eating and drinking  Drink enough fluid to keep your pee (urine) clear or pale yellow. Drink more in hot weather and during exercise.  Limit or avoid alcohol.  Eat a healthy diet. Eat plenty of fruits, vegetables, whole grains, and lean protein.  Take vitamins and supplements as told by your doctor. Traveling  When traveling, keep these with you: ? Your medical information. ? The names of your doctors. ? Your medicines.  If you need to take an airplane, talk to your doctor first. Activity  Rest often.  Avoid exercises that make your heart beat much faster, such as jogging. General instructions  Do not use products that have nicotine or tobacco, such as cigarettes and e-cigarettes. If you need help quitting, ask your doctor.  Consider wearing a medical alert bracelet.  Avoid being in high places (high altitudes), such as mountains.  Avoid very hot or cold temperatures.  Avoid places where the  temperature changes a lot.  Keep all follow-up visits as told by your doctor. This is important. Contact a doctor if:  A joint hurts.  Your feet or hands hurt or swell.  You feel tired (fatigued). Get help right away if:  You have symptoms of infection. These include: ? Fever. ? Chills. ? Being very tired. ? Irritability. ? Poor eating. ? Throwing up (vomiting).  You feel dizzy or faint.  You have new stomach pain, especially on the left side.  You have a an erection (priapism) that lasts more than 4 hours.  You have numbness in your arms or legs.  You have a hard time moving your arms or legs.  You have trouble talking.  You have pain that does not go away when you take medicine.  You are short of breath.  You are breathing fast.  You have a long-term cough.  You have pain in your chest.  You have a bad headache.  You have a stiff neck.  Your stomach looks bloated even though you did not eat much.  Your skin is pale.  You suddenly cannot see well. Summary  Sickle cell anemia is a condition where your red blood cells are shaped like sickles.  Follow your doctor's advice on ways to manage pain, food to eat, activities to do, and steps to take for safe travel.  Get medical help right away if you have any signs of infection, such as a fever. This information is not intended to replace advice given to you by   your health care provider. Make sure you discuss any questions you have with your health care provider. Document Revised: 10/28/2019 Document Reviewed: 10/28/2019 Elsevier Patient Education  2021 Elsevier Inc.  

## 2020-08-23 NOTE — ED Triage Notes (Signed)
Pt complains of back and left leg pain since yesterday. Hx sickle cell disease.

## 2020-08-23 NOTE — Discharge Summary (Signed)
[facility] Discharge Summary   Patient ID: Molly Gutierrez MRN: [id number] DOB/AGE: [birth year] 40 y.o.  Admit date: 08/23/2020 Discharge date: 08/23/2020  Primary Care Physician:  Vevelyn Francois, NP  Admission Diagnoses:  Active Problems:   Sickle cell pain crisis [facility])   Discharge Medications:  Allergies as of 08/23/2020      Reactions   Cefaclor Hives, Swelling   Hydroxyurea Palpitations, Other (See Comments)   Lowers "blood levels" and heart rate (causes HYPOtension); "it messes me up, it drops my levels and stuff" Other reaction(s): Hypotension, Other (See Comments) "it messes me up, it drops my levels and stuff" Lower blood levels and HR Other reaction(s): Other (See Comments) Lowers "blood levels" and heart rate (causes HYPOtension); "it messes me up, it drops my levels and stuff"   Ketamine Palpitations, Other (See Comments)   "Pt states she has had previous reaction to ketamine. States she becomes flushed, heart races, dizzy, and feels like she is going to pass out." Other reaction(s): Other (See Comments), Other (See Comments) "Pt states she has had previous reaction to ketamine. States she becomes flushed, heart races, dizzy, and feels like she is going to pass out." "Pt states she has had previous reaction to ketamine. States she becomes flushed, heart races, dizzy, and feels like she is going to pass out." Other reaction(s): Other (See Comments) tachycardia      Medication List    TAKE these medications   albuterol 108 (90 Base) MCG/ACT inhaler Commonly known as: VENTOLIN HFA Inhale 2 puffs into the lungs 2 (two) times daily as needed for wheezing or shortness of breath.   budesonide-formoterol 80-4.5 MCG/ACT inhaler Commonly known as: SYMBICORT Inhale 2 puffs into the lungs 2 (two) times daily.   celecoxib 200 MG capsule Commonly known as: CELEBREX Take 1 capsule (200 mg total) by mouth daily.   Deferasirox 360 MG Tabs Commonly known  as: Jadenu Take 3 tablets (1,080 mg total) by mouth daily before breakfast.   diphenhydrAMINE 25 mg capsule Commonly known as: BENADRYL Take 25 mg by mouth 3 (three) times daily as needed for itching.   ergocalciferol 1.25 MG (50000 UT) capsule Commonly known as: VITAMIN D2 Take 1 capsule (50,000 Units total) by mouth once a week. X 12 weeks.   folic acid 1 MG tablet Commonly known as: FOLVITE Take 1 tablet (1 mg total) by mouth daily.   gabapentin 300 MG capsule Commonly known as: NEURONTIN Take 1 capsule (300 mg total) by mouth 3 (three) times daily.   HYDROmorphone 4 MG tablet Commonly known as: DILAUDID Take 1 tablet (4 mg total) by mouth every 6 (six) hours as needed for up to 15 days for moderate pain or severe pain.   mirtazapine 45 MG tablet Commonly known as: REMERON Take 1 tablet (45 mg total) by mouth at bedtime.   mometasone-formoterol 100-5 MCG/ACT Aero Commonly known as: DULERA Inhale 2 puffs into the lungs daily as needed for wheezing or shortness of breath.   omeprazole 20 MG capsule Commonly known as: PRILOSEC Take 1 capsule (20 mg total) by mouth 2 (two) times daily before a meal.   polyethylene glycol 17 g packet Commonly known as: MIRALAX / GLYCOLAX Take 17 g by mouth daily as needed for mild constipation.   promethazine 25 MG tablet Commonly known as: PHENERGAN Take 0.5-1 tablets (12.5-25 mg total) by mouth every 6 (six) hours as needed for nausea or vomiting.   vitamin B-12 1000 MCG tablet Commonly known as:  CYANOCOBALAMIN Take 1 tablet (1,000 mcg total) by mouth daily.   Vitamin D3 25 MCG (1000 UT) Caps Take 1 capsule (1,000 Units total) by mouth daily.   Xarelto 20 MG Tabs tablet Generic drug: rivaroxaban Take 1 tablet (20 mg total) by mouth daily with supper.        Consults:  None  Significant Diagnostic Studies:  DG Chest 2 View  Result Date: 08/05/2020 CLINICAL DATA:  Chest pain due to sickle cell. EXAM: CHEST - 2 VIEW  COMPARISON:  June 04, 2020 FINDINGS: Stable bilateral venous Port-A-Cath positioning is noted. The heart size and mediastinal contours are within normal limits. Both lungs are clear. The visualized skeletal structures are unremarkable. Radiopaque surgical clips are seen within the right upper quadrant. IMPRESSION: No active cardiopulmonary disease. Electronically Signed   By: Virgina Norfolk M.D.   On: 08/05/2020 23:14    History of present illness:  Molly Gutierrez is a 40 year old female with old female with a medical history significant for sickle cell disease, chronic pain syndrome, opiate dependence and tolerance, history of mild intermittent asthma, history of PE on Xarelto, and history of anemia of chronic disease presents with complaints of low back and left lower extremity pain that is consistent with her typical sickle cell pain crisis. She says that pain has been increased over the past 2 days. She attributes pain crisis to being out of home opiate pain medications. She currently rates pain as 9/10 characterized as constant and throbbing. She not had any pain medications over the past several days. She denies any headache, subjective fever, chills, chest pain, nausea, vomiting, or diarrhea.   [facility] Course:  Patient admitted to sickle cell day clinic for management of pain crisis. Reviewed all laboratory values, largely consistent with patient's baseline.  Pain managed with Dilaudid 2 mg SQ x3 Oxycodone 20 mg x1 Toradol 15 mg x1 Tylenol 1000 mg x1 IV fluids, 0.45% saline at 150 ml/hr Pain intensity decreased to 6/10. Patient does not warrant admission on today. Patient advised to pick up home medications from her pharmacy on tomorrow.  Patient alert, oriented, and ambulating without assistance.  She will discharge home in a hemodynamically stable condition  Discharge instructions:  Resume all home medications.   Follow up with PCP as previously  scheduled.   Discussed  the importance of drinking 64 ounces of water daily, dehydration of red blood cells may lead further sickling.   Avoid all stressors that precipitate sickle cell pain crisis.     The patient was given clear instructions to go to ER or return to medical center if symptoms do not improve, worsen or new problems develop.     Physical Exam at Discharge:  BP 115/77 (BP Location: Right Arm)    Pulse 94    Temp 98.3 F (36.8 C) (Temporal)    Resp 16    LMP 08/14/2020    SpO2 96%   Physical Exam Constitutional:      Appearance: Normal appearance.  Cardiovascular:     Rate and Rhythm: Normal rate and regular rhythm.     Pulses: Normal pulses.  Pulmonary:     Effort: Pulmonary effort is normal.  Abdominal:     General: Bowel sounds are normal.  Musculoskeletal:        General: Normal range of motion.  Skin:    General: Skin is warm.  Neurological:     General: No focal deficit present.     Mental Status: She is alert.  Mental status is at baseline.  Psychiatric:        Mood and Affect: Mood normal.        Thought Content: Thought content normal.        Judgment: Judgment normal.      Disposition at Discharge: Discharge disposition: 01-Home or Self Care       Discharge Orders: Discharge Instructions    Discharge patient   Complete by: As directed    Discharge disposition: 01-Home or Self Care   Discharge patient date: 08/23/2020      Condition at Discharge:   Stable  Time spent on Discharge:  Greater than 30 minutes.  Signed: [name]  APRN, MSN, FNP-C Patient [facility] [address] [place], [place] [address] [phone]  08/23/2020, 4:04 PM

## 2020-08-23 NOTE — ED Notes (Signed)
Provider told this RN that pt would be going to sickle cell clinic, they would access port and care for pt there. Pt discharged from ED, provider stated there are no dc papers to give pt from ED. CNA took pt to clinic in wheelchair, pt A&O X4

## 2020-08-23 NOTE — Progress Notes (Signed)
Patient admitted to the day hospital for treatment of sickle cell pain crisis. Patient reported pain rated 8/10 in the back and left leg. Patient given sub q dilaudid, IV Zofran, PO tylenol, PO oxycodone and hydrated with IV fluids. At discharge patient reported  pain rated at 6/10. Declined printed AVS. Alert, oriented and ambulatory at discharge.

## 2020-08-23 NOTE — Telephone Encounter (Signed)
Patient called requesting to come to the day hospital for sickle cell pain. Patient reports back and bilateral leg pain rated 8/10. Reports being out of pain medication since last week. COVID-19 screening done and patient denies all symptoms and exposures. Denies fever, chest pain, nausea, vomiting, diarrhea and abdominal pain. Spoke with patient's PCP, Dionisio David about patient's prescribed pain medication. Patient's medication is due today and PCP will send in prescription so that patient can pick it up. [place] notified and advised that patient stay home, pick up medication from pharmacy, alternate Tylenol with Ibuprofen and hydrate with 64 ounce of water. Patient advised and expresses an understanding.

## 2020-08-23 NOTE — H&P (Signed)
[facility] History and Physical   Date: 08/23/2020  Patient name: Molly Gutierrez Medical record number: [id number] Date of birth: 01-01-1981 Age: 40 y.o. Gender: female PCP: Vevelyn Francois, NP  Attending physician: [name], MD  Chief Complaint: Sickle cell pain  History of Present Illness: Molly Gutierrez is a 40 year old female with a medical history significant for sickle cell disease, chronic pain syndrome, opiate dependence and tolerance, history of mild intermittent asthma, history of PE on Xarelto, and history of anemia of chronic disease presents with complaints of low back and left lower extremity pain that is consistent with her typical sickle cell pain crisis. She says that pain has been increased over the past 2 days. She attributes pain crisis to being out of home opiate pain medications. She currently rates pain as 9/10 characterized as constant and throbbing. She not had any pain medications over the past several days. She denies any headache, subjective fever, chills, chest pain, nausea, vomiting, or diarrhea.   Meds: Medications Prior to Admission  Medication Sig Dispense Refill Last Dose  . albuterol (VENTOLIN HFA) 108 (90 Base) MCG/ACT inhaler Inhale 2 puffs into the lungs 2 (two) times daily as needed for wheezing or shortness of breath. 8 g 11   . budesonide-formoterol (SYMBICORT) 80-4.5 MCG/ACT inhaler Inhale 2 puffs into the lungs 2 (two) times daily. 1 each 3   . celecoxib (CELEBREX) 200 MG capsule Take 1 capsule (200 mg total) by mouth daily. 90 capsule 3   . Cholecalciferol (VITAMIN D3) 25 MCG (1000 UT) CAPS Take 1 capsule (1,000 Units total) by mouth daily. 90 capsule 3   . Deferasirox (JADENU) 360 MG TABS Take 3 tablets (1,080 mg total) by mouth daily before breakfast. 90 tablet 11   . diphenhydrAMINE (BENADRYL) 25 mg capsule Take 25 mg by mouth 3 (three) times daily as needed for itching.     . ergocalciferol (VITAMIN D2)  1.25 MG (50000 UT) capsule Take 1 capsule (50,000 Units total) by mouth once a week. X 12 weeks. 12 capsule 0   . folic acid (FOLVITE) 1 MG tablet Take 1 tablet (1 mg total) by mouth daily. 90 tablet 3   . gabapentin (NEURONTIN) 300 MG capsule Take 1 capsule (300 mg total) by mouth 3 (three) times daily. 270 capsule 3   . HYDROmorphone (DILAUDID) 4 MG tablet Take 1 tablet (4 mg total) by mouth every 6 (six) hours as needed for up to 15 days for moderate pain or severe pain. 60 tablet 0   . mirtazapine (REMERON) 45 MG tablet Take 1 tablet (45 mg total) by mouth at bedtime. 90 tablet 3   . mometasone-formoterol (DULERA) 100-5 MCG/ACT AERO Inhale 2 puffs into the lungs daily as needed for wheezing or shortness of breath. (Patient not taking: Reported on 08/06/2020) 1 each 5   . omeprazole (PRILOSEC) 20 MG capsule Take 1 capsule (20 mg total) by mouth 2 (two) times daily before a meal. 180 capsule 3   . polyethylene glycol (MIRALAX / GLYCOLAX) 17 g packet Take 17 g by mouth daily as needed for mild constipation.     . promethazine (PHENERGAN) 25 MG tablet Take 0.5-1 tablets (12.5-25 mg total) by mouth every 6 (six) hours as needed for nausea or vomiting. 30 tablet 11   . vitamin B-12 (CYANOCOBALAMIN) 1000 MCG tablet Take 1 tablet (1,000 mcg total) by mouth daily. 90 tablet 3   . XARELTO 20 MG TABS tablet Take 1  tablet (20 mg total) by mouth daily with supper. 90 tablet 3     Allergies: Cefaclor, Hydroxyurea, and Ketamine Past Medical History:  Diagnosis Date  . Asthma   . Eczema   . History of pulmonary embolus (PE)   . Sickle cell anemia (HCC)    Past Surgical History:  Procedure Laterality Date  . CHOLECYSTECTOMY    . ERCP    . JOINT REPLACEMENT    . PORTA CATH INSERTION    . TUBAL LIGATION    . WISDOM TOOTH EXTRACTION     Family History  Problem Relation Age of Onset  . Renal Disease Mother   . Hypertension Mother   . High Cholesterol Mother    Social History   Socioeconomic  History  . Marital status: Single    Spouse name: Not on file  . Number of children: Not on file  . Years of education: Not on file  . Highest education level: Not on file  Occupational History  . Not on file  Tobacco Use  . Smoking status: Never Smoker  . Smokeless tobacco: Never Used  Vaping Use  . Vaping Use: Never used  Substance and Sexual Activity  . Alcohol use: Never  . Drug use: Never  . Sexual activity: Not on file  Other Topics Concern  . Not on file  Social History Narrative  . Not on file   Social Determinants of Health   Financial Resource Strain: Not on file  Food Insecurity: Not on file  Transportation Needs: Not on file  Physical Activity: Not on file  Stress: Not on file  Social Connections: Not on file  Intimate Partner Violence: Not on file   Review of Systems  Constitutional: Negative for chills and fever.  HENT: Negative.   Eyes: Negative.   Respiratory: Negative.   Cardiovascular: Negative.   Genitourinary: Negative.   Musculoskeletal: Positive for back pain and joint pain.  Skin: Negative.   Neurological: Negative.   Psychiatric/Behavioral: Negative.     Physical Exam: Last menstrual period 07/26/2020. Physical Exam Eyes:     Pupils: Pupils are equal, round, and reactive to light.  Cardiovascular:     Rate and Rhythm: Normal rate and regular rhythm.  Pulmonary:     Effort: Pulmonary effort is normal.     Breath sounds: Normal breath sounds.  Abdominal:     General: Bowel sounds are normal.  Musculoskeletal:        General: Normal range of motion.  Neurological:     General: No focal deficit present.     Mental Status: Mental status is at baseline.  Psychiatric:        Mood and Affect: Mood normal.        Behavior: Behavior normal.        Thought Content: Thought content normal.        Judgment: Judgment normal.      Lab results: No results found for this or any previous visit (from the past 24 hour(s)).  Imaging  results:  No results found.   Assessment & Plan: Patient admitted to sickle cell day infusion center for management of pain crisis.  Patient is opiate tolerant Dilaudid 2 mg SQ x3 IV fluids, 0.45% saline at 150 ml/hr Toradol 15 mg IV times one dose Tylenol 1000 mg by mouth times one dose Review CBC with differential, complete metabolic panel, and reticulocytes as results become available. Pain intensity will be reevaluated in context of functioning and relationship to baseline  as care progresses If pain intensity remains elevated and/or sudden change in hemodynamic stability transition to inpatient services for higher level of care.    [name]  APRN, MSN, FNP-C Patient [facility] [address] [place], [place] [address] [phone]    08/23/2020, 12:10 PM

## 2020-08-23 NOTE — Progress Notes (Signed)
   [place], [place]  [address] Phone:  [phone]   Fax:  [phone] PDMP not reviewed this encounter.Subjective:    [facility] calls for refill of her hydromorphone.    Objective:   Indication for chronic opioid: Chronic pain related to sickle cell disease Medication and dose: Hydromorphone 4 mg  # pills per month: 120 Last UDS date: 05/24/2020 Opioid Treatment Agreement signed (Y/N): Yes Opioid Treatment Agreement last reviewed with patient:  2021 [place] reviewed this encounter (include red flags):  Yes no red flags last refill 08/08/2020   Assessment:   Chronic Pain Syndrome related to Sickle Cell Disease    Plan:   Hydromorphone 4 mg sent to [facility] Return for follow-up appointment as scheduled.

## 2020-08-23 NOTE — Telephone Encounter (Signed)
Per provider, patient should go to the closest ER in [place].

## 2020-08-23 NOTE — ED Provider Notes (Signed)
[facility] DEPT Provider Note   CSN: [id number] Arrival date & time: 08/23/20  1028     History Chief Complaint  Patient presents with  . Sickle Cell Pain Crisis    Molly Gutierrez is a 40 y.o. female presenting for evaluation of back and left leg pain.  Patient states her pain began last night.  She states this is consistent with a sickle cell flare.  She often has sickle cell pain in her chest, however she has had it in her back and leg before.  No fall, trauma, or injury.  It is normally triggered by weather changing.  She denies new numbness or tingling.  She has taken her Celebrex and gabapentin, but is out of her narcotic pain medication and cannot pick it up until tomorrow.  She denies fevers, chills, chest pain, shortness of breath, abdominal pain, urinary symptoms, normal bowel movements.  She does have mild associated nausea, one episode of vomiting last night.  Additional history obtained from chart review.  Patient with a history of sickle cell, asthma, emphysema.  History of PE is on Xarelto and taking it as prescribed  HPI     Past Medical History:  Diagnosis Date  . Asthma   . Eczema   . History of pulmonary embolus (PE)   . Sickle cell anemia [facility])     Patient Active Problem List   Diagnosis Date Noted  . 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 Sickle cell anemia with pain ([place]) 04/24/2020  . Mild intermittent asthma 03/31/2020  . Sickle cell anemia with crisis ([place]) 01/07/2020  . Sickle cell crisis acute chest syndrome ([place]) 07/29/2019  . Drug-seeking behavior 07/07/2019  . Sinus tachycardia 07/07/2019  . Therapeutic opioid-induced constipation (OIC) 07/07/2019  . Breast mass in female 06/29/2019  . Atelectasis of right lung 06/29/2019  . Sickle cell crisis ([place]) 06/27/2019  . Sickle cell pain crisis ([place]) 04/19/2019  . Pneumonia 03/27/2019  . 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 Anterior chest wall pain 11/13/2018  .  Epigastric abdominal pain 11/13/2018  . Hematuria 11/13/2018  . Hyperbilirubinemia 11/12/2018  . Long term current use of anticoagulant therapy 11/12/2018  . History of pulmonary embolism 09/26/2018  . Acute pulmonary embolism ([place]) 09/26/2018  . Opioid dependence ([place]) 09/13/2018  . Port-A-Cath in place 09/13/2018  . Narcotic abuse, continuous ([place]) 08/28/2018  . Anemia 07/03/2018  . Personal history of other venous thrombosis and embolism 07/03/2018  . History of transfusion 07/03/2018  . Gastro-esophageal reflux disease without esophagitis 06/02/2018  . Asthma 05/19/2018  . Anxiety and depression 10/30/2017  . At risk for sepsis 06/13/2017  . Mitral regurgitation 02/01/2014  . Itching 12/13/2013  . Nausea & vomiting 12/13/2013  . 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 Frequent complaints of pain 11/01/2013  . Hypokalemia 09/19/2013  . S/P total hip arthroplasty 05/19/2013  . 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 Lower urinary tract infectious disease 03/02/2013  . Chest pain 11/14/2012  . Sickle-cell anemia ([place]) 10/30/2012  . Pain management 08/01/2012  . Reticulocytosis 07/31/2012  . 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 Essential (hemorrhagic) thrombocythemia ([place]) 04/03/2012  . Leukocytosis 03/27/2012  . History of gestational diabetes 10/07/2011  . 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 CHOLECYSTECTOMY    . ERCP    . JOINT REPLACEMENT    . PORTA CATH  INSERTION    . TUBAL LIGATION    . WISDOM TOOTH EXTRACTION       OB History   No obstetric history on file.     Family History  Problem Relation Age of Onset  . Renal Disease Mother   . Hypertension Mother   . High Cholesterol Mother     Social History   Tobacco Use  . Smoking status: Never Smoker  . Smokeless tobacco: Never  Used  Vaping Use  . Vaping Use: Never used  Substance Use Topics  . Alcohol use: Never  . Drug use: Never    Home Medications Prior to Admission medications   Medication Sig Start Date End Date Taking? Authorizing Provider  albuterol (VENTOLIN HFA) 108 (90 Base) MCG/ACT inhaler Inhale 2 puffs into the lungs 2 (two) times daily as needed for wheezing or shortness of breath. 04/13/20 04/13/21  Vevelyn Francois, NP  budesonide-formoterol (SYMBICORT) 80-4.5 MCG/ACT inhaler Inhale 2 puffs into the lungs 2 (two) times daily. 04/17/20   Vevelyn Francois, NP  celecoxib (CELEBREX) 200 MG capsule Take 1 capsule (200 mg total) by mouth daily. 08/02/20 08/02/21  Vevelyn Francois, NP  Cholecalciferol (VITAMIN D3) 25 MCG (1000 UT) CAPS Take 1 capsule (1,000 Units total) by mouth daily. 04/13/20 04/13/21  Vevelyn Francois, NP  Deferasirox (JADENU) 360 MG TABS Take 3 tablets (1,080 mg total) by mouth daily before breakfast. 04/13/20 04/13/21  Vevelyn Francois, NP  diphenhydrAMINE (BENADRYL) 25 mg capsule Take 25 mg by mouth 3 (three) times daily as needed for itching.    [provider]  ergocalciferol (VITAMIN D2) 1.25 MG (50000 UT) capsule Take 1 capsule (50,000 Units total) by mouth once a week. X 12 weeks. 08/14/20 11/12/20  Vevelyn Francois, NP  folic acid (FOLVITE) 1 MG tablet Take 1 tablet (1 mg total) by mouth daily. 04/13/20 04/13/21  Vevelyn Francois, NP  gabapentin (NEURONTIN) 300 MG capsule Take 1 capsule (300 mg total) by mouth 3 (three) times daily. 04/13/20 04/13/21  Vevelyn Francois, NP  HYDROmorphone (DILAUDID) 4 MG tablet Take 1 tablet (4 mg total) by mouth every 6 (six) hours as needed for up to 15 days for moderate pain or severe pain. 08/23/20 09/07/20  Vevelyn Francois, NP  mirtazapine (REMERON) 45 MG tablet Take 1 tablet (45 mg total) by mouth at bedtime. 04/13/20 04/13/21  Vevelyn Francois, NP  mometasone-formoterol (DULERA) 100-5 MCG/ACT AERO Inhale 2 puffs into the lungs daily as needed for  wheezing or shortness of breath. Patient not taking: Reported on 08/06/2020 04/13/20 04/13/21  Vevelyn Francois, NP  omeprazole (PRILOSEC) 20 MG capsule Take 1 capsule (20 mg total) by mouth 2 (two) times daily before a meal. 08/02/20 08/02/21  Vevelyn Francois, NP  polyethylene glycol (MIRALAX / GLYCOLAX) 17 g packet Take 17 g by mouth daily as needed for mild constipation.    [provider]  promethazine (PHENERGAN) 25 MG tablet Take 0.5-1 tablets (12.5-25 mg total) by mouth every 6 (six) hours as needed for nausea or vomiting. 08/02/20 08/02/21  Vevelyn Francois, NP  vitamin B-12 (CYANOCOBALAMIN) 1000 MCG tablet Take 1 tablet (1,000 mcg total) by mouth daily. 04/13/20 04/13/21  Vevelyn Francois, NP  XARELTO 20 MG TABS tablet Take 1 tablet (20 mg total) by mouth daily with supper. 04/13/20 04/13/21  Vevelyn Francois, NP    Allergies    Cefaclor, Hydroxyurea, and Ketamine  Review of Systems   Review  of Systems  Musculoskeletal: Positive for arthralgias and myalgias.  Neurological: Negative for numbness.  Hematological: Bruises/bleeds easily.  All other systems reviewed and are negative.   Physical Exam Updated Vital Signs BP (!) 112/91 (BP Location: Left Arm)   Pulse (!) 105   Temp 98.7 F (37.1 C) (Oral)   Resp 18   Ht 5\' 3"  (1.6 m)   Wt 59 kg   LMP 07/19/2020   SpO2 94%   BMI 23.03 kg/m   Physical Exam Vitals and nursing note reviewed.  Constitutional:      General: She is not in acute distress.    Appearance: She is well-developed and well-nourished.     Comments: Resting in the bed in no acute distress  HENT:     Head: Normocephalic and atraumatic.  Eyes:     Extraocular Movements: EOM normal.     Conjunctiva/sclera: Conjunctivae normal.     Pupils: Pupils are equal, round, and reactive to light.  Cardiovascular:     Rate and Rhythm: Normal rate and regular rhythm.     Pulses: Normal pulses and intact distal pulses.  Pulmonary:     Effort: Pulmonary effort is  normal. No respiratory distress.     Breath sounds: Normal breath sounds. No wheezing.  Abdominal:     General: There is no distension.     Palpations: Abdomen is soft. There is no mass.     Tenderness: There is no abdominal tenderness. There is no guarding or rebound.  Musculoskeletal:        General: Tenderness present. Normal range of motion.     Cervical back: Normal range of motion and neck supple.     Comments: Tenderness palpation of low back, left greater than right.  Tenderness palpation of the entire left leg.  Pedal pulses 2+ bilaterally.  Good distal sensation.  Skin:    General: Skin is warm and dry.     Capillary Refill: Capillary refill takes less than 2 seconds.  Neurological:     Mental Status: She is alert and oriented to person, place, and time.  Psychiatric:        Mood and Affect: Mood and affect normal.     ED Results / Procedures / Treatments   Labs (all labs ordered are listed, but only abnormal results are displayed) Labs Reviewed - No data to display  EKG None  Radiology No results found.  Procedures Procedures   Medications Ordered in ED Medications - No data to display  ED Course  I have reviewed the triage vital signs and the nursing notes.  Pertinent labs & imaging results that were available during my care of the patient were reviewed by me and considered in my medical decision making (see chart for details).    MDM Rules/Calculators/A&P                          Patient presented for evaluation of low back and left leg pain.  On exam, patient peers nontoxic.  She is neurovascularly intact.  As she is on a blood thinner, doubt DVT.  Consider MSK/sciatic cause.  However patient states this is consistent with some of her previous sickle cell crises.  Consider this as cause.  Discussed with Berle Mull from sickle cell infusion clinic, recommends patient be transported over for treatment.  Final Clinical Impression(s) / ED Diagnoses Final  diagnoses:  Sickle cell pain crisis ([place])    Rx / DC Orders  ED Discharge Orders    None       Franchot Heidelberg, PA-C 08/23/20 1320    Blanchie Dessert, MD 08/23/20 2130

## 2020-08-24 ENCOUNTER — Telehealth: Payer: Self-pay

## 2020-08-24 NOTE — Telephone Encounter (Signed)
Hydromorpine 4 mg  [facility] in [place]

## 2020-08-29 NOTE — Telephone Encounter (Signed)
Error

## 2020-09-01 ENCOUNTER — Telehealth: Payer: Self-pay

## 2020-09-01 ENCOUNTER — Other Ambulatory Visit: Payer: Self-pay | Admitting: Nurse Practitioner

## 2020-09-01 MED ORDER — HYDROMORPHONE HCL 4 MG PO TABS: 4.0000 mg | ORAL_TABLET | Freq: Four times a day (QID) | ORAL | 0 refills | Status: DC | PRN

## 2020-09-01 NOTE — Telephone Encounter (Signed)
Med refill on hydromorphone 4 mg

## 2020-09-01 NOTE — Telephone Encounter (Signed)
sent 

## 2020-09-21 ENCOUNTER — Other Ambulatory Visit: Payer: Self-pay

## 2020-09-21 ENCOUNTER — Emergency Department (HOSPITAL_COMMUNITY): Payer: Medicare Other

## 2020-09-21 ENCOUNTER — Encounter (HOSPITAL_COMMUNITY): Payer: Self-pay

## 2020-09-21 ENCOUNTER — Emergency Department (HOSPITAL_COMMUNITY)
Admission: EM | Admit: 2020-09-21 | Discharge: 2020-09-22 | Disposition: A | Discharge status: Home / Self Care | Payer: Medicare Other | Attending: Emergency Medicine | Admitting: Emergency Medicine

## 2020-09-21 DIAGNOSIS — J4520 Mild intermittent asthma, uncomplicated: Secondary | ICD-10-CM | POA: Insufficient documentation

## 2020-09-21 DIAGNOSIS — D5700 Hb-SS disease with crisis, unspecified: Secondary | ICD-10-CM | POA: Insufficient documentation

## 2020-09-21 DIAGNOSIS — R079 Chest pain, unspecified: Secondary | ICD-10-CM | POA: Diagnosis present

## 2020-09-21 DIAGNOSIS — Z7951 Long term (current) use of inhaled steroids: Secondary | ICD-10-CM | POA: Diagnosis not present

## 2020-09-21 LAB — CBC WITH DIFFERENTIAL/PLATELET
Abs Immature Granulocytes: 0.07 10*3/uL (ref 0.00–0.07)
Basophils Absolute: 0.1 10*3/uL (ref 0.0–0.1)
Basophils Relative: 1 %
Eosinophils Absolute: 0.2 10*3/uL (ref 0.0–0.5)
Eosinophils Relative: 1 %
HCT: 24.4 % — ABNORMAL LOW (ref 36.0–46.0)
Hemoglobin: 8.2 g/dL — ABNORMAL LOW (ref 12.0–15.0)
Immature Granulocytes: 1 %
Lymphocytes Relative: 19 %
Lymphs Abs: 2.9 10*3/uL (ref 0.7–4.0)
MCH: 28.4 pg (ref 26.0–34.0)
MCHC: 33.6 g/dL (ref 30.0–36.0)
MCV: 84.4 fL (ref 80.0–100.0)
Monocytes Absolute: 2.4 10*3/uL — ABNORMAL HIGH (ref 0.1–1.0)
Monocytes Relative: 16 %
Neutro Abs: 9.6 10*3/uL — ABNORMAL HIGH (ref 1.7–7.7)
Neutrophils Relative %: 62 %
Platelets: 684 10*3/uL — ABNORMAL HIGH (ref 150–400)
RBC: 2.89 MIL/uL — ABNORMAL LOW (ref 3.87–5.11)
RDW: 18.6 % — ABNORMAL HIGH (ref 11.5–15.5)
WBC: 15.3 10*3/uL — ABNORMAL HIGH (ref 4.0–10.5)
nRBC: 0.3 % — ABNORMAL HIGH (ref 0.0–0.2)

## 2020-09-21 LAB — BASIC METABOLIC PANEL
Anion gap: 8 (ref 5–15)
BUN: 9 mg/dL (ref 6–20)
CO2: 23 mmol/L (ref 22–32)
Calcium: 9.1 mg/dL (ref 8.9–10.3)
Chloride: 107 mmol/L (ref 98–111)
Creatinine, Ser: 0.54 mg/dL (ref 0.44–1.00)
GFR, Estimated: >60 mL/min (ref >60)
Glucose, Bld: 76 mg/dL (ref 70–99)
Potassium: 3.7 mmol/L (ref 3.5–5.1)
Sodium: 138 mmol/L (ref 135–145)

## 2020-09-21 LAB — RETICULOCYTES
Immature Retic Fract: 14.1 % (ref 2.3–15.9)
RBC.: 2.74 MIL/uL — ABNORMAL LOW (ref 3.87–5.11)
Retic Count, Absolute: 231 10*3/uL — ABNORMAL HIGH (ref 19.0–186.0)
Retic Ct Pct: 8.6 % — ABNORMAL HIGH (ref 0.4–3.1)

## 2020-09-21 LAB — I-STAT BETA HCG BLOOD, ED (MC, WL, AP ONLY): I-stat hCG, quantitative: <5 m[IU]/mL (ref <5)

## 2020-09-21 MED ORDER — SODIUM CHLORIDE 0.9 % IV SOLN
25.0000 mg | Freq: Once | INTRAVENOUS | Status: AC
  Administered 2020-09-22: 25 mg via INTRAVENOUS
  Filled 2020-09-21: qty 1

## 2020-09-21 MED ORDER — HYDROMORPHONE HCL 2 MG/ML IJ SOLN
2.0000 mg | INTRAMUSCULAR | Status: AC
  Administered 2020-09-21: 2 mg via INTRAVENOUS
  Filled 2020-09-21: qty 1

## 2020-09-21 MED ORDER — DEXTROSE-NACL 5-0.45 % IV SOLN: INTRAVENOUS | Status: DC

## 2020-09-21 MED ORDER — MORPHINE SULFATE (PF) 4 MG/ML IV SOLN: 10.0000 mg | Freq: Once | INTRAVENOUS | Status: DC

## 2020-09-21 MED ORDER — MORPHINE SULFATE (PF) 4 MG/ML IV SOLN: 8.0000 mg | INTRAVENOUS | Status: DC

## 2020-09-21 NOTE — ED Provider Notes (Signed)
Care assumed from Dr. Tamera Punt, patient with sickle cell crisis undergoing sickle cell pain crisis protocol.  Following second injection of hydromorphone, patient continued to complain of significant pain.  She is given a third dose of hydromorphone.  Following this, she felt that she was doing well enough to go home although she was complaining of some itching and was given a dose of diphenhydramine.  She is discharged with instructions to follow-up with PCP, return to the emergency department if pain is not being adequately controlled at home or if she develops a fever.   Delora Fuel, MD 95/28/41 [facility] [place]

## 2020-09-21 NOTE — ED Triage Notes (Signed)
Pt to ED by POV from home with c/o sickle cell crisis. Pt c/o generalized CP that began yesterday. Arrives A+O, VSS.

## 2020-09-21 NOTE — ED Provider Notes (Signed)
[facility] DEPT Provider Note   CSN: [id number] Arrival date & time: 09/21/20  1923     History Chief Complaint  Patient presents with  . Sickle Cell Pain Crisis    Molly Gutierrez is a 40 y.o. female.  Patient is a 40 year old female with a history of sickle cell anemia who presents with a sickle cell pain crises.  She describes a diffuse chest pain which started yesterday.  This is her typical sickle cell pain.  She denies any other joint pains.  No fevers.  No cough although she says she feels a little congested.  She denies any shortness of breath.  No leg pain or swelling.  She is taking her typical home medications without improvement.        Past Medical History:  Diagnosis Date  . Asthma   . Eczema   . History of pulmonary embolus (PE)   . Sickle cell anemia [facility])     Patient Active Problem List   Diagnosis Date Noted  . 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 Sickle cell anemia with pain ([place]) 04/24/2020  . Mild intermittent asthma 03/31/2020  . Sickle cell anemia with crisis ([place]) 01/07/2020  . Sickle cell crisis acute chest syndrome ([place]) 07/29/2019  . Drug-seeking behavior 07/07/2019  . Sinus tachycardia 07/07/2019  . Therapeutic opioid-induced constipation (OIC) 07/07/2019  . Breast mass in female 06/29/2019  . Atelectasis of right lung 06/29/2019  . Sickle cell crisis ([place]) 06/27/2019  . Sickle cell pain crisis ([place]) 04/19/2019  . Pneumonia 03/27/2019  . 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 Anterior chest wall pain 11/13/2018  . Epigastric abdominal pain 11/13/2018  . Hematuria 11/13/2018  . Hyperbilirubinemia 11/12/2018  . Long term current use of anticoagulant therapy 11/12/2018  . History of pulmonary embolism 09/26/2018  . Acute pulmonary embolism ([place]) 09/26/2018  . Opioid dependence ([place]) 09/13/2018  . Port-A-Cath in place 09/13/2018  . Narcotic abuse, continuous ([place]) 08/28/2018  . Anemia 07/03/2018  .  Personal history of other venous thrombosis and embolism 07/03/2018  . History of transfusion 07/03/2018  . Gastro-esophageal reflux disease without esophagitis 06/02/2018  . Asthma 05/19/2018  . Anxiety and depression 10/30/2017  . At risk for sepsis 06/13/2017  . Mitral regurgitation 02/01/2014  . Itching 12/13/2013  . Nausea & vomiting 12/13/2013  . 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 Frequent complaints of pain 11/01/2013  . Hypokalemia 09/19/2013  . S/P total hip arthroplasty 05/19/2013  . 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 Lower urinary tract infectious disease 03/02/2013  . Chest pain 11/14/2012  . Sickle-cell anemia ([place]) 10/30/2012  . Pain management 08/01/2012  . Reticulocytosis 07/31/2012  . 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 Leukocytosis 03/27/2012  . History of gestational diabetes 10/07/2011  . 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 CHOLECYSTECTOMY    . ERCP    . JOINT REPLACEMENT    . PORTA CATH INSERTION    . TUBAL LIGATION    . WISDOM TOOTH EXTRACTION       OB History   No obstetric history on file.     Family History  Problem Relation Age of Onset  . Renal Disease Mother   . Hypertension Mother   . High Cholesterol Mother     Social History   Tobacco Use  .  Smoking status: Never Smoker  . Smokeless tobacco: Never Used  Vaping Use  . Vaping Use: Never used  Substance Use Topics  . Alcohol use: Never  . Drug use: Never    Home Medications Prior to Admission medications   Medication Sig Start Date End Date Taking? Authorizing Provider  albuterol (VENTOLIN HFA) 108 (90 Base) MCG/ACT inhaler Inhale 2 puffs into the lungs 2 (two) times daily as needed for wheezing or shortness of breath. 04/13/20  04/13/21 Yes King, Diona Foley, NP  budesonide-formoterol (SYMBICORT) 80-4.5 MCG/ACT inhaler Inhale 2 puffs into the lungs 2 (two) times daily. 04/17/20  Yes Vevelyn Francois, NP  celecoxib (CELEBREX) 200 MG capsule Take 1 capsule (200 mg total) by mouth daily. 08/02/20 08/02/21 Yes Vevelyn Francois, NP  Cholecalciferol (VITAMIN D3) 25 MCG (1000 UT) CAPS Take 1 capsule (1,000 Units total) by mouth daily. 04/13/20 04/13/21 Yes Vevelyn Francois, NP  Deferasirox (JADENU) 360 MG TABS Take 3 tablets (1,080 mg total) by mouth daily before breakfast. 04/13/20 04/13/21 Yes Vevelyn Francois, NP  ergocalciferol (VITAMIN D2) 1.25 MG (50000 UT) capsule Take 1 capsule (50,000 Units total) by mouth once a week. X 12 weeks. 08/14/20 11/12/20 Yes Vevelyn Francois, NP  folic acid (FOLVITE) 1 MG tablet Take 1 tablet (1 mg total) by mouth daily. 04/13/20 04/13/21 Yes Vevelyn Francois, NP  gabapentin (NEURONTIN) 300 MG capsule Take 1 capsule (300 mg total) by mouth 3 (three) times daily. 04/13/20 04/13/21 Yes King, Diona Foley, NP  HYDROmorphone (DILAUDID) 4 MG tablet Take 1 tablet (4 mg total) by mouth every 6 (six) hours as needed for up to 15 days for moderate pain or severe pain. 09/08/20 09/23/20 Yes Vevelyn Francois, NP  mirtazapine (REMERON) 45 MG tablet Take 1 tablet (45 mg total) by mouth at bedtime. 04/13/20 04/13/21 Yes King, Diona Foley, NP  mometasone-formoterol (DULERA) 100-5 MCG/ACT AERO Inhale 2 puffs into the lungs daily as needed for wheezing or shortness of breath. 04/13/20 04/13/21 Yes Vevelyn Francois, NP  omeprazole (PRILOSEC) 20 MG capsule Take 1 capsule (20 mg total) by mouth 2 (two) times daily before a meal. 08/02/20 08/02/21 Yes King, Diona Foley, NP  polyethylene glycol (MIRALAX / GLYCOLAX) 17 g packet Take 17 g by mouth daily as needed for mild constipation.   Yes [provider]  promethazine (PHENERGAN) 25 MG tablet Take 0.5-1 tablets (12.5-25 mg total) by mouth every 6 (six) hours as needed for nausea or  vomiting. 08/02/20 08/02/21 Yes Vevelyn Francois, NP  vitamin B-12 (CYANOCOBALAMIN) 1000 MCG tablet Take 1 tablet (1,000 mcg total) by mouth daily. 04/13/20 04/13/21 Yes King, Diona Foley, NP  voxelotor (OXBRYTA) 500 MG TABS tablet Take 500 mg by mouth daily. 09/13/20 10/13/20 Yes [provider]  XARELTO 20 MG TABS tablet Take 1 tablet (20 mg total) by mouth daily with supper. 04/13/20 04/13/21 Yes Vevelyn Francois, NP  diphenhydrAMINE (BENADRYL) 25 mg capsule Take 25 mg by mouth 3 (three) times daily as needed for itching.    [provider]    Allergies    Cefaclor, Hydroxyurea, and Ketamine  Review of Systems   Review of Systems  Constitutional: Negative for chills, diaphoresis, fatigue and fever.  HENT: Positive for congestion. Negative for rhinorrhea and sneezing.   Eyes: Negative.   Respiratory: Negative for cough, chest tightness and shortness of breath.   Cardiovascular: Positive for chest pain. Negative for leg swelling.  Gastrointestinal: Positive for nausea. Negative for abdominal  pain, blood in stool, diarrhea and vomiting.  Genitourinary: Negative for difficulty urinating, flank pain, frequency and hematuria.  Musculoskeletal: Negative for arthralgias and back pain.  Skin: Negative for rash.  Neurological: Negative for dizziness, speech difficulty, weakness, numbness and headaches.    Physical Exam Updated Vital Signs BP 107/85   Pulse 89   Temp 98.7 F (37.1 C) (Oral)   Resp 15   Ht 5\' 3"  (1.6 m)   Wt 57 kg   LMP 09/06/2020 (Approximate)   SpO2 97%   BMI 22.26 kg/m   Physical Exam Constitutional:      Appearance: She is well-developed.  HENT:     Head: Normocephalic and atraumatic.  Eyes:     Pupils: Pupils are equal, round, and reactive to light.  Cardiovascular:     Rate and Rhythm: Normal rate and regular rhythm.     Heart sounds: Normal heart sounds.  Pulmonary:     Effort: Pulmonary effort is normal. No respiratory distress.     Breath  sounds: Normal breath sounds. No wheezing or rales.  Chest:     Chest wall: Tenderness (Diffuse tenderness on palpation of chest wall, no crepitus or deformity) present.  Abdominal:     General: Bowel sounds are normal.     Palpations: Abdomen is soft.     Tenderness: There is no abdominal tenderness. There is no guarding or rebound.  Musculoskeletal:        General: Normal range of motion.     Cervical back: Normal range of motion and neck supple.     Comments: No edema or calf tenderness  Lymphadenopathy:     Cervical: No cervical adenopathy.  Skin:    General: Skin is warm and dry.     Findings: No rash.  Neurological:     Mental Status: She is alert and oriented to person, place, and time.     ED Results / Procedures / Treatments   Labs (all labs ordered are listed, but only abnormal results are displayed) Labs Reviewed  RETICULOCYTES - Abnormal; Notable for the following components:      Result Value   Retic Ct Pct 8.6 (*)    RBC. 2.74 (*)    Retic Count, Absolute 231.0 (*)    All other components within normal limits  CBC WITH DIFFERENTIAL/PLATELET - Abnormal; Notable for the following components:   WBC 15.3 (*)    RBC 2.89 (*)    Hemoglobin 8.2 (*)    HCT 24.4 (*)    RDW 18.6 (*)    Platelets 684 (*)    nRBC 0.3 (*)    Neutro Abs 9.6 (*)    Monocytes Absolute 2.4 (*)    All other components within normal limits  BASIC METABOLIC PANEL  I-STAT BETA HCG BLOOD, ED ([facility], [facility], [facility] ONLY)    EKG EKG Interpretation  Date/Time:  Thursday September 21 2020 20:10:44 EDT Ventricular Rate:  98 PR Interval:  146 QRS Duration: 87 QT Interval:  368 QTC Calculation: 470 R Axis:   57 Text Interpretation: Sinus rhythm Borderline T abnormalities, anterior leads since last tracing no significant change Confirmed by Malvin Johns [phone]) on 09/21/2020 9:38:21 PM   Radiology DG Chest 2 View  Result Date: 09/21/2020 CLINICAL DATA:  Chest pain.  Sickle cell crisis. EXAM: CHEST - 2  VIEW COMPARISON:  August 05, 2020 FINDINGS: Stable bilateral venous Port-A-Cath positioning is noted. There is no evidence of acute infiltrate, pleural effusion or pneumothorax. The heart size and  mediastinal contours are within normal limits. Radiopaque surgical clips are seen within the right upper quadrant. The visualized skeletal structures are unremarkable. IMPRESSION: No active cardiopulmonary disease. Electronically Signed   By: Virgina Norfolk M.D.   On: 09/21/2020 20:04    Procedures Procedures   Medications Ordered in ED Medications  dextrose 5 %-0.45 % sodium chloride infusion ( Intravenous New Bag/Given 09/21/20 2148)  HYDROmorphone (DILAUDID) injection 2 mg (2 mg Intravenous Given 09/21/20 2140)  HYDROmorphone (DILAUDID) injection 2 mg (2 mg Intravenous Given 09/21/20 2250)    ED Course  I have reviewed the triage vital signs and the nursing notes.  Pertinent labs & imaging results that were available during my care of the patient were reviewed by me and considered in my medical decision making (see chart for details).    MDM Rules/Calculators/A&P                         Patient is a 40 year old female who presents with chest pain consistent with her prior sickle cell pain crises.  She is afebrile.  She is not hypoxic.  She does not report any shortness of breath.  No symptoms that would be more suggestive of PE.  Chest x-ray shows no evidence of acute chest syndrome.  This was interpreted by me.  Her labs are nonconcerning.  She has an elevation in her WBC count which appears to be chronic.  Her pain has improved after her first dose of Dilaudid.  She is awaiting her second dose.  If her pain is well controlled, she can be discharged home, if not she can be admitted for further pain control.  Dr. Roxanne Mins to take over care.   Final Clinical Impression(s) / ED Diagnoses Final diagnoses:  Sickle cell pain crisis [facility])    Rx / DC Orders ED Discharge Orders    None        Malvin Johns, MD 09/21/20 2259

## 2020-09-22 ENCOUNTER — Other Ambulatory Visit: Payer: Self-pay | Admitting: Nurse Practitioner

## 2020-09-22 ENCOUNTER — Telehealth: Payer: Self-pay

## 2020-09-22 DIAGNOSIS — D5700 Hb-SS disease with crisis, unspecified: Secondary | ICD-10-CM | POA: Diagnosis not present

## 2020-09-22 MED ORDER — DIPHENHYDRAMINE HCL 50 MG/ML IJ SOLN
25.0000 mg | Freq: Once | INTRAMUSCULAR | Status: AC
  Administered 2020-09-22: 25 mg via INTRAVENOUS
  Filled 2020-09-22: qty 1

## 2020-09-22 MED ORDER — HYDROMORPHONE HCL 2 MG/ML IJ SOLN
2.0000 mg | INTRAMUSCULAR | Status: AC
  Administered 2020-09-22: 2 mg via INTRAVENOUS
  Filled 2020-09-22: qty 1

## 2020-09-22 MED ORDER — HYDROMORPHONE HCL 4 MG PO TABS: 4.0000 mg | ORAL_TABLET | Freq: Four times a day (QID) | ORAL | 0 refills | Status: DC | PRN

## 2020-09-22 MED ORDER — HEPARIN SOD (PORK) LOCK FLUSH 100 UNIT/ML IV SOLN
500.0000 [IU] | Freq: Once | INTRAVENOUS | Status: AC
  Administered 2020-09-22: 500 [IU]
  Filled 2020-09-22: qty 5

## 2020-09-22 NOTE — Discharge Instructions (Addendum)
Continue taking your pain medication at home.  Return to the emergency department if pain is not being adequately controlled at home, or if you start running a fever.

## 2020-09-22 NOTE — Telephone Encounter (Signed)
sent 

## 2020-09-22 NOTE — Telephone Encounter (Signed)
Hydromorphine refill

## 2020-09-25 ENCOUNTER — Telehealth: Payer: Self-pay

## 2020-09-25 NOTE — Telephone Encounter (Signed)
Hydromorephone 4 mg  [facility] [address] in [place]

## 2020-09-27 NOTE — Telephone Encounter (Signed)
Please contact to see if she picked up her Rx on 09/23/20?

## 2020-09-27 NOTE — Telephone Encounter (Signed)
Patient pick up hydromorphone today.

## 2020-10-02 ENCOUNTER — Ambulatory Visit: Payer: Medicare Other | Admitting: Nurse Practitioner

## 2020-10-04 ENCOUNTER — Telehealth: Payer: Medicare Other | Admitting: Nurse Practitioner

## 2020-10-04 ENCOUNTER — Other Ambulatory Visit: Payer: Self-pay

## 2020-10-05 ENCOUNTER — Telehealth: Payer: Self-pay

## 2020-10-05 ENCOUNTER — Other Ambulatory Visit: Payer: Self-pay

## 2020-10-05 ENCOUNTER — Encounter (HOSPITAL_COMMUNITY): Payer: Self-pay

## 2020-10-05 ENCOUNTER — Inpatient Hospital Stay (HOSPITAL_COMMUNITY)
Admission: EM | Admit: 2020-10-05 | Discharge: 2020-10-10 | DRG: 812 | Disposition: A | Discharge status: Home / Self Care | Payer: Medicare Other | Attending: Internal Medicine | Admitting: Internal Medicine

## 2020-10-05 DIAGNOSIS — F418 Other specified anxiety disorders: Secondary | ICD-10-CM | POA: Diagnosis present

## 2020-10-05 DIAGNOSIS — Z79899 Other long term (current) drug therapy: Secondary | ICD-10-CM | POA: Diagnosis not present

## 2020-10-05 DIAGNOSIS — D638 Anemia in other chronic diseases classified elsewhere: Secondary | ICD-10-CM | POA: Diagnosis present

## 2020-10-05 DIAGNOSIS — F1120 Opioid dependence, uncomplicated: Secondary | ICD-10-CM | POA: Diagnosis present

## 2020-10-05 DIAGNOSIS — Z8249 Family history of ischemic heart disease and other diseases of the circulatory system: Secondary | ICD-10-CM | POA: Diagnosis not present

## 2020-10-05 DIAGNOSIS — Z83438 Family history of other disorder of lipoprotein metabolism and other lipidemia: Secondary | ICD-10-CM

## 2020-10-05 DIAGNOSIS — J4520 Mild intermittent asthma, uncomplicated: Secondary | ICD-10-CM | POA: Diagnosis present

## 2020-10-05 DIAGNOSIS — E876 Hypokalemia: Secondary | ICD-10-CM | POA: Diagnosis present

## 2020-10-05 DIAGNOSIS — D72829 Elevated white blood cell count, unspecified: Secondary | ICD-10-CM | POA: Diagnosis present

## 2020-10-05 DIAGNOSIS — Z20822 Contact with and (suspected) exposure to covid-19: Secondary | ICD-10-CM | POA: Diagnosis present

## 2020-10-05 DIAGNOSIS — Z7951 Long term (current) use of inhaled steroids: Secondary | ICD-10-CM

## 2020-10-05 DIAGNOSIS — D5700 Hb-SS disease with crisis, unspecified: Principal | ICD-10-CM | POA: Diagnosis present

## 2020-10-05 DIAGNOSIS — Z86711 Personal history of pulmonary embolism: Secondary | ICD-10-CM

## 2020-10-05 DIAGNOSIS — F32A Depression, unspecified: Secondary | ICD-10-CM | POA: Diagnosis present

## 2020-10-05 DIAGNOSIS — Z7901 Long term (current) use of anticoagulants: Secondary | ICD-10-CM

## 2020-10-05 DIAGNOSIS — Z841 Family history of disorders of kidney and ureter: Secondary | ICD-10-CM

## 2020-10-05 DIAGNOSIS — Z888 Allergy status to other drugs, medicaments and biological substances status: Secondary | ICD-10-CM | POA: Diagnosis not present

## 2020-10-05 DIAGNOSIS — D75838 Other thrombocytosis: Secondary | ICD-10-CM | POA: Diagnosis present

## 2020-10-05 DIAGNOSIS — G894 Chronic pain syndrome: Secondary | ICD-10-CM | POA: Diagnosis present

## 2020-10-05 DIAGNOSIS — I2699 Other pulmonary embolism without acute cor pulmonale: Secondary | ICD-10-CM | POA: Diagnosis present

## 2020-10-05 DIAGNOSIS — F419 Anxiety disorder, unspecified: Secondary | ICD-10-CM | POA: Diagnosis present

## 2020-10-05 LAB — CBC WITH DIFFERENTIAL/PLATELET
Abs Immature Granulocytes: 0.14 10*3/uL — ABNORMAL HIGH (ref 0.00–0.07)
Basophils Absolute: 0 10*3/uL (ref 0.0–0.1)
Basophils Relative: 0 %
Eosinophils Absolute: 0.2 10*3/uL (ref 0.0–0.5)
Eosinophils Relative: 1 %
HCT: 22.9 % — ABNORMAL LOW (ref 36.0–46.0)
Hemoglobin: 7.4 g/dL — ABNORMAL LOW (ref 12.0–15.0)
Immature Granulocytes: 1 %
Lymphocytes Relative: 18 %
Lymphs Abs: 2.5 10*3/uL (ref 0.7–4.0)
MCH: 28.1 pg (ref 26.0–34.0)
MCHC: 32.3 g/dL (ref 30.0–36.0)
MCV: 87.1 fL (ref 80.0–100.0)
Monocytes Absolute: 2.3 10*3/uL — ABNORMAL HIGH (ref 0.1–1.0)
Monocytes Relative: 16 %
Neutro Abs: 9.3 10*3/uL — ABNORMAL HIGH (ref 1.7–7.7)
Neutrophils Relative %: 64 %
Platelets: 619 10*3/uL — ABNORMAL HIGH (ref 150–400)
RBC: 2.63 MIL/uL — ABNORMAL LOW (ref 3.87–5.11)
RDW: 21 % — ABNORMAL HIGH (ref 11.5–15.5)
WBC: 14.5 10*3/uL — ABNORMAL HIGH (ref 4.0–10.5)
nRBC: 3 % — ABNORMAL HIGH (ref 0.0–0.2)

## 2020-10-05 LAB — COMPREHENSIVE METABOLIC PANEL
ALT: 16 U/L (ref 0–44)
AST: 27 U/L (ref 15–41)
Albumin: 3.7 g/dL (ref 3.5–5.0)
Alkaline Phosphatase: 51 U/L (ref 38–126)
Anion gap: 6 (ref 5–15)
BUN: 6 mg/dL (ref 6–20)
CO2: 22 mmol/L (ref 22–32)
Calcium: 7.4 mg/dL — ABNORMAL LOW (ref 8.9–10.3)
Chloride: 110 mmol/L (ref 98–111)
Creatinine, Ser: 0.46 mg/dL (ref 0.44–1.00)
GFR, Estimated: >60 mL/min (ref >60)
Glucose, Bld: 78 mg/dL (ref 70–99)
Potassium: 3.1 mmol/L — ABNORMAL LOW (ref 3.5–5.1)
Sodium: 138 mmol/L (ref 135–145)
Total Bilirubin: 3 mg/dL — ABNORMAL HIGH (ref 0.3–1.2)
Total Protein: 6.7 g/dL (ref 6.5–8.1)

## 2020-10-05 LAB — I-STAT BETA HCG BLOOD, ED (MC, WL, AP ONLY): I-stat hCG, quantitative: <5 m[IU]/mL (ref <5)

## 2020-10-05 LAB — RETICULOCYTES: RBC.: 2.61 MIL/uL — ABNORMAL LOW (ref 3.87–5.11)

## 2020-10-05 MED ORDER — HYDROMORPHONE 1 MG/ML IV SOLN
INTRAVENOUS | Status: DC
  Administered 2020-10-05: 30 mg via INTRAVENOUS
  Administered 2020-10-06: 3.5 mg via INTRAVENOUS
  Administered 2020-10-06: 5.5 mg via INTRAVENOUS
  Administered 2020-10-06: 4 mg via INTRAVENOUS
  Administered 2020-10-06: 3 mg via INTRAVENOUS
  Administered 2020-10-06: 3.5 mg via INTRAVENOUS
  Administered 2020-10-06: 4 mg via INTRAVENOUS
  Administered 2020-10-07: 3.5 mg via INTRAVENOUS
  Administered 2020-10-07: 30 mg via INTRAVENOUS
  Administered 2020-10-07: 4.5 mg via INTRAVENOUS
  Administered 2020-10-07: 6.5 mg via INTRAVENOUS
  Administered 2020-10-07: 6 mg via INTRAVENOUS
  Administered 2020-10-07: 1.5 mg via INTRAVENOUS
  Administered 2020-10-08: 30 mg via INTRAVENOUS
  Administered 2020-10-08: 4.5 mg via INTRAVENOUS
  Administered 2020-10-08: 30 mg via INTRAVENOUS
  Administered 2020-10-08: 3 mg via INTRAVENOUS
  Administered 2020-10-08: 6.5 mg via INTRAVENOUS
  Administered 2020-10-08: 5.5 mg via INTRAVENOUS
  Administered 2020-10-08: 4 mg via INTRAVENOUS
  Administered 2020-10-08: 4.5 mg via INTRAVENOUS
  Administered 2020-10-09: 2.5 mg via INTRAVENOUS
  Administered 2020-10-09: 0.5 mg via INTRAVENOUS
  Administered 2020-10-09: 6 mg via INTRAVENOUS
  Filled 2020-10-05 (×4): qty 30

## 2020-10-05 MED ORDER — VITAMIN B-12 1000 MCG PO TABS
1000.0000 ug | ORAL_TABLET | Freq: Every day | ORAL | Status: DC
  Administered 2020-10-06 – 2020-10-10 (×5): 1000 ug via ORAL
  Filled 2020-10-05 (×5): qty 1

## 2020-10-05 MED ORDER — HYDROMORPHONE HCL 2 MG/ML IJ SOLN
2.0000 mg | INTRAMUSCULAR | Status: AC
  Administered 2020-10-05: 2 mg via SUBCUTANEOUS
  Filled 2020-10-05: qty 1

## 2020-10-05 MED ORDER — MOMETASONE FURO-FORMOTEROL FUM 100-5 MCG/ACT IN AERO
2.0000 | INHALATION_SPRAY | Freq: Two times a day (BID) | RESPIRATORY_TRACT | Status: DC
  Filled 2020-10-05: qty 8.8

## 2020-10-05 MED ORDER — VOXELOTOR 500 MG PO TABS: 500.0000 mg | ORAL_TABLET | Freq: Every day | ORAL | Status: DC

## 2020-10-05 MED ORDER — DIPHENHYDRAMINE HCL 50 MG/ML IJ SOLN
25.0000 mg | Freq: Once | INTRAMUSCULAR | Status: AC
  Administered 2020-10-05: 25 mg via INTRAVENOUS
  Filled 2020-10-05: qty 1

## 2020-10-05 MED ORDER — SENNOSIDES-DOCUSATE SODIUM 8.6-50 MG PO TABS
1.0000 | ORAL_TABLET | Freq: Two times a day (BID) | ORAL | Status: DC
  Administered 2020-10-05 – 2020-10-09 (×9): 1 via ORAL
  Filled 2020-10-05 (×10): qty 1

## 2020-10-05 MED ORDER — ALBUTEROL SULFATE HFA 108 (90 BASE) MCG/ACT IN AERS: 2.0000 | INHALATION_SPRAY | Freq: Two times a day (BID) | RESPIRATORY_TRACT | Status: DC | PRN

## 2020-10-05 MED ORDER — ONDANSETRON HCL 4 MG/2ML IJ SOLN: 4.0000 mg | Freq: Four times a day (QID) | INTRAMUSCULAR | Status: DC | PRN

## 2020-10-05 MED ORDER — MOMETASONE FURO-FORMOTEROL FUM 100-5 MCG/ACT IN AERO
2.0000 | INHALATION_SPRAY | Freq: Two times a day (BID) | RESPIRATORY_TRACT | Status: DC
  Administered 2020-10-06 – 2020-10-08 (×6): 2 via RESPIRATORY_TRACT

## 2020-10-05 MED ORDER — GABAPENTIN 300 MG PO CAPS
300.0000 mg | ORAL_CAPSULE | Freq: Three times a day (TID) | ORAL | Status: DC
  Administered 2020-10-05 – 2020-10-10 (×14): 300 mg via ORAL
  Filled 2020-10-05 (×14): qty 1

## 2020-10-05 MED ORDER — DIPHENHYDRAMINE HCL 25 MG PO CAPS: 25.0000 mg | ORAL_CAPSULE | ORAL | Status: DC | PRN

## 2020-10-05 MED ORDER — CELECOXIB 200 MG PO CAPS
200.0000 mg | ORAL_CAPSULE | Freq: Every day | ORAL | Status: DC
  Administered 2020-10-06 – 2020-10-10 (×5): 200 mg via ORAL
  Filled 2020-10-05 (×5): qty 1

## 2020-10-05 MED ORDER — PANTOPRAZOLE SODIUM 40 MG PO TBEC
40.0000 mg | DELAYED_RELEASE_TABLET | Freq: Every day | ORAL | Status: DC
  Administered 2020-10-06 – 2020-10-10 (×5): 40 mg via ORAL
  Filled 2020-10-05 (×5): qty 1

## 2020-10-05 MED ORDER — SODIUM CHLORIDE 0.9 % IV SOLN
12.5000 mg | Freq: Four times a day (QID) | INTRAVENOUS | Status: DC | PRN
  Administered 2020-10-05: 12.5 mg via INTRAVENOUS
  Filled 2020-10-05: qty 12.5

## 2020-10-05 MED ORDER — PROMETHAZINE HCL 25 MG PO TABS: 12.5000 mg | ORAL_TABLET | Freq: Four times a day (QID) | ORAL | Status: DC | PRN

## 2020-10-05 MED ORDER — FOLIC ACID 1 MG PO TABS
1.0000 mg | ORAL_TABLET | Freq: Every day | ORAL | Status: DC
  Administered 2020-10-06 – 2020-10-10 (×5): 1 mg via ORAL
  Filled 2020-10-05 (×5): qty 1

## 2020-10-05 MED ORDER — VITAMIN D3 25 MCG (1000 UNIT) PO TABS
1000.0000 [IU] | ORAL_TABLET | Freq: Every day | ORAL | Status: DC
  Administered 2020-10-06 – 2020-10-10 (×5): 1000 [IU] via ORAL
  Filled 2020-10-05 (×5): qty 1

## 2020-10-05 MED ORDER — SODIUM CHLORIDE 0.9% FLUSH: 9.0000 mL | INTRAVENOUS | Status: DC | PRN

## 2020-10-05 MED ORDER — MIRTAZAPINE 30 MG PO TABS
45.0000 mg | ORAL_TABLET | Freq: Every day | ORAL | Status: DC
  Administered 2020-10-05 – 2020-10-09 (×5): 45 mg via ORAL
  Filled 2020-10-05 (×5): qty 1

## 2020-10-05 MED ORDER — POLYETHYLENE GLYCOL 3350 17 G PO PACK: 17.0000 g | PACK | Freq: Every day | ORAL | Status: DC | PRN

## 2020-10-05 MED ORDER — NALOXONE HCL 0.4 MG/ML IJ SOLN: 0.4000 mg | INTRAMUSCULAR | Status: DC | PRN

## 2020-10-05 MED ORDER — DIPHENHYDRAMINE HCL 25 MG PO CAPS
25.0000 mg | ORAL_CAPSULE | Freq: Three times a day (TID) | ORAL | Status: DC | PRN
  Administered 2020-10-09: 25 mg via ORAL
  Filled 2020-10-05 (×2): qty 1

## 2020-10-05 MED ORDER — RIVAROXABAN 20 MG PO TABS
20.0000 mg | ORAL_TABLET | Freq: Every day | ORAL | Status: DC
  Administered 2020-10-06 – 2020-10-09 (×4): 20 mg via ORAL
  Filled 2020-10-05 (×4): qty 1

## 2020-10-05 MED ORDER — SODIUM CHLORIDE 0.9 % IV SOLN
25.0000 mg | INTRAVENOUS | Status: DC | PRN
  Administered 2020-10-06 – 2020-10-09 (×15): 25 mg via INTRAVENOUS
  Filled 2020-10-05 (×5): qty 25
  Filled 2020-10-05 (×2): qty 0.5
  Filled 2020-10-05 (×9): qty 25

## 2020-10-05 MED ORDER — HYDROMORPHONE HCL 2 MG/ML IJ SOLN
2.0000 mg | INTRAMUSCULAR | Status: AC
Start: 2020-10-05 — End: 2020-10-05
  Administered 2020-10-05: 2 mg via SUBCUTANEOUS
  Filled 2020-10-05: qty 1

## 2020-10-05 MED ORDER — DEFERASIROX 360 MG PO TABS: 1080.0000 mg | ORAL_TABLET | Freq: Every day | ORAL | Status: DC

## 2020-10-05 NOTE — Telephone Encounter (Signed)
Med Refill  Hydromorphone & promethazine  [facility]  [address] In [place]

## 2020-10-05 NOTE — Plan of Care (Signed)

## 2020-10-05 NOTE — ED Provider Notes (Signed)
[facility] DEPT Provider Note   CSN: [id number] Arrival date & time: 10/05/20  1519     History Chief Complaint  Patient presents with  . Sickle Cell Pain Crisis    Molly Gutierrez is a 40 y.o. female.  Presented to ER with concern for pain crisis.  History of sickle cell disease.  Patient reports that over the past couple days she has been having severe pain in her back.  Location similar to prior pain crises.  States pain is not responding to her home pain regimen.  No fevers or chills.  No chest pain or difficulty in breathing.  HPI     Past Medical History:  Diagnosis Date  . Asthma   . Eczema   . History of pulmonary embolus (PE)   . Sickle cell anemia [facility])     Patient Active Problem List   Diagnosis Date Noted  . 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 Sickle cell anemia with pain ([place]) 04/24/2020  . Mild intermittent asthma 03/31/2020  . Sickle cell anemia with crisis ([place]) 01/07/2020  . Sickle cell crisis acute chest syndrome ([place]) 07/29/2019  . Drug-seeking behavior 07/07/2019  . Sinus tachycardia 07/07/2019  . Therapeutic opioid-induced constipation (OIC) 07/07/2019  . Breast mass in female 06/29/2019  . Atelectasis of right lung 06/29/2019  . Sickle cell crisis ([place]) 06/27/2019  . Sickle cell pain crisis ([place]) 04/19/2019  . Pneumonia 03/27/2019  . 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 Anterior chest wall pain 11/13/2018  . Epigastric abdominal pain 11/13/2018  . Hematuria 11/13/2018  . Hyperbilirubinemia 11/12/2018  . Long term current use of anticoagulant therapy 11/12/2018  . History of pulmonary embolism 09/26/2018  . Acute pulmonary embolism ([place]) 09/26/2018  . Opioid dependence ([place]) 09/13/2018  . Port-A-Cath in place 09/13/2018  . Narcotic abuse, continuous ([place]) 08/28/2018  . Anemia 07/03/2018  . Personal history of other venous thrombosis and embolism 07/03/2018  . History of transfusion 07/03/2018  .  Gastro-esophageal reflux disease without esophagitis 06/02/2018  . Asthma 05/19/2018  . Anxiety and depression 10/30/2017  . At risk for sepsis 06/13/2017  . Mitral regurgitation 02/01/2014  . Itching 12/13/2013  . Nausea & vomiting 12/13/2013  . 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 Frequent complaints of pain 11/01/2013  . Hypokalemia 09/19/2013  . S/P total hip arthroplasty 05/19/2013  . 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 Lower urinary tract infectious disease 03/02/2013  . Chest pain 11/14/2012  . Sickle-cell anemia ([place]) 10/30/2012  . Pain management 08/01/2012  . Reticulocytosis 07/31/2012  . 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 Leukocytosis 03/27/2012  . History of gestational diabetes 10/07/2011  . Hb-SS disease with crisis, unspecified ([place]) 06/25/2010    Past Surgical History:  Procedure Laterality Date  . CHOLECYSTECTOMY    . ERCP    . JOINT REPLACEMENT    . PORTA CATH INSERTION    . TUBAL LIGATION    . WISDOM TOOTH EXTRACTION       OB History   No obstetric history on file.     Family History  Problem Relation Age of Onset  . Renal Disease Mother   . Hypertension Mother   . High Cholesterol Mother     Social History   Tobacco Use  . Smoking status: Never Smoker  . Smokeless tobacco: Never Used  Vaping Use  . Vaping Use: Never used  Substance Use Topics  . Alcohol use: Never  . Drug use: Never    Home Medications Prior to Admission medications   Medication Sig Start Date End Date Taking? Authorizing Provider  albuterol (VENTOLIN HFA) 108 (90 Base) MCG/ACT inhaler Inhale 2 puffs into the lungs 2 (two) times daily as needed for wheezing or shortness of breath. 04/13/20 04/13/21 Yes King, Diona Foley, NP  budesonide-formoterol (SYMBICORT) 80-4.5 MCG/ACT inhaler Inhale 2 puffs into  the lungs 2 (two) times daily. 04/17/20  Yes Vevelyn Francois, NP  celecoxib (CELEBREX) 200 MG capsule Take 1 capsule (200 mg total) by mouth daily. 08/02/20 08/02/21 Yes Vevelyn Francois, NP  Cholecalciferol (VITAMIN D3) 25 MCG (1000 UT) CAPS Take 1 capsule (1,000 Units total) by mouth daily. 04/13/20 04/13/21 Yes Vevelyn Francois, NP  Deferasirox (JADENU) 360 MG TABS Take 3 tablets (1,080 mg total) by mouth daily before breakfast. 04/13/20 04/13/21 Yes Vevelyn Francois, NP  diphenhydrAMINE (BENADRYL) 25 mg capsule Take 25 mg by mouth 3 (three) times daily as needed for itching.   Yes [provider]  folic acid (FOLVITE) 1 MG tablet Take 1 tablet (1 mg total) by mouth daily. 04/13/20 04/13/21 Yes Vevelyn Francois, NP  gabapentin (NEURONTIN) 300 MG capsule Take 1 capsule (300 mg total) by mouth 3 (three) times daily. 04/13/20 04/13/21 Yes King, Diona Foley, NP  HYDROmorphone (DILAUDID) 4 MG tablet Take 1 tablet (4 mg total) by mouth every 6 (six) hours as needed for up to 15 days for moderate pain or severe pain. 09/23/20 10/08/20 Yes Vevelyn Francois, NP  mirtazapine (REMERON) 45 MG tablet Take 1 tablet (45 mg total) by mouth at bedtime. 04/13/20 04/13/21 Yes Vevelyn Francois, NP  omeprazole (PRILOSEC) 20 MG capsule Take 1 capsule (20 mg total) by mouth 2 (two) times daily before a meal. 08/02/20 08/02/21 Yes King, Diona Foley, NP  polyethylene glycol (MIRALAX / GLYCOLAX) 17 g packet Take 17 g by mouth daily as needed for mild constipation.   Yes [provider]  promethazine (PHENERGAN) 25 MG tablet Take 0.5-1 tablets (12.5-25 mg total) by mouth every 6 (six) hours as needed for nausea or vomiting. 08/02/20 08/02/21 Yes Vevelyn Francois, NP  vitamin B-12 (CYANOCOBALAMIN) 1000 MCG tablet Take 1 tablet (1,000 mcg total) by mouth daily. 04/13/20 04/13/21 Yes King, Diona Foley, NP  voxelotor (OXBRYTA) 500 MG TABS tablet Take 500 mg by mouth daily. 09/13/20 10/13/20 Yes [provider]  XARELTO 20 MG  TABS tablet Take 1 tablet (20 mg total) by mouth daily with supper. 04/13/20 04/13/21 Yes Vevelyn Francois, NP  ergocalciferol (VITAMIN D2) 1.25 MG (50000 UT) capsule Take 1 capsule (50,000 Units total) by mouth once a week. X 12 weeks. Patient not taking: Reported on 10/05/2020 08/14/20 11/12/20  Vevelyn Francois, NP  mometasone-formoterol [facility]) 100-5 MCG/ACT AERO Inhale 2 puffs into the lungs daily as needed for wheezing or shortness of breath. Patient not taking: Reported on 10/05/2020 04/13/20 04/13/21  Vevelyn Francois, NP    Allergies    Cefaclor, Hydroxyurea, and Ketamine  Review of Systems   Review of Systems  Constitutional: Negative for chills and fever.  HENT: Negative for ear pain and sore throat.   Eyes: Negative for pain and visual disturbance.  Respiratory: Negative for cough and shortness of breath.   Cardiovascular: Negative for chest pain and palpitations.  Gastrointestinal: Negative for abdominal pain and vomiting.  Genitourinary: Negative for dysuria and hematuria.  Musculoskeletal: Positive for  back pain. Negative for arthralgias.  Skin: Negative for color change and rash.  Neurological: Negative for seizures and syncope.  All other systems reviewed and are negative.   Physical Exam Updated Vital Signs BP 108/78   Pulse 88   Temp 99.2 F (37.3 C) (Oral)   Resp 16   Ht 5\' 3"  (1.6 m)   Wt 52.2 kg   LMP 09/06/2020 (Approximate)   SpO2 97%   BMI 20.39 kg/m   Physical Exam Vitals and nursing note reviewed.  Constitutional:      General: She is not in acute distress.    Appearance: She is well-developed.  HENT:     Head: Normocephalic and atraumatic.  Eyes:     Conjunctiva/sclera: Conjunctivae normal.  Cardiovascular:     Rate and Rhythm: Normal rate and regular rhythm.     Heart sounds: No murmur heard.   Pulmonary:     Effort: Pulmonary effort is normal. No respiratory distress.     Breath sounds: Normal breath sounds.  Abdominal:     Palpations:  Abdomen is soft.     Tenderness: There is no abdominal tenderness.  Musculoskeletal:     Cervical back: Neck supple.  Skin:    General: Skin is warm and dry.  Neurological:     General: No focal deficit present.     Mental Status: She is alert.  Psychiatric:        Mood and Affect: Mood normal.        Behavior: Behavior normal.     ED Results / Procedures / Treatments   Labs (all labs ordered are listed, but only abnormal results are displayed) Labs Reviewed  COMPREHENSIVE METABOLIC PANEL - Abnormal; Notable for the following components:      Result Value   Potassium 3.1 (*)    Calcium 7.4 (*)    Total Bilirubin 3.0 (*)    All other components within normal limits  CBC WITH DIFFERENTIAL/PLATELET - Abnormal; Notable for the following components:   WBC 14.5 (*)    RBC 2.63 (*)    Hemoglobin 7.4 (*)    HCT 22.9 (*)    RDW 21.0 (*)    Platelets 619 (*)    nRBC 3.0 (*)    Neutro Abs 9.3 (*)    Monocytes Absolute 2.3 (*)    Abs Immature Granulocytes 0.14 (*)    All other components within normal limits  RETICULOCYTES - Abnormal; Notable for the following components:   RBC. 2.61 (*)    All other components within normal limits  SARS CORONAVIRUS 2 (TAT 6-24 HRS)  I-STAT BETA HCG BLOOD, ED ([facility], [facility], [facility] ONLY)    EKG None  Radiology No results found.  Procedures Procedures   Medications Ordered in ED Medications  promethazine (PHENERGAN) 12.5 mg in sodium chloride 0.9 % 50 mL IVPB (0 mg Intravenous Stopped 10/05/20 1750)  diphenhydrAMINE (BENADRYL) injection 25 mg (25 mg Intravenous Given 10/05/20 1717)  HYDROmorphone (DILAUDID) injection 2 mg (2 mg Subcutaneous Given 10/05/20 1717)  HYDROmorphone (DILAUDID) injection 2 mg (2 mg Subcutaneous Given 10/05/20 1808)  diphenhydrAMINE (BENADRYL) injection 25 mg (25 mg Intravenous Given 10/05/20 1930)  HYDROmorphone (DILAUDID) injection 2 mg (2 mg Subcutaneous Given 10/05/20 1932)    ED Course  I have reviewed the triage vital  signs and the nursing notes.  Pertinent labs & imaging results that were available during my care of the patient were reviewed by me and considered in my medical decision making (see chart  for details).    MDM Rules/Calculators/A&P                         40 year old sickle cell patient presents to ER with concern for pain crisis.  Still having significant pain despite multiple doses of Dilaudid.  Basic labs are grossly stable though slight drop in her hemoglobin.  Believe she would benefit from admission for further management.  Will consult hospitalist.  Final Clinical Impression(s) / ED Diagnoses Final diagnoses:  Sickle cell pain crisis [facility])    Rx / DC Orders ED Discharge Orders    None       Lucrezia Starch, MD 10/05/20 2012

## 2020-10-05 NOTE — ED Triage Notes (Signed)
Patient c/o sickle cell pain in the upper, mid, and lower back x 2 days.

## 2020-10-05 NOTE — ED Provider Notes (Signed)
MSE was initiated and I personally evaluated the patient and placed orders (if any) at  3:41 PM on October 05, 2020.  The patient appears stable so that the remainder of the MSE may be completed by another provider.  Patient placed in Quick Look pathway, seen and evaluated   Chief Complaint: back pain   HPI:  40 y.o. F who presents for evaluation of back pain x 2 days. H/o sickle cell and states this feels like a pain crisis but states that her pain is worse today. Associated with nausea. No CP, SOB. No abd pain, urinary complaints. No h/o trauma.   ROS: back pain (one)  Physical Exam:   Gen: No distress  Neuro: Awake and Alert  Skin: Warm    Focused Exam: Diffuse tenderness to palpation noted to the entire thoracic and lumbar region including the midline. No deformity or step offs noted.    Initiation of care has begun. The patient has been counseled on the process, plan, and necessity for staying for the completion/evaluation, and the remainder of the medical screening examination    Desma Mcgregor 10/05/20 1543    Lucrezia Starch, MD 10/05/20 [phone]

## 2020-10-05 NOTE — ED Notes (Signed)
Adonis Huguenin RN messaged. Vitals up to date.

## 2020-10-05 NOTE — H&P (Signed)
History and Physical   [facility] KPT:[id number] DOB: 1981/06/01 DOA: 10/05/2020  Referring MD/NP/PA: Dr. Roslynn Amble  PCP: Vevelyn Francois, NP   Outpatient Specialists: None  Patient coming from: Home  Chief Complaint: Pain in legs and back  HPI: Molly Gutierrez is a 40 y.o. female with medical history significant of sickle cell disease, history of PE, asthma and chronic pain syndrome who presents to the ER with 3 days of progressive pain in her legs and back.  Pain is consistent with her typical sickle cell crisis.  Patient has taken her home regimen but no relief.  Denied any fever or chills.  Denied any nausea vomiting or diarrhea.  Patient was seen in the ER and evaluated.  She is being admitted to the hospital after failing to get relieved with multiple doses of Dilaudid..  ED Course: Temperature 99.3, blood pressure 157/115, pulse 110 respiratory 27 oxygen sat 92% on room air.  White count is 14.5 hemoglobin 7.4 and platelets 619.  Chemistry largely within normal.  Respiratory panel currently pending.  Patient being admitted with acute sickle cell crisis.  Review of Systems: As per HPI otherwise 10 point review of systems negative.    Past Medical History:  Diagnosis Date  . Asthma   . Eczema   . History of pulmonary embolus (PE)   . Sickle cell anemia (HCC)     Past Surgical History:  Procedure Laterality Date  . CHOLECYSTECTOMY    . ERCP    . JOINT REPLACEMENT    . PORTA CATH INSERTION    . TUBAL LIGATION    . WISDOM TOOTH EXTRACTION       reports that she has never smoked. She has never used smokeless tobacco. She reports that she does not drink alcohol and does not use drugs.  Allergies  Allergen Reactions  . Cefaclor Hives and Swelling  . Hydroxyurea Palpitations and Other (See Comments)    Lowers "blood levels" and heart rate (causes HYPOtension); "it messes me up, it drops my levels and stuff"  Other reaction(s): Hypotension, Other (See  Comments) "it messes me up, it drops my levels and stuff" Lower blood levels and HR  Other reaction(s): Other (See Comments) Lowers "blood levels" and heart rate (causes HYPOtension); "it messes me up, it drops my levels and stuff"  . Ketamine Palpitations and Other (See Comments)    "Pt states she has had previous reaction to ketamine. States she becomes flushed, heart races, dizzy, and feels like she is going to pass out." Other reaction(s): Other (See Comments), Other (See Comments) "Pt states she has had previous reaction to ketamine. States she becomes flushed, heart races, dizzy, and feels like she is going to pass out." "Pt states she has had previous reaction to ketamine. States she becomes flushed, heart races, dizzy, and feels like she is going to pass out." Other reaction(s): Other (See Comments) tachycardia    Family History  Problem Relation Age of Onset  . Renal Disease Mother   . Hypertension Mother   . High Cholesterol Mother      Prior to Admission medications   Medication Sig Start Date End Date Taking? Authorizing Provider  albuterol (VENTOLIN HFA) 108 (90 Base) MCG/ACT inhaler Inhale 2 puffs into the lungs 2 (two) times daily as needed for wheezing or shortness of breath. 04/13/20 04/13/21 Yes King, Diona Foley, NP  budesonide-formoterol (SYMBICORT) 80-4.5 MCG/ACT inhaler Inhale 2 puffs into the lungs 2 (two) times daily. 04/17/20  Yes Vevelyn Francois,  NP  celecoxib (CELEBREX) 200 MG capsule Take 1 capsule (200 mg total) by mouth daily. 08/02/20 08/02/21 Yes Vevelyn Francois, NP  Cholecalciferol (VITAMIN D3) 25 MCG (1000 UT) CAPS Take 1 capsule (1,000 Units total) by mouth daily. 04/13/20 04/13/21 Yes Vevelyn Francois, NP  Deferasirox (JADENU) 360 MG TABS Take 3 tablets (1,080 mg total) by mouth daily before breakfast. 04/13/20 04/13/21 Yes Vevelyn Francois, NP  diphenhydrAMINE (BENADRYL) 25 mg capsule Take 25 mg by mouth 3 (three) times daily as needed for itching.   Yes  [provider]  folic acid (FOLVITE) 1 MG tablet Take 1 tablet (1 mg total) by mouth daily. 04/13/20 04/13/21 Yes Vevelyn Francois, NP  gabapentin (NEURONTIN) 300 MG capsule Take 1 capsule (300 mg total) by mouth 3 (three) times daily. 04/13/20 04/13/21 Yes King, Diona Foley, NP  HYDROmorphone (DILAUDID) 4 MG tablet Take 1 tablet (4 mg total) by mouth every 6 (six) hours as needed for up to 15 days for moderate pain or severe pain. 09/23/20 10/08/20 Yes Vevelyn Francois, NP  mirtazapine (REMERON) 45 MG tablet Take 1 tablet (45 mg total) by mouth at bedtime. 04/13/20 04/13/21 Yes Vevelyn Francois, NP  omeprazole (PRILOSEC) 20 MG capsule Take 1 capsule (20 mg total) by mouth 2 (two) times daily before a meal. 08/02/20 08/02/21 Yes King, Diona Foley, NP  polyethylene glycol (MIRALAX / GLYCOLAX) 17 g packet Take 17 g by mouth daily as needed for mild constipation.   Yes [provider]  promethazine (PHENERGAN) 25 MG tablet Take 0.5-1 tablets (12.5-25 mg total) by mouth every 6 (six) hours as needed for nausea or vomiting. 08/02/20 08/02/21 Yes Vevelyn Francois, NP  vitamin B-12 (CYANOCOBALAMIN) 1000 MCG tablet Take 1 tablet (1,000 mcg total) by mouth daily. 04/13/20 04/13/21 Yes King, Diona Foley, NP  voxelotor (OXBRYTA) 500 MG TABS tablet Take 500 mg by mouth daily. 09/13/20 10/13/20 Yes [provider]  XARELTO 20 MG TABS tablet Take 1 tablet (20 mg total) by mouth daily with supper. 04/13/20 04/13/21 Yes Vevelyn Francois, NP  ergocalciferol (VITAMIN D2) 1.25 MG (50000 UT) capsule Take 1 capsule (50,000 Units total) by mouth once a week. X 12 weeks. Patient not taking: Reported on 10/05/2020 08/14/20 11/12/20  Vevelyn Francois, NP  mometasone-formoterol [facility]) 100-5 MCG/ACT AERO Inhale 2 puffs into the lungs daily as needed for wheezing or shortness of breath. Patient not taking: Reported on 10/05/2020 04/13/20 04/13/21  Vevelyn Francois, NP    Physical Exam: Vitals:   10/05/20 2000 10/05/20  2015 10/05/20 2030 10/05/20 2100  BP: 108/78 109/72 110/69 115/77  Pulse: 88 79 93 85  Resp: 16 (!) 8 12 11   Temp:    99.2 F (37.3 C)  TempSrc:    Oral  SpO2: 97% 92% 94% 97%  Weight:      Height:          Constitutional: Acutely ill looking in mild distress due to pain Vitals:   10/05/20 2000 10/05/20 2015 10/05/20 2030 10/05/20 2100  BP: 108/78 109/72 110/69 115/77  Pulse: 88 79 93 85  Resp: 16 (!) 8 12 11   Temp:    99.2 F (37.3 C)  TempSrc:    Oral  SpO2: 97% 92% 94% 97%  Weight:      Height:       Eyes: PERRL, lids and conjunctivae normal ENMT: Mucous membranes are dry. Posterior pharynx clear of any exudate or lesions.Normal dentition.  Neck: normal,  supple, no masses, no thyromegaly Respiratory: clear to auscultation bilaterally, no wheezing, no crackles. Normal respiratory effort. No accessory muscle use.  Cardiovascular: Sinus tachycardia, no murmurs / rubs / gallops. No extremity edema. 2+ pedal pulses. No carotid bruits.  Abdomen: no tenderness, no masses palpated. No hepatosplenomegaly. Bowel sounds positive.  Musculoskeletal: no clubbing / cyanosis. No joint deformity upper and lower extremities. Good ROM, no contractures. Normal muscle tone.  Skin: no rashes, lesions, ulcers. No induration Neurologic: CN 2-12 grossly intact. Sensation intact, DTR normal. Strength 5/5 in all 4.  Psychiatric: Normal judgment and insight. Alert and oriented x 3. Normal mood.     Labs on Admission: I have personally reviewed following labs and imaging studies  CBC: Recent Labs  Lab 10/05/20 1701  WBC 14.5*  NEUTROABS 9.3*  HGB 7.4*  HCT 22.9*  MCV 87.1  PLT 893*   Basic Metabolic Panel: Recent Labs  Lab 10/05/20 1701  NA 138  K 3.1*  CL 110  CO2 22  GLUCOSE 78  BUN 6  CREATININE 0.46  CALCIUM 7.4*   GFR: Estimated Creatinine Clearance: 77.8 mL/min (by C-G formula based on SCr of 0.46 mg/dL). Liver Function Tests: Recent Labs  Lab 10/05/20 1701  AST 27   ALT 16  ALKPHOS 51  BILITOT 3.0*  PROT 6.7  ALBUMIN 3.7   No results for input(s): LIPASE, AMYLASE in the last 168 hours. No results for input(s): AMMONIA in the last 168 hours. Coagulation Profile: No results for input(s): INR, PROTIME in the last 168 hours. Cardiac Enzymes: No results for input(s): CKTOTAL, CKMB, CKMBINDEX, TROPONINI in the last 168 hours. BNP (last 3 results) No results for input(s): PROBNP in the last 8760 hours. HbA1C: No results for input(s): HGBA1C in the last 72 hours. CBG: No results for input(s): GLUCAP in the last 168 hours. Lipid Profile: No results for input(s): CHOL, HDL, LDLCALC, TRIG, CHOLHDL, LDLDIRECT in the last 72 hours. Thyroid Function Tests: No results for input(s): TSH, T4TOTAL, FREET4, T3FREE, THYROIDAB in the last 72 hours. Anemia Panel: Recent Labs    10/05/20 1701  RETICCTPCT RESULTS UNAVAILABLE DUE TO INTERFERING SUBSTANCE   Urine analysis:    Component Value Date/Time   COLORURINE YELLOW 08/14/2019 1800   APPEARANCEUR CLEAR 08/14/2019 1800   LABSPEC 1.011 08/14/2019 1800   PHURINE 7.0 08/14/2019 1800   GLUCOSEU NEGATIVE 08/14/2019 1800   HGBUR SMALL (A) 08/14/2019 1800   BILIRUBINUR neg 08/03/2020 1429   KETONESUR negative 05/24/2020 1429   KETONESUR NEGATIVE 08/14/2019 1800   PROTEINUR Positive (A) 08/03/2020 1429   PROTEINUR 30 (A) 08/14/2019 1800   UROBILINOGEN 1.0 08/03/2020 1429   NITRITE positive 08/03/2020 1429   NITRITE NEGATIVE 08/14/2019 1800   LEUKOCYTESUR Negative 08/03/2020 1429   LEUKOCYTESUR NEGATIVE 08/14/2019 1800   Sepsis Labs: @LABRCNTIP (procalcitonin:4,lacticidven:4) )No results found for this or any previous visit (from the past 240 hour(s)).   Radiological Exams on Admission: No results found.  EKG: Independently reviewed.  Sinus tachycardia  Assessment/Plan Principal Problem:   Sickle cell disease with crisis ([place]) Active Problems:   Hypokalemia   Acute pulmonary embolism (HCC)    Anxiety and depression     #1 sickle cell painful crisis: Patient will be admitted.  Initiate Dilaudid PCA.  Continue with her Celebrex.  Hold home Dilaudid p.o.  IV fluid with D5 half-normal.  Continue pain assessment and monitoring.  #2 anemia of chronic disease: Monitor H&H.  #3 hypokalemia: Potassium of 3.0.  Replete potassium  #4 history of  pulmonary embolism: Patient on Xarelto.  Continue  #5 anxiety with depression: Continue home regimen.   DVT prophylaxis: Xarelto Code Status: Full Family Communication: No family at bedside Disposition Plan: Home Consults called: None Admission status: Inpatient  Severity of Illness: The appropriate patient status for this patient is INPATIENT. Inpatient status is judged to be reasonable and necessary in order to provide the required intensity of service to ensure the patient's safety. The patient's presenting symptoms, physical exam findings, and initial radiographic and laboratory data in the context of their chronic comorbidities is felt to place them at high risk for further clinical deterioration. Furthermore, it is not anticipated that the patient will be medically stable for discharge from the hospital within 2 midnights of admission. The following factors support the patient status of inpatient.   " The patient's presenting symptoms include pain all over. " The worrisome physical exam findings include tenderness over the bones. " The initial radiographic and laboratory data are worrisome because of hemoglobin 7.4. " The chronic co-morbidities include sickle cell disease.   * I certify that at the point of admission it is my clinical judgment that the patient will require inpatient hospital care spanning beyond 2 midnights from the point of admission due to high intensity of service, high risk for further deterioration and high frequency of surveillance required.Barbette Merino MD [facility] Pager [phone]  If  7PM-7AM, please contact night-coverage [email or web address] Password [facility]  10/05/2020, 10:08 PM

## 2020-10-05 NOTE — ED Notes (Signed)
RN on the floor was sent secure chat. Needs 15 more minutes before the patient can go upstairs.

## 2020-10-05 NOTE — ED Notes (Signed)
Patient requesting lab draw from port. 

## 2020-10-05 NOTE — ED Notes (Signed)
Patient ambulatory to restroom  ?

## 2020-10-06 ENCOUNTER — Other Ambulatory Visit: Payer: Self-pay | Admitting: Nurse Practitioner

## 2020-10-06 DIAGNOSIS — D5700 Hb-SS disease with crisis, unspecified: Secondary | ICD-10-CM | POA: Diagnosis not present

## 2020-10-06 LAB — CBC WITH DIFFERENTIAL/PLATELET
Abs Immature Granulocytes: 0.13 10*3/uL — ABNORMAL HIGH (ref 0.00–0.07)
Basophils Absolute: 0 10*3/uL (ref 0.0–0.1)
Basophils Relative: 0 %
Eosinophils Absolute: 0.3 10*3/uL (ref 0.0–0.5)
Eosinophils Relative: 2 %
HCT: 21.1 % — ABNORMAL LOW (ref 36.0–46.0)
Hemoglobin: 7 g/dL — ABNORMAL LOW (ref 12.0–15.0)
Immature Granulocytes: 1 %
Lymphocytes Relative: 18 %
Lymphs Abs: 2.4 10*3/uL (ref 0.7–4.0)
MCH: 28.5 pg (ref 26.0–34.0)
MCHC: 33.2 g/dL (ref 30.0–36.0)
MCV: 85.8 fL (ref 80.0–100.0)
Monocytes Absolute: 2 10*3/uL — ABNORMAL HIGH (ref 0.1–1.0)
Monocytes Relative: 15 %
Neutro Abs: 8.8 10*3/uL — ABNORMAL HIGH (ref 1.7–7.7)
Neutrophils Relative %: 64 %
Platelets: 508 10*3/uL — ABNORMAL HIGH (ref 150–400)
RBC: 2.46 MIL/uL — ABNORMAL LOW (ref 3.87–5.11)
RDW: 20.5 % — ABNORMAL HIGH (ref 11.5–15.5)
WBC: 13.7 10*3/uL — ABNORMAL HIGH (ref 4.0–10.5)
nRBC: 2.8 % — ABNORMAL HIGH (ref 0.0–0.2)

## 2020-10-06 LAB — SARS CORONAVIRUS 2 (TAT 6-24 HRS): SARS Coronavirus 2: NEGATIVE

## 2020-10-06 LAB — HIV ANTIBODY (ROUTINE TESTING W REFLEX): HIV Screen 4th Generation wRfx: NONREACTIVE

## 2020-10-06 MED ORDER — CHLORHEXIDINE GLUCONATE CLOTH 2 % EX PADS
6.0000 | MEDICATED_PAD | Freq: Every day | CUTANEOUS | Status: DC
  Administered 2020-10-06 – 2020-10-10 (×4): 6 via TOPICAL

## 2020-10-06 MED ORDER — HYDROMORPHONE HCL 4 MG PO TABS: 4.0000 mg | ORAL_TABLET | Freq: Four times a day (QID) | ORAL | 0 refills | Status: DC | PRN

## 2020-10-06 NOTE — Progress Notes (Incomplete)
Received a call from Telemetry that Pt. had PVCs,Dr Blount Notified at 2300

## 2020-10-06 NOTE — Telephone Encounter (Signed)
Sent  Refill for Promethazine is already at the pharmacy for 1 yr  Thanks

## 2020-10-06 NOTE — Progress Notes (Signed)
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  Patient's pain is down to 8 out of 10 today.  She is on Dilaudid PCA Toradol and IV fluids.  Patient also has some nausea.  She denied any fever or chills.  Denied any other significant complaints.  Objective: Vital signs in last 24 hours: Temp:  [98.8 F (37.1 C)-99.3 F (37.4 C)] 98.9 F (37.2 C) (04/22 0601) Pulse Rate:  [79-110] 92 (04/22 0601) Resp:  [8-27] 18 (04/22 0730) BP: (104-157)/(67-115) 112/75 (04/22 0601) SpO2:  [92 %-100 %] 97 % (04/22 0730) FiO2 (%):  [97 %] 97 % (04/22 0808) Weight:  [52.2 kg-55.2 kg] 55.2 kg (04/21 2211) Weight change:     Intake/Output from previous day: 04/21 0701 - 04/22 0700 In: 350 [P.O.:120; I.V.:30; IV Piggyback:200] Out: 700 [Urine:700] Intake/Output this shift: No intake/output data recorded.  General appearance: alert, cooperative, appears stated age and no distress Neck: no adenopathy, no carotid bruit, no JVD, supple, symmetrical, trachea midline and thyroid not enlarged, symmetric, no tenderness/mass/nodules Back: symmetric, no curvature. ROM normal. No CVA tenderness. Resp: clear to auscultation bilaterally Cardio: regular rate and rhythm, S1, S2 normal, no murmur, click, rub or gallop GI: soft, non-tender; bowel sounds normal; no masses,  no organomegaly Extremities: extremities normal, atraumatic, no cyanosis or edema Pulses: 2+ and symmetric Skin: Skin color, texture, turgor normal. No rashes or lesions Neurologic: Grossly normal  Lab Results: Recent Labs    10/05/20 1701 10/06/20 0500  WBC 14.5* 13.7*  HGB 7.4* 7.0*  HCT 22.9* 21.1*  PLT 619* 508*   BMET Recent Labs    10/05/20 1701  NA 138  K 3.1*  CL 110  CO2 22  GLUCOSE 78  BUN 6  CREATININE 0.46  CALCIUM 7.4*    Studies/Results: No results found.  Medications: I have reviewed the patient's current medications.  Assessment/Plan: 40 year old admitted with sickle cell pain  crisis.  #1 sickle cell painful crisis: Patient will be maintained on current regiment of Dilaudid PCA, Toradol, and IV fluids.  She seems to be responding to initial treatment.  Continue to mobilize.  Follow H&H.  #2 anemia of chronic disease: Hemoglobin is at 7.0.  Still close to baseline.  No transfusion required.  #3 leukocytosis: Secondary to vaso-occlusive crisis.  Continue to monitor white count.  Down to 13.7.  #4 hypokalemia: Potassium repleted.  #5 thrombocytosis: Secondary to sickle cell disease.  Monitor platelets  LOS: 1 day   Yunis Voorheis,LAWAL 10/06/2020, 9:47 AM

## 2020-10-07 DIAGNOSIS — D5700 Hb-SS disease with crisis, unspecified: Secondary | ICD-10-CM | POA: Diagnosis not present

## 2020-10-07 MED ORDER — SODIUM CHLORIDE 0.9% FLUSH
10.0000 mL | INTRAVENOUS | Status: DC | PRN
  Administered 2020-10-10: 10 mL

## 2020-10-07 NOTE — Progress Notes (Signed)
Subjective: Patient's pain has decreased to 6 out of 10 today.  She was able to move around.  Denied any other significant complaint.  Pain is localized to her legs.  She was able to clean herself.  She was able to walk in the room today.  Otherwise no other significant complaints.  Objective: Vital signs in last 24 hours: Temp:  [97.4 F (36.3 C)-99.1 F (37.3 C)] 97.4 F (36.3 C) (04/23 2012) Pulse Rate:  [82-104] 82 (04/23 2012) Resp:  [12-18] 16 (04/23 2012) BP: (116-127)/(79-91) 127/89 (04/23 2012) SpO2:  [94 %-97 %] 94 % (04/23 2012) Weight:  [58.6 kg] 58.6 kg (04/23 0513) Weight change: 6.391 kg Last BM Date: 10/05/20  Intake/Output from previous day: 04/22 0701 - 04/23 0700 In: 240 [P.O.:240] Out: 1200 [Urine:1200] Intake/Output this shift: No intake/output data recorded.  General appearance: alert, cooperative, appears stated age and no distress Neck: no adenopathy, no carotid bruit, no JVD, supple, symmetrical, trachea midline and thyroid not enlarged, symmetric, no tenderness/mass/nodules Back: symmetric, no curvature. ROM normal. No CVA tenderness. Resp: clear to auscultation bilaterally Cardio: regular rate and rhythm, S1, S2 normal, no murmur, click, rub or gallop GI: soft, non-tender; bowel sounds normal; no masses,  no organomegaly Extremities: extremities normal, atraumatic, no cyanosis or edema Pulses: 2+ and symmetric Skin: Skin color, texture, turgor normal. No rashes or lesions Neurologic: Grossly normal  Lab Results: Recent Labs    10/05/20 1701 10/06/20 0500  WBC 14.5* 13.7*  HGB 7.4* 7.0*  HCT 22.9* 21.1*  PLT 619* 508*   BMET Recent Labs    10/05/20 1701  NA 138  K 3.1*  CL 110  CO2 22  GLUCOSE 78  BUN 6  CREATININE 0.46  CALCIUM 7.4*    Studies/Results: No results found.  Medications: I have reviewed the patient's current medications.  Assessment/Plan: 40 year old admitted with sickle cell pain crisis.  #1 sickle cell  painful crisis: Patient is showing some improvement at this point.  We will therefore encourage patient to walk up and down the hallway.  Patient will be maintained on current regiment of Dilaudid PCA, Toradol, and IV fluids.  She seems to be responding to initial treatment.  Continue to mobilize.  Follow H&H.  #2 anemia of chronic disease: Hemoglobin is at 7.0 yesterday.  Recheck in the morning.  Still close to baseline.  No transfusion required.  #3 leukocytosis: Secondary to vaso-occlusive crisis.  Continue to monitor white count.  Down to 13.7.  We will recheck in the morning  #4 hypokalemia: Potassium repleted.  #5 thrombocytosis: Secondary to sickle cell disease.  Monitor platelets  LOS: 2 days   Shamara Soza,LAWAL 10/07/2020, 11:05 PM

## 2020-10-08 DIAGNOSIS — D5700 Hb-SS disease with crisis, unspecified: Secondary | ICD-10-CM | POA: Diagnosis not present

## 2020-10-08 LAB — COMPREHENSIVE METABOLIC PANEL
ALT: 12 U/L (ref 0–44)
AST: 32 U/L (ref 15–41)
Albumin: 3.9 g/dL (ref 3.5–5.0)
Alkaline Phosphatase: 57 U/L (ref 38–126)
Anion gap: 7 (ref 5–15)
BUN: 6 mg/dL (ref 6–20)
CO2: 26 mmol/L (ref 22–32)
Calcium: 8.6 mg/dL — ABNORMAL LOW (ref 8.9–10.3)
Chloride: 107 mmol/L (ref 98–111)
Creatinine, Ser: 0.53 mg/dL (ref 0.44–1.00)
GFR, Estimated: >60 mL/min (ref >60)
Glucose, Bld: 122 mg/dL — ABNORMAL HIGH (ref 70–99)
Potassium: 3.7 mmol/L (ref 3.5–5.1)
Sodium: 140 mmol/L (ref 135–145)
Total Bilirubin: 1.8 mg/dL — ABNORMAL HIGH (ref 0.3–1.2)
Total Protein: 6.9 g/dL (ref 6.5–8.1)

## 2020-10-08 LAB — CBC WITH DIFFERENTIAL/PLATELET
Abs Immature Granulocytes: 0.17 10*3/uL — ABNORMAL HIGH (ref 0.00–0.07)
Basophils Absolute: 0.1 10*3/uL (ref 0.0–0.1)
Basophils Relative: 1 %
Eosinophils Absolute: 0.6 10*3/uL — ABNORMAL HIGH (ref 0.0–0.5)
Eosinophils Relative: 4 %
HCT: 22.2 % — ABNORMAL LOW (ref 36.0–46.0)
Hemoglobin: 7.4 g/dL — ABNORMAL LOW (ref 12.0–15.0)
Immature Granulocytes: 1 %
Lymphocytes Relative: 21 %
Lymphs Abs: 3.1 10*3/uL (ref 0.7–4.0)
MCH: 29.2 pg (ref 26.0–34.0)
MCHC: 33.3 g/dL (ref 30.0–36.0)
MCV: 87.7 fL (ref 80.0–100.0)
Monocytes Absolute: 1.8 10*3/uL — ABNORMAL HIGH (ref 0.1–1.0)
Monocytes Relative: 12 %
Neutro Abs: 9 10*3/uL — ABNORMAL HIGH (ref 1.7–7.7)
Neutrophils Relative %: 61 %
Platelets: 411 10*3/uL — ABNORMAL HIGH (ref 150–400)
RBC: 2.53 MIL/uL — ABNORMAL LOW (ref 3.87–5.11)
RDW: 21.4 % — ABNORMAL HIGH (ref 11.5–15.5)
WBC: 14.7 10*3/uL — ABNORMAL HIGH (ref 4.0–10.5)
nRBC: 2.5 % — ABNORMAL HIGH (ref 0.0–0.2)

## 2020-10-08 MED ORDER — HEPARIN SOD (PORK) LOCK FLUSH 100 UNIT/ML IV SOLN
500.0000 [IU] | INTRAVENOUS | Status: AC | PRN
  Administered 2020-10-10: 500 [IU]
  Filled 2020-10-08: qty 5

## 2020-10-08 NOTE — Progress Notes (Signed)
Subjective: Patient's pain is still at 6 out of 10 today.  She was able to move around.  Denied any other significant complaint.  Pain is localized to her legs.  She was able to clean herself.  She was able to walk out of the room today.  Otherwise no other significant complaints.  No nausea and vomiting  Objective: Vital signs in last 24 hours: Temp:  [97.4 F (36.3 C)-99 F (37.2 C)] 99 F (37.2 C) (04/24 1324) Pulse Rate:  [82-94] 90 (04/24 1324) Resp:  [12-18] 18 (04/24 1324) BP: (119-130)/(71-94) 119/89 (04/24 1324) SpO2:  [92 %-96 %] 92 % (04/24 1324) FiO2 (%):  [93 %] 93 % (04/24 0803) Weight:  [57.8 kg] 57.8 kg (04/24 0355) Weight change: -0.812 kg Last BM Date: 10/05/20  Intake/Output from previous day: 04/23 0701 - 04/24 0700 In: 240 [P.O.:240] Out: 1200 [Urine:1200] Intake/Output this shift: Total I/O In: 404 [P.O.:354; IV Piggyback:50] Out: 400 [Urine:400]  General appearance: alert, cooperative, appears stated age and no distress Neck: no adenopathy, no carotid bruit, no JVD, supple, symmetrical, trachea midline and thyroid not enlarged, symmetric, no tenderness/mass/nodules Back: symmetric, no curvature. ROM normal. No CVA tenderness. Resp: clear to auscultation bilaterally Cardio: regular rate and rhythm, S1, S2 normal, no murmur, click, rub or gallop GI: soft, non-tender; bowel sounds normal; no masses,  no organomegaly Extremities: extremities normal, atraumatic, no cyanosis or edema Pulses: 2+ and symmetric Skin: Skin color, texture, turgor normal. No rashes or lesions Neurologic: Grossly normal  Lab Results: Recent Labs    10/06/20 0500 10/08/20 0521  WBC 13.7* 14.7*  HGB 7.0* 7.4*  HCT 21.1* 22.2*  PLT 508* 411*   BMET Recent Labs    10/05/20 1701 10/08/20 0521  NA 138 140  K 3.1* 3.7  CL 110 107  CO2 22 26  GLUCOSE 78 122*  BUN 6 6  CREATININE 0.46 0.53  CALCIUM 7.4* 8.6*    Studies/Results: No results found.  Medications: I have  reviewed the patient's current medications.  Assessment/Plan: 40 year old admitted with sickle cell pain crisis.  #1 sickle cell painful crisis: Patient is showing some improvement at this point.  40 year old-year-old woman. Patient will be maintained on current regiment of Dilaudid PCA, Toradol, and IV fluids.  She seems to be responding to initial treatment.  Continue to mobilize.  Follow H&H.  #2 anemia of chronic disease: Hemoglobin is at 7.0 yesterday.  Recheck in the morning.  Still close to baseline.  No transfusion required.  #3 leukocytosis: Secondary to vaso-occlusive crisis.  Continue to monitor white count.  Down to 13.7.  We will recheck in the morning  #4 hypokalemia: Potassium repleted.  #5 thrombocytosis: Secondary to sickle cell disease.  Monitor platelets   LOS: 3 days   Zayyan Mullen,LAWAL 10/08/2020, 2:19 PM

## 2020-10-09 DIAGNOSIS — D5700 Hb-SS disease with crisis, unspecified: Secondary | ICD-10-CM | POA: Diagnosis not present

## 2020-10-09 LAB — CBC
HCT: 23.2 % — ABNORMAL LOW (ref 36.0–46.0)
Hemoglobin: 7.7 g/dL — ABNORMAL LOW (ref 12.0–15.0)
MCH: 29.7 pg (ref 26.0–34.0)
MCHC: 33.2 g/dL (ref 30.0–36.0)
MCV: 89.6 fL (ref 80.0–100.0)
Platelets: 369 10*3/uL (ref 150–400)
RBC: 2.59 MIL/uL — ABNORMAL LOW (ref 3.87–5.11)
RDW: 23.4 % — ABNORMAL HIGH (ref 11.5–15.5)
WBC: 16.1 10*3/uL — ABNORMAL HIGH (ref 4.0–10.5)
nRBC: 1.9 % — ABNORMAL HIGH (ref 0.0–0.2)

## 2020-10-09 MED ORDER — PROMETHAZINE HCL 25 MG PO TABS: 12.5000 mg | ORAL_TABLET | Freq: Four times a day (QID) | ORAL | Status: DC | PRN

## 2020-10-09 MED ORDER — HYDROMORPHONE HCL 2 MG/ML IJ SOLN
2.0000 mg | Freq: Four times a day (QID) | INTRAMUSCULAR | Status: DC | PRN
  Administered 2020-10-09 – 2020-10-10 (×3): 2 mg via INTRAVENOUS
  Filled 2020-10-09 (×3): qty 1

## 2020-10-09 MED ORDER — HYDROMORPHONE HCL 4 MG PO TABS
4.0000 mg | ORAL_TABLET | ORAL | Status: DC | PRN
  Administered 2020-10-09 – 2020-10-10 (×4): 4 mg via ORAL
  Filled 2020-10-09 (×4): qty 1

## 2020-10-09 MED FILL — Diphenhydramine HCl Inj 50 MG/ML: INTRAMUSCULAR | Qty: 0.5 | Status: AC

## 2020-10-09 MED FILL — Sodium Chloride IV Soln 0.9%: INTRAVENOUS | Qty: 50 | Status: AC

## 2020-10-09 NOTE — Care Management Important Message (Signed)
Important Message  Patient Details IM Letter given to the Patient. Name: Molly Gutierrez MRN: [id number] Date of Birth: 11-28-1980   Medicare Important Message Given:  Yes     Kerin Salen 10/09/2020, 10:55 AM

## 2020-10-09 NOTE — Progress Notes (Signed)
Subjective: Molly Gutierrez is a 40 year old female with medical history significant for sickle cell disease, chronic pain syndrome, opiate dependence and tolerance, history of mild intermittent asthma, and history of PE on Xarelto was admitted for sickle cell pain crisis. Patient says that pain is primarily to low back and lower extremities.  She rates her pain as 7/10 characterized as intermittent and aching. Patient denies any headache, shortness of breath, dizziness, urinary symptoms, nausea, vomiting, or diarrhea.  Objective:  Vital signs in last 24 hours:  Vitals:   10/09/20 0807 10/09/20 0957 10/09/20 1218 10/09/20 1406  BP:  97/61  114/71  Pulse:  84  86  Resp: 14 16 18 14   Temp:  98.7 F (37.1 C)  99 F (37.2 C)  TempSrc:  Oral  Oral  SpO2: 93% 94% 93% 95%  Weight:      Height:        Intake/Output from previous day:   Intake/Output Summary (Last 24 hours) at 10/09/2020 1654 Last data filed at 10/09/2020 1014 Gross per 24 hour  Intake 310 ml  Output 900 ml  Net -590 ml    Physical Exam: General: Alert, awake, oriented x3, in no acute distress.  HEENT: [place]/AT PEERL, EOMI Neck: Trachea midline,  no masses, no thyromegal,y no JVD, no carotid bruit OROPHARYNX:  Moist, No exudate/ erythema/lesions.  Heart: Regular rate and rhythm, without murmurs, rubs, gallops, PMI non-displaced, no heaves or thrills on palpation.  Lungs: Clear to auscultation, no wheezing or rhonchi noted. No increased vocal fremitus resonant to percussion  Abdomen: Soft, nontender, nondistended, positive bowel sounds, no masses no hepatosplenomegaly noted..  Neuro: No focal neurological deficits noted cranial nerves II through XII grossly intact. DTRs 2+ bilaterally upper and lower extremities. Strength 5 out of 5 in bilateral upper and lower extremities. Musculoskeletal: No warm swelling or erythema around joints, no spinal tenderness noted. Psychiatric: Patient alert and oriented x3, good  insight and cognition, good recent to remote recall. Lymph node survey: No cervical axillary or inguinal lymphadenopathy noted.  Lab Results:  Basic Metabolic Panel:    Component Value Date/Time   NA 140 10/08/2020 0521   NA 137 08/02/2020 1538   K 3.7 10/08/2020 0521   CL 107 10/08/2020 0521   CO2 26 10/08/2020 0521   BUN 6 10/08/2020 0521   BUN 7 08/02/2020 1538   CREATININE 0.53 10/08/2020 0521   GLUCOSE 122 (H) 10/08/2020 0521   CALCIUM 8.6 (L) 10/08/2020 0521   CBC:    Component Value Date/Time   WBC 16.1 (H) 10/09/2020 0755   HGB 7.7 (L) 10/09/2020 0755   HGB 7.8 (L) 08/02/2020 1538   HCT 23.2 (L) 10/09/2020 0755   HCT 22.3 (L) 08/02/2020 1538   PLT 369 10/09/2020 0755   PLT 560 (H) 08/02/2020 1538   MCV 89.6 10/09/2020 0755   MCV 92 08/02/2020 1538   NEUTROABS 9.0 (H) 10/08/2020 0521   NEUTROABS 9.5 (H) 08/02/2020 1538   LYMPHSABS 3.1 10/08/2020 0521   LYMPHSABS 3.0 08/02/2020 1538   MONOABS 1.8 (H) 10/08/2020 0521   EOSABS 0.6 (H) 10/08/2020 0521   EOSABS 0.4 08/02/2020 1538   BASOSABS 0.1 10/08/2020 0521   BASOSABS 0.1 08/02/2020 1538    Recent Results (from the past 240 hour(s))  SARS CORONAVIRUS 2 (TAT 6-24 HRS) Nasopharyngeal Nasopharyngeal Swab     Status: None   Collection Time: 10/05/20  9:00 PM   Specimen: Nasopharyngeal Swab  Result Value Ref Range Status   SARS Coronavirus 2  NEGATIVE NEGATIVE Final    Comment: (NOTE) SARS-CoV-2 target nucleic acids are NOT DETECTED.  The SARS-CoV-2 RNA is generally detectable in upper and lower respiratory specimens during the acute phase of infection. Negative results do not preclude SARS-CoV-2 infection, do not rule out co-infections with other pathogens, and should not be used as the sole basis for treatment or other patient management decisions. Negative results must be combined with clinical observations, patient history, and epidemiological information. The expected result is Negative.  Fact Sheet  for Patients: SugarRoll.be  Fact Sheet for Healthcare Providers: https://www.woods-mathews.com/  This test is not yet approved or cleared by the [place] FDA and  has been authorized for detection and/or diagnosis of SARS-CoV-2 by FDA under an Emergency Use Authorization (EUA). This EUA will remain  in effect (meaning this test can be used) for the duration of the COVID-19 declaration under Se ction 564(b)(1) of the Act, 21 U.S.C. section 360bbb-3(b)(1), unless the authorization is terminated or revoked sooner.  Performed at [facility], [place] [address] [place], [place] [address]     Studies/Results: No results found.  Medications: Scheduled Meds: . celecoxib  200 mg Oral Daily  . Chlorhexidine Gluconate Cloth  6 each Topical Daily  . cholecalciferol  1,000 Units Oral Daily  . Deferasirox  1,080 mg Oral QAC breakfast  . folic acid  1 mg Oral Daily  . gabapentin  300 mg Oral TID  . mirtazapine  45 mg Oral QHS  . mometasone-formoterol  2 puff Inhalation BID  . pantoprazole  40 mg Oral Daily  . rivaroxaban  20 mg Oral Q supper  . senna-docusate  1 tablet Oral BID  . vitamin B-12  1,000 mcg Oral Daily  . voxelotor  500 mg Oral Daily   Continuous Infusions: PRN Meds:.albuterol, diphenhydrAMINE, heparin lock flush, HYDROmorphone (DILAUDID) injection, HYDROmorphone, polyethylene glycol, promethazine, sodium chloride flush  Consultants:  None  Procedures:  None  Antibiotics:  None  Assessment/Plan: Principal Problem:   Sickle cell disease with crisis ([place]) Active Problems:   Hypokalemia   Acute pulmonary embolism (H[facility])   Anxiety and depression   Sickle cell pain crisis: Patient's pain has improved some overnight.  We will discontinue IV Dilaudid PCA and transition to home medications.  Dilaudid 4 mg by mouth every 4 hours as needed Dilaudid 1 mg every 6 hours as needed for severe breakthrough pain Toradol 15  mg IV every 6 hours for total of 5 days Patient encouraged to ambulate.  Possible discharge on 10/10/2020  Anemia of chronic disease: Hemoglobin is improved from previous at 7.4 g/dL.  This is consistent with her baseline.  No transfusion warranted at this time.  Continue to follow closely.  CBC in AM.  Leukocytosis: Secondary to vaso-occlusive pain crisis.  Continue to monitor closely.  CBC in AM.  Hypokalemia: Resolved.  Potassium repleted.  Continue to watch closely.  Thrombocytosis: Secondary to sickle cell disease.  Monitor platelets.  History of mild intermittent asthma: Stable.  Continue home medications.  Code Status: Full Code Family Communication: N/A Disposition Plan: Not yet ready for discharge  [place], MSN, FNP-C Patient [place] [address] [place], [place] [address] [phone]    If 5PM-8AM, please contact night-coverage.  10/09/2020, 4:54 PM  LOS: 4 days

## 2020-10-10 DIAGNOSIS — D5700 Hb-SS disease with crisis, unspecified: Secondary | ICD-10-CM | POA: Diagnosis not present

## 2020-10-10 MED ORDER — HEPARIN SOD (PORK) LOCK FLUSH 100 UNIT/ML IV SOLN: 500.0000 [IU] | Freq: Once | INTRAVENOUS | Status: DC

## 2020-10-10 NOTE — Discharge Summary (Signed)
Physician Discharge Summary  [facility] SWN:[id number] DOB: September 11, 1980 DOA: 10/05/2020  PCP: Vevelyn Francois, NP  Admit date: 10/05/2020  Discharge date: 10/10/2020  Discharge Diagnoses:  Principal Problem:   Sickle cell disease with crisis [facility]) Active Problems:   Hypokalemia   Acute pulmonary embolism ([place])   Anxiety and depression   Discharge Condition: Stable  Disposition:   Follow-up Information    Vevelyn Francois, NP Follow up.   Specialty: Adult Health Nurse Practitioner Contact information: [address] [place] [address] [phone]              Pt is discharged home in good condition and is to follow up with Vevelyn Francois, NP this week to have labs evaluated. Molly Gutierrez is instructed to increase activity slowly and balance with rest for the next few days, and use prescribed medication to complete treatment of pain  Diet: Regular Wt Readings from Last 3 Encounters:  10/09/20 61 kg  09/21/20 57 kg  08/23/20 59 kg    History of present illness:  Molly Gutierrez is a 40 year old female with a medical history significant for sickle cell disease, chronic pain syndrome, opiate dependence and tolerance, history of PE on Xarelto, history of mild intermittent asthma, and history of anemia of chronic disease presented to the ER with 3 days of progressive pain to low back and lower extremities.  Pain is consistent with her typical sickle cell crisis.  Patient is taking home medication regimen without relief.  Denies any fever or chills.  Denies any nausea, vomiting, or diarrhea.  Patient was treated and evaluated in the ER.  She is being admitted to the hospital after failing to achieve relief with multiple doses of IV Dilaudid.  ER course: Temperature 99.3, blood pressure 157/115, pulse 110, respirations 27, oxygen saturation 92% on room air.  WBC count is 14.5, hemoglobin 7.4 g/dL, and platelets 619.  Chemistry largely within normal  range.  Respiratory panel negative.  Patient admitted for sickle cell pain crisis.  Hospital Course:  Sickle cell disease with pain crisis: Patient was admitted for sickle cell pain crisis and managed appropriately with IVF, IV Dilaudid via PCA and IV Toradol, as well as other adjunct therapies per sickle cell pain management protocols.  IV Dilaudid PCA was weaned appropriately.  Patient transition to home medications.  She will follow-up with her PCP for medication management.  Pain intensity 6/10.  Sickle cell anemia: Hemoglobin is stable and consistent with patient's baseline.  Hemoglobin 7.7 prior to discharge.  Patient advised to follow-up with PCP in 2 weeks to repeat CBC with differential  History of PE: Patient will continue Xarelto  Patient is alert, oriented, and ambulating without assistance.  Oxygen saturation is 94% on room air.  Patient is aware of all upcoming appointments.  Patient was therefore discharged home today in a hemodynamically stable condition.   Molly Gutierrez will follow-up with PCP within 1 week of this discharge. Molly Gutierrez was counseled extensively about nonpharmacologic means of pain management, patient verbalized understanding and was appreciative of  the care received during this admission.   We discussed the need for good hydration, monitoring of hydration status, avoidance of heat, cold, stress, and infection triggers. We discussed the need to be adherent with taking Hydrea and other home medications. Patient was reminded of the need to seek medical attention immediately if any symptom of bleeding, anemia, or infection occurs.  Discharge Exam: Vitals:   10/10/20 0439 10/10/20 1001  BP: 111/76 106/77  Pulse: 90 96  Resp: 16 16  Temp: 98.8 F (37.1 C) 99.1 F (37.3 C)  SpO2: 98% 96%   Vitals:   10/09/20 2042 10/10/20 0001 10/10/20 0439 10/10/20 1001  BP: 109/74 116/70 111/76 106/77  Pulse: 89 94 90 96  Resp: 16 16 16 16   Temp: 98.9 F (37.2 C) 98.9 F  (37.2 C) 98.8 F (37.1 C) 99.1 F (37.3 C)  TempSrc: Oral Oral Oral Oral  SpO2: 97% 97% 98% 96%  Weight:      Height:        General appearance : Awake, alert, not in any distress. Speech Clear. Not toxic looking HEENT: Atraumatic and Normocephalic, pupils equally reactive to light and accomodation Neck: Supple, no JVD. No cervical lymphadenopathy.  Chest: Good air entry bilaterally, no added sounds  CVS: S1 S2 regular, no murmurs.  Abdomen: Bowel sounds present, Non tender and not distended with no guarding, rigidity or rebound. Extremities: B/L Lower Ext shows no edema, both legs are warm to touch Neurology: Awake alert, and oriented X 3, CN II-XII intact, Non focal Skin: No Rash  Discharge Instructions  Discharge Instructions    Discharge patient   Complete by: As directed    Discharge disposition: 01-Home or Self Care   Discharge patient date: 10/10/2020     Allergies as of 10/10/2020      Reactions   Cefaclor Hives, Swelling   Hydroxyurea Palpitations, Other (See Comments)   Lowers "blood levels" and heart rate (causes HYPOtension); "it messes me up, it drops my levels and stuff" Other reaction(s): Hypotension, Other (See Comments) "it messes me up, it drops my levels and stuff" Lower blood levels and HR Other reaction(s): Other (See Comments) Lowers "blood levels" and heart rate (causes HYPOtension); "it messes me up, it drops my levels and stuff"   Ketamine Palpitations, Other (See Comments)   "Pt states she has had previous reaction to ketamine. States she becomes flushed, heart races, dizzy, and feels like she is going to pass out." Other reaction(s): Other (See Comments), Other (See Comments) "Pt states she has had previous reaction to ketamine. States she becomes flushed, heart races, dizzy, and feels like she is going to pass out." "Pt states she has had previous reaction to ketamine. States she becomes flushed, heart races, dizzy, and feels like she is going to  pass out." Other reaction(s): Other (See Comments) tachycardia      Medication List    TAKE these medications   albuterol 108 (90 Base) MCG/ACT inhaler Commonly known as: VENTOLIN HFA Inhale 2 puffs into the lungs 2 (two) times daily as needed for wheezing or shortness of breath.   budesonide-formoterol 80-4.5 MCG/ACT inhaler Commonly known as: SYMBICORT Inhale 2 puffs into the lungs 2 (two) times daily.   celecoxib 200 MG capsule Commonly known as: CELEBREX Take 1 capsule (200 mg total) by mouth daily.   Deferasirox 360 MG Tabs Commonly known as: Jadenu Take 3 tablets (1,080 mg total) by mouth daily before breakfast.   diphenhydrAMINE 25 mg capsule Commonly known as: BENADRYL Take 25 mg by mouth 3 (three) times daily as needed for itching.   ergocalciferol 1.25 MG (50000 UT) capsule Commonly known as: VITAMIN D2 Take 1 capsule (50,000 Units total) by mouth once a week. X 12 weeks.   folic acid 1 MG tablet Commonly known as: FOLVITE Take 1 tablet (1 mg total) by mouth daily.   gabapentin 300 MG capsule Commonly known as: NEURONTIN Take 1 capsule (300 mg  total) by mouth 3 (three) times daily.   HYDROmorphone 4 MG tablet Commonly known as: DILAUDID Take 1 tablet (4 mg total) by mouth every 6 (six) hours as needed for up to 15 days for moderate pain or severe pain. Start taking on: October 12, 2020 What changed: These instructions start on October 12, 2020. If you are unsure what to do until then, ask your doctor or other care provider.   mirtazapine 45 MG tablet Commonly known as: REMERON Take 1 tablet (45 mg total) by mouth at bedtime.   mometasone-formoterol 100-5 MCG/ACT Aero Commonly known as: DULERA Inhale 2 puffs into the lungs daily as needed for wheezing or shortness of breath.   omeprazole 20 MG capsule Commonly known as: PRILOSEC Take 1 capsule (20 mg total) by mouth 2 (two) times daily before a meal.   Oxbryta 500 MG Tabs tablet Generic drug:  voxelotor Take 500 mg by mouth daily.   polyethylene glycol 17 g packet Commonly known as: MIRALAX / GLYCOLAX Take 17 g by mouth daily as needed for mild constipation.   promethazine 25 MG tablet Commonly known as: PHENERGAN Take 0.5-1 tablets (12.5-25 mg total) by mouth every 6 (six) hours as needed for nausea or vomiting.   vitamin B-12 1000 MCG tablet Commonly known as: CYANOCOBALAMIN Take 1 tablet (1,000 mcg total) by mouth daily.   Vitamin D3 25 MCG (1000 UT) Caps Take 1 capsule (1,000 Units total) by mouth daily.   Xarelto 20 MG Tabs tablet Generic drug: rivaroxaban Take 1 tablet (20 mg total) by mouth daily with supper.       The results of significant diagnostics from this hospitalization (including imaging, microbiology, ancillary and laboratory) are listed below for reference.    Significant Diagnostic Studies: DG Chest 2 View  Result Date: 09/21/2020 CLINICAL DATA:  Chest pain.  Sickle cell crisis. EXAM: CHEST - 2 VIEW COMPARISON:  August 05, 2020 FINDINGS: Stable bilateral venous Port-A-Cath positioning is noted. There is no evidence of acute infiltrate, pleural effusion or pneumothorax. The heart size and mediastinal contours are within normal limits. Radiopaque surgical clips are seen within the right upper quadrant. The visualized skeletal structures are unremarkable. IMPRESSION: No active cardiopulmonary disease. Electronically Signed   By: Virgina Norfolk M.D.   On: 09/21/2020 20:04    Microbiology: Recent Results (from the past 240 hour(s))  SARS CORONAVIRUS 2 (TAT 6-24 HRS) Nasopharyngeal Nasopharyngeal Swab     Status: None   Collection Time: 10/05/20  9:00 PM   Specimen: Nasopharyngeal Swab  Result Value Ref Range Status   SARS Coronavirus 2 NEGATIVE NEGATIVE Final    Comment: (NOTE) SARS-CoV-2 target nucleic acids are NOT DETECTED.  The SARS-CoV-2 RNA is generally detectable in upper and lower respiratory specimens during the acute phase of  infection. Negative results do not preclude SARS-CoV-2 infection, do not rule out co-infections with other pathogens, and should not be used as the sole basis for treatment or other patient management decisions. Negative results must be combined with clinical observations, patient history, and epidemiological information. The expected result is Negative.  Fact Sheet for Patients: SugarRoll.be  Fact Sheet for Healthcare Providers: https://www.woods-mathews.com/  This test is not yet approved or cleared by the [place] FDA and  has been authorized for detection and/or diagnosis of SARS-CoV-2 by FDA under an Emergency Use Authorization (EUA). This EUA will remain  in effect (meaning this test can be used) for the duration of the COVID-19 declaration under Se ction 564(b)(1) of the  Act, 21 U.S.C. section 360bbb-3(b)(1), unless the authorization is terminated or revoked sooner.  Performed at [facility], [place] [address] [place], [place] [address]      Labs: Basic Metabolic Panel: Recent Labs  Lab 10/05/20 1701 10/08/20 0521  NA 138 140  K 3.1* 3.7  CL 110 107  CO2 22 26  GLUCOSE 78 122*  BUN 6 6  CREATININE 0.46 0.53  CALCIUM 7.4* 8.6*   Liver Function Tests: Recent Labs  Lab 10/05/20 1701 10/08/20 0521  AST 27 32  ALT 16 12  ALKPHOS 51 57  BILITOT 3.0* 1.8*  PROT 6.7 6.9  ALBUMIN 3.7 3.9   No results for input(s): LIPASE, AMYLASE in the last 168 hours. No results for input(s): AMMONIA in the last 168 hours. CBC: Recent Labs  Lab 10/05/20 1701 10/06/20 0500 10/08/20 0521 10/09/20 0755  WBC 14.5* 13.7* 14.7* 16.1*  NEUTROABS 9.3* 8.8* 9.0*  --   HGB 7.4* 7.0* 7.4* 7.7*  HCT 22.9* 21.1* 22.2* 23.2*  MCV 87.1 85.8 87.7 89.6  PLT 619* 508* 411* 369   Cardiac Enzymes: No results for input(s): CKTOTAL, CKMB, CKMBINDEX, TROPONINI in the last 168 hours. BNP: Invalid input(s): POCBNP CBG: No results for  input(s): GLUCAP in the last 168 hours.  Time coordinating discharge: 35 minutes  Signed:  [name]  APRN, MSN, FNP-C Patient [place], [place] [address] [phone]  [facility] 10/10/2020, 4:19 PM

## 2020-10-10 NOTE — Discharge Instructions (Signed)
Sickle Cell Anemia, Adult  Sickle cell anemia is a condition where your red blood cells are shaped like sickles. Red blood cells carry oxygen through the body. Sickle-shaped cells do not live as long as normal red blood cells. They also clump together and block blood from flowing through the blood vessels. This prevents the body from getting enough oxygen. Sickle cell anemia causes organ damage and pain. It also increases the risk of infection. Follow these instructions at home: Medicines  Take over-the-counter and prescription medicines only as told by your doctor.  If you were prescribed an antibiotic medicine, take it as told by your doctor. Do not stop taking the antibiotic even if you start to feel better.  If you develop a fever, do not take medicines to lower the fever right away. Tell your doctor about the fever. Managing pain, stiffness, and swelling  Try these methods to help with pain: ? Use a heating pad. ? Take a warm bath. ? Distract yourself, such as by watching TV. Eating and drinking  Drink enough fluid to keep your pee (urine) clear or pale yellow. Drink more in hot weather and during exercise.  Limit or avoid alcohol.  Eat a healthy diet. Eat plenty of fruits, vegetables, whole grains, and lean protein.  Take vitamins and supplements as told by your doctor. Traveling  When traveling, keep these with you: ? Your medical information. ? The names of your doctors. ? Your medicines.  If you need to take an airplane, talk to your doctor first. Activity  Rest often.  Avoid exercises that make your heart beat much faster, such as jogging. General instructions  Do not use products that have nicotine or tobacco, such as cigarettes and e-cigarettes. If you need help quitting, ask your doctor.  Consider wearing a medical alert bracelet.  Avoid being in high places (high altitudes), such as mountains.  Avoid very hot or cold temperatures.  Avoid places where the  temperature changes a lot.  Keep all follow-up visits as told by your doctor. This is important. Contact a doctor if:  A joint hurts.  Your feet or hands hurt or swell.  You feel tired (fatigued). Get help right away if:  You have symptoms of infection. These include: ? Fever. ? Chills. ? Being very tired. ? Irritability. ? Poor eating. ? Throwing up (vomiting).  You feel dizzy or faint.  You have new stomach pain, especially on the left side.  You have a an erection (priapism) that lasts more than 4 hours.  You have numbness in your arms or legs.  You have a hard time moving your arms or legs.  You have trouble talking.  You have pain that does not go away when you take medicine.  You are short of breath.  You are breathing fast.  You have a long-term cough.  You have pain in your chest.  You have a bad headache.  You have a stiff neck.  Your stomach looks bloated even though you did not eat much.  Your skin is pale.  You suddenly cannot see well. Summary  Sickle cell anemia is a condition where your red blood cells are shaped like sickles.  Follow your doctor's advice on ways to manage pain, food to eat, activities to do, and steps to take for safe travel.  Get medical help right away if you have any signs of infection, such as a fever. This information is not intended to replace advice given to you by   your health care provider. Make sure you discuss any questions you have with your health care provider. Document Revised: 10/28/2019 Document Reviewed: 10/28/2019 Elsevier Patient Education  2021 Elsevier Inc.  

## 2020-10-24 ENCOUNTER — Telehealth: Payer: Self-pay

## 2020-10-24 NOTE — Telephone Encounter (Signed)
Med refill  Hydromorphone Xarelto promethazine

## 2020-10-25 ENCOUNTER — Other Ambulatory Visit: Payer: Self-pay | Admitting: Nurse Practitioner

## 2020-10-25 MED ORDER — XARELTO 20 MG PO TABS: 20.0000 mg | ORAL_TABLET | Freq: Every day | ORAL | 3 refills | Status: DC

## 2020-10-25 MED ORDER — PROMETHAZINE HCL 25 MG PO TABS: 12.5000 mg | ORAL_TABLET | Freq: Four times a day (QID) | ORAL | 11 refills | Status: DC | PRN

## 2020-10-25 MED ORDER — HYDROMORPHONE HCL 4 MG PO TABS: 4.0000 mg | ORAL_TABLET | Freq: Four times a day (QID) | ORAL | 0 refills | Status: DC | PRN

## 2020-10-25 NOTE — Progress Notes (Signed)
   [place], [place]  [address] Phone:  [phone]   Fax:  [phone]  PDMP not reviewed this encounter.Subjective:    [facility] calls for refill of her Hydromorphone    Objective:   Indication for chronic opioid: Chronic pain related to sickle cell disease Medication and dose: Hydromorphone 4 mg # pills per month: 120 Last UDS date: 05/2020 Opioid Treatment Agreement signed (Y/N): Y Opioid Treatment Agreement last reviewed with patient:   2021 [facility] reviewed this encounter (include red flags):  N   Assessment:   Chronic Pain Syndrome related to Sickle Cell Disease    Plan:   Hydromorphone 4 mg #60 refill Return for follow-up appointment as scheduled.

## 2020-10-27 NOTE — Telephone Encounter (Signed)
sent 

## 2020-11-07 ENCOUNTER — Emergency Department (HOSPITAL_COMMUNITY): Payer: Medicare Other

## 2020-11-07 ENCOUNTER — Encounter (HOSPITAL_COMMUNITY): Payer: Self-pay

## 2020-11-07 ENCOUNTER — Other Ambulatory Visit: Payer: Self-pay

## 2020-11-07 ENCOUNTER — Inpatient Hospital Stay (HOSPITAL_COMMUNITY)
Admission: EM | Admit: 2020-11-07 | Discharge: 2020-11-13 | DRG: 812 | Disposition: A | Discharge status: Home / Self Care | Payer: Medicare Other | Attending: Internal Medicine | Admitting: Internal Medicine

## 2020-11-07 DIAGNOSIS — Z7901 Long term (current) use of anticoagulants: Secondary | ICD-10-CM | POA: Diagnosis not present

## 2020-11-07 DIAGNOSIS — J4520 Mild intermittent asthma, uncomplicated: Secondary | ICD-10-CM | POA: Diagnosis not present

## 2020-11-07 DIAGNOSIS — Z888 Allergy status to other drugs, medicaments and biological substances status: Secondary | ICD-10-CM

## 2020-11-07 DIAGNOSIS — Z86711 Personal history of pulmonary embolism: Secondary | ICD-10-CM | POA: Diagnosis not present

## 2020-11-07 DIAGNOSIS — D638 Anemia in other chronic diseases classified elsewhere: Secondary | ICD-10-CM | POA: Diagnosis present

## 2020-11-07 DIAGNOSIS — G894 Chronic pain syndrome: Secondary | ICD-10-CM | POA: Diagnosis not present

## 2020-11-07 DIAGNOSIS — K219 Gastro-esophageal reflux disease without esophagitis: Secondary | ICD-10-CM | POA: Diagnosis not present

## 2020-11-07 DIAGNOSIS — D72829 Elevated white blood cell count, unspecified: Secondary | ICD-10-CM | POA: Diagnosis not present

## 2020-11-07 DIAGNOSIS — D473 Essential (hemorrhagic) thrombocythemia: Secondary | ICD-10-CM | POA: Diagnosis not present

## 2020-11-07 DIAGNOSIS — Z79899 Other long term (current) drug therapy: Secondary | ICD-10-CM

## 2020-11-07 DIAGNOSIS — Z882 Allergy status to sulfonamides status: Secondary | ICD-10-CM | POA: Diagnosis not present

## 2020-11-07 DIAGNOSIS — Z7951 Long term (current) use of inhaled steroids: Secondary | ICD-10-CM

## 2020-11-07 DIAGNOSIS — F1120 Opioid dependence, uncomplicated: Secondary | ICD-10-CM | POA: Diagnosis not present

## 2020-11-07 DIAGNOSIS — Z9851 Tubal ligation status: Secondary | ICD-10-CM

## 2020-11-07 DIAGNOSIS — L309 Dermatitis, unspecified: Secondary | ICD-10-CM | POA: Diagnosis present

## 2020-11-07 DIAGNOSIS — F32A Depression, unspecified: Secondary | ICD-10-CM | POA: Diagnosis not present

## 2020-11-07 DIAGNOSIS — Z83438 Family history of other disorder of lipoprotein metabolism and other lipidemia: Secondary | ICD-10-CM

## 2020-11-07 DIAGNOSIS — Z20822 Contact with and (suspected) exposure to covid-19: Secondary | ICD-10-CM | POA: Diagnosis not present

## 2020-11-07 DIAGNOSIS — Z8249 Family history of ischemic heart disease and other diseases of the circulatory system: Secondary | ICD-10-CM

## 2020-11-07 DIAGNOSIS — D5700 Hb-SS disease with crisis, unspecified: Secondary | ICD-10-CM | POA: Diagnosis present

## 2020-11-07 DIAGNOSIS — F419 Anxiety disorder, unspecified: Secondary | ICD-10-CM | POA: Diagnosis not present

## 2020-11-07 DIAGNOSIS — Z841 Family history of disorders of kidney and ureter: Secondary | ICD-10-CM

## 2020-11-07 DIAGNOSIS — D649 Anemia, unspecified: Secondary | ICD-10-CM | POA: Diagnosis present

## 2020-11-07 DIAGNOSIS — I2699 Other pulmonary embolism without acute cor pulmonale: Secondary | ICD-10-CM | POA: Diagnosis present

## 2020-11-07 LAB — COMPREHENSIVE METABOLIC PANEL
ALT: 9 U/L (ref 0–44)
ALT: 10 U/L (ref 0–44)
AST: 33 U/L (ref 15–41)
AST: 30 U/L (ref 15–41)
Albumin: 4.3 g/dL (ref 3.5–5.0)
Albumin: 4.4 g/dL (ref 3.5–5.0)
Alkaline Phosphatase: 51 U/L (ref 38–126)
Alkaline Phosphatase: 49 U/L (ref 38–126)
Anion gap: 4 — ABNORMAL LOW (ref 5–15)
Anion gap: 9 (ref 5–15)
BUN: 13 mg/dL (ref 6–20)
BUN: 10 mg/dL (ref 6–20)
CO2: 24 mmol/L (ref 22–32)
CO2: 21 mmol/L — ABNORMAL LOW (ref 22–32)
Calcium: 9 mg/dL (ref 8.9–10.3)
Calcium: 8.9 mg/dL (ref 8.9–10.3)
Chloride: 109 mmol/L (ref 98–111)
Chloride: 107 mmol/L (ref 98–111)
Creatinine, Ser: 0.63 mg/dL (ref 0.44–1.00)
Creatinine, Ser: 0.57 mg/dL (ref 0.44–1.00)
GFR, Estimated: >60 mL/min (ref >60)
GFR, Estimated: >60 mL/min (ref >60)
Glucose, Bld: 123 mg/dL — ABNORMAL HIGH (ref 70–99)
Glucose, Bld: 96 mg/dL (ref 70–99)
Potassium: 3.7 mmol/L (ref 3.5–5.1)
Potassium: 3.9 mmol/L (ref 3.5–5.1)
Sodium: 137 mmol/L (ref 135–145)
Sodium: 137 mmol/L (ref 135–145)
Total Bilirubin: 2.7 mg/dL — ABNORMAL HIGH (ref 0.3–1.2)
Total Bilirubin: 2.6 mg/dL — ABNORMAL HIGH (ref 0.3–1.2)
Total Protein: 7.8 g/dL (ref 6.5–8.1)
Total Protein: 7.8 g/dL (ref 6.5–8.1)

## 2020-11-07 LAB — CBC WITH DIFFERENTIAL/PLATELET
Abs Immature Granulocytes: 0.07 10*3/uL (ref 0.00–0.07)
Abs Immature Granulocytes: 0.05 10*3/uL (ref 0.00–0.07)
Basophils Absolute: 0 10*3/uL (ref 0.0–0.1)
Basophils Absolute: 0 10*3/uL (ref 0.0–0.1)
Basophils Relative: 0 %
Basophils Relative: 0 %
Eosinophils Absolute: 0.1 10*3/uL (ref 0.0–0.5)
Eosinophils Absolute: 0.2 10*3/uL (ref 0.0–0.5)
Eosinophils Relative: 1 %
Eosinophils Relative: 1 %
HCT: 20 % — ABNORMAL LOW (ref 36.0–46.0)
HCT: 20.4 % — ABNORMAL LOW (ref 36.0–46.0)
Hemoglobin: 6.8 g/dL — CL (ref 12.0–15.0)
Hemoglobin: 7 g/dL — ABNORMAL LOW (ref 12.0–15.0)
Immature Granulocytes: 1 %
Immature Granulocytes: 0 %
Lymphocytes Relative: 18 %
Lymphocytes Relative: 26 %
Lymphs Abs: 2.3 10*3/uL (ref 0.7–4.0)
Lymphs Abs: 3.7 10*3/uL (ref 0.7–4.0)
MCH: 29.7 pg (ref 26.0–34.0)
MCH: 29.5 pg (ref 26.0–34.0)
MCHC: 34 g/dL (ref 30.0–36.0)
MCHC: 34.3 g/dL (ref 30.0–36.0)
MCV: 87.3 fL (ref 80.0–100.0)
MCV: 86.1 fL (ref 80.0–100.0)
Monocytes Absolute: 1.6 10*3/uL — ABNORMAL HIGH (ref 0.1–1.0)
Monocytes Absolute: 2.2 10*3/uL — ABNORMAL HIGH (ref 0.1–1.0)
Monocytes Relative: 13 %
Monocytes Relative: 16 %
Neutro Abs: 8.6 10*3/uL — ABNORMAL HIGH (ref 1.7–7.7)
Neutro Abs: 7.9 10*3/uL — ABNORMAL HIGH (ref 1.7–7.7)
Neutrophils Relative %: 67 %
Neutrophils Relative %: 57 %
Platelets: 508 10*3/uL — ABNORMAL HIGH (ref 150–400)
Platelets: 499 10*3/uL — ABNORMAL HIGH (ref 150–400)
RBC: 2.29 MIL/uL — ABNORMAL LOW (ref 3.87–5.11)
RBC: 2.37 MIL/uL — ABNORMAL LOW (ref 3.87–5.11)
RDW: 22.4 % — ABNORMAL HIGH (ref 11.5–15.5)
RDW: 22.6 % — ABNORMAL HIGH (ref 11.5–15.5)
WBC: 12.7 10*3/uL — ABNORMAL HIGH (ref 4.0–10.5)
WBC: 14 10*3/uL — ABNORMAL HIGH (ref 4.0–10.5)
nRBC: 0.3 % — ABNORMAL HIGH (ref 0.0–0.2)
nRBC: 0.2 % (ref 0.0–0.2)

## 2020-11-07 LAB — RETICULOCYTES
Immature Retic Fract: 18.7 % — ABNORMAL HIGH (ref 2.3–15.9)
RBC.: 2.25 MIL/uL — ABNORMAL LOW (ref 3.87–5.11)
Retic Count, Absolute: 184.1 10*3/uL (ref 19.0–186.0)
Retic Ct Pct: 8.2 % — ABNORMAL HIGH (ref 0.4–3.1)

## 2020-11-07 LAB — RESP PANEL BY RT-PCR (FLU A&B, COVID) ARPGX2
Influenza A by PCR: NEGATIVE
Influenza B by PCR: NEGATIVE
SARS Coronavirus 2 by RT PCR: NEGATIVE

## 2020-11-07 LAB — TROPONIN I (HIGH SENSITIVITY)
Troponin I (High Sensitivity): 2 ng/L (ref <18)
Troponin I (High Sensitivity): <2 ng/L (ref <18)

## 2020-11-07 LAB — I-STAT BETA HCG BLOOD, ED (MC, WL, AP ONLY): I-stat hCG, quantitative: <5 m[IU]/mL (ref <5)

## 2020-11-07 MED ORDER — RIVAROXABAN 10 MG PO TABS
20.0000 mg | ORAL_TABLET | Freq: Every day | ORAL | Status: DC
  Administered 2020-11-07 – 2020-11-12 (×6): 20 mg via ORAL
  Filled 2020-11-07 (×4): qty 2
  Filled 2020-11-07: qty 1
  Filled 2020-11-07 (×2): qty 2

## 2020-11-07 MED ORDER — DIPHENHYDRAMINE HCL 50 MG/ML IJ SOLN
25.0000 mg | Freq: Once | INTRAMUSCULAR | Status: AC
  Administered 2020-11-07: 25 mg via INTRAVENOUS
  Filled 2020-11-07: qty 1

## 2020-11-07 MED ORDER — MOMETASONE FURO-FORMOTEROL FUM 100-5 MCG/ACT IN AERO: 2.0000 | INHALATION_SPRAY | Freq: Every day | RESPIRATORY_TRACT | Status: DC | PRN

## 2020-11-07 MED ORDER — ALBUTEROL SULFATE (2.5 MG/3ML) 0.083% IN NEBU: 2.5000 mg | INHALATION_SOLUTION | Freq: Two times a day (BID) | RESPIRATORY_TRACT | Status: DC | PRN

## 2020-11-07 MED ORDER — SENNOSIDES-DOCUSATE SODIUM 8.6-50 MG PO TABS: 1.0000 | ORAL_TABLET | Freq: Every evening | ORAL | Status: DC | PRN

## 2020-11-07 MED ORDER — SODIUM CHLORIDE 0.9 % IV BOLUS
1000.0000 mL | Freq: Once | INTRAVENOUS | Status: AC
  Administered 2020-11-07: 1000 mL via INTRAVENOUS

## 2020-11-07 MED ORDER — GABAPENTIN 300 MG PO CAPS
300.0000 mg | ORAL_CAPSULE | Freq: Three times a day (TID) | ORAL | Status: DC | PRN
  Administered 2020-11-07: 300 mg via ORAL
  Filled 2020-11-07: qty 1

## 2020-11-07 MED ORDER — VITAMIN B-12 1000 MCG PO TABS
1000.0000 ug | ORAL_TABLET | Freq: Every day | ORAL | Status: DC
  Administered 2020-11-08 – 2020-11-13 (×6): 1000 ug via ORAL
  Filled 2020-11-07 (×6): qty 1

## 2020-11-07 MED ORDER — DEXTROSE-NACL 5-0.45 % IV SOLN: INTRAVENOUS | Status: DC

## 2020-11-07 MED ORDER — PANTOPRAZOLE SODIUM 40 MG PO TBEC
40.0000 mg | DELAYED_RELEASE_TABLET | Freq: Every day | ORAL | Status: DC
  Administered 2020-11-07 – 2020-11-13 (×7): 40 mg via ORAL
  Filled 2020-11-07 (×7): qty 1

## 2020-11-07 MED ORDER — SODIUM CHLORIDE 0.9% FLUSH: 9.0000 mL | INTRAVENOUS | Status: DC | PRN

## 2020-11-07 MED ORDER — PROMETHAZINE HCL 25 MG PO TABS
12.5000 mg | ORAL_TABLET | Freq: Four times a day (QID) | ORAL | Status: DC | PRN
Start: 2020-11-07 — End: 2020-11-13

## 2020-11-07 MED ORDER — MOMETASONE FURO-FORMOTEROL FUM 100-5 MCG/ACT IN AERO
2.0000 | INHALATION_SPRAY | Freq: Two times a day (BID) | RESPIRATORY_TRACT | Status: DC
  Administered 2020-11-07 – 2020-11-11 (×8): 2 via RESPIRATORY_TRACT
  Filled 2020-11-07 (×2): qty 8.8

## 2020-11-07 MED ORDER — DIPHENHYDRAMINE HCL 25 MG PO CAPS
25.0000 mg | ORAL_CAPSULE | ORAL | Status: DC | PRN
  Filled 2020-11-07 (×2): qty 1

## 2020-11-07 MED ORDER — POLYETHYLENE GLYCOL 3350 17 G PO PACK: 17.0000 g | PACK | Freq: Every day | ORAL | Status: DC | PRN

## 2020-11-07 MED ORDER — HYDROMORPHONE HCL 2 MG/ML IJ SOLN
2.0000 mg | Freq: Once | INTRAMUSCULAR | Status: AC
Start: 2020-11-07 — End: 2020-11-07
  Administered 2020-11-07: 2 mg via INTRAVENOUS
  Filled 2020-11-07: qty 1

## 2020-11-07 MED ORDER — SODIUM CHLORIDE 0.9 % IV SOLN
25.0000 mg | INTRAVENOUS | Status: DC | PRN
  Administered 2020-11-08 – 2020-11-13 (×18): 25 mg via INTRAVENOUS
  Filled 2020-11-07 (×17): qty 25
  Filled 2020-11-07: qty 0.5
  Filled 2020-11-07: qty 25
  Filled 2020-11-07 (×2): qty 0.5

## 2020-11-07 MED ORDER — ALBUTEROL SULFATE HFA 108 (90 BASE) MCG/ACT IN AERS: 2.0000 | INHALATION_SPRAY | Freq: Two times a day (BID) | RESPIRATORY_TRACT | Status: DC | PRN

## 2020-11-07 MED ORDER — SENNOSIDES-DOCUSATE SODIUM 8.6-50 MG PO TABS
1.0000 | ORAL_TABLET | Freq: Two times a day (BID) | ORAL | Status: DC
  Administered 2020-11-07 – 2020-11-13 (×12): 1 via ORAL
  Filled 2020-11-07 (×12): qty 1

## 2020-11-07 MED ORDER — VOXELOTOR 500 MG PO TABS: 1500.0000 mg | ORAL_TABLET | Freq: Every day | ORAL | Status: DC

## 2020-11-07 MED ORDER — DEFERASIROX 360 MG PO TABS
1080.0000 mg | ORAL_TABLET | Freq: Every day | ORAL | Status: DC
  Filled 2020-11-07: qty 3

## 2020-11-07 MED ORDER — FOLIC ACID 1 MG PO TABS
1.0000 mg | ORAL_TABLET | Freq: Every day | ORAL | Status: DC
  Administered 2020-11-07 – 2020-11-13 (×7): 1 mg via ORAL
  Filled 2020-11-07 (×7): qty 1

## 2020-11-07 MED ORDER — KETOROLAC TROMETHAMINE 30 MG/ML IJ SOLN
30.0000 mg | Freq: Four times a day (QID) | INTRAMUSCULAR | Status: AC
  Administered 2020-11-08 – 2020-11-12 (×20): 30 mg via INTRAVENOUS
  Filled 2020-11-07 (×20): qty 1

## 2020-11-07 MED ORDER — ONDANSETRON HCL 4 MG/2ML IJ SOLN: 4.0000 mg | Freq: Four times a day (QID) | INTRAMUSCULAR | Status: DC | PRN

## 2020-11-07 MED ORDER — NALOXONE HCL 0.4 MG/ML IJ SOLN: 0.4000 mg | INTRAMUSCULAR | Status: DC | PRN

## 2020-11-07 MED ORDER — HYDROMORPHONE 1 MG/ML IV SOLN
INTRAVENOUS | Status: DC
  Administered 2020-11-07: 30 mg via INTRAVENOUS
  Administered 2020-11-08: 4 mg via INTRAVENOUS
  Administered 2020-11-08: 3 mg via INTRAVENOUS
  Administered 2020-11-08: 1.5 mg via INTRAVENOUS
  Administered 2020-11-08: 4.5 mg via INTRAVENOUS
  Administered 2020-11-08: 3.5 mg via INTRAVENOUS
  Administered 2020-11-08: 5.5 mg via INTRAVENOUS
  Administered 2020-11-09: 4.5 mg via INTRAVENOUS
  Administered 2020-11-09: 4 mg via INTRAVENOUS
  Administered 2020-11-09: 7.5 mg via INTRAVENOUS
  Administered 2020-11-09: 4.5 mg via INTRAVENOUS
  Administered 2020-11-09: 2.5 mg via INTRAVENOUS
  Administered 2020-11-09: 4 mg via INTRAVENOUS
  Administered 2020-11-10: 30 mg via INTRAVENOUS
  Administered 2020-11-10 – 2020-11-11 (×2): 6.5 mg via INTRAVENOUS
  Administered 2020-11-11: 8 mL via INTRAVENOUS
  Administered 2020-11-11: 3.5 mg via INTRAVENOUS
  Administered 2020-11-11: 8 mg via INTRAVENOUS
  Administered 2020-11-11: 4 mg via INTRAVENOUS
  Administered 2020-11-11: 30 mg via INTRAVENOUS
  Administered 2020-11-11: 4.5 mL via INTRAVENOUS
  Administered 2020-11-11: 4.5 mg via INTRAVENOUS
  Administered 2020-11-12: 3.5 mg via INTRAVENOUS
  Administered 2020-11-12: 0.9 mg via INTRAVENOUS
  Administered 2020-11-12: 5 mg via INTRAVENOUS
  Administered 2020-11-12: 8 mg via INTRAVENOUS
  Administered 2020-11-12: 30 mg via INTRAVENOUS
  Administered 2020-11-12: 7 mg via INTRAVENOUS
  Administered 2020-11-12: 1 mg via INTRAVENOUS
  Administered 2020-11-13: 5.5 mg via INTRAVENOUS
  Administered 2020-11-13: 30 mg via INTRAVENOUS
  Administered 2020-11-13: 7 mg via INTRAVENOUS
  Administered 2020-11-13: 8 mg via INTRAVENOUS
  Filled 2020-11-07 (×6): qty 30

## 2020-11-07 MED ORDER — SODIUM CHLORIDE 0.9 % IV SOLN: 10.0000 mL/h | Freq: Once | INTRAVENOUS | Status: DC

## 2020-11-07 MED ORDER — HYDROMORPHONE HCL 2 MG/ML IJ SOLN
2.0000 mg | Freq: Once | INTRAMUSCULAR | Status: AC
  Administered 2020-11-07: 2 mg via INTRAVENOUS
  Filled 2020-11-07: qty 1

## 2020-11-07 MED ORDER — DIPHENHYDRAMINE HCL 25 MG PO CAPS
25.0000 mg | ORAL_CAPSULE | Freq: Three times a day (TID) | ORAL | Status: DC | PRN
  Administered 2020-11-07: 25 mg via ORAL
  Filled 2020-11-07: qty 1

## 2020-11-07 MED ORDER — HYDROMORPHONE HCL 1 MG/ML IJ SOLN
1.0000 mg | Freq: Once | INTRAMUSCULAR | Status: AC
Start: 2020-11-07 — End: 2020-11-07
  Administered 2020-11-07: 1 mg via INTRAVENOUS
  Filled 2020-11-07: qty 1

## 2020-11-07 MED ORDER — VITAMIN D 25 MCG (1000 UNIT) PO TABS
1000.0000 [IU] | ORAL_TABLET | Freq: Every day | ORAL | Status: DC
  Administered 2020-11-07 – 2020-11-13 (×7): 1000 [IU] via ORAL
  Filled 2020-11-07 (×6): qty 1

## 2020-11-07 MED ORDER — MIRTAZAPINE 15 MG PO TABS
45.0000 mg | ORAL_TABLET | Freq: Every day | ORAL | Status: DC
  Administered 2020-11-07 – 2020-11-12 (×6): 45 mg via ORAL
  Filled 2020-11-07 (×3): qty 3
  Filled 2020-11-07: qty 1
  Filled 2020-11-07 (×2): qty 3

## 2020-11-07 MED ORDER — HYDROMORPHONE HCL 2 MG/ML IJ SOLN
2.0000 mg | INTRAMUSCULAR | Status: DC | PRN
  Filled 2020-11-07: qty 1

## 2020-11-07 MED ORDER — SODIUM CHLORIDE 0.9 % IV SOLN
25.0000 mg | Freq: Four times a day (QID) | INTRAVENOUS | Status: DC | PRN
  Administered 2020-11-10 – 2020-11-13 (×9): 25 mg via INTRAVENOUS
  Filled 2020-11-07: qty 1
  Filled 2020-11-07: qty 25
  Filled 2020-11-07: qty 1
  Filled 2020-11-07 (×3): qty 25
  Filled 2020-11-07 (×2): qty 1
  Filled 2020-11-07 (×4): qty 25

## 2020-11-07 NOTE — H&P (Signed)
History and Physical   [facility] DJS:[id number] DOB: 04/13/1981 DOA: 11/07/2020  Referring MD/NP/PA: Dr. Roderic Palau  PCP: Vevelyn Francois, NP   Outpatient Specialists: None  Patient coming from: Home  Chief Complaint: Sickle cell pain  HPI: Molly Gutierrez is a 40 y.o. female with medical history significant of pulmonary embolism, sickle cell disease presenting to the ER with chest wall pain and leg pain that started about 2 days ago.  Patient has taken home regiment but no relief.  Pain is rated as 9 out of 10.  Going down her legs.  Denied any fever or chills denied any nausea vomiting or diarrhea.  Patient was seen in the ER was treated with up to 5 mg of IV Dilaudid with no relief.  She is therefore being admitted to the hospital for further evaluation and treatment.  Denied any nausea vomiting.  Denies any fever..  ED Course: Temperature 99.1 blood pressure 85/53 pulse 102 respirate of 20 and oxygen sats 93% room air.  White count is 14.0, hemoglobin 7.0 and platelets 499.  Chemistry largely within normal.  Patient being admitted with sickle cell painful crisis.  Review of Systems: As per HPI otherwise 10 point review of systems negative.    Past Medical History:  Diagnosis Date  . Asthma   . Eczema   . History of pulmonary embolus (PE)   . Sickle cell anemia (HCC)     Past Surgical History:  Procedure Laterality Date  . CHOLECYSTECTOMY    . ERCP    . JOINT REPLACEMENT    . PORTA CATH INSERTION    . TUBAL LIGATION    . WISDOM TOOTH EXTRACTION       reports that she has never smoked. She has never used smokeless tobacco. She reports that she does not drink alcohol and does not use drugs.  Allergies  Allergen Reactions  . Cefaclor Hives and Swelling  . Hydroxyurea Palpitations and Other (See Comments)    Lowers "blood levels" and heart rate (causes HYPOtension); "it messes me up, it drops my levels and stuff"   . Omeprazole Anaphylaxis and Other (See  Comments)    Causes "sharp pains in the stomach"  . Ketamine Palpitations and Other (See Comments)    "Pt states she has had previous reaction to ketamine. States she becomes flushed, heart races, dizzy, and feels like she is going to pass out."    Family History  Problem Relation Age of Onset  . Renal Disease Mother   . Hypertension Mother   . High Cholesterol Mother      Prior to Admission medications   Medication Sig Start Date End Date Taking? Authorizing Provider  albuterol (VENTOLIN HFA) 108 (90 Base) MCG/ACT inhaler Inhale 2 puffs into the lungs 2 (two) times daily as needed for wheezing or shortness of breath. 04/13/20 04/13/21 Yes King, Diona Foley, NP  budesonide-formoterol (SYMBICORT) 80-4.5 MCG/ACT inhaler Inhale 2 puffs into the lungs 2 (two) times daily. 04/17/20  Yes Vevelyn Francois, NP  celecoxib (CELEBREX) 200 MG capsule Take 1 capsule (200 mg total) by mouth daily. Patient taking differently: Take 200 mg by mouth daily as needed (for pain). 08/02/20 08/02/21 Yes Vevelyn Francois, NP  Cholecalciferol (VITAMIN D3) 25 MCG (1000 UT) CAPS Take 1 capsule (1,000 Units total) by mouth daily. 04/13/20 04/13/21 Yes Vevelyn Francois, NP  Deferasirox (JADENU) 360 MG TABS Take 3 tablets (1,080 mg total) by mouth daily before breakfast. 04/13/20 04/13/21 Yes Vevelyn Francois, NP  diphenhydrAMINE (BENADRYL) 25 mg capsule Take 25 mg by mouth 3 (three) times daily as needed for itching.   Yes [provider]  ergocalciferol (VITAMIN D2) 1.25 MG (50000 UT) capsule Take 1 capsule (50,000 Units total) by mouth once a week. X 12 weeks. Patient taking differently: No sig reported 08/14/20 11/12/20 Yes King, Diona Foley, NP  folic acid (FOLVITE) 1 MG tablet Take 1 tablet (1 mg total) by mouth daily. 04/13/20 04/13/21 Yes Vevelyn Francois, NP  gabapentin (NEURONTIN) 300 MG capsule Take 1 capsule (300 mg total) by mouth 3 (three) times daily. Patient taking differently: Take 300 mg by mouth 3 (three)  times daily as needed (for nerve pain). 04/13/20 04/13/21 Yes King, Diona Foley, NP  HYDROmorphone (DILAUDID) 4 MG tablet Take 1 tablet (4 mg total) by mouth every 6 (six) hours as needed for up to 15 days for moderate pain or severe pain. 10/28/20 11/12/20 Yes Vevelyn Francois, NP  mirtazapine (REMERON) 45 MG tablet Take 1 tablet (45 mg total) by mouth at bedtime. 04/13/20 04/13/21 Yes Vevelyn Francois, NP  omeprazole (PRILOSEC) 20 MG capsule Take 1 capsule (20 mg total) by mouth 2 (two) times daily before a meal. Patient taking differently: Take 20 mg by mouth 2 (two) times daily as needed (for reflux). 08/02/20 08/02/21 Yes King, Diona Foley, NP  polyethylene glycol (MIRALAX / GLYCOLAX) 17 g packet Take 17 g by mouth daily as needed for mild constipation (MIX AND DRINK).   Yes [provider]  promethazine (PHENERGAN) 25 MG tablet Take 0.5-1 tablets (12.5-25 mg total) by mouth every 6 (six) hours as needed for nausea or vomiting. 10/25/20 10/25/21 Yes King, Diona Foley, NP  senna-docusate (SENOKOT-S) 8.6-50 MG tablet Take 1 tablet by mouth at bedtime as needed for mild constipation. 10/19/20  Yes [provider]  vitamin B-12 (CYANOCOBALAMIN) 1000 MCG tablet Take 1 tablet (1,000 mcg total) by mouth daily. 04/13/20 04/13/21 Yes King, Crystal M, NP  XARELTO 20 MG TABS tablet Take 1 tablet (20 mg total) by mouth daily with supper. Patient taking differently: Take 20 mg by mouth daily after supper. 10/25/20 10/25/21 Yes King, Diona Foley, NP  mometasone-formoterol (DULERA) 100-5 MCG/ACT AERO Inhale 2 puffs into the lungs daily as needed for wheezing or shortness of breath. Patient not taking: No sig reported 04/13/20 04/13/21  Vevelyn Francois, NP  OXBRYTA 500 MG TABS tablet Take by mouth. Patient not taking: No sig reported 10/30/20   [provider]    Physical Exam: Vitals:   11/07/20 1645 11/07/20 1700 11/07/20 1745 11/07/20 1800  BP: 113/81 107/71 104/69 109/74  Pulse: 90 94 95 94   Resp: 12 18 18 18   Temp:      TempSrc:      SpO2: 96% 96% 95% 95%  Weight:      Height:          Constitutional: Acutely ill looking in mild distress Vitals:   11/07/20 1645 11/07/20 1700 11/07/20 1745 11/07/20 1800  BP: 113/81 107/71 104/69 109/74  Pulse: 90 94 95 94  Resp: 12 18 18 18   Temp:      TempSrc:      SpO2: 96% 96% 95% 95%  Weight:      Height:       Eyes: PERRL, lids and conjunctivae normal ENMT: Mucous membranes are dry. Posterior pharynx clear of any exudate or lesions.Normal dentition.  Neck: normal, supple, no masses, no thyromegaly Respiratory: clear to auscultation bilaterally, no  wheezing, no crackles. Normal respiratory effort. No accessory muscle use.  Cardiovascular: Regular rate and rhythm, no murmurs / rubs / gallops. No extremity edema. 2+ pedal pulses. No carotid bruits.  Abdomen: no tenderness, no masses palpated. No hepatosplenomegaly. Bowel sounds positive.  Musculoskeletal: no clubbing / cyanosis.  Low back tenderness, no joint deformity upper and lower extremities. Good ROM, no contractures. Normal muscle tone.  Skin: no rashes, lesions, ulcers. No induration Neurologic: CN 2-12 grossly intact. Sensation intact, DTR normal. Strength 5/5 in all 4.  Psychiatric: Normal judgment and insight. Alert and oriented x 3.  Depressed mood.     Labs on Admission: I have personally reviewed following labs and imaging studies  CBC: Recent Labs  Lab 11/07/20 1503  WBC 12.7*  NEUTROABS 8.6*  HGB 6.8*  HCT 20.0*  MCV 87.3  PLT 562*   Basic Metabolic Panel: Recent Labs  Lab 11/07/20 1503  NA 137  K 3.7  CL 109  CO2 24  GLUCOSE 123*  BUN 13  CREATININE 0.63  CALCIUM 9.0   GFR: Estimated Creatinine Clearance: 78.1 mL/min (by C-G formula based on SCr of 0.63 mg/dL). Liver Function Tests: Recent Labs  Lab 11/07/20 1503  AST 33  ALT 9  ALKPHOS 51  BILITOT 2.7*  PROT 7.8  ALBUMIN 4.3   No results for input(s): LIPASE, AMYLASE in the  last 168 hours. No results for input(s): AMMONIA in the last 168 hours. Coagulation Profile: No results for input(s): INR, PROTIME in the last 168 hours. Cardiac Enzymes: No results for input(s): CKTOTAL, CKMB, CKMBINDEX, TROPONINI in the last 168 hours. BNP (last 3 results) No results for input(s): PROBNP in the last 8760 hours. HbA1C: No results for input(s): HGBA1C in the last 72 hours. CBG: No results for input(s): GLUCAP in the last 168 hours. Lipid Profile: No results for input(s): CHOL, HDL, LDLCALC, TRIG, CHOLHDL, LDLDIRECT in the last 72 hours. Thyroid Function Tests: No results for input(s): TSH, T4TOTAL, FREET4, T3FREE, THYROIDAB in the last 72 hours. Anemia Panel: Recent Labs    11/07/20 1503  RETICCTPCT 8.2*   Urine analysis:    Component Value Date/Time   COLORURINE YELLOW 08/14/2019 1800   APPEARANCEUR CLEAR 08/14/2019 1800   LABSPEC 1.011 08/14/2019 1800   PHURINE 7.0 08/14/2019 1800   GLUCOSEU NEGATIVE 08/14/2019 1800   HGBUR SMALL (A) 08/14/2019 1800   BILIRUBINUR neg 08/03/2020 1429   KETONESUR negative 05/24/2020 1429   KETONESUR NEGATIVE 08/14/2019 1800   PROTEINUR Positive (A) 08/03/2020 1429   PROTEINUR 30 (A) 08/14/2019 1800   UROBILINOGEN 1.0 08/03/2020 1429   NITRITE positive 08/03/2020 1429   NITRITE NEGATIVE 08/14/2019 1800   LEUKOCYTESUR Negative 08/03/2020 1429   LEUKOCYTESUR NEGATIVE 08/14/2019 1800   Sepsis Labs: @LABRCNTIP (procalcitonin:4,lacticidven:4) )No results found for this or any previous visit (from the past 240 hour(s)).   Radiological Exams on Admission: DG Chest 2 View  Result Date: 11/07/2020 CLINICAL DATA:  Chest pain, LEFT leg pain for 2 weeks, sickle cell pain EXAM: CHEST - 2 VIEW COMPARISON:  09/21/2020 FINDINGS: BILATERAL jugular Port-A-Caths, RIGHT tip projecting over SVC, LEFT tip projecting over cavoatrial junction. Enlargement of cardiac silhouette with pulmonary vascular congestion. Mediastinal contours normal.  Minimal chronic interstitial prominence unchanged. No acute infiltrate, pleural effusion, or pneumothorax. No acute osseous findings. IMPRESSION: Enlargement of cardiac silhouette with pulmonary vascular congestion. No acute abnormalities. Electronically Signed   By: Lavonia Dana M.D.   On: 11/07/2020 15:46      Assessment/Plan Active Problems:  Sickle cell disease with crisis ([place]) Pulmonary embolism Anemia of chronic disease Chronic pain syndrome   #1 sickle cell disease with crisis: Patient will be admitted.  Initiate Dilaudid PCA, Toradol, IV fluids and supportive care.  Frequent pain assessment and adjustment of medications.  #2 anemia of chronic disease: Continue to monitor H&H.  #3 history of pulmonary embolism: On Xarelto.  Continue  #4 chronic pain syndrome: Continue home regimen.   DVT prophylaxis: Xarelto Code Status: Full code Family Communication: No family at bedside Disposition Plan: Home Consults called: None Admission status: Inpatient  Severity of Illness: The appropriate patient status for this patient is INPATIENT. Inpatient status is judged to be reasonable and necessary in order to provide the required intensity of service to ensure the patient's safety. The patient's presenting symptoms, physical exam findings, and initial radiographic and laboratory data in the context of their chronic comorbidities is felt to place them at high risk for further clinical deterioration. Furthermore, it is not anticipated that the patient will be medically stable for discharge from the hospital within 2 midnights of admission. The following factors support the patient status of inpatient.   " The patient's presenting symptoms include pain in the chest wall and back. " The worrisome physical exam findings include dry mucous membranes. " The initial radiographic and laboratory data are worrisome because of hemoglobin 7.0. " The chronic co-morbidities include sickle cell  disease.   * I certify that at the point of admission it is my clinical judgment that the patient will require inpatient hospital care spanning beyond 2 midnights from the point of admission due to high intensity of service, high risk for further deterioration and high frequency of surveillance required.Barbette Merino MD [facility] Pager [phone]  If 7PM-7AM, please contact night-coverage [email or web address] Password [facility]  11/07/2020, 6:32 PM

## 2020-11-07 NOTE — ED Notes (Signed)
Patient request lab draw port.

## 2020-11-07 NOTE — ED Notes (Signed)
Called blood bank for blood. Not available yet.

## 2020-11-07 NOTE — ED Triage Notes (Signed)
Patient c/o sickle cell pain, chest pain, and left leg pain x 2 weeks.

## 2020-11-07 NOTE — ED Notes (Signed)
ED TO INPATIENT HANDOFF REPORT  Name/Age/Gender [facility] 40 y.o. female  Code Status    Code Status Orders  (From admission, onward)         Start     Ordered   11/07/20 2109  Full code  Continuous        11/07/20 2108        Code Status History    Date Active Date Inactive Code Status Order ID Comments User Context   10/05/2020 2218 10/10/2020 2116 Full Code [id number]  Elwyn Reach, MD Inpatient   08/23/2020 1205 08/23/2020 2225 Full Code [id number]  [name], [place] Inpatient   04/24/2020 2244 04/28/2020 2359 Full Code [id number]  Kayleen Memos, DO ED   04/21/2020 1042 04/21/2020 2145 Full Code [id number]  [name], [place] Inpatient   03/31/2020 1625 04/04/2020 2311 Full Code [id number]  [name], [place] Inpatient   03/31/2020 1048 03/31/2020 1625 Full Code [id number]  [name], [place] Inpatient   02/29/2020 1808 03/05/2020 2311 Full Code [id number]  [name], [place] Inpatient   01/07/2020 1339 01/12/2020 2253 Full Code [id number]  [name], MD Inpatient   07/29/2019 0312 08/04/2019 1845 Full Code [id number]  Edmonia Lynch, DO ED   06/27/2019 0602 07/02/2019 2229 Full Code [id number]  [name], MD ED   05/26/2019 1646 06/01/2019 2241 Full Code [id number]  [name], [place] Inpatient   05/05/2019 0116 05/15/2019 2110 Full Code [id number]  [name], MD Inpatient   04/19/2019 1144 04/26/2019 2031 Full Code [id number]  [name], FNP Inpatient   Advance Care Planning Activity      Home/SNF/Other Home  Chief Complaint Sickle cell disease with crisis ([place]) [D57.00]  Level of Care/Admitting Diagnosis ED Disposition    ED Disposition Condition [place]: [facility] [100102]  Level of Care: Med-Surg [16]  May admit patient to [name] or [name] if equivalent level of care is available:: No  Covid Evaluation: Asymptomatic Screening Protocol (No Symptoms)  Diagnosis:  Sickle cell disease with crisis [facility]) [[id number]]  Admitting Physician: Elwyn Reach [[id number]]  Attending Physician: Elwyn Reach [[id number]]  Estimated length of stay: past midnight tomorrow  Certification:: I certify this patient will need inpatient services for at least 2 midnights       Medical History Past Medical History:  Diagnosis Date  . Asthma   . Eczema   . History of pulmonary embolus (PE)   . Sickle cell anemia (HCC)     Allergies Allergies  Allergen Reactions  . Cefaclor Hives and Swelling  . Hydroxyurea Palpitations and Other (See Comments)    Lowers "blood levels" and heart rate (causes HYPOtension); "it messes me up, it drops my levels and stuff"   . Omeprazole Anaphylaxis and Other (See Comments)    Causes "sharp pains in the stomach"  . Ketamine Palpitations and Other (See Comments)    "Pt states she has had previous reaction to ketamine. States she becomes flushed, heart races, dizzy, and feels like she is going to pass out."    IV Location/Drains/Wounds Patient Lines/Drains/Airways Status    Active Line/Drains/Airways    Name Placement date Placement time Site Days   Implanted Port 01/06/20 01/06/20  0542  --  306          Labs/Imaging Results for orders placed or performed during the hospital encounter of 11/07/20 (from the past 48 hour(s))  Comprehensive  metabolic panel     Status: Abnormal   Collection Time: 11/07/20  3:03 PM  Result Value Ref Range   Sodium 137 135 - 145 mmol/L   Potassium 3.7 3.5 - 5.1 mmol/L   Chloride 109 98 - 111 mmol/L   CO2 24 22 - 32 mmol/L   Glucose, Bld 123 (H) 70 - 99 mg/dL    Comment: Glucose reference range applies only to samples taken after fasting for at least 8 hours.   BUN 13 6 - 20 mg/dL   Creatinine, Ser 0.63 0.44 - 1.00 mg/dL   Calcium 9.0 8.9 - 10.3 mg/dL   Total Protein 7.8 6.5 - 8.1 g/dL   Albumin 4.3 3.5 - 5.0 g/dL   AST 33 15 - 41 U/L   ALT 9 0 - 44 U/L   Alkaline Phosphatase 51 38 - 126 U/L    Total Bilirubin 2.7 (H) 0.3 - 1.2 mg/dL   GFR, Estimated >60 >60 mL/min    Comment: (NOTE) Calculated using the CKD-EPI Creatinine Equation (2021)    Anion gap 4 (L) 5 - 15    Comment: Performed at [facility], [place] [address] [place], [place] [address]  CBC with Differential     Status: Abnormal   Collection Time: 11/07/20  3:03 PM  Result Value Ref Range   WBC 12.7 (H) 4.0 - 10.5 K/uL   RBC 2.29 (L) 3.87 - 5.11 MIL/uL   Hemoglobin 6.8 (LL) 12.0 - 15.0 g/dL    Comment: This critical result has verified and been called to [facility] RN by Karin Golden on 05 24 2022 at 1529, and has been read back. CRITICAL HGB   HCT 20.0 (L) 36.0 - 46.0 %   MCV 87.3 80.0 - 100.0 fL   MCH 29.7 26.0 - 34.0 pg   MCHC 34.0 30.0 - 36.0 g/dL   RDW 22.4 (H) 11.5 - 15.5 %   Platelets 508 (H) 150 - 400 K/uL   nRBC 0.3 (H) 0.0 - 0.2 %   Neutrophils Relative % 67 %   Neutro Abs 8.6 (H) 1.7 - 7.7 K/uL   Lymphocytes Relative 18 %   Lymphs Abs 2.3 0.7 - 4.0 K/uL   Monocytes Relative 13 %   Monocytes Absolute 1.6 (H) 0.1 - 1.0 K/uL   Eosinophils Relative 1 %   Eosinophils Absolute 0.1 0.0 - 0.5 K/uL   Basophils Relative 0 %   Basophils Absolute 0.0 0.0 - 0.1 K/uL   Immature Granulocytes 1 %   Abs Immature Granulocytes 0.07 0.00 - 0.07 K/uL    Comment: Performed at [facility], [place] [address], [place], [place] [address]  Reticulocytes     Status: Abnormal   Collection Time: 11/07/20  3:03 PM  Result Value Ref Range   Retic Ct Pct 8.2 (H) 0.4 - 3.1 %   RBC. 2.25 (L) 3.87 - 5.11 MIL/uL   Retic Count, Absolute 184.1 19.0 - 186.0 K/uL   Immature Retic Fract 18.7 (H) 2.3 - 15.9 %    Comment: Performed at [facility], [place] [address] [place], [place] [address]  Troponin I (High Sensitivity)     Status: None   Collection Time: 11/07/20  3:03 PM  Result Value Ref Range   Troponin I (High Sensitivity) 2 <18 ng/L    Comment: (NOTE) Elevated high  sensitivity troponin I (hsTnI) values and significant  changes across serial measurements may suggest ACS but many other  chronic and acute  conditions are known to elevate hsTnI results.  Refer to the "Links" section for chest pain algorithms and additional  guidance. Performed at [facility], [place] [address] [place], [place] [address]   Troponin I (High Sensitivity)     Status: None   Collection Time: 11/07/20  4:30 PM  Result Value Ref Range   Troponin I (High Sensitivity) <2 <18 ng/L    Comment: (NOTE) Elevated high sensitivity troponin I (hsTnI) values and significant  changes across serial measurements may suggest ACS but many other  chronic and acute conditions are known to elevate hsTnI results.  Refer to the "Links" section for chest pain algorithms and additional  guidance. Performed at [facility], [place] [address], [place], [place] [address]   I-Stat beta hCG blood, ED     Status: None   Collection Time: 11/07/20  5:35 PM  Result Value Ref Range   I-stat hCG, quantitative <5.0 <5 mIU/mL   Comment 3            Comment:   GEST. AGE      CONC.  (mIU/mL)   <=1 WEEK        5 - 50     2 WEEKS       50 - 500     3 WEEKS       100 - 10,000     4 WEEKS     1,000 - 30,000        FEMALE AND NON-PREGNANT FEMALE:     LESS THAN 5 mIU/mL   Type and screen [place]     Status: None   Collection Time: 11/07/20  6:35 PM  Result Value Ref Range   ABO/RH(D) A POS    Antibody Screen NEG    Sample Expiration      11/10/2020,2359 Performed at [facility], [place] [address] [place], [place] [address]   Resp Panel by RT-PCR (Flu A&B, Covid) Nasopharyngeal Swab     Status: None   Collection Time: 11/07/20  9:01 PM   Specimen: Nasopharyngeal Swab; Nasopharyngeal(NP) swabs in vial transport medium  Result Value Ref Range   SARS Coronavirus 2 by RT PCR NEGATIVE NEGATIVE    Comment: (NOTE) SARS-CoV-2 target  nucleic acids are NOT DETECTED.  The SARS-CoV-2 RNA is generally detectable in upper respiratory specimens during the acute phase of infection. The lowest concentration of SARS-CoV-2 viral copies this assay can detect is 138 copies/mL. A negative result does not preclude SARS-Cov-2 infection and should not be used as the sole basis for treatment or other patient management decisions. A negative result may occur with  improper specimen collection/handling, submission of specimen other than nasopharyngeal swab, presence of viral mutation(s) within the areas targeted by this assay, and inadequate number of viral copies(<138 copies/mL). A negative result must be combined with clinical observations, patient history, and epidemiological information. The expected result is Negative.  Fact Sheet for Patients:  EntrepreneurPulse.com.au  Fact Sheet for Healthcare Providers:  IncredibleEmployment.be  This test is no t yet approved or cleared by the [place] FDA and  has been authorized for detection and/or diagnosis of SARS-CoV-2 by FDA under an Emergency Use Authorization (EUA). This EUA will remain  in effect (meaning this test can be used) for the duration of the COVID-19 declaration under Section 564(b)(1) of the Act, 21 U.S.C.section 360bbb-3(b)(1), unless the authorization is terminated  or revoked sooner.       Influenza A  by PCR NEGATIVE NEGATIVE   Influenza B by PCR NEGATIVE NEGATIVE    Comment: (NOTE) The Xpert Xpress SARS-CoV-2/FLU/RSV plus assay is intended as an aid in the diagnosis of influenza from Nasopharyngeal swab specimens and should not be used as a sole basis for treatment. Nasal washings and aspirates are unacceptable for Xpert Xpress SARS-CoV-2/FLU/RSV testing.  Fact Sheet for Patients: EntrepreneurPulse.com.au  Fact Sheet for Healthcare Providers: IncredibleEmployment.be  This test  is not yet approved or cleared by the [place] FDA and has been authorized for detection and/or diagnosis of SARS-CoV-2 by FDA under an Emergency Use Authorization (EUA). This EUA will remain in effect (meaning this test can be used) for the duration of the COVID-19 declaration under Section 564(b)(1) of the Act, 21 U.S.C. section 360bbb-3(b)(1), unless the authorization is terminated or revoked.  Performed at [facility], [place] [address], [place], [place] [address]   CBC with Differential/Platelet     Status: Abnormal   Collection Time: 11/07/20  9:09 PM  Result Value Ref Range   WBC 14.0 (H) 4.0 - 10.5 K/uL   RBC 2.37 (L) 3.87 - 5.11 MIL/uL   Hemoglobin 7.0 (L) 12.0 - 15.0 g/dL   HCT 20.4 (L) 36.0 - 46.0 %   MCV 86.1 80.0 - 100.0 fL   MCH 29.5 26.0 - 34.0 pg   MCHC 34.3 30.0 - 36.0 g/dL   RDW 22.6 (H) 11.5 - 15.5 %   Platelets 499 (H) 150 - 400 K/uL   nRBC 0.2 0.0 - 0.2 %   Neutrophils Relative % 57 %   Neutro Abs 7.9 (H) 1.7 - 7.7 K/uL   Lymphocytes Relative 26 %   Lymphs Abs 3.7 0.7 - 4.0 K/uL   Monocytes Relative 16 %   Monocytes Absolute 2.2 (H) 0.1 - 1.0 K/uL   Eosinophils Relative 1 %   Eosinophils Absolute 0.2 0.0 - 0.5 K/uL   Basophils Relative 0 %   Basophils Absolute 0.0 0.0 - 0.1 K/uL   Immature Granulocytes 0 %   Abs Immature Granulocytes 0.05 0.00 - 0.07 K/uL   Pappenheimer Bodies PRESENT    Sickle Cells PRESENT    Target Cells PRESENT     Comment: Performed at [facility], [place] [address] [place], [place] [address]  Comprehensive metabolic panel     Status: Abnormal   Collection Time: 11/07/20  9:09 PM  Result Value Ref Range   Sodium 137 135 - 145 mmol/L   Potassium 3.9 3.5 - 5.1 mmol/L   Chloride 107 98 - 111 mmol/L   CO2 21 (L) 22 - 32 mmol/L   Glucose, Bld 96 70 - 99 mg/dL    Comment: Glucose reference range applies only to samples taken after fasting for at least 8 hours.   BUN 10 6 - 20 mg/dL    Creatinine, Ser 0.57 0.44 - 1.00 mg/dL   Calcium 8.9 8.9 - 10.3 mg/dL   Total Protein 7.8 6.5 - 8.1 g/dL   Albumin 4.4 3.5 - 5.0 g/dL   AST 30 15 - 41 U/L   ALT 10 0 - 44 U/L   Alkaline Phosphatase 49 38 - 126 U/L   Total Bilirubin 2.6 (H) 0.3 - 1.2 mg/dL   GFR, Estimated >60 >60 mL/min    Comment: (NOTE) Calculated using the CKD-EPI Creatinine Equation (2021)    Anion gap 9 5 - 15    Comment: Performed at [facility], [place] [address] [place], [place]  [address]   DG Chest 2 View  Result Date: 11/07/2020 CLINICAL DATA:  Chest pain, LEFT leg pain for 2 weeks, sickle cell pain EXAM: CHEST - 2 VIEW COMPARISON:  09/21/2020 FINDINGS: BILATERAL jugular Port-A-Caths, RIGHT tip projecting over SVC, LEFT tip projecting over cavoatrial junction. Enlargement of cardiac silhouette with pulmonary vascular congestion. Mediastinal contours normal. Minimal chronic interstitial prominence unchanged. No acute infiltrate, pleural effusion, or pneumothorax. No acute osseous findings. IMPRESSION: Enlargement of cardiac silhouette with pulmonary vascular congestion. No acute abnormalities. Electronically Signed   By: Lavonia Dana M.D.   On: 11/07/2020 15:46    Pending Labs Unresulted Labs (From admission, onward)          Start     Ordered   11/08/20 0500  Comprehensive metabolic panel  Tomorrow morning,   R        11/07/20 2108   11/08/20 0500  CBC with Differential/Platelet  Tomorrow morning,   R        11/07/20 2108   11/07/20 2109  Reticulocytes  Once,   STAT       Question:  Specimen collection method  Answer:  IV Team   11/07/20 2108          Vitals/Pain Today's Vitals   11/07/20 1800 11/07/20 2045 11/07/20 2100 11/07/20 2140  BP: 109/74 96/67 95/66  103/69  Pulse: 94 87 83 78  Resp: 18   14  Temp:      TempSrc:      SpO2: 95% 94% 95% 97%  Weight:      Height:      PainSc:        Isolation Precautions Airborne and Contact precautions  Medications Medications   promethazine (PHENERGAN) 25 mg in sodium chloride 0.9 % 50 mL IVPB (has no administration in time range)  0.9 %  sodium chloride infusion (has no administration in time range)  diphenhydrAMINE (BENADRYL) capsule 25 mg (25 mg Oral Given 11/07/20 2128)  cholecalciferol (VITAMIN D3) tablet 1,000 Units (has no administration in time range)  folic acid (FOLVITE) tablet 1 mg (has no administration in time range)  gabapentin (NEURONTIN) capsule 300 mg (300 mg Oral Given 11/07/20 2128)  vitamin B-12 (CYANOCOBALAMIN) tablet 1,000 mcg (has no administration in time range)  mirtazapine (REMERON) tablet 45 mg (has no administration in time range)  Deferasirox TABS 1,080 mg (has no administration in time range)  mometasone-formoterol (DULERA) 100-5 MCG/ACT inhaler 2 puff (has no administration in time range)  pantoprazole (PROTONIX) EC tablet 40 mg (has no administration in time range)  rivaroxaban (XARELTO) tablet 20 mg (has no administration in time range)  promethazine (PHENERGAN) tablet 12.5-25 mg (has no administration in time range)  voxelotor (OXBRYTA) tablet 1,500 mg (has no administration in time range)  senna-docusate (Senokot-S) tablet 1 tablet (has no administration in time range)  senna-docusate (Senokot-S) tablet 1 tablet (1 tablet Oral Given 11/07/20 2129)  polyethylene glycol (MIRALAX / GLYCOLAX) packet 17 g (has no administration in time range)  dextrose 5 %-0.45 % sodium chloride infusion ( Intravenous New Bag/Given 11/07/20 2129)  ketorolac (TORADOL) 30 MG/ML injection 30 mg (has no administration in time range)  albuterol (PROVENTIL) (2.5 MG/3ML) 0.083% nebulizer solution 2.5 mg (has no administration in time range)  HYDROmorphone (DILAUDID) injection 2 mg (has no administration in time range)  sodium chloride 0.9 % bolus 1,000 mL (0 mLs Intravenous Stopped 11/07/20 1752)  HYDROmorphone (DILAUDID) injection 1 mg (1 mg Intravenous Given 11/07/20 1537)  diphenhydrAMINE (BENADRYL) injection 25  mg (25 mg Intravenous Given 11/07/20 1538)  HYDROmorphone (DILAUDID) injection 2 mg (2 mg Intravenous Given 11/07/20 1623)  HYDROmorphone (DILAUDID) injection 2 mg (2 mg Intravenous Given 11/07/20 1726)  diphenhydrAMINE (BENADRYL) injection 25 mg (25 mg Intravenous Given 11/07/20 1725)    Mobility walks

## 2020-11-07 NOTE — ED Notes (Signed)
Patient is resting in bed, playing video games.  No distress noted

## 2020-11-07 NOTE — ED Provider Notes (Addendum)
[facility] DEPT Provider Note   CSN: [id number] Arrival date & time: 11/07/20  1410     History Chief Complaint  Patient presents with  . Sickle Cell Pain Crisis    Molly Gutierrez is a 40 y.o. female.  Patient complains of chest pain and leg pain secondary to sickle cell crisis patient states that this is typical for her pain.  No fevers no chills no vomiting  The history is provided by the patient and medical records. No language interpreter was used.  Sickle Cell Pain Crisis Pain location: Patient complains of pain in his chest and lower legs. Severity:  Moderate Onset quality:  Sudden Similar to previous crisis episodes: yes   Timing:  Constant Progression:  Waxing and waning Chronicity:  New Sickle cell genotype:  SS History of pulmonary emboli: no   Context: not alcohol consumption   Associated symptoms: no chest pain, no congestion, no cough, no fatigue and no headaches        Past Medical History:  Diagnosis Date  . Asthma   . Eczema   . History of pulmonary embolus (PE)   . Sickle cell anemia [facility])     Patient Active Problem List   Diagnosis Date Noted  . Sickle cell disease with crisis ([place]) 10/05/2020  . 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 Sickle cell anemia with pain ([place]) 04/24/2020  . Mild intermittent asthma 03/31/2020  . Sickle cell anemia with crisis ([place]) 01/07/2020  . Sickle cell crisis acute chest syndrome ([place]) 07/29/2019  . Drug-seeking behavior 07/07/2019  . Sinus tachycardia 07/07/2019  . Therapeutic opioid-induced constipation (OIC) 07/07/2019  . Breast mass in female 06/29/2019  . Atelectasis of right lung 06/29/2019  . Sickle cell crisis ([place]) 06/27/2019  . Sickle cell pain crisis ([place]) 04/19/2019  . Pneumonia 03/27/2019  . 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 Anterior chest wall pain 11/13/2018  . Epigastric abdominal pain 11/13/2018  . Hematuria 11/13/2018  . Hyperbilirubinemia 11/12/2018   . Long term current use of anticoagulant therapy 11/12/2018  . History of pulmonary embolism 09/26/2018  . Acute pulmonary embolism ([place]) 09/26/2018  . Opioid dependence ([place]) 09/13/2018  . Port-A-Cath in place 09/13/2018  . Narcotic abuse, continuous ([place]) 08/28/2018  . Anemia 07/03/2018  . Personal history of other venous thrombosis and embolism 07/03/2018  . History of transfusion 07/03/2018  . Gastro-esophageal reflux disease without esophagitis 06/02/2018  . Asthma 05/19/2018  . Anxiety and depression 10/30/2017  . At risk for sepsis 06/13/2017  . Mitral regurgitation 02/01/2014  . Itching 12/13/2013  . Nausea & vomiting 12/13/2013  . 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 Frequent complaints of pain 11/01/2013  . Hypokalemia 09/19/2013  . S/P total hip arthroplasty 05/19/2013  . 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 Lower urinary tract infectious disease 03/02/2013  . Chest pain 11/14/2012  . Sickle-cell anemia ([place]) 10/30/2012  . Pain management 08/01/2012  . Reticulocytosis 07/31/2012  . 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 Essential (hemorrhagic) thrombocythemia ([place]) 04/03/2012  . Leukocytosis 03/27/2012  . History of gestational diabetes 10/07/2011  . Hb-SS disease with crisis, unspecified ([place]) 06/25/2010    Past Surgical History:  Procedure Laterality Date  . CHOLECYSTECTOMY    . ERCP    . JOINT REPLACEMENT    . PORTA CATH INSERTION    . TUBAL LIGATION    . WISDOM TOOTH EXTRACTION       OB  History   No obstetric history on file.     Family History  Problem Relation Age of Onset  . Renal Disease Mother   . Hypertension Mother   . High Cholesterol Mother     Social History   Tobacco Use  . Smoking status: Never Smoker  . Smokeless tobacco: Never Used  Vaping Use  . Vaping Use: Never used  Substance Use Topics  . Alcohol use: Never  .  Drug use: Never    Home Medications Prior to Admission medications   Medication Sig Start Date End Date Taking? Authorizing Provider  albuterol (VENTOLIN HFA) 108 (90 Base) MCG/ACT inhaler Inhale 2 puffs into the lungs 2 (two) times daily as needed for wheezing or shortness of breath. 04/13/20 04/13/21 Yes King, Diona Foley, NP  budesonide-formoterol (SYMBICORT) 80-4.5 MCG/ACT inhaler Inhale 2 puffs into the lungs 2 (two) times daily. 04/17/20  Yes Vevelyn Francois, NP  celecoxib (CELEBREX) 200 MG capsule Take 1 capsule (200 mg total) by mouth daily. Patient taking differently: Take 200 mg by mouth daily as needed (for pain). 08/02/20 08/02/21 Yes Vevelyn Francois, NP  Cholecalciferol (VITAMIN D3) 25 MCG (1000 UT) CAPS Take 1 capsule (1,000 Units total) by mouth daily. 04/13/20 04/13/21 Yes Vevelyn Francois, NP  Deferasirox (JADENU) 360 MG TABS Take 3 tablets (1,080 mg total) by mouth daily before breakfast. 04/13/20 04/13/21 Yes Vevelyn Francois, NP  diphenhydrAMINE (BENADRYL) 25 mg capsule Take 25 mg by mouth 3 (three) times daily as needed for itching.   Yes [provider]  ergocalciferol (VITAMIN D2) 1.25 MG (50000 UT) capsule Take 1 capsule (50,000 Units total) by mouth once a week. X 12 weeks. Patient taking differently: No sig reported 08/14/20 11/12/20 Yes King, Diona Foley, NP  folic acid (FOLVITE) 1 MG tablet Take 1 tablet (1 mg total) by mouth daily. 04/13/20 04/13/21 Yes Vevelyn Francois, NP  gabapentin (NEURONTIN) 300 MG capsule Take 1 capsule (300 mg total) by mouth 3 (three) times daily. Patient taking differently: Take 300 mg by mouth 3 (three) times daily as needed (for nerve pain). 04/13/20 04/13/21 Yes King, Diona Foley, NP  HYDROmorphone (DILAUDID) 4 MG tablet Take 1 tablet (4 mg total) by mouth every 6 (six) hours as needed for up to 15 days for moderate pain or severe pain. 10/28/20 11/12/20 Yes Vevelyn Francois, NP  mirtazapine (REMERON) 45 MG tablet Take 1 tablet (45 mg total) by  mouth at bedtime. 04/13/20 04/13/21 Yes Vevelyn Francois, NP  omeprazole (PRILOSEC) 20 MG capsule Take 1 capsule (20 mg total) by mouth 2 (two) times daily before a meal. Patient taking differently: Take 20 mg by mouth 2 (two) times daily as needed (for reflux). 08/02/20 08/02/21 Yes King, Diona Foley, NP  polyethylene glycol (MIRALAX / GLYCOLAX) 17 g packet Take 17 g by mouth daily as needed for mild constipation (MIX AND DRINK).   Yes [provider]  promethazine (PHENERGAN) 25 MG tablet Take 0.5-1 tablets (12.5-25 mg total) by mouth every 6 (six) hours as needed for nausea or vomiting. 10/25/20 10/25/21 Yes King, Diona Foley, NP  senna-docusate (SENOKOT-S) 8.6-50 MG tablet Take 1 tablet by mouth at bedtime as needed for mild constipation. 10/19/20  Yes [provider]  vitamin B-12 (CYANOCOBALAMIN) 1000 MCG tablet Take 1 tablet (1,000 mcg total) by mouth daily. 04/13/20 04/13/21 Yes King, Crystal M, NP  XARELTO 20 MG TABS tablet Take 1 tablet (20 mg total) by mouth daily with supper. Patient  taking differently: Take 20 mg by mouth daily after supper. 10/25/20 10/25/21 Yes King, Diona Foley, NP  mometasone-formoterol (DULERA) 100-5 MCG/ACT AERO Inhale 2 puffs into the lungs daily as needed for wheezing or shortness of breath. Patient not taking: No sig reported 04/13/20 04/13/21  Vevelyn Francois, NP  OXBRYTA 500 MG TABS tablet Take by mouth. Patient not taking: No sig reported 10/30/20   [provider]    Allergies    Cefaclor, Hydroxyurea, Omeprazole, and Ketamine  Review of Systems   Review of Systems  Constitutional: Negative for appetite change and fatigue.  HENT: Negative for congestion, ear discharge and sinus pressure.   Eyes: Negative for discharge.  Respiratory: Negative for cough.   Cardiovascular: Negative for chest pain.  Gastrointestinal: Negative for abdominal pain and diarrhea.  Genitourinary: Negative for frequency and hematuria.  Musculoskeletal: Negative  for back pain.  Skin: Negative for rash.  Neurological: Negative for seizures and headaches.  Psychiatric/Behavioral: Negative for hallucinations.    Physical Exam Updated Vital Signs BP 109/74 (BP Location: Left Arm)   Pulse 94   Temp 98.8 F (37.1 C) (Oral)   Resp 18   Ht 5\' 3"  (1.6 m)   Wt 57.6 kg   LMP 10/24/2020   SpO2 95%   BMI 22.50 kg/m   Physical Exam Constitutional:      Appearance: She is well-developed. She is ill-appearing.  HENT:     Head: Normocephalic.     Nose: Nose normal.  Eyes:     General: No scleral icterus.    Conjunctiva/sclera: Conjunctivae normal.  Neck:     Thyroid: No thyromegaly.  Cardiovascular:     Rate and Rhythm: Normal rate and regular rhythm.     Heart sounds: No murmur heard. No friction rub. No gallop.   Pulmonary:     Breath sounds: No stridor. No wheezing or rales.  Chest:     Chest wall: No tenderness.  Abdominal:     General: There is no distension.     Tenderness: There is no abdominal tenderness. There is no rebound.  Musculoskeletal:        General: Normal range of motion.     Cervical back: Neck supple.  Lymphadenopathy:     Cervical: No cervical adenopathy.  Skin:    Findings: No erythema or rash.  Neurological:     Mental Status: She is oriented to person, place, and time.     Motor: No abnormal muscle tone.     Coordination: Coordination normal.  Psychiatric:        Behavior: Behavior normal.     ED Results / Procedures / Treatments   Labs (all labs ordered are listed, but only abnormal results are displayed) Labs Reviewed  COMPREHENSIVE METABOLIC PANEL - Abnormal; Notable for the following components:      Result Value   Glucose, Bld 123 (*)    Total Bilirubin 2.7 (*)    Anion gap 4 (*)    All other components within normal limits  CBC WITH DIFFERENTIAL/PLATELET - Abnormal; Notable for the following components:   WBC 12.7 (*)    RBC 2.29 (*)    Hemoglobin 6.8 (*)    HCT 20.0 (*)    RDW 22.4 (*)     Platelets 508 (*)    nRBC 0.3 (*)    Neutro Abs 8.6 (*)    Monocytes Absolute 1.6 (*)    All other components within normal limits  RETICULOCYTES - Abnormal; Notable for the  following components:   Retic Ct Pct 8.2 (*)    RBC. 2.25 (*)    Immature Retic Fract 18.7 (*)    All other components within normal limits  I-STAT BETA HCG BLOOD, ED ([facility], [facility], [facility] ONLY)  TROPONIN I (HIGH SENSITIVITY)  TROPONIN I (HIGH SENSITIVITY)    EKG None  Radiology DG Chest 2 View  Result Date: 11/07/2020 CLINICAL DATA:  Chest pain, LEFT leg pain for 2 weeks, sickle cell pain EXAM: CHEST - 2 VIEW COMPARISON:  09/21/2020 FINDINGS: BILATERAL jugular Port-A-Caths, RIGHT tip projecting over SVC, LEFT tip projecting over cavoatrial junction. Enlargement of cardiac silhouette with pulmonary vascular congestion. Mediastinal contours normal. Minimal chronic interstitial prominence unchanged. No acute infiltrate, pleural effusion, or pneumothorax. No acute osseous findings. IMPRESSION: Enlargement of cardiac silhouette with pulmonary vascular congestion. No acute abnormalities. Electronically Signed   By: Lavonia Dana M.D.   On: 11/07/2020 15:46    Procedures Procedures   Medications Ordered in ED Medications  promethazine (PHENERGAN) 25 mg in sodium chloride 0.9 % 50 mL IVPB (has no administration in time range)  0.9 %  sodium chloride infusion (has no administration in time range)  sodium chloride 0.9 % bolus 1,000 mL (0 mLs Intravenous Stopped 11/07/20 1752)  HYDROmorphone (DILAUDID) injection 1 mg (1 mg Intravenous Given 11/07/20 1537)  diphenhydrAMINE (BENADRYL) injection 25 mg (25 mg Intravenous Given 11/07/20 1538)  HYDROmorphone (DILAUDID) injection 2 mg (2 mg Intravenous Given 11/07/20 1623)  HYDROmorphone (DILAUDID) injection 2 mg (2 mg Intravenous Given 11/07/20 1726)  diphenhydrAMINE (BENADRYL) injection 25 mg (25 mg Intravenous Given 11/07/20 1725)    ED Course  I have reviewed the triage vital signs  and the nursing notes.  Pertinent labs & imaging results that were available during my care of the patient were reviewed by me and considered in my medical decision making (see chart for details).  Patient with chest pain and lower leg pain secondary to sickle cell crisis.  She will be seen by the hospitalist for possible admission   MDM Rules/Calculators/A&P                          Sickle cell crisis Final Clinical Impression(s) / ED Diagnoses Final diagnoses:  None    Rx / DC Orders ED Discharge Orders    None       Milton Ferguson, MD 11/07/20 1831    Milton Ferguson, MD 11/20/20 1155

## 2020-11-07 NOTE — ED Notes (Signed)
Patient in radiology

## 2020-11-08 DIAGNOSIS — D5700 Hb-SS disease with crisis, unspecified: Secondary | ICD-10-CM | POA: Diagnosis not present

## 2020-11-08 LAB — COMPREHENSIVE METABOLIC PANEL
ALT: 11 U/L (ref 0–44)
AST: 28 U/L (ref 15–41)
Albumin: 4.4 g/dL (ref 3.5–5.0)
Alkaline Phosphatase: 51 U/L (ref 38–126)
Anion gap: 8 (ref 5–15)
BUN: 11 mg/dL (ref 6–20)
CO2: 23 mmol/L (ref 22–32)
Calcium: 9.1 mg/dL (ref 8.9–10.3)
Chloride: 106 mmol/L (ref 98–111)
Creatinine, Ser: 0.7 mg/dL (ref 0.44–1.00)
GFR, Estimated: >60 mL/min (ref >60)
Glucose, Bld: 108 mg/dL — ABNORMAL HIGH (ref 70–99)
Potassium: 3.7 mmol/L (ref 3.5–5.1)
Sodium: 137 mmol/L (ref 135–145)
Total Bilirubin: 2.6 mg/dL — ABNORMAL HIGH (ref 0.3–1.2)
Total Protein: 8 g/dL (ref 6.5–8.1)

## 2020-11-08 LAB — CBC WITH DIFFERENTIAL/PLATELET
Abs Immature Granulocytes: 0.08 10*3/uL — ABNORMAL HIGH (ref 0.00–0.07)
Basophils Absolute: 0.1 10*3/uL (ref 0.0–0.1)
Basophils Relative: 0 %
Eosinophils Absolute: 0.2 10*3/uL (ref 0.0–0.5)
Eosinophils Relative: 1 %
HCT: 20.8 % — ABNORMAL LOW (ref 36.0–46.0)
Hemoglobin: 7.2 g/dL — ABNORMAL LOW (ref 12.0–15.0)
Immature Granulocytes: 1 %
Lymphocytes Relative: 28 %
Lymphs Abs: 4.2 10*3/uL — ABNORMAL HIGH (ref 0.7–4.0)
MCH: 30.1 pg (ref 26.0–34.0)
MCHC: 34.6 g/dL (ref 30.0–36.0)
MCV: 87 fL (ref 80.0–100.0)
Monocytes Absolute: 2.8 10*3/uL — ABNORMAL HIGH (ref 0.1–1.0)
Monocytes Relative: 18 %
Neutro Abs: 8 10*3/uL — ABNORMAL HIGH (ref 1.7–7.7)
Neutrophils Relative %: 52 %
Platelets: 511 10*3/uL — ABNORMAL HIGH (ref 150–400)
RBC: 2.39 MIL/uL — ABNORMAL LOW (ref 3.87–5.11)
RDW: 22.2 % — ABNORMAL HIGH (ref 11.5–15.5)
WBC: 15.2 10*3/uL — ABNORMAL HIGH (ref 4.0–10.5)
nRBC: 0.3 % — ABNORMAL HIGH (ref 0.0–0.2)

## 2020-11-08 LAB — RETICULOCYTES
Immature Retic Fract: 12.9 % (ref 2.3–15.9)
RBC.: 2.34 MIL/uL — ABNORMAL LOW (ref 3.87–5.11)
Retic Count, Absolute: 152.8 10*3/uL (ref 19.0–186.0)
Retic Ct Pct: 6.5 % — ABNORMAL HIGH (ref 0.4–3.1)

## 2020-11-08 MED ORDER — DEFERASIROX 360 MG PO TABS: 1080.0000 mg | ORAL_TABLET | Freq: Every day | ORAL | Status: DC

## 2020-11-08 MED ORDER — HYDROMORPHONE HCL 2 MG PO TABS: 4.0000 mg | ORAL_TABLET | Freq: Four times a day (QID) | ORAL | Status: DC | PRN

## 2020-11-08 MED ORDER — SODIUM CHLORIDE 0.9% FLUSH: 10.0000 mL | INTRAVENOUS | Status: DC | PRN

## 2020-11-08 MED ORDER — CHLORHEXIDINE GLUCONATE CLOTH 2 % EX PADS
6.0000 | MEDICATED_PAD | Freq: Every day | CUTANEOUS | Status: DC
  Administered 2020-11-08 – 2020-11-13 (×6): 6 via TOPICAL

## 2020-11-08 NOTE — Progress Notes (Signed)
Subjective: 40 year old female with history of sickle cell disease that was admitted yesterday with sickle cell painful crisis.  Patient still having pain at 7 out of 10.  Mainly in the back and legs.  Denied any fever or chills.  Currently on Dilaudid PCA and Toradol.  Also Dilaudid injections 2 mg every 2 hours as needed.  No nausea vomiting or diarrhea.  Objective: Vital signs in last 24 hours: Temp:  [98 F (36.7 C)-99.1 F (37.3 C)] 99 F (37.2 C) (05/25 0425) Pulse Rate:  [78-105] 91 (05/25 0425) Resp:  [11-20] 15 (05/25 0746) BP: (85-122)/(53-90) 104/70 (05/25 0425) SpO2:  [93 %-97 %] 96 % (05/25 0746) FiO2 (%):  [0 %-96 %] 96 % (05/25 0746) Weight:  [57.6 kg] 57.6 kg (05/24 1428) Weight change:  Last BM Date: 11/06/20  Intake/Output from previous day: 05/24 0701 - 05/25 0700 In: 2203 [P.O.:240; I.V.:1063; IV Piggyback:900] Out: 0  Intake/Output this shift: No intake/output data recorded.  General appearance: alert, cooperative, appears stated age and no distress Neck: no adenopathy, no carotid bruit, no JVD, supple, symmetrical, trachea midline and thyroid not enlarged, symmetric, no tenderness/mass/nodules Back: symmetric, no curvature. ROM normal. No CVA tenderness. Resp: clear to auscultation bilaterally Cardio: regular rate and rhythm, S1, S2 normal, no murmur, click, rub or gallop GI: soft, non-tender; bowel sounds normal; no masses,  no organomegaly Extremities: extremities normal, atraumatic, no cyanosis or edema Pulses: 2+ and symmetric Skin: Skin color, texture, turgor normal. No rashes or lesions Neurologic: Grossly normal  Lab Results: Recent Labs    11/07/20 2109 11/08/20 0100  WBC 14.0* 15.2*  HGB 7.0* 7.2*  HCT 20.4* 20.8*  PLT 499* 511*   BMET Recent Labs    11/07/20 2109 11/08/20 0100  NA 137 137  K 3.9 3.7  CL 107 106  CO2 21* 23  GLUCOSE 96 108*  BUN 10 11  CREATININE 0.57 0.70  CALCIUM 8.9 9.1    Studies/Results: DG Chest 2  View  Result Date: 11/07/2020 CLINICAL DATA:  Chest pain, LEFT leg pain for 2 weeks, sickle cell pain EXAM: CHEST - 2 VIEW COMPARISON:  09/21/2020 FINDINGS: BILATERAL jugular Port-A-Caths, RIGHT tip projecting over SVC, LEFT tip projecting over cavoatrial junction. Enlargement of cardiac silhouette with pulmonary vascular congestion. Mediastinal contours normal. Minimal chronic interstitial prominence unchanged. No acute infiltrate, pleural effusion, or pneumothorax. No acute osseous findings. IMPRESSION: Enlargement of cardiac silhouette with pulmonary vascular congestion. No acute abnormalities. Electronically Signed   By: Lavonia Dana M.D.   On: 11/07/2020 15:46    Medications: I have reviewed the patient's current medications.  Assessment/Plan: 40 year old female with sickle cell disease admitted with sickle cell painful crisis.  #1 sickle cell painful crisis: Patient will be admitted on the Dilaudid PCA and Toradol.  She is on oral Dilaudid at home.  Currently getting 2 mg of Dilaudid every 2 hours as needed.  I will stop that and start the oral Dilaudid instead.  Continue close monitoring.  #2 anemia of chronic disease: Hemoglobin appears to be at baseline.  Continue monitoring.  #3 chronic pain syndrome: Continue home regimen.  #4 chronic anticoagulation: Continue Xarelto.  #5 asthma: Continue breathing treatments.   LOS: 1 day   Marvina Danner,LAWAL 11/08/2020, 8:39 AM

## 2020-11-08 NOTE — TOC Initial Note (Signed)
Transition of Care [facility]) - Initial/Assessment Note   Patient Details  Name: Molly Gutierrez MRN: [id number] Date of Birth: 1981-01-01  [facility]) CM/SW Contact:    Sherie Don, LCSW Phone Number: 11/08/2020, 11:05 AM  Clinical Narrative: Patient is a 40 year old female who was admitted for sickle cell disease with crisis. Readmission checklist completed due to high readmission score.  CSW met with patient to complete assessment. Per patient, she resides at home with 2 children. Patient is independent with her ADLs at baseline. Patient is able to afford her medications each month. Patient has a history of [place] services, but does not think she will need these services at this time. There are no DME needs. Patient transports herself to medical appointments. TOC to follow for possible discharge needs.  Expected Discharge Plan: Home/Self Care Barriers to Discharge: Continued Medical Work up  Patient Goals and CMS Choice Patient states their goals for this hospitalization and ongoing recovery are:: Return home Choice offered to / list presented to : NA  Expected Discharge Plan and Services Expected Discharge Plan: Home/Self Care In-house Referral: Clinical Social Work Post Acute Care Choice: NA Living arrangements for the past 2 months: Apartment              DME Arranged: N/A DME Agency: NA  Prior Living Arrangements/Services Living arrangements for the past 2 months: Apartment Lives with:: Minor Children Patient language and need for interpreter reviewed:: Yes Do you feel safe going back to the place where you live?: Yes      Need for Family Participation in Patient Care: No (Comment) Care giver support system in place?: Yes (comment) Criminal Activity/Legal Involvement Pertinent to Current Situation/Hospitalization: No - Comment as needed  Activities of Daily Living Home Assistive Devices/Equipment: Eyeglasses ADL Screening (condition at time of  admission) Patient's cognitive ability adequate to safely complete daily activities?: Yes Is the patient deaf or have difficulty hearing?: No Does the patient have difficulty seeing, even when wearing glasses/contacts?: No Does the patient have difficulty concentrating, remembering, or making decisions?: No Patient able to express need for assistance with ADLs?: Yes Does the patient have difficulty dressing or bathing?: No Independently performs ADLs?: Yes (appropriate for developmental age) Does the patient have difficulty walking or climbing stairs?: No Weakness of Legs: Both Weakness of Arms/Hands: Both  Emotional Assessment Appearance:: Appears stated age Attitude/Demeanor/Rapport: Engaged Affect (typically observed): Accepting Orientation: : Oriented to Self,Oriented to Place,Oriented to  Time,Oriented to Situation Alcohol / Substance Use: Not Applicable  Admission diagnosis:  Sickle cell disease with crisis [facility]) [D57.00] Patient Active Problem List   Diagnosis Date Noted  . Sickle cell disease with crisis ([place]) 10/05/2020  . 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 Sickle cell anemia with pain ([place]) 04/24/2020  . Mild intermittent asthma 03/31/2020  . Sickle cell anemia with crisis ([place]) 01/07/2020  . Sickle cell crisis acute chest syndrome ([place]) 07/29/2019  . Drug-seeking behavior 07/07/2019  . Sinus tachycardia 07/07/2019  . Therapeutic opioid-induced constipation (OIC) 07/07/2019  . Breast mass in female 06/29/2019  . Atelectasis of right lung 06/29/2019  . Sickle cell crisis ([place]) 06/27/2019  . Sickle cell pain crisis ([place]) 04/19/2019  . Pneumonia 03/27/2019  . 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 Anterior chest wall pain 11/13/2018  . Epigastric abdominal pain 11/13/2018  . Hematuria 11/13/2018  . Hyperbilirubinemia 11/12/2018  . Long term current use of anticoagulant therapy 11/12/2018  . History of pulmonary embolism 09/26/2018  . Hx of pulmonary embolism ([place])  09/26/2018  . Opioid dependence ([place]) 09/13/2018  . Port-A-Cath in place 09/13/2018  . Narcotic abuse, continuous ([place]) 08/28/2018  . Anemia 07/03/2018  . Personal history of other venous thrombosis and embolism 07/03/2018  . History of transfusion 07/03/2018  . Gastro-esophageal reflux disease without esophagitis 06/02/2018  . Asthma 05/19/2018  . Anxiety and depression 10/30/2017  . At risk for sepsis 06/13/2017  . Mitral regurgitation 02/01/2014  . Itching 12/13/2013  . Nausea & vomiting 12/13/2013  . 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 Frequent complaints of pain 11/01/2013  . Hypokalemia 09/19/2013  . S/P total hip arthroplasty 05/19/2013  . 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 Lower urinary tract infectious disease 03/02/2013  . Chest pain 11/14/2012  . Sickle-cell anemia ([place]) 10/30/2012  . Pain management 08/01/2012  . Reticulocytosis 07/31/2012  . 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 Leukocytosis 03/27/2012  . History of gestational diabetes 10/07/2011  . 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 [place] [address] Phone: [phone] Fax: [phone]  Readmission Risk Interventions Readmission Risk Prevention Plan 11/08/2020 07/02/2019  Transportation Screening Complete Complete  Medication Review (RN Care Manager) - Complete  PCP or Specialist appointment within 3-5 days of discharge - Complete  HRI or [place] - Patient refused  SW Recovery Care/Counseling Consult Complete  Patient refused  Palliative Care Screening Not Applicable Not [place] Not Applicable Not Applicable

## 2020-11-09 DIAGNOSIS — D5700 Hb-SS disease with crisis, unspecified: Secondary | ICD-10-CM | POA: Diagnosis not present

## 2020-11-10 ENCOUNTER — Telehealth: Payer: Self-pay

## 2020-11-10 DIAGNOSIS — D5700 Hb-SS disease with crisis, unspecified: Secondary | ICD-10-CM | POA: Diagnosis not present

## 2020-11-10 DIAGNOSIS — F419 Anxiety disorder, unspecified: Secondary | ICD-10-CM

## 2020-11-10 DIAGNOSIS — F32A Depression, unspecified: Secondary | ICD-10-CM

## 2020-11-10 DIAGNOSIS — I2699 Other pulmonary embolism without acute cor pulmonale: Secondary | ICD-10-CM

## 2020-11-10 DIAGNOSIS — K219 Gastro-esophageal reflux disease without esophagitis: Secondary | ICD-10-CM

## 2020-11-10 DIAGNOSIS — D473 Essential (hemorrhagic) thrombocythemia: Secondary | ICD-10-CM | POA: Diagnosis not present

## 2020-11-10 LAB — CBC WITH DIFFERENTIAL/PLATELET
Abs Immature Granulocytes: 0.25 10*3/uL — ABNORMAL HIGH (ref 0.00–0.07)
Basophils Absolute: 0.1 10*3/uL (ref 0.0–0.1)
Basophils Relative: 0 %
Eosinophils Absolute: 0.8 10*3/uL — ABNORMAL HIGH (ref 0.0–0.5)
Eosinophils Relative: 3 %
HCT: 17.4 % — ABNORMAL LOW (ref 36.0–46.0)
Hemoglobin: 6 g/dL — CL (ref 12.0–15.0)
Immature Granulocytes: 1 %
Lymphocytes Relative: 15 %
Lymphs Abs: 3.9 10*3/uL (ref 0.7–4.0)
MCH: 30.3 pg (ref 26.0–34.0)
MCHC: 34.5 g/dL (ref 30.0–36.0)
MCV: 87.9 fL (ref 80.0–100.0)
Monocytes Absolute: 2.6 10*3/uL — ABNORMAL HIGH (ref 0.1–1.0)
Monocytes Relative: 10 %
Neutro Abs: 17.9 10*3/uL — ABNORMAL HIGH (ref 1.7–7.7)
Neutrophils Relative %: 71 %
Platelets: 482 10*3/uL — ABNORMAL HIGH (ref 150–400)
RBC: 1.98 MIL/uL — ABNORMAL LOW (ref 3.87–5.11)
RDW: 24.9 % — ABNORMAL HIGH (ref 11.5–15.5)
WBC: 25.6 10*3/uL — ABNORMAL HIGH (ref 4.0–10.5)
nRBC: 0.3 % — ABNORMAL HIGH (ref 0.0–0.2)

## 2020-11-10 LAB — COMPREHENSIVE METABOLIC PANEL WITH GFR
ALT: 9 U/L (ref 0–44)
AST: 19 U/L (ref 15–41)
Albumin: 3.7 g/dL (ref 3.5–5.0)
Alkaline Phosphatase: 44 U/L (ref 38–126)
Anion gap: 3 — ABNORMAL LOW (ref 5–15)
BUN: 11 mg/dL (ref 6–20)
CO2: 23 mmol/L (ref 22–32)
Calcium: 8.5 mg/dL — ABNORMAL LOW (ref 8.9–10.3)
Chloride: 110 mmol/L (ref 98–111)
Creatinine, Ser: 0.72 mg/dL (ref 0.44–1.00)
GFR, Estimated: >60 mL/min
Glucose, Bld: 105 mg/dL — ABNORMAL HIGH (ref 70–99)
Potassium: 3.8 mmol/L (ref 3.5–5.1)
Sodium: 136 mmol/L (ref 135–145)
Total Bilirubin: 1.5 mg/dL — ABNORMAL HIGH (ref 0.3–1.2)
Total Protein: 7 g/dL (ref 6.5–8.1)

## 2020-11-10 LAB — PREPARE RBC (CROSSMATCH)

## 2020-11-10 MED ORDER — SODIUM CHLORIDE 0.9% IV SOLUTION: Freq: Once | INTRAVENOUS | Status: DC

## 2020-11-10 NOTE — Progress Notes (Signed)
Patient ID: Molly Gutierrez, female   DOB: 08/21/1980, 40 y.o.   MRN: [id number] Subjective: Molly Gutierrez is a 40 year old female with a medical history significant for sickle cell disease, chronic pain syndrome, opiate dependence and tolerance, history of PE on Xarelto, history of mild intermittent asthma, and history of anemia of chronic disease that was admitted for sickle cell pain crisis.  Patient has no new complaints today. Pain intensity is at 6/10.  Her hemoglobin has dropped to 6.0 which is below her baseline today.  She denies any headache, fever, fatigue, dizziness, shortness of breath, nausea, vomiting or diarrhea.  She has no urinary symptoms.  Objective:  Vital signs in last 24 hours:  Vitals:   11/10/20 1015 11/10/20 1308 11/10/20 1317 11/10/20 1411  BP: 125/81  110/74 111/66  Pulse: (!) 108  100 (!) 105  Resp:  16 17   Temp: 99.5 F (37.5 C)  99.2 F (37.3 C) 98.4 F (36.9 C)  TempSrc: Oral  Oral Oral  SpO2: 96% 96% 95% 96%  Weight:      Height:        Intake/Output from previous day:   Intake/Output Summary (Last 24 hours) at 11/10/2020 1523 Last data filed at 11/10/2020 6962 Gross per 24 hour  Intake 3410.75 ml  Output 1950 ml  Net 1460.75 ml    Physical Exam: General: Alert, awake, oriented x3, in no acute distress.  HEENT: [place]/AT PEERL, EOMI Neck: Trachea midline,  no masses, no thyromegal,y no JVD, no carotid bruit OROPHARYNX:  Moist, No exudate/ erythema/lesions.  Heart: Regular rate and rhythm, without murmurs, rubs, gallops, PMI non-displaced, no heaves or thrills on palpation.  Lungs: Clear to auscultation, no wheezing or rhonchi noted. No increased vocal fremitus resonant to percussion  Abdomen: Soft, nontender, nondistended, positive bowel sounds, no masses no hepatosplenomegaly noted..  Neuro: No focal neurological deficits noted cranial nerves II through XII grossly intact. DTRs 2+ bilaterally upper and lower extremities. Strength 5  out of 5 in bilateral upper and lower extremities. Musculoskeletal: No warm swelling or erythema around joints, no spinal tenderness noted. Psychiatric: Patient alert and oriented x3, good insight and cognition, good recent to remote recall. Lymph node survey: No cervical axillary or inguinal lymphadenopathy noted.  Lab Results:  Basic Metabolic Panel:    Component Value Date/Time   NA 136 11/10/2020 0852   NA 137 08/02/2020 1538   K 3.8 11/10/2020 0852   CL 110 11/10/2020 0852   CO2 23 11/10/2020 0852   BUN 11 11/10/2020 0852   BUN 7 08/02/2020 1538   CREATININE 0.72 11/10/2020 0852   GLUCOSE 105 (H) 11/10/2020 0852   CALCIUM 8.5 (L) 11/10/2020 0852   CBC:    Component Value Date/Time   WBC 25.6 (H) 11/10/2020 0852   HGB 6.0 (LL) 11/10/2020 0852   HGB 7.8 (L) 08/02/2020 1538   HCT 17.4 (L) 11/10/2020 0852   HCT 22.3 (L) 08/02/2020 1538   PLT 482 (H) 11/10/2020 0852   PLT 560 (H) 08/02/2020 1538   MCV 87.9 11/10/2020 0852   MCV 92 08/02/2020 1538   NEUTROABS 17.9 (H) 11/10/2020 0852   NEUTROABS 9.5 (H) 08/02/2020 1538   LYMPHSABS 3.9 11/10/2020 0852   LYMPHSABS 3.0 08/02/2020 1538   MONOABS 2.6 (H) 11/10/2020 0852   EOSABS 0.8 (H) 11/10/2020 0852   EOSABS 0.4 08/02/2020 1538   BASOSABS 0.1 11/10/2020 0852   BASOSABS 0.1 08/02/2020 1538    Recent Results (from the past 240 hour(s))  Resp Panel  by RT-PCR (Flu A&B, Covid) Nasopharyngeal Swab     Status: None   Collection Time: 11/07/20  9:01 PM   Specimen: Nasopharyngeal Swab; Nasopharyngeal(NP) swabs in vial transport medium  Result Value Ref Range Status   SARS Coronavirus 2 by RT PCR NEGATIVE NEGATIVE Final    Comment: (NOTE) SARS-CoV-2 target nucleic acids are NOT DETECTED.  The SARS-CoV-2 RNA is generally detectable in upper respiratory specimens during the acute phase of infection. The lowest concentration of SARS-CoV-2 viral copies this assay can detect is 138 copies/mL. A negative result does not preclude  SARS-Cov-2 infection and should not be used as the sole basis for treatment or other patient management decisions. A negative result may occur with  improper specimen collection/handling, submission of specimen other than nasopharyngeal swab, presence of viral mutation(s) within the areas targeted by this assay, and inadequate number of viral copies(<138 copies/mL). A negative result must be combined with clinical observations, patient history, and epidemiological information. The expected result is Negative.  Fact Sheet for Patients:  EntrepreneurPulse.com.au  Fact Sheet for Healthcare Providers:  IncredibleEmployment.be  This test is no t yet approved or cleared by the [place] FDA and  has been authorized for detection and/or diagnosis of SARS-CoV-2 by FDA under an Emergency Use Authorization (EUA). This EUA will remain  in effect (meaning this test can be used) for the duration of the COVID-19 declaration under Section 564(b)(1) of the Act, 21 U.S.C.section 360bbb-3(b)(1), unless the authorization is terminated  or revoked sooner.       Influenza A by PCR NEGATIVE NEGATIVE Final   Influenza B by PCR NEGATIVE NEGATIVE Final    Comment: (NOTE) The Xpert Xpress SARS-CoV-2/FLU/RSV plus assay is intended as an aid in the diagnosis of influenza from Nasopharyngeal swab specimens and should not be used as a sole basis for treatment. Nasal washings and aspirates are unacceptable for Xpert Xpress SARS-CoV-2/FLU/RSV testing.  Fact Sheet for Patients: EntrepreneurPulse.com.au  Fact Sheet for Healthcare Providers: IncredibleEmployment.be  This test is not yet approved or cleared by the [place] FDA and has been authorized for detection and/or diagnosis of SARS-CoV-2 by FDA under an Emergency Use Authorization (EUA). This EUA will remain in effect (meaning this test can be used) for the duration of  the COVID-19 declaration under Section 564(b)(1) of the Act, 21 U.S.C. section 360bbb-3(b)(1), unless the authorization is terminated or revoked.  Performed at [facility], [place] [address] [place], [place] [address]     Studies/Results: No results found.  Medications: Scheduled Meds: . sodium chloride   Intravenous Once  . Chlorhexidine Gluconate Cloth  6 each Topical Daily  . cholecalciferol  1,000 Units Oral Daily  . Deferasirox  1,080 mg Oral QAC breakfast  . folic acid  1 mg Oral Daily  . HYDROmorphone   Intravenous Q4H  . ketorolac  30 mg Intravenous Q6H  . mirtazapine  45 mg Oral QHS  . mometasone-formoterol  2 puff Inhalation BID  . pantoprazole  40 mg Oral Daily  . rivaroxaban  20 mg Oral QPC supper  . senna-docusate  1 tablet Oral BID  . vitamin B-12  1,000 mcg Oral Daily  . voxelotor  1,500 mg Oral Daily   Continuous Infusions: . sodium chloride    . dextrose 5 % and 0.45% NaCl 125 mL/hr at 11/10/20 0839  . diphenhydrAMINE 25 mg (11/10/20 0958)  . promethazine (PHENERGAN) injection (IM or IVPB) 25 mg (11/10/20 0915)   PRN Meds:.albuterol, diphenhydrAMINE **OR** diphenhydrAMINE, gabapentin,  HYDROmorphone, naloxone **AND** sodium chloride flush, ondansetron (ZOFRAN) IV, polyethylene glycol, promethazine (PHENERGAN) injection (IM or IVPB), promethazine, senna-docusate, sodium chloride flush  Consultants:  None  Procedures:  None  Antibiotics:  None  Assessment/Plan: Principal Problem:   Sickle cell disease with crisis ([place]) Active Problems:   Essential (hemorrhagic) thrombocythemia ([place])   Hx of pulmonary embolism ([facility])   Anxiety and depression   Gastro-esophageal reflux disease without esophagitis   Anemia  1. Hb Sickle Cell Disease with Pain crisis: Continue IVF at [facility], continue weight based Dilaudid PCA at current setting, continue IV Toradol 15 mg Q 6 H for total of 5 days, continue oral home pain medications as ordered.   Monitor vitals very closely, Re-evaluate pain scale regularly, 2 L of Oxygen by [place]. 2. Leukocytosis: Most likely reactive to sickle cell pain crisis, there is no evidence of infection of inflammation.  Patient has no fever.  We will continue to monitor very closely without antibiotics.  Repeat labs in AM. 3. Sickle Cell Anemia: Hemoglobin is at 6 which is below patient's baseline of between 7 and 8.  Will transfuse patient with 1 unit of packed red blood cells today. Repeat labs in AM. 4. Chronic pain Syndrome: Continue oral home pain medications. 5. History of PE: Continue Xarelto. 6. Asymptomatic intermittent asthma: Stable. Continue home medications.  Code Status: Full Code Family Communication: N/A Disposition Plan: Not yet ready for discharge  Ezrah Panning  If 7PM-7AM, please contact night-coverage.  11/10/2020, 3:23 PM  LOS: 3 days

## 2020-11-10 NOTE — Progress Notes (Signed)
Subjective: 40 year old female with history of sickle cell disease that was admitted yesterday with sickle cell painful crisis.  Patient still having pain at 7 out of 10.  Mainly in the back and legs.  Denied any fever or chills.  Currently on Dilaudid PCA and Toradol.  Also Dilaudid injections 2 mg every 2 hours as needed.  No nausea vomiting or diarrhea.  Objective: Vital signs in last 24 hours: Temp:  [98.9 F (37.2 C)-99.1 F (37.3 C)] 99 F (37.2 C) (05/26 0205) Pulse Rate:  [93-109] 109 (05/26 0205) Resp:  [13-18] 14 (05/27 0342) BP: (102-122)/(66-86) 119/83 (05/26 0205) SpO2:  [93 %-97 %] 95 % (05/26 0342) FiO2 (%):  [21 %] 21 % (05/26 2044) Weight change:  Last BM Date: 11/07/20  Intake/Output from previous day: 05/26 0701 - 05/26 0700 In: 4215 [P.O.:360; I.V.:3555; IV Piggyback:300] Out: 1300 [Urine:1300] Intake/Output this shift: Total I/O In: 2201 [P.O.:360; I.V.:1691; IV Piggyback:150] Out: 1300 [Urine:1300]  General appearance: alert, cooperative, appears stated age and no distress Neck: no adenopathy, no carotid bruit, no JVD, supple, symmetrical, trachea midline and thyroid not enlarged, symmetric, no tenderness/mass/nodules Back: symmetric, no curvature. ROM normal. No CVA tenderness. Resp: clear to auscultation bilaterally Cardio: regular rate and rhythm, S1, S2 normal, no murmur, click, rub or gallop GI: soft, non-tender; bowel sounds normal; no masses,  no organomegaly Extremities: extremities normal, atraumatic, no cyanosis or edema Pulses: 2+ and symmetric Skin: Skin color, texture, turgor normal. No rashes or lesions Neurologic: Grossly normal  Lab Results: Recent Labs    11/07/20 2109 11/08/20 0100  WBC 14.0* 15.2*  HGB 7.0* 7.2*  HCT 20.4* 20.8*  PLT 499* 511*   BMET Recent Labs    11/07/20 2109 11/08/20 0100  NA 137 137  K 3.9 3.7  CL 107 106  CO2 21* 23  GLUCOSE 96 108*  BUN 10 11  CREATININE 0.57 0.70  CALCIUM 8.9 9.1     Studies/Results: No results found.  Medications: I have reviewed the patient's current medications.  Assessment/Plan: 40 year old female with sickle cell disease admitted with sickle cell painful crisis.  #1 sickle cell painful crisis: Patient will be maintained on the Dilaudid PCA and Toradol.  She is on oral Dilaudid also for transitioning to home dose.  Continue close monitoring.  #2 anemia of chronic disease: Hemoglobin appears to be at baseline.  Continue monitoring.  #3 chronic pain syndrome: Continue home regimen.  #4 chronic anticoagulation: Continue Xarelto.  #5 asthma: Continue breathing treatments.   LOS: 2 days   Molly Gutierrez,LAWAL 11/09/2020, 05:41 PM

## 2020-11-10 NOTE — Telephone Encounter (Signed)
Promethizine Hydromorphone   [place] in [place]

## 2020-11-10 NOTE — Care Management Important Message (Signed)
Important Message  Patient Details IM Letter given to the Patient. Name: Molly Gutierrez MRN: [id number] Date of Birth: [birth year]   Medicare Important Message Given:  Yes     Kerin Salen 11/10/2020, 3:28 PM

## 2020-11-11 DIAGNOSIS — D473 Essential (hemorrhagic) thrombocythemia: Secondary | ICD-10-CM | POA: Diagnosis not present

## 2020-11-11 DIAGNOSIS — D5700 Hb-SS disease with crisis, unspecified: Secondary | ICD-10-CM | POA: Diagnosis not present

## 2020-11-11 DIAGNOSIS — I2699 Other pulmonary embolism without acute cor pulmonale: Secondary | ICD-10-CM | POA: Diagnosis not present

## 2020-11-11 DIAGNOSIS — F419 Anxiety disorder, unspecified: Secondary | ICD-10-CM | POA: Diagnosis not present

## 2020-11-11 LAB — CBC WITH DIFFERENTIAL/PLATELET
Abs Immature Granulocytes: 0.24 10*3/uL — ABNORMAL HIGH (ref 0.00–0.07)
Basophils Absolute: 0.1 10*3/uL (ref 0.0–0.1)
Basophils Relative: 0 %
Eosinophils Absolute: 0.7 10*3/uL — ABNORMAL HIGH (ref 0.0–0.5)
Eosinophils Relative: 3 %
HCT: 21.2 % — ABNORMAL LOW (ref 36.0–46.0)
Hemoglobin: 7.2 g/dL — ABNORMAL LOW (ref 12.0–15.0)
Immature Granulocytes: 1 %
Lymphocytes Relative: 16 %
Lymphs Abs: 3.3 10*3/uL (ref 0.7–4.0)
MCH: 30 pg (ref 26.0–34.0)
MCHC: 34 g/dL (ref 30.0–36.0)
MCV: 88.3 fL (ref 80.0–100.0)
Monocytes Absolute: 2.2 10*3/uL — ABNORMAL HIGH (ref 0.1–1.0)
Monocytes Relative: 10 %
Neutro Abs: 14.3 10*3/uL — ABNORMAL HIGH (ref 1.7–7.7)
Neutrophils Relative %: 70 %
Platelets: 489 10*3/uL — ABNORMAL HIGH (ref 150–400)
RBC: 2.4 MIL/uL — ABNORMAL LOW (ref 3.87–5.11)
RDW: 22.5 % — ABNORMAL HIGH (ref 11.5–15.5)
WBC: 20.8 10*3/uL — ABNORMAL HIGH (ref 4.0–10.5)
nRBC: 0.4 % — ABNORMAL HIGH (ref 0.0–0.2)

## 2020-11-11 NOTE — Plan of Care (Signed)
  Problem: Pain Managment: Goal: General experience of comfort will improve Outcome: Progressing   Problem: Coping: Goal: Level of anxiety will decrease Outcome: Progressing   

## 2020-11-11 NOTE — Progress Notes (Signed)
Patient ID: Molly Gutierrez, female   DOB: [birth year], 40 y.o.   MRN: [id number] Subjective: Molly Gutierrez is a 40 year old female with a medical history significant for sickle cell disease, chronic pain syndrome, opiate dependence and tolerance, history of PE on Xarelto, history of mild intermittent asthma, and history of anemia of chronic disease that was admitted for sickle cell pain crisis.  Patient is status post transfusion of 1 unit of packed red blood cells today, she says she feels slightly better than yesterday but still not at baseline completely.  Her pain is stopped at 6/10, mostly in her lower extremities.  There is no joint swelling or redness.  She denies any fever, cough, chest pain, shortness of breath, nausea, vomiting or diarrhea.  No urinary symptoms.  Patient is ambulating well.  Objective:  Vital signs in last 24 hours:  Vitals:   11/11/20 0845 11/11/20 0938 11/11/20 1219 11/11/20 1356  BP:  94/66  106/77  Pulse:  87  83  Resp:  15 14   Temp:  99 F (37.2 C)  99.2 F (37.3 C)  TempSrc:  Oral  Oral  SpO2: 98% 96% 95% 96%  Weight:      Height:        Intake/Output from previous day:   Intake/Output Summary (Last 24 hours) at 11/11/2020 1448 Last data filed at 11/11/2020 0600 Gross per 24 hour  Intake 3313.12 ml  Output 600 ml  Net 2713.12 ml    Physical Exam: General: Alert, awake, oriented x3, in no acute distress.  HEENT: [place]/AT PEERL, EOMI Neck: Trachea midline,  no masses, no thyromegal,y no JVD, no carotid bruit OROPHARYNX:  Moist, No exudate/ erythema/lesions.  Heart: Regular rate and rhythm, without murmurs, rubs, gallops, PMI non-displaced, no heaves or thrills on palpation.  Lungs: Clear to auscultation, no wheezing or rhonchi noted. No increased vocal fremitus resonant to percussion  Abdomen: Soft, nontender, nondistended, positive bowel sounds, no masses no hepatosplenomegaly noted..  Neuro: No focal neurological deficits noted  cranial nerves II through XII grossly intact. DTRs 2+ bilaterally upper and lower extremities. Strength 5 out of 5 in bilateral upper and lower extremities. Musculoskeletal: No warm swelling or erythema around joints, no spinal tenderness noted. Psychiatric: Patient alert and oriented x3, good insight and cognition, good recent to remote recall. Lymph node survey: No cervical axillary or inguinal lymphadenopathy noted.  Lab Results:  Basic Metabolic Panel:    Component Value Date/Time   NA 136 11/10/2020 0852   NA 137 08/02/2020 1538   K 3.8 11/10/2020 0852   CL 110 11/10/2020 0852   CO2 23 11/10/2020 0852   BUN 11 11/10/2020 0852   BUN 7 08/02/2020 1538   CREATININE 0.72 11/10/2020 0852   GLUCOSE 105 (H) 11/10/2020 0852   CALCIUM 8.5 (L) 11/10/2020 0852   CBC:    Component Value Date/Time   WBC 20.8 (H) 11/11/2020 0334   HGB 7.2 (L) 11/11/2020 0334   HGB 7.8 (L) 08/02/2020 1538   HCT 21.2 (L) 11/11/2020 0334   HCT 22.3 (L) 08/02/2020 1538   PLT 489 (H) 11/11/2020 0334   PLT 560 (H) 08/02/2020 1538   MCV 88.3 11/11/2020 0334   MCV 92 08/02/2020 1538   NEUTROABS 14.3 (H) 11/11/2020 0334   NEUTROABS 9.5 (H) 08/02/2020 1538   LYMPHSABS 3.3 11/11/2020 0334   LYMPHSABS 3.0 08/02/2020 1538   MONOABS 2.2 (H) 11/11/2020 0334   EOSABS 0.7 (H) 11/11/2020 0334   EOSABS 0.4 08/02/2020 1538   BASOSABS  0.1 11/11/2020 0334   BASOSABS 0.1 08/02/2020 1538    Recent Results (from the past 240 hour(s))  Resp Panel by RT-PCR (Flu A&B, Covid) Nasopharyngeal Swab     Status: None   Collection Time: 11/07/20  9:01 PM   Specimen: Nasopharyngeal Swab; Nasopharyngeal(NP) swabs in vial transport medium  Result Value Ref Range Status   SARS Coronavirus 2 by RT PCR NEGATIVE NEGATIVE Final    Comment: (NOTE) SARS-CoV-2 target nucleic acids are NOT DETECTED.  The SARS-CoV-2 RNA is generally detectable in upper respiratory specimens during the acute phase of infection. The lowest concentration  of SARS-CoV-2 viral copies this assay can detect is 138 copies/mL. A negative result does not preclude SARS-Cov-2 infection and should not be used as the sole basis for treatment or other patient management decisions. A negative result may occur with  improper specimen collection/handling, submission of specimen other than nasopharyngeal swab, presence of viral mutation(s) within the areas targeted by this assay, and inadequate number of viral copies(<138 copies/mL). A negative result must be combined with clinical observations, patient history, and epidemiological information. The expected result is Negative.  Fact Sheet for Patients:  EntrepreneurPulse.com.au  Fact Sheet for Healthcare Providers:  IncredibleEmployment.be  This test is no t yet approved or cleared by the [place] FDA and  has been authorized for detection and/or diagnosis of SARS-CoV-2 by FDA under an Emergency Use Authorization (EUA). This EUA will remain  in effect (meaning this test can be used) for the duration of the COVID-19 declaration under Section 564(b)(1) of the Act, 21 U.S.C.section 360bbb-3(b)(1), unless the authorization is terminated  or revoked sooner.       Influenza A by PCR NEGATIVE NEGATIVE Final   Influenza B by PCR NEGATIVE NEGATIVE Final    Comment: (NOTE) The Xpert Xpress SARS-CoV-2/FLU/RSV plus assay is intended as an aid in the diagnosis of influenza from Nasopharyngeal swab specimens and should not be used as a sole basis for treatment. Nasal washings and aspirates are unacceptable for Xpert Xpress SARS-CoV-2/FLU/RSV testing.  Fact Sheet for Patients: EntrepreneurPulse.com.au  Fact Sheet for Healthcare Providers: IncredibleEmployment.be  This test is not yet approved or cleared by the [place] FDA and has been authorized for detection and/or diagnosis of SARS-CoV-2 by FDA under an Emergency Use  Authorization (EUA). This EUA will remain in effect (meaning this test can be used) for the duration of the COVID-19 declaration under Section 564(b)(1) of the Act, 21 U.S.C. section 360bbb-3(b)(1), unless the authorization is terminated or revoked.  Performed at [facility], [place] [address], [place], [place] [address]     Studies/Results: No results found.  Medications: Scheduled Meds: . sodium chloride   Intravenous Once  . Chlorhexidine Gluconate Cloth  6 each Topical Daily  . cholecalciferol  1,000 Units Oral Daily  . Deferasirox  1,080 mg Oral QAC breakfast  . folic acid  1 mg Oral Daily  . HYDROmorphone   Intravenous Q4H  . ketorolac  30 mg Intravenous Q6H  . mirtazapine  45 mg Oral QHS  . mometasone-formoterol  2 puff Inhalation BID  . pantoprazole  40 mg Oral Daily  . rivaroxaban  20 mg Oral QPC supper  . senna-docusate  1 tablet Oral BID  . vitamin B-12  1,000 mcg Oral Daily  . voxelotor  1,500 mg Oral Daily   Continuous Infusions: . sodium chloride    . dextrose 5 % and 0.45% NaCl 125 mL/hr at 11/11/20 1200  . diphenhydrAMINE 25 mg (  11/11/20 1215)  . promethazine (PHENERGAN) injection (IM or IVPB) 25 mg (11/11/20 0037)   PRN Meds:.albuterol, diphenhydrAMINE **OR** diphenhydrAMINE, gabapentin, HYDROmorphone, naloxone **AND** sodium chloride flush, ondansetron (ZOFRAN) IV, polyethylene glycol, promethazine (PHENERGAN) injection (IM or IVPB), promethazine, senna-docusate, sodium chloride flush  Consultants:  None  Procedures:  None  Antibiotics:  None  Assessment/Plan: Principal Problem:   Sickle cell disease with crisis ([place]) Active Problems:   Essential (hemorrhagic) thrombocythemia ([place])   Hx of pulmonary embolism ([facility])   Anxiety and depression   Gastro-esophageal reflux disease without esophagitis   Anemia  1. Hb Sickle Cell Disease with Pain crisis: Improving.  Patient is hemodynamically stable. Continue [facility] at [facility], continue  weight based Dilaudid PCA at current setting, continue IV Toradol 15 mg Q 6 H for total of 5 days, continue oral home pain medications as ordered.  Monitor vitals very closely, Re-evaluate pain scale regularly, 2 L of Oxygen by [place]. 2. Leukocytosis: Improving.  Most likely reactive to sickle cell pain crisis, there is no evidence of infection of inflammation.  Patient has no fever.  We will continue to monitor very closely without antibiotics.  Repeat labs in AM. 3. Sickle Cell Anemia: Patient is status post transfusion of 1 unit of packed red blood cell yesterday, hemoglobin has improved to 7.2 today which is consistent with her baseline. We will continue to monitor closely and repeat labs in AM. 4. Chronic pain Syndrome: Continue oral home pain medications. 5. History of PE: Continue Xarelto. 6. Asymptomatic intermittent asthma: Stable. Continue home medications.  Code Status: Full Code Family Communication: N/A Disposition Plan: Not yet ready for discharge  Molly Gutierrez  If 7PM-7AM, please contact night-coverage.  11/11/2020, 2:48 PM  LOS: 4 days

## 2020-11-12 DIAGNOSIS — D5700 Hb-SS disease with crisis, unspecified: Secondary | ICD-10-CM | POA: Diagnosis not present

## 2020-11-12 DIAGNOSIS — I2699 Other pulmonary embolism without acute cor pulmonale: Secondary | ICD-10-CM | POA: Diagnosis not present

## 2020-11-12 DIAGNOSIS — F419 Anxiety disorder, unspecified: Secondary | ICD-10-CM | POA: Diagnosis not present

## 2020-11-12 DIAGNOSIS — D473 Essential (hemorrhagic) thrombocythemia: Secondary | ICD-10-CM | POA: Diagnosis not present

## 2020-11-12 LAB — CBC WITH DIFFERENTIAL/PLATELET
Abs Immature Granulocytes: 0.13 10*3/uL — ABNORMAL HIGH (ref 0.00–0.07)
Basophils Absolute: 0.1 10*3/uL (ref 0.0–0.1)
Basophils Relative: 0 %
Eosinophils Absolute: 0.6 10*3/uL — ABNORMAL HIGH (ref 0.0–0.5)
Eosinophils Relative: 3 %
HCT: 21 % — ABNORMAL LOW (ref 36.0–46.0)
Hemoglobin: 7 g/dL — ABNORMAL LOW (ref 12.0–15.0)
Immature Granulocytes: 1 %
Lymphocytes Relative: 16 %
Lymphs Abs: 2.7 10*3/uL (ref 0.7–4.0)
MCH: 30 pg (ref 26.0–34.0)
MCHC: 33.3 g/dL (ref 30.0–36.0)
MCV: 90.1 fL (ref 80.0–100.0)
Monocytes Absolute: 1.9 10*3/uL — ABNORMAL HIGH (ref 0.1–1.0)
Monocytes Relative: 11 %
Neutro Abs: 11.8 10*3/uL — ABNORMAL HIGH (ref 1.7–7.7)
Neutrophils Relative %: 69 %
Platelets: 487 10*3/uL — ABNORMAL HIGH (ref 150–400)
RBC: 2.33 MIL/uL — ABNORMAL LOW (ref 3.87–5.11)
RDW: 23.3 % — ABNORMAL HIGH (ref 11.5–15.5)
WBC: 17.2 10*3/uL — ABNORMAL HIGH (ref 4.0–10.5)
nRBC: 0.4 % — ABNORMAL HIGH (ref 0.0–0.2)

## 2020-11-12 LAB — BASIC METABOLIC PANEL
Anion gap: 4 — ABNORMAL LOW (ref 5–15)
BUN: 13 mg/dL (ref 6–20)
CO2: 23 mmol/L (ref 22–32)
Calcium: 8.3 mg/dL — ABNORMAL LOW (ref 8.9–10.3)
Chloride: 111 mmol/L (ref 98–111)
Creatinine, Ser: 0.47 mg/dL (ref 0.44–1.00)
GFR, Estimated: >60 mL/min (ref >60)
Glucose, Bld: 91 mg/dL (ref 70–99)
Potassium: 3.8 mmol/L (ref 3.5–5.1)
Sodium: 138 mmol/L (ref 135–145)

## 2020-11-12 NOTE — Progress Notes (Signed)
Patient ID: Molly Gutierrez, female   DOB: May 04, 1981, 40 y.o.   MRN: [id number] Subjective: Molly Gutierrez is a 40 year old female with a medical history significant for sickle cell disease, chronic pain syndrome, opiate dependence and tolerance, history of PE on Xarelto, history of mild intermittent asthma, and history of anemia of chronic disease that was admitted for sickle cell pain crisis.  Patient has no new complaint today.  She said her pain is still at about 6/10, would like to stay 1 more night to get the pain completely down to her baseline.  She has no fever.  She denies any chest pain, cough, shortness of breath, dizziness, nausea, vomiting or diarrhea.  Objective:  Vital signs in last 24 hours:  Vitals:   11/12/20 0826 11/12/20 0938 11/12/20 1107 11/12/20 1326  BP:  120/79  100/68  Pulse:  81  78  Resp: 12 18 12 18   Temp:  98.7 F (37.1 C)  98.8 F (37.1 C)  TempSrc:  Oral  Oral  SpO2: 94% 96% 96% 96%  Weight:      Height:        Intake/Output from previous day:   Intake/Output Summary (Last 24 hours) at 11/12/2020 1334 Last data filed at 11/12/2020 0900 Gross per 24 hour  Intake 1707.34 ml  Output 1100 ml  Net 607.34 ml    Physical Exam: General: Alert, awake, oriented x3, in no acute distress.  HEENT: [place]/AT PEERL, EOMI Neck: Trachea midline,  no masses, no thyromegal,y no JVD, no carotid bruit OROPHARYNX:  Moist, No exudate/ erythema/lesions.  Heart: Regular rate and rhythm, without murmurs, rubs, gallops, PMI non-displaced, no heaves or thrills on palpation.  Lungs: Clear to auscultation, no wheezing or rhonchi noted. No increased vocal fremitus resonant to percussion  Abdomen: Soft, nontender, nondistended, positive bowel sounds, no masses no hepatosplenomegaly noted..  Neuro: No focal neurological deficits noted cranial nerves II through XII grossly intact. DTRs 2+ bilaterally upper and lower extremities. Strength 5 out of 5 in bilateral upper  and lower extremities. Musculoskeletal: No warm swelling or erythema around joints, no spinal tenderness noted. Psychiatric: Patient alert and oriented x3, good insight and cognition, good recent to remote recall. Lymph node survey: No cervical axillary or inguinal lymphadenopathy noted.  Lab Results:  Basic Metabolic Panel:    Component Value Date/Time   NA 138 11/12/2020 0320   NA 137 08/02/2020 1538   K 3.8 11/12/2020 0320   CL 111 11/12/2020 0320   CO2 23 11/12/2020 0320   BUN 13 11/12/2020 0320   BUN 7 08/02/2020 1538   CREATININE 0.47 11/12/2020 0320   GLUCOSE 91 11/12/2020 0320   CALCIUM 8.3 (L) 11/12/2020 0320   CBC:    Component Value Date/Time   WBC 17.2 (H) 11/12/2020 0320   HGB 7.0 (L) 11/12/2020 0320   HGB 7.8 (L) 08/02/2020 1538   HCT 21.0 (L) 11/12/2020 0320   HCT 22.3 (L) 08/02/2020 1538   PLT 487 (H) 11/12/2020 0320   PLT 560 (H) 08/02/2020 1538   MCV 90.1 11/12/2020 0320   MCV 92 08/02/2020 1538   NEUTROABS 11.8 (H) 11/12/2020 0320   NEUTROABS 9.5 (H) 08/02/2020 1538   LYMPHSABS 2.7 11/12/2020 0320   LYMPHSABS 3.0 08/02/2020 1538   MONOABS 1.9 (H) 11/12/2020 0320   EOSABS 0.6 (H) 11/12/2020 0320   EOSABS 0.4 08/02/2020 1538   BASOSABS 0.1 11/12/2020 0320   BASOSABS 0.1 08/02/2020 1538    Recent Results (from the past 240 hour(s))  Resp  Panel by RT-PCR (Flu A&B, Covid) Nasopharyngeal Swab     Status: None   Collection Time: 11/07/20  9:01 PM   Specimen: Nasopharyngeal Swab; Nasopharyngeal(NP) swabs in vial transport medium  Result Value Ref Range Status   SARS Coronavirus 2 by RT PCR NEGATIVE NEGATIVE Final    Comment: (NOTE) SARS-CoV-2 target nucleic acids are NOT DETECTED.  The SARS-CoV-2 RNA is generally detectable in upper respiratory specimens during the acute phase of infection. The lowest concentration of SARS-CoV-2 viral copies this assay can detect is 138 copies/mL. A negative result does not preclude SARS-Cov-2 infection and should  not be used as the sole basis for treatment or other patient management decisions. A negative result may occur with  improper specimen collection/handling, submission of specimen other than nasopharyngeal swab, presence of viral mutation(s) within the areas targeted by this assay, and inadequate number of viral copies(<138 copies/mL). A negative result must be combined with clinical observations, patient history, and epidemiological information. The expected result is Negative.  Fact Sheet for Patients:  EntrepreneurPulse.com.au  Fact Sheet for Healthcare Providers:  IncredibleEmployment.be  This test is no t yet approved or cleared by the [place] FDA and  has been authorized for detection and/or diagnosis of SARS-CoV-2 by FDA under an Emergency Use Authorization (EUA). This EUA will remain  in effect (meaning this test can be used) for the duration of the COVID-19 declaration under Section 564(b)(1) of the Act, 21 U.S.C.section 360bbb-3(b)(1), unless the authorization is terminated  or revoked sooner.       Influenza A by PCR NEGATIVE NEGATIVE Final   Influenza B by PCR NEGATIVE NEGATIVE Final    Comment: (NOTE) The Xpert Xpress SARS-CoV-2/FLU/RSV plus assay is intended as an aid in the diagnosis of influenza from Nasopharyngeal swab specimens and should not be used as a sole basis for treatment. Nasal washings and aspirates are unacceptable for Xpert Xpress SARS-CoV-2/FLU/RSV testing.  Fact Sheet for Patients: EntrepreneurPulse.com.au  Fact Sheet for Healthcare Providers: IncredibleEmployment.be  This test is not yet approved or cleared by the [place] FDA and has been authorized for detection and/or diagnosis of SARS-CoV-2 by FDA under an Emergency Use Authorization (EUA). This EUA will remain in effect (meaning this test can be used) for the duration of the COVID-19 declaration under  Section 564(b)(1) of the Act, 21 U.S.C. section 360bbb-3(b)(1), unless the authorization is terminated or revoked.  Performed at [facility], [place] [address] [place], [place] [address]     Studies/Results: No results found.  Medications: Scheduled Meds: . Chlorhexidine Gluconate Cloth  6 each Topical Daily  . cholecalciferol  1,000 Units Oral Daily  . Deferasirox  1,080 mg Oral QAC breakfast  . folic acid  1 mg Oral Daily  . HYDROmorphone   Intravenous Q4H  . ketorolac  30 mg Intravenous Q6H  . mirtazapine  45 mg Oral QHS  . mometasone-formoterol  2 puff Inhalation BID  . pantoprazole  40 mg Oral Daily  . rivaroxaban  20 mg Oral QPC supper  . senna-docusate  1 tablet Oral BID  . vitamin B-12  1,000 mcg Oral Daily  . voxelotor  1,500 mg Oral Daily   Continuous Infusions: . dextrose 5 % and 0.45% NaCl 125 mL/hr at 11/11/20 1200  . diphenhydrAMINE 25 mg (11/12/20 1111)  . promethazine (PHENERGAN) injection (IM or IVPB) 25 mg (11/11/20 2326)   PRN Meds:.albuterol, diphenhydrAMINE **OR** diphenhydrAMINE, gabapentin, HYDROmorphone, naloxone **AND** sodium chloride flush, ondansetron (ZOFRAN) IV, polyethylene glycol, promethazine (PHENERGAN)  injection (IM or IVPB), promethazine, senna-docusate, sodium chloride flush  Consultants:  None  Procedures:  None  Antibiotics:  None  Assessment/Plan: Principal Problem:   Sickle cell disease with crisis ([place]) Active Problems:   Essential (hemorrhagic) thrombocythemia ([facility])   Hx of pulmonary embolism ([facility])   Anxiety and depression   Gastro-esophageal reflux disease without esophagitis   Anemia  1. Hb Sickle Cell Disease with Pain crisis: Improving.  Patient is hemodynamically stable. Continue IVF at [facility] begin to wean weight based Dilaudid PCA in anticipation of discharge home tomorrow morning, continue IV Toradol 15 mg Q 6 H for total of 5 days, continue oral home pain medications as ordered.  Monitor  vitals very closely, Re-evaluate pain scale regularly, 2 L of Oxygen by [place]. 2. Leukocytosis: Improving.  Most likely reactive to sickle cell pain crisis, there is no evidence of infection of inflammation.  Patient has no fever.  We will continue to monitor very closely without antibiotics.  Repeat labs in AM. 3. Sickle Cell Anemia: Patient is status post transfusion of 1 unit of packed red blood cell yesterday, hemoglobin is 7.0 today which is consistent with her baseline. We will continue to monitor closely and repeat labs in AM. 4. Chronic pain Syndrome: Continue oral home pain medications. 5. History of PE: Continue Xarelto. 6. Asymptomatic intermittent asthma: Stable. Continue home medications.  Code Status: Full Code Family Communication: N/A Disposition Plan: Not yet ready for discharge  Molly Gutierrez  If 7PM-7AM, please contact night-coverage.  11/12/2020, 1:34 PM  LOS: 5 days

## 2020-11-13 ENCOUNTER — Other Ambulatory Visit: Payer: Self-pay | Admitting: Internal Medicine

## 2020-11-13 DIAGNOSIS — D473 Essential (hemorrhagic) thrombocythemia: Secondary | ICD-10-CM | POA: Diagnosis not present

## 2020-11-13 DIAGNOSIS — F419 Anxiety disorder, unspecified: Secondary | ICD-10-CM | POA: Diagnosis not present

## 2020-11-13 DIAGNOSIS — I2699 Other pulmonary embolism without acute cor pulmonale: Secondary | ICD-10-CM | POA: Diagnosis not present

## 2020-11-13 DIAGNOSIS — D5700 Hb-SS disease with crisis, unspecified: Secondary | ICD-10-CM | POA: Diagnosis not present

## 2020-11-13 MED ORDER — HYDROMORPHONE HCL 4 MG PO TABS: 4.0000 mg | ORAL_TABLET | Freq: Four times a day (QID) | ORAL | 0 refills | Status: DC | PRN

## 2020-11-13 MED ORDER — HEPARIN SOD (PORK) LOCK FLUSH 100 UNIT/ML IV SOLN
500.0000 [IU] | INTRAVENOUS | Status: AC | PRN
Start: 2020-11-13 — End: 2020-11-13
  Administered 2020-11-13: 500 [IU]
  Filled 2020-11-13: qty 5

## 2020-11-13 NOTE — Care Management Important Message (Signed)
Medicare IM given to the patient by Malyiah Fellows. 

## 2020-11-13 NOTE — Discharge Summary (Signed)
Physician Discharge Summary  [facility] ZDG:[id number] DOB: [birth year] DOA: 11/07/2020  PCP: Vevelyn Francois, NP  Admit date: 11/07/2020  Discharge date: 11/13/2020  Discharge Diagnoses:  Principal Problem:   Sickle cell disease with crisis [facility]) Active Problems:   Essential (hemorrhagic) thrombocythemia ([place])   Hx of pulmonary embolism ([place])   Anxiety and depression   Gastro-esophageal reflux disease without esophagitis   Anemia   Discharge Condition: Stable  Disposition:   Follow-up Information    Vevelyn Francois, NP. Schedule an appointment as soon as possible for a visit in 1 week(s).   Specialty: Adult Health Nurse Practitioner Contact information: [address] [place] [address] [phone]              Pt is discharged home in good condition and is to follow up with Vevelyn Francois, NP this week to have labs evaluated. Molly Gutierrez is instructed to increase activity slowly and balance with rest for the next few days, and use prescribed medication to complete treatment of pain  Diet: Regular Wt Readings from Last 3 Encounters:  11/07/20 57.6 kg  10/09/20 61 kg  09/21/20 57 kg   History of present illness:  Molly Gutierrez is a 40 y.o. female with medical history significant of pulmonary embolism, sickle cell disease presenting to the ER with chest wall pain and leg pain that started about 2 days ago.  Patient has taken home regiment but no relief.  Pain is rated as 9 out of 10.  Going down her legs.  Denied any fever or chills denied any nausea vomiting or diarrhea.  Patient was seen in the ER was treated with up to 5 mg of IV Dilaudid with no relief.  She is therefore being admitted to the hospital for further evaluation and treatment.  Denied any nausea vomiting.  Denies any fever..  ED Course: Temperature 99.1 blood pressure 85/53 pulse 102 respirate of 20 and oxygen sats 93% room air.  White count is 14.0, hemoglobin 7.0 and  platelets 499.  Chemistry largely within normal.  Patient being admitted with sickle cell painful crisis.  Hospital Course:  Patient was admitted for sickle cell pain crisis and managed appropriately with IVF, IV Dilaudid via PCA and IV Toradol, as well as other adjunct therapies per sickle cell pain management protocols.  Patient slowly responded to above regimen as pain gradually returned to baseline.  Hemoglobin dropped to a nadir of 6.0 for which patient received 1 unit of packed red blood cell with significant improvement in her hemoglobin to 7.2 and was 7.0 at the time of discharge.  She has chronically elevated white cell count, she did not require antibiotics during this admission.  Blood cultures were negative and no other source or sign of infection.  Patient has been hemodynamically stable throughout this admission.  As at today, patient is tolerating p.o. intake well with no restrictions, ambulating well with no significant pain.  She needs refill of her home pain medication and Phenergan tablets which was sent to her pharmacy upon discharge.  Patient has no contraindication to being discharged today. Patient was therefore discharged home today in a hemodynamically stable condition.   Molly Gutierrez will follow-up with PCP within 1 week of this discharge. Molly Gutierrez was counseled extensively about nonpharmacologic means of pain management, patient verbalized understanding and was appreciative of  the care received during this admission.   We discussed the need for good hydration, monitoring of hydration status, avoidance of heat, cold, stress,  and infection triggers. We discussed the need to be adherent with taking Hydrea and other home medications. Patient was reminded of the need to seek medical attention immediately if any symptom of bleeding, anemia, or infection occurs.  Discharge Exam: Vitals:   11/13/20 0925 11/13/20 1136  BP: 113/79   Pulse: 90   Resp: 18 11  Temp: 98.8 F (37.1 C)    SpO2: 97% 99%   Vitals:   11/13/20 0529 11/13/20 0831 11/13/20 0925 11/13/20 1136  BP: 110/74  113/79   Pulse: 93  90   Resp: 17 17 18 11   Temp: 98.9 F (37.2 C)  98.8 F (37.1 C)   TempSrc: Oral  Oral   SpO2: 96% 100% 97% 99%  Weight:      Height:        General appearance : Awake, alert, not in any distress. Speech Clear. Not toxic looking HEENT: Atraumatic and Normocephalic, pupils equally reactive to light and accomodation Neck: Supple, no JVD. No cervical lymphadenopathy.  Chest: Good air entry bilaterally, no added sounds  CVS: S1 S2 regular, no murmurs.  Abdomen: Bowel sounds present, Non tender and not distended with no gaurding, rigidity or rebound. Extremities: B/L Lower Ext shows no edema, both legs are warm to touch Neurology: Awake alert, and oriented X 3, CN II-XII intact, Non focal Skin: No Rash  Discharge Instructions  Discharge Instructions    Diet - low sodium heart healthy   Complete by: As directed    Increase activity slowly   Complete by: As directed      Allergies as of 11/13/2020      Reactions   Cefaclor Hives, Swelling   Hydroxyurea Palpitations, Other (See Comments)   Lowers "blood levels" and heart rate (causes HYPOtension); "it messes me up, it drops my levels and stuff"   Omeprazole Anaphylaxis, Other (See Comments)   Causes "sharp pains in the stomach"   Ketamine Palpitations, Other (See Comments)   "Pt states she has had previous reaction to ketamine. States she becomes flushed, heart races, dizzy, and feels like she is going to pass out."      Medication List    STOP taking these medications   ergocalciferol 1.25 MG (50000 UT) capsule Commonly known as: VITAMIN D2     TAKE these medications   albuterol 108 (90 Base) MCG/ACT inhaler Commonly known as: VENTOLIN HFA Inhale 2 puffs into the lungs 2 (two) times daily as needed for wheezing or shortness of breath.   budesonide-formoterol 80-4.5 MCG/ACT inhaler Commonly known as:  SYMBICORT Inhale 2 puffs into the lungs 2 (two) times daily.   celecoxib 200 MG capsule Commonly known as: CELEBREX Take 1 capsule (200 mg total) by mouth daily. What changed:   when to take this  reasons to take this   Deferasirox 360 MG Tabs Commonly known as: Jadenu Take 3 tablets (1,080 mg total) by mouth daily before breakfast.   diphenhydrAMINE 25 mg capsule Commonly known as: BENADRYL Take 25 mg by mouth 3 (three) times daily as needed for itching.   folic acid 1 MG tablet Commonly known as: FOLVITE Take 1 tablet (1 mg total) by mouth daily.   gabapentin 300 MG capsule Commonly known as: NEURONTIN Take 1 capsule (300 mg total) by mouth 3 (three) times daily. What changed:   when to take this  reasons to take this   HYDROmorphone 4 MG tablet Commonly known as: DILAUDID Take 1 tablet (4 mg total) by mouth every 6 (six)  hours as needed for up to 15 days for moderate pain or severe pain.   mirtazapine 45 MG tablet Commonly known as: REMERON Take 1 tablet (45 mg total) by mouth at bedtime.   mometasone-formoterol 100-5 MCG/ACT Aero Commonly known as: DULERA Inhale 2 puffs into the lungs daily as needed for wheezing or shortness of breath.   omeprazole 20 MG capsule Commonly known as: PRILOSEC Take 1 capsule (20 mg total) by mouth 2 (two) times daily before a meal. What changed:   when to take this  reasons to take this   Oxbryta 500 MG Tabs tablet Generic drug: voxelotor Take by mouth.   polyethylene glycol 17 g packet Commonly known as: MIRALAX / GLYCOLAX Take 17 g by mouth daily as needed for mild constipation (MIX AND DRINK).   promethazine 25 MG tablet Commonly known as: PHENERGAN Take 0.5-1 tablets (12.5-25 mg total) by mouth every 6 (six) hours as needed for nausea or vomiting.   senna-docusate 8.6-50 MG tablet Commonly known as: Senokot-S Take 1 tablet by mouth at bedtime as needed for mild constipation.   vitamin B-12 1000 MCG  tablet Commonly known as: CYANOCOBALAMIN Take 1 tablet (1,000 mcg total) by mouth daily.   Vitamin D3 25 MCG (1000 UT) Caps Take 1 capsule (1,000 Units total) by mouth daily.   Xarelto 20 MG Tabs tablet Generic drug: rivaroxaban Take 1 tablet (20 mg total) by mouth daily with supper. What changed: when to take this       The results of significant diagnostics from this hospitalization (including imaging, microbiology, ancillary and laboratory) are listed below for reference.    Significant Diagnostic Studies: DG Chest 2 View  Result Date: 11/07/2020 CLINICAL DATA:  Chest pain, LEFT leg pain for 2 weeks, sickle cell pain EXAM: CHEST - 2 VIEW COMPARISON:  09/21/2020 FINDINGS: BILATERAL jugular Port-A-Caths, RIGHT tip projecting over SVC, LEFT tip projecting over cavoatrial junction. Enlargement of cardiac silhouette with pulmonary vascular congestion. Mediastinal contours normal. Minimal chronic interstitial prominence unchanged. No acute infiltrate, pleural effusion, or pneumothorax. No acute osseous findings. IMPRESSION: Enlargement of cardiac silhouette with pulmonary vascular congestion. No acute abnormalities. Electronically Signed   By: Lavonia Dana M.D.   On: 11/07/2020 15:46    Microbiology: Recent Results (from the past 240 hour(s))  Resp Panel by RT-PCR (Flu A&B, Covid) Nasopharyngeal Swab     Status: None   Collection Time: 11/07/20  9:01 PM   Specimen: Nasopharyngeal Swab; Nasopharyngeal(NP) swabs in vial transport medium  Result Value Ref Range Status   SARS Coronavirus 2 by RT PCR NEGATIVE NEGATIVE Final    Comment: (NOTE) SARS-CoV-2 target nucleic acids are NOT DETECTED.  The SARS-CoV-2 RNA is generally detectable in upper respiratory specimens during the acute phase of infection. The lowest concentration of SARS-CoV-2 viral copies this assay can detect is 138 copies/mL. A negative result does not preclude SARS-Cov-2 infection and should not be used as the sole basis  for treatment or other patient management decisions. A negative result may occur with  improper specimen collection/handling, submission of specimen other than nasopharyngeal swab, presence of viral mutation(s) within the areas targeted by this assay, and inadequate number of viral copies(<138 copies/mL). A negative result must be combined with clinical observations, patient history, and epidemiological information. The expected result is Negative.  Fact Sheet for Patients:  EntrepreneurPulse.com.au  Fact Sheet for Healthcare Providers:  IncredibleEmployment.be  This test is no t yet approved or cleared by the [place] FDA and  has  been authorized for detection and/or diagnosis of SARS-CoV-2 by FDA under an Emergency Use Authorization (EUA). This EUA will remain  in effect (meaning this test can be used) for the duration of the COVID-19 declaration under Section 564(b)(1) of the Act, 21 U.S.C.section 360bbb-3(b)(1), unless the authorization is terminated  or revoked sooner.       Influenza A by PCR NEGATIVE NEGATIVE Final   Influenza B by PCR NEGATIVE NEGATIVE Final    Comment: (NOTE) The Xpert Xpress SARS-CoV-2/FLU/RSV plus assay is intended as an aid in the diagnosis of influenza from Nasopharyngeal swab specimens and should not be used as a sole basis for treatment. Nasal washings and aspirates are unacceptable for Xpert Xpress SARS-CoV-2/FLU/RSV testing.  Fact Sheet for Patients: EntrepreneurPulse.com.au  Fact Sheet for Healthcare Providers: IncredibleEmployment.be  This test is not yet approved or cleared by the [place] FDA and has been authorized for detection and/or diagnosis of SARS-CoV-2 by FDA under an Emergency Use Authorization (EUA). This EUA will remain in effect (meaning this test can be used) for the duration of the COVID-19 declaration under Section 564(b)(1) of the Act, 21  U.S.C. section 360bbb-3(b)(1), unless the authorization is terminated or revoked.  Performed at [facility], [place] [address], [place], [place] [address]      Labs: Basic Metabolic Panel: Recent Labs  Lab 11/07/20 1503 11/07/20 2109 11/08/20 0100 11/10/20 0852 11/12/20 0320  NA 137 137 137 136 138  K 3.7 3.9 3.7 3.8 3.8  CL 109 107 106 110 111  CO2 24 21* 23 23 23   GLUCOSE 123* 96 108* 105* 91  BUN 13 10 11 11 13   CREATININE 0.63 0.57 0.70 0.72 0.47  CALCIUM 9.0 8.9 9.1 8.5* 8.3*   Liver Function Tests: Recent Labs  Lab 11/07/20 1503 11/07/20 2109 11/08/20 0100 11/10/20 0852  AST 33 30 28 19   ALT 9 10 11 9   ALKPHOS 51 49 51 44  BILITOT 2.7* 2.6* 2.6* 1.5*  PROT 7.8 7.8 8.0 7.0  ALBUMIN 4.3 4.4 4.4 3.7   No results for input(s): LIPASE, AMYLASE in the last 168 hours. No results for input(s): AMMONIA in the last 168 hours. CBC: Recent Labs  Lab 11/07/20 2109 11/08/20 0100 11/10/20 0852 11/11/20 0334 11/12/20 0320  WBC 14.0* 15.2* 25.6* 20.8* 17.2*  NEUTROABS 7.9* 8.0* 17.9* 14.3* 11.8*  HGB 7.0* 7.2* 6.0* 7.2* 7.0*  HCT 20.4* 20.8* 17.4* 21.2* 21.0*  MCV 86.1 87.0 87.9 88.3 90.1  PLT 499* 511* 482* 489* 487*   Cardiac Enzymes: No results for input(s): CKTOTAL, CKMB, CKMBINDEX, TROPONINI in the last 168 hours. BNP: Invalid input(s): POCBNP CBG: No results for input(s): GLUCAP in the last 168 hours.  Time coordinating discharge: 50 minutes  Signed:  [facility] 11/13/2020, 12:47 PM

## 2020-11-13 NOTE — Progress Notes (Signed)
D/C instructions given to patient. Patient had no questions. NT or writer will wheel patient out once her Shanda Howells is de accessed  and her ride gets here at [employer].

## 2020-11-13 NOTE — Discharge Instructions (Signed)
Sickle Cell Anemia, Adult  Sickle cell anemia is a condition in which red blood cells have an abnormal "sickle" shape. Red blood cells carry oxygen through the body. Sickle-shaped red blood cells do not live as long as normal red blood cells. They also clump together and block blood from flowing through the blood vessels. This condition prevents the body from getting enough oxygen. Sickle cell anemia causes organ damage and pain. It also increases the risk of infection. What are the causes? This condition is caused by a gene that is passed from parent to child (inherited). Receiving two copies of the gene causes the disease. Receiving one copy causes the "trait," which means that symptoms are milder or not present. What increases the risk? This condition is more likely to develop if your ancestors were from [place], the [place], [place] or [place], the [place], [place], or the [place]. What are the signs or symptoms? Symptoms of this condition include:  Episodes of pain (crises), especially in the hands and feet, joints, back, chest, or abdomen. The pain can be triggered by: ? An illness, especially if there is dehydration. ? Doing an activity with great effort (overexertion). ? Exposure to extreme temperature changes. ? High altitude.  Fatigue.  Shortness of breath or difficulty breathing.  Dizziness.  Pale skin or yellowed skin (jaundice).  Frequent bacterial infections.  Pain and swelling in the hands and feet (hand-food syndrome).  Prolonged, painful erection of the penis (priapism).  Acute chest syndrome. Symptoms of this include: ? Chest pain. ? Fever. ? Cough. ? Fast breathing.  Stroke.  Decreased activity.  Loss of appetite.  Change in behavior.  Headaches.  Seizures.  Vision changes.  Skin ulcers.  Heart disease.  High blood pressure.  Gallstones.  Liver and kidney problems. How is this diagnosed? This condition is diagnosed with  blood tests that check for the gene that causes this condition. How is this treated? There is no cure for most cases of this condition. Treatment focuses on managing your symptoms and preventing complications of the disease. Your health care provider will work with you to identify the best treatment options for you based on an assessment of your condition. Treatment may include:  Medicines, including: ? Pain medicines. ? Antibiotic medicines for infection. ? Medicines to increase the production of a protein in red blood cells that helps carry oxygen in the body (hemoglobin).  Fluids to treat pain and swelling.  Oxygen to treat acute chest syndrome.  Blood transfusions to treat symptoms such as fatigue, stroke, and acute chest syndrome.  Massage and physical therapy for pain.  Regular tests to monitor your condition, such as blood tests, X-rays, CT scans, MRI scans, ultrasounds, and lung function tests. These should be done every 3-12 months, depending on your age.  Hematopoietic stem cell transplant. This is a procedure to replace abnormal stem cells with healthy stem cells from a donor's bone marrow. Stem cells are cells that can develop into blood cells, and bone marrow is the spongy tissue inside the bones. Follow these instructions at home: Medicines  Take over-the-counter and prescription medicines only as told by your health care provider.  If you were prescribed an antibiotic medicine, take it as told by your health care provider. Do not stop taking the antibiotic even if you start to feel better.  If you develop a fever, do not take medicines to reduce the fever right away. This could cover up another problem. Notify your health care provider. Managing  pain, stiffness, and swelling  Try these methods to help ease your pain: ? Using a heating pad. ? Taking a warm bath. ? Distracting yourself, such as by watching TV. Eating and drinking  Drink enough fluid to keep your urine  clear or pale yellow. Drink more in hot weather and during exercise.  Limit or avoid drinking alcohol.  Eat a balanced and nutritious diet. Eat plenty of fruits, vegetables, whole grains, and lean protein.  Take vitamins and supplements as directed by your health care provider. Traveling  When traveling, keep these with you: ? Your medical information. ? The names of your health care providers. ? Your medicines.  If you have to travel by air, ask about precautions you should take. Activity  Get plenty of rest.  Avoid activities that will lower your oxygen levels, such as exercising vigorously. General instructions  Do not use any products that contain nicotine or tobacco, such as cigarettes and e-cigarettes. They lower blood oxygen levels. If you need help quitting, ask your health care provider.  Consider wearing a medical alert bracelet.  Avoid high altitudes.  Avoid extreme temperatures and extreme temperature changes.  Keep all follow-up visits as told by your health care provider. This is important. Contact a health care provider if:  You develop joint pain.  Your feet or hands swell or have pain.  You have fatigue. Get help right away if:  You have symptoms of infection. These include: ? Fever. ? Chills. ? Extreme tiredness. ? Irritability. ? Poor eating. ? Vomiting.  You feel dizzy or faint.  You have new abdominal pain, especially on the left side near the stomach area.  You develop priapism.  You have numbness in your arms or legs or have trouble moving them.  You have trouble talking.  You develop pain that cannot be controlled with medicine.  You become short of breath.  You have rapid breathing.  You have a persistent cough.  You have pain in your chest.  You develop a severe headache or stiff neck.  You feel bloated without eating or after eating a small amount of food.  Your skin is pale.  You suddenly lose  vision. Summary  Sickle cell anemia is a condition in which red blood cells have an abnormal "sickle" shape. This disease can cause organ damage and chronic pain, and it can raise your risk of infection.  Sickle cell anemia is a genetic disorder.  Treatment focuses on managing your symptoms and preventing complications of the disease.  Get medical help right away if you have any signs of infection, such as a fever. This information is not intended to replace advice given to you by your health care provider. Make sure you discuss any questions you have with your health care provider. Document Revised: 10/28/2019 Document Reviewed: 10/28/2019 Elsevier Patient Education  [employer].

## 2020-11-14 LAB — BPAM RBC
Blood Product Expiration Date: 202206282359
ISSUE DATE / TIME: 202205271339
Unit Type and Rh: 5100

## 2020-11-14 LAB — TYPE AND SCREEN
ABO/RH(D): A POS
Antibody Screen: NEGATIVE
Unit division: 0

## 2020-11-20 ENCOUNTER — Telehealth: Payer: Self-pay

## 2020-11-20 NOTE — Telephone Encounter (Signed)
hydromorphine 4 mg

## 2020-11-21 ENCOUNTER — Encounter (HOSPITAL_COMMUNITY): Payer: Self-pay | Admitting: *Deleted

## 2020-11-21 ENCOUNTER — Inpatient Hospital Stay (HOSPITAL_COMMUNITY)
Admission: EM | Admit: 2020-11-21 | Discharge: 2020-11-26 | DRG: 812 | Disposition: A | Discharge status: Home / Self Care | Payer: Medicare Other | Attending: Internal Medicine | Admitting: Internal Medicine

## 2020-11-21 DIAGNOSIS — J4520 Mild intermittent asthma, uncomplicated: Secondary | ICD-10-CM | POA: Diagnosis not present

## 2020-11-21 DIAGNOSIS — F1120 Opioid dependence, uncomplicated: Secondary | ICD-10-CM | POA: Diagnosis present

## 2020-11-21 DIAGNOSIS — Z86711 Personal history of pulmonary embolism: Secondary | ICD-10-CM | POA: Diagnosis not present

## 2020-11-21 DIAGNOSIS — D72829 Elevated white blood cell count, unspecified: Secondary | ICD-10-CM | POA: Diagnosis present

## 2020-11-21 DIAGNOSIS — Z7901 Long term (current) use of anticoagulants: Secondary | ICD-10-CM | POA: Diagnosis not present

## 2020-11-21 DIAGNOSIS — F32A Depression, unspecified: Secondary | ICD-10-CM | POA: Diagnosis not present

## 2020-11-21 DIAGNOSIS — Z841 Family history of disorders of kidney and ureter: Secondary | ICD-10-CM | POA: Diagnosis not present

## 2020-11-21 DIAGNOSIS — L299 Pruritus, unspecified: Secondary | ICD-10-CM | POA: Diagnosis not present

## 2020-11-21 DIAGNOSIS — Z20822 Contact with and (suspected) exposure to covid-19: Secondary | ICD-10-CM | POA: Diagnosis not present

## 2020-11-21 DIAGNOSIS — Z83438 Family history of other disorder of lipoprotein metabolism and other lipidemia: Secondary | ICD-10-CM | POA: Diagnosis not present

## 2020-11-21 DIAGNOSIS — F419 Anxiety disorder, unspecified: Secondary | ICD-10-CM | POA: Diagnosis present

## 2020-11-21 DIAGNOSIS — Z79899 Other long term (current) drug therapy: Secondary | ICD-10-CM

## 2020-11-21 DIAGNOSIS — G894 Chronic pain syndrome: Secondary | ICD-10-CM | POA: Diagnosis not present

## 2020-11-21 DIAGNOSIS — K219 Gastro-esophageal reflux disease without esophagitis: Secondary | ICD-10-CM | POA: Diagnosis not present

## 2020-11-21 DIAGNOSIS — D5700 Hb-SS disease with crisis, unspecified: Secondary | ICD-10-CM | POA: Diagnosis present

## 2020-11-21 DIAGNOSIS — Z8249 Family history of ischemic heart disease and other diseases of the circulatory system: Secondary | ICD-10-CM | POA: Diagnosis not present

## 2020-11-21 DIAGNOSIS — D5701 Hb-SS disease with acute chest syndrome: Principal | ICD-10-CM | POA: Diagnosis present

## 2020-11-21 DIAGNOSIS — L309 Dermatitis, unspecified: Secondary | ICD-10-CM | POA: Diagnosis not present

## 2020-11-21 DIAGNOSIS — F329 Major depressive disorder, single episode, unspecified: Secondary | ICD-10-CM | POA: Diagnosis not present

## 2020-11-21 DIAGNOSIS — I2699 Other pulmonary embolism without acute cor pulmonale: Secondary | ICD-10-CM | POA: Diagnosis present

## 2020-11-21 DIAGNOSIS — Z888 Allergy status to other drugs, medicaments and biological substances status: Secondary | ICD-10-CM

## 2020-11-21 DIAGNOSIS — R079 Chest pain, unspecified: Secondary | ICD-10-CM | POA: Diagnosis present

## 2020-11-21 LAB — RETICULOCYTES
Immature Retic Fract: 22.2 % — ABNORMAL HIGH (ref 2.3–15.9)
RBC.: 2.57 MIL/uL — ABNORMAL LOW (ref 3.87–5.11)
Retic Count, Absolute: 244.4 10*3/uL — ABNORMAL HIGH (ref 19.0–186.0)
Retic Ct Pct: 9.5 % — ABNORMAL HIGH (ref 0.4–3.1)

## 2020-11-21 LAB — COMPREHENSIVE METABOLIC PANEL WITH GFR
ALT: 15 U/L (ref 0–44)
AST: 27 U/L (ref 15–41)
Albumin: 4.5 g/dL (ref 3.5–5.0)
Alkaline Phosphatase: 57 U/L (ref 38–126)
Anion gap: 7 (ref 5–15)
BUN: 9 mg/dL (ref 6–20)
CO2: 23 mmol/L (ref 22–32)
Calcium: 8.9 mg/dL (ref 8.9–10.3)
Chloride: 109 mmol/L (ref 98–111)
Creatinine, Ser: 0.61 mg/dL (ref 0.44–1.00)
GFR, Estimated: >60 mL/min (ref >60)
Glucose, Bld: 95 mg/dL (ref 70–99)
Potassium: 3.3 mmol/L — ABNORMAL LOW (ref 3.5–5.1)
Sodium: 139 mmol/L (ref 135–145)
Total Bilirubin: 2.8 mg/dL — ABNORMAL HIGH (ref 0.3–1.2)
Total Protein: 7.7 g/dL (ref 6.5–8.1)

## 2020-11-21 LAB — CBC WITH DIFFERENTIAL/PLATELET
Abs Immature Granulocytes: 0.1 10*3/uL — ABNORMAL HIGH (ref 0.00–0.07)
Basophils Absolute: 0.1 10*3/uL (ref 0.0–0.1)
Basophils Relative: 1 %
Eosinophils Absolute: 0.2 10*3/uL (ref 0.0–0.5)
Eosinophils Relative: 1 %
HCT: 23.1 % — ABNORMAL LOW (ref 36.0–46.0)
Hemoglobin: 7.7 g/dL — ABNORMAL LOW (ref 12.0–15.0)
Immature Granulocytes: 1 %
Lymphocytes Relative: 17 %
Lymphs Abs: 2.6 10*3/uL (ref 0.7–4.0)
MCH: 29.1 pg (ref 26.0–34.0)
MCHC: 33.3 g/dL (ref 30.0–36.0)
MCV: 87.2 fL (ref 80.0–100.0)
Monocytes Absolute: 2 10*3/uL — ABNORMAL HIGH (ref 0.1–1.0)
Monocytes Relative: 13 %
Neutro Abs: 10.9 10*3/uL — ABNORMAL HIGH (ref 1.7–7.7)
Neutrophils Relative %: 67 %
Platelets: 627 10*3/uL — ABNORMAL HIGH (ref 150–400)
RBC: 2.65 MIL/uL — ABNORMAL LOW (ref 3.87–5.11)
RDW: 21 % — ABNORMAL HIGH (ref 11.5–15.5)
WBC: 15.9 10*3/uL — ABNORMAL HIGH (ref 4.0–10.5)
nRBC: 0.1 % (ref 0.0–0.2)

## 2020-11-21 LAB — I-STAT BETA HCG BLOOD, ED (MC, WL, AP ONLY): I-stat hCG, quantitative: <5 m[IU]/mL (ref <5)

## 2020-11-21 LAB — RESP PANEL BY RT-PCR (FLU A&B, COVID) ARPGX2
Influenza A by PCR: NEGATIVE
Influenza B by PCR: NEGATIVE
SARS Coronavirus 2 by RT PCR: NEGATIVE

## 2020-11-21 MED ORDER — DIPHENHYDRAMINE HCL 50 MG/ML IJ SOLN
25.0000 mg | Freq: Once | INTRAMUSCULAR | Status: AC
  Administered 2020-11-21: 25 mg via INTRAVENOUS
  Filled 2020-11-21: qty 1

## 2020-11-21 MED ORDER — NALOXONE HCL 0.4 MG/ML IJ SOLN: 0.4000 mg | INTRAMUSCULAR | Status: DC | PRN

## 2020-11-21 MED ORDER — POLYETHYLENE GLYCOL 3350 17 G PO PACK: 17.0000 g | PACK | Freq: Every day | ORAL | Status: DC | PRN

## 2020-11-21 MED ORDER — SODIUM CHLORIDE 0.9% FLUSH: 9.0000 mL | INTRAVENOUS | Status: DC | PRN

## 2020-11-21 MED ORDER — SENNOSIDES-DOCUSATE SODIUM 8.6-50 MG PO TABS
1.0000 | ORAL_TABLET | Freq: Two times a day (BID) | ORAL | Status: DC
  Administered 2020-11-22 – 2020-11-26 (×8): 1 via ORAL
  Filled 2020-11-21 (×10): qty 1

## 2020-11-21 MED ORDER — HYDROMORPHONE HCL 1 MG/ML IJ SOLN
1.0000 mg | INTRAMUSCULAR | Status: DC | PRN
Start: 2020-11-21 — End: 2020-11-23
  Administered 2020-11-21 – 2020-11-22 (×7): 1 mg via INTRAVENOUS
  Filled 2020-11-21 (×7): qty 1

## 2020-11-21 MED ORDER — SENNOSIDES-DOCUSATE SODIUM 8.6-50 MG PO TABS
1.0000 | ORAL_TABLET | Freq: Every evening | ORAL | Status: DC | PRN
  Administered 2020-11-23: 1 via ORAL

## 2020-11-21 MED ORDER — ADULT MULTIVITAMIN W/MINERALS CH
1.0000 | ORAL_TABLET | Freq: Every day | ORAL | Status: DC
  Administered 2020-11-21 – 2020-11-26 (×6): 1 via ORAL
  Filled 2020-11-21 (×6): qty 1

## 2020-11-21 MED ORDER — GABAPENTIN 300 MG PO CAPS: 300.0000 mg | ORAL_CAPSULE | Freq: Three times a day (TID) | ORAL | Status: DC | PRN

## 2020-11-21 MED ORDER — MOMETASONE FURO-FORMOTEROL FUM 100-5 MCG/ACT IN AERO
2.0000 | INHALATION_SPRAY | Freq: Two times a day (BID) | RESPIRATORY_TRACT | Status: DC
  Administered 2020-11-22 – 2020-11-26 (×6): 2 via RESPIRATORY_TRACT
  Filled 2020-11-21 (×2): qty 8.8

## 2020-11-21 MED ORDER — RIVAROXABAN 20 MG PO TABS
20.0000 mg | ORAL_TABLET | Freq: Every day | ORAL | Status: DC
  Administered 2020-11-21 – 2020-11-25 (×5): 20 mg via ORAL
  Filled 2020-11-21 (×6): qty 1

## 2020-11-21 MED ORDER — DIPHENHYDRAMINE HCL 25 MG PO CAPS
25.0000 mg | ORAL_CAPSULE | ORAL | Status: DC | PRN
  Filled 2020-11-21 (×3): qty 1

## 2020-11-21 MED ORDER — VITAMIN D3 25 MCG (1000 UT) PO CAPS: 1000.0000 [IU] | ORAL_CAPSULE | Freq: Every day | ORAL | Status: DC

## 2020-11-21 MED ORDER — KETOROLAC TROMETHAMINE 15 MG/ML IJ SOLN
15.0000 mg | INTRAMUSCULAR | Status: AC
Start: 2020-11-21 — End: 2020-11-21
  Administered 2020-11-21: 15 mg via INTRAVENOUS
  Filled 2020-11-21: qty 1

## 2020-11-21 MED ORDER — MORPHINE SULFATE (PF) 4 MG/ML IV SOLN: 4.0000 mg | INTRAVENOUS | Status: DC | PRN

## 2020-11-21 MED ORDER — PROMETHAZINE HCL 25 MG PO TABS
25.0000 mg | ORAL_TABLET | ORAL | Status: DC | PRN
  Administered 2020-11-21 – 2020-11-25 (×4): 25 mg via ORAL
  Filled 2020-11-21 (×5): qty 1

## 2020-11-21 MED ORDER — POTASSIUM CHLORIDE 10 MEQ/100ML IV SOLN
10.0000 meq | INTRAVENOUS | Status: AC
  Administered 2020-11-21 (×3): 10 meq via INTRAVENOUS
  Filled 2020-11-21 (×3): qty 100

## 2020-11-21 MED ORDER — ALBUTEROL SULFATE HFA 108 (90 BASE) MCG/ACT IN AERS: 2.0000 | INHALATION_SPRAY | Freq: Two times a day (BID) | RESPIRATORY_TRACT | Status: DC | PRN

## 2020-11-21 MED ORDER — DEXTROSE-NACL 5-0.45 % IV SOLN: INTRAVENOUS | Status: DC

## 2020-11-21 MED ORDER — HYDROMORPHONE HCL 2 MG/ML IJ SOLN
2.0000 mg | INTRAMUSCULAR | Status: AC
  Administered 2020-11-21: 2 mg via INTRAVENOUS
  Filled 2020-11-21: qty 1

## 2020-11-21 MED ORDER — SODIUM CHLORIDE 0.9 % IV BOLUS
1000.0000 mL | Freq: Once | INTRAVENOUS | Status: AC
  Administered 2020-11-21: 1000 mL via INTRAVENOUS

## 2020-11-21 MED ORDER — OXYCODONE HCL ER 10 MG PO T12A: 10.0000 mg | EXTENDED_RELEASE_TABLET | Freq: Two times a day (BID) | ORAL | Status: DC

## 2020-11-21 MED ORDER — ONDANSETRON HCL 4 MG/2ML IJ SOLN
4.0000 mg | INTRAMUSCULAR | Status: DC | PRN
  Administered 2020-11-21: 4 mg via INTRAVENOUS
  Filled 2020-11-21: qty 2

## 2020-11-21 MED ORDER — HYDROMORPHONE 1 MG/ML IV SOLN: INTRAVENOUS | Status: DC

## 2020-11-21 MED ORDER — VITAMIN D 25 MCG (1000 UNIT) PO TABS
1000.0000 [IU] | ORAL_TABLET | Freq: Every day | ORAL | Status: DC
  Administered 2020-11-22 – 2020-11-26 (×5): 1000 [IU] via ORAL
  Filled 2020-11-21 (×5): qty 1

## 2020-11-21 MED ORDER — FAMOTIDINE IN NACL 20-0.9 MG/50ML-% IV SOLN
20.0000 mg | Freq: Two times a day (BID) | INTRAVENOUS | Status: DC
  Administered 2020-11-21: 20 mg via INTRAVENOUS
  Filled 2020-11-21 (×3): qty 50

## 2020-11-21 MED ORDER — SODIUM CHLORIDE 0.9 % IV SOLN
25.0000 mg | INTRAVENOUS | Status: DC | PRN
  Filled 2020-11-21: qty 0.5

## 2020-11-21 MED ORDER — ALBUTEROL SULFATE (2.5 MG/3ML) 0.083% IN NEBU: 2.5000 mg | INHALATION_SOLUTION | Freq: Two times a day (BID) | RESPIRATORY_TRACT | Status: DC | PRN

## 2020-11-21 MED ORDER — MIRTAZAPINE 30 MG PO TABS
45.0000 mg | ORAL_TABLET | Freq: Every day | ORAL | Status: DC
  Administered 2020-11-22 – 2020-11-25 (×4): 45 mg via ORAL
  Filled 2020-11-21 (×5): qty 1

## 2020-11-21 MED ORDER — HYDROMORPHONE 1 MG/ML IV SOLN
INTRAVENOUS | Status: DC
  Administered 2020-11-22: 10 mg via INTRAVENOUS
  Administered 2020-11-23: 3.4 mg via INTRAVENOUS
  Administered 2020-11-23: 6.39 mg via INTRAVENOUS
  Administered 2020-11-23: 0 mg via INTRAVENOUS
  Filled 2020-11-21: qty 30

## 2020-11-21 MED ORDER — KETOROLAC TROMETHAMINE 15 MG/ML IJ SOLN
15.0000 mg | Freq: Four times a day (QID) | INTRAMUSCULAR | Status: AC
  Administered 2020-11-21 – 2020-11-26 (×20): 15 mg via INTRAVENOUS
  Filled 2020-11-21 (×20): qty 1

## 2020-11-21 MED ORDER — DIPHENHYDRAMINE HCL 25 MG PO CAPS
25.0000 mg | ORAL_CAPSULE | ORAL | Status: DC | PRN
  Filled 2020-11-21: qty 1

## 2020-11-21 MED ORDER — HYDROMORPHONE HCL 2 MG/ML IJ SOLN
2.0000 mg | Freq: Once | INTRAMUSCULAR | Status: AC
Start: 2020-11-21 — End: 2020-11-21
  Administered 2020-11-21: 2 mg via INTRAVENOUS
  Filled 2020-11-21: qty 1

## 2020-11-21 MED ORDER — FOLIC ACID 1 MG PO TABS
1.0000 mg | ORAL_TABLET | Freq: Every day | ORAL | Status: DC
  Administered 2020-11-21 – 2020-11-26 (×6): 1 mg via ORAL
  Filled 2020-11-21 (×6): qty 1

## 2020-11-21 MED ORDER — SODIUM CHLORIDE 0.9 % IV SOLN
25.0000 mg | INTRAVENOUS | Status: DC | PRN
  Administered 2020-11-21 – 2020-11-26 (×15): 25 mg via INTRAVENOUS
  Filled 2020-11-21: qty 0.5
  Filled 2020-11-21 (×5): qty 25
  Filled 2020-11-21: qty 0.5
  Filled 2020-11-21 (×8): qty 25
  Filled 2020-11-21 (×3): qty 0.5

## 2020-11-21 MED ORDER — DEFERASIROX 360 MG PO TABS: 1080.0000 mg | ORAL_TABLET | Freq: Every day | ORAL | Status: DC

## 2020-11-21 MED ORDER — HYDROMORPHONE HCL 2 MG/ML IJ SOLN
2.0000 mg | INTRAMUSCULAR | Status: AC
Start: 2020-11-21 — End: 2020-11-21
  Administered 2020-11-21: 2 mg via INTRAVENOUS
  Filled 2020-11-21: qty 1

## 2020-11-21 MED ORDER — VITAMIN B-12 1000 MCG PO TABS
1000.0000 ug | ORAL_TABLET | Freq: Every day | ORAL | Status: DC
  Administered 2020-11-22 – 2020-11-26 (×5): 1000 ug via ORAL
  Filled 2020-11-21 (×5): qty 1

## 2020-11-21 MED ORDER — ONDANSETRON HCL 4 MG PO TABS: 4.0000 mg | ORAL_TABLET | ORAL | Status: DC | PRN

## 2020-11-21 MED ORDER — FAMOTIDINE 20 MG PO TABS
20.0000 mg | ORAL_TABLET | Freq: Two times a day (BID) | ORAL | Status: DC
  Administered 2020-11-22 – 2020-11-26 (×9): 20 mg via ORAL
  Filled 2020-11-21 (×10): qty 1

## 2020-11-21 MED ORDER — VOXELOTOR 500 MG PO TABS: 1500.0000 mg | ORAL_TABLET | Freq: Every day | ORAL | Status: DC

## 2020-11-21 NOTE — H&P (Signed)
History and Physical    [facility] QQI:[id number] DOB: 1980/08/22 DOA: 11/21/2020  PCP: Vevelyn Francois, NP   Patient coming from: Home  Chief Complaint: Pain in the Back and Legs  HPI: Molly Gutierrez is a 40 y.o. 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  She feels that her pain is typical for sickle cell pain crises.  She had some nausea and vomiting.  No abdominal pain and no chest pain or shortness of breath.  She went to the [facility] emergency department earlier today and was given pain meds did not help.  She subsequently left there and presented to [facility] given that her sickle cell doctors are usually here.  She continues to have generalized pruritus and itching and complains of 9 out of 10 in severity and pain.  She denies any other concerns or complaints at this time.  [facility] was asked to admit this patient for sickle cell pain crisis  ED Course: In the ED she was had basic blood work done and was started on IV fluid hydration.  Her COVID testing was negative.  Given multiple doses of Dilaudid IV as well as Benadryl and a dose of IV ketorolac.  Review of Systems: As per HPI otherwise all other systems reviewed and negative.   Past Medical History:  Diagnosis Date  . Asthma   . Eczema   . History of pulmonary embolus (PE)   . Sickle cell anemia (HCC)    Past Surgical History:  Procedure Laterality Date  . CHOLECYSTECTOMY    . ERCP    . JOINT REPLACEMENT    . PORTA CATH INSERTION    . TUBAL LIGATION    . WISDOM TOOTH EXTRACTION     SOCIAL HISOTRY  reports that she has never smoked. She has never used smokeless tobacco. She reports that she does not drink alcohol and does not use drugs.  Allergies   Allergen Reactions  . Cefaclor Hives and Swelling  . Hydroxyurea Palpitations and Other (See Comments)    Lowers "blood levels" and heart rate (causes HYPOtension); "it messes me up, it drops my levels and stuff"   . Omeprazole Anaphylaxis and Other (See Comments)    Causes "sharp pains in the stomach"  . Ketamine Palpitations and Other (See Comments)    "Pt states she has had previous reaction to ketamine. States she becomes flushed, heart races, dizzy, and feels like she is going to pass out."   Family History  Problem Relation Age of Onset  . Renal Disease Mother   . Hypertension Mother   . High Cholesterol Mother    Prior to Admission medications   Medication Sig Start Date End Date Taking? Authorizing Provider  albuterol (VENTOLIN HFA) 108 (90 Base) MCG/ACT inhaler Inhale 2 puffs into the lungs 2 (two) times daily as needed for wheezing or shortness of breath. 04/13/20 04/13/21 Yes Vevelyn Francois, NP  celecoxib (CELEBREX) 200 MG capsule Take 1 capsule (200 mg total) by mouth daily. Patient taking differently: Take 200 mg by mouth daily as needed (for pain). 08/02/20 08/02/21 Yes Vevelyn Francois, NP  Cholecalciferol (VITAMIN D3) 25 MCG (1000 UT) CAPS Take 1 capsule (1,000 Units total) by mouth daily. 04/13/20 04/13/21 Yes King, Diona Foley,  NP  Deferasirox (JADENU) 360 MG TABS Take 3 tablets (1,080 mg total) by mouth daily before breakfast. 04/13/20 04/13/21 Yes Vevelyn Francois, NP  diphenhydrAMINE (BENADRYL) 25 mg capsule Take 25 mg by mouth 3 (three) times daily as needed for itching.   Yes [provider]  folic acid (FOLVITE) 1 MG tablet Take 1 tablet (1 mg total) by mouth daily. 04/13/20 04/13/21 Yes Vevelyn Francois, NP  gabapentin (NEURONTIN) 300 MG capsule Take 1 capsule (300 mg total) by mouth 3 (three) times daily. Patient taking differently: Take 300 mg by mouth 3 (three) times daily as needed (for nerve pain). 04/13/20 04/13/21 Yes King, Diona Foley, NP  HYDROmorphone  (DILAUDID) 4 MG tablet Take 1 tablet (4 mg total) by mouth every 6 (six) hours as needed for up to 15 days for moderate pain or severe pain. 11/13/20 11/28/20 Yes [name], MD  mirtazapine (REMERON) 45 MG tablet Take 1 tablet (45 mg total) by mouth at bedtime. 04/13/20 04/13/21 Yes King, Diona Foley, NP  mometasone-formoterol (DULERA) 100-5 MCG/ACT AERO Inhale 2 puffs into the lungs daily as needed for wheezing or shortness of breath. 04/13/20 04/13/21 Yes Vevelyn Francois, NP  omeprazole (PRILOSEC) 20 MG capsule Take 1 capsule (20 mg total) by mouth 2 (two) times daily before a meal. Patient taking differently: Take 20 mg by mouth 2 (two) times daily as needed (for reflux). 08/02/20 08/02/21 Yes King, Crystal M, NP  OXBRYTA 500 MG TABS tablet Take 1,500 mg by mouth daily. 10/30/20  Yes [provider]  polyethylene glycol (MIRALAX / GLYCOLAX) 17 g packet Take 17 g by mouth daily as needed for mild constipation ([place]).   Yes [provider]  promethazine (PHENERGAN) 25 MG tablet Take 0.5-1 tablets (12.5-25 mg total) by mouth every 6 (six) hours as needed for nausea or vomiting. 10/25/20 10/25/21 Yes Vevelyn Francois, NP  vitamin B-12 (CYANOCOBALAMIN) 1000 MCG tablet Take 1 tablet (1,000 mcg total) by mouth daily. 04/13/20 04/13/21 Yes King, Crystal M, NP  XARELTO 20 MG TABS tablet Take 1 tablet (20 mg total) by mouth daily with supper. Patient taking differently: Take 20 mg by mouth daily after supper. 10/25/20 10/25/21 Yes Vevelyn Francois, NP   Physical Exam: Vitals:   11/21/20 1430 11/21/20 1500 11/21/20 1530 11/21/20 1600  BP: 112/84 114/84 107/72 113/75  Pulse: 84 81 77 78  Resp: 10 17 14 14   Temp:      TempSrc:      SpO2: 100% 99% 97% 97%  Weight:      Height:       Constitutional: Thin chronically ill-appearing African-American female currently in some distress complaining of significant pain and itching Eyes: Lids and conjunctivae normal, sclerae anicteric   ENMT: External Ears, Nose appear normal. Grossly normal hearing.  Neck: Appears normal, supple, no cervical masses, normal ROM, no appreciable thyromegaly; no JVD Respiratory: Diminished to auscultation bilaterally, no wheezing, rales, rhonchi or crackles. Normal respiratory effort and patient is not tachypenic. No accessory muscle use.  Unlabored breathing Cardiovascular: RRR, no murmurs / rubs / gallops. S1 and S2 auscultated.  No appreciable extremity edema Abdomen: Soft, non-tender, non-distended. Bowel sounds positive.  GU: Deferred. Musculoskeletal: No clubbing / cyanosis of digits/nails. No joint deformity upper and lower extremities.  Skin: No rashes, lesions, ulcers on limited skin evaluation but she has generalized pruritus and itching. No induration; Warm and dry.  Neurologic: CN 2-12 grossly intact with no focal deficits. Romberg sign  and cerebellar reflexes not assessed.  Psychiatric: Normal judgment and insight. Alert and oriented x 3. Normal mood and appropriate affect.   Labs on Admission: I have personally reviewed following labs and imaging studies  CBC: Recent Labs  Lab 11/21/20 1421  WBC 15.9*  NEUTROABS 10.9*  HGB 7.7*  HCT 23.1*  MCV 87.2  PLT 287*   Basic Metabolic Panel: Recent Labs  Lab 11/21/20 1421  NA 139  K 3.3*  CL 109  CO2 23  GLUCOSE 95  BUN 9  CREATININE 0.61  CALCIUM 8.9   GFR: Estimated Creatinine Clearance: 78.1 mL/min (by C-G formula based on SCr of 0.61 mg/dL). Liver Function Tests: Recent Labs  Lab 11/21/20 1421  AST 27  ALT 15  ALKPHOS 57  BILITOT 2.8*  PROT 7.7  ALBUMIN 4.5   No results for input(s): LIPASE, AMYLASE in the last 168 hours. No results for input(s): AMMONIA in the last 168 hours. Coagulation Profile: No results for input(s): INR, PROTIME in the last 168 hours. Cardiac Enzymes: No results for input(s): CKTOTAL, CKMB, CKMBINDEX, TROPONINI in the last 168 hours. BNP (last 3 results) No results for input(s):  PROBNP in the last 8760 hours. HbA1C: No results for input(s): HGBA1C in the last 72 hours. CBG: No results for input(s): GLUCAP in the last 168 hours. Lipid Profile: No results for input(s): CHOL, HDL, LDLCALC, TRIG, CHOLHDL, LDLDIRECT in the last 72 hours. Thyroid Function Tests: No results for input(s): TSH, T4TOTAL, FREET4, T3FREE, THYROIDAB in the last 72 hours. Anemia Panel: Recent Labs    11/21/20 1421  RETICCTPCT 9.5*   Urine analysis:    Component Value Date/Time   COLORURINE YELLOW 08/14/2019 1800   APPEARANCEUR CLEAR 08/14/2019 1800   LABSPEC 1.011 08/14/2019 1800   PHURINE 7.0 08/14/2019 1800   GLUCOSEU NEGATIVE 08/14/2019 1800   HGBUR SMALL (A) 08/14/2019 1800   BILIRUBINUR neg 08/03/2020 1429   KETONESUR negative 05/24/2020 1429   KETONESUR NEGATIVE 08/14/2019 1800   PROTEINUR Positive (A) 08/03/2020 1429   PROTEINUR 30 (A) 08/14/2019 1800   UROBILINOGEN 1.0 08/03/2020 1429   NITRITE positive 08/03/2020 1429   NITRITE NEGATIVE 08/14/2019 1800   LEUKOCYTESUR Negative 08/03/2020 1429   LEUKOCYTESUR NEGATIVE 08/14/2019 1800   Sepsis Labs: !!!!!!!!!!!!!!!!!!!!!!!!!!!!!!!!!!!!!!!!!!!! @LABRCNTIP (procalcitonin:4,lacticidven:4) )No results found for this or any previous visit (from the past 240 hour(s)).   Radiological Exams on Admission: No results found.  EKG: Independently reviewed. Showed a Sinus Rhythm of 86 and a QTc of 486  Assessment/Plan Active Problems:   Sickle cell anemia with pain (HCC)  Sickle cell Disease with Pain Crisis -Admit to Inpatient Telemetry -No worsening chest pain to suggest acute chest syndrome but will get a CXR -Continue IV fluids with D5 NS -Start IV Dilaudid PCA with settings of 2mg  load, 0.5 mg, 10-minute lockout, and 3 mg/h. -Start Toradol Scheduled  -C/w Folic Acid and MVI+Minerals  -Monitor vital signs very closely, reevaluate pain scale regularly, and supplemental oxygen as needed. -C/w Voxe;ptpr 1500 mg po Daily     GERD -C/w H2 Blocker given Anaphylaxis to PPI (Holding Omeprazole)   Hyperbilirubinemia -Slightly worsened and now 2.8 -C/w Benadryl   Hx of PE -C/w Xarelto   Chronic pain syndrome -Will restart home medication as pain improves and hold Celecoxib. Use PCA Dilaudid for now  Asthma -Currently not in Exacerbation -C/w Mometasone-Formoterol 100-5 mcg/Act 2 puff IH and Albuterol IH 2 puff IH BIDprn Wheezing and SOB   Sickle cell anemia -There is no clinical indication  for blood transfusion at this time. -HemoGlobin/hematocrit went from 7.2/21.2 -> 7.0/21.0 -> 7.7/23.1 -Check Anemia Panel in the AM  -C/w Deferasirox  -Continue to Monitor For S/Sx of Bleeding    Depression/anxiety -Continue Mirtazapine 45 mg po qHS  DVT prophylaxis: Anticoagulated with Xarelto Code Status: FULL CODE Family Communication: No family present at bedside  Disposition Plan: Inpatient Telemetry and will D/C Home when off PCA Pump Consults called: None Admission status: Inpatient Telemetry  Severity of Illness: The appropriate patient status for this patient is INPATIENT. Inpatient status is judged to be reasonable and necessary in order to provide the required intensity of service to ensure the patient's safety. The patient's presenting symptoms, physical exam findings, and initial radiographic and laboratory data in the context of their chronic comorbidities is felt to place them at high risk for further clinical deterioration. Furthermore, it is not anticipated that the patient will be medically stable for discharge from the hospital within 2 midnights of admission. The following factors support the patient status of inpatient.   " The patient's presenting symptoms include Itching and Pain. " The worrisome physical exam findings include Generalized pruritis. " The initial radiographic and laboratory data are worrisome because of Sickle Cell and Anemia. " The chronic co-morbidities are listed as  above  * I certify that at the point of admission it is my clinical judgment that the patient will require inpatient hospital care spanning beyond 2 midnights from the point of admission due to high intensity of service, high risk for further deterioration and high frequency of surveillance required.Kerney Elbe, D.O. [facility] is on [place]  If 7PM-7AM, please contact night-coverage [email or web address]  11/21/2020, 4:53 PM

## 2020-11-21 NOTE — Telephone Encounter (Signed)
Sent on 5/30

## 2020-11-21 NOTE — ED Provider Notes (Signed)
[place] DEPT Provider Note   CSN: [id number] Arrival date & time: 11/21/20  1321     History Chief Complaint  Patient presents with  . Sickle Cell Pain Crisis    Molly Gutierrez is a 40 y.o. female.  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  Complaining of back and leg pain that started 2 days ago.  No trauma.  No fever.  Pain is typical of exacerbation.  Associate with some nausea and vomiting.  No abdominal pain.  No chest pain or shortness of breath.  She was at [facility] emergency department earlier today but she said that the pain meds did not help.  She said her sickle cell doctor's appear so that is why she drove here.  The history is provided by the patient.  Sickle Cell Pain Crisis Location:  Back and lower extremity Severity:  Severe Onset quality:  Gradual Duration:  3 days Similar to previous crisis episodes: yes   Timing:  Constant Progression:  Unchanged Chronicity:  Recurrent Relieved by:  Nothing Worsened by:  Activity and movement Ineffective treatments:  Prescription drugs Associated symptoms: nausea and vomiting   Associated symptoms: no chest pain, no cough, no fever, no headaches, no shortness of breath and no sore throat   Risk factors: no smoking        Past Medical History:  Diagnosis Date  . Asthma   . Eczema   . History of pulmonary embolus (PE)   . Sickle cell anemia [facility])     Patient Active Problem List   Diagnosis Date Noted  . Sickle cell disease with crisis ([place]) 10/05/2020  . 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 Sickle cell anemia with pain ([place]) 04/24/2020  . Mild intermittent asthma 03/31/2020  . Sickle cell anemia with crisis ([place]) 01/07/2020  . Sickle cell crisis acute chest syndrome ([place]) 07/29/2019  . Drug-seeking behavior 07/07/2019  . Sinus tachycardia 07/07/2019  . Therapeutic opioid-induced constipation (OIC) 07/07/2019  . Breast mass in  female 06/29/2019  . Atelectasis of right lung 06/29/2019  . Sickle cell crisis ([place]) 06/27/2019  . Sickle cell pain crisis ([place]) 04/19/2019  . Pneumonia 03/27/2019  . 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 Anterior chest wall pain 11/13/2018  . Epigastric abdominal pain 11/13/2018  . Hematuria 11/13/2018  . Hyperbilirubinemia 11/12/2018  . Long term current use of anticoagulant therapy 11/12/2018  . History of pulmonary embolism 09/26/2018  . Hx of pulmonary embolism ([place]) 09/26/2018  . Opioid dependence ([place]) 09/13/2018  . Port-A-Cath in place 09/13/2018  . Narcotic abuse, continuous ([place]) 08/28/2018  . Anemia 07/03/2018  . Personal history of other venous thrombosis and embolism 07/03/2018  . History of transfusion 07/03/2018  . Gastro-esophageal reflux disease without esophagitis 06/02/2018  . Asthma 05/19/2018  . Anxiety and depression 10/30/2017  . At risk for sepsis 06/13/2017  . Mitral regurgitation 02/01/2014  . Itching 12/13/2013  . Nausea & vomiting 12/13/2013  . 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 Frequent complaints of pain 11/01/2013  . Hypokalemia 09/19/2013  . S/P total hip arthroplasty 05/19/2013  . 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 Lower urinary tract infectious disease 03/02/2013  . Chest pain 11/14/2012  . Sickle-cell anemia ([place]) 10/30/2012  . Pain management 08/01/2012  . Reticulocytosis 07/31/2012  . 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 Essential (hemorrhagic) thrombocythemia ([place]) 04/03/2012  . Leukocytosis 03/27/2012  .  History of gestational diabetes 10/07/2011  . 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 CHOLECYSTECTOMY    . ERCP    . JOINT REPLACEMENT    . PORTA CATH INSERTION    . TUBAL LIGATION    . WISDOM TOOTH EXTRACTION       OB History   No obstetric  history on file.     Family History  Problem Relation Age of Onset  . Renal Disease Mother   . Hypertension Mother   . High Cholesterol Mother     Social History   Tobacco Use  . Smoking status: Never Smoker  . Smokeless tobacco: Never Used  Vaping Use  . Vaping Use: Never used  Substance Use Topics  . Alcohol use: Never  . Drug use: Never    Home Medications Prior to Admission medications   Medication Sig Start Date End Date Taking? Authorizing Provider  albuterol (VENTOLIN HFA) 108 (90 Base) MCG/ACT inhaler Inhale 2 puffs into the lungs 2 (two) times daily as needed for wheezing or shortness of breath. 04/13/20 04/13/21  Vevelyn Francois, NP  budesonide-formoterol (SYMBICORT) 80-4.5 MCG/ACT inhaler Inhale 2 puffs into the lungs 2 (two) times daily. 04/17/20   Vevelyn Francois, NP  celecoxib (CELEBREX) 200 MG capsule Take 1 capsule (200 mg total) by mouth daily. Patient taking differently: Take 200 mg by mouth daily as needed (for pain). 08/02/20 08/02/21  Vevelyn Francois, NP  Cholecalciferol (VITAMIN D3) 25 MCG (1000 UT) CAPS Take 1 capsule (1,000 Units total) by mouth daily. 04/13/20 04/13/21  Vevelyn Francois, NP  Deferasirox (JADENU) 360 MG TABS Take 3 tablets (1,080 mg total) by mouth daily before breakfast. 04/13/20 04/13/21  Vevelyn Francois, NP  diphenhydrAMINE (BENADRYL) 25 mg capsule Take 25 mg by mouth 3 (three) times daily as needed for itching.    [provider]  folic acid (FOLVITE) 1 MG tablet Take 1 tablet (1 mg total) by mouth daily. 04/13/20 04/13/21  Vevelyn Francois, NP  gabapentin (NEURONTIN) 300 MG capsule Take 1 capsule (300 mg total) by mouth 3 (three) times daily. Patient taking differently: Take 300 mg by mouth 3 (three) times daily as needed (for nerve pain). 04/13/20 04/13/21  Vevelyn Francois, NP  HYDROmorphone (DILAUDID) 4 MG tablet Take 1 tablet (4 mg total) by mouth every 6 (six) hours as needed for up to 15 days for moderate pain or severe pain.  11/13/20 11/28/20  [name], MD  mirtazapine (REMERON) 45 MG tablet Take 1 tablet (45 mg total) by mouth at bedtime. 04/13/20 04/13/21  Vevelyn Francois, NP  mometasone-formoterol (DULERA) 100-5 MCG/ACT AERO Inhale 2 puffs into the lungs daily as needed for wheezing or shortness of breath. Patient not taking: No sig reported 04/13/20 04/13/21  Vevelyn Francois, NP  omeprazole (PRILOSEC) 20 MG capsule Take 1 capsule (20 mg total) by mouth 2 (two) times daily before a meal. Patient taking differently: Take 20 mg by mouth 2 (two) times daily as needed (for reflux). 08/02/20 08/02/21  Vevelyn Francois, NP  OXBRYTA 500 MG TABS tablet Take by mouth. Patient not taking: No sig reported 10/30/20   [provider]  polyethylene glycol (MIRALAX / GLYCOLAX) 17 g packet Take 17 g by mouth daily as needed for mild constipation ([place]).    [provider]  promethazine (PHENERGAN) 25 MG tablet Take 0.5-1 tablets (12.5-25 mg total)  by mouth every 6 (six) hours as needed for nausea or vomiting. 10/25/20 10/25/21  Vevelyn Francois, NP  senna-docusate (SENOKOT-S) 8.6-50 MG tablet Take 1 tablet by mouth at bedtime as needed for mild constipation. 10/19/20   [provider]  vitamin B-12 (CYANOCOBALAMIN) 1000 MCG tablet Take 1 tablet (1,000 mcg total) by mouth daily. 04/13/20 04/13/21  Vevelyn Francois, NP  XARELTO 20 MG TABS tablet Take 1 tablet (20 mg total) by mouth daily with supper. Patient taking differently: Take 20 mg by mouth daily after supper. 10/25/20 10/25/21  Vevelyn Francois, NP    Allergies    Cefaclor, Hydroxyurea, Omeprazole, and Ketamine  Review of Systems   Review of Systems  Constitutional: Negative for fever.  HENT: Negative for sore throat.   Eyes: Negative for visual disturbance.  Respiratory: Negative for cough and shortness of breath.   Cardiovascular: Negative for chest pain.  Gastrointestinal: Positive for nausea and vomiting. Negative for abdominal  pain.  Genitourinary: Negative for dysuria.  Musculoskeletal: Positive for back pain and myalgias.  Skin: Negative for rash.  Neurological: Negative for headaches.    Physical Exam Updated Vital Signs BP 110/69   Pulse 89   Temp 98.5 F (36.9 C) (Oral)   Resp (!) 23   Ht 5\' 3"  (1.6 m)   Wt 57.2 kg   LMP 10/24/2020   SpO2 100%   BMI 22.32 kg/m   Physical Exam Vitals and nursing note reviewed.  Constitutional:      General: She is not in acute distress.    Appearance: Normal appearance. She is well-developed.  HENT:     Head: Normocephalic and atraumatic.  Eyes:     Conjunctiva/sclera: Conjunctivae normal.  Cardiovascular:     Rate and Rhythm: Normal rate and regular rhythm.     Pulses: Normal pulses.     Heart sounds: No murmur heard.   Pulmonary:     Effort: Pulmonary effort is normal. No respiratory distress.     Breath sounds: Normal breath sounds.  Abdominal:     Palpations: Abdomen is soft.     Tenderness: There is no abdominal tenderness.  Musculoskeletal:        General: Normal range of motion.     Cervical back: Neck supple.     Right lower leg: No edema.     Left lower leg: No edema.  Skin:    General: Skin is warm and dry.  Neurological:     General: No focal deficit present.     Mental Status: She is alert.     ED Results / Procedures / Treatments   Labs (all labs ordered are listed, but only abnormal results are displayed) Labs Reviewed  CBC WITH DIFFERENTIAL/PLATELET - Abnormal; Notable for the following components:      Result Value   WBC 15.9 (*)    RBC 2.65 (*)    Hemoglobin 7.7 (*)    HCT 23.1 (*)    RDW 21.0 (*)    Platelets 627 (*)    Neutro Abs 10.9 (*)    Monocytes Absolute 2.0 (*)    Abs Immature Granulocytes 0.10 (*)    All other components within normal limits  RETICULOCYTES - Abnormal; Notable for the following components:   Retic Ct Pct 9.5 (*)    RBC. 2.57 (*)    Retic Count, Absolute 244.4 (*)    Immature Retic  Fract 22.2 (*)    All other components within normal limits  COMPREHENSIVE METABOLIC  PANEL - Abnormal; Notable for the following components:   Potassium 3.3 (*)    Total Bilirubin 2.8 (*)    All other components within normal limits  RESP PANEL BY RT-PCR (FLU A&B, COVID) ARPGX2  URINALYSIS, ROUTINE W REFLEX MICROSCOPIC  CBC WITH DIFFERENTIAL/PLATELET  COMPREHENSIVE METABOLIC PANEL  PHOSPHORUS  MAGNESIUM  VITAMIN B12  FOLATE  IRON AND TIBC  FERRITIN  RETICULOCYTES  I-STAT BETA HCG BLOOD, ED ([facility], [facility], [facility] ONLY)    EKG EKG Interpretation  Date/Time:  Tuesday November 21 2020 14:16:41 EDT Ventricular Rate:  86 PR Interval:  155 QRS Duration: 82 QT Interval:  406 QTC Calculation: 486 R Axis:   47 Text Interpretation: Sinus rhythm Borderline T abnormalities, anterior leads Borderline prolonged QT interval No significant change since prior 4/22 Confirmed by Aletta Edouard [phone]) on 11/21/2020 2:35:02 PM   Radiology No results found.  Procedures Procedures   Medications Ordered in ED Medications  promethazine (PHENERGAN) tablet 25 mg (25 mg Oral Given 11/21/20 1439)  dextrose 5 %-0.45 % sodium chloride infusion ( Intravenous Infusion Verify 11/21/20 1835)  potassium chloride 10 mEq in 100 mL IVPB ( Intravenous Infusion Verify 11/21/20 1835)  ketorolac (TORADOL) 15 MG/ML injection 15 mg (15 mg Intravenous Given 11/21/20 1440)  HYDROmorphone (DILAUDID) injection 2 mg (2 mg Intravenous Given 11/21/20 1440)  HYDROmorphone (DILAUDID) injection 2 mg (2 mg Intravenous Given 11/21/20 1523)  diphenhydrAMINE (BENADRYL) injection 25 mg (25 mg Intravenous Given 11/21/20 1440)  HYDROmorphone (DILAUDID) injection 2 mg (2 mg Intravenous Given 11/21/20 1607)  diphenhydrAMINE (BENADRYL) injection 25 mg (25 mg Intravenous Given 11/21/20 1607)  sodium chloride 0.9 % bolus 1,000 mL (0 mLs Intravenous Stopped 11/21/20 1814)  diphenhydrAMINE (BENADRYL) injection 25 mg (25 mg Intravenous Given 11/21/20 1812)  HYDROmorphone  (DILAUDID) injection 2 mg (2 mg Intravenous Given 11/21/20 1813)    ED Course  I have reviewed the triage vital signs and the nursing notes.  Pertinent labs & imaging results that were available during my care of the patient were reviewed by me and considered in my medical decision making (see chart for details).  Clinical Course as of 11/21/20 1856  Tue Nov 21, 2020  1509 Patient's member of the treatment team from [facility] called me.  It sounds like they have increased his Lexapro back in early May along with increase the frequency of his as needed clonazepam. [MB]  1551 Patient states she is a little improved.  She thinks she will need a third dose and is asking for some more Benadryl as she is itching her arms. [MB]  [id number] Patient still says she is not comfortable enough after her third dose of pain medication.  I let her know that I would call the hospitalist to see if they would consider admission.  I also updated her that the hospital is very full and she likely will not get a bed upstairs tonight. [MB]  1649 Discussed with [facility] hospitalist Dr Alfredia Ferguson who asked if I could start some IV fluids and get a urinalysis.  They will be by to see patient. [MB]    Clinical Course User Index [MB] Hayden Rasmussen, MD   MDM Rules/Calculators/A&P                          This patient complains of back and leg pain consistent with her prior sickle cell pain crises; this involves an extensive number of treatment Options and is a complaint that carries  with it a high risk of complications and Morbidity. The differential includes sickle cell pain crisis, acute chest, PE, UTI  I ordered, reviewed and interpreted labs, which included CBC with elevated white count consistent with priors, hemoglobin similar to priors, chemistries fairly unremarkable other than mildly low potassium and elevated bili, reticulocyte elevated, COVID and flu testing negative, pregnancy test negative I ordered medication IV pain  medication and Benadryl for itching, IV fluids IV Toradol with minimal improvement in her pain Previous records obtained and reviewed in epic including prior ED visit earlier this morning in [place] I consulted Dr. Alfredia Ferguson and discussed lab and imaging findings  Critical Interventions: None  After the interventions stated above, I reevaluated the patient and found patient to be appearing very comfortable although states her pain is unresolved.  She does not feel she can return home in current condition.  Final Clinical Impression(s) / ED Diagnoses Final diagnoses:  Sickle cell pain crisis [facility])    Rx / DC Orders ED Discharge Orders    None       Hayden Rasmussen, MD 11/21/20 1900

## 2020-11-21 NOTE — ED Provider Notes (Signed)
Emergency Medicine Provider Triage Evaluation Note  [facility] , a 40 y.o. female  was evaluated in triage.  Pt complains of patient reports she has been having back pain for the past 2 days it feels typical for her sickle cell.  She also reports some pain in her right leg.  Denies any associated chest pain or shortness of breath.  No fever or cough.  No abdominal pain, some mild nausea.  She has been taking her home hydromorphone without improvement.  Review of Systems  Positive: Back pain, leg pain Negative: Fever, chest pain, shortness of breath, cough  Physical Exam  BP (!) 177/75 (BP Location: Right Arm)   Pulse 97   Temp 98.8 F (37.1 C) (Oral)   Resp 16   Ht 5\' 3"  (1.6 m)   Wt 57.2 kg   LMP 10/24/2020   SpO2 97%   BMI 22.32 kg/m  Gen:   Awake, no distress   Resp:  Normal effort  MSK:   Moves extremities without difficulty  Other:   Medical Decision Making  Medically screening exam initiated at 2:03 PM.  Appropriate orders placed.  [facility] was informed that the remainder of the evaluation will be completed by another provider, this initial triage assessment does not replace that evaluation, and the importance of remaining in the ED until their evaluation is complete.     Jacqlyn Larsen, PA-C 11/21/20 [place], Ankit, MD 11/21/20 [phone]

## 2020-11-21 NOTE — ED Triage Notes (Signed)
Pt with hx of sickle cell anemia complains of back pain x 2 days.

## 2020-11-21 NOTE — ED Notes (Signed)
ED Provider at bedside. 

## 2020-11-22 ENCOUNTER — Inpatient Hospital Stay (HOSPITAL_COMMUNITY): Payer: Medicare Other

## 2020-11-22 ENCOUNTER — Other Ambulatory Visit: Payer: Self-pay

## 2020-11-22 DIAGNOSIS — D5701 Hb-SS disease with acute chest syndrome: Secondary | ICD-10-CM | POA: Diagnosis not present

## 2020-11-22 DIAGNOSIS — G894 Chronic pain syndrome: Secondary | ICD-10-CM

## 2020-11-22 DIAGNOSIS — D5700 Hb-SS disease with crisis, unspecified: Secondary | ICD-10-CM | POA: Diagnosis not present

## 2020-11-22 LAB — URINALYSIS, ROUTINE W REFLEX MICROSCOPIC
Bilirubin Urine: NEGATIVE
Glucose, UA: NEGATIVE mg/dL
Ketones, ur: NEGATIVE mg/dL
Leukocytes,Ua: NEGATIVE
Nitrite: NEGATIVE
Protein, ur: NEGATIVE mg/dL
Specific Gravity, Urine: 1.01 (ref 1.005–1.030)
pH: 6 (ref 5.0–8.0)

## 2020-11-22 LAB — RETICULOCYTES
Immature Retic Fract: 12.1 % (ref 2.3–15.9)
RBC.: 2.35 MIL/uL — ABNORMAL LOW (ref 3.87–5.11)
Retic Count, Absolute: 214.1 10*3/uL — ABNORMAL HIGH (ref 19.0–186.0)
Retic Ct Pct: 9.1 % — ABNORMAL HIGH (ref 0.4–3.1)

## 2020-11-22 LAB — CBC WITH DIFFERENTIAL/PLATELET
Abs Immature Granulocytes: 0.09 10*3/uL — ABNORMAL HIGH (ref 0.00–0.07)
Basophils Absolute: 0.1 10*3/uL (ref 0.0–0.1)
Basophils Relative: 1 %
Eosinophils Absolute: 0.4 10*3/uL (ref 0.0–0.5)
Eosinophils Relative: 2 %
HCT: 20.7 % — ABNORMAL LOW (ref 36.0–46.0)
Hemoglobin: 7 g/dL — ABNORMAL LOW (ref 12.0–15.0)
Immature Granulocytes: 1 %
Lymphocytes Relative: 28 %
Lymphs Abs: 4.3 10*3/uL — ABNORMAL HIGH (ref 0.7–4.0)
MCH: 29.8 pg (ref 26.0–34.0)
MCHC: 33.8 g/dL (ref 30.0–36.0)
MCV: 88.1 fL (ref 80.0–100.0)
Monocytes Absolute: 2 10*3/uL — ABNORMAL HIGH (ref 0.1–1.0)
Monocytes Relative: 13 %
Neutro Abs: 8.3 10*3/uL — ABNORMAL HIGH (ref 1.7–7.7)
Neutrophils Relative %: 55 %
Platelets: 589 10*3/uL — ABNORMAL HIGH (ref 150–400)
RBC: 2.35 MIL/uL — ABNORMAL LOW (ref 3.87–5.11)
RDW: 21.3 % — ABNORMAL HIGH (ref 11.5–15.5)
WBC: 15.2 10*3/uL — ABNORMAL HIGH (ref 4.0–10.5)
nRBC: 0.2 % (ref 0.0–0.2)

## 2020-11-22 LAB — COMPREHENSIVE METABOLIC PANEL
ALT: 14 U/L (ref 0–44)
AST: 24 U/L (ref 15–41)
Albumin: 3.9 g/dL (ref 3.5–5.0)
Alkaline Phosphatase: 51 U/L (ref 38–126)
Anion gap: 6 (ref 5–15)
BUN: 8 mg/dL (ref 6–20)
CO2: 23 mmol/L (ref 22–32)
Calcium: 8.4 mg/dL — ABNORMAL LOW (ref 8.9–10.3)
Chloride: 110 mmol/L (ref 98–111)
Creatinine, Ser: 0.72 mg/dL (ref 0.44–1.00)
GFR, Estimated: >60 mL/min (ref >60)
Glucose, Bld: 97 mg/dL (ref 70–99)
Potassium: 3.8 mmol/L (ref 3.5–5.1)
Sodium: 139 mmol/L (ref 135–145)
Total Bilirubin: 2.1 mg/dL — ABNORMAL HIGH (ref 0.3–1.2)
Total Protein: 7.1 g/dL (ref 6.5–8.1)

## 2020-11-22 LAB — IRON AND TIBC
Iron: 42 ug/dL (ref 28–170)
Saturation Ratios: 24 % (ref 10.4–31.8)
TIBC: 172 ug/dL — ABNORMAL LOW (ref 250–450)
UIBC: 130 ug/dL

## 2020-11-22 LAB — FOLATE: Folate: 31.6 ng/mL (ref >5.9)

## 2020-11-22 LAB — MAGNESIUM: Magnesium: 2.2 mg/dL (ref 1.7–2.4)

## 2020-11-22 LAB — PHOSPHORUS: Phosphorus: 3.8 mg/dL (ref 2.5–4.6)

## 2020-11-22 LAB — FERRITIN: Ferritin: 1900 ng/mL — ABNORMAL HIGH (ref 11–307)

## 2020-11-22 LAB — VITAMIN B12: Vitamin B-12: 804 pg/mL (ref 180–914)

## 2020-11-22 MED ORDER — CHLORHEXIDINE GLUCONATE CLOTH 2 % EX PADS
6.0000 | MEDICATED_PAD | Freq: Every day | CUTANEOUS | Status: DC
  Administered 2020-11-22 – 2020-11-26 (×5): 6 via TOPICAL

## 2020-11-22 MED ORDER — SODIUM CHLORIDE 0.9 % IV SOLN
12.5000 mg | Freq: Four times a day (QID) | INTRAVENOUS | Status: DC | PRN
  Administered 2020-11-23 – 2020-11-24 (×3): 12.5 mg via INTRAVENOUS
  Filled 2020-11-22 (×3): qty 12.5

## 2020-11-22 NOTE — Progress Notes (Signed)
Subjective: Molly Gutierrez is a 40 year old female with a medical history significant to sickle cell disease, chronic pain syndrome, opiate dependence and tolerance, history of mild intermittent asthma, and history of PE on anticoagulation was admitted overnight for sickle cell pain crisis. Patient continues to complain of pain primarily to upper back and substernal region.  She rates her pain as 8/10.  She denies any headache, dizziness, shortness of breath, urinary symptoms, nausea, vomiting, or diarrhea.  Objective:  Vital signs in last 24 hours:  Vitals:   11/22/20 0900 11/22/20 0930 11/22/20 1230 11/22/20 1400  BP: 125/90 114/78 117/68 92/60  Pulse: 72 72 68 68  Resp:  15 16 16   Temp:    98.6 F (37 C)  TempSrc:    Oral  SpO2: 98% 99% 97% 98%  Weight:      Height:        Intake/Output from previous day:   Intake/Output Summary (Last 24 hours) at 11/22/2020 1518 Last data filed at 11/21/2020 1835 Gross per 24 hour  Intake 1205.02 ml  Output --  Net 1205.02 ml    Physical Exam: General: Alert, awake, oriented x3, in no acute distress.  HEENT: [place]/AT PEERL, EOMI Neck: Trachea midline,  no masses, no thyromegal,y no JVD, no carotid bruit OROPHARYNX:  Moist, No exudate/ erythema/lesions.  Heart: Regular rate and rhythm, without murmurs, rubs, gallops, PMI non-displaced, no heaves or thrills on palpation.  Lungs: Clear to auscultation, no wheezing or rhonchi noted. No increased vocal fremitus resonant to percussion  Abdomen: Soft, nontender, nondistended, positive bowel sounds, no masses no hepatosplenomegaly noted..  Neuro: No focal neurological deficits noted cranial nerves II through XII grossly intact. DTRs 2+ bilaterally upper and lower extremities. Strength 5 out of 5 in bilateral upper and lower extremities. Musculoskeletal: No warm swelling or erythema around joints, no spinal tenderness noted. Psychiatric: Patient alert and oriented x3, good insight and cognition,  good recent to remote recall. Lymph node survey: No cervical axillary or inguinal lymphadenopathy noted.  Lab Results:  Basic Metabolic Panel:    Component Value Date/Time   NA 139 11/22/2020 0430   NA 137 08/02/2020 1538   K 3.8 11/22/2020 0430   CL 110 11/22/2020 0430   CO2 23 11/22/2020 0430   BUN 8 11/22/2020 0430   BUN 7 08/02/2020 1538   CREATININE 0.72 11/22/2020 0430   GLUCOSE 97 11/22/2020 0430   CALCIUM 8.4 (L) 11/22/2020 0430   CBC:    Component Value Date/Time   WBC 15.2 (H) 11/22/2020 0430   HGB 7.0 (L) 11/22/2020 0430   HGB 7.8 (L) 08/02/2020 1538   HCT 20.7 (L) 11/22/2020 0430   HCT 22.3 (L) 08/02/2020 1538   PLT 589 (H) 11/22/2020 0430   PLT 560 (H) 08/02/2020 1538   MCV 88.1 11/22/2020 0430   MCV 92 08/02/2020 1538   NEUTROABS 8.3 (H) 11/22/2020 0430   NEUTROABS 9.5 (H) 08/02/2020 1538   LYMPHSABS 4.3 (H) 11/22/2020 0430   LYMPHSABS 3.0 08/02/2020 1538   MONOABS 2.0 (H) 11/22/2020 0430   EOSABS 0.4 11/22/2020 0430   EOSABS 0.4 08/02/2020 1538   BASOSABS 0.1 11/22/2020 0430   BASOSABS 0.1 08/02/2020 1538    Recent Results (from the past 240 hour(s))  Resp Panel by RT-PCR (Flu A&B, Covid) Nasopharyngeal Swab     Status: None   Collection Time: 11/21/20  4:46 PM   Specimen: Nasopharyngeal Swab; Nasopharyngeal(NP) swabs in vial transport medium  Result Value Ref Range Status   SARS Coronavirus  2 by RT PCR NEGATIVE NEGATIVE Final    Comment: (NOTE) SARS-CoV-2 target nucleic acids are NOT DETECTED.  The SARS-CoV-2 RNA is generally detectable in upper respiratory specimens during the acute phase of infection. The lowest concentration of SARS-CoV-2 viral copies this assay can detect is 138 copies/mL. A negative result does not preclude SARS-Cov-2 infection and should not be used as the sole basis for treatment or other patient management decisions. A negative result may occur with  improper specimen collection/handling, submission of specimen  other than nasopharyngeal swab, presence of viral mutation(s) within the areas targeted by this assay, and inadequate number of viral copies(<138 copies/mL). A negative result must be combined with clinical observations, patient history, and epidemiological information. The expected result is Negative.  Fact Sheet for Patients:  EntrepreneurPulse.com.au  Fact Sheet for Healthcare Providers:  IncredibleEmployment.be  This test is no t yet approved or cleared by the [place] FDA and  has been authorized for detection and/or diagnosis of SARS-CoV-2 by FDA under an Emergency Use Authorization (EUA). This EUA will remain  in effect (meaning this test can be used) for the duration of the COVID-19 declaration under Section 564(b)(1) of the Act, 21 U.S.C.section 360bbb-3(b)(1), unless the authorization is terminated  or revoked sooner.       Influenza A by PCR NEGATIVE NEGATIVE Final   Influenza B by PCR NEGATIVE NEGATIVE Final    Comment: (NOTE) The Xpert Xpress SARS-CoV-2/FLU/RSV plus assay is intended as an aid in the diagnosis of influenza from Nasopharyngeal swab specimens and should not be used as a sole basis for treatment. Nasal washings and aspirates are unacceptable for Xpert Xpress SARS-CoV-2/FLU/RSV testing.  Fact Sheet for Patients: EntrepreneurPulse.com.au  Fact Sheet for Healthcare Providers: IncredibleEmployment.be  This test is not yet approved or cleared by the [place] FDA and has been authorized for detection and/or diagnosis of SARS-CoV-2 by FDA under an Emergency Use Authorization (EUA). This EUA will remain in effect (meaning this test can be used) for the duration of the COVID-19 declaration under Section 564(b)(1) of the Act, 21 U.S.C. section 360bbb-3(b)(1), unless the authorization is terminated or revoked.  Performed at [facility], [place] [address], [place], [place] [address]   Culture, blood (routine x 2)     Status: None (Preliminary result)   Collection Time: 11/21/20  7:06 PM   Specimen: BLOOD  Result Value Ref Range Status   Specimen Description   Final    BLOOD LEFT CHEST Performed at [facility] [address], [place], [place] [address]    Special Requests   Final    BOTTLES DRAWN AEROBIC AND ANAEROBIC Blood Culture adequate volume Performed at [facility] [address], [place], [place] [address]    Culture   Final    NO GROWTH < 12 HOURS Performed at [facility] [address], [place], [place] [address]    Report Status PENDING  Incomplete  Culture, blood (routine x 2)     Status: None (Preliminary result)   Collection Time: 11/21/20  7:11 PM   Specimen: BLOOD  Result Value Ref Range Status   Specimen Description   Final    BLOOD LEFT CHEST Performed at [facility] [address] [place], [place] [address]    Special Requests   Final    BOTTLES DRAWN AEROBIC AND ANAEROBIC Blood Culture adequate volume Performed at [facility] [address] [place], [place] [address]  Culture   Final    NO GROWTH < 12 HOURS Performed at [facility], [place] [address], [place], [place] [address]    Report Status PENDING  Incomplete    Studies/Results: No results found.  Medications: Scheduled Meds: . cholecalciferol  1,000 Units Oral Daily  . Deferasirox  1,080 mg Oral QAC breakfast  . famotidine  20 mg Oral Q12H  . folic acid  1 mg Oral Daily  . HYDROmorphone   Intravenous Q4H  . ketorolac  15 mg Intravenous Q6H  . mirtazapine  45 mg Oral QHS  . mometasone-formoterol  2 puff Inhalation BID  . multivitamin with minerals  1 tablet Oral Daily  . rivaroxaban  20 mg Oral QPC supper  . senna-docusate  1 tablet Oral BID  . vitamin B-12  1,000 mcg Oral Daily  . voxelotor  1,500 mg Oral Daily   Continuous Infusions: .  dextrose 5 % and 0.45% NaCl 125 mL/hr at 11/22/20 0239  . diphenhydrAMINE 25 mg (11/22/20 1240)  . famotidine (PEPCID) IV Stopped (11/22/20 0144)   PRN Meds:.albuterol, diphenhydrAMINE **OR** diphenhydrAMINE, gabapentin, HYDROmorphone (DILAUDID) injection, naloxone **AND** sodium chloride flush, ondansetron **OR** ondansetron (ZOFRAN) IV, polyethylene glycol, promethazine, senna-docusate  Consultants:  None  Procedures:  None  Antibiotics:  None  Assessment/Plan: Principal Problem:   Sickle cell anemia with pain (HCC) Active Problems:   Leukocytosis   Chest pain   Hx of pulmonary embolism (HCC)   Chronic pain syndrome  Sickle cell disease with pain crisis: Continue IV fluids to KVO Continue IV Dilaudid PCA, no changes in settings Toradol 15 mg IV every 6 hours Monitor vital signs very closely, reevaluate pain scale regularly, and supplemental oxygen as needed  Sickle cell anemia: Hemoglobin is stable and consistent with patient's baseline which is 7-8 g/dL.  She is typically not transfused unless hemoglobin is less than 6.  Continue to follow labs in AM.  Mild intermittent asthma: Stable.  Continue home medications  History of depression and anxiety Continue home meds.  Patient denies any suicidal homicidal intent  Chronic pain syndrome: Continue home medications  History of pulmonary embolism: Continue Xarelto  Leukocytosis: WBCs 15.2.  No signs of active infection.  More than likely secondary to vaso-occlusive crisis.  Continue to follow closely.  CBC in AM.  Code Status: Full Code Family Communication: N/A Disposition Plan: Not yet ready for discharge  [place], MSN, FNP-C [facility] [address] [place], [place] [address] [phone]  If 5PM-8AM, please contact night-coverage.  11/22/2020, 3:18 PM  LOS: 1 day

## 2020-11-23 DIAGNOSIS — D5701 Hb-SS disease with acute chest syndrome: Secondary | ICD-10-CM | POA: Diagnosis not present

## 2020-11-23 DIAGNOSIS — D5700 Hb-SS disease with crisis, unspecified: Secondary | ICD-10-CM | POA: Diagnosis not present

## 2020-11-23 MED ORDER — HYDROMORPHONE 1 MG/ML IV SOLN
INTRAVENOUS | Status: DC
  Administered 2020-11-23 (×2): 3.5 mg via INTRAVENOUS
  Administered 2020-11-23 – 2020-11-24 (×2): 30 mg via INTRAVENOUS
  Administered 2020-11-24 (×2): 1 mg via INTRAVENOUS
  Administered 2020-11-24: 5.5 mg via INTRAVENOUS
  Administered 2020-11-24: 3.5 mg via INTRAVENOUS
  Administered 2020-11-25: 5 mg via INTRAVENOUS
  Administered 2020-11-25: 2 mg via INTRAVENOUS
  Administered 2020-11-25: 10 mg via INTRAVENOUS
  Administered 2020-11-25 (×2): 4 mg via INTRAVENOUS
  Administered 2020-11-25: 2 mL via INTRAVENOUS
  Administered 2020-11-26: 6 mg via INTRAVENOUS
  Administered 2020-11-26: 1 mg via INTRAVENOUS
  Administered 2020-11-26: 30 mg via INTRAVENOUS
  Administered 2020-11-26: 2 mg via INTRAVENOUS
  Filled 2020-11-23 (×3): qty 30

## 2020-11-23 NOTE — Progress Notes (Signed)
Subjective: Molly Gutierrez is a 40 year old female with a medical history significant for sickle cell disease, chronic pain syndrome, opiate dependence and tolerance, history of mild intermittent asthma, and history of PE on anticoagulation was admitted for sickle cell pain crisis. Patient continues to complain of pain primarily to upper back.  She rates her pain as 7/10, which is somewhat improved from yesterday.  She denies any headache, chest pain, dizziness, shortness of breath, urinary symptoms, nausea, vomiting, or diarrhea.  Objective:  Vital signs in last 24 hours:  Vitals:   11/23/20 0901 11/23/20 0947 11/23/20 1014 11/23/20 1143  BP:   111/72   Pulse:   (!) 107   Resp:  13 14 13   Temp:   99.4 F (37.4 C)   TempSrc:   Oral   SpO2: 93%  95% 95%  Weight:      Height:        Intake/Output from previous day:   Intake/Output Summary (Last 24 hours) at 11/23/2020 1157 Last data filed at 11/23/2020 1014 Gross per 24 hour  Intake 2520 ml  Output 1250 ml  Net 1270 ml    Physical Exam: General: Alert, awake, oriented x3, in no acute distress.  HEENT: [place]/AT PEERL, EOMI Neck: Trachea midline,  no masses, no thyromegal,y no JVD, no carotid bruit OROPHARYNX:  Moist, No exudate/ erythema/lesions.  Heart: Regular rate and rhythm, without murmurs, rubs, gallops, PMI non-displaced, no heaves or thrills on palpation.  Lungs: Clear to auscultation, no wheezing or rhonchi noted. No increased vocal fremitus resonant to percussion  Abdomen: Soft, nontender, nondistended, positive bowel sounds, no masses no hepatosplenomegaly noted..  Neuro: No focal neurological deficits noted cranial nerves II through XII grossly intact. DTRs 2+ bilaterally upper and lower extremities. Strength 5 out of 5 in bilateral upper and lower extremities. Musculoskeletal: No warm swelling or erythema around joints, no spinal tenderness noted. Psychiatric: Patient alert and oriented x3, good insight and  cognition, good recent to remote recall. Lymph node survey: No cervical axillary or inguinal lymphadenopathy noted.  Lab Results:  Basic Metabolic Panel:    Component Value Date/Time   NA 139 11/22/2020 0430   NA 137 08/02/2020 1538   K 3.8 11/22/2020 0430   CL 110 11/22/2020 0430   CO2 23 11/22/2020 0430   BUN 8 11/22/2020 0430   BUN 7 08/02/2020 1538   CREATININE 0.72 11/22/2020 0430   GLUCOSE 97 11/22/2020 0430   CALCIUM 8.4 (L) 11/22/2020 0430   CBC:    Component Value Date/Time   WBC 15.2 (H) 11/22/2020 0430   HGB 7.0 (L) 11/22/2020 0430   HGB 7.8 (L) 08/02/2020 1538   HCT 20.7 (L) 11/22/2020 0430   HCT 22.3 (L) 08/02/2020 1538   PLT 589 (H) 11/22/2020 0430   PLT 560 (H) 08/02/2020 1538   MCV 88.1 11/22/2020 0430   MCV 92 08/02/2020 1538   NEUTROABS 8.3 (H) 11/22/2020 0430   NEUTROABS 9.5 (H) 08/02/2020 1538   LYMPHSABS 4.3 (H) 11/22/2020 0430   LYMPHSABS 3.0 08/02/2020 1538   MONOABS 2.0 (H) 11/22/2020 0430   EOSABS 0.4 11/22/2020 0430   EOSABS 0.4 08/02/2020 1538   BASOSABS 0.1 11/22/2020 0430   BASOSABS 0.1 08/02/2020 1538    Recent Results (from the past 240 hour(s))  Resp Panel by RT-PCR (Flu A&B, Covid) Nasopharyngeal Swab     Status: None   Collection Time: 11/21/20  4:46 PM   Specimen: Nasopharyngeal Swab; Nasopharyngeal(NP) swabs in vial transport medium  Result Value Ref  Range Status   SARS Coronavirus 2 by RT PCR NEGATIVE NEGATIVE Final    Comment: (NOTE) SARS-CoV-2 target nucleic acids are NOT DETECTED.  The SARS-CoV-2 RNA is generally detectable in upper respiratory specimens during the acute phase of infection. The lowest concentration of SARS-CoV-2 viral copies this assay can detect is 138 copies/mL. A negative result does not preclude SARS-Cov-2 infection and should not be used as the sole basis for treatment or other patient management decisions. A negative result may occur with  improper specimen collection/handling, submission of  specimen other than nasopharyngeal swab, presence of viral mutation(s) within the areas targeted by this assay, and inadequate number of viral copies(<138 copies/mL). A negative result must be combined with clinical observations, patient history, and epidemiological information. The expected result is Negative.  Fact Sheet for Patients:  EntrepreneurPulse.com.au  Fact Sheet for Healthcare Providers:  IncredibleEmployment.be  This test is no t yet approved or cleared by the [place] FDA and  has been authorized for detection and/or diagnosis of SARS-CoV-2 by FDA under an Emergency Use Authorization (EUA). This EUA will remain  in effect (meaning this test can be used) for the duration of the COVID-19 declaration under Section 564(b)(1) of the Act, 21 U.S.C.section 360bbb-3(b)(1), unless the authorization is terminated  or revoked sooner.       Influenza A by PCR NEGATIVE NEGATIVE Final   Influenza B by PCR NEGATIVE NEGATIVE Final    Comment: (NOTE) The Xpert Xpress SARS-CoV-2/FLU/RSV plus assay is intended as an aid in the diagnosis of influenza from Nasopharyngeal swab specimens and should not be used as a sole basis for treatment. Nasal washings and aspirates are unacceptable for Xpert Xpress SARS-CoV-2/FLU/RSV testing.  Fact Sheet for Patients: EntrepreneurPulse.com.au  Fact Sheet for Healthcare Providers: IncredibleEmployment.be  This test is not yet approved or cleared by the [place] FDA and has been authorized for detection and/or diagnosis of SARS-CoV-2 by FDA under an Emergency Use Authorization (EUA). This EUA will remain in effect (meaning this test can be used) for the duration of the COVID-19 declaration under Section 564(b)(1) of the Act, 21 U.S.C. section 360bbb-3(b)(1), unless the authorization is terminated or revoked.  Performed at [facility], [place]  [address] [place], [place] [address]   Culture, blood (routine x 2)     Status: None (Preliminary result)   Collection Time: 11/21/20  7:06 PM   Specimen: BLOOD  Result Value Ref Range Status   Specimen Description   Final    BLOOD LEFT CHEST Performed at [facility] [address], [place], [place] [address]    Special Requests   Final    BOTTLES DRAWN AEROBIC AND ANAEROBIC Blood Culture adequate volume Performed at [facility] [address] [place], [place] [address]    Culture   Final    NO GROWTH 2 DAYS Performed at [facility] [address] [place], [place] [address]    Report Status PENDING  Incomplete  Culture, blood (routine x 2)     Status: None (Preliminary result)   Collection Time: 11/21/20  7:11 PM   Specimen: BLOOD  Result Value Ref Range Status   Specimen Description   Final    BLOOD LEFT CHEST Performed at [facility] [address], [place], [place] [address]    Special Requests   Final    BOTTLES DRAWN AEROBIC AND ANAEROBIC Blood Culture adequate volume Performed at [facility]  [address], [place], [place] [address]    Culture   Final    NO GROWTH 2 DAYS Performed at [facility] [address] [place], [place] [address]    Report Status PENDING  Incomplete    Studies/Results: DG Chest 2 View  Result Date: 11/22/2020 CLINICAL DATA:  Sickle cell disease, acute chest syndrome EXAM: CHEST - 2 VIEW COMPARISON:  11/07/2020 FINDINGS: Bibasilar opacification is accentuated by overlying soft tissue and slight underpenetration on this examination. Despite this, patchy infiltrate is present within the lung bases bilaterally, new since prior examination. No pneumothorax or pleural effusion. Cardiac size is mildly enlarged. Bilateral internal jugular chest port are in place with tips at the superior cavoatrial junction and within the mid right atrium. The pulmonary  vascularity is normal. No acute bone abnormality. IMPRESSION: Interval development of mild patchy bibasilar pulmonary infiltrate. Stable cardiomegaly. Electronically Signed   By: Fidela Salisbury MD   On: 11/22/2020 20:01    Medications: Scheduled Meds:  Chlorhexidine Gluconate Cloth  6 each Topical Daily   cholecalciferol  1,000 Units Oral Daily   Deferasirox  1,080 mg Oral QAC breakfast   famotidine  20 mg Oral T65Y   folic acid  1 mg Oral Daily   HYDROmorphone   Intravenous Q4H   ketorolac  15 mg Intravenous Q6H   mirtazapine  45 mg Oral QHS   mometasone-formoterol  2 puff Inhalation BID   multivitamin with minerals  1 tablet Oral Daily   rivaroxaban  20 mg Oral QPC supper   senna-docusate  1 tablet Oral BID   vitamin B-12  1,000 mcg Oral Daily   voxelotor  1,500 mg Oral Daily   Continuous Infusions:  dextrose 5 % and 0.45% NaCl 125 mL/hr at 11/22/20 0239   diphenhydrAMINE 25 mg (11/23/20 0105)   famotidine (PEPCID) IV Stopped (11/22/20 0144)   promethazine (PHENERGAN) injection (IM or IVPB) 12.5 mg (11/23/20 0031)   PRN Meds:.albuterol, diphenhydrAMINE **OR** diphenhydrAMINE, gabapentin, naloxone **AND** sodium chloride flush, ondansetron **OR** ondansetron (ZOFRAN) IV, polyethylene glycol, promethazine (PHENERGAN) injection (IM or IVPB), promethazine, senna-docusate  Consultants: None  Procedures: None  Antibiotics: None  Assessment/Plan: Principal Problem:   Sickle cell pain crisis ([place]) Active Problems:   Leukocytosis   Chest pain   Hx of pulmonary embolism (HCC)   Sickle cell anemia with pain (HCC)   Chronic pain syndrome   Sickle cell disease with pain crisis: Decrease IV fluids to KVO Continue IV Dilaudid PCA, weaning, decreased settings today Toradol 15 mg IV every 6 hours for total of 5 days Monitor vital signs closely, reevaluate pain scale regularly, and supplemental oxygen as needed  Sickle cell anemia: Hemoglobin remained stable and consistent with  patient's baseline.  We will decrease IV fluids today.  Baseline is around 7-8 grams per deciliter.  She is typically not transfused unless hemoglobin is less than 6.0 g/dL.  Follow labs in AM.  Mild intermittent asthma: Stable.  Continue home medications  History of depression and anxiety: Continue home meds.  Today, patient denies any suicidal homicidal intent.  Chronic pain syndrome: Continue home medications  History of pulmonary embolism: Continue Xarelto  Leukocytosis: Stable.  No signs of active infection.  More than likely secondary to vaso-occlusive pain crisis.  Continue to monitor closely without antibiotics.  CBC in a.m.  Code Status: Full Code Family Communication: N/A Disposition Plan: Not yet ready for discharge  Nazeer Romney [place]  APRN, MSN, FNP-C [facility]  [place], [place] [address] [phone]  If 5PM-8AM, please contact night-coverage.  11/23/2020, 11:57 AM  LOS: 2 days

## 2020-11-23 NOTE — Plan of Care (Signed)

## 2020-11-24 DIAGNOSIS — D5700 Hb-SS disease with crisis, unspecified: Secondary | ICD-10-CM | POA: Diagnosis not present

## 2020-11-24 DIAGNOSIS — D5701 Hb-SS disease with acute chest syndrome: Secondary | ICD-10-CM | POA: Diagnosis not present

## 2020-11-24 LAB — CBC
HCT: 19.2 % — ABNORMAL LOW (ref 36.0–46.0)
Hemoglobin: 6.5 g/dL — CL (ref 12.0–15.0)
MCH: 29.5 pg (ref 26.0–34.0)
MCHC: 33.9 g/dL (ref 30.0–36.0)
MCV: 87.3 fL (ref 80.0–100.0)
Platelets: 558 10*3/uL — ABNORMAL HIGH (ref 150–400)
RBC: 2.2 MIL/uL — ABNORMAL LOW (ref 3.87–5.11)
RDW: 21.2 % — ABNORMAL HIGH (ref 11.5–15.5)
WBC: 19.1 10*3/uL — ABNORMAL HIGH (ref 4.0–10.5)
nRBC: 0.3 % — ABNORMAL HIGH (ref 0.0–0.2)

## 2020-11-24 NOTE — Care Management Important Message (Signed)
Important Message  Patient Details IM Letter given to the Patient. Name: Molly Gutierrez MRN: [id number] Date of Birth: 1981-02-18   Medicare Important Message Given:  Yes     Kerin Salen 11/24/2020, 11:01 AM

## 2020-11-24 NOTE — Progress Notes (Addendum)
Subjective: Molly Gutierrez is a 40 year old female with a medical history significant for sickle cell disease chronic pain syndrome, opiate dependence and tolerance, history of mild intermittent asthma, and history of PE on anticoagulation was admitted for sickle cell pain crisis.  Patient has no new complaints on today.  She continues to have pain primarily to low back.  She rates her pain as 7/10.  She denies any headache, chest pain, urinary symptoms, nausea, vomiting, or diarrhea.  Objective:  Vital signs in last 24 hours:  Vitals:   11/24/20 0624 11/24/20 0736 11/24/20 0827 11/24/20 0958  BP:    102/70  Pulse:    95  Resp: 16 16  13   Temp:    98.8 F (37.1 C)  TempSrc:    Oral  SpO2: 97% 98% 97% 98%  Weight:      Height:        Intake/Output from previous day:   Intake/Output Summary (Last 24 hours) at 11/24/2020 1232 Last data filed at 11/23/2020 1817 Gross per 24 hour  Intake 480 ml  Output --  Net 480 ml    Physical Exam: General: Alert, awake, oriented x3, in no acute distress.  HEENT: [place]/AT PEERL, EOMI Neck: Trachea midline,  no masses, no thyromegal,y no JVD, no carotid bruit OROPHARYNX:  Moist, No exudate/ erythema/lesions.  Heart: Regular rate and rhythm, without murmurs, rubs, gallops, PMI non-displaced, no heaves or thrills on palpation.  Lungs: Clear to auscultation, no wheezing or rhonchi noted. No increased vocal fremitus resonant to percussion  Abdomen: Soft, nontender, nondistended, positive bowel sounds, no masses no hepatosplenomegaly noted..  Neuro: No focal neurological deficits noted cranial nerves II through XII grossly intact. DTRs 2+ bilaterally upper and lower extremities. Strength 5 out of 5 in bilateral upper and lower extremities. Musculoskeletal: No warm swelling or erythema around joints, no spinal tenderness noted. Psychiatric: Patient alert and oriented x3, good insight and cognition, good recent to remote recall. Lymph node survey:  No cervical axillary or inguinal lymphadenopathy noted.  Lab Results:  Basic Metabolic Panel:    Component Value Date/Time   NA 139 11/22/2020 0430   NA 137 08/02/2020 1538   K 3.8 11/22/2020 0430   CL 110 11/22/2020 0430   CO2 23 11/22/2020 0430   BUN 8 11/22/2020 0430   BUN 7 08/02/2020 1538   CREATININE 0.72 11/22/2020 0430   GLUCOSE 97 11/22/2020 0430   CALCIUM 8.4 (L) 11/22/2020 0430   CBC:    Component Value Date/Time   WBC 19.1 (H) 11/24/2020 0551   HGB 6.5 (LL) 11/24/2020 0551   HGB 7.8 (L) 08/02/2020 1538   HCT 19.2 (L) 11/24/2020 0551   HCT 22.3 (L) 08/02/2020 1538   PLT 558 (H) 11/24/2020 0551   PLT 560 (H) 08/02/2020 1538   MCV 87.3 11/24/2020 0551   MCV 92 08/02/2020 1538   NEUTROABS 8.3 (H) 11/22/2020 0430   NEUTROABS 9.5 (H) 08/02/2020 1538   LYMPHSABS 4.3 (H) 11/22/2020 0430   LYMPHSABS 3.0 08/02/2020 1538   MONOABS 2.0 (H) 11/22/2020 0430   EOSABS 0.4 11/22/2020 0430   EOSABS 0.4 08/02/2020 1538   BASOSABS 0.1 11/22/2020 0430   BASOSABS 0.1 08/02/2020 1538    Recent Results (from the past 240 hour(s))  Resp Panel by RT-PCR (Flu A&B, Covid) Nasopharyngeal Swab     Status: None   Collection Time: 11/21/20  4:46 PM   Specimen: Nasopharyngeal Swab; Nasopharyngeal(NP) swabs in vial transport medium  Result Value Ref Range Status  SARS Coronavirus 2 by RT PCR NEGATIVE NEGATIVE Final    Comment: (NOTE) SARS-CoV-2 target nucleic acids are NOT DETECTED.  The SARS-CoV-2 RNA is generally detectable in upper respiratory specimens during the acute phase of infection. The lowest concentration of SARS-CoV-2 viral copies this assay can detect is 138 copies/mL. A negative result does not preclude SARS-Cov-2 infection and should not be used as the sole basis for treatment or other patient management decisions. A negative result may occur with  improper specimen collection/handling, submission of specimen other than nasopharyngeal swab, presence of viral  mutation(s) within the areas targeted by this assay, and inadequate number of viral copies(<138 copies/mL). A negative result must be combined with clinical observations, patient history, and epidemiological information. The expected result is Negative.  Fact Sheet for Patients:  EntrepreneurPulse.com.au  Fact Sheet for Healthcare Providers:  IncredibleEmployment.be  This test is no t yet approved or cleared by the [place] FDA and  has been authorized for detection and/or diagnosis of SARS-CoV-2 by FDA under an Emergency Use Authorization (EUA). This EUA will remain  in effect (meaning this test can be used) for the duration of the COVID-19 declaration under Section 564(b)(1) of the Act, 21 U.S.C.section 360bbb-3(b)(1), unless the authorization is terminated  or revoked sooner.       Influenza A by PCR NEGATIVE NEGATIVE Final   Influenza B by PCR NEGATIVE NEGATIVE Final    Comment: (NOTE) The Xpert Xpress SARS-CoV-2/FLU/RSV plus assay is intended as an aid in the diagnosis of influenza from Nasopharyngeal swab specimens and should not be used as a sole basis for treatment. Nasal washings and aspirates are unacceptable for Xpert Xpress SARS-CoV-2/FLU/RSV testing.  Fact Sheet for Patients: EntrepreneurPulse.com.au  Fact Sheet for Healthcare Providers: IncredibleEmployment.be  This test is not yet approved or cleared by the [place] FDA and has been authorized for detection and/or diagnosis of SARS-CoV-2 by FDA under an Emergency Use Authorization (EUA). This EUA will remain in effect (meaning this test can be used) for the duration of the COVID-19 declaration under Section 564(b)(1) of the Act, 21 U.S.C. section 360bbb-3(b)(1), unless the authorization is terminated or revoked.  Performed at [facility], [place] [address] [place], [place] [address]   Culture, blood (routine  x 2)     Status: None (Preliminary result)   Collection Time: 11/21/20  7:06 PM   Specimen: BLOOD  Result Value Ref Range Status   Specimen Description   Final    BLOOD LEFT CHEST Performed at [facility] [address] [place], [place] [address]    Special Requests   Final    BOTTLES DRAWN AEROBIC AND ANAEROBIC Blood Culture adequate volume Performed at [facility] [address], [place], [place] [address]    Culture   Final    NO GROWTH 3 DAYS Performed at [facility], [place] [address], [place], [place] [address]    Report Status PENDING  Incomplete  Culture, blood (routine x 2)     Status: None (Preliminary result)   Collection Time: 11/21/20  7:11 PM   Specimen: BLOOD  Result Value Ref Range Status   Specimen Description   Final    BLOOD LEFT CHEST Performed at [facility] [address] [place], [place] [address]    Special Requests   Final    BOTTLES DRAWN AEROBIC AND ANAEROBIC Blood Culture adequate volume Performed at [facility] [address] [place], [place]  [address]    Culture   Final    NO GROWTH 3 DAYS Performed at [facility], [place] [address], [place], [place] [address]    Report Status PENDING  Incomplete    Studies/Results: DG Chest 2 View  Result Date: 11/22/2020 CLINICAL DATA:  Sickle cell disease, acute chest syndrome EXAM: CHEST - 2 VIEW COMPARISON:  11/07/2020 FINDINGS: Bibasilar opacification is accentuated by overlying soft tissue and slight underpenetration on this examination. Despite this, patchy infiltrate is present within the lung bases bilaterally, new since prior examination. No pneumothorax or pleural effusion. Cardiac size is mildly enlarged. Bilateral internal jugular chest port are in place with tips at the superior cavoatrial junction and within the mid right atrium. The pulmonary vascularity is normal. No acute bone abnormality. IMPRESSION:  Interval development of mild patchy bibasilar pulmonary infiltrate. Stable cardiomegaly. Electronically Signed   By: Fidela Salisbury MD   On: 11/22/2020 20:01    Medications: Scheduled Meds:  Chlorhexidine Gluconate Cloth  6 each Topical Daily   cholecalciferol  1,000 Units Oral Daily   Deferasirox  1,080 mg Oral QAC breakfast   famotidine  20 mg Oral E08X   folic acid  1 mg Oral Daily   HYDROmorphone   Intravenous Q4H   ketorolac  15 mg Intravenous Q6H   mirtazapine  45 mg Oral QHS   mometasone-formoterol  2 puff Inhalation BID   multivitamin with minerals  1 tablet Oral Daily   rivaroxaban  20 mg Oral QPC supper   senna-docusate  1 tablet Oral BID   vitamin B-12  1,000 mcg Oral Daily   voxelotor  1,500 mg Oral Daily   Continuous Infusions:  dextrose 5 % and 0.45% NaCl 20 mL/hr at 11/23/20 1615   diphenhydrAMINE 25 mg (11/24/20 0611)   PRN Meds:.albuterol, diphenhydrAMINE **OR** diphenhydrAMINE, gabapentin, naloxone **AND** sodium chloride flush, ondansetron **OR** ondansetron (ZOFRAN) IV, polyethylene glycol, promethazine, senna-docusate  Consultants: None  Procedures: None  Antibiotics: None  Assessment/Plan: Principal Problem:   Sickle cell pain crisis ([place]) Active Problems:   Leukocytosis   Chest pain   Hx of pulmonary embolism (HCC)   Sickle cell anemia with pain (HCC)   Chronic pain syndrome  Sickle cell disease with pain: Continue IV Dilaudid PCA, weaning in settings decreased Toradol 15 mg IV every 6 hours for total of 5 days Monitor vital signs very closely, reevaluate pain scale regularly, and supplemental oxygen as needed  Sickle cell anemia: Today, patient's hemoglobin is 6.8, which is below her baseline.  We will continue to follow closely.  Repeat CBC in a.m., if hemoglobin is less than 6.0 g/dL, transfuse 1 unit PRBCs.  History of mild intermittent asthma: Stable.  Continue home medications chronic pain syndrome: Hold oral Dilaudid, continue to use  PCA.  We will introduce oral medication as pain continues to improve.  History of pulmonary embolism: Continue Xarelto  Leukocytosis: Stable.  No signs of active infection.  More than likely secondary to vaso-occlusive pain crisis.  Continue to monitor closely.  Labs in the morning.    History of depression and anxiety: Stable.  Continue home medications.  Patient denies any suicidal or homicidal  Code Status: Full Code Family Communication: N/A Disposition Plan: Not yet ready for discharge    [place], MSN, FNP-C [facility] [place], [place] [address] [phone]  If 5PM-8AM, please contact night-coverage.  11/24/2020, 12:32 PM  LOS: 3 days

## 2020-11-24 NOTE — Progress Notes (Signed)
Date and time results received: 11/24/20 0634  Test: HGB Critical Value: 6.5  Name of Provider Notified: Mansy. MD  Orders Received? Or Actions Taken?: Make day MD aware

## 2020-11-24 NOTE — Progress Notes (Signed)
The patient is receiving Pepcid by the intravenous route.  Based on criteria approved by the Pharmacy and [place], the medication is being converted to the equivalent oral dose form.  These criteria include: -No active GI bleeding -Able to tolerate diet of full liquids (or better) or tube feeding -Able to tolerate other medications by the oral or enteral route  If you have any questions about this conversion, please contact the Pharmacy Department (phone [phone]).  Thank you.   Peggyann Juba, PharmD, BCPS 11/24/2020 .now

## 2020-11-25 DIAGNOSIS — I2699 Other pulmonary embolism without acute cor pulmonale: Secondary | ICD-10-CM | POA: Diagnosis not present

## 2020-11-25 DIAGNOSIS — D72829 Elevated white blood cell count, unspecified: Secondary | ICD-10-CM | POA: Diagnosis not present

## 2020-11-25 DIAGNOSIS — G894 Chronic pain syndrome: Secondary | ICD-10-CM

## 2020-11-25 DIAGNOSIS — D5700 Hb-SS disease with crisis, unspecified: Secondary | ICD-10-CM | POA: Diagnosis not present

## 2020-11-25 DIAGNOSIS — D5701 Hb-SS disease with acute chest syndrome: Secondary | ICD-10-CM | POA: Diagnosis not present

## 2020-11-25 LAB — HEMOGLOBIN AND HEMATOCRIT, BLOOD
HCT: 19.3 % — ABNORMAL LOW (ref 36.0–46.0)
Hemoglobin: 6.5 g/dL — CL (ref 12.0–15.0)

## 2020-11-25 LAB — PREPARE RBC (CROSSMATCH)

## 2020-11-25 MED ORDER — SODIUM CHLORIDE 0.9% IV SOLUTION: Freq: Once | INTRAVENOUS | Status: AC

## 2020-11-25 NOTE — Progress Notes (Signed)
Patient ID: Molly Gutierrez, female   DOB: July 01, 1980, 40 y.o.   MRN: [id number] Subjective: Molly Gutierrez is a 40 year old female with a medical history significant for sickle cell disease, chronic pain syndrome, opiate dependence and tolerance, history of mild intermittent asthma, and history of PE on anticoagulation was admitted for sickle cell pain crisis.  Patient has no new complaint today.  She continues to significant pain related to her sickle cell disease, localized to her upper and lower back and lower extremities.  She rates her pain at 7/10 this morning.  She denies any aggravating factor, slightly relieved with pain medications.  She denies any headache, cough, chest pain, shortness of breath, dizziness, nausea, vomiting or diarrhea.  She denies any urinary symptoms.  Objective:  Vital signs in last 24 hours:  Vitals:   11/25/20 0542 11/25/20 0700 11/25/20 0839 11/25/20 1002  BP: 114/77   128/85  Pulse: 87   91  Resp: 14   17  Temp: 98.6 F (37 C)   98.9 F (37.2 C)  TempSrc: Oral   Oral  SpO2: 93% 97% 96% 98%  Weight:      Height:        Intake/Output from previous day:  No intake or output data in the 24 hours ending 11/25/20 1334  Physical Exam: General: Alert, awake, oriented x3, in no acute distress.  HEENT: [place]/AT PEERL, EOMI Neck: Trachea midline,  no masses, no thyromegal,y no JVD, no carotid bruit OROPHARYNX:  Moist, No exudate/ erythema/lesions.  Heart: Regular rate and rhythm, without murmurs, rubs, gallops, PMI non-displaced, no heaves or thrills on palpation.  Lungs: Clear to auscultation, no wheezing or rhonchi noted. No increased vocal fremitus resonant to percussion  Abdomen: Soft, nontender, nondistended, positive bowel sounds, no masses no hepatosplenomegaly noted..  Neuro: No focal neurological deficits noted cranial nerves II through XII grossly intact. DTRs 2+ bilaterally upper and lower extremities. Strength 5 out of 5 in bilateral  upper and lower extremities. Musculoskeletal: No warm swelling or erythema around joints, no spinal tenderness noted. Psychiatric: Patient alert and oriented x3, good insight and cognition, good recent to remote recall. Lymph node survey: No cervical axillary or inguinal lymphadenopathy noted.  Lab Results:  Basic Metabolic Panel:    Component Value Date/Time   NA 139 11/22/2020 0430   NA 137 08/02/2020 1538   K 3.8 11/22/2020 0430   CL 110 11/22/2020 0430   CO2 23 11/22/2020 0430   BUN 8 11/22/2020 0430   BUN 7 08/02/2020 1538   CREATININE 0.72 11/22/2020 0430   GLUCOSE 97 11/22/2020 0430   CALCIUM 8.4 (L) 11/22/2020 0430   CBC:    Component Value Date/Time   WBC 19.1 (H) 11/24/2020 0551   HGB 6.5 (LL) 11/24/2020 0551   HGB 7.8 (L) 08/02/2020 1538   HCT 19.2 (L) 11/24/2020 0551   HCT 22.3 (L) 08/02/2020 1538   PLT 558 (H) 11/24/2020 0551   PLT 560 (H) 08/02/2020 1538   MCV 87.3 11/24/2020 0551   MCV 92 08/02/2020 1538   NEUTROABS 8.3 (H) 11/22/2020 0430   NEUTROABS 9.5 (H) 08/02/2020 1538   LYMPHSABS 4.3 (H) 11/22/2020 0430   LYMPHSABS 3.0 08/02/2020 1538   MONOABS 2.0 (H) 11/22/2020 0430   EOSABS 0.4 11/22/2020 0430   EOSABS 0.4 08/02/2020 1538   BASOSABS 0.1 11/22/2020 0430   BASOSABS 0.1 08/02/2020 1538    Recent Results (from the past 240 hour(s))  Resp Panel by RT-PCR (Flu A&B, Covid) Nasopharyngeal Swab  Status: None   Collection Time: 11/21/20  4:46 PM   Specimen: Nasopharyngeal Swab; Nasopharyngeal(NP) swabs in vial transport medium  Result Value Ref Range Status   SARS Coronavirus 2 by RT PCR NEGATIVE NEGATIVE Final    Comment: (NOTE) SARS-CoV-2 target nucleic acids are NOT DETECTED.  The SARS-CoV-2 RNA is generally detectable in upper respiratory specimens during the acute phase of infection. The lowest concentration of SARS-CoV-2 viral copies this assay can detect is 138 copies/mL. A negative result does not preclude SARS-Cov-2 infection and  should not be used as the sole basis for treatment or other patient management decisions. A negative result may occur with  improper specimen collection/handling, submission of specimen other than nasopharyngeal swab, presence of viral mutation(s) within the areas targeted by this assay, and inadequate number of viral copies(<138 copies/mL). A negative result must be combined with clinical observations, patient history, and epidemiological information. The expected result is Negative.  Fact Sheet for Patients:  EntrepreneurPulse.com.au  Fact Sheet for Healthcare Providers:  IncredibleEmployment.be  This test is no t yet approved or cleared by the [place] FDA and  has been authorized for detection and/or diagnosis of SARS-CoV-2 by FDA under an Emergency Use Authorization (EUA). This EUA will remain  in effect (meaning this test can be used) for the duration of the COVID-19 declaration under Section 564(b)(1) of the Act, 21 U.S.C.section 360bbb-3(b)(1), unless the authorization is terminated  or revoked sooner.       Influenza A by PCR NEGATIVE NEGATIVE Final   Influenza B by PCR NEGATIVE NEGATIVE Final    Comment: (NOTE) The Xpert Xpress SARS-CoV-2/FLU/RSV plus assay is intended as an aid in the diagnosis of influenza from Nasopharyngeal swab specimens and should not be used as a sole basis for treatment. Nasal washings and aspirates are unacceptable for Xpert Xpress SARS-CoV-2/FLU/RSV testing.  Fact Sheet for Patients: EntrepreneurPulse.com.au  Fact Sheet for Healthcare Providers: IncredibleEmployment.be  This test is not yet approved or cleared by the [place] FDA and has been authorized for detection and/or diagnosis of SARS-CoV-2 by FDA under an Emergency Use Authorization (EUA). This EUA will remain in effect (meaning this test can be used) for the duration of the COVID-19 declaration  under Section 564(b)(1) of the Act, 21 U.S.C. section 360bbb-3(b)(1), unless the authorization is terminated or revoked.  Performed at [facility], [place] [address] [place], [place] [address]   Culture, blood (routine x 2)     Status: None (Preliminary result)   Collection Time: 11/21/20  7:06 PM   Specimen: BLOOD  Result Value Ref Range Status   Specimen Description   Final    BLOOD LEFT CHEST Performed at [facility] [address], [place], [place] [address]    Special Requests   Final    BOTTLES DRAWN AEROBIC AND ANAEROBIC Blood Culture adequate volume Performed at [facility] [address], [place], [place] [address]    Culture   Final    NO GROWTH 4 DAYS Performed at [facility], [place] [address], [place], [place] [address]    Report Status PENDING  Incomplete  Culture, blood (routine x 2)     Status: None (Preliminary result)   Collection Time: 11/21/20  7:11 PM   Specimen: BLOOD  Result Value Ref Range Status   Specimen Description   Final    BLOOD LEFT CHEST Performed at [facility] [address] [place], [place] [address]  Special Requests   Final    BOTTLES DRAWN AEROBIC AND ANAEROBIC Blood Culture adequate volume Performed at [facility] [address], [place], [place] [address]    Culture   Final    NO GROWTH 4 DAYS Performed at [facility], [place] [address] [place], [place] [address]    Report Status PENDING  Incomplete    Studies/Results: No results found.  Medications: Scheduled Meds:  Chlorhexidine Gluconate Cloth  6 each Topical Daily   cholecalciferol  1,000 Units Oral Daily   Deferasirox  1,080 mg Oral QAC breakfast   famotidine  20 mg Oral Y92K   folic acid  1 mg Oral Daily   HYDROmorphone   Intravenous Q4H   ketorolac  15 mg Intravenous Q6H   mirtazapine  45 mg Oral QHS   mometasone-formoterol  2 puff Inhalation BID    multivitamin with minerals  1 tablet Oral Daily   rivaroxaban  20 mg Oral QPC supper   senna-docusate  1 tablet Oral BID   vitamin B-12  1,000 mcg Oral Daily   voxelotor  1,500 mg Oral Daily   Continuous Infusions:  dextrose 5 % and 0.45% NaCl 20 mL/hr at 11/23/20 1615   diphenhydrAMINE 25 mg (11/25/20 0926)   PRN Meds:.albuterol, diphenhydrAMINE **OR** diphenhydrAMINE, gabapentin, naloxone **AND** sodium chloride flush, ondansetron **OR** ondansetron (ZOFRAN) IV, polyethylene glycol, promethazine, senna-docusate  Consultants: None  Procedures: None  Antibiotics: None  Assessment/Plan: Principal Problem:   Sickle cell pain crisis ([place]) Active Problems:   Leukocytosis   Chest pain   Hx of pulmonary embolism (HCC)   Sickle cell anemia with pain (HCC)   Chronic pain syndrome  Hb Sickle Cell Disease with crisis: Continue [facility] at [facility], continue weight based Dilaudid PCA at current setting, continue IV Toradol 15 mg Q 6 H for a total 5 days, continue oral home pain medications.  Monitor vitals very closely, Re-evaluate pain scale regularly, 2 L of Oxygen by [place]. Sickle Cell Anemia: Hemoglobin has dropped to 6.5 today which is significantly below her baseline of above 8.  Although she is typically not transfused except hemoglobin drops below 6, however going by her symptoms today and possibility of further drop in hemoglobin, will go ahead and start with 1 unit of packed red blood cell.  Repeat labs in a.m. possible discharge home tomorrow morning. Chronic pain Syndrome: Continue home pain medications History of major depression and anxiety: Continue home medications.  Patient denies any suicidal ideations or thoughts.  Patient counseled extensively. History of pulmonary embolism: Continue Xarelto.  Code Status: Full Code Family Communication: N/A Disposition Plan: Not yet ready for discharge  Molly Gutierrez  If 7PM-7AM, please contact night-coverage.  11/25/2020, 1:34 PM  LOS: 4  days

## 2020-11-26 DIAGNOSIS — D5700 Hb-SS disease with crisis, unspecified: Secondary | ICD-10-CM | POA: Diagnosis not present

## 2020-11-26 DIAGNOSIS — G894 Chronic pain syndrome: Secondary | ICD-10-CM | POA: Diagnosis not present

## 2020-11-26 DIAGNOSIS — D5701 Hb-SS disease with acute chest syndrome: Secondary | ICD-10-CM | POA: Diagnosis not present

## 2020-11-26 DIAGNOSIS — I2699 Other pulmonary embolism without acute cor pulmonale: Secondary | ICD-10-CM | POA: Diagnosis not present

## 2020-11-26 DIAGNOSIS — D72829 Elevated white blood cell count, unspecified: Secondary | ICD-10-CM | POA: Diagnosis not present

## 2020-11-26 LAB — CBC WITH DIFFERENTIAL/PLATELET
Abs Immature Granulocytes: 0.14 10*3/uL — ABNORMAL HIGH (ref 0.00–0.07)
Basophils Absolute: 0.1 10*3/uL (ref 0.0–0.1)
Basophils Relative: 1 %
Eosinophils Absolute: 0.8 10*3/uL — ABNORMAL HIGH (ref 0.0–0.5)
Eosinophils Relative: 5 %
HCT: 22.8 % — ABNORMAL LOW (ref 36.0–46.0)
Hemoglobin: 7.8 g/dL — ABNORMAL LOW (ref 12.0–15.0)
Immature Granulocytes: 1 %
Lymphocytes Relative: 21 %
Lymphs Abs: 3.5 10*3/uL (ref 0.7–4.0)
MCH: 30.2 pg (ref 26.0–34.0)
MCHC: 34.2 g/dL (ref 30.0–36.0)
MCV: 88.4 fL (ref 80.0–100.0)
Monocytes Absolute: 1.7 10*3/uL — ABNORMAL HIGH (ref 0.1–1.0)
Monocytes Relative: 10 %
Neutro Abs: 10.1 10*3/uL — ABNORMAL HIGH (ref 1.7–7.7)
Neutrophils Relative %: 62 %
Platelets: 579 10*3/uL — ABNORMAL HIGH (ref 150–400)
RBC: 2.58 MIL/uL — ABNORMAL LOW (ref 3.87–5.11)
RDW: 22.6 % — ABNORMAL HIGH (ref 11.5–15.5)
WBC: 16.3 10*3/uL — ABNORMAL HIGH (ref 4.0–10.5)
nRBC: 0.8 % — ABNORMAL HIGH (ref 0.0–0.2)

## 2020-11-26 LAB — CULTURE, BLOOD (ROUTINE X 2)
Culture: NO GROWTH
Culture: NO GROWTH
Special Requests: ADEQUATE
Special Requests: ADEQUATE

## 2020-11-26 MED ORDER — HEPARIN SOD (PORK) LOCK FLUSH 100 UNIT/ML IV SOLN
500.0000 [IU] | INTRAVENOUS | Status: DC | PRN
  Filled 2020-11-26: qty 5

## 2020-11-26 MED ORDER — HYDROMORPHONE HCL 4 MG PO TABS: 4.0000 mg | ORAL_TABLET | Freq: Four times a day (QID) | ORAL | 0 refills | Status: DC | PRN

## 2020-11-26 NOTE — Discharge Summary (Signed)
Physician Discharge Summary  [facility] DGU:[id number] DOB: 03/02/1981 DOA: 11/21/2020  PCP: Molly Francois, NP  Admit date: 11/21/2020  Discharge date: 11/26/2020  Discharge Diagnoses:  Principal Problem:   Sickle cell pain crisis ([facility]) Active Problems:   Leukocytosis   Chest pain   Hx of pulmonary embolism (HCC)   Sickle cell anemia with pain (HCC)   Chronic pain syndrome   Discharge Condition: Stable  Disposition:   Follow-up Information     Molly Francois, NP. Schedule an appointment as soon as possible for a visit in 1 week(s).   Specialty: Adult Health Nurse Practitioner Why: As needed Contact information: [address] [place] [address] [phone]                Pt is discharged home in good condition and is to follow up with Molly Francois, NP this week to have labs evaluated. Molly Gutierrez is instructed to increase activity slowly and balance with rest for the next few days, and use prescribed medication to complete treatment of pain  Diet: Regular Wt Readings from Last 3 Encounters:  11/21/20 57.2 kg  11/07/20 57.6 kg  10/09/20 61 kg    History of present illness:  Molly Gutierrez is a 40 y.o. 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  She feels that her pain is typical for sickle cell pain crises.  She had some nausea and vomiting.  No abdominal pain and no chest pain or shortness of breath.  She went to the [facility] emergency department earlier today and was given pain meds did not help.  She subsequently left there and presented to [facility] given that her sickle cell doctors are usually here.  She continues to have generalized pruritus and itching and complains of 9  out of 10 in severity and pain.  She denies any other concerns or complaints at this time.  [facility] was asked to admit this patient for sickle cell pain crisis   ED Course: In the ED she was had basic blood work done and was started on IV fluid hydration.  Her COVID testing was negative.  Given multiple doses of Dilaudid IV as well as Benadryl and a dose of IV ketorolac.  Hospital Course:  Patient was admitted for sickle cell pain crisis and managed appropriately with IVF, IV Dilaudid via PCA and IV Toradol, as well as other adjunct therapies per sickle cell pain management protocols.  Patient hemoglobin dropped below baseline for which she was transfused with 1 unit of packed red blood cell with significant improvement.  Her hemoglobin improved to 7.8 at the time of discharge today.  She slowly improved in her symptoms, pain gradually improved to baseline.  She did not require any antibiotics during this admission.  Patient remained hemodynamically stable throughout his admission.  Blood cultures were negative and there was no signs of infection.  As of today, patient was ambulating well with no significant pain and tolerating p.o. intake with no restrictions.  Although patient still complains of some pain but it's at the level that can be managed at home with oral home pain medications which was prescribed and sent to her pharmacy. Patient was therefore discharged home today in a hemodynamically stable  condition.   [name] will follow-up with PCP within 1 week of this discharge. Molly Gutierrez was counseled extensively about nonpharmacologic means of pain management, patient verbalized understanding and was appreciative of  the care received during this admission.   We discussed the need for good hydration, monitoring of hydration status, avoidance of heat, cold, stress, and infection triggers. We discussed the need to be adherent with taking Hydrea and other home medications. Patient was reminded of the need to  seek medical attention immediately if any symptom of bleeding, anemia, or infection occurs.  Discharge Exam: Vitals:   11/26/20 0843 11/26/20 1012  BP:  122/85  Pulse:  84  Resp:  17  Temp:  98.7 F (37.1 C)  SpO2: 98% 97%   Vitals:   11/26/20 0519 11/26/20 0700 11/26/20 0843 11/26/20 1012  BP: (!) 131/91   122/85  Pulse: 84   84  Resp: 13   17  Temp: 98.7 F (37.1 C)   98.7 F (37.1 C)  TempSrc: Oral   Oral  SpO2: 98% 99% 98% 97%  Weight:      Height:        General appearance : Awake, alert, not in any distress. Speech Clear. Not toxic looking HEENT: Atraumatic and Normocephalic, pupils equally reactive to light and accomodation Neck: Supple, no JVD. No cervical lymphadenopathy.  Chest: Good air entry bilaterally, no added sounds  CVS: S1 S2 regular, no murmurs.  Abdomen: Bowel sounds present, Non tender and not distended with no gaurding, rigidity or rebound. Extremities: B/L Lower Ext shows no edema, both legs are warm to touch Neurology: Awake alert, and oriented X 3, CN II-XII intact, Non focal Skin: No Rash  Discharge Instructions  Discharge Instructions     Diet - low sodium heart healthy   Complete by: As directed    Increase activity slowly   Complete by: As directed       Allergies as of 11/26/2020       Reactions   Cefaclor Hives, Swelling   Hydroxyurea Palpitations, Other (See Comments)   Lowers "blood levels" and heart rate (causes HYPOtension); "it messes me up, it drops my levels and stuff"   Omeprazole Anaphylaxis, Other (See Comments)   Causes "sharp pains in the stomach"   Ketamine Palpitations, Other (See Comments)   "Pt states she has had previous reaction to ketamine. States she becomes flushed, heart races, dizzy, and feels like she is going to pass out."        Medication List     TAKE these medications    albuterol 108 (90 Base) MCG/ACT inhaler Commonly known as: VENTOLIN HFA Inhale 2 puffs into the lungs 2 (two) times daily  as needed for wheezing or shortness of breath.   celecoxib 200 MG capsule Commonly known as: CELEBREX Take 1 capsule (200 mg total) by mouth daily. What changed:  when to take this reasons to take this   Deferasirox 360 MG Tabs Commonly known as: Jadenu Take 3 tablets (1,080 mg total) by mouth daily before breakfast.   diphenhydrAMINE 25 mg capsule Commonly known as: BENADRYL Take 25 mg by mouth 3 (three) times daily as needed for itching.   folic acid 1 MG tablet Commonly known as: FOLVITE Take 1 tablet (1 mg total) by mouth daily.   gabapentin 300 MG capsule Commonly known as: NEURONTIN Take 1 capsule (300 mg total) by mouth 3 (three) times daily. What changed:  when to take this reasons to take this  HYDROmorphone 4 MG tablet Commonly known as: DILAUDID Take 1 tablet (4 mg total) by mouth every 6 (six) hours as needed for up to 15 days for moderate pain or severe pain.   mirtazapine 45 MG tablet Commonly known as: REMERON Take 1 tablet (45 mg total) by mouth at bedtime.   mometasone-formoterol 100-5 MCG/ACT Aero Commonly known as: DULERA Inhale 2 puffs into the lungs daily as needed for wheezing or shortness of breath.   omeprazole 20 MG capsule Commonly known as: PRILOSEC Take 1 capsule (20 mg total) by mouth 2 (two) times daily before a meal. What changed:  when to take this reasons to take this   Oxbryta 500 MG Tabs tablet Generic drug: voxelotor Take 1,500 mg by mouth daily.   polyethylene glycol 17 g packet Commonly known as: MIRALAX / GLYCOLAX Take 17 g by mouth daily as needed for mild constipation (MIX AND DRINK).   promethazine 25 MG tablet Commonly known as: PHENERGAN Take 0.5-1 tablets (12.5-25 mg total) by mouth every 6 (six) hours as needed for nausea or vomiting.   vitamin B-12 1000 MCG tablet Commonly known as: CYANOCOBALAMIN Take 1 tablet (1,000 mcg total) by mouth daily.   Vitamin D3 25 MCG (1000 UT) Caps Take 1 capsule (1,000 Units  total) by mouth daily.   Xarelto 20 MG Tabs tablet Generic drug: rivaroxaban Take 1 tablet (20 mg total) by mouth daily with supper. What changed: when to take this        The results of significant diagnostics from this hospitalization (including imaging, microbiology, ancillary and laboratory) are listed below for reference.    Significant Diagnostic Studies: DG Chest 2 View  Result Date: 11/22/2020 CLINICAL DATA:  Sickle cell disease, acute chest syndrome EXAM: CHEST - 2 VIEW COMPARISON:  11/07/2020 FINDINGS: Bibasilar opacification is accentuated by overlying soft tissue and slight underpenetration on this examination. Despite this, patchy infiltrate is present within the lung bases bilaterally, new since prior examination. No pneumothorax or pleural effusion. Cardiac size is mildly enlarged. Bilateral internal jugular chest port are in place with tips at the superior cavoatrial junction and within the mid right atrium. The pulmonary vascularity is normal. No acute bone abnormality. IMPRESSION: Interval development of mild patchy bibasilar pulmonary infiltrate. Stable cardiomegaly. Electronically Signed   By: Fidela Salisbury MD   On: 11/22/2020 20:01   DG Chest 2 View  Result Date: 11/07/2020 CLINICAL DATA:  Chest pain, LEFT leg pain for 2 weeks, sickle cell pain EXAM: CHEST - 2 VIEW COMPARISON:  09/21/2020 FINDINGS: BILATERAL jugular Port-A-Caths, RIGHT tip projecting over SVC, LEFT tip projecting over cavoatrial junction. Enlargement of cardiac silhouette with pulmonary vascular congestion. Mediastinal contours normal. Minimal chronic interstitial prominence unchanged. No acute infiltrate, pleural effusion, or pneumothorax. No acute osseous findings. IMPRESSION: Enlargement of cardiac silhouette with pulmonary vascular congestion. No acute abnormalities. Electronically Signed   By: Lavonia Dana M.D.   On: 11/07/2020 15:46    Microbiology: Recent Results (from the past 240 hour(s))  Resp  Panel by RT-PCR (Flu A&B, Covid) Nasopharyngeal Swab     Status: None   Collection Time: 11/21/20  4:46 PM   Specimen: Nasopharyngeal Swab; Nasopharyngeal(NP) swabs in vial transport medium  Result Value Ref Range Status   SARS Coronavirus 2 by RT PCR NEGATIVE NEGATIVE Final    Comment: (NOTE) SARS-CoV-2 target nucleic acids are NOT DETECTED.  The SARS-CoV-2 RNA is generally detectable in upper respiratory specimens during the acute phase of infection. The lowest concentration of  SARS-CoV-2 viral copies this assay can detect is 138 copies/mL. A negative result does not preclude SARS-Cov-2 infection and should not be used as the sole basis for treatment or other patient management decisions. A negative result may occur with  improper specimen collection/handling, submission of specimen other than nasopharyngeal swab, presence of viral mutation(s) within the areas targeted by this assay, and inadequate number of viral copies(<138 copies/mL). A negative result must be combined with clinical observations, patient history, and epidemiological information. The expected result is Negative.  Fact Sheet for Patients:  EntrepreneurPulse.com.au  Fact Sheet for Healthcare Providers:  IncredibleEmployment.be  This test is no t yet approved or cleared by the [place] FDA and  has been authorized for detection and/or diagnosis of SARS-CoV-2 by FDA under an Emergency Use Authorization (EUA). This EUA will remain  in effect (meaning this test can be used) for the duration of the COVID-19 declaration under Section 564(b)(1) of the Act, 21 U.S.C.section 360bbb-3(b)(1), unless the authorization is terminated  or revoked sooner.       Influenza A by PCR NEGATIVE NEGATIVE Final   Influenza B by PCR NEGATIVE NEGATIVE Final    Comment: (NOTE) The Xpert Xpress SARS-CoV-2/FLU/RSV plus assay is intended as an aid in the diagnosis of influenza from Nasopharyngeal  swab specimens and should not be used as a sole basis for treatment. Nasal washings and aspirates are unacceptable for Xpert Xpress SARS-CoV-2/FLU/RSV testing.  Fact Sheet for Patients: EntrepreneurPulse.com.au  Fact Sheet for Healthcare Providers: IncredibleEmployment.be  This test is not yet approved or cleared by the [place] FDA and has been authorized for detection and/or diagnosis of SARS-CoV-2 by FDA under an Emergency Use Authorization (EUA). This EUA will remain in effect (meaning this test can be used) for the duration of the COVID-19 declaration under Section 564(b)(1) of the Act, 21 U.S.C. section 360bbb-3(b)(1), unless the authorization is terminated or revoked.  Performed at [facility], [place] [address] [place], [place] [address]   Culture, blood (routine x 2)     Status: None   Collection Time: 11/21/20  7:06 PM   Specimen: BLOOD  Result Value Ref Range Status   Specimen Description   Final    BLOOD LEFT CHEST Performed at [facility] [address], [place], [place] [address]    Special Requests   Final    BOTTLES DRAWN AEROBIC AND ANAEROBIC Blood Culture adequate volume Performed at [facility] [address] [place], [place] [address]    Culture   Final    NO GROWTH 5 DAYS Performed at [facility], [place] [address], [place], [place] [address]    Report Status 11/26/2020 FINAL  Final  Culture, blood (routine x 2)     Status: None   Collection Time: 11/21/20  7:11 PM   Specimen: BLOOD  Result Value Ref Range Status   Specimen Description   Final    BLOOD LEFT CHEST Performed at [facility] [address] [place], [place] [address]    Special Requests   Final    BOTTLES DRAWN AEROBIC AND ANAEROBIC Blood Culture adequate volume Performed at [facility] [address], [place], [place] [address]     Culture   Final    NO GROWTH 5 DAYS Performed at [facility], [place] [address], [place], [place] [address]    Report Status 11/26/2020 FINAL  Final     Labs: Basic Metabolic Panel:  Recent Labs  Lab 11/21/20 1421 11/22/20 0430  NA 139 139  K 3.3* 3.8  CL 109 110  CO2 23 23  GLUCOSE 95 97  BUN 9 8  CREATININE 0.61 0.72  CALCIUM 8.9 8.4*  MG  --  2.2  PHOS  --  3.8   Liver Function Tests: Recent Labs  Lab 11/21/20 1421 11/22/20 0430  AST 27 24  ALT 15 14  ALKPHOS 57 51  BILITOT 2.8* 2.1*  PROT 7.7 7.1  ALBUMIN 4.5 3.9   No results for input(s): LIPASE, AMYLASE in the last 168 hours. No results for input(s): AMMONIA in the last 168 hours. CBC: Recent Labs  Lab 11/21/20 1421 11/22/20 0430 11/24/20 0551 11/25/20 1139 11/26/20 0541  WBC 15.9* 15.2* 19.1*  --  16.3*  NEUTROABS 10.9* 8.3*  --   --  10.1*  HGB 7.7* 7.0* 6.5* 6.5* 7.8*  HCT 23.1* 20.7* 19.2* 19.3* 22.8*  MCV 87.2 88.1 87.3  --  88.4  PLT 627* 589* 558*  --  579*   Cardiac Enzymes: No results for input(s): CKTOTAL, CKMB, CKMBINDEX, TROPONINI in the last 168 hours. BNP: Invalid input(s): POCBNP CBG: No results for input(s): GLUCAP in the last 168 hours.  Time coordinating discharge: 50 minutes  Signed:  [facility] 11/26/2020, 11:05 AM

## 2020-11-26 NOTE — Progress Notes (Deleted)
Patient triggering yellow due to pulse of  104 and BP 140/95. Patient is in no distress. Yellow mews implemented.

## 2020-11-26 NOTE — Progress Notes (Signed)
Patient triggering yellow due to pulse of  104 and BP 140/95. Patient is in no distress. Yellow mews implemented.  11/25/20 2354  Assess: MEWS Score  Temp 99 F (37.2 C)  BP (!) 140/95  Pulse Rate (!) 104  Resp 13  SpO2 97 %  O2 Device Room Air  Assess: MEWS Score  MEWS Temp 0  MEWS Systolic 0  MEWS Pulse 1  MEWS RR 1  MEWS LOC 0  MEWS Score 2  MEWS Score Color Yellow  Assess: if the MEWS score is Yellow or Red  Were vital signs taken at a resting state? Yes  Focused Assessment Change from prior assessment (see assessment flowsheet)  Does the patient meet 2 or more of the SIRS criteria? No  MEWS guidelines implemented *See Row Information* Yes  Take Vital Signs  Increase Vital Sign Frequency  Yellow: Q 2hr X 2 then Q 4hr X 2, if remains yellow, continue Q 4hrs  Escalate  MEWS: Escalate Yellow: discuss with charge nurse/RN and consider discussing with provider and RRT  Notify: Charge Nurse/RN  Name of Charge Nurse/RN Notified Dena Billet, RN  Date Charge Nurse/RN Notified 11/25/20  Time Charge Nurse/RN Notified [id number]  Assess: SIRS CRITERIA  SIRS Temperature  0  SIRS Pulse 1  SIRS Respirations  0  SIRS WBC 0  SIRS Score Sum  1

## 2020-11-27 LAB — BPAM RBC
Blood Product Expiration Date: 202207032359
ISSUE DATE / TIME: 202206120100
Unit Type and Rh: 6200

## 2020-11-27 LAB — TYPE AND SCREEN
ABO/RH(D): A POS
Antibody Screen: NEGATIVE
Unit division: 0

## 2020-12-05 ENCOUNTER — Telehealth: Payer: Self-pay

## 2020-12-05 NOTE — Telephone Encounter (Signed)
Hydromorphone  

## 2020-12-06 ENCOUNTER — Encounter: Payer: Self-pay | Admitting: Nurse Practitioner

## 2020-12-06 ENCOUNTER — Telehealth (INDEPENDENT_AMBULATORY_CARE_PROVIDER_SITE_OTHER): Payer: Medicare Other | Admitting: Nurse Practitioner

## 2020-12-06 DIAGNOSIS — G894 Chronic pain syndrome: Secondary | ICD-10-CM

## 2020-12-06 DIAGNOSIS — J4520 Mild intermittent asthma, uncomplicated: Secondary | ICD-10-CM

## 2020-12-06 DIAGNOSIS — D571 Sickle-cell disease without crisis: Secondary | ICD-10-CM

## 2020-12-06 DIAGNOSIS — Z7901 Long term (current) use of anticoagulants: Secondary | ICD-10-CM | POA: Diagnosis not present

## 2020-12-06 MED ORDER — HYDROMORPHONE HCL 4 MG PO TABS: 4.0000 mg | ORAL_TABLET | Freq: Four times a day (QID) | ORAL | 0 refills | Status: DC | PRN

## 2020-12-06 NOTE — Patient Instructions (Signed)
Sickle Cell Anemia, Adult  Sickle cell anemia is a condition where your red blood cells are shaped like sickles. Red blood cells carry oxygen through the body. Sickle-shaped cells do not live as long as normal red blood cells. They also clump together and block blood from flowing through the blood vessels. This prevents the body from getting enough oxygen. Sickle cell anemia causes organ damage and pain. It alsoincreases the risk of infection. Follow these instructions at home: Medicines Take over-the-counter and prescription medicines only as told by your doctor. If you were prescribed an antibiotic medicine, take it as told by your doctor. Do not stop taking the antibiotic even if you start to feel better. If you develop a fever, do not take medicines to lower the fever right away. Tell your doctor about the fever. Managing pain, stiffness, and swelling Try these methods to help with pain: Use a heating pad. Take a warm bath. Distract yourself, such as by watching TV. Eating and drinking Drink enough fluid to keep your pee (urine) clear or pale yellow. Drink more in hot weather and during exercise. Limit or avoid alcohol. Eat a healthy diet. Eat plenty of fruits, vegetables, whole grains, and lean protein. Take vitamins and supplements as told by your doctor. Traveling When traveling, keep these with you: Your medical information. The names of your doctors. Your medicines. If you need to take an airplane, talk to your doctor first. Activity Rest often. Avoid exercises that make your heart beat much faster, such as jogging. General instructions Do not use products that have nicotine or tobacco, such as cigarettes and e-cigarettes. If you need help quitting, ask your doctor. Consider wearing a medical alert bracelet. Avoid being in high places (high altitudes), such as mountains. Avoid very hot or cold temperatures. Avoid places where the temperature changes a lot. Keep all follow-up  visits as told by your doctor. This is important. Contact a doctor if: A joint hurts. Your feet or hands hurt or swell. You feel tired (fatigued). Get help right away if: You have symptoms of infection. These include: Fever. Chills. Being very tired. Irritability. Poor eating. Throwing up (vomiting). You feel dizzy or faint. You have new stomach pain, especially on the left side. You have a an erection (priapism) that lasts more than 4 hours. You have numbness in your arms or legs. You have a hard time moving your arms or legs. You have trouble talking. You have pain that does not go away when you take medicine. You are short of breath. You are breathing fast. You have a long-term cough. You have pain in your chest. You have a bad headache. You have a stiff neck. Your stomach looks bloated even though you did not eat much. Your skin is pale. You suddenly cannot see well. Summary Sickle cell anemia is a condition where your red blood cells are shaped like sickles. Follow your doctor's advice on ways to manage pain, food to eat, activities to do, and steps to take for safe travel. Get medical help right away if you have any signs of infection, such as a fever. This information is not intended to replace advice given to you by your health care provider. Make sure you discuss any questions you have with your healthcare provider. Document Revised: 10/28/2019 Document Reviewed: 10/28/2019 Elsevier Patient Education  [place] of Breath, Adult Shortness of breath means you have trouble breathing. Shortness of breath couldbe a sign of a medical problem. Follow these  instructions at home:  Watch for any changes in your symptoms. Do not use any products that contain nicotine or tobacco, such as cigarettes, e-cigarettes, and chewing tobacco. Do not smoke. Smoking can cause shortness of breath. If you need help to quit smoking, ask your doctor. Avoid things that  can make it harder to breathe, such as: Mold. Dust. Air pollution. Chemical smells. Things that can cause allergy symptoms (allergens), if you have allergies. Keep your living space clean. Use products that help remove mold and dust. Rest as needed. Slowly return to your normal activities. Take over-the-counter and prescription medicines only as told by your doctor. This includes oxygen therapy and inhaled medicines. Keep all follow-up visits as told by your doctor. This is important. Contact a doctor if: Your condition does not get better as soon as expected. You have a hard time doing your normal activities, even after you rest. You have new symptoms. Get help right away if: Your shortness of breath gets worse. You have trouble breathing when you are resting. You feel light-headed or you pass out (faint). You have a cough that is not helped by medicines. You cough up blood. You have pain with breathing. You have pain in your chest, arms, shoulders, or belly (abdomen). You have a fever. You cannot walk up stairs. You cannot exercise the way you normally do. These symptoms may represent a serious problem that is an emergency. Do not wait to see if the symptoms will go away. Get medical help right away. Call your local emergency services (911 in the [place] Do not drive yourself to the hospital. Summary Shortness of breath is when you have trouble breathing enough air. It can be a sign of a medical problem. Avoid things that make it hard for you to breathe, such as smoking, pollution, mold, and dust. Watch for any changes in your symptoms. Contact your doctor if you do not get better or you get worse. This information is not intended to replace advice given to you by your health care provider. Make sure you discuss any questions you have with your healthcare provider. Document Revised: 11/03/2017 Document Reviewed: 11/03/2017 Elsevier Patient Education  2022 [place].  Sinus  Tachycardia  Sinus tachycardia is a kind of fast heartbeat. In sinus tachycardia, the heart beats more than 100 times a minute. Sinus tachycardia starts in a part of the heart called the sinus node. Sinus tachycardia may be harmless, or it may be asign of a serious condition. What are the causes? This condition may be caused by: Exercise or exertion. A fever. Pain. Loss of body fluids (dehydration). Severe bleeding (hemorrhage). Anxiety and stress. Certain substances, including: Alcohol. Caffeine. Tobacco and nicotine products. Cold medicines. Illegal drugs. Medical conditions including: Heart disease. An infection. An overactive thyroid (hyperthyroidism). A lack of red blood cells (anemia). What are the signs or symptoms? Symptoms of this condition include: A feeling that the heart is beating quickly (palpitations). Suddenly noticing your heartbeat (cardiac awareness). Dizziness. Tiredness (fatigue). Shortness of breath. Chest pain. Nausea. Fainting. How is this diagnosed? This condition is diagnosed with: A physical exam. Other tests, such as: Blood tests. An electrocardiogram (ECG). This test measures the electrical activity of the heart. Ambulatory cardiac monitor. This records your heartbeats for 24 hours or more. You may be referred to a heart specialist (cardiologist). How is this treated? Treatment for this condition depends on the cause or the underlying condition. Treatment may involve: Treating the underlying condition. Taking new medicines or changing  your current medicines as told by your health care provider. Making changes to your diet or lifestyle. Follow these instructions at home: Lifestyle  Do not use any products that contain nicotine or tobacco, such as cigarettes and e-cigarettes. If you need help quitting, ask your health care provider. Do not use illegal drugs, such as cocaine. Learn relaxation methods to help you when you get stressed or  anxious. These include deep breathing. Avoid caffeine or other stimulants.  Alcohol use  Do not drink alcohol if: Your health care provider tells you not to drink. You are pregnant, may be pregnant, or are planning to become pregnant. If you drink alcohol, limit how much you have: 0-1 drink a day for women. 0-2 drinks a day for men. Be aware of how much alcohol is in your drink. In the [place] one drink equals one typical bottle of beer (12 oz), one-half glass of wine (5 oz), or one shot of hard liquor (1 oz).  General instructions Drink enough fluids to keep your urine pale yellow. Take over-the-counter and prescription medicines only as told by your health care provider. Keep all follow-up visits as told by your health care provider. This is important. Contact a health care provider if you have: A fever. Vomiting or diarrhea that does not go away. Get help right away if you: Have pain in your chest, upper arms, jaw, or neck. Become weak or dizzy. Feel faint. Have palpitations that do not go away. Summary In sinus tachycardia, the heart beats more than 100 times a minute. Sinus tachycardia may be harmless, or it may be a sign of a serious condition. Treatment for this condition depends on the cause or the underlying condition. Get help right away if you have pain in your chest, upper arms, jaw, or neck. This information is not intended to replace advice given to you by your health care provider. Make sure you discuss any questions you have with your healthcare provider. Document Revised: 07/23/2017 Document Reviewed: 07/23/2017 Elsevier Patient Education  [place].

## 2020-12-06 NOTE — Progress Notes (Signed)
   [address] [place], [place]  [address] Phone:  [phone]   Fax:  [phone] Virtual Visit via Video Note  I connected with [facility] on 12/06/20 at  3:00 PM EDT by video and verified that I am speaking with the correct person using two identifiers.   I discussed the limitations, risks, security and privacy concerns of performing an evaluation and management service by video and the availability of in person appointments. I also discussed with the patient that there may be a patient responsible charge related to this service. The patient expressed understanding and agreed to proceed.  Patient home Provider Office  History of Present Illness:  Patient is in today for ED follow-up. She was treated and released for SCD crisis with chest pain.She has has frequent flare ups. Her chest pain is typical for her SCD crisis. She was treated with IV Dilaudid 2.5 mg,  Benadryl 25 mg, Toradol and IV fluids. She is followed by hematology in [place].   She has shortness of breath with ambulating to the bathroom.   Denies fever, headache, cough, wheezing,chest pains, abdominal pain, back pain, hip pain, or leg pain. Denies any open wounds, skin irritation.     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 Diagnoses of Mild intermittent asthma, unspecified whether complicated, Anticoagulant long-term use, and Chronic pain syndrome were also pertinent to this visit.  Visit Diagnosis: 1. Hb-SS disease without crisis ([place])  Stable recent labs will continue to monitor closely Ensure adequate hydration. Move frequently to reduce venous thromboembolism risk. Avoid situations that could lead to dehydration or could exacerbate pain Discussed S&S of infection, seizures, stroke acute  chest, DVT and how important it is to seek medical attention Take medication as directed along with pain contract and overall compliance Discussed the risk related to opiate use (addition, tolerance and dependency)   2. Mild intermittent asthma, unspecified whether complicated  Persistent continue with Albuterol Encouraged patient to obtain pulse ox to evaluate oxygen saturation due to shortness of breath with ambulation. To assist with ongoing evaluation  3. Anticoagulant long-term use  Stable no abnormal bleeding  4. Chronic pain syndrome  Persistent currently controlled     Follow Up Instructions:    I discussed the assessment and treatment plan with the patient. The patient was provided an opportunity to ask questions and all were answered. The patient agreed with the plan and demonstrated an understanding of the instructions.   The patient was advised to call back or seek an in-person evaluation if the symptoms worsen or if the condition fails to improve as anticipated.  I provided 16 minutes of video- visit time during this encounter.   Vevelyn Francois, NP

## 2020-12-07 DIAGNOSIS — K219 Gastro-esophageal reflux disease without esophagitis: Secondary | ICD-10-CM | POA: Diagnosis present

## 2020-12-07 DIAGNOSIS — G894 Chronic pain syndrome: Secondary | ICD-10-CM | POA: Diagnosis not present

## 2020-12-07 DIAGNOSIS — Z9851 Tubal ligation status: Secondary | ICD-10-CM

## 2020-12-07 DIAGNOSIS — Z7901 Long term (current) use of anticoagulants: Secondary | ICD-10-CM

## 2020-12-07 DIAGNOSIS — Z765 Malingerer [conscious simulation]: Secondary | ICD-10-CM

## 2020-12-07 DIAGNOSIS — D5701 Hb-SS disease with acute chest syndrome: Principal | ICD-10-CM | POA: Diagnosis present

## 2020-12-07 DIAGNOSIS — Z9049 Acquired absence of other specified parts of digestive tract: Secondary | ICD-10-CM

## 2020-12-07 DIAGNOSIS — Z79899 Other long term (current) drug therapy: Secondary | ICD-10-CM | POA: Diagnosis not present

## 2020-12-07 DIAGNOSIS — Z8249 Family history of ischemic heart disease and other diseases of the circulatory system: Secondary | ICD-10-CM | POA: Diagnosis not present

## 2020-12-07 DIAGNOSIS — J45909 Unspecified asthma, uncomplicated: Secondary | ICD-10-CM | POA: Diagnosis present

## 2020-12-07 DIAGNOSIS — F329 Major depressive disorder, single episode, unspecified: Secondary | ICD-10-CM | POA: Diagnosis present

## 2020-12-07 DIAGNOSIS — Z20822 Contact with and (suspected) exposure to covid-19: Secondary | ICD-10-CM | POA: Diagnosis present

## 2020-12-07 DIAGNOSIS — Z86711 Personal history of pulmonary embolism: Secondary | ICD-10-CM | POA: Diagnosis not present

## 2020-12-07 DIAGNOSIS — Z888 Allergy status to other drugs, medicaments and biological substances status: Secondary | ICD-10-CM

## 2020-12-07 DIAGNOSIS — Z86718 Personal history of other venous thrombosis and embolism: Secondary | ICD-10-CM | POA: Diagnosis not present

## 2020-12-07 DIAGNOSIS — D5700 Hb-SS disease with crisis, unspecified: Secondary | ICD-10-CM | POA: Diagnosis present

## 2020-12-07 DIAGNOSIS — F419 Anxiety disorder, unspecified: Secondary | ICD-10-CM | POA: Diagnosis present

## 2020-12-08 ENCOUNTER — Encounter (HOSPITAL_COMMUNITY): Payer: Self-pay | Admitting: Emergency Medicine

## 2020-12-08 ENCOUNTER — Inpatient Hospital Stay (HOSPITAL_COMMUNITY)
Admission: EM | Admit: 2020-12-08 | Discharge: 2020-12-11 | DRG: 812 | Disposition: A | Discharge status: Home / Self Care | Payer: Medicare Other | Attending: Internal Medicine | Admitting: Internal Medicine

## 2020-12-08 ENCOUNTER — Other Ambulatory Visit: Payer: Self-pay | Admitting: Family Medicine

## 2020-12-08 ENCOUNTER — Emergency Department (HOSPITAL_COMMUNITY): Payer: Medicare Other

## 2020-12-08 ENCOUNTER — Other Ambulatory Visit: Payer: Self-pay

## 2020-12-08 DIAGNOSIS — Z20822 Contact with and (suspected) exposure to covid-19: Secondary | ICD-10-CM | POA: Diagnosis present

## 2020-12-08 DIAGNOSIS — D5701 Hb-SS disease with acute chest syndrome: Secondary | ICD-10-CM | POA: Diagnosis present

## 2020-12-08 DIAGNOSIS — K219 Gastro-esophageal reflux disease without esophagitis: Secondary | ICD-10-CM | POA: Diagnosis present

## 2020-12-08 DIAGNOSIS — Z9049 Acquired absence of other specified parts of digestive tract: Secondary | ICD-10-CM | POA: Diagnosis not present

## 2020-12-08 DIAGNOSIS — G894 Chronic pain syndrome: Secondary | ICD-10-CM | POA: Diagnosis present

## 2020-12-08 DIAGNOSIS — Z888 Allergy status to other drugs, medicaments and biological substances status: Secondary | ICD-10-CM | POA: Diagnosis not present

## 2020-12-08 DIAGNOSIS — Z765 Malingerer [conscious simulation]: Secondary | ICD-10-CM

## 2020-12-08 DIAGNOSIS — R0789 Other chest pain: Secondary | ICD-10-CM | POA: Diagnosis present

## 2020-12-08 DIAGNOSIS — Z79899 Other long term (current) drug therapy: Secondary | ICD-10-CM | POA: Diagnosis not present

## 2020-12-08 DIAGNOSIS — D5700 Hb-SS disease with crisis, unspecified: Secondary | ICD-10-CM | POA: Diagnosis present

## 2020-12-08 DIAGNOSIS — Z8249 Family history of ischemic heart disease and other diseases of the circulatory system: Secondary | ICD-10-CM | POA: Diagnosis not present

## 2020-12-08 DIAGNOSIS — F32A Depression, unspecified: Secondary | ICD-10-CM | POA: Diagnosis present

## 2020-12-08 DIAGNOSIS — F329 Major depressive disorder, single episode, unspecified: Secondary | ICD-10-CM | POA: Diagnosis present

## 2020-12-08 DIAGNOSIS — Z7901 Long term (current) use of anticoagulants: Secondary | ICD-10-CM | POA: Diagnosis not present

## 2020-12-08 DIAGNOSIS — Z86718 Personal history of other venous thrombosis and embolism: Secondary | ICD-10-CM | POA: Diagnosis not present

## 2020-12-08 DIAGNOSIS — F419 Anxiety disorder, unspecified: Secondary | ICD-10-CM | POA: Diagnosis present

## 2020-12-08 DIAGNOSIS — J45909 Unspecified asthma, uncomplicated: Secondary | ICD-10-CM | POA: Diagnosis present

## 2020-12-08 DIAGNOSIS — Z9851 Tubal ligation status: Secondary | ICD-10-CM | POA: Diagnosis not present

## 2020-12-08 DIAGNOSIS — Z86711 Personal history of pulmonary embolism: Secondary | ICD-10-CM | POA: Diagnosis not present

## 2020-12-08 LAB — CBC WITH DIFFERENTIAL/PLATELET
Abs Immature Granulocytes: 0.07 10*3/uL (ref 0.00–0.07)
Basophils Absolute: 0.1 10*3/uL (ref 0.0–0.1)
Basophils Relative: 1 %
Eosinophils Absolute: 0.3 10*3/uL (ref 0.0–0.5)
Eosinophils Relative: 2 %
HCT: 20.7 % — ABNORMAL LOW (ref 36.0–46.0)
Hemoglobin: 6.9 g/dL — CL (ref 12.0–15.0)
Immature Granulocytes: 1 %
Lymphocytes Relative: 26 %
Lymphs Abs: 3.9 10*3/uL (ref 0.7–4.0)
MCH: 28.5 pg (ref 26.0–34.0)
MCHC: 33.3 g/dL (ref 30.0–36.0)
MCV: 85.5 fL (ref 80.0–100.0)
Monocytes Absolute: 2.3 10*3/uL — ABNORMAL HIGH (ref 0.1–1.0)
Monocytes Relative: 15 %
Neutro Abs: 8.3 10*3/uL — ABNORMAL HIGH (ref 1.7–7.7)
Neutrophils Relative %: 55 %
Platelets: 651 10*3/uL — ABNORMAL HIGH (ref 150–400)
RBC: 2.42 MIL/uL — ABNORMAL LOW (ref 3.87–5.11)
RDW: 20 % — ABNORMAL HIGH (ref 11.5–15.5)
WBC: 15 10*3/uL — ABNORMAL HIGH (ref 4.0–10.5)
nRBC: 0.1 % (ref 0.0–0.2)

## 2020-12-08 LAB — I-STAT BETA HCG BLOOD, ED (MC, WL, AP ONLY): I-stat hCG, quantitative: <5 m[IU]/mL (ref <5)

## 2020-12-08 LAB — RESP PANEL BY RT-PCR (FLU A&B, COVID) ARPGX2
Influenza A by PCR: NEGATIVE
Influenza B by PCR: NEGATIVE
SARS Coronavirus 2 by RT PCR: NEGATIVE

## 2020-12-08 LAB — COMPREHENSIVE METABOLIC PANEL
ALT: 12 U/L (ref 0–44)
AST: 30 U/L (ref 15–41)
Albumin: 4.5 g/dL (ref 3.5–5.0)
Alkaline Phosphatase: 48 U/L (ref 38–126)
Anion gap: 8 (ref 5–15)
BUN: 11 mg/dL (ref 6–20)
CO2: 23 mmol/L (ref 22–32)
Calcium: 9 mg/dL (ref 8.9–10.3)
Chloride: 107 mmol/L (ref 98–111)
Creatinine, Ser: 0.68 mg/dL (ref 0.44–1.00)
GFR, Estimated: >60 mL/min (ref >60)
Glucose, Bld: 88 mg/dL (ref 70–99)
Potassium: 3.7 mmol/L (ref 3.5–5.1)
Sodium: 138 mmol/L (ref 135–145)
Total Bilirubin: 2.9 mg/dL — ABNORMAL HIGH (ref 0.3–1.2)
Total Protein: 7.8 g/dL (ref 6.5–8.1)

## 2020-12-08 LAB — RETICULOCYTES
Immature Retic Fract: 11.1 % (ref 2.3–15.9)
RBC.: 2.44 MIL/uL — ABNORMAL LOW (ref 3.87–5.11)
Retic Count, Absolute: 86.6 10*3/uL (ref 19.0–186.0)
Retic Ct Pct: 3.6 % — ABNORMAL HIGH (ref 0.4–3.1)

## 2020-12-08 LAB — PROTIME-INR
INR: 1.2 (ref 0.8–1.2)
Prothrombin Time: 14.9 seconds (ref 11.4–15.2)

## 2020-12-08 MED ORDER — NALOXONE HCL 0.4 MG/ML IJ SOLN: 0.4000 mg | INTRAMUSCULAR | Status: DC | PRN

## 2020-12-08 MED ORDER — DIPHENHYDRAMINE HCL 25 MG PO CAPS
25.0000 mg | ORAL_CAPSULE | ORAL | Status: DC | PRN
  Filled 2020-12-08 (×2): qty 1

## 2020-12-08 MED ORDER — SODIUM CHLORIDE 0.45 % IV SOLN: INTRAVENOUS | Status: DC

## 2020-12-08 MED ORDER — GABAPENTIN 300 MG PO CAPS
300.0000 mg | ORAL_CAPSULE | Freq: Three times a day (TID) | ORAL | Status: DC
  Administered 2020-12-08 – 2020-12-11 (×7): 300 mg via ORAL
  Filled 2020-12-08 (×8): qty 1

## 2020-12-08 MED ORDER — VOXELOTOR 500 MG PO TABS: 1500.0000 mg | ORAL_TABLET | Freq: Every day | ORAL | Status: DC

## 2020-12-08 MED ORDER — MIRTAZAPINE 7.5 MG PO TABS
45.0000 mg | ORAL_TABLET | Freq: Every day | ORAL | Status: DC
  Administered 2020-12-08 – 2020-12-10 (×3): 45 mg via ORAL
  Filled 2020-12-08 (×3): qty 2

## 2020-12-08 MED ORDER — HYDROMORPHONE HCL 1 MG/ML IJ SOLN
1.0000 mg | INTRAMUSCULAR | Status: DC | PRN
  Administered 2020-12-08 (×2): 1 mg via INTRAVENOUS
  Filled 2020-12-08 (×2): qty 1

## 2020-12-08 MED ORDER — DIPHENHYDRAMINE HCL 25 MG PO CAPS
50.0000 mg | ORAL_CAPSULE | Freq: Once | ORAL | Status: AC
  Administered 2020-12-08: 50 mg via ORAL
  Filled 2020-12-08: qty 2

## 2020-12-08 MED ORDER — SODIUM CHLORIDE 0.9 % IV SOLN
25.0000 mg | INTRAVENOUS | Status: DC | PRN
  Administered 2020-12-08 – 2020-12-10 (×13): 25 mg via INTRAVENOUS
  Filled 2020-12-08: qty 0.5
  Filled 2020-12-08 (×9): qty 25
  Filled 2020-12-08: qty 0.5
  Filled 2020-12-08 (×3): qty 25
  Filled 2020-12-08: qty 0.5
  Filled 2020-12-08: qty 25

## 2020-12-08 MED ORDER — FOLIC ACID 1 MG PO TABS
1.0000 mg | ORAL_TABLET | Freq: Every day | ORAL | Status: DC
  Administered 2020-12-09 – 2020-12-11 (×3): 1 mg via ORAL
  Filled 2020-12-08 (×3): qty 1

## 2020-12-08 MED ORDER — MOMETASONE FURO-FORMOTEROL FUM 100-5 MCG/ACT IN AERO
2.0000 | INHALATION_SPRAY | Freq: Two times a day (BID) | RESPIRATORY_TRACT | Status: DC
  Administered 2020-12-08 – 2020-12-10 (×4): 2 via RESPIRATORY_TRACT
  Filled 2020-12-08: qty 8.8

## 2020-12-08 MED ORDER — HYDROMORPHONE HCL 2 MG/ML IJ SOLN
2.0000 mg | INTRAMUSCULAR | Status: AC
  Administered 2020-12-08: 2 mg via INTRAVENOUS
  Filled 2020-12-08: qty 1

## 2020-12-08 MED ORDER — KETOROLAC TROMETHAMINE 30 MG/ML IJ SOLN
15.0000 mg | Freq: Four times a day (QID) | INTRAMUSCULAR | Status: DC
  Administered 2020-12-08 – 2020-12-11 (×11): 15 mg via INTRAVENOUS
  Filled 2020-12-08 (×13): qty 1

## 2020-12-08 MED ORDER — SODIUM CHLORIDE 0.9% FLUSH: 9.0000 mL | INTRAVENOUS | Status: DC | PRN

## 2020-12-08 MED ORDER — SENNOSIDES-DOCUSATE SODIUM 8.6-50 MG PO TABS
1.0000 | ORAL_TABLET | Freq: Two times a day (BID) | ORAL | Status: DC
  Administered 2020-12-08 – 2020-12-11 (×7): 1 via ORAL
  Filled 2020-12-08 (×7): qty 1

## 2020-12-08 MED ORDER — DIPHENHYDRAMINE HCL 50 MG/ML IJ SOLN
25.0000 mg | Freq: Once | INTRAMUSCULAR | Status: AC
  Administered 2020-12-08: 25 mg via INTRAVENOUS
  Filled 2020-12-08: qty 1

## 2020-12-08 MED ORDER — KETOROLAC TROMETHAMINE 15 MG/ML IJ SOLN
15.0000 mg | INTRAMUSCULAR | Status: AC
  Administered 2020-12-08: 15 mg via INTRAVENOUS
  Filled 2020-12-08: qty 1

## 2020-12-08 MED ORDER — POLYETHYLENE GLYCOL 3350 17 G PO PACK: 17.0000 g | PACK | Freq: Every day | ORAL | Status: DC | PRN

## 2020-12-08 MED ORDER — KETOROLAC TROMETHAMINE 30 MG/ML IJ SOLN
30.0000 mg | Freq: Four times a day (QID) | INTRAMUSCULAR | Status: DC
  Administered 2020-12-08: 30 mg via INTRAVENOUS
  Filled 2020-12-08: qty 1

## 2020-12-08 MED ORDER — RIVAROXABAN 20 MG PO TABS
20.0000 mg | ORAL_TABLET | Freq: Every day | ORAL | Status: DC
  Administered 2020-12-08 – 2020-12-10 (×3): 20 mg via ORAL
  Filled 2020-12-08 (×4): qty 1

## 2020-12-08 MED ORDER — HYDROMORPHONE HCL 1 MG/ML IJ SOLN
1.0000 mg | Freq: Once | INTRAMUSCULAR | Status: AC
  Administered 2020-12-08: 1 mg via INTRAVENOUS
  Filled 2020-12-08: qty 1

## 2020-12-08 MED ORDER — DEFERASIROX 360 MG PO TABS: 1080.0000 mg | ORAL_TABLET | Freq: Every day | ORAL | Status: DC

## 2020-12-08 MED ORDER — HYDROMORPHONE 1 MG/ML IV SOLN
INTRAVENOUS | Status: DC
Start: 2020-12-08 — End: 2020-12-10
  Administered 2020-12-08: 1.5 mg via INTRAVENOUS
  Administered 2020-12-08: 30 mg via INTRAVENOUS
  Administered 2020-12-08: 2.5 mg via INTRAVENOUS
  Administered 2020-12-08: 5 mg via INTRAVENOUS
  Administered 2020-12-09: 0.5 mg via INTRAVENOUS
  Administered 2020-12-09: 30 mg via INTRAVENOUS
  Administered 2020-12-09: 4 mg via INTRAVENOUS
  Administered 2020-12-09: 5 mg via INTRAVENOUS
  Administered 2020-12-09: 6.5 mg via INTRAVENOUS
  Administered 2020-12-09: 5.5 mg via INTRAVENOUS
  Administered 2020-12-09: 0.5 mg via INTRAVENOUS
  Administered 2020-12-10: 4.5 mg via INTRAVENOUS
  Administered 2020-12-10 (×2): 8 mg via INTRAVENOUS
  Administered 2020-12-10: 4 mg via INTRAVENOUS
  Filled 2020-12-08 (×2): qty 30

## 2020-12-08 MED ORDER — ONDANSETRON HCL 4 MG/2ML IJ SOLN
4.0000 mg | Freq: Four times a day (QID) | INTRAMUSCULAR | Status: DC | PRN
  Administered 2020-12-08: 4 mg via INTRAVENOUS
  Filled 2020-12-08: qty 2

## 2020-12-08 MED ORDER — SODIUM CHLORIDE 0.9 % IV SOLN
12.5000 mg | Freq: Four times a day (QID) | INTRAVENOUS | Status: DC | PRN
  Administered 2020-12-08 – 2020-12-10 (×8): 12.5 mg via INTRAVENOUS
  Filled 2020-12-08 (×6): qty 12.5
  Filled 2020-12-08: qty 0.5
  Filled 2020-12-08: qty 12.5
  Filled 2020-12-08: qty 0.5

## 2020-12-08 MED ORDER — CHLORHEXIDINE GLUCONATE CLOTH 2 % EX PADS
6.0000 | MEDICATED_PAD | Freq: Every day | CUTANEOUS | Status: DC
  Administered 2020-12-08 – 2020-12-10 (×3): 6 via TOPICAL

## 2020-12-08 NOTE — H&P (Signed)
History and Physical    [facility] LKG:[id number] DOB: 1980/09/03 DOA: 12/08/2020  PCP: Vevelyn Francois, NP  Patient coming from: Home  I have personally briefly reviewed patient's old medical records in [place]  Chief Complaint: SCC  HPI: Molly Gutierrez is a 40 y.o. female with medical history significant of sickle cell anemia, prior PE.  Pt presents to the ED with c/o sickle cell pain crisis.  Pain in chest, typical of sickle cell symptoms.  Usual home pain meds not effective, pain is severe, worsening.  No fever nor cough.   ED Course: HGB 6.9 (usually gets transfused at 6.0).  Pain persists despite dilaudid in ED.   Review of Systems: As per HPI, otherwise all review of systems negative.  Past Medical History:  Diagnosis Date   Asthma    Eczema    History of pulmonary embolus (PE)    Sickle cell anemia ([place])     Past Surgical History:  Procedure Laterality Date   CHOLECYSTECTOMY     ERCP     JOINT REPLACEMENT     PORTA CATH INSERTION     TUBAL LIGATION     WISDOM TOOTH EXTRACTION       reports that she has never smoked. She has never used smokeless tobacco. She reports that she does not drink alcohol and does not use drugs.  Allergies  Allergen Reactions   Cefaclor Hives and Swelling   Hydroxyurea Palpitations and Other (See Comments)    Lowers "blood levels" and heart rate (causes HYPOtension); "it messes me up, it drops my levels and stuff"    Omeprazole Anaphylaxis and Other (See Comments)    Causes "sharp pains in the stomach"   Ketamine Palpitations and Other (See Comments)    "Pt states she has had previous reaction to ketamine. States she becomes flushed, heart races, dizzy, and feels like she is going to pass out."    Family History  Problem Relation Age of Onset   Renal Disease Mother    Hypertension Mother    High Cholesterol Mother      Prior to Admission medications   Medication Sig Start Date End Date  Taking? Authorizing Provider  albuterol (VENTOLIN HFA) 108 (90 Base) MCG/ACT inhaler Inhale 2 puffs into the lungs 2 (two) times daily as needed for wheezing or shortness of breath. 04/13/20 04/13/21  Vevelyn Francois, NP  celecoxib (CELEBREX) 200 MG capsule Take 1 capsule (200 mg total) by mouth daily. 08/02/20 08/02/21  Vevelyn Francois, NP  Cholecalciferol (VITAMIN D3) 25 MCG (1000 UT) CAPS Take 1 capsule (1,000 Units total) by mouth daily. 04/13/20 04/13/21  Vevelyn Francois, NP  Deferasirox (JADENU) 360 MG TABS Take 3 tablets (1,080 mg total) by mouth daily before breakfast. 04/13/20 04/13/21  Vevelyn Francois, NP  diphenhydrAMINE (BENADRYL) 25 mg capsule Take 25 mg by mouth 3 (three) times daily as needed for itching.    [provider]  folic acid (FOLVITE) 1 MG tablet Take 1 tablet (1 mg total) by mouth daily. 04/13/20 04/13/21  Vevelyn Francois, NP  gabapentin (NEURONTIN) 300 MG capsule Take 1 capsule (300 mg total) by mouth 3 (three) times daily. 04/13/20 04/13/21  Vevelyn Francois, NP  HYDROmorphone (DILAUDID) 4 MG tablet Take 1 tablet (4 mg total) by mouth every 6 (six) hours as needed for up to 15 days for moderate pain or severe pain. 12/11/20 12/26/20  Vevelyn Francois, NP  mirtazapine (REMERON) 45 MG tablet Take  1 tablet (45 mg total) by mouth at bedtime. 04/13/20 04/13/21  Vevelyn Francois, NP  mometasone-formoterol (DULERA) 100-5 MCG/ACT AERO Inhale 2 puffs into the lungs daily as needed for wheezing or shortness of breath. 04/13/20 04/13/21  Vevelyn Francois, NP  omeprazole (PRILOSEC) 20 MG capsule Take 1 capsule (20 mg total) by mouth 2 (two) times daily before a meal. 08/02/20 08/02/21  King, Diona Foley, NP  OXBRYTA 500 MG TABS tablet Take 1,500 mg by mouth daily. 10/30/20   [provider]  polyethylene glycol (MIRALAX / GLYCOLAX) 17 g packet Take 17 g by mouth daily as needed for mild constipation ([place]).    [provider]  promethazine (PHENERGAN) 25 MG  tablet Take 0.5-1 tablets (12.5-25 mg total) by mouth every 6 (six) hours as needed for nausea or vomiting. 10/25/20 10/25/21  Vevelyn Francois, NP  vitamin B-12 (CYANOCOBALAMIN) 1000 MCG tablet Take 1 tablet (1,000 mcg total) by mouth daily. 04/13/20 04/13/21  Vevelyn Francois, NP  XARELTO 20 MG TABS tablet Take 1 tablet (20 mg total) by mouth daily with supper. 10/25/20 10/25/21  Vevelyn Francois, NP    Physical Exam: Vitals:   12/08/20 0100 12/08/20 0130 12/08/20 0200 12/08/20 0230  BP: 127/84 103/69 121/80 119/87  Pulse: 84 87 84 (!) 104  Resp: 11 14 13  (!) 28  Temp:      TempSrc:      SpO2: 100% 99% 100% 100%    Constitutional: NAD, calm, uncomfortable Eyes: PERRL, lids and conjunctivae normal ENMT: Mucous membranes are moist. Posterior pharynx clear of any exudate or lesions.Normal dentition.  Neck: normal, supple, no masses, no thyromegaly Respiratory: clear to auscultation bilaterally, no wheezing, no crackles. Normal respiratory effort. No accessory muscle use.  Cardiovascular: Regular rate and rhythm, no murmurs / rubs / gallops. No extremity edema. 2+ pedal pulses. No carotid bruits.  Abdomen: no tenderness, no masses palpated. No hepatosplenomegaly. Bowel sounds positive.  Musculoskeletal: no clubbing / cyanosis. No joint deformity upper and lower extremities. Good ROM, no contractures. Normal muscle tone.  Skin: no rashes, lesions, ulcers. No induration Neurologic: CN 2-12 grossly intact. Sensation intact, DTR normal. Strength 5/5 in all 4.  Psychiatric: Normal judgment and insight. Alert and oriented x 3. Normal mood.    Labs on Admission: I have personally reviewed following labs and imaging studies  CBC: Recent Labs  Lab 12/08/20 0100  WBC 15.0*  NEUTROABS 8.3*  HGB 6.9*  HCT 20.7*  MCV 85.5  PLT 656*   Basic Metabolic Panel: Recent Labs  Lab 12/08/20 0100  NA 138  K 3.7  CL 107  CO2 23  GLUCOSE 88  BUN 11  CREATININE 0.68  CALCIUM 9.0   GFR: CrCl  cannot be calculated (Unknown ideal weight.). Liver Function Tests: Recent Labs  Lab 12/08/20 0100  AST 30  ALT 12  ALKPHOS 48  BILITOT 2.9*  PROT 7.8  ALBUMIN 4.5   No results for input(s): LIPASE, AMYLASE in the last 168 hours. No results for input(s): AMMONIA in the last 168 hours. Coagulation Profile: Recent Labs  Lab 12/08/20 0053  INR 1.2   Cardiac Enzymes: No results for input(s): CKTOTAL, CKMB, CKMBINDEX, TROPONINI in the last 168 hours. BNP (last 3 results) No results for input(s): PROBNP in the last 8760 hours. HbA1C: No results for input(s): HGBA1C in the last 72 hours. CBG: No results for input(s): GLUCAP in the last 168 hours. Lipid Profile: No results for input(s): CHOL, HDL,  LDLCALC, TRIG, CHOLHDL, LDLDIRECT in the last 72 hours. Thyroid Function Tests: No results for input(s): TSH, T4TOTAL, FREET4, T3FREE, THYROIDAB in the last 72 hours. Anemia Panel: Recent Labs    12/08/20 0100  RETICCTPCT 3.6*   Urine analysis:    Component Value Date/Time   COLORURINE YELLOW 11/22/2020 0430   APPEARANCEUR HAZY (A) 11/22/2020 0430   LABSPEC 1.010 11/22/2020 0430   PHURINE 6.0 11/22/2020 0430   GLUCOSEU NEGATIVE 11/22/2020 0430   HGBUR SMALL (A) 11/22/2020 0430   BILIRUBINUR NEGATIVE 11/22/2020 0430   BILIRUBINUR neg 08/03/2020 1429   KETONESUR NEGATIVE 11/22/2020 0430   PROTEINUR NEGATIVE 11/22/2020 0430   UROBILINOGEN 1.0 08/03/2020 1429   NITRITE NEGATIVE 11/22/2020 0430   LEUKOCYTESUR NEGATIVE 11/22/2020 0430    Radiological Exams on Admission: DG Chest 2 View  Result Date: 12/08/2020 CLINICAL DATA:  Chest pain.  Sickle cell. EXAM: CHEST - 2 VIEW COMPARISON:  Chest x-ray 11/22/2020, CT chest 01/07/2020, chest x-ray 11/07/2020 FINDINGS: Bilateral chest wall Port-A-Cath with left-side overlying the right atrium and right side overlying the superior cavoatrial junction. The heart size and mediastinal contours unchanged with persistent enlarged cardiac  silhouette. No focal consolidation. Persistent coarsened interstitial markings with no overt pulmonary edema. No pleural effusion. No pneumothorax. No acute osseous abnormality.  Right upper quadrant surgical clips. IMPRESSION: No active cardiopulmonary disease. Electronically Signed   By: Iven Finn M.D.   On: 12/08/2020 00:43    EKG: Independently reviewed.  Assessment/Plan Active Problems:   Sickle cell pain crisis (HCC)    Sickle cell pain crisis - Sickle cell pathway Scheduled toradol Dilaudid PCA HGB 6.9 but pt usually doesn't get transfusion until 6.0 she states H/o PE - Cont Xarelto  DVT prophylaxis: Xarelto Code Status: Full Family Communication: No family in room Disposition Plan: Home after pain controlled Consults called: None Admission status: Admit to inpatient  Severity of Illness: The appropriate patient status for this patient is INPATIENT. Inpatient status is judged to be reasonable and necessary in order to provide the required intensity of service to ensure the patient's safety. The patient's presenting symptoms, physical exam findings, and initial radiographic and laboratory data in the context of their chronic comorbidities is felt to place them at high risk for further clinical deterioration. Furthermore, it is not anticipated that the patient will be medically stable for discharge from the hospital within 2 midnights of admission. The following factors support the patient status of inpatient.   IP status for dilaudid PCA.   * I certify that at the point of admission it is my clinical judgment that the patient will require inpatient hospital care spanning beyond 2 midnights from the point of admission due to high intensity of service, high risk for further deterioration and high frequency of surveillance required.*   Romesha Scherer M. DO [facility]  How to contact the [facility] Attending or Consulting provider [place] or covering provider during after  hours [place], for this patient?  Check the care team in [facility] and look for a) attending/consulting [facility] provider listed and b) the [facility] team listed Log into [email or web address]  Amion Physician Scheduling and messaging for groups and whole hospitals  On call and physician scheduling software for group practices, residents, hospitalists and other medical providers for call, clinic, rotation and shift schedules. OnCall [employer] is a hospital-wide system for scheduling doctors and paging doctors on call. EasyPlot is for scientific plotting and data analysis.  [email or web address]  and use [place]'s universal password to  access. If you do not have the password, please contact the hospital operator.  Locate the [facility] provider you are looking for under [facility] and page to a number that you can be directly reached. If you still have difficulty reaching the provider, please page the [facility] (Director on Call) for the Hospitalists listed on amion for assistance.  12/08/2020, 4:59 AM

## 2020-12-08 NOTE — ED Triage Notes (Signed)
Patient arrives complaining of SCC. Patient states pain in chest, which is her normal. Patient states pain since Monday.

## 2020-12-08 NOTE — ED Provider Notes (Signed)
[facility] DEPT Provider Note   CSN: [id number] Arrival date & time: 12/07/20  2314     History No chief complaint on file.   Molly Gutierrez is a 41 y.o. female.  Complains of chest pain, typical site for sickle cell crisis pain. History of acute chest and PE's, on Xarelto. No fever or cough. Reports nausea and vomiting, also typical of crisis. Symptoms started 4 days ago. Usual pain medications are not effective.   The history is provided by the patient. No language interpreter was used.      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   Sickle cell anemia with pain ([place]) 04/24/2020   Mild intermittent asthma 03/31/2020   Sickle cell anemia with crisis ([place]) 01/07/2020   Sickle cell crisis acute chest syndrome ([place]) 07/29/2019   Drug-seeking behavior 07/07/2019   Sinus tachycardia 07/07/2019   Therapeutic opioid-induced constipation (OIC) 07/07/2019   Breast mass in female 06/29/2019   Atelectasis of right lung 06/29/2019   Sickle cell crisis ([place]) 06/27/2019   Sickle cell pain crisis ([place]) 04/19/2019   Pneumonia 03/27/2019   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   Anterior chest wall pain 11/13/2018   Epigastric abdominal pain 11/13/2018   Hematuria 11/13/2018   Hyperbilirubinemia 11/12/2018   Long term current use of anticoagulant therapy 11/12/2018   History of pulmonary embolism 09/26/2018   Hx of pulmonary embolism ([place]) 09/26/2018   Opioid dependence ([place]) 09/13/2018   Port-A-Cath in place 09/13/2018   Narcotic abuse, continuous ([place]) 08/28/2018   Anemia 07/03/2018   Personal history of other venous thrombosis and embolism 07/03/2018   History of transfusion 07/03/2018    Gastro-esophageal reflux disease without esophagitis 06/02/2018   Asthma 05/19/2018   Anxiety and depression 10/30/2017   At risk for sepsis 06/13/2017   Mitral regurgitation 02/01/2014   Itching 12/13/2013   Nausea & vomiting 12/13/2013   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   Frequent complaints of pain 11/01/2013   Hypokalemia 09/19/2013   S/P total hip arthroplasty 05/19/2013   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, pelvic region and thigh 05/12/2013   Lower urinary tract infectious disease 03/02/2013   Chest pain 11/14/2012   Sickle-cell anemia ([place]) 10/30/2012   Pain management 08/01/2012   Reticulocytosis 07/31/2012   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     Family History  Problem Relation Age of Onset   Renal Disease Mother    Hypertension Mother    High Cholesterol Mother     Social History   Tobacco Use   Smoking status: Never   Smokeless tobacco: Never  Vaping Use   Vaping Use: Never used  Substance  Use Topics   Alcohol use: Never   Drug use: Never    Home Medications Prior to Admission medications   Medication Sig Start Date End Date Taking? Authorizing Provider  albuterol (VENTOLIN HFA) 108 (90 Base) MCG/ACT inhaler Inhale 2 puffs into the lungs 2 (two) times daily as needed for wheezing or shortness of breath. 04/13/20 04/13/21  Vevelyn Francois, NP  celecoxib (CELEBREX) 200 MG capsule Take 1 capsule (200 mg total) by mouth daily. 08/02/20 08/02/21  Vevelyn Francois, NP   Cholecalciferol (VITAMIN D3) 25 MCG (1000 UT) CAPS Take 1 capsule (1,000 Units total) by mouth daily. 04/13/20 04/13/21  Vevelyn Francois, NP  Deferasirox (JADENU) 360 MG TABS Take 3 tablets (1,080 mg total) by mouth daily before breakfast. 04/13/20 04/13/21  Vevelyn Francois, NP  diphenhydrAMINE (BENADRYL) 25 mg capsule Take 25 mg by mouth 3 (three) times daily as needed for itching.    [provider]  folic acid (FOLVITE) 1 MG tablet Take 1 tablet (1 mg total) by mouth daily. 04/13/20 04/13/21  Vevelyn Francois, NP  gabapentin (NEURONTIN) 300 MG capsule Take 1 capsule (300 mg total) by mouth 3 (three) times daily. 04/13/20 04/13/21  Vevelyn Francois, NP  HYDROmorphone (DILAUDID) 4 MG tablet Take 1 tablet (4 mg total) by mouth every 6 (six) hours as needed for up to 15 days for moderate pain or severe pain. 12/11/20 12/26/20  Vevelyn Francois, NP  mirtazapine (REMERON) 45 MG tablet Take 1 tablet (45 mg total) by mouth at bedtime. 04/13/20 04/13/21  Vevelyn Francois, NP  mometasone-formoterol (DULERA) 100-5 MCG/ACT AERO Inhale 2 puffs into the lungs daily as needed for wheezing or shortness of breath. 04/13/20 04/13/21  Vevelyn Francois, NP  omeprazole (PRILOSEC) 20 MG capsule Take 1 capsule (20 mg total) by mouth 2 (two) times daily before a meal. 08/02/20 08/02/21  King, Diona Foley, NP  OXBRYTA 500 MG TABS tablet Take 1,500 mg by mouth daily. 10/30/20   [provider]  polyethylene glycol (MIRALAX / GLYCOLAX) 17 g packet Take 17 g by mouth daily as needed for mild constipation ([place]).    [provider]  promethazine (PHENERGAN) 25 MG tablet Take 0.5-1 tablets (12.5-25 mg total) by mouth every 6 (six) hours as needed for nausea or vomiting. 10/25/20 10/25/21  Vevelyn Francois, NP  vitamin B-12 (CYANOCOBALAMIN) 1000 MCG tablet Take 1 tablet (1,000 mcg total) by mouth daily. 04/13/20 04/13/21  Vevelyn Francois, NP  XARELTO 20 MG TABS tablet Take 1 tablet (20 mg total) by mouth  daily with supper. 10/25/20 10/25/21  Vevelyn Francois, NP    Allergies    Cefaclor, Hydroxyurea, Omeprazole, and Ketamine  Review of Systems   Review of Systems  Constitutional:  Negative for chills and fever.  HENT: Negative.    Respiratory: Negative.  Negative for cough.   Cardiovascular:  Positive for chest pain.  Gastrointestinal:  Positive for nausea and vomiting.  Musculoskeletal: Negative.   Skin: Negative.   Neurological: Negative.    Physical Exam Updated Vital Signs There were no vitals taken for this visit.  Physical Exam Vitals and nursing note reviewed.  Constitutional:      Appearance: Normal appearance. She is well-developed.     Comments: Appears uncomfortable.   HENT:     Head: Normocephalic.  Eyes:     Conjunctiva/sclera: Conjunctivae normal.  Cardiovascular:     Rate and Rhythm: Normal rate and regular rhythm.  Heart sounds: No murmur heard. Pulmonary:     Effort: Pulmonary effort is normal.     Breath sounds: Normal breath sounds. No wheezing, rhonchi or rales.  Chest:     Chest wall: Tenderness present.  Abdominal:     General: Bowel sounds are normal.     Palpations: Abdomen is soft.     Tenderness: There is no abdominal tenderness. There is no guarding or rebound.  Musculoskeletal:        General: Normal range of motion.     Cervical back: Normal range of motion and neck supple.  Skin:    General: Skin is warm and dry.  Neurological:     General: No focal deficit present.     Mental Status: She is alert and oriented to person, place, and time.    ED Results / Procedures / Treatments   Labs (all labs ordered are listed, but only abnormal results are displayed) Labs Reviewed - No data to display  EKG None  Radiology No results found.  Procedures Procedures   Medications Ordered in ED Medications - No data to display  ED Course  I have reviewed the triage vital signs and the nursing notes.  Pertinent labs & imaging results  that were available during my care of the patient were reviewed by me and considered in my medical decision making (see chart for details).    MDM Rules/Calculators/A&P                          Patient to ED with chest pain, nausea, vomiting, c/w her previous sickle cell crises. No fever.   CXR clear. Labs reviewed. Hgb 6.9. patient reports her baseline is 8.5, usually 6.0 before transfusions required. Blood pressure 127/84, no tachycardia, considered hemodynamically stable. Will continue to observe for now.   Pain uncontrolled with protocol dosing. Additional dose provided, including IV benadryl.   Her reticulocyte count is low causing concern for further decline. Discussed admitting the patient for further management and she agrees with disposition. Hospitalist paged.   Final Clinical Impression(s) / ED Diagnoses Final diagnoses:  None   Sickle cell crisis Anemia    Rx / DC Orders ED Discharge Orders     None        Charlann Lange, PA-C 12/08/20 0254    Orpah Greek, MD 12/08/20 [phone]

## 2020-12-08 NOTE — ED Notes (Signed)
Patient transported to CT 

## 2020-12-08 NOTE — Progress Notes (Signed)
Selin [place] a 40 year old female with a medical history significant for sickle cell disease, chronic pain syndrome, opiate dependence and tolerance, frequent hospitalizations, history of PE, history of anemia of chronic disease, and history of mild intermittent asthma was admitted earlier this a.m. with sickle cell pain crisis. Patient has not identified any palliative or provocative factors concerning crisis.  She rates pain as 9/10.  She currently denies any headache, chest pain, urinary symptoms, nausea, vomiting, or diarrhea.  Care Plan: Decrease IVF to 0.45% saline at 75 ml/hr Decrease Toradol to 15 mg every 6 hours  Hemoglobin is 6.9, type and screen in am. If hemoglobin is below 6.0 g/dL, transfuse 1 unit PRBCs Will reassess in am  [place], MSN, FNP-C Patient [facility] [address] [place], [place] [address] [phone]

## 2020-12-09 DIAGNOSIS — R0789 Other chest pain: Secondary | ICD-10-CM

## 2020-12-09 DIAGNOSIS — F419 Anxiety disorder, unspecified: Secondary | ICD-10-CM | POA: Diagnosis not present

## 2020-12-09 DIAGNOSIS — Z765 Malingerer [conscious simulation]: Secondary | ICD-10-CM

## 2020-12-09 DIAGNOSIS — D5700 Hb-SS disease with crisis, unspecified: Secondary | ICD-10-CM

## 2020-12-09 DIAGNOSIS — G894 Chronic pain syndrome: Secondary | ICD-10-CM

## 2020-12-09 DIAGNOSIS — F32A Depression, unspecified: Secondary | ICD-10-CM

## 2020-12-09 DIAGNOSIS — D5701 Hb-SS disease with acute chest syndrome: Secondary | ICD-10-CM | POA: Diagnosis not present

## 2020-12-09 LAB — CBC WITH DIFFERENTIAL/PLATELET
Abs Immature Granulocytes: 0.16 10*3/uL — ABNORMAL HIGH (ref 0.00–0.07)
Basophils Absolute: 0.1 10*3/uL (ref 0.0–0.1)
Basophils Relative: 0 %
Eosinophils Absolute: 1.6 10*3/uL — ABNORMAL HIGH (ref 0.0–0.5)
Eosinophils Relative: 9 %
HCT: 18.2 % — ABNORMAL LOW (ref 36.0–46.0)
Hemoglobin: 6 g/dL — CL (ref 12.0–15.0)
Immature Granulocytes: 1 %
Lymphocytes Relative: 21 %
Lymphs Abs: 3.7 10*3/uL (ref 0.7–4.0)
MCH: 28.6 pg (ref 26.0–34.0)
MCHC: 33 g/dL (ref 30.0–36.0)
MCV: 86.7 fL (ref 80.0–100.0)
Monocytes Absolute: 2.6 10*3/uL — ABNORMAL HIGH (ref 0.1–1.0)
Monocytes Relative: 14 %
Neutro Abs: 9.7 10*3/uL — ABNORMAL HIGH (ref 1.7–7.7)
Neutrophils Relative %: 55 %
Platelets: 559 10*3/uL — ABNORMAL HIGH (ref 150–400)
RBC: 2.1 MIL/uL — ABNORMAL LOW (ref 3.87–5.11)
RDW: 20.5 % — ABNORMAL HIGH (ref 11.5–15.5)
WBC: 17.8 10*3/uL — ABNORMAL HIGH (ref 4.0–10.5)
nRBC: 0.2 % (ref 0.0–0.2)

## 2020-12-09 LAB — BASIC METABOLIC PANEL
Anion gap: 7 (ref 5–15)
BUN: 7 mg/dL (ref 6–20)
CO2: 23 mmol/L (ref 22–32)
Calcium: 8.6 mg/dL — ABNORMAL LOW (ref 8.9–10.3)
Chloride: 107 mmol/L (ref 98–111)
Creatinine, Ser: 0.66 mg/dL (ref 0.44–1.00)
GFR, Estimated: >60 mL/min (ref >60)
Glucose, Bld: 125 mg/dL — ABNORMAL HIGH (ref 70–99)
Potassium: 3.7 mmol/L (ref 3.5–5.1)
Sodium: 137 mmol/L (ref 135–145)

## 2020-12-09 LAB — HEMOGLOBIN AND HEMATOCRIT, BLOOD
HCT: 25.2 % — ABNORMAL LOW (ref 36.0–46.0)
Hemoglobin: 8.5 g/dL — ABNORMAL LOW (ref 12.0–15.0)

## 2020-12-09 LAB — PREPARE RBC (CROSSMATCH)

## 2020-12-09 NOTE — Progress Notes (Signed)
   12/09/20 1232  Assess: MEWS Score  Temp 98.1 F (36.7 C)  BP 125/81  Pulse Rate (!) 122  Resp 20  Level of Consciousness Alert  SpO2 97 %  O2 Device Room Air  Assess: MEWS Score  MEWS Temp 0  MEWS Systolic 0  MEWS Pulse 2  MEWS RR 0  MEWS LOC 0  MEWS Score 2  MEWS Score Color Yellow  Assess: if the MEWS score is Yellow or Red  Were vital signs taken at a resting state? Yes  Focused Assessment No change from prior assessment  Does the patient meet 2 or more of the SIRS criteria? No  MEWS guidelines implemented *See Row Information* Yes  Take Vital Signs  Increase Vital Sign Frequency  Yellow: Q 2hr X 2 then Q 4hr X 2, if remains yellow, continue Q 4hrs  Escalate  MEWS: Escalate Yellow: discuss with charge nurse/RN and consider discussing with provider and RRT  Notify: Charge Nurse/RN  Name of Charge Nurse/RN Notified Wendy, RN  Date Charge Nurse/RN Notified 12/09/20  Time Charge Nurse/RN Notified 1245  Document  Patient Outcome Stabilized after interventions  Progress note created (see row info) Yes  Assess: SIRS CRITERIA  SIRS Temperature  0  SIRS Pulse 1  SIRS Respirations  0  SIRS WBC 0  SIRS Score Sum  1   Pt in Yellow MEWS d/t increased HR. Agricultural consultant notified. Yellow MEWS implemented.

## 2020-12-09 NOTE — Progress Notes (Signed)
Patient ID: Molly Gutierrez, female   DOB: 16-Oct-1980, 40 y.o.   MRN: [id number] Subjective: Molly Gutierrez a 40 year old female with a medical history significant for sickle cell disease, chronic pain syndrome, opiate dependence and tolerance, frequent hospitalizations, history of PE, history of anemia of chronic disease, and history of mild intermittent asthma was admitted earlier this a.m. with sickle cell pain crisis.  Today patient claims she is still in significant amount of pain in her lower extremities and lower back.  She also complains of pain in her central chest area but no cough or shortness of breath.  Her hemoglobin has dropped to 6.0 today.  She has no urinary symptoms.  She complains of nausea but no vomiting or diarrhea.  She has no fever.  Objective:  Vital signs in last 24 hours:  Vitals:   12/09/20 0816 12/09/20 1210 12/09/20 1232 12/09/20 1242  BP:   125/81   Pulse:   (!) 122   Resp:  13 20 14   Temp:   98.1 F (36.7 C)   TempSrc:      SpO2: 98% 100% 97% 100%  Weight:      Height:        Intake/Output from previous day:   Intake/Output Summary (Last 24 hours) at 12/09/2020 1311 Last data filed at 12/09/2020 4034 Gross per 24 hour  Intake 1674 ml  Output --  Net 1674 ml    Physical Exam: General: Alert, awake, oriented x3, in no acute distress.  HEENT: [place]/AT PEERL, EOMI Neck: Trachea midline,  no masses, no thyromegal,y no JVD, no carotid bruit OROPHARYNX:  Moist, No exudate/ erythema/lesions.  Heart: Regular rate and rhythm, without murmurs, rubs, gallops, PMI non-displaced, no heaves or thrills on palpation.  Lungs: Clear to auscultation, no wheezing or rhonchi noted. No increased vocal fremitus resonant to percussion  Abdomen: Soft, nontender, nondistended, positive bowel sounds, no masses no hepatosplenomegaly noted..  Neuro: No focal neurological deficits noted cranial nerves II through XII grossly intact. DTRs 2+ bilaterally upper and lower  extremities. Strength 5 out of 5 in bilateral upper and lower extremities. Musculoskeletal: No warm swelling or erythema around joints, no spinal tenderness noted. Psychiatric: Patient alert and oriented x3, good insight and cognition, good recent to remote recall. Lymph node survey: No cervical axillary or inguinal lymphadenopathy noted.  Lab Results:  Basic Metabolic Panel:    Component Value Date/Time   NA 137 12/09/2020 1050   NA 137 08/02/2020 1538   K 3.7 12/09/2020 1050   CL 107 12/09/2020 1050   CO2 23 12/09/2020 1050   BUN 7 12/09/2020 1050   BUN 7 08/02/2020 1538   CREATININE 0.66 12/09/2020 1050   GLUCOSE 125 (H) 12/09/2020 1050   CALCIUM 8.6 (L) 12/09/2020 1050   CBC:    Component Value Date/Time   WBC 17.8 (H) 12/09/2020 1050   HGB 6.0 (LL) 12/09/2020 1050   HGB 7.8 (L) 08/02/2020 1538   HCT 18.2 (L) 12/09/2020 1050   HCT 22.3 (L) 08/02/2020 1538   PLT 559 (H) 12/09/2020 1050   PLT 560 (H) 08/02/2020 1538   MCV 86.7 12/09/2020 1050   MCV 92 08/02/2020 1538   NEUTROABS 9.7 (H) 12/09/2020 1050   NEUTROABS 9.5 (H) 08/02/2020 1538   LYMPHSABS 3.7 12/09/2020 1050   LYMPHSABS 3.0 08/02/2020 1538   MONOABS 2.6 (H) 12/09/2020 1050   EOSABS 1.6 (H) 12/09/2020 1050   EOSABS 0.4 08/02/2020 1538   BASOSABS 0.1 12/09/2020 1050   BASOSABS 0.1 08/02/2020 1538  Recent Results (from the past 240 hour(s))  Resp Panel by RT-PCR (Flu A&B, Covid) Nasopharyngeal Swab     Status: None   Collection Time: 12/08/20  2:55 AM   Specimen: Nasopharyngeal Swab; Nasopharyngeal(NP) swabs in vial transport medium  Result Value Ref Range Status   SARS Coronavirus 2 by RT PCR NEGATIVE NEGATIVE Final    Comment: (NOTE) SARS-CoV-2 target nucleic acids are NOT DETECTED.  The SARS-CoV-2 RNA is generally detectable in upper respiratory specimens during the acute phase of infection. The lowest concentration of SARS-CoV-2 viral copies this assay can detect is 138 copies/mL. A negative  result does not preclude SARS-Cov-2 infection and should not be used as the sole basis for treatment or other patient management decisions. A negative result may occur with  improper specimen collection/handling, submission of specimen other than nasopharyngeal swab, presence of viral mutation(s) within the areas targeted by this assay, and inadequate number of viral copies(<138 copies/mL). A negative result must be combined with clinical observations, patient history, and epidemiological information. The expected result is Negative.  Fact Sheet for Patients:  EntrepreneurPulse.com.au  Fact Sheet for Healthcare Providers:  IncredibleEmployment.be  This test is no t yet approved or cleared by the [place] FDA and  has been authorized for detection and/or diagnosis of SARS-CoV-2 by FDA under an Emergency Use Authorization (EUA). This EUA will remain  in effect (meaning this test can be used) for the duration of the COVID-19 declaration under Section 564(b)(1) of the Act, 21 U.S.C.section 360bbb-3(b)(1), unless the authorization is terminated  or revoked sooner.       Influenza A by PCR NEGATIVE NEGATIVE Final   Influenza B by PCR NEGATIVE NEGATIVE Final    Comment: (NOTE) The Xpert Xpress SARS-CoV-2/FLU/RSV plus assay is intended as an aid in the diagnosis of influenza from Nasopharyngeal swab specimens and should not be used as a sole basis for treatment. Nasal washings and aspirates are unacceptable for Xpert Xpress SARS-CoV-2/FLU/RSV testing.  Fact Sheet for Patients: EntrepreneurPulse.com.au  Fact Sheet for Healthcare Providers: IncredibleEmployment.be  This test is not yet approved or cleared by the [place] FDA and has been authorized for detection and/or diagnosis of SARS-CoV-2 by FDA under an Emergency Use Authorization (EUA). This EUA will remain in effect (meaning this test can be used)  for the duration of the COVID-19 declaration under Section 564(b)(1) of the Act, 21 U.S.C. section 360bbb-3(b)(1), unless the authorization is terminated or revoked.  Performed at [facility], [place] [address], [place], [place] [address]     Studies/Results: DG Chest 2 View  Result Date: 12/08/2020 CLINICAL DATA:  Chest pain.  Sickle cell. EXAM: CHEST - 2 VIEW COMPARISON:  Chest x-ray 11/22/2020, CT chest 01/07/2020, chest x-ray 11/07/2020 FINDINGS: Bilateral chest wall Port-A-Cath with left-side overlying the right atrium and right side overlying the superior cavoatrial junction. The heart size and mediastinal contours unchanged with persistent enlarged cardiac silhouette. No focal consolidation. Persistent coarsened interstitial markings with no overt pulmonary edema. No pleural effusion. No pneumothorax. No acute osseous abnormality.  Right upper quadrant surgical clips. IMPRESSION: No active cardiopulmonary disease. Electronically Signed   By: Iven Finn M.D.   On: 12/08/2020 00:43    Medications: Scheduled Meds:  Chlorhexidine Gluconate Cloth  6 each Topical Daily   Deferasirox  1,080 mg Oral QAC breakfast   folic acid  1 mg Oral Daily   gabapentin  300 mg Oral TID   HYDROmorphone   Intravenous Q4H   ketorolac  15  mg Intravenous Q6H   mirtazapine  45 mg Oral QHS   mometasone-formoterol  2 puff Inhalation BID   rivaroxaban  20 mg Oral Q supper   senna-docusate  1 tablet Oral BID   Continuous Infusions:  diphenhydrAMINE 25 mg (12/09/20 1242)   promethazine (PHENERGAN) injection (IM or IVPB) 12.5 mg (12/09/20 0914)   PRN Meds:.diphenhydrAMINE **OR** diphenhydrAMINE, naloxone **AND** sodium chloride flush, ondansetron (ZOFRAN) IV, polyethylene glycol, promethazine (PHENERGAN) injection (IM or IVPB)  Consultants: None  Procedures: None  Antibiotics: None  Assessment/Plan: Principal Problem:   Sickle cell pain crisis ([place]) Active Problems:    Anterior chest wall pain   Anxiety and depression   Drug-seeking behavior   Chronic pain syndrome  Hb Sickle Cell Disease with crisis: Reduce IVF to [facility], continue weight based Dilaudid PCA, continue IV Toradol 15 mg Q 6 H for total of 5 days, restart and continue oral home pain medications, monitor vitals very closely, Re-evaluate pain scale regularly, 2 L of Oxygen by [place]. Sickle Cell Anemia: Hemoglobin has dropped to 6.0 today which is more than 2 points below patient's baseline of 8.  We will transfuse her with 1 unit of packed red blood cells today.  We will repeat labs in AM. Chronic pain Syndrome: Patient is an over utilizer of ED and hospital admission.  Last hospital admission in this hospital was November 21, 2020, discharged 11/26/2020 which is 12 days ago and patient has been in ED 3 times since then and on the day of the current admission, patient was in 3 different emergency rooms 1 [facility] ED, then [facility] ED and later [facility] on the same day. History of major depression and anxiety: Continue her home medications.  Patient counseled extensively.  She denies any suicidal ideations or thoughts at this time. History of pulmonary embolism: Stable.  Continue Xarelto.  Code Status: Full Code Family Communication: N/A Disposition Plan: Not yet ready for discharge  Molly Gutierrez  If 7PM-7AM, please contact night-coverage.  12/09/2020, 1:11 PM  LOS: 1 day

## 2020-12-10 DIAGNOSIS — F419 Anxiety disorder, unspecified: Secondary | ICD-10-CM | POA: Diagnosis not present

## 2020-12-10 DIAGNOSIS — G894 Chronic pain syndrome: Secondary | ICD-10-CM | POA: Diagnosis not present

## 2020-12-10 DIAGNOSIS — D5701 Hb-SS disease with acute chest syndrome: Secondary | ICD-10-CM | POA: Diagnosis not present

## 2020-12-10 DIAGNOSIS — R0789 Other chest pain: Secondary | ICD-10-CM | POA: Diagnosis not present

## 2020-12-10 DIAGNOSIS — D5700 Hb-SS disease with crisis, unspecified: Secondary | ICD-10-CM | POA: Diagnosis not present

## 2020-12-10 LAB — TYPE AND SCREEN
ABO/RH(D): A POS
Antibody Screen: NEGATIVE
Unit division: 0

## 2020-12-10 LAB — BPAM RBC
Blood Product Expiration Date: 202207242359
ISSUE DATE / TIME: 202206251431
Unit Type and Rh: 9500

## 2020-12-10 MED ORDER — HYDROMORPHONE 1 MG/ML IV SOLN
INTRAVENOUS | Status: DC
  Administered 2020-12-10: 30 mg via INTRAVENOUS
  Filled 2020-12-10: qty 30

## 2020-12-10 MED ORDER — PROMETHAZINE HCL 25 MG PO TABS
25.0000 mg | ORAL_TABLET | Freq: Four times a day (QID) | ORAL | Status: DC | PRN
  Administered 2020-12-10: 25 mg via ORAL
  Filled 2020-12-10: qty 1

## 2020-12-10 NOTE — Progress Notes (Signed)
Patient ID: Molly Gutierrez, female   DOB: 08/25/1980, 40 y.o.   MRN: [id number] Subjective: Molly Gutierrez a 40 year old female with a medical history significant for sickle cell disease, chronic pain syndrome, opiate dependence and tolerance, frequent hospitalizations, history of PE, history of anemia of chronic disease, and history of mild intermittent asthma was admitted earlier this a.m. with sickle cell pain crisis.  Patient has no new complaint today.  She said her pain is at 7/10, constant and throbbing, mostly in her lower extremities and lower back.  She denies any other symptoms.  No fever.  Still nauseous but no vomiting.  She is ambulating well.  She received 1 unit of packed red blood cell yesterday with great improvement in her overall symptoms.  Objective:  Vital signs in last 24 hours:  Vitals:   12/10/20 0801 12/10/20 0820 12/10/20 0855 12/10/20 1206  BP: 106/67   108/73  Pulse: 94   97  Resp: 10 12  10   Temp: 98.1 F (36.7 C)   98.1 F (36.7 C)  TempSrc:      SpO2: 97% 100% 97% 97%  Weight:      Height:        Intake/Output from previous day:   Intake/Output Summary (Last 24 hours) at 12/10/2020 1307 Last data filed at 12/10/2020 0950 Gross per 24 hour  Intake 1509 ml  Output --  Net 1509 ml     Physical Exam: General: Alert, awake, oriented x3, in no acute distress.  HEENT: [place]/AT PEERL, EOMI Neck: Trachea midline,  no masses, no thyromegal,y no JVD, no carotid bruit OROPHARYNX:  Moist, No exudate/ erythema/lesions.  Heart: Regular rate and rhythm, without murmurs, rubs, gallops, PMI non-displaced, no heaves or thrills on palpation.  Lungs: Clear to auscultation, no wheezing or rhonchi noted. No increased vocal fremitus resonant to percussion  Abdomen: Soft, nontender, nondistended, positive bowel sounds, no masses no hepatosplenomegaly noted..  Neuro: No focal neurological deficits noted cranial nerves II through XII grossly intact. DTRs 2+  bilaterally upper and lower extremities. Strength 5 out of 5 in bilateral upper and lower extremities. Musculoskeletal: No warm swelling or erythema around joints, no spinal tenderness noted. Psychiatric: Patient alert and oriented x3, good insight and cognition, good recent to remote recall. Lymph node survey: No cervical axillary or inguinal lymphadenopathy noted.  Lab Results:  Basic Metabolic Panel:    Component Value Date/Time   NA 137 12/09/2020 1050   NA 137 08/02/2020 1538   K 3.7 12/09/2020 1050   CL 107 12/09/2020 1050   CO2 23 12/09/2020 1050   BUN 7 12/09/2020 1050   BUN 7 08/02/2020 1538   CREATININE 0.66 12/09/2020 1050   GLUCOSE 125 (H) 12/09/2020 1050   CALCIUM 8.6 (L) 12/09/2020 1050   CBC:    Component Value Date/Time   WBC 17.8 (H) 12/09/2020 1050   HGB 8.5 (L) 12/09/2020 2040   HGB 7.8 (L) 08/02/2020 1538   HCT 25.2 (L) 12/09/2020 2040   HCT 22.3 (L) 08/02/2020 1538   PLT 559 (H) 12/09/2020 1050   PLT 560 (H) 08/02/2020 1538   MCV 86.7 12/09/2020 1050   MCV 92 08/02/2020 1538   NEUTROABS 9.7 (H) 12/09/2020 1050   NEUTROABS 9.5 (H) 08/02/2020 1538   LYMPHSABS 3.7 12/09/2020 1050   LYMPHSABS 3.0 08/02/2020 1538   MONOABS 2.6 (H) 12/09/2020 1050   EOSABS 1.6 (H) 12/09/2020 1050   EOSABS 0.4 08/02/2020 1538   BASOSABS 0.1 12/09/2020 1050   BASOSABS 0.1 08/02/2020  1538    Recent Results (from the past 240 hour(s))  Resp Panel by RT-PCR (Flu A&B, Covid) Nasopharyngeal Swab     Status: None   Collection Time: 12/08/20  2:55 AM   Specimen: Nasopharyngeal Swab; Nasopharyngeal(NP) swabs in vial transport medium  Result Value Ref Range Status   SARS Coronavirus 2 by RT PCR NEGATIVE NEGATIVE Final    Comment: (NOTE) SARS-CoV-2 target nucleic acids are NOT DETECTED.  The SARS-CoV-2 RNA is generally detectable in upper respiratory specimens during the acute phase of infection. The lowest concentration of SARS-CoV-2 viral copies this assay can detect  is 138 copies/mL. A negative result does not preclude SARS-Cov-2 infection and should not be used as the sole basis for treatment or other patient management decisions. A negative result may occur with  improper specimen collection/handling, submission of specimen other than nasopharyngeal swab, presence of viral mutation(s) within the areas targeted by this assay, and inadequate number of viral copies(<138 copies/mL). A negative result must be combined with clinical observations, patient history, and epidemiological information. The expected result is Negative.  Fact Sheet for Patients:  EntrepreneurPulse.com.au  Fact Sheet for Healthcare Providers:  IncredibleEmployment.be  This test is no t yet approved or cleared by the [place] FDA and  has been authorized for detection and/or diagnosis of SARS-CoV-2 by FDA under an Emergency Use Authorization (EUA). This EUA will remain  in effect (meaning this test can be used) for the duration of the COVID-19 declaration under Section 564(b)(1) of the Act, 21 U.S.C.section 360bbb-3(b)(1), unless the authorization is terminated  or revoked sooner.       Influenza A by PCR NEGATIVE NEGATIVE Final   Influenza B by PCR NEGATIVE NEGATIVE Final    Comment: (NOTE) The Xpert Xpress SARS-CoV-2/FLU/RSV plus assay is intended as an aid in the diagnosis of influenza from Nasopharyngeal swab specimens and should not be used as a sole basis for treatment. Nasal washings and aspirates are unacceptable for Xpert Xpress SARS-CoV-2/FLU/RSV testing.  Fact Sheet for Patients: EntrepreneurPulse.com.au  Fact Sheet for Healthcare Providers: IncredibleEmployment.be  This test is not yet approved or cleared by the [place] FDA and has been authorized for detection and/or diagnosis of SARS-CoV-2 by FDA under an Emergency Use Authorization (EUA). This EUA will remain in effect  (meaning this test can be used) for the duration of the COVID-19 declaration under Section 564(b)(1) of the Act, 21 U.S.C. section 360bbb-3(b)(1), unless the authorization is terminated or revoked.  Performed at [facility], [place] [address] [place],  [address]     Studies/Results: No results found.  Medications: Scheduled Meds:  Chlorhexidine Gluconate Cloth  6 each Topical Daily   Deferasirox  1,080 mg Oral QAC breakfast   folic acid  1 mg Oral Daily   gabapentin  300 mg Oral TID   HYDROmorphone   Intravenous Q4H   ketorolac  15 mg Intravenous Q6H   mirtazapine  45 mg Oral QHS   mometasone-formoterol  2 puff Inhalation BID   rivaroxaban  20 mg Oral Q supper   senna-docusate  1 tablet Oral BID   Continuous Infusions:  diphenhydrAMINE 25 mg (12/10/20 1112)   promethazine (PHENERGAN) injection (IM or IVPB) 12.5 mg (12/10/20 0505)   PRN Meds:.diphenhydrAMINE **OR** diphenhydrAMINE, naloxone **AND** sodium chloride flush, ondansetron (ZOFRAN) IV, polyethylene glycol, promethazine (PHENERGAN) injection (IM or IVPB)  Consultants: None  Procedures: None  Antibiotics: None  Assessment/Plan: Principal Problem:   Sickle cell pain crisis ([place]) Active Problems:  Anterior chest wall pain   Anxiety and depression   Drug-seeking behavior   Chronic pain syndrome  Hb Sickle Cell Disease with crisis: Continue IV fluid at [facility], begin to wean weight based Dilaudid PCA, continue IV Toradol 15 mg Q 6 H for total of 5 days, continue oral home pain medications, monitor vitals very closely, Re-evaluate pain scale regularly, 2 L of Oxygen by [place]. Sickle Cell Anemia: Hemoglobin improved to 8.5 status post transfusion of 1 unit of packed red blood cell.  Patient remains asymptomatic.  We will continue to monitor closely. Chronic pain Syndrome: Patient is an over utilizer of ED and hospital admissions.  Patient was counseled extensively about nonpharmacologic means of  pain management and also educated on consequences of multiple ED Visits at different hospitals. History of major depression and anxiety: Continue her home medications.  Patient counseled extensively.  She denies any suicidal ideations or thoughts at this time. History of pulmonary embolism: Stable. Continue Xarelto.  Code Status: Full Code Family Communication: N/A Disposition Plan: Not yet ready for discharge  Jakim Drapeau  If 7PM-7AM, please contact night-coverage.  12/10/2020, 1:07 PM  LOS: 2 days

## 2020-12-11 DIAGNOSIS — D5700 Hb-SS disease with crisis, unspecified: Secondary | ICD-10-CM | POA: Diagnosis not present

## 2020-12-11 DIAGNOSIS — D5701 Hb-SS disease with acute chest syndrome: Secondary | ICD-10-CM | POA: Diagnosis not present

## 2020-12-11 LAB — CBC WITH DIFFERENTIAL/PLATELET
Abs Immature Granulocytes: 0.26 10*3/uL — ABNORMAL HIGH (ref 0.00–0.07)
Basophils Absolute: 0.1 10*3/uL (ref 0.0–0.1)
Basophils Relative: 1 %
Eosinophils Absolute: 1.1 10*3/uL — ABNORMAL HIGH (ref 0.0–0.5)
Eosinophils Relative: 6 %
HCT: 24 % — ABNORMAL LOW (ref 36.0–46.0)
Hemoglobin: 8 g/dL — ABNORMAL LOW (ref 12.0–15.0)
Immature Granulocytes: 1 %
Lymphocytes Relative: 21 %
Lymphs Abs: 4.1 10*3/uL — ABNORMAL HIGH (ref 0.7–4.0)
MCH: 29.3 pg (ref 26.0–34.0)
MCHC: 33.3 g/dL (ref 30.0–36.0)
MCV: 87.9 fL (ref 80.0–100.0)
Monocytes Absolute: 2.4 10*3/uL — ABNORMAL HIGH (ref 0.1–1.0)
Monocytes Relative: 12 %
Neutro Abs: 11.6 10*3/uL — ABNORMAL HIGH (ref 1.7–7.7)
Neutrophils Relative %: 59 %
Platelets: 591 10*3/uL — ABNORMAL HIGH (ref 150–400)
RBC: 2.73 MIL/uL — ABNORMAL LOW (ref 3.87–5.11)
RDW: 20.2 % — ABNORMAL HIGH (ref 11.5–15.5)
WBC: 19.5 10*3/uL — ABNORMAL HIGH (ref 4.0–10.5)
nRBC: 0.5 % — ABNORMAL HIGH (ref 0.0–0.2)

## 2020-12-11 LAB — BASIC METABOLIC PANEL
Anion gap: 5 (ref 5–15)
BUN: 8 mg/dL (ref 6–20)
CO2: 25 mmol/L (ref 22–32)
Calcium: 8.2 mg/dL — ABNORMAL LOW (ref 8.9–10.3)
Chloride: 108 mmol/L (ref 98–111)
Creatinine, Ser: 0.54 mg/dL (ref 0.44–1.00)
GFR, Estimated: >60 mL/min (ref >60)
Glucose, Bld: 96 mg/dL (ref 70–99)
Potassium: 4 mmol/L (ref 3.5–5.1)
Sodium: 138 mmol/L (ref 135–145)

## 2020-12-11 MED ORDER — SODIUM CHLORIDE 0.9% FLUSH: 10.0000 mL | INTRAVENOUS | Status: DC | PRN

## 2020-12-11 MED ORDER — HEPARIN SOD (PORK) LOCK FLUSH 100 UNIT/ML IV SOLN
500.0000 [IU] | INTRAVENOUS | Status: AC | PRN
  Administered 2020-12-11: 500 [IU]
  Filled 2020-12-11: qty 5

## 2020-12-11 MED ORDER — SODIUM CHLORIDE 0.9% FLUSH
10.0000 mL | Freq: Two times a day (BID) | INTRAVENOUS | Status: DC
  Administered 2020-12-11: 10 mL

## 2020-12-11 MED ORDER — HYDROMORPHONE HCL 2 MG PO TABS: 4.0000 mg | ORAL_TABLET | Freq: Four times a day (QID) | ORAL | Status: DC | PRN

## 2020-12-11 MED ORDER — HYDROMORPHONE HCL 4 MG PO TABS: 4.0000 mg | ORAL_TABLET | Freq: Four times a day (QID) | ORAL | 0 refills | Status: DC | PRN

## 2020-12-11 NOTE — Discharge Summary (Signed)
Physician Discharge Summary  [facility] GYI:[id number] DOB: 1980-08-09 DOA: 12/08/2020  PCP: Vevelyn Francois, NP  Admit date: 12/08/2020  Discharge date: 12/11/2020  Discharge Diagnoses:  Principal Problem:   Sickle cell pain crisis [facility]) Active Problems:   Anterior chest wall pain   Anxiety and depression   Drug-seeking behavior   Chronic pain syndrome   Discharge Condition: Stable  Disposition:  Pt is discharged home in good condition and is to follow up with Vevelyn Francois, NP this week to have labs evaluated. Molly Gutierrez is instructed to increase activity slowly and balance with rest for the next few days, and use prescribed medication to complete treatment of pain  Diet: Regular Wt Readings from Last 3 Encounters:  12/08/20 56.9 kg  11/21/20 57.2 kg  10/09/20 61 kg    History of present illness:  Molly Gutierrez is a 40 year old female with a medical history significant for sickle cell anemia, chronic pain syndrome, opiate dependence and tolerance, history of PE, and history of anemia of chronic disease that presented to the ER with complaints of sickle cell pain crisis.  Pain is primarily in chest, typical of sickle cell symptoms.  Usual home pain medications not effective, pain is severe and worsening.  No fever or cough. ER course: Hemoglobin 6.9, patient typically gets transfused at 6.0 g/dL.  Her pain persists despite IV Dilaudid in the ER, patient admitted to [facility] for further management of pain crisis.  Hospital Course:  Sickle cell disease with pain crisis: Patient was admitted for sickle cell pain crisis and managed appropriately with IVF, IV Dilaudid via PCA and IV Toradol, as well as other adjunct therapies per sickle cell pain management protocols.  IV Dilaudid PCA weaned appropriately.  Patient transition to home medications.  Dilaudid 4 mg every 6 hours for severe breakthrough pain #60 was sent to patient's pharmacy.  Reviewed PDMP  prior to prescribing opiate medications, no inconsistencies noted. Pain intensity decreased to 6/10.  Patient can manage at home on current medication regimen.  She will be discharged in stable condition.  Sickle cell anemia: Patient's baseline hemoglobin is 8-9 g/dL.  During admission, hemoglobin decreased below baseline.  Patient was transfused 1 unit PRBCs without complication.  Advised to follow-up with PCP in 2 weeks to repeat CBC with differential and CMP. Discussed adding disease modifying agents to patient's daily sickle cell management.  She was previously started on Oxbryta, but has not picked that medication.  Discussed the importance of taking medications consistently in order to achieve positive outcomes.  Patient expressed understanding.  History of PE on Xarelto: Medication continue without interruption.  No signs of bleeding noted.  Patient is alert, oriented, and ambulating without assistance.  No headache, dizziness, chest pains, or shortness of breath.   Patient was therefore discharged home today in a hemodynamically stable condition.   Aline will follow-up with PCP within 1 week of this discharge. Windie was counseled extensively about nonpharmacologic means of pain management, patient verbalized understanding and was appreciative of  the care received during this admission.   We discussed the need for good hydration, monitoring of hydration status, avoidance of heat, cold, stress, and infection triggers. We discussed the need to be adherent with taking Hydrea and other home medications. Patient was reminded of the need to seek medical attention immediately if any symptom of bleeding, anemia, or infection occurs.  Discharge Exam: Vitals:   12/11/20 0405 12/11/20 0458  BP:  111/74  Pulse:  87  Resp: 11  13  Temp:  98.9 F (37.2 C)  SpO2: 99% 98%   Vitals:   12/10/20 2018 12/11/20 0007 12/11/20 0405 12/11/20 0458  BP: 115/80   111/74  Pulse: (!) 101 96  87   Resp: 20 12 11 13   Temp: 99.4 F (37.4 C)   98.9 F (37.2 C)  TempSrc: Oral   Oral  SpO2: 98% 98% 99% 98%  Weight:      Height:        General appearance : Awake, alert, not in any distress. Speech Clear. Not toxic looking HEENT: Atraumatic and Normocephalic, pupils equally reactive to light and accomodation Neck: Supple, no JVD. No cervical lymphadenopathy.  Chest: Good air entry bilaterally, no added sounds  CVS: S1 S2 regular, no murmurs.  Abdomen: Bowel sounds present, Non tender and not distended with no guarding rigidity or rebound. Extremities: B/L Lower Ext shows no edema, both legs are warm to touch Neurology: Awake alert, and oriented X 3, CN II-XII intact, Non focal Skin: No Rash  Discharge Instructions   Allergies as of 12/11/2020       Reactions   Cefaclor Hives, Swelling   Hydroxyurea Palpitations, Other (See Comments)   Lowers "blood levels" and heart rate (causes HYPOtension); "it messes me up, it drops my levels and stuff"   Omeprazole Anaphylaxis, Other (See Comments)   Causes "sharp pains in the stomach"   Ketamine Palpitations, Other (See Comments)   "Pt states she has had previous reaction to ketamine. States she becomes flushed, heart races, dizzy, and feels like she is going to pass out."        Medication List     STOP taking these medications    celecoxib 200 MG capsule Commonly known as: CELEBREX       TAKE these medications    albuterol 108 (90 Base) MCG/ACT inhaler Commonly known as: VENTOLIN HFA Inhale 2 puffs into the lungs 2 (two) times daily as needed for wheezing or shortness of breath.   Deferasirox 360 MG Tabs Commonly known as: Jadenu Take 3 tablets (1,080 mg total) by mouth daily before breakfast.   diphenhydrAMINE 25 mg capsule Commonly known as: BENADRYL Take 25 mg by mouth 3 (three) times daily as needed for itching.   folic acid 1 MG tablet Commonly known as: FOLVITE Take 1 tablet (1 mg total) by mouth daily.    gabapentin 300 MG capsule Commonly known as: NEURONTIN Take 1 capsule (300 mg total) by mouth 3 (three) times daily.   HYDROmorphone 4 MG tablet Commonly known as: DILAUDID Take 1 tablet (4 mg total) by mouth every 6 (six) hours as needed for up to 15 days for moderate pain or severe pain.   mirtazapine 45 MG tablet Commonly known as: REMERON Take 1 tablet (45 mg total) by mouth at bedtime.   mometasone-formoterol 100-5 MCG/ACT Aero Commonly known as: DULERA Inhale 2 puffs into the lungs daily as needed for wheezing or shortness of breath.   polyethylene glycol 17 g packet Commonly known as: MIRALAX / GLYCOLAX Take 17 g by mouth daily as needed for mild constipation (MIX AND DRINK).   promethazine 25 MG tablet Commonly known as: PHENERGAN Take 0.5-1 tablets (12.5-25 mg total) by mouth every 6 (six) hours as needed for nausea or vomiting.   vitamin B-12 1000 MCG tablet Commonly known as: CYANOCOBALAMIN Take 1 tablet (1,000 mcg total) by mouth daily.   Vitamin D3 25 MCG (1000 UT) Caps Take 1 capsule (1,000 Units total) by  mouth daily.   Xarelto 20 MG Tabs tablet Generic drug: rivaroxaban Take 1 tablet (20 mg total) by mouth daily with supper.       ASK your doctor about these medications    omeprazole 20 MG capsule Commonly known as: PRILOSEC Take 1 capsule (20 mg total) by mouth 2 (two) times daily before a meal.   Oxbryta 500 MG Tabs tablet Generic drug: voxelotor Take 1,500 mg by mouth daily.        The results of significant diagnostics from this hospitalization (including imaging, microbiology, ancillary and laboratory) are listed below for reference.    Significant Diagnostic Studies: DG Chest 2 View  Result Date: 12/08/2020 CLINICAL DATA:  Chest pain.  Sickle cell. EXAM: CHEST - 2 VIEW COMPARISON:  Chest x-ray 11/22/2020, CT chest 01/07/2020, chest x-ray 11/07/2020 FINDINGS: Bilateral chest wall Port-A-Cath with left-side overlying the right atrium and  right side overlying the superior cavoatrial junction. The heart size and mediastinal contours unchanged with persistent enlarged cardiac silhouette. No focal consolidation. Persistent coarsened interstitial markings with no overt pulmonary edema. No pleural effusion. No pneumothorax. No acute osseous abnormality.  Right upper quadrant surgical clips. IMPRESSION: No active cardiopulmonary disease. Electronically Signed   By: Iven Finn M.D.   On: 12/08/2020 00:43   DG Chest 2 View  Result Date: 11/22/2020 CLINICAL DATA:  Sickle cell disease, acute chest syndrome EXAM: CHEST - 2 VIEW COMPARISON:  11/07/2020 FINDINGS: Bibasilar opacification is accentuated by overlying soft tissue and slight underpenetration on this examination. Despite this, patchy infiltrate is present within the lung bases bilaterally, new since prior examination. No pneumothorax or pleural effusion. Cardiac size is mildly enlarged. Bilateral internal jugular chest port are in place with tips at the superior cavoatrial junction and within the mid right atrium. The pulmonary vascularity is normal. No acute bone abnormality. IMPRESSION: Interval development of mild patchy bibasilar pulmonary infiltrate. Stable cardiomegaly. Electronically Signed   By: Fidela Salisbury MD   On: 11/22/2020 20:01    Microbiology: Recent Results (from the past 240 hour(s))  Resp Panel by RT-PCR (Flu A&B, Covid) Nasopharyngeal Swab     Status: None   Collection Time: 12/08/20  2:55 AM   Specimen: Nasopharyngeal Swab; Nasopharyngeal(NP) swabs in vial transport medium  Result Value Ref Range Status   SARS Coronavirus 2 by RT PCR NEGATIVE NEGATIVE Final    Comment: (NOTE) SARS-CoV-2 target nucleic acids are NOT DETECTED.  The SARS-CoV-2 RNA is generally detectable in upper respiratory specimens during the acute phase of infection. The lowest concentration of SARS-CoV-2 viral copies this assay can detect is 138 copies/mL. A negative result does not  preclude SARS-Cov-2 infection and should not be used as the sole basis for treatment or other patient management decisions. A negative result may occur with  improper specimen collection/handling, submission of specimen other than nasopharyngeal swab, presence of viral mutation(s) within the areas targeted by this assay, and inadequate number of viral copies(<138 copies/mL). A negative result must be combined with clinical observations, patient history, and epidemiological information. The expected result is Negative.  Fact Sheet for Patients:  EntrepreneurPulse.com.au  Fact Sheet for Healthcare Providers:  IncredibleEmployment.be  This test is no t yet approved or cleared by the [place] FDA and  has been authorized for detection and/or diagnosis of SARS-CoV-2 by FDA under an Emergency Use Authorization (EUA). This EUA will remain  in effect (meaning this test can be used) for the duration of the COVID-19 declaration under Section 564(b)(1) of the  Act, 21 U.S.C.section 360bbb-3(b)(1), unless the authorization is terminated  or revoked sooner.       Influenza A by PCR NEGATIVE NEGATIVE Final   Influenza B by PCR NEGATIVE NEGATIVE Final    Comment: (NOTE) The Xpert Xpress SARS-CoV-2/FLU/RSV plus assay is intended as an aid in the diagnosis of influenza from Nasopharyngeal swab specimens and should not be used as a sole basis for treatment. Nasal washings and aspirates are unacceptable for Xpert Xpress SARS-CoV-2/FLU/RSV testing.  Fact Sheet for Patients: EntrepreneurPulse.com.au  Fact Sheet for Healthcare Providers: IncredibleEmployment.be  This test is not yet approved or cleared by the [place] FDA and has been authorized for detection and/or diagnosis of SARS-CoV-2 by FDA under an Emergency Use Authorization (EUA). This EUA will remain in effect (meaning this test can be used) for the  duration of the COVID-19 declaration under Section 564(b)(1) of the Act, 21 U.S.C. section 360bbb-3(b)(1), unless the authorization is terminated or revoked.  Performed at [facility], [place] [address], [place], [place] [address]      Labs: Basic Metabolic Panel: Recent Labs  Lab 12/08/20 0100 12/09/20 1050 12/11/20 0553  NA 138 137 138  K 3.7 3.7 4.0  CL 107 107 108  CO2 23 23 25   GLUCOSE 88 125* 96  BUN 11 7 8   CREATININE 0.68 0.66 0.54  CALCIUM 9.0 8.6* 8.2*   Liver Function Tests: Recent Labs  Lab 12/08/20 0100  AST 30  ALT 12  ALKPHOS 48  BILITOT 2.9*  PROT 7.8  ALBUMIN 4.5   No results for input(s): LIPASE, AMYLASE in the last 168 hours. No results for input(s): AMMONIA in the last 168 hours. CBC: Recent Labs  Lab 12/08/20 0100 12/09/20 1050 12/09/20 2040 12/11/20 0553  WBC 15.0* 17.8*  --  19.5*  NEUTROABS 8.3* 9.7*  --  11.6*  HGB 6.9* 6.0* 8.5* 8.0*  HCT 20.7* 18.2* 25.2* 24.0*  MCV 85.5 86.7  --  87.9  PLT 651* 559*  --  591*   Cardiac Enzymes: No results for input(s): CKTOTAL, CKMB, CKMBINDEX, TROPONINI in the last 168 hours. BNP: Invalid input(s): POCBNP CBG: No results for input(s): GLUCAP in the last 168 hours.  Time coordinating discharge: 35 minutes  Signed:  [name]  APRN, MSN, FNP-C [facility] [address] [place],  [address] [phone]  [facility] 12/11/2020, 10:07 AM

## 2020-12-11 NOTE — Care Management Important Message (Signed)
Important Message  Patient Details IM Letter given to the Patient. Name: Molly Gutierrez MRN: [id number] Date of Birth: 04-15-1981   Medicare Important Message Given:  Yes     Kerin Salen 12/11/2020, 2:45 PM

## 2020-12-20 ENCOUNTER — Telehealth: Payer: Self-pay

## 2020-12-20 NOTE — Telephone Encounter (Signed)
Hydromorphone 4 mg [facility] on [address] in [place]

## 2020-12-21 ENCOUNTER — Other Ambulatory Visit: Payer: Self-pay | Admitting: Nurse Practitioner

## 2020-12-21 MED ORDER — HYDROMORPHONE HCL 4 MG PO TABS: 4.0000 mg | ORAL_TABLET | Freq: Four times a day (QID) | ORAL | 0 refills | Status: DC | PRN

## 2021-01-02 ENCOUNTER — Telehealth: Payer: Self-pay

## 2021-01-02 NOTE — Telephone Encounter (Signed)
Hydromorphone  

## 2021-01-03 ENCOUNTER — Inpatient Hospital Stay (HOSPITAL_COMMUNITY)
Admission: EM | Admit: 2021-01-03 | Discharge: 2021-01-07 | DRG: 812 | Disposition: A | Discharge status: Home / Self Care | Payer: Medicare Other | Attending: Internal Medicine | Admitting: Internal Medicine

## 2021-01-03 ENCOUNTER — Emergency Department (HOSPITAL_COMMUNITY): Payer: Medicare Other

## 2021-01-03 ENCOUNTER — Encounter (HOSPITAL_COMMUNITY): Payer: Self-pay

## 2021-01-03 ENCOUNTER — Other Ambulatory Visit: Payer: Self-pay

## 2021-01-03 DIAGNOSIS — G894 Chronic pain syndrome: Secondary | ICD-10-CM | POA: Diagnosis not present

## 2021-01-03 DIAGNOSIS — Z83438 Family history of other disorder of lipoprotein metabolism and other lipidemia: Secondary | ICD-10-CM

## 2021-01-03 DIAGNOSIS — F1120 Opioid dependence, uncomplicated: Secondary | ICD-10-CM | POA: Diagnosis present

## 2021-01-03 DIAGNOSIS — Z881 Allergy status to other antibiotic agents status: Secondary | ICD-10-CM

## 2021-01-03 DIAGNOSIS — D5700 Hb-SS disease with crisis, unspecified: Principal | ICD-10-CM | POA: Diagnosis present

## 2021-01-03 DIAGNOSIS — Z79899 Other long term (current) drug therapy: Secondary | ICD-10-CM

## 2021-01-03 DIAGNOSIS — Z841 Family history of disorders of kidney and ureter: Secondary | ICD-10-CM

## 2021-01-03 DIAGNOSIS — D638 Anemia in other chronic diseases classified elsewhere: Secondary | ICD-10-CM | POA: Diagnosis not present

## 2021-01-03 DIAGNOSIS — Z7951 Long term (current) use of inhaled steroids: Secondary | ICD-10-CM

## 2021-01-03 DIAGNOSIS — Z888 Allergy status to other drugs, medicaments and biological substances status: Secondary | ICD-10-CM

## 2021-01-03 DIAGNOSIS — Z20822 Contact with and (suspected) exposure to covid-19: Secondary | ICD-10-CM | POA: Diagnosis not present

## 2021-01-03 DIAGNOSIS — Z7901 Long term (current) use of anticoagulants: Secondary | ICD-10-CM

## 2021-01-03 DIAGNOSIS — E876 Hypokalemia: Secondary | ICD-10-CM | POA: Diagnosis not present

## 2021-01-03 DIAGNOSIS — J4520 Mild intermittent asthma, uncomplicated: Secondary | ICD-10-CM | POA: Diagnosis not present

## 2021-01-03 DIAGNOSIS — J45909 Unspecified asthma, uncomplicated: Secondary | ICD-10-CM | POA: Diagnosis present

## 2021-01-03 DIAGNOSIS — Z96649 Presence of unspecified artificial hip joint: Secondary | ICD-10-CM | POA: Diagnosis not present

## 2021-01-03 DIAGNOSIS — Z86711 Personal history of pulmonary embolism: Secondary | ICD-10-CM | POA: Diagnosis not present

## 2021-01-03 DIAGNOSIS — K219 Gastro-esophageal reflux disease without esophagitis: Secondary | ICD-10-CM | POA: Diagnosis not present

## 2021-01-03 DIAGNOSIS — Z8249 Family history of ischemic heart disease and other diseases of the circulatory system: Secondary | ICD-10-CM

## 2021-01-03 DIAGNOSIS — D72829 Elevated white blood cell count, unspecified: Secondary | ICD-10-CM | POA: Diagnosis present

## 2021-01-03 NOTE — ED Notes (Signed)
PT request labs come from port

## 2021-01-03 NOTE — ED Triage Notes (Signed)
Pt c/o chest pain that started 2 days ago.  Pt reports she feels this is a sickle cell crisis event and her po hydromorphone is not working at home. Pt denies SHOB.  Pt reports nausea, but no vomiting.

## 2021-01-03 NOTE — ED Provider Notes (Signed)
Emergency Medicine Provider Triage Evaluation Note  [facility] , a 40 y.o. female  was evaluated in triage.  Pt complains of chest pain.  Patient has a history of sickle cell anemia as well as acute chest syndrome.  She states that for the past 2 to 3 days she has been experiencing chest pain.  She is been taking her hydromorphone at home but states that due to the limited amount of this medication she has been taking it less frequently and her pain has continued to worsen.  Denies any shortness of breath.  Notes some mild nausea without vomiting.  Patient states that she was hospitalized with multiple issues when she was diagnosed with acute chest and is unsure if her current pain is consistent with prior acute chest syndrome.  Physical Exam  BP 94/72 (BP Location: Left Arm)   Pulse 93   Temp 97.8 F (36.6 C) (Oral)   Resp 18   Ht 5\' 3"  (1.6 m)   Wt 56.7 kg   LMP 12/09/2020 (Within Days)   SpO2 97%   BMI 22.14 kg/m  Gen:   Awake and alert Resp:  Normal effort  MSK:   Moves extremities without difficulty  Other:    Medical Decision Making  Medically screening exam initiated at 9:43 PM.  Appropriate orders placed.  [facility] was informed that the remainder of the evaluation will be completed by another provider, this initial triage assessment does not replace that evaluation, and the importance of remaining in the ED until their evaluation is complete.     Rayna Sexton, PA-C 01/03/21 2145    Hayden Rasmussen, MD 01/04/21 1029

## 2021-01-04 DIAGNOSIS — Z83438 Family history of other disorder of lipoprotein metabolism and other lipidemia: Secondary | ICD-10-CM | POA: Diagnosis not present

## 2021-01-04 DIAGNOSIS — Z79899 Other long term (current) drug therapy: Secondary | ICD-10-CM | POA: Diagnosis not present

## 2021-01-04 DIAGNOSIS — D5700 Hb-SS disease with crisis, unspecified: Secondary | ICD-10-CM | POA: Diagnosis present

## 2021-01-04 DIAGNOSIS — Z20822 Contact with and (suspected) exposure to covid-19: Secondary | ICD-10-CM | POA: Diagnosis not present

## 2021-01-04 DIAGNOSIS — E876 Hypokalemia: Secondary | ICD-10-CM | POA: Diagnosis not present

## 2021-01-04 DIAGNOSIS — F1120 Opioid dependence, uncomplicated: Secondary | ICD-10-CM | POA: Diagnosis not present

## 2021-01-04 DIAGNOSIS — Z7901 Long term (current) use of anticoagulants: Secondary | ICD-10-CM | POA: Diagnosis not present

## 2021-01-04 DIAGNOSIS — G894 Chronic pain syndrome: Secondary | ICD-10-CM | POA: Diagnosis not present

## 2021-01-04 DIAGNOSIS — D638 Anemia in other chronic diseases classified elsewhere: Secondary | ICD-10-CM | POA: Diagnosis not present

## 2021-01-04 DIAGNOSIS — K219 Gastro-esophageal reflux disease without esophagitis: Secondary | ICD-10-CM | POA: Diagnosis not present

## 2021-01-04 DIAGNOSIS — Z881 Allergy status to other antibiotic agents status: Secondary | ICD-10-CM | POA: Diagnosis not present

## 2021-01-04 DIAGNOSIS — Z96649 Presence of unspecified artificial hip joint: Secondary | ICD-10-CM | POA: Diagnosis not present

## 2021-01-04 DIAGNOSIS — Z8249 Family history of ischemic heart disease and other diseases of the circulatory system: Secondary | ICD-10-CM | POA: Diagnosis not present

## 2021-01-04 DIAGNOSIS — Z7951 Long term (current) use of inhaled steroids: Secondary | ICD-10-CM | POA: Diagnosis not present

## 2021-01-04 DIAGNOSIS — D72829 Elevated white blood cell count, unspecified: Secondary | ICD-10-CM | POA: Diagnosis not present

## 2021-01-04 DIAGNOSIS — J4520 Mild intermittent asthma, uncomplicated: Secondary | ICD-10-CM | POA: Diagnosis not present

## 2021-01-04 DIAGNOSIS — Z86711 Personal history of pulmonary embolism: Secondary | ICD-10-CM | POA: Diagnosis not present

## 2021-01-04 DIAGNOSIS — Z841 Family history of disorders of kidney and ureter: Secondary | ICD-10-CM | POA: Diagnosis not present

## 2021-01-04 DIAGNOSIS — Z888 Allergy status to other drugs, medicaments and biological substances status: Secondary | ICD-10-CM | POA: Diagnosis not present

## 2021-01-04 LAB — COMPREHENSIVE METABOLIC PANEL
ALT: 16 U/L (ref 0–44)
AST: 20 U/L (ref 15–41)
Albumin: 5 g/dL (ref 3.5–5.0)
Alkaline Phosphatase: 64 U/L (ref 38–126)
Anion gap: 8 (ref 5–15)
BUN: 12 mg/dL (ref 6–20)
CO2: 22 mmol/L (ref 22–32)
Calcium: 9.2 mg/dL (ref 8.9–10.3)
Chloride: 109 mmol/L (ref 98–111)
Creatinine, Ser: 0.55 mg/dL (ref 0.44–1.00)
GFR, Estimated: >60 mL/min (ref >60)
Glucose, Bld: 100 mg/dL — ABNORMAL HIGH (ref 70–99)
Potassium: 3.2 mmol/L — ABNORMAL LOW (ref 3.5–5.1)
Sodium: 139 mmol/L (ref 135–145)
Total Bilirubin: 4 mg/dL — ABNORMAL HIGH (ref 0.3–1.2)
Total Protein: 8.9 g/dL — ABNORMAL HIGH (ref 6.5–8.1)

## 2021-01-04 LAB — RETICULOCYTES
Immature Retic Fract: 18.7 % — ABNORMAL HIGH (ref 2.3–15.9)
RBC.: 2.93 MIL/uL — ABNORMAL LOW (ref 3.87–5.11)
Retic Count, Absolute: 336 10*3/uL — ABNORMAL HIGH (ref 19.0–186.0)
Retic Ct Pct: 11.2 % — ABNORMAL HIGH (ref 0.4–3.1)

## 2021-01-04 LAB — RESP PANEL BY RT-PCR (FLU A&B, COVID) ARPGX2
Influenza A by PCR: NEGATIVE
Influenza B by PCR: NEGATIVE
SARS Coronavirus 2 by RT PCR: NEGATIVE

## 2021-01-04 LAB — CBC WITH DIFFERENTIAL/PLATELET
Abs Immature Granulocytes: 0.14 10*3/uL — ABNORMAL HIGH (ref 0.00–0.07)
Basophils Absolute: 0.1 10*3/uL (ref 0.0–0.1)
Basophils Relative: 0 %
Eosinophils Absolute: 0.2 10*3/uL (ref 0.0–0.5)
Eosinophils Relative: 1 %
HCT: 25.3 % — ABNORMAL LOW (ref 36.0–46.0)
Hemoglobin: 8.5 g/dL — ABNORMAL LOW (ref 12.0–15.0)
Immature Granulocytes: 1 %
Lymphocytes Relative: 17 %
Lymphs Abs: 3.2 10*3/uL (ref 0.7–4.0)
MCH: 28.6 pg (ref 26.0–34.0)
MCHC: 33.6 g/dL (ref 30.0–36.0)
MCV: 85.2 fL (ref 80.0–100.0)
Monocytes Absolute: 2.6 10*3/uL — ABNORMAL HIGH (ref 0.1–1.0)
Monocytes Relative: 14 %
Neutro Abs: 12.3 10*3/uL — ABNORMAL HIGH (ref 1.7–7.7)
Neutrophils Relative %: 67 %
Platelets: 625 10*3/uL — ABNORMAL HIGH (ref 150–400)
RBC: 2.97 MIL/uL — ABNORMAL LOW (ref 3.87–5.11)
RDW: 19.9 % — ABNORMAL HIGH (ref 11.5–15.5)
WBC: 18.4 10*3/uL — ABNORMAL HIGH (ref 4.0–10.5)
nRBC: 0.5 % — ABNORMAL HIGH (ref 0.0–0.2)

## 2021-01-04 LAB — I-STAT BETA HCG BLOOD, ED (MC, WL, AP ONLY): I-stat hCG, quantitative: <5 m[IU]/mL (ref <5)

## 2021-01-04 LAB — MAGNESIUM: Magnesium: 2.3 mg/dL (ref 1.7–2.4)

## 2021-01-04 MED ORDER — HYDROMORPHONE HCL 1 MG/ML IJ SOLN
1.0000 mg | Freq: Once | INTRAMUSCULAR | Status: AC
Start: 2021-01-04 — End: 2021-01-04
  Administered 2021-01-04: 1 mg via INTRAVENOUS
  Filled 2021-01-04: qty 1

## 2021-01-04 MED ORDER — SODIUM CHLORIDE 0.9 % IV SOLN
25.0000 mg | INTRAVENOUS | Status: DC | PRN
  Administered 2021-01-04 – 2021-01-07 (×9): 25 mg via INTRAVENOUS
  Filled 2021-01-04 (×9): qty 25

## 2021-01-04 MED ORDER — HYDROMORPHONE HCL 2 MG/ML IJ SOLN
2.0000 mg | Freq: Once | INTRAMUSCULAR | Status: AC
  Administered 2021-01-04: 2 mg via INTRAVENOUS
  Filled 2021-01-04: qty 1

## 2021-01-04 MED ORDER — KETOROLAC TROMETHAMINE 30 MG/ML IJ SOLN
30.0000 mg | Freq: Four times a day (QID) | INTRAMUSCULAR | Status: DC
  Administered 2021-01-04 (×2): 30 mg via INTRAVENOUS
  Filled 2021-01-04 (×2): qty 1

## 2021-01-04 MED ORDER — DIPHENHYDRAMINE HCL 25 MG PO CAPS
25.0000 mg | ORAL_CAPSULE | ORAL | Status: DC | PRN
  Administered 2021-01-04: 50 mg via ORAL
  Filled 2021-01-04: qty 2

## 2021-01-04 MED ORDER — HYDROMORPHONE 1 MG/ML IV SOLN
INTRAVENOUS | Status: DC
  Administered 2021-01-04: 3.5 mL via INTRAVENOUS
  Administered 2021-01-04: 30 mg via INTRAVENOUS
  Administered 2021-01-05: 2 mg via INTRAVENOUS
  Administered 2021-01-05: 3.5 mg via INTRAVENOUS
  Administered 2021-01-05: 4.5 mg via INTRAVENOUS
  Administered 2021-01-05: 2 mg via INTRAVENOUS
  Administered 2021-01-05: 0.5 mg via INTRAVENOUS
  Administered 2021-01-05: 3 mg via INTRAVENOUS
  Administered 2021-01-05: 30 mg via INTRAVENOUS
  Administered 2021-01-05: 4.5 mg via INTRAVENOUS
  Administered 2021-01-06: 5 mg via INTRAVENOUS
  Administered 2021-01-06: 3 mg via INTRAVENOUS
  Administered 2021-01-06: 4.5 mg via INTRAVENOUS
  Administered 2021-01-06: 30.5 mg via INTRAVENOUS
  Administered 2021-01-06: 1 mg via INTRAVENOUS
  Administered 2021-01-07: 2 mg via INTRAVENOUS
  Administered 2021-01-07: 3.5 mg via INTRAVENOUS
  Administered 2021-01-07: 2.5 mg via INTRAVENOUS
  Administered 2021-01-07: 3 mg via INTRAVENOUS
  Administered 2021-01-07: 2.5 mg via INTRAVENOUS
  Filled 2021-01-04 (×2): qty 30

## 2021-01-04 MED ORDER — SENNOSIDES-DOCUSATE SODIUM 8.6-50 MG PO TABS
1.0000 | ORAL_TABLET | Freq: Two times a day (BID) | ORAL | Status: DC
  Administered 2021-01-04 – 2021-01-07 (×7): 1 via ORAL
  Filled 2021-01-04 (×7): qty 1

## 2021-01-04 MED ORDER — ONDANSETRON 8 MG PO TBDP
8.0000 mg | ORAL_TABLET | Freq: Three times a day (TID) | ORAL | Status: DC | PRN
Start: 2021-01-04 — End: 2021-01-04

## 2021-01-04 MED ORDER — LACTATED RINGERS IV SOLN: INTRAVENOUS | Status: DC

## 2021-01-04 MED ORDER — FAMOTIDINE IN NACL 20-0.9 MG/50ML-% IV SOLN: 20.0000 mg | Freq: Two times a day (BID) | INTRAVENOUS | Status: DC

## 2021-01-04 MED ORDER — SODIUM CHLORIDE 0.45 % IV SOLN: INTRAVENOUS | Status: DC

## 2021-01-04 MED ORDER — HYDROMORPHONE HCL 2 MG/ML IJ SOLN
2.0000 mg | INTRAMUSCULAR | Status: AC
  Administered 2021-01-04: 2 mg via INTRAVENOUS
  Filled 2021-01-04: qty 1

## 2021-01-04 MED ORDER — HYDROMORPHONE HCL 1 MG/ML IJ SOLN
1.0000 mg | INTRAMUSCULAR | Status: DC | PRN
  Administered 2021-01-04: 1 mg via INTRAVENOUS
  Filled 2021-01-04: qty 1

## 2021-01-04 MED ORDER — RIVAROXABAN 20 MG PO TABS
20.0000 mg | ORAL_TABLET | Freq: Every day | ORAL | Status: DC
  Administered 2021-01-04 – 2021-01-07 (×4): 20 mg via ORAL
  Filled 2021-01-04 (×5): qty 1

## 2021-01-04 MED ORDER — PROMETHAZINE HCL 25 MG PO TABS
25.0000 mg | ORAL_TABLET | Freq: Once | ORAL | Status: AC
  Administered 2021-01-04: 25 mg via ORAL
  Filled 2021-01-04: qty 1

## 2021-01-04 MED ORDER — CHLORHEXIDINE GLUCONATE CLOTH 2 % EX PADS
6.0000 | MEDICATED_PAD | Freq: Every day | CUTANEOUS | Status: DC
  Administered 2021-01-04 – 2021-01-07 (×4): 6 via TOPICAL

## 2021-01-04 MED ORDER — KETOROLAC TROMETHAMINE 30 MG/ML IJ SOLN
30.0000 mg | Freq: Once | INTRAMUSCULAR | Status: AC
  Administered 2021-01-04: 30 mg via INTRAVENOUS
  Filled 2021-01-04: qty 1

## 2021-01-04 MED ORDER — POTASSIUM CHLORIDE 10 MEQ/100ML IV SOLN
10.0000 meq | INTRAVENOUS | Status: AC
  Administered 2021-01-04 (×6): 10 meq via INTRAVENOUS
  Filled 2021-01-04 (×6): qty 100

## 2021-01-04 MED ORDER — POLYETHYLENE GLYCOL 3350 17 G PO PACK: 17.0000 g | PACK | Freq: Every day | ORAL | Status: DC | PRN

## 2021-01-04 MED ORDER — HYDROXYZINE HCL 25 MG PO TABS
25.0000 mg | ORAL_TABLET | Freq: Once | ORAL | Status: DC
  Filled 2021-01-04: qty 1

## 2021-01-04 MED ORDER — DIPHENHYDRAMINE HCL 25 MG PO CAPS
25.0000 mg | ORAL_CAPSULE | ORAL | Status: DC | PRN
  Administered 2021-01-07: 25 mg via ORAL
  Filled 2021-01-04: qty 1

## 2021-01-04 MED ORDER — SODIUM CHLORIDE 0.9% FLUSH: 9.0000 mL | INTRAVENOUS | Status: DC | PRN

## 2021-01-04 MED ORDER — KETOROLAC TROMETHAMINE 30 MG/ML IJ SOLN
15.0000 mg | Freq: Four times a day (QID) | INTRAMUSCULAR | Status: DC
  Administered 2021-01-04 – 2021-01-07 (×12): 15 mg via INTRAVENOUS
  Filled 2021-01-04 (×11): qty 1

## 2021-01-04 MED ORDER — SODIUM CHLORIDE 0.9 % IV SOLN
12.5000 mg | Freq: Four times a day (QID) | INTRAVENOUS | Status: DC | PRN
  Administered 2021-01-04 – 2021-01-07 (×8): 12.5 mg via INTRAVENOUS
  Filled 2021-01-04 (×3): qty 12.5
  Filled 2021-01-04: qty 0.5
  Filled 2021-01-04 (×5): qty 12.5

## 2021-01-04 MED ORDER — FAMOTIDINE 20 MG PO TABS
20.0000 mg | ORAL_TABLET | Freq: Two times a day (BID) | ORAL | Status: DC
  Administered 2021-01-04 – 2021-01-07 (×7): 20 mg via ORAL
  Filled 2021-01-04 (×7): qty 1

## 2021-01-04 MED ORDER — NALOXONE HCL 0.4 MG/ML IJ SOLN
0.4000 mg | INTRAMUSCULAR | Status: DC | PRN
Start: 2021-01-04 — End: 2021-01-08

## 2021-01-04 NOTE — Progress Notes (Signed)
Maryama Kuriakose is a 40 year old female with a medical history significant for sickle cell disease, chronic pain syndrome, opiate dependence and tolerance, history of mild intermittent asthma, frequent hospitalizations, and history of PE on Xarelto was admitted overnight and sickle cell crisis. Patient continues to complain of significant pain primarily to central chest, low back, and lower extremities.  She rates her pain at 9/10.  Care plan: Decrease Toradol to 15 mg every 6 hours Initiate IV Dilaudid PCA with settings of 0.5 mg, 10-minute lockout, and 3 mg/h Discontinue IV Benadryl Will reassess in a.m.   [name]  APRN, MSN, FNP-C [facility] [address] [place], [place] [address] [phone]

## 2021-01-04 NOTE — ED Provider Notes (Signed)
[facility] DEPT Provider Note   CSN: [id number] Arrival date & time: 01/03/21  1942     History Chief Complaint  Patient presents with   Chest Pain    Sickle cell     Molly Gutierrez is a 40 y.o. female with a history of sickle cell anemia, PE, asthma, chronic opioid use who presents to the emergency department with a chief complaint of sickle cell pain crisis.  The patient endorses constant, dull, throbbing, achy, non-radiating in the center of her chest pain since yesterday coming by aching and throbbing in her arms, legs, and low back.  She states that these symptoms feel similar to previous sickle cell pain crisis.  Pain is not pleuritic or postprandial.  She is prescribed hydromorphone every 4 hours at home, but states that she has not taken the medication more than 24 hours due to her children being in her care.  She denies fever, chills, shortness of breath, palpitations, back pain, flank pain, abdominal pain, nausea, vomiting, diarrhea, dizziness, lightheadedness, rash, headache, syncope.  No known aggravating or alleviating factors.  No treatment prior to arrival.  The history is provided by the patient and medical records. No language interpreter was used.      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   Sickle cell anemia with pain ([place]) 04/24/2020   Mild intermittent asthma 03/31/2020   Sickle cell anemia with crisis ([place]) 01/07/2020   Sickle cell crisis acute chest syndrome ([place]) 07/29/2019   Drug-seeking behavior 07/07/2019   Sinus tachycardia 07/07/2019   Therapeutic opioid-induced constipation (OIC) 07/07/2019   Breast mass in female 06/29/2019   Atelectasis of right  lung 06/29/2019   Sickle cell crisis ([place]) 06/27/2019   Sickle cell pain crisis ([place]) 04/19/2019   Pneumonia 03/27/2019   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   Anterior chest wall pain 11/13/2018   Epigastric abdominal pain 11/13/2018   Hematuria 11/13/2018   Hyperbilirubinemia 11/12/2018   Long term current use of anticoagulant therapy 11/12/2018   History of pulmonary embolism 09/26/2018   Hx of pulmonary embolism ([place]) 09/26/2018   Opioid dependence ([place]) 09/13/2018   Port-A-Cath in place 09/13/2018   Narcotic abuse, continuous ([place]) 08/28/2018   Anemia 07/03/2018   Personal history of other venous thrombosis and embolism 07/03/2018   History of transfusion 07/03/2018   Gastro-esophageal reflux disease without esophagitis 06/02/2018   Asthma 05/19/2018   Anxiety and depression 10/30/2017   At risk for sepsis 06/13/2017   Mitral regurgitation 02/01/2014   Itching 12/13/2013   Nausea & vomiting 12/13/2013   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   Frequent complaints of pain 11/01/2013   Hypokalemia 09/19/2013   S/P total hip arthroplasty 05/19/2013   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, pelvic region and thigh 05/12/2013   Lower urinary tract infectious disease 03/02/2013   Chest pain 11/14/2012   Sickle-cell anemia ([place]) 10/30/2012   Pain management 08/01/2012   Reticulocytosis 07/31/2012   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     Family History  Problem Relation Age of Onset   Renal Disease  Mother    Hypertension Mother    High Cholesterol Mother     Social History   Tobacco Use   Smoking status: Never   Smokeless tobacco: Never  Vaping Use   Vaping Use: Never used  Substance Use Topics   Alcohol use: Never   Drug use: Never    Home Medications Prior to Admission medications   Medication Sig Start Date End Date Taking? Authorizing Provider  albuterol (VENTOLIN HFA) 108 (90 Base) MCG/ACT inhaler Inhale 2 puffs into the lungs 2 (two) times daily as needed for wheezing or shortness of breath. 04/13/20 04/13/21  Vevelyn Francois, NP  Cholecalciferol (VITAMIN D3) 25 MCG (1000 UT) CAPS Take 1 capsule (1,000 Units total) by mouth daily. 04/13/20 04/13/21  Vevelyn Francois, NP  Deferasirox (JADENU) 360 MG TABS Take 3 tablets (1,080 mg total) by mouth daily before breakfast. 04/13/20 04/13/21  Vevelyn Francois, NP  diphenhydrAMINE (BENADRYL) 25 mg capsule Take 25 mg by mouth 3 (three) times daily as needed for itching.    [provider]  folic acid (FOLVITE) 1 MG tablet Take 1 tablet (1 mg total) by mouth daily. 04/13/20 04/13/21  Vevelyn Francois, NP  gabapentin (NEURONTIN) 300 MG capsule Take 1 capsule (300 mg total) by mouth 3 (three) times daily. 04/13/20 04/13/21  Vevelyn Francois, NP  HYDROmorphone (DILAUDID) 4 MG tablet Take 1 tablet (4 mg total) by mouth every 6 (six) hours as needed for up to 15 days for moderate pain or severe pain. 12/26/20 01/10/21  Vevelyn Francois, NP  mirtazapine (REMERON) 45 MG tablet Take 1 tablet (45 mg total) by mouth at bedtime. 04/13/20 04/13/21  Vevelyn Francois, NP  mometasone-formoterol (DULERA) 100-5 MCG/ACT AERO Inhale 2 puffs into the lungs daily as needed for wheezing or shortness of breath. 04/13/20 04/13/21  Vevelyn Francois, NP  omeprazole (PRILOSEC) 20 MG capsule Take 1 capsule (20 mg total) by mouth 2 (two) times daily before a meal. Patient taking differently: Take 20 mg by mouth daily. 08/02/20 08/02/21  Vevelyn Francois, NP   OXBRYTA 500 MG TABS tablet Take 1,500 mg by mouth daily. Patient not taking: Reported on 12/08/2020 10/30/20   [provider]  polyethylene glycol (MIRALAX / GLYCOLAX) 17 g packet Take 17 g by mouth daily as needed for mild constipation ([place]).    [provider]  promethazine (PHENERGAN) 25 MG tablet Take 0.5-1 tablets (12.5-25 mg total) by mouth every 6 (six) hours as needed for nausea or vomiting. 10/25/20 10/25/21  Vevelyn Francois, NP  vitamin B-12 (CYANOCOBALAMIN) 1000 MCG tablet Take 1 tablet (1,000 mcg total) by mouth daily. 04/13/20 04/13/21  Vevelyn Francois, NP  XARELTO 20 MG TABS tablet Take 1 tablet (20 mg total) by mouth daily with supper. 10/25/20 10/25/21  Vevelyn Francois, NP    Allergies    Cefaclor, Hydroxyurea, Omeprazole, and Ketamine  Review of Systems   Review of Systems  Constitutional:  Negative for activity change, chills, fatigue and fever.  HENT:  Negative for congestion  and sore throat.   Respiratory:  Negative for cough and shortness of breath.   Cardiovascular:  Negative for chest pain and palpitations.  Gastrointestinal:  Negative for abdominal pain, diarrhea, nausea and vomiting.  Genitourinary:  Negative for dysuria.  Musculoskeletal:  Positive for arthralgias and myalgias. Negative for back pain, joint swelling and neck pain.  Skin:  Negative for rash.  Allergic/Immunologic: Negative for immunocompromised state.  Neurological:  Negative for seizures, syncope, weakness and headaches.  Psychiatric/Behavioral:  Negative for confusion.    Physical Exam Updated Vital Signs BP 92/71   Pulse 94   Temp 98.6 F (37 C) (Oral)   Resp 18   Ht 5\' 3"  (1.6 m)   Wt 56.7 kg   LMP 12/09/2020 (Within Days)   SpO2 95%   BMI 22.14 kg/m   Physical Exam Vitals and nursing note reviewed.  Constitutional:      General: She is not in acute distress.    Appearance: She is not ill-appearing.  HENT:     Head: Normocephalic.     Mouth/Throat:      Mouth: Mucous membranes are moist.     Pharynx: No oropharyngeal exudate or posterior oropharyngeal erythema.  Eyes:     General: No scleral icterus.    Conjunctiva/sclera: Conjunctivae normal.  Cardiovascular:     Rate and Rhythm: Normal rate and regular rhythm.     Heart sounds: No murmur heard.   No friction rub. No gallop.  Pulmonary:     Effort: Pulmonary effort is normal. No respiratory distress.     Breath sounds: No stridor. No wheezing, rhonchi or rales.     Comments: Reproducible tenderness to palpation to the sternum.  No crepitus or step-offs.  No overlying rashes, erythema, or warmth. Chest:     Chest wall: Tenderness present.  Abdominal:     General: There is no distension.     Palpations: Abdomen is soft. There is no mass.     Tenderness: There is no abdominal tenderness. There is no right CVA tenderness, left CVA tenderness, guarding or rebound.     Hernia: No hernia is present.  Musculoskeletal:        General: No tenderness.     Cervical back: Normal range of motion and neck supple.     Right lower leg: No edema.     Left lower leg: No edema.  Skin:    General: Skin is warm.     Findings: No rash.  Neurological:     Mental Status: She is alert.  Psychiatric:        Behavior: Behavior normal.    ED Results / Procedures / Treatments   Labs (all labs ordered are listed, but only abnormal results are displayed) Labs Reviewed  RETICULOCYTES - Abnormal; Notable for the following components:      Result Value   Retic Ct Pct 11.2 (*)    RBC. 2.93 (*)    Retic Count, Absolute 336.0 (*)    Immature Retic Fract 18.7 (*)    All other components within normal limits  COMPREHENSIVE METABOLIC PANEL - Abnormal; Notable for the following components:   Potassium 3.2 (*)    Glucose, Bld 100 (*)    Total Protein 8.9 (*)    Total Bilirubin 4.0 (*)    All other components within normal limits  CBC WITH DIFFERENTIAL/PLATELET - Abnormal; Notable for the following  components:   WBC 18.4 (*)    RBC 2.97 (*)    Hemoglobin 8.5 (*)  HCT 25.3 (*)    RDW 19.9 (*)    Platelets 625 (*)    nRBC 0.5 (*)    Neutro Abs 12.3 (*)    Monocytes Absolute 2.6 (*)    Abs Immature Granulocytes 0.14 (*)    All other components within normal limits  RESP PANEL BY RT-PCR (FLU A&B, COVID) [id number]  I-STAT BETA HCG BLOOD, ED ([facility], [facility], [facility] ONLY)    EKG EKG Interpretation  Date/Time:  Thursday January 04 2021 03:46:03 EDT Ventricular Rate:  85 PR Interval:  149 QRS Duration: 86 QT Interval:  410 QTC Calculation: 488 R Axis:   30 Text Interpretation: Sinus rhythm Normal ECG Confirmed by Veryl Speak [phone]) on 01/04/2021 4:50:31 AM  Radiology DG Chest 2 View  Result Date: 01/03/2021 CLINICAL DATA:  Chest pain for 2 days. Nausea. Sickle cell disease. EXAM: CHEST - 2 VIEW COMPARISON:  12/08/2020 FINDINGS: The heart size and mediastinal contours are within normal limits. Bilateral Port-A-Caths remain in appropriate position. Both lungs are clear. The visualized skeletal structures are unremarkable. IMPRESSION: No active cardiopulmonary disease. Electronically Signed   By: Marlaine Hind M.D.   On: 01/03/2021 22:03    Procedures Procedures   Medications Ordered in ED Medications  diphenhydrAMINE (BENADRYL) capsule 25-50 mg (50 mg Oral Given 01/04/21 0114)  ondansetron (ZOFRAN-ODT) disintegrating tablet 8 mg (has no administration in time range)  promethazine (PHENERGAN) 12.5 mg in sodium chloride 0.9 % 50 mL IVPB (has no administration in time range)  lactated ringers infusion (has no administration in time range)  HYDROmorphone (DILAUDID) injection 2 mg (2 mg Intravenous Given 01/04/21 0115)  HYDROmorphone (DILAUDID) injection 2 mg (2 mg Intravenous Given 01/04/21 0151)  ketorolac (TORADOL) 30 MG/ML injection 30 mg (30 mg Intravenous Given 01/04/21 0337)  promethazine (PHENERGAN) tablet 25 mg (25 mg Oral Given 01/04/21 0412)  HYDROmorphone (DILAUDID) injection 2 mg (2 mg  Intravenous Given 01/04/21 0450)    ED Course  I have reviewed the triage vital signs and the nursing notes.  Pertinent labs & imaging results that were available during my care of the patient were reviewed by me and considered in my medical decision making (see chart for details).    MDM Rules/Calculators/A&P                           history of sickle cell anemia, PE, asthma, chronic opioid use who presents to the emergency department with a 1 day history of central, nonradiating chest pain, and pain in her arms, legs, and lower back.  Vital signs are reassuring.  On exam, she is nontoxic-appearing.  She has reproducible tenderness palpation to the sternum.  Labs of been reviewed and independently interpreted by me.  Leukocytosis of 18.4, but this appears chronic.  Hemoglobin is stable at 8.5.  Reticulocyte count is elevated.  COVID-19 test is negative.  No significant metabolic derangements.  Chest x-ray is unremarkable.  Low suspicion for PE since pain is reproducible on exam.  Doubt acute chest or ACS.  After multiple doses of pain medication, patient reports improvement in her symptoms.  Given uncontrolled sickle cell pain crisis, consult to the hospitalist team for admission.  Dr. Alcario Drought will accept the patient for admission. The patient appears reasonably stabilized for admission considering the current resources, flow, and capabilities available in the ED at this time, and I doubt any other [facility] requiring further screening and/or treatment in the ED prior to admission.  Final Clinical  Impression(s) / ED Diagnoses Final diagnoses:  Sickle cell pain crisis [facility])    Rx / DC Orders ED Discharge Orders     None        Marinell Igarashi A, PA-C 01/04/21 0746    Veryl Speak, MD 01/08/21 2310

## 2021-01-04 NOTE — H&P (Signed)
History and Physical    [facility] MVE:[id number] DOB: [birth year] DOA: 01/03/2021  PCP: Vevelyn Francois, NP  Patient coming from: Home  Chief Complaint: CP, N/V  HPI: Molly Gutierrez is a 40 y.o. female with medical history significant of sickle cell anemia, PE on xarelto. Presenting with chest pain, N/V. Her symptoms started 2 days ago. It started with central throbbing/achy chest pain that was non-radiating and constant. It was typical of her normal pain crisis. She tried her home meds, but they did not help. She is not aware of anything else making it worse. She started having N/V around the same time. There is no one in her house with similar symptoms. No recent diet changes. She had no fever or diarrhea. Her pain became intolerable, so she came to the ED for help. She denies any other aggravating or alleviating symptoms.      ED Course: She was found to be in crisis. CXR was clear. She was started on fluids, pain meds. TRH was called for admission.   Review of Systems:  Review of systems is otherwise negative for all not mentioned in HPI.   PMHx Past Medical History:  Diagnosis Date   Asthma    Eczema    History of pulmonary embolus (PE)    Sickle cell anemia (HCC)     PSHx Past Surgical History:  Procedure Laterality Date   CHOLECYSTECTOMY     ERCP     JOINT REPLACEMENT     PORTA CATH INSERTION     TUBAL LIGATION     WISDOM TOOTH EXTRACTION      SocHx  reports that she has never smoked. She has never used smokeless tobacco. She reports that she does not drink alcohol and does not use drugs.  Allergies  Allergen Reactions   Cefaclor Hives and Swelling   Hydroxyurea Palpitations and Other (See Comments)    Lowers "blood levels" and heart rate (causes HYPOtension); "it messes me up, it drops my levels and stuff"    Omeprazole Anaphylaxis and Other (See Comments)    Causes "sharp pains in the stomach"   Ketamine Palpitations and Other (See Comments)     "Pt states she has had previous reaction to ketamine. States she becomes flushed, heart races, dizzy, and feels like she is going to pass out."    FamHx Family History  Problem Relation Age of Onset   Renal Disease Mother    Hypertension Mother    High Cholesterol Mother     Prior to Admission medications   Medication Sig Start Date End Date Taking? Authorizing Provider  albuterol (VENTOLIN HFA) 108 (90 Base) MCG/ACT inhaler Inhale 2 puffs into the lungs 2 (two) times daily as needed for wheezing or shortness of breath. 04/13/20 04/13/21  Vevelyn Francois, NP  Cholecalciferol (VITAMIN D3) 25 MCG (1000 UT) CAPS Take 1 capsule (1,000 Units total) by mouth daily. 04/13/20 04/13/21  Vevelyn Francois, NP  Deferasirox (JADENU) 360 MG TABS Take 3 tablets (1,080 mg total) by mouth daily before breakfast. 04/13/20 04/13/21  Vevelyn Francois, NP  diphenhydrAMINE (BENADRYL) 25 mg capsule Take 25 mg by mouth 3 (three) times daily as needed for itching.    [provider]  folic acid (FOLVITE) 1 MG tablet Take 1 tablet (1 mg total) by mouth daily. 04/13/20 04/13/21  Vevelyn Francois, NP  gabapentin (NEURONTIN) 300 MG capsule Take 1 capsule (300 mg total) by mouth 3 (three) times daily. 04/13/20 04/13/21  Dionisio David  M, NP  HYDROmorphone (DILAUDID) 4 MG tablet Take 1 tablet (4 mg total) by mouth every 6 (six) hours as needed for up to 15 days for moderate pain or severe pain. 12/26/20 01/10/21  Vevelyn Francois, NP  mirtazapine (REMERON) 45 MG tablet Take 1 tablet (45 mg total) by mouth at bedtime. 04/13/20 04/13/21  Vevelyn Francois, NP  mometasone-formoterol (DULERA) 100-5 MCG/ACT AERO Inhale 2 puffs into the lungs daily as needed for wheezing or shortness of breath. 04/13/20 04/13/21  Vevelyn Francois, NP  omeprazole (PRILOSEC) 20 MG capsule Take 1 capsule (20 mg total) by mouth 2 (two) times daily before a meal. Patient taking differently: Take 20 mg by mouth daily. 08/02/20 08/02/21  Vevelyn Francois,  NP  OXBRYTA 500 MG TABS tablet Take 1,500 mg by mouth daily. Patient not taking: Reported on 12/08/2020 10/30/20   [provider]  polyethylene glycol (MIRALAX / GLYCOLAX) 17 g packet Take 17 g by mouth daily as needed for mild constipation ([place]).    [provider]  promethazine (PHENERGAN) 25 MG tablet Take 0.5-1 tablets (12.5-25 mg total) by mouth every 6 (six) hours as needed for nausea or vomiting. 10/25/20 10/25/21  Vevelyn Francois, NP  vitamin B-12 (CYANOCOBALAMIN) 1000 MCG tablet Take 1 tablet (1,000 mcg total) by mouth daily. 04/13/20 04/13/21  Vevelyn Francois, NP  XARELTO 20 MG TABS tablet Take 1 tablet (20 mg total) by mouth daily with supper. 10/25/20 10/25/21  Vevelyn Francois, NP    Physical Exam: Vitals:   01/04/21 0430 01/04/21 0545 01/04/21 0645 01/04/21 0709  BP: 115/83 95/71 102/74 92/71  Pulse: 85 96 93 94  Resp: 16 12 14 18   Temp:    98.6 F (37 C)  TempSrc:    Oral  SpO2: 98% 98% 98% 95%  Weight:      Height:        General: 40 y.o. female resting in bed in NAD Eyes: PERRL, normal sclera ENMT: Nares patent w/o discharge, orophaynx clear, dentition normal, ears w/o discharge/lesions/ulcers Neck: Supple, trachea midline Cardiovascular: RRR, +S1, S2, no m/g/r, equal pulses throughout Respiratory: CTABL, no w/r/r, normal WOB GI: BS+, NDNT, no masses noted, no organomegaly noted MSK: No e/c/c; appear hypovolemic Neuro: A&O x 3, no focal deficits Psyc: Appropriate interaction and affect, calm/cooperative  Labs on Admission: I have personally reviewed following labs and imaging studies  CBC: Recent Labs  Lab 01/04/21 0030  WBC 18.4*  NEUTROABS 12.3*  HGB 8.5*  HCT 25.3*  MCV 85.2  PLT 675*   Basic Metabolic Panel: Recent Labs  Lab 01/04/21 0030  NA 139  K 3.2*  CL 109  CO2 22  GLUCOSE 100*  BUN 12  CREATININE 0.55  CALCIUM 9.2   GFR: Estimated Creatinine Clearance: 78.1 mL/min (by C-G formula based on SCr of 0.55  mg/dL). Liver Function Tests: Recent Labs  Lab 01/04/21 0030  AST 20  ALT 16  ALKPHOS 64  BILITOT 4.0*  PROT 8.9*  ALBUMIN 5.0   No results for input(s): LIPASE, AMYLASE in the last 168 hours. No results for input(s): AMMONIA in the last 168 hours. Coagulation Profile: No results for input(s): INR, PROTIME in the last 168 hours. Cardiac Enzymes: No results for input(s): CKTOTAL, CKMB, CKMBINDEX, TROPONINI in the last 168 hours. BNP (last 3 results) No results for input(s): PROBNP in the last 8760 hours. HbA1C: No results for input(s): HGBA1C in the last 72 hours. CBG: No results for  input(s): GLUCAP in the last 168 hours. Lipid Profile: No results for input(s): CHOL, HDL, LDLCALC, TRIG, CHOLHDL, LDLDIRECT in the last 72 hours. Thyroid Function Tests: No results for input(s): TSH, T4TOTAL, FREET4, T3FREE, THYROIDAB in the last 72 hours. Anemia Panel: Recent Labs    01/04/21 0030  RETICCTPCT 11.2*   Urine analysis:    Component Value Date/Time   COLORURINE YELLOW 11/22/2020 0430   APPEARANCEUR HAZY (A) 11/22/2020 0430   LABSPEC 1.010 11/22/2020 0430   PHURINE 6.0 11/22/2020 0430   GLUCOSEU NEGATIVE 11/22/2020 0430   HGBUR SMALL (A) 11/22/2020 0430   BILIRUBINUR NEGATIVE 11/22/2020 0430   BILIRUBINUR neg 08/03/2020 1429   KETONESUR NEGATIVE 11/22/2020 0430   PROTEINUR NEGATIVE 11/22/2020 0430   UROBILINOGEN 1.0 08/03/2020 1429   NITRITE NEGATIVE 11/22/2020 0430   LEUKOCYTESUR NEGATIVE 11/22/2020 0430    Radiological Exams on Admission: DG Chest 2 View  Result Date: 01/03/2021 CLINICAL DATA:  Chest pain for 2 days. Nausea. Sickle cell disease. EXAM: CHEST - 2 VIEW COMPARISON:  12/08/2020 FINDINGS: The heart size and mediastinal contours are within normal limits. Bilateral Port-A-Caths remain in appropriate position. Both lungs are clear. The visualized skeletal structures are unremarkable. IMPRESSION: No active cardiopulmonary disease. Electronically Signed   By:  Marlaine Hind M.D.   On: 01/03/2021 22:03    EKG: Independently reviewed. NSR, no st elevations  Assessment/Plan Sickle Cell Crisis Sickle Cell anemia Thrombocytosis Elevated bilirubin Nausea/vomiting Chronic opioid use/dependence     - admit to inpt, tele; sickle cell team     - LR running     - pain control, anti-emetics, benadryl     - looks like her Hgb is at baseline, but she is dry; consider repeating a CBC this afternoon  Hx of PE on xarelto     - resume home meds  Hypokalemia     - replace K+, check Mg2+  DVT prophylaxis: SCDs  Code Status: FULL  Family Communication: None at bedside  Consults called: None   Status is: Inpatient  Remains inpatient appropriate because:Inpatient level of care appropriate due to severity of illness  Dispo: The patient is from: Home              Anticipated d/c is to: Home              Patient currently is not medically stable to d/c.   Difficult to place patient No  Time spent coordinating admission: 45 minutes  [facility]  If 7PM-7AM, please contact night-coverage [email or web address]  01/04/2021, 7:21 AM

## 2021-01-05 ENCOUNTER — Other Ambulatory Visit: Payer: Self-pay | Admitting: Family Medicine

## 2021-01-05 DIAGNOSIS — D5700 Hb-SS disease with crisis, unspecified: Principal | ICD-10-CM

## 2021-01-05 LAB — BASIC METABOLIC PANEL
Anion gap: 6 (ref 5–15)
BUN: 15 mg/dL (ref 6–20)
CO2: 24 mmol/L (ref 22–32)
Calcium: 8.4 mg/dL — ABNORMAL LOW (ref 8.9–10.3)
Chloride: 108 mmol/L (ref 98–111)
Creatinine, Ser: 0.62 mg/dL (ref 0.44–1.00)
GFR, Estimated: >60 mL/min (ref >60)
Glucose, Bld: 105 mg/dL — ABNORMAL HIGH (ref 70–99)
Potassium: 4.1 mmol/L (ref 3.5–5.1)
Sodium: 138 mmol/L (ref 135–145)

## 2021-01-05 LAB — CBC
HCT: 19.2 % — ABNORMAL LOW (ref 36.0–46.0)
Hemoglobin: 6.7 g/dL — CL (ref 12.0–15.0)
MCH: 29 pg (ref 26.0–34.0)
MCHC: 34.9 g/dL (ref 30.0–36.0)
MCV: 83.1 fL (ref 80.0–100.0)
Platelets: 545 10*3/uL — ABNORMAL HIGH (ref 150–400)
RBC: 2.31 MIL/uL — ABNORMAL LOW (ref 3.87–5.11)
RDW: 20.1 % — ABNORMAL HIGH (ref 11.5–15.5)
WBC: 15.8 10*3/uL — ABNORMAL HIGH (ref 4.0–10.5)
nRBC: 0.2 % (ref 0.0–0.2)

## 2021-01-05 NOTE — Progress Notes (Signed)
Date and time results received: 01/05/21 1140 (use smartphrase ".now" to insert current time)  Test: hgb Critical Value: 6.7tt  Name of Provider Notified: [place] Hollis  Orders Received? Or Actions Taken?:  continue to monitor

## 2021-01-05 NOTE — Progress Notes (Addendum)
Subjective: Molly Gutierrez is a 40 year old female with a medical history significant for sickle cell disease, chronic pain syndrome opiate dependence and tolerance, history of mild intermittent asthma, recent hospitalizations, and history of PE on Xarelto was admitted for sickle cell pain crisis.  Patient states that pain has not improved very much overnight.  She rates her pain as 8/10.  Pain is primarily to central chest and low back.  She denies any shortness of breath, dizziness, headache, urinary symptoms, nausea, vomiting, or diarrhea.  Objective:  Vital signs in last 24 hours:  Vitals:   01/05/21 0004 01/05/21 0201 01/05/21 0500 01/05/21 0514  BP:  99/71  (!) 115/93  Pulse:  97  92  Resp: '11 16 13 16  '$ Temp:  99.4 F (37.4 C)  98.9 F (37.2 C)  TempSrc:  Oral  Oral  SpO2: 96% 95% 95% 97%  Weight:      Height:        Intake/Output from previous day:   Intake/Output Summary (Last 24 hours) at 01/05/2021 1202 Last data filed at 01/05/2021 0600 Gross per 24 hour  Intake 2336.31 ml  Output 400 ml  Net 1936.31 ml    Physical Exam: General: Alert, awake, oriented x3, in no acute distress.  HEENT: [place]/AT [place], EOMI Neck: Trachea midline,  no masses, no thyromegal,y no JVD, no carotid bruit OROPHARYNX:  Moist, No exudate/ erythema/lesions.  Heart: Regular rate and rhythm, without murmurs, rubs, gallops, PMI non-displaced, no heaves or thrills on palpation.  Lungs: Clear to auscultation, no wheezing or rhonchi noted. No increased vocal fremitus resonant to percussion  Abdomen: Soft, nontender, nondistended, positive bowel sounds, no masses no hepatosplenomegaly noted..  Neuro: No focal neurological deficits noted cranial nerves II through XII grossly intact. DTRs 2+ bilaterally upper and lower extremities. Strength 5 out of 5 in bilateral upper and lower extremities. Musculoskeletal: No warm swelling or erythema around joints, no spinal tenderness noted. Psychiatric:  Patient alert and oriented x3, good insight and cognition, good recent to remote recall. Lymph node survey: No cervical axillary or inguinal lymphadenopathy noted.  Lab Results:  Basic Metabolic Panel:    Component Value Date/Time   NA 138 01/05/2021 1053   NA 137 08/02/2020 1538   K 4.1 01/05/2021 1053   CL 108 01/05/2021 1053   CO2 24 01/05/2021 1053   BUN 15 01/05/2021 1053   BUN 7 08/02/2020 1538   CREATININE 0.62 01/05/2021 1053   GLUCOSE 105 (H) 01/05/2021 1053   CALCIUM 8.4 (L) 01/05/2021 1053   CBC:    Component Value Date/Time   WBC 15.8 (H) 01/05/2021 1053   HGB 6.7 (LL) 01/05/2021 1053   HGB 7.8 (L) 08/02/2020 1538   HCT 19.2 (L) 01/05/2021 1053   HCT 22.3 (L) 08/02/2020 1538   PLT 545 (H) 01/05/2021 1053   PLT 560 (H) 08/02/2020 1538   MCV 83.1 01/05/2021 1053   MCV 92 08/02/2020 1538   NEUTROABS 12.3 (H) 01/04/2021 0030   NEUTROABS 9.5 (H) 08/02/2020 1538   LYMPHSABS 3.2 01/04/2021 0030   LYMPHSABS 3.0 08/02/2020 1538   MONOABS 2.6 (H) 01/04/2021 0030   EOSABS 0.2 01/04/2021 0030   EOSABS 0.4 08/02/2020 1538   BASOSABS 0.1 01/04/2021 0030   BASOSABS 0.1 08/02/2020 1538    Recent Results (from the past 240 hour(s))  Resp Panel by RT-PCR (Flu A&B, Covid) Nasopharyngeal Swab     Status: None   Collection Time: 01/04/21  3:48 AM   Specimen: Nasopharyngeal Swab; Nasopharyngeal(NP) swabs  in vial transport medium  Result Value Ref Range Status   SARS Coronavirus 2 by RT PCR NEGATIVE NEGATIVE Final    Comment: (NOTE) SARS-CoV-2 target nucleic acids are NOT DETECTED.  The SARS-CoV-2 RNA is generally detectable in upper respiratory specimens during the acute phase of infection. The lowest concentration of SARS-CoV-2 viral copies this assay can detect is 138 copies/mL. A negative result does not preclude SARS-Cov-2 infection and should not be used as the sole basis for treatment or other patient management decisions. A negative result may occur with   improper specimen collection/handling, submission of specimen other than nasopharyngeal swab, presence of viral mutation(s) within the areas targeted by this assay, and inadequate number of viral copies(<138 copies/mL). A negative result must be combined with clinical observations, patient history, and epidemiological information. The expected result is Negative.  Fact Sheet for Patients:  EntrepreneurPulse.com.au  Fact Sheet for Healthcare Providers:  IncredibleEmployment.be  This test is no t yet approved or cleared by the [place] FDA and  has been authorized for detection and/or diagnosis of SARS-CoV-2 by FDA under an Emergency Use Authorization (EUA). This EUA will remain  in effect (meaning this test can be used) for the duration of the COVID-19 declaration under Section 564(b)(1) of the Act, 21 U.S.C.section 360bbb-3(b)(1), unless the authorization is terminated  or revoked sooner.       Influenza A by PCR NEGATIVE NEGATIVE Final   Influenza B by PCR NEGATIVE NEGATIVE Final    Comment: (NOTE) The Xpert Xpress SARS-CoV-2/FLU/RSV plus assay is intended as an aid in the diagnosis of influenza from Nasopharyngeal swab specimens and should not be used as a sole basis for treatment. Nasal washings and aspirates are unacceptable for Xpert Xpress SARS-CoV-2/FLU/RSV testing.  Fact Sheet for Patients: EntrepreneurPulse.com.au  Fact Sheet for Healthcare Providers: IncredibleEmployment.be  This test is not yet approved or cleared by the [place] FDA and has been authorized for detection and/or diagnosis of SARS-CoV-2 by FDA under an Emergency Use Authorization (EUA). This EUA will remain in effect (meaning this test can be used) for the duration of the COVID-19 declaration under Section 564(b)(1) of the Act, 21 U.S.C. section 360bbb-3(b)(1), unless the authorization is terminated  or revoked.  Performed at [facility], [place] [address] [place], [place] [address]     Studies/Results: DG Chest 2 View  Result Date: 01/03/2021 CLINICAL DATA:  Chest pain for 2 days. Nausea. Sickle cell disease. EXAM: CHEST - 2 VIEW COMPARISON:  12/08/2020 FINDINGS: The heart size and mediastinal contours are within normal limits. Bilateral Port-A-Caths remain in appropriate position. Both lungs are clear. The visualized skeletal structures are unremarkable. IMPRESSION: No active cardiopulmonary disease. Electronically Signed   By: Marlaine Hind M.D.   On: 01/03/2021 22:03    Medications: Scheduled Meds:  Chlorhexidine Gluconate Cloth  6 each Topical Daily   famotidine  20 mg Oral Q12H   HYDROmorphone   Intravenous Q4H   ketorolac  15 mg Intravenous Q6H   rivaroxaban  20 mg Oral Q supper   senna-docusate  1 tablet Oral BID   Continuous Infusions:  diphenhydrAMINE 25 mg (01/05/21 0935)   famotidine (PEPCID) IV     promethazine (PHENERGAN) injection (IM or IVPB) 12.5 mg (01/05/21 0845)   PRN Meds:.diphenhydrAMINE **OR** diphenhydrAMINE, naloxone **AND** sodium chloride flush, polyethylene glycol, promethazine (PHENERGAN) injection (IM or IVPB)  Consultants: None  Procedures: None  Antibiotics: None  Assessment/Plan: Principal Problem:   Sickle cell crisis ([place]) Active Problems:  History of pulmonary embolism   Asthma   Opioid dependence (HCC)   Chronic pain syndrome  Sickle cell disease with pain crisis: Decrease IV fluids to KVO Continue IV Dilaudid PCA without any changes in settings today Toradol 15 mg IV every 6 hours Monitor vital signs very closely, reevaluate pain scale regularly, and supplemental oxygen as needed.  Anemia of chronic disease: Today, patient's hemoglobin is 6.7, which is below her baseline of 8-9 g/dL.  Considered to be hemodilution, decrease IV fluids to [facility].  Repeat CBC in AM.  If hemoglobin is less than 6.0 g/dL,  transfuse 1 unit PRBCs.  Leukocytosis: WBCs 15.8.  Considered to be secondary to vaso-occlusive crisis.  Patient is afebrile without any signs of acute infection. Continue to monitor closely without antibiotics.  Labs in AM.  Chronic pain syndrome: Continue home medication  History of mild intermittent asthma: Stable.  Continue home medications as needed  History of PE: Continue Xarelto.  Code Status: Full Code Family Communication: N/A Disposition Plan: Not yet ready for discharge  Between, MSN, FNP-C [facility] [address] [place], [place] [address] [phone]  If 7PM-7AM, please contact night-coverage.  01/05/2021, 12:02 PM  LOS: 1 day

## 2021-01-06 DIAGNOSIS — G894 Chronic pain syndrome: Secondary | ICD-10-CM

## 2021-01-06 DIAGNOSIS — F1120 Opioid dependence, uncomplicated: Secondary | ICD-10-CM

## 2021-01-06 DIAGNOSIS — D5700 Hb-SS disease with crisis, unspecified: Secondary | ICD-10-CM | POA: Diagnosis not present

## 2021-01-06 DIAGNOSIS — D72829 Elevated white blood cell count, unspecified: Secondary | ICD-10-CM

## 2021-01-06 DIAGNOSIS — Z86711 Personal history of pulmonary embolism: Secondary | ICD-10-CM | POA: Diagnosis not present

## 2021-01-06 DIAGNOSIS — J4520 Mild intermittent asthma, uncomplicated: Secondary | ICD-10-CM

## 2021-01-06 LAB — PREPARE RBC (CROSSMATCH)

## 2021-01-06 LAB — CBC
HCT: 18.6 % — ABNORMAL LOW (ref 36.0–46.0)
Hemoglobin: 6.3 g/dL — CL (ref 12.0–15.0)
MCH: 28.5 pg (ref 26.0–34.0)
MCHC: 33.9 g/dL (ref 30.0–36.0)
MCV: 84.2 fL (ref 80.0–100.0)
Platelets: 513 10*3/uL — ABNORMAL HIGH (ref 150–400)
RBC: 2.21 MIL/uL — ABNORMAL LOW (ref 3.87–5.11)
RDW: 19.9 % — ABNORMAL HIGH (ref 11.5–15.5)
WBC: 14.3 10*3/uL — ABNORMAL HIGH (ref 4.0–10.5)
nRBC: 0.2 % (ref 0.0–0.2)

## 2021-01-06 NOTE — Progress Notes (Signed)
Patient ID: Molly Gutierrez, female   DOB: [birth year], 40 y.o.   MRN: [id number] Subjective: Molly Gutierrez is a 40 year old female with a medical history significant for sickle cell disease, chronic pain syndrome opiate dependence and tolerance, history of mild intermittent asthma, recent hospitalizations, and history of PE on Xarelto was admitted for sickle cell pain crisis.  Patient states her pain is much improved today.  She has no new complaints.  Pain is about 7/10.  Patient is resting comfortably in bed.  She denies any fever, cough, chest pain, shortness of breath, nausea, vomiting or diarrhea.  Of note, her hemoglobin dropped to 6.3 today.  Per her hematologist, she recommends to consider transfusion with hemoglobin below 7 while on admission.  Objective:  Vital signs in last 24 hours:  Vitals:   01/06/21 0445 01/06/21 0535 01/06/21 0803 01/06/21 1212  BP:  106/75    Pulse:  99    Resp: '14 18 12 12  '$ Temp:  99 F (37.2 C)    TempSrc:  Oral    SpO2: 98% 91% 98% 97%  Weight:      Height:        Intake/Output from previous day:   Intake/Output Summary (Last 24 hours) at 01/06/2021 1339 Last data filed at 01/06/2021 0900 Gross per 24 hour  Intake 950 ml  Output --  Net 950 ml    Physical Exam: General: Alert, awake, oriented x3, in no acute distress.  HEENT: [place]/AT [place], EOMI Neck: Trachea midline,  no masses, no thyromegal,y no JVD, no carotid bruit OROPHARYNX:  Moist, No exudate/ erythema/lesions.  Heart: Regular rate and rhythm, without murmurs, rubs, gallops, PMI non-displaced, no heaves or thrills on palpation.  Lungs: Clear to auscultation, no wheezing or rhonchi noted. No increased vocal fremitus resonant to percussion  Abdomen: Soft, nontender, nondistended, positive bowel sounds, no masses no hepatosplenomegaly noted..  Neuro: No focal neurological deficits noted cranial nerves II through XII grossly intact. DTRs 2+ bilaterally upper and lower  extremities. Strength 5 out of 5 in bilateral upper and lower extremities. Musculoskeletal: No warm swelling or erythema around joints, no spinal tenderness noted. Psychiatric: Patient alert and oriented x3, good insight and cognition, good recent to remote recall. Lymph node survey: No cervical axillary or inguinal lymphadenopathy noted.  Lab Results:  Basic Metabolic Panel:    Component Value Date/Time   NA 138 01/05/2021 1053   NA 137 08/02/2020 1538   K 4.1 01/05/2021 1053   CL 108 01/05/2021 1053   CO2 24 01/05/2021 1053   BUN 15 01/05/2021 1053   BUN 7 08/02/2020 1538   CREATININE 0.62 01/05/2021 1053   GLUCOSE 105 (H) 01/05/2021 1053   CALCIUM 8.4 (L) 01/05/2021 1053   CBC:    Component Value Date/Time   WBC 14.3 (H) 01/06/2021 0500   HGB 6.3 (LL) 01/06/2021 0500   HGB 7.8 (L) 08/02/2020 1538   HCT 18.6 (L) 01/06/2021 0500   HCT 22.3 (L) 08/02/2020 1538   PLT 513 (H) 01/06/2021 0500   PLT 560 (H) 08/02/2020 1538   MCV 84.2 01/06/2021 0500   MCV 92 08/02/2020 1538   NEUTROABS 12.3 (H) 01/04/2021 0030   NEUTROABS 9.5 (H) 08/02/2020 1538   LYMPHSABS 3.2 01/04/2021 0030   LYMPHSABS 3.0 08/02/2020 1538   MONOABS 2.6 (H) 01/04/2021 0030   EOSABS 0.2 01/04/2021 0030   EOSABS 0.4 08/02/2020 1538   BASOSABS 0.1 01/04/2021 0030   BASOSABS 0.1 08/02/2020 1538    Recent Results (from the  past 240 hour(s))  Resp Panel by RT-PCR (Flu A&B, Covid) Nasopharyngeal Swab     Status: None   Collection Time: 01/04/21  3:48 AM   Specimen: Nasopharyngeal Swab; Nasopharyngeal(NP) swabs in vial transport medium  Result Value Ref Range Status   SARS Coronavirus 2 by RT PCR NEGATIVE NEGATIVE Final    Comment: (NOTE) SARS-CoV-2 target nucleic acids are NOT DETECTED.  The SARS-CoV-2 RNA is generally detectable in upper respiratory specimens during the acute phase of infection. The lowest concentration of SARS-CoV-2 viral copies this assay can detect is 138 copies/mL. A negative  result does not preclude SARS-Cov-2 infection and should not be used as the sole basis for treatment or other patient management decisions. A negative result may occur with  improper specimen collection/handling, submission of specimen other than nasopharyngeal swab, presence of viral mutation(s) within the areas targeted by this assay, and inadequate number of viral copies(<138 copies/mL). A negative result must be combined with clinical observations, patient history, and epidemiological information. The expected result is Negative.  Fact Sheet for Patients:  EntrepreneurPulse.com.au  Fact Sheet for Healthcare Providers:  IncredibleEmployment.be  This test is no t yet approved or cleared by the [place] FDA and  has been authorized for detection and/or diagnosis of SARS-CoV-2 by FDA under an Emergency Use Authorization (EUA). This EUA will remain  in effect (meaning this test can be used) for the duration of the COVID-19 declaration under Section 564(b)(1) of the Act, 21 U.S.C.section 360bbb-3(b)(1), unless the authorization is terminated  or revoked sooner.       Influenza A by PCR NEGATIVE NEGATIVE Final   Influenza B by PCR NEGATIVE NEGATIVE Final    Comment: (NOTE) The Xpert Xpress SARS-CoV-2/FLU/RSV plus assay is intended as an aid in the diagnosis of influenza from Nasopharyngeal swab specimens and should not be used as a sole basis for treatment. Nasal washings and aspirates are unacceptable for Xpert Xpress SARS-CoV-2/FLU/RSV testing.  Fact Sheet for Patients: EntrepreneurPulse.com.au  Fact Sheet for Healthcare Providers: IncredibleEmployment.be  This test is not yet approved or cleared by the [place] FDA and has been authorized for detection and/or diagnosis of SARS-CoV-2 by FDA under an Emergency Use Authorization (EUA). This EUA will remain in effect (meaning this test can be used)  for the duration of the COVID-19 declaration under Section 564(b)(1) of the Act, 21 U.S.C. section 360bbb-3(b)(1), unless the authorization is terminated or revoked.  Performed at [facility], [place] [address] [place], [place] [address]     Studies/Results: No results found.  Medications: Scheduled Meds:  Chlorhexidine Gluconate Cloth  6 each Topical Daily   famotidine  20 mg Oral Q12H   HYDROmorphone   Intravenous Q4H   ketorolac  15 mg Intravenous Q6H   rivaroxaban  20 mg Oral Q supper   senna-docusate  1 tablet Oral BID   Continuous Infusions:  diphenhydrAMINE 25 mg (01/06/21 1110)   famotidine (PEPCID) IV     promethazine (PHENERGAN) injection (IM or IVPB) 12.5 mg (01/06/21 0456)   PRN Meds:.diphenhydrAMINE **OR** diphenhydrAMINE, naloxone **AND** sodium chloride flush, polyethylene glycol, promethazine (PHENERGAN) injection (IM or IVPB)  Consultants: None  Procedures: None  Antibiotics: None  Assessment/Plan: Principal Problem:   Sickle cell crisis ([place]) Active Problems:   Leukocytosis   History of pulmonary embolism   Asthma   Opioid dependence (HCC)   Chronic pain syndrome  Hb Sickle Cell Disease with crisis: Continue [facility]F at [facility], continue weight based Dilaudid PCA at current dose  setting, continue IV Toradol 15 mg Q 6 H for total of 5 days, continue oral home medications as ordered.  Monitor vitals very closely, Re-evaluate pain scale regularly, 2 L of Oxygen by [place]. Leukocytosis: This is most likely due to vaso-occlusive crisis. Patient is afebrile and no other sign of infection or inflammation.  We will monitor closely without antibiotics at this time.  Repeat labs in AM. Sickle Cell Anemia: Hemoglobin is 6.3 today which is a significant drop from her baseline of 8-9.  Her hematologist recommends that patient be transfused if hemoglobin drops below 7 while on admission.  For this reason I will transfuse patient with 1 unit of packed red  blood cells today. Chronic pain Syndrome: Continue home medications as ordered. History of pulmonary embolism: Patient is on Xarelto at home, will continue.  Code Status: Full Code Family Communication: N/A Disposition Plan: For possible discharge tomorrow  Molly Gutierrez  If 7PM-7AM, please contact night-coverage.  01/06/2021, 1:39 PM  LOS: 2 days

## 2021-01-06 NOTE — Plan of Care (Signed)

## 2021-01-07 DIAGNOSIS — G894 Chronic pain syndrome: Secondary | ICD-10-CM | POA: Diagnosis not present

## 2021-01-07 DIAGNOSIS — Z86711 Personal history of pulmonary embolism: Secondary | ICD-10-CM | POA: Diagnosis not present

## 2021-01-07 DIAGNOSIS — D5700 Hb-SS disease with crisis, unspecified: Secondary | ICD-10-CM | POA: Diagnosis not present

## 2021-01-07 DIAGNOSIS — J4520 Mild intermittent asthma, uncomplicated: Secondary | ICD-10-CM | POA: Diagnosis not present

## 2021-01-07 LAB — CBC WITH DIFFERENTIAL/PLATELET
Abs Immature Granulocytes: 0.05 10*3/uL (ref 0.00–0.07)
Basophils Absolute: 0.1 10*3/uL (ref 0.0–0.1)
Basophils Relative: 1 %
Eosinophils Absolute: 0.7 10*3/uL — ABNORMAL HIGH (ref 0.0–0.5)
Eosinophils Relative: 5 %
HCT: 23 % — ABNORMAL LOW (ref 36.0–46.0)
Hemoglobin: 8.2 g/dL — ABNORMAL LOW (ref 12.0–15.0)
Immature Granulocytes: 0 %
Lymphocytes Relative: 26 %
Lymphs Abs: 3.4 10*3/uL (ref 0.7–4.0)
MCH: 29.7 pg (ref 26.0–34.0)
MCHC: 35.7 g/dL (ref 30.0–36.0)
MCV: 83.3 fL (ref 80.0–100.0)
Monocytes Absolute: 1.8 10*3/uL — ABNORMAL HIGH (ref 0.1–1.0)
Monocytes Relative: 13 %
Neutro Abs: 7.3 10*3/uL (ref 1.7–7.7)
Neutrophils Relative %: 55 %
Platelets: 553 10*3/uL — ABNORMAL HIGH (ref 150–400)
RBC: 2.76 MIL/uL — ABNORMAL LOW (ref 3.87–5.11)
RDW: 19.2 % — ABNORMAL HIGH (ref 11.5–15.5)
WBC: 13.2 10*3/uL — ABNORMAL HIGH (ref 4.0–10.5)
nRBC: 0.3 % — ABNORMAL HIGH (ref 0.0–0.2)

## 2021-01-07 MED ORDER — HEPARIN SOD (PORK) LOCK FLUSH 100 UNIT/ML IV SOLN
500.0000 [IU] | INTRAVENOUS | Status: DC | PRN
  Filled 2021-01-07: qty 5

## 2021-01-07 MED ORDER — HYDROMORPHONE HCL 4 MG PO TABS: 4.0000 mg | ORAL_TABLET | Freq: Four times a day (QID) | ORAL | 0 refills | Status: DC | PRN

## 2021-01-07 NOTE — Discharge Summary (Signed)
Physician Discharge Summary  [facility] [id number] DOB: [birth year] DOA: 01/03/2021  PCP: Vevelyn Francois, NP  Admit date: 01/03/2021  Discharge date: 01/07/2021  Discharge Diagnoses:  Principal Problem:   Sickle cell crisis ([place]) Active Problems:   Leukocytosis   History of pulmonary embolism   Asthma   Opioid dependence ([place])   Chronic pain syndrome   Discharge Condition: Stable  Disposition:   Follow-up Information     Vevelyn Francois, NP. Schedule an appointment as soon as possible for a visit in 1 week(s).   Specialty: Adult Health Nurse Practitioner Why: As needed Contact information: [address] Renee Harder [place] [id number] [phone]                Pt is discharged home in good condition and is to follow up with Vevelyn Francois, NP this week to have labs evaluated. Molly Gutierrez is instructed to increase activity slowly and balance with rest for the next few days, and use prescribed medication to complete treatment of pain  Diet: Regular Wt Readings from Last 3 Encounters:  01/03/21 56.7 kg  12/08/20 56.9 kg  11/21/20 57.2 kg    History of present illness:  Molly Gutierrez is a 40 y.o. female with medical history significant of sickle cell anemia, PE on xarelto. Presenting with chest pain, N/V. Her symptoms started 2 days ago. It started with central throbbing/achy chest pain that was non-radiating and constant. It was typical of her normal pain crisis. She tried her home meds, but they did not help. She is not aware of anything else making it worse. She started having N/V around the same time. There is no one in her house with similar symptoms. No recent diet changes. She had no fever or diarrhea. Her pain became intolerable, so she came to the ED for help. She denies any other aggravating or alleviating symptoms.       ED Course: She was found to be in crisis. CXR was clear. She was started on fluids, pain meds. [facility] was called for  admission.   Hospital Course:  Patient was admitted for sickle cell pain crisis and managed appropriately with IVF, IV Dilaudid via PCA and IV Toradol, as well as other adjunct therapies per sickle cell pain management protocols.  Hemoglobin dropped to a nadir of 6.3, below her baseline of 8-9 for which she was transfused with 1 unit of packed red blood cell with improved in hemoglobin to 8.2 at the time of discharge. Patient did well on above regimen, pain returned to baseline. She was tolerating p.o. intake and ambulating well with no restrictions.  Patient was therefore discharged home today in a hemodynamically stable condition.  She was given prescription of her home medications and encouraged to follow-up as follows  Molly Gutierrez will follow-up with PCP within 1 week of this discharge.  She is encouraged to follow-up with a hematologist and to start [facility] as scheduled.  Molly Gutierrez was counseled extensively about nonpharmacologic means of pain management, patient verbalized understanding and was appreciative of  the care received during this admission.   We discussed the need for good hydration, monitoring of hydration status, avoidance of heat, cold, stress, and infection triggers. We discussed the need to be adherent with taking Hydrea and other home medications. Patient was reminded of the need to seek medical attention immediately if any symptom of bleeding, anemia, or infection occurs.  Discharge Exam: Vitals:   01/07/21 0830 01/07/21 0944  BP:  95/84  Pulse:  64  Resp: 10 18  Temp:  98.6 F (37 C)  SpO2: 97% 96%   Vitals:   01/07/21 0438 01/07/21 0519 01/07/21 0830 01/07/21 0944  BP: 104/78   95/84  Pulse: 80   64  Resp: '14 16 10 18  '$ Temp: 98.7 F (37.1 C)   98.6 F (37 C)  TempSrc: Oral   Oral  SpO2: 97% 98% 97% 96%  Weight:      Height:        General appearance : Awake, alert, not in any distress. Speech Clear. Not toxic looking HEENT: Atraumatic and Normocephalic,  pupils equally reactive to light and accomodation Neck: Supple, no JVD. No cervical lymphadenopathy.  Chest: Good air entry bilaterally, no added sounds  CVS: S1 S2 regular, no murmurs.  Abdomen: Bowel sounds present, Non tender and not distended with no gaurding, rigidity or rebound. Extremities: B/L Lower Ext shows no edema, both legs are warm to touch Neurology: Awake alert, and oriented X 3, CN II-XII intact, Non focal Skin: No Rash  Discharge Instructions  Discharge Instructions     Diet - low sodium heart healthy   Complete by: As directed    Increase activity slowly   Complete by: As directed       Allergies as of 01/07/2021       Reactions   Cefaclor Hives, Swelling   Hydroxyurea Palpitations, Other (See Comments)   Lowers "blood levels" and heart rate (causes HYPOtension); "it messes me up, it drops my levels and stuff"   Omeprazole Anaphylaxis, Other (See Comments)   Causes "sharp pains in the stomach"   Ketamine Palpitations, Other (See Comments)   "Pt states she has had previous reaction to ketamine. States she becomes flushed, heart races, dizzy, and feels like she is going to pass out."        Medication List     TAKE these medications    albuterol 108 (90 Base) MCG/ACT inhaler Commonly known as: VENTOLIN HFA Inhale 2 puffs into the lungs 2 (two) times daily as needed for wheezing or shortness of breath.   Deferasirox 360 MG Tabs Commonly known as: Jadenu Take 3 tablets (1,080 mg total) by mouth daily before breakfast.   diphenhydrAMINE 25 mg capsule Commonly known as: BENADRYL Take 25 mg by mouth 3 (three) times daily as needed for itching.   folic acid 1 MG tablet Commonly known as: FOLVITE Take 1 tablet (1 mg total) by mouth daily.   gabapentin 300 MG capsule Commonly known as: NEURONTIN Take 1 capsule (300 mg total) by mouth 3 (three) times daily.   HYDROmorphone 4 MG tablet Commonly known as: DILAUDID Take 1 tablet (4 mg total) by mouth  every 6 (six) hours as needed for up to 15 days for moderate pain or severe pain. Start taking on: January 09, 2021 What changed: These instructions start on January 09, 2021. If you are unsure what to do until then, ask your doctor or other care provider.   mirtazapine 45 MG tablet Commonly known as: REMERON Take 1 tablet (45 mg total) by mouth at bedtime.   mometasone-formoterol 100-5 MCG/ACT Aero Commonly known as: DULERA Inhale 2 puffs into the lungs daily as needed for wheezing or shortness of breath.   omeprazole 20 MG capsule Commonly known as: PRILOSEC Take 1 capsule (20 mg total) by mouth 2 (two) times daily before a meal. What changed: when to take this   polyethylene glycol 17 g packet Commonly known as: MIRALAX /  GLYCOLAX Take 17 g by mouth daily as needed for mild constipation (MIX AND DRINK).   promethazine 25 MG tablet Commonly known as: PHENERGAN Take 0.5-1 tablets (12.5-25 mg total) by mouth every 6 (six) hours as needed for nausea or vomiting.   vitamin B-12 1000 MCG tablet Commonly known as: CYANOCOBALAMIN Take 1 tablet (1,000 mcg total) by mouth daily.   Vitamin D3 25 MCG (1000 UT) Caps Take 1 capsule (1,000 Units total) by mouth daily.   Xarelto 20 MG Tabs tablet Generic drug: rivaroxaban Take 1 tablet (20 mg total) by mouth daily with supper.        The results of significant diagnostics from this hospitalization (including imaging, microbiology, ancillary and laboratory) are listed below for reference.    Significant Diagnostic Studies: DG Chest 2 View  Result Date: 01/03/2021 CLINICAL DATA:  Chest pain for 2 days. Nausea. Sickle cell disease. EXAM: CHEST - 2 VIEW COMPARISON:  12/08/2020 FINDINGS: The heart size and mediastinal contours are within normal limits. Bilateral Port-A-Caths remain in appropriate position. Both lungs are clear. The visualized skeletal structures are unremarkable. IMPRESSION: No active cardiopulmonary disease. Electronically  Signed   By: Marlaine Hind M.D.   On: 01/03/2021 22:03    Microbiology: Recent Results (from the past 240 hour(s))  Resp Panel by RT-PCR (Flu A&B, Covid) Nasopharyngeal Swab     Status: None   Collection Time: 01/04/21  3:48 AM   Specimen: Nasopharyngeal Swab; Nasopharyngeal(NP) swabs in vial transport medium  Result Value Ref Range Status   SARS Coronavirus 2 by RT PCR NEGATIVE NEGATIVE Final    Comment: (NOTE) SARS-CoV-2 target nucleic acids are NOT DETECTED.  The SARS-CoV-2 RNA is generally detectable in upper respiratory specimens during the acute phase of infection. The lowest concentration of SARS-CoV-2 viral copies this assay can detect is 138 copies/mL. A negative result does not preclude SARS-Cov-2 infection and should not be used as the sole basis for treatment or other patient management decisions. A negative result may occur with  improper specimen collection/handling, submission of specimen other than nasopharyngeal swab, presence of viral mutation(s) within the areas targeted by this assay, and inadequate number of viral copies(<138 copies/mL). A negative result must be combined with clinical observations, patient history, and epidemiological information. The expected result is Negative.  Fact Sheet for Patients:  EntrepreneurPulse.com.au  Fact Sheet for Healthcare Providers:  IncredibleEmployment.be  This test is no t yet approved or cleared by the [place] FDA and  has been authorized for detection and/or diagnosis of SARS-CoV-2 by FDA under an Emergency Use Authorization (EUA). This EUA will remain  in effect (meaning this test can be used) for the duration of the COVID-19 declaration under Section 564(b)(1) of the Act, 21 U.S.C.section 360bbb-3(b)(1), unless the authorization is terminated  or revoked sooner.       Influenza A by PCR NEGATIVE NEGATIVE Final   Influenza B by PCR NEGATIVE NEGATIVE Final    Comment:  (NOTE) The Xpert Xpress SARS-CoV-2/FLU/RSV plus assay is intended as an aid in the diagnosis of influenza from Nasopharyngeal swab specimens and should not be used as a sole basis for treatment. Nasal washings and aspirates are unacceptable for Xpert Xpress SARS-CoV-2/FLU/RSV testing.  Fact Sheet for Patients: EntrepreneurPulse.com.au  Fact Sheet for Healthcare Providers: IncredibleEmployment.be  This test is not yet approved or cleared by the [place] FDA and has been authorized for detection and/or diagnosis of SARS-CoV-2 by FDA under an Emergency Use Authorization (EUA). This EUA will remain in  effect (meaning this test can be used) for the duration of the COVID-19 declaration under Section 564(b)(1) of the Act, 21 U.S.C. section 360bbb-3(b)(1), unless the authorization is terminated or revoked.  Performed at [facility], [place] [address] [place], [place] [address]      Labs: Basic Metabolic Panel: Recent Labs  Lab 01/04/21 0030 01/05/21 1053  NA 139 138  K 3.2* 4.1  CL 109 108  CO2 22 24  GLUCOSE 100* 105*  BUN 12 15  CREATININE 0.55 0.62  CALCIUM 9.2 8.4*  MG 2.3  --    Liver Function Tests: Recent Labs  Lab 01/04/21 0030  AST 20  ALT 16  ALKPHOS 64  BILITOT 4.0*  PROT 8.9*  ALBUMIN 5.0   No results for input(s): LIPASE, AMYLASE in the last 168 hours. No results for input(s): AMMONIA in the last 168 hours. CBC: Recent Labs  Lab 01/04/21 0030 01/05/21 1053 01/06/21 0500 01/07/21 0509  WBC 18.4* 15.8* 14.3* 13.2*  NEUTROABS 12.3*  --   --  7.3  HGB 8.5* 6.7* 6.3* 8.2*  HCT 25.3* 19.2* 18.6* 23.0*  MCV 85.2 83.1 84.2 83.3  PLT 625* 545* 513* 553*   Cardiac Enzymes: No results for input(s): CKTOTAL, CKMB, CKMBINDEX, TROPONINI in the last 168 hours. BNP: Invalid input(s): POCBNP CBG: No results for input(s): GLUCAP in the last 168 hours.  Time coordinating discharge: 50  minutes  Signed:  [facility] 01/07/2021, 11:51 AM

## 2021-01-08 LAB — TYPE AND SCREEN
ABO/RH(D): A POS
Antibody Screen: NEGATIVE
Unit division: 0

## 2021-01-08 LAB — BPAM RBC
Blood Product Expiration Date: 202208012359
ISSUE DATE / TIME: 202207231623
Unit Type and Rh: 9500

## 2021-01-17 ENCOUNTER — Telehealth: Payer: Self-pay

## 2021-01-17 NOTE — Telephone Encounter (Signed)
Hydromorphone [facility] in [place]

## 2021-01-18 ENCOUNTER — Other Ambulatory Visit: Payer: Self-pay | Admitting: Nurse Practitioner

## 2021-01-18 MED ORDER — HYDROMORPHONE HCL 4 MG PO TABS: 4.0000 mg | ORAL_TABLET | Freq: Four times a day (QID) | ORAL | 0 refills | Status: DC | PRN

## 2021-02-01 ENCOUNTER — Telehealth: Payer: Self-pay

## 2021-02-01 NOTE — Telephone Encounter (Signed)
hydromorphone

## 2021-02-02 ENCOUNTER — Other Ambulatory Visit: Payer: Self-pay | Admitting: Nurse Practitioner

## 2021-02-02 MED ORDER — HYDROMORPHONE HCL 4 MG PO TABS: 4.0000 mg | ORAL_TABLET | Freq: Four times a day (QID) | ORAL | 0 refills | Status: DC | PRN

## 2021-02-12 ENCOUNTER — Ambulatory Visit: Payer: Medicare Other | Admitting: Nurse Practitioner

## 2021-02-16 ENCOUNTER — Telehealth: Payer: Self-pay

## 2021-02-16 NOTE — Telephone Encounter (Signed)
Hydromorphone  [facility] in [place]

## 2021-02-17 ENCOUNTER — Encounter (HOSPITAL_COMMUNITY): Payer: Self-pay

## 2021-02-17 ENCOUNTER — Inpatient Hospital Stay (HOSPITAL_COMMUNITY)
Admission: EM | Admit: 2021-02-17 | Discharge: 2021-02-20 | DRG: 812 | Disposition: A | Discharge status: Home / Self Care | Payer: Medicare Other | Attending: Internal Medicine | Admitting: Internal Medicine

## 2021-02-17 ENCOUNTER — Other Ambulatory Visit: Payer: Self-pay

## 2021-02-17 ENCOUNTER — Inpatient Hospital Stay (HOSPITAL_COMMUNITY): Payer: Medicare Other

## 2021-02-17 DIAGNOSIS — G894 Chronic pain syndrome: Secondary | ICD-10-CM | POA: Diagnosis not present

## 2021-02-17 DIAGNOSIS — Z7901 Long term (current) use of anticoagulants: Secondary | ICD-10-CM

## 2021-02-17 DIAGNOSIS — Z888 Allergy status to other drugs, medicaments and biological substances status: Secondary | ICD-10-CM

## 2021-02-17 DIAGNOSIS — Z9851 Tubal ligation status: Secondary | ICD-10-CM | POA: Diagnosis not present

## 2021-02-17 DIAGNOSIS — L309 Dermatitis, unspecified: Secondary | ICD-10-CM | POA: Diagnosis present

## 2021-02-17 DIAGNOSIS — K219 Gastro-esophageal reflux disease without esophagitis: Secondary | ICD-10-CM

## 2021-02-17 DIAGNOSIS — Z79899 Other long term (current) drug therapy: Secondary | ICD-10-CM

## 2021-02-17 DIAGNOSIS — Z86711 Personal history of pulmonary embolism: Secondary | ICD-10-CM | POA: Diagnosis not present

## 2021-02-17 DIAGNOSIS — Z20822 Contact with and (suspected) exposure to covid-19: Secondary | ICD-10-CM | POA: Diagnosis present

## 2021-02-17 DIAGNOSIS — D72829 Elevated white blood cell count, unspecified: Secondary | ICD-10-CM | POA: Diagnosis not present

## 2021-02-17 DIAGNOSIS — I2699 Other pulmonary embolism without acute cor pulmonale: Secondary | ICD-10-CM | POA: Diagnosis present

## 2021-02-17 DIAGNOSIS — D5700 Hb-SS disease with crisis, unspecified: Secondary | ICD-10-CM | POA: Diagnosis present

## 2021-02-17 DIAGNOSIS — J4520 Mild intermittent asthma, uncomplicated: Secondary | ICD-10-CM

## 2021-02-17 DIAGNOSIS — J45909 Unspecified asthma, uncomplicated: Secondary | ICD-10-CM | POA: Diagnosis present

## 2021-02-17 LAB — RETICULOCYTES
Immature Retic Fract: 26.7 % — ABNORMAL HIGH (ref 2.3–15.9)
RBC.: 2.94 MIL/uL — ABNORMAL LOW (ref 3.87–5.11)
Retic Count, Absolute: 380.4 10*3/uL — ABNORMAL HIGH (ref 19.0–186.0)
Retic Ct Pct: 12.9 % — ABNORMAL HIGH (ref 0.4–3.1)

## 2021-02-17 LAB — CBC WITH DIFFERENTIAL/PLATELET
Abs Immature Granulocytes: 0.09 10*3/uL — ABNORMAL HIGH (ref 0.00–0.07)
Basophils Absolute: 0.1 10*3/uL (ref 0.0–0.1)
Basophils Relative: 0 %
Eosinophils Absolute: 0.1 10*3/uL (ref 0.0–0.5)
Eosinophils Relative: 1 %
HCT: 25.3 % — ABNORMAL LOW (ref 36.0–46.0)
Hemoglobin: 8.5 g/dL — ABNORMAL LOW (ref 12.0–15.0)
Immature Granulocytes: 1 %
Lymphocytes Relative: 13 %
Lymphs Abs: 2.1 10*3/uL (ref 0.7–4.0)
MCH: 28.9 pg (ref 26.0–34.0)
MCHC: 33.6 g/dL (ref 30.0–36.0)
MCV: 86.1 fL (ref 80.0–100.0)
Monocytes Absolute: 1.6 10*3/uL — ABNORMAL HIGH (ref 0.1–1.0)
Monocytes Relative: 10 %
Neutro Abs: 11.8 10*3/uL — ABNORMAL HIGH (ref 1.7–7.7)
Neutrophils Relative %: 75 %
Platelets: 686 10*3/uL — ABNORMAL HIGH (ref 150–400)
RBC: 2.94 MIL/uL — ABNORMAL LOW (ref 3.87–5.11)
RDW: 20.1 % — ABNORMAL HIGH (ref 11.5–15.5)
WBC: 15.7 10*3/uL — ABNORMAL HIGH (ref 4.0–10.5)
nRBC: 0.9 % — ABNORMAL HIGH (ref 0.0–0.2)

## 2021-02-17 LAB — RESP PANEL BY RT-PCR (FLU A&B, COVID) ARPGX2
Influenza A by PCR: NEGATIVE
Influenza B by PCR: NEGATIVE
SARS Coronavirus 2 by RT PCR: NEGATIVE

## 2021-02-17 LAB — MAGNESIUM: Magnesium: 2.5 mg/dL — ABNORMAL HIGH (ref 1.7–2.4)

## 2021-02-17 LAB — BASIC METABOLIC PANEL
Anion gap: 8 (ref 5–15)
BUN: 9 mg/dL (ref 6–20)
CO2: 23 mmol/L (ref 22–32)
Calcium: 9.6 mg/dL (ref 8.9–10.3)
Chloride: 110 mmol/L (ref 98–111)
Creatinine, Ser: <0.3 mg/dL — ABNORMAL LOW (ref 0.44–1.00)
Glucose, Bld: 100 mg/dL — ABNORMAL HIGH (ref 70–99)
Potassium: 3.5 mmol/L (ref 3.5–5.1)
Sodium: 141 mmol/L (ref 135–145)

## 2021-02-17 LAB — I-STAT BETA HCG BLOOD, ED (MC, WL, AP ONLY): I-stat hCG, quantitative: <5 m[IU]/mL (ref <5)

## 2021-02-17 MED ORDER — DIPHENHYDRAMINE HCL 50 MG/ML IJ SOLN
25.0000 mg | Freq: Once | INTRAMUSCULAR | Status: AC
  Administered 2021-02-17: 25 mg via INTRAVENOUS
  Filled 2021-02-17: qty 1

## 2021-02-17 MED ORDER — HYDROMORPHONE 1 MG/ML IV SOLN
INTRAVENOUS | Status: DC
Start: 2021-02-18 — End: 2021-02-18
  Administered 2021-02-18: 3.6 mg via INTRAVENOUS
  Administered 2021-02-18: 4.2 mg via INTRAVENOUS
  Administered 2021-02-18: 30 mg via INTRAVENOUS
  Administered 2021-02-18: 1.2 mg via INTRAVENOUS
  Filled 2021-02-17: qty 30

## 2021-02-17 MED ORDER — NALOXONE HCL 0.4 MG/ML IJ SOLN: 0.4000 mg | INTRAMUSCULAR | Status: DC | PRN

## 2021-02-17 MED ORDER — ACETAMINOPHEN 325 MG PO TABS: 650.0000 mg | ORAL_TABLET | Freq: Four times a day (QID) | ORAL | Status: DC | PRN

## 2021-02-17 MED ORDER — ONDANSETRON HCL 4 MG/2ML IJ SOLN
4.0000 mg | Freq: Four times a day (QID) | INTRAMUSCULAR | Status: DC | PRN
  Administered 2021-02-17: 4 mg via INTRAVENOUS
  Filled 2021-02-17: qty 2

## 2021-02-17 MED ORDER — DIPHENHYDRAMINE HCL 25 MG PO CAPS: 25.0000 mg | ORAL_CAPSULE | ORAL | Status: DC | PRN

## 2021-02-17 MED ORDER — ACETAMINOPHEN 650 MG RE SUPP: 650.0000 mg | Freq: Four times a day (QID) | RECTAL | Status: DC | PRN

## 2021-02-17 MED ORDER — HYDROMORPHONE HCL 2 MG/ML IJ SOLN
2.0000 mg | INTRAMUSCULAR | Status: AC
  Administered 2021-02-17: 2 mg via INTRAVENOUS
  Filled 2021-02-17: qty 1

## 2021-02-17 MED ORDER — SODIUM CHLORIDE 0.45 % IV SOLN: INTRAVENOUS | Status: DC

## 2021-02-17 MED ORDER — SODIUM CHLORIDE 0.9 % IV SOLN
25.0000 mg | INTRAVENOUS | Status: DC | PRN
  Administered 2021-02-18 – 2021-02-20 (×6): 25 mg via INTRAVENOUS
  Filled 2021-02-17 (×2): qty 25
  Filled 2021-02-17: qty 0.5
  Filled 2021-02-17 (×4): qty 25

## 2021-02-17 MED ORDER — SENNOSIDES-DOCUSATE SODIUM 8.6-50 MG PO TABS
1.0000 | ORAL_TABLET | Freq: Two times a day (BID) | ORAL | Status: DC
  Administered 2021-02-18 – 2021-02-20 (×4): 1 via ORAL
  Filled 2021-02-17 (×3): qty 1

## 2021-02-17 MED ORDER — RIVAROXABAN 20 MG PO TABS
20.0000 mg | ORAL_TABLET | Freq: Every day | ORAL | Status: DC
  Administered 2021-02-17 – 2021-02-19 (×3): 20 mg via ORAL
  Filled 2021-02-17 (×3): qty 1

## 2021-02-17 MED ORDER — MIRTAZAPINE 30 MG PO TABS
45.0000 mg | ORAL_TABLET | Freq: Every evening | ORAL | Status: DC | PRN
  Administered 2021-02-18 – 2021-02-19 (×2): 45 mg via ORAL
  Filled 2021-02-17 (×2): qty 1

## 2021-02-17 MED ORDER — POLYETHYLENE GLYCOL 3350 17 G PO PACK: 17.0000 g | PACK | Freq: Every day | ORAL | Status: DC | PRN

## 2021-02-17 MED ORDER — PANTOPRAZOLE SODIUM 40 MG PO TBEC: 40.0000 mg | DELAYED_RELEASE_TABLET | Freq: Every day | ORAL | Status: DC

## 2021-02-17 MED ORDER — VOXELOTOR 500 MG PO TABS: 1500.0000 mg | ORAL_TABLET | Freq: Every day | ORAL | Status: DC

## 2021-02-17 MED ORDER — HYDROMORPHONE HCL 2 MG/ML IJ SOLN
2.0000 mg | Freq: Once | INTRAMUSCULAR | Status: AC
Start: 2021-02-17 — End: 2021-02-17
  Administered 2021-02-17: 2 mg via INTRAVENOUS
  Filled 2021-02-17: qty 1

## 2021-02-17 MED ORDER — OXYCODONE HCL 5 MG PO TABS
15.0000 mg | ORAL_TABLET | Freq: Once | ORAL | Status: AC
  Administered 2021-02-17: 15 mg via ORAL
  Filled 2021-02-17: qty 3

## 2021-02-17 MED ORDER — SODIUM CHLORIDE 0.9% FLUSH: 9.0000 mL | INTRAVENOUS | Status: DC | PRN

## 2021-02-17 MED ORDER — HYDROMORPHONE HCL 2 MG/ML IJ SOLN
2.0000 mg | INTRAMUSCULAR | Status: DC | PRN
  Administered 2021-02-17: 2 mg via INTRAVENOUS
  Filled 2021-02-17 (×2): qty 1

## 2021-02-17 MED ORDER — ALBUTEROL SULFATE HFA 108 (90 BASE) MCG/ACT IN AERS
1.0000 | INHALATION_SPRAY | RESPIRATORY_TRACT | Status: DC | PRN
  Filled 2021-02-17: qty 6.7

## 2021-02-17 NOTE — ED Triage Notes (Signed)
Pt reports pain to lower back and bilateral legs since last night, states she is in a crisis. Denies cp or sob

## 2021-02-17 NOTE — ED Provider Notes (Signed)
[facility] DEPT Provider Note   CSN: [id number] Arrival date & time: 02/17/21  1517     History Chief Complaint  Patient presents with   Sickle Cell Pain Crisis    Molly Gutierrez is a 40 y.o. female with history of sickle cell anemia, chronic pain syndrome, pulmonary embolism on Xarelto.  Presents to emergency department with chief complaint of sickle cell pain crisis.  Patient reports that she has pain to lower back and bilateral lower extremities.  Patient reports that pain started on Wednesday has been constant since then.  Pain became worse yesterday evening.  Patient rates pain 9/10 on the pain scale.  No aggravating factors.  Minimal relief with home 4 mg Dilaudid.  Patient reports that triggers for sickle cell pain crisis include stress, weather change, and none of rest.  Patient reports that she has been under increased stress and after resting as much due to kids starting school and various activities.  Patient denies any recent falls, trying injuries, or heavy lifting.  Patient denies any IV drug use, cancer treatment.  Patient denies any fever, chills, chest pain, shortness of breath, numbness, weakness, saddle anesthesia, bowel or bladder dysfunction, night sweats, unexpected weight loss.  Per chart review patient was admitted for sickle cell pain crisis at hospital in [place] from 8/23 to 8/26.  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   Sickle cell anemia with pain ([place]) 04/24/2020   Mild intermittent asthma 03/31/2020   Sickle cell anemia with crisis ([place]) 01/07/2020   Sickle cell crisis acute chest syndrome ([place]) 07/29/2019   Drug-seeking behavior 07/07/2019   Sinus tachycardia 07/07/2019   Therapeutic opioid-induced constipation (OIC) 07/07/2019   Breast mass in female 06/29/2019   Atelectasis of right lung 06/29/2019   Sickle cell crisis ([place]) 06/27/2019   Sickle cell pain crisis ([place]) 04/19/2019   Pneumonia 03/27/2019   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   Anterior chest wall pain 11/13/2018   Epigastric abdominal pain 11/13/2018   Hematuria 11/13/2018   Hyperbilirubinemia 11/12/2018   Long term current use of anticoagulant therapy 11/12/2018   History of pulmonary embolism 09/26/2018   Hx of pulmonary embolism ([place]) 09/26/2018   Opioid dependence ([place]) 09/13/2018   Port-A-Cath in place 09/13/2018   Narcotic abuse, continuous ([place]) 08/28/2018   Anemia 07/03/2018   Personal history of other venous thrombosis and embolism 07/03/2018   History of transfusion 07/03/2018   Gastro-esophageal reflux disease without esophagitis 06/02/2018   Asthma 05/19/2018   Anxiety and depression 10/30/2017   At risk for sepsis 06/13/2017   Mitral regurgitation 02/01/2014   Itching 12/13/2013   Nausea & vomiting 12/13/2013   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   Frequent complaints of pain 11/01/2013   Hypokalemia 09/19/2013   S/P total hip arthroplasty 05/19/2013   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, pelvic region and thigh 05/12/2013   Lower urinary tract infectious disease 03/02/2013   Chest pain 11/14/2012   Sickle-cell anemia ([place]) 10/30/2012   Pain management 08/01/2012   Reticulocytosis 07/31/2012   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     Family History  Problem Relation Age of Onset   Renal Disease Mother    Hypertension Mother    High Cholesterol Mother     Social History   Tobacco Use   Smoking status: Never   Smokeless tobacco: Never  Vaping Use   Vaping Use: Never used  Substance Use Topics   Alcohol use: Never   Drug use: Never    Home Medications Prior to Admission medications   Medication Sig Start Date End Date Taking? Authorizing Provider  albuterol (VENTOLIN HFA) 108 (90 Base) MCG/ACT inhaler Inhale 2 puffs into the lungs 2 (two) times daily as needed for wheezing or shortness of breath. 04/13/20 04/13/21  Vevelyn Francois, NP  Cholecalciferol (VITAMIN D3) 25 MCG (1000 UT) CAPS Take 1 capsule (1,000 Units total) by mouth daily. 04/13/20 04/13/21  Vevelyn Francois, NP  Deferasirox (JADENU) 360 MG TABS Take 3 tablets (1,080 mg total) by mouth daily before breakfast. 04/13/20 04/13/21  Vevelyn Francois, NP  diphenhydrAMINE (BENADRYL) 25 mg capsule Take 25 mg by mouth 3 (three) times daily as needed for itching.    [provider]  folic acid (FOLVITE) 1 MG tablet Take 1 tablet (1 mg total) by mouth daily. 04/13/20 04/13/21  Vevelyn Francois, NP  gabapentin (NEURONTIN) 300 MG capsule Take 1 capsule (300 mg total) by mouth 3 (three) times daily. 04/13/20 04/13/21  Vevelyn Francois, NP  HYDROmorphone (DILAUDID) 4 MG tablet Take 1 tablet (4 mg total) by mouth every 6 (six) hours as needed for up to 15 days for moderate pain or severe pain. 02/08/21 02/23/21  Vevelyn Francois, NP  mirtazapine (REMERON) 45 MG tablet Take 1 tablet (45 mg total) by mouth  at bedtime. 04/13/20 04/13/21  Vevelyn Francois, NP  mometasone-formoterol (DULERA) 100-5 MCG/ACT AERO Inhale 2 puffs into the lungs daily as needed for wheezing or shortness of breath. 04/13/20 04/13/21  Vevelyn Francois, NP  omeprazole (PRILOSEC) 20 MG capsule Take 1 capsule (20 mg total) by mouth 2 (two) times daily before a meal. 08/02/20 08/02/21  Vevelyn Francois, NP  polyethylene glycol (MIRALAX / GLYCOLAX) 17 g packet Take 17 g by mouth daily as needed for mild constipation (MIX AND DRINK).    [provider]  promethazine (PHENERGAN) 25 MG tablet Take 0.5-1 tablets (12.5-25 mg total) by mouth every 6 (six) hours as needed for nausea or vomiting. 10/25/20 10/25/21  Vevelyn Francois, NP  vitamin B-12 (CYANOCOBALAMIN) 1000 MCG tablet Take 1 tablet (1,000 mcg total) by mouth daily. 04/13/20 04/13/21  Vevelyn Francois, NP  XARELTO 20 MG TABS tablet Take 1 tablet (20 mg total) by mouth daily with supper. 10/25/20 10/25/21  Vevelyn Francois,  NP    Allergies    Cefaclor, Hydroxyurea, Omeprazole, and Ketamine  Review of Systems   Review of Systems  Constitutional:  Negative for chills and fever.  Eyes:  Negative for visual disturbance.  Respiratory:  Negative for shortness of breath.   Cardiovascular:  Negative for chest pain and leg swelling.  Gastrointestinal:  Negative for abdominal pain, nausea and vomiting.  Genitourinary:  Negative for difficulty urinating.  Musculoskeletal:  Positive for back pain and myalgias. Negative for arthralgias, gait problem, joint swelling, neck pain and neck stiffness.  Skin:  Negative for color change, pallor, rash and wound.  Neurological:  Negative for dizziness, tremors, seizures, syncope, facial asymmetry, speech difficulty, weakness, light-headedness, numbness and headaches.  Psychiatric/Behavioral:  Negative for confusion.    Physical Exam Updated Vital Signs BP 122/79 (BP Location: Left Arm)   Pulse 83   Temp 98.2 F (36.8 C) (Oral)   Resp 17    SpO2 95%   Physical Exam Vitals and nursing note reviewed.  Constitutional:      General: She is not in acute distress.    Appearance: She is not ill-appearing, toxic-appearing or diaphoretic.  HENT:     Head: Normocephalic.  Eyes:     General: No scleral icterus.       Right eye: No discharge.        Left eye: No discharge.  Cardiovascular:     Rate and Rhythm: Normal rate.     Heart sounds: Normal heart sounds.  Pulmonary:     Effort: Pulmonary effort is normal. No tachypnea, bradypnea or respiratory distress.     Breath sounds: Normal breath sounds. No stridor.  Musculoskeletal:     Cervical back: Normal.     Thoracic back: No swelling, edema, deformity, signs of trauma, lacerations, spasms, tenderness or bony tenderness.     Lumbar back: Tenderness and bony tenderness present. No swelling, edema, deformity, signs of trauma, lacerations or spasms. Negative right straight leg raise test and negative left straight leg raise test.     Right lower leg: Tenderness present. No swelling, deformity or lacerations. No edema.     Left lower leg: Tenderness present. No swelling, deformity or lacerations. No edema.     Comments: Patient has diffuse tenderness throughout lumbar back and bilateral lower extremities.  Skin:    General: Skin is warm and dry.  Neurological:     General: No focal deficit present.     Mental Status: She is alert and oriented to person, place, and time.     GCS: GCS eye subscore is 4. GCS verbal subscore is 5. GCS motor subscore is 6.  Psychiatric:        Behavior: Behavior is cooperative.    ED Results / Procedures / Treatments   Labs (all labs ordered are listed, but only abnormal results are displayed) Labs Reviewed  CBC WITH DIFFERENTIAL/PLATELET - Abnormal; Notable for the following components:      Result Value   WBC 15.7 (*)    RBC 2.94 (*)    Hemoglobin 8.5 (*)    HCT 25.3 (*)    RDW 20.1 (*)    Platelets 686 (*)    nRBC 0.9 (*)    Neutro Abs  11.8 (*)    Monocytes Absolute 1.6 (*)    Abs Immature Granulocytes 0.09 (*)    All other components within normal limits  RETICULOCYTES - Abnormal; Notable for the following components:   Retic Ct Pct 12.9 (*)  RBC. 2.94 (*)    Retic Count, Absolute 380.4 (*)    Immature Retic Fract 26.7 (*)    All other components within normal limits  BASIC METABOLIC PANEL - Abnormal; Notable for the following components:   Glucose, Bld 100 (*)    Creatinine, Ser <0.30 (*)    All other components within normal limits  I-STAT BETA HCG BLOOD, ED ([facility], [facility], [facility] ONLY)    EKG None  Radiology No results found.  Procedures Procedures   Medications Ordered in ED Medications  0.45 % sodium chloride infusion ( Intravenous New Bag/Given 02/17/21 1915)  HYDROmorphone (DILAUDID) injection 2 mg (2 mg Intravenous Given 02/17/21 1917)  HYDROmorphone (DILAUDID) injection 2 mg (2 mg Intravenous Given 02/17/21 1943)  diphenhydrAMINE (BENADRYL) injection 25 mg (25 mg Intravenous Given 02/17/21 2011)  oxyCODONE (Oxy IR/ROXICODONE) immediate release tablet 15 mg (15 mg Oral Given 02/17/21 2105)    ED Course  I have reviewed the triage vital signs and the nursing notes.  Pertinent labs & imaging results that were available during my care of the patient were reviewed by me and considered in my medical decision making (see chart for details).  Clinical Course as of 02/18/21 0222  Sat Feb 17, 2021  2208 Spoke to hospitalist who agreed to see the patient for admission. [PB]    Clinical Course User Index [PB] Dyann Ruddle   MDM Rules/Calculators/A&P                           Alert 40 year old female in no acute distress, nontoxic-appearing.  Presents with chief complaint of lower back pain and bilateral lower leg pain associated with sickle cell pain crisis.  Patient has no recent falls or traumatic injuries.  No red flag symptoms.  Patient has no numbness, weakness, saddle anesthesia, bowel or bladder  dysfunction.  Will obtain BMP, CBC, reticulocyte count, beta-hCG.  Will initiate normal saline infusion and IV Dilaudid.  Patient reports continued pain and minimal improvement after receiving 2 rounds of regular IV Dilaudid and 1 round of oral oxycodone.  Will consult hospitalist for admission for continued pain management.  Spoke to hospitalist Dr. Velia Meyer who agreed to see the patient for admission.    Final Clinical Impression(s) / ED Diagnoses Final diagnoses:  None    Rx / DC Orders ED Discharge Orders     None        Loni Beckwith, PA-C 02/18/21 [id number]    Tegeler, Gwenyth Allegra, MD 02/18/21 [phone]

## 2021-02-17 NOTE — ED Notes (Signed)
Patient requesting blood draw from port. 

## 2021-02-17 NOTE — ED Provider Notes (Signed)
Emergency Medicine Provider Triage Evaluation Note  [facility] , a 40 y.o. female  was evaluated in triage.  Pt complains of lumbar back pain and bilateral lower leg pain.  Patient reports that pain started on Wednesday and has been constant since then.  Pain became worse last night.  Patient relates that pain is related to sickle cell crisis.  Patient denies any recent falls or traumatic injuries.  Patient denies any numbness, weakness, saddle anesthesia, bowel or bladder dysfunction.  Patient reports that she last took her medications yesterday.  Review of Systems  Positive: Lumbar back pain, bilateral lower leg pain. Negative: Chest pain, shortness of breath, fevers, chills, numbness, weakness, saddle anesthesia, bowel or bladder dysfunction.  Physical Exam  BP 122/79 (BP Location: Left Arm)   Pulse 83   Temp 98.2 F (36.8 C) (Oral)   Resp 17   SpO2 95%  Gen:   Awake, no distress   Resp:  Normal effort, lungs clear to auscultation bilaterally MSK:   Moves extremities without difficulty, tenderness to bilateral lower extremities.  No swelling noted to bilateral lower extremities. Other:  Tenderness to bilateral lumbar back.  Medical Decision Making  Medically screening exam initiated at 4:22 PM.  Appropriate orders placed.  [facility] was informed that the remainder of the evaluation will be completed by another provider, this initial triage assessment does not replace that evaluation, and the importance of remaining in the ED until their evaluation is complete.     Loni Beckwith, PA-C 02/17/21 1623    Tegeler, Gwenyth Allegra, MD 02/17/21 [id number]

## 2021-02-17 NOTE — H&P (Signed)
History and Physical    PLEASE NOTE THAT DRAGON DICTATION SOFTWARE WAS USED IN THE CONSTRUCTION OF THIS NOTE.   [place] JN:[id number] DOB: 04-12-1981 DOA: 02/17/2021  PCP: Vevelyn Francois, NP Patient coming from: home   I have personally briefly reviewed patient's old medical records in [place]  Chief Complaint: Low back pain  HPI: Molly Gutierrez is a 40 y.o. 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   The patient reports 3 to 4 days of low back discomfort as well as bilateral lower extremity pain, starting on Wednesday, 02/14/2021, in the absence of any preceding trauma or mechanism of injury.  Describes the low back pain as sharp, and midline in nature, worsening with palpation over the area.  She denies any associated saddle anesthesia, urinary retention or stool incontinence.  Pain is nonpositional, the patient denies any change in the nature or distribution of her discomfort with flexion at the level of the hips.  Denies any numbness or paresthesias extending into the lower extremities.  Rather, she reports concomitant onset of sharp pain in the anterior aspect of the bilateral lower extremities In a distribution starting at the level of the bilateral knees and extending down the anterior aspect of the bilateral lower extremities terminating at the level of the bilateral ankles.  Denies any associated calf tenderness, new lower extremity erythema, or new onset edema.  She notes that she has had pain in this distribution of the low back and bilateral lower extremities at the time of previous sickle cell pain crisis.  Pain in the above distribution was initially intermittent starting  Wednesday, 02/14/2021, but has progressed in terms of intensity and frequency over the ensuing few days, it has now been constant over the course the last day, with the patient reporting poor pain control at home and spite of her as needed Dilaudid as an outpatient.  Denies any associated chest pain, although she reports that she has previously experienced chest discomfort at time of prior sickle cell pain crisis.  In terms of her current presentation, denies any associated shortness of breath, palpitations, diaphoresis, dizziness, presyncope, or syncope.  No recent cough or hemoptysis.  No recent trauma, travel, periods of prolonged admission [place] activity, recent surgical procedures, or known history of underlying malignancy.  She reports that she has been on chronic anticoagulation via Xarelto since 2019 in the context of a history of bilateral pulmonary emboli in 2009 followed by multiple right-sided pulmonary emboli in 2019.  She notes good compliance in taking her Xarelto, reports that her most recent dose occurred yesterday and that she is due for next dose this evening.  No recent calf tenderness.  Following onset of the above distribution of low back and bilateral lower extremity discomfort, the patient reports that she has been intermittently monitoring her temperature at home to evaluate for any suggestion of underlying infectious process.  She denies any recent subjective fever, chills, rigors, or generalized myalgias, and reports a temperature max over the last 3 days of 99.0.   Denies any recent headache, neck stiffness, rhinitis, rhinorrhea, sore throat, wheezing,, nausea, vomiting, abdominal pain, diarrhea, or rash. No recent known COVID-19 exposures. Denies dysuria, gross hematuria, or change in urinary urgency/frequency.    She  reports that her typical triggers leading to prior episodes of sickle cell pain crisis are most commonly stress and insufficient rest.  She notes both of these to  been present over the course of the last week in the setting of her children returning to school.   Per chart review, it appears that the patient's baseline hemoglobin range is 7-8.5, and the most recent prior hemoglobin was found to be 8.2 on 01/07/2021.   ED Course:  Vital signs in the ED were notable for the following: Temperature max 98.2, heart rate 75-96; blood pressure 106/76-122/79; respiratory rate 14-22, oxygen saturation 95 to 100% on room air.   Labs were notable for the following: BMP notable for the following: Sodium 141 compared to 23, creatinine 0.30.  CBC notable for cell count 15,700 compared to most recent prior value of 13,200 on 01/07/2021, hemoglobin 8.5, platelets 686.  Reticulocyte count percentage 12.9, immature reticulocyte fraction 26.7.  Screening COVID-19/influenza PCR performed in the ED this evening, with results currently pending.  While in the ED, the following were administered: Dilaudid 2 mg IV x3 doses, oxycodone 50 mg p.o. x1, and was started on half-normal saline at 75 cc/h.  In spite of the above doses of IV and p.o. analgesia, the patient reports continued significant discomfort, prompting admission to the hospital service for further evaluation management of sickle cell pain crisis, including for the purpose of improving pain control via aggressive IV analgesic intervention.     Review of Systems: As per HPI otherwise 10 point review of systems negative.   Past Medical History:  Diagnosis Date   Asthma    Eczema    History of pulmonary embolus (PE)    Sickle cell anemia ([place])     Past Surgical History:  Procedure Laterality Date   CHOLECYSTECTOMY     ERCP     JOINT REPLACEMENT     PORTA CATH INSERTION     TUBAL LIGATION     WISDOM TOOTH EXTRACTION      Social History:  reports that she has never smoked. She has never used smokeless tobacco. She reports that she does not drink alcohol and does not use drugs.   Allergies  Allergen Reactions    Cefaclor Hives and Swelling   Hydroxyurea Palpitations and Other (See Comments)    Lowers "blood levels" and heart rate (causes HYPOtension); "it messes me up, it drops my levels and stuff"    Omeprazole Anaphylaxis and Other (See Comments)    Causes "sharp pains in the stomach"   Ketamine Palpitations and Other (See Comments)    "Pt states she has had previous reaction to ketamine. States she becomes flushed, heart races, dizzy, and feels like she is going to pass out."    Family History  Problem Relation Age of Onset   Renal Disease Mother    Hypertension Mother    High Cholesterol Mother     Family history reviewed and not pertinent    Prior to Admission medications   Medication Sig Start Date End Date Taking? Authorizing Provider  albuterol (VENTOLIN HFA) 108 (90 Base) MCG/ACT inhaler Inhale 2 puffs into the lungs 2 (two) times daily as needed for wheezing or shortness of breath. 04/13/20 04/13/21 Yes Vevelyn Francois, NP  celecoxib (CELEBREX) 200 MG capsule Take 200 mg by mouth daily as needed for moderate pain or mild pain. 02/09/21  Yes [provider]  Cholecalciferol (VITAMIN D3) 25 MCG (1000 UT) CAPS Take 1 capsule (1,000 Units total)  by mouth daily. 04/13/20 04/13/21 Yes Vevelyn Francois, NP  Deferasirox (JADENU) 360 MG TABS Take 3 tablets (1,080 mg total) by mouth daily before breakfast. 04/13/20 04/13/21 Yes Vevelyn Francois, NP  diphenhydrAMINE (BENADRYL) 25 mg capsule Take 25 mg by mouth 3 (three) times daily as needed for itching.   Yes [provider]  folic acid (FOLVITE) 1 MG tablet Take 1 tablet (1 mg total) by mouth daily. 04/13/20 04/13/21 Yes King, Diona Foley, NP  HYDROmorphone (DILAUDID) 4 MG tablet Take 1 tablet (4 mg total) by mouth every 6 (six) hours as needed for up to 15 days for moderate pain or severe pain. 02/08/21 02/23/21 Yes Vevelyn Francois, NP  mirtazapine (REMERON) 45 MG tablet Take 1 tablet (45 mg total) by mouth at bedtime. 04/13/20  04/13/21 Yes King, Diona Foley, NP  mometasone-formoterol (DULERA) 100-5 MCG/ACT AERO Inhale 2 puffs into the lungs daily as needed for wheezing or shortness of breath. 04/13/20 04/13/21 Yes Vevelyn Francois, NP  omeprazole (PRILOSEC) 20 MG capsule Take 1 capsule (20 mg total) by mouth 2 (two) times daily before a meal. 08/02/20 08/02/21 Yes King, Diona Foley, NP  polyethylene glycol (MIRALAX / GLYCOLAX) 17 g packet Take 17 g by mouth daily as needed for mild constipation (MIX AND DRINK).   Yes [provider]  promethazine (PHENERGAN) 25 MG tablet Take 0.5-1 tablets (12.5-25 mg total) by mouth every 6 (six) hours as needed for nausea or vomiting. 10/25/20 10/25/21 Yes Vevelyn Francois, NP  vitamin B-12 (CYANOCOBALAMIN) 1000 MCG tablet Take 1 tablet (1,000 mcg total) by mouth daily. 04/13/20 04/13/21 Yes King, Diona Foley, NP  voxelotor (OXBRYTA) 500 MG TABS tablet Take 1,500 mg by mouth at bedtime.   Yes [provider]  XARELTO 20 MG TABS tablet Take 1 tablet (20 mg total) by mouth daily with supper. 10/25/20 10/25/21 Yes Vevelyn Francois, NP  gabapentin (NEURONTIN) 300 MG capsule Take 1 capsule (300 mg total) by mouth 3 (three) times daily. Patient not taking: Reported on 02/17/2021 04/13/20 04/13/21  Vevelyn Francois, NP     Objective    Physical Exam: Vitals:   02/17/21 2100 02/17/21 2130 02/17/21 2149 02/17/21 2200  BP: 105/73 106/76  118/83  Pulse: 81 79  75  Resp: '13 13  14  '$ Temp:      TempSrc:      SpO2: 94% 97%  100%  Weight:   56.7 kg   Height:   '5\' 3"'$  (1.6 m)     General: appears to be stated age; alert, oriented Skin: warm, dry, no rash Head:  AT/[place] Mouth:  Oral mucosa membranes appear moist, normal dentition Neck: supple; trachea midline Heart:  RRR; did not appreciate any M/R/G Lungs: CTAB, did not appreciate any wheezes, rales, or rhonchi Abdomen: + BS; soft, ND, NT Vascular: 2+ pedal pulses b/l; 2+ radial pulses b/l Extremities: no peripheral edema, no muscle  wasting Neuro: strength and sensation intact in upper and lower extremities b/l    Labs on Admission: I have personally reviewed following labs and imaging studies  CBC: Recent Labs  Lab 02/17/21 1857  WBC 15.7*  NEUTROABS 11.8*  HGB 8.5*  HCT 25.3*  MCV 86.1  PLT [id number]*   Basic Metabolic Panel: Recent Labs  Lab 02/17/21 1857  NA 141  K 3.5  CL 110  CO2 23  GLUCOSE 100*  BUN 9  CREATININE <0.30*  CALCIUM 9.6   GFR: CrCl cannot be calculated (This lab  value cannot be used to calculate CrCl because it is not a number: <0.30). Liver Function Tests: No results for input(s): AST, ALT, ALKPHOS, BILITOT, PROT, ALBUMIN in the last 168 hours. No results for input(s): LIPASE, AMYLASE in the last 168 hours. No results for input(s): AMMONIA in the last 168 hours. Coagulation Profile: No results for input(s): INR, PROTIME in the last 168 hours. Cardiac Enzymes: No results for input(s): CKTOTAL, CKMB, CKMBINDEX, TROPONINI in the last 168 hours. BNP (last 3 results) No results for input(s): PROBNP in the last 8760 hours. HbA1C: No results for input(s): HGBA1C in the last 72 hours. CBG: No results for input(s): GLUCAP in the last 168 hours. Lipid Profile: No results for input(s): CHOL, HDL, LDLCALC, TRIG, CHOLHDL, LDLDIRECT in the last 72 hours. Thyroid Function Tests: No results for input(s): TSH, T4TOTAL, FREET4, T3FREE, THYROIDAB in the last 72 hours. Anemia Panel: Recent Labs    02/17/21 1857  RETICCTPCT 12.9*   Urine analysis:    Component Value Date/Time   COLORURINE YELLOW 11/22/2020 0430   APPEARANCEUR HAZY (A) 11/22/2020 0430   LABSPEC 1.010 11/22/2020 0430   PHURINE 6.0 11/22/2020 0430   GLUCOSEU NEGATIVE 11/22/2020 0430   HGBUR SMALL (A) 11/22/2020 0430   BILIRUBINUR NEGATIVE 11/22/2020 0430   BILIRUBINUR neg 08/03/2020 1429   KETONESUR NEGATIVE 11/22/2020 0430   PROTEINUR NEGATIVE 11/22/2020 0430   UROBILINOGEN 1.0 08/03/2020 1429   NITRITE NEGATIVE  11/22/2020 0430   LEUKOCYTESUR NEGATIVE 11/22/2020 0430    Radiological Exams on Admission: No results found.     Assessment/Plan   Molly Gutierrez is a 40 y.o. 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    Principal Problem:   Sickle cell pain crisis ([place]) Active Problems:   Leukocytosis   Asthma   Hx of pulmonary embolism (HCC)   Gastro-esophageal reflux disease without esophagitis    #) Acute Sickle Cell Pain Crisis: In the setting of a history of known sickle cell disease, the patient presents with progressive lower back and bilateral lower extremity discomfort in a distribution of intensity and quality similar to that which she has experienced at times of her previous sickle cell pain crisis, with labs reflecting Hgb 8.5 and reticulocyte count percentage 12.9 and immature reticulocyte fraction 26.7.  Denies any associated chest pain, but will check EKG at this time to establish baseline given the patient's report that she has experienced an episode of acute chest syndrome at time of one of her previous sickle cell pain crisis.  Triggering factors appear to be life stressors at home as well as recent decline in sleep associated with the patient's children returning to school over the last week.  No evidence of associated/contributory underlying dehydration and no suspicion for ambient temperature contributions.  No evidence of underlying infectious process at this time, although will await results of COVID-19 PCR that was checked in the ED this evening, but with result currently pending, in addition to further expanding infectious evaluation, as further detailed below.  Overall, no evidence to  suggest acute chest syndrome at this time.  Patient noted to have significant residual discomfort in the ED following 3 doses of IV Dilaudid and 1 dose of oral oxycodone, prompting decision to admit to [facility] for aggressive  pain control, as further detailed below, with close monitoring for development of respiratory depression, and evaluation for any additional underlying organic contributing factors.  Following my discussions with the patient regarding pain control options that include prn IV Dilaudid versus Dilaudid PCA, the patient conveys preference for proceeding with Dilaudid PCA.  Her current weight is documented to be 56 kg, and will initiate Dilaudid PCA with bolus and lockout settings, as quantified below, and consistent with the following parameters: 0.01 - 0.02 mg/kg with 10 minute lockout interval and 1 hour limit of 0.06 mg/kg and this opioid resistant patient.      Plan: Repeat CBC in the morning.  Type, screen. Monitor on telemetry. Sickle cell pain assessment per protocol. Aggressive opioid analgesia in the form of Dilaudid PCA, as described above, with close monitoring for development of respiratory depression: 0.6 mg bolus dosing with 10-minute lockout and 1 hour limit of 3.5 mg. monitor continuous pulse oximetry. Prn supplemental O2 in order to maintain O2 sats between 90-92%, with care to not over-oxygenate with supplemental O2 as this can suppress bone marrow production of rbc's.  Prn K pad to the affected area. Gentle IVF's with 1/2NS and caution to not induce volume overload.  Monitor strict I's and O's.  Close monitoring of renal function, including repeat BMP in the morning. incentive spirometry to decrease risk of development of atelectasis.  Check EKG, chest x-ray, and urinalysis, as above.      #) Leukocytosis: Presenting with blood cell count of 15,700, which is suspected to be on the basis of reactive leukocytosis in the setting of presenting acute sickle cell pain crisis.   No evidence to suggest underlying infectious process at this time, but will pursue additional infectious evaluation, as described below.  Plan: Follow-up for results of COVID-19/influenza PCR checked in the ED this evening.  Check chest x-ray and urinalysis.  Repeat CBC with differential in the morning.  Gentle IV fluids with half-normal saline running overnight.      #) Mild intermittent asthma: Documented history of such: On as needed albuterol as an outpatient.  No evidence to suggest acute asthma exacerbation at this time.  Plan: As needed albuterol inhaler.  Add on serum magnesium level.  Monitoring continuous pulse oximetry as component of evaluation/treatment relating to presenting sickle cell pain crisis, as above.      #) History of pulmonary emboli: The patient is chronically anticoagulated on Xarelto following multiple prior pulmonary in 2009 in 2019, with the patient reporting good compliance on her home Xarelto.  She is due for her next scheduled dose this evening (02/17/21).   Plan: Continue home Xarelto, with next dose to occur now.  Repeat CBC in the morning.      #) GERD: Documented history of such, on omeprazole as an outpatient.  Plan: Continue home PPI.     DVT prophylaxis: Continue home Xarelto Code Status: Full code Family Communication: none Disposition Plan: Per Rounding Team Consults called: none;  Admission status: Inpatient; med telemetry     Of note, this patient was added by me to the following Admit List/Treatment Team: [email or web address].      PLEASE NOTE THAT DRAGON DICTATION SOFTWARE WAS USED IN THE CONSTRUCTION OF THIS NOTE.   [place] [facility] Pager [phone] From [place]  Otherwise, please contact night-coverage  [email or web address] Password [email or web address]   02/17/2021, 10:17 PM

## 2021-02-18 DIAGNOSIS — D5700 Hb-SS disease with crisis, unspecified: Secondary | ICD-10-CM | POA: Diagnosis not present

## 2021-02-18 LAB — CBC WITH DIFFERENTIAL/PLATELET
Abs Immature Granulocytes: 0.18 10*3/uL — ABNORMAL HIGH (ref 0.00–0.07)
Basophils Absolute: 0.1 10*3/uL (ref 0.0–0.1)
Basophils Relative: 0 %
Eosinophils Absolute: 0.3 10*3/uL (ref 0.0–0.5)
Eosinophils Relative: 1 %
HCT: 21 % — ABNORMAL LOW (ref 36.0–46.0)
Hemoglobin: 7 g/dL — ABNORMAL LOW (ref 12.0–15.0)
Immature Granulocytes: 1 %
Lymphocytes Relative: 14 %
Lymphs Abs: 3.2 10*3/uL (ref 0.7–4.0)
MCH: 28.9 pg (ref 26.0–34.0)
MCHC: 33.3 g/dL (ref 30.0–36.0)
MCV: 86.8 fL (ref 80.0–100.0)
Monocytes Absolute: 2.5 10*3/uL — ABNORMAL HIGH (ref 0.1–1.0)
Monocytes Relative: 12 %
Neutro Abs: 15.9 10*3/uL — ABNORMAL HIGH (ref 1.7–7.7)
Neutrophils Relative %: 72 %
Platelets: 473 10*3/uL — ABNORMAL HIGH (ref 150–400)
RBC: 2.42 MIL/uL — ABNORMAL LOW (ref 3.87–5.11)
RDW: 20 % — ABNORMAL HIGH (ref 11.5–15.5)
WBC: 22.1 10*3/uL — ABNORMAL HIGH (ref 4.0–10.5)
nRBC: 0.5 % — ABNORMAL HIGH (ref 0.0–0.2)

## 2021-02-18 LAB — COMPREHENSIVE METABOLIC PANEL
ALT: 15 U/L (ref 0–44)
AST: 26 U/L (ref 15–41)
Albumin: 4.3 g/dL (ref 3.5–5.0)
Alkaline Phosphatase: 48 U/L (ref 38–126)
Anion gap: 6 (ref 5–15)
BUN: 8 mg/dL (ref 6–20)
CO2: 25 mmol/L (ref 22–32)
Calcium: 9.2 mg/dL (ref 8.9–10.3)
Chloride: 107 mmol/L (ref 98–111)
Creatinine, Ser: 0.59 mg/dL (ref 0.44–1.00)
GFR, Estimated: >60 mL/min (ref >60)
Glucose, Bld: 86 mg/dL (ref 70–99)
Potassium: 3.4 mmol/L — ABNORMAL LOW (ref 3.5–5.1)
Sodium: 138 mmol/L (ref 135–145)
Total Bilirubin: 3 mg/dL — ABNORMAL HIGH (ref 0.3–1.2)
Total Protein: 7.7 g/dL (ref 6.5–8.1)

## 2021-02-18 LAB — TYPE AND SCREEN
ABO/RH(D): A POS
Antibody Screen: NEGATIVE

## 2021-02-18 LAB — MAGNESIUM: Magnesium: 2.2 mg/dL (ref 1.7–2.4)

## 2021-02-18 MED ORDER — CHLORHEXIDINE GLUCONATE CLOTH 2 % EX PADS
6.0000 | MEDICATED_PAD | Freq: Every day | CUTANEOUS | Status: DC
  Administered 2021-02-18 – 2021-02-20 (×3): 6 via TOPICAL

## 2021-02-18 MED ORDER — HYDROMORPHONE 1 MG/ML IV SOLN
INTRAVENOUS | Status: DC
  Administered 2021-02-18: 3.5 mg via INTRAVENOUS
  Administered 2021-02-18 – 2021-02-19 (×2): 3 mg via INTRAVENOUS
  Administered 2021-02-19: 5 mg via INTRAVENOUS
  Administered 2021-02-19: 30 mg via INTRAVENOUS
  Administered 2021-02-19: 2 mg via INTRAVENOUS
  Administered 2021-02-19: 5 mg via INTRAVENOUS
  Administered 2021-02-19: 1.5 mg via INTRAVENOUS
  Administered 2021-02-20: 2.5 mg via INTRAVENOUS
  Administered 2021-02-20: 2 mg via INTRAVENOUS
  Administered 2021-02-20: 3 mg via INTRAVENOUS
  Administered 2021-02-20: 6 mg via INTRAVENOUS
  Filled 2021-02-18: qty 30

## 2021-02-18 NOTE — ED Notes (Signed)
Called report to [place], Therapist, sports

## 2021-02-19 DIAGNOSIS — D5700 Hb-SS disease with crisis, unspecified: Secondary | ICD-10-CM | POA: Diagnosis not present

## 2021-02-19 LAB — CBC WITH DIFFERENTIAL/PLATELET
Abs Immature Granulocytes: 0.14 10*3/uL — ABNORMAL HIGH (ref 0.00–0.07)
Basophils Absolute: 0.1 10*3/uL (ref 0.0–0.1)
Basophils Relative: 0 %
Eosinophils Absolute: 0.8 10*3/uL — ABNORMAL HIGH (ref 0.0–0.5)
Eosinophils Relative: 5 %
HCT: 21.8 % — ABNORMAL LOW (ref 36.0–46.0)
Hemoglobin: 7.4 g/dL — ABNORMAL LOW (ref 12.0–15.0)
Immature Granulocytes: 1 %
Lymphocytes Relative: 24 %
Lymphs Abs: 4.1 10*3/uL — ABNORMAL HIGH (ref 0.7–4.0)
MCH: 29.1 pg (ref 26.0–34.0)
MCHC: 33.9 g/dL (ref 30.0–36.0)
MCV: 85.8 fL (ref 80.0–100.0)
Monocytes Absolute: 2.2 10*3/uL — ABNORMAL HIGH (ref 0.1–1.0)
Monocytes Relative: 13 %
Neutro Abs: 9.6 10*3/uL — ABNORMAL HIGH (ref 1.7–7.7)
Neutrophils Relative %: 57 %
Platelets: 515 10*3/uL — ABNORMAL HIGH (ref 150–400)
RBC: 2.54 MIL/uL — ABNORMAL LOW (ref 3.87–5.11)
RDW: 20.4 % — ABNORMAL HIGH (ref 11.5–15.5)
WBC: 16.8 10*3/uL — ABNORMAL HIGH (ref 4.0–10.5)
nRBC: 0.7 % — ABNORMAL HIGH (ref 0.0–0.2)

## 2021-02-19 MED ORDER — HYDROMORPHONE HCL 1 MG/ML IJ SOLN: 1.0000 mg | INTRAMUSCULAR | Status: DC | PRN

## 2021-02-19 MED ORDER — HYDROMORPHONE HCL 4 MG PO TABS: 4.0000 mg | ORAL_TABLET | Freq: Four times a day (QID) | ORAL | Status: DC | PRN

## 2021-02-19 NOTE — Progress Notes (Signed)
Subjective: Molly Gutierrez is a 40 year old female with a medical history significant for sickle cell anemia, recurrent sickle cell pain crisis, frequent hospitalization, history of PE on Xarelto, chronic pain syndrome, opiate dependence and tolerance, and history of anemia of chronic disease was admitted for sickle cell pain crisis.  Patient complains of pain primarily to low back and lower extremities.  She says that pain intensity has improved some overnight, however she is not ready for discharge.  She rates her pain as 6/10 and states that her goal is 3/10 in order to function at home. Patient denies headache, chest pain, shortness of breath, urinary symptoms, nausea, vomiting, or diarrhea.  Objective:  Vital signs in last 24 hours:  Vitals:   02/19/21 0430 02/19/21 0431 02/19/21 0508 02/19/21 0811  BP:   108/72   Pulse:   100   Resp: '14  14 16  '$ Temp:   97.7 F (36.5 C)   TempSrc:   Oral   SpO2: 94%  92% 95%  Weight:  56.7 kg    Height:        Intake/Output from previous day:   Intake/Output Summary (Last 24 hours) at 02/19/2021 0926 Last data filed at 02/19/2021 0047 Gross per 24 hour  Intake 2850.74 ml  Output --  Net 2850.74 ml    Physical Exam: General: Alert, awake, oriented x3, in no acute distress.  HEENT: [place]/AT PEERL, EOMI Neck: Trachea midline,  no masses, no thyromegal,y no JVD, no carotid bruit OROPHARYNX:  Moist, No exudate/ erythema/lesions.  Heart: Regular rate and rhythm, without murmurs, rubs, gallops, PMI non-displaced, no heaves or thrills on palpation.  Lungs: Clear to auscultation, no wheezing or rhonchi noted. No increased vocal fremitus resonant to percussion  Abdomen: Soft, nontender, nondistended, positive bowel sounds, no masses no hepatosplenomegaly noted..  Neuro: No focal neurological deficits noted cranial nerves II through XII grossly intact. DTRs 2+ bilaterally upper and lower extremities. Strength 5 out of 5 in bilateral upper and lower  extremities. Musculoskeletal: No warm swelling or erythema around joints, no spinal tenderness noted. Psychiatric: Patient alert and oriented x3, good insight and cognition, good recent to remote recall. Lymph node survey: No cervical axillary or inguinal lymphadenopathy noted.  Lab Results:  Basic Metabolic Panel:    Component Value Date/Time   NA 138 02/18/2021 0545   NA 137 08/02/2020 1538   K 3.4 (L) 02/18/2021 0545   CL 107 02/18/2021 0545   CO2 25 02/18/2021 0545   BUN 8 02/18/2021 0545   BUN 7 08/02/2020 1538   CREATININE 0.59 02/18/2021 0545   GLUCOSE 86 02/18/2021 0545   CALCIUM 9.2 02/18/2021 0545   CBC:    Component Value Date/Time   WBC 16.8 (H) 02/19/2021 0820   HGB 7.4 (L) 02/19/2021 0820   HGB 7.8 (L) 08/02/2020 1538   HCT 21.8 (L) 02/19/2021 0820   HCT 22.3 (L) 08/02/2020 1538   PLT 515 (H) 02/19/2021 0820   PLT 560 (H) 08/02/2020 1538   MCV 85.8 02/19/2021 0820   MCV 92 08/02/2020 1538   NEUTROABS 9.6 (H) 02/19/2021 0820   NEUTROABS 9.5 (H) 08/02/2020 1538   LYMPHSABS 4.1 (H) 02/19/2021 0820   LYMPHSABS 3.0 08/02/2020 1538   MONOABS 2.2 (H) 02/19/2021 0820   EOSABS 0.8 (H) 02/19/2021 0820   EOSABS 0.4 08/02/2020 1538   BASOSABS 0.1 02/19/2021 0820   BASOSABS 0.1 08/02/2020 1538    Recent Results (from the past 240 hour(s))  Resp Panel by RT-PCR (Flu A&B, Covid)  Nasopharyngeal Swab     Status: None   Collection Time: 02/17/21 10:49 PM   Specimen: Nasopharyngeal Swab; Nasopharyngeal(NP) swabs in vial transport medium  Result Value Ref Range Status   SARS Coronavirus 2 by RT PCR NEGATIVE NEGATIVE Final    Comment: (NOTE) SARS-CoV-2 target nucleic acids are NOT DETECTED.  The SARS-CoV-2 RNA is generally detectable in upper respiratory specimens during the acute phase of infection. The lowest concentration of SARS-CoV-2 viral copies this assay can detect is 138 copies/mL. A negative result does not preclude SARS-Cov-2 infection and should not be  used as the sole basis for treatment or other patient management decisions. A negative result may occur with  improper specimen collection/handling, submission of specimen other than nasopharyngeal swab, presence of viral mutation(s) within the areas targeted by this assay, and inadequate number of viral copies(<138 copies/mL). A negative result must be combined with clinical observations, patient history, and epidemiological information. The expected result is Negative.  Fact Sheet for Patients:  EntrepreneurPulse.com.au  Fact Sheet for Healthcare Providers:  IncredibleEmployment.be  This test is no t yet approved or cleared by the [place] FDA and  has been authorized for detection and/or diagnosis of SARS-CoV-2 by FDA under an Emergency Use Authorization (EUA). This EUA will remain  in effect (meaning this test can be used) for the duration of the COVID-19 declaration under Section 564(b)(1) of the Act, 21 U.S.C.section 360bbb-3(b)(1), unless the authorization is terminated  or revoked sooner.       Influenza A by PCR NEGATIVE NEGATIVE Final   Influenza B by PCR NEGATIVE NEGATIVE Final    Comment: (NOTE) The Xpert Xpress SARS-CoV-2/FLU/RSV plus assay is intended as an aid in the diagnosis of influenza from Nasopharyngeal swab specimens and should not be used as a sole basis for treatment. Nasal washings and aspirates are unacceptable for Xpert Xpress SARS-CoV-2/FLU/RSV testing.  Fact Sheet for Patients: EntrepreneurPulse.com.au  Fact Sheet for Healthcare Providers: IncredibleEmployment.be  This test is not yet approved or cleared by the [place] FDA and has been authorized for detection and/or diagnosis of SARS-CoV-2 by FDA under an Emergency Use Authorization (EUA). This EUA will remain in effect (meaning this test can be used) for the duration of the COVID-19 declaration under Section  564(b)(1) of the Act, 21 U.S.C. section 360bbb-3(b)(1), unless the authorization is terminated or revoked.  Performed at [facility], [place] [address], [place], [place] [address]     Studies/Results: DG Chest Port 1 View  Result Date: 02/17/2021 CLINICAL DATA:  Center cell crisis EXAM: PORTABLE CHEST 1 VIEW COMPARISON:  01/03/2021 FINDINGS: Bilateral Port-A-Cath remain in place, unchanged. Cardiac, vascular congestion. No confluent opacities, effusions or edema. No acute bony abnormality. IMPRESSION: Cardiomegaly, vascular congestion. Electronically Signed   By: Rolm Baptise M.D.   On: 02/17/2021 23:23    Medications: Scheduled Meds:  Chlorhexidine Gluconate Cloth  6 each Topical Daily   HYDROmorphone   Intravenous Q4H   rivaroxaban  20 mg Oral Q supper   senna-docusate  1 tablet Oral BID   voxelotor  1,500 mg Oral QHS   Continuous Infusions:  sodium chloride 10 mL/hr at 02/19/21 [id number]   diphenhydrAMINE 25 mg (02/19/21 0429)   PRN Meds:.acetaminophen **OR** acetaminophen, albuterol, [DISCONTINUED] diphenhydrAMINE **OR** diphenhydrAMINE, HYDROmorphone (DILAUDID) injection, HYDROmorphone, mirtazapine, naloxone **AND** sodium chloride flush, ondansetron (ZOFRAN) IV, polyethylene glycol  Consultants: None  Procedures: None  Antibiotics: None  Assessment/Plan: Principal Problem:   Sickle cell pain crisis ([place]) Active Problems:   Leukocytosis  Asthma   Hx of pulmonary embolism (H[facility])   Gastro-esophageal reflux disease without esophagitis  Sickle cell disease with pain crisis: Pain intensity improving.  Discontinue IV Dilaudid PCA.  Restart patient's home medications Dilaudid 4 mg by mouth every 6 hours as needed for severe breakthrough pain Dilaudid 1 mg IV every 4 hours as needed Toradol 15 mg IV every 6 hours Monitor vital signs very closely, reevaluate pain scale regularly, and supplemental oxygen as needed  Leukocytosis: Stable.  Patient is afebrile  without any signs of acute infection or underlying infectious process.  Continue to follow closely.  Anemia of chronic disease: Today, patient's hemoglobin is 7.4, which is improved from yesterday.  History of pulmonary embolism: Continue Xarelto  History of mild intermittent asthma: Stable.  Continue home medications as needed Code Status: Full Code Family Communication: N/A Disposition Plan: Not yet ready for discharge  [place], MSN, FNP-C Patient [place], [place] [address] [phone]  If 7PM-7AM, please contact night-coverage.  02/19/2021, 9:26 AM  LOS: 2 days

## 2021-02-20 DIAGNOSIS — D5700 Hb-SS disease with crisis, unspecified: Principal | ICD-10-CM

## 2021-02-20 LAB — CBC
HCT: 21.7 % — ABNORMAL LOW (ref 36.0–46.0)
Hemoglobin: 7.4 g/dL — ABNORMAL LOW (ref 12.0–15.0)
MCH: 29.2 pg (ref 26.0–34.0)
MCHC: 34.1 g/dL (ref 30.0–36.0)
MCV: 85.8 fL (ref 80.0–100.0)
Platelets: 482 10*3/uL — ABNORMAL HIGH (ref 150–400)
RBC: 2.53 MIL/uL — ABNORMAL LOW (ref 3.87–5.11)
RDW: 20.9 % — ABNORMAL HIGH (ref 11.5–15.5)
WBC: 17.4 10*3/uL — ABNORMAL HIGH (ref 4.0–10.5)
nRBC: 0.7 % — ABNORMAL HIGH (ref 0.0–0.2)

## 2021-02-20 MED ORDER — HEPARIN SOD (PORK) LOCK FLUSH 100 UNIT/ML IV SOLN
500.0000 [IU] | INTRAVENOUS | Status: AC | PRN
  Administered 2021-02-20: 500 [IU]
  Filled 2021-02-20: qty 5

## 2021-02-20 MED ORDER — SODIUM CHLORIDE 0.9% FLUSH: 10.0000 mL | INTRAVENOUS | Status: DC | PRN

## 2021-02-21 ENCOUNTER — Other Ambulatory Visit: Payer: Self-pay | Admitting: Nurse Practitioner

## 2021-02-21 ENCOUNTER — Telehealth: Payer: Self-pay

## 2021-02-21 MED ORDER — HYDROMORPHONE HCL 4 MG PO TABS: 4.0000 mg | ORAL_TABLET | Freq: Four times a day (QID) | ORAL | 0 refills | Status: DC | PRN

## 2021-02-21 NOTE — Discharge Summary (Addendum)
Physician Discharge Summary  [facility] DQ:[id number] DOB: [birth year] DOA: 02/17/2021  PCP: Vevelyn Francois, NP  Admit date: 02/17/2021  Discharge date: 02/20/2021  Discharge Diagnoses:  Principal Problem:   Sickle cell pain crisis ([place]) Active Problems:   Leukocytosis   Asthma   Hx of pulmonary embolism ([place])   Gastro-esophageal reflux disease without esophagitis   Discharge Condition: Stable  Disposition:   Follow-up Information     Vevelyn Francois, NP Follow up in 2 day(s).   Specialty: Adult Health Nurse Practitioner Why: cbcd/cmp Contact information: [address] [place] [address] [phone]                Pt is discharged home in good condition and is to follow up with Vevelyn Francois, NP this week to have labs evaluated. [place]na [place] is instructed to increase activity slowly and balance with rest for the next few days, and use prescribed medication to complete treatment of pain  Diet: Regular Wt Readings from Last 3 Encounters:  02/20/21 56.9 kg  01/03/21 56.7 kg  12/08/20 56.9 kg    History of present illness:    Hospital Course:  Sickle cell disease with pain crisis: Patient was admitted for sickle cell pain crisis and managed appropriately with IVF, IV Dilaudid via PCA and IV Toradol, as well as other adjunct therapies per sickle cell pain management protocols.  IV Dilaudid PCA weaned appropriately.  Patient transition to home medications. Patient advised to resume all home medications.  Pain medications are managed by her PCP, she has a follow-up appointment scheduled.  Anemia of chronic disease: Prior to discharge, hemoglobin 7.4 g/dL which is consistent with his baseline.  Patient will follow-up with PCP over the next several weeks, at that time repeat CBC with differential and CMP.  History of PE: Patient advised to continue Xarelto as prescribed.  Patient was therefore discharged home today in a hemodynamically  stable condition.   Abrielle will follow-up with PCP within 1 week of this discharge. Analucia was counseled extensively about nonpharmacologic means of pain management, patient verbalized understanding and was appreciative of  the care received during this admission.   We discussed the need for good hydration, monitoring of hydration status, avoidance of heat, cold, stress, and infection triggers. We discussed the need to be adherent with taking home medications. Patient was reminded of the need to seek medical attention immediately if any symptom of bleeding, anemia, or infection occurs.  Discharge Exam: Vitals:   02/20/21 1200 02/20/21 1351  BP:  104/71  Pulse:  88  Resp: 16 14  Temp:  98.6 F (37 C)  SpO2: 95% 92%   Vitals:   02/20/21 0800 02/20/21 1011 02/20/21 1200 02/20/21 1351  BP:  108/77  104/71  Pulse:  93  88  Resp: '16 14 16 14  '$ Temp:  99.5 F (37.5 C)  98.6 F (37 C)  TempSrc:  Oral  Oral  SpO2: 95% 96% 95% 92%  Weight:      Height:        General appearance : Awake, alert, not in any distress. Speech Clear. Not toxic looking HEENT: Atraumatic and Normocephalic, pupils equally reactive to light and accomodation Neck: Supple, no JVD. No cervical lymphadenopathy.  Chest: Good air entry bilaterally, no added sounds  CVS: S1 S2 regular, no murmurs.  Abdomen: Bowel sounds present, Non tender and not distended with no guarding, rigidity or rebound. Extremities: B/L Lower Ext shows no edema, both legs are  warm to touch Neurology: Awake alert, and oriented X 3, CN II-XII intact, Non focal Skin: No Rash  Discharge Instructions  Discharge Instructions     Discharge patient   Complete by: As directed    Discharge disposition: 01-Home or Self Care   Discharge patient date: 02/20/2021      Allergies as of 02/20/2021       Reactions   Cefaclor Hives, Swelling   Hydroxyurea Palpitations, Other (See Comments)   Lowers "blood levels" and heart rate (causes  HYPOtension); "it messes me up, it drops my levels and stuff"   Omeprazole Anaphylaxis, Other (See Comments)   Causes "sharp pains in the stomach"   Ketamine Palpitations, Other (See Comments)   "Pt states she has had previous reaction to ketamine. States she becomes flushed, heart races, dizzy, and feels like she is going to pass out."        Medication List     TAKE these medications    albuterol 108 (90 Base) MCG/ACT inhaler Commonly known as: VENTOLIN HFA Inhale 2 puffs into the lungs 2 (two) times daily as needed for wheezing or shortness of breath.   celecoxib 200 MG capsule Commonly known as: CELEBREX Take 200 mg by mouth daily as needed for moderate pain or mild pain.   Deferasirox 360 MG Tabs Commonly known as: Jadenu Take 3 tablets (1,080 mg total) by mouth daily before breakfast.   diphenhydrAMINE 25 mg capsule Commonly known as: BENADRYL Take 25 mg by mouth 3 (three) times daily as needed for itching.   folic acid 1 MG tablet Commonly known as: FOLVITE Take 1 tablet (1 mg total) by mouth daily.   gabapentin 300 MG capsule Commonly known as: NEURONTIN Take 1 capsule (300 mg total) by mouth 3 (three) times daily.   mirtazapine 45 MG tablet Commonly known as: REMERON Take 1 tablet (45 mg total) by mouth at bedtime.   mometasone-formoterol 100-5 MCG/ACT Aero Commonly known as: DULERA Inhale 2 puffs into the lungs daily as needed for wheezing or shortness of breath.   omeprazole 20 MG capsule Commonly known as: PRILOSEC Take 1 capsule (20 mg total) by mouth 2 (two) times daily before a meal.   Oxbryta 500 MG Tabs tablet Generic drug: voxelotor Take 1,500 mg by mouth at bedtime.   polyethylene glycol 17 g packet Commonly known as: MIRALAX / GLYCOLAX Take 17 g by mouth daily as needed for mild constipation (MIX AND DRINK).   promethazine 25 MG tablet Commonly known as: PHENERGAN Take 0.5-1 tablets (12.5-25 mg total) by mouth every 6 (six) hours as  needed for nausea or vomiting.   vitamin B-12 1000 MCG tablet Commonly known as: CYANOCOBALAMIN Take 1 tablet (1,000 mcg total) by mouth daily.   Vitamin D3 25 MCG (1000 UT) Caps Take 1 capsule (1,000 Units total) by mouth daily.   Xarelto 20 MG Tabs tablet Generic drug: rivaroxaban Take 1 tablet (20 mg total) by mouth daily with supper.        The results of significant diagnostics from this hospitalization (including imaging, microbiology, ancillary and laboratory) are listed below for reference.    Significant Diagnostic Studies: DG Chest Port 1 View  Result Date: 02/17/2021 CLINICAL DATA:  Center cell crisis EXAM: PORTABLE CHEST 1 VIEW COMPARISON:  01/03/2021 FINDINGS: Bilateral Port-A-Cath remain in place, unchanged. Cardiac, vascular congestion. No confluent opacities, effusions or edema. No acute bony abnormality. IMPRESSION: Cardiomegaly, vascular congestion. Electronically Signed   By: Rolm Baptise M.D.   On:  02/17/2021 23:23    Microbiology: Recent Results (from the past 240 hour(s))  Resp Panel by RT-PCR (Flu A&B, Covid) Nasopharyngeal Swab     Status: None   Collection Time: 02/17/21 10:49 PM   Specimen: Nasopharyngeal Swab; Nasopharyngeal(NP) swabs in vial transport medium  Result Value Ref Range Status   SARS Coronavirus 2 by RT PCR NEGATIVE NEGATIVE Final    Comment: (NOTE) SARS-CoV-2 target nucleic acids are NOT DETECTED.  The SARS-CoV-2 RNA is generally detectable in upper respiratory specimens during the acute phase of infection. The lowest concentration of SARS-CoV-2 viral copies this assay can detect is 138 copies/mL. A negative result does not preclude SARS-Cov-2 infection and should not be used as the sole basis for treatment or other patient management decisions. A negative result may occur with  improper specimen collection/handling, submission of specimen other than nasopharyngeal swab, presence of viral mutation(s) within the areas targeted by this  assay, and inadequate number of viral copies(<138 copies/mL). A negative result must be combined with clinical observations, patient history, and epidemiological information. The expected result is Negative.  Fact Sheet for Patients:  EntrepreneurPulse.com.au  Fact Sheet for Healthcare Providers:  IncredibleEmployment.be  This test is no t yet approved or cleared by the [place] FDA and  has been authorized for detection and/or diagnosis of SARS-CoV-2 by FDA under an Emergency Use Authorization (EUA). This EUA will remain  in effect (meaning this test can be used) for the duration of the COVID-19 declaration under Section 564(b)(1) of the Act, 21 U.S.C.section 360bbb-3(b)(1), unless the authorization is terminated  or revoked sooner.       Influenza A by PCR NEGATIVE NEGATIVE Final   Influenza B by PCR NEGATIVE NEGATIVE Final    Comment: (NOTE) The Xpert Xpress SARS-CoV-2/FLU/RSV plus assay is intended as an aid in the diagnosis of influenza from Nasopharyngeal swab specimens and should not be used as a sole basis for treatment. Nasal washings and aspirates are unacceptable for Xpert Xpress SARS-CoV-2/FLU/RSV testing.  Fact Sheet for Patients: EntrepreneurPulse.com.au  Fact Sheet for Healthcare Providers: IncredibleEmployment.be  This test is not yet approved or cleared by the [place] FDA and has been authorized for detection and/or diagnosis of SARS-CoV-2 by FDA under an Emergency Use Authorization (EUA). This EUA will remain in effect (meaning this test can be used) for the duration of the COVID-19 declaration under Section 564(b)(1) of the Act, 21 U.S.C. section 360bbb-3(b)(1), unless the authorization is terminated or revoked.  Performed at [facility], [place] [address] [place], [place] [address]      Labs: Basic Metabolic Panel: Recent Labs  Lab 02/17/21 1857  02/17/21 1957 02/18/21 0545  NA 141  --  138  K 3.5  --  3.4*  CL 110  --  107  CO2 23  --  25  GLUCOSE 100*  --  86  BUN 9  --  8  CREATININE <0.30*  --  0.59  CALCIUM 9.6  --  9.2  MG  --  2.5* 2.2   Liver Function Tests: Recent Labs  Lab 02/18/21 0545  AST 26  ALT 15  ALKPHOS 48  BILITOT 3.0*  PROT 7.7  ALBUMIN 4.3   No results for input(s): LIPASE, AMYLASE in the last 168 hours. No results for input(s): AMMONIA in the last 168 hours. CBC: Recent Labs  Lab 02/17/21 1857 02/18/21 0545 02/19/21 0820 02/20/21 0537  WBC 15.7* 22.1* 16.8* 17.4*  NEUTROABS 11.8* 15.9* 9.6*  --  HGB 8.5* 7.0* 7.4* 7.4*  HCT 25.3* 21.0* 21.8* 21.7*  MCV 86.1 86.8 85.8 85.8  PLT 686* 473* 515* 482*   Cardiac Enzymes: No results for input(s): CKTOTAL, CKMB, CKMBINDEX, TROPONINI in the last 168 hours. BNP: Invalid input(s): POCBNP CBG: No results for input(s): GLUCAP in the last 168 hours.  Time coordinating discharge: 50 minutes  [place], MSN, FNP-C Patient [facility] [address] [place], [place] [address] [phone]  [facility] 02/21/2021, 5:29 PM

## 2021-02-21 NOTE — Telephone Encounter (Signed)
Hydromorphone  

## 2021-02-23 ENCOUNTER — Telehealth: Payer: Self-pay

## 2021-02-23 NOTE — Telephone Encounter (Signed)
Transition Care Management Unsuccessful Follow-up Telephone Call  Date of discharge and from where:  9/6/2022[facility]  Attempts:  1st Attempt  Reason for unsuccessful TCM follow-up call:  No answer/busy   Tomasa Rand, RN, BSN, CEN [place] Coordinator [phone]

## 2021-02-26 ENCOUNTER — Telehealth: Payer: Self-pay | Admitting: *Deleted

## 2021-02-26 NOTE — Telephone Encounter (Signed)
Transition Care Management Follow-up Telephone Call Date of discharge and from where: 02/20/21  [facility] How have you been since you were released from the hospital? Doing well Any questions or concerns? No  Items Reviewed: Did the pt receive and understand the discharge instructions provided? Yes  Medications obtained and verified? Yes  Other? N/a Any new allergies since your discharge? No Dietary orders reviewed? Yes Do you have support at home? Yes   Home Care and Equipment/Supplies: Were home health services ordered? No If so, what is the name of the agency? Has the agency set up a time to come to the patient's home? not applicable Were any new equipment or medical supplies ordered?  N/a What is the name of the medical supply agency? N/a Were you able to get the supplies/equipment? N/a Do you have any questions related to the use of the equipment or supplies? N/a  Functional Questionnaire: (I = Independent and D = Dependent) ADLs: I  Bathing/Dressing- I  Meal Prep-I  Eating- I  Maintaining continence- I  Transferring/Ambulation- I  Managing Meds- I  Follow up appointments reviewed:  PCP Hospital f/u appt confirmed? Yes  Scheduled to see Dionisio David on 03/26/21@ 120 pm [facility] f/u appt confirmed? No Are transportation arrangements needed? Yes  If their condition worsens, is the pt aware to call PCP or go to the Emergency Dept.? Yes Was the patient provided with contact information for the PCP's office or ED? Yes Was to pt encouraged to call back with questions or concerns? Yes

## 2021-03-05 ENCOUNTER — Telehealth: Payer: Self-pay

## 2021-03-05 NOTE — Telephone Encounter (Signed)
Hydromorphone 4 mg [facility] in [place]

## 2021-03-06 ENCOUNTER — Telehealth: Payer: Self-pay

## 2021-03-06 NOTE — Telephone Encounter (Signed)
Patient is due for Annual Wellness Exam. I attempted to reach patient, no answer or voicemail to leave a message.

## 2021-03-07 ENCOUNTER — Other Ambulatory Visit: Payer: Self-pay | Admitting: Nurse Practitioner

## 2021-03-07 MED ORDER — HYDROMORPHONE HCL 4 MG PO TABS: 4.0000 mg | ORAL_TABLET | Freq: Four times a day (QID) | ORAL | 0 refills | Status: DC | PRN

## 2021-03-09 ENCOUNTER — Telehealth: Payer: Self-pay

## 2021-03-09 NOTE — Telephone Encounter (Signed)
Patient called to schedule medicare wellness visit, no answer, mailbox is full.

## 2021-03-12 ENCOUNTER — Ambulatory Visit (INDEPENDENT_AMBULATORY_CARE_PROVIDER_SITE_OTHER): Payer: Medicare Other

## 2021-03-12 ENCOUNTER — Other Ambulatory Visit: Payer: Self-pay

## 2021-03-12 DIAGNOSIS — Z0000 Encounter for general adult medical examination without abnormal findings: Secondary | ICD-10-CM | POA: Diagnosis not present

## 2021-03-12 NOTE — Progress Notes (Signed)
Subjective:   Molly Gutierrez is a 40 y.o. female who presents for an Initial Medicare Annual Wellness Visit.  I connected with  [facility] on 03/12/21 by audio enabled telemedicine application and verified that I am speaking with the correct person using two identifiers.   I discussed the limitations of evaluation and management by telemedicine. The patient expressed understanding and agreed to proceed.   Location of Patient: Home Location of Provider: Office  List any persons and their roles That are participating in the visit with the patient. Lavell Luster, LPN   Review of Systems    Defer to PCP       Objective:    There were no vitals filed for this visit. There is no height or weight on file to calculate BMI.  Advanced Directives 02/18/2021 01/04/2021 01/03/2021 12/08/2020 12/08/2020 12/08/2020 11/21/2020  Does Patient Have a Medical Advance Directive? No No No - No No No  Would patient like information on creating a medical advance directive? No - Patient declined No - Patient declined - No - Guardian declined - - No - Patient declined  Some encounter information is confidential and restricted. Go to Review Flowsheets activity to see all data.    Current Medications (verified) Outpatient Encounter Medications as of 03/12/2021  Medication Sig   albuterol (VENTOLIN HFA) 108 (90 Base) MCG/ACT inhaler Inhale 2 puffs into the lungs 2 (two) times daily as needed for wheezing or shortness of breath.   celecoxib (CELEBREX) 200 MG capsule Take 200 mg by mouth daily as needed for moderate pain or mild pain.   Cholecalciferol (VITAMIN D3) 25 MCG (1000 UT) CAPS Take 1 capsule (1,000 Units total) by mouth daily.   Deferasirox (JADENU) 360 MG TABS Take 3 tablets (1,080 mg total) by mouth daily before breakfast.   diphenhydrAMINE (BENADRYL) 25 mg capsule Take 25 mg by mouth 3 (three) times daily as needed for itching.   folic acid (FOLVITE) 1 MG tablet Take 1 tablet (1 mg  total) by mouth daily.   gabapentin (NEURONTIN) 300 MG capsule Take 1 capsule (300 mg total) by mouth 3 (three) times daily. (Patient not taking: Reported on 02/17/2021)   HYDROmorphone (DILAUDID) 4 MG tablet Take 1 tablet (4 mg total) by mouth every 6 (six) hours as needed for up to 15 days for moderate pain or severe pain.   mirtazapine (REMERON) 45 MG tablet Take 1 tablet (45 mg total) by mouth at bedtime.   mometasone-formoterol (DULERA) 100-5 MCG/ACT AERO Inhale 2 puffs into the lungs daily as needed for wheezing or shortness of breath.   omeprazole (PRILOSEC) 20 MG capsule Take 1 capsule (20 mg total) by mouth 2 (two) times daily before a meal.   polyethylene glycol (MIRALAX / GLYCOLAX) 17 g packet Take 17 g by mouth daily as needed for mild constipation (MIX AND DRINK).   promethazine (PHENERGAN) 25 MG tablet Take 0.5-1 tablets (12.5-25 mg total) by mouth every 6 (six) hours as needed for nausea or vomiting.   vitamin B-12 (CYANOCOBALAMIN) 1000 MCG tablet Take 1 tablet (1,000 mcg total) by mouth daily.   voxelotor (OXBRYTA) 500 MG TABS tablet Take 1,500 mg by mouth at bedtime.   XARELTO 20 MG TABS tablet Take 1 tablet (20 mg total) by mouth daily with supper.   No facility-administered encounter medications on file as of 03/12/2021.    Allergies (verified) Cefaclor, Hydroxyurea, Omeprazole, and Ketamine   History: Past Medical History:  Diagnosis Date   Asthma  Eczema    History of pulmonary embolus (PE)    Sickle cell anemia (HCC)    Past Surgical History:  Procedure Laterality Date   CHOLECYSTECTOMY     ERCP     JOINT REPLACEMENT     PORTA CATH INSERTION     TUBAL LIGATION     WISDOM TOOTH EXTRACTION     Family History  Problem Relation Age of Onset   Renal Disease Mother    Hypertension Mother    High Cholesterol Mother    Social History   Socioeconomic History   Marital status: Single    Spouse name: Not on file   Number of children: Not on file   Years of  education: Not on file   Highest education level: Not on file  Occupational History   Not on file  Tobacco Use   Smoking status: Never   Smokeless tobacco: Never  Vaping Use   Vaping Use: Never used  Substance and Sexual Activity   Alcohol use: Never   Drug use: Never   Sexual activity: Not on file  Other Topics Concern   Not on file  Social History Narrative   Not on file   Social Determinants of Health   Financial Resource Strain: Not on file  Food Insecurity: Not on file  Transportation Needs: Not on file  Physical Activity: Not on file  Stress: Not on file  Social Connections: Not on file    Tobacco Counseling Counseling given: Not Answered   Clinical Intake:                 Diabetic?No         Activities of Daily Living In your present state of health, do you have any difficulty performing the following activities: 02/18/2021 01/04/2021  Hearing? N N  Vision? N N  Comment - -  Difficulty concentrating or making decisions? N N  Walking or climbing stairs? N Y  Dressing or bathing? N N  Doing errands, shopping? N N  Some encounter information is confidential and restricted. Go to Review Flowsheets activity to see all data.  Some recent data might be hidden    Patient Care Team: Vevelyn Francois, NP as PCP - General (Adult Health Nurse Practitioner)  Indicate any recent Medical Services you may have received from other than [facility] providers in the past year (date may be approximate).     Assessment:   This is a routine wellness examination for Molly Gutierrez.  Hearing/Vision screen No results found.  Dietary issues and exercise activities discussed:     Goals Addressed   None   Depression Screen PHQ 2/9 Scores 05/24/2020 04/13/2020 07/09/2019  PHQ - 2 Score 0 0 0    Fall Risk Fall Risk  05/24/2020 04/13/2020 07/09/2019  Falls in the past year? 1 1 0  Number falls in past yr: 0 0 -  Injury with Fall? 1 1 -    FALL RISK PREVENTION  PERTAINING TO THE HOME:  Any stairs in or around the home? Yes  If so, are there any without handrails? No  Home free of loose throw rugs in walkways, pet beds, electrical cords, etc? Yes  Adequate lighting in your home to reduce risk of falls? Yes   ASSISTIVE DEVICES UTILIZED TO PREVENT FALLS:  Life alert? No  Use of a cane, walker or w/c? No  Grab bars in the bathroom? No  Shower chair or bench in shower? No  Elevated toilet seat or a  handicapped toilet? No   TIMED UP AND GO:  Was the test performed?  N/A .  Length of time to ambulate 10 feet: N/A sec.     Cognitive Function:        Immunizations Immunization History  Administered Date(s) Administered   PFIZER Comirnaty(Gray Top)Covid-19 Tri-Sucrose Vaccine 09/25/2019, 10/16/2019   Tdap 07/09/2019    TDAP status: Up to date  Flu Vaccine status: Due, Education has been provided regarding the importance of this vaccine. Advised may receive this vaccine at local pharmacy or Health Dept. Aware to provide a copy of the vaccination record if obtained from local pharmacy or Health Dept. Verbalized acceptance and understanding.  Pneumococcal vaccine status: Up to date  Covid-19 vaccine status: Information provided on how to obtain vaccines.   Qualifies for Shingles Vaccine? No   Zostavax completed No   Shingrix Completed?: No.    Education has been provided regarding the importance of this vaccine. Patient has been advised to call insurance company to determine out of pocket expense if they have not yet received this vaccine. Advised may also receive vaccine at local pharmacy or Health Dept. Verbalized acceptance and understanding.  Screening Tests Health Maintenance  Topic Date Due   COVID-19 Vaccine (4 - Booster for Pfizer series) 09/18/2020   INFLUENZA VACCINE  01/15/2021   Hepatitis C Screening  04/17/2021 (Originally 06/03/1999)   PAP SMEAR-Modifier  06/17/2021   TETANUS/TDAP  07/08/2029   HIV Screening  Completed    HPV VACCINES  Aged Out    Health Maintenance  Health Maintenance Due  Topic Date Due   COVID-19 Vaccine (4 - Booster for [place] series) 09/18/2020   INFLUENZA VACCINE  01/15/2021      Mammogram status: Ordered 07/09/2019. Pt provided with contact info and advised to call to schedule appt.     Lung Cancer Screening: (Low Dose CT Chest recommended if Age 43-80 years, 30 pack-year currently smoking OR have quit w/in 15years.) does not qualify.   Lung Cancer Screening Referral: N/A  Additional Screening:  Hepatitis C Screening: does not qualify; Completed 12/16/2017  Vision Screening: Recommended annual ophthalmology exams for early detection of glaucoma and other disorders of the eye. Is the patient up to date with their annual eye exam?  No  Who is the provider or what is the name of the office in which the patient attends annual eye exams? [facility] If pt is not established with a provider, would they like to be referred to a provider to establish care? Yes .   Dental Screening: Recommended annual dental exams for proper oral hygiene  Community Resource Referral / Chronic Care Management: CRR required this visit?  No   CCM required this visit?  No      Plan:     I have personally reviewed and noted the following in the patient's chart:   Medical and social history Use of alcohol, tobacco or illicit drugs  Current medications and supplements including opioid prescriptions. Patient is currently taking opioid prescriptions. Information provided to patient regarding non-opioid alternatives. Patient advised to discuss non-opioid treatment plan with their provider. Functional ability and status Nutritional status Physical activity Advanced directives List of other physicians Hospitalizations, surgeries, and ER visits in previous 12 months Vitals Screenings to include cognitive, depression, and falls Referrals and appointments  In addition, I have reviewed and  discussed with patient certain preventive protocols, quality metrics, and best practice recommendations. A written personalized care plan for preventive services as well as general  preventive health recommendations were provided to patient.     Lavell Luster, LPN   [id number]   Nurse Notes: Non Face to Face 45 min appt.   Molly Gutierrez , Thank you for taking time to come for your Medicare Wellness Visit. I appreciate your ongoing commitment to your health goals. Please review the following plan we discussed and let me know if I can assist you in the future.   These are the goals we discussed:  Goals   None     This is a list of the screening recommended for you and due dates:  Health Maintenance  Topic Date Due   COVID-19 Vaccine (4 - Booster for Pfizer series) 09/18/2020   Flu Shot  01/15/2021   Hepatitis C Screening: USPSTF Recommendation to screen - Ages 18-79 yo.  04/17/2021*   Pap Smear  06/17/2021   Tetanus Vaccine  07/08/2029   HIV Screening  Completed   HPV Vaccine  Aged Out  *Topic was postponed. The date shown is not the original due date.

## 2021-03-12 NOTE — Patient Instructions (Signed)
Health Maintenance, Female Adopting a healthy lifestyle and getting preventive care are important in promoting health and wellness. Ask your health care provider about: The right schedule for you to have regular tests and exams. Things you can do on your own to prevent diseases and keep yourself healthy. What should I know about diet, weight, and exercise? Eat a healthy diet  Eat a diet that includes plenty of vegetables, fruits, low-fat dairy products, and lean protein. Do not eat a lot of foods that are high in solid fats, added sugars, or sodium. Maintain a healthy weight Body mass index (BMI) is used to identify weight problems. It estimates body fat based on height and weight. Your health care provider can help determine your BMI and help you achieve or maintain a healthy weight. Get regular exercise Get regular exercise. This is one of the most important things you can do for your health. Most adults should: Exercise for at least 150 minutes each week. The exercise should increase your heart rate and make you sweat (moderate-intensity exercise). Do strengthening exercises at least twice a week. This is in addition to the moderate-intensity exercise. Spend less time sitting. Even light physical activity can be beneficial. Watch cholesterol and blood lipids Have your blood tested for lipids and cholesterol at 40 years of age, then have this test every 5 years. Have your cholesterol levels checked more often if: Your lipid or cholesterol levels are high. You are older than 40 years of age. You are at high risk for heart disease. What should I know about cancer screening? Depending on your health history and family history, you may need to have cancer screening at various ages. This may include screening for: Breast cancer. Cervical cancer. Colorectal cancer. Skin cancer. Lung cancer. What should I know about heart disease, diabetes, and high blood pressure? Blood pressure and heart  disease High blood pressure causes heart disease and increases the risk of stroke. This is more likely to develop in people who have high blood pressure readings, are of African descent, or are overweight. Have your blood pressure checked: Every 3-5 years if you are 18-39 years of age. Every year if you are 40 years old or older. Diabetes Have regular diabetes screenings. This checks your fasting blood sugar level. Have the screening done: Once every three years after age 40 if you are at a normal weight and have a low risk for diabetes. More often and at a younger age if you are overweight or have a high risk for diabetes. What should I know about preventing infection? Hepatitis B If you have a higher risk for hepatitis B, you should be screened for this virus. Talk with your health care provider to find out if you are at risk for hepatitis B infection. Hepatitis C Testing is recommended for: Everyone born from 1945 through 1965. Anyone with known risk factors for hepatitis C. Sexually transmitted infections (STIs) Get screened for STIs, including gonorrhea and chlamydia, if: You are sexually active and are younger than 40 years of age. You are older than 40 years of age and your health care provider tells you that you are at risk for this type of infection. Your sexual activity has changed since you were last screened, and you are at increased risk for chlamydia or gonorrhea. Ask your health care provider if you are at risk. Ask your health care provider about whether you are at high risk for HIV. Your health care provider may recommend a prescription medicine   to help prevent HIV infection. If you choose to take medicine to prevent HIV, you should first get tested for HIV. You should then be tested every 3 months for as long as you are taking the medicine. Pregnancy If you are about to stop having your period (premenopausal) and you may become pregnant, seek counseling before you get  pregnant. Take 400 to 800 micrograms (mcg) of folic acid every day if you become pregnant. Ask for birth control (contraception) if you want to prevent pregnancy. Osteoporosis and menopause Osteoporosis is a disease in which the bones lose minerals and strength with aging. This can result in bone fractures. If you are 65 years old or older, or if you are at risk for osteoporosis and fractures, ask your health care provider if you should: Be screened for bone loss. Take a calcium or vitamin D supplement to lower your risk of fractures. Be given hormone replacement therapy (HRT) to treat symptoms of menopause. Follow these instructions at home: Lifestyle Do not use any products that contain nicotine or tobacco, such as cigarettes, e-cigarettes, and chewing tobacco. If you need help quitting, ask your health care provider. Do not use street drugs. Do not share needles. Ask your health care provider for help if you need support or information about quitting drugs. Alcohol use Do not drink alcohol if: Your health care provider tells you not to drink. You are pregnant, may be pregnant, or are planning to become pregnant. If you drink alcohol: Limit how much you use to 0-1 drink a day. Limit intake if you are breastfeeding. Be aware of how much alcohol is in your drink. In the [place] one drink equals one 12 oz bottle of beer (355 mL), one 5 oz glass of wine (148 mL), or one 1 oz glass of hard liquor (44 mL). General instructions Schedule regular health, dental, and eye exams. Stay current with your vaccines. Tell your health care provider if: You often feel depressed. You have ever been abused or do not feel safe at home. Summary Adopting a healthy lifestyle and getting preventive care are important in promoting health and wellness. Follow your health care provider's instructions about healthy diet, exercising, and getting tested or screened for diseases. Follow your health care provider's  instructions on monitoring your cholesterol and blood pressure. This information is not intended to replace advice given to you by your health care provider. Make sure you discuss any questions you have with your health care provider. Document Revised: 08/11/2020 Document Reviewed: 05/27/2018 Elsevier Patient Education  2022 [employer]  

## 2021-03-17 ENCOUNTER — Emergency Department (HOSPITAL_COMMUNITY): Payer: Medicare Other

## 2021-03-17 ENCOUNTER — Other Ambulatory Visit: Payer: Self-pay

## 2021-03-17 ENCOUNTER — Encounter (HOSPITAL_COMMUNITY): Payer: Self-pay

## 2021-03-17 ENCOUNTER — Inpatient Hospital Stay (HOSPITAL_COMMUNITY)
Admission: EM | Admit: 2021-03-17 | Discharge: 2021-03-21 | DRG: 812 | Disposition: A | Discharge status: Home / Self Care | Payer: Medicare Other | Attending: Internal Medicine | Admitting: Internal Medicine

## 2021-03-17 DIAGNOSIS — Z841 Family history of disorders of kidney and ureter: Secondary | ICD-10-CM | POA: Diagnosis not present

## 2021-03-17 DIAGNOSIS — I2699 Other pulmonary embolism without acute cor pulmonale: Secondary | ICD-10-CM | POA: Diagnosis present

## 2021-03-17 DIAGNOSIS — Z86711 Personal history of pulmonary embolism: Secondary | ICD-10-CM

## 2021-03-17 DIAGNOSIS — F419 Anxiety disorder, unspecified: Secondary | ICD-10-CM | POA: Diagnosis present

## 2021-03-17 DIAGNOSIS — Z9049 Acquired absence of other specified parts of digestive tract: Secondary | ICD-10-CM | POA: Diagnosis not present

## 2021-03-17 DIAGNOSIS — Z833 Family history of diabetes mellitus: Secondary | ICD-10-CM

## 2021-03-17 DIAGNOSIS — Z8249 Family history of ischemic heart disease and other diseases of the circulatory system: Secondary | ICD-10-CM | POA: Diagnosis not present

## 2021-03-17 DIAGNOSIS — D5700 Hb-SS disease with crisis, unspecified: Principal | ICD-10-CM | POA: Diagnosis present

## 2021-03-17 DIAGNOSIS — J9811 Atelectasis: Secondary | ICD-10-CM | POA: Diagnosis not present

## 2021-03-17 DIAGNOSIS — Z79899 Other long term (current) drug therapy: Secondary | ICD-10-CM | POA: Diagnosis not present

## 2021-03-17 DIAGNOSIS — Z20822 Contact with and (suspected) exposure to covid-19: Secondary | ICD-10-CM | POA: Diagnosis not present

## 2021-03-17 DIAGNOSIS — Z83438 Family history of other disorder of lipoprotein metabolism and other lipidemia: Secondary | ICD-10-CM

## 2021-03-17 DIAGNOSIS — J45909 Unspecified asthma, uncomplicated: Secondary | ICD-10-CM | POA: Diagnosis present

## 2021-03-17 DIAGNOSIS — Z7901 Long term (current) use of anticoagulants: Secondary | ICD-10-CM

## 2021-03-17 DIAGNOSIS — F1120 Opioid dependence, uncomplicated: Secondary | ICD-10-CM | POA: Diagnosis present

## 2021-03-17 DIAGNOSIS — F32A Depression, unspecified: Secondary | ICD-10-CM | POA: Diagnosis not present

## 2021-03-17 DIAGNOSIS — G894 Chronic pain syndrome: Secondary | ICD-10-CM | POA: Diagnosis not present

## 2021-03-17 DIAGNOSIS — J4520 Mild intermittent asthma, uncomplicated: Secondary | ICD-10-CM | POA: Diagnosis present

## 2021-03-17 DIAGNOSIS — D72829 Elevated white blood cell count, unspecified: Secondary | ICD-10-CM | POA: Diagnosis not present

## 2021-03-17 DIAGNOSIS — Z888 Allergy status to other drugs, medicaments and biological substances status: Secondary | ICD-10-CM | POA: Diagnosis not present

## 2021-03-17 LAB — CBC WITH DIFFERENTIAL/PLATELET
Abs Immature Granulocytes: 0.33 10*3/uL — ABNORMAL HIGH (ref 0.00–0.07)
Basophils Absolute: 0.1 10*3/uL (ref 0.0–0.1)
Basophils Relative: 1 %
Eosinophils Absolute: 0.2 10*3/uL (ref 0.0–0.5)
Eosinophils Relative: 1 %
HCT: 21.2 % — ABNORMAL LOW (ref 36.0–46.0)
Hemoglobin: 7.1 g/dL — ABNORMAL LOW (ref 12.0–15.0)
Immature Granulocytes: 2 %
Lymphocytes Relative: 20 %
Lymphs Abs: 3.4 10*3/uL (ref 0.7–4.0)
MCH: 29.8 pg (ref 26.0–34.0)
MCHC: 33.5 g/dL (ref 30.0–36.0)
MCV: 89.1 fL (ref 80.0–100.0)
Monocytes Absolute: 2.5 10*3/uL — ABNORMAL HIGH (ref 0.1–1.0)
Monocytes Relative: 14 %
Neutro Abs: 10.7 10*3/uL — ABNORMAL HIGH (ref 1.7–7.7)
Neutrophils Relative %: 62 %
Platelets: 553 10*3/uL — ABNORMAL HIGH (ref 150–400)
RBC: 2.38 MIL/uL — ABNORMAL LOW (ref 3.87–5.11)
RDW: 26.1 % — ABNORMAL HIGH (ref 11.5–15.5)
WBC: 17.1 10*3/uL — ABNORMAL HIGH (ref 4.0–10.5)
nRBC: 3.6 % — ABNORMAL HIGH (ref 0.0–0.2)

## 2021-03-17 LAB — RESP PANEL BY RT-PCR (FLU A&B, COVID) ARPGX2
Influenza A by PCR: NEGATIVE
Influenza B by PCR: NEGATIVE
SARS Coronavirus 2 by RT PCR: NEGATIVE

## 2021-03-17 LAB — RETICULOCYTES
Immature Retic Fract: 32.1 % — ABNORMAL HIGH (ref 2.3–15.9)
RBC.: 2.41 MIL/uL — ABNORMAL LOW (ref 3.87–5.11)
Retic Count, Absolute: 684.5 10*3/uL — ABNORMAL HIGH (ref 19.0–186.0)
Retic Ct Pct: 27.9 % — ABNORMAL HIGH (ref 0.4–3.1)

## 2021-03-17 LAB — COMPREHENSIVE METABOLIC PANEL
ALT: 19 U/L (ref 0–44)
AST: 41 U/L (ref 15–41)
Albumin: 4.4 g/dL (ref 3.5–5.0)
Alkaline Phosphatase: 57 U/L (ref 38–126)
Anion gap: 11 (ref 5–15)
BUN: 12 mg/dL (ref 6–20)
CO2: 22 mmol/L (ref 22–32)
Calcium: 8.9 mg/dL (ref 8.9–10.3)
Chloride: 105 mmol/L (ref 98–111)
Creatinine, Ser: 0.57 mg/dL (ref 0.44–1.00)
GFR, Estimated: >60 mL/min (ref >60)
Glucose, Bld: 96 mg/dL (ref 70–99)
Potassium: 3.1 mmol/L — ABNORMAL LOW (ref 3.5–5.1)
Sodium: 138 mmol/L (ref 135–145)
Total Bilirubin: 3.7 mg/dL — ABNORMAL HIGH (ref 0.3–1.2)
Total Protein: 7.8 g/dL (ref 6.5–8.1)

## 2021-03-17 LAB — PROTIME-INR
INR: 1.3 — ABNORMAL HIGH (ref 0.8–1.2)
Prothrombin Time: 16.6 seconds — ABNORMAL HIGH (ref 11.4–15.2)

## 2021-03-17 LAB — HCG, QUANTITATIVE, PREGNANCY: hCG, Beta Chain, Quant, S: 1 m[IU]/mL (ref <5)

## 2021-03-17 MED ORDER — HYDROMORPHONE HCL 2 MG/ML IJ SOLN
2.0000 mg | INTRAMUSCULAR | Status: AC
  Administered 2021-03-17: 2 mg via INTRAVENOUS
  Filled 2021-03-17: qty 1

## 2021-03-17 MED ORDER — NALOXONE HCL 0.4 MG/ML IJ SOLN: 0.4000 mg | INTRAMUSCULAR | Status: DC | PRN

## 2021-03-17 MED ORDER — MOMETASONE FURO-FORMOTEROL FUM 100-5 MCG/ACT IN AERO
2.0000 | INHALATION_SPRAY | Freq: Two times a day (BID) | RESPIRATORY_TRACT | Status: DC
  Administered 2021-03-17 – 2021-03-18 (×3): 2 via RESPIRATORY_TRACT
  Filled 2021-03-17: qty 8.8

## 2021-03-17 MED ORDER — HYDROMORPHONE 1 MG/ML IV SOLN
INTRAVENOUS | Status: DC
  Administered 2021-03-17: 30 mg via INTRAVENOUS
  Administered 2021-03-17: 5 mg via INTRAVENOUS
  Administered 2021-03-17: 4 mg via INTRAVENOUS
  Administered 2021-03-18: 5.5 mg via INTRAVENOUS
  Administered 2021-03-18: 30 mg via INTRAVENOUS
  Administered 2021-03-18: 7 mg via INTRAVENOUS
  Administered 2021-03-18: 4 mg via INTRAVENOUS
  Administered 2021-03-18: 5 mg via INTRAVENOUS
  Administered 2021-03-18: 2.5 mg via INTRAVENOUS
  Administered 2021-03-19: 30 mg via INTRAVENOUS
  Administered 2021-03-19: 1.5 mg via INTRAVENOUS
  Administered 2021-03-19: 5.5 mg via INTRAVENOUS
  Administered 2021-03-19: 4 mg via INTRAVENOUS
  Administered 2021-03-19: 4.5 mg via INTRAVENOUS
  Administered 2021-03-19: 1 mg via INTRAVENOUS
  Administered 2021-03-19: 5.5 mg via INTRAVENOUS
  Administered 2021-03-19: 5 mg via INTRAVENOUS
  Administered 2021-03-20: 1.5 mg via INTRAVENOUS
  Administered 2021-03-20: 4 mg via INTRAVENOUS
  Administered 2021-03-20: 3 mg via INTRAVENOUS
  Filled 2021-03-17 (×3): qty 30

## 2021-03-17 MED ORDER — ONDANSETRON HCL 4 MG/2ML IJ SOLN
4.0000 mg | Freq: Four times a day (QID) | INTRAMUSCULAR | Status: DC | PRN
  Administered 2021-03-19: 4 mg via INTRAVENOUS
  Filled 2021-03-17: qty 2

## 2021-03-17 MED ORDER — VITAMIN B-12 1000 MCG PO TABS
1000.0000 ug | ORAL_TABLET | Freq: Every day | ORAL | Status: DC
  Administered 2021-03-17 – 2021-03-21 (×5): 1000 ug via ORAL
  Filled 2021-03-17 (×5): qty 1

## 2021-03-17 MED ORDER — KETOROLAC TROMETHAMINE 15 MG/ML IJ SOLN
15.0000 mg | Freq: Four times a day (QID) | INTRAMUSCULAR | Status: DC
Start: 2021-03-17 — End: 2021-03-21
  Administered 2021-03-17 – 2021-03-21 (×16): 15 mg via INTRAVENOUS
  Filled 2021-03-17 (×16): qty 1

## 2021-03-17 MED ORDER — SENNOSIDES-DOCUSATE SODIUM 8.6-50 MG PO TABS
1.0000 | ORAL_TABLET | Freq: Two times a day (BID) | ORAL | Status: DC
  Administered 2021-03-17 – 2021-03-21 (×8): 1 via ORAL
  Filled 2021-03-17 (×8): qty 1

## 2021-03-17 MED ORDER — VITAMIN D3 25 MCG (1000 UNIT) PO TABS
1000.0000 [IU] | ORAL_TABLET | Freq: Every day | ORAL | Status: DC
  Administered 2021-03-17 – 2021-03-21 (×5): 1000 [IU] via ORAL
  Filled 2021-03-17 (×5): qty 1

## 2021-03-17 MED ORDER — DIPHENHYDRAMINE HCL 25 MG PO CAPS
25.0000 mg | ORAL_CAPSULE | ORAL | Status: DC | PRN
  Administered 2021-03-17 – 2021-03-20 (×7): 25 mg via ORAL
  Filled 2021-03-17 (×7): qty 1

## 2021-03-17 MED ORDER — IOHEXOL 350 MG/ML SOLN
80.0000 mL | Freq: Once | INTRAVENOUS | Status: AC | PRN
  Administered 2021-03-17: 80 mL via INTRAVENOUS

## 2021-03-17 MED ORDER — HYDROMORPHONE HCL 2 MG/ML IJ SOLN
2.0000 mg | Freq: Once | INTRAMUSCULAR | Status: AC
  Administered 2021-03-17: 2 mg via INTRAVENOUS
  Filled 2021-03-17: qty 1

## 2021-03-17 MED ORDER — HYDROMORPHONE HCL 1 MG/ML IJ SOLN
1.0000 mg | INTRAMUSCULAR | Status: DC | PRN
  Administered 2021-03-17: 1 mg via INTRAVENOUS
  Filled 2021-03-17: qty 1

## 2021-03-17 MED ORDER — DEFERASIROX 360 MG PO TABS: 1080.0000 mg | ORAL_TABLET | Freq: Every day | ORAL | Status: DC

## 2021-03-17 MED ORDER — POLYETHYLENE GLYCOL 3350 17 G PO PACK: 17.0000 g | PACK | Freq: Every day | ORAL | Status: DC | PRN

## 2021-03-17 MED ORDER — DIPHENHYDRAMINE HCL 50 MG/ML IJ SOLN
25.0000 mg | Freq: Once | INTRAMUSCULAR | Status: AC
  Administered 2021-03-17: 25 mg via INTRAVENOUS
  Filled 2021-03-17: qty 1

## 2021-03-17 MED ORDER — KETOROLAC TROMETHAMINE 15 MG/ML IJ SOLN
15.0000 mg | INTRAMUSCULAR | Status: AC
  Administered 2021-03-17: 15 mg via INTRAVENOUS
  Filled 2021-03-17: qty 1

## 2021-03-17 MED ORDER — SODIUM CHLORIDE 0.9% FLUSH: 9.0000 mL | INTRAVENOUS | Status: DC | PRN

## 2021-03-17 MED ORDER — SODIUM CHLORIDE 0.9 % IV SOLN
25.0000 mg | INTRAVENOUS | Status: DC | PRN
  Administered 2021-03-18 (×2): 25 mg via INTRAVENOUS
  Filled 2021-03-17 (×2): qty 25
  Filled 2021-03-17: qty 0.5

## 2021-03-17 MED ORDER — DEXTROSE-NACL 5-0.45 % IV SOLN: INTRAVENOUS | Status: DC

## 2021-03-17 MED ORDER — VOXELOTOR 500 MG PO TABS: 1500.0000 mg | ORAL_TABLET | Freq: Every day | ORAL | Status: DC

## 2021-03-17 MED ORDER — ALBUTEROL SULFATE (2.5 MG/3ML) 0.083% IN NEBU: 3.0000 mL | INHALATION_SOLUTION | Freq: Two times a day (BID) | RESPIRATORY_TRACT | Status: DC | PRN

## 2021-03-17 MED ORDER — MIRTAZAPINE 30 MG PO TABS
45.0000 mg | ORAL_TABLET | Freq: Every day | ORAL | Status: DC
  Administered 2021-03-17 – 2021-03-20 (×4): 45 mg via ORAL
  Filled 2021-03-17 (×4): qty 1

## 2021-03-17 MED ORDER — ONDANSETRON HCL 4 MG/2ML IJ SOLN
4.0000 mg | INTRAMUSCULAR | Status: DC | PRN
  Administered 2021-03-17: 4 mg via INTRAVENOUS
  Filled 2021-03-17: qty 2

## 2021-03-17 MED ORDER — POTASSIUM CHLORIDE CRYS ER 20 MEQ PO TBCR
40.0000 meq | EXTENDED_RELEASE_TABLET | Freq: Once | ORAL | Status: AC
  Administered 2021-03-17: 40 meq via ORAL
  Filled 2021-03-17: qty 2

## 2021-03-17 MED ORDER — FOLIC ACID 1 MG PO TABS
1.0000 mg | ORAL_TABLET | Freq: Every day | ORAL | Status: DC
  Administered 2021-03-17 – 2021-03-21 (×5): 1 mg via ORAL
  Filled 2021-03-17 (×5): qty 1

## 2021-03-17 MED ORDER — RIVAROXABAN 20 MG PO TABS
20.0000 mg | ORAL_TABLET | Freq: Every day | ORAL | Status: DC
  Administered 2021-03-17 – 2021-03-20 (×4): 20 mg via ORAL
  Filled 2021-03-17 (×4): qty 1

## 2021-03-17 NOTE — ED Triage Notes (Signed)
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  

## 2021-03-17 NOTE — ED Provider Notes (Signed)
[facility] DEPT Provider Note   CSN: [id number] Arrival date & time: 03/17/21  0807     History Chief Complaint  Patient presents with   Sickle Cell Pain Crisis    Molly Gutierrez is a 40 y.o. female.  HPI Patient reports she has been in continuous pain for 3 days, since last Thursday.  She reports she is taking her home oral hydromorphone without pain relief.  She reports that her pain is mostly on the right side of the chest but some on the left as well.  She reports her sickle cell crisis typically involves chest pain.  Patient denies she feels significantly short of breath.  She denies fever or productive cough she does report she has been nauseated and had vomiting.  She reports she vomited twice this morning.  No abdominal pain.  No lower extremity pain or calf pain.  Patient does have history of pulmonary embolus.  She reports she is compliant with Xarelto and has not missed doses.  She reports she typically takes it in the evening is not due for dose currently.    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   Sickle cell anemia with pain ([place]) 04/24/2020   Mild intermittent asthma 03/31/2020   Sickle cell anemia with crisis ([place]) 01/07/2020   Sickle cell crisis acute chest syndrome ([place]) 07/29/2019   Drug-seeking behavior 07/07/2019   Sinus tachycardia 07/07/2019   Therapeutic opioid-induced constipation ([facility]) 07/07/2019   Breast mass in female 06/29/2019   Atelectasis of right lung 06/29/2019   Sickle cell crisis ([place]) 06/27/2019   Sickle cell pain crisis ([place]) 04/19/2019   Pneumonia 03/27/2019   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   Anterior chest wall pain 11/13/2018    Epigastric abdominal pain 11/13/2018   Hematuria 11/13/2018   Hyperbilirubinemia 11/12/2018   Long term current use of anticoagulant therapy 11/12/2018   History of pulmonary embolism 09/26/2018   Hx of pulmonary embolism ([place]) 09/26/2018   Opioid dependence ([place]) 09/13/2018   Port-A-Cath in place 09/13/2018   Narcotic abuse, continuous ([place]) 08/28/2018   Anemia 07/03/2018   Personal history of other venous thrombosis and embolism 07/03/2018   History of transfusion 07/03/2018   Gastro-esophageal reflux disease without esophagitis 06/02/2018   Asthma 05/19/2018   Anxiety and depression 10/30/2017   At risk for sepsis 06/13/2017   Mitral regurgitation 02/01/2014   Itching 12/13/2013   Nausea & vomiting 12/13/2013   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   Frequent complaints of pain 11/01/2013   Hypokalemia 09/19/2013   S/P total hip arthroplasty 05/19/2013   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, pelvic region and thigh 05/12/2013   Lower urinary tract infectious disease 03/02/2013   Chest pain 11/14/2012   Sickle-cell anemia ([place]) 10/30/2012   Pain management 08/01/2012   Reticulocytosis 07/31/2012   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     Family History  Problem Relation Age of Onset   Renal Disease Mother    Hypertension Mother    High Cholesterol Mother    Heart attack Mother    Diabetes Brother     Social History   Tobacco Use   Smoking status: Never   Smokeless tobacco: Never   Vaping Use   Vaping Use: Never used  Substance Use Topics   Alcohol use: Never   Drug use: Never    Home Medications Prior to Admission medications   Medication Sig Start Date End Date Taking? Authorizing Provider  albuterol (VENTOLIN HFA) 108 (90 Base) MCG/ACT inhaler Inhale 2 puffs into the lungs 2 (two) times daily as needed for wheezing or shortness of breath. 04/13/20 04/13/21 Yes Vevelyn Francois, NP  celecoxib (CELEBREX) 200 MG capsule Take 200 mg by mouth daily as needed for moderate pain or mild pain. 02/09/21  Yes [provider]  Cholecalciferol (VITAMIN D3) 25 MCG (1000 UT) CAPS Take 1 capsule (1,000 Units total) by mouth daily. 04/13/20 04/13/21 Yes Vevelyn Francois, NP  Deferasirox (JADENU) 360 MG TABS Take 3 tablets (1,080 mg total) by mouth daily before breakfast. Patient taking differently: Take 1,080 mg by mouth at bedtime. 04/13/20 04/13/21 Yes Vevelyn Francois, NP  diphenhydrAMINE (BENADRYL) 25 mg capsule Take 25 mg by mouth 3 (three) times daily as needed for itching.   Yes [provider]  folic acid (FOLVITE) 1 MG tablet Take 1 tablet (1 mg total) by mouth daily. 04/13/20 04/13/21 Yes King, Diona Foley, NP  HYDROmorphone (DILAUDID) 4 MG tablet Take 1 tablet (4 mg total) by mouth every 6 (six) hours as needed for up to 15 days for moderate pain or severe pain. 03/11/21 03/26/21 Yes Vevelyn Francois, NP  mirtazapine (REMERON) 45 MG tablet Take 1 tablet (45 mg total) by mouth at bedtime. 04/13/20 04/13/21 Yes King, Diona Foley, NP  mometasone-formoterol (DULERA) 100-5 MCG/ACT AERO Inhale 2 puffs into the lungs daily as needed for wheezing or shortness of breath. 04/13/20 04/13/21 Yes Vevelyn Francois, NP  omeprazole (PRILOSEC) 20 MG capsule Take 1 capsule (20 mg total) by mouth 2 (two) times daily before a meal. 08/02/20 08/02/21 Yes King, Diona Foley, NP  polyethylene glycol (MIRALAX / GLYCOLAX) 17 g packet Take 17 g by mouth daily as needed for mild constipation (MIX  AND DRINK).   Yes [provider]  promethazine (PHENERGAN) 25 MG tablet Take 0.5-1 tablets (12.5-25 mg total) by mouth every 6 (six) hours as needed for nausea or vomiting. 10/25/20 10/25/21 Yes Vevelyn Francois, NP  vitamin B-12 (CYANOCOBALAMIN) 1000 MCG tablet Take 1 tablet (1,000 mcg total) by mouth daily. 04/13/20 04/13/21 Yes King, Diona Foley, NP  voxelotor (OXBRYTA) 500 MG TABS tablet Take 1,500 mg by mouth at bedtime.   Yes [provider]  XARELTO 20 MG TABS tablet Take 1 tablet (20 mg total) by mouth daily with supper. 10/25/20 10/25/21 Yes Vevelyn Francois, NP    Allergies    Cefaclor, Hydroxyurea, Omeprazole, and Ketamine  Review of Systems   Review of Systems 10 systems reviewed and negative except as per HPI Physical Exam Updated Vital Signs BP 107/74 (BP Location: Left Arm)   Pulse 86   Temp 98.4 F (36.9 C) (Oral)  Resp 16   Ht 5\' 3"  (1.6 m)   Wt 57.6 kg   LMP 03/07/2021   SpO2 93%   BMI 22.50 kg/m   Physical Exam Constitutional:      Comments: Alert and nontoxic.  No respiratory distress.  Patient appears moderately uncomfortable.  Well-nourished well-developed.  HENT:     Head: Normocephalic and atraumatic.     Mouth/Throat:     Pharynx: Oropharynx is clear.  Eyes:     Extraocular Movements: Extraocular movements intact.  Cardiovascular:     Rate and Rhythm: Normal rate and regular rhythm.  Pulmonary:     Effort: Pulmonary effort is normal.     Breath sounds: Normal breath sounds.     Comments: Patient has port that is accessed in the right upper chest.  This is clean dry intact without any surrounding erythema or soft tissue changes.  Patient endorses diffuse discomfort to palpation over the right chest.  Chest wall otherwise normal. Abdominal:     General: There is no distension.     Palpations: Abdomen is soft.     Tenderness: There is no abdominal tenderness. There is no guarding.  Musculoskeletal:        General: No swelling or  tenderness. Normal range of motion.     Cervical back: Neck supple.     Right lower leg: No edema.     Left lower leg: No edema.  Skin:    General: Skin is warm and dry.  Neurological:     General: No focal deficit present.     Mental Status: She is oriented to person, place, and time.     Coordination: Coordination normal.    ED Results / Procedures / Treatments   Labs (all labs ordered are listed, but only abnormal results are displayed) Labs Reviewed  COMPREHENSIVE METABOLIC PANEL - Abnormal; Notable for the following components:      Result Value   Potassium 3.1 (*)    Total Bilirubin 3.7 (*)    All other components within normal limits  CBC WITH DIFFERENTIAL/PLATELET - Abnormal; Notable for the following components:   WBC 17.1 (*)    RBC 2.38 (*)    Hemoglobin 7.1 (*)    HCT 21.2 (*)    RDW 26.1 (*)    Platelets 553 (*)    nRBC 3.6 (*)    Neutro Abs 10.7 (*)    Monocytes Absolute 2.5 (*)    Abs Immature Granulocytes 0.33 (*)    All other components within normal limits  RETICULOCYTES - Abnormal; Notable for the following components:   Retic Ct Pct 27.9 (*)    RBC. 2.41 (*)    Retic Count, Absolute 684.5 (*)    Immature Retic Fract 32.1 (*)    All other components within normal limits  PROTIME-INR - Abnormal; Notable for the following components:   Prothrombin Time 16.6 (*)    INR 1.3 (*)    All other components within normal limits  RESP PANEL BY RT-PCR (FLU A&B, COVID) ARPGX2  SARS CORONAVIRUS 2 (TAT 6-24 HRS)  HCG, QUANTITATIVE, PREGNANCY    EKG EKG Interpretation  Date/Time:  Saturday March 17 2021 08:24:12 EDT Ventricular Rate:  87 PR Interval:  142 QRS Duration: 82 QT Interval:  381 QTC Calculation: 459 R Axis:   58 Text Interpretation: Sinus rhythm Abnormal R-wave progression, early transition Borderline T abnormalities, anterior leads no sig change from previous Confirmed by Charlesetta Shanks [phone]) on 03/17/2021 10:30:05 AM  Radiology CT  Angio  Chest PE W/Cm &/Or Wo Cm  Result Date: 03/17/2021 CLINICAL DATA:  Pulmonary embolus suspected. Chest pain since Thursday. Sickle cell crisis. EXAM: CT ANGIOGRAPHY CHEST WITH CONTRAST TECHNIQUE: Multidetector CT imaging of the chest was performed using the standard protocol during bolus administration of intravenous contrast. Multiplanar CT image reconstructions and MIPs were obtained to evaluate the vascular anatomy. CONTRAST:  65mL OMNIPAQUE IOHEXOL 350 MG/ML SOLN COMPARISON:  01/07/2020 FINDINGS: Cardiovascular: Pulmonary arteries are well opacified the contrast bolus. There is no acute pulmonary embolus. Heart size is normal. No pericardial effusion. No significant atherosclerotic calcifications of the thoracic aorta. Mediastinum/Nodes: The visualized portion of the thyroid gland has a normal appearance. Esophagus is normal. No significant mediastinal, hilar, or axillary adenopathy. Lungs/Pleura: No consolidations or pleural effusions. No suspicious pulmonary nodules. Airways are patent. Upper Abdomen: Chronic infarction of the spleen. Otherwise UPPER abdomen is unremarkable. Musculoskeletal: 3.1 centimeter low-attenuation adjacent to T8-9, stable in appearance and consistent with extra medullary hematopoiesis. Chronic diffuse osseous changes consistent with sickle cell disease. Partially calcified mass within the RIGHT breast measures 2.6 centimeters, similar in appearance to prior study from 04/19/2019. Review of the MIP images confirms the above findings. IMPRESSION: 1. No evidence for acute pulmonary embolus. 2. No consolidations or pleural effusions. 3. RIGHT breast mass measures 2.6 centimeters. Recommend further characterization with bilateral diagnostic mammogram and RIGHT breast ultrasound when possible. Electronically Signed   By: Nolon Nations M.D.   On: 03/17/2021 11:27   DG Chest Port 1 View  Result Date: 03/17/2021 CLINICAL DATA:  Chest pain and sickle cell crisis. EXAM: PORTABLE CHEST 1  VIEW COMPARISON:  02/17/2021 FINDINGS: RIGHT-sided Port-A-Cath tip overlies the level of superior vena cava. LEFT-sided PowerPort tip overlies the LOWER superior vena cava-UPPER RIGHT atrium. Heart size is within normal limits. Faint interstitial opacities throughout the lungs, similar to prior. Minimal atelectasis or scarring at the RIGHT lung base. No consolidations. No pleural effusions. IMPRESSION: RIGHT LOWER lobe atelectasis. Stable minimal interstitial prominence. Electronically Signed   By: Nolon Nations M.D.   On: 03/17/2021 09:24    Procedures Procedures   Medications Ordered in ED Medications  dextrose 5 %-0.45 % sodium chloride infusion ( Intravenous New Bag/Given 03/17/21 1701)  mirtazapine (REMERON) tablet 45 mg (45 mg Oral Given 60/1/09 3235)  folic acid (FOLVITE) tablet 1 mg (1 mg Oral Given 03/17/21 1717)  vitamin B-12 (CYANOCOBALAMIN) tablet 1,000 mcg (1,000 mcg Oral Given 03/17/21 1717)  voxelotor (OXBRYTA) tablet 1,500 mg (1,500 mg Oral Not Given 03/17/21 2149)  rivaroxaban (XARELTO) tablet 20 mg (20 mg Oral Given 03/17/21 1717)  Deferasirox TABS 1,080 mg (has no administration in time range)  cholecalciferol (VITAMIN D) tablet 1,000 Units (1,000 Units Oral Given 03/17/21 1717)  albuterol (PROVENTIL) (2.5 MG/3ML) 0.083% nebulizer solution 3 mL (has no administration in time range)  mometasone-formoterol (DULERA) 100-5 MCG/ACT inhaler 2 puff (2 puffs Inhalation Given 03/17/21 2133)  senna-docusate (Senokot-S) tablet 1 tablet (1 tablet Oral Given 03/17/21 2148)  polyethylene glycol (MIRALAX / GLYCOLAX) packet 17 g (has no administration in time range)  ketorolac (TORADOL) 15 MG/ML injection 15 mg (15 mg Intravenous Given 03/18/21 0601)  naloxone (NARCAN) injection 0.4 mg (has no administration in time range)    And  sodium chloride flush (NS) 0.9 % injection 9 mL (has no administration in time range)  ondansetron (ZOFRAN) injection 4 mg (has no administration in time range)   diphenhydrAMINE (BENADRYL) capsule 25 mg ( Oral See Alternative 03/18/21 0601)    Or  diphenhydrAMINE (BENADRYL) 25 mg in sodium chloride 0.9 % 50 mL IVPB (25 mg Intravenous New Bag/Given 03/18/21 0601)  HYDROmorphone (DILAUDID) 1 mg/mL PCA injection (5.5 mg Intravenous Received 03/18/21 0405)  HYDROmorphone (DILAUDID) injection 1 mg (1 mg Intravenous Given 03/17/21 1354)  fluticasone (FLONASE) 50 MCG/ACT nasal spray 2 spray (2 sprays Each Nare Given 03/18/21 0644)  ketorolac (TORADOL) 15 MG/ML injection 15 mg (15 mg Intravenous Given 03/17/21 0917)  HYDROmorphone (DILAUDID) injection 2 mg (2 mg Intravenous Given 03/17/21 0917)  HYDROmorphone (DILAUDID) injection 2 mg (2 mg Intravenous Given 03/17/21 1003)  diphenhydrAMINE (BENADRYL) injection 25 mg (25 mg Intravenous Given 03/17/21 0917)  iohexol (OMNIPAQUE) 350 MG/ML injection 80 mL (80 mLs Intravenous Contrast Given 03/17/21 1052)  HYDROmorphone (DILAUDID) injection 2 mg (2 mg Intravenous Given 03/17/21 1128)  diphenhydrAMINE (BENADRYL) injection 25 mg (25 mg Intravenous Given 03/17/21 1127)  potassium chloride SA (KLOR-CON) CR tablet 40 mEq (40 mEq Oral Given 03/17/21 1151)    ED Course  I have reviewed the triage vital signs and the nursing notes.  Pertinent labs & imaging results that were available during my care of the patient were reviewed by me and considered in my medical decision making (see chart for details).    MDM Rules/Calculators/A&P                           Consult: Reviewed with [place] Hollis.  She will see patient in the emergency department for disposition and pain management  Patient presents with chest pain which she reports typical for her sickle cell crisis.  She denies fever.  She does report some vomiting.  Patient is alert without respiratory distress.  Clinically well in appearance.  Patient does have prior history of pulmonary embolus.  CT PE study obtained and negative.  Patient did not feel she had pain control after  completing sickle cell pain protocol and also 1 additional dose of Dilaudid.  Sickle cell team member, [place] Hollis will see patient in the emergency department for final disposition on inpatient management versus discharge with close clinic follow-up.  Final Clinical Impression(s) / ED Diagnoses Final diagnoses:  Sickle cell pain crisis [facility])    Rx / DC Orders ED Discharge Orders     None        Charlesetta Shanks, MD 03/18/21 [phone]

## 2021-03-17 NOTE — H&P (Signed)
H&P  Patient Demographics:  Molly Gutierrez, is a 40 y.o. female  MRN: [id number]   DOB - January 23, 1981  Admit Date - 03/17/2021  Outpatient Primary MD for the patient is Molly Francois, NP  Chief Complaint  Patient presents with   Sickle Cell Pain Crisis      HPI:   Molly Gutierrez  is a 40 y.o. female with a medical history significant for sickle cell disease, chronic pain syndrome, opiate dependence and tolerance, history of PE on Xarelto, history of mild intermittent asthma, and history of anemia of chronic disease presents to the emergency department with pain primarily to right chest that is consistent with her previous sickle cell pain crisis.  Patient was in usual state of health until 2 days prior.  She attributes current pain crisis to changes in weather.  Patient rates her pain at 8/10 characterized as constant, aching, and throbbing.  She denies headache, shortness of breath, dizziness.  No fever, no chills, no urinary symptoms, nausea, vomiting, or diarrhea.  No sick contacts, no recent travel, or exposure to COVID-19.  Of note, patient has been fully vaccinated against COVID-19.  ER course: While in ER, vital signs show: BP 103/70, heart rate 81, respirations 12, oxygen saturation 92% on RA.  Complete blood c platelets 553,000.  [place] shows: WBC 17.1, hemoglobin 7.1 g/dL, and platelets 553,000.  Complete metabolic panel shows [place] patient has a robust reticulocytosis.  hCG negative.  ium 3.1 and total bilirubin 3.7, otherwise unremarkable.  COVID-19 pending.  Chest x-ray shows right lower lobe atelectasis.  Stable minimal interstitial prominence.  CT angiogram of chest shows no evidence for acute pulmonary embolus, no consolidations or pleural effusions.  Right breast measures 2.6 cm.  Recommend further characterization.  This is not a new finding.  Patient's pain persists despite Dilaudid 2 mg x 3 doses, IV Toradol, IV fluids, and Benadryl.  Patient will be admitted in  observation for further control of sickle cell related pain.   Review of systems:  Review of Systems  Constitutional:  Negative for chills and fever.  HENT: Negative.    Eyes: Negative.   Respiratory: Negative.    Cardiovascular: Negative.   Gastrointestinal: Negative.   Genitourinary: Negative.   Musculoskeletal:  Positive for back pain and joint pain.  Skin: Negative.   Neurological: Negative.   Psychiatric/Behavioral: Negative.    .  With Past History of the following :   Past Medical History:  Diagnosis Date   Asthma    Eczema    History of pulmonary embolus (PE)    Sickle cell anemia (HCC)       Past Surgical History:  Procedure Laterality Date   CHOLECYSTECTOMY     ERCP     JOINT REPLACEMENT     PORTA CATH INSERTION     TUBAL LIGATION     WISDOM TOOTH EXTRACTION       Social History:   Social History   Tobacco Use   Smoking status: Never   Smokeless tobacco: Never  Substance Use Topics   Alcohol use: Never     Lives - At home   Family History :   Family History  Problem Relation Age of Onset   Renal Disease Mother    Hypertension Mother    High Cholesterol Mother    Heart attack Mother    Diabetes Brother      Home Medications:   Prior to Admission medications   Medication Sig Start Date End Date Taking?  Authorizing Provider  albuterol (VENTOLIN HFA) 108 (90 Base) MCG/ACT inhaler Inhale 2 puffs into the lungs 2 (two) times daily as needed for wheezing or shortness of breath. 04/13/20 04/13/21 Yes Molly Francois, NP  celecoxib (CELEBREX) 200 MG capsule Take 200 mg by mouth daily as needed for moderate pain or mild pain. 02/09/21  Yes [provider]  Cholecalciferol (VITAMIN D3) 25 MCG (1000 UT) CAPS Take 1 capsule (1,000 Units total) by mouth daily. 04/13/20 04/13/21 Yes Molly Francois, NP  Deferasirox (JADENU) 360 MG TABS Take 3 tablets (1,080 mg total) by mouth daily before breakfast. Patient taking differently: Take 1,080 mg by  mouth at bedtime. 04/13/20 04/13/21 Yes Molly Francois, NP  diphenhydrAMINE (BENADRYL) 25 mg capsule Take 25 mg by mouth 3 (three) times daily as needed for itching.   Yes [provider]  folic acid (FOLVITE) 1 MG tablet Take 1 tablet (1 mg total) by mouth daily. 04/13/20 04/13/21 Yes King, Diona Foley, NP  HYDROmorphone (DILAUDID) 4 MG tablet Take 1 tablet (4 mg total) by mouth every 6 (six) hours as needed for up to 15 days for moderate pain or severe pain. 03/11/21 03/26/21 Yes Molly Francois, NP  mirtazapine (REMERON) 45 MG tablet Take 1 tablet (45 mg total) by mouth at bedtime. 04/13/20 04/13/21 Yes King, Diona Foley, NP  mometasone-formoterol (DULERA) 100-5 MCG/ACT AERO Inhale 2 puffs into the lungs daily as needed for wheezing or shortness of breath. 04/13/20 04/13/21 Yes Molly Francois, NP  omeprazole (PRILOSEC) 20 MG capsule Take 1 capsule (20 mg total) by mouth 2 (two) times daily before a meal. 08/02/20 08/02/21 Yes King, Diona Foley, NP  polyethylene glycol (MIRALAX / GLYCOLAX) 17 g packet Take 17 g by mouth daily as needed for mild constipation (MIX AND DRINK).   Yes [provider]  promethazine (PHENERGAN) 25 MG tablet Take 0.5-1 tablets (12.5-25 mg total) by mouth every 6 (six) hours as needed for nausea or vomiting. 10/25/20 10/25/21 Yes Molly Francois, NP  vitamin B-12 (CYANOCOBALAMIN) 1000 MCG tablet Take 1 tablet (1,000 mcg total) by mouth daily. 04/13/20 04/13/21 Yes King, Diona Foley, NP  voxelotor (OXBRYTA) 500 MG TABS tablet Take 1,500 mg by mouth at bedtime.   Yes [provider]  XARELTO 20 MG TABS tablet Take 1 tablet (20 mg total) by mouth daily with supper. 10/25/20 10/25/21 Yes Molly Francois, NP     Allergies:   Allergies  Allergen Reactions   Cefaclor Hives and Swelling   Hydroxyurea Palpitations and Other (See Comments)    Lowers "blood levels" and heart rate (causes HYPOtension); "it messes me up, it drops my levels and stuff"    Omeprazole  Anaphylaxis and Other (See Comments)    Causes "sharp pains in the stomach"   Ketamine Palpitations and Other (See Comments)    "Pt states she has had previous reaction to ketamine. States she becomes flushed, heart races, dizzy, and feels like she is going to pass out."     Physical Exam:   Vitals:   Vitals:   03/17/21 1130 03/17/21 1207  BP: 104/73 103/70  Pulse: 73 77  Resp: 11 12  Temp:    SpO2: 94% 95%    Physical Exam: Constitutional: Patient appears well-developed and well-nourished. Not in obvious distress. HENT: Normocephalic, atraumatic, External right and left ear normal. Oropharynx is clear and moist.  Eyes: Conjunctivae and EOM are normal. PERRLA, no scleral icterus. Neck: Normal ROM. Neck supple. No JVD.  No tracheal deviation. No thyromegaly. CVS: RRR, S1/S2 +, no murmurs, no gallops, no carotid bruit.  Pulmonary: Effort and breath sounds normal, no stridor, rhonchi, wheezes, rales.  Abdominal: Soft. BS +, no distension, tenderness, rebound or guarding.  Musculoskeletal: Normal range of motion. No edema and no tenderness.  Lymphadenopathy: No lymphadenopathy noted, cervical, inguinal or axillary Neuro: Alert. Normal reflexes, muscle tone coordination. No cranial nerve deficit. Skin: Skin is warm and dry. No rash noted. Not diaphoretic. No erythema. No pallor. Psychiatric: Normal mood and affect. Behavior, judgment, thought content normal.   Data Review:   CBC Recent Labs  Lab 03/17/21 0845  WBC 17.1*  HGB 7.1*  HCT 21.2*  PLT 553*  MCV 89.1  MCH 29.8  MCHC 33.5  RDW 26.1*  LYMPHSABS 3.4  MONOABS 2.5*  EOSABS 0.2  BASOSABS 0.1   ------------------------------------------------------------------------------------------------------------------  Chemistries  Recent Labs  Lab 03/17/21 0845  NA 138  K 3.1*  CL 105  CO2 22  GLUCOSE 96  BUN 12  CREATININE 0.57  CALCIUM 8.9  AST 41  ALT 19  ALKPHOS 57  BILITOT 3.7*    ------------------------------------------------------------------------------------------------------------------ estimated creatinine clearance is 78.1 mL/min (by C-G formula based on SCr of 0.57 mg/dL). ------------------------------------------------------------------------------------------------------------------ No results for input(s): TSH, T4TOTAL, T3FREE, THYROIDAB in the last 72 hours.  Invalid input(s): FREET3  Coagulation profile Recent Labs  Lab 03/17/21 0845  INR 1.3*   ------------------------------------------------------------------------------------------------------------------- No results for input(s): DDIMER in the last 72 hours. -------------------------------------------------------------------------------------------------------------------  Cardiac Enzymes No results for input(s): CKMB, TROPONINI, MYOGLOBIN in the last 168 hours.  Invalid input(s): CK ------------------------------------------------------------------------------------------------------------------    Component Value Date/Time   BNP 70.6 05/05/2019 0204    ---------------------------------------------------------------------------------------------------------------  Urinalysis    Component Value Date/Time   COLORURINE YELLOW 11/22/2020 0430   APPEARANCEUR HAZY (A) 11/22/2020 0430   LABSPEC 1.010 11/22/2020 0430   PHURINE 6.0 11/22/2020 0430   GLUCOSEU NEGATIVE 11/22/2020 0430   HGBUR SMALL (A) 11/22/2020 0430   BILIRUBINUR NEGATIVE 11/22/2020 0430   BILIRUBINUR neg 08/03/2020 1429   KETONESUR NEGATIVE 11/22/2020 0430   PROTEINUR NEGATIVE 11/22/2020 0430   UROBILINOGEN 1.0 08/03/2020 1429   NITRITE NEGATIVE 11/22/2020 0430   LEUKOCYTESUR NEGATIVE 11/22/2020 0430    ----------------------------------------------------------------------------------------------------------------   Imaging Results:    CT Angio Chest PE W/Cm &/Or Wo Cm  Result Date: 03/17/2021 CLINICAL  DATA:  Pulmonary embolus suspected. Chest pain since Thursday. Sickle cell crisis. EXAM: CT ANGIOGRAPHY CHEST WITH CONTRAST TECHNIQUE: Multidetector CT imaging of the chest was performed using the standard protocol during bolus administration of intravenous contrast. Multiplanar CT image reconstructions and MIPs were obtained to evaluate the vascular anatomy. CONTRAST:  32mL OMNIPAQUE IOHEXOL 350 MG/ML SOLN COMPARISON:  01/07/2020 FINDINGS: Cardiovascular: Pulmonary arteries are well opacified the contrast bolus. There is no acute pulmonary embolus. Heart size is normal. No pericardial effusion. No significant atherosclerotic calcifications of the thoracic aorta. Mediastinum/Nodes: The visualized portion of the thyroid gland has a normal appearance. Esophagus is normal. No significant mediastinal, hilar, or axillary adenopathy. Lungs/Pleura: No consolidations or pleural effusions. No suspicious pulmonary nodules. Airways are patent. Upper Abdomen: Chronic infarction of the spleen. Otherwise UPPER abdomen is unremarkable. Musculoskeletal: 3.1 centimeter low-attenuation adjacent to T8-9, stable in appearance and consistent with extra medullary hematopoiesis. Chronic diffuse osseous changes consistent with sickle cell disease. Partially calcified mass within the RIGHT breast measures 2.6 centimeters, similar in appearance to prior study from 04/19/2019. Review of the MIP images confirms the above findings. IMPRESSION: 1. No evidence for  acute pulmonary embolus. 2. No consolidations or pleural effusions. 3. RIGHT breast mass measures 2.6 centimeters. Recommend further characterization with bilateral diagnostic mammogram and RIGHT breast ultrasound when possible. Electronically Signed   By: Nolon Nations M.D.   On: 03/17/2021 11:27   DG Chest Port 1 View  Result Date: 03/17/2021 CLINICAL DATA:  Chest pain and sickle cell crisis. EXAM: PORTABLE CHEST 1 VIEW COMPARISON:  02/17/2021 FINDINGS: RIGHT-sided Port-A-Cath  tip overlies the level of superior vena cava. LEFT-sided PowerPort tip overlies the LOWER superior vena cava-UPPER RIGHT atrium. Heart size is within normal limits. Faint interstitial opacities throughout the lungs, similar to prior. Minimal atelectasis or scarring at the RIGHT lung base. No consolidations. No pleural effusions. IMPRESSION: RIGHT LOWER lobe atelectasis. Stable minimal interstitial prominence. Electronically Signed   By: Nolon Nations M.D.   On: 03/17/2021 09:24     Assessment & Plan:  Principal Problem:   Sickle cell pain crisis ([place]) Active Problems:   Leukocytosis   Asthma   Hx of pulmonary embolism (HCC)   Anxiety and depression   Chronic pain syndrome  Sickle cell disease with pain crisis: Admit patient to [place]. Reduce IV fluids to D5 0.45% saline at 75 mL/h IV Toradol 15 mg every 6 hours Initiate IV Dilaudid PCA with settings of 0.5 mg, 10-minute lockout, and 3 mg/h.  Of note, patient is opiate tolerant. Monitor vital signs very closely, reevaluate pain scale regularly, and supplemental oxygen as needed.  Leukocytosis: WBC 17.1.  COVID-19 test pending.  Patient is afebrile without any signs of acute infection or inflammation.  Continue to monitor closely.  Labs in AM.  Anemia of chronic disease: Hemoglobin 7.1, appears to be consistent with patient's baseline.  There is no clinical reason for blood transfusion at this time.  Continue to follow closely. Continue folic acid 1 mg daily Patient has a history of iron overload, continue Jadenu   Chronic pain syndrome: Hold home Dilaudid, use PCA Dilaudid instead.  History of PE: Continue Xarelto  History of mild intermittent asthma: Stable.  Continue home meds as needed.    DVT Prophylaxis: Xarelto and SCDs  AM Labs Ordered, also please review Full Orders  Family Communication: Admission, patient's condition and plan of care including tests being ordered have been discussed with the patient who indicate  understanding and agree with the plan and Code Status.  Code Status: Full Code  Consults called: None    Admission status: Inpatient    Time spent in minutes : 30 minutes  Center, MSN, FNP-C Patient [facility] [address] [place], [place] [address] [phone]  03/17/2021 at 12:12 PM

## 2021-03-18 DIAGNOSIS — Z7901 Long term (current) use of anticoagulants: Secondary | ICD-10-CM | POA: Diagnosis not present

## 2021-03-18 DIAGNOSIS — G894 Chronic pain syndrome: Secondary | ICD-10-CM | POA: Diagnosis present

## 2021-03-18 DIAGNOSIS — J4520 Mild intermittent asthma, uncomplicated: Secondary | ICD-10-CM | POA: Diagnosis present

## 2021-03-18 DIAGNOSIS — Z83438 Family history of other disorder of lipoprotein metabolism and other lipidemia: Secondary | ICD-10-CM | POA: Diagnosis not present

## 2021-03-18 DIAGNOSIS — Z841 Family history of disorders of kidney and ureter: Secondary | ICD-10-CM | POA: Diagnosis not present

## 2021-03-18 DIAGNOSIS — Z8249 Family history of ischemic heart disease and other diseases of the circulatory system: Secondary | ICD-10-CM | POA: Diagnosis not present

## 2021-03-18 DIAGNOSIS — F1120 Opioid dependence, uncomplicated: Secondary | ICD-10-CM | POA: Diagnosis present

## 2021-03-18 DIAGNOSIS — Z9049 Acquired absence of other specified parts of digestive tract: Secondary | ICD-10-CM | POA: Diagnosis not present

## 2021-03-18 DIAGNOSIS — Z20822 Contact with and (suspected) exposure to covid-19: Secondary | ICD-10-CM | POA: Diagnosis present

## 2021-03-18 DIAGNOSIS — D5700 Hb-SS disease with crisis, unspecified: Secondary | ICD-10-CM | POA: Diagnosis present

## 2021-03-18 DIAGNOSIS — Z79899 Other long term (current) drug therapy: Secondary | ICD-10-CM | POA: Diagnosis not present

## 2021-03-18 DIAGNOSIS — J9811 Atelectasis: Secondary | ICD-10-CM | POA: Diagnosis present

## 2021-03-18 DIAGNOSIS — Z86711 Personal history of pulmonary embolism: Secondary | ICD-10-CM | POA: Diagnosis not present

## 2021-03-18 DIAGNOSIS — F32A Depression, unspecified: Secondary | ICD-10-CM | POA: Diagnosis present

## 2021-03-18 DIAGNOSIS — Z888 Allergy status to other drugs, medicaments and biological substances status: Secondary | ICD-10-CM | POA: Diagnosis not present

## 2021-03-18 DIAGNOSIS — F419 Anxiety disorder, unspecified: Secondary | ICD-10-CM | POA: Diagnosis present

## 2021-03-18 DIAGNOSIS — Z833 Family history of diabetes mellitus: Secondary | ICD-10-CM | POA: Diagnosis not present

## 2021-03-18 DIAGNOSIS — D72829 Elevated white blood cell count, unspecified: Secondary | ICD-10-CM | POA: Diagnosis present

## 2021-03-18 LAB — CBC
HCT: 18.5 % — ABNORMAL LOW (ref 36.0–46.0)
Hemoglobin: 6.3 g/dL — CL (ref 12.0–15.0)
MCH: 30 pg (ref 26.0–34.0)
MCHC: 34.1 g/dL (ref 30.0–36.0)
MCV: 88.1 fL (ref 80.0–100.0)
Platelets: 493 10*3/uL — ABNORMAL HIGH (ref 150–400)
RBC: 2.1 MIL/uL — ABNORMAL LOW (ref 3.87–5.11)
RDW: 25.1 % — ABNORMAL HIGH (ref 11.5–15.5)
WBC: 15.2 10*3/uL — ABNORMAL HIGH (ref 4.0–10.5)
nRBC: 3.3 % — ABNORMAL HIGH (ref 0.0–0.2)

## 2021-03-18 MED ORDER — CHLORHEXIDINE GLUCONATE CLOTH 2 % EX PADS
6.0000 | MEDICATED_PAD | Freq: Every day | CUTANEOUS | Status: DC
  Administered 2021-03-18 – 2021-03-21 (×4): 6 via TOPICAL

## 2021-03-18 MED ORDER — HYDROXYZINE HCL 25 MG PO TABS: 25.0000 mg | ORAL_TABLET | Freq: Three times a day (TID) | ORAL | Status: DC | PRN

## 2021-03-18 MED ORDER — FLUTICASONE PROPIONATE 50 MCG/ACT NA SUSP
2.0000 | Freq: Every day | NASAL | Status: DC
  Administered 2021-03-18 – 2021-03-20 (×4): 2 via NASAL
  Filled 2021-03-18: qty 16

## 2021-03-18 NOTE — Progress Notes (Signed)
Subjective: Molly Gutierrez is a 40 year old female with medical history significant disease, chronic pain syndrome, opiate dependence and tolerance, history of mild intermittent asthma, And history of PE on Xarelto was admitted for sickle cell pain crisis. Today, patient's hemoglobin is 6.6, which is slightly below her baseline.  Patient denies any shortness of breath, dizziness, headache, urinary symptoms, nausea, vomiting, or diarrhea.  Patient is afebrile and oxygen saturation is 98% on RA.  Objective:  Vital signs in last 24 hours:  Vitals:   03/18/21 0713 03/18/21 0803 03/18/21 0930 03/18/21 1144  BP:   97/64   Pulse:   86   Resp: 16  14 16   Temp:   98.7 F (37.1 C)   TempSrc:   Oral   SpO2: 93% 92% (!) 89% 94%  Weight:      Height:        Intake/Output from previous day:   Intake/Output Summary (Last 24 hours) at 03/18/2021 1313 Last data filed at 03/18/2021 0931 Gross per 24 hour  Intake 1311.73 ml  Output --  Net 1311.73 ml    Physical Exam: General: Alert, awake, oriented x3, in no acute distress.  HEENT: [place]/AT PEERL, EOMI Neck: Trachea midline,  no masses, no thyromegal,y no JVD, no carotid bruit OROPHARYNX:  Moist, No exudate/ erythema/lesions.  Heart: Regular rate and rhythm, without murmurs, rubs, gallops, PMI non-displaced, no heaves or thrills on palpation.  Lungs: Clear to auscultation, no wheezing or rhonchi noted. No increased vocal fremitus resonant to percussion  Abdomen: Soft, nontender, nondistended, positive bowel sounds, no masses no hepatosplenomegaly noted..  Neuro: No focal neurological deficits noted cranial nerves II through XII grossly intact. DTRs 2+ bilaterally upper and lower extremities. Strength 5 out of 5 in bilateral upper and lower extremities. Musculoskeletal: No warm swelling or erythema around joints, no spinal tenderness noted. Psychiatric: Patient alert and oriented x3, good insight and cognition, good recent to remote  recall. Lymph node survey: No cervical axillary or inguinal lymphadenopathy noted.  Lab Results:  Basic Metabolic Panel:    Component Value Date/Time   NA 138 03/17/2021 0845   NA 137 08/02/2020 1538   K 3.1 (L) 03/17/2021 0845   CL 105 03/17/2021 0845   CO2 22 03/17/2021 0845   BUN 12 03/17/2021 0845   BUN 7 08/02/2020 1538   CREATININE 0.57 03/17/2021 0845   GLUCOSE 96 03/17/2021 0845   CALCIUM 8.9 03/17/2021 0845   CBC:    Component Value Date/Time   WBC 15.2 (H) 03/18/2021 0905   HGB 6.3 (LL) 03/18/2021 0905   HGB 7.8 (L) 08/02/2020 1538   HCT 18.5 (L) 03/18/2021 0905   HCT 22.3 (L) 08/02/2020 1538   PLT 493 (H) 03/18/2021 0905   PLT 560 (H) 08/02/2020 1538   MCV 88.1 03/18/2021 0905   MCV 92 08/02/2020 1538   NEUTROABS 10.7 (H) 03/17/2021 0845   NEUTROABS 9.5 (H) 08/02/2020 1538   LYMPHSABS 3.4 03/17/2021 0845   LYMPHSABS 3.0 08/02/2020 1538   MONOABS 2.5 (H) 03/17/2021 0845   EOSABS 0.2 03/17/2021 0845   EOSABS 0.4 08/02/2020 1538   BASOSABS 0.1 03/17/2021 0845   BASOSABS 0.1 08/02/2020 1538    Recent Results (from the past 240 hour(s))  Resp Panel by RT-PCR (Flu A&B, Covid) Nasopharyngeal Swab     Status: None   Collection Time: 03/17/21  3:51 PM   Specimen: Nasopharyngeal Swab; Nasopharyngeal(NP) swabs in vial transport medium  Result Value Ref Range Status   SARS Coronavirus 2 by RT  PCR NEGATIVE NEGATIVE Final    Comment: (NOTE) SARS-CoV-2 target nucleic acids are NOT DETECTED.  The SARS-CoV-2 RNA is generally detectable in upper respiratory specimens during the acute phase of infection. The lowest concentration of SARS-CoV-2 viral copies this assay can detect is 138 copies/mL. A negative result does not preclude SARS-Cov-2 infection and should not be used as the sole basis for treatment or other patient management decisions. A negative result may occur with  improper specimen collection/handling, submission of specimen other than nasopharyngeal  swab, presence of viral mutation(s) within the areas targeted by this assay, and inadequate number of viral copies(<138 copies/mL). A negative result must be combined with clinical observations, patient history, and epidemiological information. The expected result is Negative.  Fact Sheet for Patients:  EntrepreneurPulse.com.au  Fact Sheet for Healthcare Providers:  IncredibleEmployment.be  This test is no t yet approved or cleared by the [place] FDA and  has been authorized for detection and/or diagnosis of SARS-CoV-2 by FDA under an Emergency Use Authorization (EUA). This EUA will remain  in effect (meaning this test can be used) for the duration of the COVID-19 declaration under Section 564(b)(1) of the Act, 21 U.S.C.section 360bbb-3(b)(1), unless the authorization is terminated  or revoked sooner.       Influenza A by PCR NEGATIVE NEGATIVE Final   Influenza B by PCR NEGATIVE NEGATIVE Final    Comment: (NOTE) The Xpert Xpress SARS-CoV-2/FLU/RSV plus assay is intended as an aid in the diagnosis of influenza from Nasopharyngeal swab specimens and should not be used as a sole basis for treatment. Nasal washings and aspirates are unacceptable for Xpert Xpress SARS-CoV-2/FLU/RSV testing.  Fact Sheet for Patients: EntrepreneurPulse.com.au  Fact Sheet for Healthcare Providers: IncredibleEmployment.be  This test is not yet approved or cleared by the [place] FDA and has been authorized for detection and/or diagnosis of SARS-CoV-2 by FDA under an Emergency Use Authorization (EUA). This EUA will remain in effect (meaning this test can be used) for the duration of the COVID-19 declaration under Section 564(b)(1) of the Act, 21 U.S.C. section 360bbb-3(b)(1), unless the authorization is terminated or revoked.  Performed at [facility], [place] [address] [place], [place] [address]      Studies/Results: CT Angio Chest PE W/Cm &/Or Wo Cm  Result Date: 03/17/2021 CLINICAL DATA:  Pulmonary embolus suspected. Chest pain since Thursday. Sickle cell crisis. EXAM: CT ANGIOGRAPHY CHEST WITH CONTRAST TECHNIQUE: Multidetector CT imaging of the chest was performed using the standard protocol during bolus administration of intravenous contrast. Multiplanar CT image reconstructions and MIPs were obtained to evaluate the vascular anatomy. CONTRAST:  67mL OMNIPAQUE IOHEXOL 350 MG/ML SOLN COMPARISON:  01/07/2020 FINDINGS: Cardiovascular: Pulmonary arteries are well opacified the contrast bolus. There is no acute pulmonary embolus. Heart size is normal. No pericardial effusion. No significant atherosclerotic calcifications of the thoracic aorta. Mediastinum/Nodes: The visualized portion of the thyroid gland has a normal appearance. Esophagus is normal. No significant mediastinal, hilar, or axillary adenopathy. Lungs/Pleura: No consolidations or pleural effusions. No suspicious pulmonary nodules. Airways are patent. Upper Abdomen: Chronic infarction of the spleen. Otherwise UPPER abdomen is unremarkable. Musculoskeletal: 3.1 centimeter low-attenuation adjacent to T8-9, stable in appearance and consistent with extra medullary hematopoiesis. Chronic diffuse osseous changes consistent with sickle cell disease. Partially calcified mass within the RIGHT breast measures 2.6 centimeters, similar in appearance to prior study from 04/19/2019. Review of the MIP images confirms the above findings. IMPRESSION: 1. No evidence for acute pulmonary embolus. 2. No consolidations or pleural  effusions. 3. RIGHT breast mass measures 2.6 centimeters. Recommend further characterization with bilateral diagnostic mammogram and RIGHT breast ultrasound when possible. Electronically Signed   By: Nolon Nations M.D.   On: 03/17/2021 11:27   DG Chest Port 1 View  Result Date: 03/17/2021 CLINICAL DATA:  Chest pain and sickle cell  crisis. EXAM: PORTABLE CHEST 1 VIEW COMPARISON:  02/17/2021 FINDINGS: RIGHT-sided Port-A-Cath tip overlies the level of superior vena cava. LEFT-sided PowerPort tip overlies the LOWER superior vena cava-UPPER RIGHT atrium. Heart size is within normal limits. Faint interstitial opacities throughout the lungs, similar to prior. Minimal atelectasis or scarring at the RIGHT lung base. No consolidations. No pleural effusions. IMPRESSION: RIGHT LOWER lobe atelectasis. Stable minimal interstitial prominence. Electronically Signed   By: Nolon Nations M.D.   On: 03/17/2021 09:24    Medications: Scheduled Meds:  Chlorhexidine Gluconate Cloth  6 each Topical Daily   cholecalciferol  1,000 Units Oral Daily   Deferasirox  1,080 mg Oral QAC breakfast   fluticasone  2 spray Each Nare Daily   folic acid  1 mg Oral Daily   HYDROmorphone   Intravenous Q4H   ketorolac  15 mg Intravenous Q6H   mirtazapine  45 mg Oral QHS   mometasone-formoterol  2 puff Inhalation BID   rivaroxaban  20 mg Oral Q supper   senna-docusate  1 tablet Oral BID   vitamin B-12  1,000 mcg Oral Daily   voxelotor  1,500 mg Oral QHS   Continuous Infusions: PRN Meds:.albuterol, diphenhydrAMINE **OR** [DISCONTINUED] diphenhydrAMINE, HYDROmorphone (DILAUDID) injection, naloxone **AND** sodium chloride flush, ondansetron (ZOFRAN) IV, polyethylene glycol  Consultants: None  Procedures: None  Antibiotics: None  Assessment/Plan: Principal Problem:   Sickle cell pain crisis ([place]) Active Problems:   Leukocytosis   Asthma   Hx of pulmonary embolism (HCC)   Anxiety and depression   Chronic pain syndrome Sickle cell disease with pain crisis: Continue IV Dilaudid PCA, weaning Toradol 15 mg IV every 6 hours Dilaudid 4 mg by mouth every 6 hours as needed for severe breakthrough pain Monitor vital signs very closely, reevaluate pain scale regularly, and supplemental oxygen as needed Discharge planned for 03/20/2021  Chronic pain  syndrome: Continue home medications  Anemia of chronic disease: Today, patient's hemoglobin is 6.6, which is increased from previous.  Probable hemodilution.  Continue to monitor closely.  Patient is typically not transfused unless hemoglobin is less than 6 due to history of hemochromatosis. Continue Jadenu.  History of mild intermittent asthma: Continue home medications as needed  History of PE: Continue Xarelto  Code Status: Full Code Family Communication: N/A Disposition Plan: Not yet ready for discharge  [place], MSN, FNP-C Patient [facility] [place], [place] [address] [phone]  If 7PM-7AM, please contact night-coverage.  03/18/2021, 1:13 PM  LOS: 0 days

## 2021-03-19 DIAGNOSIS — D5700 Hb-SS disease with crisis, unspecified: Secondary | ICD-10-CM | POA: Diagnosis not present

## 2021-03-19 LAB — CBC
HCT: 19 % — ABNORMAL LOW (ref 36.0–46.0)
Hemoglobin: 6.6 g/dL — CL (ref 12.0–15.0)
MCH: 30.3 pg (ref 26.0–34.0)
MCHC: 34.7 g/dL (ref 30.0–36.0)
MCV: 87.2 fL (ref 80.0–100.0)
Platelets: 478 10*3/uL — ABNORMAL HIGH (ref 150–400)
RBC: 2.18 MIL/uL — ABNORMAL LOW (ref 3.87–5.11)
RDW: 25.2 % — ABNORMAL HIGH (ref 11.5–15.5)
WBC: 17.1 10*3/uL — ABNORMAL HIGH (ref 4.0–10.5)
nRBC: 3 % — ABNORMAL HIGH (ref 0.0–0.2)

## 2021-03-20 DIAGNOSIS — D5700 Hb-SS disease with crisis, unspecified: Secondary | ICD-10-CM | POA: Diagnosis not present

## 2021-03-20 LAB — CBC
HCT: 18.3 % — ABNORMAL LOW (ref 36.0–46.0)
Hemoglobin: 6.4 g/dL — CL (ref 12.0–15.0)
MCH: 30.3 pg (ref 26.0–34.0)
MCHC: 35 g/dL (ref 30.0–36.0)
MCV: 86.7 fL (ref 80.0–100.0)
Platelets: 450 10*3/uL — ABNORMAL HIGH (ref 150–400)
RBC: 2.11 MIL/uL — ABNORMAL LOW (ref 3.87–5.11)
RDW: 25.2 % — ABNORMAL HIGH (ref 11.5–15.5)
WBC: 14.6 10*3/uL — ABNORMAL HIGH (ref 4.0–10.5)
nRBC: 3.3 % — ABNORMAL HIGH (ref 0.0–0.2)

## 2021-03-20 LAB — PREPARE RBC (CROSSMATCH)

## 2021-03-20 MED ORDER — SODIUM CHLORIDE 0.9% IV SOLUTION: Freq: Once | INTRAVENOUS | Status: AC

## 2021-03-20 MED ORDER — HYDROMORPHONE HCL 1 MG/ML IJ SOLN
1.0000 mg | Freq: Four times a day (QID) | INTRAMUSCULAR | Status: DC | PRN
  Administered 2021-03-20 – 2021-03-21 (×4): 1 mg via INTRAVENOUS
  Filled 2021-03-20 (×4): qty 1

## 2021-03-20 MED ORDER — DIPHENHYDRAMINE HCL 50 MG/ML IJ SOLN
25.0000 mg | Freq: Once | INTRAMUSCULAR | Status: AC
Start: 2021-03-20 — End: 2021-03-20
  Administered 2021-03-20: 25 mg via INTRAVENOUS
  Filled 2021-03-20: qty 1

## 2021-03-20 MED ORDER — DIPHENHYDRAMINE HCL 25 MG PO CAPS
25.0000 mg | ORAL_CAPSULE | Freq: Once | ORAL | Status: DC
  Filled 2021-03-20: qty 1

## 2021-03-20 NOTE — Progress Notes (Signed)
Date and time results received: 03/20/21 0812 (use smartphrase ".now" to insert current time)  Test: hgb Critical Value: 6.4  Name of Provider Notified: Hollis [place] ,NP  Orders Received? Or Actions Taken?:YES

## 2021-03-20 NOTE — Progress Notes (Signed)
Subjective: Molly Gutierrez is a 40 year old female with a medical history significant for sickle cell disease, chronic pain syndrome, opiate dependence and tolerance, history of PE on Xarelto, history of mild intermittent asthma, and history of anemia of chronic disease was admitted for sickle cell pain crisis. Today, patient's hemoglobin is 6.3 g/dL, which is below her baseline.  Patient denies any dizziness, headache, shortness of breath, urinary symptoms, nausea, vomiting, or diarrhea. She continues to complain of pain primarily to central chest, low back, and lower extremities.  She rates her pain as 7/10.  Objective:  Vital signs in last 24 hours:  Vitals:   03/21/21 0220 03/21/21 0527 03/21/21 0818 03/21/21 1029  BP: (!) 95/59 104/61  108/71  Pulse: 87 71 72 76  Resp: 16 15 16 16   Temp: 99.3 F (37.4 C) 99.1 F (37.3 C)  99.2 F (37.3 C)  TempSrc: Oral Oral  Oral  SpO2: 93% 98% 98% 95%  Weight:      Height:        Intake/Output from previous day:  No intake or output data in the 24 hours ending 03/22/21 1612  Physical Exam: General: Alert, awake, oriented x3, in no acute distress.  HEENT: [place]/AT PEERL, EOMI Neck: Trachea midline,  no masses, no thyromegal,y no JVD, no carotid bruit OROPHARYNX:  Moist, No exudate/ erythema/lesions.  Heart: Regular rate and rhythm, without murmurs, rubs, gallops, PMI non-displaced, no heaves or thrills on palpation.  Lungs: Clear to auscultation, no wheezing or rhonchi noted. No increased vocal fremitus resonant to percussion  Abdomen: Soft, nontender, nondistended, positive bowel sounds, no masses no hepatosplenomegaly noted..  Neuro: No focal neurological deficits noted cranial nerves II through XII grossly intact. DTRs 2+ bilaterally upper and lower extremities. Strength 5 out of 5 in bilateral upper and lower extremities. Musculoskeletal: No warm swelling or erythema around joints, no spinal tenderness noted. Psychiatric: Patient  alert and oriented x3, good insight and cognition, good recent to remote recall. Lymph node survey: No cervical axillary or inguinal lymphadenopathy noted.  Lab Results:  Basic Metabolic Panel:    Component Value Date/Time   NA 138 03/17/2021 0845   NA 137 08/02/2020 1538   K 3.1 (L) 03/17/2021 0845   CL 105 03/17/2021 0845   CO2 22 03/17/2021 0845   BUN 12 03/17/2021 0845   BUN 7 08/02/2020 1538   CREATININE 0.57 03/17/2021 0845   GLUCOSE 96 03/17/2021 0845   CALCIUM 8.9 03/17/2021 0845   CBC:    Component Value Date/Time   WBC 12.3 (H) 03/21/2021 1040   HGB 8.6 (L) 03/21/2021 1040   HGB 7.8 (L) 08/02/2020 1538   HCT 24.2 (L) 03/21/2021 1040   HCT 22.3 (L) 08/02/2020 1538   PLT 480 (H) 03/21/2021 1040   PLT 560 (H) 08/02/2020 1538   MCV 89.0 03/21/2021 1040   MCV 92 08/02/2020 1538   NEUTROABS 10.7 (H) 03/17/2021 0845   NEUTROABS 9.5 (H) 08/02/2020 1538   LYMPHSABS 3.4 03/17/2021 0845   LYMPHSABS 3.0 08/02/2020 1538   MONOABS 2.5 (H) 03/17/2021 0845   EOSABS 0.2 03/17/2021 0845   EOSABS 0.4 08/02/2020 1538   BASOSABS 0.1 03/17/2021 0845   BASOSABS 0.1 08/02/2020 1538    Recent Results (from the past 240 hour(s))  Resp Panel by RT-PCR (Flu A&B, Covid) Nasopharyngeal Swab     Status: None   Collection Time: 03/17/21  3:51 PM   Specimen: Nasopharyngeal Swab; Nasopharyngeal(NP) swabs in vial transport medium  Result Value Ref Range Status  SARS Coronavirus 2 by RT PCR NEGATIVE NEGATIVE Final    Comment: (NOTE) SARS-CoV-2 target nucleic acids are NOT DETECTED.  The SARS-CoV-2 RNA is generally detectable in upper respiratory specimens during the acute phase of infection. The lowest concentration of SARS-CoV-2 viral copies this assay can detect is 138 copies/mL. A negative result does not preclude SARS-Cov-2 infection and should not be used as the sole basis for treatment or other patient management decisions. A negative result may occur with  improper specimen  collection/handling, submission of specimen other than nasopharyngeal swab, presence of viral mutation(s) within the areas targeted by this assay, and inadequate number of viral copies(<138 copies/mL). A negative result must be combined with clinical observations, patient history, and epidemiological information. The expected result is Negative.  Fact Sheet for Patients:  EntrepreneurPulse.com.au  Fact Sheet for Healthcare Providers:  IncredibleEmployment.be  This test is no t yet approved or cleared by the [place] FDA and  has been authorized for detection and/or diagnosis of SARS-CoV-2 by FDA under an Emergency Use Authorization (EUA). This EUA will remain  in effect (meaning this test can be used) for the duration of the COVID-19 declaration under Section 564(b)(1) of the Act, 21 U.S.C.section 360bbb-3(b)(1), unless the authorization is terminated  or revoked sooner.       Influenza A by PCR NEGATIVE NEGATIVE Final   Influenza B by PCR NEGATIVE NEGATIVE Final    Comment: (NOTE) The Xpert Xpress SARS-CoV-2/FLU/RSV plus assay is intended as an aid in the diagnosis of influenza from Nasopharyngeal swab specimens and should not be used as a sole basis for treatment. Nasal washings and aspirates are unacceptable for Xpert Xpress SARS-CoV-2/FLU/RSV testing.  Fact Sheet for Patients: EntrepreneurPulse.com.au  Fact Sheet for Healthcare Providers: IncredibleEmployment.be  This test is not yet approved or cleared by the [place] FDA and has been authorized for detection and/or diagnosis of SARS-CoV-2 by FDA under an Emergency Use Authorization (EUA). This EUA will remain in effect (meaning this test can be used) for the duration of the COVID-19 declaration under Section 564(b)(1) of the Act, 21 U.S.C. section 360bbb-3(b)(1), unless the authorization is terminated or revoked.  Performed at [facility], [place] [address], [place], [place] [address]     Studies/Results: No results found.  Medications: Scheduled Meds: Continuous Infusions: PRN Meds:.  Consultants: none  Procedures: none  Antibiotics: none  Assessment/Plan: Principal Problem:   Sickle cell pain crisis (HCC) Active Problems:   Leukocytosis   Asthma   Hx of pulmonary embolism (HCC)   Anxiety and depression   Chronic pain syndrome   Sickle cell disease with pain crisis: Continue IV Dilaudid PCA Toradol 15 mg IV every 6 hours Dilaudid 4 mg by mouth every 6 hours as needed for severe breakthrough pain Monitor vital signs very closely, reevaluate pain scale regularly, and supplemental oxygen as needed  Chronic pain syndrome: Continue home medications  Anemia of chronic disease: Today, patient's hemoglobin is 6.3, which is below her baseline.  Transfuse 1 unit PRBCs today.  Follow labs in AM.  History of PE: Continue Xarelto  History of mild intermittent asthma: Continue home medications as needed    Code Status: Full Code Family Communication: N/A Disposition Plan: Not yet ready for discharge  [place], MSN, FNP-C Patient [address] [place], [place] [address] [phone]  If 5PM-8AM, please contact night-coverage.  03/22/2021, 4:12 PM  LOS: 3 days

## 2021-03-21 DIAGNOSIS — D5700 Hb-SS disease with crisis, unspecified: Secondary | ICD-10-CM | POA: Diagnosis not present

## 2021-03-21 LAB — BPAM RBC
Blood Product Expiration Date: 202211102359
ISSUE DATE / TIME: 202210041614
Unit Type and Rh: 9500

## 2021-03-21 LAB — TYPE AND SCREEN
ABO/RH(D): A POS
Antibody Screen: NEGATIVE
Unit division: 0

## 2021-03-21 LAB — CBC
HCT: 24.2 % — ABNORMAL LOW (ref 36.0–46.0)
Hemoglobin: 8.6 g/dL — ABNORMAL LOW (ref 12.0–15.0)
MCH: 31.6 pg (ref 26.0–34.0)
MCHC: 35.5 g/dL (ref 30.0–36.0)
MCV: 89 fL (ref 80.0–100.0)
Platelets: 480 10*3/uL — ABNORMAL HIGH (ref 150–400)
RBC: 2.72 MIL/uL — ABNORMAL LOW (ref 3.87–5.11)
RDW: 24.3 % — ABNORMAL HIGH (ref 11.5–15.5)
WBC: 12.3 10*3/uL — ABNORMAL HIGH (ref 4.0–10.5)
nRBC: 2.6 % — ABNORMAL HIGH (ref 0.0–0.2)

## 2021-03-21 MED ORDER — HEPARIN SOD (PORK) LOCK FLUSH 100 UNIT/ML IV SOLN: 500.0000 [IU] | INTRAVENOUS | Status: DC | PRN

## 2021-03-21 MED ORDER — SODIUM CHLORIDE 0.9% FLUSH
10.0000 mL | INTRAVENOUS | Status: DC | PRN
Start: 2021-03-21 — End: 2021-03-21

## 2021-03-21 NOTE — Progress Notes (Signed)
Patient instructed on AVS- discharge instructions, Follow up appointment, and medications.  All questions answered at this time.  PAC was deaccessed and band-aid placed.  Patient tele removed and CCMD made aware of DC.  Patient dressed self and packed all belongings.  Patient insisted that she walk self out instead of taking a wheelchair.  No s/s of distress noted at this time.

## 2021-03-21 NOTE — Plan of Care (Signed)

## 2021-03-21 NOTE — Discharge Summary (Signed)
Physician Discharge Summary  [facility] GGE:[id number] DOB: 11/11/1980 DOA: 03/17/2021  PCP: Vevelyn Francois, NP  Admit date: 03/17/2021  Discharge date: 03/21/2021  Discharge Diagnoses:  Principal Problem:   Sickle cell pain crisis ([name]) Active Problems:   Leukocytosis   Asthma   Hx of pulmonary embolism (HCC)   Anxiety and depression   Chronic pain syndrome   Discharge Condition: Stable  Disposition:   Follow-up Information     Vevelyn Francois, NP Follow up in 1 week(s).   Specialty: Adult Health Nurse Practitioner Contact information: [address] [place] [address] [phone]                Pt is discharged home in good condition and is to follow up with Vevelyn Francois, NP this week to have labs evaluated. Molly Gutierrez is instructed to increase activity slowly and balance with rest for the next few days, and use prescribed medication to complete treatment of pain  Diet: Regular Wt Readings from Last 3 Encounters:  03/17/21 57.6 kg  02/20/21 56.9 kg  01/03/21 56.7 kg    History of present illness:  Molly Gutierrez  is a 40 y.o. female with a medical history significant for sickle cell disease, chronic pain syndrome, opiate dependence and tolerance, history of PE on Xarelto, history of mild intermittent asthma, and history of anemia of chronic disease presents to the emergency department with pain primarily to right chest that is consistent with her previous sickle cell pain crisis.  Patient was in usual state of health until 2 days prior.  She attributes current pain crisis to changes in weather.  Patient rates her pain at 8/10 characterized as constant, aching, and throbbing.  She denies headache, shortness of breath, dizziness.  No fever, no chills, no urinary symptoms, nausea, vomiting, or diarrhea.  No sick contacts, no recent travel, or exposure to COVID-19.  Of note, patient has been fully vaccinated against COVID-19.  ER  course: While in ER, vital signs showed: BP 103/70, heart rate 81, respirations 12, oxygen saturation 92% on RA.  Complete blood c platelets 553,000.  [place] shows: WBC 17.1, hemoglobin 7.1 g/dL, and platelets 553,000.  Complete metabolic panel shows [place] patient has a robust reticulocytosis.  hCG negative.  ium 3.1 and total bilirubin 3.7, otherwise unremarkable.  COVID-19 pending.  Chest x-ray shows right lower lobe atelectasis.  Stable minimal interstitial prominence.  CT angiogram of chest shows no evidence for acute pulmonary embolus, no consolidations or pleural effusions.  Right breast measures 2.6 cm.  Recommend further characterization.  This is not a new finding.  Patient's pain persists despite Dilaudid 2 mg x 3 doses, IV Toradol, IV fluids, and Benadryl.  Patient will be admitted in observation for further control of sickle cell related pain  Hospital Course:  Sickle cell disease with pain crisis:  Patient was admitted for sickle cell pain crisis and managed appropriately with IVF, IV Dilaudid via PCA and IV Toradol, as well as other adjunct therapies per sickle cell pain management protocols.  IV Dilaudid PCA weaned appropriately.  Pain intensity decreased to 5/10.  Patient advised to resume home medications and follow-up with PCP for medication management.  Anemia of chronic disease: Patient's hemoglobin decreased to 6.3 g/dL, which is below her baseline.  Transfuse 1 unit PRBCs.  Prior to discharge , Hemoglobin slightly above baseline at 8.6 g/dL.  Patient will follow-up with PCP in 2 weeks to repeat CBC with differential.  Patient is alert,  oriented, and ambulating without assistance.  She will discharge home in a hemodynamically stable condition.   Molly Gutierrez will follow-up with PCP within 1 week of this discharge. Molly Gutierrez was counseled extensively about nonpharmacologic means of pain management, patient verbalized understanding and was appreciative of  the care received during this  admission.   We discussed the need for good hydration, monitoring of hydration status, avoidance of heat, cold, stress, and infection triggers. We discussed the need to be adherent with taking Hydrea and other home medications. Patient was reminded of the need to seek medical attention immediately if any symptom of bleeding, anemia, or infection occurs.  Discharge Exam: Vitals:   03/21/21 0818 03/21/21 1029  BP:  108/71  Pulse: 72 76  Resp: 16 16  Temp:  99.2 F (37.3 C)  SpO2: 98% 95%   Vitals:   03/21/21 0220 03/21/21 0527 03/21/21 0818 03/21/21 1029  BP: (!) 95/59 104/61  108/71  Pulse: 87 71 72 76  Resp: 16 15 16 16   Temp: 99.3 F (37.4 C) 99.1 F (37.3 C)  99.2 F (37.3 C)  TempSrc: Oral Oral  Oral  SpO2: 93% 98% 98% 95%  Weight:      Height:        General appearance : Awake, alert, not in any distress. Speech Clear. Not toxic looking HEENT: Atraumatic and Normocephalic, pupils equally reactive to light and accomodation Neck: Supple, no JVD. No cervical lymphadenopathy.  Chest: Good air entry bilaterally, no added sounds  CVS: S1 S2 regular, no murmurs.  Abdomen: Bowel sounds present, Non tender and not distended with no guarding, rigidity or rebound. Extremities: B/L Lower Ext shows no edema, both legs are warm to touch Neurology: Awake alert, and oriented X 3, CN II-XII intact, Non focal Skin: No Rash  Discharge Instructions  Discharge Instructions     Discharge patient   Complete by: As directed    Discharge disposition: 01-Home or Self Care   Discharge patient date: 03/21/2021      Allergies as of 03/21/2021       Reactions   Cefaclor Hives, Swelling   Hydroxyurea Palpitations, Other (See Comments)   Lowers "blood levels" and heart rate (causes HYPOtension); "it messes me up, it drops my levels and stuff"   Omeprazole Anaphylaxis, Other (See Comments)   Causes "sharp pains in the stomach"   Ketamine Palpitations, Other (See Comments)   "Pt states  she has had previous reaction to ketamine. States she becomes flushed, heart races, dizzy, and feels like she is going to pass out."        Medication List     TAKE these medications    albuterol 108 (90 Base) MCG/ACT inhaler Commonly known as: VENTOLIN HFA Inhale 2 puffs into the lungs 2 (two) times daily as needed for wheezing or shortness of breath.   celecoxib 200 MG capsule Commonly known as: CELEBREX Take 200 mg by mouth daily as needed for moderate pain or mild pain.   Deferasirox 360 MG Tabs Commonly known as: Jadenu Take 3 tablets (1,080 mg total) by mouth daily before breakfast. What changed: when to take this   diphenhydrAMINE 25 mg capsule Commonly known as: BENADRYL Take 25 mg by mouth 3 (three) times daily as needed for itching.   folic acid 1 MG tablet Commonly known as: FOLVITE Take 1 tablet (1 mg total) by mouth daily.   HYDROmorphone 4 MG tablet Commonly known as: DILAUDID Take 1 tablet (4 mg total) by mouth every 6 (  six) hours as needed for up to 15 days for moderate pain or severe pain.   mirtazapine 45 MG tablet Commonly known as: REMERON Take 1 tablet (45 mg total) by mouth at bedtime.   mometasone-formoterol 100-5 MCG/ACT Aero Commonly known as: DULERA Inhale 2 puffs into the lungs daily as needed for wheezing or shortness of breath.   omeprazole 20 MG capsule Commonly known as: PRILOSEC Take 1 capsule (20 mg total) by mouth 2 (two) times daily before a meal.   Oxbryta 500 MG Tabs tablet Generic drug: voxelotor Take 1,500 mg by mouth at bedtime.   polyethylene glycol 17 g packet Commonly known as: MIRALAX / GLYCOLAX Take 17 g by mouth daily as needed for mild constipation (MIX AND DRINK).   promethazine 25 MG tablet Commonly known as: PHENERGAN Take 0.5-1 tablets (12.5-25 mg total) by mouth every 6 (six) hours as needed for nausea or vomiting.   vitamin B-12 1000 MCG tablet Commonly known as: CYANOCOBALAMIN Take 1 tablet (1,000 mcg  total) by mouth daily.   Vitamin D3 25 MCG (1000 UT) Caps Take 1 capsule (1,000 Units total) by mouth daily.   Xarelto 20 MG Tabs tablet Generic drug: rivaroxaban Take 1 tablet (20 mg total) by mouth daily with supper.        The results of significant diagnostics from this hospitalization (including imaging, microbiology, ancillary and laboratory) are listed below for reference.    Significant Diagnostic Studies: CT Angio Chest PE W/Cm &/Or Wo Cm  Result Date: 03/17/2021 CLINICAL DATA:  Pulmonary embolus suspected. Chest pain since Thursday. Sickle cell crisis. EXAM: CT ANGIOGRAPHY CHEST WITH CONTRAST TECHNIQUE: Multidetector CT imaging of the chest was performed using the standard protocol during bolus administration of intravenous contrast. Multiplanar CT image reconstructions and MIPs were obtained to evaluate the vascular anatomy. CONTRAST:  13mL OMNIPAQUE IOHEXOL 350 MG/ML SOLN COMPARISON:  01/07/2020 FINDINGS: Cardiovascular: Pulmonary arteries are well opacified the contrast bolus. There is no acute pulmonary embolus. Heart size is normal. No pericardial effusion. No significant atherosclerotic calcifications of the thoracic aorta. Mediastinum/Nodes: The visualized portion of the thyroid gland has a normal appearance. Esophagus is normal. No significant mediastinal, hilar, or axillary adenopathy. Lungs/Pleura: No consolidations or pleural effusions. No suspicious pulmonary nodules. Airways are patent. Upper Abdomen: Chronic infarction of the spleen. Otherwise UPPER abdomen is unremarkable. Musculoskeletal: 3.1 centimeter low-attenuation adjacent to T8-9, stable in appearance and consistent with extra medullary hematopoiesis. Chronic diffuse osseous changes consistent with sickle cell disease. Partially calcified mass within the RIGHT breast measures 2.6 centimeters, similar in appearance to prior study from 04/19/2019. Review of the MIP images confirms the above findings. IMPRESSION: 1. No  evidence for acute pulmonary embolus. 2. No consolidations or pleural effusions. 3. RIGHT breast mass measures 2.6 centimeters. Recommend further characterization with bilateral diagnostic mammogram and RIGHT breast ultrasound when possible. Electronically Signed   By: Nolon Nations M.D.   On: 03/17/2021 11:27   DG Chest Port 1 View  Result Date: 03/17/2021 CLINICAL DATA:  Chest pain and sickle cell crisis. EXAM: PORTABLE CHEST 1 VIEW COMPARISON:  02/17/2021 FINDINGS: RIGHT-sided Port-A-Cath tip overlies the level of superior vena cava. LEFT-sided PowerPort tip overlies the LOWER superior vena cava-UPPER RIGHT atrium. Heart size is within normal limits. Faint interstitial opacities throughout the lungs, similar to prior. Minimal atelectasis or scarring at the RIGHT lung base. No consolidations. No pleural effusions. IMPRESSION: RIGHT LOWER lobe atelectasis. Stable minimal interstitial prominence. Electronically Signed   By: Nolon Nations M.D.  On: 03/17/2021 09:24    Microbiology: Recent Results (from the past 240 hour(s))  Resp Panel by RT-PCR (Flu A&B, Covid) Nasopharyngeal Swab     Status: None   Collection Time: 03/17/21  3:51 PM   Specimen: Nasopharyngeal Swab; Nasopharyngeal(NP) swabs in vial transport medium  Result Value Ref Range Status   SARS Coronavirus 2 by RT PCR NEGATIVE NEGATIVE Final    Comment: (NOTE) SARS-CoV-2 target nucleic acids are NOT DETECTED.  The SARS-CoV-2 RNA is generally detectable in upper respiratory specimens during the acute phase of infection. The lowest concentration of SARS-CoV-2 viral copies this assay can detect is 138 copies/mL. A negative result does not preclude SARS-Cov-2 infection and should not be used as the sole basis for treatment or other patient management decisions. A negative result may occur with  improper specimen collection/handling, submission of specimen other than nasopharyngeal swab, presence of viral mutation(s) within  the areas targeted by this assay, and inadequate number of viral copies(<138 copies/mL). A negative result must be combined with clinical observations, patient history, and epidemiological information. The expected result is Negative.  Fact Sheet for Patients:  EntrepreneurPulse.com.au  Fact Sheet for Healthcare Providers:  IncredibleEmployment.be  This test is no t yet approved or cleared by the [place] FDA and  has been authorized for detection and/or diagnosis of SARS-CoV-2 by FDA under an Emergency Use Authorization (EUA). This EUA will remain  in effect (meaning this test can be used) for the duration of the COVID-19 declaration under Section 564(b)(1) of the Act, 21 U.S.C.section 360bbb-3(b)(1), unless the authorization is terminated  or revoked sooner.       Influenza A by PCR NEGATIVE NEGATIVE Final   Influenza B by PCR NEGATIVE NEGATIVE Final    Comment: (NOTE) The Xpert Xpress SARS-CoV-2/FLU/RSV plus assay is intended as an aid in the diagnosis of influenza from Nasopharyngeal swab specimens and should not be used as a sole basis for treatment. Nasal washings and aspirates are unacceptable for Xpert Xpress SARS-CoV-2/FLU/RSV testing.  Fact Sheet for Patients: EntrepreneurPulse.com.au  Fact Sheet for Healthcare Providers: IncredibleEmployment.be  This test is not yet approved or cleared by the [place] FDA and has been authorized for detection and/or diagnosis of SARS-CoV-2 by FDA under an Emergency Use Authorization (EUA). This EUA will remain in effect (meaning this test can be used) for the duration of the COVID-19 declaration under Section 564(b)(1) of the Act, 21 U.S.C. section 360bbb-3(b)(1), unless the authorization is terminated or revoked.  Performed at [facility], [place] [address] [place], [place] [address]      Labs: Basic Metabolic Panel: Recent  Labs  Lab 03/17/21 0845  NA 138  K 3.1*  CL 105  CO2 22  GLUCOSE 96  BUN 12  CREATININE 0.57  CALCIUM 8.9   Liver Function Tests: Recent Labs  Lab 03/17/21 0845  AST 41  ALT 19  ALKPHOS 57  BILITOT 3.7*  PROT 7.8  ALBUMIN 4.4   No results for input(s): LIPASE, AMYLASE in the last 168 hours. No results for input(s): AMMONIA in the last 168 hours. CBC: Recent Labs  Lab 03/17/21 0845 03/18/21 0905 03/19/21 0459 03/20/21 0643 03/21/21 1040  WBC 17.1* 15.2* 17.1* 14.6* 12.3*  NEUTROABS 10.7*  --   --   --   --   HGB 7.1* 6.3* 6.6* 6.4* 8.6*  HCT 21.2* 18.5* 19.0* 18.3* 24.2*  MCV 89.1 88.1 87.2 86.7 89.0  PLT 553* 493* 478* 450* 480*   Cardiac Enzymes:  No results for input(s): CKTOTAL, CKMB, CKMBINDEX, TROPONINI in the last 168 hours. BNP: Invalid input(s): POCBNP CBG: No results for input(s): GLUCAP in the last 168 hours.  Time coordinating discharge: 30 minutes  Signed:  [name]  APRN, MSN, FNP-C [facility] [place], [place] [address] [phone]  [facility] 03/22/2021, 3:56 PM

## 2021-03-21 NOTE — Care Management Important Message (Signed)
Important Message  Patient Details IM Letter given to the Patient. Name: Molly Gutierrez MRN: [id number] Date of Birth: 1980/12/02   Medicare Important Message Given:  Yes     Kerin Salen 03/21/2021, 9:44 AM

## 2021-03-23 ENCOUNTER — Other Ambulatory Visit: Payer: Self-pay | Admitting: Nurse Practitioner

## 2021-03-23 ENCOUNTER — Telehealth: Payer: Self-pay

## 2021-03-23 MED ORDER — HYDROMORPHONE HCL 4 MG PO TABS: 4.0000 mg | ORAL_TABLET | Freq: Four times a day (QID) | ORAL | 0 refills | Status: AC | PRN

## 2021-03-23 NOTE — Telephone Encounter (Signed)
Transition Care Management Follow-up Telephone Call Date of discharge and from where: 03/21/2021  [name] How have you been since you were released from the hospital? " Doing well" Any questions or concerns? No  Items Reviewed: Did the pt receive and understand the discharge instructions provided? Yes  Medications obtained and verified? Yes  Other? No  Any new allergies since your discharge? No  Dietary orders reviewed? Yes Do you have support at home? Yes   Home Care and Equipment/Supplies: Were home health services ordered? not applicable If so, what is the name of the agency?  Has the agency set up a time to come to the patient's home?  Were any new equipment or medical supplies ordered?   What is the name of the medical supply agency?  Were you able to get the supplies/equipment?  Do you have any questions related to the use of the equipment or supplies?   Functional Questionnaire: (I = Independent and D = Dependent) ADLs: I  Bathing/Dressing- I  Meal Prep- I  Eating- I  Maintaining continence- I  Transferring/Ambulation- I  Managing Meds- I  Follow up appointments reviewed:  PCP Hospital f/u appt confirmed? Yes  Scheduled to see Dionisio David on 03/26/2021 @ [facility] f/u appt confirmed? No   Are transportation arrangements needed?  If their condition worsens, is the pt aware to call PCP or go to the Emergency Dept.? Yes Was the patient provided with contact information for the PCP's office or ED? Yes Was to pt encouraged to call back with questions or concerns? Yes Tomasa Rand, RN, BSN, CEN [facility] [employer] [phone]

## 2021-03-23 NOTE — Telephone Encounter (Signed)
Error, duplicate

## 2021-03-23 NOTE — Telephone Encounter (Signed)
Hydromorphone  

## 2021-03-26 ENCOUNTER — Ambulatory Visit: Payer: Medicare Other | Admitting: Nurse Practitioner

## 2021-04-04 ENCOUNTER — Ambulatory Visit (INDEPENDENT_AMBULATORY_CARE_PROVIDER_SITE_OTHER): Payer: Medicare Other | Admitting: Nurse Practitioner

## 2021-04-04 ENCOUNTER — Other Ambulatory Visit: Payer: Self-pay

## 2021-04-04 ENCOUNTER — Encounter: Payer: Self-pay | Admitting: Nurse Practitioner

## 2021-04-04 ENCOUNTER — Encounter (HOSPITAL_COMMUNITY): Payer: Self-pay | Admitting: Oncology

## 2021-04-04 ENCOUNTER — Emergency Department (HOSPITAL_COMMUNITY)
Admission: EM | Admit: 2021-04-04 | Discharge: 2021-04-04 | Disposition: A | Discharge status: Home / Self Care | Payer: Medicare Other | Attending: Emergency Medicine | Admitting: Emergency Medicine

## 2021-04-04 VITALS — BP 106/87 | HR 101 | Temp 99.5°F | Ht 63.0 in | Wt 116.8 lb

## 2021-04-04 DIAGNOSIS — D571 Sickle-cell disease without crisis: Secondary | ICD-10-CM | POA: Diagnosis not present

## 2021-04-04 DIAGNOSIS — J4520 Mild intermittent asthma, uncomplicated: Secondary | ICD-10-CM | POA: Insufficient documentation

## 2021-04-04 DIAGNOSIS — Z7901 Long term (current) use of anticoagulants: Secondary | ICD-10-CM | POA: Insufficient documentation

## 2021-04-04 DIAGNOSIS — G894 Chronic pain syndrome: Secondary | ICD-10-CM | POA: Diagnosis not present

## 2021-04-04 DIAGNOSIS — Z96649 Presence of unspecified artificial hip joint: Secondary | ICD-10-CM | POA: Insufficient documentation

## 2021-04-04 DIAGNOSIS — D5700 Hb-SS disease with crisis, unspecified: Secondary | ICD-10-CM | POA: Diagnosis present

## 2021-04-04 DIAGNOSIS — R110 Nausea: Secondary | ICD-10-CM | POA: Diagnosis not present

## 2021-04-04 DIAGNOSIS — M549 Dorsalgia, unspecified: Secondary | ICD-10-CM | POA: Diagnosis not present

## 2021-04-04 LAB — I-STAT BETA HCG BLOOD, ED (MC, WL, AP ONLY): I-stat hCG, quantitative: <5 m[IU]/mL (ref <5)

## 2021-04-04 LAB — CBC WITH DIFFERENTIAL/PLATELET
Abs Immature Granulocytes: 0.13 10*3/uL — ABNORMAL HIGH (ref 0.00–0.07)
Basophils Absolute: 0.1 10*3/uL (ref 0.0–0.1)
Basophils Relative: 0 %
Eosinophils Absolute: 0.1 10*3/uL (ref 0.0–0.5)
Eosinophils Relative: 1 %
HCT: 23.2 % — ABNORMAL LOW (ref 36.0–46.0)
Hemoglobin: 7.9 g/dL — ABNORMAL LOW (ref 12.0–15.0)
Immature Granulocytes: 1 %
Lymphocytes Relative: 20 %
Lymphs Abs: 3.6 10*3/uL (ref 0.7–4.0)
MCH: 29.9 pg (ref 26.0–34.0)
MCHC: 34.1 g/dL (ref 30.0–36.0)
MCV: 87.9 fL (ref 80.0–100.0)
Monocytes Absolute: 2.5 10*3/uL — ABNORMAL HIGH (ref 0.1–1.0)
Monocytes Relative: 13 %
Neutro Abs: 12.3 10*3/uL — ABNORMAL HIGH (ref 1.7–7.7)
Neutrophils Relative %: 65 %
Platelets: 751 10*3/uL — ABNORMAL HIGH (ref 150–400)
RBC: 2.64 MIL/uL — ABNORMAL LOW (ref 3.87–5.11)
RDW: 22.2 % — ABNORMAL HIGH (ref 11.5–15.5)
WBC: 18.7 10*3/uL — ABNORMAL HIGH (ref 4.0–10.5)
nRBC: 0.7 % — ABNORMAL HIGH (ref 0.0–0.2)

## 2021-04-04 LAB — COMPREHENSIVE METABOLIC PANEL
ALT: 12 U/L (ref 0–44)
AST: 37 U/L (ref 15–41)
Albumin: 4.9 g/dL (ref 3.5–5.0)
Alkaline Phosphatase: 62 U/L (ref 38–126)
Anion gap: 6 (ref 5–15)
BUN: 9 mg/dL (ref 6–20)
CO2: 25 mmol/L (ref 22–32)
Calcium: 9 mg/dL (ref 8.9–10.3)
Chloride: 108 mmol/L (ref 98–111)
Creatinine, Ser: 0.64 mg/dL (ref 0.44–1.00)
GFR, Estimated: >60 mL/min (ref >60)
Glucose, Bld: 96 mg/dL (ref 70–99)
Potassium: 3.3 mmol/L — ABNORMAL LOW (ref 3.5–5.1)
Sodium: 139 mmol/L (ref 135–145)
Total Bilirubin: 4 mg/dL — ABNORMAL HIGH (ref 0.3–1.2)
Total Protein: 8.6 g/dL — ABNORMAL HIGH (ref 6.5–8.1)

## 2021-04-04 LAB — RETICULOCYTES
Immature Retic Fract: 23.1 % — ABNORMAL HIGH (ref 2.3–15.9)
RBC.: 2.61 MIL/uL — ABNORMAL LOW (ref 3.87–5.11)
Retic Count, Absolute: 370.5 10*3/uL — ABNORMAL HIGH (ref 19.0–186.0)
Retic Ct Pct: 14.3 % — ABNORMAL HIGH (ref 0.4–3.1)

## 2021-04-04 MED ORDER — MORPHINE SULFATE ER 15 MG PO TBCR
15.0000 mg | EXTENDED_RELEASE_TABLET | Freq: Two times a day (BID) | ORAL | 0 refills | Status: DC
Start: 2021-04-04 — End: 2021-04-28

## 2021-04-04 MED ORDER — ONDANSETRON HCL 4 MG/2ML IJ SOLN
4.0000 mg | INTRAMUSCULAR | Status: DC | PRN
  Administered 2021-04-04: 4 mg via INTRAVENOUS
  Filled 2021-04-04: qty 2

## 2021-04-04 MED ORDER — HYDROMORPHONE HCL 2 MG/ML IJ SOLN
2.0000 mg | INTRAMUSCULAR | Status: AC
  Administered 2021-04-04: 2 mg via INTRAVENOUS
  Filled 2021-04-04: qty 1

## 2021-04-04 MED ORDER — DIPHENHYDRAMINE HCL 50 MG/ML IJ SOLN
25.0000 mg | Freq: Once | INTRAMUSCULAR | Status: AC
  Administered 2021-04-04: 25 mg via INTRAVENOUS
  Filled 2021-04-04: qty 1

## 2021-04-04 MED ORDER — HEPARIN SOD (PORK) LOCK FLUSH 100 UNIT/ML IV SOLN
500.0000 [IU] | Freq: Once | INTRAVENOUS | Status: AC
  Administered 2021-04-04: 500 [IU]

## 2021-04-04 MED ORDER — HYDROMORPHONE HCL 4 MG PO TABS: 4.0000 mg | ORAL_TABLET | Freq: Four times a day (QID) | ORAL | 0 refills | Status: DC | PRN

## 2021-04-04 MED ORDER — SODIUM CHLORIDE 0.45 % IV SOLN: INTRAVENOUS | Status: DC

## 2021-04-04 MED ORDER — HYDROMORPHONE HCL 2 MG/ML IJ SOLN
2.0000 mg | Freq: Once | INTRAMUSCULAR | Status: AC
Start: 2021-04-04 — End: 2021-04-04
  Administered 2021-04-04: 2 mg via INTRAVENOUS
  Filled 2021-04-04: qty 1

## 2021-04-04 NOTE — ED Triage Notes (Signed)
Pt c/o sickle cell pain crisis to back and legs reports pain started last night. Home medications not effective.

## 2021-04-04 NOTE — ED Provider Notes (Signed)
[facility] DEPT Provider Note   CSN: [id number] Arrival date & time: 04/04/21  1530     History Chief Complaint  Patient presents with   Sickle Cell Pain Crisis    Molly Gutierrez is a 40 y.o. female.  Pt presents to the ED today with sickle cell pain.  Pt said her pain is in her back and in her legs which is usual for her.  Pt denies f/c.  + nausea.  No vomiting.  Home meds are not working.      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   Sickle cell anemia with pain ([place]) 04/24/2020   Mild intermittent asthma 03/31/2020   Sickle cell anemia with crisis ([place]) 01/07/2020   Sickle cell crisis acute chest syndrome ([place]) 07/29/2019   Drug-seeking behavior 07/07/2019   Sinus tachycardia 07/07/2019   Therapeutic opioid-induced constipation (OIC) 07/07/2019   Breast mass in female 06/29/2019   Atelectasis of right lung 06/29/2019   Sickle cell crisis ([place]) 06/27/2019   Sickle cell pain crisis ([place]) 04/19/2019   Pneumonia 03/27/2019   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   Anterior chest wall pain 11/13/2018   Epigastric abdominal pain 11/13/2018   Hematuria 11/13/2018   Hyperbilirubinemia 11/12/2018   Long term current use of anticoagulant therapy 11/12/2018   History of pulmonary embolism 09/26/2018   Hx of pulmonary embolism ([place]) 09/26/2018   Opioid dependence ([place]) 09/13/2018   Port-A-Cath in place 09/13/2018   Narcotic abuse, continuous ([place]) 08/28/2018   Anemia 07/03/2018   Personal history of other venous thrombosis and embolism 07/03/2018   History of transfusion 07/03/2018   Gastro-esophageal reflux disease without esophagitis 06/02/2018   Asthma 05/19/2018    Anxiety and depression 10/30/2017   At risk for sepsis 06/13/2017   Mitral regurgitation 02/01/2014   Itching 12/13/2013   Nausea & vomiting 12/13/2013   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   Frequent complaints of pain 11/01/2013   Hypokalemia 09/19/2013   S/P total hip arthroplasty 05/19/2013   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, pelvic region and thigh 05/12/2013   Lower urinary tract infectious disease 03/02/2013   Chest pain 11/14/2012   Sickle-cell anemia ([place]) 10/30/2012   Pain management 08/01/2012   Reticulocytosis 07/31/2012   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     Family History  Problem Relation Age of Onset   Renal Disease Mother    Hypertension Mother    High Cholesterol Mother    Heart attack Mother    Diabetes Brother     Social History   Tobacco Use   Smoking status: Never   Smokeless tobacco: Never  Vaping Use  Vaping Use: Never used  Substance Use Topics   Alcohol use: Never   Drug use: Never    Home Medications Prior to Admission medications   Medication Sig Start Date End Date Taking? Authorizing Provider  albuterol (VENTOLIN HFA) 108 (90 Base) MCG/ACT inhaler Inhale 2 puffs into the lungs 2 (two) times daily as needed for wheezing or shortness of breath. 04/13/20 04/13/21 Yes Vevelyn Francois, NP  celecoxib (CELEBREX) 200 MG capsule Take 200 mg by mouth daily as needed for moderate pain or mild pain. 02/09/21  Yes [provider]  Cholecalciferol  (VITAMIN D3) 25 MCG (1000 UT) CAPS Take 1 capsule (1,000 Units total) by mouth daily. 04/13/20 04/13/21 Yes Vevelyn Francois, NP  Deferasirox (JADENU) 360 MG TABS Take 3 tablets (1,080 mg total) by mouth daily before breakfast. Patient taking differently: Take 1,440 mg by mouth at bedtime. 04/13/20 04/13/21 Yes Vevelyn Francois, NP  diphenhydrAMINE (BENADRYL) 25 mg capsule Take 25 mg by mouth 3 (three) times daily as needed for itching.   Yes [provider]  fluticasone (FLONASE) 50 MCG/ACT nasal spray Place 1 spray into both nostrils daily as needed for allergies or rhinitis.   Yes [provider]  folic acid (FOLVITE) 1 MG tablet Take 1 tablet (1 mg total) by mouth daily. 04/13/20 04/13/21 Yes King, Diona Foley, NP  HYDROmorphone (DILAUDID) 4 MG tablet Take 1 tablet (4 mg total) by mouth every 6 (six) hours as needed for up to 15 days for moderate pain or severe pain. 03/26/21 04/10/21 Yes Vevelyn Francois, NP  mirtazapine (REMERON) 45 MG tablet Take 1 tablet (45 mg total) by mouth at bedtime. 04/13/20 04/13/21 Yes King, Diona Foley, NP  mometasone-formoterol (DULERA) 100-5 MCG/ACT AERO Inhale 2 puffs into the lungs daily as needed for wheezing or shortness of breath. 04/13/20 04/13/21 Yes Vevelyn Francois, NP  morphine (MS CONTIN) 15 MG 12 hr tablet Take 1 tablet (15 mg total) by mouth every 12 (twelve) hours. Must last 30 days. 04/04/21 05/04/21 Yes Vevelyn Francois, NP  omeprazole (PRILOSEC) 20 MG capsule Take 1 capsule (20 mg total) by mouth 2 (two) times daily before a meal. 08/02/20 08/02/21 Yes King, Diona Foley, NP  polyethylene glycol (MIRALAX / GLYCOLAX) 17 g packet Take 17 g by mouth daily as needed for mild constipation.   Yes [provider]  promethazine (PHENERGAN) 25 MG tablet Take 0.5-1 tablets (12.5-25 mg total) by mouth every 6 (six) hours as needed for nausea or vomiting. 10/25/20 10/25/21 Yes Vevelyn Francois, NP  vitamin B-12 (CYANOCOBALAMIN) 1000 MCG tablet Take 1  tablet (1,000 mcg total) by mouth daily. 04/13/20 04/13/21 Yes King, Diona Foley, NP  voxelotor (OXBRYTA) 500 MG TABS tablet Take 1,500 mg by mouth at bedtime.   Yes [provider]  XARELTO 20 MG TABS tablet Take 1 tablet (20 mg total) by mouth daily with supper. 10/25/20 10/25/21 Yes King, Diona Foley, NP  HYDROmorphone (DILAUDID) 4 MG tablet Take 1 tablet (4 mg total) by mouth every 6 (six) hours as needed for up to 15 days for moderate pain or severe pain. Patient not taking: No sig reported 04/10/21 04/25/21  Vevelyn Francois, NP    Allergies    Cefaclor, Hydroxyurea, Omeprazole, Tomato, and Ketamine  Review of Systems   Review of Systems  Musculoskeletal:  Positive for myalgias.  All other systems reviewed and are negative.  Physical Exam Updated Vital Signs BP 111/83   Pulse 88  Temp 99.2 F (37.3 C) (Oral)   Resp 18   Ht 5\' 3"  (1.6 m)   Wt 57.6 kg   LMP 03/30/2021 (Exact Date)   SpO2 96%   BMI 22.50 kg/m   Physical Exam Vitals and nursing note reviewed.  Constitutional:      Appearance: Normal appearance.  HENT:     Head: Normocephalic and atraumatic.     Right Ear: External ear normal.     Left Ear: External ear normal.     Nose: Nose normal.     Mouth/Throat:     Mouth: Mucous membranes are dry.  Eyes:     Extraocular Movements: Extraocular movements intact.     Conjunctiva/sclera: Conjunctivae normal.     Pupils: Pupils are equal, round, and reactive to light.  Cardiovascular:     Rate and Rhythm: Normal rate and regular rhythm.     Pulses: Normal pulses.     Heart sounds: Normal heart sounds.  Pulmonary:     Effort: Pulmonary effort is normal.     Breath sounds: Normal breath sounds.  Abdominal:     General: Abdomen is flat. Bowel sounds are normal.     Palpations: Abdomen is soft.  Musculoskeletal:        General: Normal range of motion.     Cervical back: Normal range of motion and neck supple.  Skin:    General: Skin is warm.     Capillary  Refill: Capillary refill takes less than 2 seconds.  Neurological:     General: No focal deficit present.     Mental Status: She is alert and oriented to person, place, and time.  Psychiatric:        Mood and Affect: Mood normal.        Behavior: Behavior normal.    ED Results / Procedures / Treatments   Labs (all labs ordered are listed, but only abnormal results are displayed) Labs Reviewed  CBC WITH DIFFERENTIAL/PLATELET - Abnormal; Notable for the following components:      Result Value   WBC 18.7 (*)    RBC 2.64 (*)    Hemoglobin 7.9 (*)    HCT 23.2 (*)    RDW 22.2 (*)    Platelets 751 (*)    nRBC 0.7 (*)    Neutro Abs 12.3 (*)    Monocytes Absolute 2.5 (*)    Abs Immature Granulocytes 0.13 (*)    All other components within normal limits  RETICULOCYTES - Abnormal; Notable for the following components:   Retic Ct Pct 14.3 (*)    RBC. 2.61 (*)    Retic Count, Absolute 370.5 (*)    Immature Retic Fract 23.1 (*)    All other components within normal limits  COMPREHENSIVE METABOLIC PANEL - Abnormal; Notable for the following components:   Potassium 3.3 (*)    Total Protein 8.6 (*)    Total Bilirubin 4.0 (*)    All other components within normal limits  I-STAT BETA HCG BLOOD, ED ([facility], [facility], [facility] ONLY)    EKG None  Radiology No results found.  Procedures Procedures   Medications Ordered in ED Medications  0.45 % sodium chloride infusion ( Intravenous New Bag/Given 04/04/21 1746)  ondansetron (ZOFRAN) injection 4 mg (4 mg Intravenous Given 04/04/21 1649)  diphenhydrAMINE (BENADRYL) injection 25 mg (has no administration in time range)  HYDROmorphone (DILAUDID) injection 2 mg (has no administration in time range)  HYDROmorphone (DILAUDID) injection 2 mg (2 mg Intravenous Given 04/04/21 1649)  HYDROmorphone (DILAUDID) injection 2 mg (2 mg Intravenous Given 04/04/21 1746)  diphenhydrAMINE (BENADRYL) injection 25 mg (25 mg Intravenous Given 04/04/21 1649)    ED Course   I have reviewed the triage vital signs and the nursing notes.  Pertinent labs & imaging results that were available during my care of the patient were reviewed by me and considered in my medical decision making (see chart for details).    MDM Rules/Calculators/A&P                           Pt is feeling better after treatment.  She is stable for d/c home.  Return if worse. Final Clinical Impression(s) / ED Diagnoses Final diagnoses:  Sickle cell pain crisis [facility])    Rx / DC Orders ED Discharge Orders     None        Isla Pence, MD 04/04/21 1816

## 2021-04-04 NOTE — Progress Notes (Signed)
Molly Gutierrez, [place]  [address] Phone:  [phone]   Fax:  [phone]   Established Patient Office Visit  Subjective:  Patient ID: Molly Gutierrez, female    DOB: 1980-08-25  Age: 40 y.o. MRN: [id number]  CC:  Chief Complaint  Patient presents with   Follow-up    Follow up;Sickle cell anemia Pt needs refills on all her medications. Pt states she is not feeling well today with body aches today.    HPI [facility] presents for follow up  She  has a past medical history of Asthma, Eczema, History of pulmonary embolus (PE), and Sickle cell anemia ([place]).   She is in today for follow up. [place] and was previously seen by hematology at [facility] and receives monthly exchange transfusions.  She is currently on Jadenu 1080 mg daily . She receives RBC for hemoglobin < 6.5 possibly less than 7 based on symptoms.   She has multiple ED visit both within the last 6 months with admissions. Her home pain treatment is hydromorphone which she has been on for several years. She reports being asked to see if she could benefit from long acting medication to help reduce ED visits.  Past Medical History:  Diagnosis Date   Asthma    Eczema    History of pulmonary embolus (PE)    Sickle cell anemia ([facility])     Past Surgical History:  Procedure Laterality Date   CHOLECYSTECTOMY     ERCP     JOINT REPLACEMENT     PORTA CATH INSERTION     TUBAL LIGATION     WISDOM TOOTH EXTRACTION      Family History  Problem Relation Age of Onset   Renal Disease Mother    Hypertension Mother    High Cholesterol Mother    Heart attack Mother    Diabetes Brother     Social History   Socioeconomic History   Marital status: Single    Spouse name: Not on file   Number of children: Not on file   Years of education: Not on file   Highest education level: Not on file  Occupational History   Not on file  Tobacco  Use   Smoking status: Never   Smokeless tobacco: Never  Vaping Use   Vaping Use: Never used  Substance and Sexual Activity   Alcohol use: Never   Drug use: Never   Sexual activity: Not Currently  Other Topics Concern   Not on file  Social History Narrative   Not on file   Social Determinants of Health   Financial Resource Strain: Low Risk    Difficulty of Paying Living Expenses: Not very hard  Food Insecurity: No Food Insecurity   Worried About Running Out of Food in the Last Year: Never true   Ran Out of Food in the Last Year: Never true  Transportation Needs: No Transportation Needs   Lack of Transportation (Medical): No   Lack of Transportation (Non-Medical): No  Physical Activity: Sufficiently Active   Days of Exercise per Week: 4 days   Minutes of Exercise per Session: 40 min  Stress: No Stress Concern Present   Feeling of Stress : Only a little  Social Connections: Moderately Integrated   Frequency of Communication with Friends and Family: More than three times a week   Frequency of Social Gatherings with Friends and Family: More than three times a week  Attends Religious Services: More than 4 times per year   Active Member of Clubs or Organizations: Yes   Attends Archivist Meetings: More than 4 times per year   Marital Status: Never married  Human resources officer Violence: Not At Risk   Fear of Current or Ex-Partner: No   Emotionally Abused: No   Physically Abused: No   Sexually Abused: No    Outpatient Medications Prior to Visit  Medication Sig Dispense Refill   albuterol (VENTOLIN HFA) 108 (90 Base) MCG/ACT inhaler Inhale 2 puffs into the lungs 2 (two) times daily as needed for wheezing or shortness of breath. 8 g 11   celecoxib (CELEBREX) 200 MG capsule Take 200 mg by mouth daily as needed for moderate pain or mild pain.     Cholecalciferol (VITAMIN D3) 25 MCG (1000 UT) CAPS Take 1 capsule (1,000 Units total) by mouth daily. 90 capsule 3   Deferasirox  (JADENU) 360 MG TABS Take 3 tablets (1,080 mg total) by mouth daily before breakfast. (Patient taking differently: Take 1,440 mg by mouth at bedtime.) 90 tablet 11   diphenhydrAMINE (BENADRYL) 25 mg capsule Take 25 mg by mouth 3 (three) times daily as needed for itching.     folic acid (FOLVITE) 1 MG tablet Take 1 tablet (1 mg total) by mouth daily. 90 tablet 3   HYDROmorphone (DILAUDID) 4 MG tablet Take 1 tablet (4 mg total) by mouth every 6 (six) hours as needed for up to 15 days for moderate pain or severe pain. 60 tablet 0   mirtazapine (REMERON) 45 MG tablet Take 1 tablet (45 mg total) by mouth at bedtime. 90 tablet 3   mometasone-formoterol (DULERA) 100-5 MCG/ACT AERO Inhale 2 puffs into the lungs daily as needed for wheezing or shortness of breath. 1 each 5   omeprazole (PRILOSEC) 20 MG capsule Take 1 capsule (20 mg total) by mouth 2 (two) times daily before a meal. 180 capsule 3   polyethylene glycol (MIRALAX / GLYCOLAX) 17 g packet Take 17 g by mouth daily as needed for mild constipation.     promethazine (PHENERGAN) 25 MG tablet Take 0.5-1 tablets (12.5-25 mg total) by mouth every 6 (six) hours as needed for nausea or vomiting. 30 tablet 11   vitamin B-12 (CYANOCOBALAMIN) 1000 MCG tablet Take 1 tablet (1,000 mcg total) by mouth daily. 90 tablet 3   voxelotor (OXBRYTA) 500 MG TABS tablet Take 1,500 mg by mouth at bedtime.     XARELTO 20 MG TABS tablet Take 1 tablet (20 mg total) by mouth daily with supper. 90 tablet 3   No facility-administered medications prior to visit.    Allergies  Allergen Reactions   Cefaclor Hives and Swelling   Hydroxyurea Palpitations and Other (See Comments)    Lowers "blood levels" and heart rate (causes HYPOtension); "it messes me up, it drops my levels and stuff"    Omeprazole Anaphylaxis and Other (See Comments)    Causes "sharp pains in the stomach"   Tomato Other (See Comments)    Break out    Ketamine Palpitations and Other (See Comments)    "Pt  states she has had previous reaction to ketamine. States she becomes flushed, heart races, dizzy, and feels like she is going to pass out."    ROS Review of Systems    Objective:    Physical Exam Constitutional:      General: She is not in acute distress.    Appearance: She is normal weight. She is  not ill-appearing, toxic-appearing or diaphoretic.  HENT:     Head: Normocephalic and atraumatic.  Cardiovascular:     Rate and Rhythm: Normal rate and regular rhythm.     Pulses: Normal pulses.     Heart sounds: Normal heart sounds.  Pulmonary:     Effort: Pulmonary effort is normal.     Breath sounds: Normal breath sounds.  Abdominal:     Palpations: Abdomen is soft.  Musculoskeletal:        General: Normal range of motion.     Cervical back: Normal range of motion.  Skin:    General: Skin is warm and dry.     Capillary Refill: Capillary refill takes less than 2 seconds.  Neurological:     General: No focal deficit present.     Mental Status: She is alert and oriented to person, place, and time.  Psychiatric:        Mood and Affect: Mood normal.        Behavior: Behavior normal.        Thought Content: Thought content normal.        Judgment: Judgment normal.    BP 106/87   Pulse (!) 101   Temp 99.5 F (37.5 C)   Ht 5\' 3"  (1.6 m)   Wt 116 lb 12.8 oz (53 kg)   LMP 03/30/2021 (Exact Date)   SpO2 98%   BMI 20.69 kg/m  Wt Readings from Last 3 Encounters:  04/04/21 127 lb (57.6 kg)  04/04/21 116 lb 12.8 oz (53 kg)  03/17/21 127 lb (57.6 kg)     There are no preventive care reminders to display for this patient.   There are no preventive care reminders to display for this patient.  No results found for: TSH Lab Results  Component Value Date   WBC 18.7 (H) 04/04/2021   HGB 7.9 (L) 04/04/2021   HCT 23.2 (L) 04/04/2021   MCV 87.9 04/04/2021   PLT 751 (H) 04/04/2021   Lab Results  Component Value Date   NA 139 04/04/2021   K 3.3 (L) 04/04/2021   CO2 25  04/04/2021   GLUCOSE 96 04/04/2021   BUN 9 04/04/2021   CREATININE 0.64 04/04/2021   BILITOT 4.0 (H) 04/04/2021   ALKPHOS 62 04/04/2021   AST 37 04/04/2021   ALT 12 04/04/2021   PROT 8.6 (H) 04/04/2021   ALBUMIN 4.9 04/04/2021   CALCIUM 9.0 04/04/2021   ANIONGAP 6 04/04/2021   No results found for: CHOL No results found for: HDL No results found for: LDLCALC No results found for: TRIG No results found for: CHOLHDL No results found for: HGBA1C    Assessment & Plan:   Problem List Items Addressed This Visit       Respiratory   Mild intermittent asthma     Other   Chronic pain syndrome Stared MS Contin 15 mg BID along with hyrdomorphone   Relevant Medications   morphine (MS CONTIN) 15 MG 12 hr tablet   HYDROmorphone (DILAUDID) 4 MG tablet (Start on 04/10/2021)   Other Visit Diagnoses     Hb-SS disease without crisis ([place])    -  Primary' Continue to follow up with hematology Ensure adequate hydration. Move frequently to reduce venous thromboembolism risk. Avoid situations that could lead to dehydration or could exacerbate pain Discussed S&S of infection, seizures, stroke acute chest, DVT and how important it is to seek medical attention Take medication as directed along with pain contract and overall compliance Discussed  the risk related to opiate use (addition, tolerance and dependency)    Anticoagulant long-term use           Meds ordered this encounter  Medications   morphine (MS CONTIN) 15 MG 12 hr tablet    Sig: Take 1 tablet (15 mg total) by mouth every 12 (twelve) hours. Must last 30 days.    Dispense:  60 tablet    Refill:  0    Order Specific Question:   Supervising Provider    Answer:   [name] [[id number]]   HYDROmorphone (DILAUDID) 4 MG tablet    Sig: Take 1 tablet (4 mg total) by mouth every 6 (six) hours as needed for up to 15 days for moderate pain or severe pain.    Dispense:  60 tablet    Refill:  0    The refill date is  04/10/2021. Thank you.    Order Specific Question:   Supervising Provider    Answer:   [name] [[id number]]    Follow-up: Return in about 6 weeks (around 05/16/2021) for Follow up SCD 66440, follow up medication management 99213.    Vevelyn Francois, NP

## 2021-04-04 NOTE — Patient Instructions (Signed)
Sickle Cell Anemia, Adult Sickle cell anemia is a condition where your red blood cells are shaped like sickles. Red blood cells carry oxygen through the body. Sickle-shaped cells do not live as long as normal red blood cells. They also clump together and block blood from flowing through the blood vessels. This prevents the body from getting enough oxygen. Sickle cell anemia causes organ damage and pain. It also increases the risk of infection. Follow these instructions at home: Medicines Take over-the-counter and prescription medicines only as told by your doctor. If you were prescribed an antibiotic medicine, take it as told by your doctor. Do not stop taking the antibiotic even if you start to feel better. If you develop a fever, do not take medicines to lower the fever right away. Tell your doctor about the fever. Managing pain, stiffness, and swelling Try these methods to help with pain: Use a heating pad. Take a warm bath. Distract yourself, such as by watching TV. Eating and drinking Drink enough fluid to keep your pee (urine) clear or pale yellow. Drink more in hot weather and during exercise. Limit or avoid alcohol. Eat a healthy diet. Eat plenty of fruits, vegetables, whole grains, and lean protein. Take vitamins and supplements as told by your doctor. Traveling When traveling, keep these with you: Your medical information. The names of your doctors. Your medicines. If you need to take an airplane, talk to your doctor first. Activity Rest often. Avoid exercises that make your heart beat much faster, such as jogging. General instructions Do not use products that have nicotine or tobacco, such as cigarettes and e-cigarettes. If you need help quitting, ask your doctor. Consider wearing a medical alert bracelet. Avoid being in high places (high altitudes), such as mountains. Avoid very hot or cold temperatures. Avoid places where the temperature changes a lot. Keep all follow-up  visits as told by your doctor. This is important. Contact a doctor if: A joint hurts. Your feet or hands hurt or swell. You feel tired (fatigued). Get help right away if: You have symptoms of infection. These include: Fever. Chills. Being very tired. Irritability. Poor eating. Throwing up (vomiting). You feel dizzy or faint. You have new stomach pain, especially on the left side. You have a an erection (priapism) that lasts more than 4 hours. You have numbness in your arms or legs. You have a hard time moving your arms or legs. You have trouble talking. You have pain that does not go away when you take medicine. You are short of breath. You are breathing fast. You have a long-term cough. You have pain in your chest. You have a bad headache. You have a stiff neck. Your stomach looks bloated even though you did not eat much. Your skin is pale. You suddenly cannot see well. Summary Sickle cell anemia is a condition where your red blood cells are shaped like sickles. Follow your doctor's advice on ways to manage pain, food to eat, activities to do, and steps to take for safe travel. Get medical help right away if you have any signs of infection, such as a fever. This information is not intended to replace advice given to you by your health care provider. Make sure you discuss any questions you have with your health care provider. Document Revised: 10/28/2019 Document Reviewed: 10/28/2019 Elsevier Patient Education  [place].

## 2021-04-07 ENCOUNTER — Encounter: Payer: Self-pay | Admitting: Nurse Practitioner

## 2021-04-20 ENCOUNTER — Telehealth: Payer: Self-pay

## 2021-04-20 ENCOUNTER — Other Ambulatory Visit: Payer: Self-pay | Admitting: Nurse Practitioner

## 2021-04-20 MED ORDER — XARELTO 20 MG PO TABS: 20.0000 mg | ORAL_TABLET | Freq: Every day | ORAL | 3 refills | Status: DC

## 2021-04-20 MED ORDER — MIRTAZAPINE 45 MG PO TABS: 45.0000 mg | ORAL_TABLET | Freq: Every day | ORAL | 3 refills | Status: DC

## 2021-04-20 NOTE — Telephone Encounter (Addendum)
Xarelto Mirtazapine Morphine 15 mg Hydromorphone 4 mg

## 2021-04-20 NOTE — Telephone Encounter (Incomplete Revision)
Xarelto Mirtazapine Morphine 15 mg Hydromorphone 4 mg

## 2021-04-23 ENCOUNTER — Telehealth: Payer: Self-pay | Admitting: Nurse Practitioner

## 2021-04-23 ENCOUNTER — Other Ambulatory Visit: Payer: Self-pay

## 2021-04-23 MED ORDER — OMEPRAZOLE 20 MG PO CPDR: 20.0000 mg | DELAYED_RELEASE_CAPSULE | Freq: Two times a day (BID) | ORAL | 3 refills | Status: DC

## 2021-04-23 NOTE — Telephone Encounter (Signed)
Patient left VM requesting Hydromorphone 4mg  and Morphine 15mg  to be sent to the [facility] on [address] in [place].

## 2021-04-24 ENCOUNTER — Other Ambulatory Visit: Payer: Self-pay

## 2021-04-24 ENCOUNTER — Encounter (HOSPITAL_COMMUNITY): Payer: Self-pay

## 2021-04-24 ENCOUNTER — Inpatient Hospital Stay (HOSPITAL_COMMUNITY)
Admission: EM | Admit: 2021-04-24 | Discharge: 2021-04-28 | DRG: 812 | Disposition: A | Discharge status: Home / Self Care | Payer: Medicare Other | Attending: Internal Medicine | Admitting: Internal Medicine

## 2021-04-24 ENCOUNTER — Emergency Department (HOSPITAL_COMMUNITY): Payer: Medicare Other

## 2021-04-24 DIAGNOSIS — G894 Chronic pain syndrome: Secondary | ICD-10-CM | POA: Diagnosis not present

## 2021-04-24 DIAGNOSIS — D571 Sickle-cell disease without crisis: Secondary | ICD-10-CM

## 2021-04-24 DIAGNOSIS — J4520 Mild intermittent asthma, uncomplicated: Secondary | ICD-10-CM | POA: Diagnosis not present

## 2021-04-24 DIAGNOSIS — R079 Chest pain, unspecified: Secondary | ICD-10-CM

## 2021-04-24 DIAGNOSIS — D638 Anemia in other chronic diseases classified elsewhere: Secondary | ICD-10-CM | POA: Diagnosis present

## 2021-04-24 DIAGNOSIS — Z79899 Other long term (current) drug therapy: Secondary | ICD-10-CM

## 2021-04-24 DIAGNOSIS — Z7951 Long term (current) use of inhaled steroids: Secondary | ICD-10-CM | POA: Diagnosis not present

## 2021-04-24 DIAGNOSIS — J069 Acute upper respiratory infection, unspecified: Secondary | ICD-10-CM | POA: Diagnosis not present

## 2021-04-24 DIAGNOSIS — Z888 Allergy status to other drugs, medicaments and biological substances status: Secondary | ICD-10-CM

## 2021-04-24 DIAGNOSIS — Z8249 Family history of ischemic heart disease and other diseases of the circulatory system: Secondary | ICD-10-CM

## 2021-04-24 DIAGNOSIS — Z841 Family history of disorders of kidney and ureter: Secondary | ICD-10-CM

## 2021-04-24 DIAGNOSIS — D5700 Hb-SS disease with crisis, unspecified: Secondary | ICD-10-CM | POA: Diagnosis present

## 2021-04-24 DIAGNOSIS — F1120 Opioid dependence, uncomplicated: Secondary | ICD-10-CM | POA: Diagnosis present

## 2021-04-24 DIAGNOSIS — Z86711 Personal history of pulmonary embolism: Secondary | ICD-10-CM | POA: Diagnosis present

## 2021-04-24 DIAGNOSIS — Z833 Family history of diabetes mellitus: Secondary | ICD-10-CM

## 2021-04-24 DIAGNOSIS — Z7901 Long term (current) use of anticoagulants: Secondary | ICD-10-CM

## 2021-04-24 DIAGNOSIS — Z9109 Other allergy status, other than to drugs and biological substances: Secondary | ICD-10-CM

## 2021-04-24 DIAGNOSIS — Z20822 Contact with and (suspected) exposure to covid-19: Secondary | ICD-10-CM | POA: Diagnosis present

## 2021-04-24 DIAGNOSIS — Z83438 Family history of other disorder of lipoprotein metabolism and other lipidemia: Secondary | ICD-10-CM

## 2021-04-24 DIAGNOSIS — D72829 Elevated white blood cell count, unspecified: Secondary | ICD-10-CM | POA: Diagnosis present

## 2021-04-24 NOTE — ED Notes (Signed)
Pt request for labs to be pulled from right port

## 2021-04-24 NOTE — ED Triage Notes (Signed)
Pt reports with [facility] since Sunday. Pt complains of chest and abdominal pain.

## 2021-04-24 NOTE — ED Provider Notes (Signed)
[facility] DEPT Provider Note   CSN: [id number] Arrival date & time: 04/24/21  2224     History Chief Complaint  Patient presents with   Sickle Cell Pain Crisis    Molly Gutierrez is a 40 y.o. female with a history of sickle cell anemia, pulmonary embolism on Xarelto, asthma, anxiety, GERD, chronic pain syndrome, and cholecystectomy who presents to the emergency department with complaints of sickle cell pain for the past 3 days.  Patient states she is having pain to her chest anteriorly, it is constant, worse with coughing, no alleviating factors.  She states she is had a cold for the past week including congestion, sore throat, and cough productive of phlegm sputum, seems to be improving some but is still coughing..  Her chest pain feels like prior sickle cell pain crisis.  She is been taking her at home pain medication without relief.  She also is having some lower abdominal cramping that is intermittent.  Last menstrual period was 03/30/2021.  She denies fever, chills, vomiting, diarrhea, vaginal bleeding, vaginal discharge, dysuria, leg pain/swelling, or syncope.  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   Sickle cell anemia with pain ([place]) 04/24/2020   Mild intermittent asthma 03/31/2020   Sickle cell anemia with crisis ([place]) 01/07/2020   Sickle cell crisis acute chest syndrome ([place]) 07/29/2019   Drug-seeking behavior 07/07/2019   Sinus tachycardia 07/07/2019   Therapeutic opioid-induced constipation ([facility]) 07/07/2019   Breast mass in female 06/29/2019   Atelectasis of right lung 06/29/2019   Sickle cell crisis ([place]) 06/27/2019   Sickle cell pain crisis ([place]) 04/19/2019    Pneumonia 03/27/2019   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   Anterior chest wall pain 11/13/2018   Epigastric abdominal pain 11/13/2018   Hematuria 11/13/2018   Hyperbilirubinemia 11/12/2018   Long term current use of anticoagulant therapy 11/12/2018   History of pulmonary embolism 09/26/2018   Hx of pulmonary embolism ([place]) 09/26/2018   Opioid dependence ([place]) 09/13/2018   Port-A-Cath in place 09/13/2018   Narcotic abuse, continuous ([place]) 08/28/2018   Anemia 07/03/2018   Personal history of other venous thrombosis and embolism 07/03/2018   History of transfusion 07/03/2018   Gastro-esophageal reflux disease without esophagitis 06/02/2018   Asthma 05/19/2018   Anxiety and depression 10/30/2017   At risk for sepsis 06/13/2017   Mitral regurgitation 02/01/2014   Itching 12/13/2013   Nausea & vomiting 12/13/2013   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   Frequent complaints of pain 11/01/2013   Hypokalemia 09/19/2013   S/P total hip arthroplasty 05/19/2013   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, pelvic region and thigh 05/12/2013   Lower urinary tract infectious disease 03/02/2013   Chest pain 11/14/2012   Sickle-cell anemia ([place]) 10/30/2012   Pain management 08/01/2012   Reticulocytosis 07/31/2012   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     Family History  Problem Relation Age of Onset   Renal Disease Mother    Hypertension Mother    High Cholesterol Mother    Heart attack Mother    Diabetes  Brother     Social History   Tobacco Use   Smoking status: Never   Smokeless tobacco: Never  Vaping Use   Vaping Use: Never used  Substance Use Topics   Alcohol use: Never   Drug use: Never    Home Medications Prior to Admission medications   Medication Sig Start Date End Date Taking? Authorizing Provider  albuterol (VENTOLIN HFA) 108 (90 Base) MCG/ACT inhaler Inhale 2 puffs into the lungs 2 (two) times daily as needed for wheezing or shortness of breath. 04/13/20 04/13/21  Vevelyn Francois, NP  celecoxib (CELEBREX) 200 MG capsule Take 200 mg by mouth daily as needed for moderate pain or mild pain. 02/09/21   [provider]  diphenhydrAMINE (BENADRYL) 25 mg capsule Take 25 mg by mouth 3 (three) times daily as needed for itching.    [provider]  fluticasone (FLONASE) 50 MCG/ACT nasal spray Place 1 spray into both nostrils daily as needed for allergies or rhinitis.    [provider]  HYDROmorphone (DILAUDID) 4 MG tablet Take 1 tablet (4 mg total) by mouth every 6 (six) hours as needed for up to 15 days for moderate pain or severe pain. Patient not taking: No sig reported 04/10/21 04/25/21  Vevelyn Francois, NP  mirtazapine (REMERON) 45 MG tablet Take 1 tablet (45 mg total) by mouth at bedtime. 04/20/21 04/20/22  Vevelyn Francois, NP  mometasone-formoterol (DULERA) 100-5 MCG/ACT AERO Inhale 2 puffs into the lungs daily as needed for wheezing or shortness of breath. 04/13/20 04/13/21  Vevelyn Francois, NP  morphine (MS CONTIN) 15 MG 12 hr tablet Take 1 tablet (15 mg total) by mouth every 12 (twelve) hours. Must last 30 days. 04/04/21 05/04/21  Vevelyn Francois, NP  omeprazole (PRILOSEC) 20 MG capsule Take 1 capsule (20 mg total) by mouth 2 (two) times daily before a meal. 04/23/21 04/23/22  Vevelyn Francois, NP  polyethylene glycol (MIRALAX / GLYCOLAX) 17 g packet Take 17 g by mouth daily as needed for mild constipation.    [provider]  promethazine  (PHENERGAN) 25 MG tablet Take 0.5-1 tablets (12.5-25 mg total) by mouth every 6 (six) hours as needed for nausea or vomiting. 10/25/20 10/25/21  Vevelyn Francois, NP  voxelotor (OXBRYTA) 500 MG TABS tablet Take 1,500 mg by mouth at bedtime.    [provider]  XARELTO 20 MG TABS tablet Take 1 tablet (20 mg total) by mouth daily with supper. 04/20/21 04/20/22  Vevelyn Francois, NP    Allergies    Cefaclor, Hydroxyurea, Omeprazole, Tomato, and Ketamine  Review of Systems   Review of Systems  Constitutional:  Negative for chills and fever.  HENT:  Positive for congestion and sore throat. Negative for ear pain.   Respiratory:  Positive for cough.   Cardiovascular:  Positive for chest pain. Negative for leg swelling.  Gastrointestinal:  Positive for abdominal pain and nausea. Negative for blood in stool, constipation, diarrhea and vomiting.  Genitourinary:  Negative for dysuria, vaginal bleeding and vaginal discharge.  Neurological:  Negative for syncope.  All other systems reviewed and are negative.  Physical Exam Updated Vital Signs BP 100/88   Pulse 92   Temp 98.7 F (37.1 C) (Oral)   Resp 13   LMP 03/30/2021 (Exact Date)   SpO2 100%   Physical Exam Vitals and nursing note reviewed.  Constitutional:      General: She is not in acute distress.    Appearance: She is well-developed.  HENT:     Head: Normocephalic and atraumatic.     Right Ear: Ear canal normal. Tympanic membrane is not perforated, erythematous, retracted or bulging.     Left Ear: Ear canal normal. Tympanic membrane is not perforated, erythematous, retracted or bulging.     Ears:     Comments: No mastoid erythema/swelling/tenderness.     Nose:     Right Sinus: No maxillary sinus tenderness or frontal sinus tenderness.     Left Sinus: No maxillary sinus tenderness or frontal sinus tenderness.     Mouth/Throat:     Pharynx: Uvula midline. No oropharyngeal exudate or posterior oropharyngeal erythema.      Comments: Posterior oropharynx is symmetric appearing. Patient tolerating own secretions without difficulty. No trismus. No drooling. No hot potato voice. No swelling beneath the tongue, submandibular compartment is soft.  Eyes:     General:        Right eye: No discharge.        Left eye: No discharge.     Conjunctiva/sclera: Conjunctivae normal.     Pupils: Pupils are equal, round, and reactive to light.  Cardiovascular:     Rate and Rhythm: Normal rate and regular rhythm.     Heart sounds: No murmur heard. Pulmonary:     Effort: Pulmonary effort is normal. No respiratory distress.     Breath sounds: Normal breath sounds. No wheezing, rhonchi or rales.  Chest:     Chest wall: Tenderness (anterior chest wall) present.  Abdominal:     General: There is no distension.     Palpations: Abdomen is soft.     Tenderness: There is no abdominal tenderness. There is no guarding or rebound.  Musculoskeletal:     Cervical back: Normal range of motion and neck supple. No edema or rigidity.  Lymphadenopathy:     Cervical: No cervical adenopathy.  Skin:    General: Skin is warm and dry.     Findings: No rash.  Neurological:     Mental Status: She is alert.  Psychiatric:        Behavior: Behavior normal.    ED Results / Procedures / Treatments   Labs (all labs ordered are listed, but only abnormal results are displayed) Labs Reviewed  CBC WITH DIFFERENTIAL/PLATELET - Abnormal; Notable for the following components:      Result Value   WBC 17.1 (*)    RBC 2.45 (*)    Hemoglobin 7.8 (*)    HCT 21.9 (*)    RDW 21.2 (*)    Platelets 557 (*)    nRBC 0.5 (*)    Neutro Abs 11.4 (*)    Monocytes Absolute 2.3 (*)    Abs Immature Granulocytes 0.09 (*)    All other components within normal limits  COMPREHENSIVE METABOLIC PANEL - Abnormal; Notable for the following components:   Sodium 134 (*)    Total Protein 8.4 (*)    Total Bilirubin 3.1 (*)    All other components within normal limits   RETICULOCYTES - Abnormal; Notable for  the following components:   Retic Ct Pct 13.6 (*)    RBC. 2.43 (*)    Retic Count, Absolute 330.0 (*)    Immature Retic Fract 20.8 (*)    All other components within normal limits  URINALYSIS, ROUTINE W REFLEX MICROSCOPIC - Abnormal; Notable for the following components:   APPearance HAZY (*)    Protein, ur 30 (*)    Leukocytes,Ua TRACE (*)    Bacteria, UA RARE (*)    All other components within normal limits  RESP PANEL BY RT-PCR (FLU A&B, COVID) ARPGX2  LIPASE, BLOOD  I-STAT BETA HCG BLOOD, ED ([facility], [facility], [facility] ONLY)  TROPONIN I (HIGH SENSITIVITY)  TROPONIN I (HIGH SENSITIVITY)    EKG EKG Interpretation  Date/Time:  Tuesday April 24 2021 23:30:16 EST Ventricular Rate:  90 PR Interval:  126 QRS Duration: 67 QT Interval:  371 QTC Calculation: 454 R Axis:   25 Text Interpretation: Sinus rhythm Abnormal R-wave progression, early transition No significant change was found Confirmed by Ezequiel Essex [phone]) on 04/24/2021 11:52:25 PM  Radiology DG Chest 2 View  Result Date: 04/25/2021 CLINICAL DATA:  Chest pain, abdominal pain. EXAM: CHEST - 2 VIEW COMPARISON:  Chest x-ray 03/17/2021, CT angiography chest 03/17/2021 FINDINGS: Right chest wall Port-A-Cath with tip overlying the expected region of the superior cavoatrial junction. Left chest wall central venous catheter with tip overlying the right atrium. The heart and mediastinal contours are unchanged. No focal consolidation. No pulmonary edema. No pleural effusion. No pneumothorax. No acute osseous abnormality. IMPRESSION: No active cardiopulmonary disease. Electronically Signed   By: Iven Finn M.D.   On: 04/25/2021 00:02    Procedures Procedures   Medications Ordered in ED Medications - No data to display  ED Course  I have reviewed the triage vital signs and the nursing notes.  Pertinent labs & imaging results that were available during my care of the patient were reviewed by me  and considered in my medical decision making (see chart for details).    MDM Rules/Calculators/A&P                           Patient presents to the ED with complaints of sickle cell pain crisis with chest pain.  She reports URI symptoms that started a week ago, overall improving but still has a productive cough.  3 days prior developed chest pain. EKG: No significant change compared to prior.   Additional history obtained:  Additional history obtained from chart review & nursing note review.   Lab Tests:  I Ordered, reviewed, and interpreted labs, which included:  CBC: Leukocytosis and anemia similar to prior CMP: Total bilirubin similar to prior Reticulocytes: Similar to prior Lipase: Within normal limits Troponins: Flat  Imaging Studies ordered:  I ordered imaging studies which included CXR, I independently reviewed, formal radiology impression shows: No active cardiopulmonary disease.   ED Course:  Chest x-ray without infiltrate-does not show findings consistent with pneumonia or acute chest syndrome.  Patient not hypoxic, she has been compliant with her Xarelto, feel that acute PE is less likely.  Chest x-ray is additionally without findings of fluid overload or pneumothorax.  EKG without acute changes, troponins are flat, low suspicion for ACS.  Her labs overall appear fairly similar to prior on record.  Following second dose of analgesics patient complaining of severe pain, given third dose of Dilaudid and will reevaluate.  Following third dose of analgesics remains uncomfortable, she states she  does not feel she can go home, she remains in a significant amount of pain, will discuss with hospital service for admission.  04:30: CONSULT: Discussed with hospitalist Dr. Cyd Silence, will evaluate patient for admission.  Portions of this note were generated with [employer]. Dictation errors may occur despite best attempts at proofreading.  Final Clinical Impression(s)  / ED Diagnoses Final diagnoses:  Sickle cell anemia with pain ([place])  Chest pain, unspecified type    Rx / DC Orders ED Discharge Orders     None        Amaryllis Dyke, PA-C 04/25/21 0709    Ezequiel Essex, MD 04/25/21 [phone]

## 2021-04-25 ENCOUNTER — Encounter (HOSPITAL_COMMUNITY): Payer: Self-pay | Admitting: Internal Medicine

## 2021-04-25 DIAGNOSIS — Z8249 Family history of ischemic heart disease and other diseases of the circulatory system: Secondary | ICD-10-CM | POA: Diagnosis not present

## 2021-04-25 DIAGNOSIS — Z79899 Other long term (current) drug therapy: Secondary | ICD-10-CM | POA: Diagnosis not present

## 2021-04-25 DIAGNOSIS — Z20822 Contact with and (suspected) exposure to covid-19: Secondary | ICD-10-CM | POA: Diagnosis present

## 2021-04-25 DIAGNOSIS — Z83438 Family history of other disorder of lipoprotein metabolism and other lipidemia: Secondary | ICD-10-CM | POA: Diagnosis not present

## 2021-04-25 DIAGNOSIS — F1120 Opioid dependence, uncomplicated: Secondary | ICD-10-CM | POA: Diagnosis present

## 2021-04-25 DIAGNOSIS — Z7901 Long term (current) use of anticoagulants: Secondary | ICD-10-CM | POA: Diagnosis not present

## 2021-04-25 DIAGNOSIS — D72829 Elevated white blood cell count, unspecified: Secondary | ICD-10-CM | POA: Diagnosis not present

## 2021-04-25 DIAGNOSIS — Z9109 Other allergy status, other than to drugs and biological substances: Secondary | ICD-10-CM | POA: Diagnosis not present

## 2021-04-25 DIAGNOSIS — J069 Acute upper respiratory infection, unspecified: Secondary | ICD-10-CM | POA: Diagnosis present

## 2021-04-25 DIAGNOSIS — Z841 Family history of disorders of kidney and ureter: Secondary | ICD-10-CM | POA: Diagnosis not present

## 2021-04-25 DIAGNOSIS — Z7951 Long term (current) use of inhaled steroids: Secondary | ICD-10-CM | POA: Diagnosis not present

## 2021-04-25 DIAGNOSIS — Z833 Family history of diabetes mellitus: Secondary | ICD-10-CM | POA: Diagnosis not present

## 2021-04-25 DIAGNOSIS — Z86711 Personal history of pulmonary embolism: Secondary | ICD-10-CM | POA: Diagnosis not present

## 2021-04-25 DIAGNOSIS — D638 Anemia in other chronic diseases classified elsewhere: Secondary | ICD-10-CM | POA: Diagnosis present

## 2021-04-25 DIAGNOSIS — J4520 Mild intermittent asthma, uncomplicated: Secondary | ICD-10-CM | POA: Diagnosis present

## 2021-04-25 DIAGNOSIS — Z888 Allergy status to other drugs, medicaments and biological substances status: Secondary | ICD-10-CM | POA: Diagnosis not present

## 2021-04-25 DIAGNOSIS — D5700 Hb-SS disease with crisis, unspecified: Secondary | ICD-10-CM | POA: Diagnosis present

## 2021-04-25 DIAGNOSIS — G894 Chronic pain syndrome: Secondary | ICD-10-CM | POA: Diagnosis present

## 2021-04-25 LAB — COMPREHENSIVE METABOLIC PANEL
ALT: 17 U/L (ref 0–44)
AST: 36 U/L (ref 15–41)
Albumin: 4.7 g/dL (ref 3.5–5.0)
Alkaline Phosphatase: 54 U/L (ref 38–126)
Anion gap: 6 (ref 5–15)
BUN: 11 mg/dL (ref 6–20)
CO2: 23 mmol/L (ref 22–32)
Calcium: 8.9 mg/dL (ref 8.9–10.3)
Chloride: 105 mmol/L (ref 98–111)
Creatinine, Ser: 0.53 mg/dL (ref 0.44–1.00)
GFR, Estimated: >60 mL/min (ref >60)
Glucose, Bld: 99 mg/dL (ref 70–99)
Potassium: 3.6 mmol/L (ref 3.5–5.1)
Sodium: 134 mmol/L — ABNORMAL LOW (ref 135–145)
Total Bilirubin: 3.1 mg/dL — ABNORMAL HIGH (ref 0.3–1.2)
Total Protein: 8.4 g/dL — ABNORMAL HIGH (ref 6.5–8.1)

## 2021-04-25 LAB — URINALYSIS, ROUTINE W REFLEX MICROSCOPIC
Bilirubin Urine: NEGATIVE
Glucose, UA: NEGATIVE mg/dL
Hgb urine dipstick: NEGATIVE
Ketones, ur: NEGATIVE mg/dL
Nitrite: NEGATIVE
Protein, ur: 30 mg/dL — AB
Specific Gravity, Urine: 1.012 (ref 1.005–1.030)
pH: 6 (ref 5.0–8.0)

## 2021-04-25 LAB — RESPIRATORY PANEL BY PCR

## 2021-04-25 LAB — CBC WITH DIFFERENTIAL/PLATELET
Abs Immature Granulocytes: 0.09 10*3/uL — ABNORMAL HIGH (ref 0.00–0.07)
Basophils Absolute: 0.1 10*3/uL (ref 0.0–0.1)
Basophils Relative: 0 %
Eosinophils Absolute: 0.1 10*3/uL (ref 0.0–0.5)
Eosinophils Relative: 1 %
HCT: 21.9 % — ABNORMAL LOW (ref 36.0–46.0)
Hemoglobin: 7.8 g/dL — ABNORMAL LOW (ref 12.0–15.0)
Immature Granulocytes: 1 %
Lymphocytes Relative: 18 %
Lymphs Abs: 3.1 10*3/uL (ref 0.7–4.0)
MCH: 31.8 pg (ref 26.0–34.0)
MCHC: 35.6 g/dL (ref 30.0–36.0)
MCV: 89.4 fL (ref 80.0–100.0)
Monocytes Absolute: 2.3 10*3/uL — ABNORMAL HIGH (ref 0.1–1.0)
Monocytes Relative: 13 %
Neutro Abs: 11.4 10*3/uL — ABNORMAL HIGH (ref 1.7–7.7)
Neutrophils Relative %: 67 %
Platelets: 557 10*3/uL — ABNORMAL HIGH (ref 150–400)
RBC: 2.45 MIL/uL — ABNORMAL LOW (ref 3.87–5.11)
RDW: 21.2 % — ABNORMAL HIGH (ref 11.5–15.5)
WBC: 17.1 10*3/uL — ABNORMAL HIGH (ref 4.0–10.5)
nRBC: 0.5 % — ABNORMAL HIGH (ref 0.0–0.2)

## 2021-04-25 LAB — LIPASE, BLOOD: Lipase: 38 U/L (ref 11–51)

## 2021-04-25 LAB — RETICULOCYTES
Immature Retic Fract: 20.8 % — ABNORMAL HIGH (ref 2.3–15.9)
RBC.: 2.43 MIL/uL — ABNORMAL LOW (ref 3.87–5.11)
Retic Count, Absolute: 330 10*3/uL — ABNORMAL HIGH (ref 19.0–186.0)
Retic Ct Pct: 13.6 % — ABNORMAL HIGH (ref 0.4–3.1)

## 2021-04-25 LAB — RESP PANEL BY RT-PCR (FLU A&B, COVID) ARPGX2
Influenza A by PCR: NEGATIVE
Influenza B by PCR: NEGATIVE
SARS Coronavirus 2 by RT PCR: NEGATIVE

## 2021-04-25 LAB — TROPONIN I (HIGH SENSITIVITY)
Troponin I (High Sensitivity): 2 ng/L (ref <18)
Troponin I (High Sensitivity): 3 ng/L (ref <18)

## 2021-04-25 LAB — GROUP A STREP BY PCR: Group A Strep by PCR: NOT DETECTED

## 2021-04-25 MED ORDER — SODIUM CHLORIDE 0.45 % IV SOLN: INTRAVENOUS | Status: DC

## 2021-04-25 MED ORDER — NALOXONE HCL 0.4 MG/ML IJ SOLN: 0.4000 mg | INTRAMUSCULAR | Status: DC | PRN

## 2021-04-25 MED ORDER — PANTOPRAZOLE SODIUM 40 MG PO TBEC
40.0000 mg | DELAYED_RELEASE_TABLET | Freq: Two times a day (BID) | ORAL | Status: DC
  Administered 2021-04-25 – 2021-04-28 (×6): 40 mg via ORAL
  Filled 2021-04-25 (×6): qty 1

## 2021-04-25 MED ORDER — ACETAMINOPHEN 650 MG RE SUPP: 650.0000 mg | Freq: Four times a day (QID) | RECTAL | Status: DC | PRN

## 2021-04-25 MED ORDER — HYDROMORPHONE HCL 2 MG/ML IJ SOLN
2.0000 mg | Freq: Once | INTRAMUSCULAR | Status: AC
  Administered 2021-04-25: 2 mg via INTRAVENOUS
  Filled 2021-04-25: qty 1

## 2021-04-25 MED ORDER — HYDROMORPHONE HCL 1 MG/ML IJ SOLN
1.0000 mg | INTRAMUSCULAR | Status: DC | PRN
  Administered 2021-04-25 (×3): 1 mg via INTRAVENOUS
  Filled 2021-04-25 (×3): qty 1

## 2021-04-25 MED ORDER — HYDROMORPHONE HCL 2 MG/ML IJ SOLN
2.0000 mg | INTRAMUSCULAR | Status: AC
  Administered 2021-04-25: 2 mg via INTRAVENOUS
  Filled 2021-04-25: qty 1

## 2021-04-25 MED ORDER — HYDROMORPHONE 1 MG/ML IV SOLN
INTRAVENOUS | Status: DC
  Administered 2021-04-25: 30 mg via INTRAVENOUS
  Administered 2021-04-25: 3 mg via INTRAVENOUS
  Administered 2021-04-26: 1.5 mg via INTRAVENOUS
  Administered 2021-04-26: 4 mg via INTRAVENOUS
  Administered 2021-04-26: 0 mg via INTRAVENOUS
  Administered 2021-04-26: 3.5 mg via INTRAVENOUS
  Filled 2021-04-25: qty 30

## 2021-04-25 MED ORDER — SODIUM CHLORIDE 0.9% FLUSH: 9.0000 mL | INTRAVENOUS | Status: DC | PRN

## 2021-04-25 MED ORDER — DEFERASIROX 360 MG PO TABS: 3.0000 | ORAL_TABLET | Freq: Every day | ORAL | Status: DC

## 2021-04-25 MED ORDER — MORPHINE SULFATE ER 15 MG PO TBCR
15.0000 mg | EXTENDED_RELEASE_TABLET | Freq: Two times a day (BID) | ORAL | Status: DC
  Administered 2021-04-25 – 2021-04-28 (×7): 15 mg via ORAL
  Filled 2021-04-25 (×7): qty 1

## 2021-04-25 MED ORDER — DIPHENHYDRAMINE HCL 50 MG/ML IJ SOLN
25.0000 mg | Freq: Once | INTRAMUSCULAR | Status: AC
  Administered 2021-04-25: 25 mg via INTRAVENOUS
  Filled 2021-04-25: qty 1

## 2021-04-25 MED ORDER — ONDANSETRON HCL 4 MG/2ML IJ SOLN
4.0000 mg | INTRAMUSCULAR | Status: DC | PRN
Start: 2021-04-25 — End: 2021-04-25

## 2021-04-25 MED ORDER — DIPHENHYDRAMINE HCL 25 MG PO CAPS
25.0000 mg | ORAL_CAPSULE | Freq: Three times a day (TID) | ORAL | Status: DC | PRN
  Administered 2021-04-25 (×2): 25 mg via ORAL
  Filled 2021-04-25 (×2): qty 1

## 2021-04-25 MED ORDER — KETOROLAC TROMETHAMINE 30 MG/ML IJ SOLN
30.0000 mg | Freq: Four times a day (QID) | INTRAMUSCULAR | Status: DC | PRN
  Administered 2021-04-25: 30 mg via INTRAVENOUS
  Filled 2021-04-25: qty 1

## 2021-04-25 MED ORDER — HYDROMORPHONE HCL 2 MG/ML IJ SOLN: 2.0000 mg | INTRAMUSCULAR | Status: DC | PRN

## 2021-04-25 MED ORDER — CHLORHEXIDINE GLUCONATE CLOTH 2 % EX PADS
6.0000 | MEDICATED_PAD | Freq: Every day | CUTANEOUS | Status: DC
  Administered 2021-04-25 – 2021-04-28 (×4): 6 via TOPICAL

## 2021-04-25 MED ORDER — POLYETHYLENE GLYCOL 3350 17 G PO PACK: 17.0000 g | PACK | Freq: Every day | ORAL | Status: DC | PRN

## 2021-04-25 MED ORDER — SODIUM CHLORIDE 0.9 % IV SOLN
12.5000 mg | Freq: Four times a day (QID) | INTRAVENOUS | Status: DC | PRN
  Administered 2021-04-25 – 2021-04-27 (×6): 12.5 mg via INTRAVENOUS
  Filled 2021-04-25 (×5): qty 12.5
  Filled 2021-04-25 (×2): qty 0.5

## 2021-04-25 MED ORDER — MIRTAZAPINE 30 MG PO TABS
45.0000 mg | ORAL_TABLET | Freq: Every day | ORAL | Status: DC
  Administered 2021-04-25 – 2021-04-27 (×3): 45 mg via ORAL
  Filled 2021-04-25 (×3): qty 1

## 2021-04-25 MED ORDER — FLUTICASONE PROPIONATE 50 MCG/ACT NA SUSP
1.0000 | Freq: Every day | NASAL | Status: DC | PRN
  Filled 2021-04-25: qty 16

## 2021-04-25 MED ORDER — ACETAMINOPHEN 325 MG PO TABS: 650.0000 mg | ORAL_TABLET | Freq: Four times a day (QID) | ORAL | Status: DC | PRN

## 2021-04-25 MED ORDER — VOXELOTOR 500 MG PO TABS: 1500.0000 mg | ORAL_TABLET | Freq: Every day | ORAL | Status: DC

## 2021-04-25 MED ORDER — MOMETASONE FURO-FORMOTEROL FUM 100-5 MCG/ACT IN AERO
2.0000 | INHALATION_SPRAY | Freq: Every day | RESPIRATORY_TRACT | Status: DC
  Administered 2021-04-25 – 2021-04-27 (×3): 2 via RESPIRATORY_TRACT
  Filled 2021-04-25: qty 8.8

## 2021-04-25 MED ORDER — DIPHENHYDRAMINE HCL 25 MG PO CAPS
25.0000 mg | ORAL_CAPSULE | Freq: Once | ORAL | Status: AC
  Administered 2021-04-25: 25 mg via ORAL
  Filled 2021-04-25: qty 1

## 2021-04-25 MED ORDER — HYDROMORPHONE HCL 1 MG/ML IJ SOLN
1.0000 mg | INTRAMUSCULAR | Status: DC | PRN
  Filled 2021-04-25: qty 1

## 2021-04-25 MED ORDER — RIVAROXABAN 20 MG PO TABS
20.0000 mg | ORAL_TABLET | Freq: Every day | ORAL | Status: DC
  Administered 2021-04-25 – 2021-04-27 (×3): 20 mg via ORAL
  Filled 2021-04-25 (×4): qty 1

## 2021-04-25 MED ORDER — HYDROMORPHONE HCL 1 MG/ML IJ SOLN
1.0000 mg | INTRAMUSCULAR | Status: DC | PRN
  Administered 2021-04-25: 1 mg via INTRAVENOUS
  Filled 2021-04-25: qty 1

## 2021-04-25 MED ORDER — KETOROLAC TROMETHAMINE 30 MG/ML IJ SOLN
15.0000 mg | Freq: Four times a day (QID) | INTRAMUSCULAR | Status: DC
  Administered 2021-04-25 – 2021-04-28 (×12): 15 mg via INTRAVENOUS
  Filled 2021-04-25 (×12): qty 1

## 2021-04-25 MED ORDER — ALBUTEROL SULFATE (2.5 MG/3ML) 0.083% IN NEBU: 2.5000 mg | INHALATION_SOLUTION | RESPIRATORY_TRACT | Status: DC | PRN

## 2021-04-25 MED ORDER — LACTATED RINGERS IV BOLUS
500.0000 mL | Freq: Once | INTRAVENOUS | Status: AC
  Administered 2021-04-25: 500 mL via INTRAVENOUS

## 2021-04-25 NOTE — ED Notes (Signed)
Pt requesting nausea medication. Pharmacy paged and aware to send Phenergan drip, as per provider's written orders.

## 2021-04-25 NOTE — ED Notes (Signed)
Pt A&O x4. Attached to cardiac monitor x3. VSS.

## 2021-04-25 NOTE — Progress Notes (Signed)
Unable to give pt her Deferasirox tabs 1080mg  or her Oxbryta tabs 1500mg ; per Rx pt may use home meds however pt lives in [place], and that's where her meds are. Pt says she is not sure if anyone will be able to bring them.

## 2021-04-25 NOTE — Assessment & Plan Note (Signed)
   Continue home regimen of Xarelto 

## 2021-04-25 NOTE — Assessment & Plan Note (Signed)
?   No evidence of acute asthma exacerbation ?? As needed bronchodilator therapy for shortness of breath and wheezing. ? ?

## 2021-04-25 NOTE — Assessment & Plan Note (Signed)
   Known history of chronic leukocytosis

## 2021-04-25 NOTE — Progress Notes (Signed)
Molly Gutierrez is a 40 year old female with a medical history significant for sickle cell disease, chronic pain syndrome, opiate dependence and tolerance, history of PE on Xarelto, and mild intermittent asthma was admitted overnight and sickle cell pain crisis. Patient continues to complain of pain primarily to central chest, low back, and lower extremities.  She rates her pain as 10/10.   Care plan: Initiate IV Dilaudid PCA Decrease Toradol to 15 mg every 6 hours Decrease IV fluids to KVO Repeat CBC and CMP in AM.   Will reassess in am.    [name]  APRN, MSN, FNP-C [facility] [address] [place], [place] [address] [phone]

## 2021-04-25 NOTE — Plan of Care (Signed)

## 2021-04-25 NOTE — Assessment & Plan Note (Signed)
   Ongoing viral illness is likely the cause of patient's current sickle cell crisis.  COVID-19 PCR and influenza PCR testing negative  Chest x-ray reveals no evidence of infiltrate  Obtaining strep screen  Obtaining respiratory PCR swab  Clinical concern for possible RSV as the cause of patient's ongoing symptoms

## 2021-04-25 NOTE — Assessment & Plan Note (Signed)
   Patient presenting with pain consistent with previous sickle cell pain crises  Patient complains of chest pain this is typical of her usual sickle cell crises.  Patient has no associated hypoxia or lung infiltrate on chest x-ray to suggest acute chest syndrome.  As needed intravenous opiate-based analgesics ordered.  Once patient gets a bed on the medical floor PCA pump can be ordered.    Additionally providing patient with as needed Toradol  Patient will be initiated on intravenous while resuscitation with half-normal saline  Obtaining urinalysis, chest x-ray and COVID-19 testing to evaluate for etiology of the onset of this crisis  Monitoring progression of disease with serial CBCs, serial reticulocyte counts, serial bilirubins

## 2021-04-25 NOTE — H&P (Signed)
History and Physical    [facility] BOF:[id number] DOB: 04-29-1981 DOA: 04/24/2021  PCP: Vevelyn Francois, NP  Patient coming from: Home   Chief Complaint:  Chief Complaint  Patient presents with   Sickle Cell Pain Crisis     HPI:    40 year old female with past medical history of sickle cell anemia, history of pulmonary embolism on Xarelto, asthma who presents to [facility] emergency department with complaints of chest and abdominal pain.  Patient explains that 1 week ago she developed what she describes as a "cold" with nasal congestion and cough productive with green sputum.  Patient symptoms persisted over the course of the next week for her cough and congestion did subside somewhat by Saturday and Sunday she began to experience chest and abdominal pain.  Patient explains that this chest and abdominal pain are consistent with her usual sickle cell crises.  The days that followed patient's chest and abdominal pain became progressively more severe.  Patient describes her pain as "throbbing", nonradiating and worse with any movement whatsoever.  Patient complains of associated generalized weakness and poor appetite.  Patient denies fevers, nausea, vomiting, diarrhea.  Patient denies recent travel sick contacts or contact with confirmed COVID-19 infection.  Patient symptoms continued to worsen until she eventually presented to [facility] emergency department for evaluation.  Upon evaluation in the emergency department patient was felt to be exhibiting symptoms consistent with acute sickle cell crisis.  COVID-19 PCR and influenza testing were negative.  Patient initiated on intravenous fluids as well as several doses of intravenous Dilaudid.  Due to continued intractable pain the hospitalist group was then called to assess the patient for admission to the hospital.    Review of Systems:   Review of Systems  Cardiovascular:  Positive for chest pain.   Gastrointestinal:  Positive for abdominal pain.  Neurological:  Positive for weakness.  All other systems reviewed and are negative.  Past Medical History:  Diagnosis Date   Asthma    Eczema    History of pulmonary embolus (PE)    Sickle cell anemia ([place])     Past Surgical History:  Procedure Laterality Date   CHOLECYSTECTOMY     ERCP     JOINT REPLACEMENT     PORTA CATH INSERTION     TUBAL LIGATION     WISDOM TOOTH EXTRACTION       reports that she has never smoked. She has never used smokeless tobacco. She reports that she does not drink alcohol and does not use drugs.  Allergies  Allergen Reactions   Cefaclor Hives and Swelling   Hydroxyurea Palpitations and Other (See Comments)    Lowers "blood levels" and heart rate (causes HYPOtension); "it messes me up, it drops my levels and stuff"    Omeprazole Anaphylaxis and Other (See Comments)    Causes "sharp pains in the stomach"   Tomato Other (See Comments)    Break out    Ketamine Palpitations and Other (See Comments)    "Pt states she has had previous reaction to ketamine. States she becomes flushed, heart races, dizzy, and feels like she is going to pass out."    Family History  Problem Relation Age of Onset   Renal Disease Mother    Hypertension Mother    High Cholesterol Mother    Heart attack Mother    Diabetes Brother      Prior to Admission medications   Medication Sig Start Date End Date Taking? Authorizing  Provider  albuterol (VENTOLIN HFA) 108 (90 Base) MCG/ACT inhaler Inhale 2 puffs into the lungs 2 (two) times daily as needed for wheezing or shortness of breath. 04/13/20 04/25/21 Yes Vevelyn Francois, NP  celecoxib (CELEBREX) 200 MG capsule Take 200 mg by mouth daily as needed for moderate pain or mild pain. 02/09/21  Yes [provider]  Deferasirox 360 MG TABS Take 3 tablets by mouth at bedtime. 04/23/21  Yes [provider]  diphenhydrAMINE (BENADRYL) 25 mg capsule Take 25 mg by  mouth 3 (three) times daily as needed for itching.   Yes [provider]  fluticasone (FLONASE) 50 MCG/ACT nasal spray Place 1 spray into both nostrils daily as needed for allergies or rhinitis.   Yes [provider]  HYDROmorphone (DILAUDID) 4 MG tablet Take 1 tablet (4 mg total) by mouth every 6 (six) hours as needed for up to 15 days for moderate pain or severe pain. 04/10/21 04/25/21 Yes Vevelyn Francois, NP  mirtazapine (REMERON) 45 MG tablet Take 1 tablet (45 mg total) by mouth at bedtime. 04/20/21 04/20/22 Yes King, Diona Foley, NP  mometasone-formoterol (DULERA) 100-5 MCG/ACT AERO Inhale 2 puffs into the lungs daily as needed for wheezing or shortness of breath. 04/13/20 04/25/21 Yes King, Diona Foley, NP  morphine (MS CONTIN) 15 MG 12 hr tablet Take 1 tablet (15 mg total) by mouth every 12 (twelve) hours. Must last 30 days. 04/04/21 05/04/21 Yes Vevelyn Francois, NP  omeprazole (PRILOSEC) 20 MG capsule Take 1 capsule (20 mg total) by mouth 2 (two) times daily before a meal. 04/23/21 04/23/22 Yes King, Diona Foley, NP  polyethylene glycol (MIRALAX / GLYCOLAX) 17 g packet Take 17 g by mouth daily as needed for mild constipation.   Yes [provider]  promethazine (PHENERGAN) 25 MG tablet Take 0.5-1 tablets (12.5-25 mg total) by mouth every 6 (six) hours as needed for nausea or vomiting. 10/25/20 10/25/21 Yes King, Diona Foley, NP  voxelotor (OXBRYTA) 500 MG TABS tablet Take 1,500 mg by mouth at bedtime.   Yes [provider]  XARELTO 20 MG TABS tablet Take 1 tablet (20 mg total) by mouth daily with supper. 04/20/21 04/20/22 Yes Vevelyn Francois, NP    Physical Exam: Vitals:   04/25/21 0450 04/25/21 0500 04/25/21 0530 04/25/21 0600  BP: 97/68 107/74 107/79 99/83  Pulse: 95 98 95 88  Resp: 15 19 13 16   Temp:      TempSrc:      SpO2: 96% 93% 96% 96%  Weight: 52.3 kg       Constitutional: Awake alert and oriented x3, patient is in mild distress due to pain  skin: no  rashes, no lesions, poor dry skin turgor noted. Eyes: Pupils are equally reactive to light.  No evidence of scleral icterus or conjunctival pallor.  ENMT: Dry mucous membranes noted.  Posterior pharynx clear of any exudate or lesions.   Neck: normal, supple, no masses, no thyromegaly.  No evidence of jugular venous distension.   Respiratory: Scattered rhonchi bilaterally.  No significant wheezing noted.  Normal respiratory effort. No accessory muscle use.  Cardiovascular: Regular rate and rhythm, no murmurs / rubs / gallops. No extremity edema. 2+ pedal pulses. No carotid bruits.  Chest:   Diffuse anterior chest wall tenderness without crepitus or deformity.   Back:   Nontender without crepitus or deformity. Abdomen: Diffuse abdominal tenderness.  Abdomen is soft.  No evidence of intra-abdominal masses.  Positive bowel sounds noted in all  quadrants.   Musculoskeletal: No joint deformity upper and lower extremities. Good ROM, no contractures. Normal muscle tone.  Neurologic: CN 2-12 grossly intact. Sensation intact.  Patient moving all 4 extremities spontaneously.  Patient is following all commands.  Patient is responsive to verbal stimuli.   Psychiatric: Patient exhibits normal mood with appropriate affect.  Patient seems to possess insight as to their current situation.     Labs on Admission: I have personally reviewed following labs and imaging studies -   CBC: Recent Labs  Lab 04/25/21 0128  WBC 17.1*  NEUTROABS 11.4*  HGB 7.8*  HCT 21.9*  MCV 89.4  PLT 354*   Basic Metabolic Panel: Recent Labs  Lab 04/25/21 0128  NA 134*  K 3.6  CL 105  CO2 23  GLUCOSE 99  BUN 11  CREATININE 0.53  CALCIUM 8.9   GFR: Estimated Creatinine Clearance: 78 mL/min (by C-G formula based on SCr of 0.53 mg/dL). Liver Function Tests: Recent Labs  Lab 04/25/21 0128  AST 36  ALT 17  ALKPHOS 54  BILITOT 3.1*  PROT 8.4*  ALBUMIN 4.7   Recent Labs  Lab 04/25/21 0128  LIPASE 38   No  results for input(s): AMMONIA in the last 168 hours. Coagulation Profile: No results for input(s): INR, PROTIME in the last 168 hours. Cardiac Enzymes: No results for input(s): CKTOTAL, CKMB, CKMBINDEX, TROPONINI in the last 168 hours. BNP (last 3 results) No results for input(s): PROBNP in the last 8760 hours. HbA1C: No results for input(s): HGBA1C in the last 72 hours. CBG: No results for input(s): GLUCAP in the last 168 hours. Lipid Profile: No results for input(s): CHOL, HDL, LDLCALC, TRIG, CHOLHDL, LDLDIRECT in the last 72 hours. Thyroid Function Tests: No results for input(s): TSH, T4TOTAL, FREET4, T3FREE, THYROIDAB in the last 72 hours. Anemia Panel: Recent Labs    04/25/21 0128  RETICCTPCT 13.6*   Urine analysis:    Component Value Date/Time   COLORURINE YELLOW 04/25/2021 0152   APPEARANCEUR HAZY (A) 04/25/2021 0152   LABSPEC 1.012 04/25/2021 0152   PHURINE 6.0 04/25/2021 0152   GLUCOSEU NEGATIVE 04/25/2021 0152   HGBUR NEGATIVE 04/25/2021 0152   BILIRUBINUR NEGATIVE 04/25/2021 0152   BILIRUBINUR neg 08/03/2020 1429   KETONESUR NEGATIVE 04/25/2021 0152   PROTEINUR 30 (A) 04/25/2021 0152   UROBILINOGEN 1.0 08/03/2020 1429   NITRITE NEGATIVE 04/25/2021 0152   LEUKOCYTESUR TRACE (A) 04/25/2021 0152    Radiological Exams on Admission - Personally Reviewed: DG Chest 2 View  Result Date: 04/25/2021 CLINICAL DATA:  Chest pain, abdominal pain. EXAM: CHEST - 2 VIEW COMPARISON:  Chest x-ray 03/17/2021, CT angiography chest 03/17/2021 FINDINGS: Right chest wall Port-A-Cath with tip overlying the expected region of the superior cavoatrial junction. Left chest wall central venous catheter with tip overlying the right atrium. The heart and mediastinal contours are unchanged. No focal consolidation. No pulmonary edema. No pleural effusion. No pneumothorax. No acute osseous abnormality. IMPRESSION: No active cardiopulmonary disease. Electronically Signed   By: Iven Finn M.D.    On: 04/25/2021 00:02    EKG: Personally reviewed.  Rhythm is normal sinus rhythm with heart rate of 90 bpm.  No dynamic ST segment changes appreciated.  Assessment/Plan  * Sickle cell crisis [facility]) Patient presenting with pain consistent with previous sickle cell pain crises Patient complains of chest pain this is typical of her usual sickle cell crises.  Patient has no associated hypoxia or lung infiltrate on chest x-ray to suggest acute chest syndrome. As  needed intravenous opiate-based analgesics ordered.  Once patient gets a bed on the medical floor PCA pump can be ordered.   Additionally providing patient with as needed Toradol Patient will be initiated on intravenous while resuscitation with half-normal saline Obtaining urinalysis, chest x-ray and COVID-19 testing to evaluate for etiology of the onset of this crisis Monitoring progression of disease with serial CBCs, serial reticulocyte counts, serial bilirubins   Upper respiratory infection, acute Ongoing viral illness is likely the cause of patient's current sickle cell crisis. COVID-19 PCR and influenza PCR testing negative Chest x-ray reveals no evidence of infiltrate Obtaining strep screen Obtaining respiratory PCR swab Clinical concern for possible RSV as the cause of patient's ongoing symptoms  Leukocytosis Known history of chronic leukocytosis  History of pulmonary embolism Continue home regimen of Xarelto  Mild intermittent asthma without complication No evidence of acute asthma exacerbation As needed bronchodilator therapy for shortness of breath and wheezing.       Code Status:  Full code  code status decision has been confirmed with: patient Family Communication: deferred   Status is: Inpatient  Remains inpatient appropriate because:  Acute sickle cell crisis requiring high doses of intravenous opiate-based analgesics with close home monitoring on telemetry with serial clinical assessments         Vernelle Emerald MD [facility] Pager [phone]  If 7PM-7AM, please contact night-coverage [email or web address] Use universal [place] password for that web site. If you do not have the password, please call the hospital operator.  04/25/2021, 7:43 AM

## 2021-04-25 NOTE — ED Notes (Signed)
Patient began vomitting. Will message Dr. She declined zofran.

## 2021-04-26 DIAGNOSIS — D5700 Hb-SS disease with crisis, unspecified: Principal | ICD-10-CM

## 2021-04-26 MED ORDER — HYDROMORPHONE 1 MG/ML IV SOLN
INTRAVENOUS | Status: DC
  Administered 2021-04-27: 0 mg via INTRAVENOUS
  Administered 2021-04-27: 30 mg via INTRAVENOUS
  Filled 2021-04-26: qty 30

## 2021-04-26 MED ORDER — HYDROMORPHONE HCL 4 MG PO TABS: 4.0000 mg | ORAL_TABLET | Freq: Four times a day (QID) | ORAL | Status: DC | PRN

## 2021-04-26 NOTE — Progress Notes (Signed)
Subjective: Molly Gutierrez is a 40 year old female with a medical history significant for sickle cell disease, chronic pain syndrome, opiate dependence and tolerance, history of PE on Xarelto, and history of mild intermittent asthma was admitted for sickle cell pain crisis.  Patient states that pain intensity has improved some overnight.  She rates her pain as 7/10 primarily to chest, low back, and lower extremities.  Patient denies any shortness of breath, dizziness, urinary symptoms, nausea, vomiting, or diarrhea.  Objective:  Vital signs in last 24 hours:  Vitals:   04/26/21 0444 04/26/21 0727 04/26/21 1041 04/26/21 1144  BP: 105/75  108/70   Pulse: 97  96   Resp: 16 16 14 14   Temp: 98.8 F (37.1 C)  98.9 F (37.2 C)   TempSrc: Oral  Oral   SpO2: 92% 92% 92% 92%  Weight:      Height:        Intake/Output from previous day:   Intake/Output Summary (Last 24 hours) at 04/26/2021 1203 Last data filed at 04/26/2021 1059 Gross per 24 hour  Intake 1429.53 ml  Output --  Net 1429.53 ml    Physical Exam: General: Alert, awake, oriented x3, in no acute distress.  HEENT: [place]/AT PEERL, EOMI Neck: Trachea midline,  no masses, no thyromegal,y no JVD, no carotid bruit OROPHARYNX:  Moist, No exudate/ erythema/lesions.  Heart: Regular rate and rhythm, without murmurs, rubs, gallops, PMI non-displaced, no heaves or thrills on palpation.  Lungs: Clear to auscultation, no wheezing or rhonchi noted. No increased vocal fremitus resonant to percussion  Abdomen: Soft, nontender, nondistended, positive bowel sounds, no masses no hepatosplenomegaly noted..  Neuro: No focal neurological deficits noted cranial nerves II through XII grossly intact. DTRs 2+ bilaterally upper and lower extremities. Strength 5 out of 5 in bilateral upper and lower extremities. Musculoskeletal: No warm swelling or erythema around joints, no spinal tenderness noted. Psychiatric: Patient alert and oriented x3, good  insight and cognition, good recent to remote recall. Lymph node survey: No cervical axillary or inguinal lymphadenopathy noted.  Lab Results:  Basic Metabolic Panel:    Component Value Date/Time   NA 134 (L) 04/25/2021 0128   NA 137 08/02/2020 1538   K 3.6 04/25/2021 0128   CL 105 04/25/2021 0128   CO2 23 04/25/2021 0128   BUN 11 04/25/2021 0128   BUN 7 08/02/2020 1538   CREATININE 0.53 04/25/2021 0128   GLUCOSE 99 04/25/2021 0128   CALCIUM 8.9 04/25/2021 0128   CBC:    Component Value Date/Time   WBC 17.1 (H) 04/25/2021 0128   HGB 7.8 (L) 04/25/2021 0128   HGB 7.8 (L) 08/02/2020 1538   HCT 21.9 (L) 04/25/2021 0128   HCT 22.3 (L) 08/02/2020 1538   PLT 557 (H) 04/25/2021 0128   PLT 560 (H) 08/02/2020 1538   MCV 89.4 04/25/2021 0128   MCV 92 08/02/2020 1538   NEUTROABS 11.4 (H) 04/25/2021 0128   NEUTROABS 9.5 (H) 08/02/2020 1538   LYMPHSABS 3.1 04/25/2021 0128   LYMPHSABS 3.0 08/02/2020 1538   MONOABS 2.3 (H) 04/25/2021 0128   EOSABS 0.1 04/25/2021 0128   EOSABS 0.4 08/02/2020 1538   BASOSABS 0.1 04/25/2021 0128   BASOSABS 0.1 08/02/2020 1538    Recent Results (from the past 240 hour(s))  Resp Panel by RT-PCR (Flu A&B, Covid) Nasopharyngeal Swab     Status: None   Collection Time: 04/25/21  1:28 AM   Specimen: Nasopharyngeal Swab; Nasopharyngeal(NP) swabs in vial transport medium  Result Value Ref Range  Status   SARS Coronavirus 2 by RT PCR NEGATIVE NEGATIVE Final    Comment: (NOTE) SARS-CoV-2 target nucleic acids are NOT DETECTED.  The SARS-CoV-2 RNA is generally detectable in upper respiratory specimens during the acute phase of infection. The lowest concentration of SARS-CoV-2 viral copies this assay can detect is 138 copies/mL. A negative result does not preclude SARS-Cov-2 infection and should not be used as the sole basis for treatment or other patient management decisions. A negative result may occur with  improper specimen collection/handling, submission  of specimen other than nasopharyngeal swab, presence of viral mutation(s) within the areas targeted by this assay, and inadequate number of viral copies(<138 copies/mL). A negative result must be combined with clinical observations, patient history, and epidemiological information. The expected result is Negative.  Fact Sheet for Patients:  EntrepreneurPulse.com.au  Fact Sheet for Healthcare Providers:  IncredibleEmployment.be  This test is no t yet approved or cleared by the [place] FDA and  has been authorized for detection and/or diagnosis of SARS-CoV-2 by FDA under an Emergency Use Authorization (EUA). This EUA will remain  in effect (meaning this test can be used) for the duration of the COVID-19 declaration under Section 564(b)(1) of the Act, 21 U.S.C.section 360bbb-3(b)(1), unless the authorization is terminated  or revoked sooner.       Influenza A by PCR NEGATIVE NEGATIVE Final   Influenza B by PCR NEGATIVE NEGATIVE Final    Comment: (NOTE) The Xpert Xpress SARS-CoV-2/FLU/RSV plus assay is intended as an aid in the diagnosis of influenza from Nasopharyngeal swab specimens and should not be used as a sole basis for treatment. Nasal washings and aspirates are unacceptable for Xpert Xpress SARS-CoV-2/FLU/RSV testing.  Fact Sheet for Patients: EntrepreneurPulse.com.au  Fact Sheet for Healthcare Providers: IncredibleEmployment.be  This test is not yet approved or cleared by the [place] FDA and has been authorized for detection and/or diagnosis of SARS-CoV-2 by FDA under an Emergency Use Authorization (EUA). This EUA will remain in effect (meaning this test can be used) for the duration of the COVID-19 declaration under Section 564(b)(1) of the Act, 21 U.S.C. section 360bbb-3(b)(1), unless the authorization is terminated or revoked.  Performed at [facility], [place]  [address] [place], [place] [address]   Group A Strep by PCR     Status: None   Collection Time: 04/25/21  6:28 AM   Specimen: Throat; Sterile Swab  Result Value Ref Range Status   Group A Strep by PCR NOT DETECTED NOT DETECTED Final    Comment: Performed at [facility], [place] [address], [place], [place] [address]  Respiratory (~20 pathogens) panel by PCR     Status: None   Collection Time: 04/25/21  6:29 AM   Specimen: Throat; Respiratory  Result Value Ref Range Status   Adenovirus NOT DETECTED NOT DETECTED Final   Coronavirus 229E NOT DETECTED NOT DETECTED Final    Comment: (NOTE) The Coronavirus on the Respiratory Panel, DOES NOT test for the novel  Coronavirus (2019 nCoV)    Coronavirus HKU1 NOT DETECTED NOT DETECTED Final   Coronavirus NL63 NOT DETECTED NOT DETECTED Final   Coronavirus OC43 NOT DETECTED NOT DETECTED Final   Metapneumovirus NOT DETECTED NOT DETECTED Final   Rhinovirus / Enterovirus NOT DETECTED NOT DETECTED Final   Influenza A NOT DETECTED NOT DETECTED Final   Influenza B NOT DETECTED NOT DETECTED Final   Parainfluenza Virus 1 NOT DETECTED NOT DETECTED Final   Parainfluenza Virus 2 NOT DETECTED NOT  DETECTED Final   Parainfluenza Virus 3 NOT DETECTED NOT DETECTED Final   Parainfluenza Virus 4 NOT DETECTED NOT DETECTED Final   Respiratory Syncytial Virus NOT DETECTED NOT DETECTED Final   Bordetella pertussis NOT DETECTED NOT DETECTED Final   Bordetella Parapertussis NOT DETECTED NOT DETECTED Final   Chlamydophila pneumoniae NOT DETECTED NOT DETECTED Final   Mycoplasma pneumoniae NOT DETECTED NOT DETECTED Final    Comment: Performed at [facility], [place] [address], [place], [place] [address]    Studies/Results: DG Chest 2 View  Result Date: 04/25/2021 CLINICAL DATA:  Chest pain, abdominal pain. EXAM: CHEST - 2 VIEW COMPARISON:  Chest x-ray 03/17/2021, CT angiography chest 03/17/2021 FINDINGS: Right chest wall Port-A-Cath with tip  overlying the expected region of the superior cavoatrial junction. Left chest wall central venous catheter with tip overlying the right atrium. The heart and mediastinal contours are unchanged. No focal consolidation. No pulmonary edema. No pleural effusion. No pneumothorax. No acute osseous abnormality. IMPRESSION: No active cardiopulmonary disease. Electronically Signed   By: Iven Finn M.D.   On: 04/25/2021 00:02    Medications: Scheduled Meds:  Chlorhexidine Gluconate Cloth  6 each Topical Daily   Deferasirox  3 tablet Oral QHS   HYDROmorphone   Intravenous Q4H   ketorolac  15 mg Intravenous Q6H   mirtazapine  45 mg Oral QHS   mometasone-formoterol  2 puff Inhalation Daily   morphine  15 mg Oral Q12H   pantoprazole  40 mg Oral BID AC   rivaroxaban  20 mg Oral Q supper   voxelotor  1,500 mg Oral QHS   Continuous Infusions:  sodium chloride 100 mL/hr at 04/25/21 1644   promethazine (PHENERGAN) injection (IM or IVPB) 12.5 mg (04/26/21 1148)   PRN Meds:.acetaminophen **OR** acetaminophen, albuterol, diphenhydrAMINE, fluticasone, HYDROmorphone, naloxone **AND** sodium chloride flush, polyethylene glycol, promethazine (PHENERGAN) injection (IM or IVPB)  Consultants: none  Procedures: none  Antibiotics: none  Assessment/Plan: Principal Problem:   Sickle cell crisis ([place]) Active Problems:   Leukocytosis   History of pulmonary embolism   Mild intermittent asthma without complication   Upper respiratory infection, acute  Sickle cell disease with pain crisis: Weaning IV Dilaudid PCA IV fluids decreased to KVO Toradol 15 mg IV every 6 hours MS Contin 15 mg every 12 hours Dilaudid 4 mg every 6 hours as needed for severe breakthrough pain Monitor vital signs very closely, reevaluate pain scale regularly, and supplemental oxygen as needed.  Chronic pain syndrome: Continue home medications  History of PE: Continue Xarelto  History of anemia of chronic disease: Patient's  hemoglobin is stable and consistent with her baseline, there is no clinical indication for blood transfusion at this time. Code Status: Full Code Family Communication: N/A Disposition Plan: Not yet ready for discharge  [place], MSN, FNP-C [facility] [address] [place], [place] [address] [phone]  If 5PM-8AM, please contact night-coverage.  04/26/2021, 12:03 PM  LOS: 1 day

## 2021-04-27 DIAGNOSIS — D5700 Hb-SS disease with crisis, unspecified: Secondary | ICD-10-CM | POA: Diagnosis not present

## 2021-04-27 LAB — CBC
HCT: 16.8 % — ABNORMAL LOW (ref 36.0–46.0)
HCT: 22.1 % — ABNORMAL LOW (ref 36.0–46.0)
Hemoglobin: 6.2 g/dL — CL (ref 12.0–15.0)
Hemoglobin: 7.7 g/dL — ABNORMAL LOW (ref 12.0–15.0)
MCH: 32.3 pg (ref 26.0–34.0)
MCH: 31 pg (ref 26.0–34.0)
MCHC: 36.9 g/dL — ABNORMAL HIGH (ref 30.0–36.0)
MCHC: 34.8 g/dL (ref 30.0–36.0)
MCV: 87.5 fL (ref 80.0–100.0)
MCV: 89.1 fL (ref 80.0–100.0)
Platelets: 532 10*3/uL — ABNORMAL HIGH (ref 150–400)
Platelets: 558 10*3/uL — ABNORMAL HIGH (ref 150–400)
RBC: 1.92 MIL/uL — ABNORMAL LOW (ref 3.87–5.11)
RBC: 2.48 MIL/uL — ABNORMAL LOW (ref 3.87–5.11)
RDW: 22.1 % — ABNORMAL HIGH (ref 11.5–15.5)
RDW: 20.9 % — ABNORMAL HIGH (ref 11.5–15.5)
WBC: 18.1 10*3/uL — ABNORMAL HIGH (ref 4.0–10.5)
WBC: 15.1 10*3/uL — ABNORMAL HIGH (ref 4.0–10.5)
nRBC: 0.5 % — ABNORMAL HIGH (ref 0.0–0.2)
nRBC: 0.8 % — ABNORMAL HIGH (ref 0.0–0.2)

## 2021-04-27 LAB — BASIC METABOLIC PANEL
Anion gap: 4 — ABNORMAL LOW (ref 5–15)
BUN: 9 mg/dL (ref 6–20)
CO2: 24 mmol/L (ref 22–32)
Calcium: 8.2 mg/dL — ABNORMAL LOW (ref 8.9–10.3)
Chloride: 109 mmol/L (ref 98–111)
Creatinine, Ser: 0.57 mg/dL (ref 0.44–1.00)
GFR, Estimated: >60 mL/min (ref >60)
Glucose, Bld: 119 mg/dL — ABNORMAL HIGH (ref 70–99)
Potassium: 3.8 mmol/L (ref 3.5–5.1)
Sodium: 137 mmol/L (ref 135–145)

## 2021-04-27 LAB — PREPARE RBC (CROSSMATCH)

## 2021-04-27 MED ORDER — DIPHENHYDRAMINE HCL 50 MG/ML IJ SOLN
25.0000 mg | Freq: Once | INTRAMUSCULAR | Status: AC
  Administered 2021-04-27: 25 mg via INTRAVENOUS
  Filled 2021-04-27: qty 1

## 2021-04-27 MED ORDER — HYDROMORPHONE 1 MG/ML IV SOLN
INTRAVENOUS | Status: DC
  Administered 2021-04-28 (×2): 1 mg via INTRAVENOUS

## 2021-04-27 MED ORDER — GUAIFENESIN-DM 100-10 MG/5ML PO SYRP
5.0000 mL | ORAL_SOLUTION | ORAL | Status: DC | PRN
  Administered 2021-04-27 (×2): 5 mL via ORAL
  Filled 2021-04-27 (×2): qty 10

## 2021-04-27 MED ORDER — DIPHENHYDRAMINE HCL 50 MG/ML IJ SOLN
25.0000 mg | Freq: Three times a day (TID) | INTRAMUSCULAR | Status: AC | PRN
  Administered 2021-04-27 – 2021-04-28 (×2): 25 mg via INTRAVENOUS
  Filled 2021-04-27 (×2): qty 1

## 2021-04-27 MED ORDER — SODIUM CHLORIDE 0.9% IV SOLUTION: Freq: Once | INTRAVENOUS | Status: AC

## 2021-04-27 MED ORDER — ACETAMINOPHEN 325 MG PO TABS
650.0000 mg | ORAL_TABLET | Freq: Once | ORAL | Status: AC
  Administered 2021-04-27: 650 mg via ORAL
  Filled 2021-04-27: qty 2

## 2021-04-27 NOTE — Progress Notes (Signed)
Subjective: Molly Gutierrez is a 40 year old female with a medical history significant for sickle cell disease, chronic pain syndrome, opiate dependence and tolerance, history of PE on Xarelto, and history of mild intermittent asthma was admitted for sickle cell pain crisis.  Today, patient's hemoglobin is 6.2, which is below her baseline of 7-8 g/dL.  Patient states that pain intensity has improved some overnight.  She rates her pain as 7/10 primarily to chest, low back, and lower extremities.  Patient denies any shortness of breath, dizziness, urinary symptoms, nausea, vomiting, or diarrhea.  Objective:  Vital signs in last 24 hours:  Vitals:   04/27/21 0955 04/27/21 1017 04/27/21 1342 04/27/21 1419  BP: 110/78  113/83 99/72  Pulse: (!) 103  (!) 102 91  Resp: 14  15 15   Temp: 99.3 F (37.4 C)  99.2 F (37.3 C) 99.1 F (37.3 C)  TempSrc: Oral  Oral   SpO2: (!) 89% 91% 90% 93%  Weight:      Height:        Intake/Output from previous day:  No intake or output data in the 24 hours ending 04/27/21 1517   Physical Exam: General: Alert, awake, oriented x3, in no acute distress.  HEENT: [place]/AT PEERL, EOMI Neck: Trachea midline,  no masses, no thyromegal,y no JVD, no carotid bruit OROPHARYNX:  Moist, No exudate/ erythema/lesions.  Heart: Regular rate and rhythm, without murmurs, rubs, gallops, PMI non-displaced, no heaves or thrills on palpation.  Lungs: Clear to auscultation, no wheezing or rhonchi noted. No increased vocal fremitus resonant to percussion  Abdomen: Soft, nontender, nondistended, positive bowel sounds, no masses no hepatosplenomegaly noted..  Neuro: No focal neurological deficits noted cranial nerves II through XII grossly intact. DTRs 2+ bilaterally upper and lower extremities. Strength 5 out of 5 in bilateral upper and lower extremities. Musculoskeletal: No warm swelling or erythema around joints, no spinal tenderness noted. Psychiatric: Patient alert and  oriented x3, good insight and cognition, good recent to remote recall. Lymph node survey: No cervical axillary or inguinal lymphadenopathy noted.  Lab Results:  Basic Metabolic Panel:    Component Value Date/Time   NA 137 04/27/2021 0833   NA 137 08/02/2020 1538   K 3.8 04/27/2021 0833   CL 109 04/27/2021 0833   CO2 24 04/27/2021 0833   BUN 9 04/27/2021 0833   BUN 7 08/02/2020 1538   CREATININE 0.57 04/27/2021 0833   GLUCOSE 119 (H) 04/27/2021 0833   CALCIUM 8.2 (L) 04/27/2021 0833   CBC:    Component Value Date/Time   WBC 18.1 (H) 04/27/2021 0833   HGB 6.2 (LL) 04/27/2021 0833   HGB 7.8 (L) 08/02/2020 1538   HCT 16.8 (L) 04/27/2021 0833   HCT 22.3 (L) 08/02/2020 1538   PLT 532 (H) 04/27/2021 0833   PLT 560 (H) 08/02/2020 1538   MCV 87.5 04/27/2021 0833   MCV 92 08/02/2020 1538   NEUTROABS 11.4 (H) 04/25/2021 0128   NEUTROABS 9.5 (H) 08/02/2020 1538   LYMPHSABS 3.1 04/25/2021 0128   LYMPHSABS 3.0 08/02/2020 1538   MONOABS 2.3 (H) 04/25/2021 0128   EOSABS 0.1 04/25/2021 0128   EOSABS 0.4 08/02/2020 1538   BASOSABS 0.1 04/25/2021 0128   BASOSABS 0.1 08/02/2020 1538    Recent Results (from the past 240 hour(s))  Resp Panel by RT-PCR (Flu A&B, Covid) Nasopharyngeal Swab     Status: None   Collection Time: 04/25/21  1:28 AM   Specimen: Nasopharyngeal Swab; Nasopharyngeal(NP) swabs in vial transport medium  Result Value  Ref Range Status   SARS Coronavirus 2 by RT PCR NEGATIVE NEGATIVE Final    Comment: (NOTE) SARS-CoV-2 target nucleic acids are NOT DETECTED.  The SARS-CoV-2 RNA is generally detectable in upper respiratory specimens during the acute phase of infection. The lowest concentration of SARS-CoV-2 viral copies this assay can detect is 138 copies/mL. A negative result does not preclude SARS-Cov-2 infection and should not be used as the sole basis for treatment or other patient management decisions. A negative result may occur with  improper specimen  collection/handling, submission of specimen other than nasopharyngeal swab, presence of viral mutation(s) within the areas targeted by this assay, and inadequate number of viral copies(<138 copies/mL). A negative result must be combined with clinical observations, patient history, and epidemiological information. The expected result is Negative.  Fact Sheet for Patients:  EntrepreneurPulse.com.au  Fact Sheet for Healthcare Providers:  IncredibleEmployment.be  This test is no t yet approved or cleared by the [place] FDA and  has been authorized for detection and/or diagnosis of SARS-CoV-2 by FDA under an Emergency Use Authorization (EUA). This EUA will remain  in effect (meaning this test can be used) for the duration of the COVID-19 declaration under Section 564(b)(1) of the Act, 21 U.S.C.section 360bbb-3(b)(1), unless the authorization is terminated  or revoked sooner.       Influenza A by PCR NEGATIVE NEGATIVE Final   Influenza B by PCR NEGATIVE NEGATIVE Final    Comment: (NOTE) The Xpert Xpress SARS-CoV-2/FLU/RSV plus assay is intended as an aid in the diagnosis of influenza from Nasopharyngeal swab specimens and should not be used as a sole basis for treatment. Nasal washings and aspirates are unacceptable for Xpert Xpress SARS-CoV-2/FLU/RSV testing.  Fact Sheet for Patients: EntrepreneurPulse.com.au  Fact Sheet for Healthcare Providers: IncredibleEmployment.be  This test is not yet approved or cleared by the [place] FDA and has been authorized for detection and/or diagnosis of SARS-CoV-2 by FDA under an Emergency Use Authorization (EUA). This EUA will remain in effect (meaning this test can be used) for the duration of the COVID-19 declaration under Section 564(b)(1) of the Act, 21 U.S.C. section 360bbb-3(b)(1), unless the authorization is terminated or revoked.  Performed at [facility], [place] [address], [place], [place] [address]   Group A Strep by PCR     Status: None   Collection Time: 04/25/21  6:28 AM   Specimen: Throat; Sterile Swab  Result Value Ref Range Status   Group A Strep by PCR NOT DETECTED NOT DETECTED Final    Comment: Performed at [facility], [place] [address] [place], [place] [address]  Respiratory (~20 pathogens) panel by PCR     Status: None   Collection Time: 04/25/21  6:29 AM   Specimen: Throat; Respiratory  Result Value Ref Range Status   Adenovirus NOT DETECTED NOT DETECTED Final   Coronavirus 229E NOT DETECTED NOT DETECTED Final    Comment: (NOTE) The Coronavirus on the Respiratory Panel, DOES NOT test for the novel  Coronavirus (2019 nCoV)    Coronavirus HKU1 NOT DETECTED NOT DETECTED Final   Coronavirus NL63 NOT DETECTED NOT DETECTED Final   Coronavirus OC43 NOT DETECTED NOT DETECTED Final   Metapneumovirus NOT DETECTED NOT DETECTED Final   Rhinovirus / Enterovirus NOT DETECTED NOT DETECTED Final   Influenza A NOT DETECTED NOT DETECTED Final   Influenza B NOT DETECTED NOT DETECTED Final   Parainfluenza Virus 1 NOT DETECTED NOT DETECTED Final   Parainfluenza Virus 2 NOT  DETECTED NOT DETECTED Final   Parainfluenza Virus 3 NOT DETECTED NOT DETECTED Final   Parainfluenza Virus 4 NOT DETECTED NOT DETECTED Final   Respiratory Syncytial Virus NOT DETECTED NOT DETECTED Final   Bordetella pertussis NOT DETECTED NOT DETECTED Final   Bordetella Parapertussis NOT DETECTED NOT DETECTED Final   Chlamydophila pneumoniae NOT DETECTED NOT DETECTED Final   Mycoplasma pneumoniae NOT DETECTED NOT DETECTED Final    Comment: Performed at [facility], [place] [address] [place], [place] [address]    Studies/Results: No results found.  Medications: Scheduled Meds:  Chlorhexidine Gluconate Cloth  6 each Topical Daily   Deferasirox  3 tablet Oral QHS   HYDROmorphone   Intravenous Q4H   ketorolac  15 mg  Intravenous Q6H   mirtazapine  45 mg Oral QHS   mometasone-formoterol  2 puff Inhalation Daily   morphine  15 mg Oral Q12H   pantoprazole  40 mg Oral BID AC   rivaroxaban  20 mg Oral Q supper   voxelotor  1,500 mg Oral QHS   Continuous Infusions:  sodium chloride 10 mL/hr at 04/27/21 1135   promethazine (PHENERGAN) injection (IM or IVPB) 12.5 mg (04/27/21 1043)   PRN Meds:.acetaminophen **OR** acetaminophen, albuterol, diphenhydrAMINE, fluticasone, guaiFENesin-dextromethorphan, HYDROmorphone, naloxone **AND** sodium chloride flush, polyethylene glycol, promethazine (PHENERGAN) injection (IM or IVPB)  Consultants: none  Procedures: none  Antibiotics: none  Assessment/Plan: Principal Problem:   Sickle cell crisis ([place]) Active Problems:   Leukocytosis   History of pulmonary embolism   Mild intermittent asthma without complication   Upper respiratory infection, acute  Sickle cell disease with pain crisis: Weaning IV Dilaudid PCA IV fluids decreased to KVO Toradol 15 mg IV every 6 hours MS Contin 15 mg every 12 hours Dilaudid 4 mg every 6 hours as needed for severe breakthrough pain Monitor vital signs very closely, reevaluate pain scale regularly, and supplemental oxygen as needed.  Chronic pain syndrome: Continue home medications  History of PE: Continue Xarelto  History of anemia of chronic disease: Patient's hemoglobin is 6.2 g/dL, which is below her baseline.  Transfuse 1 unit PRBCs.  Follow labs in AM.  History of mild intermittent asthma: Stable.  Continue home medications as needed     Code Status: Full Code Family Communication: N/A Disposition Plan: Not yet ready for discharge  [place], MSN, FNP-C Patient [place],  [id number] [phone]  If 5PM-8AM, please contact night-coverage.  04/27/2021, 3:17 PM  LOS: 2 days

## 2021-04-27 NOTE — Plan of Care (Signed)

## 2021-04-27 NOTE — Progress Notes (Signed)
Date and time results received: 04/27/21 0857 (use smartphrase ".now" to insert current time)  Test: hgb   Critical Value: 6.2  Name of Provider Notified: [place] Hollis  Orders Received? Or Actions Taken?:  waiting on provider response.

## 2021-04-28 DIAGNOSIS — D571 Sickle-cell disease without crisis: Secondary | ICD-10-CM

## 2021-04-28 DIAGNOSIS — R079 Chest pain, unspecified: Secondary | ICD-10-CM | POA: Diagnosis not present

## 2021-04-28 DIAGNOSIS — J4520 Mild intermittent asthma, uncomplicated: Secondary | ICD-10-CM

## 2021-04-28 DIAGNOSIS — G894 Chronic pain syndrome: Secondary | ICD-10-CM

## 2021-04-28 DIAGNOSIS — J069 Acute upper respiratory infection, unspecified: Secondary | ICD-10-CM

## 2021-04-28 DIAGNOSIS — Z86711 Personal history of pulmonary embolism: Secondary | ICD-10-CM

## 2021-04-28 DIAGNOSIS — D5700 Hb-SS disease with crisis, unspecified: Secondary | ICD-10-CM | POA: Diagnosis not present

## 2021-04-28 DIAGNOSIS — D72829 Elevated white blood cell count, unspecified: Secondary | ICD-10-CM

## 2021-04-28 LAB — BPAM RBC
Blood Product Expiration Date: 202212052359
ISSUE DATE / TIME: 202211111357
Unit Type and Rh: 5100

## 2021-04-28 LAB — TYPE AND SCREEN
ABO/RH(D): A POS
Antibody Screen: NEGATIVE
Unit division: 0

## 2021-04-28 MED ORDER — HEPARIN SOD (PORK) LOCK FLUSH 100 UNIT/ML IV SOLN
500.0000 [IU] | Freq: Once | INTRAVENOUS | Status: AC
  Administered 2021-04-28: 500 [IU] via INTRAVENOUS
  Filled 2021-04-28: qty 5

## 2021-04-28 MED ORDER — HYDROMORPHONE HCL 4 MG PO TABS: 4.0000 mg | ORAL_TABLET | Freq: Four times a day (QID) | ORAL | 0 refills | Status: DC | PRN

## 2021-04-28 MED ORDER — MORPHINE SULFATE ER 15 MG PO TBCR
15.0000 mg | EXTENDED_RELEASE_TABLET | Freq: Two times a day (BID) | ORAL | 0 refills | Status: DC
Start: 2021-04-28 — End: 2021-05-30

## 2021-04-28 NOTE — Discharge Summary (Signed)
Physician Discharge Summary  [facility] GHW:[id number] DOB: November 18, 1980 DOA: 04/24/2021  PCP: Vevelyn Francois, NP  Admit date: 04/24/2021  Discharge date: 04/28/2021  Discharge Diagnoses:  Principal Problem:   Sickle cell crisis ([place]) Active Problems:   Leukocytosis   History of pulmonary embolism   Mild intermittent asthma without complication   Upper respiratory infection, acute   Discharge Condition: Stable  Disposition:   Follow-up Information     Vevelyn Francois, NP. Schedule an appointment as soon as possible for a visit in 1 week(s).   Specialty: Adult Health Nurse Practitioner Contact information: [address] [place] [address] [phone]                Pt is discharged home in good condition and is to follow up with Vevelyn Francois, NP this week to have labs evaluated. Molly Gutierrez is instructed to increase activity slowly and balance with rest for the next few days, and use prescribed medication to complete treatment of pain  Diet: Regular Wt Readings from Last 3 Encounters:  04/25/21 54 kg  04/04/21 57.6 kg  04/04/21 53 kg    History of present illness:  40 year old female with past medical history of sickle cell anemia, history of pulmonary embolism on Xarelto, asthma who presents to [facility] emergency department with complaints of chest and abdominal pain.   Patient explains that 1 week ago she developed what she describes as a "cold" with nasal congestion and cough productive with green sputum.  Patient symptoms persisted over the course of the next week for her cough and congestion did subside somewhat by Saturday and Sunday she began to experience chest and abdominal pain.  Patient explains that this chest and abdominal pain are consistent with her usual sickle cell crises.   The days that followed patient's chest and abdominal pain became progressively more severe.  Patient describes her pain as "throbbing",  nonradiating and worse with any movement whatsoever.  Patient complains of associated generalized weakness and poor appetite.  Patient denies fevers, nausea, vomiting, diarrhea.  Patient denies recent travel sick contacts or contact with confirmed COVID-19 infection.   Patient symptoms continued to worsen until she eventually presented to [facility] emergency department for evaluation.   Upon evaluation in the emergency department patient was felt to be exhibiting symptoms consistent with acute sickle cell crisis.  COVID-19 PCR and influenza testing were negative.  Patient initiated on intravenous fluids as well as several doses of intravenous Dilaudid.  Due to continued intractable pain the hospitalist group was then called to assess the patient for admission to the hospital.  Hospital Course:  Patient was admitted for sickle cell pain crisis and managed appropriately with IVF, IV Dilaudid via PCA and IV Toradol, as well as other adjunct therapies per sickle cell pain management protocols.  Patient's hemoglobin dropped to a nadir of 6.2 which is below her baseline for which she was transfused with 1 unit of packed red blood cell, and hemoglobin improved to baseline of 7.7.  Patient also improved in all her symptoms including pain being returned to baseline. Patient was gradually transitioned to her oral home pain medications which was well-tolerated. As at today, patient was ambulating well with no significant pain, eating and drinking freely. And her vitals were all within normal limits. Patient was therefore discharged home today in a hemodynamically stable condition. Her pain medications were refilled.  Molly Gutierrez will follow-up with PCP within 1 week of this discharge.  Molly Gutierrez was counseled extensively about nonpharmacologic means of pain management, patient verbalized understanding and was appreciative of  the care received during this admission.   We discussed the need for good  hydration, monitoring of hydration status, avoidance of heat, cold, stress, and infection triggers. We discussed the need to be adherent with taking Hydrea and other home medications. Patient was reminded of the need to seek medical attention immediately if any symptom of bleeding, anemia, or infection occurs.  Discharge Exam: Vitals:   04/28/21 1107 04/28/21 1132  BP: 99/75   Pulse: 73   Resp: 14 14  Temp: 98.8 F (37.1 C)   SpO2: 93% 96%   Vitals:   04/28/21 0541 04/28/21 0831 04/28/21 1107 04/28/21 1132  BP: 102/76  99/75   Pulse: 82  73   Resp: 14 12 14 14   Temp: 98.5 F (36.9 C)  98.8 F (37.1 C)   TempSrc: Oral  Oral   SpO2: 95% 96% 93% 96%  Weight:      Height:       General appearance : Awake, alert, not in any distress. Speech Clear. Not toxic looking HEENT: Atraumatic and Normocephalic, pupils equally reactive to light and accomodation Neck: Supple, no JVD. No cervical lymphadenopathy.  Chest: Good air entry bilaterally, no added sounds  CVS: S1 S2 regular, no murmurs.  Abdomen: Bowel sounds present, Non tender and not distended with no gaurding, rigidity or rebound. Extremities: B/L Lower Ext shows no edema, both legs are warm to touch Neurology: Awake alert, and oriented X 3, CN II-XII intact, Non focal Skin: No Rash  Discharge Instructions  Discharge Instructions     Diet - low sodium heart healthy   Complete by: As directed    Increase activity slowly   Complete by: As directed       Allergies as of 04/28/2021       Reactions   Cefaclor Hives, Swelling   Hydroxyurea Palpitations, Other (See Comments)   Lowers "blood levels" and heart rate (causes HYPOtension); "it messes me up, it drops my levels and stuff"   Omeprazole Anaphylaxis, Other (See Comments)   Causes "sharp pains in the stomach"   Tomato Other (See Comments)   Break out    Ketamine Palpitations, Other (See Comments)   "Pt states she has had previous reaction to ketamine. States she  becomes flushed, heart races, dizzy, and feels like she is going to pass out."        Medication List     TAKE these medications    albuterol 108 (90 Base) MCG/ACT inhaler Commonly known as: VENTOLIN HFA Inhale 2 puffs into the lungs 2 (two) times daily as needed for wheezing or shortness of breath.   celecoxib 200 MG capsule Commonly known as: CELEBREX Take 200 mg by mouth daily as needed for moderate pain or mild pain.   Deferasirox 360 MG Tabs Take 3 tablets by mouth at bedtime.   diphenhydrAMINE 25 mg capsule Commonly known as: BENADRYL Take 25 mg by mouth 3 (three) times daily as needed for itching.   fluticasone 50 MCG/ACT nasal spray Commonly known as: FLONASE Place 1 spray into both nostrils daily as needed for allergies or rhinitis.   HYDROmorphone 4 MG tablet Commonly known as: DILAUDID Take 1 tablet (4 mg total) by mouth every 6 (six) hours as needed for up to 15 days for moderate pain or severe pain.   mirtazapine 45 MG tablet Commonly known as: REMERON Take 1 tablet (45 mg  total) by mouth at bedtime.   mometasone-formoterol 100-5 MCG/ACT Aero Commonly known as: DULERA Inhale 2 puffs into the lungs daily as needed for wheezing or shortness of breath.   morphine 15 MG 12 hr tablet Commonly known as: MS CONTIN Take 1 tablet (15 mg total) by mouth every 12 (twelve) hours. Must last 30 days.   omeprazole 20 MG capsule Commonly known as: PRILOSEC Take 1 capsule (20 mg total) by mouth 2 (two) times daily before a meal.   Oxbryta 500 MG Tabs tablet Generic drug: voxelotor Take 1,500 mg by mouth at bedtime.   polyethylene glycol 17 g packet Commonly known as: MIRALAX / GLYCOLAX Take 17 g by mouth daily as needed for mild constipation.   promethazine 25 MG tablet Commonly known as: PHENERGAN Take 0.5-1 tablets (12.5-25 mg total) by mouth every 6 (six) hours as needed for nausea or vomiting.   Xarelto 20 MG Tabs tablet Generic drug: rivaroxaban Take 1  tablet (20 mg total) by mouth daily with supper.        The results of significant diagnostics from this hospitalization (including imaging, microbiology, ancillary and laboratory) are listed below for reference.    Significant Diagnostic Studies: DG Chest 2 View  Result Date: 04/25/2021 CLINICAL DATA:  Chest pain, abdominal pain. EXAM: CHEST - 2 VIEW COMPARISON:  Chest x-ray 03/17/2021, CT angiography chest 03/17/2021 FINDINGS: Right chest wall Port-A-Cath with tip overlying the expected region of the superior cavoatrial junction. Left chest wall central venous catheter with tip overlying the right atrium. The heart and mediastinal contours are unchanged. No focal consolidation. No pulmonary edema. No pleural effusion. No pneumothorax. No acute osseous abnormality. IMPRESSION: No active cardiopulmonary disease. Electronically Signed   By: Iven Finn M.D.   On: 04/25/2021 00:02    Microbiology: Recent Results (from the past 240 hour(s))  Resp Panel by RT-PCR (Flu A&B, Covid) Nasopharyngeal Swab     Status: None   Collection Time: 04/25/21  1:28 AM   Specimen: Nasopharyngeal Swab; Nasopharyngeal(NP) swabs in vial transport medium  Result Value Ref Range Status   SARS Coronavirus 2 by RT PCR NEGATIVE NEGATIVE Final    Comment: (NOTE) SARS-CoV-2 target nucleic acids are NOT DETECTED.  The SARS-CoV-2 RNA is generally detectable in upper respiratory specimens during the acute phase of infection. The lowest concentration of SARS-CoV-2 viral copies this assay can detect is 138 copies/mL. A negative result does not preclude SARS-Cov-2 infection and should not be used as the sole basis for treatment or other patient management decisions. A negative result may occur with  improper specimen collection/handling, submission of specimen other than nasopharyngeal swab, presence of viral mutation(s) within the areas targeted by this assay, and inadequate number of viral copies(<138 copies/mL). A  negative result must be combined with clinical observations, patient history, and epidemiological information. The expected result is Negative.  Fact Sheet for Patients:  EntrepreneurPulse.com.au  Fact Sheet for Healthcare Providers:  IncredibleEmployment.be  This test is no t yet approved or cleared by the [place] FDA and  has been authorized for detection and/or diagnosis of SARS-CoV-2 by FDA under an Emergency Use Authorization (EUA). This EUA will remain  in effect (meaning this test can be used) for the duration of the COVID-19 declaration under Section 564(b)(1) of the Act, 21 U.S.C.section 360bbb-3(b)(1), unless the authorization is terminated  or revoked sooner.       Influenza A by PCR NEGATIVE NEGATIVE Final   Influenza B by PCR NEGATIVE NEGATIVE Final  Comment: (NOTE) The Xpert Xpress SARS-CoV-2/FLU/RSV plus assay is intended as an aid in the diagnosis of influenza from Nasopharyngeal swab specimens and should not be used as a sole basis for treatment. Nasal washings and aspirates are unacceptable for Xpert Xpress SARS-CoV-2/FLU/RSV testing.  Fact Sheet for Patients: EntrepreneurPulse.com.au  Fact Sheet for Healthcare Providers: IncredibleEmployment.be  This test is not yet approved or cleared by the [place] FDA and has been authorized for detection and/or diagnosis of SARS-CoV-2 by FDA under an Emergency Use Authorization (EUA). This EUA will remain in effect (meaning this test can be used) for the duration of the COVID-19 declaration under Section 564(b)(1) of the Act, 21 U.S.C. section 360bbb-3(b)(1), unless the authorization is terminated or revoked.  Performed at [facility], [place] [address], [place], [place] [address]   Group A Strep by PCR     Status: None   Collection Time: 04/25/21  6:28 AM   Specimen: Throat; Sterile Swab  Result Value Ref Range  Status   Group A Strep by PCR NOT DETECTED NOT DETECTED Final    Comment: Performed at [facility], [place] [address] [place], [place] [address]  Respiratory (~20 pathogens) panel by PCR     Status: None   Collection Time: 04/25/21  6:29 AM   Specimen: Throat; Respiratory  Result Value Ref Range Status   Adenovirus NOT DETECTED NOT DETECTED Final   Coronavirus 229E NOT DETECTED NOT DETECTED Final    Comment: (NOTE) The Coronavirus on the Respiratory Panel, DOES NOT test for the novel  Coronavirus (2019 nCoV)    Coronavirus HKU1 NOT DETECTED NOT DETECTED Final   Coronavirus NL63 NOT DETECTED NOT DETECTED Final   Coronavirus OC43 NOT DETECTED NOT DETECTED Final   Metapneumovirus NOT DETECTED NOT DETECTED Final   Rhinovirus / Enterovirus NOT DETECTED NOT DETECTED Final   Influenza A NOT DETECTED NOT DETECTED Final   Influenza B NOT DETECTED NOT DETECTED Final   Parainfluenza Virus 1 NOT DETECTED NOT DETECTED Final   Parainfluenza Virus 2 NOT DETECTED NOT DETECTED Final   Parainfluenza Virus 3 NOT DETECTED NOT DETECTED Final   Parainfluenza Virus 4 NOT DETECTED NOT DETECTED Final   Respiratory Syncytial Virus NOT DETECTED NOT DETECTED Final   Bordetella pertussis NOT DETECTED NOT DETECTED Final   Bordetella Parapertussis NOT DETECTED NOT DETECTED Final   Chlamydophila pneumoniae NOT DETECTED NOT DETECTED Final   Mycoplasma pneumoniae NOT DETECTED NOT DETECTED Final    Comment: Performed at [facility], [place]. [address] [place], [place] [address]     Labs: Basic Metabolic Panel: Recent Labs  Lab 04/25/21 0128 04/27/21 0833  NA 134* 137  K 3.6 3.8  CL 105 109  CO2 23 24  GLUCOSE 99 119*  BUN 11 9  CREATININE 0.53 0.57  CALCIUM 8.9 8.2*   Liver Function Tests: Recent Labs  Lab 04/25/21 0128  AST 36  ALT 17  ALKPHOS 54  BILITOT 3.1*  PROT 8.4*  ALBUMIN 4.7   Recent Labs  Lab 04/25/21 0128  LIPASE 38   No results for input(s): AMMONIA in  the last 168 hours. CBC: Recent Labs  Lab 04/25/21 0128 04/27/21 0833 04/27/21 2036  WBC 17.1* 18.1* 15.1*  NEUTROABS 11.4*  --   --   HGB 7.8* 6.2* 7.7*  HCT 21.9* 16.8* 22.1*  MCV 89.4 87.5 89.1  PLT 557* 532* 558*   Cardiac Enzymes: No results for input(s): CKTOTAL, CKMB, CKMBINDEX, TROPONINI in the last  168 hours. BNP: Invalid input(s): POCBNP CBG: No results for input(s): GLUCAP in the last 168 hours.  Time coordinating discharge: 50 minutes  Signed:  [facility] 04/28/2021, 12:15 PM

## 2021-05-08 ENCOUNTER — Telehealth: Payer: Self-pay

## 2021-05-08 NOTE — Telephone Encounter (Signed)
Hydromorphone  

## 2021-05-09 ENCOUNTER — Other Ambulatory Visit: Payer: Self-pay | Admitting: Nurse Practitioner

## 2021-05-09 DIAGNOSIS — D571 Sickle-cell disease without crisis: Secondary | ICD-10-CM

## 2021-05-09 DIAGNOSIS — G894 Chronic pain syndrome: Secondary | ICD-10-CM

## 2021-05-09 MED ORDER — HYDROMORPHONE HCL 4 MG PO TABS: 4.0000 mg | ORAL_TABLET | Freq: Four times a day (QID) | ORAL | 0 refills | Status: DC | PRN

## 2021-05-16 ENCOUNTER — Ambulatory Visit: Payer: Medicare Other | Admitting: Nurse Practitioner

## 2021-05-18 ENCOUNTER — Ambulatory Visit: Payer: Medicare Other | Admitting: Nurse Practitioner

## 2021-05-29 ENCOUNTER — Telehealth: Payer: Self-pay

## 2021-05-29 NOTE — Telephone Encounter (Signed)
Hydromorphone Morphine  [facility] in [place]

## 2021-05-30 ENCOUNTER — Other Ambulatory Visit: Payer: Self-pay | Admitting: Nurse Practitioner

## 2021-05-30 DIAGNOSIS — D571 Sickle-cell disease without crisis: Secondary | ICD-10-CM

## 2021-05-30 DIAGNOSIS — G894 Chronic pain syndrome: Secondary | ICD-10-CM

## 2021-05-30 MED ORDER — HYDROMORPHONE HCL 4 MG PO TABS: 4.0000 mg | ORAL_TABLET | Freq: Four times a day (QID) | ORAL | 0 refills | Status: DC | PRN

## 2021-05-30 MED ORDER — MORPHINE SULFATE ER 15 MG PO TBCR: 15.0000 mg | EXTENDED_RELEASE_TABLET | Freq: Two times a day (BID) | ORAL | 0 refills | Status: DC

## 2021-06-02 ENCOUNTER — Inpatient Hospital Stay (HOSPITAL_COMMUNITY)
Admission: EM | Admit: 2021-06-02 | Discharge: 2021-06-07 | DRG: 812 | Disposition: A | Discharge status: Home / Self Care | Payer: Medicare Other | Attending: Internal Medicine | Admitting: Internal Medicine

## 2021-06-02 ENCOUNTER — Emergency Department (HOSPITAL_COMMUNITY): Payer: Medicare Other

## 2021-06-02 ENCOUNTER — Encounter (HOSPITAL_COMMUNITY): Payer: Self-pay

## 2021-06-02 DIAGNOSIS — Z8249 Family history of ischemic heart disease and other diseases of the circulatory system: Secondary | ICD-10-CM

## 2021-06-02 DIAGNOSIS — Z833 Family history of diabetes mellitus: Secondary | ICD-10-CM

## 2021-06-02 DIAGNOSIS — E860 Dehydration: Secondary | ICD-10-CM | POA: Diagnosis not present

## 2021-06-02 DIAGNOSIS — Z83438 Family history of other disorder of lipoprotein metabolism and other lipidemia: Secondary | ICD-10-CM

## 2021-06-02 DIAGNOSIS — D649 Anemia, unspecified: Secondary | ICD-10-CM | POA: Diagnosis present

## 2021-06-02 DIAGNOSIS — D638 Anemia in other chronic diseases classified elsewhere: Secondary | ICD-10-CM | POA: Diagnosis not present

## 2021-06-02 DIAGNOSIS — D571 Sickle-cell disease without crisis: Secondary | ICD-10-CM

## 2021-06-02 DIAGNOSIS — Z20822 Contact with and (suspected) exposure to covid-19: Secondary | ICD-10-CM | POA: Diagnosis not present

## 2021-06-02 DIAGNOSIS — Z888 Allergy status to other drugs, medicaments and biological substances status: Secondary | ICD-10-CM

## 2021-06-02 DIAGNOSIS — Z86711 Personal history of pulmonary embolism: Secondary | ICD-10-CM | POA: Diagnosis not present

## 2021-06-02 DIAGNOSIS — J45909 Unspecified asthma, uncomplicated: Secondary | ICD-10-CM | POA: Diagnosis present

## 2021-06-02 DIAGNOSIS — Z841 Family history of disorders of kidney and ureter: Secondary | ICD-10-CM | POA: Diagnosis not present

## 2021-06-02 DIAGNOSIS — R0789 Other chest pain: Secondary | ICD-10-CM | POA: Diagnosis present

## 2021-06-02 DIAGNOSIS — F1120 Opioid dependence, uncomplicated: Secondary | ICD-10-CM | POA: Diagnosis present

## 2021-06-02 DIAGNOSIS — Z7901 Long term (current) use of anticoagulants: Secondary | ICD-10-CM | POA: Diagnosis not present

## 2021-06-02 DIAGNOSIS — G894 Chronic pain syndrome: Secondary | ICD-10-CM | POA: Diagnosis present

## 2021-06-02 DIAGNOSIS — J4520 Mild intermittent asthma, uncomplicated: Secondary | ICD-10-CM | POA: Diagnosis not present

## 2021-06-02 DIAGNOSIS — Z79899 Other long term (current) drug therapy: Secondary | ICD-10-CM | POA: Diagnosis not present

## 2021-06-02 DIAGNOSIS — D5700 Hb-SS disease with crisis, unspecified: Principal | ICD-10-CM | POA: Diagnosis present

## 2021-06-02 DIAGNOSIS — D72829 Elevated white blood cell count, unspecified: Secondary | ICD-10-CM | POA: Diagnosis present

## 2021-06-02 DIAGNOSIS — I2699 Other pulmonary embolism without acute cor pulmonale: Secondary | ICD-10-CM | POA: Diagnosis present

## 2021-06-02 LAB — COMPREHENSIVE METABOLIC PANEL
ALT: 17 U/L (ref 0–44)
AST: 37 U/L (ref 15–41)
Albumin: 4.4 g/dL (ref 3.5–5.0)
Alkaline Phosphatase: 51 U/L (ref 38–126)
Anion gap: 7 (ref 5–15)
BUN: 7 mg/dL (ref 6–20)
CO2: 26 mmol/L (ref 22–32)
Calcium: 9 mg/dL (ref 8.9–10.3)
Chloride: 105 mmol/L (ref 98–111)
Creatinine, Ser: 0.54 mg/dL (ref 0.44–1.00)
GFR, Estimated: >60 mL/min (ref >60)
Glucose, Bld: 95 mg/dL (ref 70–99)
Potassium: 3.6 mmol/L (ref 3.5–5.1)
Sodium: 138 mmol/L (ref 135–145)
Total Bilirubin: 4.9 mg/dL — ABNORMAL HIGH (ref 0.3–1.2)
Total Protein: 7.9 g/dL (ref 6.5–8.1)

## 2021-06-02 LAB — CBC WITH DIFFERENTIAL/PLATELET
Abs Immature Granulocytes: 0.19 10*3/uL — ABNORMAL HIGH (ref 0.00–0.07)
Basophils Absolute: 0.1 10*3/uL (ref 0.0–0.1)
Basophils Relative: 1 %
Eosinophils Absolute: 0.1 10*3/uL (ref 0.0–0.5)
Eosinophils Relative: 1 %
HCT: 21.5 % — ABNORMAL LOW (ref 36.0–46.0)
Hemoglobin: 7.5 g/dL — ABNORMAL LOW (ref 12.0–15.0)
Immature Granulocytes: 1 %
Lymphocytes Relative: 21 %
Lymphs Abs: 2.9 10*3/uL (ref 0.7–4.0)
MCH: 32.2 pg (ref 26.0–34.0)
MCHC: 34.9 g/dL (ref 30.0–36.0)
MCV: 92.3 fL (ref 80.0–100.0)
Monocytes Absolute: 1.8 10*3/uL — ABNORMAL HIGH (ref 0.1–1.0)
Monocytes Relative: 13 %
Neutro Abs: 8.8 10*3/uL — ABNORMAL HIGH (ref 1.7–7.7)
Neutrophils Relative %: 63 %
Platelets: 557 10*3/uL — ABNORMAL HIGH (ref 150–400)
RBC: 2.33 MIL/uL — ABNORMAL LOW (ref 3.87–5.11)
RDW: 20.6 % — ABNORMAL HIGH (ref 11.5–15.5)
WBC: 13.9 10*3/uL — ABNORMAL HIGH (ref 4.0–10.5)
nRBC: 0.8 % — ABNORMAL HIGH (ref 0.0–0.2)

## 2021-06-02 LAB — I-STAT BETA HCG BLOOD, ED (MC, WL, AP ONLY): I-stat hCG, quantitative: <5 m[IU]/mL (ref <5)

## 2021-06-02 LAB — RESP PANEL BY RT-PCR (FLU A&B, COVID) ARPGX2
Influenza A by PCR: NEGATIVE
Influenza B by PCR: NEGATIVE
SARS Coronavirus 2 by RT PCR: NEGATIVE

## 2021-06-02 MED ORDER — HYDROMORPHONE HCL 2 MG/ML IJ SOLN
INTRAMUSCULAR | Status: AC
  Administered 2021-06-02: 2 mg via INTRAVENOUS
  Filled 2021-06-02: qty 1

## 2021-06-02 MED ORDER — ONDANSETRON HCL 4 MG/2ML IJ SOLN
4.0000 mg | INTRAMUSCULAR | Status: DC | PRN
  Administered 2021-06-02: 4 mg via INTRAVENOUS
  Filled 2021-06-02: qty 2

## 2021-06-02 MED ORDER — SODIUM CHLORIDE 0.45 % IV SOLN: INTRAVENOUS | Status: DC

## 2021-06-02 MED ORDER — HYDROMORPHONE HCL 2 MG/ML IJ SOLN
2.0000 mg | INTRAMUSCULAR | Status: AC
  Administered 2021-06-02: 2 mg via INTRAVENOUS
  Filled 2021-06-02: qty 1

## 2021-06-02 MED ORDER — DIPHENHYDRAMINE HCL 25 MG PO CAPS
25.0000 mg | ORAL_CAPSULE | ORAL | Status: DC | PRN
  Administered 2021-06-02: 25 mg via ORAL
  Filled 2021-06-02: qty 1

## 2021-06-02 NOTE — ED Provider Notes (Signed)
Emergency Medicine Provider Triage Evaluation Note  [facility] , a 40 y.o. female  was evaluated in triage.  Pt complains of chest pain that started 4 days ago. Pain constant. No sob, cough. Hx pe, compliant with meds.  Review of Systems  Positive: Chest pain Negative: Sob, cough  Physical Exam  BP (!) 128/96 (BP Location: Right Arm)    Pulse 94    Temp 98.5 F (36.9 C) (Oral)    Resp 18    Ht 5\' 3"  (1.6 m)    Wt 57.6 kg    SpO2 90%    BMI 22.50 kg/m  Gen:   Awake, no distress   Resp:  Normal effort  MSK:   Moves extremities without difficulty  Other:  Heart rrr, decreased breath sounds throughout  Medical Decision Making  Medically screening exam initiated at 9:38 PM.  Appropriate orders placed.  [facility] was informed that the remainder of the evaluation will be completed by another provider, this initial triage assessment does not replace that evaluation, and the importance of remaining in the ED until their evaluation is complete.     Bishop Dublin 06/02/21 2139    Carmin Muskrat, MD 06/02/21 2227

## 2021-06-02 NOTE — ED Provider Notes (Signed)
[place] DEPT Provider Note   CSN: [id number] Arrival date & time: 06/02/21  2126     History Chief Complaint  Patient presents with   Sickle Cell Pain Crisis    Molly Gutierrez is a 40 y.o. female.  The history is provided by the patient and medical records.  Sickle Cell Pain Crisis Associated symptoms: chest pain    40 year old female with history of sickle cell anemia, PE on Xarelto, presenting to the ED with sickle cell pain crisis.  States she started having some chest pain Thursday evening, worsening since that time.  She denies any cough, shortness of breath, or fever.  She has been compliant with her Xarelto and has not missed any doses.  She is continued with her home medications but pain has not been controlled.  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   Sickle cell anemia with pain ([place]) 04/24/2020   Mild intermittent asthma without complication 48/54/6270   Sickle cell anemia with crisis ([place]) 01/07/2020   Sickle cell crisis acute chest syndrome ([place]) 07/29/2019   Drug-seeking behavior 07/07/2019   Sinus tachycardia 07/07/2019   Therapeutic opioid-induced constipation (OIC) 07/07/2019   Breast mass in female 06/29/2019   Atelectasis of right lung 06/29/2019   Sickle cell crisis ([place]) 06/27/2019   Sickle cell pain crisis ([place]) 04/19/2019   Pneumonia 03/27/2019   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   Anterior chest wall pain 11/13/2018   Epigastric abdominal pain 11/13/2018   Hematuria 11/13/2018   Hyperbilirubinemia 11/12/2018   Long term current use of anticoagulant therapy 11/12/2018   History of pulmonary  embolism 09/26/2018   Hx of pulmonary embolism ([place]) 09/26/2018   Opioid dependence ([place]) 09/13/2018   Port-A-Cath in place 09/13/2018   Narcotic abuse, continuous ([place]) 08/28/2018   Anemia 07/03/2018   Personal history of other venous thrombosis and embolism 07/03/2018   History of transfusion 07/03/2018   Gastro-esophageal reflux disease without esophagitis 06/02/2018   Asthma 05/19/2018   Anxiety and depression 10/30/2017   At risk for sepsis 06/13/2017   Mitral regurgitation 02/01/2014   Itching 12/13/2013   Nausea & vomiting 12/13/2013   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   Frequent complaints of pain 11/01/2013   Hypokalemia 09/19/2013   S/P total hip arthroplasty 05/19/2013   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, pelvic region and thigh 05/12/2013   Lower urinary tract infectious disease 03/02/2013   Chest pain 11/14/2012   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     Family History  Problem Relation Age of Onset   Renal Disease Mother    Hypertension Mother    High Cholesterol Mother    Heart attack Mother    Diabetes Brother     Social History   Tobacco Use   Smoking status: Never   Smokeless tobacco: Never  Vaping Use   Vaping Use: Never used  Substance Use Topics   Alcohol use: Never   Drug use: Never    Home Medications Prior to Admission medications   Medication Sig  Start Date End Date Taking? Authorizing Provider  albuterol (VENTOLIN HFA) 108 (90 Base) MCG/ACT inhaler Inhale 2 puffs into the lungs 2 (two) times daily as needed for wheezing or shortness of breath. 04/13/20 04/25/21  Vevelyn Francois, NP  celecoxib (CELEBREX) 200 MG capsule Take 200 mg by mouth daily as needed for moderate pain or mild pain. 02/09/21   [provider]  Deferasirox 360 MG TABS Take 3 tablets by mouth at bedtime. 04/23/21   [provider]  diphenhydrAMINE (BENADRYL) 25 mg capsule Take 25 mg by mouth 3 (three) times daily as needed for itching.    [provider]  fluticasone (FLONASE) 50 MCG/ACT nasal spray Place 1 spray into both nostrils daily as needed for allergies or rhinitis.    [provider]  HYDROmorphone (DILAUDID) 4 MG tablet Take 1 tablet (4 mg total) by mouth every 6 (six) hours as needed for up to 15 days for moderate pain or severe pain. 05/30/21 06/14/21  Vevelyn Francois, NP  mirtazapine (REMERON) 45 MG tablet Take 1 tablet (45 mg total) by mouth at bedtime. 04/20/21 04/20/22  Vevelyn Francois, NP  mometasone-formoterol (DULERA) 100-5 MCG/ACT AERO Inhale 2 puffs into the lungs daily as needed for wheezing or shortness of breath. 04/13/20 04/25/21  Vevelyn Francois, NP  morphine (MS CONTIN) 15 MG 12 hr tablet Take 1 tablet (15 mg total) by mouth every 12 (twelve) hours. Must last 30 days. 06/01/21 07/01/21  Vevelyn Francois, NP  omeprazole (PRILOSEC) 20 MG capsule Take 1 capsule (20 mg total) by mouth 2 (two) times daily before a meal. 04/23/21 04/23/22  Vevelyn Francois, NP  polyethylene glycol (MIRALAX / GLYCOLAX) 17 g packet Take 17 g by mouth daily as needed for mild constipation.    [provider]  promethazine (PHENERGAN) 25 MG tablet Take 0.5-1 tablets (12.5-25 mg total) by mouth every 6 (six) hours as needed for nausea or vomiting. 10/25/20 10/25/21  Vevelyn Francois, NP  voxelotor (OXBRYTA) 500 MG TABS tablet Take 1,500 mg by  mouth at bedtime.    [provider]  XARELTO 20 MG TABS tablet Take 1 tablet (20 mg total) by mouth daily with supper. 04/20/21 04/20/22  Vevelyn Francois, NP    Allergies    Cefaclor, Hydroxyurea, Omeprazole, Tomato, and Ketamine  Review of Systems   Review of Systems  Cardiovascular:  Positive for chest pain.  All other systems reviewed and are negative.  Physical Exam Updated Vital Signs BP (!) 128/96 (BP Location: Right Arm)    Pulse 94    Temp 98.5 F (36.9 C) (Oral)    Resp 18    Ht 5\' 3"  (1.6 m)    Wt 57.6 kg    LMP 05/22/2021 (Exact Date)    SpO2 90%    BMI 22.50 kg/m   Physical Exam Vitals and nursing note reviewed.  Constitutional:      Appearance: She is well-developed.  HENT:  Head: Normocephalic and atraumatic.  Eyes:     Conjunctiva/sclera: Conjunctivae normal.     Pupils: Pupils are equal, round, and reactive to light.  Cardiovascular:     Rate and Rhythm: Normal rate and regular rhythm.     Heart sounds: Normal heart sounds.  Pulmonary:     Effort: Pulmonary effort is normal. No respiratory distress.     Breath sounds: Normal breath sounds. No rhonchi.  Chest:     Comments: Port right chest wall, no signs of infection Abdominal:     General: Bowel sounds are normal.     Palpations: Abdomen is soft.  Musculoskeletal:        General: Normal range of motion.     Cervical back: Normal range of motion.  Skin:    General: Skin is warm and dry.  Neurological:     Mental Status: She is alert and oriented to person, place, and time.    ED Results / Procedures / Treatments   Labs (all labs ordered are listed, but only abnormal results are displayed) Labs Reviewed  CBC WITH DIFFERENTIAL/PLATELET - Abnormal; Notable for the following components:      Result Value   WBC 13.9 (*)    RBC 2.33 (*)    Hemoglobin 7.5 (*)    HCT 21.5 (*)    RDW 20.6 (*)    Platelets 557 (*)    nRBC 0.8 (*)    Neutro Abs 8.8 (*)    Monocytes Absolute 1.8 (*)    Abs  Immature Granulocytes 0.19 (*)    All other components within normal limits  COMPREHENSIVE METABOLIC PANEL - Abnormal; Notable for the following components:   Total Bilirubin 4.9 (*)    All other components within normal limits  RETICULOCYTES - Abnormal; Notable for the following components:   Retic Ct Pct 23.0 (*)    RBC. 2.32 (*)    Retic Count, Absolute 529.0 (*)    Immature Retic Fract 22.1 (*)    All other components within normal limits  MAGNESIUM - Abnormal; Notable for the following components:   Magnesium 3.2 (*)    All other components within normal limits  RESP PANEL BY RT-PCR (FLU A&B, COVID) ARPGX2  MAGNESIUM  COMPREHENSIVE METABOLIC PANEL  CBC WITH DIFFERENTIAL/PLATELET  RETICULOCYTES  URINALYSIS, COMPLETE (UACMP) WITH MICROSCOPIC  RAPID URINE DRUG SCREEN, HOSP PERFORMED  I-STAT BETA HCG BLOOD, ED ([facility], [facility], [facility] ONLY)  TYPE AND SCREEN    EKG None  Radiology DG Chest 2 View  Result Date: 06/02/2021 CLINICAL DATA:  Chest pain, asthma, sickle cell disease EXAM: CHEST - 2 VIEW COMPARISON:  04/25/2021 bilateral internal jugular chest FINDINGS: Lungs are well expanded, symmetric, and clear. No pneumothorax or pleural effusion. Cardiac size within normal limits. Port are unchanged with the tips within the superior cavoatrial junction and right atrium. Pulmonary vascularity is normal. Osseous structures are age-appropriate. No acute bone abnormality. IMPRESSION: No active cardiopulmonary disease. Electronically Signed   By: Fidela Salisbury M.D.   On: 06/02/2021 22:13    Procedures Procedures   CRITICAL CARE Performed by: Larene Pickett   Total critical care time: 35 minutes  Critical care time was exclusive of separately billable procedures and treating other patients.  Critical care was necessary to treat or prevent imminent or life-threatening deterioration.  Critical care was time spent personally by me on the following activities: development of treatment plan  with patient and/or surrogate as well as nursing, discussions with consultants, evaluation of patient's  response to treatment, examination of patient, obtaining history from patient or surrogate, ordering and performing treatments and interventions, ordering and review of laboratory studies, ordering and review of radiographic studies, pulse oximetry and re-evaluation of patient's condition.   Medications Ordered in ED Medications - No data to display  ED Course  I have reviewed the triage vital signs and the nursing notes.  Pertinent labs & imaging results that were available during my care of the patient were reviewed by me and considered in my medical decision making (see chart for details).    MDM Rules/Calculators/A&P                          40 year old female presenting to the ED with sickle cell pain crisis.  Worsening over the past 3 days.  She is also having chest pain but denies shortness of breath, fever, or cough.  Has history of PE, has been compliant with her anticoagulation.  She is afebrile and nontoxic.  Labs are overall reassuring, does have increased reticulocyte count.  Chest x-ray is clear.  No findings suggestive of acute chest syndrome.  We will attempt pain control--given IV Dilaudid, supplemental O2, gentle hydration.  12:32 AM After first few doses of pain medication, pain is only minimally improved.  She does not feel she can adequately control pain at home.  Sickle cell clinic is closed for weekend so admission is reasonable.  Discussed with hospitalist, Dr. Velia Meyer-- will admit for ongoing care.  Final Clinical Impression(s) / ED Diagnoses Final diagnoses:  Sickle cell pain crisis [facility])    Rx / DC Orders ED Discharge Orders     None        Larene Pickett, PA-C 06/03/21 0247    Sherwood Gambler, MD 06/05/21 [phone]

## 2021-06-02 NOTE — ED Notes (Signed)
Attempted lab draw, pt. Has port. Pt. Requested lab draw from her port. RN, Solmon Ice made aware.

## 2021-06-02 NOTE — ED Triage Notes (Signed)
Patient arrives from home with complaint of sickle cell pain crisis that began Thursday. Patient reporting centralized chest pain 10/10, and left leg pain.

## 2021-06-02 NOTE — ED Notes (Signed)
Upon opening dilaudid packaging, medication spilled out of syringe.  Witnessed by Maylon Cos, Therapist, sports.

## 2021-06-03 ENCOUNTER — Encounter (HOSPITAL_COMMUNITY): Payer: Self-pay | Admitting: Internal Medicine

## 2021-06-03 ENCOUNTER — Other Ambulatory Visit: Payer: Self-pay

## 2021-06-03 DIAGNOSIS — Z841 Family history of disorders of kidney and ureter: Secondary | ICD-10-CM | POA: Diagnosis not present

## 2021-06-03 DIAGNOSIS — D638 Anemia in other chronic diseases classified elsewhere: Secondary | ICD-10-CM | POA: Diagnosis not present

## 2021-06-03 DIAGNOSIS — Z8249 Family history of ischemic heart disease and other diseases of the circulatory system: Secondary | ICD-10-CM | POA: Diagnosis not present

## 2021-06-03 DIAGNOSIS — R0789 Other chest pain: Secondary | ICD-10-CM | POA: Diagnosis present

## 2021-06-03 DIAGNOSIS — D5700 Hb-SS disease with crisis, unspecified: Secondary | ICD-10-CM | POA: Diagnosis present

## 2021-06-03 DIAGNOSIS — Z83438 Family history of other disorder of lipoprotein metabolism and other lipidemia: Secondary | ICD-10-CM | POA: Diagnosis not present

## 2021-06-03 DIAGNOSIS — J4520 Mild intermittent asthma, uncomplicated: Secondary | ICD-10-CM | POA: Diagnosis not present

## 2021-06-03 DIAGNOSIS — Z86711 Personal history of pulmonary embolism: Secondary | ICD-10-CM | POA: Diagnosis not present

## 2021-06-03 DIAGNOSIS — G894 Chronic pain syndrome: Secondary | ICD-10-CM | POA: Diagnosis not present

## 2021-06-03 DIAGNOSIS — F1120 Opioid dependence, uncomplicated: Secondary | ICD-10-CM | POA: Diagnosis not present

## 2021-06-03 DIAGNOSIS — Z7901 Long term (current) use of anticoagulants: Secondary | ICD-10-CM | POA: Diagnosis not present

## 2021-06-03 DIAGNOSIS — D72829 Elevated white blood cell count, unspecified: Secondary | ICD-10-CM | POA: Diagnosis not present

## 2021-06-03 DIAGNOSIS — E860 Dehydration: Secondary | ICD-10-CM | POA: Diagnosis not present

## 2021-06-03 DIAGNOSIS — Z20822 Contact with and (suspected) exposure to covid-19: Secondary | ICD-10-CM | POA: Diagnosis not present

## 2021-06-03 DIAGNOSIS — Z833 Family history of diabetes mellitus: Secondary | ICD-10-CM | POA: Diagnosis not present

## 2021-06-03 DIAGNOSIS — D649 Anemia, unspecified: Secondary | ICD-10-CM | POA: Diagnosis not present

## 2021-06-03 DIAGNOSIS — Z888 Allergy status to other drugs, medicaments and biological substances status: Secondary | ICD-10-CM | POA: Diagnosis not present

## 2021-06-03 DIAGNOSIS — Z79899 Other long term (current) drug therapy: Secondary | ICD-10-CM | POA: Diagnosis not present

## 2021-06-03 LAB — URINALYSIS, COMPLETE (UACMP) WITH MICROSCOPIC
Bacteria, UA: NONE SEEN
Bilirubin Urine: NEGATIVE
Glucose, UA: NEGATIVE mg/dL
Ketones, ur: NEGATIVE mg/dL
Leukocytes,Ua: NEGATIVE
Nitrite: NEGATIVE
Protein, ur: 30 mg/dL — AB
Specific Gravity, Urine: 1.011 (ref 1.005–1.030)
pH: 6 (ref 5.0–8.0)

## 2021-06-03 LAB — CBC WITH DIFFERENTIAL/PLATELET
Abs Immature Granulocytes: 0.14 10*3/uL — ABNORMAL HIGH (ref 0.00–0.07)
Basophils Absolute: 0.1 10*3/uL (ref 0.0–0.1)
Basophils Relative: 1 %
Eosinophils Absolute: 0.3 10*3/uL (ref 0.0–0.5)
Eosinophils Relative: 2 %
HCT: 21 % — ABNORMAL LOW (ref 36.0–46.0)
Hemoglobin: 7.3 g/dL — ABNORMAL LOW (ref 12.0–15.0)
Immature Granulocytes: 1 %
Lymphocytes Relative: 30 %
Lymphs Abs: 4.8 10*3/uL — ABNORMAL HIGH (ref 0.7–4.0)
MCH: 32.2 pg (ref 26.0–34.0)
MCHC: 34.8 g/dL (ref 30.0–36.0)
MCV: 92.5 fL (ref 80.0–100.0)
Monocytes Absolute: 2.2 10*3/uL — ABNORMAL HIGH (ref 0.1–1.0)
Monocytes Relative: 14 %
Neutro Abs: 8.3 10*3/uL — ABNORMAL HIGH (ref 1.7–7.7)
Neutrophils Relative %: 52 %
Platelets: 564 10*3/uL — ABNORMAL HIGH (ref 150–400)
RBC: 2.27 MIL/uL — ABNORMAL LOW (ref 3.87–5.11)
RDW: 20.6 % — ABNORMAL HIGH (ref 11.5–15.5)
WBC: 15.8 10*3/uL — ABNORMAL HIGH (ref 4.0–10.5)
nRBC: 0.8 % — ABNORMAL HIGH (ref 0.0–0.2)

## 2021-06-03 LAB — TYPE AND SCREEN
ABO/RH(D): A POS
Antibody Screen: NEGATIVE

## 2021-06-03 LAB — RETICULOCYTES
Immature Retic Fract: 22.1 % — ABNORMAL HIGH (ref 2.3–15.9)
Immature Retic Fract: 25.8 % — ABNORMAL HIGH (ref 2.3–15.9)
RBC.: 2.32 MIL/uL — ABNORMAL LOW (ref 3.87–5.11)
RBC.: 2.22 MIL/uL — ABNORMAL LOW (ref 3.87–5.11)
Retic Count, Absolute: 529 10*3/uL — ABNORMAL HIGH (ref 19.0–186.0)
Retic Count, Absolute: 496.5 10*3/uL — ABNORMAL HIGH (ref 19.0–186.0)
Retic Ct Pct: 23 % — ABNORMAL HIGH (ref 0.4–3.1)
Retic Ct Pct: 22.4 % — ABNORMAL HIGH (ref 0.4–3.1)

## 2021-06-03 LAB — COMPREHENSIVE METABOLIC PANEL
ALT: 16 U/L (ref 0–44)
AST: 38 U/L (ref 15–41)
Albumin: 4.2 g/dL (ref 3.5–5.0)
Alkaline Phosphatase: 45 U/L (ref 38–126)
Anion gap: 5 (ref 5–15)
BUN: 7 mg/dL (ref 6–20)
CO2: 25 mmol/L (ref 22–32)
Calcium: 8.4 mg/dL — ABNORMAL LOW (ref 8.9–10.3)
Chloride: 105 mmol/L (ref 98–111)
Creatinine, Ser: 0.58 mg/dL (ref 0.44–1.00)
GFR, Estimated: >60 mL/min (ref >60)
Glucose, Bld: 92 mg/dL (ref 70–99)
Potassium: 3.7 mmol/L (ref 3.5–5.1)
Sodium: 135 mmol/L (ref 135–145)
Total Bilirubin: 4.3 mg/dL — ABNORMAL HIGH (ref 0.3–1.2)
Total Protein: 7.3 g/dL (ref 6.5–8.1)

## 2021-06-03 LAB — MAGNESIUM
Magnesium: 3.2 mg/dL — ABNORMAL HIGH (ref 1.7–2.4)
Magnesium: 2.3 mg/dL (ref 1.7–2.4)

## 2021-06-03 LAB — TROPONIN I (HIGH SENSITIVITY)
Troponin I (High Sensitivity): 3 ng/L (ref <18)
Troponin I (High Sensitivity): 3 ng/L (ref <18)

## 2021-06-03 LAB — RAPID URINE DRUG SCREEN, HOSP PERFORMED
Amphetamines: NOT DETECTED
Barbiturates: NOT DETECTED
Benzodiazepines: NOT DETECTED
Cocaine: NOT DETECTED
Opiates: POSITIVE — AB
Tetrahydrocannabinol: NOT DETECTED

## 2021-06-03 MED ORDER — NALOXONE HCL 0.4 MG/ML IJ SOLN: 0.4000 mg | INTRAMUSCULAR | Status: DC | PRN

## 2021-06-03 MED ORDER — HYDROMORPHONE HCL 2 MG/ML IJ SOLN
2.0000 mg | INTRAMUSCULAR | Status: DC | PRN
  Administered 2021-06-03 (×5): 2 mg via INTRAVENOUS
  Filled 2021-06-03 (×5): qty 1

## 2021-06-03 MED ORDER — KETOROLAC TROMETHAMINE 15 MG/ML IJ SOLN
15.0000 mg | Freq: Four times a day (QID) | INTRAMUSCULAR | Status: DC | PRN
  Administered 2021-06-03 (×2): 15 mg via INTRAVENOUS
  Filled 2021-06-03 (×2): qty 1

## 2021-06-03 MED ORDER — CHLORHEXIDINE GLUCONATE CLOTH 2 % EX PADS
6.0000 | MEDICATED_PAD | Freq: Every day | CUTANEOUS | Status: DC
  Administered 2021-06-03 – 2021-06-07 (×5): 6 via TOPICAL

## 2021-06-03 MED ORDER — HYDROMORPHONE 1 MG/ML IV SOLN
INTRAVENOUS | Status: DC
  Administered 2021-06-03: 30 mg via INTRAVENOUS
  Administered 2021-06-04: 3 mg via INTRAVENOUS
  Administered 2021-06-04: 2.5 mg via INTRAVENOUS
  Administered 2021-06-04: 0 mg via INTRAVENOUS
  Administered 2021-06-04: 5.1 mg via INTRAVENOUS
  Administered 2021-06-05: 30 mg via INTRAVENOUS
  Filled 2021-06-03 (×2): qty 30

## 2021-06-03 MED ORDER — DIPHENHYDRAMINE HCL 25 MG PO CAPS
25.0000 mg | ORAL_CAPSULE | ORAL | Status: DC | PRN
  Administered 2021-06-03 – 2021-06-05 (×4): 25 mg via ORAL
  Filled 2021-06-03 (×4): qty 1

## 2021-06-03 MED ORDER — LORAZEPAM 2 MG/ML IJ SOLN: 0.5000 mg | Freq: Four times a day (QID) | INTRAMUSCULAR | Status: DC | PRN

## 2021-06-03 MED ORDER — HYDROMORPHONE HCL 1 MG/ML IJ SOLN: 1.0000 mg | INTRAMUSCULAR | Status: DC | PRN

## 2021-06-03 MED ORDER — SODIUM CHLORIDE 0.9% FLUSH: 9.0000 mL | INTRAVENOUS | Status: DC | PRN

## 2021-06-03 MED ORDER — ACETAMINOPHEN 325 MG PO TABS: 650.0000 mg | ORAL_TABLET | Freq: Four times a day (QID) | ORAL | Status: DC | PRN

## 2021-06-03 MED ORDER — DEFERASIROX 360 MG PO TABS: 3.0000 | ORAL_TABLET | Freq: Every day | ORAL | Status: DC

## 2021-06-03 MED ORDER — DIPHENHYDRAMINE HCL 50 MG/ML IJ SOLN
12.5000 mg | Freq: Four times a day (QID) | INTRAMUSCULAR | Status: DC | PRN
  Administered 2021-06-03 (×2): 12.5 mg via INTRAVENOUS
  Filled 2021-06-03 (×2): qty 1

## 2021-06-03 MED ORDER — HYDROMORPHONE HCL 1 MG/ML IJ SOLN
1.0000 mg | INTRAMUSCULAR | Status: DC | PRN
  Administered 2021-06-03: 04:00:00 1 mg via INTRAVENOUS
  Filled 2021-06-03: qty 1

## 2021-06-03 MED ORDER — KETOROLAC TROMETHAMINE 15 MG/ML IJ SOLN
15.0000 mg | Freq: Four times a day (QID) | INTRAMUSCULAR | Status: DC
  Administered 2021-06-04 – 2021-06-07 (×16): 15 mg via INTRAVENOUS
  Filled 2021-06-03 (×16): qty 1

## 2021-06-03 MED ORDER — SENNOSIDES-DOCUSATE SODIUM 8.6-50 MG PO TABS
1.0000 | ORAL_TABLET | Freq: Two times a day (BID) | ORAL | Status: DC
  Administered 2021-06-03 – 2021-06-07 (×9): 1 via ORAL
  Filled 2021-06-03 (×9): qty 1

## 2021-06-03 MED ORDER — POLYETHYLENE GLYCOL 3350 17 G PO PACK: 17.0000 g | PACK | Freq: Every day | ORAL | Status: DC | PRN

## 2021-06-03 MED ORDER — ACETAMINOPHEN 650 MG RE SUPP: 650.0000 mg | Freq: Four times a day (QID) | RECTAL | Status: DC | PRN

## 2021-06-03 MED ORDER — HYDROMORPHONE HCL 2 MG/ML IJ SOLN
2.0000 mg | Freq: Once | INTRAMUSCULAR | Status: AC
  Administered 2021-06-03: 01:00:00 2 mg via INTRAVENOUS
  Filled 2021-06-03: qty 1

## 2021-06-03 MED ORDER — SODIUM CHLORIDE 0.9 % IV SOLN
12.5000 mg | Freq: Once | INTRAVENOUS | Status: AC
  Administered 2021-06-03: 01:00:00 12.5 mg via INTRAVENOUS
  Filled 2021-06-03: qty 12.5

## 2021-06-03 MED ORDER — RIVAROXABAN 20 MG PO TABS
20.0000 mg | ORAL_TABLET | Freq: Every day | ORAL | Status: DC
  Administered 2021-06-03 – 2021-06-07 (×5): 20 mg via ORAL
  Filled 2021-06-03 (×6): qty 1

## 2021-06-03 MED ORDER — ALBUTEROL SULFATE (2.5 MG/3ML) 0.083% IN NEBU: 2.5000 mg | INHALATION_SOLUTION | RESPIRATORY_TRACT | Status: DC | PRN

## 2021-06-03 NOTE — ED Notes (Signed)
Breakfast provided.

## 2021-06-03 NOTE — Progress Notes (Addendum)
Praise Stennett is a 40 year old female with a medical history significant for sickle cell disease, chronic pain syndrome, opiate dependence and tolerance, history of mild intermittent asthma, history of anemia of chronic disease, and history of PE on Xarelto was admitted overnight for sickle cell crisis.  Patient continues to complain of pain primarily to chest and low back.  Her pain intensity is 8/10.  Patient's chest x-ray showed no active cardiopulmonary disease.  She denies headache, dizziness, shortness of breath, urinary symptoms, vomiting, or diarrhea.  Care plan: Will decrease IV fluids to KVO Initiate IV Dilaudid PCA as inpatient room becomes available Decrease Toradol to 15 mg every 6 hours Discontinue IV Ativan, Zofran 4 mg IV every 6 hours as needed for nausea  Sickle cell team will reassess in [place], MSN, FNP-C Patient [place] [address] [place], [place] [address] [phone]

## 2021-06-03 NOTE — H&P (Signed)
History and Physical    PLEASE NOTE THAT DRAGON DICTATION SOFTWARE WAS USED IN THE CONSTRUCTION OF THIS NOTE.   Ref[place]io [place] QIW:[id number] DOB: 07-Mar-1981 DOA: 06/02/2021  PCP: Vevelyn Francois, NP  Patient coming from: home   I have personally briefly reviewed patient's old medical records in [place]  Chief Complaint: Chest pain  HPI: Molly Gutierrez is a 40 y.o. 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   The patient reports 2 days of chest discomfort as well as pain in her lower extremity, in distribution, quality, and intensity consistent with her previous episodes of sickle cell pain crises, with multiple prior hospitalizations for associated pain control.  She describes the chest pain is sharp, midline, anterior chest discomfort, without radiation.  Pain has been constant since onset, and the patient denies any exacerbation with exertion, or improvement with rest.  Pain is nonpositional, nonpleuritic, and not reproducible with direct palpation of the anterior chest wall.  Not associate any shortness of breath, palpitations, diaphoresis, dizziness, presyncope, or syncope.  Additionally, she describes her left lower extremity discomfort is sharp and constant over the last 2 days, associated with the anterior aspect of the left lower extremity distal to the left knee, without radiation.  No preceding trauma.  She denies any associated calf tenderness or lower extremity erythema.  She notes progression of the associated intensity relating to her chest pain and left lower extremity discomfort over the last 2 days in spite of attempts to manage her pain via home analgesic regimen that  includes scheduled MS Contin as well as prn oral Dilaudid.  In the context of this worsening discomfort, the patient presents to [facility] emergency department today for further evaluation and management thereof.  Denies any recent subjective fever, chills, rigors, generalized myalgias.  No recent cough, hemoptysis, headache, neck stiffness, abdominal pain, nausea, vomiting, diarrhea, dysuria, gross hematuria.  Denies alcohol consumption or use of recreational drugs.  She states that she is a lifelong non-smoker.  Per chart review, it appears that her baseline hemoglobin ranges 6.5-8.5, with most recent 2 hemoglobin data points noted to be 6.2 on the morning of 04/27/2021, with most recent value of seven-point 7 in the evening 04/27/2021.  She also has a history of recurrent pulmonary emboli, prompting initiation of chronic anticoagulation on Xarelto, with which she reports outstanding compliance, without any recent missed doses.  She also has a history of mild intermittent asthma, and denies any recent wheezing or shortness of breath.     ED Course:  Vital signs in the ED were notable for the following: Temperature max 98.8, heart rate 71-94; blood pressure 115/77 -128/96; respiratory rate 14-19, oxygen saturation 97% on room air.  Labs were notable for the following: CMP notable for the following: Creatinine 0.54 compared to most recent prior value of 0.57 on 04/27/2021, total bilirubin 4.9, otherwise liver enzymes were found to be within normal limits.  CBC notable for white blood cell count 13,900 compared to most recent prior value of 15,100 on 04/27/2021, hemoglobin 7.5 associated with normocytic/normochromic findings, platelet count 557.  Reticulocyte count percentage noted to be 23%.  COVID-19/influenza PCR were found to be negative when checked in the ED today.  Imaging and additional notable ED  work-up: EKG, in comparison to most recent prior from 04/25/2021 shows sinus rhythm with heart  rate 83, normal intervals, T wave flattening in V1/V2, unchanged from most recent prior EKG, no evidence of ST changes, including no evidence of ST elevation.  Chest x-ray shows no evidence of acute cardiopulmonary process, including no evidence of infiltrate, edema, effusion, or pneumothorax.  While in the ED, the following were administered: Dilaudid 2 mg IV x3 doses; half-normal saline at 125 cc/h, Phenergan 12.5 mg IV x1, Benadryl 25 mg IV x1, Zofran 4 mg IV x1.  Patient noted no significant improvement in her presenting chest/left lower extremity discomfort following the above measures.  Consequently, she is being admitted for further evaluation and management of sickle cell pain crisis, including optimization of associated pain control.      Review of Systems: As per HPI otherwise 10 point review of systems negative.   Past Medical History:  Diagnosis Date   Asthma    Eczema    History of pulmonary embolus (PE)    Sickle cell anemia ([place])     Past Surgical History:  Procedure Laterality Date   CHOLECYSTECTOMY     ERCP     JOINT REPLACEMENT     PORTA CATH INSERTION     TUBAL LIGATION     WISDOM TOOTH EXTRACTION      Social History:  reports that she has never smoked. She has never used smokeless tobacco. She reports that she does not drink alcohol and does not use drugs.   Allergies  Allergen Reactions   Cefaclor Hives and Swelling   Hydroxyurea Palpitations and Other (See Comments)    Lowers "blood levels" and heart rate (causes HYPOtension); "it messes me up, it drops my levels and stuff"    Omeprazole Anaphylaxis and Other (See Comments)    Causes "sharp pains in the stomach"   Tomato Other (See Comments)    Break out    Ketamine Palpitations and Other (See Comments)    "Pt states she has had previous reaction to ketamine. States she becomes flushed, heart races, dizzy, and feels like she is going to pass out."    Family History  Problem Relation Age of Onset    Renal Disease Mother    Hypertension Mother    High Cholesterol Mother    Heart attack Mother    Diabetes Brother     Prior to Admission medications   Medication Sig Start Date End Date Taking? Authorizing Provider  albuterol (VENTOLIN HFA) 108 (90 Base) MCG/ACT inhaler Inhale 2 puffs into the lungs 2 (two) times daily as needed for wheezing or shortness of breath. 04/13/20 04/25/21  Vevelyn Francois, NP  celecoxib (CELEBREX) 200 MG capsule Take 200 mg by mouth daily as needed for moderate pain or mild pain. 02/09/21   [provider]  Deferasirox 360 MG TABS Take 3 tablets by mouth at bedtime. 04/23/21   [provider]  diphenhydrAMINE (BENADRYL) 25 mg capsule Take 25 mg by mouth 3 (three) times daily as needed for itching.    [provider]  fluticasone (FLONASE) 50 MCG/ACT nasal spray Place 1 spray into both nostrils daily as needed for allergies or rhinitis.    [provider]  HYDROmorphone (DILAUDID) 4 MG tablet Take 1 tablet (4 mg total) by mouth every 6 (six) hours as needed for up to 15 days for moderate pain or severe pain. 05/30/21 06/14/21  Vevelyn Francois, NP  mirtazapine (REMERON) 45 MG tablet Take  1 tablet (45 mg total) by mouth at bedtime. 04/20/21 04/20/22  Vevelyn Francois, NP  mometasone-formoterol (DULERA) 100-5 MCG/ACT AERO Inhale 2 puffs into the lungs daily as needed for wheezing or shortness of breath. 04/13/20 04/25/21  Vevelyn Francois, NP  morphine (MS CONTIN) 15 MG 12 hr tablet Take 1 tablet (15 mg total) by mouth every 12 (twelve) hours. Must last 30 days. 06/01/21 07/01/21  Vevelyn Francois, NP  omeprazole (PRILOSEC) 20 MG capsule Take 1 capsule (20 mg total) by mouth 2 (two) times daily before a meal. 04/23/21 04/23/22  Vevelyn Francois, NP  polyethylene glycol (MIRALAX / GLYCOLAX) 17 g packet Take 17 g by mouth daily as needed for mild constipation.    [provider]  promethazine (PHENERGAN) 25 MG tablet Take 0.5-1 tablets  (12.5-25 mg total) by mouth every 6 (six) hours as needed for nausea or vomiting. 10/25/20 10/25/21  Vevelyn Francois, NP  voxelotor (OXBRYTA) 500 MG TABS tablet Take 1,500 mg by mouth at bedtime.    [provider]  XARELTO 20 MG TABS tablet Take 1 tablet (20 mg total) by mouth daily with supper. 04/20/21 04/20/22  Vevelyn Francois, NP     Objective    Physical Exam: Vitals:   06/03/21 0030 06/03/21 0045 06/03/21 0100 06/03/21 0119  BP: 123/87 112/79 108/77 96/63  Pulse: 83 71 72 72  Resp: (!) 29 19 (!) 7 14  Temp:      TempSrc:      SpO2: 99% 100% 99% 97%  Weight:      Height:        General: appears to be stated age; alert, oriented Skin: warm, dry, no rash Head:  AT/[place] Mouth:  Oral mucosa membranes appear dry, normal dentition Neck: supple; trachea midline Heart:  RRR; did not appreciate any M/R/G Lungs: CTAB, did not appreciate any wheezes, rales, or rhonchi Abdomen: + BS; soft, ND, NT Vascular: 2+ pedal pulses b/l; 2+ radial pulses b/l Extremities: no peripheral edema, no muscle wasting Neuro: strength and sensation intact in upper and lower extremities b/l     Labs on Admission: I have personally reviewed following labs and imaging studies  CBC: Recent Labs  Lab 06/02/21 2225  WBC 13.9*  NEUTROABS 8.8*  HGB 7.5*  HCT 21.5*  MCV 92.3  PLT 675*   Basic Metabolic Panel: Recent Labs  Lab 06/02/21 2225  NA 138  K 3.6  CL 105  CO2 26  GLUCOSE 95  BUN 7  CREATININE 0.54  CALCIUM 9.0   GFR: Estimated Creatinine Clearance: 77.3 mL/min (by C-G formula based on SCr of 0.54 mg/dL). Liver Function Tests: Recent Labs  Lab 06/02/21 2225  AST 37  ALT 17  ALKPHOS 51  BILITOT 4.9*  PROT 7.9  ALBUMIN 4.4   No results for input(s): LIPASE, AMYLASE in the last 168 hours. No results for input(s): AMMONIA in the last 168 hours. Coagulation Profile: No results for input(s): INR, PROTIME in the last 168 hours. Cardiac Enzymes: No results for input(s):  CKTOTAL, CKMB, CKMBINDEX, TROPONINI in the last 168 hours. BNP (last 3 results) No results for input(s): PROBNP in the last 8760 hours. HbA1C: No results for input(s): HGBA1C in the last 72 hours. CBG: No results for input(s): GLUCAP in the last 168 hours. Lipid Profile: No results for input(s): CHOL, HDL, LDLCALC, TRIG, CHOLHDL, LDLDIRECT in the last 72 hours. Thyroid Function Tests: No results for input(s): TSH, T4TOTAL, FREET4, T3FREE, THYROIDAB in the  last 72 hours. Anemia Panel: Recent Labs    06/02/21 2225  RETICCTPCT 23.0*   Urine analysis:    Component Value Date/Time   COLORURINE YELLOW 04/25/2021 0152   APPEARANCEUR HAZY (A) 04/25/2021 0152   LABSPEC 1.012 04/25/2021 0152   PHURINE 6.0 04/25/2021 0152   GLUCOSEU NEGATIVE 04/25/2021 0152   HGBUR NEGATIVE 04/25/2021 0152   BILIRUBINUR NEGATIVE 04/25/2021 0152   BILIRUBINUR neg 08/03/2020 1429   KETONESUR NEGATIVE 04/25/2021 0152   PROTEINUR 30 (A) 04/25/2021 0152   UROBILINOGEN 1.0 08/03/2020 1429   NITRITE NEGATIVE 04/25/2021 0152   LEUKOCYTESUR TRACE (A) 04/25/2021 0152    Radiological Exams on Admission: DG Chest 2 View  Result Date: 06/02/2021 CLINICAL DATA:  Chest pain, asthma, sickle cell disease EXAM: CHEST - 2 VIEW COMPARISON:  04/25/2021 bilateral internal jugular chest FINDINGS: Lungs are well expanded, symmetric, and clear. No pneumothorax or pleural effusion. Cardiac size within normal limits. Port are unchanged with the tips within the superior cavoatrial junction and right atrium. Pulmonary vascularity is normal. Osseous structures are age-appropriate. No acute bone abnormality. IMPRESSION: No active cardiopulmonary disease. Electronically Signed   By: Fidela Salisbury M.D.   On: 06/02/2021 22:13     EKG: Independently reviewed, with result as described above.    Assessment/Plan    Principal Problem:   Sickle cell pain crisis ([place]) Active Problems:   Leukocytosis   Asthma   Hx of pulmonary  embolism (HCC)   Anemia   Atypical chest pain      #) Acute Sickle Cell Pain Crisis: In the setting of a history of known sickle cell disease, the patient presents with chest discomfort and left lower extremity pain in a distribution similar to that of previous sickle cell pain crises, with labs reflecting Hgb 7.5 compared to most recent prior value of 7.7 and elevated reticulocyte count percentage of 23%.  Chest x-ray shows no evidence of acute cardiopulmonary process, including no evidence of pneumothorax, and EKG shows no evidence of acute ischemic changes.  Clinically, no evidence to suggest acute chest syndrome at this time, we will closely monitor ensuing clinical trend, serial troponin, will focusing on optimization associated pain control.,  Presenting chest pain, which is nonexertional, appears atypical for ACS.  Additionally, patient's chest pain is felt to be unlikely to be on the basis of acute pulmonary embolism given her reported compliance with chronic anticoagulation on Xarelto.   In terms of precipitating factors leading to this episode of acute sickle cell pain crisis, there is suspicion for reflex vasospasm as a result of recent decline in ambient temperatures.  There may also be an element of mild dehydration, for which the patient is receiving gentle IV fluids via half-normal saline.  No overt evidence of underlying infectious process at this time, including negative chest x-ray as well as negative COVID-19/influenza PCR. Will check UA to further assess.  As her pain remained poorly controlled after multiple doses of IV analgesics in the ED today, will admit and aggressively treat pain, as further described below, with close monitoring for development of respiratory depression. Following my discussions with the patient regarding pain control options that include prn IV Dilaudid versus Dilaudid PCA, the patient conveys preference for proceeding with Dilaudid PCA.  Consequently, an in  the setting of opioid resistance, will initiate Dilaudid PCA with specific settings within the following parameters, as further quantified below: 0.01 - 0.02 mg/kg with 10 minute lockout interval and 1 hour limit of 0.06 mg/kg.  Criteria not  met for transfusion at this time.     Plan: Repeat reticulocyte count and CBC in the morning.  Type and screen. Monitor on telemetry. Sickle cell pain assessment per protocol. Aggressive opioid analgesia in the form of Dilaudid PCA, within the parameters outlined above, and with the following initial settings: bolus dose of 0.3 mg with 10 minute lockout interval and 1 hour limit of 3.0 mg. close monitoring for development of respiratory depression. Monitor continuous pulse oximetry. Prn supplemental O2 in order to maintain O2 sats between 90-92%, with care to not over-oxygenate with supplemental O2 as this can suppress bone marrow production of rbc's.  Prn K pad to the affected area. Gentle IVF's via continuous half NS with caution to not induce volume overload.  Monitor strict I's and O's.  Close monitoring of renal function, including repeat BMP in the morning. incentive spirometry to decrease risk of development of atelectasis. Prn Benadryl for pruritus.  Trend troponin.  Prn IV Toradol.  Check UA.  Type and screen ordered.       #) Leukocytosis: Presenting WBC of: 13,900, which appears reactive/inflammatory in setting of acute sickle cell pain crises.  No evidence of underlying infectious process at this time, as detailed above, and criteria for sepsis not met at this time.  Plan: Further evaluation and management of presenting acute sickle cell pain crisis, as above.  Continuous half NS, as above.  Monitor strict I's and O's and daily weights.  Check urinalysis.  Repeat CBC with differential in the AM.        #) History of recurrent pulmonary emboli: Documented history of such, prompting initiation of chronic anticoagulation on Xarelto, with which the  patient reports outstanding compliance and no missed doses.  Her presenting chest pain appears consistent with that which she is experienced during previous episodes of acute sickle cell pain crises, with this discomfort less suggestive of acute pulmonary embolism, which is overall, felt to be much less likely given her reported outstanding compliance via chronic anticoagulation with Xarelto.  No evidence of hypotension.   Plan: Continue chronic anticoagulation with Xarelto.  Further evaluation management of acute sickle cell pain crisis, as above.  Monitor on telemetry as well as continuous pulse oximetry.  Repeat CBC in the morning.       #) Mild intermittent asthma: Documented history of such, without clinical evidence to suggest acute exacerbation at this time.  Plan: Add on serum magnesium level.  Prn albuterol nebulizer.  CMP/CBC in the morning.  Monitor on continuous pulse oximetry.      DVT prophylaxis: Continue home Xarelto Code Status: Full code Family Communication: none Disposition Plan: Per Rounding Team Consults called: none;  Admission status: Observation; PCU   PLEASE NOTE THAT DRAGON DICTATION SOFTWARE WAS USED IN THE CONSTRUCTION OF THIS NOTE.   [place] DO [facility] [place]   06/03/2021, 1:55 AM

## 2021-06-03 NOTE — ED Notes (Signed)
Lunch provided.

## 2021-06-04 DIAGNOSIS — I2699 Other pulmonary embolism without acute cor pulmonale: Secondary | ICD-10-CM | POA: Diagnosis not present

## 2021-06-04 DIAGNOSIS — D72829 Elevated white blood cell count, unspecified: Secondary | ICD-10-CM

## 2021-06-04 DIAGNOSIS — J4520 Mild intermittent asthma, uncomplicated: Secondary | ICD-10-CM | POA: Diagnosis not present

## 2021-06-04 DIAGNOSIS — R0789 Other chest pain: Secondary | ICD-10-CM

## 2021-06-04 DIAGNOSIS — D649 Anemia, unspecified: Secondary | ICD-10-CM | POA: Diagnosis not present

## 2021-06-04 DIAGNOSIS — D5700 Hb-SS disease with crisis, unspecified: Principal | ICD-10-CM

## 2021-06-04 MED ORDER — DIPHENHYDRAMINE HCL 50 MG/ML IJ SOLN
12.5000 mg | Freq: Once | INTRAMUSCULAR | Status: AC | PRN
  Administered 2021-06-05: 02:00:00 12.5 mg via INTRAVENOUS
  Filled 2021-06-04: qty 1

## 2021-06-04 NOTE — Progress Notes (Signed)
°  Transition of Care [facility]) Screening Note   Patient Details  Name: Molly Gutierrez Date of Birth: 17-Jun-1981   Transition of [facility]) CM/SW Contact:    Makara Lanzo, Marjie Skiff, RN Phone Number: 06/04/2021, 12:48 PM    Transition of Care Department [facility]) has reviewed patient and no TOC needs have been identified at this time. We will continue to monitor patient advancement through interdisciplinary progression rounds. If new patient transition needs arise, please place a TOC consult.

## 2021-06-04 NOTE — Plan of Care (Signed)
  Problem: Clinical Measurements: Goal: Will remain free from infection Outcome: Progressing   Problem: Clinical Measurements: Goal: Diagnostic test results will improve Outcome: Progressing   Problem: Clinical Measurements: Goal: Respiratory complications will improve Outcome: Progressing   

## 2021-06-04 NOTE — Progress Notes (Signed)
°   06/04/21 1117  Assess: MEWS Score  Resp 10  SpO2 95 %  O2 Device Nasal Cannula  O2 Flow Rate (L/min) 2.5 L/min  FiO2 (%) (!) 0 %  Assess: MEWS Score  MEWS Temp 0  MEWS Systolic 1  MEWS Pulse 0  MEWS RR 1  MEWS LOC 0  MEWS Score 2  MEWS Score Color Yellow  Assess: if the MEWS score is Yellow or Red  Were vital signs taken at a resting state? Yes (pt was sleeping)  Focused Assessment No change from prior assessment  Does the patient meet 2 or more of the SIRS criteria? No  MEWS guidelines implemented *See Row Information* Yes  Treat  MEWS Interventions Administered scheduled meds/treatments  Pain Scale 0-10  Pain Score Asleep  Pain Type  (asleep)  Take Vital Signs  Increase Vital Sign Frequency  Yellow: Q 2hr X 2 then Q 4hr X 2, if remains yellow, continue Q 4hrs  Escalate  MEWS: Escalate Yellow: discuss with charge nurse/RN and consider discussing with provider and RRT  Notify: Charge Nurse/RN  Name of Charge Nurse/RN Notified Mardi Mainland  Date Charge Nurse/RN Notified 06/04/21  Time Charge Nurse/RN Notified 1117  Notify: Provider  Provider Name/Title Dr Doreene Burke  Date Provider Notified 06/04/21  Time Provider Notified 1125  Notification Type Face-to-face  Notification Reason Other (Comment) (yellow mews)  Provider response In department;No new orders  Date of Provider Response 06/04/21  Time of Provider Response 1125  Document  Patient Outcome Other (Comment) (when patient woke up her respirations increased)  Progress note created (see row info) Yes  Assess: SIRS CRITERIA  SIRS Temperature  0  SIRS Pulse 0  SIRS Respirations  0  SIRS WBC 0  SIRS Score Sum  0

## 2021-06-04 NOTE — Progress Notes (Signed)
Patient ID: Molly Gutierrez, female   DOB: 1980-10-28, 40 y.o.   MRN: [id number] Subjective: Molly Gutierrez is a 40 year old female with a medical history significant for sickle cell disease, chronic pain syndrome, opiate dependence and tolerance, history of mild intermittent asthma, history of anemia of chronic disease, and history of PE on Xarelto was admitted overnight for sickle cell crisis.  Patient said she is doing slightly better today but still having significant pain in her chest and lower back.  She said her pain is at 7/10 this morning.  She denies any cough or shortness of breath.  No nausea, vomiting or diarrhea.  Objective:  Vital signs in last 24 hours:  Vitals:   06/04/21 0500 06/04/21 0948 06/04/21 1117 06/04/21 1158  BP:  (!) 84/61    Pulse:  81    Resp:  14 10 10   Temp:  98.4 F (36.9 C)    TempSrc:  Oral    SpO2:  96% 95% 96%  Weight: 58.7 kg     Height:        Intake/Output from previous day:   Intake/Output Summary (Last 24 hours) at 06/04/2021 1338 Last data filed at 06/04/2021 0900 Gross per 24 hour  Intake 2207 ml  Output 625 ml  Net 1582 ml    Physical Exam: General: Alert, awake, oriented x3, in no acute distress.  HEENT: [place]/AT PEERL, EOMI Neck: Trachea midline,  no masses, no thyromegal,y no JVD, no carotid bruit OROPHARYNX:  Moist, No exudate/ erythema/lesions.  Heart: Regular rate and rhythm, without murmurs, rubs, gallops, PMI non-displaced, no heaves or thrills on palpation.  Lungs: Clear to auscultation, no wheezing or rhonchi noted. No increased vocal fremitus resonant to percussion  Abdomen: Soft, nontender, nondistended, positive bowel sounds, no masses no hepatosplenomegaly noted..  Neuro: No focal neurological deficits noted cranial nerves II through XII grossly intact. DTRs 2+ bilaterally upper and lower extremities. Strength 5 out of 5 in bilateral upper and lower extremities. Musculoskeletal: No warm swelling or erythema  around joints, no spinal tenderness noted. Psychiatric: Patient alert and oriented x3, good insight and cognition, good recent to remote recall. Lymph node survey: No cervical axillary or inguinal lymphadenopathy noted.  Lab Results:  Basic Metabolic Panel:    Component Value Date/Time   NA 135 06/03/2021 0510   NA 137 08/02/2020 1538   K 3.7 06/03/2021 0510   CL 105 06/03/2021 0510   CO2 25 06/03/2021 0510   BUN 7 06/03/2021 0510   BUN 7 08/02/2020 1538   CREATININE 0.58 06/03/2021 0510   GLUCOSE 92 06/03/2021 0510   CALCIUM 8.4 (L) 06/03/2021 0510   CBC:    Component Value Date/Time   WBC 15.8 (H) 06/03/2021 0510   HGB 7.3 (L) 06/03/2021 0510   HGB 7.8 (L) 08/02/2020 1538   HCT 21.0 (L) 06/03/2021 0510   HCT 22.3 (L) 08/02/2020 1538   PLT 564 (H) 06/03/2021 0510   PLT 560 (H) 08/02/2020 1538   MCV 92.5 06/03/2021 0510   MCV 92 08/02/2020 1538   NEUTROABS 8.3 (H) 06/03/2021 0510   NEUTROABS 9.5 (H) 08/02/2020 1538   LYMPHSABS 4.8 (H) 06/03/2021 0510   LYMPHSABS 3.0 08/02/2020 1538   MONOABS 2.2 (H) 06/03/2021 0510   EOSABS 0.3 06/03/2021 0510   EOSABS 0.4 08/02/2020 1538   BASOSABS 0.1 06/03/2021 0510   BASOSABS 0.1 08/02/2020 1538    Recent Results (from the past 240 hour(s))  Resp Panel by RT-PCR (Flu A&B, Covid) Nasopharyngeal Swab  Status: None   Collection Time: 06/02/21  9:41 PM   Specimen: Nasopharyngeal Swab; Nasopharyngeal(NP) swabs in vial transport medium  Result Value Ref Range Status   SARS Coronavirus 2 by RT PCR NEGATIVE NEGATIVE Final    Comment: (NOTE) SARS-CoV-2 target nucleic acids are NOT DETECTED.  The SARS-CoV-2 RNA is generally detectable in upper respiratory specimens during the acute phase of infection. The lowest concentration of SARS-CoV-2 viral copies this assay can detect is 138 copies/mL. A negative result does not preclude SARS-Cov-2 infection and should not be used as the sole basis for treatment or other patient management  decisions. A negative result may occur with  improper specimen collection/handling, submission of specimen other than nasopharyngeal swab, presence of viral mutation(s) within the areas targeted by this assay, and inadequate number of viral copies(<138 copies/mL). A negative result must be combined with clinical observations, patient history, and epidemiological information. The expected result is Negative.  Fact Sheet for Patients:  EntrepreneurPulse.com.au  Fact Sheet for Healthcare Providers:  IncredibleEmployment.be  This test is no t yet approved or cleared by the [place] FDA and  has been authorized for detection and/or diagnosis of SARS-CoV-2 by FDA under an Emergency Use Authorization (EUA). This EUA will remain  in effect (meaning this test can be used) for the duration of the COVID-19 declaration under Section 564(b)(1) of the Act, 21 U.S.C.section 360bbb-3(b)(1), unless the authorization is terminated  or revoked sooner.       Influenza A by PCR NEGATIVE NEGATIVE Final   Influenza B by PCR NEGATIVE NEGATIVE Final    Comment: (NOTE) The Xpert Xpress SARS-CoV-2/FLU/RSV plus assay is intended as an aid in the diagnosis of influenza from Nasopharyngeal swab specimens and should not be used as a sole basis for treatment. Nasal washings and aspirates are unacceptable for Xpert Xpress SARS-CoV-2/FLU/RSV testing.  Fact Sheet for Patients: EntrepreneurPulse.com.au  Fact Sheet for Healthcare Providers: IncredibleEmployment.be  This test is not yet approved or cleared by the [place] FDA and has been authorized for detection and/or diagnosis of SARS-CoV-2 by FDA under an Emergency Use Authorization (EUA). This EUA will remain in effect (meaning this test can be used) for the duration of the COVID-19 declaration under Section 564(b)(1) of the Act, 21 U.S.C. section 360bbb-3(b)(1), unless the  authorization is terminated or revoked.  Performed at [facility], [place] [address], [place], [place] [address]     Studies/Results: DG Chest 2 View  Result Date: 06/02/2021 CLINICAL DATA:  Chest pain, asthma, sickle cell disease EXAM: CHEST - 2 VIEW COMPARISON:  04/25/2021 bilateral internal jugular chest FINDINGS: Lungs are well expanded, symmetric, and clear. No pneumothorax or pleural effusion. Cardiac size within normal limits. Port are unchanged with the tips within the superior cavoatrial junction and right atrium. Pulmonary vascularity is normal. Osseous structures are age-appropriate. No acute bone abnormality. IMPRESSION: No active cardiopulmonary disease. Electronically Signed   By: Fidela Salisbury M.D.   On: 06/02/2021 22:13    Medications: Scheduled Meds:  Chlorhexidine Gluconate Cloth  6 each Topical Daily   Deferasirox  3 tablet Oral QHS   HYDROmorphone   Intravenous Q4H   ketorolac  15 mg Intravenous Q6H   rivaroxaban  20 mg Oral Q supper   senna-docusate  1 tablet Oral BID   Continuous Infusions:  sodium chloride 10 mL/hr at 06/03/21 1113   PRN Meds:.acetaminophen **OR** acetaminophen, albuterol, diphenhydrAMINE, naLOXone (NARCAN)  injection, naloxone **AND** sodium chloride flush, ondansetron, polyethylene glycol  Consultants: None  Procedures: None  Antibiotics: None  Assessment/Plan: Principal Problem:   Sickle cell pain crisis ([place]) Active Problems:   Leukocytosis   Sickle cell crisis (HCC)   Asthma   Hx of pulmonary embolism (HCC)   Anemia   Atypical chest pain  Hb Sickle Cell Disease with Pain crisis: Continue IVF at [facility], continue weight based Dilaudid PCA at current setting, continue IV Toradol 15 mg Q 6 H for total of 5 days, continue oral pain medication as ordered.  Monitor vitals very closely, Re-evaluate pain scale regularly, 2 L of Oxygen by [place].  Discontinue cardiac monitoring. Leukocytosis: Most likely due to sickle cell  pain crisis.  There is no fever or any other symptoms or sign of infection or inflammation.  Continue to manage without antibiotics.  Repeat labs in AM. Anemia of Chronic Disease: Hemoglobin is stable at baseline today.  There is no clinical indication for blood transfusion at this time.  We will continue to monitor closely and transfuse as needed. Chronic pain Syndrome: Continue oral home pain medications. History of pulmonary embolism: Continue Xarelto.  Code Status: Full Code Family Communication: N/A Disposition Plan: Not yet ready for discharge  Jola Critzer  If 7PM-7AM, please contact night-coverage.  06/04/2021, 1:38 PM  LOS: 1 day

## 2021-06-05 DIAGNOSIS — D5700 Hb-SS disease with crisis, unspecified: Secondary | ICD-10-CM | POA: Diagnosis not present

## 2021-06-05 LAB — BASIC METABOLIC PANEL
Anion gap: 4 — ABNORMAL LOW (ref 5–15)
BUN: 11 mg/dL (ref 6–20)
CO2: 27 mmol/L (ref 22–32)
Calcium: 8.2 mg/dL — ABNORMAL LOW (ref 8.9–10.3)
Chloride: 104 mmol/L (ref 98–111)
Creatinine, Ser: 0.66 mg/dL (ref 0.44–1.00)
GFR, Estimated: >60 mL/min (ref >60)
Glucose, Bld: 120 mg/dL — ABNORMAL HIGH (ref 70–99)
Potassium: 3.4 mmol/L — ABNORMAL LOW (ref 3.5–5.1)
Sodium: 135 mmol/L (ref 135–145)

## 2021-06-05 LAB — CBC WITH DIFFERENTIAL/PLATELET
Abs Immature Granulocytes: 0.05 10*3/uL (ref 0.00–0.07)
Basophils Absolute: 0 10*3/uL (ref 0.0–0.1)
Basophils Relative: 0 %
Eosinophils Absolute: 0.4 10*3/uL (ref 0.0–0.5)
Eosinophils Relative: 4 %
HCT: 19.6 % — ABNORMAL LOW (ref 36.0–46.0)
Hemoglobin: 6.8 g/dL — CL (ref 12.0–15.0)
Immature Granulocytes: 0 %
Lymphocytes Relative: 23 %
Lymphs Abs: 2.6 10*3/uL (ref 0.7–4.0)
MCH: 32.2 pg (ref 26.0–34.0)
MCHC: 34.7 g/dL (ref 30.0–36.0)
MCV: 92.9 fL (ref 80.0–100.0)
Monocytes Absolute: 1.6 10*3/uL — ABNORMAL HIGH (ref 0.1–1.0)
Monocytes Relative: 14 %
Neutro Abs: 6.6 10*3/uL (ref 1.7–7.7)
Neutrophils Relative %: 59 %
Platelets: 496 10*3/uL — ABNORMAL HIGH (ref 150–400)
RBC: 2.11 MIL/uL — ABNORMAL LOW (ref 3.87–5.11)
RDW: 18.7 % — ABNORMAL HIGH (ref 11.5–15.5)
WBC: 11.3 10*3/uL — ABNORMAL HIGH (ref 4.0–10.5)
nRBC: 0.5 % — ABNORMAL HIGH (ref 0.0–0.2)

## 2021-06-05 MED ORDER — HYDROMORPHONE 1 MG/ML IV SOLN
INTRAVENOUS | Status: DC
  Administered 2021-06-06: 30 mg via INTRAVENOUS
  Administered 2021-06-06: 6 mg via INTRAVENOUS
  Administered 2021-06-06: 4 mg via INTRAVENOUS
  Administered 2021-06-07: 2 mg via INTRAVENOUS
  Administered 2021-06-07: 2.5 mg via INTRAVENOUS
  Administered 2021-06-07: 1.5 mg via INTRAVENOUS
  Administered 2021-06-07: 3.5 mg via INTRAVENOUS
  Administered 2021-06-07: 2 mg via INTRAVENOUS
  Filled 2021-06-05: qty 30

## 2021-06-05 NOTE — Plan of Care (Signed)
  Problem: Education: Goal: Knowledge of General Education information will improve Description: Including pain rating scale, medication(s)/side effects and non-pharmacologic comfort measures Outcome: Progressing   Problem: Health Behavior/Discharge Planning: Goal: Ability to manage health-related needs will improve Outcome: Progressing   Problem: Clinical Measurements: Goal: Diagnostic test results will improve Outcome: Progressing   

## 2021-06-05 NOTE — Progress Notes (Signed)
° ° °  OVERNIGHT PROGRESS REPORT  Patient originally refused PO Benadryl (nausea) A smaller dose was ordered of which she requested RN to "flush it". This was flushed under normal admission methods, not rapidly.  Patient was advised that she will need to follow sickle cell team prescribed med orders/routes in the future.     Gershon Cull MSNA MSN ACNPC-AG Acute Care Nurse Practitioner [place]

## 2021-06-05 NOTE — Progress Notes (Signed)
Subjective: 40 year old female with sickle cell disease who was admitted with sickle cell painful crisis.  Patient still complaining of 8 out of 10 pain in her back and legs.  She is on Dilaudid PCA with a bolus dose of 0.3 mg lockout.  10 minutes and maximum of 3 mg/h.  She complains of this not controlling her pain.  Otherwise no fever or chills no nausea vomiting or diarrhea.  Objective: Vital signs in last 24 hours: Temp:  [97.5 F (36.4 C)-98.6 F (37 C)] 98.1 F (36.7 C) (12/20 0646) Pulse Rate:  [61-92] 61 (12/20 0646) Resp:  [10-17] 17 (12/20 0646) BP: (84-129)/(61-88) 115/72 (12/20 0646) SpO2:  [95 %-99 %] 99 % (12/20 0646) FiO2 (%):  [0 %] 0 % (12/20 0355) Weight:  [56.1 kg] 56.1 kg (12/20 0646) Weight change: -2.635 kg Last BM Date: 06/03/21  Intake/Output from previous day: 12/19 0701 - 12/20 0700 In: 1080 [P.O.:840; I.V.:240] Out: 1325 [Urine:1325] Intake/Output this shift: No intake/output data recorded.  General appearance: alert, cooperative, and appears stated age Neck: no adenopathy, no carotid bruit, no JVD, supple, symmetrical, trachea midline, and thyroid not enlarged, symmetric, no tenderness/mass/nodules Back: symmetric, no curvature. ROM normal. No CVA tenderness. Resp: clear to auscultation bilaterally Cardio: regular rate and rhythm, S1, S2 normal, no murmur, click, rub or gallop GI: soft, non-tender; bowel sounds normal; no masses,  no organomegaly Extremities: extremities normal, atraumatic, no cyanosis or edema  Lab Results: Recent Labs    06/02/21 2225 06/03/21 0510  WBC 13.9* 15.8*  HGB 7.5* 7.3*  HCT 21.5* 21.0*  PLT 557* 564*   BMET Recent Labs    06/02/21 2225 06/03/21 0510  NA 138 135  K 3.6 3.7  CL 105 105  CO2 26 25  GLUCOSE 95 92  BUN 7 7  CREATININE 0.54 0.58  CALCIUM 9.0 8.4*    Studies/Results: No results found.  Medications: I have reviewed the patient's current medications.  Assessment/Plan: A 40 year old  female admitted with sickle cell painful crisis.   #1 sickle cell painful crisis: Patient is still on the Dilaudid PCA but low dose.  I will increase the bolus dose to 0.5 mg.  The maximum dose of 3mg /min.  Continue to reassess pain and redness oral medications.  #2 anemia of chronic disease: H&H appears stable.  Continue with monitoring.  No transfusion at this point.  #3 chronic pain syndrome: Continue home regimen.  #4 leukocytosis: Monitor white count.  #5 dehydration: Hydrate aggressively and monitor.  LOS: 2 days   Neo Yepiz,LAWAL 06/05/2021, 7:48 AM

## 2021-06-06 ENCOUNTER — Ambulatory Visit: Payer: Medicare Other | Admitting: Nurse Practitioner

## 2021-06-06 DIAGNOSIS — D5700 Hb-SS disease with crisis, unspecified: Secondary | ICD-10-CM | POA: Diagnosis not present

## 2021-06-06 NOTE — Progress Notes (Signed)
Subjective: Patient's pain is still at 6 out of 10 but much improved.  Her Dilaudid dose has been increased yesterday.  It seems to be helping her more.  She otherwise has no significant distress.  No fever or chills no nausea vomiting or diarrhea.  Objective: Vital signs in last 24 hours: Temp:  [98.4 F (36.9 C)-99 F (37.2 C)] 98.6 F (37 C) (12/21 1015) Pulse Rate:  [61-79] 61 (12/21 1015) Resp:  [10-18] 15 (12/21 1015) BP: (98-139)/(62-89) 106/62 (12/21 1015) SpO2:  [97 %-100 %] 100 % (12/21 1015) FiO2 (%):  [0 %] 0 % (12/21 0810) Weight:  [56.1 kg] 56.1 kg (12/21 0500) Weight change: 0.035 kg Last BM Date: 06/04/21  Intake/Output from previous day: 12/20 0701 - 12/21 0700 In: 477.8 [P.O.:240; I.V.:237.8] Out: 900 [Urine:900] Intake/Output this shift: No intake/output data recorded.  General appearance: alert, cooperative, and appears stated age Neck: no adenopathy, no carotid bruit, no JVD, supple, symmetrical, trachea midline, and thyroid not enlarged, symmetric, no tenderness/mass/nodules Back: symmetric, no curvature. ROM normal. No CVA tenderness. Resp: clear to auscultation bilaterally Cardio: regular rate and rhythm, S1, S2 normal, no murmur, click, rub or gallop GI: soft, non-tender; bowel sounds normal; no masses,  no organomegaly Extremities: extremities normal, atraumatic, no cyanosis or edema  Lab Results: Recent Labs    06/05/21 1114  WBC 11.3*  HGB 6.8*  HCT 19.6*  PLT 496*    BMET Recent Labs    06/05/21 1114  NA 135  K 3.4*  CL 104  CO2 27  GLUCOSE 120*  BUN 11  CREATININE 0.66  CALCIUM 8.2*     Studies/Results: No results found.  Medications: I have reviewed the patient's current medications.  Assessment/Plan: A 40 year old female admitted with sickle cell painful crisis.   #1 sickle cell painful crisis: Patient is still on the Dilaudid PCA but low dose.  I will continue the bolus dose at 0.5 mg.  The maximum dose of 3mg /min.   Continue to reassess pain and redness oral medications.  Mobilize patient with plan to DC in the next 24 to 48 hours.  #2 anemia of chronic disease: H&H appears stable.  Continue with monitoring.  No transfusion at this point.  #3 chronic pain syndrome: Continue home regimen.  #4 leukocytosis: Monitor white count.  #5 dehydration: Hydrate aggressively and monitor.  LOS: 3 days   Avion Patella,LAWAL 06/06/2021, 12:39 PM

## 2021-06-06 NOTE — Care Management Important Message (Signed)
Important Message  Patient Details IM Letter given to the Patient. Name: Molly Gutierrez MRN: [id number] Date of Birth: Feb 17, 1981   Medicare Important Message Given:  Yes     Kerin Salen 06/06/2021, 10:34 AM

## 2021-06-07 DIAGNOSIS — D5700 Hb-SS disease with crisis, unspecified: Secondary | ICD-10-CM | POA: Diagnosis not present

## 2021-06-07 LAB — CBC WITH DIFFERENTIAL/PLATELET
Abs Immature Granulocytes: 0.04 10*3/uL (ref 0.00–0.07)
Basophils Absolute: 0 10*3/uL (ref 0.0–0.1)
Basophils Relative: 0 %
Eosinophils Absolute: 0.3 10*3/uL (ref 0.0–0.5)
Eosinophils Relative: 3 %
HCT: 18.3 % — ABNORMAL LOW (ref 36.0–46.0)
Hemoglobin: 6.6 g/dL — CL (ref 12.0–15.0)
Immature Granulocytes: 0 %
Lymphocytes Relative: 18 %
Lymphs Abs: 1.9 10*3/uL (ref 0.7–4.0)
MCH: 32.7 pg (ref 26.0–34.0)
MCHC: 36.1 g/dL — ABNORMAL HIGH (ref 30.0–36.0)
MCV: 90.6 fL (ref 80.0–100.0)
Monocytes Absolute: 1.8 10*3/uL — ABNORMAL HIGH (ref 0.1–1.0)
Monocytes Relative: 16 %
Neutro Abs: 6.8 10*3/uL (ref 1.7–7.7)
Neutrophils Relative %: 63 %
Platelets: 442 10*3/uL — ABNORMAL HIGH (ref 150–400)
RBC: 2.02 MIL/uL — ABNORMAL LOW (ref 3.87–5.11)
RDW: 17.6 % — ABNORMAL HIGH (ref 11.5–15.5)
WBC: 10.9 10*3/uL — ABNORMAL HIGH (ref 4.0–10.5)
nRBC: 0.3 % — ABNORMAL HIGH (ref 0.0–0.2)

## 2021-06-07 LAB — HEMOGLOBIN AND HEMATOCRIT, BLOOD
HCT: 23 % — ABNORMAL LOW (ref 36.0–46.0)
Hemoglobin: 8 g/dL — ABNORMAL LOW (ref 12.0–15.0)

## 2021-06-07 LAB — PREPARE RBC (CROSSMATCH)

## 2021-06-07 LAB — COMPREHENSIVE METABOLIC PANEL
ALT: 13 U/L (ref 0–44)
AST: 32 U/L (ref 15–41)
Albumin: 3.7 g/dL (ref 3.5–5.0)
Alkaline Phosphatase: 37 U/L — ABNORMAL LOW (ref 38–126)
Anion gap: 4 — ABNORMAL LOW (ref 5–15)
BUN: 16 mg/dL (ref 6–20)
CO2: 27 mmol/L (ref 22–32)
Calcium: 8.3 mg/dL — ABNORMAL LOW (ref 8.9–10.3)
Chloride: 105 mmol/L (ref 98–111)
Creatinine, Ser: 0.5 mg/dL (ref 0.44–1.00)
GFR, Estimated: >60 mL/min (ref >60)
Glucose, Bld: 101 mg/dL — ABNORMAL HIGH (ref 70–99)
Potassium: 3.6 mmol/L (ref 3.5–5.1)
Sodium: 136 mmol/L (ref 135–145)
Total Bilirubin: 2.9 mg/dL — ABNORMAL HIGH (ref 0.3–1.2)
Total Protein: 6.7 g/dL (ref 6.5–8.1)

## 2021-06-07 MED ORDER — HYDROMORPHONE HCL 4 MG PO TABS: 4.0000 mg | ORAL_TABLET | Freq: Four times a day (QID) | ORAL | 0 refills | Status: DC | PRN

## 2021-06-07 MED ORDER — SODIUM CHLORIDE 0.9% IV SOLUTION: Freq: Once | INTRAVENOUS | Status: AC

## 2021-06-07 MED ORDER — HEPARIN SOD (PORK) LOCK FLUSH 100 UNIT/ML IV SOLN
500.0000 [IU] | Freq: Once | INTRAVENOUS | Status: AC
Start: 2021-06-07 — End: 2021-06-07
  Administered 2021-06-07: 20:00:00 500 [IU] via INTRAVENOUS
  Filled 2021-06-07: qty 5

## 2021-06-07 NOTE — Discharge Summary (Signed)
Physician Discharge Summary  Patient ID: Bryttani Blew MRN: [id number] DOB/AGE: 1981/06/13 40 y.o.  Admit date: 06/02/2021 Discharge date: 06/07/2021  Admission Diagnoses:  Discharge Diagnoses:  Principal Problem:   Sickle cell pain crisis ([place]) Active Problems:   Leukocytosis   Sickle cell crisis ([place])   Asthma   Hx of pulmonary embolism (HCC)   Anemia   Atypical chest pain   Discharged Condition: good  Hospital Course: Patient is a 40 year old female who was admitted with sickle cell painful crisis.  Also history of asthma pulmonary embolism as well as symptomatic anemia.  Patient maintain her numbers and no transfusion was indicated.  Pain management with IV Dilaudid PCA with Toradol was done.  Also transition to oral medications.  She was on oxygen but transitioned off.  At time of discharge she was transitioned to her home regimen and prescription was given.  Consults: None  Significant Diagnostic Studies: labs: CBC and CMP checked.  No indication for transfusion  Treatments: IV hydration and analgesia: acetaminophen and Dilaudid  Discharge Exam: Blood pressure 96/64, pulse 68, temperature 98.6 F (37 C), temperature source Oral, resp. rate 18, height 5\' 3"  (1.6 m), weight 55 kg, last menstrual period 05/22/2021, SpO2 99 %. General appearance: alert, cooperative, appears stated age, and no distress Neck: no adenopathy, no carotid bruit, no JVD, supple, symmetrical, trachea midline, and thyroid not enlarged, symmetric, no tenderness/mass/nodules Back: symmetric, no curvature. ROM normal. No CVA tenderness. Resp: clear to auscultation bilaterally Cardio: regular rate and rhythm, S1, S2 normal, no murmur, click, rub or gallop GI: soft, non-tender; bowel sounds normal; no masses,  no organomegaly Extremities: extremities normal, atraumatic, no cyanosis or edema Pulses: 2+ and symmetric Skin: Skin color, texture, turgor normal. No rashes or lesions Neurologic:  Grossly normal  Disposition: Discharge disposition: 01-Home or Self Care       Discharge Instructions     Diet - low sodium heart healthy   Complete by: As directed    Increase activity slowly   Complete by: As directed       Allergies as of 06/07/2021       Reactions   Cefaclor Hives, Swelling   Hydroxyurea Palpitations, Other (See Comments)   Lowers "blood levels" and heart rate (causes HYPOtension); "it messes me up, it drops my levels and stuff"   Omeprazole Anaphylaxis, Other (See Comments)   Causes "sharp pains in the stomach"   Tomato Other (See Comments)   Break out    Ketamine Palpitations, Other (See Comments)   "Pt states she has had previous reaction to ketamine. States she becomes flushed, heart races, dizzy, and feels like she is going to pass out."        Medication List     TAKE these medications    albuterol 108 (90 Base) MCG/ACT inhaler Commonly known as: VENTOLIN HFA Inhale 2 puffs into the lungs 2 (two) times daily as needed for wheezing or shortness of breath.   Deferasirox 360 MG Tabs Take 1,440 mg by mouth at bedtime.   diphenhydrAMINE 25 mg capsule Commonly known as: BENADRYL Take 25 mg by mouth 3 (three) times daily as needed for itching.   fluticasone 50 MCG/ACT nasal spray Commonly known as: FLONASE Place 1 spray into both nostrils daily as needed for allergies or rhinitis.   folic acid 1 MG tablet Commonly known as: FOLVITE Take 1 mg by mouth daily.   HYDROmorphone 4 MG tablet Commonly known as: DILAUDID Take 1 tablet (4 mg total) by mouth  every 6 (six) hours as needed for up to 15 days for moderate pain or severe pain.   mirtazapine 45 MG tablet Commonly known as: REMERON Take 1 tablet (45 mg total) by mouth at bedtime.   mometasone-formoterol 100-5 MCG/ACT Aero Commonly known as: DULERA Inhale 2 puffs into the lungs daily as needed for wheezing or shortness of breath.   morphine 15 MG 12 hr tablet Commonly known as:  MS CONTIN Take 1 tablet (15 mg total) by mouth every 12 (twelve) hours. Must last 30 days.   omeprazole 20 MG capsule Commonly known as: PRILOSEC Take 1 capsule (20 mg total) by mouth 2 (two) times daily before a meal.   polyethylene glycol 17 g packet Commonly known as: MIRALAX / GLYCOLAX Take 17 g by mouth daily as needed for mild constipation.   promethazine 25 MG tablet Commonly known as: PHENERGAN Take 0.5-1 tablets (12.5-25 mg total) by mouth every 6 (six) hours as needed for nausea or vomiting.   VITAMIN B-12 PO Take 1 tablet by mouth daily.   VITAMIN B-3 PO Take 1 tablet by mouth daily.   Xarelto 20 MG Tabs tablet Generic drug: rivaroxaban Take 1 tablet (20 mg total) by mouth daily with supper.         SignedBarbette Merino 06/07/2021, 10:14 AM  Time spent 34 minutes

## 2021-06-07 NOTE — Progress Notes (Signed)
Date and time results received: 06/07/21 0630  Test: Hemoglobin  Critical Value: 6.6  Name of Provider Notified: Olena Heckle, NP  Orders Received? Or Actions Taken?: Transfuse x1 unit pRBC

## 2021-06-07 NOTE — Progress Notes (Signed)
Made sure pt had received discharge packet, deaccessed pt's port and wasted the rest of PCA syringe with charge RN. Pt said that she could get to the exit by herself, gathered her belongings and walked off the unit.

## 2021-06-08 LAB — TYPE AND SCREEN
ABO/RH(D): A POS
Antibody Screen: NEGATIVE
Unit division: 0

## 2021-06-08 LAB — BPAM RBC
Blood Product Expiration Date: 202301152359
ISSUE DATE / TIME: 202212221239
Unit Type and Rh: 5100

## 2021-06-25 ENCOUNTER — Telehealth: Payer: Self-pay

## 2021-06-25 NOTE — Telephone Encounter (Signed)
Hydromorphone  

## 2021-06-27 ENCOUNTER — Other Ambulatory Visit: Payer: Self-pay | Admitting: Nurse Practitioner

## 2021-06-27 DIAGNOSIS — D571 Sickle-cell disease without crisis: Secondary | ICD-10-CM

## 2021-06-27 DIAGNOSIS — G894 Chronic pain syndrome: Secondary | ICD-10-CM

## 2021-06-27 MED ORDER — HYDROMORPHONE HCL 4 MG PO TABS: 4.0000 mg | ORAL_TABLET | Freq: Four times a day (QID) | ORAL | 0 refills | Status: DC | PRN

## 2021-06-27 NOTE — Telephone Encounter (Signed)
Refill has been sent.  °

## 2021-07-13 ENCOUNTER — Telehealth: Payer: Self-pay

## 2021-07-13 NOTE — Telephone Encounter (Signed)
Hydromorphone  

## 2021-07-16 ENCOUNTER — Other Ambulatory Visit: Payer: Self-pay | Admitting: Nurse Practitioner

## 2021-07-16 DIAGNOSIS — G894 Chronic pain syndrome: Secondary | ICD-10-CM

## 2021-07-16 DIAGNOSIS — D571 Sickle-cell disease without crisis: Secondary | ICD-10-CM

## 2021-07-16 MED ORDER — HYDROMORPHONE HCL 4 MG PO TABS: 4.0000 mg | ORAL_TABLET | Freq: Four times a day (QID) | ORAL | 0 refills | Status: DC | PRN

## 2021-07-16 NOTE — Telephone Encounter (Signed)
The refill has been sent. Thanks

## 2021-07-27 ENCOUNTER — Telehealth: Payer: Self-pay

## 2021-07-27 NOTE — Telephone Encounter (Signed)
Hydromorphone Morphine 

## 2021-07-29 ENCOUNTER — Other Ambulatory Visit: Payer: Self-pay | Admitting: Nurse Practitioner

## 2021-07-29 DIAGNOSIS — G894 Chronic pain syndrome: Secondary | ICD-10-CM

## 2021-07-29 DIAGNOSIS — D571 Sickle-cell disease without crisis: Secondary | ICD-10-CM

## 2021-07-29 MED ORDER — MORPHINE SULFATE ER 15 MG PO TBCR: 15.0000 mg | EXTENDED_RELEASE_TABLET | Freq: Two times a day (BID) | ORAL | 0 refills | Status: DC

## 2021-07-29 MED ORDER — HYDROMORPHONE HCL 4 MG PO TABS: 4.0000 mg | ORAL_TABLET | Freq: Four times a day (QID) | ORAL | 0 refills | Status: DC | PRN

## 2021-07-29 NOTE — Telephone Encounter (Signed)
.  ckr

## 2021-08-07 ENCOUNTER — Telehealth: Payer: Self-pay

## 2021-08-07 NOTE — Telephone Encounter (Signed)
Hydromorphone  

## 2021-08-08 ENCOUNTER — Other Ambulatory Visit: Payer: Self-pay | Admitting: Nurse Practitioner

## 2021-08-08 DIAGNOSIS — G894 Chronic pain syndrome: Secondary | ICD-10-CM

## 2021-08-08 DIAGNOSIS — D571 Sickle-cell disease without crisis: Secondary | ICD-10-CM

## 2021-08-08 MED ORDER — HYDROMORPHONE HCL 4 MG PO TABS: 4.0000 mg | ORAL_TABLET | Freq: Four times a day (QID) | ORAL | 0 refills | Status: DC | PRN

## 2021-08-08 NOTE — Telephone Encounter (Signed)
The refill has been sent. Thanks

## 2021-08-15 ENCOUNTER — Ambulatory Visit: Payer: Medicare HMO | Admitting: Nurse Practitioner

## 2021-08-24 ENCOUNTER — Telehealth: Payer: Self-pay

## 2021-08-24 ENCOUNTER — Other Ambulatory Visit: Payer: Self-pay | Admitting: Nurse Practitioner

## 2021-08-24 DIAGNOSIS — G894 Chronic pain syndrome: Secondary | ICD-10-CM

## 2021-08-24 DIAGNOSIS — D571 Sickle-cell disease without crisis: Secondary | ICD-10-CM

## 2021-08-24 MED ORDER — HYDROMORPHONE HCL 4 MG PO TABS: 4.0000 mg | ORAL_TABLET | Freq: Four times a day (QID) | ORAL | 0 refills | Status: DC | PRN

## 2021-08-24 NOTE — Telephone Encounter (Signed)
Hydromorphone  

## 2021-08-24 NOTE — Telephone Encounter (Signed)
The refill has been sent. Thanks   ?

## 2021-09-05 ENCOUNTER — Ambulatory Visit: Payer: Medicare HMO | Admitting: Nurse Practitioner

## 2021-09-06 ENCOUNTER — Telehealth: Payer: Self-pay

## 2021-09-06 NOTE — Telephone Encounter (Signed)
Hydromorphone Morphine 

## 2021-09-10 ENCOUNTER — Other Ambulatory Visit: Payer: Self-pay | Admitting: Nurse Practitioner

## 2021-09-10 DIAGNOSIS — D571 Sickle-cell disease without crisis: Secondary | ICD-10-CM

## 2021-09-10 DIAGNOSIS — G894 Chronic pain syndrome: Secondary | ICD-10-CM

## 2021-09-10 MED ORDER — MORPHINE SULFATE ER 15 MG PO TBCR: 15.0000 mg | EXTENDED_RELEASE_TABLET | Freq: Two times a day (BID) | ORAL | 0 refills | Status: AC

## 2021-09-10 MED ORDER — HYDROMORPHONE HCL 4 MG PO TABS: 4.0000 mg | ORAL_TABLET | Freq: Four times a day (QID) | ORAL | 0 refills | Status: DC | PRN

## 2021-09-10 NOTE — Telephone Encounter (Signed)
The refill has been sent. Thanks  ? ?

## 2021-09-25 ENCOUNTER — Other Ambulatory Visit: Payer: Self-pay | Admitting: Internal Medicine

## 2021-09-25 ENCOUNTER — Telehealth: Payer: Self-pay

## 2021-09-25 DIAGNOSIS — D571 Sickle-cell disease without crisis: Secondary | ICD-10-CM

## 2021-09-25 DIAGNOSIS — G894 Chronic pain syndrome: Secondary | ICD-10-CM

## 2021-09-25 MED ORDER — HYDROMORPHONE HCL 4 MG PO TABS: 4.0000 mg | ORAL_TABLET | Freq: Four times a day (QID) | ORAL | 0 refills | Status: DC | PRN

## 2021-09-25 NOTE — Telephone Encounter (Signed)
Hydromorphone  

## 2021-10-04 ENCOUNTER — Encounter (HOSPITAL_COMMUNITY): Payer: Self-pay

## 2021-10-04 ENCOUNTER — Inpatient Hospital Stay (HOSPITAL_COMMUNITY): Payer: Medicare HMO

## 2021-10-04 ENCOUNTER — Other Ambulatory Visit: Payer: Self-pay

## 2021-10-04 ENCOUNTER — Inpatient Hospital Stay (HOSPITAL_COMMUNITY)
Admission: EM | Admit: 2021-10-04 | Discharge: 2021-10-08 | DRG: 812 | Disposition: A | Discharge status: Home / Self Care | Payer: Medicare HMO | Attending: Internal Medicine | Admitting: Internal Medicine

## 2021-10-04 DIAGNOSIS — Z87892 Personal history of anaphylaxis: Secondary | ICD-10-CM

## 2021-10-04 DIAGNOSIS — I2699 Other pulmonary embolism without acute cor pulmonale: Secondary | ICD-10-CM | POA: Diagnosis present

## 2021-10-04 DIAGNOSIS — D571 Sickle-cell disease without crisis: Secondary | ICD-10-CM

## 2021-10-04 DIAGNOSIS — Z86711 Personal history of pulmonary embolism: Secondary | ICD-10-CM | POA: Diagnosis not present

## 2021-10-04 DIAGNOSIS — Z881 Allergy status to other antibiotic agents status: Secondary | ICD-10-CM

## 2021-10-04 DIAGNOSIS — Z888 Allergy status to other drugs, medicaments and biological substances status: Secondary | ICD-10-CM

## 2021-10-04 DIAGNOSIS — Z79899 Other long term (current) drug therapy: Secondary | ICD-10-CM

## 2021-10-04 DIAGNOSIS — J4520 Mild intermittent asthma, uncomplicated: Secondary | ICD-10-CM | POA: Diagnosis present

## 2021-10-04 DIAGNOSIS — Z20822 Contact with and (suspected) exposure to covid-19: Secondary | ICD-10-CM | POA: Diagnosis present

## 2021-10-04 DIAGNOSIS — K219 Gastro-esophageal reflux disease without esophagitis: Secondary | ICD-10-CM | POA: Diagnosis present

## 2021-10-04 DIAGNOSIS — Z7901 Long term (current) use of anticoagulants: Secondary | ICD-10-CM

## 2021-10-04 DIAGNOSIS — D5700 Hb-SS disease with crisis, unspecified: Principal | ICD-10-CM | POA: Diagnosis present

## 2021-10-04 DIAGNOSIS — Z91018 Allergy to other foods: Secondary | ICD-10-CM

## 2021-10-04 DIAGNOSIS — G894 Chronic pain syndrome: Secondary | ICD-10-CM

## 2021-10-04 LAB — RETICULOCYTES
Immature Retic Fract: 38.8 % — ABNORMAL HIGH (ref 2.3–15.9)
RBC.: 2.42 MIL/uL — ABNORMAL LOW (ref 3.87–5.11)
Retic Count, Absolute: 219.7 10*3/uL — ABNORMAL HIGH (ref 19.0–186.0)
Retic Ct Pct: 9.1 % — ABNORMAL HIGH (ref 0.4–3.1)

## 2021-10-04 LAB — CBC WITH DIFFERENTIAL/PLATELET
Abs Immature Granulocytes: 0.09 10*3/uL — ABNORMAL HIGH (ref 0.00–0.07)
Basophils Absolute: 0 10*3/uL (ref 0.0–0.1)
Basophils Relative: 0 %
Eosinophils Absolute: 0.2 10*3/uL (ref 0.0–0.5)
Eosinophils Relative: 1 %
HCT: 20 % — ABNORMAL LOW (ref 36.0–46.0)
Hemoglobin: 6.7 g/dL — CL (ref 12.0–15.0)
Immature Granulocytes: 1 %
Lymphocytes Relative: 15 %
Lymphs Abs: 2.1 10*3/uL (ref 0.7–4.0)
MCH: 27.2 pg (ref 26.0–34.0)
MCHC: 33.5 g/dL (ref 30.0–36.0)
MCV: 81.3 fL (ref 80.0–100.0)
Monocytes Absolute: 1.9 10*3/uL — ABNORMAL HIGH (ref 0.1–1.0)
Monocytes Relative: 14 %
Neutro Abs: 9.8 10*3/uL — ABNORMAL HIGH (ref 1.7–7.7)
Neutrophils Relative %: 69 %
Platelets: 855 10*3/uL — ABNORMAL HIGH (ref 150–400)
RBC: 2.46 MIL/uL — ABNORMAL LOW (ref 3.87–5.11)
RDW: 22.7 % — ABNORMAL HIGH (ref 11.5–15.5)
WBC: 14.1 10*3/uL — ABNORMAL HIGH (ref 4.0–10.5)
nRBC: 1.4 % — ABNORMAL HIGH (ref 0.0–0.2)

## 2021-10-04 LAB — URINALYSIS, ROUTINE W REFLEX MICROSCOPIC
Bacteria, UA: NONE SEEN
Bilirubin Urine: NEGATIVE
Glucose, UA: NEGATIVE mg/dL
Ketones, ur: NEGATIVE mg/dL
Nitrite: NEGATIVE
Protein, ur: NEGATIVE mg/dL
Specific Gravity, Urine: 1.009 (ref 1.005–1.030)
pH: 7 (ref 5.0–8.0)

## 2021-10-04 LAB — COMPREHENSIVE METABOLIC PANEL
ALT: 10 U/L (ref 0–44)
AST: 21 U/L (ref 15–41)
Albumin: 4 g/dL (ref 3.5–5.0)
Alkaline Phosphatase: 59 U/L (ref 38–126)
Anion gap: 6 (ref 5–15)
BUN: 6 mg/dL (ref 6–20)
CO2: 25 mmol/L (ref 22–32)
Calcium: 8.8 mg/dL — ABNORMAL LOW (ref 8.9–10.3)
Chloride: 108 mmol/L (ref 98–111)
Creatinine, Ser: 0.57 mg/dL (ref 0.44–1.00)
GFR, Estimated: >60 mL/min (ref >60)
Glucose, Bld: 103 mg/dL — ABNORMAL HIGH (ref 70–99)
Potassium: 3.3 mmol/L — ABNORMAL LOW (ref 3.5–5.1)
Sodium: 139 mmol/L (ref 135–145)
Total Bilirubin: 2.7 mg/dL — ABNORMAL HIGH (ref 0.3–1.2)
Total Protein: 8 g/dL (ref 6.5–8.1)

## 2021-10-04 LAB — I-STAT BETA HCG BLOOD, ED (MC, WL, AP ONLY): I-stat hCG, quantitative: <5 m[IU]/mL (ref <5)

## 2021-10-04 MED ORDER — POTASSIUM CHLORIDE CRYS ER 20 MEQ PO TBCR
40.0000 meq | EXTENDED_RELEASE_TABLET | Freq: Once | ORAL | Status: AC
Start: 2021-10-04 — End: 2021-10-04
  Administered 2021-10-04: 40 meq via ORAL
  Filled 2021-10-04: qty 2

## 2021-10-04 MED ORDER — HYDROMORPHONE HCL 2 MG/ML IJ SOLN
2.0000 mg | INTRAMUSCULAR | Status: AC | PRN
  Administered 2021-10-04 – 2021-10-05 (×2): 2 mg via INTRAVENOUS
  Filled 2021-10-04 (×2): qty 1

## 2021-10-04 MED ORDER — POTASSIUM CHLORIDE IN NACL 20-0.45 MEQ/L-% IV SOLN
INTRAVENOUS | Status: DC
  Filled 2021-10-04 (×10): qty 1000

## 2021-10-04 MED ORDER — ACETAMINOPHEN 325 MG PO TABS
650.0000 mg | ORAL_TABLET | Freq: Four times a day (QID) | ORAL | Status: DC | PRN
  Administered 2021-10-06: 650 mg via ORAL
  Filled 2021-10-04: qty 2

## 2021-10-04 MED ORDER — POLYETHYLENE GLYCOL 3350 17 G PO PACK
17.0000 g | PACK | Freq: Every day | ORAL | Status: DC | PRN
  Administered 2021-10-08: 17 g via ORAL
  Filled 2021-10-04: qty 1

## 2021-10-04 MED ORDER — ONDANSETRON HCL 4 MG PO TABS: 4.0000 mg | ORAL_TABLET | Freq: Four times a day (QID) | ORAL | Status: DC | PRN

## 2021-10-04 MED ORDER — HYDROMORPHONE 1 MG/ML IV SOLN
INTRAVENOUS | Status: DC
  Administered 2021-10-05: 4.5 mg via INTRAVENOUS
  Administered 2021-10-05: 4 mg via INTRAVENOUS
  Administered 2021-10-06: 2.5 mg via INTRAVENOUS
  Administered 2021-10-06: 4 mg via INTRAVENOUS
  Administered 2021-10-06: 6 mg via INTRAVENOUS
  Administered 2021-10-06: 4.5 mg via INTRAVENOUS
  Administered 2021-10-06: 5.5 mg via INTRAVENOUS
  Administered 2021-10-06: 3 mg via INTRAVENOUS
  Administered 2021-10-07: 1 mg via INTRAVENOUS
  Administered 2021-10-07: 4.5 mg via INTRAVENOUS
  Administered 2021-10-07 (×2): 6 mg via INTRAVENOUS
  Administered 2021-10-07: 2.5 mg via INTRAVENOUS
  Administered 2021-10-07: 5 mg via INTRAVENOUS
  Administered 2021-10-08: 7 mg via INTRAVENOUS
  Administered 2021-10-08: 4 mg via INTRAVENOUS
  Filled 2021-10-04 (×4): qty 30

## 2021-10-04 MED ORDER — DEXTROSE-NACL 5-0.45 % IV SOLN: INTRAVENOUS | Status: DC

## 2021-10-04 MED ORDER — ONDANSETRON HCL 4 MG/2ML IJ SOLN: 4.0000 mg | Freq: Four times a day (QID) | INTRAMUSCULAR | Status: DC | PRN

## 2021-10-04 MED ORDER — ONDANSETRON HCL 4 MG/2ML IJ SOLN
4.0000 mg | INTRAMUSCULAR | Status: DC | PRN
  Administered 2021-10-04: 4 mg via INTRAVENOUS
  Filled 2021-10-04: qty 2

## 2021-10-04 MED ORDER — HYDROMORPHONE HCL 2 MG/ML IJ SOLN
2.0000 mg | Freq: Once | INTRAMUSCULAR | Status: AC
  Administered 2021-10-04: 2 mg via INTRAVENOUS
  Filled 2021-10-04: qty 1

## 2021-10-04 MED ORDER — FLUTICASONE PROPIONATE 50 MCG/ACT NA SUSP: 1.0000 | Freq: Every day | NASAL | Status: DC | PRN

## 2021-10-04 MED ORDER — HYDROMORPHONE HCL 2 MG/ML IJ SOLN
2.0000 mg | INTRAMUSCULAR | Status: AC
  Administered 2021-10-04: 2 mg via INTRAVENOUS
  Filled 2021-10-04: qty 1

## 2021-10-04 MED ORDER — MIRTAZAPINE 30 MG PO TABS
45.0000 mg | ORAL_TABLET | Freq: Every day | ORAL | Status: DC
  Administered 2021-10-04 – 2021-10-07 (×4): 45 mg via ORAL
  Filled 2021-10-04: qty 1
  Filled 2021-10-04: qty 2
  Filled 2021-10-04 (×2): qty 1

## 2021-10-04 MED ORDER — RIVAROXABAN 20 MG PO TABS
20.0000 mg | ORAL_TABLET | Freq: Every day | ORAL | Status: DC
  Administered 2021-10-04 – 2021-10-07 (×4): 20 mg via ORAL
  Filled 2021-10-04 (×4): qty 1

## 2021-10-04 MED ORDER — DIPHENHYDRAMINE HCL 25 MG PO CAPS
25.0000 mg | ORAL_CAPSULE | ORAL | Status: DC | PRN
  Administered 2021-10-04: 50 mg via ORAL
  Filled 2021-10-04: qty 2

## 2021-10-04 MED ORDER — ACETAMINOPHEN 650 MG RE SUPP
650.0000 mg | Freq: Four times a day (QID) | RECTAL | Status: DC | PRN
Start: 2021-10-04 — End: 2021-10-08

## 2021-10-04 MED ORDER — MORPHINE SULFATE ER 15 MG PO TBCR
15.0000 mg | EXTENDED_RELEASE_TABLET | Freq: Two times a day (BID) | ORAL | Status: DC
  Administered 2021-10-04 – 2021-10-08 (×8): 15 mg via ORAL
  Filled 2021-10-04 (×8): qty 1

## 2021-10-04 MED ORDER — ALBUTEROL SULFATE (2.5 MG/3ML) 0.083% IN NEBU: 3.0000 mL | INHALATION_SOLUTION | Freq: Two times a day (BID) | RESPIRATORY_TRACT | Status: DC | PRN

## 2021-10-04 MED ORDER — FOLIC ACID 1 MG PO TABS
1.0000 mg | ORAL_TABLET | Freq: Every day | ORAL | Status: DC
  Administered 2021-10-04 – 2021-10-08 (×5): 1 mg via ORAL
  Filled 2021-10-04 (×5): qty 1

## 2021-10-04 MED ORDER — DEFERASIROX 360 MG PO TABS: 1440.0000 mg | ORAL_TABLET | Freq: Every day | ORAL | Status: DC

## 2021-10-04 MED ORDER — PANTOPRAZOLE SODIUM 40 MG PO TBEC
40.0000 mg | DELAYED_RELEASE_TABLET | Freq: Two times a day (BID) | ORAL | Status: DC
  Administered 2021-10-05 – 2021-10-08 (×7): 40 mg via ORAL
  Filled 2021-10-04 (×7): qty 1

## 2021-10-04 MED ORDER — DIPHENHYDRAMINE HCL 25 MG PO CAPS
25.0000 mg | ORAL_CAPSULE | ORAL | Status: DC | PRN
  Administered 2021-10-04 – 2021-10-05 (×2): 25 mg via ORAL
  Filled 2021-10-04 (×2): qty 1

## 2021-10-04 MED ORDER — SODIUM CHLORIDE 0.9% FLUSH: 9.0000 mL | INTRAVENOUS | Status: DC | PRN

## 2021-10-04 MED ORDER — KETOROLAC TROMETHAMINE 15 MG/ML IJ SOLN
15.0000 mg | Freq: Four times a day (QID) | INTRAMUSCULAR | Status: DC | PRN
  Administered 2021-10-05 – 2021-10-06 (×3): 15 mg via INTRAVENOUS
  Filled 2021-10-04 (×3): qty 1

## 2021-10-04 MED ORDER — NALOXONE HCL 0.4 MG/ML IJ SOLN: 0.4000 mg | INTRAMUSCULAR | Status: DC | PRN

## 2021-10-04 NOTE — Assessment & Plan Note (Addendum)
?   Patient presenting with pain consistent with previous sickle cell pain crises ?? No clinical evidence of acute chest syndrome ?? Hemoglobin currently 6.7.  We will abstain from blood transfusion in the absence of acute chest syndrome or hypoxia. ?? Initiating high-dose intravenous opiate-based analgesics via PCA pump ?? Additionally providing patient with as needed Toradol ?? Patient will be initiated on intravenous while resuscitation with half-normal saline ?? Obtaining urinalysis, chest x-ray and COVID-19 testing to evaluate for etiology of the onset of this crisis ?? Monitoring progression of disease with serial CBCs, serial reticulocyte counts, serial bilirubins ? ?

## 2021-10-04 NOTE — Assessment & Plan Note (Signed)
?   Diagnosed back in 2020 ?? Continuing ongoing daily Xarelto ?

## 2021-10-04 NOTE — ED Triage Notes (Signed)
Pt reports back pain that began about 2 days ago. Pt has hx of sickle cell anemia. Pt endorse nausea without vomiting.  ?

## 2021-10-04 NOTE — H&P (Addendum)
?History and Physical  ? ? ?PatientTuwanna Gutierrez MRN: [id number] DOA: 10/04/2021 ? ?Date of Service: the patient was seen and examined on 10/05/2021 ? ?Patient coming from: Home ? ?Chief Complaint:  ?Chief Complaint  ?Patient presents with  ? Sickle Cell Pain Crisis  ? ? ?HPI:  ? ?41 year old female with past medical history of sickle cell anemia, mild intermittent asthma, gastroesophageal reflux disease and history of pulmonary embolism (04/2019) currently on Xarelto who presents to [facility] emergency department with complaints of back pain. ? ?Patient explains that approximately 2 days ago she began to develop low back pain as well as associated left hip pain.  Pain is severe in intensity, sharp in quality without any alleviating or exacerbating factors.  Patient states that this pain is consistent with her previous sickle cell crises.  Patient has been attempting to take her home regimen of opiate-based analgesics without any improvement in symptoms.  Patient denies any associated fevers, sick contacts, recent travel. ? ?Patient's symptoms continue to worsen over the following 2 days until she eventually presented to [facility] emergency department for evaluation. ? ?Upon evaluation in the emergency department urinalysis and chest x-ray were unremarkable.  Hemoglobin was noted to have significantly dropped to 6.7, down from 8.0 in December 2022 however patient denied any chest pain or shortness of breath and exhibited no hypoxia.  Patient was given several doses of intravenous Dilaudid as well as being initiated on intravenous fluids and the hospitalist group was then called to assess the patient for admission to the hospital. ? ?Review of Systems: Review of Systems  ?Musculoskeletal:  Positive for back pain.  ?     Thigh pain ?  ?All other systems reviewed and are negative. ? ? ?Past Medical History:  ?Diagnosis Date  ? Asthma   ? Eczema   ? History of pulmonary embolus (PE)   ?  Sickle cell anemia (HCC)   ? ? ?Past Surgical History:  ?Procedure Laterality Date  ? CHOLECYSTECTOMY    ? ERCP    ? JOINT REPLACEMENT    ? PORTA CATH INSERTION    ? TUBAL LIGATION    ? WISDOM TOOTH EXTRACTION    ? ? ?Social History:  reports that she has never smoked. She has never used smokeless tobacco. She reports that she does not drink alcohol and does not use drugs. ? ?Allergies  ?Allergen Reactions  ? Cefaclor Hives and Swelling  ? Hydroxyurea Palpitations and Other (See Comments)  ?  Lowers "blood levels" and heart rate (causes HYPOtension); ?"it messes me up, it drops my levels and stuff" ?  ? Omeprazole Anaphylaxis and Other (See Comments)  ?  Causes "sharp pains in the stomach"  ? Tomato Other (See Comments)  ?  Break out   ? Ketamine Palpitations and Other (See Comments)  ?  "Pt states she has had previous reaction to ketamine. States she becomes flushed, heart races, dizzy, and feels like she is going to pass out."  ? ? ?Family History  ?Problem Relation Age of Onset  ? Renal Disease Mother   ? Hypertension Mother   ? High Cholesterol Mother   ? Heart attack Mother   ? Diabetes Brother   ? ? ?Prior to Admission medications   ?Medication Sig Start Date End Date Taking? Authorizing Provider  ?albuterol (VENTOLIN HFA) 108 (90 Base) MCG/ACT inhaler Inhale 2 puffs into the lungs 2 (two) times daily as needed for wheezing or shortness of breath. 04/13/20 06/03/21  Vevelyn Francois, NP  ?Cyanocobalamin (VITAMIN B-12 PO) Take 1 tablet by mouth daily.    [provider]  ?Deferasirox 360 MG TABS Take 1,440 mg by mouth at bedtime. 04/23/21   [provider]  ?diphenhydrAMINE (BENADRYL) 25 mg capsule Take 25 mg by mouth 3 (three) times daily as needed for itching.    [provider]  ?fluticasone (FLONASE) 50 MCG/ACT nasal spray Place 1 spray into both nostrils daily as needed for allergies or rhinitis.    [provider]  ?folic acid (FOLVITE) 1 MG tablet Take 1 mg by mouth  daily.    [provider]  ?HYDROmorphone (DILAUDID) 4 MG tablet Take 1 tablet (4 mg total) by mouth every 6 (six) hours as needed for up to 15 days for moderate pain or severe pain. 09/25/21 10/10/21  [name], MD  ?mirtazapine (REMERON) 45 MG tablet Take 1 tablet (45 mg total) by mouth at bedtime. 04/20/21 04/20/22  Vevelyn Francois, NP  ?mometasone-formoterol (DULERA) 100-5 MCG/ACT AERO Inhale 2 puffs into the lungs daily as needed for wheezing or shortness of breath. 04/13/20 06/03/21  Vevelyn Francois, NP  ?morphine (MS CONTIN) 15 MG 12 hr tablet Take 1 tablet (15 mg total) by mouth every 12 (twelve) hours. Must last 30 days. 09/12/21 10/12/21  Vevelyn Francois, NP  ?Niacin (VITAMIN B-3 PO) Take 1 tablet by mouth daily.    [provider]  ?omeprazole (PRILOSEC) 20 MG capsule Take 1 capsule (20 mg total) by mouth 2 (two) times daily before a meal. 04/23/21 04/23/22  Vevelyn Francois, NP  ?polyethylene glycol (MIRALAX / GLYCOLAX) 17 g packet Take 17 g by mouth daily as needed for mild constipation.    [provider]  ?promethazine (PHENERGAN) 25 MG tablet Take 0.5-1 tablets (12.5-25 mg total) by mouth every 6 (six) hours as needed for nausea or vomiting. 10/25/20 10/25/21  Vevelyn Francois, NP  ?XARELTO 20 MG TABS tablet Take 1 tablet (20 mg total) by mouth daily with supper. 04/20/21 04/20/22  Vevelyn Francois, NP  ? ? ?Physical Exam: ? ?Vitals:  ? 10/04/21 1915 10/04/21 1930 10/04/21 1945 10/05/21 0010  ?BP:  101/75  100/78  ?Pulse: 62 65 66 76  ?Resp:  16  16  ?Temp:      ?TempSrc:      ?SpO2: 99% 98% 100% 96%  ? ? ?Constitutional: Awake alert and oriented x3, patient is in distress due to pain. ?Skin: no rashes, no lesions, poor skin turgor noted. ?Eyes: Pupils are equally reactive to light.  No evidence of scleral icterus or conjunctival pallor.  ?ENMT: Moist mucous membranes noted.  Posterior pharynx clear of any exudate or lesions.   ?Neck: normal, supple, no masses, no thyromegaly.   No evidence of jugular venous distension.   ?Respiratory: clear to auscultation bilaterally, no wheezing, no crackles. Normal respiratory effort. No accessory muscle use.  ?Cardiovascular: Regular rate and rhythm, no murmurs / rubs / gallops. No extremity edema. 2+ pedal pulses. No carotid bruits.  ?Chest:   Nontender without crepitus or deformity.   ?Back:   Nontender without crepitus or deformity. ?Abdomen: Abdomen is soft and nontender.  No evidence of intra-abdominal masses.  Positive bowel sounds noted in all quadrants.   ?Musculoskeletal: No joint deformity upper and lower extremities. Good ROM, no contractures. Normal muscle tone.  ?Neurologic: CN 2-12 grossly intact. Sensation intact.  Patient moving all 4 extremities spontaneously.  Patient is following all commands.  Patient is responsive to verbal stimuli.   ?Psychiatric: Patient exhibits normal mood with appropriate affect.  Patient seems to possess insight as to their current situation.   ? ?Data Reviewed: ? ?I have personally reviewed and interpreted labs, imaging. ? ?Significant findings are hemoglobin 6.7, white blood cell count 14.1, platelet count 855, Urinalysis unremarkable, potassium 3.3, COVID-19 PCR testing pending. ? ?Chest x-ray personally reviewed revealing no evidence of acute cardiopulmonary disease. ? ? ?Assessment and Plan: ?* Acute sickle cell crisis ([place]) ?Patient presenting with pain consistent with previous sickle cell pain crises ?No clinical evidence of acute chest syndrome ?Hemoglobin currently 6.7.  We will abstain from blood transfusion in the absence of acute chest syndrome or hypoxia. ?Initiating high-dose intravenous opiate-based analgesics via PCA pump ?Additionally providing patient with as needed Toradol ?Patient will be initiated on intravenous while resuscitation with half-normal saline ?Obtaining urinalysis, chest x-ray and COVID-19 testing to evaluate for etiology of the onset of this crisis ?Monitoring progression of  disease with serial CBCs, serial reticulocyte counts, serial bilirubins ? ? ?Mild intermittent asthma without complication ?No evidence of acute asthma exacerbation ?As needed bronchodilator therapy for sho

## 2021-10-04 NOTE — ED Provider Notes (Signed)
?[facility] DEPT ?Provider Note ? ? ?CSN: [id number] ?Arrival date & time: 10/04/21  1132 ? ?  ? ?History ? ?Chief Complaint  ?Patient presents with  ? Sickle Cell Pain Crisis  ? ? ?Molly Gutierrez is a 41 y.o. female. ? ?HPI ?41 year old female with a history of sickle cell, PE on Xarelto, asthma presents with back pain and left hip pain.  This is consistent with prior sickle cell pain crisis per her.  She does not always get pain in this area but has had it before and this does not seem abnormal to her for a sickle cell pain crisis.  No fevers, urinary symptoms, abdominal pain, incontinence or weakness/numbness.  She has been taking her home Dilaudid and morphine without relief.  She is due for a blood transfusion tomorrow.  No chest pain or shortness of breath. ? ?Home Medications ?Prior to Admission medications   ?Medication Sig Start Date End Date Taking? Authorizing Provider  ?albuterol (VENTOLIN HFA) 108 (90 Base) MCG/ACT inhaler Inhale 2 puffs into the lungs 2 (two) times daily as needed for wheezing or shortness of breath. 04/13/20 10/04/21 Yes Vevelyn Francois, NP  ?cholecalciferol (VITAMIN D3) 25 MCG (1000 UNIT) tablet Take 1,000 Units by mouth daily.   Yes [provider]  ?Cyanocobalamin (VITAMIN B-12 PO) Take 1 tablet by mouth daily.   Yes [provider]  ?Deferasirox 360 MG TABS Take 1,080 mg by mouth at bedtime. 04/23/21  Yes [provider]  ?diphenhydrAMINE (BENADRYL) 25 mg capsule Take 25 mg by mouth 3 (three) times daily as needed for itching.   Yes [provider]  ?fluticasone (FLONASE) 50 MCG/ACT nasal spray Place 1 spray into both nostrils daily as needed for allergies or rhinitis.   Yes [provider]  ?folic acid (FOLVITE) 1 MG tablet Take 1 mg by mouth daily.   Yes [provider]  ?HYDROmorphone (DILAUDID) 4 MG tablet Take 1 tablet (4 mg total) by mouth every 6 (six) hours as needed for up to 15 days  for moderate pain or severe pain. 09/25/21 10/10/21 Yes [name], MD  ?mirtazapine (REMERON) 45 MG tablet Take 1 tablet (45 mg total) by mouth at bedtime. 04/20/21 04/20/22 Yes Vevelyn Francois, NP  ?mometasone-formoterol (DULERA) 100-5 MCG/ACT AERO Inhale 2 puffs into the lungs daily as needed for wheezing or shortness of breath. 04/13/20 10/04/21 Yes Vevelyn Francois, NP  ?morphine (MS CONTIN) 15 MG 12 hr tablet Take 1 tablet (15 mg total) by mouth every 12 (twelve) hours. Must last 30 days. 09/12/21 10/12/21 Yes Vevelyn Francois, NP  ?omeprazole (PRILOSEC) 20 MG capsule Take 1 capsule (20 mg total) by mouth 2 (two) times daily before a meal. ?Patient taking differently: Take 20 mg by mouth daily as needed (heartburn). 04/23/21 04/23/22 Yes Vevelyn Francois, NP  ?polyethylene glycol (MIRALAX / GLYCOLAX) 17 g packet Take 17 g by mouth daily as needed for mild constipation.   Yes [provider]  ?promethazine (PHENERGAN) 25 MG tablet Take 0.5-1 tablets (12.5-25 mg total) by mouth every 6 (six) hours as needed for nausea or vomiting. 10/25/20 10/25/21 Yes Vevelyn Francois, NP  ?XARELTO 20 MG TABS tablet Take 1 tablet (20 mg total) by mouth daily with supper. 04/20/21 04/20/22 Yes Vevelyn Francois, NP  ?   ? ?Allergies    ?Cefaclor, Hydroxyurea, Omeprazole, Tomato, and Ketamine   ? ?Review of Systems   ?Review of Systems  ?Constitutional:  Negative for  fever.  ?Gastrointestinal:  Positive for nausea. Negative for abdominal pain.  ?Genitourinary:  Negative for dysuria.  ?Musculoskeletal:  Positive for arthralgias and back pain.  ?Neurological:  Negative for weakness and numbness.  ? ?Physical Exam ?Updated Vital Signs ?BP 101/75   Pulse 66   Temp 98.8 ?F (37.1 ?C) (Oral)   Resp 16   SpO2 100%  ?Physical Exam ?Vitals and nursing note reviewed.  ?Constitutional:   ?   Appearance: She is well-developed.  ?HENT:  ?   Head: Normocephalic and atraumatic.  ?Cardiovascular:  ?   Rate and Rhythm: Normal rate and regular  rhythm.  ?   Heart sounds: Normal heart sounds.  ?Pulmonary:  ?   Effort: Pulmonary effort is normal.  ?   Breath sounds: Normal breath sounds.  ?Abdominal:  ?   General: There is no distension.  ?   Palpations: Abdomen is soft.  ?   Tenderness: There is no abdominal tenderness.  ?Musculoskeletal:  ?   Lumbar back: Tenderness present.  ?   Left hip: Tenderness present. Normal range of motion.  ?   Comments: Diffuse bilateral lumbar tenderness  ?Skin: ?   General: Skin is warm and dry.  ?Neurological:  ?   Mental Status: She is alert.  ?   Comments: Equal strength in bilateral lower extremities, though limited by pain that this induces.  Grossly normal sensation.  ? ? ?ED Results / Procedures / Treatments   ?Labs ?(all labs ordered are listed, but only abnormal results are displayed) ?Labs Reviewed  ?COMPREHENSIVE METABOLIC PANEL - Abnormal; Notable for the following components:  ?    Result Value  ? Potassium 3.3 (*)   ? Glucose, Bld 103 (*)   ? Calcium 8.8 (*)   ? Total Bilirubin 2.7 (*)   ? All other components within normal limits  ?CBC WITH DIFFERENTIAL/PLATELET - Abnormal; Notable for the following components:  ? WBC 14.1 (*)   ? RBC 2.46 (*)   ? Hemoglobin 6.7 (*)   ? HCT 20.0 (*)   ? RDW 22.7 (*)   ? Platelets 855 (*)   ? nRBC 1.4 (*)   ? Neutro Abs 9.8 (*)   ? Monocytes Absolute 1.9 (*)   ? Abs Immature Granulocytes 0.09 (*)   ? All other components within normal limits  ?RETICULOCYTES - Abnormal; Notable for the following components:  ? Retic Ct Pct 9.1 (*)   ? RBC. 2.42 (*)   ? Retic Count, Absolute 219.7 (*)   ? Immature Retic Fract 38.8 (*)   ? All other components within normal limits  ?URINALYSIS, ROUTINE W REFLEX MICROSCOPIC - Abnormal; Notable for the following components:  ? Hgb urine dipstick SMALL (*)   ? Leukocytes,Ua TRACE (*)   ? All other components within normal limits  ?RESP PANEL BY RT-PCR (FLU A&B, COVID) ARPGX2  ?COMPREHENSIVE METABOLIC PANEL  ?MAGNESIUM  ?CBC WITH DIFFERENTIAL/PLATELET   ?RETICULOCYTES  ?I-STAT BETA HCG BLOOD, ED ([facility], [facility], [facility] ONLY)  ? ? ?EKG ?None ? ?Radiology ?DG Chest Portable 1 View ? ?Result Date: 10/04/2021 ?CLINICAL DATA:  Sickle cell crisis. EXAM: PORTABLE CHEST 1 VIEW COMPARISON:  June 02, 2021 FINDINGS: Stable bilateral venous Port-A-Cath positioning is noted. The heart size and mediastinal contours are within normal limits. Both lungs are clear. Radiopaque surgical clips are seen within the right upper quadrant. The visualized skeletal structures are unremarkable. IMPRESSION: No active cardiopulmonary disease. Electronically Signed   By: Joyce Gross.D.  On: 10/04/2021 20:07   ? ?Procedures ?Procedures  ? ? ?Medications Ordered in ED ?Medications  ?dextrose 5 %-0.45 % sodium chloride infusion ( Intravenous New Bag/Given 10/04/21 1627)  ?albuterol (PROVENTIL) (2.5 MG/3ML) 0.083% nebulizer solution 3 mL (has no administration in time range)  ?Deferasirox TABS 1,440 mg (has no administration in time range)  ?fluticasone (FLONASE) 50 MCG/ACT nasal spray 1 spray (has no administration in time range)  ?folic acid (FOLVITE) tablet 1 mg (has no administration in time range)  ?mirtazapine (REMERON) tablet 45 mg (has no administration in time range)  ?morphine (MS CONTIN) 12 hr tablet 15 mg (has no administration in time range)  ?pantoprazole (PROTONIX) EC tablet 40 mg (has no administration in time range)  ?rivaroxaban (XARELTO) tablet 20 mg (has no administration in time range)  ?acetaminophen (TYLENOL) tablet 650 mg (has no administration in time range)  ?  Or  ?acetaminophen (TYLENOL) suppository 650 mg (has no administration in time range)  ?polyethylene glycol (MIRALAX / GLYCOLAX) packet 17 g (has no administration in time range)  ?naloxone (NARCAN) injection 0.4 mg (has no administration in time range)  ?  And  ?sodium chloride flush (NS) 0.9 % injection 9 mL (has no administration in time range)  ?ondansetron (ZOFRAN) injection 4 mg (has no administration in time  range)  ?diphenhydrAMINE (BENADRYL) capsule 25 mg (has no administration in time range)  ?HYDROmorphone (DILAUDID) 1 mg/mL PCA injection (has no administration in time range)  ?HYDROmorphone (DILAUDID) inject

## 2021-10-04 NOTE — ED Provider Triage Note (Signed)
Emergency Medicine Provider Triage Evaluation Note ? ?[facility] , a 41 y.o. female  was evaluated in triage.  Pt complains of pain to her back and hip.  States it is consistent with prior sickle cell pain exacerbations.  Reports associated nausea.  Denies vomiting, chest pain, shortness of breath, abdominal pain, fevers, chills. ? ?Physical Exam  ?BP 111/78 (BP Location: Left Arm)   Pulse 78   Temp 98.8 ?F (37.1 ?C) (Oral)   Resp 18   SpO2 99%  ?Gen:   Awake, no distress   ?Resp:  Normal effort  ?MSK:   Moves extremities without difficulty  ?Other:   ? ?Medical Decision Making  ?Medically screening exam initiated at 3:23 PM.  Appropriate orders placed.  [facility] was informed that the remainder of the evaluation will be completed by another provider, this initial triage assessment does not replace that evaluation, and the importance of remaining in the ED until their evaluation is complete. ?  ?Rayna Sexton, PA-C ?10/04/21 1524 ? ?

## 2021-10-04 NOTE — Assessment & Plan Note (Signed)
?   No evidence of acute asthma exacerbation ?? As needed bronchodilator therapy for shortness of breath and wheezing. ? ?

## 2021-10-04 NOTE — Assessment & Plan Note (Signed)
Continuing home regimen of daily PPI therapy.  

## 2021-10-05 LAB — CBC WITH DIFFERENTIAL/PLATELET
Abs Immature Granulocytes: 0.1 10*3/uL — ABNORMAL HIGH (ref 0.00–0.07)
Basophils Absolute: 0 10*3/uL (ref 0.0–0.1)
Basophils Relative: 0 %
Eosinophils Absolute: 0.3 10*3/uL (ref 0.0–0.5)
Eosinophils Relative: 2 %
HCT: 19 % — ABNORMAL LOW (ref 36.0–46.0)
Hemoglobin: 6.4 g/dL — CL (ref 12.0–15.0)
Immature Granulocytes: 1 %
Lymphocytes Relative: 23 %
Lymphs Abs: 3.2 10*3/uL (ref 0.7–4.0)
MCH: 27.7 pg (ref 26.0–34.0)
MCHC: 33.7 g/dL (ref 30.0–36.0)
MCV: 82.3 fL (ref 80.0–100.0)
Monocytes Absolute: 1.9 10*3/uL — ABNORMAL HIGH (ref 0.1–1.0)
Monocytes Relative: 14 %
Neutro Abs: 8.3 10*3/uL — ABNORMAL HIGH (ref 1.7–7.7)
Neutrophils Relative %: 60 %
Platelets: 736 10*3/uL — ABNORMAL HIGH (ref 150–400)
RBC: 2.31 MIL/uL — ABNORMAL LOW (ref 3.87–5.11)
RDW: 22.7 % — ABNORMAL HIGH (ref 11.5–15.5)
WBC: 13.9 10*3/uL — ABNORMAL HIGH (ref 4.0–10.5)
nRBC: 1.2 % — ABNORMAL HIGH (ref 0.0–0.2)

## 2021-10-05 LAB — COMPREHENSIVE METABOLIC PANEL
ALT: 10 U/L (ref 0–44)
AST: 21 U/L (ref 15–41)
Albumin: 3.7 g/dL (ref 3.5–5.0)
Alkaline Phosphatase: 55 U/L (ref 38–126)
Anion gap: 5 (ref 5–15)
BUN: 8 mg/dL (ref 6–20)
CO2: 26 mmol/L (ref 22–32)
Calcium: 8.8 mg/dL — ABNORMAL LOW (ref 8.9–10.3)
Chloride: 107 mmol/L (ref 98–111)
Creatinine, Ser: 0.72 mg/dL (ref 0.44–1.00)
GFR, Estimated: >60 mL/min (ref >60)
Glucose, Bld: 98 mg/dL (ref 70–99)
Potassium: 3.8 mmol/L (ref 3.5–5.1)
Sodium: 138 mmol/L (ref 135–145)
Total Bilirubin: 2.2 mg/dL — ABNORMAL HIGH (ref 0.3–1.2)
Total Protein: 7.3 g/dL (ref 6.5–8.1)

## 2021-10-05 LAB — RETICULOCYTES
Immature Retic Fract: 36.9 % — ABNORMAL HIGH (ref 2.3–15.9)
RBC.: 2.3 MIL/uL — ABNORMAL LOW (ref 3.87–5.11)
Retic Count, Absolute: 207 10*3/uL — ABNORMAL HIGH (ref 19.0–186.0)
Retic Ct Pct: 8.6 % — ABNORMAL HIGH (ref 0.4–3.1)

## 2021-10-05 LAB — RESP PANEL BY RT-PCR (FLU A&B, COVID) ARPGX2
Influenza A by PCR: NEGATIVE
Influenza B by PCR: NEGATIVE
SARS Coronavirus 2 by RT PCR: NEGATIVE

## 2021-10-05 LAB — TYPE AND SCREEN
ABO/RH(D): A POS
Antibody Screen: NEGATIVE

## 2021-10-05 LAB — MAGNESIUM: Magnesium: 2.1 mg/dL (ref 1.7–2.4)

## 2021-10-05 MED ORDER — HYDROMORPHONE HCL 2 MG/ML IJ SOLN
2.0000 mg | INTRAMUSCULAR | Status: AC | PRN
  Administered 2021-10-05 (×2): 2 mg via INTRAVENOUS
  Filled 2021-10-05 (×2): qty 1

## 2021-10-05 MED ORDER — DIPHENHYDRAMINE HCL 50 MG/ML IJ SOLN
25.0000 mg | Freq: Once | INTRAMUSCULAR | Status: AC
  Administered 2021-10-05: 25 mg via INTRAVENOUS
  Filled 2021-10-05: qty 1

## 2021-10-05 MED ORDER — DIPHENHYDRAMINE HCL 50 MG/ML IJ SOLN
12.5000 mg | Freq: Four times a day (QID) | INTRAMUSCULAR | Status: DC | PRN
  Administered 2021-10-05 – 2021-10-08 (×12): 12.5 mg via INTRAVENOUS
  Filled 2021-10-05 (×12): qty 1

## 2021-10-05 NOTE — ED Notes (Signed)
66/41 not an accurate blood pressure ? ?

## 2021-10-06 DIAGNOSIS — D5700 Hb-SS disease with crisis, unspecified: Secondary | ICD-10-CM | POA: Diagnosis not present

## 2021-10-06 LAB — CBC WITH DIFFERENTIAL/PLATELET
Abs Immature Granulocytes: 0.23 10*3/uL — ABNORMAL HIGH (ref 0.00–0.07)
Basophils Absolute: 0.1 10*3/uL (ref 0.0–0.1)
Basophils Relative: 0 %
Eosinophils Absolute: 0.4 10*3/uL (ref 0.0–0.5)
Eosinophils Relative: 3 %
HCT: 18.9 % — ABNORMAL LOW (ref 36.0–46.0)
Hemoglobin: 6.5 g/dL — CL (ref 12.0–15.0)
Immature Granulocytes: 2 %
Lymphocytes Relative: 25 %
Lymphs Abs: 3.8 10*3/uL (ref 0.7–4.0)
MCH: 27.9 pg (ref 26.0–34.0)
MCHC: 34.4 g/dL (ref 30.0–36.0)
MCV: 81.1 fL (ref 80.0–100.0)
Monocytes Absolute: 1.9 10*3/uL — ABNORMAL HIGH (ref 0.1–1.0)
Monocytes Relative: 13 %
Neutro Abs: 8.9 10*3/uL — ABNORMAL HIGH (ref 1.7–7.7)
Neutrophils Relative %: 57 %
Platelets: 711 10*3/uL — ABNORMAL HIGH (ref 150–400)
RBC: 2.33 MIL/uL — ABNORMAL LOW (ref 3.87–5.11)
RDW: 24 % — ABNORMAL HIGH (ref 11.5–15.5)
WBC: 15.3 10*3/uL — ABNORMAL HIGH (ref 4.0–10.5)
nRBC: 1.4 % — ABNORMAL HIGH (ref 0.0–0.2)

## 2021-10-06 LAB — COMPREHENSIVE METABOLIC PANEL
ALT: 9 U/L (ref 0–44)
AST: 21 U/L (ref 15–41)
Albumin: 3.4 g/dL — ABNORMAL LOW (ref 3.5–5.0)
Alkaline Phosphatase: 52 U/L (ref 38–126)
Anion gap: 5 (ref 5–15)
BUN: 6 mg/dL (ref 6–20)
CO2: 23 mmol/L (ref 22–32)
Calcium: 8.2 mg/dL — ABNORMAL LOW (ref 8.9–10.3)
Chloride: 106 mmol/L (ref 98–111)
Creatinine, Ser: 0.75 mg/dL (ref 0.44–1.00)
GFR, Estimated: >60 mL/min (ref >60)
Glucose, Bld: 100 mg/dL — ABNORMAL HIGH (ref 70–99)
Potassium: 4.1 mmol/L (ref 3.5–5.1)
Sodium: 134 mmol/L — ABNORMAL LOW (ref 135–145)
Total Bilirubin: 1.7 mg/dL — ABNORMAL HIGH (ref 0.3–1.2)
Total Protein: 6.7 g/dL (ref 6.5–8.1)

## 2021-10-06 NOTE — Progress Notes (Signed)
SICKLE CELL SERVICE ?PROGRESS NOTE ? ?[place] QXI:[id number] DOB: 05-03-1981 DOA: 10/04/2021 ?PCP: Vevelyn Francois, NP ? ?Assessment/Plan: ?Principal Problem: ?  Acute sickle cell crisis ([place]) ?Active Problems: ?  Mild intermittent asthma without complication ?  Hx of pulmonary embolism ([place]) ?  Gastro-esophageal reflux disease without esophagitis ? ?Acute sickle cell crisis: Patient's pain is improving but still high.  Currently at 8 out of 10.  Mainly in her thighs.  No fever or chills.  Currently on Dilaudid PCA Toradol and her MS Contin.  We will continue. ?Anemia of chronic disease: Monitor H&H.  Appears stable. ?Chronic anticoagulation: Secondary to pulmonary embolism.  Continue Xarelto ?GERD: Continue PPIs with Protonix ?History of asthma: Stable.  As needed breathing treatments ?History of iron overload: Continue iron binders ? ?Code Status: Full code ?Family Communication: No family at bedside ?Disposition Plan: Home ? ?Calyn Rubi,LAWAL  ?Pager [phone]. If 7PM-7AM, please contact night-coverage. ? ?10/06/2021, 5:08 PM  LOS: 2 days  ? ?Brief narrative: ?41 year old female with past medical history of sickle cell anemia, mild intermittent asthma, gastroesophageal reflux disease and history of pulmonary embolism (04/2019) currently on Xarelto who presents to [facility] emergency department with complaints of back pain. ?  ?Patient explains that approximately 2 days ago she began to develop low back pain as well as associated left hip pain.  Pain is severe in intensity, sharp in quality without any alleviating or exacerbating factors.  Patient states that this pain is consistent with her previous sickle cell crises.  Patient has been attempting to take her home regimen of opiate-based analgesics without any improvement in symptoms.  Patient denies any associated fevers, sick contacts, recent travel. ?  ?Patient's symptoms continue to worsen over the following 2 days until she eventually  presented to [facility] emergency department for evaluation. ?  ?Upon evaluation in the emergency department urinalysis and chest x-ray were unremarkable.  Hemoglobin was noted to have significantly dropped to 6.7, down from 8.0 in December 2022 however patient denied any chest pain or shortness of breath and exhibited no hypoxia.  Patient was given several doses of intravenous Dilaudid as well as being initiated on intravenous fluids and the hospitalist group was then called to assess the patient for admission to the hospital. ? ?Consultants: ?None ? ?Procedures: ?Chest x-ray ? ?Antibiotics: ?None ? ?HPI/Subjective: ?Patient still complaining of 7 out of 10 pain in back and her legs.  No fever or chills no nausea vomiting or diarrhea ? ?Objective: ?Vitals:  ? 10/06/21 0934 10/06/21 1200 10/06/21 1336 10/06/21 1600  ?BP:   94/68   ?Pulse:   87   ?Resp: '10 10 16 10  '$ ?Temp:   99 ?F (37.2 ?C)   ?TempSrc:   Oral   ?SpO2: 95% 93% 94% 93%  ?Weight:      ?Height:      ? ?Weight change:  ? ?Intake/Output Summary (Last 24 hours) at 10/06/2021 1708 ?Last data filed at 10/06/2021 1600 ?Gross per 24 hour  ?Intake 2858.39 ml  ?Output --  ?Net 2858.39 ml  ? ? ?General: Alert, awake, oriented x3, in no acute distress.  ?HEENT: [place]/AT PEERL, EOMI ?Neck: Trachea midline,  no masses, no thyromegal,y no JVD, no carotid bruit ?OROPHARYNX:  Moist, No exudate/ erythema/lesions.  ?Heart: Regular rate and rhythm, without murmurs, rubs, gallops, PMI non-displaced, no heaves or thrills on palpation.  ?Lungs: Clear to auscultation, no wheezing or rhonchi noted. No increased vocal fremitus resonant to percussion  ?Abdomen: Soft,  nontender, nondistended, positive bowel sounds, no masses no hepatosplenomegaly noted.Marland Kitchen  ?Neuro: No focal neurological deficits noted cranial nerves II through XII grossly intact. DTRs 2+ bilaterally upper and lower extremities. Strength 5 out of 5 in bilateral upper and lower extremities. ?Musculoskeletal: No  warm swelling or erythema around joints, no spinal tenderness noted. ?Psychiatric: Patient alert and oriented x3, good insight and cognition, good recent to remote recall. ?Lymph node survey: No cervical axillary or inguinal lymphadenopathy noted. ? ? ?Data Reviewed: ?Basic Metabolic Panel: ?Recent Labs  ?Lab 10/04/21 ?1612 10/05/21 ?3716 10/06/21 ?0818  ?NA 139 138 134*  ?K 3.3* 3.8 4.1  ?CL 108 107 106  ?CO2 '25 26 23  '$ ?GLUCOSE 103* 98 100*  ?BUN '6 8 6  '$ ?CREATININE 0.57 0.72 0.75  ?CALCIUM 8.8* 8.8* 8.2*  ?MG  --  2.1  --   ? ?Liver Function Tests: ?Recent Labs  ?Lab 10/04/21 ?1612 10/05/21 ?9678 10/06/21 ?0818  ?AST '21 21 21  '$ ?ALT '10 10 9  '$ ?ALKPHOS 59 55 52  ?BILITOT 2.7* 2.2* 1.7*  ?PROT 8.0 7.3 6.7  ?ALBUMIN 4.0 3.7 3.4*  ? ?No results for input(s): LIPASE, AMYLASE in the last 168 hours. ?No results for input(s): AMMONIA in the last 168 hours. ?CBC: ?Recent Labs  ?Lab 10/04/21 ?1612 10/05/21 ?9381 10/06/21 ?0818  ?WBC 14.1* 13.9* 15.3*  ?NEUTROABS 9.8* 8.3* 8.9*  ?HGB 6.7* 6.4* 6.5*  ?HCT 20.0* 19.0* 18.9*  ?MCV 81.3 82.3 81.1  ?PLT 855* 736* 711*  ? ?Cardiac Enzymes: ?No results for input(s): CKTOTAL, CKMB, CKMBINDEX, TROPONINI in the last 168 hours. ?BNP (last 3 results) ?No results for input(s): BNP in the last 8760 hours. ? ?ProBNP (last 3 results) ?No results for input(s): PROBNP in the last 8760 hours. ? ?CBG: ?No results for input(s): GLUCAP in the last 168 hours. ? ?Recent Results (from the past 240 hour(s))  ?Resp Panel by RT-PCR (Flu A&B, Covid) Nasopharyngeal Swab     Status: None  ? Collection Time: 10/05/21  5:55 AM  ? Specimen: Nasopharyngeal Swab; Nasopharyngeal(NP) swabs in vial transport medium  ?Result Value Ref Range Status  ? SARS Coronavirus 2 by RT PCR NEGATIVE NEGATIVE Final  ?  Comment: (NOTE) ?SARS-CoV-2 target nucleic acids are NOT DETECTED. ? ?The SARS-CoV-2 RNA is generally detectable in upper respiratory ?specimens during the acute phase of infection. The lowest ?concentration of  SARS-CoV-2 viral copies this assay can detect is ?138 copies/mL. A negative result does not preclude SARS-Cov-2 ?infection and should not be used as the sole basis for treatment or ?other patient management decisions. A negative result may occur with  ?improper specimen collection/handling, submission of specimen other ?than nasopharyngeal swab, presence of viral mutation(s) within the ?areas targeted by this assay, and inadequate number of viral ?copies(<138 copies/mL). A negative result must be combined with ?clinical observations, patient history, and epidemiological ?information. The expected result is Negative. ? ?Fact Sheet for Patients:  ?EntrepreneurPulse.com.au ? ?Fact Sheet for Healthcare Providers:  ?IncredibleEmployment.be ? ?This test is no t yet approved or cleared by the [place] FDA and  ?has been authorized for detection and/or diagnosis of SARS-CoV-2 by ?FDA under an Emergency Use Authorization (EUA). This EUA will remain  ?in effect (meaning this test can be used) for the duration of the ?COVID-19 declaration under Section 564(b)(1) of the Act, 21 ?U.S.C.section 360bbb-3(b)(1), unless the authorization is terminated  ?or revoked sooner.  ? ? ?  ? Influenza A by PCR NEGATIVE NEGATIVE Final  ? Influenza B by  PCR NEGATIVE NEGATIVE Final  ?  Comment: (NOTE) ?The Xpert Xpress SARS-CoV-2/FLU/RSV plus assay is intended as an aid ?in the diagnosis of influenza from Nasopharyngeal swab specimens and ?should not be used as a sole basis for treatment. Nasal washings and ?aspirates are unacceptable for Xpert Xpress SARS-CoV-2/FLU/RSV ?testing. ? ?Fact Sheet for Patients: ?EntrepreneurPulse.com.au ? ?Fact Sheet for Healthcare Providers: ?IncredibleEmployment.be ? ?This test is not yet approved or cleared by the [place] FDA and ?has been authorized for detection and/or diagnosis of SARS-CoV-2 by ?FDA under an Emergency Use  Authorization (EUA). This EUA will remain ?in effect (meaning this test can be used) for the duration of the ?COVID-19 declaration under Section 564(b)(1) of the Act, 21 U.S.C. ?section 360bbb-3(b)(1), unless the aut

## 2021-10-07 DIAGNOSIS — D5700 Hb-SS disease with crisis, unspecified: Secondary | ICD-10-CM | POA: Diagnosis not present

## 2021-10-07 NOTE — TOC Initial Note (Signed)
Transition of Care (TOC) - Initial/Assessment Note  ? ? ?Patient Details  ?Name: [facility] ?MRN: [id number] ?Date of Birth: 1981/06/03 ? ?Transition of Care (TOC) CM/SW Contact:    ?Tawanna Cooler, RN ?Phone Number: ?10/07/2021, 2:43 PM ? ?Clinical Narrative:                 ? ?Transition of Care Department [facility]) has reviewed patient and no TOC needs have been identified at this time. We will continue to monitor patient advancement through interdisciplinary progression rounds. If new patient transition needs arise, please place a TOC consult. ? ? ? ?Expected Discharge Plan: Home/Self Care ?Barriers to Discharge: Continued Medical Work up ? ? ?Expected Discharge Plan and Services ?Expected Discharge Plan: Home/Self Care ?   ?  ?Living arrangements for the past 2 months: Apartment ?                ?  ? ?Prior Living Arrangements/Services ?Living arrangements for the past 2 months: Apartment ?Lives with:: Self ?Patient language and need for interpreter reviewed:: Yes ?       ?Need for Family Participation in Patient Care: No (Comment) ?Care giver support system in place?: No (comment) ?  ?Criminal Activity/Legal Involvement Pertinent to Current Situation/Hospitalization: No - Comment as needed ? ?Activities of Daily Living ?Home Assistive Devices/Equipment: None ?ADL Screening (condition at time of admission) ?Patient's cognitive ability adequate to safely complete daily activities?: Yes ?Is the patient deaf or have difficulty hearing?: No ?Does the patient have difficulty seeing, even when wearing glasses/contacts?: No ?Does the patient have difficulty concentrating, remembering, or making decisions?: No ?Patient able to express need for assistance with ADLs?: Yes ?Does the patient have difficulty dressing or bathing?: No ?Independently performs ADLs?: Yes (appropriate for developmental age) ?Does the patient have difficulty walking or climbing stairs?: No ?Weakness of Legs: None ?Weakness of Arms/Hands:  None ? ? ?Emotional Assessment ? ?Orientation: : Oriented to Self, Oriented to Place, Oriented to  Time, Oriented to Situation ?Alcohol / Substance Use: Not Applicable ?Psych Involvement: No (comment) ? ?Admission diagnosis:  Sickle cell pain crisis ([place]) [D57.00] ?Acute sickle cell crisis ([place]) [D57.00] ?Patient Active Problem List  ? Diagnosis Date Noted  ? Acute sickle cell crisis ([place]) 10/04/2021  ? Atypical chest pain 06/03/2021  ? Upper respiratory infection, acute 04/25/2021  ? Chronic pain syndrome 11/22/2020  ? Sickle cell disease with crisis ([place]) 10/05/2020  ? 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 Sickle cell anemia with pain ([place]) 04/24/2020  ? Mild intermittent asthma without complication 02/54/2706  ? Sickle cell anemia with crisis ([place]) 01/07/2020  ? Sickle cell crisis acute chest syndrome ([place]) 07/29/2019  ? Drug-seeking behavior 07/07/2019  ? Sinus tachycardia 07/07/2019  ? Therapeutic opioid-induced constipation (OIC) 07/07/2019  ? Breast mass in female 06/29/2019  ? Atelectasis of right lung 06/29/2019  ? Sickle cell crisis ([place]) 06/27/2019  ? Sickle cell pain crisis ([place]) 04/19/2019  ? Pneumonia 03/27/2019  ? 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 Anterior chest wall pain 11/13/2018  ? Epigastric abdominal pain 11/13/2018  ? Hematuria 11/13/2018  ? Hyperbilirubinemia 11/12/2018  ? Long term current use of anticoagulant therapy 11/12/2018  ? History of pulmonary embolism 09/26/2018  ? Hx of pulmonary embolism ([place]) 09/26/2018  ? Opioid dependence ([place]) 09/13/2018  ? Port-A-Cath in place 09/13/2018  ? Narcotic abuse, continuous ([place]) 08/28/2018  ? Anemia 07/03/2018  ? Personal history of other venous thrombosis and embolism 07/03/2018  ? History of transfusion 07/03/2018  ? Gastro-esophageal reflux  disease without esophagitis 06/02/2018  ? Asthma 05/19/2018  ? Anxiety and depression 10/30/2017  ? At risk for sepsis 06/13/2017  ? Mitral regurgitation 02/01/2014  ? Itching 12/13/2013  ? Nausea &  vomiting 12/13/2013  ? 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  ? Frequent complaints of pain 11/01/2013  ? Hypokalemia 09/19/2013  ? S/P total hip arthroplasty 05/19/2013  ? 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 Lower urinary tract infectious disease 03/02/2013  ? Chest pain 11/14/2012  ? 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 Pain management 08/01/2012  ? Reticulocytosis 07/31/2012  ? 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 Leukocytosis 03/27/2012  ? History of gestational diabetes 10/07/2011  ? Hb-SS disease with crisis, unspecified ([place]) 06/25/2010  ? ?PCP:  Vevelyn Francois, NP ?Pharmacy:   ?[id number] - [place], [address] ?[place] ?[place] [address] ?Phone: [phone] Fax: [phone] ? ? ? ? ?Readmission Risk Interventions ? ?  10/07/2021  ?  2:43 PM 11/08/2020  ? 11:02 AM 07/02/2019  ? 12:39 PM  ?Readmission Risk Prevention Plan  ?Transportation Screening Complete  Complete  ?Medication Review Press photographer) Complete  Complete  ?PCP or Specialist appointment within 3-5 days of discharge Complete  Complete  ?[place] or Home Care Consult   Patient refused  ?SW Recovery Care/Counseling Consult Complete  Patient refused  ?Palliative Care Screening Not Applicable  Not Applicable  ?[place] Not Applicable  Not Applicable  ?  ? Information is confidential and restricted. Go to Review Flowsheets to unlock data.  ? ? ? ?

## 2021-10-07 NOTE — Progress Notes (Signed)
SICKLE CELL SERVICE ?PROGRESS NOTE ? ?[place] VOZ:[id number] DOB: May 12, 1981 DOA: 10/04/2021 ?PCP: Vevelyn Francois, NP ? ?Assessment/Plan: ?Principal Problem: ?  Acute sickle cell crisis ([place]) ?Active Problems: ?  Mild intermittent asthma without complication ?  Hx of pulmonary embolism ([place]) ?  Gastro-esophageal reflux disease without esophagitis ? ?Acute sickle cell crisis: Patient's pain is improving but still high.  Currently at 7 out of 10.  Mainly in her thighs and back.  No fever or chills.  Currently on Dilaudid PCA Toradol and her MS Contin.  We will continue. ?Anemia of chronic disease: Monitor H&H.  Appears stable. ?Chronic anticoagulation: Secondary to pulmonary embolism.  Continue Xarelto ?GERD: Continue PPIs with Protonix ?History of asthma: Stable.  As needed breathing treatments ?History of iron overload: Continue iron binders ? ?Code Status: Full code ?Family Communication: No family at bedside ?Disposition Plan: Home ? ?Lorayne Getchell,LAWAL  ?Pager [phone]. If 7PM-7AM, please contact night-coverage. ? ?10/07/2021, 7:27 PM  LOS: 3 days  ? ?Brief narrative: ?41 year old female with past medical history of sickle cell anemia, mild intermittent asthma, gastroesophageal reflux disease and history of pulmonary embolism (04/2019) currently on Xarelto who presents to [facility] emergency department with complaints of back pain. ?  ?Patient explains that approximately 2 days ago she began to develop low back pain as well as associated left hip pain.  Pain is severe in intensity, sharp in quality without any alleviating or exacerbating factors.  Patient states that this pain is consistent with her previous sickle cell crises.  Patient has been attempting to take her home regimen of opiate-based analgesics without any improvement in symptoms.  Patient denies any associated fevers, sick contacts, recent travel. ?  ?Patient's symptoms continue to worsen over the following 2 days until she  [facility]ually [facility] department for evaluation. ?  ?Upon evaluation in the emergency department urinalysis and chest x-ray were unremarkable.  Hemoglobin was noted to have significantly dropped to 6.7, down from 8.0 in December 2022 however patient denied any chest pain or shortness of breath and exhibited no hypoxia.  Patient was given several doses of intravenous Dilaudid as well as being initiated on intravenous fluids and the hospitalist group was then called to assess the patient for admission to the hospital. ? ?Consultants: ?None ? ?Procedures: ?Chest x-ray ? ?Antibiotics: ?None ? ?HPI/Subjective: ?Patient still complaining of 7 out of 10 pain in back and her legs.  No fever or chills no nausea vomiting or diarrhea.  Patient was able to get out of bed and walk around the room. ? ?Objective: ?Vitals:  ? 10/07/21 1146 10/07/21 1335 10/07/21 1657 10/07/21 1751  ?BP:  99/64  100/66  ?Pulse:  83  81  ?Resp: '14 15 14 14  '$ ?Temp:  98.4 ?F (36.9 ?C)  98.3 ?F (36.8 ?C)  ?TempSrc:  Oral  Oral  ?SpO2: 93% 94% 92% 96%  ?Weight:      ?Height:      ? ?Weight change:  ? ?Intake/Output Summary (Last 24 hours) at 10/07/2021 1927 ?Last data filed at 10/07/2021 1336 ?Gross per 24 hour  ?Intake 235 ml  ?Output 1100 ml  ?Net -865 ml  ? ? ? ?General: Alert, awake, oriented x3, in no acute distress.  ?HEENT: [place]/AT PEERL, EOMI ?Neck: Trachea midline,  no masses, no thyromegal,y no JVD, no carotid bruit ?OROPHARYNX:  Moist, No exudate/ erythema/lesions.  ?Heart: Regular rate and rhythm, without murmurs, rubs, gallops, PMI non-displaced, no heaves or thrills on  palpation.  ?Lungs: Clear to auscultation, no wheezing or rhonchi noted. No increased vocal fremitus resonant to percussion  ?Abdomen: Soft, nontender, nondistended, positive bowel sounds, no masses no hepatosplenomegaly noted.Marland Kitchen  ?Neuro: No focal neurological deficits noted cranial nerves II through XII grossly intact. DTRs 2+ bilaterally upper  and lower extremities. Strength 5 out of 5 in bilateral upper and lower extremities. ?Musculoskeletal: No warm swelling or erythema around joints, no spinal tenderness noted. ?Psychiatric: Patient alert and oriented x3, good insight and cognition, good recent to remote recall. ?Lymph node survey: No cervical axillary or inguinal lymphadenopathy noted. ? ? ?Data Reviewed: ?Basic Metabolic Panel: ?Recent Labs  ?Lab 10/04/21 ?1612 10/05/21 ?1638 10/06/21 ?0818  ?NA 139 138 134*  ?K 3.3* 3.8 4.1  ?CL 108 107 106  ?CO2 '25 26 23  '$ ?GLUCOSE 103* 98 100*  ?BUN '6 8 6  '$ ?CREATININE 0.57 0.72 0.75  ?CALCIUM 8.8* 8.8* 8.2*  ?MG  --  2.1  --   ? ? ?Liver Function Tests: ?Recent Labs  ?Lab 10/04/21 ?1612 10/05/21 ?4665 10/06/21 ?0818  ?AST '21 21 21  '$ ?ALT '10 10 9  '$ ?ALKPHOS 59 55 52  ?BILITOT 2.7* 2.2* 1.7*  ?PROT 8.0 7.3 6.7  ?ALBUMIN 4.0 3.7 3.4*  ? ? ?No results for input(s): LIPASE, AMYLASE in the last 168 hours. ?No results for input(s): AMMONIA in the last 168 hours. ?CBC: ?Recent Labs  ?Lab 10/04/21 ?1612 10/05/21 ?9935 10/06/21 ?0818  ?WBC 14.1* 13.9* 15.3*  ?NEUTROABS 9.8* 8.3* 8.9*  ?HGB 6.7* 6.4* 6.5*  ?HCT 20.0* 19.0* 18.9*  ?MCV 81.3 82.3 81.1  ?PLT 855* 736* 711*  ? ? ?Cardiac Enzymes: ?No results for input(s): CKTOTAL, CKMB, CKMBINDEX, TROPONINI in the last 168 hours. ?BNP (last 3 results) ?No results for input(s): BNP in the last 8760 hours. ? ?ProBNP (last 3 results) ?No results for input(s): PROBNP in the last 8760 hours. ? ?CBG: ?No results for input(s): GLUCAP in the last 168 hours. ? ?Recent Results (from the past 240 hour(s))  ?Resp Panel by RT-PCR (Flu A&B, Covid) Nasopharyngeal Swab     Status: None  ? Collection Time: 10/05/21  5:55 AM  ? Specimen: Nasopharyngeal Swab; Nasopharyngeal(NP) swabs in vial transport medium  ?Result Value Ref Range Status  ? SARS Coronavirus 2 by RT PCR NEGATIVE NEGATIVE Final  ?  Comment: (NOTE) ?SARS-CoV-2 target nucleic acids are NOT DETECTED. ? ?The SARS-CoV-2 RNA is generally  detectable in upper respiratory ?specimens during the acute phase of infection. The lowest ?concentration of SARS-CoV-2 viral copies this assay can detect is ?138 copies/mL. A negative result does not preclude SARS-Cov-2 ?infection and should not be used as the sole basis for treatment or ?other patient management decisions. A negative result may occur with  ?improper specimen collection/handling, submission of specimen other ?than nasopharyngeal swab, presence of viral mutation(s) within the ?areas targeted by this assay, and inadequate number of viral ?copies(<138 copies/mL). A negative result must be combined with ?clinical observations, patient history, and epidemiological ?information. The expected result is Negative. ? ?Fact Sheet for Patients:  ?EntrepreneurPulse.com.au ? ?Fact Sheet for Healthcare Providers:  ?IncredibleEmployment.be ? ?This test is no t yet approved or cleared by the [place] FDA and  ?has been authorized for detection and/or diagnosis of SARS-CoV-2 by ?FDA under an Emergency Use Authorization (EUA). This EUA will remain  ?in effect (meaning this test can be used) for the duration of the ?COVID-19 declaration under Section 564(b)(1) of the Act, 21 ?U.S.C.section 360bbb-3(b)(1), unless the authorization  is terminated  ?or revoked sooner.  ? ? ?  ? Influenza A by PCR NEGATIVE NEGATIVE Final  ? Influenza B by PCR NEGATIVE NEGATIVE Final  ?  Comment: (NOTE) ?The Xpert Xpress SARS-CoV-2/FLU/RSV plus assay is intended as an aid ?in the diagnosis of influenza from Nasopharyngeal swab specimens and ?should not be used as a sole basis for treatment. Nasal washings and ?aspirates are unacceptable for Xpert Xpress SARS-CoV-2/FLU/RSV ?testing. ? ?Fact Sheet for Patients: ?EntrepreneurPulse.com.au ? ?Fact Sheet for Healthcare Providers: ?IncredibleEmployment.be ? ?This test is not yet approved or cleared by the [place] FDA  and ?has been authorized for detection and/or diagnosis of SARS-CoV-2 by ?FDA under an Emergency Use Authorization (EUA). This EUA will remain ?in effect (meaning this test can be used) for the duration of th

## 2021-10-08 DIAGNOSIS — D5700 Hb-SS disease with crisis, unspecified: Secondary | ICD-10-CM | POA: Diagnosis not present

## 2021-10-08 LAB — BASIC METABOLIC PANEL
Anion gap: 5 (ref 5–15)
BUN: 8 mg/dL (ref 6–20)
CO2: 27 mmol/L (ref 22–32)
Calcium: 8.7 mg/dL — ABNORMAL LOW (ref 8.9–10.3)
Chloride: 104 mmol/L (ref 98–111)
Creatinine, Ser: 0.67 mg/dL (ref 0.44–1.00)
GFR, Estimated: >60 mL/min (ref >60)
Glucose, Bld: 93 mg/dL (ref 70–99)
Potassium: 4.5 mmol/L (ref 3.5–5.1)
Sodium: 136 mmol/L (ref 135–145)

## 2021-10-08 LAB — CBC
HCT: 20.5 % — ABNORMAL LOW (ref 36.0–46.0)
Hemoglobin: 7 g/dL — ABNORMAL LOW (ref 12.0–15.0)
MCH: 28.6 pg (ref 26.0–34.0)
MCHC: 34.1 g/dL (ref 30.0–36.0)
MCV: 83.7 fL (ref 80.0–100.0)
Platelets: 645 10*3/uL — ABNORMAL HIGH (ref 150–400)
RBC: 2.45 MIL/uL — ABNORMAL LOW (ref 3.87–5.11)
RDW: 25.7 % — ABNORMAL HIGH (ref 11.5–15.5)
WBC: 14.8 10*3/uL — ABNORMAL HIGH (ref 4.0–10.5)
nRBC: 1.2 % — ABNORMAL HIGH (ref 0.0–0.2)

## 2021-10-08 MED ORDER — HYDROMORPHONE HCL 4 MG PO TABS: 4.0000 mg | ORAL_TABLET | Freq: Four times a day (QID) | ORAL | 0 refills | Status: AC | PRN

## 2021-10-08 MED ORDER — HEPARIN SOD (PORK) LOCK FLUSH 100 UNIT/ML IV SOLN
INTRAVENOUS | Status: AC
  Administered 2021-10-08: 500 [IU]
  Filled 2021-10-08: qty 5

## 2021-10-08 MED ORDER — HEPARIN SOD (PORK) LOCK FLUSH 100 UNIT/ML IV SOLN: 500.0000 [IU] | Freq: Once | INTRAVENOUS | Status: DC

## 2021-10-08 MED ORDER — CHLORHEXIDINE GLUCONATE CLOTH 2 % EX PADS
6.0000 | MEDICATED_PAD | Freq: Every day | CUTANEOUS | Status: DC
  Administered 2021-10-08: 6 via TOPICAL

## 2021-10-08 NOTE — Care Management Important Message (Signed)
Important Message ? ?Patient Details IM Letter given to the Patient. ?Name: [facility] ?MRN: [id number] ?Date of Birth: Dec 28, 1980 ? ? ?Medicare Important Message Given:  Yes ? ? ? ? ?Kerin Salen ?10/08/2021, 9:47 AM ?

## 2021-10-11 NOTE — Discharge Summary (Addendum)
Physician Discharge Summary  ?[name]:[id number] DOB: 04-Jun-1981 DOA: 10/04/2021 ? ?PCP: Vevelyn Francois, NP ? ?Admit date: 10/04/2021 ? ?Discharge date: 10/08/2021 ? ?Discharge Diagnoses:  ?Principal Problem: ?  Acute sickle cell crisis ([place]) ?Active Problems: ?  Hx of pulmonary embolism ([place]) ?  Gastro-esophageal reflux disease without esophagitis ?  Mild intermittent asthma without complication ? ? ?Discharge Condition: Stable ? ?Disposition:  ? Follow-up Information   ? ? Bo Merino I, NP Follow up in 1 week(s).   ?Specialty: Nurse Practitioner ?Contact information: ?[address] ?[place] [address] ?[phone] ? ? ?  ?  ? ?  ?  ? ?  ? ?Pt is discharged home in good condition and is to follow up with Vevelyn Francois, NP this week to have labs evaluated. [facility] is instructed to increase activity slowly and balance with rest for the next few days, and use prescribed medication to complete treatment of pain ? ?Diet: Regular ?Wt Readings from Last 3 Encounters:  ?10/05/21 53 kg  ?06/07/21 55 kg  ?04/25/21 54 kg  ? ? ?History of present illness:  ?Molly Gutierrez is a 41 year old female with a past medical history of sickle cell anemia, mild intermittent asthma, GERD, and history of pulmonary embolism currently on Xarelto who presents to [facility] emergency department with complaints of back pain.  Patient explains that proximately 2 days ago she began to develop low back pain as well as left hip pain.  Pain is severe in intensity, sharp in quality without any alleviating or exacerbating factors.  Patient that this pain is with her previous sickle cell crises.  Patient has been attempting to take her home regimen of opiate-based analgesics without any improvement in symptoms.  Denies any associated fevers, sick contacts, recent travel. ?Patient symptoms continue to worsen over the following 2 days until she eventually presented to [facility]  emergency department for evaluation. ? ?Upon evaluation in the emergency department, urinalysis and chest x-ray were unremarkable.  Hemoglobin was noted to have significantly dropped to 6.7 g/dL, down from 8 g/dL in December 2022, however patient denied any chest pain or shortness of breath and exhibited no hypoxia.  Patient was given several doses of IV Dilaudid as well as being initiated on IV fluids and the hospitalist group was then called to assess the patient for admission to the hospital. ? ?Hospital Course:  ?Sickle cell disease with pain crisis: ?Patient was admitted for sickle cell pain crisis and managed appropriately with IVF, IV Dilaudid via PCA and IV Toradol, as well as other adjunct therapies per sickle cell pain management protocols. ?Patient's pain intensity is 5/10.  She will be discharged home on her home medication regimen.  Dilaudid 4 mg every 6 hours as needed #16 was sent to patient's pharmacy.  PDMP substance reporting system was reviewed prior to prescribing opiate medications, no inconsistencies noted. ?Patient is alert, oriented, and ambulating without assistance. ?Patient was therefore discharged home today in a hemodynamically stable condition.  ? ?Sylvana will follow-up with PCP within 1 week of this discharge. Verenis was counseled extensively about nonpharmacologic means of pain management, patient verbalized understanding and was appreciative of  the care received during this admission.  ? ?We discussed the need for good hydration, monitoring of hydration status, avoidance of heat, cold, stress, and infection triggers. We discussed the need to be adherent to taking  home medications. Patient was reminded of the need to seek medical attention immediately if any  symptom of bleeding, anemia, or infection occurs. ? ?Discharge Exam: ?Vitals:  ? 10/08/21 1023 10/08/21 1326  ?BP: 112/82 91/63  ?Pulse: 97 86  ?Resp: 13 14  ?Temp: 98.8 ?F (37.1 ?C) 98.7 ?F (37.1 ?C)  ?SpO2: (!) 88% 96%   ? ?Vitals:  ? 10/08/21 0425 10/08/21 0745 10/08/21 1023 10/08/21 1326  ?BP: 113/78  112/82 91/63  ?Pulse: 93  97 86  ?Resp:  '18 13 14  '$ ?Temp: 98.3 ?F (36.8 ?C)  98.8 ?F (37.1 ?C) 98.7 ?F (37.1 ?C)  ?TempSrc: Oral  Oral Oral  ?SpO2: 97% 94% (!) 88% 96%  ?Weight:      ?Height:      ? ? ?General appearance : Awake, alert, not in any distress. Speech Clear. Not toxic looking ?HEENT: Atraumatic and Normocephalic, pupils equally reactive to light and accomodation ?Neck: Supple, no JVD. No cervical lymphadenopathy.  ?Chest: Good air entry bilaterally, no added sounds  ?CVS: S1 S2 regular, no murmurs.  ?Abdomen: Bowel sounds present, Non tender and not distended with no gaurding, rigidity or rebound. ?Extremities: B/L Lower Ext shows no edema, both legs are warm to touch ?Neurology: Awake alert, and oriented X 3, CN II-XII intact, Non focal ?Skin: No Rash ? ?Discharge Instructions ? ?Discharge Instructions   ? ? Discharge patient   Complete by: As directed ?  ? Discharge disposition: 01-Home or Self Care  ? Discharge patient date: 10/08/2021  ? ?  ? ?Allergies as of 10/08/2021   ? ?   Reactions  ? Cefaclor Hives, Swelling  ? Hydroxyurea Palpitations, Other (See Comments)  ? Lowers "blood levels" and heart rate (causes HYPOtension); ?"it messes me up, it drops my levels and stuff"  ? Omeprazole Anaphylaxis, Other (See Comments)  ? Causes "sharp pains in the stomach"  ? Tomato Other (See Comments)  ? Break out   ? Ketamine Palpitations, Other (See Comments)  ? "Pt states she has had previous reaction to ketamine. States she becomes flushed, heart races, dizzy, and feels like she is going to pass out."  ? ?  ? ?  ?Medication List  ?  ? ?TAKE these medications   ? ?albuterol 108 (90 Base) MCG/ACT inhaler ?Commonly known as: VENTOLIN HFA ?Inhale 2 puffs into the lungs 2 (two) times daily as needed for wheezing or shortness of breath. ?  ?cholecalciferol 25 MCG (1000 UNIT) tablet ?Commonly known as: VITAMIN D3 ?Take 1,000 Units by  mouth daily. ?  ?Deferasirox 360 MG Tabs ?Take 1,080 mg by mouth at bedtime. ?  ?diphenhydrAMINE 25 mg capsule ?Commonly known as: BENADRYL ?Take 25 mg by mouth 3 (three) times daily as needed for itching. ?  ?fluticasone 50 MCG/ACT nasal spray ?Commonly known as: FLONASE ?Place 1 spray into both nostrils daily as needed for allergies or rhinitis. ?  ?folic acid 1 MG tablet ?Commonly known as: FOLVITE ?Take 1 mg by mouth daily. ?  ?HYDROmorphone 4 MG tablet ?Commonly known as: DILAUDID ?Take 1 tablet (4 mg total) by mouth every 6 (six) hours as needed for up to 15 days for moderate pain or severe pain. ?  ?mirtazapine 45 MG tablet ?Commonly known as: REMERON ?Take 1 tablet (45 mg total) by mouth at bedtime. ?  ?mometasone-formoterol 100-5 MCG/ACT Aero ?Commonly known as: DULERA ?Inhale 2 puffs into the lungs daily as needed for wheezing or shortness of breath. ?  ?morphine 15 MG 12 hr tablet ?Commonly known as: MS CONTIN ?Take 1 tablet (15 mg total) by mouth every 12 (  twelve) hours. Must last 30 days. ?  ?omeprazole 20 MG capsule ?Commonly known as: PRILOSEC ?Take 1 capsule (20 mg total) by mouth 2 (two) times daily before a meal. ?What changed:  ?when to take this ?reasons to take this ?  ?polyethylene glycol 17 g packet ?Commonly known as: MIRALAX / GLYCOLAX ?Take 17 g by mouth daily as needed for mild constipation. ?  ?promethazine 25 MG tablet ?Commonly known as: PHENERGAN ?Take 0.5-1 tablets (12.5-25 mg total) by mouth every 6 (six) hours as needed for nausea or vomiting. ?  ?VITAMIN B-12 PO ?Take 1 tablet by mouth daily. ?  ?Xarelto 20 MG Tabs tablet ?Generic drug: rivaroxaban ?Take 1 tablet (20 mg total) by mouth daily with supper. ?  ? ?  ? ? ?The results of significant diagnostics from this hospitalization (including imaging, microbiology, ancillary and laboratory) are listed below for reference.   ? ?Significant Diagnostic Studies: ?DG Chest Portable 1 View ? ?Result Date: 10/04/2021 ?CLINICAL DATA:  Sickle  cell crisis. EXAM: PORTABLE CHEST 1 VIEW COMPARISON:  June 02, 2021 FINDINGS: Stable bilateral venous Port-A-Cath positioning is noted. The heart size and mediastinal contours are within normal limits. B

## 2021-10-17 ENCOUNTER — Telehealth: Payer: Self-pay | Admitting: Internal Medicine

## 2021-10-17 NOTE — Telephone Encounter (Signed)
Morphine 15 mg refill request to [facility] in [place] ?

## 2021-10-26 ENCOUNTER — Other Ambulatory Visit: Payer: Self-pay | Admitting: Nurse Practitioner

## 2021-10-26 NOTE — Telephone Encounter (Signed)
Pain med refill request. She has appointment on Monday with Tonya. Formerly Crystal's pt ?

## 2021-10-29 ENCOUNTER — Non-Acute Institutional Stay (HOSPITAL_COMMUNITY): Admission: RE | Admit: 2021-10-29 | Payer: Medicare HMO | Source: Ambulatory Visit

## 2021-10-29 ENCOUNTER — Ambulatory Visit (INDEPENDENT_AMBULATORY_CARE_PROVIDER_SITE_OTHER): Payer: Medicare HMO | Admitting: Nurse Practitioner

## 2021-10-29 ENCOUNTER — Encounter: Payer: Self-pay | Admitting: Nurse Practitioner

## 2021-10-29 VITALS — BP 111/84 | HR 96 | Temp 98.5°F | Ht 63.0 in | Wt 107.4 lb

## 2021-10-29 DIAGNOSIS — D5700 Hb-SS disease with crisis, unspecified: Secondary | ICD-10-CM | POA: Diagnosis not present

## 2021-10-29 DIAGNOSIS — G894 Chronic pain syndrome: Secondary | ICD-10-CM | POA: Diagnosis not present

## 2021-10-29 MED ORDER — MOMETASONE FURO-FORMOTEROL FUM 100-5 MCG/ACT IN AERO: 2.0000 | INHALATION_SPRAY | Freq: Every day | RESPIRATORY_TRACT | 5 refills | Status: DC | PRN

## 2021-10-29 MED ORDER — XARELTO 20 MG PO TABS: 20.0000 mg | ORAL_TABLET | Freq: Every day | ORAL | 3 refills | Status: DC

## 2021-10-29 MED ORDER — FLUTICASONE PROPIONATE 50 MCG/ACT NA SUSP: 1.0000 | Freq: Every day | NASAL | 0 refills | Status: DC | PRN

## 2021-10-29 MED ORDER — ALBUTEROL SULFATE HFA 108 (90 BASE) MCG/ACT IN AERS: 2.0000 | INHALATION_SPRAY | Freq: Four times a day (QID) | RESPIRATORY_TRACT | 0 refills | Status: DC | PRN

## 2021-10-29 MED ORDER — MIRTAZAPINE 45 MG PO TABS: 45.0000 mg | ORAL_TABLET | Freq: Every day | ORAL | 3 refills | Status: DC

## 2021-10-29 MED ORDER — MORPHINE SULFATE 15 MG PO TABS
15.0000 mg | ORAL_TABLET | Freq: Two times a day (BID) | ORAL | 0 refills | Status: DC | PRN
Start: 2021-10-29 — End: 2021-10-29

## 2021-10-29 NOTE — Patient Instructions (Addendum)
1. Chronic pain syndrome  - [id number] 11+Oxyco+Alc+Crt-Bund  Follow up:  Follow up in 3 months or sooner if needed   Sickle Cell Anemia, Adult  Sickle cell anemia is a condition where your red blood cells are shaped like sickles. Red blood cells carry oxygen through the body. Sickle-shaped cells do not live as long as normal red blood cells. They also clump together and block blood from flowing through the blood vessels. This prevents the body from getting enough oxygen. Sickle cell anemia causes organ damage and pain. It also increases the risk of infection. Follow these instructions at home: Medicines Take over-the-counter and prescription medicines only as told by your doctor. If you were prescribed an antibiotic medicine, take it as told by your doctor. Do not stop taking the antibiotic even if you start to feel better. If you develop a fever, do not take medicines to lower the fever right away. Tell your doctor about the fever. Managing pain, stiffness, and swelling Try these methods to help with pain: Use a heating pad. Take a warm bath. Distract yourself, such as by watching TV. Eating and drinking Drink enough fluid to keep your pee (urine) clear or pale yellow. Drink more in hot weather and during exercise. Limit or avoid alcohol. Eat a healthy diet. Eat plenty of fruits, vegetables, whole grains, and lean protein. Take vitamins and supplements as told by your doctor. Traveling When traveling, keep these with you: Your medical information. The names of your doctors. Your medicines. If you need to take an airplane, talk to your doctor first. Activity Rest often. Avoid exercises that make your heart beat much faster, such as jogging. General instructions Do not use products that have nicotine or tobacco, such as cigarettes and e-cigarettes. If you need help quitting, ask your doctor. Consider wearing a medical alert bracelet. Avoid being in high places (high altitudes),  such as mountains. Avoid very hot or cold temperatures. Avoid places where the temperature changes a lot. Keep all follow-up visits as told by your doctor. This is important. Contact a doctor if: A joint hurts. Your feet or hands hurt or swell. You feel tired (fatigued). Get help right away if: You have symptoms of infection. These include: Fever. Chills. Being very tired. Irritability. Poor eating. Throwing up (vomiting). You feel dizzy or faint. You have new stomach pain, especially on the left side. You have a an erection (priapism) that lasts more than 4 hours. You have numbness in your arms or legs. You have a hard time moving your arms or legs. You have trouble talking. You have pain that does not go away when you take medicine. You are short of breath. You are breathing fast. You have a long-term cough. You have pain in your chest. You have a bad headache. You have a stiff neck. Your stomach looks bloated even though you did not eat much. Your skin is pale. You suddenly cannot see well. Summary Sickle cell anemia is a condition where your red blood cells are shaped like sickles. Follow your doctor's advice on ways to manage pain, food to eat, activities to do, and steps to take for safe travel. Get medical help right away if you have any signs of infection, such as a fever. This information is not intended to replace advice given to you by your health care provider. Make sure you discuss any questions you have with your health care provider. Document Revised: 10/24/2019 Document Reviewed: 10/28/2019 Elsevier Patient Education  [place].

## 2021-10-29 NOTE — Progress Notes (Signed)
$'@Patient'K$  ID: Molly Gutierrez, female    DOB: 07/30/1980, 41 y.o.   MRN: [id number]  Chief Complaint  Patient presents with   Follow-up    Pt is here for 6 weeks follow up visit. Pt is requesting a refill on all her medications    Referring provider: Vevelyn Francois, NP   HPI  [facility] presents for follow up  She  has a past medical history of Asthma, Eczema, History of pulmonary embolus (PE), and Sickle cell anemia ([place]).    Patient presents today for a follow up visit for sickle cell. She states that she has been stable. No new issues or concerns today. Patient does need a refill on pain medications. Denies f/c/s, n/v/d, hemoptysis, PND, leg swelling Denies chest pain or edema.      Allergies  Allergen Reactions   Cefaclor Hives and Swelling   Hydroxyurea Palpitations and Other (See Comments)    Lowers "blood levels" and heart rate (causes HYPOtension); "it messes me up, it drops my levels and stuff"    Omeprazole Anaphylaxis and Other (See Comments)    Causes "sharp pains in the stomach"   Tomato Other (See Comments)    Break out    Ketamine Palpitations and Other (See Comments)    "Pt states she has had previous reaction to ketamine. States she becomes flushed, heart races, dizzy, and feels like she is going to pass out."    Immunization History  Administered Date(s) Administered   PFIZER Comirnaty(Gray Top)Covid-19 Tri-Sucrose Vaccine 09/25/2019, 10/16/2019   PFIZER(Purple Top)SARS-COV-2 Vaccination 09/25/2019, 10/16/2019   Pfizer Covid-19 Vaccine Bivalent Booster 16yr & up 09/25/2019, 10/16/2019   Tdap 07/09/2019    Past Medical History:  Diagnosis Date   Asthma    Eczema    History of pulmonary embolus (PE)    Sickle cell anemia (H[facility])     Tobacco History: Social History   Tobacco Use  Smoking Status Never  Smokeless Tobacco Never   Counseling given: Not Answered   Outpatient Encounter Medications as of 10/29/2021  Medication Sig    albuterol (VENTOLIN HFA) 108 (90 Base) MCG/ACT inhaler Inhale 2 puffs into the lungs every 6 (six) hours as needed for wheezing or shortness of breath.   cholecalciferol (VITAMIN D3) 25 MCG (1000 UNIT) tablet Take 1,000 Units by mouth daily.   Cyanocobalamin (VITAMIN B-12 PO) Take 1 tablet by mouth daily.   Deferasirox 360 MG TABS Take 1,080 mg by mouth at bedtime.   diphenhydrAMINE (BENADRYL) 25 mg capsule Take 25 mg by mouth 3 (three) times daily as needed for itching.   folic acid (FOLVITE) 1 MG tablet Take 1 mg by mouth daily.   omeprazole (PRILOSEC) 20 MG capsule Take 1 capsule (20 mg total) by mouth 2 (two) times daily before a meal. (Patient taking differently: Take 20 mg by mouth daily as needed (heartburn).)   polyethylene glycol (MIRALAX / GLYCOLAX) 17 g packet Take 17 g by mouth daily as needed for mild constipation.   [DISCONTINUED] fluticasone (FLONASE) 50 MCG/ACT nasal spray Place 1 spray into both nostrils daily as needed for allergies or rhinitis.   [DISCONTINUED] HYDROmorphone (DILAUDID) 4 MG tablet Take by mouth.   [DISCONTINUED] mirtazapine (REMERON) 45 MG tablet Take 1 tablet (45 mg total) by mouth at bedtime.   [DISCONTINUED] morphine (MSIR) 15 MG tablet Take 1 tablet (15 mg total) by mouth 2 (two) times daily as needed for up to 15 days for severe pain.   [DISCONTINUED] XARELTO 20 MG TABS  tablet Take 1 tablet (20 mg total) by mouth daily with supper.   albuterol (VENTOLIN HFA) 108 (90 Base) MCG/ACT inhaler Inhale 2 puffs into the lungs 2 (two) times daily as needed for wheezing or shortness of breath.   fluticasone (FLONASE) 50 MCG/ACT nasal spray Place 1 spray into both nostrils daily as needed for allergies or rhinitis.   mirtazapine (REMERON) 45 MG tablet Take 1 tablet (45 mg total) by mouth at bedtime.   mometasone-formoterol (DULERA) 100-5 MCG/ACT AERO Inhale 2 puffs into the lungs daily as needed for wheezing or shortness of breath.   XARELTO 20 MG TABS tablet Take 1  tablet (20 mg total) by mouth daily with supper.   [DISCONTINUED] mometasone-formoterol (DULERA) 100-5 MCG/ACT AERO Inhale 2 puffs into the lungs daily as needed for wheezing or shortness of breath.   [DISCONTINUED] promethazine (PHENERGAN) 25 MG tablet Take 0.5-1 tablets (12.5-25 mg total) by mouth every 6 (six) hours as needed for nausea or vomiting.   No facility-administered encounter medications on file as of 10/29/2021.     Review of Systems  Review of Systems  Constitutional: Negative.   HENT: Negative.    Cardiovascular: Negative.   Gastrointestinal: Negative.   Allergic/Immunologic: Negative.   Neurological: Negative.   Psychiatric/Behavioral: Negative.        Physical Exam  BP 111/84 (BP Location: Right Arm, Patient Position: Sitting, Cuff Size: Normal)   Pulse 96   Temp 98.5 F (36.9 C)   Ht '5\' 3"'$  (1.6 m)   Wt 107 lb 6.4 oz (48.7 kg)   LMP 10/23/2021 (Exact Date)   SpO2 100%   BMI 19.03 kg/m   Wt Readings from Last 5 Encounters:  11/01/21 107 lb (48.5 kg)  10/29/21 107 lb 6.4 oz (48.7 kg)  10/05/21 116 lb 13.5 oz (53 kg)  06/07/21 121 lb 4.1 oz (55 kg)  04/25/21 119 lb (54 kg)     Physical Exam Vitals and nursing note reviewed.  Constitutional:      General: She is not in acute distress.    Appearance: She is well-developed.  Cardiovascular:     Rate and Rhythm: Normal rate and regular rhythm.  Pulmonary:     Effort: Pulmonary effort is normal.     Breath sounds: Normal breath sounds.  Neurological:     Mental Status: She is alert and oriented to person, place, and time.     Lab Results:  CBC    Component Value Date/Time   WBC 16.2 (H) 11/01/2021 1106   RBC 2.63 (L) 11/01/2021 1106   RBC 2.62 (L) 11/01/2021 1106   HGB 7.9 (L) 11/01/2021 1106   HGB 7.8 (L) 08/02/2020 1538   HCT 22.5 (L) 11/01/2021 1106   HCT 22.3 (L) 08/02/2020 1538   PLT 638 (H) 11/01/2021 1106   PLT 560 (H) 08/02/2020 1538   MCV 85.9 11/01/2021 1106   MCV 92  08/02/2020 1538   MCH 30.2 11/01/2021 1106   MCHC 35.1 11/01/2021 1106   RDW 23.2 (H) 11/01/2021 1106   RDW 23.8 (H) 08/02/2020 1538   LYMPHSABS 3.1 11/01/2021 1106   LYMPHSABS 3.0 08/02/2020 1538   MONOABS 1.9 (H) 11/01/2021 1106   EOSABS 0.1 11/01/2021 1106   EOSABS 0.4 08/02/2020 1538   BASOSABS 0.1 11/01/2021 1106   BASOSABS 0.1 08/02/2020 1538    BMET    Component Value Date/Time   NA 136 11/01/2021 1106   NA 137 08/02/2020 1538   K 3.4 (L) 11/01/2021 1106  CL 108 11/01/2021 1106   CO2 22 11/01/2021 1106   GLUCOSE 105 (H) 11/01/2021 1106   BUN 10 11/01/2021 1106   BUN 7 08/02/2020 1538   CREATININE 0.45 11/01/2021 1106   CALCIUM 9.0 11/01/2021 1106   GFRNONAA >60 11/01/2021 1106   GFRAA 136 08/02/2020 1538    BNP    Component Value Date/Time   BNP 70.6 05/05/2019 0204    ProBNP No results found for: PROBNP  Imaging: DG Chest Port 1 View  Result Date: 11/01/2021 CLINICAL DATA:  Chest pain, sickle cell crisis EXAM: PORTABLE CHEST 1 VIEW COMPARISON:  10/04/2021 FINDINGS: Unchanged position of the bilateral chest ports and nerve catheters. Cardiac and mediastinal contours are within normal limits. No focal pulmonary opacity. No pleural effusion or pneumothorax. No acute osseous abnormality. IMPRESSION: No acute cardiopulmonary process. Electronically Signed   By: Merilyn Baba M.D.   On: 11/01/2021 12:16     Assessment & Plan:   Chronic pain syndrome - [id number] 11+Oxyco+Alc+Crt-Bund  Follow up:  Follow up in 3 months or sooner if needed  Patient Instructions  1. Chronic pain syndrome  - [id number] 11+Oxyco+Alc+Crt-Bund  Follow up:  Follow up in 3 months or sooner if needed   Sickle Cell Anemia, Adult  Sickle cell anemia is a condition where your red blood cells are shaped like sickles. Red blood cells carry oxygen through the body. Sickle-shaped cells do not live as long as normal red blood cells. They also clump together and block blood from flowing  through the blood vessels. This prevents the body from getting enough oxygen. Sickle cell anemia causes organ damage and pain. It also increases the risk of infection. Follow these instructions at home: Medicines Take over-the-counter and prescription medicines only as told by your doctor. If you were prescribed an antibiotic medicine, take it as told by your doctor. Do not stop taking the antibiotic even if you start to feel better. If you develop a fever, do not take medicines to lower the fever right away. Tell your doctor about the fever. Managing pain, stiffness, and swelling Try these methods to help with pain: Use a heating pad. Take a warm bath. Distract yourself, such as by watching TV. Eating and drinking Drink enough fluid to keep your pee (urine) clear or pale yellow. Drink more in hot weather and during exercise. Limit or avoid alcohol. Eat a healthy diet. Eat plenty of fruits, vegetables, whole grains, and lean protein. Take vitamins and supplements as told by your doctor. Traveling When traveling, keep these with you: Your medical information. The names of your doctors. Your medicines. If you need to take an airplane, talk to your doctor first. Activity Rest often. Avoid exercises that make your heart beat much faster, such as jogging. General instructions Do not use products that have nicotine or tobacco, such as cigarettes and e-cigarettes. If you need help quitting, ask your doctor. Consider wearing a medical alert bracelet. Avoid being in high places (high altitudes), such as mountains. Avoid very hot or cold temperatures. Avoid places where the temperature changes a lot. Keep all follow-up visits as told by your doctor. This is important. Contact a doctor if: A joint hurts. Your feet or hands hurt or swell. You feel tired (fatigued). Get help right away if: You have symptoms of infection. These include: Fever. Chills. Being very tired. Irritability. Poor  eating. Throwing up (vomiting). You feel dizzy or faint. You have new stomach pain, especially on the left side. You have a  an erection (priapism) that lasts more than 4 hours. You have numbness in your arms or legs. You have a hard time moving your arms or legs. You have trouble talking. You have pain that does not go away when you take medicine. You are short of breath. You are breathing fast. You have a long-term cough. You have pain in your chest. You have a bad headache. You have a stiff neck. Your stomach looks bloated even though you did not eat much. Your skin is pale. You suddenly cannot see well. Summary Sickle cell anemia is a condition where your red blood cells are shaped like sickles. Follow your doctor's advice on ways to manage pain, food to eat, activities to do, and steps to take for safe travel. Get medical help right away if you have any signs of infection, such as a fever. This information is not intended to replace advice given to you by your health care provider. Make sure you discuss any questions you have with your health care provider. Document Revised: 10/24/2019 Document Reviewed: 10/28/2019 Elsevier Patient Education  [place], [place] 11/05/2021

## 2021-10-30 ENCOUNTER — Telehealth: Payer: Self-pay

## 2021-10-30 NOTE — Telephone Encounter (Signed)
Hydromorphone Morphine 

## 2021-10-31 ENCOUNTER — Other Ambulatory Visit: Payer: Self-pay | Admitting: Nurse Practitioner

## 2021-10-31 MED ORDER — HYDROMORPHONE HCL 4 MG PO TABS: 4.0000 mg | ORAL_TABLET | ORAL | 0 refills | Status: DC | PRN

## 2021-10-31 MED ORDER — PROMETHAZINE HCL 25 MG PO TABS: 12.5000 mg | ORAL_TABLET | Freq: Four times a day (QID) | ORAL | 11 refills | Status: DC | PRN

## 2021-10-31 NOTE — Telephone Encounter (Signed)
I forwarded this to Dr. Doreene Burke - I do not see her pain medications on the list. I did refill phenergan. Thanks.  ?

## 2021-11-01 ENCOUNTER — Emergency Department (HOSPITAL_COMMUNITY): Payer: Medicare HMO

## 2021-11-01 ENCOUNTER — Encounter (HOSPITAL_COMMUNITY): Payer: Self-pay | Admitting: Oncology

## 2021-11-01 ENCOUNTER — Emergency Department (HOSPITAL_COMMUNITY)
Admission: EM | Admit: 2021-11-01 | Discharge: 2021-11-01 | Disposition: A | Discharge status: Home / Self Care | Payer: Medicare HMO | Attending: Emergency Medicine | Admitting: Emergency Medicine

## 2021-11-01 ENCOUNTER — Other Ambulatory Visit: Payer: Self-pay

## 2021-11-01 DIAGNOSIS — Z7901 Long term (current) use of anticoagulants: Secondary | ICD-10-CM | POA: Insufficient documentation

## 2021-11-01 DIAGNOSIS — Z7951 Long term (current) use of inhaled steroids: Secondary | ICD-10-CM | POA: Insufficient documentation

## 2021-11-01 DIAGNOSIS — J45909 Unspecified asthma, uncomplicated: Secondary | ICD-10-CM | POA: Diagnosis not present

## 2021-11-01 DIAGNOSIS — R079 Chest pain, unspecified: Secondary | ICD-10-CM | POA: Diagnosis not present

## 2021-11-01 DIAGNOSIS — D5700 Hb-SS disease with crisis, unspecified: Secondary | ICD-10-CM | POA: Diagnosis present

## 2021-11-01 LAB — CBC WITH DIFFERENTIAL/PLATELET
Abs Immature Granulocytes: 0.08 10*3/uL — ABNORMAL HIGH (ref 0.00–0.07)
Basophils Absolute: 0.1 10*3/uL (ref 0.0–0.1)
Basophils Relative: 1 %
Eosinophils Absolute: 0.1 10*3/uL (ref 0.0–0.5)
Eosinophils Relative: 1 %
HCT: 22.5 % — ABNORMAL LOW (ref 36.0–46.0)
Hemoglobin: 7.9 g/dL — ABNORMAL LOW (ref 12.0–15.0)
Immature Granulocytes: 1 %
Lymphocytes Relative: 19 %
Lymphs Abs: 3.1 10*3/uL (ref 0.7–4.0)
MCH: 30.2 pg (ref 26.0–34.0)
MCHC: 35.1 g/dL (ref 30.0–36.0)
MCV: 85.9 fL (ref 80.0–100.0)
Monocytes Absolute: 1.9 10*3/uL — ABNORMAL HIGH (ref 0.1–1.0)
Monocytes Relative: 12 %
Neutro Abs: 10.8 10*3/uL — ABNORMAL HIGH (ref 1.7–7.7)
Neutrophils Relative %: 66 %
Platelets: 638 10*3/uL — ABNORMAL HIGH (ref 150–400)
RBC: 2.62 MIL/uL — ABNORMAL LOW (ref 3.87–5.11)
RDW: 23.2 % — ABNORMAL HIGH (ref 11.5–15.5)
WBC: 16.2 10*3/uL — ABNORMAL HIGH (ref 4.0–10.5)
nRBC: 0.3 % — ABNORMAL HIGH (ref 0.0–0.2)

## 2021-11-01 LAB — RETICULOCYTES
Immature Retic Fract: 30.3 % — ABNORMAL HIGH (ref 2.3–15.9)
RBC.: 2.63 MIL/uL — ABNORMAL LOW (ref 3.87–5.11)
Retic Count, Absolute: 317.5 10*3/uL — ABNORMAL HIGH (ref 19.0–186.0)
Retic Ct Pct: 12.5 % — ABNORMAL HIGH (ref 0.4–3.1)

## 2021-11-01 LAB — COMPREHENSIVE METABOLIC PANEL
ALT: 11 U/L (ref 0–44)
AST: 27 U/L (ref 15–41)
Albumin: 4.7 g/dL (ref 3.5–5.0)
Alkaline Phosphatase: 70 U/L (ref 38–126)
Anion gap: 6 (ref 5–15)
BUN: 10 mg/dL (ref 6–20)
CO2: 22 mmol/L (ref 22–32)
Calcium: 9 mg/dL (ref 8.9–10.3)
Chloride: 108 mmol/L (ref 98–111)
Creatinine, Ser: 0.45 mg/dL (ref 0.44–1.00)
GFR, Estimated: >60 mL/min (ref >60)
Glucose, Bld: 105 mg/dL — ABNORMAL HIGH (ref 70–99)
Potassium: 3.4 mmol/L — ABNORMAL LOW (ref 3.5–5.1)
Sodium: 136 mmol/L (ref 135–145)
Total Bilirubin: 3.6 mg/dL — ABNORMAL HIGH (ref 0.3–1.2)
Total Protein: 8.9 g/dL — ABNORMAL HIGH (ref 6.5–8.1)

## 2021-11-01 MED ORDER — KETOROLAC TROMETHAMINE 15 MG/ML IJ SOLN
15.0000 mg | INTRAMUSCULAR | Status: AC
  Administered 2021-11-01: 15 mg via INTRAVENOUS
  Filled 2021-11-01: qty 1

## 2021-11-01 MED ORDER — HYDROMORPHONE HCL 2 MG/ML IJ SOLN
2.0000 mg | Freq: Once | INTRAMUSCULAR | Status: AC
  Administered 2021-11-01: 2 mg via INTRAVENOUS
  Filled 2021-11-01: qty 1

## 2021-11-01 MED ORDER — HYDROMORPHONE HCL 2 MG/ML IJ SOLN
2.0000 mg | INTRAMUSCULAR | Status: AC
  Administered 2021-11-01: 2 mg via INTRAVENOUS
  Filled 2021-11-01: qty 1

## 2021-11-01 MED ORDER — ONDANSETRON HCL 4 MG/2ML IJ SOLN
4.0000 mg | Freq: Once | INTRAMUSCULAR | Status: AC
  Administered 2021-11-01: 4 mg via INTRAVENOUS
  Filled 2021-11-01: qty 2

## 2021-11-01 MED ORDER — HEPARIN SOD (PORK) LOCK FLUSH 100 UNIT/ML IV SOLN
INTRAVENOUS | Status: AC
  Filled 2021-11-01: qty 5

## 2021-11-01 MED ORDER — DIPHENHYDRAMINE HCL 50 MG/ML IJ SOLN
25.0000 mg | Freq: Once | INTRAMUSCULAR | Status: AC
  Administered 2021-11-01: 25 mg via INTRAVENOUS
  Filled 2021-11-01: qty 1

## 2021-11-01 MED ORDER — DEXTROSE-NACL 5-0.45 % IV SOLN: INTRAVENOUS | Status: DC

## 2021-11-01 MED ORDER — SODIUM CHLORIDE 0.9 % IV SOLN
25.0000 mg | Freq: Four times a day (QID) | INTRAVENOUS | Status: DC | PRN
  Administered 2021-11-01: 25 mg via INTRAVENOUS
  Filled 2021-11-01: qty 25

## 2021-11-01 NOTE — ED Provider Triage Note (Signed)
Emergency Medicine Provider Triage Evaluation Note  [facility] , a 41 y.o. female  was evaluated in triage.  Pt has history of sickle cell and reports chest pain starting yesterday.  Pain is substernal described as achy and dull.  Nonradiating and not worsened with deep inspiration.  She does have prior history of acute chest syndrome that occurred while she was hospitalized for sickle cell crisis.  Denies shortness of breath, abdominal pain, nausea, vomiting and diarrhea.  Review of Systems  Positive: Chest pain Negative: As above  Physical Exam  BP 108/82 (BP Location: Left Arm)   Pulse 89   Temp 98.1 F (36.7 C) (Oral)   Resp 20   Ht '5\' 3"'$  (1.6 m)   Wt 48.5 kg   LMP 10/23/2021 (Exact Date)   SpO2 95%   BMI 18.95 kg/m  Gen:   Awake, no distress   Resp:  Normal effort lungs CTA bilaterally MSK:   Moves extremities without difficulty  Other:    Medical Decision Making  Medically screening exam initiated at 11:11 AM.  Appropriate orders placed.  [facility] was informed that the remainder of the evaluation will be completed by another provider, this initial triage assessment does not replace that evaluation, and the importance of remaining in the ED until their evaluation is complete.     Tonye Pearson, [place] 11/01/21 1112

## 2021-11-01 NOTE — ED Provider Notes (Signed)
3:25 PM-checkout from Dr. Alvino Chapel to evaluate patient after she awakens to see if she is comfortable to go home.  4:50 PM-at this time she is awake and alert and states that she still is uncomfortable.  She is having generalized pain.  She would like 1 more dose of analgesia and Benadryl and then feels like she can go home.  5:45 PM-she now states that she is ready to go home.  Usual care plan at home.  No change in chronic plan.  MDM-chronic pain with high MME, presenting for evaluation of sickle cell crisis.  Hemoglobin stable.  Reticulocyte count is reassuring.  No evidence for acute infection or significant metabolic disorder.  Discharged in stable condition.  She does not require additional treatment in the ED or hospitalization.   Daleen Bo, MD 11/01/21 [phone]

## 2021-11-01 NOTE — ED Triage Notes (Signed)
Pt c/o sickle cell pain crisis to chest. Pt has hx of ACS.

## 2021-11-01 NOTE — ED Provider Notes (Signed)
[place] DEPT Provider Note   CSN: [id number] Arrival date & time: 11/01/21  1035     History  Chief Complaint  Patient presents with   Sickle Cell Pain Crisis    Molly Gutierrez is a 41 y.o. female.   Sickle Cell Pain Crisis Associated symptoms: chest pain   Associated symptoms: no shortness of breath   Patient presents with sickle cell pain.  Began yesterday.  Worse today.  States has been out of her pain medicines for a couple days now.  Pain is in her chest.  Typical spot for her.  No fevers or chills.  No coughing.  Thinks it could be worse due to the weather.  Is on Dilaudid and morphine at home.  Hemoglobin SS.  Baseline hemoglobin about 8.  Transfused if she gets down to the sixes.  Is on anticoagulation for pulmonary embolism states has been compliant with that medicine.   Past Medical History:  Diagnosis Date   Asthma    Eczema    History of pulmonary embolus (PE)    Sickle cell anemia (HCC)     Home Medications Prior to Admission medications   Medication Sig Start Date End Date Taking? Authorizing Provider  albuterol (VENTOLIN HFA) 108 (90 Base) MCG/ACT inhaler Inhale 2 puffs into the lungs 2 (two) times daily as needed for wheezing or shortness of breath. 04/13/20 10/04/21  Vevelyn Francois, NP  albuterol (VENTOLIN HFA) 108 (90 Base) MCG/ACT inhaler Inhale 2 puffs into the lungs every 6 (six) hours as needed for wheezing or shortness of breath. 10/29/21   Fenton Foy, NP  cholecalciferol (VITAMIN D3) 25 MCG (1000 UNIT) tablet Take 1,000 Units by mouth daily.    [provider]  Cyanocobalamin (VITAMIN B-12 PO) Take 1 tablet by mouth daily.    [provider]  Deferasirox 360 MG TABS Take 1,080 mg by mouth at bedtime. 04/23/21   [provider]  diphenhydrAMINE (BENADRYL) 25 mg capsule Take 25 mg by mouth 3 (three) times daily as needed for itching.    [provider]  fluticasone (FLONASE)  50 MCG/ACT nasal spray Place 1 spray into both nostrils daily as needed for allergies or rhinitis. 10/29/21 11/28/21  Fenton Foy, NP  folic acid (FOLVITE) 1 MG tablet Take 1 mg by mouth daily.    [provider]  HYDROmorphone (DILAUDID) 4 MG tablet Take 1 tablet (4 mg total) by mouth every 4 (four) hours as needed for up to 15 days for severe pain. 10/31/21 11/15/21  [name], MD  mirtazapine (REMERON) 45 MG tablet Take 1 tablet (45 mg total) by mouth at bedtime. 10/29/21 10/29/22  Fenton Foy, NP  mometasone-formoterol (DULERA) 100-5 MCG/ACT AERO Inhale 2 puffs into the lungs daily as needed for wheezing or shortness of breath. 10/29/21 10/29/22  Fenton Foy, NP  omeprazole (PRILOSEC) 20 MG capsule Take 1 capsule (20 mg total) by mouth 2 (two) times daily before a meal. Patient taking differently: Take 20 mg by mouth daily as needed (heartburn). 04/23/21 04/23/22  Vevelyn Francois, NP  polyethylene glycol (MIRALAX / GLYCOLAX) 17 g packet Take 17 g by mouth daily as needed for mild constipation.    [provider]  promethazine (PHENERGAN) 25 MG tablet Take 0.5-1 tablets (12.5-25 mg total) by mouth every 6 (six) hours as needed for nausea or vomiting. 10/31/21 10/31/22  Fenton Foy, NP  XARELTO 20 MG TABS tablet Take 1 tablet (20  mg total) by mouth daily with supper. 10/29/21 10/29/22  Fenton Foy, NP      Allergies    Cefaclor, Hydroxyurea, Omeprazole, Tomato, and Ketamine    Review of Systems   Review of Systems  Constitutional:  Negative for appetite change.  Respiratory:  Negative for shortness of breath.   Cardiovascular:  Positive for chest pain.  Gastrointestinal:  Negative for abdominal distention.  Neurological:  Negative for weakness.   Physical Exam Updated Vital Signs BP (!) 86/62 (BP Location: Left Arm)   Pulse 79   Temp 98.5 F (36.9 C) (Oral)   Resp 16   Ht '5\' 3"'$  (1.6 m)   Wt 48.5 kg   LMP 10/23/2021 (Exact Date)   SpO2 93%    BMI 18.95 kg/m  Physical Exam  ED Results / Procedures / Treatments   Labs (all labs ordered are listed, but only abnormal results are displayed) Labs Reviewed  CBC WITH DIFFERENTIAL/PLATELET - Abnormal; Notable for the following components:      Result Value   WBC 16.2 (*)    RBC 2.62 (*)    Hemoglobin 7.9 (*)    HCT 22.5 (*)    RDW 23.2 (*)    Platelets 638 (*)    nRBC 0.3 (*)    Neutro Abs 10.8 (*)    Monocytes Absolute 1.9 (*)    Abs Immature Granulocytes 0.08 (*)    All other components within normal limits  COMPREHENSIVE METABOLIC PANEL - Abnormal; Notable for the following components:   Potassium 3.4 (*)    Glucose, Bld 105 (*)    Total Protein 8.9 (*)    Total Bilirubin 3.6 (*)    All other components within normal limits  RETICULOCYTES    EKG EKG Interpretation  Date/Time:  Thursday Nov 01 2021 10:47:58 EDT Ventricular Rate:  100 PR Interval:  123 QRS Duration: 77 QT Interval:  361 QTC Calculation: 466 R Axis:   61 Text Interpretation: Sinus tachycardia Right atrial enlargement Abnormal R-wave progression, early transition Borderline T abnormalities, anterior leads Confirmed by Davonna Belling [phone]) on 11/01/2021 12:53:49 PM  Radiology DG Chest Port 1 View  Result Date: 11/01/2021 CLINICAL DATA:  Chest pain, sickle cell crisis EXAM: PORTABLE CHEST 1 VIEW COMPARISON:  10/04/2021 FINDINGS: Unchanged position of the bilateral chest ports and nerve catheters. Cardiac and mediastinal contours are within normal limits. No focal pulmonary opacity. No pleural effusion or pneumothorax. No acute osseous abnormality. IMPRESSION: No acute cardiopulmonary process. Electronically Signed   By: Merilyn Baba M.D.   On: 11/01/2021 12:16    Procedures Procedures    Medications Ordered in ED Medications  dextrose 5 %-0.45 % sodium chloride infusion ( Intravenous New Bag/Given 11/01/21 1201)  promethazine (PHENERGAN) 25 mg in sodium chloride 0.9 % 50 mL IVPB (0 mg  Intravenous Stopped 11/01/21 1403)  HYDROmorphone (DILAUDID) injection 2 mg (2 mg Intravenous Given 11/01/21 1158)  HYDROmorphone (DILAUDID) injection 2 mg (2 mg Intravenous Given 11/01/21 1303)  HYDROmorphone (DILAUDID) injection 2 mg (2 mg Intravenous Given 11/01/21 1333)  ketorolac (TORADOL) 15 MG/ML injection 15 mg (15 mg Intravenous Given 11/01/21 1158)  diphenhydrAMINE (BENADRYL) injection 25 mg (25 mg Intravenous Given 11/01/21 1303)    ED Course/ Medical Decision Making/ A&P                           Medical Decision Making Risk Prescription drug management.   Patient presents with sickle cell pain.  History of sickle cell disease.  Lab work shows elevated bilirubin.  Hemoglobin on the higher end of her normal range.  White count mildly elevated.  Chest x-ray reassuring without pneumonia.  Independently interpreted the x-ray.  Has been given Dilaudid per the protocol although rather somnolent at this time.  Unable to stay awake enough to answer the questions.  Will reevaluate once she has metabolized some of the medicine.  At that point decide admit versus discharge.  Also been out of her medicines at home.  Does not appear to be septic at this time.  Does not appear to have an acute chest syndrome.  Care turned over to Dr. Eulis Foster.        Final Clinical Impression(s) / ED Diagnoses Final diagnoses:  Sickle cell pain crisis [facility])    Rx / DC Orders ED Discharge Orders     None         Davonna Belling, MD 11/01/21 1512

## 2021-11-01 NOTE — Discharge Instructions (Signed)
Continue your usual treatment at home.  See your doctor as needed for problems.

## 2021-11-02 LAB — DRUG SCREEN 764883 11+OXYCO+ALC+CRT-BUND
Amphetamines, Urine: NEGATIVE ng/mL
BENZODIAZ UR QL: NEGATIVE ng/mL
Barbiturate: NEGATIVE ng/mL
Cannabinoid Quant, Ur: NEGATIVE ng/mL
Cocaine (Metabolite): NEGATIVE ng/mL
Creatinine: 30.1 mg/dL (ref 20.0–300.0)
Ethanol: NEGATIVE %
Meperidine: NEGATIVE ng/mL
Methadone Screen, Urine: NEGATIVE ng/mL
OPIATE SCREEN URINE: NEGATIVE ng/mL
Phencyclidine: NEGATIVE ng/mL
Propoxyphene: NEGATIVE ng/mL
Tramadol: NEGATIVE ng/mL
pH, Urine: 5.6 (ref 4.5–8.9)

## 2021-11-02 LAB — OXYCODONE/OXYMORPHONE, CONFIRM
OXYCODONE/OXYMORPH: POSITIVE — AB
OXYCODONE: NEGATIVE
OXYMORPHONE (GC/MS): 469 ng/mL
OXYMORPHONE: POSITIVE — AB

## 2021-11-05 NOTE — Assessment & Plan Note (Signed)
-   [id number] 11+Oxyco+Alc+Crt-Bund  Follow up:  Follow up in 3 months or sooner if needed

## 2021-11-09 ENCOUNTER — Other Ambulatory Visit: Payer: Self-pay | Admitting: Nurse Practitioner

## 2021-11-11 ENCOUNTER — Other Ambulatory Visit: Payer: Self-pay

## 2021-11-11 ENCOUNTER — Encounter (HOSPITAL_COMMUNITY): Payer: Self-pay

## 2021-11-11 ENCOUNTER — Inpatient Hospital Stay (HOSPITAL_COMMUNITY)
Admission: EM | Admit: 2021-11-11 | Discharge: 2021-11-15 | DRG: 812 | Disposition: A | Discharge status: Home / Self Care | Payer: Medicare HMO | Attending: Internal Medicine | Admitting: Internal Medicine

## 2021-11-11 DIAGNOSIS — Z7901 Long term (current) use of anticoagulants: Secondary | ICD-10-CM

## 2021-11-11 DIAGNOSIS — Z86711 Personal history of pulmonary embolism: Secondary | ICD-10-CM | POA: Diagnosis present

## 2021-11-11 DIAGNOSIS — D638 Anemia in other chronic diseases classified elsewhere: Secondary | ICD-10-CM | POA: Diagnosis present

## 2021-11-11 DIAGNOSIS — D5700 Hb-SS disease with crisis, unspecified: Principal | ICD-10-CM | POA: Diagnosis present

## 2021-11-11 DIAGNOSIS — Z881 Allergy status to other antibiotic agents status: Secondary | ICD-10-CM

## 2021-11-11 DIAGNOSIS — K219 Gastro-esophageal reflux disease without esophagitis: Secondary | ICD-10-CM | POA: Diagnosis present

## 2021-11-11 DIAGNOSIS — J4520 Mild intermittent asthma, uncomplicated: Secondary | ICD-10-CM | POA: Diagnosis present

## 2021-11-11 DIAGNOSIS — G894 Chronic pain syndrome: Secondary | ICD-10-CM | POA: Diagnosis present

## 2021-11-11 DIAGNOSIS — Z86718 Personal history of other venous thrombosis and embolism: Secondary | ICD-10-CM

## 2021-11-11 DIAGNOSIS — Z888 Allergy status to other drugs, medicaments and biological substances status: Secondary | ICD-10-CM

## 2021-11-11 DIAGNOSIS — Z79899 Other long term (current) drug therapy: Secondary | ICD-10-CM

## 2021-11-11 DIAGNOSIS — F1120 Opioid dependence, uncomplicated: Secondary | ICD-10-CM | POA: Diagnosis present

## 2021-11-11 DIAGNOSIS — Z9049 Acquired absence of other specified parts of digestive tract: Secondary | ICD-10-CM

## 2021-11-11 LAB — CBC WITH DIFFERENTIAL/PLATELET
Abs Immature Granulocytes: 0.08 10*3/uL — ABNORMAL HIGH (ref 0.00–0.07)
Basophils Absolute: 0.1 10*3/uL (ref 0.0–0.1)
Basophils Relative: 0 %
Eosinophils Absolute: 0.1 10*3/uL (ref 0.0–0.5)
Eosinophils Relative: 1 %
HCT: 23.9 % — ABNORMAL LOW (ref 36.0–46.0)
Hemoglobin: 8.2 g/dL — ABNORMAL LOW (ref 12.0–15.0)
Immature Granulocytes: 1 %
Lymphocytes Relative: 18 %
Lymphs Abs: 2.9 10*3/uL (ref 0.7–4.0)
MCH: 30.4 pg (ref 26.0–34.0)
MCHC: 34.3 g/dL (ref 30.0–36.0)
MCV: 88.5 fL (ref 80.0–100.0)
Monocytes Absolute: 1.8 10*3/uL — ABNORMAL HIGH (ref 0.1–1.0)
Monocytes Relative: 11 %
Neutro Abs: 11.1 10*3/uL — ABNORMAL HIGH (ref 1.7–7.7)
Neutrophils Relative %: 69 %
Platelets: 470 10*3/uL — ABNORMAL HIGH (ref 150–400)
RBC: 2.7 MIL/uL — ABNORMAL LOW (ref 3.87–5.11)
RDW: 21.9 % — ABNORMAL HIGH (ref 11.5–15.5)
WBC: 15.9 10*3/uL — ABNORMAL HIGH (ref 4.0–10.5)
nRBC: 0.3 % — ABNORMAL HIGH (ref 0.0–0.2)

## 2021-11-11 LAB — COMPREHENSIVE METABOLIC PANEL
ALT: 12 U/L (ref 0–44)
AST: 28 U/L (ref 15–41)
Albumin: 4.4 g/dL (ref 3.5–5.0)
Alkaline Phosphatase: 58 U/L (ref 38–126)
Anion gap: 6 (ref 5–15)
BUN: 9 mg/dL (ref 6–20)
CO2: 22 mmol/L (ref 22–32)
Calcium: 9.1 mg/dL (ref 8.9–10.3)
Chloride: 111 mmol/L (ref 98–111)
Creatinine, Ser: 0.46 mg/dL (ref 0.44–1.00)
GFR, Estimated: >60 mL/min (ref >60)
Glucose, Bld: 92 mg/dL (ref 70–99)
Potassium: 3.6 mmol/L (ref 3.5–5.1)
Sodium: 139 mmol/L (ref 135–145)
Total Bilirubin: 4.1 mg/dL — ABNORMAL HIGH (ref 0.3–1.2)
Total Protein: 8.4 g/dL — ABNORMAL HIGH (ref 6.5–8.1)

## 2021-11-11 LAB — RETICULOCYTES
Immature Retic Fract: 13.8 % (ref 2.3–15.9)
RBC.: 2.68 MIL/uL — ABNORMAL LOW (ref 3.87–5.11)
Retic Count, Absolute: 368.5 10*3/uL — ABNORMAL HIGH (ref 19.0–186.0)
Retic Ct Pct: 12.7 % — ABNORMAL HIGH (ref 0.4–3.1)

## 2021-11-11 MED ORDER — HYDROMORPHONE HCL 2 MG/ML IJ SOLN
2.0000 mg | INTRAMUSCULAR | Status: AC
  Administered 2021-11-11: 2 mg via INTRAVENOUS
  Filled 2021-11-11: qty 1

## 2021-11-11 MED ORDER — SODIUM CHLORIDE 0.45 % IV SOLN
INTRAVENOUS | Status: DC
Start: 2021-11-11 — End: 2021-11-15

## 2021-11-11 MED ORDER — DIPHENHYDRAMINE HCL 50 MG/ML IJ SOLN
12.5000 mg | Freq: Once | INTRAMUSCULAR | Status: AC
  Administered 2021-11-11: 12.5 mg via INTRAVENOUS
  Filled 2021-11-11: qty 12.5

## 2021-11-11 MED ORDER — HYDROMORPHONE HCL 2 MG/ML IJ SOLN
2.0000 mg | INTRAMUSCULAR | Status: AC
  Administered 2021-11-12: 2 mg via INTRAVENOUS
  Filled 2021-11-11: qty 1

## 2021-11-11 MED ORDER — PROMETHAZINE HCL 25 MG PO TABS
25.0000 mg | ORAL_TABLET | ORAL | Status: DC | PRN
  Administered 2021-11-11 – 2021-11-12 (×2): 25 mg via ORAL
  Filled 2021-11-11 (×2): qty 1

## 2021-11-11 NOTE — ED Provider Notes (Signed)
[place] DEPT Provider Note   CSN: [id number] Arrival date & time: 11/11/21  2155     History  Chief Complaint  Patient presents with   Sickle Cell Pain Crisis    Molly Gutierrez is a 41 y.o. female.  The history is provided by the patient and medical records. No language interpreter was used.  Sickle Cell Pain Crisis Location:  Lower extremity and back Severity:  Moderate Onset quality:  Gradual Duration:  3 days Similar to previous crisis episodes: yes   Timing:  Constant Progression:  Waxing and waning Chronicity:  Recurrent Relieved by:  Nothing Worsened by:  Nothing Ineffective treatments:  None tried Associated symptoms: no chest pain, no congestion, no cough, no fatigue, no fever, no headaches, no nausea, no shortness of breath, no vomiting and no wheezing       Home Medications Prior to Admission medications   Medication Sig Start Date End Date Taking? Authorizing Provider  albuterol (VENTOLIN HFA) 108 (90 Base) MCG/ACT inhaler Inhale 2 puffs into the lungs 2 (two) times daily as needed for wheezing or shortness of breath. 04/13/20 10/04/21  Vevelyn Francois, NP  albuterol (VENTOLIN HFA) 108 (90 Base) MCG/ACT inhaler Inhale 2 puffs into the lungs every 6 (six) hours as needed for wheezing or shortness of breath. 10/29/21   Fenton Foy, NP  cholecalciferol (VITAMIN D3) 25 MCG (1000 UNIT) tablet Take 1,000 Units by mouth daily.    [provider]  Cyanocobalamin (VITAMIN B-12 PO) Take 1 tablet by mouth daily.    [provider]  Deferasirox 360 MG TABS Take 1,080 mg by mouth at bedtime. 04/23/21   [provider]  diphenhydrAMINE (BENADRYL) 25 mg capsule Take 25 mg by mouth 3 (three) times daily as needed for itching.    [provider]  fluticasone (FLONASE) 50 MCG/ACT nasal spray Place 1 spray into both nostrils daily as needed for allergies or rhinitis. 10/29/21 11/28/21  Fenton Foy, NP   folic acid (FOLVITE) 1 MG tablet Take 1 mg by mouth daily.    [provider]  HYDROmorphone (DILAUDID) 4 MG tablet Take 1 tablet (4 mg total) by mouth every 4 (four) hours as needed for up to 15 days for severe pain. 10/31/21 11/15/21  [name], MD  mirtazapine (REMERON) 45 MG tablet Take 1 tablet (45 mg total) by mouth at bedtime. 10/29/21 10/29/22  Fenton Foy, NP  mometasone-formoterol (DULERA) 100-5 MCG/ACT AERO Inhale 2 puffs into the lungs daily as needed for wheezing or shortness of breath. 10/29/21 10/29/22  Fenton Foy, NP  omeprazole (PRILOSEC) 20 MG capsule Take 1 capsule (20 mg total) by mouth 2 (two) times daily before a meal. Patient taking differently: Take 20 mg by mouth daily as needed (heartburn). 04/23/21 04/23/22  Vevelyn Francois, NP  polyethylene glycol (MIRALAX / GLYCOLAX) 17 g packet Take 17 g by mouth daily as needed for mild constipation.    [provider]  promethazine (PHENERGAN) 25 MG tablet Take 0.5-1 tablets (12.5-25 mg total) by mouth every 6 (six) hours as needed for nausea or vomiting. 10/31/21 10/31/22  Fenton Foy, NP  XARELTO 20 MG TABS tablet Take 1 tablet (20 mg total) by mouth daily with supper. 10/29/21 10/29/22  Fenton Foy, NP      Allergies    Cefaclor, Hydroxyurea, Omeprazole, Tomato, and Ketamine    Review of Systems   Review of Systems  Constitutional:  Negative for chills,  fatigue and fever.  HENT:  Negative for congestion.   Respiratory:  Negative for cough, chest tightness, shortness of breath and wheezing.   Cardiovascular:  Negative for chest pain and palpitations.  Gastrointestinal:  Negative for abdominal pain, nausea and vomiting.  Genitourinary:  Negative for dysuria and enuresis.  Musculoskeletal:  Negative for back pain, neck pain and neck stiffness.  Skin:  Negative for rash and wound.  Neurological:  Negative for weakness, light-headedness and headaches.  Psychiatric/Behavioral:  Negative for  agitation.   All other systems reviewed and are negative.  Physical Exam Updated Vital Signs BP 116/75 (BP Location: Left Arm)   Pulse (!) 112   Temp 98.2 F (36.8 C) (Oral)   Resp 18   Ht '5\' 3"'$  (1.6 m)   Wt 49.9 kg   LMP 10/23/2021 (Exact Date)   SpO2 95%   BMI 19.49 kg/m  Physical Exam Vitals and nursing note reviewed.  Constitutional:      General: She is not in acute distress.    Appearance: She is well-developed. She is not ill-appearing, toxic-appearing or diaphoretic.  HENT:     Head: Normocephalic and atraumatic.     Nose: No congestion or rhinorrhea.     Mouth/Throat:     Mouth: Mucous membranes are moist.     Pharynx: No oropharyngeal exudate or posterior oropharyngeal erythema.  Eyes:     Extraocular Movements: Extraocular movements intact.     Conjunctiva/sclera: Conjunctivae normal.     Pupils: Pupils are equal, round, and reactive to light.  Cardiovascular:     Rate and Rhythm: Normal rate and regular rhythm.     Heart sounds: No murmur heard. Pulmonary:     Effort: Pulmonary effort is normal. No respiratory distress.     Breath sounds: Normal breath sounds.  Abdominal:     General: Abdomen is flat.     Palpations: Abdomen is soft.     Tenderness: There is no abdominal tenderness. There is no right CVA tenderness, left CVA tenderness, guarding or rebound.  Musculoskeletal:        General: Tenderness present. No swelling or signs of injury.     Cervical back: Neck supple. No tenderness.     Right lower leg: No edema.     Left lower leg: No edema.  Skin:    General: Skin is warm and dry.     Capillary Refill: Capillary refill takes less than 2 seconds.     Findings: No erythema or rash.  Neurological:     General: No focal deficit present.     Mental Status: She is alert.     Sensory: No sensory deficit.     Motor: No weakness.  Psychiatric:        Mood and Affect: Mood normal.    ED Results / Procedures / Treatments   Labs (all labs ordered  are listed, but only abnormal results are displayed) Labs Reviewed  CBC WITH DIFFERENTIAL/PLATELET  RETICULOCYTES  COMPREHENSIVE METABOLIC PANEL  I-STAT BETA HCG BLOOD, ED ([facility], [facility], [facility] ONLY)  TYPE AND SCREEN    EKG None  Radiology No results found.  Procedures Procedures    Medications Ordered in ED Medications  0.45 % sodium chloride infusion (has no administration in time range)  HYDROmorphone (DILAUDID) injection 2 mg (has no administration in time range)  HYDROmorphone (DILAUDID) injection 2 mg (has no administration in time range)  HYDROmorphone (DILAUDID) injection 2 mg (has no administration in time range)  diphenhydrAMINE (BENADRYL) 12.5  mg in sodium chloride 0.9 % 50 mL IVPB (has no administration in time range)  promethazine (PHENERGAN) tablet 25 mg (has no administration in time range)    ED Course/ Medical Decision Making/ A&P                           Medical Decision Making Amount and/or Complexity of Data Reviewed Labs: ordered.  Risk Prescription drug management.    Molly Gutierrez is a 41 y.o. female with a past medical history significant for sickle cell disease, asthma, previous cholecystectomy, previous PE on Xarelto who presents with low back pain and left leg pain consistent with prior sickle cell pain crisis.  Patient says that for the last few days she has had worsening pain and has not been able to manage at home.  She reports feels similar to prior.  She denies any fevers, chills, Molly Gutierrez, cough.  Denies any chest pain or shortness of breath.  Reports this does not feel anything like when she had thromboembolic disease in the past and she has not missed medications.  She denies any abdominal pain, urinary symptoms or GI symptoms.  She reports the pain is 9 of 10 severity.  She denies any trauma or other complaints.  On exam, lungs clear and chest nontender.  Abdomen nontender.  Normal bowel sounds.  Patient has bilateral paraspinal back pain and  pain in her left lateral thigh.  Normal sensation, strength, and pulse in extremities.  It is not edematous.  Patient otherwise well-appearing.  Clinically I suspect a sickle cell pain crisis.  We will get screening labs for sickle cell crisis and give pain medicines.  Anticipate that his symptoms have improved and labs are reassuring she will be stable for discharge home.  Care transferred to oncoming team to await results of sickle cell work-up and reassessment after medications.  Was waiting on the renal function to return before giving Toradol as this also helps her normally.          Final Clinical Impression(s) / ED Diagnoses Final diagnoses:  Sickle cell pain crisis ([place])    .Clinical Impression: 1. Sickle cell pain crisis [facility])     Disposition: Care transferred to Dr. Drucilla Schmidt awaiting results of sickle cell work-up and reassessment.  If symptoms have improved and she is feeling better, anticipate discharge home.  This note was prepared with assistance of Systems analyst. Occasional wrong-word or sound-a-like substitutions may have occurred due to the inherent limitations of voice recognition software.      Arnetta Odeh, Gwenyth Allegra, MD 11/11/21 [phone]

## 2021-11-11 NOTE — ED Triage Notes (Signed)
Pt reports with [facility] x 2 days. Pt complains of pain in her back and left leg. Last Dilaudid 4 mg orally taken at 3 pm

## 2021-11-11 NOTE — ED Provider Notes (Signed)
Blood pressure 116/75, pulse (!) 112, temperature 98.2 F (36.8 C), temperature source Oral, resp. rate 18, height '5\' 3"'$  (1.6 m), weight 49.9 kg, last menstrual period 10/23/2021, SpO2 95 %.  Assuming care from Dr. Sherry Ruffing.  In short, Molly Gutierrez is a 41 y.o. female with a chief complaint of Sickle Cell Pain Crisis .  Refer to the original H&P for additional details.  The current plan of care is to follow up on labs and reassess after pain medication.  12:14 AM  No severe anemia.  Reticulocyte count is elevated.  No acute kidney injury.  On reassessment patient's pain is improving.  Will give third dose of medication. Ordered PO benadryl as well with some itching.   Discussed patient's case with [facility], Dr. Alcario Drought to request admission. Patient and family (if present) updated with plan.     Margette Fast, MD 11/12/21 [phone]

## 2021-11-12 ENCOUNTER — Other Ambulatory Visit: Payer: Self-pay

## 2021-11-12 ENCOUNTER — Encounter (HOSPITAL_COMMUNITY): Payer: Self-pay | Admitting: Internal Medicine

## 2021-11-12 DIAGNOSIS — Z79899 Other long term (current) drug therapy: Secondary | ICD-10-CM | POA: Diagnosis not present

## 2021-11-12 DIAGNOSIS — Z888 Allergy status to other drugs, medicaments and biological substances status: Secondary | ICD-10-CM | POA: Diagnosis not present

## 2021-11-12 DIAGNOSIS — G894 Chronic pain syndrome: Secondary | ICD-10-CM | POA: Diagnosis present

## 2021-11-12 DIAGNOSIS — D5700 Hb-SS disease with crisis, unspecified: Secondary | ICD-10-CM | POA: Diagnosis present

## 2021-11-12 DIAGNOSIS — J4520 Mild intermittent asthma, uncomplicated: Secondary | ICD-10-CM

## 2021-11-12 DIAGNOSIS — K219 Gastro-esophageal reflux disease without esophagitis: Secondary | ICD-10-CM | POA: Diagnosis present

## 2021-11-12 DIAGNOSIS — Z86711 Personal history of pulmonary embolism: Secondary | ICD-10-CM

## 2021-11-12 DIAGNOSIS — F1120 Opioid dependence, uncomplicated: Secondary | ICD-10-CM | POA: Diagnosis present

## 2021-11-12 DIAGNOSIS — Z9049 Acquired absence of other specified parts of digestive tract: Secondary | ICD-10-CM | POA: Diagnosis not present

## 2021-11-12 DIAGNOSIS — D638 Anemia in other chronic diseases classified elsewhere: Secondary | ICD-10-CM | POA: Diagnosis present

## 2021-11-12 DIAGNOSIS — Z86718 Personal history of other venous thrombosis and embolism: Secondary | ICD-10-CM | POA: Diagnosis not present

## 2021-11-12 DIAGNOSIS — Z881 Allergy status to other antibiotic agents status: Secondary | ICD-10-CM | POA: Diagnosis not present

## 2021-11-12 DIAGNOSIS — Z7901 Long term (current) use of anticoagulants: Secondary | ICD-10-CM | POA: Diagnosis not present

## 2021-11-12 MED ORDER — RIVAROXABAN 20 MG PO TABS
20.0000 mg | ORAL_TABLET | Freq: Every day | ORAL | Status: DC
  Administered 2021-11-12 – 2021-11-14 (×3): 20 mg via ORAL
  Filled 2021-11-12 (×3): qty 1

## 2021-11-12 MED ORDER — NALOXONE HCL 0.4 MG/ML IJ SOLN: 0.4000 mg | INTRAMUSCULAR | Status: DC | PRN

## 2021-11-12 MED ORDER — DIPHENHYDRAMINE HCL 25 MG PO CAPS
25.0000 mg | ORAL_CAPSULE | ORAL | Status: DC | PRN
  Administered 2021-11-12 – 2021-11-13 (×3): 25 mg via ORAL
  Filled 2021-11-12 (×2): qty 1

## 2021-11-12 MED ORDER — DEFERASIROX 360 MG PO TABS: 1080.0000 mg | ORAL_TABLET | Freq: Every day | ORAL | Status: DC

## 2021-11-12 MED ORDER — MOMETASONE FURO-FORMOTEROL FUM 100-5 MCG/ACT IN AERO
2.0000 | INHALATION_SPRAY | Freq: Two times a day (BID) | RESPIRATORY_TRACT | Status: DC
  Administered 2021-11-12 – 2021-11-15 (×5): 2 via RESPIRATORY_TRACT
  Filled 2021-11-12: qty 8.8

## 2021-11-12 MED ORDER — VOXELOTOR 500 MG PO TABS: 1500.0000 mg | ORAL_TABLET | Freq: Every day | ORAL | Status: DC

## 2021-11-12 MED ORDER — DIPHENHYDRAMINE HCL 50 MG/ML IJ SOLN
12.5000 mg | Freq: Once | INTRAMUSCULAR | Status: AC
  Administered 2021-11-12: 12.5 mg via INTRAVENOUS
  Filled 2021-11-12: qty 1

## 2021-11-12 MED ORDER — PANTOPRAZOLE SODIUM 40 MG PO TBEC
40.0000 mg | DELAYED_RELEASE_TABLET | Freq: Two times a day (BID) | ORAL | Status: DC
  Administered 2021-11-12 – 2021-11-15 (×7): 40 mg via ORAL
  Filled 2021-11-12 (×7): qty 1

## 2021-11-12 MED ORDER — SENNOSIDES-DOCUSATE SODIUM 8.6-50 MG PO TABS
1.0000 | ORAL_TABLET | Freq: Two times a day (BID) | ORAL | Status: DC
  Administered 2021-11-12 – 2021-11-15 (×8): 1 via ORAL
  Filled 2021-11-12 (×8): qty 1

## 2021-11-12 MED ORDER — FLUTICASONE PROPIONATE 50 MCG/ACT NA SUSP: 1.0000 | Freq: Every day | NASAL | Status: DC | PRN

## 2021-11-12 MED ORDER — MIRTAZAPINE 30 MG PO TABS
45.0000 mg | ORAL_TABLET | Freq: Every day | ORAL | Status: DC
  Administered 2021-11-12 – 2021-11-14 (×4): 45 mg via ORAL
  Filled 2021-11-12: qty 2
  Filled 2021-11-12 (×3): qty 1

## 2021-11-12 MED ORDER — FOLIC ACID 1 MG PO TABS
1.0000 mg | ORAL_TABLET | Freq: Every day | ORAL | Status: DC
Start: 2021-11-12 — End: 2021-11-15
  Administered 2021-11-12 – 2021-11-15 (×4): 1 mg via ORAL
  Filled 2021-11-12 (×4): qty 1

## 2021-11-12 MED ORDER — DIPHENHYDRAMINE HCL 25 MG PO CAPS
25.0000 mg | ORAL_CAPSULE | Freq: Once | ORAL | Status: AC
  Administered 2021-11-12: 25 mg via ORAL
  Filled 2021-11-12: qty 1

## 2021-11-12 MED ORDER — ONDANSETRON HCL 4 MG/2ML IJ SOLN: 4.0000 mg | Freq: Four times a day (QID) | INTRAMUSCULAR | Status: DC | PRN

## 2021-11-12 MED ORDER — ALBUTEROL SULFATE (2.5 MG/3ML) 0.083% IN NEBU: 3.0000 mL | INHALATION_SOLUTION | Freq: Four times a day (QID) | RESPIRATORY_TRACT | Status: DC | PRN

## 2021-11-12 MED ORDER — PROMETHAZINE HCL 25 MG PO TABS
25.0000 mg | ORAL_TABLET | Freq: Once | ORAL | Status: AC
  Administered 2021-11-12: 25 mg via ORAL
  Filled 2021-11-12: qty 1

## 2021-11-12 MED ORDER — SODIUM CHLORIDE 0.9% FLUSH
9.0000 mL | INTRAVENOUS | Status: DC | PRN
Start: 2021-11-12 — End: 2021-11-15

## 2021-11-12 MED ORDER — POLYETHYLENE GLYCOL 3350 17 G PO PACK: 17.0000 g | PACK | Freq: Every day | ORAL | Status: DC | PRN

## 2021-11-12 MED ORDER — HYDROMORPHONE 1 MG/ML IV SOLN
INTRAVENOUS | Status: DC
  Administered 2021-11-12: 0.5 mg via INTRAVENOUS
  Administered 2021-11-12: 1 mg via INTRAVENOUS
  Administered 2021-11-12: 4 mg via INTRAVENOUS
  Administered 2021-11-12: 3 mg via INTRAVENOUS
  Administered 2021-11-12: 5.5 mg via INTRAVENOUS
  Administered 2021-11-13: 30 mg via INTRAVENOUS
  Administered 2021-11-13: 2.5 mg via INTRAVENOUS
  Administered 2021-11-13: 3.5 mg via INTRAVENOUS
  Administered 2021-11-13: 5 mg via INTRAVENOUS
  Administered 2021-11-13: 4.5 mg via INTRAVENOUS
  Administered 2021-11-13: 3 mg via INTRAVENOUS
  Administered 2021-11-14: 2 mg via INTRAVENOUS
  Administered 2021-11-14: 1.5 mg via INTRAVENOUS
  Filled 2021-11-12 (×2): qty 30

## 2021-11-12 NOTE — Assessment & Plan Note (Signed)
- 

## 2021-11-12 NOTE — Assessment & Plan Note (Addendum)
Due to iron overload (see outside hospital heme/onc notes). 1. Continue home chelation therapy with Desferasirox

## 2021-11-12 NOTE — Plan of Care (Signed)
Plan of care reviewed and discussed with the patient. 

## 2021-11-12 NOTE — Assessment & Plan Note (Signed)
Continue home nebs 

## 2021-11-12 NOTE — H&P (Signed)
History and Physical    PatientUchenna Gutierrez TDD:[id number] DOB: 08/30/1980 DOA: 11/11/2021 DOS: the patient was seen and examined on 11/12/2021 PCP: Fenton Foy, NP  Patient coming from: Home  Chief Complaint:  Chief Complaint  Patient presents with   Sickle Cell Pain Crisis   HPI: Molly Gutierrez is a 41 y.o. female with medical history significant of HGB SS disease, frequent admits for pain crisis, iron overload, h/o PE on Xarelto.  Pt presents to ED with c/o 3 day h/o of LE and back pain, waxing and waning, typical of her sickle cell pain symptoms.  Pain persistent despite home pain meds.  No CP, fever, cough, N/V.   Review of Systems: As mentioned in the history of present illness. All other systems reviewed and are negative. Past Medical History:  Diagnosis Date   Asthma    Eczema    History of pulmonary embolus (PE)    Sickle cell anemia ([place])    Past Surgical History:  Procedure Laterality Date   CHOLECYSTECTOMY     ERCP     JOINT REPLACEMENT     PORTA CATH INSERTION     TUBAL LIGATION     WISDOM TOOTH EXTRACTION     Social History:  reports that she has never smoked. She has never used smokeless tobacco. She reports that she does not drink alcohol and does not use drugs.  Allergies  Allergen Reactions   Cefaclor Hives and Swelling   Hydroxyurea Palpitations and Other (See Comments)    Lowers "blood levels" and heart rate (causes HYPOtension); "it messes me up, it drops my levels and stuff"    Omeprazole Other (See Comments)    Causes "sharp pains in the stomach"   Ketamine Palpitations and Other (See Comments)    "Pt states she has had previous reaction to ketamine. States she becomes flushed, heart races, dizzy, and feels like she is going to pass out."   Tomato Itching and Rash    "Break out"    Family History  Problem Relation Age of Onset   Renal Disease Mother    Hypertension Mother    High Cholesterol Mother    Heart attack  Mother    Diabetes Brother     Prior to Admission medications   Medication Sig Start Date End Date Taking? Authorizing Provider  albuterol (VENTOLIN HFA) 108 (90 Base) MCG/ACT inhaler Inhale 2 puffs into the lungs every 6 (six) hours as needed for wheezing or shortness of breath. 10/29/21  Yes Fenton Foy, NP  cholecalciferol (VITAMIN D3) 25 MCG (1000 UNIT) tablet Take 1,000 Units by mouth daily.   Yes [provider]  Cyanocobalamin (VITAMIN B-12 PO) Take 1 tablet by mouth daily.   Yes [provider]  Deferasirox 360 MG TABS Take 1,080 mg by mouth at bedtime. 04/23/21  Yes [provider]  diphenhydrAMINE (BENADRYL) 25 mg capsule Take 25 mg by mouth 3 (three) times daily as needed for itching.   Yes [provider]  fluticasone (FLONASE) 50 MCG/ACT nasal spray Place 1 spray into both nostrils daily as needed for allergies or rhinitis. 10/29/21 11/28/21 Yes Fenton Foy, NP  folic acid (FOLVITE) 1 MG tablet Take 1 mg by mouth at bedtime.   Yes [provider]  HYDROmorphone (DILAUDID) 4 MG tablet Take 1 tablet (4 mg total) by mouth every 4 (four) hours as needed for up to 15 days for severe pain. 10/31/21 11/15/21 Yes [name], MD  mirtazapine (  REMERON) 45 MG tablet Take 1 tablet (45 mg total) by mouth at bedtime. 10/29/21 10/29/22 Yes Fenton Foy, NP  mometasone-formoterol (DULERA) 100-5 MCG/ACT AERO Inhale 2 puffs into the lungs daily as needed for wheezing or shortness of breath. 10/29/21 10/29/22 Yes Fenton Foy, NP  naloxone [facility]) nasal spray 4 mg/0.1 mL Place 0.4 mg into the nose once as needed. 11/01/21  Yes [provider]  omeprazole (PRILOSEC) 20 MG capsule Take 1 capsule (20 mg total) by mouth 2 (two) times daily before a meal. Patient taking differently: Take 20 mg by mouth daily as needed (heartburn). 04/23/21 04/23/22 Yes King, Diona Foley, NP  polyethylene glycol (MIRALAX / GLYCOLAX) 17 g packet Take 17 g by  mouth daily as needed for mild constipation.   Yes [provider]  promethazine (PHENERGAN) 25 MG tablet Take 0.5-1 tablets (12.5-25 mg total) by mouth every 6 (six) hours as needed for nausea or vomiting. 10/31/21 10/31/22 Yes Fenton Foy, NP  voxelotor (OXBRYTA) 500 MG TABS tablet Take 1,500 mg by mouth at bedtime. 11/06/21 11/01/22 Yes [provider]  XARELTO 20 MG TABS tablet Take 1 tablet (20 mg total) by mouth daily with supper. 10/29/21 10/29/22 Yes Fenton Foy, NP    Physical Exam: Vitals:   11/11/21 2300 11/11/21 2330 11/12/21 0030 11/12/21 0100  BP: 113/86 125/90 115/84 105/77  Pulse: 95 91 93 94  Resp: '17 15 14 10  '$ Temp:      TempSrc:      SpO2: 96% 96% 94% 92%  Weight:      Height:       Constitutional: NAD, calm, uncomfortable Eyes: PERRL, lids and conjunctivae normal ENMT: Mucous membranes are moist. Posterior pharynx clear of any exudate or lesions.Normal dentition.  Neck: normal, supple, no masses, no thyromegaly Respiratory: clear to auscultation bilaterally, no wheezing, no crackles. Normal respiratory effort. No accessory muscle use.  Cardiovascular: Regular rate and rhythm, no murmurs / rubs / gallops. No extremity edema. 2+ pedal pulses. No carotid bruits.  Abdomen: no tenderness, no masses palpated. No hepatosplenomegaly. Bowel sounds positive.  Musculoskeletal: no clubbing / cyanosis. No joint deformity upper and lower extremities. Good ROM, no contractures. Normal muscle tone.  Skin: no rashes, lesions, ulcers. No induration Neurologic: CN 2-12 grossly intact. Sensation intact, DTR normal. Strength 5/5 in all 4.  Psychiatric: Normal judgment and insight. Alert and oriented x 3. Normal mood.   Data Reviewed:       Latest Ref Rng & Units 11/11/2021   10:43 PM 11/01/2021   11:06 AM 10/08/2021    8:00 AM  CBC  WBC 4.0 - 10.5 K/uL 15.9   16.2   14.8    Hemoglobin 12.0 - 15.0 g/dL 8.2   7.9   7.0    Hematocrit 36.0 - 46.0 % 23.9   22.5    20.5    Platelets 150 - 400 K/uL 470   638   645        Latest Ref Rng & Units 11/11/2021   10:43 PM 11/01/2021   11:06 AM 10/08/2021    8:00 AM  CMP  Glucose 70 - 99 mg/dL 92   105   93    BUN 6 - 20 mg/dL '9   10   8    '$ Creatinine 0.44 - 1.00 mg/dL 0.46   0.45   0.67    Sodium 135 - 145 mmol/L 139   136   136    Potassium  3.5 - 5.1 mmol/L 3.6   3.4   4.5    Chloride 98 - 111 mmol/L 111   108   104    CO2 22 - 32 mmol/L '22   22   27    '$ Calcium 8.9 - 10.3 mg/dL 9.1   9.0   8.7    Total Protein 6.5 - 8.1 g/dL 8.4   8.9     Total Bilirubin 0.3 - 1.2 mg/dL 4.1   3.6     Alkaline Phos 38 - 126 U/L 58   70     AST 15 - 41 U/L 28   27     ALT 0 - 44 U/L 12   11        Assessment and Plan: * Acute sickle cell crisis (HCC) Pt with sickle cell pain crisis. Sickle cell pathway Dilaudid PCA for pain Doesn't look like shes on any long acting opiate at home at this time (just short acting PO dilaudid) IVF: half NS at 125 Not ordering toradol as this is on her allergies list HGB 8.2 today which is actually better than her usual baseline of ~7 Cont home oxbryta Cont chelation therapy for iron overload with Desferasirox  Mild intermittent asthma without complication Continue home nebs  Hx of pulmonary embolism (HCC) Continue Xarelto  Gastro-esophageal reflux disease without esophagitis Continue home PPI  Hyperbilirubinemia Due to iron overload (see outside hospital heme/onc notes). Continue home chelation therapy with Desferasirox      Advance Care Planning:   Code Status: Full Code  Consults: None  Family Communication: No family in room  Severity of Illness: The appropriate patient status for this patient is INPATIENT. Inpatient status is judged to be reasonable and necessary in order to provide the required intensity of service to ensure the patient's safety. The patient's presenting symptoms, physical exam findings, and initial radiographic and laboratory data in the  context of their chronic comorbidities is felt to place them at high risk for further clinical deterioration. Furthermore, it is not anticipated that the patient will be medically stable for discharge from the hospital within 2 midnights of admission.   * I certify that at the point of admission it is my clinical judgment that the patient will require inpatient hospital care spanning beyond 2 midnights from the point of admission due to high intensity of service, high risk for further deterioration and high frequency of surveillance required.*  Author: Etta Quill., DO 11/12/2021 1:28 AM  For on call review [email or web address].

## 2021-11-12 NOTE — Assessment & Plan Note (Signed)
Pt with sickle cell pain crisis. 1. Sickle cell pathway 2. Dilaudid PCA for pain 3. Doesn't look like shes on any long acting opiate at home at this time (just short acting PO dilaudid) 4. IVF: half NS at 125 5. Not ordering toradol as this is on her allergies list 6. HGB 8.2 today which is actually better than her usual baseline of ~7 1. Cont home oxbryta 7. Cont chelation therapy for iron overload with Desferasirox

## 2021-11-12 NOTE — Assessment & Plan Note (Signed)
Continue Xarelto 

## 2021-11-12 NOTE — Progress Notes (Signed)
Patient was admitted early this morning with sickle cell pain crisis.  Her pain is at 8 out of 10 at the moment.  Has tried her home regimen over the weekend without significant help which is why she came to the ER.  She currently feels somewhat better but still pain is at 8 out of 10.  She is currently on Dilaudid PCA only.  She normally takes Dilaudid 4 mg every 4 hours as needed at home.  No long-acting pain medications.  Patient allergic to Toradol.  Diet has not been ordered yet.  We will likely resume her Dilaudid orally tomorrow once pain is controlled a little better.

## 2021-11-12 NOTE — ED Notes (Signed)
ED TO INPATIENT HANDOFF REPORT  ED Nurse Name and Phone #: [phone] Luz Brazen. RN  S Name/Age/Gender [facility] 41 y.o. female Room/Bed: WA04/WA04  Code Status   Code Status: Full Code  Home/SNF/Other Home Patient oriented to: self, place, time, and situation Is this baseline? Yes   Triage Complete: Triage complete  Chief Complaint Acute sickle cell crisis ([place]) [D57.00]  Triage Note Pt reports with [facility] x 2 days. Pt complains of pain in her back and left leg. Last Dilaudid 4 mg orally taken at 3 pm   Allergies Allergies  Allergen Reactions   Cefaclor Hives and Swelling   Hydroxyurea Palpitations and Other (See Comments)    Lowers "blood levels" and heart rate (causes HYPOtension); "it messes me up, it drops my levels and stuff"    Omeprazole Other (See Comments)    Causes "sharp pains in the stomach"   Ketamine Palpitations and Other (See Comments)    "Pt states she has had previous reaction to ketamine. States she becomes flushed, heart races, dizzy, and feels like she is going to pass out."   Tomato Itching and Rash    "Break out"    Level of Care/Admitting Diagnosis ED Disposition     ED Disposition  Admit   Condition  --   [place]: [facility] [[id number]]  Level of Care: Med-Surg [16]  May admit patient to [name] or [name] if equivalent level of care is available:: No  Covid Evaluation: Asymptomatic - no recent exposure (last 10 days) testing not required  Diagnosis: Acute sickle cell crisis [facility]) [[id number]]  Admitting Physician: Doreatha Massed  Attending Physician: Etta Quill [phone]  Estimated length of stay: past midnight tomorrow  Certification:: I certify this patient will need inpatient services for at least 2 midnights          B Medical/Surgery History Past Medical History:  Diagnosis Date   Asthma    Eczema    History of pulmonary embolus (PE)    Sickle cell anemia ([place])     Past Surgical History:  Procedure Laterality Date   CHOLECYSTECTOMY     ERCP     JOINT REPLACEMENT     PORTA CATH INSERTION     TUBAL LIGATION     WISDOM TOOTH EXTRACTION       A IV Location/Drains/Wounds Patient Lines/Drains/Airways Status     Active Line/Drains/Airways     Name Placement date Placement time Site Days   Implanted Port 01/06/20 01/06/20  0542  --  676   Implanted Port 10/05/21 Right Chest 10/05/21  1900  Chest  38            Intake/Output Last 24 hours No intake or output data in the 24 hours ending 11/12/21 0146  Labs/Imaging Results for orders placed or performed during the hospital encounter of 11/11/21 (from the past 48 hour(s))  CBC WITH DIFFERENTIAL     Status: Abnormal   Collection Time: 11/11/21 10:43 PM  Result Value Ref Range   WBC 15.9 (H) 4.0 - 10.5 K/uL   RBC 2.70 (L) 3.87 - 5.11 MIL/uL   Hemoglobin 8.2 (L) 12.0 - 15.0 g/dL   HCT 23.9 (L) 36.0 - 46.0 %   MCV 88.5 80.0 - 100.0 fL   MCH 30.4 26.0 - 34.0 pg   MCHC 34.3 30.0 - 36.0 g/dL   RDW 21.9 (H) 11.5 - 15.5 %   Platelets 470 (H) 150 - 400  K/uL   nRBC 0.3 (H) 0.0 - 0.2 %   Neutrophils Relative % 69 %   Neutro Abs 11.1 (H) 1.7 - 7.7 K/uL   Lymphocytes Relative 18 %   Lymphs Abs 2.9 0.7 - 4.0 K/uL   Monocytes Relative 11 %   Monocytes Absolute 1.8 (H) 0.1 - 1.0 K/uL   Eosinophils Relative 1 %   Eosinophils Absolute 0.1 0.0 - 0.5 K/uL   Basophils Relative 0 %   Basophils Absolute 0.1 0.0 - 0.1 K/uL   Immature Granulocytes 1 %   Abs Immature Granulocytes 0.08 (H) 0.00 - 0.07 K/uL   Pappenheimer Bodies PRESENT    Polychromasia PRESENT    Sickle Cells PRESENT    Target Cells PRESENT     Comment: Performed at [facility], [place] [address], [place], [place] [address]  Reticulocytes     Status: Abnormal   Collection Time: 11/11/21 10:43 PM  Result Value Ref Range   Retic Ct Pct 12.7 (H) 0.4 - 3.1 %   RBC. 2.68 (L) 3.87 - 5.11 MIL/uL   Retic Count, Absolute  368.5 (H) 19.0 - 186.0 K/uL    Comment: CONFIRMED BY MANUAL DILUTION   Immature Retic Fract 13.8 2.3 - 15.9 %    Comment: Performed at [facility], [place] [address] [place], [place] [address]  Comprehensive metabolic panel     Status: Abnormal   Collection Time: 11/11/21 10:43 PM  Result Value Ref Range   Sodium 139 135 - 145 mmol/L   Potassium 3.6 3.5 - 5.1 mmol/L   Chloride 111 98 - 111 mmol/L   CO2 22 22 - 32 mmol/L   Glucose, Bld 92 70 - 99 mg/dL    Comment: Glucose reference range applies only to samples taken after fasting for at least 8 hours.   BUN 9 6 - 20 mg/dL   Creatinine, Ser 0.46 0.44 - 1.00 mg/dL   Calcium 9.1 8.9 - 10.3 mg/dL   Total Protein 8.4 (H) 6.5 - 8.1 g/dL   Albumin 4.4 3.5 - 5.0 g/dL   AST 28 15 - 41 U/L   ALT 12 0 - 44 U/L   Alkaline Phosphatase 58 38 - 126 U/L   Total Bilirubin 4.1 (H) 0.3 - 1.2 mg/dL   GFR, Estimated >60 >60 mL/min    Comment: (NOTE) Calculated using the CKD-EPI Creatinine Equation (2021)    Anion gap 6 5 - 15    Comment: Performed at [facility], [place] [address], [place], [place] [address]  Type and screen [place]     Status: None   Collection Time: 11/11/21 10:43 PM  Result Value Ref Range   ABO/RH(D) A POS    Antibody Screen NEG    Sample Expiration      11/14/2021,2359 Performed at [facility] [address] [place], [place] [address]    No results found.  Pending Labs Unresulted Labs (From admission, onward)     Start     Ordered   11/12/21 0121  HIV Antibody (routine testing w rflx)  (HIV Antibody (Routine testing w reflex) panel)  Once,   R        11/12/21 0121            Vitals/Pain Today's Vitals   11/11/21 2330 11/12/21 0030 11/12/21 0100 11/12/21 0133  BP: 125/90 115/84 105/77   Pulse: 91 93 94   Resp: '15 14 10   '$ Temp:  TempSrc:      SpO2: 96% 94% 92%   Weight:      Height:      PainSc:    8     Isolation  Precautions No active isolations  Medications Medications  0.45 % sodium chloride infusion ( Intravenous New Bag/Given 11/11/21 2309)  HYDROmorphone (DILAUDID) injection 2 mg (2 mg Intravenous Given 11/11/21 2344)  promethazine (PHENERGAN) tablet 25 mg (25 mg Oral Given 11/11/21 2307)  rivaroxaban (XARELTO) tablet 20 mg (has no administration in time range)  voxelotor (OXBRYTA) tablet 1,500 mg (has no administration in time range)  polyethylene glycol (MIRALAX / GLYCOLAX) packet 17 g (has no administration in time range)  mometasone-formoterol (DULERA) 100-5 MCG/ACT inhaler 2 puff (has no administration in time range)  albuterol (PROVENTIL) (2.5 MG/3ML) 0.083% nebulizer solution 3 mL (has no administration in time range)  Deferasirox TABS 1,080 mg (has no administration in time range)  pantoprazole (PROTONIX) EC tablet 40 mg (has no administration in time range)  folic acid (FOLVITE) tablet 1 mg (has no administration in time range)  fluticasone (FLONASE) 50 MCG/ACT nasal spray 1 spray (has no administration in time range)  mirtazapine (REMERON) tablet 45 mg (has no administration in time range)  senna-docusate (Senokot-S) tablet 1 tablet (has no administration in time range)  naloxone (NARCAN) injection 0.4 mg (has no administration in time range)    And  sodium chloride flush (NS) 0.9 % injection 9 mL (has no administration in time range)  ondansetron (ZOFRAN) injection 4 mg (has no administration in time range)  diphenhydrAMINE (BENADRYL) capsule 25 mg (has no administration in time range)  HYDROmorphone (DILAUDID) 1 mg/mL PCA injection (has no administration in time range)  HYDROmorphone (DILAUDID) injection 2 mg (2 mg Intravenous Given 11/11/21 2306)  HYDROmorphone (DILAUDID) injection 2 mg (2 mg Intravenous Given 11/12/21 0035)  diphenhydrAMINE (BENADRYL) 12.5 mg in sodium chloride 0.9 % 50 mL IVPB (12.5 mg Intravenous New Bag/Given 11/11/21 2305)  diphenhydrAMINE (BENADRYL) capsule 25 mg  (25 mg Oral Given 11/12/21 0035)  promethazine (PHENERGAN) tablet 25 mg (25 mg Oral Given 11/12/21 0047)    Mobility walks Low fall risk   Focused Assessments    R Recommendations: See Admitting Provider Note  Report given to:   Additional Notes:

## 2021-11-12 NOTE — Assessment & Plan Note (Signed)
-   Continue home PPI °

## 2021-11-12 NOTE — Plan of Care (Signed)
POC initiated 

## 2021-11-13 DIAGNOSIS — D5700 Hb-SS disease with crisis, unspecified: Secondary | ICD-10-CM

## 2021-11-13 LAB — CBC
HCT: 18.3 % — ABNORMAL LOW (ref 36.0–46.0)
Hemoglobin: 6.7 g/dL — CL (ref 12.0–15.0)
MCH: 31 pg (ref 26.0–34.0)
MCHC: 36.6 g/dL — ABNORMAL HIGH (ref 30.0–36.0)
MCV: 84.7 fL (ref 80.0–100.0)
Platelets: 388 10*3/uL (ref 150–400)
RBC: 2.16 MIL/uL — ABNORMAL LOW (ref 3.87–5.11)
RDW: 22.5 % — ABNORMAL HIGH (ref 11.5–15.5)
WBC: 15.4 10*3/uL — ABNORMAL HIGH (ref 4.0–10.5)
nRBC: 0.3 % — ABNORMAL HIGH (ref 0.0–0.2)

## 2021-11-13 LAB — HIV ANTIBODY (ROUTINE TESTING W REFLEX): HIV Screen 4th Generation wRfx: NONREACTIVE

## 2021-11-13 MED ORDER — CHLORHEXIDINE GLUCONATE CLOTH 2 % EX PADS
6.0000 | MEDICATED_PAD | Freq: Every day | CUTANEOUS | Status: DC
Start: 2021-11-13 — End: 2021-11-15
  Administered 2021-11-13 – 2021-11-15 (×3): 6 via TOPICAL

## 2021-11-13 NOTE — Progress Notes (Signed)
Date and time results received: 11/13/21 1042 (use smartphrase ".now" to insert current time)  Test: CBC Critical Value: Hgb 6.7  Name of Provider Notified: Cammie Sickle, NP  Orders Received? Or Actions Taken?: No action needed at this time. Provider will place new orders today.

## 2021-11-13 NOTE — Progress Notes (Addendum)
Subjective: Molly Gutierrez is a 41 year old female with a medical history significant for sickle cell disease, chronic pain syndrome, opiate dependence and tolerance, history of PE, and history of anemia of chronic disease was admitted for sickle cell pain crisis.  Today, patient is complaining of significant pain primarily to her chest and lower extremities.  She rates her pain as 8/10.  She states that pain has not improved very much overnight. She denies shortness of breath, headache, dizziness, urinary symptoms, nausea, vomiting, or diarrhea.  Objective:  Vital signs in last 24 hours:  Vitals:   11/13/21 1123 11/13/21 1332 11/13/21 1344 11/13/21 1444  BP: 115/75   97/67  Pulse: (!) 102   100  Resp: '15 13 15 14  '$ Temp: 97.8 F (36.6 C)   98.1 F (36.7 C)  TempSrc: Oral   Oral  SpO2: 93% 92%  92%  Weight:      Height:        Intake/Output from previous day:   Intake/Output Summary (Last 24 hours) at 11/13/2021 1724 Last data filed at 11/13/2021 1549 Gross per 24 hour  Intake 2805.6 ml  Output --  Net 2805.6 ml    Physical Exam: General: Alert, awake, oriented x3, in no acute distress.  HEENT: [place]/AT PEERL, EOMI Neck: Trachea midline,  no masses, no thyromegal,y no JVD, no carotid bruit OROPHARYNX:  Moist, No exudate/ erythema/lesions.  Heart: Regular rate and rhythm, without murmurs, rubs, gallops, PMI non-displaced, no heaves or thrills on palpation.  Lungs: Clear to auscultation, no wheezing or rhonchi noted. No increased vocal fremitus resonant to percussion  Abdomen: Soft, nontender, nondistended, positive bowel sounds, no masses no hepatosplenomegaly noted..  Neuro: No focal neurological deficits noted cranial nerves II through XII grossly intact. DTRs 2+ bilaterally upper and lower extremities. Strength 5 out of 5 in bilateral upper and lower extremities. Musculoskeletal: No warm swelling or erythema around joints, no spinal tenderness noted. Psychiatric:  Patient alert and oriented x3, good insight and cognition, good recent to remote recall. Lymph node survey: No cervical axillary or inguinal lymphadenopathy noted.  Lab Results:  Basic Metabolic Panel:    Component Value Date/Time   NA 139 11/11/2021 2243   NA 137 08/02/2020 1538   K 3.6 11/11/2021 2243   CL 111 11/11/2021 2243   CO2 22 11/11/2021 2243   BUN 9 11/11/2021 2243   BUN 7 08/02/2020 1538   CREATININE 0.46 11/11/2021 2243   GLUCOSE 92 11/11/2021 2243   CALCIUM 9.1 11/11/2021 2243   CBC:    Component Value Date/Time   WBC 15.4 (H) 11/13/2021 0800   HGB 6.7 (LL) 11/13/2021 0800   HGB 7.8 (L) 08/02/2020 1538   HCT 18.3 (L) 11/13/2021 0800   HCT 22.3 (L) 08/02/2020 1538   PLT 388 11/13/2021 0800   PLT 560 (H) 08/02/2020 1538   MCV 84.7 11/13/2021 0800   MCV 92 08/02/2020 1538   NEUTROABS 11.1 (H) 11/11/2021 2243   NEUTROABS 9.5 (H) 08/02/2020 1538   LYMPHSABS 2.9 11/11/2021 2243   LYMPHSABS 3.0 08/02/2020 1538   MONOABS 1.8 (H) 11/11/2021 2243   EOSABS 0.1 11/11/2021 2243   EOSABS 0.4 08/02/2020 1538   BASOSABS 0.1 11/11/2021 2243   BASOSABS 0.1 08/02/2020 1538    No results found for this or any previous visit (from the past 240 hour(s)).  Studies/Results: No results found.  Medications: Scheduled Meds:  Chlorhexidine Gluconate Cloth  6 each Topical Daily   Deferasirox  1,080 mg Oral QHS  folic acid  1 mg Oral Daily   HYDROmorphone   Intravenous Q4H   mirtazapine  45 mg Oral QHS   mometasone-formoterol  2 puff Inhalation BID   pantoprazole  40 mg Oral BID AC   rivaroxaban  20 mg Oral Q supper   senna-docusate  1 tablet Oral BID   voxelotor  1,500 mg Oral QHS   Continuous Infusions:  sodium chloride 10 mL/hr at 11/13/21 1549   PRN Meds:.albuterol, diphenhydrAMINE, fluticasone, naloxone **AND** sodium chloride flush, ondansetron (ZOFRAN) IV, polyethylene glycol,  promethazine  Consultants: None  Procedures: None  Antibiotics: None  Assessment/Plan: Principal Problem:   Acute sickle cell crisis ([place]) Active Problems:   History of pulmonary embolism   Gastro-esophageal reflux disease without esophagitis   Hyperbilirubinemia   Mild intermittent asthma without complication  Sickle cell disease with pain crisis: Continue IV Dilaudid PCA without changes, will start weaning on tomorrow if pain intensity improves overnight Toradol 15 mg IV every 6 hours Decrease IV fluids to [facility] Monitor vital signs very closely, reevaluate pain scale regularly, and supplemental oxygen as needed.  Anemia of chronic disease: Today, patient's hemoglobin is 6.7 g/dL, which is below her baseline.  More than likely hemodilution.  Decrease IV fluids to KVO.  Repeat CBC in AM.  Continue folic acid and Oxbryta.  History of DVT: Continue Xarelto  Chronic pain syndrome: Will restart patient's home medications on tomorrow if pain intensity improves overnight  History of mild intermittent asthma: Continue home medications as needed  History of GERD: Continue home PPI  Hyperbilirubinemia: Secondary to sickle cell disease.  Labs in AM.  Continue  Desferasirox   Code Status: Full Code Family Communication: N/A Disposition Plan: Not yet ready for discharge  [place], MSN, FNP-C Patient [place], [place] [id number] [phone]  If 7PM-7AM, please contact night-coverage.  11/13/2021, 5:24 PM  LOS: 1 day

## 2021-11-14 DIAGNOSIS — D5700 Hb-SS disease with crisis, unspecified: Secondary | ICD-10-CM | POA: Diagnosis not present

## 2021-11-14 LAB — BASIC METABOLIC PANEL
Anion gap: 5 (ref 5–15)
BUN: 10 mg/dL (ref 6–20)
CO2: 25 mmol/L (ref 22–32)
Calcium: 8.8 mg/dL — ABNORMAL LOW (ref 8.9–10.3)
Chloride: 109 mmol/L (ref 98–111)
Creatinine, Ser: 0.64 mg/dL (ref 0.44–1.00)
GFR, Estimated: >60 mL/min (ref >60)
Glucose, Bld: 123 mg/dL — ABNORMAL HIGH (ref 70–99)
Potassium: 3.9 mmol/L (ref 3.5–5.1)
Sodium: 139 mmol/L (ref 135–145)

## 2021-11-14 LAB — CBC
HCT: 19 % — ABNORMAL LOW (ref 36.0–46.0)
Hemoglobin: 6.8 g/dL — CL (ref 12.0–15.0)
MCH: 30.9 pg (ref 26.0–34.0)
MCHC: 35.8 g/dL (ref 30.0–36.0)
MCV: 86.4 fL (ref 80.0–100.0)
Platelets: 422 10*3/uL — ABNORMAL HIGH (ref 150–400)
RBC: 2.2 MIL/uL — ABNORMAL LOW (ref 3.87–5.11)
RDW: 23 % — ABNORMAL HIGH (ref 11.5–15.5)
WBC: 15.6 10*3/uL — ABNORMAL HIGH (ref 4.0–10.5)
nRBC: 0.5 % — ABNORMAL HIGH (ref 0.0–0.2)

## 2021-11-14 LAB — PREPARE RBC (CROSSMATCH)

## 2021-11-14 MED ORDER — ACETAMINOPHEN 325 MG PO TABS
650.0000 mg | ORAL_TABLET | Freq: Once | ORAL | Status: AC
  Administered 2021-11-15: 650 mg via ORAL
  Filled 2021-11-14: qty 2

## 2021-11-14 MED ORDER — DIPHENHYDRAMINE HCL 50 MG/ML IJ SOLN
25.0000 mg | Freq: Once | INTRAMUSCULAR | Status: AC
  Administered 2021-11-14: 25 mg via INTRAVENOUS
  Filled 2021-11-14: qty 1

## 2021-11-14 MED ORDER — SODIUM CHLORIDE 0.9% IV SOLUTION: Freq: Once | INTRAVENOUS | Status: AC

## 2021-11-14 MED ORDER — HYDROMORPHONE HCL 4 MG PO TABS
4.0000 mg | ORAL_TABLET | ORAL | Status: DC | PRN
  Administered 2021-11-14 – 2021-11-15 (×3): 4 mg via ORAL
  Filled 2021-11-14 (×3): qty 1

## 2021-11-14 MED ORDER — HYDROMORPHONE 1 MG/ML IV SOLN
INTRAVENOUS | Status: DC
  Administered 2021-11-14: 2.5 mg via INTRAVENOUS
  Administered 2021-11-14: 1 mg via INTRAVENOUS
  Administered 2021-11-15: 0.5 mg via INTRAVENOUS
  Administered 2021-11-15: 4.5 mg via INTRAVENOUS
  Administered 2021-11-15: 3 mg via INTRAVENOUS
  Administered 2021-11-15: 3.5 mg via INTRAVENOUS
  Administered 2021-11-15: 6 mg via INTRAVENOUS
  Filled 2021-11-14: qty 30

## 2021-11-14 NOTE — Progress Notes (Signed)
Date and time results received: 11/14/21 1530   Test: CBC Critical Value: 6.8  Name of Provider Notified: Cammie Sickle, FNP  Orders Received? Or Actions Taken?:  New orders for blood transfusion pending.

## 2021-11-14 NOTE — Progress Notes (Signed)
Subjective: Molly Gutierrez is a 41 year old female with a medical history significant for sickle cell disease, chronic pain syndrome, opiate dependence and tolerance, history of PE, and history of anemia of chronic disease was admitted for sickle cell pain crisis.  Today, patient states that pain intensity has improved some overnight.  She rates her pain as 7/10. Today, patient's hemoglobin is 6.6 g/dL.  Baseline is 7-8 g/dL.  She denies shortness of breath, headache, dizziness, urinary symptoms, nausea, vomiting, or diarrhea.  Objective:  Vital signs in last 24 hours:  Vitals:   11/14/21 0747 11/14/21 0957 11/14/21 1238 11/14/21 1500  BP:  100/72  98/65  Pulse:  81  90  Resp: '14 14 14 15  '$ Temp:  98.7 F (37.1 C)  98.7 F (37.1 C)  TempSrc:  Oral  Oral  SpO2: 92% 95% 94% 95%  Weight:      Height:        Intake/Output from previous day:   Intake/Output Summary (Last 24 hours) at 11/14/2021 1623 Last data filed at 11/14/2021 7026 Gross per 24 hour  Intake 240 ml  Output --  Net 240 ml    Physical Exam: General: Alert, awake, oriented x3, in no acute distress.  HEENT: [place]/AT PEERL, EOMI Neck: Trachea midline,  no masses, no thyromegal,y no JVD, no carotid bruit OROPHARYNX:  Moist, No exudate/ erythema/lesions.  Heart: Regular rate and rhythm, without murmurs, rubs, gallops, PMI non-displaced, no heaves or thrills on palpation.  Lungs: Clear to auscultation, no wheezing or rhonchi noted. No increased vocal fremitus resonant to percussion  Abdomen: Soft, nontender, nondistended, positive bowel sounds, no masses no hepatosplenomegaly noted..  Neuro: No focal neurological deficits noted cranial nerves II through XII grossly intact. DTRs 2+ bilaterally upper and lower extremities. Strength 5 out of 5 in bilateral upper and lower extremities. Musculoskeletal: No warm swelling or erythema around joints, no spinal tenderness noted. Psychiatric: Patient alert and oriented x3, good  insight and cognition, good recent to remote recall. Lymph node survey: No cervical axillary or inguinal lymphadenopathy noted.  Lab Results:  Basic Metabolic Panel:    Component Value Date/Time   NA 139 11/14/2021 1200   NA 137 08/02/2020 1538   K 3.9 11/14/2021 1200   CL 109 11/14/2021 1200   CO2 25 11/14/2021 1200   BUN 10 11/14/2021 1200   BUN 7 08/02/2020 1538   CREATININE 0.64 11/14/2021 1200   GLUCOSE 123 (H) 11/14/2021 1200   CALCIUM 8.8 (L) 11/14/2021 1200   CBC:    Component Value Date/Time   WBC 15.6 (H) 11/14/2021 1200   HGB 6.8 (LL) 11/14/2021 1200   HGB 7.8 (L) 08/02/2020 1538   HCT 19.0 (L) 11/14/2021 1200   HCT 22.3 (L) 08/02/2020 1538   PLT 422 (H) 11/14/2021 1200   PLT 560 (H) 08/02/2020 1538   MCV 86.4 11/14/2021 1200   MCV 92 08/02/2020 1538   NEUTROABS 11.1 (H) 11/11/2021 2243   NEUTROABS 9.5 (H) 08/02/2020 1538   LYMPHSABS 2.9 11/11/2021 2243   LYMPHSABS 3.0 08/02/2020 1538   MONOABS 1.8 (H) 11/11/2021 2243   EOSABS 0.1 11/11/2021 2243   EOSABS 0.4 08/02/2020 1538   BASOSABS 0.1 11/11/2021 2243   BASOSABS 0.1 08/02/2020 1538    No results found for this or any previous visit (from the past 240 hour(s)).  Studies/Results: No results found.  Medications: Scheduled Meds:  sodium chloride   Intravenous Once   acetaminophen  650 mg Oral Once   Chlorhexidine Gluconate Cloth  6 each Topical Daily   Deferasirox  1,080 mg Oral QHS   diphenhydrAMINE  25 mg Intravenous Once   folic acid  1 mg Oral Daily   HYDROmorphone   Intravenous Q4H   mirtazapine  45 mg Oral QHS   mometasone-formoterol  2 puff Inhalation BID   pantoprazole  40 mg Oral BID AC   rivaroxaban  20 mg Oral Q supper   senna-docusate  1 tablet Oral BID   voxelotor  1,500 mg Oral QHS   Continuous Infusions:  sodium chloride 10 mL/hr at 11/13/21 1549   PRN Meds:.albuterol, diphenhydrAMINE, fluticasone, HYDROmorphone, naloxone **AND** sodium chloride flush, ondansetron (ZOFRAN)  IV, polyethylene glycol, promethazine  Consultants: None  Procedures: None  Antibiotics: None  Assessment/Plan: Principal Problem:   Acute sickle cell crisis ([place]) Active Problems:   History of pulmonary embolism   Gastro-esophageal reflux disease without esophagitis   Hyperbilirubinemia   Mild intermittent asthma without complication  Sickle cell disease with pain crisis: Weaning IV Dilaudid PCA Home medications restarted, Dilaudid 4 mg by mouth every 4 hours as needed for severe breakthrough pain Toradol 15 mg IV every 6 hours Decrease IV fluids to [facility] Monitor vital signs very closely, reevaluate pain scale regularly, and supplemental oxygen as needed.  Anemia of chronic disease: Today, patient's hemoglobin is 6.7 g/dL, will transfuse 1 unit PRBCs today.  Follow CBC in AM.  D  History of DVT: Continue Xarelto  Chronic pain syndrome: Restarted home medications   History of mild intermittent asthma: Continue home medications as needed  History of GERD: Continue home PPI  Hyperbilirubinemia: Secondary to sickle cell disease.  Labs in AM.  Continue  Desferasirox   Code Status: Full Code Family Communication: N/A Disposition Plan: Not yet ready for discharge  [place], MSN, FNP-C Patient [place], [place] [address] [phone]  If 7PM-7AM, please contact night-coverage.  11/14/2021, 4:23 PM  LOS: 2 days

## 2021-11-15 ENCOUNTER — Other Ambulatory Visit: Payer: Self-pay | Admitting: Nurse Practitioner

## 2021-11-15 DIAGNOSIS — D5700 Hb-SS disease with crisis, unspecified: Secondary | ICD-10-CM

## 2021-11-15 LAB — TYPE AND SCREEN
ABO/RH(D): A POS
Antibody Screen: NEGATIVE
Unit division: 0

## 2021-11-15 LAB — CBC
HCT: 24.4 % — ABNORMAL LOW (ref 36.0–46.0)
Hemoglobin: 8.7 g/dL — ABNORMAL LOW (ref 12.0–15.0)
MCH: 31.4 pg (ref 26.0–34.0)
MCHC: 35.7 g/dL (ref 30.0–36.0)
MCV: 88.1 fL (ref 80.0–100.0)
Platelets: 463 10*3/uL — ABNORMAL HIGH (ref 150–400)
RBC: 2.77 MIL/uL — ABNORMAL LOW (ref 3.87–5.11)
RDW: 21.5 % — ABNORMAL HIGH (ref 11.5–15.5)
WBC: 16.1 10*3/uL — ABNORMAL HIGH (ref 4.0–10.5)
nRBC: 0.4 % — ABNORMAL HIGH (ref 0.0–0.2)

## 2021-11-15 LAB — BPAM RBC
Blood Product Expiration Date: 202307062359
ISSUE DATE / TIME: 202306010118
Unit Type and Rh: 5100

## 2021-11-15 MED ORDER — HEPARIN SOD (PORK) LOCK FLUSH 100 UNIT/ML IV SOLN
500.0000 [IU] | Freq: Once | INTRAVENOUS | Status: AC
  Administered 2021-11-15: 500 [IU] via INTRAVENOUS
  Filled 2021-11-15: qty 5

## 2021-11-15 MED ORDER — HYDROMORPHONE HCL 4 MG PO TABS: 4.0000 mg | ORAL_TABLET | ORAL | 0 refills | Status: AC | PRN

## 2021-11-15 NOTE — Discharge Summary (Signed)
Physician Discharge Summary  [facility] DGL:[id number] DOB: 11-03-1980 DOA: 11/11/2021  PCP: Fenton Foy, NP  Admit date: 11/11/2021  Discharge date: 11/15/2021  Discharge Diagnoses:  Principal Problem:   Acute sickle cell crisis ([place]) Active Problems:   History of pulmonary embolism   Gastro-esophageal reflux disease without esophagitis   Hyperbilirubinemia   Mild intermittent asthma without complication   Discharge Condition: Stable  Disposition:   Follow-up Information     Fenton Foy, NP Follow up in 2 week(s).   Specialty: Pulmonary Disease Contact information: [address], [place] [address] [phone]                Pt is discharged home in good condition and is to follow up with Fenton Foy, NP this week to have labs evaluated. Molly Gutierrez is instructed to increase activity slowly and balance with rest for the next few days, and use prescribed medication to complete treatment of pain  Diet: Regular Wt Readings from Last 3 Encounters:  11/11/21 49.9 kg  11/01/21 48.5 kg  10/29/21 48.7 kg    History of present illness:  Molly Gutierrez is a 41 year old female with a medical history significant for sickle cell disease type SS, chronic pain syndrome, opiate dependence and tolerance, frequent admissions for pain crisis, history of iron overload, mild intermittent asthma, anemia of chronic disease, and history of PE on Xarelto presented to the ER with complaints of 3-day history of lower extremity and back pain.  Pain is characterized as waxing and waning, typical of her sickle cell pain symptoms.  Pain is persistent despite home pain medications.  Patient denies any chest pain, fever, cough, nausea, or vomiting.  Hospital Course:  Sickle cell disease with pain crisis: Patient was admitted for sickle cell pain crisis and managed appropriately with IVF, IV Dilaudid via PCA and IV Toradol, as well as other adjunct  therapies per sickle cell pain management protocols.  Pain intensity is 5/10 and patient is requesting discharge home.  Advised to follow-up with PCP for medication management.  Anemia of chronic disease: Hemoglobin decreased to 6.7 g/dL, which is below patient's baseline.  Transfused 1 unit PRBCs.  Prior to discharge, hemoglobin returned to baseline at 8.7 g/dL.  Patient will follow-up with PCP for CBC with differential and CMP.  Patient is alert, oriented, and ambulating without assistance.  Patient was therefore discharged home today in a hemodynamically stable condition.   Molly Gutierrez will follow-up with PCP within 1 week of this discharge. Molly Gutierrez was counseled extensively about nonpharmacologic means of pain management, patient verbalized understanding and was appreciative of  the care received during this admission.   We discussed the need for good hydration, monitoring of hydration status, avoidance of heat, cold, stress, and infection triggers. We discussed the need to be adherent with taking  home medications. Patient was reminded of the need to seek medical attention immediately if any symptom of bleeding, anemia, or infection occurs.  Discharge Exam: Vitals:   11/15/21 0744 11/15/21 1150  BP:    Pulse:    Resp: 16 14  Temp:    SpO2: 96% 94%   Vitals:   11/15/21 0343 11/15/21 0357 11/15/21 0744 11/15/21 1150  BP:  116/79    Pulse:  80    Resp: 16 15 16 14   Temp:  98.6 F (37 C)    TempSrc:  Oral    SpO2: 97% 97% 96% 94%  Weight:      Height:  General appearance : Awake, alert, not in any distress. Speech Clear. Not toxic looking HEENT: Atraumatic and Normocephalic, pupils equally reactive to light and accomodation Neck: Supple, no JVD. No cervical lymphadenopathy.  Chest: Good air entry bilaterally, no added sounds  CVS: S1 S2 regular, no murmurs.  Abdomen: Bowel sounds present, Non tender and not distended with no gaurding, rigidity or rebound. Extremities:  B/L Lower Ext shows no edema, both legs are warm to touch Neurology: Awake alert, and oriented X 3, CN II-XII intact, Non focal Skin: No Rash  Discharge Instructions  Discharge Instructions     Discharge patient   Complete by: As directed    Discharge disposition: 01-Home or Self Care   Discharge patient date: 11/15/2021      Allergies as of 11/15/2021       Reactions   Cefaclor Hives, Swelling   Hydroxyurea Palpitations, Other (See Comments)   Lowers "blood levels" and heart rate (causes HYPOtension); "it messes me up, it drops my levels and stuff"   Omeprazole Other (See Comments)   Causes "sharp pains in the stomach"   Ketamine Palpitations, Other (See Comments)   "Pt states she has had previous reaction to ketamine. States she becomes flushed, heart races, dizzy, and feels like she is going to pass out."   Tomato Itching, Rash   "Break out"        Medication List     TAKE these medications    albuterol 108 (90 Base) MCG/ACT inhaler Commonly known as: VENTOLIN HFA Inhale 2 puffs into the lungs every 6 (six) hours as needed for wheezing or shortness of breath.   cholecalciferol 25 MCG (1000 UNIT) tablet Commonly known as: VITAMIN D3 Take 1,000 Units by mouth daily.   Deferasirox 360 MG Tabs Take 1,080 mg by mouth at bedtime.   diphenhydrAMINE 25 mg capsule Commonly known as: BENADRYL Take 25 mg by mouth 3 (three) times daily as needed for itching.   fluticasone 50 MCG/ACT nasal spray Commonly known as: FLONASE Place 1 spray into both nostrils daily as needed for allergies or rhinitis.   folic acid 1 MG tablet Commonly known as: FOLVITE Take 1 mg by mouth at bedtime.   HYDROmorphone 4 MG tablet Commonly known as: DILAUDID Take 1 tablet (4 mg total) by mouth every 4 (four) hours as needed for up to 15 days for severe pain.   mirtazapine 45 MG tablet Commonly known as: REMERON Take 1 tablet (45 mg total) by mouth at bedtime.   mometasone-formoterol 100-5  MCG/ACT Aero Commonly known as: DULERA Inhale 2 puffs into the lungs daily as needed for wheezing or shortness of breath.   naloxone 4 MG/0.1ML Liqd nasal spray kit Commonly known as: NARCAN Place 0.4 mg into the nose once as needed.   omeprazole 20 MG capsule Commonly known as: PRILOSEC Take 1 capsule (20 mg total) by mouth 2 (two) times daily before a meal. What changed:  when to take this reasons to take this   Oxbryta 500 MG Tabs tablet Generic drug: voxelotor Take 1,500 mg by mouth at bedtime.   polyethylene glycol 17 g packet Commonly known as: MIRALAX / GLYCOLAX Take 17 g by mouth daily as needed for mild constipation.   promethazine 25 MG tablet Commonly known as: PHENERGAN Take 0.5-1 tablets (12.5-25 mg total) by mouth every 6 (six) hours as needed for nausea or vomiting.   VITAMIN B-12 PO Take 1 tablet by mouth daily.   Xarelto 20 MG Tabs tablet Generic  drug: rivaroxaban Take 1 tablet (20 mg total) by mouth daily with supper.        The results of significant diagnostics from this hospitalization (including imaging, microbiology, ancillary and laboratory) are listed below for reference.    Significant Diagnostic Studies: DG Chest Port 1 View  Result Date: 11/01/2021 CLINICAL DATA:  Chest pain, sickle cell crisis EXAM: PORTABLE CHEST 1 VIEW COMPARISON:  10/04/2021 FINDINGS: Unchanged position of the bilateral chest ports and nerve catheters. Cardiac and mediastinal contours are within normal limits. No focal pulmonary opacity. No pleural effusion or pneumothorax. No acute osseous abnormality. IMPRESSION: No acute cardiopulmonary process. Electronically Signed   By: Merilyn Baba M.D.   On: 11/01/2021 12:16    Microbiology: No results found for this or any previous visit (from the past 240 hour(s)).   Labs: Basic Metabolic Panel: Recent Labs  Lab 11/11/21 2243 11/14/21 1200  NA 139 139  K 3.6 3.9  CL 111 109  CO2 22 25  GLUCOSE 92 123*  BUN 9 10   CREATININE 0.46 0.64  CALCIUM 9.1 8.8*   Liver Function Tests: Recent Labs  Lab 11/11/21 2243  AST 28  ALT 12  ALKPHOS 58  BILITOT 4.1*  PROT 8.4*  ALBUMIN 4.4   No results for input(s): LIPASE, AMYLASE in the last 168 hours. No results for input(s): AMMONIA in the last 168 hours. CBC: Recent Labs  Lab 11/11/21 2243 11/13/21 0800 11/14/21 1200 11/15/21 0937  WBC 15.9* 15.4* 15.6* 16.1*  NEUTROABS 11.1*  --   --   --   HGB 8.2* 6.7* 6.8* 8.7*  HCT 23.9* 18.3* 19.0* 24.4*  MCV 88.5 84.7 86.4 88.1  PLT 470* 388 422* 463*   Cardiac Enzymes: No results for input(s): CKTOTAL, CKMB, CKMBINDEX, TROPONINI in the last 168 hours. BNP: Invalid input(s): POCBNP CBG: No results for input(s): GLUCAP in the last 168 hours.  Time coordinating discharge: 50 minutes  Signed:  [name]  APRN, MSN, FNP-C Patient [place], [place] [address] [phone]  [facility] 11/15/2021, 1:20 PM

## 2021-11-15 NOTE — Telephone Encounter (Signed)
Medication was not due for refill until today. I sent the prescription to the pharmacy.

## 2021-11-15 NOTE — Progress Notes (Signed)
Patient discharged to home with family, discharge instructions reviewed with patient who verbalized understanding. 

## 2021-11-15 NOTE — Care Management Important Message (Signed)
Important Message  Patient Details IM Letter given to the Patient. Name: Molly Gutierrez MRN: [id number] Date of Birth: 1980/11/22   Medicare Important Message Given:  Yes     Kerin Salen 11/15/2021, 9:39 AM

## 2021-11-16 LAB — BPAM RBC
Blood Product Expiration Date: 202307062359
ISSUE DATE / TIME: 202306010118
Unit Type and Rh: 5100

## 2021-11-16 LAB — TYPE AND SCREEN
ABO/RH(D): A POS
Antibody Screen: NEGATIVE
Unit division: 0

## 2021-11-19 ENCOUNTER — Telehealth: Payer: Self-pay | Admitting: *Deleted

## 2021-11-19 NOTE — Telephone Encounter (Signed)
Transition Care Management Unsuccessful Follow-up Telephone Call  Date of discharge and from where:  11/15/21 [facility]  Attempts:  1st Attempt  Reason for unsuccessful TCM follow-up call:  Left voice message  Kelli Churn RN, CCM, [place] Management Coordinator  [phone]

## 2021-11-20 ENCOUNTER — Other Ambulatory Visit: Payer: Self-pay | Admitting: Nurse Practitioner

## 2021-11-20 DIAGNOSIS — D5700 Hb-SS disease with crisis, unspecified: Secondary | ICD-10-CM

## 2021-11-29 ENCOUNTER — Telehealth: Payer: Self-pay

## 2021-11-29 NOTE — Telephone Encounter (Signed)
Hydromorphone Morphine   [facility]  [place] [address]

## 2021-12-03 ENCOUNTER — Other Ambulatory Visit: Payer: Self-pay | Admitting: Nurse Practitioner

## 2021-12-03 ENCOUNTER — Other Ambulatory Visit: Payer: Self-pay | Admitting: Family Medicine

## 2021-12-03 ENCOUNTER — Telehealth: Payer: Self-pay

## 2021-12-03 DIAGNOSIS — D5700 Hb-SS disease with crisis, unspecified: Secondary | ICD-10-CM

## 2021-12-03 MED ORDER — HYDROMORPHONE HCL 4 MG PO TABS: 4.0000 mg | ORAL_TABLET | Freq: Four times a day (QID) | ORAL | 0 refills | Status: DC | PRN

## 2021-12-03 NOTE — Progress Notes (Signed)
Reviewed PDMP substance reporting system prior to prescribing opiate medications. No inconsistencies noted.  Meds ordered this encounter  Medications   DISCONTD: HYDROmorphone (DILAUDID) 4 MG tablet    Sig: Take 1 tablet (4 mg total) by mouth every 6 (six) hours as needed for up to 15 days for severe pain.    Dispense:  60 tablet    Refill:  0    Order Specific Question:   Supervising Provider    Answer:   [name] [[id number]]   HYDROmorphone (DILAUDID) 4 MG tablet    Sig: Take 1 tablet (4 mg total) by mouth every 6 (six) hours as needed for up to 15 days for severe pain.    Dispense:  60 tablet    Refill:  0    Order Specific Question:   Supervising Provider    Answer:   [name] [[id number]]     [name]  APRN, MSN, FNP-C Patient [place] [address] [place], [place] [address] [phone]

## 2021-12-03 NOTE — Telephone Encounter (Signed)
[facility]  [address] [place]    Hydromorphone Morphine

## 2021-12-03 NOTE — Progress Notes (Signed)
PDMP reviewed.

## 2021-12-11 ENCOUNTER — Encounter (HOSPITAL_COMMUNITY): Payer: Self-pay

## 2021-12-11 ENCOUNTER — Inpatient Hospital Stay (HOSPITAL_COMMUNITY)
Admission: EM | Admit: 2021-12-11 | Discharge: 2021-12-17 | DRG: 812 | Disposition: A | Discharge status: Home / Self Care | Payer: Medicare HMO | Attending: Internal Medicine | Admitting: Internal Medicine

## 2021-12-11 DIAGNOSIS — Z91018 Allergy to other foods: Secondary | ICD-10-CM

## 2021-12-11 DIAGNOSIS — D5700 Hb-SS disease with crisis, unspecified: Secondary | ICD-10-CM | POA: Diagnosis not present

## 2021-12-11 DIAGNOSIS — K219 Gastro-esophageal reflux disease without esophagitis: Secondary | ICD-10-CM | POA: Diagnosis present

## 2021-12-11 DIAGNOSIS — Z8249 Family history of ischemic heart disease and other diseases of the circulatory system: Secondary | ICD-10-CM

## 2021-12-11 DIAGNOSIS — Z79899 Other long term (current) drug therapy: Secondary | ICD-10-CM

## 2021-12-11 DIAGNOSIS — Z9049 Acquired absence of other specified parts of digestive tract: Secondary | ICD-10-CM

## 2021-12-11 DIAGNOSIS — Z888 Allergy status to other drugs, medicaments and biological substances status: Secondary | ICD-10-CM

## 2021-12-11 DIAGNOSIS — G894 Chronic pain syndrome: Secondary | ICD-10-CM | POA: Diagnosis present

## 2021-12-11 DIAGNOSIS — Z841 Family history of disorders of kidney and ureter: Secondary | ICD-10-CM

## 2021-12-11 DIAGNOSIS — Z86711 Personal history of pulmonary embolism: Secondary | ICD-10-CM | POA: Diagnosis present

## 2021-12-11 DIAGNOSIS — Z83438 Family history of other disorder of lipoprotein metabolism and other lipidemia: Secondary | ICD-10-CM

## 2021-12-11 DIAGNOSIS — Z9851 Tubal ligation status: Secondary | ICD-10-CM

## 2021-12-11 DIAGNOSIS — Z833 Family history of diabetes mellitus: Secondary | ICD-10-CM

## 2021-12-11 DIAGNOSIS — Z86718 Personal history of other venous thrombosis and embolism: Secondary | ICD-10-CM

## 2021-12-11 DIAGNOSIS — D72829 Elevated white blood cell count, unspecified: Secondary | ICD-10-CM | POA: Diagnosis present

## 2021-12-11 DIAGNOSIS — J4520 Mild intermittent asthma, uncomplicated: Secondary | ICD-10-CM | POA: Diagnosis present

## 2021-12-11 DIAGNOSIS — Z7901 Long term (current) use of anticoagulants: Secondary | ICD-10-CM

## 2021-12-11 DIAGNOSIS — F1120 Opioid dependence, uncomplicated: Secondary | ICD-10-CM | POA: Diagnosis present

## 2021-12-11 DIAGNOSIS — M545 Low back pain, unspecified: Secondary | ICD-10-CM | POA: Diagnosis present

## 2021-12-11 LAB — COMPREHENSIVE METABOLIC PANEL WITH GFR
ALT: 12 U/L (ref 0–44)
AST: 30 U/L (ref 15–41)
Albumin: 4.3 g/dL (ref 3.5–5.0)
Alkaline Phosphatase: 56 U/L (ref 38–126)
Anion gap: 7 (ref 5–15)
BUN: 8 mg/dL (ref 6–20)
CO2: 23 mmol/L (ref 22–32)
Calcium: 9.1 mg/dL (ref 8.9–10.3)
Chloride: 111 mmol/L (ref 98–111)
Creatinine, Ser: 0.58 mg/dL (ref 0.44–1.00)
GFR, Estimated: >60 mL/min
Glucose, Bld: 97 mg/dL (ref 70–99)
Potassium: 3.8 mmol/L (ref 3.5–5.1)
Sodium: 141 mmol/L (ref 135–145)
Total Bilirubin: 3.6 mg/dL — ABNORMAL HIGH (ref 0.3–1.2)
Total Protein: 8 g/dL (ref 6.5–8.1)

## 2021-12-11 LAB — CBC WITH DIFFERENTIAL/PLATELET
Abs Immature Granulocytes: 0.06 10*3/uL (ref 0.00–0.07)
Basophils Absolute: 0 10*3/uL (ref 0.0–0.1)
Basophils Relative: 0 %
Eosinophils Absolute: 0.1 10*3/uL (ref 0.0–0.5)
Eosinophils Relative: 1 %
HCT: 21.7 % — ABNORMAL LOW (ref 36.0–46.0)
Hemoglobin: 7.8 g/dL — ABNORMAL LOW (ref 12.0–15.0)
Immature Granulocytes: 0 %
Lymphocytes Relative: 17 %
Lymphs Abs: 2.3 10*3/uL (ref 0.7–4.0)
MCH: 31.7 pg (ref 26.0–34.0)
MCHC: 35.9 g/dL (ref 30.0–36.0)
MCV: 88.2 fL (ref 80.0–100.0)
Monocytes Absolute: 1.5 10*3/uL — ABNORMAL HIGH (ref 0.1–1.0)
Monocytes Relative: 11 %
Neutro Abs: 9.8 10*3/uL — ABNORMAL HIGH (ref 1.7–7.7)
Neutrophils Relative %: 71 %
Platelets: 566 10*3/uL — ABNORMAL HIGH (ref 150–400)
RBC: 2.46 MIL/uL — ABNORMAL LOW (ref 3.87–5.11)
RDW: 20.9 % — ABNORMAL HIGH (ref 11.5–15.5)
WBC: 13.8 10*3/uL — ABNORMAL HIGH (ref 4.0–10.5)
nRBC: 0.2 % (ref 0.0–0.2)

## 2021-12-11 LAB — I-STAT BETA HCG BLOOD, ED (MC, WL, AP ONLY): I-stat hCG, quantitative: <5 m[IU]/mL (ref <5)

## 2021-12-11 LAB — RETICULOCYTES
Immature Retic Fract: 18.8 % — ABNORMAL HIGH (ref 2.3–15.9)
RBC.: 2.49 MIL/uL — ABNORMAL LOW (ref 3.87–5.11)
Retic Count, Absolute: 487.5 K/uL — ABNORMAL HIGH (ref 19.0–186.0)
Retic Ct Pct: 18.4 % — ABNORMAL HIGH (ref 0.4–3.1)

## 2021-12-11 LAB — URINALYSIS, ROUTINE W REFLEX MICROSCOPIC
Bilirubin Urine: NEGATIVE
Glucose, UA: NEGATIVE mg/dL
Hgb urine dipstick: NEGATIVE
Ketones, ur: NEGATIVE mg/dL
Leukocytes,Ua: NEGATIVE
Nitrite: NEGATIVE
Protein, ur: NEGATIVE mg/dL
Specific Gravity, Urine: 1.011 (ref 1.005–1.030)
pH: 5 (ref 5.0–8.0)

## 2021-12-11 LAB — LIPASE, BLOOD: Lipase: 33 U/L (ref 11–51)

## 2021-12-11 MED ORDER — HYDROMORPHONE HCL 2 MG/ML IJ SOLN
2.0000 mg | INTRAMUSCULAR | Status: AC
  Administered 2021-12-11: 2 mg via INTRAVENOUS
  Filled 2021-12-11: qty 1

## 2021-12-11 MED ORDER — SODIUM CHLORIDE 0.9 % IV SOLN
12.5000 mg | Freq: Once | INTRAVENOUS | Status: AC
  Administered 2021-12-11: 12.5 mg via INTRAVENOUS
  Filled 2021-12-11: qty 12.5

## 2021-12-11 MED ORDER — ONDANSETRON HCL 4 MG/2ML IJ SOLN: 4.0000 mg | INTRAMUSCULAR | Status: DC | PRN

## 2021-12-11 MED ORDER — SODIUM CHLORIDE 0.45 % IV SOLN: INTRAVENOUS | Status: DC

## 2021-12-11 MED ORDER — KETOROLAC TROMETHAMINE 15 MG/ML IJ SOLN
15.0000 mg | INTRAMUSCULAR | Status: AC
  Administered 2021-12-11: 15 mg via INTRAVENOUS
  Filled 2021-12-11: qty 1

## 2021-12-11 MED ORDER — HYDROMORPHONE HCL 1 MG/ML IJ SOLN
1.0000 mg | Freq: Once | INTRAMUSCULAR | Status: AC | PRN
  Administered 2021-12-12: 1 mg via INTRAVENOUS
  Filled 2021-12-11: qty 1

## 2021-12-11 NOTE — ED Provider Notes (Signed)
[facility] DEPT Provider Note   CSN: [id number] Arrival date & time: 12/11/21  1455     History  Chief Complaint  Patient presents with   Sickle Cell Pain Crisis    Ronni Osterberg is a 41 y.o. female.  The history is provided by the patient and medical records. No language interpreter was used.  Sickle Cell Pain Crisis   41 year old female significant history of sickle cell anemia with frequent sickle cell related pain and chronic pain, opioid dependency, history of PE on Xarelto, GERD, presenting with complaints of pain.  Patient report for the past 4 days she has had progressive worsening pain to the abdomen and lower back that felt similar to prior sickle cell crisis.  Pain is sharp throbbing moderate in severity persistent and not improved with home medication.  She endorsed nausea and occasional vomiting without any diarrhea or constipation.  She denies having fever chills chest pain shortness of breath productive cough or dysuria.  She believes weather changes may contribute to her symptoms.  Home Medications Prior to Admission medications   Medication Sig Start Date End Date Taking? Authorizing Provider  albuterol (VENTOLIN HFA) 108 (90 Base) MCG/ACT inhaler Inhale 2 puffs into the lungs every 6 (six) hours as needed for wheezing or shortness of breath. 10/29/21   Fenton Foy, NP  cholecalciferol (VITAMIN D3) 25 MCG (1000 UNIT) tablet Take 1,000 Units by mouth daily.    [provider]  Cyanocobalamin (VITAMIN B-12 PO) Take 1 tablet by mouth daily.    [provider]  Deferasirox 360 MG TABS Take 1,080 mg by mouth at bedtime. 04/23/21   [provider]  diphenhydrAMINE (BENADRYL) 25 mg capsule Take 25 mg by mouth 3 (three) times daily as needed for itching.    [provider]  fluticasone (FLONASE) 50 MCG/ACT nasal spray Place 1 spray into both nostrils daily as needed for allergies or rhinitis. 10/29/21  11/28/21  Fenton Foy, NP  folic acid (FOLVITE) 1 MG tablet Take 1 mg by mouth at bedtime.    [provider]  HYDROmorphone (DILAUDID) 4 MG tablet Take 1 tablet (4 mg total) by mouth every 6 (six) hours as needed for up to 15 days for severe pain. 12/03/21 12/18/21  [name], FNP  mirtazapine (REMERON) 45 MG tablet Take 1 tablet (45 mg total) by mouth at bedtime. 10/29/21 10/29/22  Fenton Foy, NP  mometasone-formoterol (DULERA) 100-5 MCG/ACT AERO Inhale 2 puffs into the lungs daily as needed for wheezing or shortness of breath. 10/29/21 10/29/22  Fenton Foy, NP  naloxone [facility]) nasal spray 4 mg/0.1 mL Place 0.4 mg into the nose once as needed. 11/01/21   [provider]  omeprazole (PRILOSEC) 20 MG capsule Take 1 capsule (20 mg total) by mouth 2 (two) times daily before a meal. Patient taking differently: Take 20 mg by mouth daily as needed (heartburn). 04/23/21 04/23/22  Vevelyn Francois, NP  polyethylene glycol (MIRALAX / GLYCOLAX) 17 g packet Take 17 g by mouth daily as needed for mild constipation.    [provider]  promethazine (PHENERGAN) 25 MG tablet Take 0.5-1 tablets (12.5-25 mg total) by mouth every 6 (six) hours as needed for nausea or vomiting. 10/31/21 10/31/22  Fenton Foy, NP  voxelotor (OXBRYTA) 500 MG TABS tablet Take 1,500 mg by mouth at bedtime. 11/06/21 11/01/22  [provider]  XARELTO 20 MG TABS tablet Take 1 tablet (20 mg total) by mouth  daily with supper. 10/29/21 10/29/22  Fenton Foy, NP      Allergies    Cefaclor, Hydroxyurea, Omeprazole, Ketamine, and Tomato    Review of Systems   Review of Systems  All other systems reviewed and are negative.   Physical Exam Updated Vital Signs BP 103/62   Pulse 96   Temp 98.7 F (37.1 C) (Oral)   Resp 18   LMP 11/17/2021 (Approximate)   SpO2 92%  Physical Exam Vitals and nursing note reviewed.  Constitutional:      General: She is not in acute distress.     Appearance: She is well-developed.  HENT:     Head: Atraumatic.  Eyes:     Conjunctiva/sclera: Conjunctivae normal.  Cardiovascular:     Rate and Rhythm: Normal rate and regular rhythm.     Pulses: Normal pulses.     Heart sounds: Normal heart sounds.  Pulmonary:     Effort: Pulmonary effort is normal.  Abdominal:     Palpations: Abdomen is soft.     Tenderness: There is no abdominal tenderness.  Musculoskeletal:     Cervical back: Neck supple.  Skin:    Findings: No rash.  Neurological:     Mental Status: She is alert.  Psychiatric:        Mood and Affect: Mood normal.     ED Results / Procedures / Treatments   Labs (all labs ordered are listed, but only abnormal results are displayed) Labs Reviewed  COMPREHENSIVE METABOLIC PANEL - Abnormal; Notable for the following components:      Result Value   Total Bilirubin 3.6 (*)    All other components within normal limits  CBC WITH DIFFERENTIAL/PLATELET - Abnormal; Notable for the following components:   WBC 13.8 (*)    RBC 2.46 (*)    Hemoglobin 7.8 (*)    HCT 21.7 (*)    RDW 20.9 (*)    Platelets 566 (*)    Neutro Abs 9.8 (*)    Monocytes Absolute 1.5 (*)    All other components within normal limits  RETICULOCYTES - Abnormal; Notable for the following components:   Retic Ct Pct 18.4 (*)    RBC. 2.49 (*)    Retic Count, Absolute 487.5 (*)    Immature Retic Fract 18.8 (*)    All other components within normal limits  LIPASE, BLOOD  URINALYSIS, ROUTINE W REFLEX MICROSCOPIC  I-STAT BETA HCG BLOOD, ED ([facility], [facility], [facility] ONLY)    EKG None  Date: 12/11/2021  Rate: 90  Rhythm: normal sinus rhythm  QRS Axis: normal  Intervals: normal  ST/T Wave abnormalities: normal  Conduction Disutrbances: none  Narrative Interpretation:   Old EKG Reviewed: No significant changes noted    Radiology No results found.  Procedures Procedures    Medications Ordered in ED Medications  0.45 % sodium chloride infusion (  Intravenous New Bag/Given 12/11/21 1835)  ondansetron (ZOFRAN) injection 4 mg (has no administration in time range)  ketorolac (TORADOL) 15 MG/ML injection 15 mg (15 mg Intravenous Given 12/11/21 1838)  HYDROmorphone (DILAUDID) injection 2 mg (2 mg Intravenous Given 12/11/21 1839)  HYDROmorphone (DILAUDID) injection 2 mg (2 mg Intravenous Given 12/11/21 1912)  HYDROmorphone (DILAUDID) injection 2 mg (2 mg Intravenous Given 12/11/21 1950)  diphenhydrAMINE (BENADRYL) 12.5 mg in sodium chloride 0.9 % 50 mL IVPB (12.5 mg Intravenous New Bag/Given 12/11/21 1838)    ED Course/ Medical Decision Making/ A&P  Medical Decision Making Amount and/or Complexity of Data Reviewed Labs: ordered.  Risk Prescription drug management.   BP 98/73   Pulse 84   Temp 98.7 F (37.1 C) (Oral)   Resp 18   LMP 11/17/2021 (Approximate)   SpO2 94%   5:59 PM This is a 41 year old female significant history of sickle cell disease as well as history of chronic pain syndrome presenting with complaints of pain to abdomen and back for the past 4 days.  Pain felt similar to prior sickle cell crisis that she has had in the past according to the patient.  She thought it may be triggered by weather changes and stress.  She does not endorse any fever or chills no chest pain shortness of breath or productive cough no dysuria.  She has had prior cholecystectomy but still has an intact appendix.  On exam patient is well-appearing appears to be in no acute discomfort.  Abdomen is soft nontender.  I have low suspicion for acute abdominal pathology.  I suspect this is likely pain related to her sickle cell disease perhaps a flareup of her chronic pain syndrome.  We will treat using sickle cell protocol.  8:52 PM Labs obtained independently viewed interpreted by me.  Fortunately electrolyte panels are reassuring, hemoglobin is 7.8 however this is not far off from a baseline hemoglobin, normal lipase, pregnancy  test is negative.  EKG independently viewed interpreted by me and shows normal sinus rhythm without concerning arrhythmia or ischemic changes.  9:24 PM On reassessment after the patient received 3 separate dose of pain medication per protocol she reported no adequate management of her current pain and request to be admitted for further pain control.  We will consult medicine for admission.  9:43 PM Appreciate consultation from [facility] hospital[facility], Dr. Eddie Dibbles, who agrees to see and admit patient for further managements of sickle cell related pain.  This patient presents to the ED for concern of abd pain, this involves an extensive number of treatment options, and is a complaint that carries with it a high risk of complications and morbidity.  The differential diagnosis includes sickle cell crisis, gastritis, colitis, diverticulitis, pancreatitis, appendicitis, GERD, UTI  Co morbidities that complicate the patient evaluation sickle cell disease  Chronic pain syndrome Additional history obtained:  Additional history obtained from patient External records from outside source obtained and reviewed including notes from sickle cell clinic  Lab Tests:  I Ordered, and personally interpreted labs.  The pertinent results include:  as above    Cardiac Monitoring:  The patient was maintained on a cardiac monitor.  I personally viewed and interpreted the cardiac monitored which showed an underlying rhythm of: NSR  Medicines ordered and prescription drug management:  I ordered medication including dilaudid  for sickle cell pain Reevaluation of the patient after these medicines showed that the patient improved I have reviewed the patients home medicines and have made adjustments as needed  Test Considered: as above  Critical Interventions: sickle cell pain management protocol  Consultations Obtained:  I requested consultation with the [facility],  and discussed lab and imaging findings  as well as pertinent plan - they recommend: admission  Problem List / ED Course: sickle cell pain  Reevaluation:  After the interventions noted above, I reevaluated the patient and found that they have :stayed the same  Social Determinants of Health:   Dispostion:  After consideration of the diagnostic results and the patients response to treatment, I feel that the patent would benefit  from admission.         Final Clinical Impression(s) / ED Diagnoses Final diagnoses:  Sickle cell pain crisis [facility])    Rx / DC Orders ED Discharge Orders     None         Domenic Moras, PA-C 12/11/21 2145    Godfrey Pick, MD 12/12/21 [phone]

## 2021-12-12 ENCOUNTER — Encounter (HOSPITAL_COMMUNITY): Payer: Self-pay | Admitting: Internal Medicine

## 2021-12-12 ENCOUNTER — Other Ambulatory Visit: Payer: Self-pay | Admitting: Nurse Practitioner

## 2021-12-12 ENCOUNTER — Other Ambulatory Visit: Payer: Self-pay

## 2021-12-12 DIAGNOSIS — Z8249 Family history of ischemic heart disease and other diseases of the circulatory system: Secondary | ICD-10-CM | POA: Diagnosis not present

## 2021-12-12 DIAGNOSIS — Z86711 Personal history of pulmonary embolism: Secondary | ICD-10-CM | POA: Diagnosis not present

## 2021-12-12 DIAGNOSIS — Z841 Family history of disorders of kidney and ureter: Secondary | ICD-10-CM | POA: Diagnosis not present

## 2021-12-12 DIAGNOSIS — Z888 Allergy status to other drugs, medicaments and biological substances status: Secondary | ICD-10-CM | POA: Diagnosis not present

## 2021-12-12 DIAGNOSIS — M545 Low back pain, unspecified: Secondary | ICD-10-CM | POA: Diagnosis present

## 2021-12-12 DIAGNOSIS — D5700 Hb-SS disease with crisis, unspecified: Secondary | ICD-10-CM | POA: Diagnosis present

## 2021-12-12 DIAGNOSIS — Z9049 Acquired absence of other specified parts of digestive tract: Secondary | ICD-10-CM | POA: Diagnosis not present

## 2021-12-12 DIAGNOSIS — Z86718 Personal history of other venous thrombosis and embolism: Secondary | ICD-10-CM | POA: Diagnosis not present

## 2021-12-12 DIAGNOSIS — F1120 Opioid dependence, uncomplicated: Secondary | ICD-10-CM | POA: Diagnosis present

## 2021-12-12 DIAGNOSIS — Z9851 Tubal ligation status: Secondary | ICD-10-CM | POA: Diagnosis not present

## 2021-12-12 DIAGNOSIS — G894 Chronic pain syndrome: Secondary | ICD-10-CM | POA: Diagnosis present

## 2021-12-12 DIAGNOSIS — J4520 Mild intermittent asthma, uncomplicated: Secondary | ICD-10-CM | POA: Diagnosis present

## 2021-12-12 DIAGNOSIS — K219 Gastro-esophageal reflux disease without esophagitis: Secondary | ICD-10-CM | POA: Diagnosis present

## 2021-12-12 DIAGNOSIS — Z83438 Family history of other disorder of lipoprotein metabolism and other lipidemia: Secondary | ICD-10-CM | POA: Diagnosis not present

## 2021-12-12 DIAGNOSIS — D72829 Elevated white blood cell count, unspecified: Secondary | ICD-10-CM | POA: Diagnosis present

## 2021-12-12 DIAGNOSIS — Z833 Family history of diabetes mellitus: Secondary | ICD-10-CM | POA: Diagnosis not present

## 2021-12-12 DIAGNOSIS — Z79899 Other long term (current) drug therapy: Secondary | ICD-10-CM | POA: Diagnosis not present

## 2021-12-12 DIAGNOSIS — Z91018 Allergy to other foods: Secondary | ICD-10-CM | POA: Diagnosis not present

## 2021-12-12 DIAGNOSIS — Z7901 Long term (current) use of anticoagulants: Secondary | ICD-10-CM | POA: Diagnosis not present

## 2021-12-12 MED ORDER — DIPHENHYDRAMINE HCL 25 MG PO CAPS: 25.0000 mg | ORAL_CAPSULE | ORAL | Status: DC | PRN

## 2021-12-12 MED ORDER — SODIUM CHLORIDE 0.45 % IV SOLN: INTRAVENOUS | Status: AC

## 2021-12-12 MED ORDER — SENNOSIDES-DOCUSATE SODIUM 8.6-50 MG PO TABS
1.0000 | ORAL_TABLET | Freq: Two times a day (BID) | ORAL | Status: DC
  Administered 2021-12-12 – 2021-12-17 (×11): 1 via ORAL
  Filled 2021-12-12 (×11): qty 1

## 2021-12-12 MED ORDER — DEFERASIROX 360 MG PO TABS: 1080.0000 mg | ORAL_TABLET | Freq: Every day | ORAL | Status: DC

## 2021-12-12 MED ORDER — DIPHENHYDRAMINE HCL 12.5 MG/5ML PO ELIX: 12.5000 mg | ORAL_SOLUTION | Freq: Four times a day (QID) | ORAL | Status: DC | PRN

## 2021-12-12 MED ORDER — SODIUM CHLORIDE 0.9% FLUSH: 9.0000 mL | INTRAVENOUS | Status: DC | PRN

## 2021-12-12 MED ORDER — HYDROMORPHONE 1 MG/ML IV SOLN
INTRAVENOUS | Status: DC
  Administered 2021-12-12: 30 mg via INTRAVENOUS
  Filled 2021-12-12: qty 30

## 2021-12-12 MED ORDER — KETOROLAC TROMETHAMINE 15 MG/ML IJ SOLN
15.0000 mg | Freq: Four times a day (QID) | INTRAMUSCULAR | Status: AC
  Administered 2021-12-12 – 2021-12-16 (×20): 15 mg via INTRAVENOUS
  Filled 2021-12-12 (×20): qty 1

## 2021-12-12 MED ORDER — NALOXONE HCL 0.4 MG/ML IJ SOLN: 0.4000 mg | INTRAMUSCULAR | Status: DC | PRN

## 2021-12-12 MED ORDER — PROMETHAZINE HCL 25 MG PO TABS
12.5000 mg | ORAL_TABLET | Freq: Four times a day (QID) | ORAL | Status: AC | PRN
  Administered 2021-12-12 – 2021-12-13 (×2): 12.5 mg via ORAL
  Filled 2021-12-12 (×2): qty 1

## 2021-12-12 MED ORDER — POLYETHYLENE GLYCOL 3350 17 G PO PACK: 17.0000 g | PACK | Freq: Every day | ORAL | Status: DC | PRN

## 2021-12-12 MED ORDER — VOXELOTOR 500 MG PO TABS: 1500.0000 mg | ORAL_TABLET | Freq: Every day | ORAL | Status: DC

## 2021-12-12 MED ORDER — ALBUTEROL SULFATE (2.5 MG/3ML) 0.083% IN NEBU: 3.0000 mL | INHALATION_SOLUTION | Freq: Four times a day (QID) | RESPIRATORY_TRACT | Status: DC | PRN

## 2021-12-12 MED ORDER — FOLIC ACID 1 MG PO TABS
1.0000 mg | ORAL_TABLET | Freq: Every day | ORAL | Status: DC
  Administered 2021-12-12 – 2021-12-17 (×6): 1 mg via ORAL
  Filled 2021-12-12 (×6): qty 1

## 2021-12-12 MED ORDER — ONDANSETRON HCL 4 MG/2ML IJ SOLN
4.0000 mg | Freq: Four times a day (QID) | INTRAMUSCULAR | Status: DC | PRN
  Filled 2021-12-12: qty 2

## 2021-12-12 MED ORDER — HYDROMORPHONE 1 MG/ML IV SOLN
INTRAVENOUS | Status: DC
  Administered 2021-12-12: 6.3 mg via INTRAVENOUS
  Administered 2021-12-12: 1 mg via INTRAVENOUS
  Administered 2021-12-13: 2.5 mg via INTRAVENOUS
  Administered 2021-12-13: 2 mg via INTRAVENOUS
  Administered 2021-12-13: 30 mg via INTRAVENOUS
  Administered 2021-12-13: 3 mg via INTRAVENOUS
  Administered 2021-12-13: 2.5 mg via INTRAVENOUS
  Administered 2021-12-13: 6 mg via INTRAVENOUS
  Administered 2021-12-13: 5.21 mg via INTRAVENOUS
  Administered 2021-12-14: 5.5 mg via INTRAVENOUS
  Administered 2021-12-14: 2 mg via INTRAVENOUS
  Administered 2021-12-14: 8 mg via INTRAVENOUS
  Filled 2021-12-12: qty 30

## 2021-12-12 MED ORDER — SODIUM CHLORIDE 0.9% FLUSH
10.0000 mL | INTRAVENOUS | Status: DC | PRN
  Administered 2021-12-15: 10 mL

## 2021-12-12 MED ORDER — DIPHENHYDRAMINE HCL 50 MG/ML IJ SOLN: 12.5000 mg | Freq: Four times a day (QID) | INTRAMUSCULAR | Status: DC | PRN

## 2021-12-12 MED ORDER — RIVAROXABAN 20 MG PO TABS
20.0000 mg | ORAL_TABLET | Freq: Every day | ORAL | Status: DC
  Administered 2021-12-12 – 2021-12-16 (×5): 20 mg via ORAL
  Filled 2021-12-12: qty 2
  Filled 2021-12-12 (×4): qty 1

## 2021-12-12 MED ORDER — MOMETASONE FURO-FORMOTEROL FUM 100-5 MCG/ACT IN AERO
2.0000 | INHALATION_SPRAY | Freq: Every day | RESPIRATORY_TRACT | Status: DC | PRN
  Filled 2021-12-12: qty 8.8

## 2021-12-12 MED ORDER — HYDROMORPHONE HCL 1 MG/ML IJ SOLN
1.0000 mg | Freq: Once | INTRAMUSCULAR | Status: AC | PRN
  Administered 2021-12-12: 1 mg via INTRAVENOUS
  Filled 2021-12-12: qty 1

## 2021-12-12 MED ORDER — CHLORHEXIDINE GLUCONATE CLOTH 2 % EX PADS
6.0000 | MEDICATED_PAD | Freq: Every day | CUTANEOUS | Status: DC
  Administered 2021-12-12 – 2021-12-16 (×4): 6 via TOPICAL

## 2021-12-12 MED ORDER — MIRTAZAPINE 30 MG PO TABS
45.0000 mg | ORAL_TABLET | Freq: Every day | ORAL | Status: DC
  Administered 2021-12-12 – 2021-12-16 (×5): 45 mg via ORAL
  Filled 2021-12-12 (×5): qty 1

## 2021-12-12 NOTE — Progress Notes (Signed)
Transition of Care [facility]) Screening Note  Patient Details  Name: Molly Gutierrez Date of Birth: [birth year]  Transition of Care [facility]) CM/SW Contact:    Sherie Don, LCSW Phone Number: 12/12/2021, 9:37 AM  Transition of Care Department [facility]) has reviewed patient and no TOC needs have been identified at this time. We will continue to monitor patient advancement through interdisciplinary progression rounds. If new patient transition needs arise, please place a TOC consult.

## 2021-12-12 NOTE — Progress Notes (Signed)
Molly Gutierrez is a 41 year old female with a medical history significant for sickle cell disease, chronic pain syndrome, opiate dependence and tolerance, history of PE on xarelto, mild intermitttent asthma, and anemia of chronic disease was admitted overnight in sickle cell crisis.  Patient says that pain intensity has not changed since starting PCA. She rates pain at 10/10, constant and sharp.   Care plan:  Patient is opiate tolerant, discontinue full dose PCA and start opiate tolerant PCA.  Reduce IV fluids Repeat labs in am   Reassess patient in am.   Molly Pounds  APRN, MSN, FNP-C Patient [place] [address] [place], [place] [address] [phone]

## 2021-12-12 NOTE — H&P (Signed)
History and Physical    [facility] GUR:[id number] DOB: Jan 08, 1981 DOA: 12/11/2021  PCP: Fenton Foy, NP  Patient coming from: Home  Chief Complaint: Abdominal and back pain  HPI: Molly Gutierrez is a 41 y.o. female with medical history significant of sickle cell disease, chronic pain syndrome, opiate dependence and tolerance, frequent admissions for pain crisis, iron overload, mild intermittent asthma, anemia of chronic disease, history of PE on Xarelto presented to the ED complaining of abdominal and back pain.  Vital signs stable.  Labs showing WBC 13.8, hemoglobin 7.8 (at baseline), platelet count 566k (chronically elevated).  Sodium 141, potassium 3.8, chloride 111, bicarb 23, BUN 8, creatinine 0.5, glucose 97.  T. bili 3.6 (chronically elevated), remainder of LFTs normal.  Lipase normal.  Beta-hCG negative.  UA without signs of infection. Patient was given multiple doses of IV Dilaudid and IV Toradol 15 mg.  Patient reports 4-day history of lower back pain and epigastric abdominal pain associated with nausea and vomiting.  States she has experienced similar symptoms in the past with sickle cell pain crisis episodes.  Denies fevers, chills, or diarrhea.  States she was able to eat a full sandwich in the ED and has not vomited.  Denies cough, shortness of breath, or chest pain.  States she continues to have pain despite receiving multiple doses of Dilaudid.  Review of Systems:  Review of Systems  All other systems reviewed and are negative.   Past Medical History:  Diagnosis Date   Asthma    Eczema    History of pulmonary embolus (PE)    Sickle cell anemia ([place])     Past Surgical History:  Procedure Laterality Date   CHOLECYSTECTOMY     ERCP     JOINT REPLACEMENT     PORTA CATH INSERTION     TUBAL LIGATION     WISDOM TOOTH EXTRACTION       reports that she has never smoked. She has never used smokeless tobacco. She reports that she does not drink alcohol  and does not use drugs.  Allergies  Allergen Reactions   Cefaclor Hives and Swelling   Hydroxyurea Palpitations and Other (See Comments)    Lowers "blood levels" and heart rate (causes HYPOtension); "it messes me up, it drops my levels and stuff"    Omeprazole Other (See Comments)    Causes "sharp pains in the stomach"   Ketamine Palpitations and Other (See Comments)    "Pt states she has had previous reaction to ketamine. States she becomes flushed, heart races, dizzy, and feels like she is going to pass out."   Tomato Itching and Rash    "Break out"    Family History  Problem Relation Age of Onset   Renal Disease Mother    Hypertension Mother    High Cholesterol Mother    Heart attack Mother    Diabetes Brother     Prior to Admission medications   Medication Sig Start Date End Date Taking? Authorizing Provider  albuterol (VENTOLIN HFA) 108 (90 Base) MCG/ACT inhaler Inhale 2 puffs into the lungs every 6 (six) hours as needed for wheezing or shortness of breath. 10/29/21  Yes Fenton Foy, NP  cholecalciferol (VITAMIN D3) 25 MCG (1000 UNIT) tablet Take 1,000 Units by mouth daily.   Yes [provider]  Cyanocobalamin (VITAMIN B-12 PO) Take 1 tablet by mouth daily.   Yes [provider]  Deferasirox 360 MG TABS Take 1,080 mg by mouth at bedtime. 04/23/21  Yes [provider]  diphenhydrAMINE (BENADRYL) 25 mg capsule Take 25 mg by mouth 3 (three) times daily as needed for itching.   Yes [provider]  fluticasone (FLONASE) 50 MCG/ACT nasal spray Place 1 spray into both nostrils daily as needed for allergies or rhinitis. 10/29/21 12/11/21 Yes Fenton Foy, NP  folic acid (FOLVITE) 1 MG tablet Take 1 mg by mouth at bedtime.   Yes [provider]  HYDROmorphone (DILAUDID) 4 MG tablet Take 1 tablet (4 mg total) by mouth every 6 (six) hours as needed for up to 15 days for severe pain. 12/03/21 12/18/21 Yes [name], FNP   mirtazapine (REMERON) 45 MG tablet Take 1 tablet (45 mg total) by mouth at bedtime. 10/29/21 10/29/22 Yes Fenton Foy, NP  mometasone-formoterol (DULERA) 100-5 MCG/ACT AERO Inhale 2 puffs into the lungs daily as needed for wheezing or shortness of breath. 10/29/21 10/29/22 Yes Fenton Foy, NP  naloxone [facility]) nasal spray 4 mg/0.1 mL Place 0.4 mg into the nose once as needed. 11/01/21  Yes [provider]  omeprazole (PRILOSEC) 20 MG capsule Take 1 capsule (20 mg total) by mouth 2 (two) times daily before a meal. Patient taking differently: Take 20 mg by mouth daily as needed (heartburn). 04/23/21 04/23/22 Yes King, Diona Foley, NP  polyethylene glycol (MIRALAX / GLYCOLAX) 17 g packet Take 17 g by mouth daily as needed for mild constipation.   Yes [provider]  promethazine (PHENERGAN) 25 MG tablet Take 0.5-1 tablets (12.5-25 mg total) by mouth every 6 (six) hours as needed for nausea or vomiting. Patient taking differently: Take 25 mg by mouth every 6 (six) hours as needed for nausea or vomiting. 10/31/21 10/31/22 Yes Fenton Foy, NP  voxelotor (OXBRYTA) 500 MG TABS tablet Take 1,500 mg by mouth at bedtime. 11/06/21 11/01/22 Yes [provider]  XARELTO 20 MG TABS tablet Take 1 tablet (20 mg total) by mouth daily with supper. 10/29/21 10/29/22 Yes Fenton Foy, NP    Physical Exam: Vitals:   12/11/21 2105 12/12/21 0000 12/12/21 0030 12/12/21 0153  BP: 105/72 115/78 103/77 105/74  Pulse: 98 91 84 84  Resp: '17  18 19  '$ Temp:   98.2 F (36.8 C) 98.6 F (37 C)  TempSrc:      SpO2: 95% 97% 96% 97%    Physical Exam Vitals reviewed.  Constitutional:      General: She is not in acute distress. HENT:     Head: Normocephalic and atraumatic.  Eyes:     Extraocular Movements: Extraocular movements intact.  Cardiovascular:     Rate and Rhythm: Normal rate and regular rhythm.     Pulses: Normal pulses.  Pulmonary:     Effort: Pulmonary effort is normal. No  respiratory distress.     Breath sounds: Normal breath sounds. No wheezing or rales.  Abdominal:     General: Bowel sounds are normal. There is no distension.     Palpations: Abdomen is soft.     Tenderness: There is no abdominal tenderness. There is no guarding or rebound.  Musculoskeletal:        General: No swelling or tenderness.     Cervical back: Normal range of motion.  Skin:    General: Skin is warm and dry.  Neurological:     General: No focal deficit present.     Mental Status: She is alert and oriented to person, place, and time.      Labs  on Admission: I have personally reviewed following labs and imaging studies  CBC: Recent Labs  Lab 12/11/21 1850  WBC 13.8*  NEUTROABS 9.8*  HGB 7.8*  HCT 21.7*  MCV 88.2  PLT 423*   Basic Metabolic Panel: Recent Labs  Lab 12/11/21 1850  NA 141  K 3.8  CL 111  CO2 23  GLUCOSE 97  BUN 8  CREATININE 0.58  CALCIUM 9.1   GFR: CrCl cannot be calculated (Unknown ideal weight.). Liver Function Tests: Recent Labs  Lab 12/11/21 1850  AST 30  ALT 12  ALKPHOS 56  BILITOT 3.6*  PROT 8.0  ALBUMIN 4.3   Recent Labs  Lab 12/11/21 1850  LIPASE 33   No results for input(s): "AMMONIA" in the last 168 hours. Coagulation Profile: No results for input(s): "INR", "PROTIME" in the last 168 hours. Cardiac Enzymes: No results for input(s): "CKTOTAL", "CKMB", "CKMBINDEX", "TROPONINI" in the last 168 hours. BNP (last 3 results) No results for input(s): "PROBNP" in the last 8760 hours. HbA1C: No results for input(s): "HGBA1C" in the last 72 hours. CBG: No results for input(s): "GLUCAP" in the last 168 hours. Lipid Profile: No results for input(s): "CHOL", "HDL", "LDLCALC", "TRIG", "CHOLHDL", "LDLDIRECT" in the last 72 hours. Thyroid Function Tests: No results for input(s): "TSH", "T4TOTAL", "FREET4", "T3FREE", "THYROIDAB" in the last 72 hours. Anemia Panel: Recent Labs    12/11/21 1850  RETICCTPCT 18.4*   Urine  analysis:    Component Value Date/Time   COLORURINE YELLOW 12/11/2021 2103   APPEARANCEUR CLEAR 12/11/2021 2103   LABSPEC 1.011 12/11/2021 2103   PHURINE 5.0 12/11/2021 2103   GLUCOSEU NEGATIVE 12/11/2021 2103   HGBUR NEGATIVE 12/11/2021 2103   BILIRUBINUR NEGATIVE 12/11/2021 2103   BILIRUBINUR neg 08/03/2020 1429   KETONESUR NEGATIVE 12/11/2021 2103   PROTEINUR NEGATIVE 12/11/2021 2103   UROBILINOGEN 1.0 08/03/2020 1429   NITRITE NEGATIVE 12/11/2021 2103   LEUKOCYTESUR NEGATIVE 12/11/2021 2103    Radiological Exams on Admission: I have personally reviewed images No results found.  EKG: Independently reviewed.  Sinus rhythm, mild T wave abnormality in lateral leads.  No significant change since prior tracing.  Assessment and Plan  Sickle cell disease with pain crisis -0.45% saline at 100 cc/h -IV Dilaudid PCA -IV Toradol 15 mg every 6 hours -Continue folic acid and voxelotor -Monitor vital signs closely and reevaluate pain scale regularly  Epigastric abdominal pain, nausea, vomiting Lipase is normal and no change in LFTs from baseline.  Abdominal exam benign.  She was able to eat a full sandwich and has not vomited. -Continue pain management -Antiemetic as needed  Mild leukocytosis Likely reactive.  Patient is afebrile.  No infectious signs or symptoms. -Continue to monitor  Anemia of chronic disease Hemoglobin at baseline and no clinical indication for blood transfusion at this time. -Continue to monitor H&H  History of PE -Continue Xarelto  Mild intermittent asthma Stable, no signs of acute exacerbation. -Continue home inhalers  History of transfusional iron overload Followed by heme-onc. -Continue Deferasirox  DVT prophylaxis: Xarelto Code Status: Full Code Family Communication: No family available at this time. Level of care: Med-Surg Admission status: It is my clinical opinion that referral for OBSERVATION is reasonable and necessary in this patient  based on the above information provided. The aforementioned taken together are felt to place the patient at high risk for further clinical deterioration. However, it is anticipated that the patient may be medically stable for discharge from the hospital within 24 to 48 hours.  [name] MD [facility]  If 7PM-7AM, please contact night-coverage [email or web address]  12/12/2021, 4:12 AM

## 2021-12-13 ENCOUNTER — Other Ambulatory Visit: Payer: Self-pay | Admitting: Nurse Practitioner

## 2021-12-13 DIAGNOSIS — D5700 Hb-SS disease with crisis, unspecified: Secondary | ICD-10-CM | POA: Diagnosis not present

## 2021-12-13 MED ORDER — DIPHENHYDRAMINE HCL 50 MG/ML IJ SOLN
12.5000 mg | Freq: Four times a day (QID) | INTRAMUSCULAR | Status: AC | PRN
  Administered 2021-12-13 – 2021-12-14 (×2): 12.5 mg via INTRAVENOUS
  Filled 2021-12-13 (×2): qty 1

## 2021-12-13 NOTE — Progress Notes (Signed)
Subjective: Molly Gutierrez is a 41 year old female with a medical history significant for sickle cell disease, chronic pain syndrome, opiate dependence and tolerance, history of PE on Xarelto, history of anemia of chronic disease, and mild intermittent asthma was admitted for sickle cell pain crisis.  Patient continues to complain of low back pain and lower extremity pain.  Pain intensity is 7/10.  Patient denies headache, chest pain, urinary symptoms, vomiting, or diarrhea.  She endorses some nausea.  Objective:  Vital signs in last 24 hours:  Vitals:   12/13/21 0458 12/13/21 0756 12/13/21 0820 12/13/21 1114  BP:   96/62   Pulse:   83   Resp: '14 14 15 14  '$ Temp:   98 F (36.7 C)   TempSrc:      SpO2: 94% 91% 91% 91%  Weight:      Height:        Intake/Output from previous day:   Intake/Output Summary (Last 24 hours) at 12/13/2021 1205 Last data filed at 12/12/2021 1500 Gross per 24 hour  Intake 692.24 ml  Output --  Net 692.24 ml    Physical Exam: General: Alert, awake, oriented x3, in no acute distress.  HEENT: [place]/AT [place]ERL, EOMI Neck: Trachea midline,  no masses, no thyromegal,y no JVD, no carotid bruit OROPHARYNX:  Moist, No exudate/ erythema/lesions.  Heart: Regular rate and rhythm, without murmurs, rubs, gallops, PMI non-displaced, no heaves or thrills on palpation.  Lungs: Clear to auscultation, no wheezing or rhonchi noted. No increased vocal fremitus resonant to percussion  Abdomen: Soft, nontender, nondistended, positive bowel sounds, no masses no hepatosplenomegaly noted..  Neuro: No focal neurological deficits noted cranial nerves II through XII grossly intact. DTRs 2+ bilaterally upper and lower extremities. Strength 5 out of 5 in bilateral upper and lower extremities. Musculoskeletal: No warm swelling or erythema around joints, no spinal tenderness noted. Psychiatric: Patient alert and oriented x3, good insight and cognition, good recent to remote  recall. Lymph node survey: No cervical axillary or inguinal lymphadenopathy noted.  Lab Results:  Basic Metabolic Panel:    Component Value Date/Time   NA 141 12/11/2021 1850   NA 137 08/02/2020 1538   K 3.8 12/11/2021 1850   CL 111 12/11/2021 1850   CO2 23 12/11/2021 1850   BUN 8 12/11/2021 1850   BUN 7 08/02/2020 1538   CREATININE 0.58 12/11/2021 1850   GLUCOSE 97 12/11/2021 1850   CALCIUM 9.1 12/11/2021 1850   CBC:    Component Value Date/Time   WBC 13.8 (H) 12/11/2021 1850   HGB 7.8 (L) 12/11/2021 1850   HGB 7.8 (L) 08/02/2020 1538   HCT 21.7 (L) 12/11/2021 1850   HCT 22.3 (L) 08/02/2020 1538   PLT 566 (H) 12/11/2021 1850   PLT 560 (H) 08/02/2020 1538   MCV 88.2 12/11/2021 1850   MCV 92 08/02/2020 1538   NEUTROABS 9.8 (H) 12/11/2021 1850   NEUTROABS 9.5 (H) 08/02/2020 1538   LYMPHSABS 2.3 12/11/2021 1850   LYMPHSABS 3.0 08/02/2020 1538   MONOABS 1.5 (H) 12/11/2021 1850   EOSABS 0.1 12/11/2021 1850   EOSABS 0.4 08/02/2020 1538   BASOSABS 0.0 12/11/2021 1850   BASOSABS 0.1 08/02/2020 1538    No results found for this or any previous visit (from the past 240 hour(s)).  Studies/Results: No results found.  Medications: Scheduled Meds:  Chlorhexidine Gluconate Cloth  6 each Topical Daily   Deferasirox  1,080 mg Oral QHS   folic acid  1 mg Oral Daily   HYDROmorphone  Intravenous Q4H   ketorolac  15 mg Intravenous Q6H   mirtazapine  45 mg Oral QHS   rivaroxaban  20 mg Oral Q supper   senna-docusate  1 tablet Oral BID   voxelotor  1,500 mg Oral Daily   Continuous Infusions: PRN Meds:.albuterol, diphenhydrAMINE, mometasone-formoterol, naloxone **AND** sodium chloride flush, ondansetron (ZOFRAN) IV, polyethylene glycol, promethazine, sodium chloride flush  Consultants: none  Procedures: none  Antibiotics: none  Assessment/Plan: Principal Problem:   Sickle cell pain crisis ([place]) Active Problems:   Leukocytosis   History of pulmonary embolism    Opioid dependence (HCC)   Mild intermittent asthma without complication  Sickle cell disease with pain crisis: Decrease IV fluids to KVO Continue IV Dilaudid PCA without changes Toradol 15 mg IV every 6 hours for total of 5 days Monitor vital signs very closely, reevaluate pain scale regularly, and supplemental oxygen as needed.  Chronic pain syndrome: Hold home Dilaudid, use PCA instead.  Anemia of chronic disease: Patient's hemoglobin is stable and consistent with her baseline.  There is no clinical indication for blood transfusion at this time.  Continue to follow closely.  Labs in AM.  Mild intermittent asthma: Clinically stable.  Continue home medications as needed.  History of pulmonary embolism: Continue Xarelto Code Status: Full Code Family Communication: N/A Disposition Plan: Not yet ready for discharge  [place], MSN, FNP-C [facility] [address] [place], [place] [address] [phone]  If 7PM-7AM, please contact night-coverage.  12/13/2021, 12:05 PM  LOS: 1 day

## 2021-12-14 DIAGNOSIS — D5700 Hb-SS disease with crisis, unspecified: Secondary | ICD-10-CM | POA: Diagnosis not present

## 2021-12-14 LAB — CBC
HCT: 18.5 % — ABNORMAL LOW (ref 36.0–46.0)
HCT: 21.8 % — ABNORMAL LOW (ref 36.0–46.0)
Hemoglobin: 6.6 g/dL — CL (ref 12.0–15.0)
Hemoglobin: 7.7 g/dL — ABNORMAL LOW (ref 12.0–15.0)
MCH: 31 pg (ref 26.0–34.0)
MCH: 30.7 pg (ref 26.0–34.0)
MCHC: 35.7 g/dL (ref 30.0–36.0)
MCHC: 35.3 g/dL (ref 30.0–36.0)
MCV: 86.9 fL (ref 80.0–100.0)
MCV: 86.9 fL (ref 80.0–100.0)
Platelets: 512 10*3/uL — ABNORMAL HIGH (ref 150–400)
Platelets: 458 10*3/uL — ABNORMAL HIGH (ref 150–400)
RBC: 2.13 MIL/uL — ABNORMAL LOW (ref 3.87–5.11)
RBC: 2.51 MIL/uL — ABNORMAL LOW (ref 3.87–5.11)
RDW: 21.5 % — ABNORMAL HIGH (ref 11.5–15.5)
RDW: 19.9 % — ABNORMAL HIGH (ref 11.5–15.5)
WBC: 17.1 10*3/uL — ABNORMAL HIGH (ref 4.0–10.5)
WBC: 13.7 10*3/uL — ABNORMAL HIGH (ref 4.0–10.5)
nRBC: 0.4 % — ABNORMAL HIGH (ref 0.0–0.2)
nRBC: 0.6 % — ABNORMAL HIGH (ref 0.0–0.2)

## 2021-12-14 LAB — PREPARE RBC (CROSSMATCH)

## 2021-12-14 IMAGING — DX DG CHEST 1V PORT
1 series · 1 of 1 positions shown · non-contrast
Comparison: 01/03/2021

CLINICAL DATA: Center cell crisis

EXAM:
PORTABLE CHEST 1 VIEW

[chest ap]
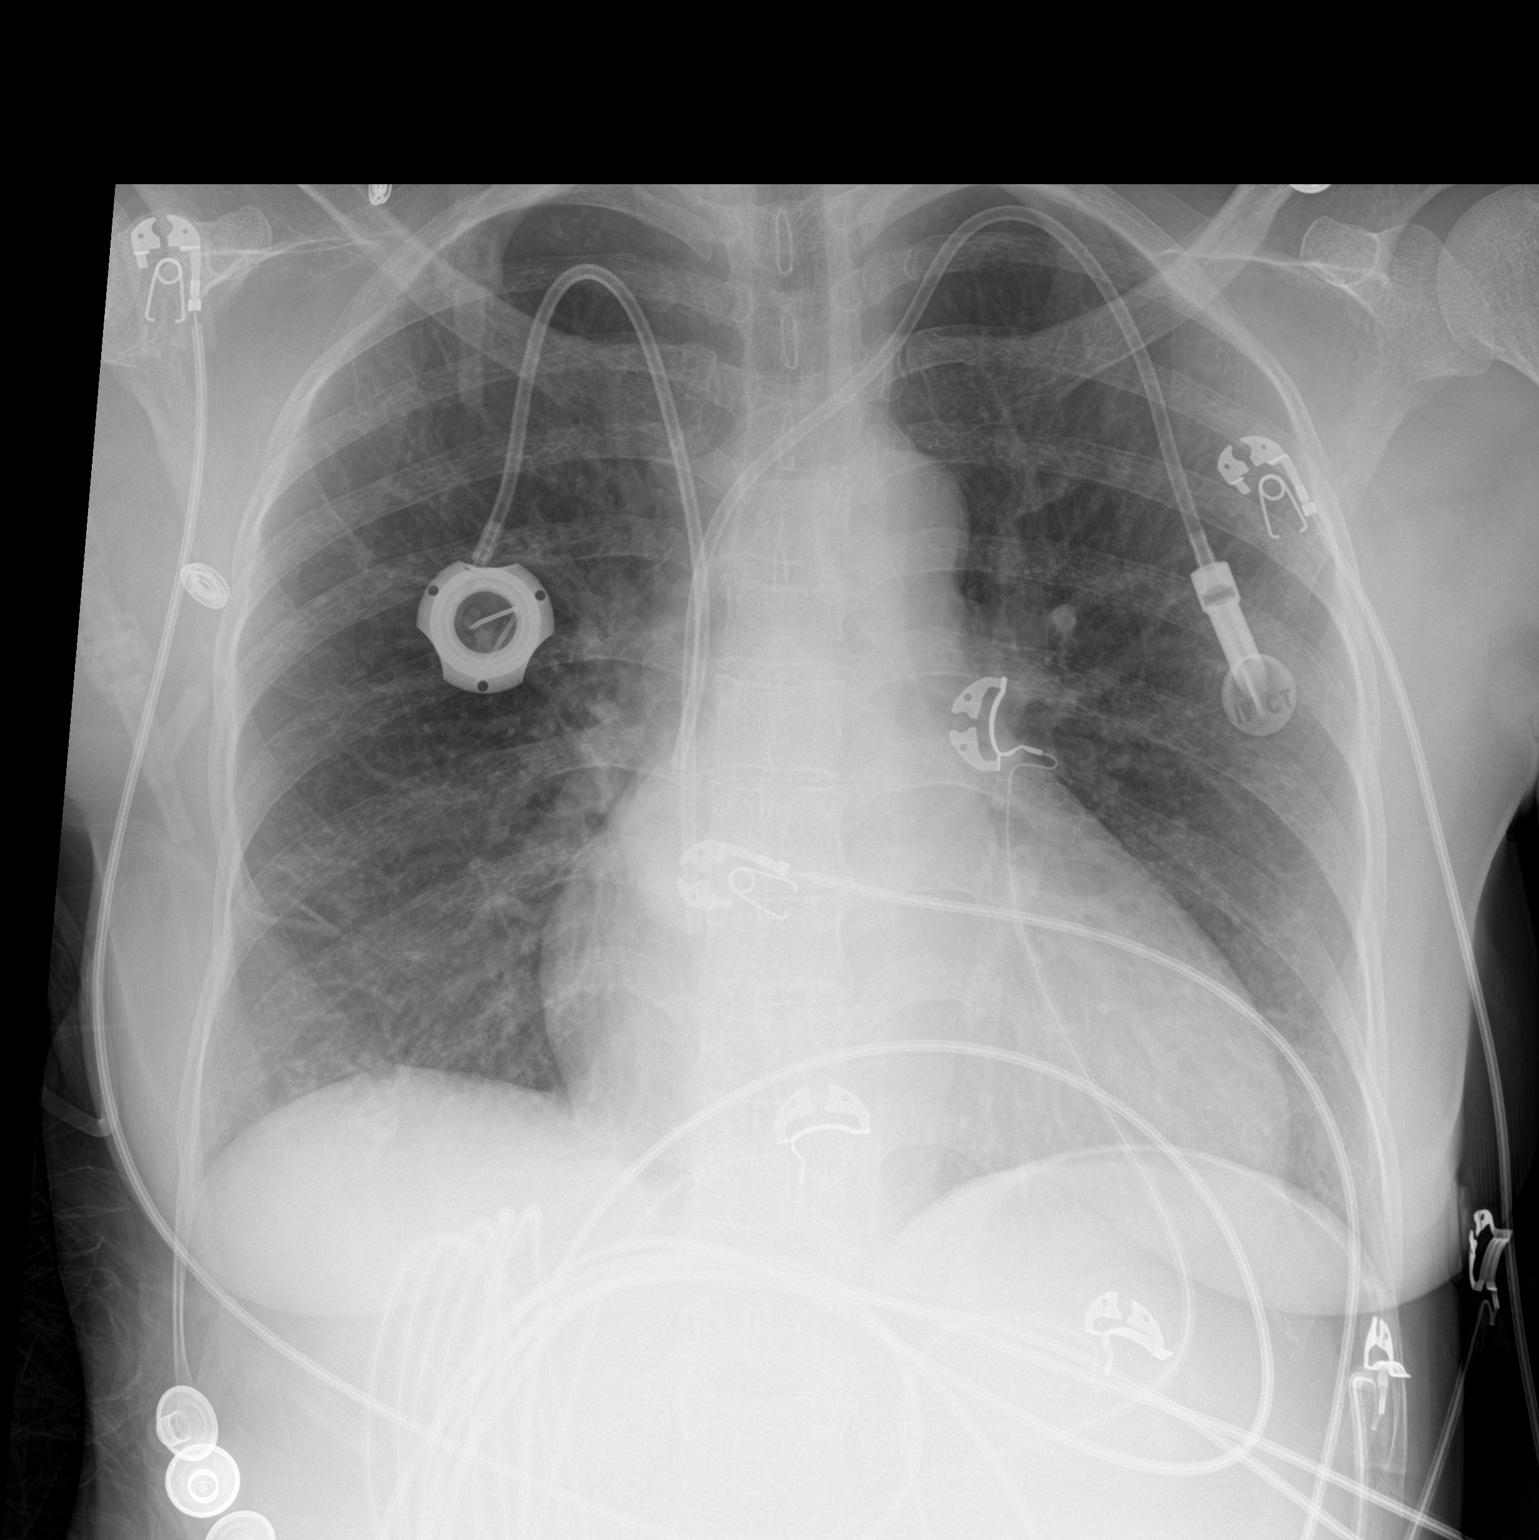

[1 of 1 positions shown; findings below may reference images not displayed]

FINDINGS: Bilateral Port-A-Cath remain in place, unchanged. Cardiac, vascular
congestion. No confluent opacities, effusions or edema. No acute
bony abnormality.
IMPRESSION: Cardiomegaly, vascular congestion.

## 2021-12-14 MED ORDER — ACETAMINOPHEN 325 MG PO TABS
650.0000 mg | ORAL_TABLET | Freq: Once | ORAL | Status: AC
  Administered 2021-12-14: 650 mg via ORAL
  Filled 2021-12-14: qty 2

## 2021-12-14 MED ORDER — HYDROMORPHONE HCL 2 MG/ML IJ SOLN
2.0000 mg | Freq: Four times a day (QID) | INTRAMUSCULAR | Status: DC | PRN
  Administered 2021-12-14 – 2021-12-17 (×10): 2 mg via INTRAVENOUS
  Filled 2021-12-14 (×10): qty 1

## 2021-12-14 MED ORDER — DIPHENHYDRAMINE HCL 50 MG/ML IJ SOLN
25.0000 mg | Freq: Once | INTRAMUSCULAR | Status: AC
  Administered 2021-12-14: 25 mg via INTRAVENOUS
  Filled 2021-12-14: qty 1

## 2021-12-14 MED ORDER — HYDROMORPHONE HCL 4 MG PO TABS
4.0000 mg | ORAL_TABLET | ORAL | Status: DC
  Administered 2021-12-14 – 2021-12-17 (×14): 4 mg via ORAL
  Filled 2021-12-14 (×15): qty 1

## 2021-12-14 MED ORDER — SODIUM CHLORIDE 0.9% IV SOLUTION: Freq: Once | INTRAVENOUS | Status: AC

## 2021-12-14 MED ORDER — HYDROMORPHONE HCL 2 MG/ML IJ SOLN: 2.0000 mg | Freq: Four times a day (QID) | INTRAMUSCULAR | Status: DC | PRN

## 2021-12-14 NOTE — Care Management Important Message (Signed)
Important Message  Patient Details  Name: Molly Gutierrez MRN: [id number] Date of Birth: 04-06-1981   Medicare Important Message Given:  Yes     Memory Argue 12/14/2021, 3:10 PM

## 2021-12-14 NOTE — Progress Notes (Signed)
Date and time results received: 12/14/21 0728   Test: CBC Critical Value: HBG 6.6  Name of Provider Notified: Cammie Sickle, NP  Orders Received? Or Actions Taken?:  New orders for PRBC pending

## 2021-12-14 NOTE — Progress Notes (Signed)
Subjective: Molly Gutierrez is a 41 year old female with a medical history significant for sickle cell disease, chronic pain syndrome, opiate dependence and tolerance, history of PE on Xarelto, history of anemia of chronic disease, and mild intermittent asthma was admitted for sickle cell pain crisis.  Patient continues to complain of low back pain and lower extremity pain.  Pain intensity is 6/10.  Patient denies headache, chest pain, urinary symptoms, vomiting, or diarrhea.   Today, hemoglobin is 6.6 g/dL, which is below baseline.  Objective:  Vital signs in last 24 hours:  Vitals:   12/14/21 1017 12/14/21 1120 12/14/21 1146 12/14/21 1202  BP: 100/60  103/71 109/71  Pulse: 68  89 84  Resp: '15 12 14 14  '$ Temp: 99.5 F (37.5 C)  99.1 F (37.3 C) 99 F (37.2 C)  TempSrc: Oral  Oral Oral  SpO2: 96% 94% 91% 93%  Weight:      Height:        Intake/Output from previous day:   Intake/Output Summary (Last 24 hours) at 12/14/2021 1247 Last data filed at 12/13/2021 1300 Gross per 24 hour  Intake 235 ml  Output --  Net 235 ml    Physical Exam: General: Alert, awake, oriented x3, in no acute distress.  HEENT: [place]/AT [place], EOMI Neck: Trachea midline,  no masses, no thyromegal,y no JVD, no carotid bruit OROPHARYNX:  Moist, No exudate/ erythema/lesions.  Heart: Regular rate and rhythm, without murmurs, rubs, gallops, PMI non-displaced, no heaves or thrills on palpation.  Lungs: Clear to auscultation, no wheezing or rhonchi noted. No increased vocal fremitus resonant to percussion  Abdomen: Soft, nontender, nondistended, positive bowel sounds, no masses no hepatosplenomegaly noted..  Neuro: No focal neurological deficits noted cranial nerves II through XII grossly intact. DTRs 2+ bilaterally upper and lower extremities. Strength 5 out of 5 in bilateral upper and lower extremities. Musculoskeletal: No warm swelling or erythema around joints, no spinal tenderness noted. Psychiatric:  Patient alert and oriented x3, good insight and cognition, good recent to remote recall. Lymph node survey: No cervical axillary or inguinal lymphadenopathy noted.  Lab Results:  Basic Metabolic Panel:    Component Value Date/Time   NA 141 12/11/2021 1850   NA 137 08/02/2020 1538   K 3.8 12/11/2021 1850   CL 111 12/11/2021 1850   CO2 23 12/11/2021 1850   BUN 8 12/11/2021 1850   BUN 7 08/02/2020 1538   CREATININE 0.58 12/11/2021 1850   GLUCOSE 97 12/11/2021 1850   CALCIUM 9.1 12/11/2021 1850   CBC:    Component Value Date/Time   WBC 17.1 (H) 12/14/2021 0541   HGB 6.6 (LL) 12/14/2021 0541   HGB 7.8 (L) 08/02/2020 1538   HCT 18.5 (L) 12/14/2021 0541   HCT 22.3 (L) 08/02/2020 1538   PLT 512 (H) 12/14/2021 0541   PLT 560 (H) 08/02/2020 1538   MCV 86.9 12/14/2021 0541   MCV 92 08/02/2020 1538   NEUTROABS 9.8 (H) 12/11/2021 1850   NEUTROABS 9.5 (H) 08/02/2020 1538   LYMPHSABS 2.3 12/11/2021 1850   LYMPHSABS 3.0 08/02/2020 1538   MONOABS 1.5 (H) 12/11/2021 1850   EOSABS 0.1 12/11/2021 1850   EOSABS 0.4 08/02/2020 1538   BASOSABS 0.0 12/11/2021 1850   BASOSABS 0.1 08/02/2020 1538    No results found for this or any previous visit (from the past 240 hour(s)).  Studies/Results: No results found.  Medications: Scheduled Meds:  Chlorhexidine Gluconate Cloth  6 each Topical Daily   Deferasirox  1,080 mg Oral QHS  folic acid  1 mg Oral Daily   HYDROmorphone  4 mg Oral Q4H while awake   ketorolac  15 mg Intravenous Q6H   mirtazapine  45 mg Oral QHS   rivaroxaban  20 mg Oral Q supper   senna-docusate  1 tablet Oral BID   voxelotor  1,500 mg Oral Daily   Continuous Infusions: PRN Meds:.albuterol, HYDROmorphone (DILAUDID) injection, mometasone-formoterol, polyethylene glycol, sodium chloride flush  Consultants: none  Procedures: none  Antibiotics: none  Assessment/Plan: Principal Problem:   Sickle cell pain crisis ([place]) Active Problems:   Leukocytosis    History of pulmonary embolism   Opioid dependence (HCC)   Mild intermittent asthma without complication  Sickle cell disease with pain crisis: Decrease IV fluids to KVO Discontinue IV Dilaudid PCA Dilaudid 4 mg by mouth every 4 hours while awake Dilaudid 2 mg every 6 hours as needed for severe breakthrough pain Toradol 15 mg IV every 6 hours for total of 5 days Monitor vital signs very closely, reevaluate pain scale regularly, and supplemental oxygen as needed.  Chronic pain syndrome: Restart home medications  Anemia of chronic disease: Patient's hemoglobin is 6.6. Transfuse 1 unit PRBCs.  Continue to follow closely.  Labs in AM.  Mild intermittent asthma: Clinically stable.  Continue home medications as needed.  History of pulmonary embolism: Continue Xarelto  Code Status: Full Code Family Communication: N/A Disposition Plan: Not yet ready for discharge. Discharge planned for 12/15/2021  [name]  APRN, MSN, FNP-C [facility] [place],  [id number] [phone]  If 7PM-7AM, please contact night-coverage.  12/14/2021, 12:47 PM  LOS: 2 days

## 2021-12-15 DIAGNOSIS — D5700 Hb-SS disease with crisis, unspecified: Secondary | ICD-10-CM | POA: Diagnosis not present

## 2021-12-15 LAB — CBC
HCT: 22 % — ABNORMAL LOW (ref 36.0–46.0)
Hemoglobin: 7.9 g/dL — ABNORMAL LOW (ref 12.0–15.0)
MCH: 31.6 pg (ref 26.0–34.0)
MCHC: 35.9 g/dL (ref 30.0–36.0)
MCV: 88 fL (ref 80.0–100.0)
Platelets: 453 10*3/uL — ABNORMAL HIGH (ref 150–400)
RBC: 2.5 MIL/uL — ABNORMAL LOW (ref 3.87–5.11)
RDW: 21 % — ABNORMAL HIGH (ref 11.5–15.5)
WBC: 12.4 10*3/uL — ABNORMAL HIGH (ref 4.0–10.5)
nRBC: 0.6 % — ABNORMAL HIGH (ref 0.0–0.2)

## 2021-12-15 LAB — BASIC METABOLIC PANEL
Anion gap: 4 — ABNORMAL LOW (ref 5–15)
BUN: 12 mg/dL (ref 6–20)
CO2: 26 mmol/L (ref 22–32)
Calcium: 8.1 mg/dL — ABNORMAL LOW (ref 8.9–10.3)
Chloride: 105 mmol/L (ref 98–111)
Creatinine, Ser: 0.56 mg/dL (ref 0.44–1.00)
GFR, Estimated: >60 mL/min (ref >60)
Glucose, Bld: 102 mg/dL — ABNORMAL HIGH (ref 70–99)
Potassium: 3.9 mmol/L (ref 3.5–5.1)
Sodium: 135 mmol/L (ref 135–145)

## 2021-12-15 MED ORDER — HYDROMORPHONE HCL 1 MG/ML IJ SOLN
1.0000 mg | Freq: Once | INTRAMUSCULAR | Status: AC
  Administered 2021-12-15: 1 mg via INTRAVENOUS
  Filled 2021-12-15: qty 1

## 2021-12-15 NOTE — Progress Notes (Signed)
SICKLE CELL SERVICE PROGRESS NOTE  [place] YIR:[id number] DOB: Jun 07, 1981 DOA: 12/11/2021 PCP: Fenton Foy, NP  Assessment/Plan: Principal Problem:   Sickle cell pain crisis ([place]) Active Problems:   Mild intermittent asthma without complication   History of pulmonary embolism   Leukocytosis   Opioid dependence (HCC)  Sickle cell pain crisis: Patient's pain is down to 5 out of 10.  Currently not on Dilaudid PCA.  She is on Dilaudid 4 mg orally while awake and then IV 2 mg every 6 hours for breakthrough pain.  Also on Toradol.  Hemoglobin this staying above 7 g after transfusion yesterday.  We will continue to monitor Anemia of chronic disease: Again hemoglobin is 7.6 after transfusing 1 unit of packed red blood cells yesterday.  Patient will be monitored overnight if hemoglobin remains stable will likely go home. Chronic pain syndrome: Continue oral hydromorphone.  She is not on long-acting at home History of DVT: Continue Xarelto Mild intermittent asthma: Continue breathing treatments as needed  Code Status: Full code Family Communication: No family at bedside Disposition Plan: [place]  Pager [phone]. If 7PM-7AM, please contact night-coverage.  12/15/2021, 12:47 PM  LOS: 3 days   Brief narrative: Molly Gutierrez is a 40 y.o. female with medical history significant of sickle cell disease, chronic pain syndrome, opiate dependence and tolerance, frequent admissions for pain crisis, iron overload, mild intermittent asthma, anemia of chronic disease, history of PE on Xarelto presented to the ED complaining of abdominal and back pain.  Vital signs stable.  Labs showing WBC 13.8, hemoglobin 7.8 (at baseline), platelet count 566k (chronically elevated).  Sodium 141, potassium 3.8, chloride 111, bicarb 23, BUN 8, creatinine 0.5, glucose 97.  T. bili 3.6 (chronically elevated), remainder of LFTs normal.  Lipase normal.  Beta-hCG negative.  UA without signs of  infection. Patient was given multiple doses of IV Dilaudid and IV Toradol 15 mg.   Patient reports 4-day history of lower back pain and epigastric abdominal pain associated with nausea and vomiting.  States she has experienced similar symptoms in the past with sickle cell pain crisis episodes.  Denies fevers, chills, or diarrhea.  States she was able to eat a full sandwich in the ED and has not vomited.  Denies cough, shortness of breath, or chest pain.  States she continues to have pain despite receiving multiple doses of Dilaudid.  Consultants: None  Procedures: None  Antibiotics: None  HPI/Subjective: Patient is doing better with less pain today.  She however has significant weakness.  Hemoglobin staying up after transfusion.  Objective: Vitals:   12/14/21 1757 12/14/21 2208 12/15/21 0206 12/15/21 0519  BP: 96/66 99/68 105/67 100/67  Pulse: 72 80 79 71  Resp: '14 18 16 20  '$ Temp: 98.3 F (36.8 C) 98.3 F (36.8 C) 99 F (37.2 C) 98.8 F (37.1 C)  TempSrc:  Oral Oral Oral  SpO2: 92% 91% 92% 94%  Weight:      Height:       Weight change:   Intake/Output Summary (Last 24 hours) at 12/15/2021 1247 Last data filed at 12/15/2021 1011 Gross per 24 hour  Intake 827.83 ml  Output --  Net 827.83 ml    General: Alert, awake, oriented x3, in no acute distress.  HEENT: [place]/AT [place]ER[place], EOMI Neck: Trachea midline,  no masses, no thyromegal,y no JVD, no carotid bruit OROPHARYNX:  Moist, No exudate/ erythema/lesions.  Heart: Regular rate and rhythm, without murmurs, rubs, gallops, PMI non-displaced, no heaves or thrills  on palpation.  Lungs: Clear to auscultation, no wheezing or rhonchi noted. No increased vocal fremitus resonant to percussion  Abdomen: Soft, nontender, nondistended, positive bowel sounds, no masses no hepatosplenomegaly noted..  Neuro: No focal neurological deficits noted cranial nerves II through XII grossly intact. DTRs 2+ bilaterally upper and lower extremities. Strength  5 out of 5 in bilateral upper and lower extremities. Musculoskeletal: No warm swelling or erythema around joints, no spinal tenderness noted. Psychiatric: Patient alert and oriented x3, good insight and cognition, good recent to remote recall. Lymph node survey: No cervical axillary or inguinal lymphadenopathy noted.   Data Reviewed: Basic Metabolic Panel: Recent Labs  Lab 12/11/21 1850 12/15/21 0651  NA 141 135  K 3.8 3.9  CL 111 105  CO2 23 26  GLUCOSE 97 102*  BUN 8 12  CREATININE 0.58 0.56  CALCIUM 9.1 8.1*   Liver Function Tests: Recent Labs  Lab 12/11/21 1850  AST 30  ALT 12  ALKPHOS 56  BILITOT 3.6*  PROT 8.0  ALBUMIN 4.3   Recent Labs  Lab 12/11/21 1850  LIPASE 33   No results for input(s): "AMMONIA" in the last 168 hours. CBC: Recent Labs  Lab 12/11/21 1850 12/14/21 0541 12/14/21 1637 12/15/21 0651  WBC 13.8* 17.1* 13.7* 12.4*  NEUTROABS 9.8*  --   --   --   HGB 7.8* 6.6* 7.7* 7.9*  HCT 21.7* 18.5* 21.8* 22.0*  MCV 88.2 86.9 86.9 88.0  PLT 566* 512* 458* 453*   Cardiac Enzymes: No results for input(s): "CKTOTAL", "CKMB", "CKMBINDEX", "TROPONINI" in the last 168 hours. BNP (last 3 results) No results for input(s): "BNP" in the last 8760 hours.  ProBNP (last 3 results) No results for input(s): "PROBNP" in the last 8760 hours.  CBG: No results for input(s): "GLUCAP" in the last 168 hours.  No results found for this or any previous visit (from the past 240 hour(s)).   Studies: No results found.  Scheduled Meds:  Chlorhexidine Gluconate Cloth  6 each Topical Daily   Deferasirox  1,080 mg Oral QHS   folic acid  1 mg Oral Daily   HYDROmorphone  4 mg Oral Q4H while awake   ketorolac  15 mg Intravenous Q6H   mirtazapine  45 mg Oral QHS   rivaroxaban  20 mg Oral Q supper   senna-docusate  1 tablet Oral BID   voxelotor  1,500 mg Oral Daily   Continuous Infusions:  Principal Problem:   Sickle cell pain crisis (HCC) Active Problems:    Mild intermittent asthma without complication   History of pulmonary embolism   Leukocytosis   Opioid dependence ([place])

## 2021-12-16 DIAGNOSIS — D5700 Hb-SS disease with crisis, unspecified: Secondary | ICD-10-CM | POA: Diagnosis not present

## 2021-12-16 LAB — PREPARE RBC (CROSSMATCH)

## 2021-12-16 LAB — CBC
HCT: 18.1 % — ABNORMAL LOW (ref 36.0–46.0)
Hemoglobin: 6.2 g/dL — CL (ref 12.0–15.0)
MCH: 31 pg (ref 26.0–34.0)
MCHC: 34.3 g/dL (ref 30.0–36.0)
MCV: 90.5 fL (ref 80.0–100.0)
Platelets: 378 10*3/uL (ref 150–400)
RBC: 2 MIL/uL — ABNORMAL LOW (ref 3.87–5.11)
RDW: 20.8 % — ABNORMAL HIGH (ref 11.5–15.5)
WBC: 9.6 10*3/uL (ref 4.0–10.5)
nRBC: 0.4 % — ABNORMAL HIGH (ref 0.0–0.2)

## 2021-12-16 LAB — HEMOGLOBIN AND HEMATOCRIT, BLOOD
HCT: 25.6 % — ABNORMAL LOW (ref 36.0–46.0)
Hemoglobin: 8.9 g/dL — ABNORMAL LOW (ref 12.0–15.0)

## 2021-12-16 MED ORDER — DIPHENHYDRAMINE HCL 50 MG/ML IJ SOLN
25.0000 mg | INTRAMUSCULAR | Status: AC
  Administered 2021-12-16: 25 mg via INTRAVENOUS

## 2021-12-16 MED ORDER — SODIUM CHLORIDE 0.9% IV SOLUTION: Freq: Once | INTRAVENOUS | Status: AC

## 2021-12-16 MED ORDER — DIPHENHYDRAMINE HCL 50 MG/ML IJ SOLN
INTRAMUSCULAR | Status: AC
  Filled 2021-12-16: qty 1

## 2021-12-16 NOTE — Progress Notes (Signed)
SICKLE CELL SERVICE PROGRESS NOTE  [place] POE:[id number] DOB: 1980-09-15 DOA: 12/11/2021 PCP: Fenton Foy, NP  Assessment/Plan: Principal Problem:   Sickle cell pain crisis ([place]) Active Problems:   Mild intermittent asthma without complication   History of pulmonary embolism   Leukocytosis   Opioid dependence (HCC)  Sickle cell pain crisis: Patient's pain is at 5 out of 10.  Currently not on Dilaudid PCA.  She is on Dilaudid 4 mg orally while awake and then IV 2 mg every 6 hours for breakthrough pain.  Also on Toradol.  Hemoglobin however has dropped down to 6.2 once again from 7.9 yesterday.  We will hold off on discharge and transfuse a unit of packed red blood cells. Anemia of chronic disease: Again hemoglobin is down to 6.2 from 7.9 after transfusing 1 unit of packed red blood cells earlier.  Patient will be transfused a unit of packed red blood cells. Chronic pain syndrome: Continue oral hydromorphone.  She is not on long-acting at home History of DVT: Continue Xarelto Mild intermittent asthma: Continue breathing treatments as needed  Code Status: Full code Family Communication: No family at bedside Disposition Plan: [place]  Pager [phone]. If 7PM-7AM, please contact night-coverage.  12/16/2021, 1:01 PM  LOS: 4 days   Brief narrative: Molly Gutierrez is a 41 y.o. female with medical history significant of sickle cell disease, chronic pain syndrome, opiate dependence and tolerance, frequent admissions for pain crisis, iron overload, mild intermittent asthma, anemia of chronic disease, history of PE on Xarelto presented to the ED complaining of abdominal and back pain.  Vital signs stable.  Labs showing WBC 13.8, hemoglobin 7.8 (at baseline), platelet count 566k (chronically elevated).  Sodium 141, potassium 3.8, chloride 111, bicarb 23, BUN 8, creatinine 0.5, glucose 97.  T. bili 3.6 (chronically elevated), remainder of LFTs normal.  Lipase  normal.  Beta-hCG negative.  UA without signs of infection. Patient was given multiple doses of IV Dilaudid and IV Toradol 15 mg.   Patient reports 4-day history of lower back pain and epigastric abdominal pain associated with nausea and vomiting.  States she has experienced similar symptoms in the past with sickle cell pain crisis episodes.  Denies fevers, chills, or diarrhea.  States she was able to eat a full sandwich in the ED and has not vomited.  Denies cough, shortness of breath, or chest pain.  States she continues to have pain despite receiving multiple doses of Dilaudid.  Consultants: None  Procedures: None  Antibiotics: None  HPI/Subjective: Patient is doing better with less pain today.  She however has significant weakness.  Hemoglobin staying up after transfusion.  Objective: Vitals:   12/16/21 0546 12/16/21 1047 12/16/21 1212 12/16/21 1245  BP: 100/63 110/77 96/61 102/66  Pulse: 66 75 75 82  Resp: 18 20    Temp: 98.5 F (36.9 C) 98.6 F (37 C) 98.7 F (37.1 C) 98.9 F (37.2 C)  TempSrc: Oral Oral Oral Oral  SpO2: 93% 96% 95% 94%  Weight:      Height:       Weight change:   Intake/Output Summary (Last 24 hours) at 12/16/2021 1301 Last data filed at 12/15/2021 2130 Gross per 24 hour  Intake 250 ml  Output --  Net 250 ml     General: Alert, awake, oriented x3, in no acute distress.  HEENT: [place]/AT [place]ER[place], EOMI Neck: Trachea midline,  no masses, no thyromegal,y no JVD, no carotid bruit OROPHARYNX:  Moist, No exudate/ erythema/lesions.  Heart: Regular rate and rhythm, without murmurs, rubs, gallops, PMI non-displaced, no heaves or thrills on palpation.  Lungs: Clear to auscultation, no wheezing or rhonchi noted. No increased vocal fremitus resonant to percussion  Abdomen: Soft, nontender, nondistended, positive bowel sounds, no masses no hepatosplenomegaly noted..  Neuro: No focal neurological deficits noted cranial nerves II through XII grossly intact. DTRs 2+  bilaterally upper and lower extremities. Strength 5 out of 5 in bilateral upper and lower extremities. Musculoskeletal: No warm swelling or erythema around joints, no spinal tenderness noted. Psychiatric: Patient alert and oriented x3, good insight and cognition, good recent to remote recall. Lymph node survey: No cervical axillary or inguinal lymphadenopathy noted.   Data Reviewed: Basic Metabolic Panel: Recent Labs  Lab 12/11/21 1850 12/15/21 0651  NA 141 135  K 3.8 3.9  CL 111 105  CO2 23 26  GLUCOSE 97 102*  BUN 8 12  CREATININE 0.58 0.56  CALCIUM 9.1 8.1*    Liver Function Tests: Recent Labs  Lab 12/11/21 1850  AST 30  ALT 12  ALKPHOS 56  BILITOT 3.6*  PROT 8.0  ALBUMIN 4.3    Recent Labs  Lab 12/11/21 1850  LIPASE 33    No results for input(s): "AMMONIA" in the last 168 hours. CBC: Recent Labs  Lab 12/11/21 1850 12/14/21 0541 12/14/21 1637 12/15/21 0651 12/16/21 0950  WBC 13.8* 17.1* 13.7* 12.4* 9.6  NEUTROABS 9.8*  --   --   --   --   HGB 7.8* 6.6* 7.7* 7.9* 6.2*  HCT 21.7* 18.5* 21.8* 22.0* 18.1*  MCV 88.2 86.9 86.9 88.0 90.5  PLT 566* 512* 458* 453* 378    Cardiac Enzymes: No results for input(s): "CKTOTAL", "CKMB", "CKMBINDEX", "TROPONINI" in the last 168 hours. BNP (last 3 results) No results for input(s): "BNP" in the last 8760 hours.  ProBNP (last 3 results) No results for input(s): "PROBNP" in the last 8760 hours.  CBG: No results for input(s): "GLUCAP" in the last 168 hours.  No results found for this or any previous visit (from the past 240 hour(s)).   Studies: No results found.  Scheduled Meds:  sodium chloride   Intravenous Once   Chlorhexidine Gluconate Cloth  6 each Topical Daily   Deferasirox  1,080 mg Oral QHS   diphenhydrAMINE       folic acid  1 mg Oral Daily   HYDROmorphone  4 mg Oral Q4H while awake   ketorolac  15 mg Intravenous Q6H   mirtazapine  45 mg Oral QHS   rivaroxaban  20 mg Oral Q supper    senna-docusate  1 tablet Oral BID   voxelotor  1,500 mg Oral Daily   Continuous Infusions:  Principal Problem:   Sickle cell pain crisis ([place]) Active Problems:   Mild intermittent asthma without complication   History of pulmonary embolism   Leukocytosis   Opioid dependence ([place])

## 2021-12-17 DIAGNOSIS — D5700 Hb-SS disease with crisis, unspecified: Secondary | ICD-10-CM | POA: Diagnosis not present

## 2021-12-17 LAB — BPAM RBC
Blood Product Expiration Date: 202307302359
Blood Product Expiration Date: 202307312359
ISSUE DATE / TIME: 202306301139
ISSUE DATE / TIME: 202307021221
Unit Type and Rh: 5100
Unit Type and Rh: 5100

## 2021-12-17 LAB — TYPE AND SCREEN
ABO/RH(D): A POS
Antibody Screen: NEGATIVE
Unit division: 0
Unit division: 0

## 2021-12-17 MED ORDER — HEPARIN SOD (PORK) LOCK FLUSH 100 UNIT/ML IV SOLN
500.0000 [IU] | INTRAVENOUS | Status: AC | PRN
  Administered 2021-12-17: 500 [IU]

## 2021-12-17 MED ORDER — HYDROMORPHONE HCL 4 MG PO TABS: 4.0000 mg | ORAL_TABLET | Freq: Four times a day (QID) | ORAL | 0 refills | Status: DC | PRN

## 2021-12-17 MED ORDER — HEPARIN SOD (PORK) LOCK FLUSH 100 UNIT/ML IV SOLN: 500.0000 [IU] | Freq: Once | INTRAVENOUS | Status: AC

## 2021-12-17 NOTE — Care Management Important Message (Signed)
Important Message  Patient Details IM Letter given to the Patient. Name: Keiva Dina MRN: [id number] Date of Birth: [birth year]   Medicare Important Message Given:  Yes     Kerin Salen 12/17/2021, 10:36 AM

## 2021-12-17 NOTE — Discharge Summary (Signed)
Physician Discharge Summary  [facility] NID:[id number] DOB: [birth year] DOA: 12/11/2021  PCP: Fenton Foy, NP  Admit date: 12/11/2021  Discharge date: 12/17/2021  Discharge Diagnoses:  Principal Problem:   Sickle cell pain crisis ([place]) Active Problems:   Leukocytosis   History of pulmonary embolism   Opioid dependence ([place])   Mild intermittent asthma without complication   Discharge Condition: Stable  Disposition:   Follow-up Information     Fenton Foy, NP Follow up.   Specialty: Pulmonary Disease Contact information: [address], [place] [address] [phone]                Pt is discharged home in good condition and is to follow up with Fenton Foy, NP this week to have labs evaluated. Mandee [place] is instructed to increase activity slowly and balance with rest for the next few days, and use prescribed medication to complete treatment of pain  Diet: Regular Wt Readings from Last 3 Encounters:  12/12/21 54.7 kg  11/11/21 49.9 kg  11/01/21 48.5 kg    History of present illness:  Molly Gutierrez is a 41 year old female with a medical history significant for sickle cell disease, chronic pain syndrome, opiate dependence and tolerance, history of PE on Xarelto, and history of mild intermittent asthma with frequent admissions for sickle cell pain crisis presented to the ED complaining of abdominal and back pain.  Vital signs stable.  Labs showing WBCs 13.8, hemoglobin 7.8 (at baseline), platelet count 566,000 (chronically elevated).  Sodium 141, potassium 3.8, chloride 111, bicarb 23, BUN 8, creatinine 0.5, glucose 97.  T. bili 3.6 (chronically elevated), remainder of LFTs normal.  Lipase normal.  Beta-hCG negative.  UA without signs of infection.  Patient was given multiple doses of IV Dilaudid and IV Toradol 15 mg. Patient reported a 4-day history of low back pain and epigastric abdominal pain associated with nausea  and vomiting.  She states that she experienced similar symptoms in the past with sickle cell pain crisis episodes.  Denies fever, chills, or diarrhea.  States she was able to eat a full sandwich in the emergency department and has not vomited.  Denies cough, shortness of breath, or chest pain.  States she continues to have pain despite receiving multiple doses of Dilaudid.  Hospital Course:  Sickle cell disease with pain crisis: Patient was admitted for sickle cell pain crisis and managed appropriately with IVF, IV Dilaudid via PCA and IV Toradol, as well as other adjunct therapies per sickle cell pain management protocols.  IV Dilaudid PCA was weaned appropriately and patient transition to her home medications. Patient is requesting home medication regimen.  Dilaudid 4 mg p.o. #60 was sent to patient's pharmacy.  PDMP substance reporting system was reviewed prior to prescribing opiate medications, no inconsistencies noted.  Patient's pain intensity is 5/10 and she will be discharged home today.  Anemia of chronic disease: Patient's hemoglobin decreased to 6.2 g/dL.  She was transfused 1 unit PRBCs.  Prior to discharge hemoglobin 8.9 g/dL, which is slightly above her baseline.  Patient advised to follow-up with her hematology team at [facility].  She is also on Oxbryta, she did not bring medication to hospital for continuation.  Discussed the importance of following medication regimens consistently in order to achieve positive outcomes. This patient may also benefit from adding Adakveo infusions.  Patient will resume all home medications.  She will follow-up with her PCP in 1 week for medication management and to repeat CBC  with differential, CMP, and reticulocytes.  Patient is alert, oriented, and ambulating without assistance. Patient was therefore discharged home today in a hemodynamically stable condition.   Reet will follow-up with PCP within 1 week of this discharge. Siana was counseled  extensively about nonpharmacologic means of pain management, patient verbalized understanding and was appreciative of  the care received during this admission.   We discussed the need for good hydration, monitoring of hydration status, avoidance of heat, cold, stress, and infection triggers. We discussed the need to be adherent with taking Oxbryta and other home medications. Patient was reminded of the need to seek medical attention immediately if any symptom of bleeding, anemia, or infection occurs.  Discharge Exam: Vitals:   12/16/21 2050 12/17/21 0021  BP: 115/81 107/87  Pulse: 81 67  Resp: 16 15  Temp: 99.1 F (37.3 C) 98.9 F (37.2 C)  SpO2: 97% 96%   Vitals:   12/16/21 1245 12/16/21 1547 12/16/21 2050 12/17/21 0021  BP: 102/66 103/71 115/81 107/87  Pulse: 82 73 81 67  Resp:  _0 Temp: 98.9 F (37.2 C) 99 F (37.2 C) 99.1 F (37.3 C) 98.9 F (37.2 C)  TempSrc: Oral Oral Oral Oral  SpO2: 94% 96% 97% 96%  Weight:      Height:        General appearance : Awake, alert, not in any distress. Speech Clear. Not toxic looking HEENT: Atraumatic and Normocephalic, pupils equally reactive to light and accomodation Neck: Supple, no JVD. No cervical lymphadenopathy.  Chest: Good air entry bilaterally, no added sounds  CVS: S1 S2 regular, no murmurs.  Abdomen: Bowel sounds present, Non tender and not distended with no gaurding, rigidity or rebound. Extremities: B/L Lower Ext shows no edema, both legs are warm to touch Neurology: Awake alert, and oriented X 3, CN II-XII intact, Non focal Skin: No Rash  Discharge Instructions  Discharge Instructions     Discharge patient   Complete by: As directed    Discharge disposition: 01-Home or Self Care   Discharge patient date: 12/17/2021      Allergies as of 12/17/2021       Reactions   Cefaclor Hives, Swelling   Hydroxyurea Palpitations, Other (See Comments)   Lowers "blood levels" and heart rate (causes HYPOtension); "it  messes me up, it drops my levels and stuff"   Omeprazole Other (See Comments)   Causes "sharp pains in the stomach"   Ketamine Palpitations, Other (See Comments)   "Pt states she has had previous reaction to ketamine. States she becomes flushed, heart races, dizzy, and feels like she is going to pass out."   Tomato Itching, Rash   "Break out"        Medication List     TAKE these medications    albuterol 108 (90 Base) MCG/ACT inhaler Commonly known as: VENTOLIN HFA Inhale 2 puffs into the lungs every 6 (six) hours as needed for wheezing or shortness of breath.   cholecalciferol 25 MCG (1000 UNIT) tablet Commonly known as: VITAMIN D3 Take 1,000 Units by mouth daily.   Deferasirox 360 MG Tabs Take 1,080 mg by mouth at bedtime.   diphenhydrAMINE 25 mg capsule Commonly known as: BENADRYL Take 25 mg by mouth 3 (three) times daily as needed for itching.   fluticasone 50 MCG/ACT nasal spray Commonly known as: FLONASE Place 1 spray into both nostrils daily as needed for allergies or rhinitis.   folic acid 1 MG tablet Commonly known as: FOLVITE Take  1 mg by mouth at bedtime.   HYDROmorphone 4 MG tablet Commonly known as: Dilaudid Take 1 tablet (4 mg total) by mouth every 6 (six) hours as needed for up to 15 days for severe pain.   mirtazapine 45 MG tablet Commonly known as: REMERON Take 1 tablet (45 mg total) by mouth at bedtime.   mometasone-formoterol 100-5 MCG/ACT Aero Commonly known as: DULERA Inhale 2 puffs into the lungs daily as needed for wheezing or shortness of breath.   naloxone 4 MG/0.1ML Liqd nasal spray kit Commonly known as: NARCAN Place 0.4 mg into the nose once as needed.   omeprazole 20 MG capsule Commonly known as: PRILOSEC Take 1 capsule (20 mg total) by mouth 2 (two) times daily before a meal. What changed:  when to take this reasons to take this   Oxbryta 500 MG Tabs tablet Generic drug: voxelotor Take 1,500 mg by mouth at bedtime.    polyethylene glycol 17 g packet Commonly known as: MIRALAX / GLYCOLAX Take 17 g by mouth daily as needed for mild constipation.   promethazine 25 MG tablet Commonly known as: PHENERGAN Take 0.5-1 tablets (12.5-25 mg total) by mouth every 6 (six) hours as needed for nausea or vomiting. What changed: how much to take   VITAMIN B-12 PO Take 1 tablet by mouth daily.   Xarelto 20 MG Tabs tablet Generic drug: rivaroxaban Take 1 tablet (20 mg total) by mouth daily with supper.        The results of significant diagnostics from this hospitalization (including imaging, microbiology, ancillary and laboratory) are listed below for reference.    Significant Diagnostic Studies: No results found.  Microbiology: No results found for this or any previous visit (from the past 240 hour(s)).   Labs: Basic Metabolic Panel: Recent Labs  Lab 12/11/21 1850 12/15/21 0651  NA 141 135  K 3.8 3.9  CL 111 105  CO2 23 26  GLUCOSE 97 102*  BUN 8 12  CREATININE 0.58 0.56  CALCIUM 9.1 8.1*   Liver Function Tests: Recent Labs  Lab 12/11/21 1850  AST 30  ALT 12  ALKPHOS 56  BILITOT 3.6*  PROT 8.0  ALBUMIN 4.3   Recent Labs  Lab 12/11/21 1850  LIPASE 33   No results for input(s): "AMMONIA" in the last 168 hours. CBC: Recent Labs  Lab 12/11/21 1850 12/14/21 0541 12/14/21 1637 12/15/21 0651 12/16/21 0950 12/16/21 1920  WBC 13.8* 17.1* 13.7* 12.4* 9.6  --   NEUTROABS 9.8*  --   --   --   --   --   HGB 7.8* 6.6* 7.7* 7.9* 6.2* 8.9*  HCT 21.7* 18.5* 21.8* 22.0* 18.1* 25.6*  MCV 88.2 86.9 86.9 88.0 90.5  --   PLT 566* 512* 458* 453* 378  --    Cardiac Enzymes: No results for input(s): "CKTOTAL", "CKMB", "CKMBINDEX", "TROPONINI" in the last 168 hours. BNP: Invalid input(s): "POCBNP" CBG: No results for input(s): "GLUCAP" in the last 168 hours.  Time coordinating discharge: 40 minutes  Signed: [name]  APRN, MSN, FNP-C Patient [facility] [address] [place], [place] [address] [phone]  [facility] 12/17/2021, 9:59 AM

## 2021-12-19 ENCOUNTER — Other Ambulatory Visit: Payer: Self-pay | Admitting: Nurse Practitioner

## 2021-12-20 ENCOUNTER — Telehealth: Payer: Self-pay

## 2021-12-20 NOTE — Telephone Encounter (Signed)
Transition Care Management Follow-up Telephone Call Date of discharge and from where: Molly Gutierrez 12/11/21-12/17/21 How have you been since you were released from the hospital? "I am doing fine, my pain is under control now". Any questions or concerns? No  Items Reviewed: Did the pt receive and understand the discharge instructions provided? Yes  Medications obtained and verified? Yes  Other? No  Any new allergies since your discharge? No  Dietary orders reviewed? Yes Do you have support at home? Yes   Home Care and Equipment/Supplies: Were home health services ordered? no If so, what is the name of the agency? N/A  Has the agency set up a time to come to the patient's home? not applicable Were any new equipment or medical supplies ordered?  No What is the name of the medical supply agency? N/A Were you able to get the supplies/equipment? not applicable Do you have any questions related to the use of the equipment or supplies? No  Functional Questionnaire: (I = Independent and D = Dependent) ADLs: I  Bathing/Dressing- I  Meal Prep- I  Eating- I  Maintaining continence- I  Transferring/Ambulation- I  Managing Meds- I  Follow up appointments reviewed:  PCP Hospital f/u appt confirmed? Yes  Scheduled to see Lazaro Arms, NP on 01/28/22 @ 01:40. [facility] f/u appt confirmed? No   Are transportation arrangements needed? No  If their condition worsens, is the pt aware to call PCP or go to the Emergency Dept.? Yes Was the patient provided with contact information for the PCP's office or ED? Yes Was to pt encouraged to call back with questions or concerns? Yes

## 2021-12-26 ENCOUNTER — Telehealth: Payer: Self-pay

## 2021-12-26 ENCOUNTER — Other Ambulatory Visit: Payer: Self-pay | Admitting: Family Medicine

## 2021-12-26 DIAGNOSIS — D5700 Hb-SS disease with crisis, unspecified: Secondary | ICD-10-CM

## 2021-12-26 MED ORDER — HYDROMORPHONE HCL 4 MG PO TABS: 4.0000 mg | ORAL_TABLET | Freq: Four times a day (QID) | ORAL | 0 refills | Status: DC | PRN

## 2021-12-26 MED ORDER — FLUTICASONE PROPIONATE 50 MCG/ACT NA SUSP: 1.0000 | Freq: Every day | NASAL | 0 refills | Status: DC | PRN

## 2021-12-26 NOTE — Progress Notes (Signed)
Reviewed PDMP substance reporting system prior to prescribing opiate medications. No inconsistencies noted.  Meds ordered this encounter  Medications   HYDROmorphone (DILAUDID) 4 MG tablet    Sig: Take 1 tablet (4 mg total) by mouth every 6 (six) hours as needed for up to 15 days for severe pain.    Dispense:  60 tablet    Refill:  0    Order Specific Question:   Supervising Provider    Answer:   [name] [[id number]]   [name]  APRN, MSN, FNP-C Patient [facility] [address] [place], [place] [address] [phone]

## 2021-12-26 NOTE — Telephone Encounter (Signed)
Hydromorphone Morphine

## 2021-12-28 NOTE — Telephone Encounter (Signed)
No additional notes needed  

## 2021-12-31 ENCOUNTER — Telehealth: Payer: Self-pay

## 2021-12-31 DIAGNOSIS — D5700 Hb-SS disease with crisis, unspecified: Secondary | ICD-10-CM

## 2021-12-31 MED ORDER — FLUTICASONE PROPIONATE 50 MCG/ACT NA SUSP: 1.0000 | Freq: Every day | NASAL | 0 refills | Status: AC | PRN

## 2021-12-31 NOTE — Telephone Encounter (Signed)
No additional notes needed  

## 2022-01-07 ENCOUNTER — Telehealth: Payer: Self-pay

## 2022-01-07 NOTE — Telephone Encounter (Signed)
Hydromorphone  

## 2022-01-10 ENCOUNTER — Other Ambulatory Visit: Payer: Self-pay | Admitting: Family Medicine

## 2022-01-10 DIAGNOSIS — D5700 Hb-SS disease with crisis, unspecified: Secondary | ICD-10-CM

## 2022-01-10 MED ORDER — HYDROMORPHONE HCL 4 MG PO TABS: 4.0000 mg | ORAL_TABLET | Freq: Four times a day (QID) | ORAL | 0 refills | Status: DC | PRN

## 2022-01-10 NOTE — Progress Notes (Signed)
Reviewed PDMP substance reporting system prior to prescribing opiate medications. No inconsistencies noted.  Meds ordered this encounter  Medications   HYDROmorphone (DILAUDID) 4 MG tablet    Sig: Take 1 tablet (4 mg total) by mouth every 6 (six) hours as needed for up to 15 days for severe pain.    Dispense:  60 tablet    Refill:  0    Order Specific Question:   Supervising Provider    Answer:   [name] [[id number]]   [name]  APRN, MSN, FNP-C Patient [facility] [address] [place], [place] [address] [phone]

## 2022-01-22 ENCOUNTER — Other Ambulatory Visit: Payer: Self-pay | Admitting: Family Medicine

## 2022-01-22 DIAGNOSIS — D5700 Hb-SS disease with crisis, unspecified: Secondary | ICD-10-CM

## 2022-01-22 MED ORDER — HYDROMORPHONE HCL 4 MG PO TABS: 4.0000 mg | ORAL_TABLET | Freq: Four times a day (QID) | ORAL | 0 refills | Status: DC | PRN

## 2022-01-22 NOTE — Progress Notes (Signed)
Reviewed PDMP substance reporting system prior to prescribing opiate medications. No inconsistencies noted.    Meds ordered this encounter  Medications   HYDROmorphone (DILAUDID) 4 MG tablet    Sig: Take 1 tablet (4 mg total) by mouth every 6 (six) hours as needed for up to 15 days for severe pain.    Dispense:  60 tablet    Refill:  0    Order Specific Question:   Supervising Provider    Answer:   [name] [[id number]]     [name]  APRN, MSN, FNP-C [name] [name] [place], [place] [address] [phone]

## 2022-01-28 ENCOUNTER — Ambulatory Visit: Payer: Medicare HMO | Admitting: Nurse Practitioner

## 2022-02-01 ENCOUNTER — Ambulatory Visit: Payer: Medicare HMO | Admitting: Nurse Practitioner

## 2022-02-04 ENCOUNTER — Emergency Department (HOSPITAL_COMMUNITY): Payer: Medicare HMO

## 2022-02-04 ENCOUNTER — Encounter (HOSPITAL_COMMUNITY): Payer: Self-pay

## 2022-02-04 ENCOUNTER — Inpatient Hospital Stay (HOSPITAL_COMMUNITY)
Admission: EM | Admit: 2022-02-04 | Discharge: 2022-02-09 | DRG: 812 | Disposition: A | Discharge status: Home / Self Care | Payer: Medicare HMO | Attending: Internal Medicine | Admitting: Internal Medicine

## 2022-02-04 ENCOUNTER — Other Ambulatory Visit: Payer: Self-pay

## 2022-02-04 DIAGNOSIS — Z79899 Other long term (current) drug therapy: Secondary | ICD-10-CM

## 2022-02-04 DIAGNOSIS — E876 Hypokalemia: Secondary | ICD-10-CM | POA: Diagnosis present

## 2022-02-04 DIAGNOSIS — Z888 Allergy status to other drugs, medicaments and biological substances status: Secondary | ICD-10-CM

## 2022-02-04 DIAGNOSIS — Z91018 Allergy to other foods: Secondary | ICD-10-CM

## 2022-02-04 DIAGNOSIS — D75839 Thrombocytosis, unspecified: Secondary | ICD-10-CM | POA: Diagnosis present

## 2022-02-04 DIAGNOSIS — J45909 Unspecified asthma, uncomplicated: Secondary | ICD-10-CM | POA: Diagnosis present

## 2022-02-04 DIAGNOSIS — D649 Anemia, unspecified: Secondary | ICD-10-CM | POA: Diagnosis present

## 2022-02-04 DIAGNOSIS — D5700 Hb-SS disease with crisis, unspecified: Principal | ICD-10-CM | POA: Diagnosis present

## 2022-02-04 DIAGNOSIS — Z7901 Long term (current) use of anticoagulants: Secondary | ICD-10-CM

## 2022-02-04 DIAGNOSIS — Z7951 Long term (current) use of inhaled steroids: Secondary | ICD-10-CM

## 2022-02-04 DIAGNOSIS — Z20822 Contact with and (suspected) exposure to covid-19: Secondary | ICD-10-CM | POA: Diagnosis present

## 2022-02-04 DIAGNOSIS — D638 Anemia in other chronic diseases classified elsewhere: Secondary | ICD-10-CM | POA: Diagnosis present

## 2022-02-04 DIAGNOSIS — Z9049 Acquired absence of other specified parts of digestive tract: Secondary | ICD-10-CM

## 2022-02-04 DIAGNOSIS — Z86711 Personal history of pulmonary embolism: Secondary | ICD-10-CM | POA: Diagnosis present

## 2022-02-04 DIAGNOSIS — K219 Gastro-esophageal reflux disease without esophagitis: Secondary | ICD-10-CM | POA: Diagnosis present

## 2022-02-04 DIAGNOSIS — R112 Nausea with vomiting, unspecified: Secondary | ICD-10-CM

## 2022-02-04 DIAGNOSIS — Z881 Allergy status to other antibiotic agents status: Secondary | ICD-10-CM

## 2022-02-04 LAB — CBC
HCT: 25.9 % — ABNORMAL LOW (ref 36.0–46.0)
Hemoglobin: 8.7 g/dL — ABNORMAL LOW (ref 12.0–15.0)
MCH: 30 pg (ref 26.0–34.0)
MCHC: 33.6 g/dL (ref 30.0–36.0)
MCV: 89.3 fL (ref 80.0–100.0)
Platelets: 629 10*3/uL — ABNORMAL HIGH (ref 150–400)
RBC: 2.9 MIL/uL — ABNORMAL LOW (ref 3.87–5.11)
RDW: 19.1 % — ABNORMAL HIGH (ref 11.5–15.5)
WBC: 14.2 10*3/uL — ABNORMAL HIGH (ref 4.0–10.5)
nRBC: 0.4 % — ABNORMAL HIGH (ref 0.0–0.2)

## 2022-02-04 LAB — BASIC METABOLIC PANEL
Anion gap: 6 (ref 5–15)
BUN: 7 mg/dL (ref 6–20)
CO2: 24 mmol/L (ref 22–32)
Calcium: 9 mg/dL (ref 8.9–10.3)
Chloride: 109 mmol/L (ref 98–111)
Creatinine, Ser: 0.56 mg/dL (ref 0.44–1.00)
GFR, Estimated: >60 mL/min (ref >60)
Glucose, Bld: 95 mg/dL (ref 70–99)
Potassium: 3.4 mmol/L — ABNORMAL LOW (ref 3.5–5.1)
Sodium: 139 mmol/L (ref 135–145)

## 2022-02-04 LAB — RETICULOCYTES
Immature Retic Fract: 19.8 % — ABNORMAL HIGH (ref 2.3–15.9)
RBC.: 2.9 MIL/uL — ABNORMAL LOW (ref 3.87–5.11)
Retic Count, Absolute: 334.5 10*3/uL — ABNORMAL HIGH (ref 19.0–186.0)
Retic Ct Pct: 11.7 % — ABNORMAL HIGH (ref 0.4–3.1)

## 2022-02-04 LAB — I-STAT BETA HCG BLOOD, ED (MC, WL, AP ONLY): I-stat hCG, quantitative: <5 m[IU]/mL (ref <5)

## 2022-02-04 LAB — TROPONIN I (HIGH SENSITIVITY)
Troponin I (High Sensitivity): 3 ng/L (ref <18)
Troponin I (High Sensitivity): 2 ng/L (ref <18)

## 2022-02-04 LAB — RESP PANEL BY RT-PCR (FLU A&B, COVID) ARPGX2
Influenza A by PCR: NEGATIVE
Influenza B by PCR: NEGATIVE
SARS Coronavirus 2 by RT PCR: NEGATIVE

## 2022-02-04 MED ORDER — HYDROMORPHONE HCL 2 MG/ML IJ SOLN
2.0000 mg | Freq: Once | INTRAMUSCULAR | Status: AC
  Administered 2022-02-04: 2 mg via INTRAVENOUS
  Filled 2022-02-04: qty 1

## 2022-02-04 MED ORDER — LACTATED RINGERS IV BOLUS
1000.0000 mL | Freq: Once | INTRAVENOUS | Status: AC
  Administered 2022-02-04: 1000 mL via INTRAVENOUS

## 2022-02-04 MED ORDER — HYDROMORPHONE HCL 2 MG/ML IJ SOLN
1.0000 mg | Freq: Once | INTRAMUSCULAR | Status: AC
  Administered 2022-02-04: 1 mg via INTRAVENOUS
  Filled 2022-02-04: qty 1

## 2022-02-04 MED ORDER — OXYCODONE-ACETAMINOPHEN 5-325 MG PO TABS: 1.0000 | ORAL_TABLET | Freq: Once | ORAL | Status: DC

## 2022-02-04 MED ORDER — OXYCODONE-ACETAMINOPHEN 5-325 MG PO TABS
1.0000 | ORAL_TABLET | Freq: Once | ORAL | Status: AC
  Administered 2022-02-04: 1 via ORAL
  Filled 2022-02-04: qty 1

## 2022-02-04 NOTE — ED Provider Notes (Signed)
11:42 PM Patient reassessed. States her pain is still uncontrolled and she feels she needs admission for further management of her sickle cell pain. VSS. Consult placed to hospitalist.  12:25 AM Case discussed with Dr. Marlowe Sax of [facility] who will admit.   Results for orders placed or performed during the hospital encounter of 02/04/22  Resp Panel by RT-PCR (Flu A&B, Covid) Anterior Nasal Swab   Specimen: Anterior Nasal Swab  Result Value Ref Range   SARS Coronavirus 2 by RT PCR NEGATIVE NEGATIVE   Influenza A by PCR NEGATIVE NEGATIVE   Influenza B by PCR NEGATIVE NEGATIVE  Basic metabolic panel  Result Value Ref Range   Sodium 139 135 - 145 mmol/L   Potassium 3.4 (L) 3.5 - 5.1 mmol/L   Chloride 109 98 - 111 mmol/L   CO2 24 22 - 32 mmol/L   Glucose, Bld 95 70 - 99 mg/dL   BUN 7 6 - 20 mg/dL   Creatinine, Ser 0.56 0.44 - 1.00 mg/dL   Calcium 9.0 8.9 - 10.3 mg/dL   GFR, Estimated >60 >60 mL/min   Anion gap 6 5 - 15  CBC  Result Value Ref Range   WBC 14.2 (H) 4.0 - 10.5 K/uL   RBC 2.90 (L) 3.87 - 5.11 MIL/uL   Hemoglobin 8.7 (L) 12.0 - 15.0 g/dL   HCT 25.9 (L) 36.0 - 46.0 %   MCV 89.3 80.0 - 100.0 fL   MCH 30.0 26.0 - 34.0 pg   MCHC 33.6 30.0 - 36.0 g/dL   RDW 19.1 (H) 11.5 - 15.5 %   Platelets 629 (H) 150 - 400 K/uL   nRBC 0.4 (H) 0.0 - 0.2 %  Reticulocytes  Result Value Ref Range   Retic Ct Pct 11.7 (H) 0.4 - 3.1 %   RBC. 2.90 (L) 3.87 - 5.11 MIL/uL   Retic Count, Absolute 334.5 (H) 19.0 - 186.0 K/uL   Immature Retic Fract 19.8 (H) 2.3 - 15.9 %  I-Stat beta hCG blood, ED  Result Value Ref Range   I-stat hCG, quantitative <5.0 <5 mIU/mL   Comment 3          Troponin I (High Sensitivity)  Result Value Ref Range   Troponin I (High Sensitivity) 3 <18 ng/L  Troponin I (High Sensitivity)  Result Value Ref Range   Troponin I (High Sensitivity) 2 <18 ng/L   DG Chest 2 View  Result Date: 02/04/2022 CLINICAL DATA:  chest pain EXAM: CHEST - 2 VIEW COMPARISON:  Nov 01, 2021  FINDINGS: Right transjugular Port-A-Cath with its tip at the lower SVC and left transjugular central venous catheter with its tip at the proximal right atrium are stable. Heart size and mediastinal contours are within normal limits. No consolidation, pleural effusion or vascular congestion. There are prominent bronchovascular markings with some peribronchial cuffing seen and is more conspicuous in the present study. The visualized skeletal structures are unremarkable. IMPRESSION: 1. There are prominent bronchovascular markings with some peribronchial cuffing seen and are more conspicuous in the present study of likely chronic bronchitis. Clinical correlation is suggested. 2.  No consolidation, pleural effusion or vascular congestion. Electronically Signed   By: Frazier Richards M.D.   On: 02/04/2022 18:32       Antonietta Breach, PA-C 02/05/22 Frances Maywood, DO 02/05/22 Greggory Keen

## 2022-02-04 NOTE — ED Provider Notes (Signed)
[facility] DEPT Provider Note   CSN: [id number] Arrival date & time: 02/04/22  1714     History Chief Complaint  Patient presents with   Sickle Cell Pain Crisis   Chest Pain    Molly Gutierrez is a 41 y.o. female with history of sickle cell, asthma, and eczema presents the emergency department for evaluation of 1.5 to 2 weeks of diffuse chest pain with some nausea and vomiting.  Denies any shortness of breath or abdominal pain.  She reports that she had a fever yesterday of 101 that she took Tylenol for it and has not had an additional fever or elevated temperature since.  Denies any cough or any hemoptysis.  She reports this feels her typical sickle cell pain has tried her at home medications without much relief.  She reports her average hemoglobin is around 8.  She has a history of pulmonary embolisms however she is on Xarelto and reports compliancy to this.   Sickle Cell Pain Crisis Associated symptoms: chest pain, fever, nausea and vomiting   Associated symptoms: no shortness of breath   Chest Pain Associated symptoms: fever, nausea and vomiting   Associated symptoms: no abdominal pain, no back pain and no shortness of breath        Home Medications Prior to Admission medications   Medication Sig Start Date End Date Taking? Authorizing Provider  albuterol (VENTOLIN HFA) 108 (90 Base) MCG/ACT inhaler Inhale 2 puffs into the lungs every 6 (six) hours as needed for wheezing or shortness of breath. 10/29/21   Fenton Foy, NP  cholecalciferol (VITAMIN D3) 25 MCG (1000 UNIT) tablet Take 1,000 Units by mouth daily.    [provider]  Cyanocobalamin (VITAMIN B-12 PO) Take 1 tablet by mouth daily.    [provider]  Deferasirox 360 MG TABS Take 1,080 mg by mouth at bedtime. 04/23/21   [provider]  diphenhydrAMINE (BENADRYL) 25 mg capsule Take 25 mg by mouth 3 (three) times daily as needed for itching.    [provider]  fluticasone (FLONASE) 50 MCG/ACT nasal spray Place 1 spray into both nostrils daily as needed for allergies or rhinitis. 12/31/21 01/30/22  Fenton Foy, NP  folic acid (FOLVITE) 1 MG tablet Take 1 mg by mouth at bedtime.    [provider]  HYDROmorphone (DILAUDID) 4 MG tablet Take 1 tablet (4 mg total) by mouth every 6 (six) hours as needed for up to 15 days for severe pain. 01/22/22 02/06/22  [name], FNP  mirtazapine (REMERON) 45 MG tablet Take 1 tablet (45 mg total) by mouth at bedtime. 10/29/21 10/29/22  Fenton Foy, NP  mometasone-formoterol (DULERA) 100-5 MCG/ACT AERO Inhale 2 puffs into the lungs daily as needed for wheezing or shortness of breath. 10/29/21 10/29/22  Fenton Foy, NP  naloxone [facility]) nasal spray 4 mg/0.1 mL Place 0.4 mg into the nose once as needed. 11/01/21   [provider]  omeprazole (PRILOSEC) 20 MG capsule Take 1 capsule (20 mg total) by mouth 2 (two) times daily before a meal. Patient taking differently: Take 20 mg by mouth daily as needed (heartburn). 04/23/21 04/23/22  Vevelyn Francois, NP  polyethylene glycol (MIRALAX / GLYCOLAX) 17 g packet Take 17 g by mouth daily as needed for mild constipation.    [provider]  promethazine (PHENERGAN) 25 MG tablet Take 0.5-1 tablets (12.5-25 mg total) by mouth every 6 (six) hours as needed for nausea or vomiting.  Patient taking differently: Take 25 mg by mouth every 6 (six) hours as needed for nausea or vomiting. 10/31/21 10/31/22  Fenton Foy, NP  voxelotor (OXBRYTA) 500 MG TABS tablet Take 1,500 mg by mouth at bedtime. 11/06/21 11/01/22  [provider]  XARELTO 20 MG TABS tablet Take 1 tablet (20 mg total) by mouth daily with supper. 10/29/21 10/29/22  Fenton Foy, NP      Allergies    Cefaclor, Hydroxyurea, Omeprazole, Ketamine, and Tomato    Review of Systems   Review of Systems  Constitutional:  Positive for fever. Negative for chills.   Respiratory:  Negative for shortness of breath.   Cardiovascular:  Positive for chest pain.  Gastrointestinal:  Positive for nausea and vomiting. Negative for abdominal pain, constipation and diarrhea.  Musculoskeletal:  Negative for back pain and neck pain.    Physical Exam Updated Vital Signs BP 98/62   Pulse 73   Temp 99 F (37.2 C) (Oral)   Resp 18   Ht '5\' 3"'$  (1.6 m)   Wt 53.8 kg   LMP 01/29/2022 (Exact Date)   SpO2 99%   BMI 21.01 kg/m  Physical Exam Vitals and nursing note reviewed.  Constitutional:      Appearance: Normal appearance.  HENT:     Head: Normocephalic and atraumatic.  Eyes:     General: No scleral icterus. Cardiovascular:     Rate and Rhythm: Normal rate and regular rhythm.  Pulmonary:     Effort: Pulmonary effort is normal.     Breath sounds: Normal breath sounds. No decreased breath sounds.     Comments: Lungs are clear to auscultation bilaterally. Chest:     Chest wall: Tenderness present.     Comments: Port in upper right chest.  No surrounding erythema, fluctuance, or induration.  Patient has diffuse anterior chest pain.  No crepitus, deformity, or step-off noted.  No overlying skin changes noted. Abdominal:     General: Abdomen is flat. Bowel sounds are normal.     Palpations: Abdomen is soft.     Tenderness: There is no abdominal tenderness. There is no guarding or rebound.  Musculoskeletal:        General: No deformity.     Cervical back: Normal range of motion.  Skin:    General: Skin is warm and dry.  Neurological:     General: No focal deficit present.     Mental Status: She is alert. Mental status is at baseline.     ED Results / Procedures / Treatments   Labs (all labs ordered are listed, but only abnormal results are displayed) Labs Reviewed  BASIC METABOLIC PANEL - Abnormal; Notable for the following components:      Result Value   Potassium 3.4 (*)    All other components within normal limits  CBC - Abnormal; Notable for  the following components:   WBC 14.2 (*)    RBC 2.90 (*)    Hemoglobin 8.7 (*)    HCT 25.9 (*)    RDW 19.1 (*)    Platelets 629 (*)    nRBC 0.4 (*)    All other components within normal limits  RETICULOCYTES - Abnormal; Notable for the following components:   Retic Ct Pct 11.7 (*)    RBC. 2.90 (*)    Retic Count, Absolute 334.5 (*)    Immature Retic Fract 19.8 (*)    All other components within normal limits  I-STAT BETA HCG BLOOD, ED (MC, [facility], AP  ONLY)  TROPONIN I (HIGH SENSITIVITY)  TROPONIN I (HIGH SENSITIVITY)    EKG None  Radiology DG Chest 2 View  Result Date: 02/04/2022 CLINICAL DATA:  chest pain EXAM: CHEST - 2 VIEW COMPARISON:  Nov 01, 2021 FINDINGS: Right transjugular Port-A-Cath with its tip at the lower SVC and left transjugular central venous catheter with its tip at the proximal right atrium are stable. Heart size and mediastinal contours are within normal limits. No consolidation, pleural effusion or vascular congestion. There are prominent bronchovascular markings with some peribronchial cuffing seen and is more conspicuous in the present study. The visualized skeletal structures are unremarkable. IMPRESSION: 1. There are prominent bronchovascular markings with some peribronchial cuffing seen and are more conspicuous in the present study of likely chronic bronchitis. Clinical correlation is suggested. 2.  No consolidation, pleural effusion or vascular congestion. Electronically Signed   By: Frazier Richards M.D.   On: 02/04/2022 18:32    Procedures Procedures   Medications Ordered in ED Medications  lactated ringers bolus 1,000 mL (has no administration in time range)  HYDROmorphone (DILAUDID) injection 1 mg (has no administration in time range)    ED Course/ Medical Decision Making/ A&P                           Medical Decision Making Amount and/or Complexity of Data Reviewed Labs: ordered. Radiology: ordered.  Risk Prescription drug management.   41 y/o  F presents the emergency department for evaluation of chest pain for the past 1.5 to 2 weeks.  Differential diagnosis includes but is not limited to sickle cell pain, acute chest syndrome, ACS, arrhythmia, costochondritis, chronic pain, PE, pneumonia, pneumothorax.  Vital signs show on the lower side for her blood pressure otherwise normal.  Physical exam is pertinent for reproducible chest pain upon palpation.  Regular rate and rhythm.  Lungs are clear to auscultation bilaterally.  Patient in no acute distress.  We will order labs and chest x-ray imaging.  I independently reviewed and interpreted the patient's labs.  BMP shows slightly decrease in potassium 3 and 4 otherwise no electrolyte abnormality.  CBC shows elevated white blood cell count of 14.2 with a hemoglobin of 8.6 which is around patient's baseline.  Patient has some new thrombocytosis at 629.  It appears that she has had thrombocytosis in the past however I do not record any being this high.  Reticulocyte cell count up.  Troponins flat at 2.  COVID and flu pending.  hCG negative.  Chest x-ray shows there is some prominent bronchovascular markings with some peribronchial cuffing seen and are more conspicuous in the presence study of likely chronic bronchitis.  No consolidation, pleural effusion, or vascular congestion noted.  I did order the patient 1 L of fluid and Dilaudid.  Additional dose of Dilaudid and Percocet pending.  I discussed with my attending possible PE given the patient's history although she reports compliancy with her medications.  He reports that this is likely just her typical sickle cell pain and does not recommend any further imaging at this time.  I discussed this case with my attending physician who cosigned this note including patient's presenting symptoms, physical exam, and planned diagnostics and interventions. Attending physician stated agreement with plan or made changes to plan which were implemented.    10:52 PM Care of [facility] transferred to [facility] at the end of my shift as the patient will require reassessment once labs/imaging have resulted.  Patient presentation, ED course, and plan of care discussed with review of all pertinent labs and imaging. Please see his/her note for further details regarding further ED course and disposition. Plan at time of handoff is re-evaluate pain status after medications. This may be altered or completely changed at the discretion of the oncoming team pending results of further workup.  Final Clinical Impression(s) / ED Diagnoses Final diagnoses:  None    Rx / DC Orders ED Discharge Orders     None         Sherrell Puller, PA-C 02/04/22 [place], [place], DO 02/04/22 2304

## 2022-02-04 NOTE — ED Notes (Signed)
Pt has a Port.

## 2022-02-04 NOTE — ED Notes (Signed)
Pt states she has her port accessed for blood work

## 2022-02-04 NOTE — ED Provider Triage Note (Signed)
Emergency Medicine Provider Triage Evaluation Note  [facility] , a 41 y.o. female  was evaluated in triage.  Pt complains of chest pain which is her typical SS pain symptom. Nausea and vomiting as well - for approx 2 days. Was recently seen and admitted at OSH for pain. She states she believes she is dehydrated (has been outside a lot).  Review of Systems  Positive: CP, N, V  Negative: Fever   Physical Exam  BP 104/68 (BP Location: Left Arm)   Pulse 90   Temp 99 F (37.2 C) (Oral)   Resp 17   Ht '5\' 3"'$  (1.6 m)   Wt 53.8 kg   LMP 01/29/2022 (Exact Date)   SpO2 98%   BMI 21.01 kg/m  Gen:   Awake, no distress   Resp:  Normal effort MSK:   Moves extremities without difficulty  Other:    Medical Decision Making  Medically screening exam initiated at 6:42 PM.  Appropriate orders placed.  [facility] was informed that the remainder of the evaluation will be completed by another provider, this initial triage assessment does not replace that evaluation, and the importance of remaining in the ED until their evaluation is complete.  Labs, xray of chest  NML vitals, non-toxic appearance.   Pati Gallo [place], [place] 02/04/22 1844

## 2022-02-04 NOTE — ED Triage Notes (Signed)
Pt reports chest pain and N/V x2 week. Hx of sickle cell. Denies shortness of breath.

## 2022-02-05 DIAGNOSIS — D5700 Hb-SS disease with crisis, unspecified: Secondary | ICD-10-CM

## 2022-02-05 LAB — HEPATIC FUNCTION PANEL
ALT: 14 U/L (ref 0–44)
AST: 27 U/L (ref 15–41)
Albumin: 3.8 g/dL (ref 3.5–5.0)
Alkaline Phosphatase: 53 U/L (ref 38–126)
Bilirubin, Direct: 0.3 mg/dL — ABNORMAL HIGH (ref 0.0–0.2)
Indirect Bilirubin: 1.7 mg/dL — ABNORMAL HIGH (ref 0.3–0.9)
Total Bilirubin: 2 mg/dL — ABNORMAL HIGH (ref 0.3–1.2)
Total Protein: 7.1 g/dL (ref 6.5–8.1)

## 2022-02-05 LAB — BASIC METABOLIC PANEL
Anion gap: 4 — ABNORMAL LOW (ref 5–15)
BUN: 7 mg/dL (ref 6–20)
CO2: 26 mmol/L (ref 22–32)
Calcium: 8.7 mg/dL — ABNORMAL LOW (ref 8.9–10.3)
Chloride: 109 mmol/L (ref 98–111)
Creatinine, Ser: 0.62 mg/dL (ref 0.44–1.00)
GFR, Estimated: >60 mL/min (ref >60)
Glucose, Bld: 96 mg/dL (ref 70–99)
Potassium: 3.3 mmol/L — ABNORMAL LOW (ref 3.5–5.1)
Sodium: 139 mmol/L (ref 135–145)

## 2022-02-05 LAB — CBC
HCT: 23 % — ABNORMAL LOW (ref 36.0–46.0)
Hemoglobin: 8 g/dL — ABNORMAL LOW (ref 12.0–15.0)
MCH: 30.9 pg (ref 26.0–34.0)
MCHC: 34.8 g/dL (ref 30.0–36.0)
MCV: 88.8 fL (ref 80.0–100.0)
Platelets: 550 10*3/uL — ABNORMAL HIGH (ref 150–400)
RBC: 2.59 MIL/uL — ABNORMAL LOW (ref 3.87–5.11)
RDW: 18.8 % — ABNORMAL HIGH (ref 11.5–15.5)
WBC: 13.8 10*3/uL — ABNORMAL HIGH (ref 4.0–10.5)
nRBC: 0.4 % — ABNORMAL HIGH (ref 0.0–0.2)

## 2022-02-05 LAB — MAGNESIUM: Magnesium: 2 mg/dL (ref 1.7–2.4)

## 2022-02-05 LAB — LIPASE, BLOOD: Lipase: 33 U/L (ref 11–51)

## 2022-02-05 MED ORDER — SODIUM CHLORIDE 0.9% FLUSH: 10.0000 mL | Freq: Two times a day (BID) | INTRAVENOUS | Status: DC

## 2022-02-05 MED ORDER — KETOROLAC TROMETHAMINE 15 MG/ML IJ SOLN
15.0000 mg | Freq: Four times a day (QID) | INTRAMUSCULAR | Status: DC
  Administered 2022-02-05 – 2022-02-09 (×18): 15 mg via INTRAVENOUS
  Filled 2022-02-05 (×18): qty 1

## 2022-02-05 MED ORDER — MIRTAZAPINE 15 MG PO TABS
45.0000 mg | ORAL_TABLET | Freq: Every day | ORAL | Status: DC
  Administered 2022-02-05 – 2022-02-08 (×4): 45 mg via ORAL
  Filled 2022-02-05 (×4): qty 3

## 2022-02-05 MED ORDER — SODIUM CHLORIDE 0.9% FLUSH: 9.0000 mL | INTRAVENOUS | Status: DC | PRN

## 2022-02-05 MED ORDER — SODIUM CHLORIDE 0.45 % IV SOLN: INTRAVENOUS | Status: AC

## 2022-02-05 MED ORDER — DIPHENHYDRAMINE HCL 12.5 MG/5ML PO ELIX: 12.5000 mg | ORAL_SOLUTION | Freq: Four times a day (QID) | ORAL | Status: DC | PRN

## 2022-02-05 MED ORDER — RIVAROXABAN 20 MG PO TABS
20.0000 mg | ORAL_TABLET | Freq: Every day | ORAL | Status: DC
  Administered 2022-02-05 – 2022-02-08 (×4): 20 mg via ORAL
  Filled 2022-02-05 (×4): qty 1

## 2022-02-05 MED ORDER — NALOXONE HCL 0.4 MG/ML IJ SOLN: 0.4000 mg | INTRAMUSCULAR | Status: DC | PRN

## 2022-02-05 MED ORDER — HYDROMORPHONE 1 MG/ML IV SOLN
INTRAVENOUS | Status: DC
  Administered 2022-02-05: 2.1 mg via INTRAVENOUS
  Administered 2022-02-05: 30 mg via INTRAVENOUS
  Administered 2022-02-05: 1.8 mg via INTRAVENOUS
  Administered 2022-02-05: 3.3 mg via INTRAVENOUS
  Administered 2022-02-05: 3.6 mg via INTRAVENOUS
  Administered 2022-02-06: 3 mg via INTRAVENOUS
  Administered 2022-02-06: 2.7 mg via INTRAVENOUS
  Administered 2022-02-06: 3 mg via INTRAVENOUS
  Filled 2022-02-05: qty 30

## 2022-02-05 MED ORDER — HYDROMORPHONE HCL 1 MG/ML IJ SOLN
1.0000 mg | Freq: Once | INTRAMUSCULAR | Status: AC | PRN
  Administered 2022-02-05: 1 mg via INTRAVENOUS
  Filled 2022-02-05: qty 1

## 2022-02-05 MED ORDER — POLYETHYLENE GLYCOL 3350 17 G PO PACK: 17.0000 g | PACK | Freq: Every day | ORAL | Status: DC | PRN

## 2022-02-05 MED ORDER — POTASSIUM CHLORIDE CRYS ER 20 MEQ PO TBCR
40.0000 meq | EXTENDED_RELEASE_TABLET | Freq: Once | ORAL | Status: AC
  Administered 2022-02-05: 40 meq via ORAL
  Filled 2022-02-05: qty 2

## 2022-02-05 MED ORDER — CHLORHEXIDINE GLUCONATE CLOTH 2 % EX PADS
6.0000 | MEDICATED_PAD | Freq: Every day | CUTANEOUS | Status: DC
  Administered 2022-02-05 – 2022-02-09 (×5): 6 via TOPICAL

## 2022-02-05 MED ORDER — DIPHENHYDRAMINE HCL 50 MG/ML IJ SOLN
12.5000 mg | Freq: Four times a day (QID) | INTRAMUSCULAR | Status: DC | PRN
  Administered 2022-02-05 – 2022-02-06 (×5): 12.5 mg via INTRAVENOUS
  Filled 2022-02-05 (×5): qty 1

## 2022-02-05 MED ORDER — SENNOSIDES-DOCUSATE SODIUM 8.6-50 MG PO TABS
1.0000 | ORAL_TABLET | Freq: Two times a day (BID) | ORAL | Status: DC
  Administered 2022-02-05 – 2022-02-09 (×9): 1 via ORAL
  Filled 2022-02-05 (×9): qty 1

## 2022-02-05 MED ORDER — ONDANSETRON HCL 4 MG/2ML IJ SOLN: 4.0000 mg | Freq: Four times a day (QID) | INTRAMUSCULAR | Status: DC | PRN

## 2022-02-05 MED ORDER — ALBUTEROL SULFATE (2.5 MG/3ML) 0.083% IN NEBU: 2.5000 mg | INHALATION_SOLUTION | Freq: Four times a day (QID) | RESPIRATORY_TRACT | Status: DC | PRN

## 2022-02-05 MED ORDER — HYDROMORPHONE 1 MG/ML IV SOLN: INTRAVENOUS | Status: DC

## 2022-02-05 NOTE — H&P (Signed)
History and Physical    [facility] HQI:[id number] DOB: 02/02/1981 DOA: 02/04/2022  PCP: Fenton Foy, NP  Patient coming from: Home  Chief Complaint: Chest pain  HPI: Molly Gutierrez is a 41 y.o. 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  Vital signs stable.  Labs showing WBC 14.2, hemoglobin 8.7 (stable), platelet count 629k (chronically elevated), potassium 3.4, high-sensitivity troponin negative x2, beta-hCG negative, SARS-CoV-2 PCR and influenza panel negative.  Chest x-ray showing findings consistent with likely chronic bronchitis, no consolidation or pleural effusion. Patient was given Dilaudid, Percocet, and 1 L LR bolus  Patient reports 2-week history of severe pain all over her severe chest which she describes as dull and achy.  Denies shortness of breath.  Reports 1 episode of fever 2 days ago for which she took Tylenol after which there was no recurrence of fevers.  Denies cough.  Also reports nausea and vomiting which she states happens every time during her sickle cell pain crisis episodes.  She denies abdominal pain, constipation, or diarrhea.  Denies any urinary symptoms.  She was able to eat crackers and drink ginger ale in the ED.  She continues to endorse severe chest pain despite receiving multiple doses of Dilaudid.  Reports compliance with Xarelto.  Review of Systems:  Review of Systems  All other systems reviewed and are negative.   Past Medical History:  Diagnosis Date   Asthma    Eczema    History of pulmonary embolus (PE)    Sickle cell anemia ([place])     Past Surgical History:  Procedure Laterality Date   CHOLECYSTECTOMY     ERCP     JOINT REPLACEMENT     PORTA CATH INSERTION     TUBAL LIGATION     WISDOM TOOTH EXTRACTION       reports that she has  never smoked. She has never used smokeless tobacco. She reports that she does not drink alcohol and does not use drugs.  Allergies  Allergen Reactions   Cefaclor Hives and Swelling   Hydroxyurea Palpitations and Other (See Comments)    Lowers "blood levels" and heart rate (causes HYPOtension); "it messes me up, it drops my levels and stuff"    Omeprazole Other (See Comments)    Causes "sharp pains in the stomach"   Ketamine Palpitations and Other (See Comments)    "Pt states she has had previous reaction to ketamine. States she becomes flushed, heart races, dizzy, and feels like she is going to pass out."   Tomato Itching and Rash    "Break out"    Family History  Problem Relation Age of Onset   Renal Disease Mother    Hypertension Mother    High Cholesterol Mother    Heart attack Mother    Diabetes Brother     Prior to Admission medications   Medication Sig Start Date End Date Taking? Authorizing Provider  albuterol (VENTOLIN HFA) 108 (90 Base) MCG/ACT inhaler Inhale 2 puffs into the lungs every 6 (six) hours as needed for wheezing or shortness of breath. 10/29/21   Fenton Foy, NP  cholecalciferol (VITAMIN D3) 25 MCG (1000 UNIT) tablet Take 1,000 Units by mouth daily.    [provider]  Cyanocobalamin (VITAMIN B-12 PO) Take 1 tablet by mouth daily.    [provider]  Deferasirox 360 MG TABS Take 1,080 mg by mouth at bedtime. 04/23/21   [provider]  diphenhydrAMINE (BENADRYL) 25 mg capsule Take 25 mg by mouth 3 (three) times daily as needed for itching.    [provider]  fluticasone (FLONASE) 50 MCG/ACT nasal spray Place 1 spray into both nostrils daily as needed for allergies or rhinitis. 12/31/21 01/30/22  Fenton Foy, NP  folic acid (FOLVITE) 1 MG tablet Take 1 mg by mouth at bedtime.    [provider]  HYDROmorphone (DILAUDID) 4 MG tablet Take 1 tablet (4 mg total) by mouth every 6 (six) hours as needed for up to 15  days for severe pain. 01/22/22 02/06/22  [name], FNP  mirtazapine (REMERON) 45 MG tablet Take 1 tablet (45 mg total) by mouth at bedtime. 10/29/21 10/29/22  Fenton Foy, NP  mometasone-formoterol (DULERA) 100-5 MCG/ACT AERO Inhale 2 puffs into the lungs daily as needed for wheezing or shortness of breath. 10/29/21 10/29/22  Fenton Foy, NP  naloxone [facility]) nasal spray 4 mg/0.1 mL Place 0.4 mg into the nose once as needed. 11/01/21   [provider]  omeprazole (PRILOSEC) 20 MG capsule Take 1 capsule (20 mg total) by mouth 2 (two) times daily before a meal. Patient taking differently: Take 20 mg by mouth daily as needed (heartburn). 04/23/21 04/23/22  Vevelyn Francois, NP  polyethylene glycol (MIRALAX / GLYCOLAX) 17 g packet Take 17 g by mouth daily as needed for mild constipation.    [provider]  promethazine (PHENERGAN) 25 MG tablet Take 0.5-1 tablets (12.5-25 mg total) by mouth every 6 (six) hours as needed for nausea or vomiting. Patient taking differently: Take 25 mg by mouth every 6 (six) hours as needed for nausea or vomiting. 10/31/21 10/31/22  Fenton Foy, NP  voxelotor (OXBRYTA) 500 MG TABS tablet Take 1,500 mg by mouth at bedtime. 11/06/21 11/01/22  [provider]  XARELTO 20 MG TABS tablet Take 1 tablet (20 mg total) by mouth daily with supper. 10/29/21 10/29/22  Fenton Foy, NP    Physical Exam: Vitals:   02/05/22 0100 02/05/22 0208 02/05/22 0216 02/05/22 0427  BP:  108/70 105/68 102/76  Pulse:  80 74 71  Resp:  '18 16 16  '$ Temp: 99 F (37.2 C)  98.8 F (37.1 C) 98.1 F (36.7 C)  TempSrc: Oral  Oral Oral  SpO2:  96% 98% 99%  Weight:      Height:        Physical Exam Vitals reviewed.  Constitutional:      General: She is not in acute distress. HENT:     Head: Normocephalic and atraumatic.  Eyes:     Extraocular Movements: Extraocular movements intact.  Cardiovascular:     Rate and Rhythm: Normal rate and regular rhythm.      Pulses: Normal pulses.  Pulmonary:     Effort: Pulmonary effort is normal.     Breath sounds: Normal breath sounds.  Abdominal:     General: Bowel sounds are normal. There is no distension.     Palpations: Abdomen is soft.     Tenderness: There is no abdominal tenderness.  Musculoskeletal:        General: No swelling or tenderness.     Cervical back: Normal range of motion.  Skin:    General: Skin is warm and dry.  Neurological:     General: No focal deficit present.     Mental Status: She  is alert and oriented to person, place, and time.      Labs on Admission: I have personally reviewed following labs and imaging studies  CBC: Recent Labs  Lab 02/04/22 1936  WBC 14.2*  HGB 8.7*  HCT 25.9*  MCV 89.3  PLT 277*   Basic Metabolic Panel: Recent Labs  Lab 02/04/22 1936  NA 139  K 3.4*  CL 109  CO2 24  GLUCOSE 95  BUN 7  CREATININE 0.56  CALCIUM 9.0   GFR: Estimated Creatinine Clearance: 77.3 mL/min (by C-G formula based on SCr of 0.56 mg/dL). Liver Function Tests: No results for input(s): "AST", "ALT", "ALKPHOS", "BILITOT", "PROT", "ALBUMIN" in the last 168 hours. No results for input(s): "LIPASE", "AMYLASE" in the last 168 hours. No results for input(s): "AMMONIA" in the last 168 hours. Coagulation Profile: No results for input(s): "INR", "PROTIME" in the last 168 hours. Cardiac Enzymes: No results for input(s): "CKTOTAL", "CKMB", "CKMBINDEX", "TROPONINI" in the last 168 hours. BNP (last 3 results) No results for input(s): "PROBNP" in the last 8760 hours. HbA1C: No results for input(s): "HGBA1C" in the last 72 hours. CBG: No results for input(s): "GLUCAP" in the last 168 hours. Lipid Profile: No results for input(s): "CHOL", "HDL", "LDLCALC", "TRIG", "CHOLHDL", "LDLDIRECT" in the last 72 hours. Thyroid Function Tests: No results for input(s): "TSH", "T4TOTAL", "FREET4", "T3FREE", "THYROIDAB" in the last 72 hours. Anemia Panel: Recent Labs     02/04/22 1936  RETICCTPCT 11.7*   Urine analysis:    Component Value Date/Time   COLORURINE YELLOW 12/11/2021 2103   APPEARANCEUR CLEAR 12/11/2021 2103   LABSPEC 1.011 12/11/2021 2103   PHURINE 5.0 12/11/2021 2103   GLUCOSEU NEGATIVE 12/11/2021 2103   HGBUR NEGATIVE 12/11/2021 2103   BILIRUBINUR NEGATIVE 12/11/2021 2103   BILIRUBINUR neg 08/03/2020 1429   KETONESUR NEGATIVE 12/11/2021 2103   PROTEINUR NEGATIVE 12/11/2021 2103   UROBILINOGEN 1.0 08/03/2020 1429   NITRITE NEGATIVE 12/11/2021 2103   LEUKOCYTESUR NEGATIVE 12/11/2021 2103    Radiological Exams on Admission: I have personally reviewed images DG Chest 2 View  Result Date: 02/04/2022 CLINICAL DATA:  chest pain EXAM: CHEST - 2 VIEW COMPARISON:  Nov 01, 2021 FINDINGS: Right transjugular Port-A-Cath with its tip at the lower SVC and left transjugular central venous catheter with its tip at the proximal right atrium are stable. Heart size and mediastinal contours are within normal limits. No consolidation, pleural effusion or vascular congestion. There are prominent bronchovascular markings with some peribronchial cuffing seen and is more conspicuous in the present study. The visualized skeletal structures are unremarkable. IMPRESSION: 1. There are prominent bronchovascular markings with some peribronchial cuffing seen and are more conspicuous in the present study of likely chronic bronchitis. Clinical correlation is suggested. 2.  No consolidation, pleural effusion or vascular congestion. Electronically Signed   By: Frazier Richards M.D.   On: 02/04/2022 18:32    EKG: Independently reviewed.  Sinus rhythm, diffuse borderline T wave abnormalities.  Assessment and Plan  Sickle cell pain crisis Chest pain Work-up not suggestive of acute chest syndrome or acute coronary syndrome.  PE less likely as patient reports compliance with Eliquis, not tachycardic or hypoxic. -0.45% saline at 100 cc/h -IV Dilaudid PCA -IV Toradol 15 mg every  6 hours -Monitor vital signs closely and reevaluate pain scale regularly  Nausea and vomiting Not actively vomiting at this time, was able to tolerate eating crackers and drinking ginger ale.  Abdominal exam benign. -Check lipase and LFTs -Antiemetic as needed  Mild  leukocytosis Likely reactive.  No infectious signs or symptoms. -Continue to monitor  Anemia of chronic disease Hemoglobin at baseline and no clinical indication for blood transfusion at this time. -Monitor H&H  Chronic thrombocytosis Possibly reactive. -Continue to monitor  Mild hypokalemia Magnesium is normal. -Replace potassium and continue to monitor  History of PE -Continue Xarelto after pharmacy med rec is done.  Asthma Stable, no signs of acute exacerbation -Albuterol as needed  History of transfusional iron overload -Continue deferasirox after pharmacy med rec is done.  DVT prophylaxis:  Continue Xarelto after pharmacy med rec is done. Code Status: Full Code Family Communication: No family available at this time. Level of care: Telemetry bed Admission status: It is my clinical opinion that referral for OBSERVATION is reasonable and necessary in this patient based on the above information provided. The aforementioned taken together are felt to place the patient at high risk for further clinical deterioration. However, it is anticipated that the patient may be medically stable for discharge from the hospital within 24 to 48 hours.   [name] MD [facility]  If 7PM-7AM, please contact night-coverage [email or web address]  02/05/2022, 4:50 AM

## 2022-02-05 NOTE — Progress Notes (Signed)
Pt arrived to room via wheelchair from ED. Pt ambulated to bed without assistance. Pt belongings intact. Pt oriented to room/floor, demonstrates understanding Skin assessed. Denies any further needs at this time.

## 2022-02-05 NOTE — Progress Notes (Signed)
  Transition of Care [facility]) Screening Note   Patient Details  Name: Molly Gutierrez Date of Birth: [birth year]   Transition of [facility]) CM/SW Contact:    Dessa Phi, RN Phone Number: 02/05/2022, 2:07 PM    Transition of Care Department [facility]) has reviewed patient and no TOC needs have been identified at this time. We will continue to monitor patient advancement through interdisciplinary progression rounds. If new patient transition needs arise, please place a TOC consult.

## 2022-02-05 NOTE — Care Management Obs Status (Signed)
[place] NOTIFICATION   Patient Details  Name: Molly Gutierrez MRN: [id number] Date of Birth: [birth year]   Medicare Observation Status Notification Given:  Yes    Dessa Phi, RN 02/05/2022, 2:06 PM

## 2022-02-06 DIAGNOSIS — Z91018 Allergy to other foods: Secondary | ICD-10-CM | POA: Diagnosis not present

## 2022-02-06 DIAGNOSIS — K219 Gastro-esophageal reflux disease without esophagitis: Secondary | ICD-10-CM | POA: Diagnosis present

## 2022-02-06 DIAGNOSIS — R112 Nausea with vomiting, unspecified: Secondary | ICD-10-CM | POA: Diagnosis present

## 2022-02-06 DIAGNOSIS — Z881 Allergy status to other antibiotic agents status: Secondary | ICD-10-CM | POA: Diagnosis not present

## 2022-02-06 DIAGNOSIS — Z79899 Other long term (current) drug therapy: Secondary | ICD-10-CM | POA: Diagnosis not present

## 2022-02-06 DIAGNOSIS — Z7951 Long term (current) use of inhaled steroids: Secondary | ICD-10-CM | POA: Diagnosis not present

## 2022-02-06 DIAGNOSIS — D75839 Thrombocytosis, unspecified: Secondary | ICD-10-CM | POA: Diagnosis present

## 2022-02-06 DIAGNOSIS — Z7901 Long term (current) use of anticoagulants: Secondary | ICD-10-CM | POA: Diagnosis not present

## 2022-02-06 DIAGNOSIS — E876 Hypokalemia: Secondary | ICD-10-CM | POA: Diagnosis present

## 2022-02-06 DIAGNOSIS — Z888 Allergy status to other drugs, medicaments and biological substances status: Secondary | ICD-10-CM | POA: Diagnosis not present

## 2022-02-06 DIAGNOSIS — Z20822 Contact with and (suspected) exposure to covid-19: Secondary | ICD-10-CM | POA: Diagnosis present

## 2022-02-06 DIAGNOSIS — D5700 Hb-SS disease with crisis, unspecified: Secondary | ICD-10-CM | POA: Diagnosis present

## 2022-02-06 DIAGNOSIS — D638 Anemia in other chronic diseases classified elsewhere: Secondary | ICD-10-CM | POA: Diagnosis present

## 2022-02-06 DIAGNOSIS — Z86711 Personal history of pulmonary embolism: Secondary | ICD-10-CM | POA: Diagnosis not present

## 2022-02-06 DIAGNOSIS — Z9049 Acquired absence of other specified parts of digestive tract: Secondary | ICD-10-CM | POA: Diagnosis not present

## 2022-02-06 MED ORDER — ONDANSETRON HCL 4 MG/2ML IJ SOLN: 4.0000 mg | Freq: Four times a day (QID) | INTRAMUSCULAR | Status: DC | PRN

## 2022-02-06 MED ORDER — HYDROMORPHONE HCL 1 MG/ML IJ SOLN
1.0000 mg | INTRAMUSCULAR | Status: DC | PRN
  Administered 2022-02-06 – 2022-02-07 (×3): 1 mg via INTRAVENOUS
  Filled 2022-02-06 (×3): qty 1

## 2022-02-06 MED ORDER — DIPHENHYDRAMINE HCL 50 MG/ML IJ SOLN
12.5000 mg | Freq: Once | INTRAMUSCULAR | Status: DC
Start: 2022-02-06 — End: 2022-02-06

## 2022-02-06 MED ORDER — DIPHENHYDRAMINE HCL 25 MG PO CAPS
25.0000 mg | ORAL_CAPSULE | ORAL | Status: DC | PRN
  Administered 2022-02-06: 25 mg via ORAL
  Filled 2022-02-06: qty 1

## 2022-02-06 MED ORDER — HYDROMORPHONE HCL 1 MG/ML IJ SOLN: 0.5000 mg | Freq: Four times a day (QID) | INTRAMUSCULAR | Status: DC | PRN

## 2022-02-06 MED ORDER — DIPHENHYDRAMINE HCL 50 MG/ML IJ SOLN
12.5000 mg | Freq: Four times a day (QID) | INTRAMUSCULAR | Status: DC | PRN
  Administered 2022-02-06 – 2022-02-09 (×10): 12.5 mg via INTRAVENOUS
  Filled 2022-02-06 (×11): qty 1

## 2022-02-06 MED ORDER — HYDROMORPHONE 1 MG/ML IV SOLN
INTRAVENOUS | Status: DC
  Administered 2022-02-06: 30 mg via INTRAVENOUS
  Filled 2022-02-06: qty 30

## 2022-02-06 MED ORDER — SODIUM CHLORIDE 0.9% FLUSH: 9.0000 mL | INTRAVENOUS | Status: DC | PRN

## 2022-02-06 MED ORDER — DIPHENHYDRAMINE HCL 25 MG PO CAPS
25.0000 mg | ORAL_CAPSULE | Freq: Once | ORAL | Status: DC
Start: 2022-02-06 — End: 2022-02-06

## 2022-02-06 MED ORDER — NALOXONE HCL 0.4 MG/ML IJ SOLN: 0.4000 mg | INTRAMUSCULAR | Status: DC | PRN

## 2022-02-06 MED ORDER — HYDROMORPHONE HCL 1 MG/ML IJ SOLN: 1.0000 mg | Freq: Four times a day (QID) | INTRAMUSCULAR | Status: DC | PRN

## 2022-02-06 NOTE — Progress Notes (Signed)
SICKLE CELL SERVICE PROGRESS NOTE  [place] JZP:[id number] DOB: [birth year] DOA: 02/04/2022 PCP: Fenton Foy, NP  Assessment/Plan: Principal Problem:   Sickle cell pain crisis ([place]) Active Problems:   History of pulmonary embolism   Asthma   Nausea and vomiting   Anemia  Sickle Cell Pain Crisis: Patient now on Dilaudid PCA but low dose. Will change to the high dose PCA.  Continue Toradol, IVF and other medications. Continue to assess pain and adjust medications. History of pulmonary embolism: Continue anticoagulation.  Continue with close monitoring Nausea with vomiting: Appears resolved.  Continue monitor Anemia of chronic disease: Monitor H&H History of asthma: Continue breathing treatments.  Code Status: Full code Family Communication: No family at bedside Disposition Plan: [place]  Pager [phone]. If 7PM-7AM, please contact night-coverage.  02/06/2022, 6:25 AM  LOS: 0 days   Brief narrative: Molly Gutierrez is a 41 y.o. 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  Vital signs stable.  Labs showing WBC 14.2, hemoglobin 8.7 (stable), platelet count 629k (chronically elevated), potassium 3.4, high-sensitivity troponin negative x2, beta-hCG negative, SARS-CoV-2 PCR and influenza panel negative.  Chest x-ray showing findings consistent with likely chronic bronchitis, no consolidation or pleural effusion. Patient was given Dilaudid, Percocet, and 1 L LR bolus   Patient reports 2-week history of severe pain all over her severe chest which she describes as dull and achy.  Denies shortness of breath.  Reports 1 episode of fever 2 days ago for which she took Tylenol after which there was no recurrence of fevers.  Denies cough.  Also reports nausea and vomiting which she states  happens every time during her sickle cell pain crisis episodes.  She denies abdominal pain, constipation, or diarrhea.  Denies any urinary symptoms.  She was able to eat crackers and drink ginger ale in the ED.  She continues to endorse severe chest pain despite receiving multiple doses of Dilaudid.  Reports compliance with Xarelto.  Consultants: None  Procedures: None  Antibiotics: None  HPI/Subjective: Patient's pain is at 8 out of 10.  Has not been given any high-dose Dilaudid yet.  She is having pain mainly in her chest and lower back.  Vitals appear stable.  Objective: Vitals:   02/06/22 0001 02/06/22 0010 02/06/22 0400 02/06/22 0407  BP:  106/72 106/74   Pulse:  79 91   Resp: '17 18 18 16  '$ Temp:  98.7 F (37.1 C) 98.7 F (37.1 C)   TempSrc:  Oral Oral   SpO2: 97% 97% 96%   Weight:      Height:       Weight change:   Intake/Output Summary (Last 24 hours) at 02/06/2022 8270 Last data filed at 02/05/2022 2003 Gross per 24 hour  Intake 480 ml  Output --  Net 480 ml    General: Alert, awake, oriented x3, in no acute distress.  HEENT: [place]/AT PEERL, EOMI Neck: Trachea midline,  no masses, no thyromegal,y no JVD, no carotid bruit OROPHARYNX:  Moist, No exudate/ erythema/lesions.  Heart: Regular rate and rhythm, without murmurs, rubs, gallops, PMI non-displaced, no heaves or thrills on palpation.  Lungs: Clear to auscultation, no wheezing or rhonchi noted. No increased vocal fremitus resonant to percussion  Abdomen: Soft, nontender, nondistended, positive bowel sounds, no masses no hepatosplenomegaly noted..  Neuro: No focal neurological  deficits noted cranial nerves II through XII grossly intact. DTRs 2+ bilaterally upper and lower extremities. Strength 5 out of 5 in bilateral upper and lower extremities. Musculoskeletal: No warm swelling or erythema around joints, no spinal tenderness noted. Psychiatric: Patient alert and oriented x3, good insight and cognition, good recent  to remote recall. Lymph node survey: No cervical axillary or inguinal lymphadenopathy noted.   Data Reviewed: Basic Metabolic Panel: Recent Labs  Lab 02/04/22 1936 02/05/22 0530  NA 139 139  K 3.4* 3.3*  CL 109 109  CO2 24 26  GLUCOSE 95 96  BUN 7 7  CREATININE 0.56 0.62  CALCIUM 9.0 8.7*  MG  --  2.0   Liver Function Tests: Recent Labs  Lab 02/05/22 0530  AST 27  ALT 14  ALKPHOS 53  BILITOT 2.0*  PROT 7.1  ALBUMIN 3.8   Recent Labs  Lab 02/05/22 0530  LIPASE 33   No results for input(s): "AMMONIA" in the last 168 hours. CBC: Recent Labs  Lab 02/04/22 1936 02/05/22 0530  WBC 14.2* 13.8*  HGB 8.7* 8.0*  HCT 25.9* 23.0*  MCV 89.3 88.8  PLT 629* 550*   Cardiac Enzymes: No results for input(s): "CKTOTAL", "CKMB", "CKMBINDEX", "TROPONINI" in the last 168 hours. BNP (last 3 results) No results for input(s): "BNP" in the last 8760 hours.  ProBNP (last 3 results) No results for input(s): "PROBNP" in the last 8760 hours.  CBG: No results for input(s): "GLUCAP" in the last 168 hours.  Recent Results (from the past 240 hour(s))  Resp Panel by RT-PCR (Flu A&B, Covid) Anterior Nasal Swab     Status: None   Collection Time: 02/04/22  9:57 PM   Specimen: Anterior Nasal Swab  Result Value Ref Range Status   SARS Coronavirus 2 by RT PCR NEGATIVE NEGATIVE Final    Comment: (NOTE) SARS-CoV-2 target nucleic acids are NOT DETECTED.  The SARS-CoV-2 RNA is generally detectable in upper respiratory specimens during the acute phase of infection. The lowest concentration of SARS-CoV-2 viral copies this assay can detect is 138 copies/mL. A negative result does not preclude SARS-Cov-2 infection and should not be used as the sole basis for treatment or other patient management decisions. A negative result may occur with  improper specimen collection/handling, submission of specimen other than nasopharyngeal swab, presence of viral mutation(s) within the areas targeted  by this assay, and inadequate number of viral copies(<138 copies/mL). A negative result must be combined with clinical observations, patient history, and epidemiological information. The expected result is Negative.  Fact Sheet for Patients:  EntrepreneurPulse.com.au  Fact Sheet for Healthcare Providers:  IncredibleEmployment.be  This test is no t yet approved or cleared by the [place] FDA and  has been authorized for detection and/or diagnosis of SARS-CoV-2 by FDA under an Emergency Use Authorization (EUA). This EUA will remain  in effect (meaning this test can be used) for the duration of the COVID-19 declaration under Section 564(b)(1) of the Act, 21 U.S.C.section 360bbb-3(b)(1), unless the authorization is terminated  or revoked sooner.       Influenza A by PCR NEGATIVE NEGATIVE Final   Influenza B by PCR NEGATIVE NEGATIVE Final    Comment: (NOTE) The Xpert Xpress SARS-CoV-2/FLU/RSV plus assay is intended as an aid in the diagnosis of influenza from Nasopharyngeal swab specimens and should not be used as a sole basis for treatment. Nasal washings and aspirates are unacceptable for Xpert Xpress SARS-CoV-2/FLU/RSV testing.  Fact Sheet for Patients: EntrepreneurPulse.com.au  Fact Sheet  for Healthcare Providers: IncredibleEmployment.be  This test is not yet approved or cleared by the [place] and has been authorized for detection and/or diagnosis of SARS-CoV-2 by FDA under an Emergency Use Authorization (EUA). This EUA will remain in effect (meaning this test can be used) for the duration of the COVID-19 declaration under Section 564(b)(1) of the Act, 21 U.S.C. section 360bbb-3(b)(1), unless the authorization is terminated or revoked.  Performed at [facility], [place] [address] [place], [place] [address]      Studies: DG Chest 2 View  Result Date:  02/04/2022 CLINICAL DATA:  chest pain EXAM: CHEST - 2 VIEW COMPARISON:  Nov 01, 2021 FINDINGS: Right transjugular Port-A-Cath with its tip at the lower SVC and left transjugular central venous catheter with its tip at the proximal right atrium are stable. Heart size and mediastinal contours are within normal limits. No consolidation, pleural effusion or vascular congestion. There are prominent bronchovascular markings with some peribronchial cuffing seen and is more conspicuous in the present study. The visualized skeletal structures are unremarkable. IMPRESSION: 1. There are prominent bronchovascular markings with some peribronchial cuffing seen and are more conspicuous in the present study of likely chronic bronchitis. Clinical correlation is suggested. 2.  No consolidation, pleural effusion or vascular congestion. Electronically Signed   By: Frazier Richards M.D.   On: 02/04/2022 18:32    Scheduled Meds:  Chlorhexidine Gluconate Cloth  6 each Topical Daily   diphenhydrAMINE  25 mg Oral Once   HYDROmorphone   Intravenous Q4H   ketorolac  15 mg Intravenous Q6H   mirtazapine  45 mg Oral QHS   rivaroxaban  20 mg Oral QHS   senna-docusate  1 tablet Oral BID   sodium chloride flush  10-40 mL Intracatheter Q12H   Continuous Infusions:  Principal Problem:   Sickle cell pain crisis ([place]) Active Problems:   History of pulmonary embolism   Asthma   Nausea and vomiting   Anemia

## 2022-02-06 NOTE — Progress Notes (Signed)
       CROSS COVER NOTE  NAME: Molly Gutierrez MRN: [id number] DOB : Feb 12, 1981    Date of Service   02/06/2022   HPI/Events of Note   Medication request received for increase in benadryl dose, [name] reports 12.5 mg is ineffective.  Interventions   Plan: Home 25 mg Benadryl PO x1     This document was prepared using Dragon voice recognition software and may include unintentional dictation errors.  Neomia Glass DNP, MHA, FNP-BC Nurse Practitioner [facility] Pager [phone]

## 2022-02-06 NOTE — Progress Notes (Signed)
Assumed care of patient from previous nurse, Abby RN, at 1300. Agree with previous documented assessment. Will continue current plan of care.

## 2022-02-07 DIAGNOSIS — D5700 Hb-SS disease with crisis, unspecified: Secondary | ICD-10-CM | POA: Diagnosis not present

## 2022-02-07 LAB — CBC WITH DIFFERENTIAL/PLATELET
Abs Immature Granulocytes: 0.05 10*3/uL (ref 0.00–0.07)
Basophils Absolute: 0.1 10*3/uL (ref 0.0–0.1)
Basophils Relative: 0 %
Eosinophils Absolute: 0.6 10*3/uL — ABNORMAL HIGH (ref 0.0–0.5)
Eosinophils Relative: 5 %
HCT: 20.4 % — ABNORMAL LOW (ref 36.0–46.0)
Hemoglobin: 7 g/dL — ABNORMAL LOW (ref 12.0–15.0)
Immature Granulocytes: 0 %
Lymphocytes Relative: 18 %
Lymphs Abs: 2.4 10*3/uL (ref 0.7–4.0)
MCH: 30.6 pg (ref 26.0–34.0)
MCHC: 34.3 g/dL (ref 30.0–36.0)
MCV: 89.1 fL (ref 80.0–100.0)
Monocytes Absolute: 1.5 10*3/uL — ABNORMAL HIGH (ref 0.1–1.0)
Monocytes Relative: 11 %
Neutro Abs: 8.3 10*3/uL — ABNORMAL HIGH (ref 1.7–7.7)
Neutrophils Relative %: 66 %
Platelets: 523 10*3/uL — ABNORMAL HIGH (ref 150–400)
RBC: 2.29 MIL/uL — ABNORMAL LOW (ref 3.87–5.11)
RDW: 19.1 % — ABNORMAL HIGH (ref 11.5–15.5)
WBC: 12.9 10*3/uL — ABNORMAL HIGH (ref 4.0–10.5)
nRBC: 0.2 % (ref 0.0–0.2)

## 2022-02-07 LAB — COMPREHENSIVE METABOLIC PANEL
ALT: 13 U/L (ref 0–44)
AST: 31 U/L (ref 15–41)
Albumin: 3.7 g/dL (ref 3.5–5.0)
Alkaline Phosphatase: 52 U/L (ref 38–126)
Anion gap: 4 — ABNORMAL LOW (ref 5–15)
BUN: 15 mg/dL (ref 6–20)
CO2: 25 mmol/L (ref 22–32)
Calcium: 8.4 mg/dL — ABNORMAL LOW (ref 8.9–10.3)
Chloride: 111 mmol/L (ref 98–111)
Creatinine, Ser: 0.58 mg/dL (ref 0.44–1.00)
GFR, Estimated: >60 mL/min (ref >60)
Glucose, Bld: 97 mg/dL (ref 70–99)
Potassium: 4.3 mmol/L (ref 3.5–5.1)
Sodium: 140 mmol/L (ref 135–145)
Total Bilirubin: 1.9 mg/dL — ABNORMAL HIGH (ref 0.3–1.2)
Total Protein: 6.9 g/dL (ref 6.5–8.1)

## 2022-02-07 MED ORDER — ALBUTEROL SULFATE HFA 108 (90 BASE) MCG/ACT IN AERS: 2.0000 | INHALATION_SPRAY | Freq: Four times a day (QID) | RESPIRATORY_TRACT | Status: DC | PRN

## 2022-02-07 MED ORDER — HYDROMORPHONE HCL 1 MG/ML IJ SOLN
0.5000 mg | Freq: Once | INTRAMUSCULAR | Status: AC
  Administered 2022-02-07: 0.5 mg via INTRAVENOUS
  Filled 2022-02-07: qty 0.5

## 2022-02-07 MED ORDER — NALOXONE HCL 0.4 MG/ML IJ SOLN: 0.4000 mg | INTRAMUSCULAR | Status: DC | PRN

## 2022-02-07 MED ORDER — POLYETHYLENE GLYCOL 3350 17 G PO PACK: 17.0000 g | PACK | Freq: Every day | ORAL | Status: DC | PRN

## 2022-02-07 MED ORDER — DIPHENHYDRAMINE HCL 25 MG PO CAPS: 25.0000 mg | ORAL_CAPSULE | ORAL | Status: DC | PRN

## 2022-02-07 MED ORDER — SODIUM CHLORIDE 0.9% FLUSH: 9.0000 mL | INTRAVENOUS | Status: DC | PRN

## 2022-02-07 MED ORDER — HYDROMORPHONE 1 MG/ML IV SOLN
INTRAVENOUS | Status: DC
  Administered 2022-02-07: 3.5 mg via INTRAVENOUS
  Administered 2022-02-07: 30 mg via INTRAVENOUS
  Administered 2022-02-08 (×2): 5.5 mg via INTRAVENOUS
  Administered 2022-02-08: 5.29 mg via INTRAVENOUS
  Administered 2022-02-08: 30 mg via INTRAVENOUS
  Administered 2022-02-09: 1.5 mg via INTRAVENOUS
  Administered 2022-02-09: 30 mg via INTRAVENOUS
  Administered 2022-02-09: 6 mg via INTRAVENOUS
  Administered 2022-02-09: 4.5 mg via INTRAVENOUS
  Filled 2022-02-07 (×3): qty 30

## 2022-02-07 MED ORDER — HYDROMORPHONE HCL 1 MG/ML IJ SOLN
1.0000 mg | Freq: Once | INTRAMUSCULAR | Status: AC
  Administered 2022-02-07: 1 mg via INTRAVENOUS
  Filled 2022-02-07: qty 1

## 2022-02-07 MED ORDER — ONDANSETRON HCL 4 MG/2ML IJ SOLN: 4.0000 mg | Freq: Four times a day (QID) | INTRAMUSCULAR | Status: DC | PRN

## 2022-02-07 NOTE — Progress Notes (Signed)
SICKLE CELL SERVICE PROGRESS NOTE  [place] WIO:[id number] DOB: [birth year] DOA: 02/04/2022 PCP: Fenton Foy, NP  Assessment/Plan: Principal Problem:   Sickle cell pain crisis ([place]) Active Problems:   History of pulmonary embolism   Sickle cell crisis ([facility])   Asthma   Nausea and vomiting   Anemia  Sickle Cell Pain Crisis: Patient has had her PCA discontinued last night without any reason.  Her pain was down to 7 out of 10 at the time but now has gone back to 9 out of 10.  I will restart her PCA.  Continue with oral medications.  We will plan to discharge patient tomorrow if she does well with the PCA History of pulmonary embolism: Continue anticoagulation.  Continue with close monitoring Nausea with vomiting: Appears resolved.  Continue monitor Anemia of chronic disease: Monitor H&H History of asthma: Continue breathing treatments.  Code Status: Full code Family Communication: No family at bedside Disposition Plan: [place]  Pager [phone]. If 7PM-7AM, please contact night-coverage.  02/07/2022, 7:21 PM  LOS: 1 day   Brief narrative: Molly Gutierrez is a 41 y.o. 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  Vital signs stable.  Labs showing WBC 14.2, hemoglobin 8.7 (stable), platelet count 629k (chronically elevated), potassium 3.4, high-sensitivity troponin negative x2, beta-hCG negative, SARS-CoV-2 PCR and influenza panel negative.  Chest x-ray showing findings consistent with likely chronic bronchitis, no consolidation or pleural effusion. Patient was given Dilaudid, Percocet, and 1 L LR bolus   Patient reports 2-week history of severe pain all over her severe chest which she describes as dull and achy.  Denies shortness of breath.  Reports 1 episode of fever 2 days  ago for which she took Tylenol after which there was no recurrence of fevers.  Denies cough.  Also reports nausea and vomiting which she states happens every time during her sickle cell pain crisis episodes.  She denies abdominal pain, constipation, or diarrhea.  Denies any urinary symptoms.  She was able to eat crackers and drink ginger ale in the ED.  She continues to endorse severe chest pain despite receiving multiple doses of Dilaudid.  Reports compliance with Xarelto.  Consultants: None  Procedures: None  Antibiotics: None  HPI/Subjective: Patient's pain is higher today because she was off the PCA since 9:00 last night.  PCA being restarted.  She normally takes Dilaudid orally at home.  We will transition her to the oral Dilaudid tomorrow  Objective: Vitals:   02/07/22 0456 02/07/22 1204 02/07/22 1323 02/07/22 1619  BP: 93/66  101/76   Pulse: 84  80   Resp: '18 16 14 16  '$ Temp: 98.6 F (37 C)  98.7 F (37.1 C)   TempSrc: Oral     SpO2: 98% 95% 96% 95%  Weight:      Height:       Weight change:   Intake/Output Summary (Last 24 hours) at 02/07/2022 1921 Last data filed at 02/07/2022 1912 Gross per 24 hour  Intake 840 ml  Output --  Net 840 ml     General: Alert, awake, oriented x3, in no acute distress.  HEENT: [place]/AT PEERL, EOMI Neck: Trachea midline,  no masses, no thyromegal,y no JVD, no carotid bruit OROPHARYNX:  Moist, No exudate/ erythema/lesions.  Heart: Regular rate and rhythm, without murmurs, rubs, gallops, PMI  non-displaced, no heaves or thrills on palpation.  Lungs: Clear to auscultation, no wheezing or rhonchi noted. No increased vocal fremitus resonant to percussion  Abdomen: Soft, nontender, nondistended, positive bowel sounds, no masses no hepatosplenomegaly noted..  Neuro: No focal neurological deficits noted cranial nerves II through XII grossly intact. DTRs 2+ bilaterally upper and lower extremities. Strength 5 out of 5 in bilateral upper and lower  extremities. Musculoskeletal: No warm swelling or erythema around joints, no spinal tenderness noted. Psychiatric: Patient alert and oriented x3, good insight and cognition, good recent to remote recall. Lymph node survey: No cervical axillary or inguinal lymphadenopathy noted.   Data Reviewed: Basic Metabolic Panel: Recent Labs  Lab 02/04/22 1936 02/05/22 0530 02/07/22 1547  NA 139 139 140  K 3.4* 3.3* 4.3  CL 109 109 111  CO2 '24 26 25  '$ GLUCOSE 95 96 97  BUN '7 7 15  '$ CREATININE 0.56 0.62 0.58  CALCIUM 9.0 8.7* 8.4*  MG  --  2.0  --     Liver Function Tests: Recent Labs  Lab 02/05/22 0530 02/07/22 1547  AST 27 31  ALT 14 13  ALKPHOS 53 52  BILITOT 2.0* 1.9*  PROT 7.1 6.9  ALBUMIN 3.8 3.7    Recent Labs  Lab 02/05/22 0530  LIPASE 33    No results for input(s): "AMMONIA" in the last 168 hours. CBC: Recent Labs  Lab 02/04/22 1936 02/05/22 0530 02/07/22 1547  WBC 14.2* 13.8* 12.9*  NEUTROABS  --   --  8.3*  HGB 8.7* 8.0* 7.0*  HCT 25.9* 23.0* 20.4*  MCV 89.3 88.8 89.1  PLT 629* 550* 523*    Cardiac Enzymes: No results for input(s): "CKTOTAL", "CKMB", "CKMBINDEX", "TROPONINI" in the last 168 hours. BNP (last 3 results) No results for input(s): "BNP" in the last 8760 hours.  ProBNP (last 3 results) No results for input(s): "PROBNP" in the last 8760 hours.  CBG: No results for input(s): "GLUCAP" in the last 168 hours.  Recent Results (from the past 240 hour(s))  Resp Panel by RT-PCR (Flu A&B, Covid) Anterior Nasal Swab     Status: None   Collection Time: 02/04/22  9:57 PM   Specimen: Anterior Nasal Swab  Result Value Ref Range Status   SARS Coronavirus 2 by RT PCR NEGATIVE NEGATIVE Final    Comment: (NOTE) SARS-CoV-2 target nucleic acids are NOT DETECTED.  The SARS-CoV-2 RNA is generally detectable in upper respiratory specimens during the acute phase of infection. The lowest concentration of SARS-CoV-2 viral copies this assay can detect is 138  copies/mL. A negative result does not preclude SARS-Cov-2 infection and should not be used as the sole basis for treatment or other patient management decisions. A negative result may occur with  improper specimen collection/handling, submission of specimen other than nasopharyngeal swab, presence of viral mutation(s) within the areas targeted by this assay, and inadequate number of viral copies(<138 copies/mL). A negative result must be combined with clinical observations, patient history, and epidemiological information. The expected result is Negative.  Fact Sheet for Patients:  EntrepreneurPulse.com.au  Fact Sheet for Healthcare Providers:  IncredibleEmployment.be  This test is no t yet approved or cleared by the [place] FDA and  has been authorized for detection and/or diagnosis of SARS-CoV-2 by FDA under an Emergency Use Authorization (EUA). This EUA will remain  in effect (meaning this test can be used) for the duration of the COVID-19 declaration under Section 564(b)(1) of the Act, 21 U.S.C.section 360bbb-3(b)(1), unless the authorization is  terminated  or revoked sooner.       Influenza A by PCR NEGATIVE NEGATIVE Final   Influenza B by PCR NEGATIVE NEGATIVE Final    Comment: (NOTE) The Xpert Xpress SARS-CoV-2/FLU/RSV plus assay is intended as an aid in the diagnosis of influenza from Nasopharyngeal swab specimens and should not be used as a sole basis for treatment. Nasal washings and aspirates are unacceptable for Xpert Xpress SARS-CoV-2/FLU/RSV testing.  Fact Sheet for Patients: EntrepreneurPulse.com.au  Fact Sheet for Healthcare Providers: IncredibleEmployment.be  This test is not yet approved or cleared by the [place] FDA and has been authorized for detection and/or diagnosis of SARS-CoV-2 by FDA under an Emergency Use Authorization (EUA). This EUA will remain in effect (meaning  this test can be used) for the duration of the COVID-19 declaration under Section 564(b)(1) of the Act, 21 U.S.C. section 360bbb-3(b)(1), unless the authorization is terminated or revoked.  Performed at [facility], [place] [address], [place], [place] [address]      Studies: DG Chest 2 View  Result Date: 02/04/2022 CLINICAL DATA:  chest pain EXAM: CHEST - 2 VIEW COMPARISON:  Nov 01, 2021 FINDINGS: Right transjugular Port-A-Cath with its tip at the lower SVC and left transjugular central venous catheter with its tip at the proximal right atrium are stable. Heart size and mediastinal contours are within normal limits. No consolidation, pleural effusion or vascular congestion. There are prominent bronchovascular markings with some peribronchial cuffing seen and is more conspicuous in the present study. The visualized skeletal structures are unremarkable. IMPRESSION: 1. There are prominent bronchovascular markings with some peribronchial cuffing seen and are more conspicuous in the present study of likely chronic bronchitis. Clinical correlation is suggested. 2.  No consolidation, pleural effusion or vascular congestion. Electronically Signed   By: Frazier Richards M.D.   On: 02/04/2022 18:32    Scheduled Meds:  Chlorhexidine Gluconate Cloth  6 each Topical Daily   HYDROmorphone   Intravenous Q4H   ketorolac  15 mg Intravenous Q6H   mirtazapine  45 mg Oral QHS   rivaroxaban  20 mg Oral QHS   senna-docusate  1 tablet Oral BID   sodium chloride flush  10-40 mL Intracatheter Q12H   Continuous Infusions:  Principal Problem:   Sickle cell pain crisis ([place]) Active Problems:   History of pulmonary embolism   Sickle cell crisis ([facility])   Asthma   Nausea and vomiting   Anemia

## 2022-02-08 DIAGNOSIS — D5700 Hb-SS disease with crisis, unspecified: Secondary | ICD-10-CM | POA: Diagnosis not present

## 2022-02-08 LAB — CBC WITH DIFFERENTIAL/PLATELET
Abs Immature Granulocytes: 0.06 10*3/uL (ref 0.00–0.07)
Basophils Absolute: 0 10*3/uL (ref 0.0–0.1)
Basophils Relative: 0 %
Eosinophils Absolute: 0.7 10*3/uL — ABNORMAL HIGH (ref 0.0–0.5)
Eosinophils Relative: 5 %
HCT: 20.6 % — ABNORMAL LOW (ref 36.0–46.0)
Hemoglobin: 7 g/dL — ABNORMAL LOW (ref 12.0–15.0)
Immature Granulocytes: 1 %
Lymphocytes Relative: 18 %
Lymphs Abs: 2.4 10*3/uL (ref 0.7–4.0)
MCH: 30.3 pg (ref 26.0–34.0)
MCHC: 34 g/dL (ref 30.0–36.0)
MCV: 89.2 fL (ref 80.0–100.0)
Monocytes Absolute: 1.6 10*3/uL — ABNORMAL HIGH (ref 0.1–1.0)
Monocytes Relative: 13 %
Neutro Abs: 8.2 10*3/uL — ABNORMAL HIGH (ref 1.7–7.7)
Neutrophils Relative %: 63 %
Platelets: 532 10*3/uL — ABNORMAL HIGH (ref 150–400)
RBC: 2.31 MIL/uL — ABNORMAL LOW (ref 3.87–5.11)
RDW: 19.1 % — ABNORMAL HIGH (ref 11.5–15.5)
WBC: 13 10*3/uL — ABNORMAL HIGH (ref 4.0–10.5)
nRBC: 0.2 % (ref 0.0–0.2)

## 2022-02-08 LAB — COMPREHENSIVE METABOLIC PANEL
ALT: 13 U/L (ref 0–44)
AST: 30 U/L (ref 15–41)
Albumin: 3.7 g/dL (ref 3.5–5.0)
Alkaline Phosphatase: 52 U/L (ref 38–126)
Anion gap: 4 — ABNORMAL LOW (ref 5–15)
BUN: 11 mg/dL (ref 6–20)
CO2: 26 mmol/L (ref 22–32)
Calcium: 8.4 mg/dL — ABNORMAL LOW (ref 8.9–10.3)
Chloride: 111 mmol/L (ref 98–111)
Creatinine, Ser: 0.74 mg/dL (ref 0.44–1.00)
GFR, Estimated: >60 mL/min (ref >60)
Glucose, Bld: 107 mg/dL — ABNORMAL HIGH (ref 70–99)
Potassium: 3.9 mmol/L (ref 3.5–5.1)
Sodium: 141 mmol/L (ref 135–145)
Total Bilirubin: 2.1 mg/dL — ABNORMAL HIGH (ref 0.3–1.2)
Total Protein: 7 g/dL (ref 6.5–8.1)

## 2022-02-08 NOTE — Progress Notes (Signed)
SICKLE CELL SERVICE PROGRESS NOTE  Molly Gutierrez:[id number] DOB: [birth year] DOA: 02/04/2022 PCP: Fenton Foy, NP  Assessment/Plan: Principal Problem:   Sickle cell pain crisis ([place]) Active Problems:   History of pulmonary embolism   Sickle cell crisis (HCC)   Asthma   Nausea and vomiting   Anemia  Sickle Cell Pain Crisis: Patient has had her PCA discontinued last night without any reason.  Her pain was down to 7 out of 10 at the time but now has gone back to 9 out of 10.  I will restart her PCA.  Continue with oral medications.  We will plan to discharge patient tomorrow if she does well with the PCA History of pulmonary embolism: Continue anticoagulation.  Continue with close monitoring Nausea with vomiting: Appears resolved.  Continue monitor Anemia of chronic disease: Monitor H&H History of asthma: Continue breathing treatments.  Code Status: Full code Family Communication: No family at bedside Disposition Plan: [place]  Pager [phone]. If 7PM-7AM, please contact night-coverage.  02/08/2022, 10:58 AM  LOS: 2 days   Brief narrative: Molly Gutierrez is a 41 y.o. 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  Vital signs stable.  Labs showing WBC 14.2, hemoglobin 8.7 (stable), platelet count 629k (chronically elevated), potassium 3.4, high-sensitivity troponin negative x2, beta-hCG negative, SARS-CoV-2 PCR and influenza panel negative.  Chest x-ray showing findings consistent with likely chronic bronchitis, no consolidation or pleural effusion. Patient was given Dilaudid, Percocet, and 1 L LR bolus   Patient reports 2-week history of severe pain all over her severe chest which she describes as dull and achy.  Denies shortness of breath.  Reports 1 episode of fever 2 days  ago for which she took Tylenol after which there was no recurrence of fevers.  Denies cough.  Also reports nausea and vomiting which she states happens every time during her sickle cell pain crisis episodes.  She denies abdominal pain, constipation, or diarrhea.  Denies any urinary symptoms.  She was able to eat crackers and drink ginger ale in the ED.  She continues to endorse severe chest pain despite receiving multiple doses of Dilaudid.  Reports compliance with Xarelto.  Consultants: None  Procedures: None  Antibiotics: None  HPI/Subjective: Patient's pain is higher today because she was off the PCA since 9:00 last night.  PCA being restarted.  She normally takes Dilaudid orally at home.  We will transition her to the oral Dilaudid tomorrow  Objective: Vitals:   02/08/22 0405 02/08/22 0545 02/08/22 0614 02/08/22 0754  BP:   115/81   Pulse:   96   Resp: '18 14 15 14  '$ Temp:   99.6 F (37.6 C)   TempSrc:   Oral   SpO2: 95% 95% 99% 98%  Weight:      Height:       Weight change:   Intake/Output Summary (Last 24 hours) at 02/08/2022 1058 Last data filed at 02/07/2022 1912 Gross per 24 hour  Intake 720 ml  Output --  Net 720 ml     General: Alert, awake, oriented x3, in no acute distress.  HEENT: [place]/AT PEERL, EOMI Neck: Trachea midline,  no masses, no thyromegal,y no JVD, no carotid bruit OROPHARYNX:  Moist, No exudate/ erythema/lesions.  Heart: Regular rate and rhythm, without murmurs, rubs, gallops, PMI non-displaced, no heaves  or thrills on palpation.  Lungs: Clear to auscultation, no wheezing or rhonchi noted. No increased vocal fremitus resonant to percussion  Abdomen: Soft, nontender, nondistended, positive bowel sounds, no masses no hepatosplenomegaly noted..  Neuro: No focal neurological deficits noted cranial nerves II through XII grossly intact. DTRs 2+ bilaterally upper and lower extremities. Strength 5 out of 5 in bilateral upper and lower  extremities. Musculoskeletal: No warm swelling or erythema around joints, no spinal tenderness noted. Psychiatric: Patient alert and oriented x3, good insight and cognition, good recent to remote recall. Lymph node survey: No cervical axillary or inguinal lymphadenopathy noted.   Data Reviewed: Basic Metabolic Panel: Recent Labs  Lab 02/04/22 1936 02/05/22 0530 02/07/22 1547 02/08/22 0400  NA 139 139 140 141  K 3.4* 3.3* 4.3 3.9  CL 109 109 111 111  CO2 '24 26 25 26  '$ GLUCOSE 95 96 97 107*  BUN '7 7 15 11  '$ CREATININE 0.56 0.62 0.58 0.74  CALCIUM 9.0 8.7* 8.4* 8.4*  MG  --  2.0  --   --     Liver Function Tests: Recent Labs  Lab 02/05/22 0530 02/07/22 1547 02/08/22 0400  AST '27 31 30  '$ ALT '14 13 13  '$ ALKPHOS 53 52 52  BILITOT 2.0* 1.9* 2.1*  PROT 7.1 6.9 7.0  ALBUMIN 3.8 3.7 3.7    Recent Labs  Lab 02/05/22 0530  LIPASE 33    No results for input(s): "AMMONIA" in the last 168 hours. CBC: Recent Labs  Lab 02/04/22 1936 02/05/22 0530 02/07/22 1547 02/08/22 0400  WBC 14.2* 13.8* 12.9* 13.0*  NEUTROABS  --   --  8.3* 8.2*  HGB 8.7* 8.0* 7.0* 7.0*  HCT 25.9* 23.0* 20.4* 20.6*  MCV 89.3 88.8 89.1 89.2  PLT 629* 550* 523* 532*    Cardiac Enzymes: No results for input(s): "CKTOTAL", "CKMB", "CKMBINDEX", "TROPONINI" in the last 168 hours. BNP (last 3 results) No results for input(s): "BNP" in the last 8760 hours.  ProBNP (last 3 results) No results for input(s): "PROBNP" in the last 8760 hours.  CBG: No results for input(s): "GLUCAP" in the last 168 hours.  Recent Results (from the past 240 hour(s))  Resp Panel by RT-PCR (Flu A&B, Covid) Anterior Nasal Swab     Status: None   Collection Time: 02/04/22  9:57 PM   Specimen: Anterior Nasal Swab  Result Value Ref Range Status   SARS Coronavirus 2 by RT PCR NEGATIVE NEGATIVE Final    Comment: (NOTE) SARS-CoV-2 target nucleic acids are NOT DETECTED.  The SARS-CoV-2 RNA is generally detectable in upper  respiratory specimens during the acute phase of infection. The lowest concentration of SARS-CoV-2 viral copies this assay can detect is 138 copies/mL. A negative result does not preclude SARS-Cov-2 infection and should not be used as the sole basis for treatment or other patient management decisions. A negative result may occur with  improper specimen collection/handling, submission of specimen other than nasopharyngeal swab, presence of viral mutation(s) within the areas targeted by this assay, and inadequate number of viral copies(<138 copies/mL). A negative result must be combined with clinical observations, patient history, and epidemiological information. The expected result is Negative.  Fact Sheet for Patients:  EntrepreneurPulse.com.au  Fact Sheet for Healthcare Providers:  IncredibleEmployment.be  This test is no t yet approved or cleared by the [place] FDA and  has been authorized for detection and/or diagnosis of SARS-CoV-2 by FDA under an Emergency Use Authorization (EUA). This EUA will remain  in effect (  meaning this test can be used) for the duration of the COVID-19 declaration under Section 564(b)(1) of the Act, 21 U.S.C.section 360bbb-3(b)(1), unless the authorization is terminated  or revoked sooner.       Influenza A by PCR NEGATIVE NEGATIVE Final   Influenza B by PCR NEGATIVE NEGATIVE Final    Comment: (NOTE) The Xpert Xpress SARS-CoV-2/FLU/RSV plus assay is intended as an aid in the diagnosis of influenza from Nasopharyngeal swab specimens and should not be used as a sole basis for treatment. Nasal washings and aspirates are unacceptable for Xpert Xpress SARS-CoV-2/FLU/RSV testing.  Fact Sheet for Patients: EntrepreneurPulse.com.au  Fact Sheet for Healthcare Providers: IncredibleEmployment.be  This test is not yet approved or cleared by the [place] FDA and has been  authorized for detection and/or diagnosis of SARS-CoV-2 by FDA under an Emergency Use Authorization (EUA). This EUA will remain in effect (meaning this test can be used) for the duration of the COVID-19 declaration under Section 564(b)(1) of the Act, 21 U.S.C. section 360bbb-3(b)(1), unless the authorization is terminated or revoked.  Performed at [facility], [place] [address], [place], [place] [address]      Studies: DG Chest 2 View  Result Date: 02/04/2022 CLINICAL DATA:  chest pain EXAM: CHEST - 2 VIEW COMPARISON:  Nov 01, 2021 FINDINGS: Right transjugular Port-A-Cath with its tip at the lower SVC and left transjugular central venous catheter with its tip at the proximal right atrium are stable. Heart size and mediastinal contours are within normal limits. No consolidation, pleural effusion or vascular congestion. There are prominent bronchovascular markings with some peribronchial cuffing seen and is more conspicuous in the present study. The visualized skeletal structures are unremarkable. IMPRESSION: 1. There are prominent bronchovascular markings with some peribronchial cuffing seen and are more conspicuous in the present study of likely chronic bronchitis. Clinical correlation is suggested. 2.  No consolidation, pleural effusion or vascular congestion. Electronically Signed   By: Frazier Richards M.D.   On: 02/04/2022 18:32    Scheduled Meds:  Chlorhexidine Gluconate Cloth  6 each Topical Daily   HYDROmorphone   Intravenous Q4H   ketorolac  15 mg Intravenous Q6H   mirtazapine  45 mg Oral QHS   rivaroxaban  20 mg Oral QHS   senna-docusate  1 tablet Oral BID   sodium chloride flush  10-40 mL Intracatheter Q12H   Continuous Infusions:  Principal Problem:   Sickle cell pain crisis ([place]) Active Problems:   History of pulmonary embolism   Sickle cell crisis (HCC)   Asthma   Nausea and vomiting   Anemia

## 2022-02-09 DIAGNOSIS — D5700 Hb-SS disease with crisis, unspecified: Secondary | ICD-10-CM | POA: Diagnosis not present

## 2022-02-09 MED ORDER — HEPARIN SOD (PORK) LOCK FLUSH 100 UNIT/ML IV SOLN
500.0000 [IU] | INTRAVENOUS | Status: DC | PRN
  Filled 2022-02-09: qty 5

## 2022-02-09 MED ORDER — HYDROMORPHONE HCL 4 MG PO TABS: 4.0000 mg | ORAL_TABLET | ORAL | 0 refills | Status: DC | PRN

## 2022-02-09 NOTE — Discharge Summary (Signed)
Physician Discharge Summary   Patient: Kenedee Gabbard MRN: [id number] DOB: 12/13/1980  Admit date:     02/04/2022  Discharge date: 02/09/2022  Discharge Physician: GARBA,LAWAL   PCP: Nichols, Tonya S, NP   Recommendations at discharge:    Follow up with PCP.   Discharge Diagnoses: Principal Problem:   Sickle cell pain crisis (HCC) Active Problems:   History of pulmonary embolism   Sickle cell crisis (HCC)   Asthma   Nausea and vomiting   Anemia  Resolved Problems:   * No resolved hospital problems. *  Hospital Course: Patient was admitted with sickle cell pain crisis.  Patient has been on Dilaudid PCA, on Toradol, IV fluids and other medications.  She is doing much better.  Pain has improved close to baseline.  She has done better and pain is down to 3 out of 10.  She feels good and at this point ready for discharge home to follow-up with PCP.  Patient also has had hypokalemia.  H&H was stable but did  require any blood transfusion.  Her hemoglobin dropped to less than 7 g which led to the transfusion.  She was therefore transition to her regular home regimen.  We will continue treatment at home.  Follow-up with PCP   Pain control - [place] Controlled Substance Reporting System database was reviewed. and patient was instructed, not to drive, operate heavy machinery, perform activities at heights, swimming or participation in water activities or provide baby-sitting services while on Pain, Sleep and Anxiety Medications; until their outpatient Physician has advised to do so again. Also recommended to not to take more than prescribed Pain, Sleep and Anxiety Medications.  Consultants: None Procedures performed: None  Disposition: Home Diet recommendation:  Discharge Diet Orders (From admission, onward)     Start     Ordered   02/09/22 0000  Diet - low sodium heart healthy        02/09/22 1046           Regular diet DISCHARGE MEDICATION: Allergies as of  02/09/2022       Reactions   Cefaclor Hives, Swelling   Hydroxyurea Palpitations, Other (See Comments)   Lowers "blood levels" and heart rate (causes HYPOtension); "it messes me up, it drops my levels and stuff"   Omeprazole Other (See Comments)   Causes "sharp pains in the stomach"   Ketamine Palpitations, Other (See Comments)   "Pt states she has had previous reaction to ketamine. States she becomes flushed, heart races, dizzy, and feels like she is going to pass out."        Medication List     TAKE these medications    acetaminophen 500 MG tablet Commonly known as: TYLENOL Take 500 mg by mouth every 6 (six) hours as needed (cramps).   albuterol 108 (90 Base) MCG/ACT inhaler Commonly known as: VENTOLIN HFA Inhale 2 puffs into the lungs every 6 (six) hours as needed for wheezing or shortness of breath.   Deferasirox 360 MG Tabs Take 1,080 mg by mouth at bedtime. Exjade   diphenhydrAMINE 25 MG tablet Commonly known as: BENADRYL Take 25 mg by mouth every 4 (four) hours as needed for itching (with hydromorphone dose).   fluticasone 50 MCG/ACT nasal spray Commonly known as: FLONASE Place 1 spray into both nostrils daily as needed for allergies or rhinitis.   FOLIC ACID PO Take 1 tablet by mouth at bedtime.   HYDROmorphone 4 MG tablet Commonly known as: Dilaudid Take 1 tablet (4 mg   total) by mouth every 4 (four) hours as needed for up to 15 days for severe pain.   mirtazapine 45 MG tablet Commonly known as: REMERON Take 1 tablet (45 mg total) by mouth at bedtime.   mometasone-formoterol 100-5 MCG/ACT Aero Commonly known as: DULERA Inhale 2 puffs into the lungs daily as needed for wheezing or shortness of breath.   naloxone 4 MG/0.1ML Liqd nasal spray kit Commonly known as: NARCAN Place 0.4 mg into the nose once as needed (opioid overdose).   polyethylene glycol 17 g packet Commonly known as: MIRALAX / GLYCOLAX Take 17 g by mouth daily as needed for mild  constipation.   promethazine 25 MG tablet Commonly known as: PHENERGAN Take 0.5-1 tablets (12.5-25 mg total) by mouth every 6 (six) hours as needed for nausea or vomiting. What changed:  how much to take when to take this reasons to take this   VITAMIN B-12 PO Take 1 tablet by mouth at bedtime.   VITAMIN D3 PO Take 1 tablet by mouth at bedtime.   Xarelto 20 MG Tabs tablet Generic drug: rivaroxaban Take 1 tablet (20 mg total) by mouth daily with supper. What changed: when to take this         Discharge Exam: Filed Weights   02/04/22 1726  Weight: 53.8 kg    General: Alert, awake, oriented x3, in no acute distress.  HEENT: [place]/AT PEERL, EOMI Neck: Trachea midline,  no masses, no thyromegal,y no JVD, no carotid bruit OROPHARYNX:  Moist, No exudate/ erythema/lesions.  Heart: Regular rate and rhythm, without murmurs, rubs, gallops, PMI non-displaced, no heaves or thrills on palpation.  Lungs: Clear to auscultation, no wheezing or rhonchi noted. No increased vocal fremitus resonant to percussion  Abdomen: Soft, nontender, nondistended, positive bowel sounds, no masses no hepatosplenomegaly noted..  Neuro: No focal neurological deficits noted cranial nerves II through XII grossly intact. DTRs 2+ bilaterally upper and lower extremities. Strength 5 out of 5 in bilateral upper and lower extremities. Musculoskeletal: No warm swelling or erythema around joints, no spinal tenderness noted. Psychiatric: Patient alert and oriented x3, good insight and cognition, good recent to remote recall. Lymph node survey: No cervical axillary or inguinal lymphadenopathy noted.  Condition at discharge: good  The results of significant diagnostics from this hospitalization (including imaging, microbiology, ancillary and laboratory) are listed below for reference.   Imaging Studies: DG Chest 2 View  Result Date: 02/04/2022 CLINICAL DATA:  chest pain EXAM: CHEST - 2 VIEW COMPARISON:  Nov 01, 2021  FINDINGS: Right transjugular Port-A-Cath with its tip at the lower SVC and left transjugular central venous catheter with its tip at the proximal right atrium are stable. Heart size and mediastinal contours are within normal limits. No consolidation, pleural effusion or vascular congestion. There are prominent bronchovascular markings with some peribronchial cuffing seen and is more conspicuous in the present study. The visualized skeletal structures are unremarkable. IMPRESSION: 1. There are prominent bronchovascular markings with some peribronchial cuffing seen and are more conspicuous in the present study of likely chronic bronchitis. Clinical correlation is suggested. 2.  No consolidation, pleural effusion or vascular congestion. Electronically Signed   By: Amar  Amaresh M.D.   On: 02/04/2022 18:32    Microbiology: Results for orders placed or performed during the hospital encounter of 02/04/22  Resp Panel by RT-PCR (Flu A&B, Covid) Anterior Nasal Swab     Status: None   Collection Time: 02/04/22  9:57 PM   Specimen: Anterior Nasal Swab  Result Value Ref   Range Status   SARS Coronavirus 2 by RT PCR NEGATIVE NEGATIVE Final    Comment: (NOTE) SARS-CoV-2 target nucleic acids are NOT DETECTED.  The SARS-CoV-2 RNA is generally detectable in upper respiratory specimens during the acute phase of infection. The lowest concentration of SARS-CoV-2 viral copies this assay can detect is 138 copies/mL. A negative result does not preclude SARS-Cov-2 infection and should not be used as the sole basis for treatment or other patient management decisions. A negative result may occur with  improper specimen collection/handling, submission of specimen other than nasopharyngeal swab, presence of viral mutation(s) within the areas targeted by this assay, and inadequate number of viral copies(<138 copies/mL). A negative result must be combined with clinical observations, patient history, and  epidemiological information. The expected result is Negative.  Fact Sheet for Patients:  https://www.fda.gov/media/152166/download  Fact Sheet for Healthcare Providers:  https://www.fda.gov/media/152162/download  This test is no t yet approved or cleared by the [place] FDA and  has been authorized for detection and/or diagnosis of SARS-CoV-2 by FDA under an Emergency Use Authorization (EUA). This EUA will remain  in effect (meaning this test can be used) for the duration of the COVID-19 declaration under Section 564(b)(1) of the Act, 21 U.S.C.section 360bbb-3(b)(1), unless the authorization is terminated  or revoked sooner.       Influenza A by PCR NEGATIVE NEGATIVE Final   Influenza B by PCR NEGATIVE NEGATIVE Final    Comment: (NOTE) The Xpert Xpress SARS-CoV-2/FLU/RSV plus assay is intended as an aid in the diagnosis of influenza from Nasopharyngeal swab specimens and should not be used as a sole basis for treatment. Nasal washings and aspirates are unacceptable for Xpert Xpress SARS-CoV-2/FLU/RSV testing.  Fact Sheet for Patients: https://www.fda.gov/media/152166/download  Fact Sheet for Healthcare Providers: https://www.fda.gov/media/152162/download  This test is not yet approved or cleared by the [place] FDA and has been authorized for detection and/or diagnosis of SARS-CoV-2 by FDA under an Emergency Use Authorization (EUA). This EUA will remain in effect (meaning this test can be used) for the duration of the COVID-19 declaration under Section 564(b)(1) of the Act, 21 U.S.C. section 360bbb-3(b)(1), unless the authorization is terminated or revoked.  Performed at [facility], [address] [place], [place] [address]     Labs: CBC: Recent Labs  Lab 02/04/22 1936 02/05/22 0530 02/07/22 1547 02/08/22 0400  WBC 14.2* 13.8* 12.9* 13.0*  NEUTROABS  --   --  8.3* 8.2*  HGB 8.7* 8.0* 7.0* 7.0*  HCT 25.9* 23.0* 20.4* 20.6*  MCV  89.3 88.8 89.1 89.2  PLT 629* 550* 523* 532*   Basic Metabolic Panel: Recent Labs  Lab 02/04/22 1936 02/05/22 0530 02/07/22 1547 02/08/22 0400  NA 139 139 140 141  K 3.4* 3.3* 4.3 3.9  CL 109 109 111 111  CO2 24 26 25 26  GLUCOSE 95 96 97 107*  BUN 7 7 15 11  CREATININE 0.56 0.62 0.58 0.74  CALCIUM 9.0 8.7* 8.4* 8.4*  MG  --  2.0  --   --    Liver Function Tests: Recent Labs  Lab 02/05/22 0530 02/07/22 1547 02/08/22 0400  AST 27 31 30  ALT 14 13 13  ALKPHOS 53 52 52  BILITOT 2.0* 1.9* 2.1*  PROT 7.1 6.9 7.0  ALBUMIN 3.8 3.7 3.7   CBG: No results for input(s): "GLUCAP" in the last 168 hours.  Discharge time spent: greater than 30 minutes.  Signed: GARBA,LAWAL, MD [facility] 02/09/2022 

## 2022-02-09 NOTE — Hospital Course (Signed)
Patient was admitted with sickle cell pain crisis.  Patient has been on Dilaudid PCA, on Toradol, IV fluids and other medications.  She is doing much better.  Pain has improved close to baseline.  She has done better and pain is down to 3 out of 10.  She feels good and at this point ready for discharge home to follow-up with PCP.  Patient also has had hypokalemia.  H&H was stable but did  require any blood transfusion.  Her hemoglobin dropped to less than 7 g which led to the transfusion.  She was therefore transition to her regular home regimen.  We will continue treatment at home.  Follow-up with PCP

## 2022-02-19 ENCOUNTER — Telehealth: Payer: Self-pay

## 2022-02-19 NOTE — Telephone Encounter (Signed)
Hydromorphone  

## 2022-02-20 ENCOUNTER — Other Ambulatory Visit: Payer: Self-pay | Admitting: Nurse Practitioner

## 2022-02-20 ENCOUNTER — Ambulatory Visit (INDEPENDENT_AMBULATORY_CARE_PROVIDER_SITE_OTHER): Payer: Medicare HMO | Admitting: Nurse Practitioner

## 2022-02-20 ENCOUNTER — Encounter: Payer: Self-pay | Admitting: Nurse Practitioner

## 2022-02-20 VITALS — BP 100/74 | HR 100 | Wt 112.4 lb

## 2022-02-20 DIAGNOSIS — D571 Sickle-cell disease without crisis: Secondary | ICD-10-CM | POA: Diagnosis not present

## 2022-02-20 DIAGNOSIS — D5700 Hb-SS disease with crisis, unspecified: Secondary | ICD-10-CM | POA: Diagnosis not present

## 2022-02-20 MED ORDER — HYDROMORPHONE HCL 4 MG PO TABS: 4.0000 mg | ORAL_TABLET | ORAL | 0 refills | Status: DC | PRN

## 2022-02-20 NOTE — Progress Notes (Signed)
$'@Patient'u$  ID: Molly Gutierrez, female    DOB: Feb 05, 1981, 41 y.o.   MRN: [id number]  Chief Complaint  Patient presents with   Follow-up    Pt is here for  SCD follow up visit. Pt is requesting a refill on XARELTO, HYDROmorphone (DILAUDID), mirtazapine.    Referring provider: Fenton Foy, NP  HPI  [name] presents for follow up  She  has a past medical history of Asthma, Eczema, History of pulmonary embolus (PE), and Sickle cell anemia ([place]).   Patient presents today for sickle cell follow-up.  Patient declines blood work or urine test today.  Patient states that she has taking Dilaudid.  She her last dose was yesterday.  Patient has no new issues or concerns today.  We will check blood work and urine at next visit. Denies f/c/s, n/v/d, hemoptysis, PND, leg swelling Denies chest pain or edema    Allergies  Allergen Reactions   Cefaclor Hives and Swelling   Hydroxyurea Palpitations and Other (See Comments)    Lowers "blood levels" and heart rate (causes HYPOtension); "it messes me up, it drops my levels and stuff"    Omeprazole Other (See Comments)    Causes "sharp pains in the stomach"   Ketamine Palpitations and Other (See Comments)    "Pt states she has had previous reaction to ketamine. States she becomes flushed, heart races, dizzy, and feels like she is going to pass out."    Immunization History  Administered Date(s) Administered   PFIZER Comirnaty(Gray Top)Covid-19 Tri-Sucrose Vaccine 09/25/2019, 10/16/2019   PFIZER(Purple Top)SARS-COV-2 Vaccination 09/25/2019, 10/16/2019   Pfizer Covid-19 Vaccine Bivalent Booster 49yr & up 09/25/2019, 10/16/2019   Tdap 07/09/2019    Past Medical History:  Diagnosis Date   Asthma    Eczema    History of pulmonary embolus (PE)    Sickle cell anemia (HCC)     Tobacco History: Social History   Tobacco Use  Smoking Status Never  Smokeless Tobacco Never   Counseling given: Not Answered   Outpatient  Encounter Medications as of 02/20/2022  Medication Sig   acetaminophen (TYLENOL) 500 MG tablet Take 500 mg by mouth every 6 (six) hours as needed (cramps).   albuterol (VENTOLIN HFA) 108 (90 Base) MCG/ACT inhaler Inhale 2 puffs into the lungs every 6 (six) hours as needed for wheezing or shortness of breath.   Cholecalciferol (VITAMIN D3 PO) Take 1 tablet by mouth at bedtime.   Cyanocobalamin (VITAMIN B-12 PO) Take 1 tablet by mouth at bedtime.   Deferasirox 360 MG TABS Take 1,080 mg by mouth at bedtime. Exjade   diphenhydrAMINE (BENADRYL) 25 MG tablet Take 25 mg by mouth every 4 (four) hours as needed for itching (with hydromorphone dose).   FOLIC ACID PO Take 1 tablet by mouth at bedtime.   mirtazapine (REMERON) 45 MG tablet Take 1 tablet (45 mg total) by mouth at bedtime.   mometasone-formoterol (DULERA) 100-5 MCG/ACT AERO Inhale 2 puffs into the lungs daily as needed for wheezing or shortness of breath.   naloxone (NARCAN) nasal spray 4 mg/0.1 mL Place 0.4 mg into the nose once as needed (opioid overdose).   polyethylene glycol (MIRALAX / GLYCOLAX) 17 g packet Take 17 g by mouth daily as needed for mild constipation.   promethazine (PHENERGAN) 25 MG tablet Take 0.5-1 tablets (12.5-25 mg total) by mouth every 6 (six) hours as needed for nausea or vomiting. (Patient taking differently: Take 25 mg by mouth every 4 (four) hours as needed for nausea or  vomiting (with hydromorphone dose).)   XARELTO 20 MG TABS tablet Take 1 tablet (20 mg total) by mouth daily with supper. (Patient taking differently: Take 20 mg by mouth at bedtime.)   [DISCONTINUED] HYDROmorphone (DILAUDID) 4 MG tablet Take 1 tablet (4 mg total) by mouth every 4 (four) hours as needed for up to 15 days for severe pain.   fluticasone (FLONASE) 50 MCG/ACT nasal spray Place 1 spray into both nostrils daily as needed for allergies or rhinitis. (Patient not taking: Reported on 02/05/2022)   [START ON 02/23/2022] HYDROmorphone (DILAUDID) 4 MG  tablet Take 1 tablet (4 mg total) by mouth every 4 (four) hours as needed for up to 15 days for severe pain.   No facility-administered encounter medications on file as of 02/20/2022.     Review of Systems  Review of Systems  Constitutional: Negative.   HENT: Negative.    Cardiovascular: Negative.   Gastrointestinal: Negative.   Allergic/Immunologic: Negative.   Neurological: Negative.   Psychiatric/Behavioral: Negative.         Physical Exam  BP 100/74 (BP Location: Right Arm, Patient Position: Sitting, Cuff Size: Normal)   Pulse 100   Wt 112 lb 6.4 oz (51 kg)   LMP 01/29/2022 (Exact Date)   BMI 19.91 kg/m   Wt Readings from Last 5 Encounters:  02/20/22 112 lb 6.4 oz (51 kg)  02/04/22 118 lb 9.6 oz (53.8 kg)  12/12/21 120 lb 9.5 oz (54.7 kg)  11/11/21 110 lb (49.9 kg)  11/01/21 107 lb (48.5 kg)     Physical Exam Vitals and nursing note reviewed.  Constitutional:      General: She is not in acute distress.    Appearance: She is well-developed.  Cardiovascular:     Rate and Rhythm: Normal rate and regular rhythm.  Pulmonary:     Effort: Pulmonary effort is normal.     Breath sounds: Normal breath sounds.  Neurological:     Mental Status: She is alert and oriented to person, place, and time.      Lab Results:  CBC    Component Value Date/Time   WBC 13.0 (H) 02/08/2022 0400   RBC 2.31 (L) 02/08/2022 0400   HGB 7.0 (L) 02/08/2022 0400   HGB 7.8 (L) 08/02/2020 1538   HCT 20.6 (L) 02/08/2022 0400   HCT 22.3 (L) 08/02/2020 1538   PLT 532 (H) 02/08/2022 0400   PLT 560 (H) 08/02/2020 1538   MCV 89.2 02/08/2022 0400   MCV 92 08/02/2020 1538   MCH 30.3 02/08/2022 0400   MCHC 34.0 02/08/2022 0400   RDW 19.1 (H) 02/08/2022 0400   RDW 23.8 (H) 08/02/2020 1538   LYMPHSABS 2.4 02/08/2022 0400   LYMPHSABS 3.0 08/02/2020 1538   MONOABS 1.6 (H) 02/08/2022 0400   EOSABS 0.7 (H) 02/08/2022 0400   EOSABS 0.4 08/02/2020 1538   BASOSABS 0.0 02/08/2022 0400    BASOSABS 0.1 08/02/2020 1538    BMET    Component Value Date/Time   NA 141 02/08/2022 0400   NA 137 08/02/2020 1538   K 3.9 02/08/2022 0400   CL 111 02/08/2022 0400   CO2 26 02/08/2022 0400   GLUCOSE 107 (H) 02/08/2022 0400   BUN 11 02/08/2022 0400   BUN 7 08/02/2020 1538   CREATININE 0.74 02/08/2022 0400   CALCIUM 8.4 (L) 02/08/2022 0400   GFRNONAA >60 02/08/2022 0400   GFRAA 136 08/02/2020 1538    BNP    Component Value Date/Time   BNP 70.6 05/05/2019  0204    ProBNP No results found for: "PROBNP"  Imaging: DG Chest 2 View  Result Date: 02/04/2022 CLINICAL DATA:  chest pain EXAM: CHEST - 2 VIEW COMPARISON:  Nov 01, 2021 FINDINGS: Right transjugular Port-A-Cath with its tip at the lower SVC and left transjugular central venous catheter with its tip at the proximal right atrium are stable. Heart size and mediastinal contours are within normal limits. No consolidation, pleural effusion or vascular congestion. There are prominent bronchovascular markings with some peribronchial cuffing seen and is more conspicuous in the present study. The visualized skeletal structures are unremarkable. IMPRESSION: 1. There are prominent bronchovascular markings with some peribronchial cuffing seen and are more conspicuous in the present study of likely chronic bronchitis. Clinical correlation is suggested. 2.  No consolidation, pleural effusion or vascular congestion. Electronically Signed   By: Frazier Richards M.D.   On: 02/04/2022 18:32     Assessment & Plan:   Hb-SS disease without crisis ([place]) - Sickle Cell Panel - [id number] 11+Oxyco+Alc+Crt-Bund  Follow up:  Follow up in 3 months or sooner if needed     Fenton Foy, NP 02/21/2022

## 2022-02-20 NOTE — Patient Instructions (Signed)
1. Hb-SS disease without crisis ([place])  - Sickle Cell Panel - [id number] 11+Oxyco+Alc+Crt-Bund  Follow up:  Follow up in 3 months or sooner if needed

## 2022-02-21 NOTE — Assessment & Plan Note (Signed)
-   Sickle Cell Panel - [id number] 11+Oxyco+Alc+Crt-Bund  Follow up:  Follow up in 3 months or sooner if needed

## 2022-03-05 ENCOUNTER — Other Ambulatory Visit: Payer: Self-pay | Admitting: Nurse Practitioner

## 2022-03-06 ENCOUNTER — Other Ambulatory Visit: Payer: Self-pay | Admitting: Nurse Practitioner

## 2022-03-06 DIAGNOSIS — D5700 Hb-SS disease with crisis, unspecified: Secondary | ICD-10-CM

## 2022-03-06 DIAGNOSIS — U071 COVID-19: Secondary | ICD-10-CM

## 2022-03-06 MED ORDER — PREDNISONE 20 MG PO TABS: 20.0000 mg | ORAL_TABLET | Freq: Every day | ORAL | 0 refills | Status: AC

## 2022-03-06 MED ORDER — AZITHROMYCIN 250 MG PO TABS: ORAL_TABLET | ORAL | 0 refills | Status: AC

## 2022-03-08 ENCOUNTER — Telehealth: Payer: Self-pay

## 2022-03-08 ENCOUNTER — Other Ambulatory Visit: Payer: Self-pay | Admitting: Family Medicine

## 2022-03-08 DIAGNOSIS — D5700 Hb-SS disease with crisis, unspecified: Secondary | ICD-10-CM

## 2022-03-08 MED ORDER — HYDROMORPHONE HCL 4 MG PO TABS: 4.0000 mg | ORAL_TABLET | ORAL | 0 refills | Status: AC | PRN

## 2022-03-08 NOTE — Telephone Encounter (Signed)
Error

## 2022-03-08 NOTE — Progress Notes (Signed)
Reviewed PDMP substance reporting system prior to prescribing opiate medications. No inconsistencies noted.   Meds ordered this encounter  Medications   HYDROmorphone (DILAUDID) 4 MG tablet    Sig: Take 1 tablet (4 mg total) by mouth every 4 (four) hours as needed for up to 15 days for severe pain.    Dispense:  60 tablet    Refill:  0    Order Specific Question:   Supervising Provider    Answer:   [name] [[id number]]   [name]  APRN, MSN, FNP-C [facility] [address] [place], [place] [address] [phone]

## 2022-03-08 NOTE — Telephone Encounter (Signed)
Hydromorphone  

## 2022-03-11 ENCOUNTER — Other Ambulatory Visit: Payer: Self-pay | Admitting: Nurse Practitioner

## 2022-03-18 ENCOUNTER — Other Ambulatory Visit: Payer: Self-pay | Admitting: Nurse Practitioner

## 2022-04-02 ENCOUNTER — Inpatient Hospital Stay (HOSPITAL_COMMUNITY)
Admission: EM | Admit: 2022-04-02 | Discharge: 2022-04-04 | DRG: 812 | Disposition: A | Discharge status: Home / Self Care | Payer: Medicare HMO | Attending: Internal Medicine | Admitting: Internal Medicine

## 2022-04-02 ENCOUNTER — Encounter (HOSPITAL_COMMUNITY): Payer: Self-pay | Admitting: *Deleted

## 2022-04-02 ENCOUNTER — Other Ambulatory Visit: Payer: Self-pay

## 2022-04-02 ENCOUNTER — Telehealth: Payer: Self-pay

## 2022-04-02 DIAGNOSIS — Z888 Allergy status to other drugs, medicaments and biological substances status: Secondary | ICD-10-CM | POA: Diagnosis not present

## 2022-04-02 DIAGNOSIS — Z7901 Long term (current) use of anticoagulants: Secondary | ICD-10-CM | POA: Diagnosis not present

## 2022-04-02 DIAGNOSIS — J4520 Mild intermittent asthma, uncomplicated: Secondary | ICD-10-CM | POA: Diagnosis present

## 2022-04-02 DIAGNOSIS — D75839 Thrombocytosis, unspecified: Secondary | ICD-10-CM | POA: Diagnosis present

## 2022-04-02 DIAGNOSIS — Z9851 Tubal ligation status: Secondary | ICD-10-CM

## 2022-04-02 DIAGNOSIS — Z8616 Personal history of COVID-19: Secondary | ICD-10-CM | POA: Diagnosis not present

## 2022-04-02 DIAGNOSIS — D5700 Hb-SS disease with crisis, unspecified: Secondary | ICD-10-CM | POA: Diagnosis not present

## 2022-04-02 DIAGNOSIS — Z79899 Other long term (current) drug therapy: Secondary | ICD-10-CM

## 2022-04-02 DIAGNOSIS — G894 Chronic pain syndrome: Secondary | ICD-10-CM | POA: Diagnosis present

## 2022-04-02 DIAGNOSIS — F1120 Opioid dependence, uncomplicated: Secondary | ICD-10-CM | POA: Diagnosis present

## 2022-04-02 DIAGNOSIS — E876 Hypokalemia: Secondary | ICD-10-CM | POA: Diagnosis present

## 2022-04-02 DIAGNOSIS — Z86711 Personal history of pulmonary embolism: Secondary | ICD-10-CM | POA: Diagnosis not present

## 2022-04-02 LAB — BASIC METABOLIC PANEL
Anion gap: 5 (ref 5–15)
BUN: 10 mg/dL (ref 6–20)
CO2: 24 mmol/L (ref 22–32)
Calcium: 8.8 mg/dL — ABNORMAL LOW (ref 8.9–10.3)
Chloride: 110 mmol/L (ref 98–111)
Creatinine, Ser: 0.45 mg/dL (ref 0.44–1.00)
GFR, Estimated: >60 mL/min (ref >60)
Glucose, Bld: 101 mg/dL — ABNORMAL HIGH (ref 70–99)
Potassium: 3.4 mmol/L — ABNORMAL LOW (ref 3.5–5.1)
Sodium: 139 mmol/L (ref 135–145)

## 2022-04-02 LAB — CBC WITH DIFFERENTIAL/PLATELET
Abs Immature Granulocytes: 0.05 10*3/uL (ref 0.00–0.07)
Basophils Absolute: 0.1 10*3/uL (ref 0.0–0.1)
Basophils Relative: 0 %
Eosinophils Absolute: 0.2 10*3/uL (ref 0.0–0.5)
Eosinophils Relative: 1 %
HCT: 22.6 % — ABNORMAL LOW (ref 36.0–46.0)
Hemoglobin: 8.1 g/dL — ABNORMAL LOW (ref 12.0–15.0)
Immature Granulocytes: 0 %
Lymphocytes Relative: 15 %
Lymphs Abs: 2.2 10*3/uL (ref 0.7–4.0)
MCH: 32.4 pg (ref 26.0–34.0)
MCHC: 35.8 g/dL (ref 30.0–36.0)
MCV: 90.4 fL (ref 80.0–100.0)
Monocytes Absolute: 1.5 10*3/uL — ABNORMAL HIGH (ref 0.1–1.0)
Monocytes Relative: 10 %
Neutro Abs: 10.5 10*3/uL — ABNORMAL HIGH (ref 1.7–7.7)
Neutrophils Relative %: 74 %
Platelets: 560 10*3/uL — ABNORMAL HIGH (ref 150–400)
RBC: 2.5 MIL/uL — ABNORMAL LOW (ref 3.87–5.11)
RDW: 23.3 % — ABNORMAL HIGH (ref 11.5–15.5)
WBC: 14.5 10*3/uL — ABNORMAL HIGH (ref 4.0–10.5)
nRBC: 0.8 % — ABNORMAL HIGH (ref 0.0–0.2)

## 2022-04-02 LAB — I-STAT BETA HCG BLOOD, ED (MC, WL, AP ONLY): I-stat hCG, quantitative: <5 m[IU]/mL (ref <5)

## 2022-04-02 LAB — RETICULOCYTES
Immature Retic Fract: 17.1 % — ABNORMAL HIGH (ref 2.3–15.9)
RBC.: 2.53 MIL/uL — ABNORMAL LOW (ref 3.87–5.11)
Retic Count, Absolute: 493.5 10*3/uL — ABNORMAL HIGH (ref 19.0–186.0)
Retic Ct Pct: 19.4 % — ABNORMAL HIGH (ref 0.4–3.1)

## 2022-04-02 MED ORDER — SODIUM CHLORIDE 0.9 % IV SOLN
12.5000 mg | Freq: Once | INTRAVENOUS | Status: AC
  Administered 2022-04-02: 12.5 mg via INTRAVENOUS
  Filled 2022-04-02: qty 12.5

## 2022-04-02 MED ORDER — DIPHENHYDRAMINE HCL 25 MG PO CAPS
25.0000 mg | ORAL_CAPSULE | ORAL | Status: DC | PRN
  Administered 2022-04-02 – 2022-04-03 (×3): 25 mg via ORAL
  Filled 2022-04-02 (×3): qty 1

## 2022-04-02 MED ORDER — CHLORHEXIDINE GLUCONATE CLOTH 2 % EX PADS
6.0000 | MEDICATED_PAD | Freq: Every day | CUTANEOUS | Status: DC
  Administered 2022-04-03: 6 via TOPICAL

## 2022-04-02 MED ORDER — HYDROMORPHONE 1 MG/ML IV SOLN
INTRAVENOUS | Status: DC
  Filled 2022-04-02: qty 30

## 2022-04-02 MED ORDER — HYDROMORPHONE HCL 2 MG/ML IJ SOLN
2.0000 mg | Freq: Once | INTRAMUSCULAR | Status: AC
  Administered 2022-04-02: 2 mg via INTRAVENOUS
  Filled 2022-04-02: qty 1

## 2022-04-02 MED ORDER — SODIUM CHLORIDE 0.9% FLUSH: 9.0000 mL | INTRAVENOUS | Status: DC | PRN

## 2022-04-02 MED ORDER — HYDROMORPHONE HCL 1 MG/ML IJ SOLN: 1.0000 mg | INTRAMUSCULAR | Status: DC | PRN

## 2022-04-02 MED ORDER — RIVAROXABAN 20 MG PO TABS
20.0000 mg | ORAL_TABLET | Freq: Every day | ORAL | Status: DC
  Administered 2022-04-02 – 2022-04-03 (×2): 20 mg via ORAL
  Filled 2022-04-02 (×2): qty 1

## 2022-04-02 MED ORDER — SENNOSIDES-DOCUSATE SODIUM 8.6-50 MG PO TABS
1.0000 | ORAL_TABLET | Freq: Two times a day (BID) | ORAL | Status: DC
  Administered 2022-04-02 – 2022-04-04 (×4): 1 via ORAL
  Filled 2022-04-02 (×4): qty 1

## 2022-04-02 MED ORDER — SODIUM CHLORIDE 0.9% FLUSH: 10.0000 mL | INTRAVENOUS | Status: DC | PRN

## 2022-04-02 MED ORDER — SODIUM CHLORIDE 0.45 % IV SOLN: INTRAVENOUS | Status: DC

## 2022-04-02 MED ORDER — MIRTAZAPINE 15 MG PO TABS
45.0000 mg | ORAL_TABLET | Freq: Every day | ORAL | Status: DC
  Administered 2022-04-02 – 2022-04-03 (×2): 45 mg via ORAL
  Filled 2022-04-02 (×2): qty 3

## 2022-04-02 MED ORDER — ONDANSETRON HCL 4 MG/2ML IJ SOLN
4.0000 mg | Freq: Four times a day (QID) | INTRAMUSCULAR | Status: DC | PRN
  Administered 2022-04-02: 4 mg via INTRAVENOUS
  Filled 2022-04-02: qty 2

## 2022-04-02 MED ORDER — HEPARIN SOD (PORK) LOCK FLUSH 100 UNIT/ML IV SOLN
INTRAVENOUS | Status: AC
  Filled 2022-04-02: qty 5

## 2022-04-02 MED ORDER — POLYETHYLENE GLYCOL 3350 17 G PO PACK: 17.0000 g | PACK | Freq: Every day | ORAL | Status: DC | PRN

## 2022-04-02 MED ORDER — HYDROMORPHONE HCL 2 MG/ML IJ SOLN
2.0000 mg | INTRAMUSCULAR | Status: AC
  Administered 2022-04-02: 2 mg via INTRAVENOUS
  Filled 2022-04-02: qty 1

## 2022-04-02 MED ORDER — KETOROLAC TROMETHAMINE 15 MG/ML IJ SOLN
15.0000 mg | Freq: Four times a day (QID) | INTRAMUSCULAR | Status: DC
  Administered 2022-04-02 – 2022-04-04 (×8): 15 mg via INTRAVENOUS
  Filled 2022-04-02 (×8): qty 1

## 2022-04-02 MED ORDER — VITAMIN B-12 1000 MCG PO TABS
500.0000 ug | ORAL_TABLET | Freq: Every day | ORAL | Status: DC
  Administered 2022-04-02 – 2022-04-03 (×2): 500 ug via ORAL
  Filled 2022-04-02 (×2): qty 1

## 2022-04-02 MED ORDER — ALBUTEROL SULFATE HFA 108 (90 BASE) MCG/ACT IN AERS: 2.0000 | INHALATION_SPRAY | Freq: Four times a day (QID) | RESPIRATORY_TRACT | Status: DC | PRN

## 2022-04-02 MED ORDER — ALBUTEROL SULFATE (2.5 MG/3ML) 0.083% IN NEBU: 2.5000 mg | INHALATION_SOLUTION | Freq: Four times a day (QID) | RESPIRATORY_TRACT | Status: DC | PRN

## 2022-04-02 MED ORDER — ONDANSETRON HCL 4 MG/2ML IJ SOLN: 4.0000 mg | INTRAMUSCULAR | Status: DC | PRN

## 2022-04-02 MED ORDER — NALOXONE HCL 0.4 MG/ML IJ SOLN: 0.4000 mg | INTRAMUSCULAR | Status: DC | PRN

## 2022-04-02 MED ORDER — FOLIC ACID 1 MG PO TABS
1.0000 mg | ORAL_TABLET | Freq: Every day | ORAL | Status: DC
  Administered 2022-04-02 – 2022-04-03 (×2): 1 mg via ORAL
  Filled 2022-04-02 (×2): qty 1

## 2022-04-02 MED ORDER — DEFERASIROX 360 MG PO TABS: 1080.0000 mg | ORAL_TABLET | Freq: Every day | ORAL | Status: DC

## 2022-04-02 MED ORDER — VITAMIN D 25 MCG (1000 UNIT) PO TABS
1000.0000 [IU] | ORAL_TABLET | Freq: Every day | ORAL | Status: DC
  Administered 2022-04-02 – 2022-04-03 (×2): 1000 [IU] via ORAL
  Filled 2022-04-02 (×2): qty 1

## 2022-04-02 NOTE — Telephone Encounter (Signed)
Hydromorphone  

## 2022-04-02 NOTE — H&P (Signed)
H&P  Patient Demographics:  Molly Gutierrez, is a 41 y.o. female  MRN: [id number]   DOB - [birth year]  Admit Date - 04/02/2022  Outpatient Primary MD for the patient is Fenton Foy, NP  Chief Complaint  Patient presents with   Sickle Cell Pain Crisis      HPI:   Molly Gutierrez  is a 41 y.o. female with a medical history significant for sickle cell disease, chronic pain syndrome, opiate dependence and tolerance, history of anemia of chronic disease, history of mild intermittent asthma, and history of PE on Xarelto presents with complaints of upper and lower extremity pain over the past several days.  Patient states that pain intensity increased several days ago and has been uncontrolled by home medications.  She attributes pain crisis to cold weather.  She states that she typically has more sickle cell crisis episodes during winter months.  She rates her pain as 8/10.  She last had Dilaudid this a.m. without sustained relief.  She reports being diagnosed with COVID-19 around 3 weeks ago.  Patient is currently asymptomatic.  She denies any shortness of breath, fever, chills, or rigors.  No headache, fatigue, urinary symptoms, vomiting, or diarrhea.  No recent travel or sick contacts.  ER course: Vital signs recorded as:BP 109/75 (BP Location: Left Arm)   Pulse 81   Temp 98.7 F (37.1 C) (Oral)   Resp 13   SpO2 92%   CBC notable for WBCs 14.5, hemoglobin 8.1, and platelet count 560,000.  BMP shows potassium at 3.4, otherwise unremarkable.  Patient's pain persists despite IV Dilaudid, IV fluids, and IV Toradol.  Patient thereby admitted to [facility] for sickle cell pain crisis.   Review of systems:  In addition to the HPI above, patient reports No fever or chills No Headache, No changes with vision or hearing No problems swallowing food or liquids No chest pain, cough or shortness of breath No abdominal pain, No nausea or vomiting, Bowel movements are regular No  blood in stool or urine No dysuria No new skin rashes or bruises No new joints pains-aches No new weakness, tingling, numbness in any extremity No recent weight gain or loss No polyuria, polydypsia or polyphagia No significant Mental Stressors  With Past History of the following :   Past Medical History:  Diagnosis Date   Asthma    Eczema    History of pulmonary embolus (PE)    Sickle cell anemia (HCC)       Past Surgical History:  Procedure Laterality Date   CHOLECYSTECTOMY     ERCP     JOINT REPLACEMENT     PORTA CATH INSERTION     TUBAL LIGATION     WISDOM TOOTH EXTRACTION       Social History:   Social History   Tobacco Use   Smoking status: Never   Smokeless tobacco: Never  Substance Use Topics   Alcohol use: Never     Lives - At home   Family History :   Family History  Problem Relation Age of Onset   Renal Disease Mother    Hypertension Mother    High Cholesterol Mother    Heart attack Mother    Diabetes Brother      Home Medications:   Prior to Admission medications   Medication Sig Start Date End Date Taking? Authorizing Provider  acetaminophen (TYLENOL) 500 MG tablet Take 500 mg by mouth every 6 (six) hours as needed for mild pain or headache (  or cramps).   Yes [provider]  albuterol (VENTOLIN HFA) 108 (90 Base) MCG/ACT inhaler Inhale 2 puffs into the lungs every 6 (six) hours as needed for wheezing or shortness of breath. 10/29/21  Yes Fenton Foy, NP  Cholecalciferol (VITAMIN D3 PO) Take 1 capsule by mouth at bedtime.   Yes [provider]  Cyanocobalamin (VITAMIN B-12 PO) Take 1 tablet by mouth at bedtime.   Yes [provider]  DEFERASIROX PO Take 1 tablet by mouth 3 (three) times daily.   Yes [provider]  diphenhydrAMINE (BENADRYL) 25 MG tablet Take 25 mg by mouth every 4 (four) hours as needed for itching (with hydromorphone dose).   Yes [provider]  fluticasone (FLONASE) 50  MCG/ACT nasal spray Place 1 spray into both nostrils daily as needed for allergies or rhinitis. 12/31/21 04/02/22 Yes Fenton Foy, NP  FOLIC ACID PO Take 1 tablet by mouth at bedtime.   Yes [provider]  HYDROmorphone (DILAUDID) 4 MG tablet Take 4-8 mg by mouth every 4 (four) hours as needed (for pain).   Yes [provider]  mirtazapine (REMERON) 45 MG tablet Take 1 tablet (45 mg total) by mouth at bedtime. 10/29/21 10/29/22 Yes Fenton Foy, NP  mometasone-formoterol (DULERA) 100-5 MCG/ACT AERO Inhale 2 puffs into the lungs daily as needed for wheezing or shortness of breath. 10/29/21 10/29/22 Yes Fenton Foy, NP  naloxone [facility]) nasal spray 4 mg/0.1 mL Place 0.4 mg into the nose once as needed (opioid overdose). 11/01/21  Yes [provider]  polyethylene glycol (MIRALAX / GLYCOLAX) 17 g packet Take 17 g by mouth daily as needed for mild constipation (MIX AS DIRECTED AND DRINK).   Yes [provider]  promethazine (PHENERGAN) 25 MG tablet Take 0.5-1 tablets (12.5-25 mg total) by mouth every 6 (six) hours as needed for nausea or vomiting. Patient taking differently: Take 25 mg by mouth every 4 (four) hours as needed for nausea or vomiting (with hydromorphone dose). 10/31/21 10/31/22 Yes Fenton Foy, NP  XARELTO 20 MG TABS tablet Take 1 tablet (20 mg total) by mouth daily with supper. Patient taking differently: Take 20 mg by mouth at bedtime. 10/29/21 10/29/22 Yes Fenton Foy, NP  Deferasirox 360 MG TABS Take 1,080 mg by mouth at bedtime. Exjade Patient not taking: Reported on 04/02/2022 04/23/21   [provider]     Allergies:   Allergies  Allergen Reactions   Cefaclor Hives and Swelling   Hydroxyurea Palpitations and Other (See Comments)    Lowers "blood levels" and heart rate (causes HYPOtension); "it messes me up, it drops my levels and stuff"    Omeprazole Other (See Comments)    Causes "sharp pains in the stomach"    Ketamine Palpitations and Other (See Comments)    "Pt states she has had previous reaction to ketamine. States she becomes flushed, heart races, dizzy, and feels like she is going to pass out."     Physical Exam:   Vitals:   Vitals:   04/02/22 1501 04/02/22 1517  BP: 109/75   Pulse: 81   Resp: 16 13  Temp: 98.7 F (37.1 C)   SpO2: 93% 92%    Physical Exam: Constitutional: Patient appears well-developed and well-nourished. Not in obvious distress. HENT: Normocephalic, atraumatic, External right and left ear normal. Oropharynx is clear and moist.  Eyes: Conjunctivae and EOM are normal. PERRLA, no scleral icterus. Neck: Normal ROM. Neck supple. No JVD. No  tracheal deviation. No thyromegaly. CVS: RRR, S1/S2 +, no murmurs, no gallops, no carotid bruit.  Pulmonary: Effort and breath sounds normal, no stridor, rhonchi, wheezes, rales.  Abdominal: Soft. BS +, no distension, tenderness, rebound or guarding.  Musculoskeletal: Normal range of motion. No edema and no tenderness.  Lymphadenopathy: No lymphadenopathy noted, cervical, inguinal or axillary Neuro: Alert. Normal reflexes, muscle tone coordination. No cranial nerve deficit. Skin: Skin is warm and dry. No rash noted. Not diaphoretic. No erythema. No pallor. Psychiatric: Normal mood and affect. Behavior, judgment, thought content normal.   Data Review:   CBC Recent Labs  Lab 04/02/22 1051  WBC 14.5*  HGB 8.1*  HCT 22.6*  PLT 560*  MCV 90.4  MCH 32.4  MCHC 35.8  RDW 23.3*  LYMPHSABS 2.2  MONOABS 1.5*  EOSABS 0.2  BASOSABS 0.1   ------------------------------------------------------------------------------------------------------------------  Chemistries  Recent Labs  Lab 04/02/22 1051  NA 139  K 3.4*  CL 110  CO2 24  GLUCOSE 101*  BUN 10  CREATININE 0.45  CALCIUM 8.8*   ------------------------------------------------------------------------------------------------------------------ CrCl cannot be  calculated (Unknown ideal weight.). ------------------------------------------------------------------------------------------------------------------ No results for input(s): "TSH", "T4TOTAL", "T3FREE", "THYROIDAB" in the last 72 hours.  Invalid input(s): "FREET3"  Coagulation profile No results for input(s): "INR", "PROTIME" in the last 168 hours. ------------------------------------------------------------------------------------------------------------------- No results for input(s): "DDIMER" in the last 72 hours. -------------------------------------------------------------------------------------------------------------------  Cardiac Enzymes No results for input(s): "CKMB", "TROPONINI", "MYOGLOBIN" in the last 168 hours.  Invalid input(s): "CK" ------------------------------------------------------------------------------------------------------------------    Component Value Date/Time   BNP 70.6 05/05/2019 0204    ---------------------------------------------------------------------------------------------------------------  Urinalysis    Component Value Date/Time   COLORURINE YELLOW 12/11/2021 2103   APPEARANCEUR CLEAR 12/11/2021 2103   LABSPEC 1.011 12/11/2021 2103   PHURINE 5.0 12/11/2021 2103   GLUCOSEU NEGATIVE 12/11/2021 2103   HGBUR NEGATIVE 12/11/2021 2103   BILIRUBINUR NEGATIVE 12/11/2021 2103   BILIRUBINUR neg 08/03/2020 1429   KETONESUR NEGATIVE 12/11/2021 2103   PROTEINUR NEGATIVE 12/11/2021 2103   UROBILINOGEN 1.0 08/03/2020 1429   NITRITE NEGATIVE 12/11/2021 2103   LEUKOCYTESUR NEGATIVE 12/11/2021 2103    ----------------------------------------------------------------------------------------------------------------   Imaging Results:    No results found.   Assessment & Plan:  Principal Problem:   Sickle cell disease with crisis ([place]) Active Problems:   Sickle cell pain crisis (HCC)   Chronic pain syndrome   Sickle cell disease with pain  crisis: Admit patient.  Initiate IV Dilaudid PCA with weight-based settings. Toradol 15 mg IV every 6 hours IV fluids, 0.45% saline at 75 mL/h Hold home Dilaudid, will restart as pain intensity improves. Monitor vital signs very closely, reevaluate pain scale regularly, and supplemental oxygen as needed. Patient will be reevaluated in the context of function and relationship to baseline as care progresses.  Anemia of chronic disease: Hemoglobin 8.1, consistent with patient's baseline.  No clinical indication for blood transfusion at this time.  Follow labs in AM.  Leukocytosis: WBCs 14.5.  Patient afebrile.  No signs of acute infection.  Follow labs closely in AM.  History of pulmonary embolism: Continue Xarelto  Hypokalemia: Potassium slightly decreased at 3.4.  No repletion at this time.  Follow in AM.  History of mild intermittent asthma: Stable.  Continue home medications as needed.  Thrombocytosis: Platelets chronically elevated.  More than likely secondary to sickle cell disease.  Monitor closely.   DVT Prophylaxis: Subcut Lovenox   AM Labs Ordered, also please review Full Orders  Family Communication: Admission, patient's condition and plan of care including tests being  ordered have been discussed with the patient who indicate understanding and agree with the plan and Code Status.  Code Status: Full Code  Consults called: None    Admission status: Inpatient    Time spent in minutes : 30 minutes   [place], MSN, FNP-C [facility] [address] [place], [place] [address] [phone]  04/02/2022 at 6:06 PM

## 2022-04-02 NOTE — ED Provider Notes (Signed)
[facility] DEPT Provider Note   CSN: [id number] Arrival date & time: 04/02/22  1027     History  Chief Complaint  Patient presents with   Sickle Cell Pain Crisis    Molly Gutierrez is a 41 y.o. female.  41 year old female with prior medical history as detailed below presents for evaluation.  Patient with longstanding history of sickle cell disease.  Patient reports onset of sickle cell painful crisis over the last 24 hours.  Patient reports pain is primarily in her legs and lower back.  She reports that her normal home pain medication is adequate for pain control.  Her last dose of pain medication taken at home was late last night.  She has not yet taken any medicine this morning.  She reports that she contacted sickle cell clinic at approximately 8 AM and was told that they had no spots for her today.  She was advised to come to the ED for evaluation and treatment.  She denies fever.  She denies chest pain.  She denies shortness of breath.  The history is provided by the patient and medical records.  Sickle Cell Pain Crisis Location:  Lower extremity and back Severity:  Moderate Onset quality:  Gradual Duration:  3 days      Home Medications Prior to Admission medications   Medication Sig Start Date End Date Taking? Authorizing Provider  acetaminophen (TYLENOL) 500 MG tablet Take 500 mg by mouth every 6 (six) hours as needed (cramps).    [provider]  albuterol (VENTOLIN HFA) 108 (90 Base) MCG/ACT inhaler Inhale 2 puffs into the lungs every 6 (six) hours as needed for wheezing or shortness of breath. 10/29/21   Fenton Foy, NP  Cholecalciferol (VITAMIN D3 PO) Take 1 tablet by mouth at bedtime.    [provider]  Cyanocobalamin (VITAMIN B-12 PO) Take 1 tablet by mouth at bedtime.    [provider]  Deferasirox 360 MG TABS Take 1,080 mg by mouth at bedtime. Exjade 04/23/21   [provider]   diphenhydrAMINE (BENADRYL) 25 MG tablet Take 25 mg by mouth every 4 (four) hours as needed for itching (with hydromorphone dose).    [provider]  fluticasone (FLONASE) 50 MCG/ACT nasal spray Place 1 spray into both nostrils daily as needed for allergies or rhinitis. Patient not taking: Reported on 02/05/2022 12/31/21 01/30/22  Fenton Foy, NP  FOLIC ACID PO Take 1 tablet by mouth at bedtime.    [provider]  mirtazapine (REMERON) 45 MG tablet Take 1 tablet (45 mg total) by mouth at bedtime. 10/29/21 10/29/22  Fenton Foy, NP  mometasone-formoterol (DULERA) 100-5 MCG/ACT AERO Inhale 2 puffs into the lungs daily as needed for wheezing or shortness of breath. 10/29/21 10/29/22  Fenton Foy, NP  naloxone [facility]) nasal spray 4 mg/0.1 mL Place 0.4 mg into the nose once as needed (opioid overdose). 11/01/21   [provider]  polyethylene glycol (MIRALAX / GLYCOLAX) 17 g packet Take 17 g by mouth daily as needed for mild constipation.    [provider]  promethazine (PHENERGAN) 25 MG tablet Take 0.5-1 tablets (12.5-25 mg total) by mouth every 6 (six) hours as needed for nausea or vomiting. Patient taking differently: Take 25 mg by mouth every 4 (four) hours as needed for nausea or vomiting (with hydromorphone dose). 10/31/21 10/31/22  Fenton Foy, NP  XARELTO 20 MG TABS tablet Take 1 tablet (20 mg total) by mouth daily  with supper. Patient taking differently: Take 20 mg by mouth at bedtime. 10/29/21 10/29/22  Fenton Foy, NP      Allergies    Cefaclor, Hydroxyurea, Omeprazole, and Ketamine    Review of Systems   Review of Systems  All other systems reviewed and are negative.   Physical Exam Updated Vital Signs BP 117/76 (BP Location: Left Arm)   Pulse 82   Temp 98.9 F (37.2 C) (Oral)   Resp 18   SpO2 95%  Physical Exam Vitals and nursing note reviewed.  Constitutional:      General: She is not in acute distress.    Appearance:  Normal appearance. She is well-developed.  HENT:     Head: Normocephalic and atraumatic.  Eyes:     Conjunctiva/sclera: Conjunctivae normal.     Pupils: Pupils are equal, round, and reactive to light.  Cardiovascular:     Rate and Rhythm: Normal rate and regular rhythm.     Heart sounds: Normal heart sounds.  Pulmonary:     Effort: Pulmonary effort is normal. No respiratory distress.     Breath sounds: Normal breath sounds.  Abdominal:     General: There is no distension.     Palpations: Abdomen is soft.     Tenderness: There is no abdominal tenderness.  Musculoskeletal:        General: No deformity. Normal range of motion.     Cervical back: Normal range of motion and neck supple.  Skin:    General: Skin is warm and dry.  Neurological:     General: No focal deficit present.     Mental Status: She is alert and oriented to person, place, and time.     ED Results / Procedures / Treatments   Labs (all labs ordered are listed, but only abnormal results are displayed) Labs Reviewed  CBC WITH DIFFERENTIAL/PLATELET - Abnormal; Notable for the following components:      Result Value   WBC 14.5 (*)    RBC 2.50 (*)    Hemoglobin 8.1 (*)    HCT 22.6 (*)    RDW 23.3 (*)    Platelets 560 (*)    nRBC 0.8 (*)    Neutro Abs 10.5 (*)    Monocytes Absolute 1.5 (*)    All other components within normal limits  RETICULOCYTES - Abnormal; Notable for the following components:   Retic Ct Pct 19.4 (*)    RBC. 2.53 (*)    Retic Count, Absolute 493.5 (*)    Immature Retic Fract 17.1 (*)    All other components within normal limits  BASIC METABOLIC PANEL - Abnormal; Notable for the following components:   Potassium 3.4 (*)    Glucose, Bld 101 (*)    Calcium 8.8 (*)    All other components within normal limits  I-STAT BETA HCG BLOOD, ED ([facility], [facility], [facility] ONLY)    EKG None  Radiology No results found.  Procedures Procedures    Medications Ordered in ED Medications  0.45 % sodium  chloride infusion ( Intravenous New Bag/Given 04/02/22 1115)  ondansetron (ZOFRAN) injection 4 mg (has no administration in time range)  heparin lock flush 100 UNIT/ML injection (has no administration in time range)  HYDROmorphone (DILAUDID) injection 2 mg (2 mg Intravenous Given 04/02/22 1123)  HYDROmorphone (DILAUDID) injection 2 mg (2 mg Intravenous Given 04/02/22 1200)  diphenhydrAMINE (BENADRYL) 12.5 mg in sodium chloride 0.9 % 50 mL IVPB (0 mg Intravenous Stopped 04/02/22 1157)  HYDROmorphone (DILAUDID) injection  2 mg (2 mg Intravenous Given 04/02/22 1251)    ED Course/ Medical Decision Making/ A&P                           Medical Decision Making Amount and/or Complexity of Data Reviewed Labs: ordered.  Risk Prescription drug management. Decision regarding hospitalization.    Medical Screen Complete  This patient presented to the ED with complaint of sickle cell painful crisis.  This complaint involves an extensive number of treatment options. The initial differential diagnosis includes, but is not limited to, sickle cell painful crisis, metabolic abnormality, infection, etc.  This presentation is: Acute, Chronic, Self-Limited, Previously Undiagnosed, Uncertain Prognosis, Complicated, Systemic Symptoms, and Threat to Life/Bodily Function  Patient is presenting with complaint of sickle cell painful crisis.  Patient's pain is localized to the bilateral legs and low back.  Patient without reported recent fever or chills.  Screening labs obtained are without significant acute abnormality.  Patient given multiple doses of Dilaudid here in the ED.   Patient is that her pain is not adequately controlled.  She is requesting admission for pain control.  [place] Hollis NP made aware of case and she will evaluate the patient for admission.  Additional history obtained:  External records from outside sources obtained and reviewed including prior ED visits and prior Inpatient  records.    Lab Tests:  I ordered and personally interpreted labs.  The pertinent results include: CBC, retake, BMP, hCG   Cardiac Monitoring:  The patient was maintained on a cardiac monitor.  I personally viewed and interpreted the cardiac monitor which showed an underlying rhythm of: NSR   Medicines ordered:  I ordered medication including narcotics, antihistamines for pain Reevaluation of the patient after these medicines showed that the patient: improved  Problem List / ED Course:  Sickle cell painful crisis   Reevaluation:  After the interventions noted above, I reevaluated the patient and found that they have: improved  Disposition:  After consideration of the diagnostic results and the patients response to treatment, I feel that the patent would benefit from admission.          Final Clinical Impression(s) / ED Diagnoses Final diagnoses:  Sickle cell pain crisis [facility])    Rx / DC Orders ED Discharge Orders     None         Valarie Merino, MD 04/02/22 1347

## 2022-04-02 NOTE — ED Triage Notes (Signed)
SCC with pain in legs and back, states it is usually in her chest. Started last night

## 2022-04-03 LAB — BASIC METABOLIC PANEL
Anion gap: 6 (ref 5–15)
BUN: 12 mg/dL (ref 6–20)
CO2: 25 mmol/L (ref 22–32)
Calcium: 8.6 mg/dL — ABNORMAL LOW (ref 8.9–10.3)
Chloride: 106 mmol/L (ref 98–111)
Creatinine, Ser: 0.63 mg/dL (ref 0.44–1.00)
GFR, Estimated: >60 mL/min (ref >60)
Glucose, Bld: 103 mg/dL — ABNORMAL HIGH (ref 70–99)
Potassium: 3.3 mmol/L — ABNORMAL LOW (ref 3.5–5.1)
Sodium: 137 mmol/L (ref 135–145)

## 2022-04-03 LAB — CBC
HCT: 20.8 % — ABNORMAL LOW (ref 36.0–46.0)
Hemoglobin: 7.4 g/dL — ABNORMAL LOW (ref 12.0–15.0)
MCH: 32.2 pg (ref 26.0–34.0)
MCHC: 35.6 g/dL (ref 30.0–36.0)
MCV: 90.4 fL (ref 80.0–100.0)
Platelets: 509 10*3/uL — ABNORMAL HIGH (ref 150–400)
RBC: 2.3 MIL/uL — ABNORMAL LOW (ref 3.87–5.11)
RDW: 21.9 % — ABNORMAL HIGH (ref 11.5–15.5)
WBC: 14.9 10*3/uL — ABNORMAL HIGH (ref 4.0–10.5)
nRBC: 0.6 % — ABNORMAL HIGH (ref 0.0–0.2)

## 2022-04-03 MED ORDER — HYDROMORPHONE HCL 2 MG PO TABS
4.0000 mg | ORAL_TABLET | ORAL | Status: DC | PRN
  Administered 2022-04-03 – 2022-04-04 (×4): 4 mg via ORAL
  Filled 2022-04-03 (×4): qty 2

## 2022-04-03 MED ORDER — HYDROMORPHONE 1 MG/ML IV SOLN
INTRAVENOUS | Status: DC
  Filled 2022-04-03 (×2): qty 30

## 2022-04-03 NOTE — Progress Notes (Signed)
  Transition of Care [facility]) Screening Note   Patient Details  Name: Molly Gutierrez Date of Birth: 1980/07/28   Transition of Care [facility]) CM/SW Contact:    Vassie Moselle, LCSW Phone Number: 04/03/2022, 11:42 AM    Transition of Care Department [facility]) has reviewed patient and no TOC needs have been identified at this time. We will continue to monitor patient advancement through interdisciplinary progression rounds. If new patient transition needs arise, please place a TOC consult.

## 2022-04-03 NOTE — Progress Notes (Signed)
Mobility Specialist - Progress Note   04/03/22 1113  Mobility  Activity Ambulated independently in hallway  Level of Assistance Modified independent, requires aide device or extra time  Assistive Device Other (Comment) (IV pole)  Distance Ambulated (ft) 3000 ft  Activity Response Tolerated well  Mobility Referral Yes  $Mobility charge 1 Mobility   Pt received in bed and agreed to mobility, no c/o pain nor discomfort during ambulation. Pt back to bed with all needs met.   Roderick Pee Mobility Specialist

## 2022-04-03 NOTE — Progress Notes (Signed)
Subjective: Molly Gutierrez is a 41 year old female with a medical history significant for sickle cell disease, chronic pain syndrome, opiate dependence and mild intermittent asthma, history of PE on Xarelto was admitted for sickle cell pain crisis  Patient states that pain intensity has improved some overnight.  She rates her pain as 7/10 primarily to upper and lower extremities.  She denies headache, chest pain, urinary symptoms, nausea, vomiting, or diarrhea.  Objective:  Vital signs in last 24 hours:  Vitals:   04/03/22 0407 04/03/22 0939 04/03/22 0958 04/03/22 1134  BP:   98/74 98/74  Pulse:   71 71  Resp: '16  20 20  '$ Temp:   97.9 F (36.6 C) 97.9 F (36.6 C)  TempSrc:   Oral Oral  SpO2: 96% 94% 99%   Weight:    51 kg  Height:    5' 2.99" (1.6 m)    Intake/Output from previous day:   Intake/Output Summary (Last 24 hours) at 04/03/2022 1216 Last data filed at 04/03/2022 0300 Gross per 24 hour  Intake 837.87 ml  Output --  Net 837.87 ml    Physical Exam: General: Alert, awake, oriented x3, in no acute distress.  HEENT: [place]/AT PEERL, EOMI Neck: Trachea midline,  no masses, no thyromegal,y no JVD, no carotid bruit OROPHARYNX:  Moist, No exudate/ erythema/lesions.  Heart: Regular rate and rhythm, without murmurs, rubs, gallops, PMI non-displaced, no heaves or thrills on palpation.  Lungs: Clear to auscultation, no wheezing or rhonchi noted. No increased vocal fremitus resonant to percussion  Abdomen: Soft, nontender, nondistended, positive bowel sounds, no masses no hepatosplenomegaly noted..  Neuro: No focal neurological deficits noted cranial nerves II through XII grossly intact. DTRs 2+ bilaterally upper and lower extremities. Strength 5 out of 5 in bilateral upper and lower extremities. Musculoskeletal: No warm swelling or erythema around joints, no spinal tenderness noted. Psychiatric: Patient alert and oriented x3, good insight and cognition, good recent to remote  recall. Lymph node survey: No cervical axillary or inguinal lymphadenopathy noted.  Lab Results:  Basic Metabolic Panel:    Component Value Date/Time   NA 137 04/03/2022 0305   NA 137 08/02/2020 1538   K 3.3 (L) 04/03/2022 0305   CL 106 04/03/2022 0305   CO2 25 04/03/2022 0305   BUN 12 04/03/2022 0305   BUN 7 08/02/2020 1538   CREATININE 0.63 04/03/2022 0305   GLUCOSE 103 (H) 04/03/2022 0305   CALCIUM 8.6 (L) 04/03/2022 0305   CBC:    Component Value Date/Time   WBC 14.9 (H) 04/03/2022 0305   HGB 7.4 (L) 04/03/2022 0305   HGB 7.8 (L) 08/02/2020 1538   HCT 20.8 (L) 04/03/2022 0305   HCT 22.3 (L) 08/02/2020 1538   PLT 509 (H) 04/03/2022 0305   PLT 560 (H) 08/02/2020 1538   MCV 90.4 04/03/2022 0305   MCV 92 08/02/2020 1538   NEUTROABS 10.5 (H) 04/02/2022 1051   NEUTROABS 9.5 (H) 08/02/2020 1538   LYMPHSABS 2.2 04/02/2022 1051   LYMPHSABS 3.0 08/02/2020 1538   MONOABS 1.5 (H) 04/02/2022 1051   EOSABS 0.2 04/02/2022 1051   EOSABS 0.4 08/02/2020 1538   BASOSABS 0.1 04/02/2022 1051   BASOSABS 0.1 08/02/2020 1538    No results found for this or any previous visit (from the past 240 hour(s)).  Studies/Results: No results found.  Medications: Scheduled Meds:  Chlorhexidine Gluconate Cloth  6 each Topical Daily   vitamin D3  1,000 Units Oral QHS   vitamin B-12  500 mcg Oral  QHS   folic acid  1 mg Oral QHS   HYDROmorphone   Intravenous Q4H   ketorolac  15 mg Intravenous Q6H   mirtazapine  45 mg Oral QHS   rivaroxaban  20 mg Oral QHS   senna-docusate  1 tablet Oral BID   Continuous Infusions:  sodium chloride 75 mL/hr at 04/03/22 0508   PRN Meds:.albuterol, diphenhydrAMINE, naloxone **AND** sodium chloride flush, ondansetron (ZOFRAN) IV, polyethylene glycol, sodium chloride flush  Consultants: none  Procedures: none  Antibiotics: none  Assessment/Plan: Principal Problem:   Sickle cell disease with crisis ([place]) Active Problems:   Sickle cell pain crisis  (HCC)   Chronic pain syndrome  Sickle cell disease with pain crisis: Pain intensity improving, weaning IV Dilaudid PCA settings decreased today. Restart patient's home medications Dilaudid 4 mg by mouth every 4 hours as needed for severe breakthrough pain Toradol 15 mg IV every 6 hours for total of 5 days Decrease IV fluids to KVO  Anemia of chronic disease: Today, patient's hemoglobin is 7.3 g/dL, which is below her baseline.  We will transfuse patient if hemoglobin is below 6.5 g/dL.  Follow labs in AM.  Leukocytosis: Stable.  Patient afebrile.  No signs of acute infection.  Follow labs in AM.  History of pulmonary embolism: Continue Xarelto  Hypokalemia: Potassium decreased.  Replete.  Labs in AM.  History of mild intermittent asthma: Stable.  Continue home medications as needed.  Thrombocytosis: Platelets chronically elevated.  More than likely secondary to sickle cell disease.  Continue to monitor closely.  Code Status: Full Code Family Communication: N/A Disposition Plan: Not yet ready for discharge  [place], MSN, FNP-C Patient [place] [address] [place],  [id number] [phone]  If 7PM-7AM, please contact night-coverage.  04/03/2022, 12:16 PM  LOS: 1 day

## 2022-04-04 ENCOUNTER — Other Ambulatory Visit: Payer: Self-pay | Admitting: Family Medicine

## 2022-04-04 DIAGNOSIS — D5700 Hb-SS disease with crisis, unspecified: Secondary | ICD-10-CM | POA: Diagnosis not present

## 2022-04-04 LAB — CBC
HCT: 19.4 % — ABNORMAL LOW (ref 36.0–46.0)
Hemoglobin: 7.1 g/dL — ABNORMAL LOW (ref 12.0–15.0)
MCH: 32.9 pg (ref 26.0–34.0)
MCHC: 36.6 g/dL — ABNORMAL HIGH (ref 30.0–36.0)
MCV: 89.8 fL (ref 80.0–100.0)
Platelets: 472 10*3/uL — ABNORMAL HIGH (ref 150–400)
RBC: 2.16 MIL/uL — ABNORMAL LOW (ref 3.87–5.11)
RDW: 20.9 % — ABNORMAL HIGH (ref 11.5–15.5)
WBC: 12.2 10*3/uL — ABNORMAL HIGH (ref 4.0–10.5)
nRBC: 0.6 % — ABNORMAL HIGH (ref 0.0–0.2)

## 2022-04-04 LAB — BASIC METABOLIC PANEL
Anion gap: 3 — ABNORMAL LOW (ref 5–15)
BUN: 14 mg/dL (ref 6–20)
CO2: 26 mmol/L (ref 22–32)
Calcium: 8.3 mg/dL — ABNORMAL LOW (ref 8.9–10.3)
Chloride: 108 mmol/L (ref 98–111)
Creatinine, Ser: 0.54 mg/dL (ref 0.44–1.00)
GFR, Estimated: >60 mL/min (ref >60)
Glucose, Bld: 112 mg/dL — ABNORMAL HIGH (ref 70–99)
Potassium: 3.5 mmol/L (ref 3.5–5.1)
Sodium: 137 mmol/L (ref 135–145)

## 2022-04-04 MED ORDER — HYDROMORPHONE HCL 4 MG PO TABS: 4.0000 mg | ORAL_TABLET | Freq: Four times a day (QID) | ORAL | 0 refills | Status: DC | PRN

## 2022-04-04 MED ORDER — HEPARIN SOD (PORK) LOCK FLUSH 100 UNIT/ML IV SOLN
500.0000 [IU] | INTRAVENOUS | Status: AC | PRN
  Administered 2022-04-04: 500 [IU]
  Filled 2022-04-04: qty 5

## 2022-04-04 NOTE — Progress Notes (Signed)
Pt discharged to d/c lounge to await private vehicle. Dc instructions given and pt verbalized understanding.

## 2022-04-04 NOTE — Progress Notes (Addendum)
Patient vitals stable throughout shift on 2L [place]. Continued complaints of deep itching. Scratch marks on arms, pt states she made herself bleed. Two doses of Benadryl given during shift. Patient states oral benadryl doesn't work as well as IV Benadryl. Continued current oral dosing per night provider. 2nd dose administered one hour early due to itching complaints per Zebedee Iba NP. Recommend pt discuss with day team if continued itching during day. Patient using PCA for pain control, no complications. Educated patient on importance of incentive spirometer and instructed how to use. Awake most of the night. Ambulated to bathroom without difficulty. Erling Conte, RN

## 2022-04-08 NOTE — Discharge Summary (Signed)
Physician Discharge Summary  [facility] YQM:[id number] DOB: 10/17/1980 DOA: 04/02/2022  PCP: Fenton Foy, NP  Admit date: 04/02/2022  Discharge date: 04/04/2022  Discharge Diagnoses:  Principal Problem:   Sickle cell disease with crisis [facility]) Active Problems:   Sickle cell pain crisis ([place])   Chronic pain syndrome   Discharge Condition: Stable  Disposition:  Pt is discharged home in good condition and is to follow up with Fenton Foy, NP this week to have labs evaluated. Molly Gutierrez is instructed to increase activity slowly and balance with rest for the next few days, and use prescribed medication to complete treatment of pain  Diet: Regular Wt Readings from Last 3 Encounters:  04/03/22 51 kg  02/20/22 51 kg  02/04/22 53.8 kg    History of present illness:  Molly Gutierrez  is a 41 y.o. female with a medical history significant for sickle cell disease, chronic pain syndrome, opiate dependence and tolerance, history of anemia of chronic disease, history of mild intermittent asthma, and history of PE on Xarelto presents with complaints of upper and lower extremity pain over the past several days.  Patient states that pain intensity increased several days ago and has been uncontrolled by home medications.  She attributes pain crisis to cold weather.  She states that she typically has more sickle cell crisis episodes during winter months.  She rates her pain as 8/10.  She last had Dilaudid this a.m. without sustained relief.  She reports being diagnosed with COVID-19 around 3 weeks ago.  Patient is currently asymptomatic.  She denies any shortness of breath, fever, chills, or rigors.  No headache, fatigue, urinary symptoms, vomiting, or diarrhea.  No recent travel or sick contacts.   ER course: Vital signs recorded as:BP 109/75 (BP Location: Left Arm)   Pulse 81   Temp 98.7 F (37.1 C) (Oral)   Resp 13   SpO2 92%   CBC notable for WBCs 14.5,  hemoglobin 8.1, and platelet count 560,000.  BMP shows potassium at 3.4, otherwise unremarkable.  Patient's pain persists despite IV Dilaudid, IV fluids, and IV Toradol.  Patient thereby admitted to [facility] for sickle cell pain crisis.  Hospital Course:  Sickle cell disease with pain crisis: Patient was admitted for sickle cell pain crisis and managed appropriately with IVF, IV Dilaudid via PCA and IV Toradol, as well as other adjunct therapies per sickle cell pain management protocols.  IV Dilaudid PCA weaned appropriately and patient was transition to her home medication.  Patient states that she is out of her home medication.  Dilaudid 4 mg every 6 hours #60 was sent to patient's pharmacy.  PDMP reviewed prior to prescribing opiate medications, no inconsistencies noted.  Patient will follow-up with her PCP for medication management. Patient's hemoglobin remained stable and consistent with her baseline.  There was no clinical indication for blood transfusion during patient's admission. Patient will continue her home medications.  Vital signs are within a normal range.  Patient alert, oriented, and ambulating without assistance.  Patient was therefore discharged home today in a hemodynamically stable condition.   Molly Gutierrez will follow-up with PCP within 1 week of this discharge. Molly Gutierrez was counseled extensively about nonpharmacologic means of pain management, patient verbalized understanding and was appreciative of  the care received during this admission.   We discussed the need for good hydration, monitoring of hydration status, avoidance of heat, cold, stress, and infection triggers. We discussed the need to be adherent with taking Hydrea and other  home medications. Patient was reminded of the need to seek medical attention immediately if any symptom of bleeding, anemia, or infection occurs.  Discharge Exam: Vitals:   04/04/22 1143 04/04/22 1303  BP:  93/60  Pulse:  71  Resp:  11 10  Temp:  98.7 F (37.1 C)  SpO2: 99% 94%   Vitals:   04/04/22 0839 04/04/22 0914 04/04/22 1143 04/04/22 1303  BP:  (!) 99/58  93/60  Pulse:  72  71  Resp: 11 14 11 10   Temp:  98.4 F (36.9 C)  98.7 F (37.1 C)  TempSrc:  Oral  Oral  SpO2: 98% 99% 99% 94%  Weight:      Height:        General appearance : Awake, alert, not in any distress. Speech Clear. Not toxic looking HEENT: Atraumatic and Normocephalic, pupils equally reactive to light and accomodation Neck: Supple, no JVD. No cervical lymphadenopathy.  Chest: Good air entry bilaterally, no added sounds  CVS: S1 S2 regular, no murmurs.  Abdomen: Bowel sounds present, Non tender and not distended with no gaurding, rigidity or rebound. Extremities: B/L Lower Ext shows no edema, both legs are warm to touch Neurology: Awake alert, and oriented X 3, CN II-XII intact, Non focal Skin: No Rash  Discharge Instructions  Discharge Instructions     Discharge patient   Complete by: As directed    Discharge disposition: 01-Home or Self Care   Discharge patient date: 04/04/2022      Allergies as of 04/04/2022       Reactions   Cefaclor Hives, Swelling   Hydroxyurea Palpitations, Other (See Comments)   Lowers "blood levels" and heart rate (causes HYPOtension); "it messes me up, it drops my levels and stuff"   Omeprazole Other (See Comments)   Causes "sharp pains in the stomach"   Ketamine Palpitations, Other (See Comments)   "Pt states she has had previous reaction to ketamine. States she becomes flushed, heart races, dizzy, and feels like she is going to pass out."        Medication List     TAKE these medications    acetaminophen 500 MG tablet Commonly known as: TYLENOL Take 500 mg by mouth every 6 (six) hours as needed for mild pain or headache (or cramps).   albuterol 108 (90 Base) MCG/ACT inhaler Commonly known as: VENTOLIN HFA Inhale 2 puffs into the lungs every 6 (six) hours as needed for wheezing or  shortness of breath.   DEFERASIROX PO Take 1 tablet by mouth 3 (three) times daily.   Deferasirox 360 MG Tabs Take 1,080 mg by mouth at bedtime. Exjade   diphenhydrAMINE 25 MG tablet Commonly known as: BENADRYL Take 25 mg by mouth every 4 (four) hours as needed for itching (with hydromorphone dose).   fluticasone 50 MCG/ACT nasal spray Commonly known as: FLONASE Place 1 spray into both nostrils daily as needed for allergies or rhinitis.   FOLIC ACID PO Take 1 tablet by mouth at bedtime.   HYDROmorphone 4 MG tablet Commonly known as: DILAUDID Take 1 tablet (4 mg total) by mouth every 6 (six) hours as needed (for pain). What changed:  how much to take when to take this   mirtazapine 45 MG tablet Commonly known as: REMERON Take 1 tablet (45 mg total) by mouth at bedtime.   mometasone-formoterol 100-5 MCG/ACT Aero Commonly known as: DULERA Inhale 2 puffs into the lungs daily as needed for wheezing or shortness of breath.   naloxone 4  MG/0.1ML Liqd nasal spray kit Commonly known as: NARCAN Place 0.4 mg into the nose once as needed (opioid overdose).   polyethylene glycol 17 g packet Commonly known as: MIRALAX / GLYCOLAX Take 17 g by mouth daily as needed for mild constipation (MIX AS DIRECTED AND DRINK).   promethazine 25 MG tablet Commonly known as: PHENERGAN Take 0.5-1 tablets (12.5-25 mg total) by mouth every 6 (six) hours as needed for nausea or vomiting. What changed:  how much to take when to take this reasons to take this   VITAMIN B-12 PO Take 1 tablet by mouth at bedtime.   VITAMIN D3 PO Take 1 capsule by mouth at bedtime.   Xarelto 20 MG Tabs tablet Generic drug: rivaroxaban Take 1 tablet (20 mg total) by mouth daily with supper. What changed: when to take this        The results of significant diagnostics from this hospitalization (including imaging, microbiology, ancillary and laboratory) are listed below for reference.    Significant  Diagnostic Studies: No results found.  Microbiology: No results found for this or any previous visit (from the past 240 hour(s)).   Labs: Basic Metabolic Panel: Recent Labs  Lab 04/02/22 1051 04/03/22 0305 04/04/22 0419  NA 139 137 137  K 3.4* 3.3* 3.5  CL 110 106 108  CO2 24 25 26   GLUCOSE 101* 103* 112*  BUN 10 12 14   CREATININE 0.45 0.63 0.54  CALCIUM 8.8* 8.6* 8.3*   Liver Function Tests: No results for input(s): "AST", "ALT", "ALKPHOS", "BILITOT", "PROT", "ALBUMIN" in the last 168 hours. No results for input(s): "LIPASE", "AMYLASE" in the last 168 hours. No results for input(s): "AMMONIA" in the last 168 hours. CBC: Recent Labs  Lab 04/02/22 1051 04/03/22 0305 04/04/22 0419  WBC 14.5* 14.9* 12.2*  NEUTROABS 10.5*  --   --   HGB 8.1* 7.4* 7.1*  HCT 22.6* 20.8* 19.4*  MCV 90.4 90.4 89.8  PLT 560* 509* 472*   Cardiac Enzymes: No results for input(s): "CKTOTAL", "CKMB", "CKMBINDEX", "TROPONINI" in the last 168 hours. BNP: Invalid input(s): "POCBNP" CBG: No results for input(s): "GLUCAP" in the last 168 hours.  Time coordinating discharge: 30 minutes  Signed:  [name]  APRN, MSN, FNP-C Patient [place] [address] [place], [place] [address] [phone]  [facility] 04/08/2022, 5:01 PM

## 2022-04-12 ENCOUNTER — Other Ambulatory Visit: Payer: Self-pay | Admitting: Nurse Practitioner

## 2022-04-17 ENCOUNTER — Other Ambulatory Visit: Payer: Self-pay | Admitting: Nurse Practitioner

## 2022-04-17 DIAGNOSIS — D5700 Hb-SS disease with crisis, unspecified: Secondary | ICD-10-CM

## 2022-04-17 MED ORDER — HYDROMORPHONE HCL 4 MG PO TABS: 4.0000 mg | ORAL_TABLET | Freq: Four times a day (QID) | ORAL | 0 refills | Status: DC | PRN

## 2022-04-17 NOTE — Progress Notes (Signed)
PDMP reviewed. Dilaudid refilled.

## 2022-04-24 ENCOUNTER — Other Ambulatory Visit: Payer: Self-pay | Admitting: Nurse Practitioner

## 2022-04-24 DIAGNOSIS — D5700 Hb-SS disease with crisis, unspecified: Secondary | ICD-10-CM

## 2022-04-24 MED ORDER — DEFERASIROX 360 MG PO TABS: 1080.0000 mg | ORAL_TABLET | Freq: Every day | ORAL | 0 refills | Status: DC

## 2022-04-24 NOTE — Telephone Encounter (Signed)
Dilaudid is not due yet.

## 2022-04-26 ENCOUNTER — Telehealth: Payer: Self-pay

## 2022-04-26 NOTE — Telephone Encounter (Signed)
Wendi from [place] [facility] advised pt will need a PA for Deferasirox. Please call [phone] or [phone] ext [phone]. Thank you.  Marland Kitchenkh

## 2022-04-29 ENCOUNTER — Other Ambulatory Visit: Payer: Self-pay | Admitting: Nurse Practitioner

## 2022-05-01 ENCOUNTER — Other Ambulatory Visit: Payer: Self-pay | Admitting: Nurse Practitioner

## 2022-05-01 ENCOUNTER — Other Ambulatory Visit: Payer: Self-pay | Admitting: Family Medicine

## 2022-05-01 ENCOUNTER — Telehealth: Payer: Self-pay | Admitting: Nurse Practitioner

## 2022-05-01 ENCOUNTER — Telehealth: Payer: Self-pay | Admitting: Family Medicine

## 2022-05-01 DIAGNOSIS — D5700 Hb-SS disease with crisis, unspecified: Secondary | ICD-10-CM

## 2022-05-01 MED ORDER — HYDROMORPHONE HCL 4 MG PO TABS: 4.0000 mg | ORAL_TABLET | Freq: Four times a day (QID) | ORAL | 0 refills | Status: DC | PRN

## 2022-05-01 NOTE — Telephone Encounter (Signed)
Pt requesting hydrmorphone refill request

## 2022-05-01 NOTE — Telephone Encounter (Signed)
Hydromorphone

## 2022-05-02 ENCOUNTER — Other Ambulatory Visit: Payer: Self-pay

## 2022-05-03 ENCOUNTER — Other Ambulatory Visit: Payer: Self-pay

## 2022-05-13 ENCOUNTER — Other Ambulatory Visit: Payer: Self-pay | Admitting: Nurse Practitioner

## 2022-05-15 ENCOUNTER — Telehealth: Payer: Self-pay | Admitting: Nurse Practitioner

## 2022-05-15 NOTE — Telephone Encounter (Signed)
Caller & Relationship to patient:  Patient  MRN #  [id number]   Call Back Number: [phone]  Date of Last Office Visit: 05/01/2022     Date of Next Office Visit: 05/23/2022    Medication(s) to be Refilled: hyrdomorphone   [facility] [address]  ** Please notify patient to allow 48-72 hours to process** **Let patient know to contact pharmacy at the end of the day to make sure medication is ready. ** **If patient has not been seen in a year or longer, book an appointment **Advise to use MyChart for refill requests OR to contact their pharmacy

## 2022-05-16 ENCOUNTER — Other Ambulatory Visit: Payer: Self-pay

## 2022-05-16 DIAGNOSIS — D5700 Hb-SS disease with crisis, unspecified: Secondary | ICD-10-CM

## 2022-05-16 MED ORDER — HYDROMORPHONE HCL 4 MG PO TABS: 4.0000 mg | ORAL_TABLET | Freq: Four times a day (QID) | ORAL | 0 refills | Status: DC | PRN

## 2022-05-16 NOTE — Telephone Encounter (Signed)
From: [facility] To: Office of Fenton Foy, NP Sent: 05/15/2022 8:39 PM EST Subject: Medication Renewal Request  Refills have been requested for the following medications:   HYDROmorphone (DILAUDID) 4 MG tablet Molly Gutierrez]  [facility] [place] [place] - [address] Delivery method: [employer]

## 2022-05-16 NOTE — Telephone Encounter (Signed)
Caller & Relationship to patient:  MRN #  [id number]   Call Back Number:   Date of Last Office Visit: 05/15/2022     Date of Next Office Visit: 05/23/2022    Medication(s) to be Refilled: hydromorphone  Preferred Pharmacy:   ** Please notify patient to allow 48-72 hours to process** **Let patient know to contact pharmacy at the end of the day to make sure medication is ready. ** **If patient has not been seen in a year or longer, book an appointment **Advise to use MyChart for refill requests OR to contact their pharmacy

## 2022-05-23 ENCOUNTER — Non-Acute Institutional Stay (HOSPITAL_COMMUNITY)
Admission: RE | Admit: 2022-05-23 | Discharge: 2022-05-23 | Disposition: A | Discharge status: Home / Self Care | Payer: Medicare HMO | Source: Ambulatory Visit | Attending: Internal Medicine | Admitting: Internal Medicine

## 2022-05-23 ENCOUNTER — Encounter: Payer: Self-pay | Admitting: Nurse Practitioner

## 2022-05-23 ENCOUNTER — Ambulatory Visit (INDEPENDENT_AMBULATORY_CARE_PROVIDER_SITE_OTHER): Payer: Medicare HMO | Admitting: Nurse Practitioner

## 2022-05-23 VITALS — BP 108/78 | HR 97 | Temp 97.6°F | Ht 63.0 in | Wt 114.0 lb

## 2022-05-23 DIAGNOSIS — D5700 Hb-SS disease with crisis, unspecified: Secondary | ICD-10-CM

## 2022-05-23 DIAGNOSIS — Z452 Encounter for adjustment and management of vascular access device: Secondary | ICD-10-CM | POA: Diagnosis present

## 2022-05-23 DIAGNOSIS — Z13220 Encounter for screening for lipoid disorders: Secondary | ICD-10-CM | POA: Diagnosis not present

## 2022-05-23 DIAGNOSIS — E559 Vitamin D deficiency, unspecified: Secondary | ICD-10-CM

## 2022-05-23 MED ORDER — HEPARIN SOD (PORK) LOCK FLUSH 100 UNIT/ML IV SOLN
500.0000 [IU] | INTRAVENOUS | Status: AC | PRN
  Administered 2022-05-23: 500 [IU]
  Filled 2022-05-23: qty 5

## 2022-05-23 MED ORDER — SODIUM CHLORIDE 0.9% FLUSH
10.0000 mL | INTRAVENOUS | Status: AC | PRN
  Administered 2022-05-23: 10 mL

## 2022-05-23 MED ORDER — DEFERASIROX 360 MG PO TABS: 1080.0000 mg | ORAL_TABLET | Freq: Every day | ORAL | 0 refills | Status: DC

## 2022-05-23 NOTE — Patient Instructions (Addendum)
1. Sickle cell anemia with pain (HCC)  - Sickle Cell Panel  2. Vitamin D deficiency  - Sickle Cell Panel  3. Lipid screening  - Lipid Panel  Follow up:  Follow up in 3 months or sooner if needed

## 2022-05-23 NOTE — Progress Notes (Signed)
$'@Patient'c$  ID: Molly Gutierrez, female    DOB: 05/09/1981, 41 y.o.   MRN: [id number]  Chief Complaint  Patient presents with   hospital f/u    Pt stated--doing ok and other concern.    Referring provider: Fenton Foy, NP   HPI  [facility] presents for follow up  She  has a past medical history of Asthma, Eczema, History of pulmonary embolus (PE), and Sickle cell anemia ([place]).    Patient presents today for sickle cell follow-up. Patient was recently in the hospital for sickle cell crisis. We will recheck labs today. Patient did receive blood transfusion. Patient states that she has more pain during the winter months. Denies f/c/s, n/v/d, hemoptysis, PND, leg swelling Denies chest pain or edema      Allergies  Allergen Reactions   Cefaclor Hives and Swelling   Hydroxyurea Palpitations and Other (See Comments)    Lowers "blood levels" and heart rate (causes HYPOtension); "it messes me up, it drops my levels and stuff"    Omeprazole Other (See Comments)    Causes "sharp pains in the stomach"   Ketamine Palpitations and Other (See Comments)    "Pt states she has had previous reaction to ketamine. States she becomes flushed, heart races, dizzy, and feels like she is going to pass out."    Immunization History  Administered Date(s) Administered   PFIZER Comirnaty(Gray Top)Covid-19 Tri-Sucrose Vaccine 09/25/2019, 10/16/2019   PFIZER(Purple Top)SARS-COV-2 Vaccination 09/25/2019, 10/16/2019   Pfizer Covid-19 Vaccine Bivalent Booster 1yr & up 09/25/2019, 10/16/2019   Tdap 07/09/2019    Past Medical History:  Diagnosis Date   Asthma    Eczema    History of pulmonary embolus (PE)    Sickle cell anemia (HCC)     Tobacco History: Social History   Tobacco Use  Smoking Status Never  Smokeless Tobacco Never   Counseling given: Not Answered   Outpatient Encounter Medications as of 05/23/2022  Medication Sig   acetaminophen (TYLENOL) 500 MG tablet  Take 500 mg by mouth every 6 (six) hours as needed for mild pain or headache (or cramps).   albuterol (VENTOLIN HFA) 108 (90 Base) MCG/ACT inhaler Inhale 2 puffs into the lungs every 6 (six) hours as needed for wheezing or shortness of breath.   Cholecalciferol (VITAMIN D3 PO) Take 1 capsule by mouth at bedtime.   Cyanocobalamin (VITAMIN B-12 PO) Take 1 tablet by mouth at bedtime.   DEFERASIROX PO Take 1 tablet by mouth 3 (three) times daily.   diphenhydrAMINE (BENADRYL) 25 MG tablet Take 25 mg by mouth every 4 (four) hours as needed for itching (with hydromorphone dose).   FOLIC ACID PO Take 1 tablet by mouth at bedtime.   HYDROmorphone (DILAUDID) 4 MG tablet Take 1 tablet (4 mg total) by mouth every 6 (six) hours as needed (for pain).   mirtazapine (REMERON) 45 MG tablet Take 1 tablet (45 mg total) by mouth at bedtime.   mometasone-formoterol (DULERA) 100-5 MCG/ACT AERO Inhale 2 puffs into the lungs daily as needed for wheezing or shortness of breath.   naloxone (NARCAN) nasal spray 4 mg/0.1 mL Place 0.4 mg into the nose once as needed (opioid overdose).   polyethylene glycol (MIRALAX / GLYCOLAX) 17 g packet Take 17 g by mouth daily as needed for mild constipation (MIX AS DIRECTED AND DRINK).   promethazine (PHENERGAN) 25 MG tablet Take 0.5-1 tablets (12.5-25 mg total) by mouth every 6 (six) hours as needed for nausea or vomiting. (Patient taking differently: Take  25 mg by mouth every 4 (four) hours as needed for nausea or vomiting (with hydromorphone dose).)   XARELTO 20 MG TABS tablet Take 1 tablet (20 mg total) by mouth daily with supper. (Patient taking differently: Take 20 mg by mouth at bedtime.)   [DISCONTINUED] Deferasirox 360 MG TABS Take 3 tablets (1,080 mg total) by mouth at bedtime. Exjade   Deferasirox 360 MG TABS Take 3 tablets (1,080 mg total) by mouth at bedtime. Exjade   fluticasone (FLONASE) 50 MCG/ACT nasal spray Place 1 spray into both nostrils daily as needed for allergies or  rhinitis.   No facility-administered encounter medications on file as of 05/23/2022.     Review of Systems  Review of Systems  Constitutional: Negative.   HENT: Negative.    Cardiovascular: Negative.   Gastrointestinal: Negative.   Allergic/Immunologic: Negative.   Neurological: Negative.   Psychiatric/Behavioral: Negative.         Physical Exam  BP 108/78   Pulse 97   Temp 97.6 F (36.4 C)   Ht '5\' 3"'$  (1.6 m)   Wt 114 lb (51.7 kg)   SpO2 100%   BMI 20.19 kg/m   Wt Readings from Last 5 Encounters:  05/23/22 114 lb (51.7 kg)  04/03/22 112 lb 7 oz (51 kg)  02/20/22 112 lb 6.4 oz (51 kg)  02/04/22 118 lb 9.6 oz (53.8 kg)  12/12/21 120 lb 9.5 oz (54.7 kg)     Physical Exam Vitals and nursing note reviewed.  Constitutional:      General: She is not in acute distress.    Appearance: She is well-developed.  Cardiovascular:     Rate and Rhythm: Normal rate and regular rhythm.  Pulmonary:     Effort: Pulmonary effort is normal.     Breath sounds: Normal breath sounds.  Neurological:     Mental Status: She is alert and oriented to person, place, and time.      Lab Results:  CBC    Component Value Date/Time   WBC WILL FOLLOW 05/23/2022 1421   WBC 12.2 (H) 04/04/2022 0419   RBC WILL FOLLOW 05/23/2022 1421   RBC 2.16 (L) 04/04/2022 0419   HGB WILL FOLLOW 05/23/2022 1421   HCT WILL FOLLOW 05/23/2022 1421   PLT WILL FOLLOW 05/23/2022 1421   MCV WILL FOLLOW 05/23/2022 1421   MCH WILL FOLLOW 05/23/2022 1421   MCH 32.9 04/04/2022 0419   MCHC WILL FOLLOW 05/23/2022 1421   MCHC 36.6 (H) 04/04/2022 0419   RDW WILL FOLLOW 05/23/2022 1421   LYMPHSABS WILL FOLLOW 05/23/2022 1421   MONOABS 1.5 (H) 04/02/2022 1051   EOSABS WILL FOLLOW 05/23/2022 1421   BASOSABS WILL FOLLOW 05/23/2022 1421    BMET    Component Value Date/Time   NA 136 05/23/2022 1421   K 3.6 05/23/2022 1421   CL 103 05/23/2022 1421   CO2 20 05/23/2022 1421   GLUCOSE 118 (H) 05/23/2022 1421    GLUCOSE 112 (H) 04/04/2022 0419   BUN 9 05/23/2022 1421   CREATININE 0.61 05/23/2022 1421   CALCIUM 9.2 05/23/2022 1421   GFRNONAA >60 04/04/2022 0419   GFRAA 136 08/02/2020 1538    BNP    Component Value Date/Time   BNP 70.6 05/05/2019 0204     Assessment & Plan:   Sickle cell anemia with pain (HCC) - Sickle Cell Panel  2. Vitamin D deficiency  - Sickle Cell Panel  3. Lipid screening  - Lipid Panel  Follow up:  Follow up  in 3 months or sooner if needed     Fenton Foy, NP 05/24/2022

## 2022-05-23 NOTE — Progress Notes (Signed)
Patient's right chest PAC accessed using sterile technique. Labs drawn from port and given to [employer]. PAC flushed with 0.9% NS and 500 units Heparin. PAC de-accessed and band-aid placed over site. Patient tolerated well. Alert, oriented and ambulatory at discharge.

## 2022-05-24 ENCOUNTER — Encounter: Payer: Self-pay | Admitting: Nurse Practitioner

## 2022-05-24 NOTE — Assessment & Plan Note (Signed)
-   Sickle Cell Panel  2. Vitamin D deficiency  - Sickle Cell Panel  3. Lipid screening  - Lipid Panel  Follow up:  Follow up in 3 months or sooner if needed

## 2022-05-27 ENCOUNTER — Other Ambulatory Visit: Payer: Self-pay | Admitting: Nurse Practitioner

## 2022-05-28 ENCOUNTER — Other Ambulatory Visit: Payer: Self-pay

## 2022-05-28 ENCOUNTER — Other Ambulatory Visit: Payer: Self-pay | Admitting: Family Medicine

## 2022-05-28 DIAGNOSIS — D5700 Hb-SS disease with crisis, unspecified: Secondary | ICD-10-CM

## 2022-05-28 NOTE — Telephone Encounter (Signed)
From: [facility] To: Office of Fenton Foy, NP Sent: 05/27/2022 4:29 PM EST Subject: Medication Renewal Request  Refills have been requested for the following medications:   HYDROmorphone (DILAUDID) 4 MG tablet Molly Gutierrez]  [facility] [place] [place] - [address] Delivery method: [employer]

## 2022-05-28 NOTE — Telephone Encounter (Signed)
Hydromorphone

## 2022-05-29 ENCOUNTER — Other Ambulatory Visit: Payer: Self-pay | Admitting: Nurse Practitioner

## 2022-05-29 ENCOUNTER — Other Ambulatory Visit: Payer: Self-pay

## 2022-05-30 ENCOUNTER — Other Ambulatory Visit: Payer: Self-pay | Admitting: Nurse Practitioner

## 2022-05-30 DIAGNOSIS — D5700 Hb-SS disease with crisis, unspecified: Secondary | ICD-10-CM

## 2022-05-30 MED ORDER — HYDROMORPHONE HCL 4 MG PO TABS: 4.0000 mg | ORAL_TABLET | Freq: Four times a day (QID) | ORAL | 0 refills | Status: DC | PRN

## 2022-05-31 ENCOUNTER — Telehealth: Payer: Self-pay

## 2022-05-31 NOTE — Telephone Encounter (Signed)
Transition Care Management Follow-up Telephone Call Date of discharge and from where: 05/30/22 How have you been since you were released from the hospital? Per pt she is ok bu still in pain Any questions or concerns? No  Items Reviewed: Did the pt receive and understand the discharge instructions provided? Yes  Medications obtained and verified? Yes  Other? No  Any new allergies since your discharge? No  Dietary orders reviewed? No Do you have support at home? Yes    Follow up appointments reviewed:  PCP Hospital f/u appt confirmed? Yes  Scheduled to see Tonya on 06/14/22 @ 11:20am. [facility] f/u appt confirmed? No   Are transportation arrangements needed? No  If their condition worsens, is the pt aware to call PCP or go to the Emergency Dept.? Yes Was the patient provided with contact information for the PCP's office or ED? Yes Was to pt encouraged to call back with questions or concerns? Yes   Elyse Jarvis RMA

## 2022-06-03 LAB — CMP14+CBC/D/PLT+FER+RETIC+V...
ALT: 10 IU/L (ref 0–32)
AST: 32 IU/L (ref 0–40)
Albumin/Globulin Ratio: 1.5 (ref 1.2–2.2)
Albumin: 4.5 g/dL (ref 3.9–4.9)
Alkaline Phosphatase: 83 IU/L (ref 44–121)
BUN/Creatinine Ratio: 15 (ref 9–23)
BUN: 9 mg/dL (ref 6–24)
Basophils Absolute: 0.2 10*3/uL (ref 0.0–0.2)
Basos: 1 %
Bilirubin Total: 2.2 mg/dL — ABNORMAL HIGH (ref 0.0–1.2)
CO2: 20 mmol/L (ref 20–29)
Calcium: 9.2 mg/dL (ref 8.7–10.2)
Chloride: 103 mmol/L (ref 96–106)
Creatinine, Ser: 0.61 mg/dL (ref 0.57–1.00)
EOS (ABSOLUTE): 0.8 10*3/uL — ABNORMAL HIGH (ref 0.0–0.4)
Eos: 5 %
Ferritin: 1438 ng/mL — ABNORMAL HIGH (ref 15–150)
Globulin, Total: 3.1 g/dL (ref 1.5–4.5)
Glucose: 118 mg/dL — ABNORMAL HIGH (ref 70–99)
Hematocrit: 23 % — ABNORMAL LOW (ref 34.0–46.6)
Hemoglobin: 7.8 g/dL — ABNORMAL LOW (ref 11.1–15.9)
Immature Grans (Abs): 0.1 10*3/uL (ref 0.0–0.1)
Immature Granulocytes: 1 %
Lymphocytes Absolute: 3.3 10*3/uL — ABNORMAL HIGH (ref 0.7–3.1)
Lymphs: 21 %
MCH: 30.1 pg (ref 26.6–33.0)
MCHC: 33.9 g/dL (ref 31.5–35.7)
MCV: 89 fL (ref 79–97)
Monocytes Absolute: 1.9 10*3/uL — ABNORMAL HIGH (ref 0.1–0.9)
Monocytes: 12 %
NRBC: 1 % — ABNORMAL HIGH (ref 0–0)
Neutrophils Absolute: 9.9 10*3/uL — ABNORMAL HIGH (ref 1.4–7.0)
Neutrophils: 60 %
Platelets: 654 10*3/uL — ABNORMAL HIGH (ref 150–450)
Potassium: 3.6 mmol/L (ref 3.5–5.2)
RBC: 2.59 x10E6/uL — CL (ref 3.77–5.28)
RDW: 18.6 % — ABNORMAL HIGH (ref 11.7–15.4)
Retic Ct Pct: 8.1 % — ABNORMAL HIGH (ref 0.6–2.6)
Sodium: 136 mmol/L (ref 134–144)
Total Protein: 7.6 g/dL (ref 6.0–8.5)
Vit D, 25-Hydroxy: 17.4 ng/mL — ABNORMAL LOW (ref 30.0–100.0)
WBC: 16.1 10*3/uL — ABNORMAL HIGH (ref 3.4–10.8)
eGFR: 116 mL/min/{1.73_m2} (ref >59)

## 2022-06-03 LAB — DRUG SCREEN 13 W/CONF , SERUM
Amphetamines, IA: NEGATIVE ng/mL
Barbiturates, IA: NEGATIVE ug/mL
Benzodiazepines, IA: NEGATIVE ng/mL
Cocaine & Metabolite, IA: NEGATIVE ng/mL
FENTANYL, IA: NEGATIVE ng/mL
MEPERIDINE, IA: NEGATIVE ng/mL
Methadone, IA: NEGATIVE ng/mL
Opiates, IA: POSITIVE ng/mL — AB
Oxycodones, IA: NEGATIVE ng/mL
Phencyclidine, IA: NEGATIVE ng/mL
Propoxyphene, IA: NEGATIVE ng/mL
THC(Marijuana) Metabolite, IA: NEGATIVE ng/mL
TRAMADOL, IA: NEGATIVE ng/mL

## 2022-06-03 LAB — LIPID PANEL
Chol/HDL Ratio: 4.1 ratio (ref 0.0–4.4)
Cholesterol, Total: 128 mg/dL (ref 100–199)
HDL: 31 mg/dL — ABNORMAL LOW (ref >39)
LDL Chol Calc (NIH): 72 mg/dL (ref 0–99)
Triglycerides: 140 mg/dL (ref 0–149)
VLDL Cholesterol Cal: 25 mg/dL (ref 5–40)

## 2022-06-03 LAB — OPIATES,MS,WB/SP RFX
6-Acetylmorphine: NEGATIVE
Codeine: NEGATIVE ng/mL
Dihydrocodeine: NEGATIVE ng/mL
Hydrocodone: NEGATIVE ng/mL
Hydromorphone: 2.3 ng/mL
Morphine: NEGATIVE ng/mL
Opiate Confirmation: POSITIVE

## 2022-06-03 LAB — OXYCODONES,MS,WB/SP RFX
Oxycocone: NEGATIVE ng/mL
Oxycodones Confirmation: NEGATIVE
Oxymorphone: NEGATIVE ng/mL

## 2022-06-05 ENCOUNTER — Other Ambulatory Visit: Payer: Self-pay | Admitting: Nurse Practitioner

## 2022-06-13 ENCOUNTER — Other Ambulatory Visit: Payer: Self-pay | Admitting: Nurse Practitioner

## 2022-06-13 DIAGNOSIS — D5700 Hb-SS disease with crisis, unspecified: Secondary | ICD-10-CM

## 2022-06-13 MED ORDER — HYDROMORPHONE HCL 4 MG PO TABS: 4.0000 mg | ORAL_TABLET | Freq: Four times a day (QID) | ORAL | 0 refills | Status: DC | PRN

## 2022-06-14 ENCOUNTER — Inpatient Hospital Stay: Payer: Self-pay | Admitting: Nurse Practitioner

## 2022-06-18 ENCOUNTER — Telehealth: Payer: Self-pay | Admitting: Nurse Practitioner

## 2022-06-18 NOTE — Telephone Encounter (Signed)
Left message for patient to call back and schedule Medicare Annual Wellness Visit (AWV).   Please offer to do virtually or by telephone.   Last AWV:  03/12/2021   Schedule at any time with Vina   30 minute appointment for Virtual or phone  Any questions, please contact me at [phone]   Thank you,   [facility]  Ambulatory Clinical Support for Care Management [place] Are. We Are. [facility] ??[phone] or ??[phone]

## 2022-06-24 ENCOUNTER — Other Ambulatory Visit: Payer: Self-pay

## 2022-06-24 DIAGNOSIS — D5700 Hb-SS disease with crisis, unspecified: Secondary | ICD-10-CM

## 2022-06-24 NOTE — Telephone Encounter (Signed)
From: [facility] To: Office of Fenton Foy, NP Sent: 06/23/2022 9:59 PM EST Subject: Medication Renewal Request  Refills have been requested for the following medications:   HYDROmorphone (DILAUDID) 4 MG tablet Molly Gutierrez]  [facility]erred [facility] [place], [place] - [address] Delivery method: [employer]

## 2022-06-24 NOTE — Telephone Encounter (Signed)
Please advise Kh 

## 2022-06-26 ENCOUNTER — Other Ambulatory Visit: Payer: Self-pay | Admitting: Nurse Practitioner

## 2022-06-26 NOTE — Telephone Encounter (Signed)
Please advise KH 

## 2022-06-27 MED ORDER — HYDROMORPHONE HCL 4 MG PO TABS: 4.0000 mg | ORAL_TABLET | Freq: Four times a day (QID) | ORAL | 0 refills | Status: DC | PRN

## 2022-07-08 ENCOUNTER — Other Ambulatory Visit: Payer: Self-pay

## 2022-07-08 DIAGNOSIS — D5700 Hb-SS disease with crisis, unspecified: Secondary | ICD-10-CM

## 2022-07-08 NOTE — Telephone Encounter (Signed)
From: [facility] To: Office of Fenton Foy, NP Sent: 07/08/2022 6:41 AM EST Subject: Medication Renewal Request  Refills have been requested for the following medications:   HYDROmorphone (DILAUDID) 4 MG tablet Molly Gutierrez]  [phone] [place], [place] - [address] Delivery method: [employer]

## 2022-07-11 ENCOUNTER — Other Ambulatory Visit: Payer: Self-pay

## 2022-07-11 DIAGNOSIS — D5700 Hb-SS disease with crisis, unspecified: Secondary | ICD-10-CM

## 2022-07-11 MED ORDER — HYDROMORPHONE HCL 4 MG PO TABS: 4.0000 mg | ORAL_TABLET | Freq: Four times a day (QID) | ORAL | 0 refills | Status: DC | PRN

## 2022-07-11 NOTE — Telephone Encounter (Signed)
From: [facility] To: Office of Fenton Foy, NP Sent: 07/11/2022 8:31 AM EST Subject: Medication Renewal Request  Refills have been requested for the following medications:   HYDROmorphone (DILAUDID) 4 MG tablet Kenney Houseman S Nichols]  [facility] [phone] [facility] - [place], [place] - [address] Delivery method: [employer]

## 2022-07-12 ENCOUNTER — Telehealth: Payer: Self-pay

## 2022-07-12 NOTE — Telephone Encounter (Signed)
Pt is sickle cell pt and will have to follow up q three months. [place]

## 2022-07-18 ENCOUNTER — Ambulatory Visit: Payer: Medicare HMO

## 2022-07-18 VITALS — Ht 63.0 in | Wt 114.0 lb

## 2022-07-18 DIAGNOSIS — Z0000 Encounter for general adult medical examination without abnormal findings: Secondary | ICD-10-CM | POA: Diagnosis not present

## 2022-07-18 NOTE — Progress Notes (Signed)
Subjective:   Collette Pescador is a 42 y.o. female who presents for Medicare Annual (Subsequent) preventive examination.  Review of Systems    Virtual Visit via Telephone Note  I connected with  [facility] on 07/18/22 at  3:00 PM EST by telephone and verified that I am speaking with the correct person using two identifiers.  Location: Patient: Home Provider: Office Persons participating in the virtual visit: patient/Nurse Health Advisor   I discussed the limitations, risks, security and privacy concerns of performing an evaluation and management service by telephone and the availability of in person appointments. The patient expressed understanding and agreed to proceed.  Interactive audio and video telecommunications were attempted between this nurse and patient, however failed, due to patient having technical difficulties OR patient did not have access to video capability.  We continued and completed visit with audio only.  Some vital signs may be absent or patient reported.   Criselda Peaches, LPN  Cardiac Risk Factors include: advanced age (>20mn, >>59women)     Objective:    Today's Vitals   07/18/22 1619  Weight: 114 lb (51.7 kg)  Height: '5\' 3"'$  (1.6 m)   Body mass index is 20.19 kg/m.     07/18/2022    4:25 PM 04/02/2022    4:56 PM 02/04/2022    6:12 PM 12/12/2021    1:00 AM 12/11/2021    3:07 PM 11/12/2021    3:35 AM 11/11/2021   10:01 PM  Advanced Directives  Does Patient Have a Medical Advance Directive? No No No No No No No  Would patient like information on creating a medical advance directive? No - Patient declined No - Patient declined No - Patient declined No - Patient declined  No - Patient declined No - Patient declined    Current Medications (verified) Outpatient Encounter Medications as of 07/18/2022  Medication Sig   acetaminophen (TYLENOL) 500 MG tablet Take 500 mg by mouth every 6 (six) hours as needed for mild pain or headache (or  cramps).   albuterol (VENTOLIN HFA) 108 (90 Base) MCG/ACT inhaler Inhale 2 puffs into the lungs every 6 (six) hours as needed for wheezing or shortness of breath.   Cholecalciferol (VITAMIN D3 PO) Take 1 capsule by mouth at bedtime.   Cyanocobalamin (VITAMIN B-12 PO) Take 1 tablet by mouth at bedtime.   diphenhydrAMINE (BENADRYL) 25 MG tablet Take 25 mg by mouth every 4 (four) hours as needed for itching (with hydromorphone dose).   fluticasone (FLONASE) 50 MCG/ACT nasal spray Place 1 spray into both nostrils daily as needed for allergies or rhinitis.   FOLIC ACID PO Take 1 tablet by mouth at bedtime.   HYDROmorphone (DILAUDID) 4 MG tablet Take 1 tablet (4 mg total) by mouth every 6 (six) hours as needed (for pain).   mirtazapine (REMERON) 45 MG tablet Take 1 tablet (45 mg total) by mouth at bedtime.   mometasone-formoterol (DULERA) 100-5 MCG/ACT AERO Inhale 2 puffs into the lungs daily as needed for wheezing or shortness of breath.   naloxone (NARCAN) nasal spray 4 mg/0.1 mL Place 0.4 mg into the nose once as needed (opioid overdose).   polyethylene glycol (MIRALAX / GLYCOLAX) 17 g packet Take 17 g by mouth daily as needed for mild constipation (MIX AS DIRECTED AND DRINK).   promethazine (PHENERGAN) 25 MG tablet Take 0.5-1 tablets (12.5-25 mg total) by mouth every 6 (six) hours as needed for nausea or vomiting. (Patient taking differently: Take 25 mg by mouth  every 4 (four) hours as needed for nausea or vomiting (with hydromorphone dose).)   XARELTO 20 MG TABS tablet Take 1 tablet (20 mg total) by mouth daily with supper. (Patient taking differently: Take 20 mg by mouth at bedtime.)   No facility-administered encounter medications on file as of 07/18/2022.    Allergies (verified) Cefaclor, Hydroxyurea, Omeprazole, and Ketamine   History: Past Medical History:  Diagnosis Date   Asthma    Eczema    History of pulmonary embolus (PE)    Sickle cell anemia (HCC)    Past Surgical History:   Procedure Laterality Date   CHOLECYSTECTOMY     ERCP     JOINT REPLACEMENT     PORTA CATH INSERTION     TUBAL LIGATION     WISDOM TOOTH EXTRACTION     Family History  Problem Relation Age of Onset   Renal Disease Mother    Hypertension Mother    High Cholesterol Mother    Heart attack Mother    Diabetes Brother    Social History   Socioeconomic History   Marital status: Single    Spouse name: Not on file   Number of children: Not on file   Years of education: Not on file   Highest education level: Not on file  Occupational History   Not on file  Tobacco Use   Smoking status: Never   Smokeless tobacco: Never  Vaping Use   Vaping Use: Never used  Substance and Sexual Activity   Alcohol use: Never   Drug use: Never   Sexual activity: Not Currently  Other Topics Concern   Not on file  Social History Narrative   Not on file   Social Determinants of Health   Financial Resource Strain: Low Risk  (07/18/2022)   Overall Financial Resource Strain (CARDIA)    Difficulty of Paying Living Expenses: Not hard at all  Food Insecurity: No Food Insecurity (07/18/2022)   Hunger Vital Sign    Worried About Running Out of Food in the Last Year: Never true    [place] in the Last Year: Never true  Transportation Needs: No Transportation Needs (07/18/2022)   PRAPARE - Hydrologist (Medical): No    Lack of Transportation (Non-Medical): No  Physical Activity: Inactive (07/18/2022)   Exercise Vital Sign    Days of Exercise per Week: 0 days    Minutes of Exercise per Session: 0 min  Stress: No Stress Concern Present (07/18/2022)   [place]    Feeling of Stress : Not at all  Social Connections: Moderately Integrated (07/18/2022)   Social Connection and Isolation Panel [NHANES]    Frequency of Communication with Friends and Family: More than three times a week    Frequency of Social  Gatherings with Friends and Family: More than three times a week    Attends Religious Services: More than 4 times per year    Active Member of [employer] or Organizations: Yes    Attends Music therapist: More than 4 times per year    Marital Status: Never married    Tobacco Counseling Counseling given: Not Answered   Clinical Intake:  Pre-visit preparation completed: No  Pain : No/denies pain     BMI - recorded: 20.19 Nutritional Status: BMI of 19-24  Normal Nutritional Risks: None Diabetes: No  How often do you need to have someone help you when you read  instructions, pamphlets, or other written materials from your doctor or pharmacy?: 1 - Never  Diabetic?  No  Interpreter Needed?: No  Information entered by :: Rolene Arbour LPN   Activities of Daily Living    07/18/2022    4:23 PM 04/02/2022    5:07 PM  In your present state of health, do you have any difficulty performing the following activities:  Hearing? 0   Vision? 0   Difficulty concentrating or making decisions? 0   Walking or climbing stairs? 0   Dressing or bathing? 0   Doing errands, shopping? 0 0  Preparing Food and eating ? N   Using the Toilet? N   In the past six months, have you accidently leaked urine? N   Do you have problems with loss of bowel control? N   Managing your Medications? N   Managing your Finances? N   Housekeeping or managing your Housekeeping? N     Patient Care Team: Fenton Foy, NP as PCP - General (Pulmonary Disease)  Indicate any recent Medical Services you may have received from other than [facility] providers in the past year (date may be approximate).     Assessment:   This is a routine wellness examination for Jalexa.  Hearing/Vision screen Hearing Screening - Comments:: Denies hearing difficulties   Vision Screening - Comments:: Wears rx glasses - up to date with routine eye exams with  Deferred  Dietary issues and exercise activities  discussed: Exercise limited by: None identified   Goals Addressed               This Visit's Progress     Patent stated (pt-stated)        Buy me house       Depression Screen    07/18/2022    4:23 PM 10/29/2021    2:25 PM 04/04/2021    2:15 PM 03/12/2021    2:11 PM 05/24/2020    1:54 PM 04/13/2020    1:46 PM 07/09/2019    2:22 PM  PHQ 2/9 Scores  PHQ - 2 Score 0 0 0 0 0 0 0  PHQ- 9 Score  3         Fall Risk    07/18/2022    4:24 PM 02/20/2022    1:58 PM 10/29/2021    2:24 PM 04/04/2021    2:15 PM 03/12/2021    1:58 PM  Fall Risk   Falls in the past year? 0 0 0 0 1  Number falls in past yr: 0 0 0 0 0  Injury with Fall? 0 0 0 0 1  Risk for fall due to : No Fall Risks No Fall Risks No Fall Risks  History of fall(s)  Follow up Falls prevention discussed Falls evaluation completed Falls evaluation completed  Falls evaluation completed;Falls prevention discussed    FALL RISK PREVENTION PERTAINING TO THE HOME:  Any stairs in or around the home? No  If so, are there any without handrails? No  Home free of loose throw rugs in walkways, pet beds, electrical cords, etc? Yes  Adequate lighting in your home to reduce risk of falls? Yes   ASSISTIVE DEVICES UTILIZED TO PREVENT FALLS:  Life alert? No  Use of a cane, walker or w/c? No  Grab bars in the bathroom? No  Shower chair or bench in shower? No  Elevated toilet seat or a handicapped toilet? No   TIMED UP AND GO:  Was the test performed? No .  Audio Visit   Cognitive Function:        07/18/2022    4:25 PM 03/12/2021    2:02 PM  6CIT Screen  What Year? 0 points 0 points  What month? 0 points 0 points  What time? 0 points 0 points  Count back from 20 0 points 0 points  Months in reverse 0 points 0 points  Repeat phrase 0 points 0 points  Total Score 0 points 0 points    Immunizations Immunization History  Administered Date(s) Administered   PFIZER Comirnaty(Gray Top)Covid-19 Tri-Sucrose Vaccine 09/25/2019,  10/16/2019   PFIZER(Purple Top)SARS-COV-2 Vaccination 09/25/2019, 10/16/2019   Pfizer Covid-19 Vaccine Bivalent Booster 42yr & up 09/25/2019, 10/16/2019   Tdap 07/09/2019      Flu Vaccine status: Up to date    Covid-19 vaccine status: Completed vaccines  Qualifies for Shingles Vaccine? No   Zostavax completed No   Shingrix Completed?: No.    Education has been provided regarding the importance of this vaccine. Patient has been advised to call insurance company to determine out of pocket expense if they have not yet received this vaccine. Advised may also receive vaccine at local pharmacy or Health Dept. Verbalized acceptance and understanding.  Screening Tests Health Maintenance  Topic Date Due   PAP SMEAR-Modifier  06/17/2021   COVID-19 Vaccine (8 - 2023-24 season) 08/03/2022 (Originally 02/15/2022)   INFLUENZA VACCINE  09/15/2022 (Originally 01/15/2022)   Hepatitis C Screening  07/19/2023 (Originally 06/03/1999)   Medicare Annual Wellness (AWV)  07/19/2023   DTaP/Tdap/Td (2 - Td or Tdap) 07/08/2029   HIV Screening  Completed   HPV VACCINES  Aged Out    Health Maintenance  Health Maintenance Due  Topic Date Due   PAP SMEAR-Modifier  06/17/2021          Lung Cancer Screening: (Low Dose CT Chest recommended if Age 42-80years, 30 pack-year currently smoking OR have quit w/in 15years.) does not qualify.   Additional Screening:  Hepatitis C Screening: does qualify; Deferred  Vision Screening: Recommended annual ophthalmology exams for early detection of glaucoma and other disorders of the eye. Is the patient up to date with their annual eye exam?  Yes  Who is the provider or what is the name of the office in which the patient attends annual eye exams? Deferred If pt is not established with a provider, would they like to be referred to a provider to establish care? No .   Dental Screening: Recommended annual dental exams for proper oral hygiene  Community Resource  Referral / Chronic Care Management:  CRR required this visit?  No   CCM required this visit?  No      Plan:     I have personally reviewed and noted the following in the patient's chart:   Medical and social history Use of alcohol, tobacco or illicit drugs  Current medications and supplements including opioid prescriptions. Patient is not currently taking opioid prescriptions. Functional ability and status Nutritional status Physical activity Advanced directives List of other physicians Hospitalizations, surgeries, and ER visits in previous 12 months Vitals Screenings to include cognitive, depression, and falls Referrals and appointments  In addition, I have reviewed and discussed with patient certain preventive protocols, quality metrics, and best practice recommendations. A written personalized care plan for preventive services as well as general preventive health recommendations were provided to patient.     BCriselda Peaches LPN   21/09/4313  Nurse Notes: Patient due Hep=C Screening

## 2022-07-18 NOTE — Patient Instructions (Addendum)
Molly Gutierrez , Thank you for taking time to come for your Medicare Wellness Visit. I appreciate your ongoing commitment to your health goals. Please review the following plan we discussed and let me know if I can assist you in the future.   These are the goals we discussed:  Goals       Patent stated (pt-stated)      Buy me house        This is a list of the screening recommended for you and due dates:  Health Maintenance  Topic Date Due   Pap Smear  06/17/2021   COVID-19 Vaccine (8 - 2023-24 season) 08/03/2022*   Flu Shot  09/15/2022*   Hepatitis C Screening: USPSTF Recommendation to screen - Ages 18-79 yo.  07/19/2023*   Medicare Annual Wellness Visit  07/19/2023   DTaP/Tdap/Td vaccine (2 - Td or Tdap) 07/08/2029   HIV Screening  Completed   HPV Vaccine  Aged Out  *Topic was postponed. The date shown is not the original due date.    Advanced directives: Advance directive discussed with you today. Even though you declined this today, please call our office should you change your mind, and we can give you the proper paperwork for you to fill out.   Conditions/risks identified: None  Next appointment: Follow up in one year for your annual wellness visit.    Preventive Care 40-64 Years, Female Preventive care refers to lifestyle choices and visits with your health care provider that can promote health and wellness. What does preventive care include? A yearly physical exam. This is also called an annual well check. Dental exams once or twice a year. Routine eye exams. Ask your health care provider how often you should have your eyes checked. Personal lifestyle choices, including: Daily care of your teeth and gums. Regular physical activity. Eating a healthy diet. Avoiding tobacco and drug use. Limiting alcohol use. Practicing safe sex. Taking low-dose aspirin daily starting at age 29. Taking vitamin and mineral supplements as recommended by your health care  provider. What happens during an annual well check? The services and screenings done by your health care provider during your annual well check will depend on your age, overall health, lifestyle risk factors, and family history of disease. Counseling  Your health care provider may ask you questions about your: Alcohol use. Tobacco use. Drug use. Emotional well-being. Home and relationship well-being. Sexual activity. Eating habits. Work and work Statistician. Method of birth control. Menstrual cycle. Pregnancy history. Screening  You may have the following tests or measurements: Height, weight, and BMI. Blood pressure. Lipid and cholesterol levels. These may be checked every 5 years, or more frequently if you are over 73 years old. Skin check. Lung cancer screening. You may have this screening every year starting at age 21 if you have a 30-pack-year history of smoking and currently smoke or have quit within the past 15 years. Fecal occult blood test (FOBT) of the stool. You may have this test every year starting at age 32. Flexible sigmoidoscopy or colonoscopy. You may have a sigmoidoscopy every 5 years or a colonoscopy every 10 years starting at age 69. Hepatitis C blood test. Hepatitis B blood test. Sexually transmitted disease (STD) testing. Diabetes screening. This is done by checking your blood sugar (glucose) after you have not eaten for a while (fasting). You may have this done every 1-3 years. Mammogram. This may be done every 1-2 years. Talk to your health care provider about when you should  start having regular mammograms. This may depend on whether you have a family history of breast cancer. BRCA-related cancer screening. This may be done if you have a family history of breast, ovarian, tubal, or peritoneal cancers. Pelvic exam and Pap test. This may be done every 3 years starting at age 27. Starting at age 110, this may be done every 5 years if you have a Pap test in  combination with an HPV test. Bone density scan. This is done to screen for osteoporosis. You may have this scan if you are at high risk for osteoporosis. Discuss your test results, treatment options, and if necessary, the need for more tests with your health care provider. Vaccines  Your health care provider may recommend certain vaccines, such as: Influenza vaccine. This is recommended every year. Tetanus, diphtheria, and acellular pertussis (Tdap, Td) vaccine. You may need a Td booster every 10 years. Zoster vaccine. You may need this after age 37. Pneumococcal 13-valent conjugate (PCV13) vaccine. You may need this if you have certain conditions and were not previously vaccinated. Pneumococcal polysaccharide (PPSV23) vaccine. You may need one or two doses if you smoke cigarettes or if you have certain conditions. Talk to your health care provider about which screenings and vaccines you need and how often you need them. This information is not intended to replace advice given to you by your health care provider. Make sure you discuss any questions you have with your health care provider. Document Released: 06/30/2015 Document Revised: 02/21/2016 Document Reviewed: 04/04/2015 Elsevier Interactive Patient Education  2017 [place] Prevention in the Home Falls can cause injuries. They can happen to people of all ages. There are many things you can do to make your home safe and to help prevent falls. What can I do on the outside of my home? Regularly fix the edges of walkways and driveways and fix any cracks. Remove anything that might make you trip as you walk through a door, such as a raised step or threshold. Trim any bushes or trees on the path to your home. Use bright outdoor lighting. Clear any walking paths of anything that might make someone trip, such as rocks or tools. Regularly check to see if handrails are loose or broken. Make sure that both sides of any steps have  handrails. Any raised decks and porches should have guardrails on the edges. Have any leaves, snow, or ice cleared regularly. Use sand or salt on walking paths during winter. Clean up any spills in your garage right away. This includes oil or grease spills. What can I do in the bathroom? Use night lights. Install grab bars by the toilet and in the tub and shower. Do not use towel bars as grab bars. Use non-skid mats or decals in the tub or shower. If you need to sit down in the shower, use a plastic, non-slip stool. Keep the floor dry. Clean up any water that spills on the floor as soon as it happens. Remove soap buildup in the tub or shower regularly. Attach bath mats securely with double-sided non-slip rug tape. Do not have throw rugs and other things on the floor that can make you trip. What can I do in the bedroom? Use night lights. Make sure that you have a light by your bed that is easy to reach. Do not use any sheets or blankets that are too big for your bed. They should not hang down onto the floor. Have a firm chair  that has side arms. You can use this for support while you get dressed. Do not have throw rugs and other things on the floor that can make you trip. What can I do in the kitchen? Clean up any spills right away. Avoid walking on wet floors. Keep items that you use a lot in easy-to-reach places. If you need to reach something above you, use a strong step stool that has a grab bar. Keep electrical cords out of the way. Do not use floor polish or wax that makes floors slippery. If you must use wax, use non-skid floor wax. Do not have throw rugs and other things on the floor that can make you trip. What can I do with my stairs? Do not leave any items on the stairs. Make sure that there are handrails on both sides of the stairs and use them. Fix handrails that are broken or loose. Make sure that handrails are as long as the stairways. Check any carpeting to make sure  that it is firmly attached to the stairs. Fix any carpet that is loose or worn. Avoid having throw rugs at the top or bottom of the stairs. If you do have throw rugs, attach them to the floor with carpet tape. Make sure that you have a light switch at the top of the stairs and the bottom of the stairs. If you do not have them, ask someone to add them for you. What else can I do to help prevent falls? Wear shoes that: Do not have high heels. Have rubber bottoms. Are comfortable and fit you well. Are closed at the toe. Do not wear sandals. If you use a stepladder: Make sure that it is fully opened. Do not climb a closed stepladder. Make sure that both sides of the stepladder are locked into place. Ask someone to hold it for you, if possible. Clearly mark and make sure that you can see: Any grab bars or handrails. First and last steps. Where the edge of each step is. Use tools that help you move around (mobility aids) if they are needed. These include: Canes. Walkers. Scooters. Crutches. Turn on the lights when you go into a dark area. Replace any light bulbs as soon as they burn out. Set up your furniture so you have a clear path. Avoid moving your furniture around. If any of your floors are uneven, fix them. If there are any pets around you, be aware of where they are. Review your medicines with your doctor. Some medicines can make you feel dizzy. This can increase your chance of falling. Ask your doctor what other things that you can do to help prevent falls. This information is not intended to replace advice given to you by your health care provider. Make sure you discuss any questions you have with your health care provider. Document Released: 03/30/2009 Document Revised: 11/09/2015 Document Reviewed: 07/08/2014 Elsevier Interactive Patient Education  2017 [employer].

## 2022-07-23 ENCOUNTER — Other Ambulatory Visit: Payer: Self-pay

## 2022-07-23 DIAGNOSIS — D5700 Hb-SS disease with crisis, unspecified: Secondary | ICD-10-CM

## 2022-07-23 NOTE — Telephone Encounter (Signed)
From: [facility] To: Office of Fenton Foy, NP Sent: 07/22/2022 9:25 PM EST Subject: Medication Renewal Request  Refills have been requested for the following medications:   HYDROmorphone (DILAUDID) 4 MG tablet Molly Gutierrez]  Preferred pharmacy: [facility] [place], [place] - [address] Delivery method: [employer]

## 2022-07-23 NOTE — Telephone Encounter (Signed)
Caller & Relationship to patient:  MRN #  [id number]   Call Back Number:   Date of Last Office Visit: 07/18/2022     Date of Next Office Visit: 08/22/2022    Medication(s) to be Refilled: Hydromorphone  [facility]  ** Please notify patient to allow 48-72 hours to process** **Let patient know to contact pharmacy at the end of the day to make sure medication is ready. ** **If patient has not been seen in a year or longer, book an appointment **Advise to use MyChart for refill requests OR to contact their pharmacy

## 2022-07-24 ENCOUNTER — Telehealth: Payer: Self-pay

## 2022-07-26 ENCOUNTER — Other Ambulatory Visit: Payer: Self-pay | Admitting: Nurse Practitioner

## 2022-07-26 ENCOUNTER — Other Ambulatory Visit: Payer: Self-pay | Admitting: Family Medicine

## 2022-07-26 ENCOUNTER — Other Ambulatory Visit: Payer: Self-pay

## 2022-07-26 ENCOUNTER — Telehealth: Payer: Self-pay | Admitting: Nurse Practitioner

## 2022-07-26 DIAGNOSIS — D5700 Hb-SS disease with crisis, unspecified: Secondary | ICD-10-CM

## 2022-07-26 MED ORDER — HYDROMORPHONE HCL 4 MG PO TABS: 4.0000 mg | ORAL_TABLET | Freq: Four times a day (QID) | ORAL | 0 refills | Status: DC | PRN

## 2022-07-26 NOTE — Telephone Encounter (Signed)
From: [facility] To: Office of Fenton Foy, NP Sent: 07/25/2022 1:20 PM EST Subject: Medication Renewal Request  Refills have been requested for the following medications:   HYDROmorphone (DILAUDID) 4 MG tablet Kenney Houseman S Nichols]  Preferred pharmacy: [facility] [place], [place] - [address] Delivery method: [employer]

## 2022-07-26 NOTE — Telephone Encounter (Signed)
Caller & Relationship to patient: pt  MRN #  [id number]   Call Back Number:   Date of Last Office Visit: 07/24/2022     Date of Next Office Visit: 08/22/2022    Medication(s) to be Refilled: Hydromorphone   [facility] [address]   ** Please notify patient to allow 48-72 hours to process** **Let patient know to contact pharmacy at the end of the day to make sure medication is ready. ** **If patient has not been seen in a year or longer, book an appointment **Advise to use MyChart for refill requests OR to contact their pharmacy     PT Molly Gutierrez

## 2022-07-26 NOTE — Telephone Encounter (Signed)
Caller & Relationship to patient:  MRN #  [id number]   Call Back Number:   Date of Last Office Visit: 07/24/2022     Date of Next Office Visit: 07/26/2022    Medication(s) to be Refilled: Hydromorphone   [facility]  [address] [place]  ** Please notify patient to allow 48-72 hours to process** **Let patient know to contact pharmacy at the end of the day to make sure medication is ready. ** **If patient has not been seen in a year or longer, book an appointment **Advise to use MyChart for refill requests OR to contact their pharmacy

## 2022-07-26 NOTE — Progress Notes (Signed)
Reviewed PDMP substance reporting system prior to prescribing opiate medications. No inconsistencies noted.   Meds ordered this encounter  Medications   HYDROmorphone (DILAUDID) 4 MG tablet    Sig: Take 1 tablet (4 mg total) by mouth every 6 (six) hours as needed (for pain).    Dispense:  60 tablet    Refill:  0    Order Specific Question:   Supervising Provider    Answer:   [name] [id number]    [name]  APRN, MSN, FNP-C [facility] [place] [address] [place], [place] [address] [phone]

## 2022-08-08 ENCOUNTER — Other Ambulatory Visit: Payer: Self-pay

## 2022-08-08 DIAGNOSIS — D5700 Hb-SS disease with crisis, unspecified: Secondary | ICD-10-CM

## 2022-08-08 NOTE — Telephone Encounter (Signed)
From: [facility] To: Office of Cammie Sickle, [place] Sent: 08/08/2022 2:53 AM EST Subject: Medication Renewal Request  Refills have been requested for the following medications:   HYDROmorphone (DILAUDID) 4 MG tablet [Molly Gutierrez]  [id number] [place] [place] - [address] Delivery method: [employer]

## 2022-08-09 MED ORDER — HYDROMORPHONE HCL 4 MG PO TABS: 4.0000 mg | ORAL_TABLET | Freq: Four times a day (QID) | ORAL | 0 refills | Status: DC | PRN

## 2022-08-12 NOTE — Telephone Encounter (Signed)
Error

## 2022-08-19 ENCOUNTER — Telehealth: Payer: Self-pay

## 2022-08-19 NOTE — Transitions of Care (Post Inpatient/ED Visit) (Signed)
   08/19/2022  Name: Molly Gutierrez MRN: [id number] DOB: [birth year]  Today's TOC FU Call Status: Today's TOC FU Call Status:: Successful TOC FU Call Competed TOC FU Call Complete Date: 08/19/22  Transition Care Management Follow-up Telephone Call Date of Discharge: 08/15/22 Discharge Facility: Other ([place]) Name of Other ([facility]) Discharge Facility: [facility] Type of Discharge: Inpatient Admission Primary Inpatient Discharge Diagnosis:: sickle cell pain crisis How have you been since you were released from the hospital?: Better Any questions or concerns?: No  Items Reviewed: Did you receive and understand the discharge instructions provided?: Yes Medications obtained and verified?: Yes (Medications Reviewed) Any new allergies since your discharge?: No Dietary orders reviewed?: Yes Do you have support at home?: Yes People in Home: significant other  Home Care and Equipment/Supplies: [place] Ordered?: No Any new equipment or medical supplies ordered?: No  Functional Questionnaire: Do you need assistance with bathing/showering or dressing?: No Do you need assistance with meal preparation?: No Do you need assistance with eating?: No Do you have difficulty maintaining continence: No Do you need assistance with getting out of bed/getting out of a chair/moving?: No Do you have difficulty managing or taking your medications?: No  Folllow up appointments reviewed: PCP Follow-up appointment confirmed?: Yes Date of PCP follow-up appointment?: 08/22/22 Follow-up Provider: Lazaro Arms NP [facility] Follow-up appointment confirmed?: NA Do you need transportation to your follow-up appointment?: No Do you understand care options if your condition(s) worsen?: Yes-patient verbalized understanding    [place] LPN [place] Direct Dial [phone]

## 2022-08-22 ENCOUNTER — Ambulatory Visit: Payer: Self-pay | Admitting: Nurse Practitioner

## 2022-08-26 ENCOUNTER — Other Ambulatory Visit: Payer: Self-pay | Admitting: Nurse Practitioner

## 2022-08-26 DIAGNOSIS — D5700 Hb-SS disease with crisis, unspecified: Secondary | ICD-10-CM

## 2022-08-26 MED ORDER — HYDROMORPHONE HCL 4 MG PO TABS: 4.0000 mg | ORAL_TABLET | Freq: Four times a day (QID) | ORAL | 0 refills | Status: DC | PRN

## 2022-09-09 ENCOUNTER — Other Ambulatory Visit: Payer: Self-pay

## 2022-09-09 DIAGNOSIS — D5700 Hb-SS disease with crisis, unspecified: Secondary | ICD-10-CM

## 2022-09-09 MED ORDER — HYDROMORPHONE HCL 4 MG PO TABS: 4.0000 mg | ORAL_TABLET | Freq: Four times a day (QID) | ORAL | 0 refills | Status: DC | PRN

## 2022-09-09 NOTE — Telephone Encounter (Signed)
Please advise KH 

## 2022-09-09 NOTE — Telephone Encounter (Signed)
From: [facility] To: Office of Fenton Foy, NP Sent: 09/06/2022 11:28 PM EDT Subject: Medication Renewal Request  Refills have been requested for the following medications:   HYDROmorphone (DILAUDID) 4 MG tablet Kenney Houseman S [place]]  [facility] [place] [place] - [address] Delivery method: [employer]

## 2022-09-10 ENCOUNTER — Telehealth: Payer: Self-pay

## 2022-09-10 NOTE — Transitions of Care (Post Inpatient/ED Visit) (Signed)
   09/10/2022  Name: Molly Gutierrez MRN: [id number] DOB: Oct 21, 1980  Pt is excepted from [facility] call due to having Sickle cell disorder.  Elyse Jarvis RMA

## 2022-09-11 ENCOUNTER — Other Ambulatory Visit: Payer: Self-pay | Admitting: Nurse Practitioner

## 2022-09-11 ENCOUNTER — Telehealth: Payer: Self-pay

## 2022-09-11 DIAGNOSIS — D5700 Hb-SS disease with crisis, unspecified: Secondary | ICD-10-CM

## 2022-09-11 MED ORDER — HYDROMORPHONE HCL 4 MG PO TABS: 4.0000 mg | ORAL_TABLET | Freq: Four times a day (QID) | ORAL | 0 refills | Status: DC | PRN

## 2022-09-11 NOTE — Telephone Encounter (Signed)
Patient called in wanting pharmacy changed for [id number] [place], [place] currently pharmacy will not have rx until 4/9. Stated she took last one last night she is starting to hurt with cold and rainy weather.

## 2022-09-11 NOTE — Telephone Encounter (Signed)
Patient called again stating she really needs her medication sent in.

## 2022-09-23 ENCOUNTER — Other Ambulatory Visit: Payer: Self-pay

## 2022-09-23 DIAGNOSIS — D5700 Hb-SS disease with crisis, unspecified: Secondary | ICD-10-CM

## 2022-09-23 MED ORDER — HYDROMORPHONE HCL 4 MG PO TABS: 4.0000 mg | ORAL_TABLET | Freq: Four times a day (QID) | ORAL | 0 refills | Status: DC | PRN

## 2022-09-23 NOTE — Telephone Encounter (Signed)
From: [facility] To: Office of [name], NP Sent: 09/21/2022 7:37 PM EDT Subject: Medication Renewal Request  Refills have been requested for the following medications:   HYDROmorphone (DILAUDID) 4 MG tablet Molly Gutierrez]  [id number] [place], [place] - [address] Delivery method: [employer]

## 2022-09-23 NOTE — Telephone Encounter (Signed)
Please advise Kh 

## 2022-10-02 ENCOUNTER — Telehealth: Payer: Self-pay

## 2022-10-02 NOTE — Transitions of Care (Post Inpatient/ED Visit) (Signed)
   10/02/2022  Name: Molly Gutierrez MRN: [id number] DOB: 11/22/1980  Today's TOC FU Call Status: Today's TOC FU Call Status:: Successful TOC FU Call Competed TOC FU Call Complete Date: 10/02/22  Transition Care Management Follow-up Telephone Call Date of Discharge: 09/30/22 Discharge Facility: Other ([facility]) Name of Other ([facility]: [facility] Type of Discharge: Inpatient Admission Primary Inpatient Discharge Diagnosis:: HbSS crisis How have you been since you were released from the hospital?: Better Any questions or concerns?: No  Items Reviewed: Did you receive and understand the discharge instructions provided?: Yes Medications obtained and verified?: Yes (Medications Reviewed) Any new allergies since your discharge?: No Dietary orders reviewed?: Yes Do you have support at home?: Yes People in Home: parent(s)  Home Care and Equipment/Supplies: Were Home Health Services Ordered?: NA Any new equipment or medical supplies ordered?: NA  Functional Questionnaire: Do you need assistance with bathing/showering or dressing?: No Do you need assistance with meal preparation?: No Do you need assistance with eating?: No Do you have difficulty maintaining continence: No Do you need assistance with getting out of bed/getting out of a chair/moving?: No Do you have difficulty managing or taking your medications?: No  Follow up appointments reviewed: PCP Follow-up appointment confirmed?: Yes Date of PCP follow-up appointment?: 10/16/22 Follow-up Provider: Archie Patten [facility] Follow-up appointment confirmed?: NA Do you need transportation to your follow-up appointment?: No Do you understand care options if your condition(s) worsen?: Yes-patient verbalized understanding    SIGNATURE Karena Addison, LPN [facility] Nurse Health Advisor Direct Dial [phone]

## 2022-10-11 ENCOUNTER — Other Ambulatory Visit: Payer: Self-pay

## 2022-10-11 DIAGNOSIS — D5700 Hb-SS disease with crisis, unspecified: Secondary | ICD-10-CM

## 2022-10-11 NOTE — Telephone Encounter (Signed)
Please advise KH 

## 2022-10-11 NOTE — Telephone Encounter (Signed)
From: [facility] To: Office of [name], NP Sent: 10/10/2022 8:02 PM EDT Subject: Medication Renewal Request  Refills have been requested for the following medications:   HYDROmorphone (DILAUDID) 4 MG tablet Archie Patten S [place]]  [id number] [place], [place] - [address] Delivery method: [employer]

## 2022-10-14 ENCOUNTER — Other Ambulatory Visit: Payer: Self-pay

## 2022-10-14 DIAGNOSIS — D5700 Hb-SS disease with crisis, unspecified: Secondary | ICD-10-CM

## 2022-10-14 MED ORDER — HYDROMORPHONE HCL 4 MG PO TABS
4.0000 mg | ORAL_TABLET | Freq: Four times a day (QID) | ORAL | 0 refills | Status: DC | PRN
Start: 2022-10-14 — End: 2022-10-16

## 2022-10-14 NOTE — Telephone Encounter (Signed)
From: [facility] To: Office of [name], NP Sent: 10/11/2022 7:26 PM EDT Subject: Medication Renewal Request  Refills have been requested for the following medications:   HYDROmorphone (DILAUDID) 4 MG tablet Molly Gutierrez]  [id number] [place], [place] - [address] Delivery method: [employer]

## 2022-10-14 NOTE — Telephone Encounter (Signed)
Please advise KH 

## 2022-10-16 ENCOUNTER — Encounter: Payer: Self-pay | Admitting: Nurse Practitioner

## 2022-10-16 ENCOUNTER — Other Ambulatory Visit (HOSPITAL_COMMUNITY): Payer: Self-pay

## 2022-10-16 ENCOUNTER — Ambulatory Visit (INDEPENDENT_AMBULATORY_CARE_PROVIDER_SITE_OTHER): Payer: Medicare HMO | Admitting: Nurse Practitioner

## 2022-10-16 VITALS — BP 89/63 | HR 87 | Temp 97.9°F | Ht 63.0 in | Wt 108.4 lb

## 2022-10-16 DIAGNOSIS — D5700 Hb-SS disease with crisis, unspecified: Secondary | ICD-10-CM

## 2022-10-16 DIAGNOSIS — G894 Chronic pain syndrome: Secondary | ICD-10-CM

## 2022-10-16 MED ORDER — HYDROMORPHONE HCL 4 MG PO TABS
4.0000 mg | ORAL_TABLET | Freq: Four times a day (QID) | ORAL | 0 refills | Status: DC | PRN
Start: 2022-10-16 — End: 2022-10-16

## 2022-10-16 MED ORDER — HYDROMORPHONE HCL 4 MG PO TABS
4.0000 mg | ORAL_TABLET | Freq: Four times a day (QID) | ORAL | 0 refills | Status: DC | PRN
Start: 2022-10-16 — End: 2022-10-29
  Filled 2022-10-16: qty 60, 15d supply, fill #0

## 2022-10-16 NOTE — Assessment & Plan Note (Signed)
-   HYDROmorphone (DILAUDID) 4 MG tablet; Take 1 tablet (4 mg total) by mouth every 6 (six) hours as needed (for pain).  Dispense: 60 tablet; Refill: 0  2. Chronic pain syndrome  - 161096 11+Oxyco+Alc+Crt-Bund  Follow up:  Follow up in 1 month

## 2022-10-16 NOTE — Progress Notes (Signed)
@Patient  ID: Molly Gutierrez, female    DOB: January 07, 1981, 42 y.o.   MRN: [id number]  Chief Complaint  Patient presents with   Hospitalization Follow-up    Referring provider: [name], NP   HPI  [facility] presents for follow up  She  has a past medical history of Asthma, Eczema, History of pulmonary embolus (PE), and Sickle cell anemia (HCC).    Patient presents today for sickle cell follow-up. Patient was recently in the hospital for sickle cell crisis.  Patient states that she is still currently having crisis.  We discussed that she can return in the morning for evaluation in triage through the day hospital.  She states that she was not able to get her pain medicine that was sent in earlier in the week because the pharmacy was out of stock.  We will cancel this order and resend it to another pharmacy.  We discussed that patient will need to follow-up on a monthly basis going forward due to frequent use of pain medication for pain crisis with sickle cell.  Denies f/c/s, n/v/d, hemoptysis, PND, leg swelling Denies chest pain or edema      Allergies  Allergen Reactions   Cefaclor Hives and Swelling   Hydroxyurea Palpitations and Other (See Comments)    Lowers "blood levels" and heart rate (causes HYPOtension); "it messes me up, it drops my levels and stuff"    Omeprazole Other (See Comments)    Causes "sharp pains in the stomach"   Ketamine Palpitations and Other (See Comments)    "Pt states she has had previous reaction to ketamine. States she becomes flushed, heart races, dizzy, and feels like she is going to pass out."    Immunization History  Administered Date(s) Administered   PFIZER Comirnaty(Gray Top)Covid-19 Tri-Sucrose Vaccine 09/25/2019, 10/16/2019   PFIZER(Purple Top)SARS-COV-2 Vaccination 09/25/2019, 10/16/2019   Pfizer Covid-19 Vaccine Bivalent Booster 34yrs & up 09/25/2019, 10/16/2019   Tdap 07/09/2019    Past Medical History:   Diagnosis Date   Asthma    Eczema    History of pulmonary embolus (PE)    Sickle cell anemia (HCC)     Tobacco History: Social History   Tobacco Use  Smoking Status Never  Smokeless Tobacco Never   Counseling given: Not Answered   Outpatient Encounter Medications as of 10/16/2022  Medication Sig   acetaminophen (TYLENOL) 500 MG tablet Take 500 mg by mouth every 6 (six) hours as needed for mild pain or headache (or cramps).   albuterol (VENTOLIN HFA) 108 (90 Base) MCG/ACT inhaler Inhale 2 puffs into the lungs every 6 (six) hours as needed for wheezing or shortness of breath.   Cholecalciferol (VITAMIN D3 PO) Take 1 capsule by mouth at bedtime.   Cyanocobalamin (VITAMIN B-12 PO) Take 1 tablet by mouth at bedtime.   diphenhydrAMINE (BENADRYL) 25 MG tablet Take 25 mg by mouth every 4 (four) hours as needed for itching (with hydromorphone dose).   FOLIC ACID PO Take 1 tablet by mouth at bedtime.   mirtazapine (REMERON) 45 MG tablet Take 1 tablet (45 mg total) by mouth at bedtime.   mometasone-formoterol (DULERA) 100-5 MCG/ACT AERO Inhale 2 puffs into the lungs daily as needed for wheezing or shortness of breath.   naloxone (NARCAN) nasal spray 4 mg/0.1 mL Place 0.4 mg into the nose once as needed (opioid overdose).   OXBRYTA 500 MG TABS tablet Take by mouth.   polyethylene glycol (MIRALAX / GLYCOLAX) 17 g packet Take 17 g by  mouth daily as needed for mild constipation (MIX AS DIRECTED AND DRINK).   promethazine (PHENERGAN) 25 MG tablet Take 0.5-1 tablets (12.5-25 mg total) by mouth every 6 (six) hours as needed for nausea or vomiting. (Patient taking differently: Take 25 mg by mouth every 4 (four) hours as needed for nausea or vomiting (with hydromorphone dose).)   Vitamin D, Ergocalciferol, (DRISDOL) 1.25 MG (50000 UNIT) CAPS capsule Take 1 capsule by mouth once a week.   XARELTO 20 MG TABS tablet Take 1 tablet (20 mg total) by mouth daily with supper. (Patient taking differently: Take 20  mg by mouth at bedtime.)   [DISCONTINUED] HYDROmorphone (DILAUDID) 4 MG tablet Take 1 tablet (4 mg total) by mouth every 6 (six) hours as needed (for pain).   fluticasone (FLONASE) 50 MCG/ACT nasal spray Place 1 spray into both nostrils daily as needed for allergies or rhinitis. (Patient not taking: Reported on 08/19/2022)   HYDROmorphone (DILAUDID) 4 MG tablet Take 1 tablet (4 mg total) by mouth every 6 (six) hours as needed (for pain).   [DISCONTINUED] HYDROmorphone (DILAUDID) 4 MG tablet Take 1 tablet (4 mg total) by mouth every 6 (six) hours as needed (for pain).   [DISCONTINUED] dextrose 5 % and 0.45 % NaCl infusion    [DISCONTINUED] GENERIC EXTERNAL MEDICATION    [DISCONTINUED] heparin lock flush 100 unit/mL    [DISCONTINUED] HYDROmorphone (DILAUDID) injection    No facility-administered encounter medications on file as of 10/16/2022.     Review of Systems  Review of Systems  Constitutional: Negative.   HENT: Negative.    Cardiovascular: Negative.   Gastrointestinal: Negative.   Allergic/Immunologic: Negative.   Neurological: Negative.   Psychiatric/Behavioral: Negative.         Physical Exam  BP (!) 89/63   Pulse 87   Temp 97.9 F (36.6 C)   Ht 5\' 3"  (1.6 m)   Wt 108 lb 6.4 oz (49.2 kg)   LMP 09/26/2022   SpO2 100%   BMI 19.20 kg/m   Wt Readings from Last 5 Encounters:  10/16/22 108 lb 6.4 oz (49.2 kg)  07/18/22 114 lb (51.7 kg)  05/23/22 114 lb (51.7 kg)  04/03/22 112 lb 7 oz (51 kg)  02/20/22 112 lb 6.4 oz (51 kg)     Physical Exam Vitals and nursing note reviewed.  Constitutional:      General: She is not in acute distress.    Appearance: She is well-developed.  Cardiovascular:     Rate and Rhythm: Normal rate and regular rhythm.  Pulmonary:     Effort: Pulmonary effort is normal.     Breath sounds: Normal breath sounds.  Neurological:     Mental Status: She is alert and oriented to person, place, and time.      Lab Results:  CBC     Component Value Date/Time   WBC 16.1 (H) 05/23/2022 1421   WBC 12.2 (H) 04/04/2022 0419   RBC 2.59 (LL) 05/23/2022 1421   RBC 2.16 (L) 04/04/2022 0419   HGB 7.8 (L) 05/23/2022 1421   HCT 23.0 (L) 05/23/2022 1421   PLT 654 (H) 05/23/2022 1421   MCV 89 05/23/2022 1421   MCH 30.1 05/23/2022 1421   MCH 32.9 04/04/2022 0419   MCHC 33.9 05/23/2022 1421   MCHC 36.6 (H) 04/04/2022 0419   RDW 18.6 (H) 05/23/2022 1421   LYMPHSABS 3.3 (H) 05/23/2022 1421   MONOABS 1.5 (H) 04/02/2022 1051   EOSABS 0.8 (H) 05/23/2022 1421   BASOSABS 0.2  05/23/2022 1421    BMET    Component Value Date/Time   NA 136 05/23/2022 1421   K 3.6 05/23/2022 1421   CL 103 05/23/2022 1421   CO2 20 05/23/2022 1421   GLUCOSE 118 (H) 05/23/2022 1421   GLUCOSE 112 (H) 04/04/2022 0419   BUN 9 05/23/2022 1421   CREATININE 0.61 05/23/2022 1421   CALCIUM 9.2 05/23/2022 1421   GFRNONAA >60 04/04/2022 0419   GFRAA 136 08/02/2020 1538    BNP    Component Value Date/Time   BNP 70.6 05/05/2019 0204      Assessment & Plan:   Sickle cell anemia with pain (HCC) - HYDROmorphone (DILAUDID) 4 MG tablet; Take 1 tablet (4 mg total) by mouth every 6 (six) hours as needed (for pain).  Dispense: 60 tablet; Refill: 0  2. Chronic pain syndrome  - 161096 11+Oxyco+Alc+Crt-Bund  Follow up:  Follow up in 1 month     [name], NP 10/16/2022

## 2022-10-16 NOTE — Patient Instructions (Signed)
1. Sickle cell anemia with pain (HCC)  - HYDROmorphone (DILAUDID) 4 MG tablet; Take 1 tablet (4 mg total) by mouth every 6 (six) hours as needed (for pain).  Dispense: 60 tablet; Refill: 0  2. Chronic pain syndrome  - 161096 11+Oxyco+Alc+Crt-Bund  Follow up:  Follow up in 1 month

## 2022-10-18 LAB — DRUG SCREEN 764883 11+OXYCO+ALC+CRT-BUND
Amphetamines, Urine: NEGATIVE ng/mL
BENZODIAZ UR QL: NEGATIVE ng/mL
Barbiturate: NEGATIVE ng/mL
Cannabinoid Quant, Ur: NEGATIVE ng/mL
Cocaine (Metabolite): NEGATIVE ng/mL
Creatinine: 47 mg/dL (ref 20.0–300.0)
Ethanol: NEGATIVE %
Meperidine: NEGATIVE ng/mL
Methadone Screen, Urine: NEGATIVE ng/mL
OPIATE SCREEN URINE: NEGATIVE ng/mL
Oxycodone/Oxymorphone, Urine: NEGATIVE ng/mL
Phencyclidine: NEGATIVE ng/mL
Propoxyphene: NEGATIVE ng/mL
Tramadol: NEGATIVE ng/mL
pH, Urine: 6.3 (ref 4.5–8.9)

## 2022-10-29 ENCOUNTER — Other Ambulatory Visit: Payer: Self-pay | Admitting: Nurse Practitioner

## 2022-10-29 DIAGNOSIS — D5700 Hb-SS disease with crisis, unspecified: Secondary | ICD-10-CM

## 2022-10-30 ENCOUNTER — Other Ambulatory Visit (HOSPITAL_COMMUNITY): Payer: Self-pay

## 2022-10-30 MED ORDER — HYDROMORPHONE HCL 4 MG PO TABS
4.0000 mg | ORAL_TABLET | Freq: Four times a day (QID) | ORAL | 0 refills | Status: DC | PRN
Start: 2022-10-30 — End: 2022-11-12
  Filled 2022-10-30: qty 60, 15d supply, fill #0

## 2022-10-30 NOTE — Telephone Encounter (Signed)
Please advised. KH 

## 2022-10-31 ENCOUNTER — Other Ambulatory Visit (HOSPITAL_COMMUNITY): Payer: Self-pay

## 2022-10-31 ENCOUNTER — Other Ambulatory Visit: Payer: Self-pay

## 2022-11-06 ENCOUNTER — Telehealth: Payer: Self-pay

## 2022-11-06 NOTE — Transitions of Care (Post Inpatient/ED Visit) (Signed)
   11/06/2022  Name: Molly Gutierrez MRN: [id number] DOB: [birth year]  Today's TOC FU Call Status: Today's TOC FU Call Status:: Unsuccessul Call (1st Attempt) Unsuccessful Call (1st Attempt) Date: 11/06/22  Attempted to reach the patient regarding the most recent Inpatient/ED visit.  Follow Up Plan: Additional outreach attempts will be made to reach the patient to complete the Transitions of Care (Post Inpatient/ED visit) call.   Signature  Agnes Lawrence, CMA (AAMA)  [facility] [phone]

## 2022-11-12 ENCOUNTER — Other Ambulatory Visit: Payer: Self-pay | Admitting: Nurse Practitioner

## 2022-11-12 DIAGNOSIS — D5700 Hb-SS disease with crisis, unspecified: Secondary | ICD-10-CM

## 2022-11-12 NOTE — Transitions of Care (Post Inpatient/ED Visit) (Unsigned)
   11/12/2022  Name: Molly Gutierrez MRN: [id number] DOB: 07-21-1980  Today's TOC FU Call Status: Today's TOC FU Call Status:: Unsuccessful Call (2nd Attempt) Unsuccessful Call (1st Attempt) Date: 11/06/22 Unsuccessful Call (2nd Attempt) Date: 11/12/22  Attempted to reach the patient regarding the most recent Inpatient/ED visit.  Follow Up Plan: Additional outreach attempts will be made to reach the patient to complete the Transitions of Care (Post Inpatient/ED visit) call.   Signature Agnes Lawrence, CMA (AAMA)  [facility] [phone]

## 2022-11-13 ENCOUNTER — Ambulatory Visit: Payer: Self-pay | Admitting: Nurse Practitioner

## 2022-11-13 ENCOUNTER — Other Ambulatory Visit (HOSPITAL_COMMUNITY): Payer: Self-pay

## 2022-11-13 MED ORDER — HYDROMORPHONE HCL 4 MG PO TABS
4.0000 mg | ORAL_TABLET | Freq: Four times a day (QID) | ORAL | 0 refills | Status: DC | PRN
Start: 2022-11-14 — End: 2022-11-26
  Filled 2022-11-14: qty 60, 15d supply, fill #0

## 2022-11-13 NOTE — Transitions of Care (Post Inpatient/ED Visit) (Signed)
   11/13/2022  Name: Molly Gutierrez MRN: [id number] DOB: 06-07-1981  Today's TOC FU Call Status: Today's TOC FU Call Status:: Unsuccessful Call (3rd Attempt) Unsuccessful Call (1st Attempt) Date: 11/06/22 Unsuccessful Call (2nd Attempt) Date: 11/12/22 Unsuccessful Call (3rd Attempt) Date: 11/13/22 [facility] FU Call Complete Date: 11/13/22  Attempted to reach the patient regarding the most recent Inpatient/ED visit.  Follow Up Plan: No further outreach attempts will be made at this time. We have been unable to contact the patient.  Signature Agnes Lawrence, CMA (AAMA)  [facility] [phone]

## 2022-11-13 NOTE — Telephone Encounter (Signed)
Please advise KH 

## 2022-11-14 ENCOUNTER — Other Ambulatory Visit (HOSPITAL_COMMUNITY): Payer: Self-pay

## 2022-11-14 ENCOUNTER — Other Ambulatory Visit: Payer: Self-pay

## 2022-11-26 ENCOUNTER — Other Ambulatory Visit: Payer: Self-pay | Admitting: Nurse Practitioner

## 2022-11-26 DIAGNOSIS — D5700 Hb-SS disease with crisis, unspecified: Secondary | ICD-10-CM

## 2022-11-27 ENCOUNTER — Other Ambulatory Visit: Payer: Self-pay | Admitting: Nurse Practitioner

## 2022-11-27 MED ORDER — HYDROMORPHONE HCL 4 MG PO TABS
4.0000 mg | ORAL_TABLET | Freq: Four times a day (QID) | ORAL | 0 refills | Status: DC | PRN
Start: 2022-11-28 — End: 2022-12-13
  Filled 2022-11-28: qty 60, 15d supply, fill #0

## 2022-11-27 NOTE — Telephone Encounter (Signed)
Please advise KH 

## 2022-11-28 ENCOUNTER — Other Ambulatory Visit: Payer: Self-pay

## 2022-11-28 ENCOUNTER — Other Ambulatory Visit (HOSPITAL_COMMUNITY): Payer: Self-pay

## 2022-12-02 ENCOUNTER — Telehealth: Payer: Self-pay

## 2022-12-02 NOTE — Transitions of Care (Post Inpatient/ED Visit) (Signed)
   12/02/2022  Name: Suan Sillman MRN: [id number] DOB: Nov 08, 1980  Today's TOC FU Call Status: Today's TOC FU Call Status:: Unsuccessul Call (1st Attempt) Unsuccessful Call (1st Attempt) Date: 12/02/22  Attempted to reach the patient regarding the most recent Inpatient/ED visit.  Follow Up Plan: Additional outreach attempts will be made to reach the patient to complete the Transitions of Care (Post Inpatient/ED visit) call.   Signature TB,CMA

## 2022-12-04 ENCOUNTER — Telehealth: Payer: Self-pay

## 2022-12-04 NOTE — Transitions of Care (Post Inpatient/ED Visit) (Signed)
12/04/2022  Name: Molly Gutierrez MRN: [id number] DOB: 1981-06-09  Today's TOC FU Call Status: Today's TOC FU Call Status:: Successful TOC FU Call Competed TOC FU Call Complete Date: 12/04/22  Transition Care Management Follow-up Telephone Call Date of Discharge: 11/29/22 Discharge Facility: Other [place]) Name of Other [facility]) Discharge Facility: [facility] Type of Discharge: Inpatient Admission Primary Inpatient Discharge Diagnosis:: SS How have you been since you were released from the hospital?: Better Any questions or concerns?: No  Items Reviewed: Did you receive and understand the discharge instructions provided?: Yes Medications obtained,verified, and reconciled?: Yes (Medications Reviewed) Any new allergies since your discharge?: No Dietary orders reviewed?: NA Do you have support at home?: Yes People in Home: child(ren), dependent, parent(s)  Medications Reviewed Today: Medications Reviewed Today     Reviewed by Molly Andrew, NP (Nurse Practitioner) on 10/16/22 at 1443  Med List Status: <None>   Medication Order Taking? Sig Documenting Provider Last Dose Status Informant  acetaminophen (TYLENOL) 500 MG tablet [id number] Yes Take 500 mg by mouth every 6 (six) hours as needed for mild pain or headache (or cramps). [provider] Taking Active Self  albuterol (VENTOLIN HFA) 108 (90 Base) MCG/ACT inhaler [id number] Yes Inhale 2 puffs into the lungs every 6 (six) hours as needed for wheezing or shortness of breath. Molly Andrew, NP Taking Active Self  Cholecalciferol (VITAMIN D3 PO) [id number] Yes Take 1 capsule by mouth at bedtime. [provider] Taking Active Self  Cyanocobalamin (VITAMIN B-12 PO) [id number] Yes Take 1 tablet by mouth at bedtime. [provider] Taking Active Self  diphenhydrAMINE (BENADRYL) 25 MG tablet [id number] Yes Take 25 mg by mouth every 4 (four) hours as needed for itching (with hydromorphone dose).  [provider] Taking Active Self           Med Note [name], REGEENA   Thu Oct 05, 2020  5:11 PM)    fluticasone (FLONASE) 50 MCG/ACT nasal spray [id number]  Place 1 spray into both nostrils daily as needed for allergies or rhinitis.  Patient not taking: Reported on 08/19/2022   Molly Andrew, NP  Expired 04/02/22 2359 Self  FOLIC ACID PO [id number] Yes Take 1 tablet by mouth at bedtime. [provider] Taking Active Self           Med Note Molly Gutierrez, [name]   Tue Feb 05, 2022  1:00 PM) OTC - unknown strength  HYDROmorphone (DILAUDID) 4 MG tablet [id number]  Take 1 tablet (4 mg total) by mouth every 6 (six) hours as needed (for pain). Molly Andrew, NP  Active   mirtazapine (REMERON) 45 MG tablet [id number] Yes Take 1 tablet (45 mg total) by mouth at bedtime. Molly Andrew, NP Taking Active Self  mometasone-formoterol (DULERA) 100-5 MCG/ACT [name] [id number] Yes Inhale 2 puffs into the lungs daily as needed for wheezing or shortness of breath. Molly Andrew, NP Taking Active Self  naloxone [facility]) nasal spray 4 mg/0.1 mL [id number] Yes Place 0.4 mg into the nose once as needed (opioid overdose). [provider] Taking Active Self           Med Note Antony Madura, Linna Gutierrez Apr 02, 2022  4:29 PM) PROVIDER: The patient thinks her medication has EXPIRED and would appreciate a new Rx, please  OXBRYTA 500 MG TABS tablet [id number] Yes Take by mouth. [provider] Taking Active   polyethylene glycol (MIRALAX / GLYCOLAX) 17 g packet [id number] Yes Take 17 g  by mouth daily as needed for mild constipation (MIX AS DIRECTED AND DRINK). [provider] Taking Active Self           Med Note [name], REGEENA   Thu Oct 05, 2020  5:16 PM)    promethazine (PHENERGAN) 25 MG tablet [id number] Yes Take 0.5-1 tablets (12.5-25 mg total) by mouth every 6 (six) hours as needed for nausea or vomiting.  Patient taking differently: Take 25 mg by mouth every 4 (four) hours as  needed for nausea or vomiting (with hydromorphone dose).   Molly Andrew, NP Taking Active Self  Vitamin D, Ergocalciferol, (DRISDOL) 1.25 MG (50000 UNIT) CAPS capsule [id number] Yes Take 1 capsule by mouth once a week. [provider] Taking Active   XARELTO 20 MG TABS tablet [id number] Yes Take 1 tablet (20 mg total) by mouth daily with supper.  Patient taking differently: Take 20 mg by mouth at bedtime.   Molly Andrew, NP Taking Active Self  Med List Note Molly Gutierrez, CPhT 04/02/22 1640): The "k" is in the patient's first name is SILENT              Home Care and Equipment/Supplies: Were Home Health Services Ordered?: No Any new equipment or medical supplies ordered?: No  Functional Questionnaire: Do you need assistance with bathing/showering or dressing?: No Do you need assistance with meal preparation?: No Do you need assistance with eating?: No Do you have difficulty maintaining continence: No Do you need assistance with getting out of bed/getting out of a chair/moving?: No Do you have difficulty managing or taking your medications?: No  Follow up appointments reviewed: PCP Follow-up appointment confirmed?: Yes Date of PCP follow-up appointment?: 12/16/22 Follow-up Provider: [facility] Follow-up appointment confirmed?: NA Do you need transportation to your follow-up appointment?: No Do you understand care options if your condition(s) worsen?: Yes-patient verbalized understanding    SIGNATURE TB,CMA

## 2022-12-09 ENCOUNTER — Telehealth (HOSPITAL_COMMUNITY): Payer: Self-pay | Admitting: General Practice

## 2022-12-09 ENCOUNTER — Encounter (HOSPITAL_COMMUNITY): Payer: Self-pay | Admitting: Family Medicine

## 2022-12-09 ENCOUNTER — Other Ambulatory Visit: Payer: Self-pay

## 2022-12-09 ENCOUNTER — Inpatient Hospital Stay (HOSPITAL_COMMUNITY)
Admission: AD | Admit: 2022-12-09 | Discharge: 2022-12-13 | DRG: 812 | Disposition: A | Discharge status: Home / Self Care | Payer: Medicare HMO | Source: Ambulatory Visit | Attending: Internal Medicine | Admitting: Internal Medicine

## 2022-12-09 DIAGNOSIS — D5700 Hb-SS disease with crisis, unspecified: Principal | ICD-10-CM | POA: Diagnosis present

## 2022-12-09 DIAGNOSIS — G894 Chronic pain syndrome: Secondary | ICD-10-CM | POA: Diagnosis present

## 2022-12-09 DIAGNOSIS — Z83438 Family history of other disorder of lipoprotein metabolism and other lipidemia: Secondary | ICD-10-CM

## 2022-12-09 DIAGNOSIS — Z833 Family history of diabetes mellitus: Secondary | ICD-10-CM | POA: Diagnosis not present

## 2022-12-09 DIAGNOSIS — J45909 Unspecified asthma, uncomplicated: Secondary | ICD-10-CM | POA: Diagnosis present

## 2022-12-09 DIAGNOSIS — Z7951 Long term (current) use of inhaled steroids: Secondary | ICD-10-CM | POA: Diagnosis not present

## 2022-12-09 DIAGNOSIS — Z888 Allergy status to other drugs, medicaments and biological substances status: Secondary | ICD-10-CM | POA: Diagnosis not present

## 2022-12-09 DIAGNOSIS — F1120 Opioid dependence, uncomplicated: Secondary | ICD-10-CM | POA: Diagnosis present

## 2022-12-09 DIAGNOSIS — J4520 Mild intermittent asthma, uncomplicated: Secondary | ICD-10-CM | POA: Diagnosis present

## 2022-12-09 DIAGNOSIS — D72829 Elevated white blood cell count, unspecified: Secondary | ICD-10-CM | POA: Diagnosis present

## 2022-12-09 DIAGNOSIS — Z79899 Other long term (current) drug therapy: Secondary | ICD-10-CM | POA: Diagnosis not present

## 2022-12-09 DIAGNOSIS — Z8249 Family history of ischemic heart disease and other diseases of the circulatory system: Secondary | ICD-10-CM | POA: Diagnosis not present

## 2022-12-09 DIAGNOSIS — Z841 Family history of disorders of kidney and ureter: Secondary | ICD-10-CM

## 2022-12-09 DIAGNOSIS — Z86711 Personal history of pulmonary embolism: Secondary | ICD-10-CM | POA: Diagnosis not present

## 2022-12-09 DIAGNOSIS — Z9049 Acquired absence of other specified parts of digestive tract: Secondary | ICD-10-CM | POA: Diagnosis not present

## 2022-12-09 DIAGNOSIS — Z7901 Long term (current) use of anticoagulants: Secondary | ICD-10-CM

## 2022-12-09 LAB — CBC
HCT: 21 % — ABNORMAL LOW (ref 36.0–46.0)
Hemoglobin: 7.4 g/dL — ABNORMAL LOW (ref 12.0–15.0)
MCH: 31.5 pg (ref 26.0–34.0)
MCHC: 35.2 g/dL (ref 30.0–36.0)
MCV: 89.4 fL (ref 80.0–100.0)
Platelets: 498 10*3/uL — ABNORMAL HIGH (ref 150–400)
RBC: 2.35 MIL/uL — ABNORMAL LOW (ref 3.87–5.11)
RDW: 19.5 % — ABNORMAL HIGH (ref 11.5–15.5)
WBC: 17.3 10*3/uL — ABNORMAL HIGH (ref 4.0–10.5)
nRBC: 0.4 % — ABNORMAL HIGH (ref 0.0–0.2)

## 2022-12-09 MED ORDER — DIPHENHYDRAMINE HCL 25 MG PO CAPS
25.0000 mg | ORAL_CAPSULE | ORAL | Status: DC | PRN
  Administered 2022-12-09 (×2): 25 mg via ORAL
  Filled 2022-12-09 (×3): qty 1

## 2022-12-09 MED ORDER — HYDROMORPHONE 1 MG/ML IV SOLN
INTRAVENOUS | Status: DC
  Administered 2022-12-09: 30 mg via INTRAVENOUS
  Administered 2022-12-09: 10.5 mg via INTRAVENOUS
  Administered 2022-12-09: 3 mg via INTRAVENOUS
  Administered 2022-12-10: 30 mg via INTRAVENOUS
  Administered 2022-12-10: 1 mg via INTRAVENOUS
  Administered 2022-12-10: 3 mg via INTRAVENOUS
  Administered 2022-12-10: 2 mg via INTRAVENOUS
  Administered 2022-12-10: 3.5 mg via INTRAVENOUS
  Administered 2022-12-10: 3 mg via INTRAVENOUS
  Administered 2022-12-10: 1.5 mg via INTRAVENOUS
  Administered 2022-12-11: 1 mg via INTRAVENOUS
  Administered 2022-12-11: 2.5 mg via INTRAVENOUS
  Administered 2022-12-11: 2.9 mg via INTRAVENOUS
  Administered 2022-12-11: 4.4 mg via INTRAVENOUS
  Filled 2022-12-09 (×2): qty 30

## 2022-12-09 MED ORDER — SODIUM CHLORIDE 0.9% FLUSH: 10.0000 mL | INTRAVENOUS | Status: DC | PRN

## 2022-12-09 MED ORDER — SENNOSIDES-DOCUSATE SODIUM 8.6-50 MG PO TABS
1.0000 | ORAL_TABLET | Freq: Two times a day (BID) | ORAL | Status: DC
  Administered 2022-12-09 – 2022-12-13 (×8): 1 via ORAL
  Filled 2022-12-09 (×8): qty 1

## 2022-12-09 MED ORDER — KETOROLAC TROMETHAMINE 15 MG/ML IJ SOLN
15.0000 mg | Freq: Four times a day (QID) | INTRAMUSCULAR | Status: DC
  Administered 2022-12-09 – 2022-12-13 (×15): 15 mg via INTRAVENOUS
  Filled 2022-12-09 (×15): qty 1

## 2022-12-09 MED ORDER — HEPARIN SOD (PORK) LOCK FLUSH 100 UNIT/ML IV SOLN
500.0000 [IU] | INTRAVENOUS | Status: DC | PRN
  Filled 2022-12-09: qty 5

## 2022-12-09 MED ORDER — SODIUM CHLORIDE 0.45 % IV SOLN: INTRAVENOUS | Status: DC

## 2022-12-09 MED ORDER — FOLIC ACID 1 MG PO TABS
1.0000 mg | ORAL_TABLET | Freq: Every day | ORAL | Status: DC
  Administered 2022-12-10 – 2022-12-13 (×4): 1 mg via ORAL
  Filled 2022-12-09 (×4): qty 1

## 2022-12-09 MED ORDER — MOMETASONE FURO-FORMOTEROL FUM 100-5 MCG/ACT IN AERO
2.0000 | INHALATION_SPRAY | Freq: Two times a day (BID) | RESPIRATORY_TRACT | Status: DC
  Administered 2022-12-10 – 2022-12-13 (×6): 2 via RESPIRATORY_TRACT
  Filled 2022-12-09: qty 8.8

## 2022-12-09 MED ORDER — ONDANSETRON HCL 4 MG/2ML IJ SOLN
4.0000 mg | Freq: Four times a day (QID) | INTRAMUSCULAR | Status: DC | PRN
  Administered 2022-12-10: 4 mg via INTRAVENOUS
  Filled 2022-12-09: qty 2

## 2022-12-09 MED ORDER — RIVAROXABAN 20 MG PO TABS
20.0000 mg | ORAL_TABLET | Freq: Every day | ORAL | Status: DC
  Administered 2022-12-09 – 2022-12-12 (×4): 20 mg via ORAL
  Filled 2022-12-09 (×5): qty 1

## 2022-12-09 MED ORDER — KETOROLAC TROMETHAMINE 30 MG/ML IJ SOLN
15.0000 mg | Freq: Once | INTRAMUSCULAR | Status: AC
  Administered 2022-12-09: 15 mg via INTRAVENOUS
  Filled 2022-12-09: qty 1

## 2022-12-09 MED ORDER — POLYETHYLENE GLYCOL 3350 17 G PO PACK
17.0000 g | PACK | Freq: Every day | ORAL | Status: DC | PRN
  Administered 2022-12-11 – 2022-12-12 (×2): 17 g via ORAL
  Filled 2022-12-09 (×2): qty 1

## 2022-12-09 MED ORDER — VOXELOTOR 500 MG PO TABS: 1000.0000 mg | ORAL_TABLET | Freq: Every day | ORAL | Status: DC

## 2022-12-09 MED ORDER — VITAMIN D 25 MCG (1000 UNIT) PO TABS
1000.0000 [IU] | ORAL_TABLET | Freq: Every day | ORAL | Status: DC
  Administered 2022-12-10 – 2022-12-13 (×4): 1000 [IU] via ORAL
  Filled 2022-12-09 (×4): qty 1

## 2022-12-09 MED ORDER — SODIUM CHLORIDE 0.9% FLUSH: 9.0000 mL | INTRAVENOUS | Status: DC | PRN

## 2022-12-09 MED ORDER — ALBUTEROL SULFATE (2.5 MG/3ML) 0.083% IN NEBU: 2.5000 mg | INHALATION_SOLUTION | Freq: Four times a day (QID) | RESPIRATORY_TRACT | Status: DC | PRN

## 2022-12-09 MED ORDER — NALOXONE HCL 0.4 MG/ML IJ SOLN: 0.4000 mg | INTRAMUSCULAR | Status: DC | PRN

## 2022-12-09 NOTE — Telephone Encounter (Signed)
Patient called, requesting to come to the day hospital due to pain in the lower back and legs   rated at 9/10. Denied chest pain, fever, diarrhea, abdominal pain. Endorsed nausea/vomitting. Last vomited at about 05:00 am today. Screened negative for Covid-19 symptoms. Admitted to having means of transportation ("brother") without driving self after treatment. Last took 4 mg at about 22:00 on 12/08/22. Per provider, patient can come to the day hospital. And also, admission from the [facility] to the main hospital will not be available today. That patient will be discharged home after treatment at the [facility] today. Patient  verbalized understanding.

## 2022-12-09 NOTE — Progress Notes (Signed)
Patient admitted to the day hospital for treatment of sickle cell pain crisis. Patient reported pain rated 9/10 in the lower back and legs . Patient placed on Dilaudid PCA, given IV Toradol, PO Benadryl and hydrated with IV fluids.Patient transferred to [facility] room 6. Report was given to [facility] RN. Reported  pain was 7/10 on transfer. Alert, oriented and transported in a wheelchair.

## 2022-12-09 NOTE — H&P (Signed)
H&P  Patient Demographics:  Molly Gutierrez, is a 42 y.o. female  MRN: [id number]   DOB - 09-Aug-1980  Admit Date - 12/09/2022  Outpatient Primary MD for the patient is [name], NP       HPI:   Molly Gutierrez  is a 42 y.o. female is a 42 year old female with a medical history significant for sickle cell disease, chronic pain syndrome, opiate dependence and tolerance, history of PE on Xarelto, and mild intermittent asthma, and anemia of chronic disease presented to sickle cell day infusion clinic for management of her sickle cell crisis.  Patient states that 2 days prior, she awakened with allover body pain that was unrelieved by her home medication.  Patient was treated and evaluated in the emergency department on yesterday for this problem with very little relief.  She has also been taking hydromorphone at home for pain control.  She has not identified any inciting factors concerning her crisis.  She currently rates pain as 9/10, constant, and occasionally sharp.  Patient denies fever, chills, shortness of breath, or dizziness.  No urinary symptoms, nausea, vomiting, or diarrhea.  No sick contacts or recent travel.  Sickle cell day clinic course: While at sickle cell clinic, patient's vital signs remained stable however:BP 96/66 (BP Location: Left Arm)   Pulse 87   Temp 99 F (37.2 C) (Oral)   Resp 12   LMP 12/09/2022   SpO2 94%  Complete blood count notable for WBCs 17.3, hemoglobin 7.4 g/dL, and platelets 409,811.  Basic metabolic panel unremarkable.  Reticulocytes, 8.6%.  Chest x-ray pending.  Pain persists despite IV Dilaudid PCA, IV fluids, and IV Toradol.  Patient will be admitted to [facility] for further management of her sickle cell pain crisis.   Review of systems:  Review of Systems  Constitutional: Negative.   HENT: Negative.    Eyes: Negative.   Respiratory: Negative.    Cardiovascular:  Positive for chest pain.  Gastrointestinal: Negative.    Genitourinary: Negative.   Musculoskeletal:  Positive for back pain and joint pain.  Skin: Negative.   Neurological: Negative.   Psychiatric/Behavioral: Negative.       With Past History of the following :   Past Medical History:  Diagnosis Date   Asthma    Eczema    History of pulmonary embolus (PE)    Sickle cell anemia (HCC)       Past Surgical History:  Procedure Laterality Date   CHOLECYSTECTOMY     ERCP     JOINT REPLACEMENT     PORTA CATH INSERTION     TUBAL LIGATION     WISDOM TOOTH EXTRACTION       Social History:   Social History   Tobacco Use   Smoking status: Never   Smokeless tobacco: Never  Substance Use Topics   Alcohol use: Never     Lives - At home   Family History :   Family History  Problem Relation Age of Onset   Renal Disease Mother    Hypertension Mother    High Cholesterol Mother    Heart attack Mother    Diabetes Brother      Home Medications:   Prior to Admission medications   Medication Sig Start Date End Date Taking? Authorizing Provider  Cholecalciferol (VITAMIN D3 PO) Take 1 capsule by mouth at bedtime.   Yes [provider]  Cyanocobalamin (VITAMIN B-12 PO) Take 1 tablet by mouth at bedtime.   Yes [provider]  diphenhydrAMINE (BENADRYL) 25 MG tablet Take 25 mg by mouth every 4 (four) hours as needed for itching (with hydromorphone dose).   Yes [provider]  FOLIC ACID PO Take 1 tablet by mouth at bedtime.   Yes [provider]  HYDROmorphone (DILAUDID) 4 MG tablet Take 1 tablet (4 mg total) by mouth every 6 (six) hours as needed (for pain). 11/28/22  Yes [name], NP  acetaminophen (TYLENOL) 500 MG tablet Take 500 mg by mouth every 6 (six) hours as needed for mild pain or headache (or cramps).    [provider]  albuterol (VENTOLIN HFA) 108 (90 Base) MCG/ACT inhaler Inhale 2 puffs into the lungs every 6 (six) hours as needed for wheezing or shortness of breath.  10/29/21   [name], NP  fluticasone (FLONASE) 50 MCG/ACT nasal spray Place 1 spray into both nostrils daily as needed for allergies or rhinitis. Patient not taking: Reported on 08/19/2022 12/31/21 04/02/22  [name], NP  mirtazapine (REMERON) 45 MG tablet Take 1 tablet (45 mg total) by mouth at bedtime. 10/29/21 10/29/22  [name], NP  mometasone-formoterol (DULERA) 100-5 MCG/ACT AERO Inhale 2 puffs into the lungs daily as needed for wheezing or shortness of breath. 10/29/21 10/29/22  [name], NP  naloxone [facility]) nasal spray 4 mg/0.1 mL Place 0.4 mg into the nose once as needed (opioid overdose). 11/01/21   [provider]  OXBRYTA 500 MG TABS tablet Take by mouth. 07/26/22   [provider]  polyethylene glycol (MIRALAX / GLYCOLAX) 17 g packet Take 17 g by mouth daily as needed for mild constipation (MIX AS DIRECTED AND DRINK).    [provider]  promethazine (PHENERGAN) 25 MG tablet Take 0.5-1 tablets (12.5-25 mg total) by mouth every 6 (six) hours as needed for nausea or vomiting. Patient taking differently: Take 25 mg by mouth every 4 (four) hours as needed for nausea or vomiting (with hydromorphone dose). 10/31/21 10/31/22  [name], NP  Vitamin D, Ergocalciferol, (DRISDOL) 1.25 MG (50000 UNIT) CAPS capsule Take 1 capsule by mouth once a week. 07/19/22 07/19/23  [provider]  XARELTO 20 MG TABS tablet Take 1 tablet (20 mg total) by mouth daily with supper. Patient taking differently: Take 20 mg by mouth at bedtime. 10/29/21 10/29/22  [name], NP     Allergies:   Allergies  Allergen Reactions   Cefaclor Hives and Swelling   Hydroxyurea Palpitations and Other (See Comments)    Lowers "blood levels" and heart rate (causes HYPOtension); "it messes me up, it drops my levels and stuff"    Omeprazole Other (See Comments)    Causes "sharp pains in the stomach"   Ketamine Palpitations and Other (See Comments)    "Pt  states she has had previous reaction to ketamine. States she becomes flushed, heart races, dizzy, and feels like she is going to pass out."     Physical Exam:   Vitals:   Vitals:   12/09/22 1531 12/09/22 1603  BP: 103/65 96/66  Pulse: 87   Resp: 14 12  Temp:    SpO2: 95% 94%    Physical Exam: Constitutional: Patient appears well-developed and well-nourished. Not in obvious distress. HENT: Normocephalic, atraumatic, External right and left ear normal. Oropharynx is clear and moist.  Eyes: Conjunctivae and EOM are normal. PERRLA, no scleral icterus. Neck: Normal ROM. Neck supple. No JVD. No tracheal deviation. No thyromegaly. CVS: RRR, S1/S2 +, no  murmurs, no gallops, no carotid bruit.  Pulmonary: Effort and breath sounds normal, no stridor, rhonchi, wheezes, rales.  Abdominal: Soft. BS +, no distension, tenderness, rebound or guarding.  Musculoskeletal: Normal range of motion. No edema and no tenderness.  Lymphadenopathy: No lymphadenopathy noted, cervical, inguinal or axillary Neuro: Alert. Normal reflexes, muscle tone coordination. No cranial nerve deficit. Skin: Skin is warm and dry. No rash noted. Not diaphoretic. No erythema. No pallor. Psychiatric: Normal mood and affect. Behavior, judgment, thought content normal.   Data Review:   CBC Recent Labs  Lab 12/09/22 1115  WBC 17.3*  HGB 7.4*  HCT 21.0*  PLT 498*  MCV 89.4  MCH 31.5  MCHC 35.2  RDW 19.5*   ------------------------------------------------------------------------------------------------------------------  Chemistries  No results for input(s): "NA", "K", "CL", "CO2", "GLUCOSE", "BUN", "CREATININE", "CALCIUM", "MG", "AST", "ALT", "ALKPHOS", "BILITOT" in the last 168 hours.  Invalid input(s): "GFRCGP" ------------------------------------------------------------------------------------------------------------------ CrCl cannot be calculated (Patient's most recent lab result is older than the maximum 21  days allowed.). ------------------------------------------------------------------------------------------------------------------ No results for input(s): "TSH", "T4TOTAL", "T3FREE", "THYROIDAB" in the last 72 hours.  Invalid input(s): "FREET3"  Coagulation profile No results for input(s): "INR", "PROTIME" in the last 168 hours. ------------------------------------------------------------------------------------------------------------------- No results for input(s): "DDIMER" in the last 72 hours. -------------------------------------------------------------------------------------------------------------------  Cardiac Enzymes No results for input(s): "CKMB", "TROPONINI", "MYOGLOBIN" in the last 168 hours.  Invalid input(s): "CK" ------------------------------------------------------------------------------------------------------------------    Component Value Date/Time   BNP 70.6 05/05/2019 0204    ---------------------------------------------------------------------------------------------------------------  Urinalysis    Component Value Date/Time   COLORURINE YELLOW 12/11/2021 2103   APPEARANCEUR CLEAR 12/11/2021 2103   LABSPEC 1.011 12/11/2021 2103   PHURINE 5.0 12/11/2021 2103   GLUCOSEU NEGATIVE 12/11/2021 2103   HGBUR NEGATIVE 12/11/2021 2103   BILIRUBINUR NEGATIVE 12/11/2021 2103   BILIRUBINUR neg 08/03/2020 1429   KETONESUR NEGATIVE 12/11/2021 2103   PROTEINUR NEGATIVE 12/11/2021 2103   UROBILINOGEN 1.0 08/03/2020 1429   NITRITE NEGATIVE 12/11/2021 2103   LEUKOCYTESUR NEGATIVE 12/11/2021 2103    ----------------------------------------------------------------------------------------------------------------   Imaging Results:    No results found.   Assessment & Plan:  Principal Problem:   Sickle cell pain crisis (HCC) Active Problems:   History of pulmonary embolism   Asthma   Opioid dependence (HCC)   Mild intermittent asthma without  complication  Sickle cell disease with pain crisis: Admit patient.  Continue IV fluids, 0.45% saline at 50 mL/h IV Dilaudid PCA with settings of 0.5 mg, 10-minute lockout, and 3 mg/h. Toradol 15 mg IV every 6 hours Monitor vital signs very closely, reevaluate pain scale regularly, and supplemental oxygen as needed.  Chronic pain syndrome: Hold home hydromorphone, continue PCA  Leukocytosis: WBCs 17.3.  No signs of acute infection.  More than likely secondary to sickle cell crisis.  Monitor closely.  Labs in AM.  Anemia of chronic disease: Hemoglobin 7.4 g/dL, consistent with patient's baseline of 7-8 g/dL.  She is typically not transfused unless hemoglobin is less than 7.  Labs and type and screen in AM.  Defer to sickle cell team for any nonemergent blood transfusions.  Mild intermittent asthma: Stable.  Continue home medications.  History of PE: Continue Xarelto  DVT Prophylaxis: Xarelto and SCDs  AM Labs Ordered, also please review Full Orders  Family Communication: Admission, patient's condition and plan of care including tests being ordered have been discussed with the patient who indicate understanding and agree with the plan and Code Status.  Code Status: Full Code  Consults called: None    Admission status:  Inpatient    Time spent in minutes : 30 minutes  Nolon Nations  APRN, MSN, [facility] Patient [facility] [address] [place], [place] [address] [phone]  12/09/2022 at 4:38 PM

## 2022-12-10 DIAGNOSIS — D5700 Hb-SS disease with crisis, unspecified: Secondary | ICD-10-CM | POA: Diagnosis not present

## 2022-12-10 LAB — HEMOGLOBIN AND HEMATOCRIT, BLOOD
HCT: 22.7 % — ABNORMAL LOW (ref 36.0–46.0)
Hemoglobin: 7.7 g/dL — ABNORMAL LOW (ref 12.0–15.0)

## 2022-12-10 LAB — CBC
HCT: 18.2 % — ABNORMAL LOW (ref 36.0–46.0)
Hemoglobin: 6.5 g/dL — CL (ref 12.0–15.0)
MCH: 31.4 pg (ref 26.0–34.0)
MCHC: 35.7 g/dL (ref 30.0–36.0)
MCV: 87.9 fL (ref 80.0–100.0)
Platelets: 418 10*3/uL — ABNORMAL HIGH (ref 150–400)
RBC: 2.07 MIL/uL — ABNORMAL LOW (ref 3.87–5.11)
RDW: 19 % — ABNORMAL HIGH (ref 11.5–15.5)
WBC: 12.7 10*3/uL — ABNORMAL HIGH (ref 4.0–10.5)
nRBC: 0.3 % — ABNORMAL HIGH (ref 0.0–0.2)

## 2022-12-10 LAB — TYPE AND SCREEN: Antibody Screen: NEGATIVE

## 2022-12-10 LAB — PREPARE RBC (CROSSMATCH)

## 2022-12-10 LAB — HIV ANTIBODY (ROUTINE TESTING W REFLEX): HIV Screen 4th Generation wRfx: NONREACTIVE

## 2022-12-10 MED ORDER — DIPHENHYDRAMINE HCL 50 MG/ML IJ SOLN
12.5000 mg | Freq: Once | INTRAMUSCULAR | Status: AC
  Administered 2022-12-10: 12.5 mg via INTRAVENOUS
  Filled 2022-12-10: qty 1

## 2022-12-10 MED ORDER — SODIUM CHLORIDE 0.9% IV SOLUTION: Freq: Once | INTRAVENOUS | Status: AC

## 2022-12-10 MED ORDER — ACETAMINOPHEN 325 MG PO TABS
650.0000 mg | ORAL_TABLET | Freq: Once | ORAL | Status: AC
  Administered 2022-12-10: 650 mg via ORAL
  Filled 2022-12-10: qty 2

## 2022-12-10 MED ORDER — CHLORHEXIDINE GLUCONATE CLOTH 2 % EX PADS
6.0000 | MEDICATED_PAD | Freq: Every day | CUTANEOUS | Status: DC
  Administered 2022-12-10 – 2022-12-12 (×3): 6 via TOPICAL

## 2022-12-10 MED ORDER — VOXELOTOR 500 MG PO TABS: 1500.0000 mg | ORAL_TABLET | Freq: Every day | ORAL | Status: DC

## 2022-12-10 NOTE — TOC Progression Note (Signed)
Transition of Care [facility]) - Progression Note    Patient Details  Name: Molly Gutierrez MRN: [id number] Date of Birth: 1980/08/10  Transition of Care [facility]) CM/SW Contact  Geni Bers, RN Phone Number: 12/10/2022, 12:08 PM  Clinical Narrative:     Transition of Care (TOC) Screening Note   Patient Details  Name: Molly Gutierrez Date of Birth: 31-May-1981   Transition of Care [facility]) CM/SW Contact:    Geni Bers, RN Phone Number: 12/10/2022, 12:08 PM    Transition of Care Department [facility]) has reviewed patient and no TOC needs have been identified at this time. We will continue to monitor patient advancement through interdisciplinary progression rounds. If new patient transition needs arise, please place a TOC consult.          Expected Discharge Plan and Services         Expected Discharge Date: 12/09/22                                     Social Determinants of Health (SDOH) Interventions SDOH Screenings   Food Insecurity: No Food Insecurity (12/09/2022)  Housing: Patient Declined (12/09/2022)  Transportation Needs: No Transportation Needs (12/09/2022)  Utilities: Not At Risk (12/09/2022)  Alcohol Screen: Low Risk  (03/12/2021)  Depression (PHQ2-9): Low Risk  (10/16/2022)  Financial Resource Strain: Low Risk  (07/18/2022)  Physical Activity: Inactive (07/18/2022)  Social Connections: Moderately Integrated (07/18/2022)  Stress: No Stress Concern Present (07/18/2022)  Tobacco Use: Low Risk  (12/09/2022)    Readmission Risk Interventions    04/03/2022   11:42 AM 10/07/2021    2:43 PM 11/08/2020   11:02 AM  Readmission Risk Prevention Plan  Transportation Screening Complete Complete   Medication Review [employer]) Complete Complete   PCP or Specialist appointment within 3-5 days of discharge Complete Complete   HRI or Home Care Consult Complete    SW Recovery Care/Counseling Consult Complete Complete   Palliative Care Screening Not  Applicable Not Applicable   Skilled Nursing Facility Not Applicable Not Applicable      Information is confidential and restricted. Go to Review Flowsheets to unlock data.

## 2022-12-10 NOTE — Progress Notes (Signed)
Subjective: Molly Gutierrez is a 42 year old female with a medical history significant for sickle cell disease, chronic pain syndrome, opiate dependence and tolerance, mild intermittent asthma, and anemia of chronic disease was admitted for sickle cell pain crisis.  Today, hemoglobin decreased at 6.5, which is below patient's baseline.  Patient denies headache, dizziness, shortness of breath, nausea, vomiting, or diarrhea.   Objective:  Vital signs in last 24 hours:  Vitals:   12/10/22 0014 12/10/22 0125 12/10/22 0347 12/10/22 0521  BP:  95/61  93/65  Pulse:  82  76  Resp: 14 14 14 14   Temp:  98.6 F (37 C)  (!) 97.5 F (36.4 C)  TempSrc:  Oral  Oral  SpO2: 95% 98% 97% 99%  Weight:      Height:        Intake/Output from previous day:   Intake/Output Summary (Last 24 hours) at 12/10/2022 2130 Last data filed at 12/10/2022 8657 Gross per 24 hour  Intake 524 ml  Output --  Net 524 ml    Physical Exam: General: Alert, awake, oriented x3, in no acute distress.  HEENT: [place]/AT PEERL, EOMI Neck: Trachea midline,  no masses, no thyromegal,y no JVD, no carotid bruit OROPHARYNX:  Moist, No exudate/ erythema/lesions.  Heart: Regular rate and rhythm, without murmurs, rubs, gallops, PMI non-displaced, no heaves or thrills on palpation.  Lungs: Clear to auscultation, no wheezing or rhonchi noted. No increased vocal fremitus resonant to percussion  Abdomen: Soft, nontender, nondistended, positive bowel sounds, no masses no hepatosplenomegaly noted..  Neuro: No focal neurological deficits noted cranial nerves II through XII grossly intact. DTRs 2+ bilaterally upper and lower extremities. Strength 5 out of 5 in bilateral upper and lower extremities. Musculoskeletal: No warm swelling or erythema around joints, no spinal tenderness noted. Psychiatric: Patient alert and oriented x3, good insight and cognition, good recent to remote recall. Lymph node survey: No cervical axillary or inguinal  lymphadenopathy noted.  Lab Results:  Basic Metabolic Panel:    Component Value Date/Time   NA 136 05/23/2022 1421   K 3.6 05/23/2022 1421   CL 103 05/23/2022 1421   CO2 20 05/23/2022 1421   BUN 9 05/23/2022 1421   CREATININE 0.61 05/23/2022 1421   GLUCOSE 118 (H) 05/23/2022 1421   GLUCOSE 112 (H) 04/04/2022 0419   CALCIUM 9.2 05/23/2022 1421   CBC:    Component Value Date/Time   WBC 12.7 (H) 12/10/2022 0516   HGB 6.5 (LL) 12/10/2022 0516   HGB 7.8 (L) 05/23/2022 1421   HCT 18.2 (L) 12/10/2022 0516   HCT 23.0 (L) 05/23/2022 1421   PLT 418 (H) 12/10/2022 0516   PLT 654 (H) 05/23/2022 1421   MCV 87.9 12/10/2022 0516   MCV 89 05/23/2022 1421   NEUTROABS 9.9 (H) 05/23/2022 1421   LYMPHSABS 3.3 (H) 05/23/2022 1421   MONOABS 1.5 (H) 04/02/2022 1051   EOSABS 0.8 (H) 05/23/2022 1421   BASOSABS 0.2 05/23/2022 1421    No results found for this or any previous visit (from the past 240 hour(s)).  Studies/Results: No results found.  Medications: Scheduled Meds:  sodium chloride   Intravenous Once   acetaminophen  650 mg Oral Once   cholecalciferol  1,000 Units Oral Daily   diphenhydrAMINE  12.5 mg Intravenous Once   folic acid  1 mg Oral Daily   HYDROmorphone   Intravenous Q4H   ketorolac  15 mg Intravenous Q6H   mometasone-formoterol  2 puff Inhalation BID   rivaroxaban  20 mg Oral Q supper   senna-docusate  1 tablet Oral BID   voxelotor  1,500 mg Oral Daily   Continuous Infusions:  sodium chloride 50 mL/hr at 12/10/22 0344   PRN Meds:.albuterol, diphenhydrAMINE, heparin lock flush, naloxone **AND** sodium chloride flush, ondansetron (ZOFRAN) IV, polyethylene glycol, sodium chloride flush  Consultants: none  Procedures: none  Antibiotics: none  Assessment/Plan: Principal Problem:   Sickle cell pain crisis (HCC) Active Problems:   History of pulmonary embolism   Asthma   Opioid dependence (HCC)   Mild intermittent asthma without complication   Sickle  cell disease with pain crisis:  Continue IV dilaudid PCA Decrease IV fluids to KVO Toradol 15 mg IV every 6 hours.  Monitor vital signs closely, reevaluate pain scale regularly, and supplemental oxygen as needed  Anemia of chronic disease: Patient's hemoglobin is 6.5 g/dL.  Transfuse 1 unit PRBCs.  Follow labs in AM.  Leukocytosis: Improving.  No antibiotics warranted at this time.  Continue to monitor closely.  Chronic pain syndrome: Continue home medications  Mild intermittent asthma: Stable.  Hold medications.  History of PE: Continue Xarelto  Code Status: Full Code Family Communication: N/A   Nolon Nations  APRN, MSN, FNP-C Patient Care Center [facility] [address] [place], [place] [address] [phone]  If 7PM-7AM, please contact night-coverage.  12/10/2022, 9:22 AM  LOS: 1 day

## 2022-12-11 DIAGNOSIS — D5700 Hb-SS disease with crisis, unspecified: Secondary | ICD-10-CM | POA: Diagnosis not present

## 2022-12-11 LAB — BPAM RBC
Blood Product Expiration Date: 202408022359
ISSUE DATE / TIME: 202406251230
Unit Type and Rh: 6200

## 2022-12-11 LAB — CBC
HCT: 22.9 % — ABNORMAL LOW (ref 36.0–46.0)
Hemoglobin: 7.9 g/dL — ABNORMAL LOW (ref 12.0–15.0)
MCH: 30.4 pg (ref 26.0–34.0)
MCHC: 34.5 g/dL (ref 30.0–36.0)
MCV: 88.1 fL (ref 80.0–100.0)
Platelets: 409 10*3/uL — ABNORMAL HIGH (ref 150–400)
RBC: 2.6 MIL/uL — ABNORMAL LOW (ref 3.87–5.11)
RDW: 18.2 % — ABNORMAL HIGH (ref 11.5–15.5)
WBC: 10.2 10*3/uL (ref 4.0–10.5)
nRBC: 0.4 % — ABNORMAL HIGH (ref 0.0–0.2)

## 2022-12-11 LAB — TYPE AND SCREEN
ABO/RH(D): A POS
Unit division: 0

## 2022-12-11 MED ORDER — OXYCODONE HCL 5 MG PO TABS
10.0000 mg | ORAL_TABLET | ORAL | Status: DC | PRN
  Administered 2022-12-11 – 2022-12-12 (×2): 10 mg via ORAL
  Filled 2022-12-11 (×2): qty 2

## 2022-12-11 MED ORDER — HYDROMORPHONE 1 MG/ML IV SOLN
INTRAVENOUS | Status: DC
  Administered 2022-12-11: 1.5 mg via INTRAVENOUS
  Administered 2022-12-11: 2 mg via INTRAVENOUS
  Administered 2022-12-12: 1.5 mg via INTRAVENOUS
  Administered 2022-12-12: 1 mg via INTRAVENOUS
  Administered 2022-12-12: 2 mg via INTRAVENOUS
  Administered 2022-12-12: 1 mg via INTRAVENOUS
  Administered 2022-12-12: 1.5 mg via INTRAVENOUS
  Administered 2022-12-12: 1 mg via INTRAVENOUS
  Administered 2022-12-13: 2.3 mg via INTRAVENOUS
  Administered 2022-12-13: 4.5 mg via INTRAVENOUS
  Administered 2022-12-13: 30 mg via INTRAVENOUS
  Filled 2022-12-11: qty 30

## 2022-12-11 MED ORDER — MIRTAZAPINE 30 MG PO TABS
45.0000 mg | ORAL_TABLET | Freq: Every day | ORAL | Status: DC
  Administered 2022-12-11 – 2022-12-12 (×2): 45 mg via ORAL
  Filled 2022-12-11 (×2): qty 1

## 2022-12-11 NOTE — Progress Notes (Signed)
Subjective: Molly Gutierrez is a 42 year old female with a medical history significant for sickle cell disease, chronic pain syndrome, opiate dependence and tolerance, mild intermittent asthma, and anemia of chronic disease was admitted for sickle cell pain crisis.  Today, hemoglobin decreased at 6.5, which is below patient's baseline.  Patient denies headache, dizziness, shortness of breath, nausea, vomiting, or diarrhea.   Objective:  Vital signs in last 24 hours:  Vitals:   12/11/22 0711 12/11/22 0713 12/11/22 0742 12/11/22 1023  BP:    104/74  Pulse:    72  Resp: 12 16  18   Temp:    98.7 F (37.1 C)  TempSrc:    Oral  SpO2:   97% 99%  Weight:      Height:        Intake/Output from previous day:   Intake/Output Summary (Last 24 hours) at 12/11/2022 1422 Last data filed at 12/10/2022 1530 Gross per 24 hour  Intake 315 ml  Output --  Net 315 ml    Physical Exam: General: Alert, awake, oriented x3, in no acute distress.  HEENT: [place]/AT PEERL, EOMI Neck: Trachea midline,  no masses, no thyromegal,y no JVD, no carotid bruit OROPHARYNX:  Moist, No exudate/ erythema/lesions.  Heart: Regular rate and rhythm, without murmurs, rubs, gallops, PMI non-displaced, no heaves or thrills on palpation.  Lungs: Clear to auscultation, no wheezing or rhonchi noted. No increased vocal fremitus resonant to percussion  Abdomen: Soft, nontender, nondistended, positive bowel sounds, no masses no hepatosplenomegaly noted..  Neuro: No focal neurological deficits noted cranial nerves II through XII grossly intact. DTRs 2+ bilaterally upper and lower extremities. Strength 5 out of 5 in bilateral upper and lower extremities. Musculoskeletal: No warm swelling or erythema around joints, no spinal tenderness noted. Psychiatric: Patient alert and oriented x3, good insight and cognition, good recent to remote recall. Lymph node survey: No cervical axillary or inguinal lymphadenopathy noted.  Lab  Results:  Basic Metabolic Panel:    Component Value Date/Time   NA 136 05/23/2022 1421   K 3.6 05/23/2022 1421   CL 103 05/23/2022 1421   CO2 20 05/23/2022 1421   BUN 9 05/23/2022 1421   CREATININE 0.61 05/23/2022 1421   GLUCOSE 118 (H) 05/23/2022 1421   GLUCOSE 112 (H) 04/04/2022 0419   CALCIUM 9.2 05/23/2022 1421   CBC:    Component Value Date/Time   WBC 10.2 12/11/2022 0515   HGB 7.9 (L) 12/11/2022 0515   HGB 7.8 (L) 05/23/2022 1421   HCT 22.9 (L) 12/11/2022 0515   HCT 23.0 (L) 05/23/2022 1421   PLT 409 (H) 12/11/2022 0515   PLT 654 (H) 05/23/2022 1421   MCV 88.1 12/11/2022 0515   MCV 89 05/23/2022 1421   NEUTROABS 9.9 (H) 05/23/2022 1421   LYMPHSABS 3.3 (H) 05/23/2022 1421   MONOABS 1.5 (H) 04/02/2022 1051   EOSABS 0.8 (H) 05/23/2022 1421   BASOSABS 0.2 05/23/2022 1421    No results found for this or any previous visit (from the past 240 hour(s)).  Studies/Results: No results found.  Medications: Scheduled Meds:  Chlorhexidine Gluconate Cloth  6 each Topical Daily   cholecalciferol  1,000 Units Oral Daily   folic acid  1 mg Oral Daily   HYDROmorphone   Intravenous Q4H   ketorolac  15 mg Intravenous Q6H   mirtazapine  45 mg Oral QHS   mometasone-formoterol  2 puff Inhalation BID   rivaroxaban  20 mg Oral Q supper   senna-docusate  1 tablet  Oral BID   voxelotor  1,500 mg Oral Daily   Continuous Infusions:  sodium chloride 10 mL/hr at 12/10/22 0924   PRN Meds:.albuterol, diphenhydrAMINE, heparin lock flush, naloxone **AND** sodium chloride flush, ondansetron (ZOFRAN) IV, oxyCODONE, polyethylene glycol, sodium chloride flush  Consultants: none  Procedures: none  Antibiotics: none  Assessment/Plan: Principal Problem:   Sickle cell pain crisis (HCC) Active Problems:   History of pulmonary embolism   Asthma   Opioid dependence (HCC)   Mild intermittent asthma without complication   Sickle cell disease with pain crisis:  Weaning IV dilaudid  PCA Oxycodone 10 mg every 4 hours as needed Toradol 15 mg IV every 6 hours.  Monitor vital signs closely, reevaluate pain scale regularly, and supplemental oxygen as needed  Anemia of chronic disease: Stable and consistent with baseline.   Follow labs in AM.  Leukocytosis: Improving.  No antibiotics warranted at this time.  Continue to monitor closely.  Chronic pain syndrome: Continue home medications  Mild intermittent asthma: Stable. Home medications as needed  History of PE: Continue Xarelto  Code Status: Full Code Family Communication: N/A   Nolon Nations  APRN, MSN, FNP-C Patient Care Center [facility] [address] [place], [place] [address] [phone]  If 7PM-7AM, please contact night-coverage.  12/11/2022, 2:22 PM  LOS: 2 days

## 2022-12-12 DIAGNOSIS — D5700 Hb-SS disease with crisis, unspecified: Secondary | ICD-10-CM | POA: Diagnosis not present

## 2022-12-12 LAB — BASIC METABOLIC PANEL
Anion gap: 5 (ref 5–15)
BUN: 13 mg/dL (ref 6–20)
CO2: 25 mmol/L (ref 22–32)
Calcium: 8.1 mg/dL — ABNORMAL LOW (ref 8.9–10.3)
Chloride: 107 mmol/L (ref 98–111)
Creatinine, Ser: 0.62 mg/dL (ref 0.44–1.00)
GFR, Estimated: >60 mL/min (ref >60)
Glucose, Bld: 108 mg/dL — ABNORMAL HIGH (ref 70–99)
Potassium: 4 mmol/L (ref 3.5–5.1)
Sodium: 137 mmol/L (ref 135–145)

## 2022-12-12 LAB — CBC
HCT: 22.7 % — ABNORMAL LOW (ref 36.0–46.0)
Hemoglobin: 7.7 g/dL — ABNORMAL LOW (ref 12.0–15.0)
MCH: 30.3 pg (ref 26.0–34.0)
MCHC: 33.9 g/dL (ref 30.0–36.0)
MCV: 89.4 fL (ref 80.0–100.0)
Platelets: 405 10*3/uL — ABNORMAL HIGH (ref 150–400)
RBC: 2.54 MIL/uL — ABNORMAL LOW (ref 3.87–5.11)
RDW: 18.5 % — ABNORMAL HIGH (ref 11.5–15.5)
WBC: 10.7 10*3/uL — ABNORMAL HIGH (ref 4.0–10.5)
nRBC: 0.2 % (ref 0.0–0.2)

## 2022-12-12 MED ORDER — HYDROMORPHONE HCL 1 MG/ML IJ SOLN: 1.0000 mg | Freq: Four times a day (QID) | INTRAMUSCULAR | Status: DC | PRN

## 2022-12-12 MED ORDER — HYDROMORPHONE HCL 4 MG PO TABS
4.0000 mg | ORAL_TABLET | ORAL | Status: DC
  Administered 2022-12-12 – 2022-12-13 (×3): 4 mg via ORAL
  Filled 2022-12-12 (×3): qty 1

## 2022-12-12 MED ORDER — ACETAMINOPHEN 500 MG PO TABS: 1000.0000 mg | ORAL_TABLET | Freq: Three times a day (TID) | ORAL | Status: DC | PRN

## 2022-12-12 NOTE — Progress Notes (Signed)
Subjective: Molly Gutierrez is a 42 year old female with a medical history significant for sickle cell disease, chronic pain syndrome, opiate dependence and tolerance, mild intermittent asthma, and anemia of chronic disease was admitted for sickle cell pain crisis.  Patient continues to complain of pain primarily to chest, low back, and lower extremities.  She rates pain as 6/10.   Patient denies headache, dizziness, shortness of breath, nausea, vomiting, or diarrhea.   Objective:  Vital signs in last 24 hours:  Vitals:   12/12/22 0605 12/12/22 0731 12/12/22 1019 12/12/22 1126  BP: 96/72  94/60   Pulse: 73  69   Resp: 14 12 14 12   Temp: 98.7 F (37.1 C)  99 F (37.2 C)   TempSrc: Oral  Oral   SpO2: 98% 99% 98%   Weight:      Height:        Intake/Output from previous day:  No intake or output data in the 24 hours ending 12/12/22 1218   Physical Exam: General: Alert, awake, oriented x3, in no acute distress.  HEENT: [place]/AT PEERL, EOMI Neck: Trachea midline,  no masses, no thyromegal,y no JVD, no carotid bruit OROPHARYNX:  Moist, No exudate/ erythema/lesions.  Heart: Regular rate and rhythm, without murmurs, rubs, gallops, PMI non-displaced, no heaves or thrills on palpation.  Lungs: Clear to auscultation, no wheezing or rhonchi noted. No increased vocal fremitus resonant to percussion  Abdomen: Soft, nontender, nondistended, positive bowel sounds, no masses no hepatosplenomegaly noted..  Neuro: No focal neurological deficits noted cranial nerves II through XII grossly intact. DTRs 2+ bilaterally upper and lower extremities. Strength 5 out of 5 in bilateral upper and lower extremities. Musculoskeletal: No warm swelling or erythema around joints, no spinal tenderness noted. Psychiatric: Patient alert and oriented x3, good insight and cognition, good recent to remote recall. Lymph node survey: No cervical axillary or inguinal lymphadenopathy noted.  Lab Results:  Basic  Metabolic Panel:    Component Value Date/Time   NA 137 12/12/2022 0500   NA 136 05/23/2022 1421   K 4.0 12/12/2022 0500   CL 107 12/12/2022 0500   CO2 25 12/12/2022 0500   BUN 13 12/12/2022 0500   BUN 9 05/23/2022 1421   CREATININE 0.62 12/12/2022 0500   GLUCOSE 108 (H) 12/12/2022 0500   CALCIUM 8.1 (L) 12/12/2022 0500   CBC:    Component Value Date/Time   WBC 10.7 (H) 12/12/2022 0500   HGB 7.7 (L) 12/12/2022 0500   HGB 7.8 (L) 05/23/2022 1421   HCT 22.7 (L) 12/12/2022 0500   HCT 23.0 (L) 05/23/2022 1421   PLT 405 (H) 12/12/2022 0500   PLT 654 (H) 05/23/2022 1421   MCV 89.4 12/12/2022 0500   MCV 89 05/23/2022 1421   NEUTROABS 9.9 (H) 05/23/2022 1421   LYMPHSABS 3.3 (H) 05/23/2022 1421   MONOABS 1.5 (H) 04/02/2022 1051   EOSABS 0.8 (H) 05/23/2022 1421   BASOSABS 0.2 05/23/2022 1421    No results found for this or any previous visit (from the past 240 hour(s)).  Studies/Results: No results found.  Medications: Scheduled Meds:  Chlorhexidine Gluconate Cloth  6 each Topical Daily   cholecalciferol  1,000 Units Oral Daily   folic acid  1 mg Oral Daily   HYDROmorphone   Intravenous Q4H   HYDROmorphone  4 mg Oral Q4H while awake   ketorolac  15 mg Intravenous Q6H   mirtazapine  45 mg Oral QHS   mometasone-formoterol  2 puff Inhalation BID   rivaroxaban  20 mg Oral Q supper   senna-docusate  1 tablet Oral BID   voxelotor  1,500 mg Oral Daily   Continuous Infusions:  sodium chloride 10 mL/hr at 12/10/22 0924   PRN Meds:.acetaminophen, albuterol, diphenhydrAMINE, heparin lock flush, HYDROmorphone (DILAUDID) injection, naloxone **AND** sodium chloride flush, ondansetron (ZOFRAN) IV, polyethylene glycol, sodium chloride flush  Consultants: none  Procedures: none  Antibiotics: none  Assessment/Plan: Principal Problem:   Sickle cell pain crisis (HCC) Active Problems:   History of pulmonary embolism   Asthma   Opioid dependence (HCC)   Mild intermittent  asthma without complication   Sickle cell disease with pain crisis:  Discontinue IV Dilaudid PCA and oxycodone Restart home medications.  Dilaudid 4 mg every 4 hours while awake. Dilaudid 1 mg every 6 hours as needed for severe breakthrough pain Continue Toradol 15 mg IV every 6 hours Monitor vital signs closely, reevaluate pain scale regularly, and supplemental oxygen as needed  Anemia of chronic disease: Stable and consistent with baseline.   Follow labs in AM.  Leukocytosis: Stable.  No antibiotics warranted at this time.  Continue to monitor closely.  Chronic pain syndrome: Continue home medications  Mild intermittent asthma: Stable. Home medications as needed  History of PE: Continue Xarelto  Code Status: Full Code Family Communication: N/A   Nolon Nations  APRN, MSN, FNP-C [facility] [address] [place], [place] [address] [phone]  If 7PM-7AM, please contact night-coverage.  12/12/2022, 12:18 PM  LOS: 3 days

## 2022-12-13 ENCOUNTER — Other Ambulatory Visit (HOSPITAL_COMMUNITY): Payer: Self-pay

## 2022-12-13 DIAGNOSIS — D5700 Hb-SS disease with crisis, unspecified: Secondary | ICD-10-CM | POA: Diagnosis not present

## 2022-12-13 MED ORDER — HEPARIN SOD (PORK) LOCK FLUSH 100 UNIT/ML IV SOLN
500.0000 [IU] | Freq: Once | INTRAVENOUS | Status: AC
  Administered 2022-12-13: 500 [IU] via INTRAVENOUS
  Filled 2022-12-13: qty 5

## 2022-12-13 MED ORDER — HYDROMORPHONE HCL 4 MG PO TABS
4.0000 mg | ORAL_TABLET | Freq: Four times a day (QID) | ORAL | 0 refills | Status: DC | PRN
Start: 2022-12-13 — End: 2022-12-18
  Filled 2022-12-13: qty 60, 15d supply, fill #0
  Filled 2022-12-13: qty 25, 7d supply, fill #0
  Filled 2022-12-16 – 2022-12-17 (×2): qty 25, 7d supply, fill #1

## 2022-12-13 NOTE — Care Management Important Message (Signed)
Important Message  Patient Details IM Letter given to the Patient. Name: Molly Gutierrez MRN: [id number] Date of Birth: 1981/05/15   Medicare Important Message Given:  Yes     [name] 12/13/2022, 8:41 AM

## 2022-12-13 NOTE — Plan of Care (Addendum)
Patient alert and oriented X4, Hydromorphone delivered by pharmacy, patient education completed, chest port de accessed, gauze and Tegaderm in place, patient awaiting ride in discharge lounge. No further needs at this time. Problem: Education: Goal: Knowledge of vaso-occlusive preventative measures will improve Outcome: Completed/Met Goal: Awareness of infection prevention will improve Outcome: Completed/Met Goal: Awareness of signs and symptoms of anemia will improve Outcome: Completed/Met Goal: Long-term complications will improve Outcome: Completed/Met   Problem: Self-Care: Goal: Ability to incorporate actions that prevent/reduce pain crisis will improve Outcome: Completed/Met   Problem: Bowel/Gastric: Goal: Gut motility will be maintained Outcome: Completed/Met   Problem: Tissue Perfusion: Goal: Complications related to inadequate tissue perfusion will be avoided or minimized Outcome: Completed/Met   Problem: Respiratory: Goal: Pulmonary complications will be avoided or minimized Outcome: Completed/Met Goal: Acute Chest Syndrome will be identified early to prevent complications Outcome: Completed/Met   Problem: Fluid Volume: Goal: Ability to maintain a balanced intake and output will improve Outcome: Completed/Met   Problem: Sensory: Goal: Pain level will decrease with appropriate interventions Outcome: Completed/Met   Problem: Health Behavior: Goal: Postive changes in compliance with treatment and prescription regimens will improve Outcome: Completed/Met   Problem: Education: Goal: Knowledge of General Education information will improve Description: Including pain rating scale, medication(s)/side effects and non-pharmacologic comfort measures Outcome: Completed/Met   Problem: Health Behavior/Discharge Planning: Goal: Ability to manage health-related needs will improve Outcome: Completed/Met   Problem: Clinical Measurements: Goal: Ability to maintain clinical  measurements within normal limits will improve Outcome: Completed/Met Goal: Will remain free from infection Outcome: Completed/Met Goal: Diagnostic test results will improve Outcome: Completed/Met Goal: Respiratory complications will improve Outcome: Completed/Met Goal: Cardiovascular complication will be avoided Outcome: Completed/Met   Problem: Activity: Goal: Risk for activity intolerance will decrease Outcome: Completed/Met   Problem: Nutrition: Goal: Adequate nutrition will be maintained Outcome: Completed/Met   Problem: Coping: Goal: Level of anxiety will decrease Outcome: Completed/Met   Problem: Elimination: Goal: Will not experience complications related to bowel motility Outcome: Completed/Met Goal: Will not experience complications related to urinary retention Outcome: Completed/Met   Problem: Pain Managment: Goal: General experience of comfort will improve Outcome: Completed/Met   Problem: Safety: Goal: Ability to remain free from injury will improve Outcome: Completed/Met   Problem: Skin Integrity: Goal: Risk for impaired skin integrity will decrease Outcome: Completed/Met

## 2022-12-13 NOTE — Discharge Summary (Signed)
Physician Discharge Summary  [facility] UJW:[id number] DOB: 1981-06-07 DOA: 12/09/2022  PCP: [name], NP  Admit date: 12/09/2022  Discharge date: 12/13/2022  Discharge Diagnoses:  Principal Problem:   Sickle cell pain crisis [facility]) Active Problems:   History of pulmonary embolism   Asthma   Opioid dependence (HCC)   Mild intermittent asthma without complication   Discharge Condition: Stable  Disposition:   Follow-up Information     [name], NP Follow up.   Specialties: Pulmonary Disease, Endocrinology Contact information: [address] [place] [address] [phone]                Pt is discharged home in good condition and is to follow up with [name], NP this week to have labs evaluated. Shavaughn [place] is instructed to increase activity slowly and balance with rest for the next few days, and use prescribed medication to complete treatment of pain  Diet: Regular Wt Readings from Last 3 Encounters:  12/09/22 51.2 kg  10/16/22 49.2 kg  07/18/22 51.7 kg    History of present illness:  Molly Gutierrez  is a 42 y.o. female is a 42 year old female with a medical history significant for sickle cell disease, chronic pain syndrome, opiate dependence and tolerance, history of PE on Xarelto, and mild intermittent asthma, and anemia of chronic disease presented to sickle cell day infusion clinic for management of her sickle cell crisis.  Patient states that 2 days prior, she awakened with allover body pain that was unrelieved by her home medication.  Patient was treated and evaluated in the emergency department on yesterday for this problem with very little relief.  She has also been taking hydromorphone at home for pain control.  She has not identified any inciting factors concerning her crisis.  She currently rates pain as 9/10, constant, and occasionally sharp.  Patient denies fever, chills, shortness of breath, or  dizziness.  No urinary symptoms, nausea, vomiting, or diarrhea.  No sick contacts or recent travel.   Sickle cell day clinic course: While at sickle cell clinic, patient's vital signs remained stable however:BP 96/66 (BP Location: Left Arm)   Pulse 87   Temp 99 F (37.2 C) (Oral)   Resp 12   LMP 12/09/2022   SpO2 94%  Complete blood count notable for WBCs 17.3, hemoglobin 7.4 g/dL, and platelets 295,284.  Basic metabolic panel unremarkable.  Reticulocytes, 8.6%.  Chest x-ray pending.  Pain persists despite IV Dilaudid PCA, IV fluids, and IV Toradol.  Patient will be admitted to [facility] for further management of her sickle cell pain crisis.    Hospital Course:  Sickle cell disease with pain crisis:  Patient was admitted for sickle cell pain crisis and managed appropriately with IVF, IV Dilaudid via PCA and IV Toradol, as well as other adjunct therapies per sickle cell pain management protocols. Pain intensity is 6/10 and patient will be discharged home on today. Dilaudid 4 mg every 6 hours as needed # 60 was sent to patient's pharmacy. Reviewed PDMP substance reporting system prior to prescribing opiate medications. No inconsistencies noted.  Patient has a post hospital follow up scheduled for 12/16/2022.   Anemia of chronic disease:  Hemoglobin decreased below baseline at 6.5.  Patient transfused 1 unit PRBCs.  Prior to discharge, hemoglobin 7.7 g/dL.  Patient will follow-up with PCP on 12/16/2022 to repeat CBC with differential and CMP.  Patient is alert, oriented, and ambulating without assistance.  Discharged home  in a hemodynamically stable condition.    Starlee will follow-up with PCP within 1 week of this discharge. Lace was counseled extensively about nonpharmacologic means of pain management, patient verbalized understanding and was appreciative of  the care received during this admission.   We discussed the need for good hydration, monitoring of hydration status,  avoidance of heat, cold, stress, and infection triggers. Patient was reminded of the need to seek medical attention immediately if any symptom of bleeding, anemia, or infection occurs.  Discharge Exam: Vitals:   12/13/22 0613 12/13/22 0744  BP: (!) 91/58   Pulse: 76   Resp: 14 11  Temp: 99.1 F (37.3 C)   SpO2: 95% 94%   Vitals:   12/13/22 0153 12/13/22 0324 12/13/22 0613 12/13/22 0744  BP: 103/66  (!) 91/58   Pulse: 76  76   Resp: 14 14 14 11   Temp: 98.8 F (37.1 C)  99.1 F (37.3 C)   TempSrc: Oral  Oral   SpO2: 96% 92% 95% 94%  Weight:      Height:        General appearance : Awake, alert, not in any distress. Speech Clear. Not toxic looking HEENT: Atraumatic and Normocephalic, pupils equally reactive to light and accomodation Neck: Supple, no JVD. No cervical lymphadenopathy.  Chest: Good air entry bilaterally, no added sounds  CVS: S1 S2 regular, no murmurs.  Abdomen: Bowel sounds present, Non tender and not distended with no gaurding, rigidity or rebound. Extremities: B/L Lower Ext shows no edema, both legs are warm to touch Neurology: Awake alert, and oriented X 3, CN II-XII intact, Non focal Skin: No Rash  Discharge Instructions  Discharge Instructions     Discharge patient   Complete by: As directed    Discharge disposition: 01-Home or Self Care   Discharge patient date: 12/13/2022      Allergies as of 12/13/2022       Reactions   Cefaclor Hives, Swelling   Hydroxyurea Palpitations, Other (See Comments)   Lowers "blood levels" and heart rate (causes HYPOtension); "it messes me up, it drops my levels and stuff"   Omeprazole Other (See Comments)   Causes "sharp pains in the stomach"   Ketamine Palpitations, Other (See Comments)   "Pt states she has had previous reaction to ketamine. States she becomes flushed, heart races, dizzy, and feels like she is going to pass out."        Medication List     TAKE these medications    acetaminophen 500 MG  tablet Commonly known as: TYLENOL Take 500 mg by mouth every 6 (six) hours as needed for mild pain or headache (or cramps).   albuterol 108 (90 Base) MCG/ACT inhaler Commonly known as: VENTOLIN HFA Inhale 2 puffs into the lungs every 6 (six) hours as needed for wheezing or shortness of breath.   diphenhydrAMINE 25 MG tablet Commonly known as: BENADRYL Take 25 mg by mouth every 4 (four) hours as needed for itching (with hydromorphone dose).   fluticasone 50 MCG/ACT nasal spray Commonly known as: FLONASE Place 1 spray into both nostrils daily as needed for allergies or rhinitis.   folic acid 1 MG tablet Commonly known as: FOLVITE Take 1 mg by mouth daily.   HYDROmorphone 4 MG tablet Commonly known as: DILAUDID Take 1 tablet (4 mg total) by mouth every 6 (six) hours as needed (for pain).   Jadenu 360 MG Tabs Generic drug: Deferasirox Take 1,080 mg by mouth daily.   mirtazapine 45  MG tablet Commonly known as: REMERON Take 1 tablet (45 mg total) by mouth at bedtime. What changed: Another medication with the same name was removed. Continue taking this medication, and follow the directions you see here.   mometasone-formoterol 100-5 MCG/ACT Aero Commonly known as: DULERA Inhale 2 puffs into the lungs daily as needed for wheezing or shortness of breath.   naloxone 4 MG/0.1ML Liqd nasal spray kit Commonly known as: NARCAN Place 0.4 mg into the nose once as needed (opioid overdose).   Oxbryta 500 MG Tabs tablet Generic drug: voxelotor Take 1,500 mg by mouth daily.   polyethylene glycol 17 g packet Commonly known as: MIRALAX / GLYCOLAX Take 17 g by mouth daily as needed for mild constipation (MIX AS DIRECTED AND DRINK).   promethazine 25 MG tablet Commonly known as: PHENERGAN Take 0.5-1 tablets (12.5-25 mg total) by mouth every 6 (six) hours as needed for nausea or vomiting. What changed:  how much to take when to take this reasons to take this   VITAMIN B-12 PO Take 1  tablet by mouth at bedtime.   Vitamin D (Ergocalciferol) 1.25 MG (50000 UNIT) Caps capsule Commonly known as: DRISDOL Take 1 capsule by mouth once a week.   VITAMIN D3 PO Take 1 capsule by mouth at bedtime.   rivaroxaban 20 MG Tabs tablet Commonly known as: XARELTO Take 20 mg by mouth every evening. What changed: Another medication with the same name was changed. Make sure you understand how and when to take each.   Xarelto 20 MG Tabs tablet Generic drug: rivaroxaban Take 1 tablet (20 mg total) by mouth daily with supper. What changed: when to take this        The results of significant diagnostics from this hospitalization (including imaging, microbiology, ancillary and laboratory) are listed below for reference.    Significant Diagnostic Studies: No results found.  Microbiology: No results found for this or any previous visit (from the past 240 hour(s)).   Labs: Basic Metabolic Panel: Recent Labs  Lab 12/12/22 0500  NA 137  K 4.0  CL 107  CO2 25  GLUCOSE 108*  BUN 13  CREATININE 0.62  CALCIUM 8.1*   Liver Function Tests: No results for input(s): "AST", "ALT", "ALKPHOS", "BILITOT", "PROT", "ALBUMIN" in the last 168 hours. No results for input(s): "LIPASE", "AMYLASE" in the last 168 hours. No results for input(s): "AMMONIA" in the last 168 hours. CBC: Recent Labs  Lab 12/09/22 1115 12/10/22 0516 12/10/22 1822 12/11/22 0515 12/12/22 0500  WBC 17.3* 12.7*  --  10.2 10.7*  HGB 7.4* 6.5* 7.7* 7.9* 7.7*  HCT 21.0* 18.2* 22.7* 22.9* 22.7*  MCV 89.4 87.9  --  88.1 89.4  PLT 498* 418*  --  409* 405*   Cardiac Enzymes: No results for input(s): "CKTOTAL", "CKMB", "CKMBINDEX", "TROPONINI" in the last 168 hours. BNP: Invalid input(s): "POCBNP" CBG: No results for input(s): "GLUCAP" in the last 168 hours.  Time coordinating discharge: 30 minutes  Signed:  Nolon Nations  APRN, MSN, FNP-C Patient Care [facility] [address]  [place], [place] [address] [phone]  [facility] 12/13/2022, 9:52 AM

## 2022-12-13 NOTE — TOC Transition Note (Signed)
Transition of Care [facility]) - CM/SW Discharge Note   Patient Details  Name: Molly Gutierrez MRN: [id number] Date of Birth: [birth year]  Transition of Care [facility]) CM/SW Contact:  Adrian Prows, RN Phone Number: 12/13/2022, 10:24 AM   Clinical Narrative:    DC orders received; no TOC needs.   Final next level of care: Home/Self Care Barriers to Discharge: No Barriers Identified   Patient Goals and CMS Choice      Discharge Placement                         Discharge Plan and Services Additional resources added to the After Visit Summary for                                       Social Determinants of Health (SDOH) Interventions SDOH Screenings   Food Insecurity: No Food Insecurity (12/09/2022)  Housing: Patient Declined (12/09/2022)  Transportation Needs: No Transportation Needs (12/09/2022)  Utilities: Not At Risk (12/09/2022)  Alcohol Screen: Low Risk  (03/12/2021)  Depression (PHQ2-9): Low Risk  (10/16/2022)  Financial Resource Strain: Low Risk  (07/18/2022)  Physical Activity: Inactive (07/18/2022)  Social Connections: Moderately Integrated (07/18/2022)  Stress: No Stress Concern Present (07/18/2022)  Tobacco Use: Low Risk  (12/09/2022)     Readmission Risk Interventions    04/03/2022   11:42 AM 10/07/2021    2:43 PM 11/08/2020   11:02 AM  Readmission Risk Prevention Plan  Transportation Screening Complete Complete   Medication Review [employer]) Complete Complete   PCP or Specialist appointment within 3-5 days of discharge Complete Complete   HRI or Home Care Consult Complete    SW Recovery Care/Counseling Consult Complete Complete   Palliative Care Screening Not Applicable Not Applicable   Skilled Nursing Facility Not Applicable Not Applicable      Information is confidential and restricted. Go to Review Flowsheets to unlock data.

## 2022-12-16 ENCOUNTER — Other Ambulatory Visit (HOSPITAL_COMMUNITY): Payer: Self-pay

## 2022-12-16 ENCOUNTER — Telehealth: Payer: Self-pay

## 2022-12-16 ENCOUNTER — Inpatient Hospital Stay: Payer: Self-pay | Admitting: Nurse Practitioner

## 2022-12-16 NOTE — Transitions of Care (Post Inpatient/ED Visit) (Signed)
12/16/2022  Name: Molly Gutierrez MRN: [id number] DOB: 06-Mar-1981  Today's TOC FU Call Status: Today's TOC FU Call Status:: Successful TOC FU Call Competed TOC FU Call Complete Date: 12/16/22  Transition Care Management Follow-up Telephone Call Date of Discharge: 12/13/22 Discharge Facility: [facility]) Type of Discharge: Inpatient Admission Primary Inpatient Discharge Diagnosis:: Sickle cell pain crisis How have you been since you were released from the hospital?: Same Any questions or concerns?: No  Items Reviewed: Did you receive and understand the discharge instructions provided?: Yes Medications obtained,verified, and reconciled?: Yes (Medications Reviewed) Any new allergies since your discharge?: No Dietary orders reviewed?: Yes Do you have support at home?: Yes  Medications Reviewed Today: Medications Reviewed Today     Reviewed by Merleen Nicely, LPN (Licensed Practical Nurse) on 12/16/22 at 1245  Med List Status: <None>   Medication Order Taking? Sig Documenting Provider Last Dose Status Informant  acetaminophen (TYLENOL) 500 MG tablet [id number] Yes Take 500 mg by mouth every 6 (six) hours as needed for mild pain or headache (or cramps). [provider] Taking Active Self  albuterol (VENTOLIN HFA) 108 (90 Base) MCG/ACT inhaler [id number] Yes Inhale 2 puffs into the lungs every 6 (six) hours as needed for wheezing or shortness of breath. [name], NP Taking Active Self  Cholecalciferol (VITAMIN D3 PO) [id number] Yes Take 1 capsule by mouth at bedtime. [provider] Taking Active Self  Cyanocobalamin (VITAMIN B-12 PO) [id number] Yes Take 1 tablet by mouth at bedtime. [provider] Taking Active Self  Deferasirox (JADENU) 360 MG TABS [id number] Yes Take 1,080 mg by mouth daily. [provider] Taking Active Self  diphenhydrAMINE (BENADRYL) 25 MG tablet [id number] Yes Take 25 mg by mouth every 4 (four) hours as needed  for itching (with hydromorphone dose). [provider] Taking Active Self           Med Note [name], REGEENA   Thu Oct 05, 2020  5:11 PM)    fluticasone (FLONASE) 50 MCG/ACT nasal spray [id number] No Place 1 spray into both nostrils daily as needed for allergies or rhinitis.  Patient not taking: Reported on 08/19/2022   [name], NP Not Taking Expired 04/02/22 2359 Self  folic acid (FOLVITE) 1 MG tablet [id number] Yes Take 1 mg by mouth daily. [provider] Taking Active Self  HYDROmorphone (DILAUDID) 4 MG tablet [id number] Yes Take 1 tablet (4 mg total) by mouth every 6 (six) hours as needed (for pain). Massie Maroon, FNP Taking Active   mirtazapine (REMERON) 45 MG tablet [id number] Yes Take 1 tablet (45 mg total) by mouth at bedtime. [name], NP Taking Active Self  mometasone-formoterol (DULERA) 100-5 MCG/ACT [name] [id number] Yes Inhale 2 puffs into the lungs daily as needed for wheezing or shortness of breath. [name], NP Taking Active Self  naloxone [facility]) nasal spray 4 mg/0.1 mL [id number] Yes Place 0.4 mg into the nose once as needed (opioid overdose). [provider] Taking Active Self           Med Note Laney Potash Dec 09, 2022 10:32 PM)    OXBRYTA 500 MG TABS tablet [id number] Yes Take 1,500 mg by mouth daily. [provider] Taking Active Self  polyethylene glycol (MIRALAX / GLYCOLAX) 17 g packet [id number] Yes Take 17 g by mouth daily as needed for mild constipation (MIX AS DIRECTED AND DRINK). [provider] Taking Active Self  Med Note [name], REGEENA   Thu Oct 05, 2020  5:16 PM)    promethazine (PHENERGAN) 25 MG tablet [id number] Yes Take 0.5-1 tablets (12.5-25 mg total) by mouth every 6 (six) hours as needed for nausea or vomiting.  Patient taking differently: Take 25 mg by mouth every 4 (four) hours as needed for nausea or vomiting (with hydromorphone dose).   [name], NP  Taking Active Self  rivaroxaban (XARELTO) 20 MG TABS tablet [id number] Yes Take 20 mg by mouth every evening. [provider] Taking Active Self  Vitamin D, Ergocalciferol, (DRISDOL) 1.25 MG (50000 UNIT) CAPS capsule [id number] Yes Take 1 capsule by mouth once a week. [provider] Taking Active Self  XARELTO 20 MG TABS tablet [id number] Yes Take 1 tablet (20 mg total) by mouth daily with supper.  Patient taking differently: Take 20 mg by mouth at bedtime.   [name], NP Taking Active Self  Med List Note Salvatore Marvel, CPhT 04/02/22 1640): The "k" is in the patient's first name is SILENT              Home Care and Equipment/Supplies: Were Home Health Services Ordered?: No Any new equipment or medical supplies ordered?: No  Functional Questionnaire: Do you need assistance with bathing/showering or dressing?: No Do you need assistance with meal preparation?: No Do you need assistance with eating?: No Do you have difficulty maintaining continence: No Do you need assistance with getting out of bed/getting out of a chair/moving?: No Do you have difficulty managing or taking your medications?: No  Follow up appointments reviewed: PCP Follow-up appointment confirmed?: Yes Date of PCP follow-up appointment?: 12/23/22 Follow-up Provider: [facility] Follow-up appointment confirmed?: No Do you need transportation to your follow-up appointment?: No Do you understand care options if your condition(s) worsen?: Yes-patient verbalized understanding    SIGNATURE  Woodfin Ganja LPN [facility] Nurse Health Advisor Direct Dial [phone]

## 2022-12-16 NOTE — Telephone Encounter (Signed)
Pt called and said that [facility] didn't have enough(Out of stock)  to give her .on her hydromorphone.  They gv her 25 pills. She said that will not last til Friday.  Will you all be able to send her the rest (35PIlls ) to another pharmacy?    [facility]  [address] [place]

## 2022-12-17 ENCOUNTER — Other Ambulatory Visit (HOSPITAL_COMMUNITY): Payer: Self-pay

## 2022-12-17 ENCOUNTER — Other Ambulatory Visit: Payer: Self-pay

## 2022-12-18 ENCOUNTER — Other Ambulatory Visit (HOSPITAL_COMMUNITY): Payer: Self-pay

## 2022-12-18 ENCOUNTER — Other Ambulatory Visit: Payer: Self-pay | Admitting: Nurse Practitioner

## 2022-12-18 DIAGNOSIS — M79605 Pain in left leg: Secondary | ICD-10-CM | POA: Diagnosis not present

## 2022-12-18 DIAGNOSIS — D5700 Hb-SS disease with crisis, unspecified: Secondary | ICD-10-CM

## 2022-12-18 DIAGNOSIS — Z79891 Long term (current) use of opiate analgesic: Secondary | ICD-10-CM | POA: Diagnosis not present

## 2022-12-18 DIAGNOSIS — M79604 Pain in right leg: Secondary | ICD-10-CM | POA: Diagnosis not present

## 2022-12-18 DIAGNOSIS — M545 Low back pain, unspecified: Secondary | ICD-10-CM | POA: Diagnosis not present

## 2022-12-18 MED ORDER — HYDROMORPHONE HCL 4 MG PO TABS
4.0000 mg | ORAL_TABLET | Freq: Four times a day (QID) | ORAL | 0 refills | Status: DC | PRN
Start: 2022-12-18 — End: 2022-12-23

## 2022-12-18 NOTE — Progress Notes (Signed)
Refilled patient's pain medication for total of 36 tablets.  She was dispensed 25 tablets on 12/13/2022 but her pharmacy did not have enough tablets for the entire prescription.  Patient will not be able to get her pain medicine refilled until 12/27/22.

## 2022-12-18 NOTE — Telephone Encounter (Signed)
Pharmacy advise. Gh

## 2022-12-23 ENCOUNTER — Ambulatory Visit (INDEPENDENT_AMBULATORY_CARE_PROVIDER_SITE_OTHER): Payer: Medicare HMO | Admitting: Nurse Practitioner

## 2022-12-23 ENCOUNTER — Other Ambulatory Visit: Payer: Self-pay | Admitting: Nurse Practitioner

## 2022-12-23 ENCOUNTER — Encounter: Payer: Self-pay | Admitting: Nurse Practitioner

## 2022-12-23 ENCOUNTER — Other Ambulatory Visit: Payer: Self-pay

## 2022-12-23 ENCOUNTER — Emergency Department (HOSPITAL_COMMUNITY)
Admission: EM | Admit: 2022-12-23 | Discharge: 2022-12-23 | Disposition: A | Discharge status: Home / Self Care | Payer: Medicare HMO | Attending: Emergency Medicine | Admitting: Emergency Medicine

## 2022-12-23 ENCOUNTER — Encounter (HOSPITAL_COMMUNITY): Payer: Self-pay | Admitting: Emergency Medicine

## 2022-12-23 ENCOUNTER — Emergency Department (HOSPITAL_COMMUNITY): Payer: Medicare HMO

## 2022-12-23 ENCOUNTER — Non-Acute Institutional Stay (HOSPITAL_COMMUNITY)
Admission: RE | Admit: 2022-12-23 | Discharge: 2022-12-23 | Disposition: A | Discharge status: Home / Self Care | Payer: Medicare HMO | Source: Ambulatory Visit | Attending: Internal Medicine | Admitting: Internal Medicine

## 2022-12-23 VITALS — BP 98/72 | HR 89 | Temp 97.7°F | Wt 111.2 lb

## 2022-12-23 DIAGNOSIS — D5700 Hb-SS disease with crisis, unspecified: Secondary | ICD-10-CM

## 2022-12-23 DIAGNOSIS — M549 Dorsalgia, unspecified: Secondary | ICD-10-CM | POA: Diagnosis not present

## 2022-12-23 DIAGNOSIS — Z86711 Personal history of pulmonary embolism: Secondary | ICD-10-CM | POA: Insufficient documentation

## 2022-12-23 DIAGNOSIS — J45909 Unspecified asthma, uncomplicated: Secondary | ICD-10-CM | POA: Insufficient documentation

## 2022-12-23 DIAGNOSIS — Z7902 Long term (current) use of antithrombotics/antiplatelets: Secondary | ICD-10-CM | POA: Diagnosis not present

## 2022-12-23 DIAGNOSIS — Z452 Encounter for adjustment and management of vascular access device: Secondary | ICD-10-CM | POA: Insufficient documentation

## 2022-12-23 DIAGNOSIS — E559 Vitamin D deficiency, unspecified: Secondary | ICD-10-CM | POA: Diagnosis not present

## 2022-12-23 LAB — CBC WITH DIFFERENTIAL/PLATELET
Abs Immature Granulocytes: 0.1 10*3/uL — ABNORMAL HIGH (ref 0.00–0.07)
Basophils Absolute: 0.1 10*3/uL (ref 0.0–0.1)
Basophils Relative: 1 %
Eosinophils Absolute: 0.3 10*3/uL (ref 0.0–0.5)
Eosinophils Relative: 2 %
HCT: 23.6 % — ABNORMAL LOW (ref 36.0–46.0)
Hemoglobin: 7.8 g/dL — ABNORMAL LOW (ref 12.0–15.0)
Immature Granulocytes: 1 %
Lymphocytes Relative: 20 %
Lymphs Abs: 3.1 10*3/uL (ref 0.7–4.0)
MCH: 30.8 pg (ref 26.0–34.0)
MCHC: 33.1 g/dL (ref 30.0–36.0)
MCV: 93.3 fL (ref 80.0–100.0)
Monocytes Absolute: 1.9 10*3/uL — ABNORMAL HIGH (ref 0.1–1.0)
Monocytes Relative: 12 %
Neutro Abs: 10.2 10*3/uL — ABNORMAL HIGH (ref 1.7–7.7)
Neutrophils Relative %: 64 %
Platelets: 511 10*3/uL — ABNORMAL HIGH (ref 150–400)
RBC: 2.53 MIL/uL — ABNORMAL LOW (ref 3.87–5.11)
RDW: 20.2 % — ABNORMAL HIGH (ref 11.5–15.5)
WBC: 15.6 10*3/uL — ABNORMAL HIGH (ref 4.0–10.5)
nRBC: 1.2 % — ABNORMAL HIGH (ref 0.0–0.2)

## 2022-12-23 LAB — BASIC METABOLIC PANEL
Anion gap: 7 (ref 5–15)
BUN: <5 mg/dL — ABNORMAL LOW (ref 6–20)
CO2: 25 mmol/L (ref 22–32)
Calcium: 8.9 mg/dL (ref 8.9–10.3)
Chloride: 105 mmol/L (ref 98–111)
Creatinine, Ser: 0.45 mg/dL (ref 0.44–1.00)
GFR, Estimated: >60 mL/min (ref >60)
Glucose, Bld: 91 mg/dL (ref 70–99)
Potassium: 3.6 mmol/L (ref 3.5–5.1)
Sodium: 137 mmol/L (ref 135–145)

## 2022-12-23 LAB — RETICULOCYTES
Immature Retic Fract: 35.6 % — ABNORMAL HIGH (ref 2.3–15.9)
RBC.: 2.5 MIL/uL — ABNORMAL LOW (ref 3.87–5.11)
Retic Count, Absolute: 358.5 10*3/uL — ABNORMAL HIGH (ref 19.0–186.0)
Retic Ct Pct: 14.3 % — ABNORMAL HIGH (ref 0.4–3.1)

## 2022-12-23 LAB — HCG, SERUM, QUALITATIVE: Preg, Serum: NEGATIVE

## 2022-12-23 MED ORDER — HYDROMORPHONE HCL 2 MG/ML IJ SOLN
2.0000 mg | INTRAMUSCULAR | Status: AC
  Administered 2022-12-23: 2 mg via INTRAVENOUS
  Filled 2022-12-23: qty 1

## 2022-12-23 MED ORDER — ALBUTEROL SULFATE HFA 108 (90 BASE) MCG/ACT IN AERS
2.0000 | INHALATION_SPRAY | Freq: Four times a day (QID) | RESPIRATORY_TRACT | 0 refills | Status: DC | PRN
Start: 2022-12-23 — End: 2023-01-22

## 2022-12-23 MED ORDER — HYDROMORPHONE HCL 4 MG PO TABS: 4.0000 mg | ORAL_TABLET | Freq: Four times a day (QID) | ORAL | 0 refills | Status: DC | PRN

## 2022-12-23 MED ORDER — HEPARIN SOD (PORK) LOCK FLUSH 100 UNIT/ML IV SOLN
500.0000 [IU] | Freq: Once | INTRAVENOUS | Status: DC
  Filled 2022-12-23: qty 5

## 2022-12-23 MED ORDER — VITAMIN D (ERGOCALCIFEROL) 1.25 MG (50000 UNIT) PO CAPS: 50000.0000 [IU] | ORAL_CAPSULE | ORAL | 11 refills | Status: AC

## 2022-12-23 MED ORDER — NALOXONE HCL 4 MG/0.1ML NA LIQD: 0.4000 mg | Freq: Once | NASAL | 2 refills | Status: AC | PRN

## 2022-12-23 MED ORDER — SODIUM CHLORIDE 0.9% FLUSH
10.0000 mL | INTRAVENOUS | Status: AC | PRN
  Administered 2022-12-23: 10 mL

## 2022-12-23 MED ORDER — FOLIC ACID 1 MG PO TABS: 1.0000 mg | ORAL_TABLET | Freq: Every day | ORAL | 2 refills | Status: AC

## 2022-12-23 MED ORDER — MOMETASONE FURO-FORMOTEROL FUM 100-5 MCG/ACT IN AERO
2.0000 | INHALATION_SPRAY | Freq: Every day | RESPIRATORY_TRACT | 5 refills | Status: DC | PRN
Start: 2022-12-23 — End: 2022-12-25

## 2022-12-23 MED ORDER — DEXTROSE-SODIUM CHLORIDE 5-0.45 % IV SOLN: INTRAVENOUS | Status: DC

## 2022-12-23 MED ORDER — MIRTAZAPINE 45 MG PO TABS
45.0000 mg | ORAL_TABLET | Freq: Every day | ORAL | 3 refills | Status: DC
Start: 2022-12-23 — End: 2024-03-05

## 2022-12-23 MED ORDER — POLYETHYLENE GLYCOL 3350 17 G PO PACK: 17.0000 g | PACK | Freq: Every day | ORAL | 2 refills | Status: AC | PRN

## 2022-12-23 MED ORDER — HEPARIN SOD (PORK) LOCK FLUSH 100 UNIT/ML IV SOLN
500.0000 [IU] | INTRAVENOUS | Status: AC | PRN
  Administered 2022-12-23: 500 [IU]
  Filled 2022-12-23: qty 5

## 2022-12-23 MED ORDER — DIPHENHYDRAMINE HCL 25 MG PO CAPS
25.0000 mg | ORAL_CAPSULE | ORAL | Status: DC | PRN
  Administered 2022-12-23: 25 mg via ORAL
  Filled 2022-12-23: qty 1

## 2022-12-23 MED ORDER — ONDANSETRON HCL 4 MG/2ML IJ SOLN: 4.0000 mg | INTRAMUSCULAR | Status: DC | PRN

## 2022-12-23 NOTE — Patient Instructions (Addendum)
1. Sickle cell pain crisis (HCC)  - Drug Screen 11 w/Conf, Ser - Sickle Cell Panel  2. Sickle cell anemia with pain (HCC)  - albuterol (VENTOLIN HFA) 108 (90 Base) MCG/ACT inhaler; Inhale 2 puffs into the lungs every 6 (six) hours as needed for wheezing or shortness of breath.  Dispense: 8 g; Refill: 0 - mometasone-formoterol (DULERA) 100-5 MCG/ACT AERO; Inhale 2 puffs into the lungs daily as needed for wheezing or shortness of breath.  Dispense: 1 each; Refill: 5 - HYDROmorphone (DILAUDID) 4 MG tablet; Take 1 tablet (4 mg total) by mouth every 6 (six) hours as needed (for pain).  Dispense: 36 tablet; Refill: 0 - mirtazapine (REMERON) 45 MG tablet; Take 1 tablet (45 mg total) by mouth at bedtime.  Dispense: 90 tablet; Refill: 3  -Discussed need for good hydration  -Monitor hydration status  -Avoid heat, cold, stress, and infectious triggers  -Discussed the importance of adherence to medication regimen  -Please seek medical attention immediately if any symptoms of bleeding, anemia, infection occurs  Follow up:  Follow up in 1 month

## 2022-12-23 NOTE — Progress Notes (Signed)
@Patient  ID: Molly Gutierrez, female    DOB: Feb 18, 1981, 42 y.o.   MRN: [id number]  Chief Complaint  Patient presents with   Sickle Cell Anemia    Referring provider: [name], NP   HPI   [facility] presents for follow up  She  has a past medical history of Asthma, Eczema, History of pulmonary embolus (PE), and Sickle cell anemia (HCC).    Patient presents today for sickle cell follow-up. Patient was recently in the hospital for sickle cell crisis.  Patient states that she is still currently having crisis.  She states that pain is mainly in her lower back.  We discussed that she can return tomorrow morning to be triaged in the day hospital.  We will check labs while she is here today.  Patient does need refills on her medications.  Denies f/c/s, n/v/d, hemoptysis, PND, leg swelling Denies chest pain or edema      Allergies  Allergen Reactions   Cefaclor Hives and Swelling   Hydroxyurea Palpitations and Other (See Comments)    Lowers "blood levels" and heart rate (causes HYPOtension); "it messes me up, it drops my levels and stuff"    Omeprazole Other (See Comments)    Causes "sharp pains in the stomach"   Ketamine Palpitations and Other (See Comments)    "Pt states she has had previous reaction to ketamine. States she becomes flushed, heart races, dizzy, and feels like she is going to pass out."    Immunization History  Administered Date(s) Administered   PFIZER Comirnaty(Gray Top)Covid-19 Tri-Sucrose Vaccine 09/25/2019, 10/16/2019   PFIZER(Purple Top)SARS-COV-2 Vaccination 09/25/2019, 10/16/2019   Pfizer Covid-19 Vaccine Bivalent Booster 46yrs & up 09/25/2019, 10/16/2019   Tdap 07/09/2019    Past Medical History:  Diagnosis Date   Asthma    Eczema    History of pulmonary embolus (PE)    Sickle cell anemia (HCC)     Tobacco History: Social History   Tobacco Use  Smoking Status Never  Smokeless Tobacco Never   Counseling given: Not  Answered   No facility-administered encounter medications on file as of 12/23/2022.   Outpatient Encounter Medications as of 12/23/2022  Medication Sig   acetaminophen (TYLENOL) 500 MG tablet Take 500 mg by mouth every 6 (six) hours as needed for mild pain or headache (or cramps).   Cholecalciferol (VITAMIN D3 PO) Take 1 capsule by mouth at bedtime.   Cyanocobalamin (VITAMIN B-12 PO) Take 1 tablet by mouth at bedtime.   Deferasirox (JADENU) 360 MG TABS Take 1,080 mg by mouth daily.   diphenhydrAMINE (BENADRYL) 25 MG tablet Take 25 mg by mouth every 4 (four) hours as needed for itching (with hydromorphone dose).   OXBRYTA 500 MG TABS tablet Take 1,500 mg by mouth daily.   promethazine (PHENERGAN) 25 MG tablet Take 0.5-1 tablets (12.5-25 mg total) by mouth every 6 (six) hours as needed for nausea or vomiting. (Patient taking differently: Take 25 mg by mouth every 4 (four) hours as needed for nausea or vomiting (with hydromorphone dose).)   XARELTO 20 MG TABS tablet Take 1 tablet (20 mg total) by mouth daily with supper. (Patient taking differently: Take 20 mg by mouth at bedtime.)   [DISCONTINUED] albuterol (VENTOLIN HFA) 108 (90 Base) MCG/ACT inhaler Inhale 2 puffs into the lungs every 6 (six) hours as needed for wheezing or shortness of breath.   [DISCONTINUED] folic acid (FOLVITE) 1 MG tablet Take 1 mg by mouth daily.   [DISCONTINUED] HYDROmorphone (DILAUDID) 4 MG  tablet Take 1 tablet (4 mg total) by mouth every 6 (six) hours as needed (for pain).   [DISCONTINUED] mirtazapine (REMERON) 45 MG tablet Take 1 tablet (45 mg total) by mouth at bedtime.   [DISCONTINUED] mometasone-formoterol (DULERA) 100-5 MCG/ACT AERO Inhale 2 puffs into the lungs daily as needed for wheezing or shortness of breath.   [DISCONTINUED] naloxone (NARCAN) nasal spray 4 mg/0.1 mL Place 0.4 mg into the nose once as needed (opioid overdose).   [DISCONTINUED] polyethylene glycol (MIRALAX / GLYCOLAX) 17 g packet Take 17 g by mouth  daily as needed for mild constipation (MIX AS DIRECTED AND DRINK).   [DISCONTINUED] rivaroxaban (XARELTO) 20 MG TABS tablet Take 20 mg by mouth every evening.   [DISCONTINUED] Vitamin D, Ergocalciferol, (DRISDOL) 1.25 MG (50000 UNIT) CAPS capsule Take 1 capsule by mouth once a week.   albuterol (VENTOLIN HFA) 108 (90 Base) MCG/ACT inhaler Inhale 2 puffs into the lungs every 6 (six) hours as needed for wheezing or shortness of breath.   fluticasone (FLONASE) 50 MCG/ACT nasal spray Place 1 spray into both nostrils daily as needed for allergies or rhinitis. (Patient not taking: Reported on 08/19/2022)   folic acid (FOLVITE) 1 MG tablet Take 1 tablet (1 mg total) by mouth daily.   [START ON 12/27/2022] HYDROmorphone (DILAUDID) 4 MG tablet Take 1 tablet (4 mg total) by mouth every 6 (six) hours as needed (for pain).   mirtazapine (REMERON) 45 MG tablet Take 1 tablet (45 mg total) by mouth at bedtime.   mometasone-formoterol (DULERA) 100-5 MCG/ACT AERO Inhale 2 puffs into the lungs daily as needed for wheezing or shortness of breath.   naloxone (NARCAN) nasal spray 4 mg/0.1 mL Place 0.1 sprays (0.4 mg total) into the nose once as needed (opioid overdose).   polyethylene glycol (MIRALAX / GLYCOLAX) 17 g packet Take 17 g by mouth daily as needed for mild constipation (MIX AS DIRECTED AND DRINK).   Vitamin D, Ergocalciferol, (DRISDOL) 1.25 MG (50000 UNIT) CAPS capsule Take 1 capsule (50,000 Units total) by mouth once a week.     Review of Systems  Review of Systems  Constitutional: Negative.   HENT: Negative.    Cardiovascular: Negative.   Gastrointestinal: Negative.   Musculoskeletal:  Positive for back pain.  Allergic/Immunologic: Negative.   Neurological: Negative.   Psychiatric/Behavioral: Negative.         Physical Exam  BP 98/72   Pulse 89   Temp 97.7 F (36.5 C)   Wt 111 lb 3.2 oz (50.4 kg)   LMP 12/09/2022   SpO2 100%   BMI 19.70 kg/m   Wt Readings from Last 5 Encounters:   12/23/22 111 lb 1.8 oz (50.4 kg)  12/23/22 111 lb 3.2 oz (50.4 kg)  12/09/22 112 lb 14 oz (51.2 kg)  10/16/22 108 lb 6.4 oz (49.2 kg)  07/18/22 114 lb (51.7 kg)     Physical Exam Vitals and nursing note reviewed.  Constitutional:      General: She is not in acute distress.    Appearance: She is well-developed.  Cardiovascular:     Rate and Rhythm: Normal rate and regular rhythm.  Pulmonary:     Effort: Pulmonary effort is normal.     Breath sounds: Normal breath sounds.  Neurological:     Mental Status: She is alert and oriented to person, place, and time.      Lab Results:  CBC    Component Value Date/Time   WBC 10.7 (H) 12/12/2022 0500  RBC 2.54 (L) 12/12/2022 0500   HGB 7.7 (L) 12/12/2022 0500   HGB 7.8 (L) 05/23/2022 1421   HCT 22.7 (L) 12/12/2022 0500   HCT 23.0 (L) 05/23/2022 1421   PLT 405 (H) 12/12/2022 0500   PLT 654 (H) 05/23/2022 1421   MCV 89.4 12/12/2022 0500   MCV 89 05/23/2022 1421   MCH 30.3 12/12/2022 0500   MCHC 33.9 12/12/2022 0500   RDW 18.5 (H) 12/12/2022 0500   RDW 18.6 (H) 05/23/2022 1421   LYMPHSABS 3.3 (H) 05/23/2022 1421   MONOABS 1.5 (H) 04/02/2022 1051   EOSABS 0.8 (H) 05/23/2022 1421   BASOSABS 0.2 05/23/2022 1421    BMET    Component Value Date/Time   NA 137 12/12/2022 0500   NA 136 05/23/2022 1421   K 4.0 12/12/2022 0500   CL 107 12/12/2022 0500   CO2 25 12/12/2022 0500   GLUCOSE 108 (H) 12/12/2022 0500   BUN 13 12/12/2022 0500   BUN 9 05/23/2022 1421   CREATININE 0.62 12/12/2022 0500   CALCIUM 8.1 (L) 12/12/2022 0500   GFRNONAA >60 12/12/2022 0500   GFRAA 136 08/02/2020 1538    BNP    Component Value Date/Time   BNP 70.6 05/05/2019 0204    ProBNP No results found for: "PROBNP"  Imaging: No results found.   Assessment & Plan:   Sickle cell pain crisis (HCC) - Drug Screen 11 w/Conf, Ser - Sickle Cell Panel  2. Sickle cell anemia with pain (HCC)  - albuterol (VENTOLIN HFA) 108 (90 Base) MCG/ACT  inhaler; Inhale 2 puffs into the lungs every 6 (six) hours as needed for wheezing or shortness of breath.  Dispense: 8 g; Refill: 0 - mometasone-formoterol (DULERA) 100-5 MCG/ACT AERO; Inhale 2 puffs into the lungs daily as needed for wheezing or shortness of breath.  Dispense: 1 each; Refill: 5 - HYDROmorphone (DILAUDID) 4 MG tablet; Take 1 tablet (4 mg total) by mouth every 6 (six) hours as needed (for pain).  Dispense: 36 tablet; Refill: 0 - mirtazapine (REMERON) 45 MG tablet; Take 1 tablet (45 mg total) by mouth at bedtime.  Dispense: 90 tablet; Refill: 3  -Discussed need for good hydration  -Monitor hydration status  -Avoid heat, cold, stress, and infectious triggers  -Discussed the importance of adherence to medication regimen  -Please seek medical attention immediately if any symptoms of bleeding, anemia, infection occurs  Follow up:  Follow up in 1 month     [name], NP 12/23/2022

## 2022-12-23 NOTE — Discharge Instructions (Addendum)
You have been seen today for your complaint of sickle cell pain. Your lab work was reassuring. Your imaging was reassuring. Your discharge medications include your home medications. Follow up with: your PCP as soon as possible Please seek immediate medical care if you develop any of the following symptoms: You become short of breath or are having trouble breathing. You have pain that cannot be controlled with medicine. You have any signs of a stroke. "BE FAST" is an easy way to remember the main warning signs: B - Balance. Dizziness, sudden trouble walking, or loss of balance. E - Eyes. Trouble seeing or a change in how you see. F - Face. Sudden weakness or loss of feeling of the face. The face or eyelid may droop on one side. A - Arms. Weakness or loss of feeling in an arm. This happens all of a sudden and most often on one side of the body. S - Speech. Sudden trouble speaking, slurred speech, or trouble understanding what people say. T - Time. Time to call emergency services. Write down what time symptoms started. You have other signs of a stroke, such as: A sudden, very bad headache with no known cause. Feeling like you may vomit (nausea). Vomiting. Seizure. At this time there does not appear to be the presence of an emergent medical condition, however there is always the potential for conditions to change. Please read and follow the below instructions.  Do not take your medicine if  develop an itchy rash, swelling in your mouth or lips, or difficulty breathing; call 911 and seek immediate emergency medical attention if this occurs.  You may review your lab tests and imaging results in their entirety on your MyChart account.  Please discuss all results of fully with your primary care provider and other specialist at your follow-up visit.  Note: Portions of this text may have been transcribed using voice recognition software. Every effort was made to ensure accuracy; however, inadvertent  computerized transcription errors may still be present.

## 2022-12-23 NOTE — ED Triage Notes (Signed)
Pt endorses back pain and leg pain that hasn't stopped since being in hospital. Sickle cell pt.

## 2022-12-23 NOTE — Assessment & Plan Note (Signed)
-   Drug Screen 11 w/Conf, Ser - Sickle Cell Panel  2. Sickle cell anemia with pain (HCC)  - albuterol (VENTOLIN HFA) 108 (90 Base) MCG/ACT inhaler; Inhale 2 puffs into the lungs every 6 (six) hours as needed for wheezing or shortness of breath.  Dispense: 8 g; Refill: 0 - mometasone-formoterol (DULERA) 100-5 MCG/ACT AERO; Inhale 2 puffs into the lungs daily as needed for wheezing or shortness of breath.  Dispense: 1 each; Refill: 5 - HYDROmorphone (DILAUDID) 4 MG tablet; Take 1 tablet (4 mg total) by mouth every 6 (six) hours as needed (for pain).  Dispense: 36 tablet; Refill: 0 - mirtazapine (REMERON) 45 MG tablet; Take 1 tablet (45 mg total) by mouth at bedtime.  Dispense: 90 tablet; Refill: 3  -Discussed need for good hydration  -Monitor hydration status  -Avoid heat, cold, stress, and infectious triggers  -Discussed the importance of adherence to medication regimen  -Please seek medical attention immediately if any symptoms of bleeding, anemia, infection occurs  Follow up:  Follow up in 1 month

## 2022-12-23 NOTE — ED Provider Notes (Signed)
[place] EMERGENCY DEPARTMENT AT [facility] Provider Note   CSN: [id number] Arrival date & time: 12/23/22  1230     History  Chief Complaint  Patient presents with   Sickle Cell Pain Crisis    Leela Crackel is a 42 y.o. female.  With history of sickle cell anemia, previous PE on Xarelto, asthma who presents to the ED for evaluation of sickle cell pain.  This is localized to the low back and bilateral lower extremities.  It feels like her typical sickle cell pain.  She states she was admitted to the hospital approximately 2 weeks ago and did not feel completely better by the time she was discharged.  She has been trying to manage at home with her home hydromorphone and hydroxyurea.  She had a follow-up appointment with her sickle cell provider today and was encouraged to come to the emergency department if she needs to.  She states the day hospital is closed today.  She denies any chest pain or shortness of breath.  No cough, fevers or chills.  She reports some nausea but no vomiting.  No abdominal pain.  No dysuria, frequency or urgency.   Sickle Cell Pain Crisis      Home Medications Prior to Admission medications   Medication Sig Start Date End Date Taking? Authorizing Provider  acetaminophen (TYLENOL) 500 MG tablet Take 500 mg by mouth every 6 (six) hours as needed for mild pain or headache (or cramps).    [provider]  albuterol (VENTOLIN HFA) 108 (90 Base) MCG/ACT inhaler Inhale 2 puffs into the lungs every 6 (six) hours as needed for wheezing or shortness of breath. 12/23/22   [name], NP  Cholecalciferol (VITAMIN D3 PO) Take 1 capsule by mouth at bedtime.    [provider]  Cyanocobalamin (VITAMIN B-12 PO) Take 1 tablet by mouth at bedtime.    [provider]  Deferasirox (JADENU) 360 MG TABS Take 1,080 mg by mouth daily.    [provider]  diphenhydrAMINE (BENADRYL) 25 MG tablet Take 25 mg by mouth every 4  (four) hours as needed for itching (with hydromorphone dose).    [provider]  fluticasone (FLONASE) 50 MCG/ACT nasal spray Place 1 spray into both nostrils daily as needed for allergies or rhinitis. Patient not taking: Reported on 08/19/2022 12/31/21 04/02/22  [name], NP  folic acid (FOLVITE) 1 MG tablet Take 1 tablet (1 mg total) by mouth daily. 12/23/22   [name], NP  HYDROmorphone (DILAUDID) 4 MG tablet Take 1 tablet (4 mg total) by mouth every 6 (six) hours as needed (for pain). 12/27/22   [name], NP  mirtazapine (REMERON) 45 MG tablet Take 1 tablet (45 mg total) by mouth at bedtime. 12/23/22 12/23/23  [name], NP  mometasone-formoterol (DULERA) 100-5 MCG/ACT AERO Inhale 2 puffs into the lungs daily as needed for wheezing or shortness of breath. 12/23/22 12/23/23  [name], NP  naloxone [facility]) nasal spray 4 mg/0.1 mL Place 0.1 sprays (0.4 mg total) into the nose once as needed (opioid overdose). 12/23/22   [name], NP  OXBRYTA 500 MG TABS tablet Take 1,500 mg by mouth daily. 07/26/22   [provider]  polyethylene glycol (MIRALAX / GLYCOLAX) 17 g packet Take 17 g by mouth daily as needed for mild constipation (MIX AS DIRECTED AND DRINK). 12/23/22   [name], NP  promethazine (PHENERGAN) 25 MG tablet Take 0.5-1 tablets (  12.5-25 mg total) by mouth every 6 (six) hours as needed for nausea or vomiting. Patient taking differently: Take 25 mg by mouth every 4 (four) hours as needed for nausea or vomiting (with hydromorphone dose). 10/31/21 12/23/22  [name], NP  Vitamin D, Ergocalciferol, (DRISDOL) 1.25 MG (50000 UNIT) CAPS capsule Take 1 capsule (50,000 Units total) by mouth once a week. 12/23/22 12/23/23  [name], NP  XARELTO 20 MG TABS tablet Take 1 tablet (20 mg total) by mouth daily with supper. Patient taking differently: Take 20 mg by mouth at bedtime. 10/29/21 12/23/22  [name], NP      Allergies    Cefaclor,  Hydroxyurea, Omeprazole, and Ketamine    Review of Systems   Review of Systems  Musculoskeletal:  Positive for arthralgias, back pain and myalgias.  All other systems reviewed and are negative.   Physical Exam Updated Vital Signs BP 119/82   Pulse 99   Temp 98.7 F (37.1 C)   Resp 20   Ht 5\' 3"  (1.6 m)   Wt 50.4 kg   LMP 12/09/2022   SpO2 100%   BMI 19.68 kg/m  Physical Exam Vitals and nursing note reviewed.  Constitutional:      General: She is not in acute distress.    Appearance: Normal appearance. She is well-developed. She is not ill-appearing, toxic-appearing or diaphoretic.     Comments: Resting comfortably in bed  HENT:     Head: Normocephalic and atraumatic.  Eyes:     Conjunctiva/sclera: Conjunctivae normal.  Cardiovascular:     Rate and Rhythm: Normal rate and regular rhythm.     Heart sounds: No murmur heard. Pulmonary:     Effort: Pulmonary effort is normal. No respiratory distress.     Breath sounds: Normal breath sounds. No wheezing, rhonchi or rales.  Abdominal:     Palpations: Abdomen is soft.     Tenderness: There is no abdominal tenderness. There is no guarding.  Musculoskeletal:        General: No swelling.     Cervical back: Neck supple.  Skin:    General: Skin is warm and dry.     Capillary Refill: Capillary refill takes less than 2 seconds.  Neurological:     Mental Status: She is alert.  Psychiatric:        Mood and Affect: Mood normal.     ED Results / Procedures / Treatments   Labs (all labs ordered are listed, but only abnormal results are displayed) Labs Reviewed  CBC WITH DIFFERENTIAL/PLATELET - Abnormal; Notable for the following components:      Result Value   WBC 15.6 (*)    RBC 2.53 (*)    Hemoglobin 7.8 (*)    HCT 23.6 (*)    RDW 20.2 (*)    Platelets 511 (*)    nRBC 1.2 (*)    Neutro Abs 10.2 (*)    Monocytes Absolute 1.9 (*)    Abs Immature Granulocytes 0.10 (*)    All other components within normal limits   RETICULOCYTES - Abnormal; Notable for the following components:   Retic Ct Pct 14.3 (*)    RBC. 2.50 (*)    Retic Count, Absolute 358.5 (*)    Immature Retic Fract 35.6 (*)    All other components within normal limits  BASIC METABOLIC PANEL - Abnormal; Notable for the following components:   BUN <5 (*)    All other components within normal limits  HCG, SERUM,  QUALITATIVE  URINALYSIS, ROUTINE W REFLEX MICROSCOPIC    EKG None  Radiology DG Chest Port 1 View  Result Date: 12/23/2022 CLINICAL DATA:  Back pain.  History of sickle cell EXAM: PORTABLE CHEST 1 VIEW COMPARISON:  Chest radiograph 02/03/2022. FINDINGS: Bilateral chest wall ports are stable in position with tip terminating in the region of the cavoatrial junction and right atrium. Cardiomediastinal silhouette is stable and within normal limits There is no focal consolidation or pulmonary edema. There is no pleural effusion or pneumothorax There is no acute osseous abnormality. IMPRESSION: Stable chest with no radiographic evidence of acute cardiopulmonary process. Electronically Signed   By: Lesia Hausen M.D.   On: 12/23/2022 13:46    Procedures Procedures    Medications Ordered in ED Medications  dextrose 5 % and 0.45 % NaCl infusion ( Intravenous New Bag/Given 12/23/22 1428)  diphenhydrAMINE (BENADRYL) capsule 25-50 mg (25 mg Oral Given 12/23/22 1643)  ondansetron (ZOFRAN) injection 4 mg (has no administration in time range)  HYDROmorphone (DILAUDID) injection 2 mg (2 mg Intravenous Given 12/23/22 1424)  HYDROmorphone (DILAUDID) injection 2 mg (2 mg Intravenous Given 12/23/22 1522)  HYDROmorphone (DILAUDID) injection 2 mg (2 mg Intravenous Given 12/23/22 1642)    ED Course/ Medical Decision Making/ A&P                             Medical Decision Making Amount and/or Complexity of Data Reviewed Labs: ordered. Radiology: ordered.  Risk Prescription drug management.  This patient presents to the ED for concern of sickle  cell pain, this involves an extensive number of treatment options, and is a complaint that carries with it a high risk of complications and morbidity.  The differential diagnosis includes sickle cell crisis, acute chest syndrome, aplastic crisis  Co morbidities that complicate the patient evaluation  sickle cell anemia, previous PE on Xarelto, asthma   My initial workup includes labs, imaging, EKG, symptom control  Additional history obtained from: Nursing notes from this visit. Previous records within EMR system ED visits for same on 12/18/2022, 12/09/2022, 12/08/2022, 11/24/2022  I ordered, reviewed and interpreted labs which include: CBC, BMP, reticulocytes, hCG.  Reticulocytes elevated.  Stable anemia with hemoglobin of 7.8.  leukocytosis of 15.6 likely reactive  I ordered imaging studies including chest x-ray I independently visualized and interpreted imaging which showed negative I agree with the radiologist interpretation  Cardiac Monitoring:  The patient was maintained on a cardiac monitor.  I personally viewed and interpreted the cardiac monitored which showed an underlying rhythm of: NSR  Afebrile, hemodynamically stable.  42 year old female presenting to the ED for evaluation of her typical sickle cell pain.  She has been taking her medicines at home.  She was going to go to the day hospital today but states that they were closed.  She appears well on physical examination.  She is in no acute distress.  Her lab workup was reassuring.  Reticulocytes are elevated.  No recent changes to her anemia.  No fevers or chills, no signs or symptoms of acute chest syndrome.  After treatment in the ED her pain is decreased to a 6 out of 10.  She states this is reasonable for discharge home.  She states she will follow-up as soon as possible.  She has adequate analgesia at home.  She was given return precautions.  Stable at discharge.  At this time there does not appear to be any evidence of an acute  emergency medical condition and the patient appears stable for discharge with appropriate outpatient follow up. Diagnosis was discussed with patient who verbalizes understanding of care plan and is agreeable to discharge. I have discussed return precautions with patient who verbalizes understanding. Patient encouraged to follow-up with their PCP within 1 week. All questions answered.  Note: Portions of this report may have been transcribed using voice recognition software. Every effort was made to ensure accuracy; however, inadvertent computerized transcription errors may still be present.        Final Clinical Impression(s) / ED Diagnoses Final diagnoses:  Sickle cell pain crisis [facility])    Rx / DC Orders ED Discharge Orders     None         Michelle Piper, Cordelia Poche 12/23/22 1807    Pricilla Loveless, MD 12/26/22 1615

## 2022-12-23 NOTE — Progress Notes (Signed)
Patient's right chest PAC accessed using sterile technique. Labs drawn from [facility] and given to [facility], phlebotomist [facility]. PAC flushed with 0.9% NS and Heparin. PAC de-accessed and band-aid placed over site. Patient alert, oriented and ambulatory at discharge.

## 2022-12-24 ENCOUNTER — Non-Acute Institutional Stay (HOSPITAL_COMMUNITY)
Admission: AD | Admit: 2022-12-24 | Discharge: 2022-12-24 | Disposition: A | Discharge status: Home / Self Care | Payer: Medicare HMO | Source: Ambulatory Visit | Attending: Internal Medicine | Admitting: Internal Medicine

## 2022-12-24 ENCOUNTER — Other Ambulatory Visit: Payer: Self-pay | Admitting: Family Medicine

## 2022-12-24 ENCOUNTER — Telehealth (HOSPITAL_COMMUNITY): Payer: Self-pay | Admitting: *Deleted

## 2022-12-24 DIAGNOSIS — D638 Anemia in other chronic diseases classified elsewhere: Secondary | ICD-10-CM | POA: Insufficient documentation

## 2022-12-24 DIAGNOSIS — Z09 Encounter for follow-up examination after completed treatment for conditions other than malignant neoplasm: Secondary | ICD-10-CM | POA: Diagnosis not present

## 2022-12-24 DIAGNOSIS — Z86711 Personal history of pulmonary embolism: Secondary | ICD-10-CM | POA: Diagnosis not present

## 2022-12-24 DIAGNOSIS — D5700 Hb-SS disease with crisis, unspecified: Secondary | ICD-10-CM | POA: Diagnosis present

## 2022-12-24 DIAGNOSIS — J4520 Mild intermittent asthma, uncomplicated: Secondary | ICD-10-CM | POA: Insufficient documentation

## 2022-12-24 DIAGNOSIS — G894 Chronic pain syndrome: Secondary | ICD-10-CM | POA: Insufficient documentation

## 2022-12-24 DIAGNOSIS — F1120 Opioid dependence, uncomplicated: Secondary | ICD-10-CM | POA: Diagnosis not present

## 2022-12-24 LAB — CBC WITH DIFFERENTIAL/PLATELET
Abs Immature Granulocytes: 0.12 10*3/uL — ABNORMAL HIGH (ref 0.00–0.07)
Basophils Absolute: 0.1 10*3/uL (ref 0.0–0.1)
Basophils Relative: 1 %
Eosinophils Absolute: 0.3 10*3/uL (ref 0.0–0.5)
Eosinophils Relative: 1 %
HCT: 23.8 % — ABNORMAL LOW (ref 36.0–46.0)
Hemoglobin: 7.8 g/dL — ABNORMAL LOW (ref 12.0–15.0)
Immature Granulocytes: 1 %
Lymphocytes Relative: 17 %
Lymphs Abs: 3.1 10*3/uL (ref 0.7–4.0)
MCH: 30.6 pg (ref 26.0–34.0)
MCHC: 32.8 g/dL (ref 30.0–36.0)
MCV: 93.3 fL (ref 80.0–100.0)
Monocytes Absolute: 2.5 10*3/uL — ABNORMAL HIGH (ref 0.1–1.0)
Monocytes Relative: 14 %
Neutro Abs: 12.2 10*3/uL — ABNORMAL HIGH (ref 1.7–7.7)
Neutrophils Relative %: 66 %
Platelets: 515 10*3/uL — ABNORMAL HIGH (ref 150–400)
RBC: 2.55 MIL/uL — ABNORMAL LOW (ref 3.87–5.11)
RDW: 19.8 % — ABNORMAL HIGH (ref 11.5–15.5)
WBC: 18.3 10*3/uL — ABNORMAL HIGH (ref 4.0–10.5)
nRBC: 0.7 % — ABNORMAL HIGH (ref 0.0–0.2)

## 2022-12-24 LAB — COMPREHENSIVE METABOLIC PANEL
ALT: 15 U/L (ref 0–44)
AST: 24 U/L (ref 15–41)
Albumin: 4.4 g/dL (ref 3.5–5.0)
Alkaline Phosphatase: 66 U/L (ref 38–126)
Anion gap: 8 (ref 5–15)
BUN: 11 mg/dL (ref 6–20)
CO2: 25 mmol/L (ref 22–32)
Calcium: 8.8 mg/dL — ABNORMAL LOW (ref 8.9–10.3)
Chloride: 103 mmol/L (ref 98–111)
Creatinine, Ser: 0.69 mg/dL (ref 0.44–1.00)
GFR, Estimated: >60 mL/min (ref >60)
Glucose, Bld: 93 mg/dL (ref 70–99)
Potassium: 3.8 mmol/L (ref 3.5–5.1)
Sodium: 136 mmol/L (ref 135–145)
Total Bilirubin: 3.7 mg/dL — ABNORMAL HIGH (ref 0.3–1.2)
Total Protein: 8.4 g/dL — ABNORMAL HIGH (ref 6.5–8.1)

## 2022-12-24 LAB — RETICULOCYTES
Immature Retic Fract: 22.4 % — ABNORMAL HIGH (ref 2.3–15.9)
RBC.: 2.51 MIL/uL — ABNORMAL LOW (ref 3.87–5.11)
Retic Count, Absolute: 342.6 10*3/uL — ABNORMAL HIGH (ref 19.0–186.0)
Retic Ct Pct: 13.7 % — ABNORMAL HIGH (ref 0.4–3.1)

## 2022-12-24 MED ORDER — HEPARIN SOD (PORK) LOCK FLUSH 100 UNIT/ML IV SOLN
500.0000 [IU] | INTRAVENOUS | Status: AC | PRN
  Administered 2022-12-24: 500 [IU]
  Filled 2022-12-24: qty 5

## 2022-12-24 MED ORDER — ONDANSETRON HCL 4 MG/2ML IJ SOLN: 4.0000 mg | Freq: Four times a day (QID) | INTRAMUSCULAR | Status: DC | PRN

## 2022-12-24 MED ORDER — HYDROMORPHONE 1 MG/ML IV SOLN
INTRAVENOUS | Status: DC
  Administered 2022-12-24: 30 mg via INTRAVENOUS
  Administered 2022-12-24: 10.5 mg via INTRAVENOUS
  Filled 2022-12-24: qty 30

## 2022-12-24 MED ORDER — ACETAMINOPHEN 500 MG PO TABS
1000.0000 mg | ORAL_TABLET | Freq: Once | ORAL | Status: DC
  Filled 2022-12-24: qty 2

## 2022-12-24 MED ORDER — SODIUM CHLORIDE 0.45 % IV SOLN: INTRAVENOUS | Status: DC

## 2022-12-24 MED ORDER — NALOXONE HCL 0.4 MG/ML IJ SOLN: 0.4000 mg | INTRAMUSCULAR | Status: DC | PRN

## 2022-12-24 MED ORDER — SODIUM CHLORIDE 0.9% FLUSH: 9.0000 mL | INTRAVENOUS | Status: DC | PRN

## 2022-12-24 MED ORDER — SODIUM CHLORIDE 0.9% FLUSH
10.0000 mL | INTRAVENOUS | Status: AC | PRN
  Administered 2022-12-24: 10 mL

## 2022-12-24 MED ORDER — DIPHENHYDRAMINE HCL 25 MG PO CAPS: 25.0000 mg | ORAL_CAPSULE | ORAL | Status: DC | PRN

## 2022-12-24 NOTE — H&P (Signed)
[facility] History and Physical   Date: 12/24/2022  Patient name: Molly Gutierrez Medical record number: [id number] Date of birth: 1980-12-20 Age: 42 y.o. Gender: female PCP: [name], NP  Attending physician: Quentin Angst, MD  Chief Complaint: Sickle cell pain  History of present illness:  Molly Gutierrez is a 42 year old female with a medical history significant for sickle cell disease, chronic pain syndrome, opiate dependence and tolerance, mild intermittent asthma, history of PE, and anemia of chronic disease presents to sickle cell day clinic with complaints of pain primarily to chest and upper back.  Pain intensity has been elevated over the past 2 days and unrelieved by her home medication.  She attributes pain crisis to her air conditioning being out in her apartment.  She states that on last night it was 89 degrees in her apartment.  She last had Dilaudid this a.m. without sustained relief.  She was also evaluated in the emergency department on yesterday and discharged home in stable condition.  Patient rates pain at 8/10, constant, and throbbing.  She denies any headache, shortness of breath, fever, or chills.  No urinary symptoms, nausea, vomiting, or diarrhea.   Meds: Medications Prior to Admission  Medication Sig Dispense Refill Last Dose   acetaminophen (TYLENOL) 500 MG tablet Take 500 mg by mouth every 6 (six) hours as needed for mild pain or headache (or cramps).   12/24/2022   Cholecalciferol (VITAMIN D3 PO) Take 1 capsule by mouth at bedtime.   Past Month   Cyanocobalamin (VITAMIN B-12 PO) Take 1 tablet by mouth at bedtime.   Past Week   Deferasirox (JADENU) 360 MG TABS Take 1,080 mg by mouth daily.   Past Week   diphenhydrAMINE (BENADRYL) 25 MG tablet Take 25 mg by mouth every 4 (four) hours as needed for itching (with hydromorphone dose).   12/23/2022   folic acid (FOLVITE) 1 MG tablet Take 1 tablet (1 mg total) by mouth daily. 30 tablet  2 12/23/2022   [START ON 12/27/2022] HYDROmorphone (DILAUDID) 4 MG tablet Take 1 tablet (4 mg total) by mouth every 6 (six) hours as needed (for pain). 36 tablet 0 12/24/2022   mirtazapine (REMERON) 45 MG tablet Take 1 tablet (45 mg total) by mouth at bedtime. 90 tablet 3 12/23/2022   mometasone-formoterol (DULERA) 100-5 MCG/ACT AERO Inhale 2 puffs into the lungs daily as needed for wheezing or shortness of breath. 1 each 5 Past Week   OXBRYTA 500 MG TABS tablet Take 1,500 mg by mouth daily.   Past Week   Vitamin D, Ergocalciferol, (DRISDOL) 1.25 MG (50000 UNIT) CAPS capsule Take 1 capsule (50,000 Units total) by mouth once a week. 4 capsule 11 Past Week   albuterol (VENTOLIN HFA) 108 (90 Base) MCG/ACT inhaler Inhale 2 puffs into the lungs every 6 (six) hours as needed for wheezing or shortness of breath. 8 g 0 More than a month   fluticasone (FLONASE) 50 MCG/ACT nasal spray Place 1 spray into both nostrils daily as needed for allergies or rhinitis. (Patient not taking: Reported on 08/19/2022) 9.9 mL 0    naloxone (NARCAN) nasal spray 4 mg/0.1 mL Place 0.1 sprays (0.4 mg total) into the nose once as needed (opioid overdose). 1 each 2    polyethylene glycol (MIRALAX / GLYCOLAX) 17 g packet Take 17 g by mouth daily as needed for mild constipation (MIX AS DIRECTED AND DRINK). 14 each 2 More than a month   promethazine (PHENERGAN) 25 MG tablet Take  0.5-1 tablets (12.5-25 mg total) by mouth every 6 (six) hours as needed for nausea or vomiting. (Patient taking differently: Take 25 mg by mouth every 4 (four) hours as needed for nausea or vomiting (with hydromorphone dose).) 30 tablet 11    XARELTO 20 MG TABS tablet Take 1 tablet (20 mg total) by mouth daily with supper. (Patient taking differently: Take 20 mg by mouth at bedtime.) 90 tablet 3     Allergies: Cefaclor, Hydroxyurea, Omeprazole, and Ketamine Past Medical History:  Diagnosis Date   Asthma    Eczema    History of pulmonary embolus (PE)    Sickle cell  anemia (HCC)    Past Surgical History:  Procedure Laterality Date   CHOLECYSTECTOMY     ERCP     JOINT REPLACEMENT     PORTA CATH INSERTION     TUBAL LIGATION     WISDOM TOOTH EXTRACTION     Family History  Problem Relation Age of Onset   Renal Disease Mother    Hypertension Mother    High Cholesterol Mother    Heart attack Mother    Diabetes Brother    Social History   Socioeconomic History   Marital status: Single    Spouse name: Not on file   Number of children: Not on file   Years of education: Not on file   Highest education level: Not on file  Occupational History   Not on file  Tobacco Use   Smoking status: Never   Smokeless tobacco: Never  Vaping Use   Vaping Use: Never used  Substance and Sexual Activity   Alcohol use: Never   Drug use: Never   Sexual activity: Not Currently  Other Topics Concern   Not on file  Social History Narrative   Not on file   Social Determinants of Health   Financial Resource Strain: Low Risk  (07/18/2022)   Overall Financial Resource Strain (CARDIA)    Difficulty of Paying Living Expenses: Not hard at all  Food Insecurity: No Food Insecurity (12/09/2022)   Hunger Vital Sign    Worried About Running Out of Food in the Last Year: Never true    Ran Out of Food in the Last Year: Never true  Transportation Needs: No Transportation Needs (12/09/2022)   PRAPARE - Administrator, Civil Service (Medical): No    Lack of Transportation (Non-Medical): No  Physical Activity: Inactive (07/18/2022)   Exercise Vital Sign    Days of Exercise per Week: 0 days    Minutes of Exercise per Session: 0 min  Stress: No Stress Concern Present (07/18/2022)   [employer] of Occupational Health - Occupational Stress Questionnaire    Feeling of Stress : Not at all  Social Connections: Moderately Integrated (07/18/2022)   Social Connection and Isolation Panel [NHANES]    Frequency of Communication with Friends and Family: More than three  times a week    Frequency of Social Gatherings with Friends and Family: More than three times a week    Attends Religious Services: More than 4 times per year    Active Member of [employer] or Organizations: Yes    Attends Banker Meetings: More than 4 times per year    Marital Status: Never married  Intimate Partner Violence: Not At Risk (12/09/2022)   Humiliation, Afraid, Rape, and Kick questionnaire    Fear of Current or Ex-Partner: No    Emotionally Abused: No    Physically Abused: No  Sexually Abused: No   Review of Systems  Constitutional: Negative.   HENT: Negative.    Eyes: Negative.   Respiratory: Negative.    Cardiovascular: Negative.   Gastrointestinal: Negative.   Genitourinary: Negative.   Musculoskeletal:  Positive for back pain and joint pain.  Skin: Negative.   Neurological: Negative.   Psychiatric/Behavioral: Negative.      Physical Exam: Blood pressure 97/71, pulse 88, temperature 98.8 F (37.1 C), temperature source Temporal, resp. rate 16, last menstrual period 12/07/2022, SpO2 97 %. Physical Exam Constitutional:      Appearance: Normal appearance.  Eyes:     Pupils: Pupils are equal, round, and reactive to light.  Cardiovascular:     Rate and Rhythm: Normal rate and regular rhythm.  Pulmonary:     Effort: Pulmonary effort is normal.  Abdominal:     General: Bowel sounds are normal.  Musculoskeletal:        General: Normal range of motion.  Skin:    General: Skin is warm.  Neurological:     General: No focal deficit present.     Mental Status: She is alert. Mental status is at baseline.  Psychiatric:        Mood and Affect: Mood normal.        Behavior: Behavior normal.        Thought Content: Thought content normal.        Judgment: Judgment normal.      Lab results: Results for orders placed or performed during the hospital encounter of 12/23/22 (from the past 24 hour(s))  CBC WITH DIFFERENTIAL     Status: Abnormal   Collection  Time: 12/23/22  2:25 PM  Result Value Ref Range   WBC 15.6 (H) 4.0 - 10.5 K/uL   RBC 2.53 (L) 3.87 - 5.11 MIL/uL   Hemoglobin 7.8 (L) 12.0 - 15.0 g/dL   HCT 40.9 (L) 81.1 - 91.4 %   MCV 93.3 80.0 - 100.0 fL   MCH 30.8 26.0 - 34.0 pg   MCHC 33.1 30.0 - 36.0 g/dL   RDW 78.2 (H) 95.6 - 21.3 %   Platelets 511 (H) 150 - 400 K/uL   nRBC 1.2 (H) 0.0 - 0.2 %   Neutrophils Relative % 64 %   Neutro Abs 10.2 (H) 1.7 - 7.7 K/uL   Lymphocytes Relative 20 %   Lymphs Abs 3.1 0.7 - 4.0 K/uL   Monocytes Relative 12 %   Monocytes Absolute 1.9 (H) 0.1 - 1.0 K/uL   Eosinophils Relative 2 %   Eosinophils Absolute 0.3 0.0 - 0.5 K/uL   Basophils Relative 1 %   Basophils Absolute 0.1 0.0 - 0.1 K/uL   Immature Granulocytes 1 %   Abs Immature Granulocytes 0.10 (H) 0.00 - 0.07 K/uL  Reticulocytes     Status: Abnormal   Collection Time: 12/23/22  2:25 PM  Result Value Ref Range   Retic Ct Pct 14.3 (H) 0.4 - 3.1 %   RBC. 2.50 (L) 3.87 - 5.11 MIL/uL   Retic Count, Absolute 358.5 (H) 19.0 - 186.0 K/uL   Immature Retic Fract 35.6 (H) 2.3 - 15.9 %  Basic metabolic panel     Status: Abnormal   Collection Time: 12/23/22  2:25 PM  Result Value Ref Range   Sodium 137 135 - 145 mmol/L   Potassium 3.6 3.5 - 5.1 mmol/L   Chloride 105 98 - 111 mmol/L   CO2 25 22 - 32 mmol/L   Glucose, Bld 91 70 -  99 mg/dL   BUN <5 (L) 6 - 20 mg/dL   Creatinine, Ser 4.78 0.44 - 1.00 mg/dL   Calcium 8.9 8.9 - 29.5 mg/dL   GFR, Estimated >62 >13 mL/min   Anion gap 7 5 - 15  hCG, serum, qualitative     Status: None   Collection Time: 12/23/22  2:25 PM  Result Value Ref Range   Preg, Serum NEGATIVE NEGATIVE    Imaging results:  DG Chest Port 1 View  Result Date: 12/23/2022 CLINICAL DATA:  Back pain.  History of sickle cell EXAM: PORTABLE CHEST 1 VIEW COMPARISON:  Chest radiograph 02/03/2022. FINDINGS: Bilateral chest wall ports are stable in position with tip terminating in the region of the cavoatrial junction and right  atrium. Cardiomediastinal silhouette is stable and within normal limits There is no focal consolidation or pulmonary edema. There is no pleural effusion or pneumothorax There is no acute osseous abnormality. IMPRESSION: Stable chest with no radiographic evidence of acute cardiopulmonary process. Electronically Signed   By: Lesia Hausen M.D.   On: 12/23/2022 13:46     Assessment & Plan: Patient admitted to sickle cell day infusion center for management of pain crisis.  Patient is opiate tolerant Initiate IV dilaudid PCA.  IV fluids, 0.45% saline at 100 ml/hr Toradol 15 mg IV times one dose Tylenol 1000 mg by mouth times one dose Review CBC with differential, complete metabolic panel, and reticulocytes as results become available.  Pain intensity will be reevaluated in context of functioning and relationship to baseline as care progresses If pain intensity remains elevated and/or sudden change in hemodynamic stability transition to inpatient services for higher level of care.     Nolon Nations  APRN, MSN, FNP-C [facility] [address] [place], [place] [address] [phone]  12/24/2022, 11:39 AM
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
## 2022-12-24 NOTE — Telephone Encounter (Signed)
Patient called requesting to come to the day hospital for sickle cell pain. Patient reports back pain rated 8/10. Reports taking Dilaudid 4 mg for pain at 5:00 am. Patient was treated in the ER for pain on yesterday. Admits to some nausea and vomiting but denies fever, chest pain, diarrhea and abdominal pain. Admits to having transportation without driving self. Per patient, her brother will be her transportation.  [place], FNP notified. Patient can come to the day hospital for pain management. Patient advised and expresses an understanding.

## 2022-12-24 NOTE — Telephone Encounter (Signed)
Medication needs to be changed. KH

## 2022-12-24 NOTE — Progress Notes (Signed)
Pt admitted to day hospital today for sickle cell pain treatment. On arrival, pt rates 9/10 pain to back. Pt received Dilaudid PCA and hydrated with IV fluids via PAC. At discharge, pt rates pain 6/10. PAC de-accessed and flushed with 0.9% Sodium Chloride and Heparin. AVS offered, but pt declined. Pt is alert, oriented, and ambulatory at discharge.

## 2022-12-25 DIAGNOSIS — Z7901 Long term (current) use of anticoagulants: Secondary | ICD-10-CM | POA: Diagnosis not present

## 2022-12-25 DIAGNOSIS — Z96649 Presence of unspecified artificial hip joint: Secondary | ICD-10-CM | POA: Diagnosis not present

## 2022-12-25 DIAGNOSIS — Z79899 Other long term (current) drug therapy: Secondary | ICD-10-CM | POA: Diagnosis not present

## 2022-12-25 DIAGNOSIS — D5700 Hb-SS disease with crisis, unspecified: Secondary | ICD-10-CM | POA: Diagnosis not present

## 2022-12-25 DIAGNOSIS — Z86711 Personal history of pulmonary embolism: Secondary | ICD-10-CM | POA: Diagnosis not present

## 2022-12-25 DIAGNOSIS — J45909 Unspecified asthma, uncomplicated: Secondary | ICD-10-CM | POA: Diagnosis not present

## 2022-12-25 DIAGNOSIS — Z79891 Long term (current) use of opiate analgesic: Secondary | ICD-10-CM | POA: Diagnosis not present

## 2022-12-25 DIAGNOSIS — Z5986 Financial insecurity: Secondary | ICD-10-CM | POA: Diagnosis not present

## 2022-12-25 DIAGNOSIS — Z5948 Other specified lack of adequate food: Secondary | ICD-10-CM | POA: Diagnosis not present

## 2022-12-26 DIAGNOSIS — D5700 Hb-SS disease with crisis, unspecified: Secondary | ICD-10-CM | POA: Diagnosis not present

## 2022-12-27 DIAGNOSIS — D5700 Hb-SS disease with crisis, unspecified: Secondary | ICD-10-CM | POA: Diagnosis not present

## 2022-12-28 DIAGNOSIS — D5700 Hb-SS disease with crisis, unspecified: Secondary | ICD-10-CM | POA: Diagnosis not present

## 2022-12-29 ENCOUNTER — Other Ambulatory Visit: Payer: Self-pay | Admitting: Family Medicine

## 2022-12-29 DIAGNOSIS — D5700 Hb-SS disease with crisis, unspecified: Secondary | ICD-10-CM

## 2022-12-29 MED ORDER — HYDROMORPHONE HCL 4 MG PO TABS
4.0000 mg | ORAL_TABLET | Freq: Four times a day (QID) | ORAL | 0 refills | Status: DC | PRN
Start: 2022-12-29 — End: 2023-01-20

## 2022-12-29 NOTE — Progress Notes (Signed)
Reviewed PDMP substance reporting system prior to prescribing opiate medications. No inconsistencies noted.   Meds ordered this encounter  Medications   HYDROmorphone (DILAUDID) 4 MG tablet    Sig: Take 1 tablet (4 mg total) by mouth every 6 (six) hours as needed (for pain).    Dispense:  60 tablet    Refill:  0    Order Specific Question:   Supervising Provider    Answer:   JEGEDE, OLUGBEMIGA E [[id number]]    Caeleb Batalla Moore Zainah Steven  APRN, MSN, FNP-C [facility] [address]  [place], [place] [address] [phone]  

## 2023-01-01 ENCOUNTER — Telehealth: Payer: Self-pay

## 2023-01-01 NOTE — Transitions of Care (Post Inpatient/ED Visit) (Signed)
   01/01/2023  Name: Tajai Suder MRN: [id number] DOB: 1980-08-23  Today's TOC FU Call Status: Today's TOC FU Call Status:: Unsuccessul Call (1st Attempt) Unsuccessful Call (1st Attempt) Date: 01/01/23  Attempted to reach the patient regarding the most recent Inpatient/ED visit.  Follow Up Plan: Additional outreach attempts will be made to reach the patient to complete the Transitions of Care (Post Inpatient/ED visit) call.   Signature   Woodfin Ganja LPN [facility] Nurse Health Advisor Direct Dial [phone]

## 2023-01-02 ENCOUNTER — Inpatient Hospital Stay: Payer: Self-pay | Admitting: Nurse Practitioner

## 2023-01-02 ENCOUNTER — Other Ambulatory Visit: Payer: Self-pay | Admitting: Nurse Practitioner

## 2023-01-02 NOTE — Transitions of Care (Post Inpatient/ED Visit) (Unsigned)
   01/02/2023  Name: Molly Gutierrez MRN: [id number] DOB: 1981-02-21  Today's TOC FU Call Status: Today's TOC FU Call Status:: Unsuccessful Call (2nd Attempt) Unsuccessful Call (1st Attempt) Date: 01/01/23 Unsuccessful Call (2nd Attempt) Date: 01/02/23  Attempted to reach the patient regarding the most recent Inpatient/ED visit.  Follow Up Plan: Additional outreach attempts will be made to reach the patient to complete the Transitions of Care (Post Inpatient/ED visit) call.   Signature   Woodfin Ganja LPN [facility] Nurse Health Advisor Direct Dial [phone]

## 2023-01-06 ENCOUNTER — Other Ambulatory Visit: Payer: Self-pay | Admitting: Nurse Practitioner

## 2023-01-06 NOTE — Transitions of Care (Post Inpatient/ED Visit) (Signed)
   01/06/2023  Name: Molly Gutierrez MRN: [id number] DOB: 1980-06-29  Today's TOC FU Call Status: Today's TOC FU Call Status:: Successful TOC FU Call Competed Unsuccessful Call (1st Attempt) Date: 01/01/23 Unsuccessful Call (2nd Attempt) Date: 01/02/23 Unsuccessful Call (3rd Attempt) Date: 01/06/23 [facility] FU Call Complete Date: 01/06/23  Attempted to reach the patient regarding the most recent Inpatient/ED visit.  Follow Up Plan: No further outreach attempts will be made at this time. We have been unable to contact the patient.  Signature   Woodfin Ganja LPN [facility] Nurse Health Advisor Direct Dial [phone]

## 2023-01-07 ENCOUNTER — Other Ambulatory Visit: Payer: Self-pay

## 2023-01-07 DIAGNOSIS — D5700 Hb-SS disease with crisis, unspecified: Secondary | ICD-10-CM | POA: Diagnosis not present

## 2023-01-07 DIAGNOSIS — D72829 Elevated white blood cell count, unspecified: Secondary | ICD-10-CM | POA: Diagnosis not present

## 2023-01-07 DIAGNOSIS — D649 Anemia, unspecified: Secondary | ICD-10-CM | POA: Diagnosis not present

## 2023-01-07 LAB — OXYCODONES,MS,WB/SP RFX
Oxycocone: NEGATIVE ng/mL
Oxycodones Confirmation: NEGATIVE
Oxymorphone: NEGATIVE ng/mL

## 2023-01-07 LAB — CMP14+CBC/D/PLT+FER+RETIC+V...
ALT: 12 IU/L (ref 0–32)
AST: 27 IU/L (ref 0–40)
Albumin: 4.6 g/dL (ref 3.9–4.9)
Alkaline Phosphatase: 72 IU/L (ref 44–121)
BUN/Creatinine Ratio: 9 (ref 9–23)
BUN: 5 mg/dL — ABNORMAL LOW (ref 6–24)
Basophils Absolute: 0.1 10*3/uL (ref 0.0–0.2)
Basos: 1 %
Bilirubin Total: 2.8 mg/dL — ABNORMAL HIGH (ref 0.0–1.2)
CO2: 22 mmol/L (ref 20–29)
Calcium: 9.4 mg/dL (ref 8.7–10.2)
Chloride: 102 mmol/L (ref 96–106)
Creatinine, Ser: 0.53 mg/dL — ABNORMAL LOW (ref 0.57–1.00)
EOS (ABSOLUTE): 0.2 10*3/uL (ref 0.0–0.4)
Eos: 1 %
Ferritin: 1316 ng/mL — ABNORMAL HIGH (ref 15–150)
Globulin, Total: 3.3 g/dL (ref 1.5–4.5)
Glucose: 85 mg/dL (ref 70–99)
Hematocrit: 24.4 % — ABNORMAL LOW (ref 34.0–46.6)
Hemoglobin: 7.9 g/dL — ABNORMAL LOW (ref 11.1–15.9)
Immature Grans (Abs): 0.1 10*3/uL (ref 0.0–0.1)
Immature Granulocytes: 1 %
Lymphocytes Absolute: 2.9 10*3/uL (ref 0.7–3.1)
Lymphs: 19 %
MCH: 30.5 pg (ref 26.6–33.0)
MCHC: 32.4 g/dL (ref 31.5–35.7)
MCV: 94 fL (ref 79–97)
Monocytes Absolute: 1.7 10*3/uL — ABNORMAL HIGH (ref 0.1–0.9)
Monocytes: 11 %
NRBC: 2 % — ABNORMAL HIGH (ref 0–0)
Neutrophils Absolute: 10.4 10*3/uL — ABNORMAL HIGH (ref 1.4–7.0)
Neutrophils: 67 %
Platelets: 554 10*3/uL — ABNORMAL HIGH (ref 150–450)
Potassium: 4.4 mmol/L (ref 3.5–5.2)
RBC: 2.59 x10E6/uL — CL (ref 3.77–5.28)
RDW: 18.7 % — ABNORMAL HIGH (ref 11.7–15.4)
Retic Ct Pct: 13.2 % — ABNORMAL HIGH (ref 0.6–2.6)
Sodium: 137 mmol/L (ref 134–144)
Total Protein: 7.9 g/dL (ref 6.0–8.5)
Vit D, 25-Hydroxy: 14.9 ng/mL — ABNORMAL LOW (ref 30.0–100.0)
WBC: 15.4 10*3/uL — ABNORMAL HIGH (ref 3.4–10.8)
eGFR: 119 mL/min/{1.73_m2} (ref >59)

## 2023-01-07 LAB — DRUG SCREEN 11 W/CONF, SE
Amphetamines, IA: NEGATIVE ng/mL
Barbiturates, IA: NEGATIVE ug/mL
Benzodiazepines, IA: NEGATIVE ng/mL
Cocaine & Metabolite, IA: NEGATIVE ng/mL
Ethyl Alcohol, Enz: NEGATIVE gm/dL
Methadone, IA: NEGATIVE ng/mL
Opiates, IA: POSITIVE ng/mL — AB
Oxycodones, IA: NEGATIVE ng/mL
Phencyclidine, IA: NEGATIVE ng/mL
Propoxyphene, IA: NEGATIVE ng/mL
THC(Marijuana) Metabolite, IA: NEGATIVE ng/mL

## 2023-01-07 LAB — OPIATES,MS,WB/SP RFX
6-Acetylmorphine: NEGATIVE
Codeine: NEGATIVE ng/mL
Dihydrocodeine: NEGATIVE ng/mL
Hydrocodone: NEGATIVE ng/mL
Hydromorphone: 1.3 ng/mL
Morphine: NEGATIVE ng/mL
Opiate Confirmation: POSITIVE

## 2023-01-07 NOTE — Telephone Encounter (Signed)
Please advise KH 

## 2023-01-15 DIAGNOSIS — Z8709 Personal history of other diseases of the respiratory system: Secondary | ICD-10-CM | POA: Diagnosis not present

## 2023-01-15 DIAGNOSIS — R0989 Other specified symptoms and signs involving the circulatory and respiratory systems: Secondary | ICD-10-CM | POA: Diagnosis not present

## 2023-01-15 DIAGNOSIS — R0789 Other chest pain: Secondary | ICD-10-CM | POA: Diagnosis not present

## 2023-01-15 DIAGNOSIS — Z7901 Long term (current) use of anticoagulants: Secondary | ICD-10-CM | POA: Diagnosis not present

## 2023-01-15 DIAGNOSIS — T402X5A Adverse effect of other opioids, initial encounter: Secondary | ICD-10-CM | POA: Diagnosis not present

## 2023-01-15 DIAGNOSIS — I517 Cardiomegaly: Secondary | ICD-10-CM | POA: Diagnosis not present

## 2023-01-15 DIAGNOSIS — Z86711 Personal history of pulmonary embolism: Secondary | ICD-10-CM | POA: Diagnosis not present

## 2023-01-15 DIAGNOSIS — I272 Pulmonary hypertension, unspecified: Secondary | ICD-10-CM | POA: Diagnosis not present

## 2023-01-15 DIAGNOSIS — L299 Pruritus, unspecified: Secondary | ICD-10-CM | POA: Diagnosis not present

## 2023-01-15 DIAGNOSIS — D5700 Hb-SS disease with crisis, unspecified: Secondary | ICD-10-CM | POA: Diagnosis not present

## 2023-01-15 DIAGNOSIS — Z7951 Long term (current) use of inhaled steroids: Secondary | ICD-10-CM | POA: Diagnosis not present

## 2023-01-15 DIAGNOSIS — J45909 Unspecified asthma, uncomplicated: Secondary | ICD-10-CM | POA: Diagnosis not present

## 2023-01-15 DIAGNOSIS — Z96649 Presence of unspecified artificial hip joint: Secondary | ICD-10-CM | POA: Diagnosis not present

## 2023-01-20 ENCOUNTER — Other Ambulatory Visit: Payer: Self-pay | Admitting: Nurse Practitioner

## 2023-01-20 DIAGNOSIS — D5700 Hb-SS disease with crisis, unspecified: Secondary | ICD-10-CM

## 2023-01-20 MED ORDER — HYDROMORPHONE HCL 4 MG PO TABS
4.0000 mg | ORAL_TABLET | Freq: Four times a day (QID) | ORAL | 0 refills | Status: DC | PRN
Start: 2023-01-21 — End: 2023-02-05

## 2023-01-21 ENCOUNTER — Telehealth: Payer: Self-pay

## 2023-01-21 DIAGNOSIS — R0789 Other chest pain: Secondary | ICD-10-CM | POA: Diagnosis not present

## 2023-01-21 DIAGNOSIS — D5700 Hb-SS disease with crisis, unspecified: Secondary | ICD-10-CM | POA: Diagnosis not present

## 2023-01-21 DIAGNOSIS — R079 Chest pain, unspecified: Secondary | ICD-10-CM | POA: Diagnosis not present

## 2023-01-21 DIAGNOSIS — Z20822 Contact with and (suspected) exposure to covid-19: Secondary | ICD-10-CM | POA: Diagnosis not present

## 2023-01-21 DIAGNOSIS — D72829 Elevated white blood cell count, unspecified: Secondary | ICD-10-CM | POA: Diagnosis not present

## 2023-01-21 NOTE — Transitions of Care (Post Inpatient/ED Visit) (Signed)
   01/21/2023  Name: Molly Gutierrez MRN: [id number] DOB: 1980/09/18  Today's TOC FU Call Status: Today's TOC FU Call Status:: Unsuccessful Call (1st Attempt) Unsuccessful Call (1st Attempt) Date: 01/21/23  Attempted to reach the patient regarding the most recent Inpatient/ED visit.  Follow Up Plan: Additional outreach attempts will be made to reach the patient to complete the Transitions of Care (Post Inpatient/ED visit) call.   Signature TB,CMA

## 2023-01-22 ENCOUNTER — Telehealth: Payer: Self-pay | Admitting: *Deleted

## 2023-01-22 ENCOUNTER — Encounter: Payer: Self-pay | Admitting: *Deleted

## 2023-01-22 ENCOUNTER — Other Ambulatory Visit: Payer: Self-pay | Admitting: Nurse Practitioner

## 2023-01-22 DIAGNOSIS — D5700 Hb-SS disease with crisis, unspecified: Secondary | ICD-10-CM

## 2023-01-22 NOTE — Transitions of Care (Post Inpatient/ED Visit) (Signed)
   01/22/2023  Name: Molly Gutierrez MRN: [id number] DOB: 1981-05-28  Today's TOC FU Call Status: Today's TOC FU Call Status:: Unsuccessful Call (1st Attempt) Unsuccessful Call (1st Attempt) Date: 01/22/23  Attempted to reach the patient regarding the most recent Inpatient visit; left HIPAA compliant voice message requesting call back  Follow Up Plan: Additional outreach attempts will be made to reach the patient to complete the Transitions of Care (Post Inpatient visit) call.   Caryl Pina, RN, BSN, CCRN Alumnus RN CM Care Coordination/ Transition of Care- [facility] Care Management [phone]: direct office

## 2023-01-23 ENCOUNTER — Ambulatory Visit: Payer: Self-pay | Admitting: Nurse Practitioner

## 2023-01-23 ENCOUNTER — Encounter: Payer: Self-pay | Admitting: *Deleted

## 2023-01-23 ENCOUNTER — Telehealth: Payer: Self-pay | Admitting: *Deleted
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
## 2023-01-27 ENCOUNTER — Other Ambulatory Visit: Payer: Self-pay | Admitting: Nurse Practitioner

## 2023-01-27 DIAGNOSIS — D5700 Hb-SS disease with crisis, unspecified: Secondary | ICD-10-CM

## 2023-01-27 NOTE — Telephone Encounter (Signed)
Please advise Kh 

## 2023-01-29 DIAGNOSIS — M542 Cervicalgia: Secondary | ICD-10-CM | POA: Diagnosis not present

## 2023-01-29 DIAGNOSIS — M25512 Pain in left shoulder: Secondary | ICD-10-CM | POA: Diagnosis not present

## 2023-01-29 DIAGNOSIS — Z79899 Other long term (current) drug therapy: Secondary | ICD-10-CM | POA: Diagnosis not present

## 2023-01-29 DIAGNOSIS — D571 Sickle-cell disease without crisis: Secondary | ICD-10-CM | POA: Diagnosis not present

## 2023-01-29 DIAGNOSIS — J45909 Unspecified asthma, uncomplicated: Secondary | ICD-10-CM | POA: Diagnosis not present

## 2023-01-29 DIAGNOSIS — R112 Nausea with vomiting, unspecified: Secondary | ICD-10-CM | POA: Diagnosis not present

## 2023-01-29 DIAGNOSIS — M546 Pain in thoracic spine: Secondary | ICD-10-CM | POA: Diagnosis not present

## 2023-01-29 DIAGNOSIS — D5700 Hb-SS disease with crisis, unspecified: Secondary | ICD-10-CM | POA: Diagnosis not present

## 2023-01-29 DIAGNOSIS — Z86711 Personal history of pulmonary embolism: Secondary | ICD-10-CM | POA: Diagnosis not present

## 2023-01-29 DIAGNOSIS — I517 Cardiomegaly: Secondary | ICD-10-CM | POA: Diagnosis not present

## 2023-01-29 DIAGNOSIS — M25511 Pain in right shoulder: Secondary | ICD-10-CM | POA: Diagnosis not present

## 2023-01-29 DIAGNOSIS — J4520 Mild intermittent asthma, uncomplicated: Secondary | ICD-10-CM | POA: Diagnosis not present

## 2023-01-29 DIAGNOSIS — D72825 Bandemia: Secondary | ICD-10-CM | POA: Diagnosis not present

## 2023-01-29 DIAGNOSIS — Z96649 Presence of unspecified artificial hip joint: Secondary | ICD-10-CM | POA: Diagnosis not present

## 2023-01-29 DIAGNOSIS — M549 Dorsalgia, unspecified: Secondary | ICD-10-CM | POA: Diagnosis not present

## 2023-01-29 DIAGNOSIS — I272 Pulmonary hypertension, unspecified: Secondary | ICD-10-CM | POA: Diagnosis not present

## 2023-02-05 ENCOUNTER — Telehealth: Payer: Self-pay | Admitting: *Deleted

## 2023-02-05 ENCOUNTER — Other Ambulatory Visit: Payer: Self-pay | Admitting: Nurse Practitioner

## 2023-02-05 ENCOUNTER — Encounter: Payer: Self-pay | Admitting: *Deleted

## 2023-02-05 DIAGNOSIS — D5700 Hb-SS disease with crisis, unspecified: Secondary | ICD-10-CM

## 2023-02-05 MED ORDER — HYDROMORPHONE HCL 4 MG PO TABS
4.0000 mg | ORAL_TABLET | Freq: Four times a day (QID) | ORAL | 0 refills | Status: DC | PRN
Start: 2023-02-05 — End: 2023-02-20

## 2023-02-05 MED ORDER — DEFERASIROX 360 MG PO TABS: 1080.0000 mg | ORAL_TABLET | Freq: Every day | ORAL | 0 refills | Status: DC

## 2023-02-05 MED ORDER — OXBRYTA 500 MG PO TABS: 1500.0000 mg | ORAL_TABLET | Freq: Every day | ORAL | 0 refills | Status: DC

## 2023-02-05 NOTE — Transitions of Care (Post Inpatient/ED Visit) (Signed)
   02/05/2023  Name: Molly Gutierrez MRN: [id number] DOB: December 01, 1980  Today's TOC FU Call Status: Today's TOC FU Call Status:: Unsuccessful Call (1st Attempt) Unsuccessful Call (1st Attempt) Date: 02/05/23  Attempted to reach the patient regarding the most recent Inpatient visit; left HIPAA compliant voice message requesting call back  Follow Up Plan: Additional outreach attempts will be made to reach the patient to complete the Transitions of Care (Post Inpatient visit) call.   Caryl Pina, RN, BSN, CCRN Alumnus RN CM Care Coordination/ Transition of Care- [facility] Care Management [phone]: direct office

## 2023-02-06 ENCOUNTER — Encounter: Payer: Self-pay | Admitting: *Deleted

## 2023-02-06 ENCOUNTER — Telehealth: Payer: Self-pay | Admitting: *Deleted

## 2023-02-06 NOTE — Transitions of Care (Post Inpatient/ED Visit) (Signed)
   02/06/2023  Name: Molly Gutierrez MRN: [id number] DOB: 08-Dec-1980  Today's TOC FU Call Status: Today's TOC FU Call Status:: Unsuccessful Call (2nd Attempt) Unsuccessful Call (2nd Attempt) Date: 02/06/23  Attempted to reach the patient regarding the most recent Inpatient visit; Received automated outgoing message stating, "your call cannot be completed at this time; please hang up and try your call again later;" unable to leave voice message requesting call back   Follow Up Plan: Additional outreach attempts will be made to reach the patient to complete the Transitions of Care (Post Inpatient visit) call.   Caryl Pina, RN, BSN, CCRN Alumnus RN CM Care Coordination/ Transition of Care- [facility] Care Management [phone]: direct office

## 2023-02-07 ENCOUNTER — Encounter: Payer: Self-pay | Admitting: *Deleted

## 2023-02-07 ENCOUNTER — Telehealth: Payer: Self-pay | Admitting: *Deleted

## 2023-02-07 NOTE — Transitions of Care (Post Inpatient/ED Visit) (Signed)
   02/07/2023  Name: Molly Gutierrez MRN: [id number] DOB: 01-11-1981  Today's TOC FU Call Status: Today's TOC FU Call Status:: Unsuccessful Call (3rd Attempt) Unsuccessful Call (3rd Attempt) Date: 02/07/23  Attempted to reach the patient regarding the most recent Inpatient visit; Received automated outgoing message stating, "your call cannot be completed at this time, please hang up and try your call again later;" unable to leave voice message requesting call back   Follow Up Plan: No further outreach attempts will be made at this time. We have been unable to contact the patient.  Caryl Pina, RN, BSN, CCRN Alumnus RN CM Care Coordination/ Transition of Care- [facility] Care Management [phone]: direct office

## 2023-02-08 DIAGNOSIS — M79605 Pain in left leg: Secondary | ICD-10-CM | POA: Diagnosis not present

## 2023-02-08 DIAGNOSIS — D571 Sickle-cell disease without crisis: Secondary | ICD-10-CM | POA: Diagnosis not present

## 2023-02-08 DIAGNOSIS — M79604 Pain in right leg: Secondary | ICD-10-CM | POA: Diagnosis not present

## 2023-02-16 DIAGNOSIS — D5700 Hb-SS disease with crisis, unspecified: Secondary | ICD-10-CM | POA: Diagnosis not present

## 2023-02-16 DIAGNOSIS — R0789 Other chest pain: Secondary | ICD-10-CM | POA: Diagnosis not present

## 2023-02-16 DIAGNOSIS — R079 Chest pain, unspecified: Secondary | ICD-10-CM | POA: Diagnosis not present

## 2023-02-16 DIAGNOSIS — I272 Pulmonary hypertension, unspecified: Secondary | ICD-10-CM | POA: Diagnosis not present

## 2023-02-16 DIAGNOSIS — I517 Cardiomegaly: Secondary | ICD-10-CM | POA: Diagnosis not present

## 2023-02-16 DIAGNOSIS — Z86711 Personal history of pulmonary embolism: Secondary | ICD-10-CM | POA: Diagnosis not present

## 2023-02-16 DIAGNOSIS — J159 Unspecified bacterial pneumonia: Secondary | ICD-10-CM | POA: Diagnosis not present

## 2023-02-16 DIAGNOSIS — Z7901 Long term (current) use of anticoagulants: Secondary | ICD-10-CM | POA: Diagnosis not present

## 2023-02-16 DIAGNOSIS — D571 Sickle-cell disease without crisis: Secondary | ICD-10-CM | POA: Diagnosis not present

## 2023-02-20 ENCOUNTER — Other Ambulatory Visit: Payer: Self-pay | Admitting: Nurse Practitioner

## 2023-02-20 DIAGNOSIS — D5700 Hb-SS disease with crisis, unspecified: Secondary | ICD-10-CM

## 2023-02-20 MED ORDER — HYDROMORPHONE HCL 4 MG PO TABS: 4.0000 mg | ORAL_TABLET | Freq: Four times a day (QID) | ORAL | 0 refills | Status: DC | PRN

## 2023-02-26 ENCOUNTER — Telehealth: Payer: Self-pay

## 2023-02-26 NOTE — Transitions of Care (Post Inpatient/ED Visit) (Signed)
   02/26/2023  Name: Sadeem Collaso MRN: [id number] DOB: Aug 05, 1980  Today's TOC FU Call Status: Today's TOC FU Call Status:: Unsuccessful Call (1st Attempt) Unsuccessful Call (1st Attempt) Date: 02/26/23  Attempted to reach the patient regarding the most recent Inpatient/ED visit.  Follow Up Plan: Additional outreach attempts will be made to reach the patient to complete the Transitions of Care (Post Inpatient/ED visit) call.   Jodelle Gross RN, BSN, CCM [facility] RN Care Coordinator/ Transitions of Care Direct Dial: [phone]  Fax: [phone]

## 2023-03-03 DIAGNOSIS — J8489 Other specified interstitial pulmonary diseases: Secondary | ICD-10-CM | POA: Diagnosis not present

## 2023-03-03 DIAGNOSIS — R0789 Other chest pain: Secondary | ICD-10-CM | POA: Diagnosis not present

## 2023-03-03 DIAGNOSIS — I4581 Long QT syndrome: Secondary | ICD-10-CM | POA: Diagnosis not present

## 2023-03-03 DIAGNOSIS — R918 Other nonspecific abnormal finding of lung field: Secondary | ICD-10-CM | POA: Diagnosis not present

## 2023-03-03 DIAGNOSIS — D57219 Sickle-cell/Hb-C disease with crisis, unspecified: Secondary | ICD-10-CM | POA: Diagnosis not present

## 2023-03-03 DIAGNOSIS — R079 Chest pain, unspecified: Secondary | ICD-10-CM | POA: Diagnosis not present

## 2023-03-03 DIAGNOSIS — M791 Myalgia, unspecified site: Secondary | ICD-10-CM | POA: Diagnosis not present

## 2023-03-03 DIAGNOSIS — D5700 Hb-SS disease with crisis, unspecified: Secondary | ICD-10-CM | POA: Diagnosis not present

## 2023-03-04 ENCOUNTER — Other Ambulatory Visit: Payer: Self-pay

## 2023-03-04 ENCOUNTER — Encounter (HOSPITAL_COMMUNITY): Payer: Self-pay

## 2023-03-04 ENCOUNTER — Inpatient Hospital Stay (HOSPITAL_COMMUNITY)
Admission: EM | Admit: 2023-03-04 | Discharge: 2023-03-09 | DRG: 812 | Disposition: A | Discharge status: Home / Self Care | Payer: Medicare HMO | Attending: Internal Medicine | Admitting: Internal Medicine

## 2023-03-04 DIAGNOSIS — Z23 Encounter for immunization: Secondary | ICD-10-CM | POA: Diagnosis not present

## 2023-03-04 DIAGNOSIS — F1120 Opioid dependence, uncomplicated: Secondary | ICD-10-CM | POA: Diagnosis not present

## 2023-03-04 DIAGNOSIS — Z7951 Long term (current) use of inhaled steroids: Secondary | ICD-10-CM | POA: Diagnosis not present

## 2023-03-04 DIAGNOSIS — Z881 Allergy status to other antibiotic agents status: Secondary | ICD-10-CM | POA: Diagnosis not present

## 2023-03-04 DIAGNOSIS — E876 Hypokalemia: Secondary | ICD-10-CM | POA: Diagnosis present

## 2023-03-04 DIAGNOSIS — Z79899 Other long term (current) drug therapy: Secondary | ICD-10-CM

## 2023-03-04 DIAGNOSIS — J4520 Mild intermittent asthma, uncomplicated: Secondary | ICD-10-CM | POA: Diagnosis not present

## 2023-03-04 DIAGNOSIS — Z7901 Long term (current) use of anticoagulants: Secondary | ICD-10-CM

## 2023-03-04 DIAGNOSIS — J8489 Other specified interstitial pulmonary diseases: Secondary | ICD-10-CM | POA: Diagnosis not present

## 2023-03-04 DIAGNOSIS — D57219 Sickle-cell/Hb-C disease with crisis, unspecified: Secondary | ICD-10-CM | POA: Diagnosis not present

## 2023-03-04 DIAGNOSIS — Z884 Allergy status to anesthetic agent status: Secondary | ICD-10-CM

## 2023-03-04 DIAGNOSIS — G894 Chronic pain syndrome: Secondary | ICD-10-CM | POA: Diagnosis not present

## 2023-03-04 DIAGNOSIS — Z888 Allergy status to other drugs, medicaments and biological substances status: Secondary | ICD-10-CM | POA: Diagnosis not present

## 2023-03-04 DIAGNOSIS — Z86711 Personal history of pulmonary embolism: Secondary | ICD-10-CM | POA: Diagnosis not present

## 2023-03-04 DIAGNOSIS — D72829 Elevated white blood cell count, unspecified: Secondary | ICD-10-CM | POA: Diagnosis present

## 2023-03-04 DIAGNOSIS — M545 Low back pain, unspecified: Secondary | ICD-10-CM | POA: Diagnosis present

## 2023-03-04 DIAGNOSIS — E860 Dehydration: Secondary | ICD-10-CM | POA: Diagnosis present

## 2023-03-04 DIAGNOSIS — D5700 Hb-SS disease with crisis, unspecified: Principal | ICD-10-CM | POA: Diagnosis present

## 2023-03-04 DIAGNOSIS — R079 Chest pain, unspecified: Secondary | ICD-10-CM | POA: Diagnosis not present

## 2023-03-04 DIAGNOSIS — N179 Acute kidney failure, unspecified: Secondary | ICD-10-CM | POA: Diagnosis not present

## 2023-03-04 LAB — COMPREHENSIVE METABOLIC PANEL
ALT: 14 U/L (ref 0–44)
AST: 35 U/L (ref 15–41)
Albumin: 4.5 g/dL (ref 3.5–5.0)
Alkaline Phosphatase: 50 U/L (ref 38–126)
Anion gap: 8 (ref 5–15)
BUN: 11 mg/dL (ref 6–20)
CO2: 24 mmol/L (ref 22–32)
Calcium: 8.7 mg/dL — ABNORMAL LOW (ref 8.9–10.3)
Chloride: 104 mmol/L (ref 98–111)
Creatinine, Ser: 0.56 mg/dL (ref 0.44–1.00)
GFR, Estimated: >60 mL/min (ref >60)
Glucose, Bld: 95 mg/dL (ref 70–99)
Potassium: 3.3 mmol/L — ABNORMAL LOW (ref 3.5–5.1)
Sodium: 136 mmol/L (ref 135–145)
Total Bilirubin: 3.5 mg/dL — ABNORMAL HIGH (ref 0.3–1.2)
Total Protein: 8.3 g/dL — ABNORMAL HIGH (ref 6.5–8.1)

## 2023-03-04 LAB — CBC WITH DIFFERENTIAL/PLATELET
Abs Immature Granulocytes: 0.07 10*3/uL (ref 0.00–0.07)
Basophils Absolute: 0.1 10*3/uL (ref 0.0–0.1)
Basophils Relative: 0 %
Eosinophils Absolute: 0.2 10*3/uL (ref 0.0–0.5)
Eosinophils Relative: 1 %
HCT: 21.6 % — ABNORMAL LOW (ref 36.0–46.0)
Hemoglobin: 7.5 g/dL — ABNORMAL LOW (ref 12.0–15.0)
Immature Granulocytes: 1 %
Lymphocytes Relative: 20 %
Lymphs Abs: 3 10*3/uL (ref 0.7–4.0)
MCH: 30.9 pg (ref 26.0–34.0)
MCHC: 34.7 g/dL (ref 30.0–36.0)
MCV: 88.9 fL (ref 80.0–100.0)
Monocytes Absolute: 2.3 10*3/uL — ABNORMAL HIGH (ref 0.1–1.0)
Monocytes Relative: 15 %
Neutro Abs: 9.7 10*3/uL — ABNORMAL HIGH (ref 1.7–7.7)
Neutrophils Relative %: 63 %
Platelets: 488 10*3/uL — ABNORMAL HIGH (ref 150–400)
RBC: 2.43 MIL/uL — ABNORMAL LOW (ref 3.87–5.11)
RDW: 17.8 % — ABNORMAL HIGH (ref 11.5–15.5)
WBC: 15.4 10*3/uL — ABNORMAL HIGH (ref 4.0–10.5)
nRBC: 0.2 % (ref 0.0–0.2)

## 2023-03-04 LAB — RETICULOCYTES
Immature Retic Fract: 11.2 % (ref 2.3–15.9)
RBC.: 2.43 MIL/uL — ABNORMAL LOW (ref 3.87–5.11)
Retic Count, Absolute: 290.5 10*3/uL — ABNORMAL HIGH (ref 19.0–186.0)
Retic Ct Pct: 11.6 % — ABNORMAL HIGH (ref 0.4–3.1)

## 2023-03-04 LAB — LACTATE DEHYDROGENASE: LDH: 387 U/L — ABNORMAL HIGH (ref 98–192)

## 2023-03-04 MED ORDER — MIRTAZAPINE 30 MG PO TABS
45.0000 mg | ORAL_TABLET | Freq: Every day | ORAL | Status: DC
  Administered 2023-03-04 – 2023-03-08 (×5): 45 mg via ORAL
  Filled 2023-03-04 (×5): qty 1

## 2023-03-04 MED ORDER — ONDANSETRON HCL 4 MG/2ML IJ SOLN
4.0000 mg | Freq: Once | INTRAMUSCULAR | Status: AC
  Administered 2023-03-04: 4 mg via INTRAVENOUS
  Filled 2023-03-04: qty 2

## 2023-03-04 MED ORDER — SENNOSIDES-DOCUSATE SODIUM 8.6-50 MG PO TABS
1.0000 | ORAL_TABLET | Freq: Two times a day (BID) | ORAL | Status: DC
  Administered 2023-03-04 – 2023-03-09 (×10): 1 via ORAL
  Filled 2023-03-04 (×10): qty 1

## 2023-03-04 MED ORDER — HYDROMORPHONE HCL 2 MG/ML IJ SOLN
2.0000 mg | Freq: Once | INTRAMUSCULAR | Status: AC
  Administered 2023-03-04: 2 mg via INTRAVENOUS
  Filled 2023-03-04: qty 1

## 2023-03-04 MED ORDER — ACETAMINOPHEN 500 MG PO TABS
1000.0000 mg | ORAL_TABLET | Freq: Once | ORAL | Status: AC
  Administered 2023-03-04: 1000 mg via ORAL
  Filled 2023-03-04: qty 2

## 2023-03-04 MED ORDER — RIVAROXABAN 20 MG PO TABS
20.0000 mg | ORAL_TABLET | Freq: Every day | ORAL | Status: DC
  Administered 2023-03-04 – 2023-03-08 (×5): 20 mg via ORAL
  Filled 2023-03-04 (×5): qty 1

## 2023-03-04 MED ORDER — SODIUM CHLORIDE 0.9 % IV SOLN
25.0000 mg | Freq: Four times a day (QID) | INTRAVENOUS | Status: AC | PRN
  Administered 2023-03-04 – 2023-03-05 (×4): 25 mg via INTRAVENOUS
  Filled 2023-03-04: qty 0.5
  Filled 2023-03-04 (×4): qty 25

## 2023-03-04 MED ORDER — INFLUENZA VIRUS VACC SPLIT PF (FLUZONE) 0.5 ML IM SUSY
0.5000 mL | PREFILLED_SYRINGE | INTRAMUSCULAR | Status: AC
  Administered 2023-03-05: 0.5 mL via INTRAMUSCULAR
  Filled 2023-03-04: qty 0.5

## 2023-03-04 MED ORDER — ONDANSETRON HCL 4 MG/2ML IJ SOLN: 4.0000 mg | Freq: Four times a day (QID) | INTRAMUSCULAR | Status: DC | PRN

## 2023-03-04 MED ORDER — HYDROMORPHONE 1 MG/ML IV SOLN
INTRAVENOUS | Status: DC
  Administered 2023-03-04: 2.5 mg via INTRAVENOUS
  Administered 2023-03-04: 30 mg via INTRAVENOUS
  Administered 2023-03-05: 4.5 mg via INTRAVENOUS
  Administered 2023-03-05: 30 mg via INTRAVENOUS
  Administered 2023-03-05 (×3): 3.5 mg via INTRAVENOUS
  Administered 2023-03-05: 0.5 mg via INTRAVENOUS
  Administered 2023-03-05: 6 mg via INTRAVENOUS
  Administered 2023-03-06: 8.5 mg via INTRAVENOUS
  Administered 2023-03-06: 6 mg via INTRAVENOUS
  Filled 2023-03-04 (×2): qty 30

## 2023-03-04 MED ORDER — HYDROMORPHONE HCL 2 MG/ML IJ SOLN: 2.0000 mg | INTRAMUSCULAR | Status: DC | PRN

## 2023-03-04 MED ORDER — NALOXONE HCL 0.4 MG/ML IJ SOLN: 0.4000 mg | INTRAMUSCULAR | Status: DC | PRN

## 2023-03-04 MED ORDER — POTASSIUM CHLORIDE CRYS ER 20 MEQ PO TBCR
20.0000 meq | EXTENDED_RELEASE_TABLET | Freq: Once | ORAL | Status: AC
  Administered 2023-03-04: 20 meq via ORAL
  Filled 2023-03-04: qty 1

## 2023-03-04 MED ORDER — SODIUM CHLORIDE 0.9% FLUSH: 9.0000 mL | INTRAVENOUS | Status: DC | PRN

## 2023-03-04 MED ORDER — POLYETHYLENE GLYCOL 3350 17 G PO PACK: 17.0000 g | PACK | Freq: Every day | ORAL | Status: DC | PRN

## 2023-03-04 MED ORDER — DIPHENHYDRAMINE HCL 25 MG PO CAPS
25.0000 mg | ORAL_CAPSULE | ORAL | Status: DC | PRN
  Administered 2023-03-06 – 2023-03-08 (×4): 25 mg via ORAL
  Filled 2023-03-04 (×5): qty 1

## 2023-03-04 MED ORDER — KETOROLAC TROMETHAMINE 15 MG/ML IJ SOLN
15.0000 mg | Freq: Once | INTRAMUSCULAR | Status: AC
  Administered 2023-03-04: 15 mg via INTRAVENOUS
  Filled 2023-03-04: qty 1

## 2023-03-04 MED ORDER — MOMETASONE FURO-FORMOTEROL FUM 100-5 MCG/ACT IN AERO
2.0000 | INHALATION_SPRAY | Freq: Two times a day (BID) | RESPIRATORY_TRACT | Status: DC
  Administered 2023-03-05 – 2023-03-09 (×7): 2 via RESPIRATORY_TRACT
  Filled 2023-03-04: qty 8.8

## 2023-03-04 MED ORDER — VITAMIN D 25 MCG (1000 UNIT) PO TABS
1000.0000 [IU] | ORAL_TABLET | Freq: Every day | ORAL | Status: DC
  Administered 2023-03-04 – 2023-03-08 (×5): 1000 [IU] via ORAL
  Filled 2023-03-04 (×5): qty 1

## 2023-03-04 MED ORDER — DIPHENHYDRAMINE HCL 50 MG/ML IJ SOLN
25.0000 mg | Freq: Four times a day (QID) | INTRAMUSCULAR | Status: DC | PRN
  Administered 2023-03-04 (×2): 25 mg via INTRAVENOUS
  Filled 2023-03-04: qty 1

## 2023-03-04 MED ORDER — KETOROLAC TROMETHAMINE 15 MG/ML IJ SOLN
15.0000 mg | Freq: Four times a day (QID) | INTRAMUSCULAR | Status: DC
  Administered 2023-03-04 – 2023-03-05 (×3): 15 mg via INTRAVENOUS
  Filled 2023-03-04 (×3): qty 1

## 2023-03-04 MED ORDER — FOLIC ACID 1 MG PO TABS
1.0000 mg | ORAL_TABLET | Freq: Every day | ORAL | Status: DC
  Administered 2023-03-05 – 2023-03-09 (×5): 1 mg via ORAL
  Filled 2023-03-04 (×5): qty 1

## 2023-03-04 MED ORDER — ACETAMINOPHEN 325 MG PO TABS
650.0000 mg | ORAL_TABLET | Freq: Four times a day (QID) | ORAL | Status: DC | PRN
  Filled 2023-03-04: qty 2

## 2023-03-04 MED ORDER — DIPHENHYDRAMINE HCL 50 MG/ML IJ SOLN
25.0000 mg | Freq: Once | INTRAMUSCULAR | Status: DC
  Filled 2023-03-04: qty 1

## 2023-03-04 NOTE — H&P (Signed)
H&P  Patient Demographics:  Molly Gutierrez, is a 42 y.o. female  MRN: [id number]   DOB - 10-15-1980  Admit Date - 03/04/2023  Outpatient Primary MD for the patient is [name], NP  Chief Complaint  Patient presents with  . Sickle Cell Pain Crisis      HPI:   Molly Gutierrez  is a 42 y.o. female with a medical history significant for sickle cell disease, chronic pain syndrome, opiate dependence and tolerance, history of PE on Xarelto, history of mild intermittent asthma, and history of anemia of chronic disease presents with complaints of pain to chest, low back, and lower extremities.  Patient's pain is consistent with previous sickle cell pain crisis.  Patient attributes this crisis to changes in weather.  She states that she has been attempting to manage her pain at home without success.  Patient typically takes Dilaudid 4 mg around every 6 hours as needed for pain, which was essentially ineffective.  She currently rates pain as 7/10, constant, and aching.  Patient says that her chest pain is improved since ED treatment, however pain to low back and lower extremities persists.  Patient denies fever, chills, chest pain, headache, or dizziness.  No urinary symptoms, nausea, vomiting, or diarrhea.  No sick contacts, recent travel, or known exposure to COVID-19.  ER course:    Review of systems:  Review of Systems  Constitutional:  Positive for malaise/fatigue. Negative for chills and fever.  HENT: Negative.    Eyes: Negative.   Respiratory: Negative.    Cardiovascular: Negative.   Gastrointestinal: Negative.   Genitourinary: Negative.   Musculoskeletal:  Positive for back pain and joint pain.  Skin:  Positive for itching. Negative for rash.  Neurological: Negative.   Endo/Heme/Allergies: Negative.   Psychiatric/Behavioral: Negative.     With Past History of the following :   Past Medical History:  Diagnosis Date  . Asthma   . Eczema   . History of pulmonary  embolus (PE)   . Sickle cell anemia (HCC)       Past Surgical History:  Procedure Laterality Date  . CHOLECYSTECTOMY    . ERCP    . JOINT REPLACEMENT    . PORTA CATH INSERTION    . TUBAL LIGATION    . WISDOM TOOTH EXTRACTION       Social History:   Social History   Tobacco Use  . Smoking status: Never  . Smokeless tobacco: Never  Substance Use Topics  . Alcohol use: Never     Lives - At home   Family History :   Family History  Problem Relation Age of Onset  . Renal Disease Mother   . Hypertension Mother   . High Cholesterol Mother   . Heart attack Mother   . Diabetes Brother      Home Medications:   Prior to Admission medications   Medication Sig Start Date End Date Taking? Authorizing Provider  acetaminophen (TYLENOL) 500 MG tablet Take 500 mg by mouth every 6 (six) hours as needed for mild pain or headache (or cramps).    [provider]  albuterol (VENTOLIN HFA) 108 (90 Base) MCG/ACT inhaler TAKE 2 PUFFS BY MOUTH EVERY 6 HOURS AS NEEDED FOR WHEEZE OR SHORTNESS OF BREATH 01/22/23   [name], NP  budesonide-formoterol (SYMBICORT) 80-4.5 MCG/ACT inhaler INHALE 2 PUFFS INTO THE LUNGS TWICE A DAY 01/27/23   [name], NP  Cholecalciferol (VITAMIN D3 PO) Take 1 capsule by mouth at bedtime.  [provider]  Cyanocobalamin (VITAMIN B-12 PO) Take 1 tablet by mouth at bedtime.    [provider]  Deferasirox (JADENU) 360 MG TABS Take 3 tablets (1,080 mg total) by mouth daily. 02/05/23   [name], NP  diphenhydrAMINE (BENADRYL) 25 MG tablet Take 25 mg by mouth every 4 (four) hours as needed for itching (with hydromorphone dose).    [provider]  fluticasone (FLONASE) 50 MCG/ACT nasal spray Place 1 spray into both nostrils daily as needed for allergies or rhinitis. Patient not taking: Reported on 08/19/2022 12/31/21 04/02/22  [name], NP  folic acid (FOLVITE) 1 MG tablet Take 1 tablet (1 mg total) by mouth  daily. 12/23/22   [name], NP  HYDROmorphone (DILAUDID) 4 MG tablet Take 1 tablet (4 mg total) by mouth every 6 (six) hours as needed (for pain). 02/22/23   [name], NP  mirtazapine (REMERON) 45 MG tablet Take 1 tablet (45 mg total) by mouth at bedtime. 12/23/22 12/23/23  [name], NP  naloxone [facility]) nasal spray 4 mg/0.1 mL Place 0.1 sprays (0.4 mg total) into the nose once as needed (opioid overdose). 12/23/22   [name], NP  OXBRYTA 500 MG TABS tablet Take 1,500 mg by mouth daily. 02/05/23   [name], NP  polyethylene glycol (MIRALAX / GLYCOLAX) 17 g packet Take 17 g by mouth daily as needed for mild constipation (MIX AS DIRECTED AND DRINK). 12/23/22   [name], NP  promethazine (PHENERGAN) 25 MG tablet Take 0.5-1 tablets (12.5-25 mg total) by mouth every 6 (six) hours as needed for nausea or vomiting. Patient taking differently: Take 25 mg by mouth every 4 (four) hours as needed for nausea or vomiting (with hydromorphone dose). 10/31/21 12/23/22  [name], NP  Vitamin D, Ergocalciferol, (DRISDOL) 1.25 MG (50000 UNIT) CAPS capsule Take 1 capsule (50,000 Units total) by mouth once a week. 12/23/22 12/23/23  [name], NP  XARELTO 20 MG TABS tablet Take 1 tablet (20 mg total) by mouth daily with supper. Patient taking differently: Take 20 mg by mouth at bedtime. 10/29/21 12/23/22  [name], NP     Allergies:   Allergies  Allergen Reactions  . Cefaclor Hives and Swelling  . Hydroxyurea Palpitations and Other (See Comments)    Lowers "blood levels" and heart rate (causes HYPOtension); "it messes me up, it drops my levels and stuff"   . Omeprazole Other (See Comments)    Causes "sharp pains in the stomach"  . Ketamine Palpitations and Other (See Comments)    "Pt states she has had previous reaction to ketamine. States she becomes flushed, heart races, dizzy, and feels like she is going to pass out."     Physical Exam:   Vitals:    Vitals:   03/04/23 1300 03/04/23 1456  BP: 109/79   Pulse: 77   Resp: 18   Temp:  98.4 F (36.9 C)  SpO2: 96%     Physical Exam: Constitutional: Patient appears well-developed and well-nourished. Not in obvious distress. HENT: Normocephalic, atraumatic, External right and left ear normal. Oropharynx is clear and moist.  Eyes: Conjunctivae and EOM are normal. PERRLA, no scleral icterus. Neck: Normal ROM. Neck supple. No JVD. No tracheal deviation. No thyromegaly. CVS: RRR, S1/S2 +, no murmurs, no gallops, no carotid bruit.  Pulmonary: Effort and breath sounds normal, no stridor, rhonchi, wheezes, rales.  Abdominal: Soft. BS +, no distension, tenderness, rebound or  guarding.  Musculoskeletal: Normal range of motion. No edema and no tenderness.  Lymphadenopathy: No lymphadenopathy noted, cervical, inguinal or axillary Neuro: Alert. Normal reflexes, muscle tone coordination. No cranial nerve deficit. Skin: Skin is warm and dry. No rash noted. Not diaphoretic. No erythema. No pallor. Psychiatric: Normal mood and affect. Behavior, judgment, thought content normal.   Data Review:   CBC Recent Labs  Lab 03/04/23 1146  WBC 15.4*  HGB 7.5*  HCT 21.6*  PLT 488*  MCV 88.9  MCH 30.9  MCHC 34.7  RDW 17.8*  LYMPHSABS 3.0  MONOABS 2.3*  EOSABS 0.2  BASOSABS 0.1   ------------------------------------------------------------------------------------------------------------------  Chemistries  Recent Labs  Lab 03/04/23 1146  NA 136  K 3.3*  CL 104  CO2 24  GLUCOSE 95  BUN 11  CREATININE 0.56  CALCIUM 8.7*  AST 35  ALT 14  ALKPHOS 50  BILITOT 3.5*   ------------------------------------------------------------------------------------------------------------------ estimated creatinine clearance is 73.6 mL/min (by C-G formula based on SCr of 0.56 mg/dL). ------------------------------------------------------------------------------------------------------------------ No  results for input(s): "TSH", "T4TOTAL", "T3FREE", "THYROIDAB" in the last 72 hours.  Invalid input(s): "FREET3"  Coagulation profile No results for input(s): "INR", "PROTIME" in the last 168 hours. ------------------------------------------------------------------------------------------------------------------- No results for input(s): "DDIMER" in the last 72 hours. -------------------------------------------------------------------------------------------------------------------  Cardiac Enzymes No results for input(s): "CKMB", "TROPONINI", "MYOGLOBIN" in the last 168 hours.  Invalid input(s): "CK" ------------------------------------------------------------------------------------------------------------------    Component Value Date/Time   BNP 70.6 05/05/2019 0204    ---------------------------------------------------------------------------------------------------------------  Urinalysis    Component Value Date/Time   COLORURINE YELLOW 12/11/2021 2103   APPEARANCEUR CLEAR 12/11/2021 2103   LABSPEC 1.011 12/11/2021 2103   PHURINE 5.0 12/11/2021 2103   GLUCOSEU NEGATIVE 12/11/2021 2103   HGBUR NEGATIVE 12/11/2021 2103   BILIRUBINUR NEGATIVE 12/11/2021 2103   BILIRUBINUR neg 08/03/2020 1429   KETONESUR NEGATIVE 12/11/2021 2103   PROTEINUR NEGATIVE 12/11/2021 2103   UROBILINOGEN 1.0 08/03/2020 1429   NITRITE NEGATIVE 12/11/2021 2103   LEUKOCYTESUR NEGATIVE 12/11/2021 2103    ----------------------------------------------------------------------------------------------------------------   Imaging Results:    No results found.   Assessment & Plan:  Principal Problem:   Sickle cell pain crisis (HCC)   Hb Sickle Cell Disease with crisis: Admit patient, start IVF D5 .45% Saline @ 125 mls/hour, start weight based Dilaudid PCA, start IV Toradol 15 mg Q 6 H, Restart oral home pain medications, Monitor vitals very closely, Re-evaluate pain scale regularly, 2 L of Oxygen by  [place], Patient will be re-evaluated for pain in the context of function and relationship to baseline as care progresses. Leukocytosis:  Anemia of Chronic Disease:  Chronic pain Syndrome:    DVT Prophylaxis: Subcut Lovenox   AM Labs Ordered, also please review Full Orders  Family Communication: Admission, patient's condition and plan of care including tests being ordered have been discussed with the patient who indicate understanding and agree with the plan and Code Status.  Code Status: Full Code  Consults called: None    Admission status: Inpatient    Time spent in minutes : 50 minutes  Nolon Nations  APRN, MSN, FNP-C Patient Care [facility] [address] [place], [place] [address] [phone]  03/04/2023 at 4:10 PM

## 2023-03-04 NOTE — ED Notes (Signed)
Unable to give urine at this time

## 2023-03-04 NOTE — ED Provider Notes (Signed)
[place] EMERGENCY DEPARTMENT AT [facility] Provider Note   CSN: [id number] Arrival date & time: 03/04/23  4098     History {Add pertinent medical, surgical, social history, OB history to HPI:1} Chief Complaint  Patient presents with   Sickle Cell Pain Crisis    Molly Gutierrez is a 42 y.o. female with PMH as listed below who presents with complaints of sickle cell pain crisis.  She states her crises normally happen her chest however this 1 is in her lower back and legs.  It does feel like some of the crisis she has had in the past.  She denies any fever/chills, chest pain, shortness of breath, cough, abdominal pain.  Endorses nausea vomiting and queasiness.  Denies any urinary symptoms or vaginal symptoms, diarrhea or constipation.  Denies joint swelling or pain.  Denies trauma.  Takes hydromorphone 4 mg p.o. every 4 to 6 hours at home.  Most [facility]. Last period was 02/22/23.    Past Medical History:  Diagnosis Date   Asthma    Eczema    History of pulmonary embolus (PE)    Sickle cell anemia (HCC)        Home Medications Prior to Admission medications   Medication Sig Start Date End Date Taking? Authorizing Provider  acetaminophen (TYLENOL) 500 MG tablet Take 500 mg by mouth every 6 (six) hours as needed for mild pain or headache (or cramps).    [provider]  albuterol (VENTOLIN HFA) 108 (90 Base) MCG/ACT inhaler TAKE 2 PUFFS BY MOUTH EVERY 6 HOURS AS NEEDED FOR WHEEZE OR SHORTNESS OF BREATH 01/22/23   [name], NP  budesonide-formoterol (SYMBICORT) 80-4.5 MCG/ACT inhaler INHALE 2 PUFFS INTO THE LUNGS TWICE A DAY 01/27/23   [name], NP  Cholecalciferol (VITAMIN D3 PO) Take 1 capsule by mouth at bedtime.    [provider]  Cyanocobalamin (VITAMIN B-12 PO) Take 1 tablet by mouth at bedtime.    [provider]  Deferasirox (JADENU) 360 MG TABS Take 3  tablets (1,080 mg total) by mouth daily. 02/05/23   [name], NP  diphenhydrAMINE (BENADRYL) 25 MG tablet Take 25 mg by mouth every 4 (four) hours as needed for itching (with hydromorphone dose).    [provider]  fluticasone (FLONASE) 50 MCG/ACT nasal spray Place 1 spray into both nostrils daily as needed for allergies or rhinitis. Patient not taking: Reported on 08/19/2022 12/31/21 04/02/22  [name], NP  folic acid (FOLVITE) 1 MG tablet Take 1 tablet (1 mg total) by mouth daily. 12/23/22   [name], NP  HYDROmorphone (DILAUDID) 4 MG tablet Take 1 tablet (4 mg total) by mouth every 6 (six) hours as needed (for pain). 02/22/23   [name], NP  mirtazapine (REMERON) 45 MG tablet Take 1 tablet (45 mg total) by mouth at bedtime. 12/23/22 12/23/23  [name], NP  naloxone [facility]) nasal spray 4 mg/0.1 mL Place 0.1 sprays (0.4 mg total) into the nose once as needed (opioid overdose). 12/23/22   [name], NP  OXBRYTA 500 MG TABS tablet Take 1,500 mg by mouth daily. 02/05/23   [name], NP  polyethylene glycol (MIRALAX / GLYCOLAX) 17 g packet Take 17 g by mouth daily as needed for mild constipation (MIX AS DIRECTED AND DRINK). 12/23/22   [name], NP  promethazine (PHENERGAN) 25 MG tablet Take 0.5-1 tablets (  12.5-25 mg total) by mouth every 6 (six) hours as needed for nausea or vomiting. Patient taking differently: Take 25 mg by mouth every 4 (four) hours as needed for nausea or vomiting (with hydromorphone dose). 10/31/21 12/23/22  [name], NP  Vitamin D, Ergocalciferol, (DRISDOL) 1.25 MG (50000 UNIT) CAPS capsule Take 1 capsule (50,000 Units total) by mouth once a week. 12/23/22 12/23/23  [name], NP  XARELTO 20 MG TABS tablet Take 1 tablet (20 mg total) by mouth daily with supper. Patient taking differently: Take 20 mg by mouth at bedtime. 10/29/21 12/23/22  [name], NP      Allergies    Cefaclor, Hydroxyurea, Omeprazole, and  Ketamine    Review of Systems   Review of Systems A 10 point review of systems was performed and is negative unless otherwise reported in HPI.  Physical Exam Updated Vital Signs BP 111/88 (BP Location: Right Arm)   Pulse (!) 103   Temp 98.8 F (37.1 C) (Oral)   Resp 16   Ht 5\' 3"  (1.6 m)   Wt 50.4 kg   LMP 01/20/2023 (Approximate)   SpO2 96%   BMI 19.68 kg/m  Physical Exam General: Uncomfortable appearing female, lying in bed.  HEENT: PERRLA, Sclera anicteric, MMM, trachea midline.  Cardiology: RRR, no murmurs/rubs/gallops.  Resp: Normal respiratory rate and effort. CTAB, no wheezes, rhonchi, crackles.  Abd: Soft, non-tender, non-distended. No rebound tenderness or guarding.  GU: Deferred. MSK: No peripheral edema or signs of trauma. Extremities without deformity or TTP. No lower extremity joint swelling. Skin: warm, dry.  Neuro: A&Ox4, CNs II-XII grossly intact. MAEs. Sensation grossly intact.  Psych: Normal mood and affect.   ED Results / Procedures / Treatments   Labs (all labs ordered are listed, but only abnormal results are displayed) Labs Reviewed  CBC WITH DIFFERENTIAL/PLATELET  COMPREHENSIVE METABOLIC PANEL  RETICULOCYTES  LACTATE DEHYDROGENASE  URINALYSIS, ROUTINE W REFLEX MICROSCOPIC  PREGNANCY, URINE    EKG None  Radiology No results found.  Procedures Procedures  {Document cardiac monitor, telemetry assessment procedure when appropriate:1}  Medications Ordered in ED Medications  acetaminophen (TYLENOL) tablet 1,000 mg (has no administration in time range)  ondansetron (ZOFRAN) injection 4 mg (has no administration in time range)  ketorolac (TORADOL) 15 MG/ML injection 15 mg (has no administration in time range)  HYDROmorphone (DILAUDID) injection 2 mg (has no administration in time range)    ED Course/ Medical Decision Making/ A&P                          Medical Decision Making Amount and/or Complexity of Data Reviewed Labs: ordered.  Decision-making details documented in ED Course.  Risk OTC drugs. Prescription drug management. Decision regarding hospitalization.    This patient presents to the ED for concern of ***, this involves an extensive number of treatment options, and is a complaint that carries with it a high risk of complications and morbidity.  I considered the following differential and admission for this acute, potentially life threatening condition.   MDM:    For Sickle Cell patient, consider significant/severe and potential life-threatening DDX including, but not limited to:   - Underlying occult infection (sepsis, pneumonia, meningitis),  - CNS pathology (ischemic stroke, hemorrhagic stroke, seizures, TIA) - Vaso-occlusive crisis - causing pain, bony infarction, AVN of femoral head, dactylitis - Aplastic crisis - Splenic sequestration - Respiratory/Chest pain - ACS, Acute Chest syndrome (= new CXR infiltrate + one of  38.5, cough, wheezing, tachypnea, CP), PE, pulmonary infarction - GU - priapism, papillary necrosis  - Abdominal - gallstone, acute hepatic sequestration - MSK - bony or joint infection     Clinical Course as of 03/04/23 1402  Tue Mar 04, 2023  1250 GIving 2nd round of pain medicine [HN]  1250 Retic Ct Pct(!): 11.6 Reticulocyte index 2.98, which is appropriate. [HN]  1251 Hemoglobin(!): 7.5 Approximate baseline [HN]    Clinical Course User Index [HN] Loetta Rough, MD    Labs: I Ordered, and personally interpreted labs.  The pertinent results include:  those listed above  Additional history obtained from chart review.    Cardiac Monitoring: The patient was maintained on a cardiac monitor.  I personally viewed and interpreted the cardiac monitored which showed an underlying rhythm of: NSR  Reevaluation: After the interventions noted above, I reevaluated the patient and found that they have :improved  Social Determinants of Health: Lives  independently  Disposition:  ***  Co morbidities that complicate the patient evaluation  Past Medical History:  Diagnosis Date   Asthma    Eczema    History of pulmonary embolus (PE)    Sickle cell anemia (HCC)      Medicines Meds ordered this encounter  Medications   acetaminophen (TYLENOL) tablet 1,000 mg   ondansetron (ZOFRAN) injection 4 mg   ketorolac (TORADOL) 15 MG/ML injection 15 mg   HYDROmorphone (DILAUDID) injection 2 mg    I have reviewed the patients home medicines and have made adjustments as needed  Problem List / ED Course: Problem List Items Addressed This Visit   None        {Document critical care time when appropriate:1} {Document review of labs and clinical decision tools ie heart score, Chads2Vasc2 etc:1}  {Document your independent review of radiology images, and any outside records:1} {Document your discussion with family members, caretakers, and with consultants:1} {Document social determinants of health affecting pt's care:1} {Document your decision making why or why not admission, treatments were needed:1}  This note was created using dictation software, which may contain spelling or grammatical errors.

## 2023-03-04 NOTE — ED Notes (Signed)
ED TO INPATIENT HANDOFF REPORT  Name/Age/Gender [facility] 42 y.o. female  Code Status    Code Status Orders  (From admission, onward)           Start     Ordered   03/04/23 1417  Full code  Continuous       Question:  By:  Answer:  Consent: discussion documented in EHR   03/04/23 1421           Code Status History     Date Active Date Inactive Code Status Order ID Comments User Context   12/24/2022 1138 12/24/2022 2143 Full Code [id number]  Massie Maroon, FNP Inpatient   12/09/2022 1617 12/13/2022 1639 Full Code [id number]  Massie Maroon, FNP Inpatient   12/09/2022 1049 12/09/2022 1617 Full Code [id number]  Massie Maroon, FNP Inpatient   04/02/2022 1502 04/04/2022 1900 Full Code [id number]  Massie Maroon, FNP Inpatient   02/05/2022 0505 02/09/2022 1857 Full Code [id number]  John Giovanni, MD Inpatient   12/12/2021 0449 12/17/2021 1823 Full Code [id number]  John Giovanni, MD Inpatient   11/12/2021 0121 11/15/2021 2149 Full Code [id number]  Hillary Bow, DO ED   10/04/2021 2128 10/08/2021 2133 Full Code [id number]  Marinda Elk, MD ED   06/03/2021 0246 06/08/2021 0105 Full Code [id number]  Howerter, Justin B, DO ED   06/03/2021 0153 06/03/2021 0246 Full Code [id number]  Howerter, Justin B, DO ED   04/25/2021 0635 04/28/2021 2052 Full Code [id number]  Marinda Elk, MD ED   03/17/2021 1617 03/21/2021 1935 Full Code [id number]  Massie Maroon, FNP Inpatient   02/17/2021 2224 02/20/2021 2113 Full Code [id number]  Howerter, Justin B, DO ED   02/17/2021 2217 02/17/2021 2224 Full Code [id number]  Howerter, Justin B, DO ED   01/04/2021 0747 01/08/2021 0011 Full Code [id number]  Margie Ege A, DO ED   12/08/2020 0303 12/11/2020 2115 Full Code [id number]  Hillary Bow, DO ED   11/21/2020 1905 11/26/2020 2208 Full Code [id number]  Merlene Laughter, DO ED   11/07/2020 2108 11/13/2020 2102 Full Code [id number]  Rometta Emery, MD ED   10/05/2020 2218 10/10/2020 2116 Full  Code [id number]  Rometta Emery, MD Inpatient   08/23/2020 1205 08/23/2020 2225 Full Code [id number]  Massie Maroon, FNP Inpatient   04/24/2020 2244 04/28/2020 2359 Full Code [id number]  Darlin Drop, DO ED   04/21/2020 1042 04/21/2020 2145 Full Code [id number]  Massie Maroon, FNP Inpatient   03/31/2020 1625 04/04/2020 2311 Full Code [id number]  Massie Maroon, FNP Inpatient   03/31/2020 1048 03/31/2020 1625 Full Code [id number]  Massie Maroon, FNP Inpatient   02/29/2020 1808 03/05/2020 2311 Full Code [id number]  Massie Maroon, FNP Inpatient   01/07/2020 1339 01/12/2020 2253 Full Code [id number]  Quentin Angst, MD Inpatient   07/29/2019 0312 08/04/2019 1845 Full Code [id number]  Thalia Party, DO ED   06/27/2019 0602 07/02/2019 2229 Full Code [id number]  Liborio Nixon, MD ED   05/26/2019 1646 06/01/2019 2241 Full Code [id number]  Massie Maroon, FNP Inpatient   05/05/2019 0116 05/15/2019 2110 Full Code [id number]  John Giovanni, MD Inpatient   04/19/2019 1144 04/26/2019 2031 Full Code [id number]  Massie Maroon, FNP Inpatient       Home/SNF/Other Home  Chief Complaint Sickle cell pain crisis (HCC) [D57.00]  Level of Care/Admitting Diagnosis ED Disposition     ED Disposition  Admit   Condition  --  Comment  Hospital Area: [facility] [[id number]]  Level of Care: Med-Surg [16]  May admit patient to [facility] or [facility] if equivalent level of care is available:: No  Covid Evaluation: Asymptomatic - no recent exposure (last 10 days) testing not required  Diagnosis: Sickle cell pain crisis [facility]) [[id number]]  Admitting Physician: Massie Maroon [[id number]]  Attending Physician: Quentin Angst [[id number]]  Certification:: I certify this patient will need inpatient services for at least 2 midnights  Expected Medical Readiness: 03/06/2023          Medical History Past Medical History:  Diagnosis Date   Asthma    Eczema    History of  pulmonary embolus (PE)    Sickle cell anemia (HCC)     Allergies Allergies  Allergen Reactions   Cefaclor Hives and Swelling   Hydroxyurea Palpitations and Other (See Comments)    Lowers "blood levels" and heart rate (causes HYPOtension); "it messes me up, it drops my levels and stuff"    Omeprazole Other (See Comments)    Causes "sharp pains in the stomach"   Ketamine Palpitations and Other (See Comments)    "Pt states she has had previous reaction to ketamine. States she becomes flushed, heart races, dizzy, and feels like she is going to pass out."    IV Location/Drains/Wounds Patient Lines/Drains/Airways Status     Active Line/Drains/Airways     Name Placement date Placement time Site Days   Implanted Port 01/06/20 01/06/20  0542  --  1153   Implanted Port 10/06/10 Right Chest 10/06/10  1900  Chest  4532            Labs/Imaging Results for orders placed or performed during the hospital encounter of 03/04/23 (from the past 48 hour(s))  CBC with Differential     Status: Abnormal   Collection Time: 03/04/23 11:46 AM  Result Value Ref Range   WBC 15.4 (H) 4.0 - 10.5 K/uL    Comment: WHITE COUNT CONFIRMED ON SMEAR   RBC 2.43 (L) 3.87 - 5.11 MIL/uL   Hemoglobin 7.5 (L) 12.0 - 15.0 g/dL   HCT 66.4 (L) 40.3 - 47.4 %   MCV 88.9 80.0 - 100.0 fL   MCH 30.9 26.0 - 34.0 pg   MCHC 34.7 30.0 - 36.0 g/dL   RDW 25.9 (H) 56.3 - 87.5 %   Platelets 488 (H) 150 - 400 K/uL   nRBC 0.2 0.0 - 0.2 %   Neutrophils Relative % 63 %   Neutro Abs 9.7 (H) 1.7 - 7.7 K/uL   Lymphocytes Relative 20 %   Lymphs Abs 3.0 0.7 - 4.0 K/uL   Monocytes Relative 15 %   Monocytes Absolute 2.3 (H) 0.1 - 1.0 K/uL   Eosinophils Relative 1 %   Eosinophils Absolute 0.2 0.0 - 0.5 K/uL   Basophils Relative 0 %   Basophils Absolute 0.1 0.0 - 0.1 K/uL   Immature Granulocytes 1 %   Abs Immature Granulocytes 0.07 0.00 - 0.07 K/uL   Polychromasia PRESENT    Sickle Cells PRESENT    Target Cells PRESENT      Comment: Performed at [facility], [address], [place], [place] [address]  Comprehensive metabolic panel     Status: Abnormal   Collection Time: 03/04/23 11:46 AM  Result Value Ref Range   Sodium 136 135 - 145 mmol/L   Potassium 3.3 (L) 3.5 - 5.1 mmol/L   Chloride 104 98 - 111 mmol/L  CO2 24 22 - 32 mmol/L   Glucose, Bld 95 70 - 99 mg/dL    Comment: Glucose reference range applies only to samples taken after fasting for at least 8 hours.   BUN 11 6 - 20 mg/dL   Creatinine, Ser 1.61 0.44 - 1.00 mg/dL   Calcium 8.7 (L) 8.9 - 10.3 mg/dL   Total Protein 8.3 (H) 6.5 - 8.1 g/dL   Albumin 4.5 3.5 - 5.0 g/dL   AST 35 15 - 41 U/L   ALT 14 0 - 44 U/L   Alkaline Phosphatase 50 38 - 126 U/L   Total Bilirubin 3.5 (H) 0.3 - 1.2 mg/dL   GFR, Estimated >09 >60 mL/min    Comment: (NOTE) Calculated using the CKD-EPI Creatinine Equation (2021)    Anion gap 8 5 - 15    Comment: Performed at [facility], [address] [place], [place] [address]  Reticulocytes     Status: Abnormal   Collection Time: 03/04/23 11:46 AM  Result Value Ref Range   Retic Ct Pct 11.6 (H) 0.4 - 3.1 %   RBC. 2.43 (L) 3.87 - 5.11 MIL/uL   Retic Count, Absolute 290.5 (H) 19.0 - 186.0 K/uL    Comment: REPEATED TO VERIFY CONFIRMED BY MANUAL DILUTION.    Immature Retic Fract 11.2 2.3 - 15.9 %    Comment: Performed at [facility], [address], [place], [place] [address]  Lactate dehydrogenase     Status: Abnormal   Collection Time: 03/04/23 11:46 AM  Result Value Ref Range   LDH 387 (H) 98 - 192 U/L    Comment: Performed at [facility], [address], [place], [place] [address]   No results found.  Pending Labs Unresulted Labs (From admission, onward)     Start     Ordered   03/04/23 1147  Pregnancy, urine  Once,   URGENT        03/04/23 1146   03/04/23 1146  Urinalysis, Routine w reflex microscopic -Urine, Clean Catch  Once,    URGENT       Question:  Specimen Source  Answer:  Urine, Clean Catch   03/04/23 1146   Signed and Held  Basic metabolic panel  Tomorrow morning,   R        Signed and Held   Signed and Held  CBC  Tomorrow morning,   R        Signed and Held            Vitals/Pain Today's Vitals   03/04/23 1300 03/04/23 1344 03/04/23 1456 03/04/23 1458  BP: 109/79     Pulse: 77     Resp: 18     Temp:   98.4 F (36.9 C)   TempSrc:   Oral   SpO2: 96%     Weight:      Height:      PainSc:  5   6     Isolation Precautions No active isolations  Medications Medications  diphenhydrAMINE (BENADRYL) injection 25 mg (25 mg Intravenous Not Given 03/04/23 1432)  acetaminophen (TYLENOL) tablet 1,000 mg (1,000 mg Oral Given 03/04/23 1208)  ondansetron (ZOFRAN) injection 4 mg (4 mg Intravenous Given 03/04/23 1210)  ketorolac (TORADOL) 15 MG/ML injection 15 mg (15 mg Intravenous Given 03/04/23 1208)  HYDROmorphone (DILAUDID) injection 2 mg (2 mg Intravenous Given 03/04/23 1210)  potassium chloride SA (KLOR-CON M) CR tablet 20 mEq (20 mEq Oral Given 03/04/23 1247)  HYDROmorphone (DILAUDID) injection 2 mg (2 mg Intravenous Given 03/04/23 1307)  HYDROmorphone (DILAUDID) injection 2 mg (2 mg Intravenous Given 03/04/23 1415)    Mobility walks

## 2023-03-04 NOTE — ED Triage Notes (Signed)
Pt arrived reporting scc. Pain in legs and lower back. 10/10. Reports she took her pain meds early this morning, no relief.

## 2023-03-05 DIAGNOSIS — D5700 Hb-SS disease with crisis, unspecified: Secondary | ICD-10-CM | POA: Diagnosis not present

## 2023-03-05 LAB — HEMOGLOBIN AND HEMATOCRIT, BLOOD
HCT: 22 % — ABNORMAL LOW (ref 36.0–46.0)
Hemoglobin: 7.6 g/dL — ABNORMAL LOW (ref 12.0–15.0)

## 2023-03-05 LAB — PREPARE RBC (CROSSMATCH)

## 2023-03-05 MED ORDER — SODIUM CHLORIDE 0.45 % IV SOLN: INTRAVENOUS | Status: DC

## 2023-03-05 MED ORDER — SODIUM CHLORIDE 0.9 % IV SOLN
25.0000 mg | Freq: Once | INTRAVENOUS | Status: AC
  Administered 2023-03-05: 25 mg via INTRAVENOUS
  Filled 2023-03-05: qty 25

## 2023-03-05 MED ORDER — ACETAMINOPHEN 325 MG PO TABS
650.0000 mg | ORAL_TABLET | Freq: Once | ORAL | Status: AC
  Administered 2023-03-05: 650 mg via ORAL

## 2023-03-05 MED ORDER — SODIUM CHLORIDE 0.9 % IV SOLN: 25.0000 mg | INTRAVENOUS | Status: DC | PRN

## 2023-03-05 MED ORDER — SODIUM CHLORIDE 0.9% IV SOLUTION: Freq: Once | INTRAVENOUS | Status: AC

## 2023-03-05 MED ORDER — DIPHENHYDRAMINE HCL 50 MG/ML IJ SOLN: 12.5000 mg | Freq: Once | INTRAMUSCULAR | Status: DC

## 2023-03-05 MED ORDER — CHLORHEXIDINE GLUCONATE CLOTH 2 % EX PADS
6.0000 | MEDICATED_PAD | Freq: Every day | CUTANEOUS | Status: DC
  Administered 2023-03-05 – 2023-03-09 (×5): 6 via TOPICAL

## 2023-03-05 NOTE — Progress Notes (Signed)
Subjective: Molly Gutierrez is a 42 year old female with a medical history significant for sickle cell disease, chronic pain syndrome, opiate dependence and tolerance, anemia of chronic disease, and history of PE on Xarelto, history of mild intermittent asthma was admitted for sickle cell pain crisis.  Today, patient's hemoglobin is decreased below her baseline at 6.7 g/dL.  Patient's baseline is 7-8 g/dL. Patient continues to complain of pain primarily to her low back and lower extremities rated at 7/10.  Patient denies any dizziness, shortness of breath, headache, chest pain, urinary symptoms, nausea, vomiting, or diarrhea..   Objective:  Vital signs in last 24 hours:  Vitals:   03/05/23 0848 03/05/23 0850 03/05/23 0917 03/05/23 1133  BP:  97/70 96/66 114/80  Pulse:  89 96 (!) 105  Resp:  18 18 17   Temp:  98.2 F (36.8 C) 97.8 F (36.6 C) 99.4 F (37.4 C)  TempSrc:  Oral Oral Oral  SpO2: 96% 96% 95% 100%  Weight:      Height:        Intake/Output from previous day:   Intake/Output Summary (Last 24 hours) at 03/05/2023 1151 Last data filed at 03/05/2023 0640 Gross per 24 hour  Intake 100 ml  Output --  Net 100 ml    Physical Exam: General: Alert, awake, oriented x3, in no acute distress.  HEENT: [place]/AT PEERL, EOMI Neck: Trachea midline,  no masses, no thyromegal,y no JVD, no carotid bruit OROPHARYNX:  Moist, No exudate/ erythema/lesions.  Heart: Regular rate and rhythm, without murmurs, rubs, gallops, PMI non-displaced, no heaves or thrills on palpation.  Lungs: Clear to auscultation, no wheezing or rhonchi noted. No increased vocal fremitus resonant to percussion  Abdomen: Soft, nontender, nondistended, positive bowel sounds, no masses no hepatosplenomegaly noted..  Neuro: No focal neurological deficits noted cranial nerves II through XII grossly intact. DTRs 2+ bilaterally upper and lower extremities. Strength 5 out of 5 in bilateral upper and lower  extremities. Musculoskeletal: No warm swelling or erythema around joints, no spinal tenderness noted. Psychiatric: Patient alert and oriented x3, good insight and cognition, good recent to remote recall. Lymph node survey: No cervical axillary or inguinal lymphadenopathy noted.  Lab Results:  Basic Metabolic Panel:    Component Value Date/Time   NA 133 (L) 03/05/2023 0420   NA 137 12/23/2022 1210   K 3.5 03/05/2023 0420   CL 102 03/05/2023 0420   CO2 24 03/05/2023 0420   BUN 20 03/05/2023 0420   BUN 5 (L) 12/23/2022 1210   CREATININE 1.07 (H) 03/05/2023 0420   GLUCOSE 95 03/05/2023 0420   CALCIUM 8.2 (L) 03/05/2023 0420   CBC:    Component Value Date/Time   WBC 20.3 (H) 03/05/2023 0420   HGB 6.7 (LL) 03/05/2023 0420   HGB 7.9 (L) 12/23/2022 1210   HCT 19.3 (L) 03/05/2023 0420   HCT 24.4 (L) 12/23/2022 1210   PLT 439 (H) 03/05/2023 0420   PLT 554 (H) 12/23/2022 1210   MCV 89.4 03/05/2023 0420   MCV 94 12/23/2022 1210   NEUTROABS 9.7 (H) 03/04/2023 1146   NEUTROABS 10.4 (H) 12/23/2022 1210   LYMPHSABS 3.0 03/04/2023 1146   LYMPHSABS 2.9 12/23/2022 1210   MONOABS 2.3 (H) 03/04/2023 1146   EOSABS 0.2 03/04/2023 1146   EOSABS 0.2 12/23/2022 1210   BASOSABS 0.1 03/04/2023 1146   BASOSABS 0.1 12/23/2022 1210    No results found for this or any previous visit (from the past 240 hour(s)).  Studies/Results: No results found.  Medications: Scheduled  Meds:  Chlorhexidine Gluconate Cloth  6 each Topical Daily   cholecalciferol  1,000 Units Oral Daily   diphenhydrAMINE  12.5 mg Intravenous Once   folic acid  1 mg Oral Daily   HYDROmorphone   Intravenous Q4H   mirtazapine  45 mg Oral QHS   mometasone-formoterol  2 puff Inhalation BID   rivaroxaban  20 mg Oral Q supper   senna-docusate  1 tablet Oral BID   Continuous Infusions:  diphenhydrAMINE 25 mg (03/05/23 0845)   PRN Meds:.acetaminophen, diphenhydrAMINE, diphenhydrAMINE, naloxone **AND** sodium chloride flush,  ondansetron (ZOFRAN) IV, polyethylene glycol  Consultants: none  Procedures: none  Antibiotics: none  Assessment/Plan: Principal Problem:   Sickle cell pain crisis (HCC)  Sickle cell disease with pain crisis: Continue IV Dilaudid PCA without changes Hold IV Toradol due to acute kidney injury, creatinine slightly elevated at 1.07. Restart IV fluids, 0.45% saline at 50 mL/h Monitor vital signs very closely, reevaluate pain scale regularly, and supplemental oxygen as needed.  Acute kidney injury: Creatinine slightly elevated at 1.07.  More than likely secondary to dehydration.  Restart IV fluids, gentle hydration.  Avoid all nephrotoxins.  Labs in AM.  Chronic pain syndrome: Will restart home medications as pain intensity improves  Anemia of chronic disease: Hemoglobin 6.7 g/dL, which is below patient's baseline.  Transfuse 1 unit PRBCs.  Repeat labs in AM.  Mild intermittent asthma: Stable.  Continue home medications  History of PE: Continue Xarelto Code Status: Full Code Family Communication: N/A Disposition Plan: Not yet ready for discharge  Sina Lucchesi [name]  APRN, MSN, FNP-C [facility] [address] [place], [place] [address] [phone]  If 7PM-7AM, please contact night-coverage.  03/05/2023, 11:51 AM  LOS: 1 day

## 2023-03-05 NOTE — TOC Initial Note (Signed)
Transition of Care [facility]) - Initial/Assessment Note    Patient Details  Name: Molly Gutierrez MRN: [id number] Date of Birth: Jul 15, 1980  Transition of Care [facility]) CM/SW Contact:    Beckie Busing, RN Phone [phone]  03/05/2023, 3:53 PM  Clinical Narrative:                 TOC following patient with high risk for readmission. Patient is from home where she functions independently. Patient denies DME or home health services. Patient reports PCP Frederico Hamman MD , [facility] [place]. Currently there are no TOC needs. TOC will continue to follow.   Expected Discharge Plan: Home/Self Care Barriers to Discharge: Continued Medical Work up   Patient Goals and CMS Choice Patient states their goals for this hospitalization and ongoing recovery are:: To get pain under control CMS Medicare.gov Compare Post Acute Care list provided to::  (n/a) Choice offered to / list presented to : NA [place] ownership interest in [facility].provided to::  (n/a)    Expected Discharge Plan and Services In-house Referral: NA Discharge Planning Services: CM Consult   Living arrangements for the past 2 months: Apartment                 DME Arranged: N/A DME Agency: NA       HH Arranged: NA HH Agency: NA        Prior Living Arrangements/Services Living arrangements for the past 2 months: Apartment Lives with:: Self Patient language and need for interpreter reviewed:: Yes Do you feel safe going back to the place where you live?: Yes      Need for Family Participation in Patient Care: Yes (Comment) Care giver support system in place?: Yes (comment) Current home services: Other (comment) (n/a) Criminal Activity/Legal Involvement Pertinent to Current Situation/Hospitalization: No - Comment as needed  Activities of Daily Living Home Assistive Devices/Equipment: None ADL Screening (condition at time of admission) Patient's cognitive ability adequate to  safely complete daily activities?: Yes Is the patient deaf or have difficulty hearing?: No Does the patient have difficulty seeing, even when wearing glasses/contacts?: No Does the patient have difficulty concentrating, remembering, or making decisions?: No Patient able to express need for assistance with ADLs?: Yes Does the patient have difficulty dressing or bathing?: No Independently performs ADLs?: Yes (appropriate for developmental age) Does the patient have difficulty walking or climbing stairs?: No Weakness of Legs: None Weakness of Arms/Hands: None  Permission Sought/Granted Permission sought to share information with : Family Supports Permission granted to share information with : No              Emotional Assessment Appearance:: Appears stated age Attitude/Demeanor/Rapport: Gracious Affect (typically observed): Quiet, Pleasant Orientation: : Oriented to Self, Oriented to Place, Oriented to  Time, Oriented to Situation Alcohol / Substance Use: Not Applicable Psych Involvement: No (comment)  Admission diagnosis:  Sickle cell pain crisis (HCC) [D57.00] Patient Active Problem List   Diagnosis Date Noted   Acute sickle cell crisis (HCC) 10/04/2021   Atypical chest pain 06/03/2021   Chronic pain syndrome 11/22/2020   Sickle cell disease with crisis (HCC) 10/05/2020   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   Sickle cell anemia with pain (HCC) 04/24/2020   Mild intermittent asthma without complication 03/31/2020   Sickle cell anemia with crisis (HCC) 01/07/2020   Sickle cell crisis acute chest syndrome (HCC) 07/29/2019   Drug-seeking behavior 07/07/2019   Sinus tachycardia 07/07/2019   Therapeutic opioid-induced constipation (OIC) 07/07/2019  Breast mass in female 06/29/2019   Atelectasis of right lung 06/29/2019   Sickle cell crisis (HCC) 06/27/2019   Sickle cell pain crisis (HCC) 04/19/2019   Pneumonia 03/27/2019   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   Anterior chest  wall pain 11/13/2018   Epigastric abdominal pain 11/13/2018   Hematuria 11/13/2018   Hyperbilirubinemia 11/12/2018   Long term current use of anticoagulant therapy 11/12/2018   History of pulmonary embolism 09/26/2018   Opioid dependence (HCC) 09/13/2018   Port-A-Cath in place 09/13/2018   Narcotic abuse, continuous (HCC) 08/28/2018   Anemia 07/03/2018   Personal history of other venous thrombosis and embolism 07/03/2018   History of transfusion 07/03/2018   Gastro-esophageal reflux disease without esophagitis 06/02/2018   Asthma 05/19/2018   Anxiety and depression 10/30/2017   At risk for sepsis 06/13/2017   Mitral regurgitation 02/01/2014   Itching 12/13/2013   Nausea and vomiting 12/13/2013   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   Frequent complaints of pain 11/01/2013   Hypokalemia 09/19/2013   S/P total hip arthroplasty 05/19/2013   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 [place] [address] Phone: [phone] Fax: [phone]  [facility] [address] [place], [place] [facility] [address] [place] [address] Phone: [phone] Fax: [phone]     Social Determinants of Health (SDOH) Social History: SDOH Screenings   Food Insecurity: No Food Insecurity (03/04/2023)  Housing: Patient Declined (03/04/2023)  Transportation Needs: No  Transportation Needs (03/04/2023)  Utilities: Not At Risk (03/04/2023)  Alcohol Screen: Low Risk  (03/12/2021)  Depression (PHQ2-9): Low Risk  (10/16/2022)  Financial Resource Strain: Low Risk  (07/18/2022)  Physical Activity: Inactive (07/18/2022)  Social Connections: Unknown (03/03/2023)   Received from [facility]  Stress: No Stress Concern Present (07/18/2022)  Tobacco Use: Low Risk  (03/04/2023)   SDOH Interventions:     Readmission Risk Interventions    03/05/2023    3:47 PM 04/03/2022   11:42 AM 10/07/2021    2:43 PM  Readmission Risk Prevention Plan  Transportation Screening Complete Complete Complete  Social Work Consult for Recovery Care Planning/Counseling Complete    Palliative Care Screening Not Applicable    Medication Review Oceanographer) Referral to Pharmacy Complete Complete  PCP or Specialist appointment within 3-5 days of discharge  Complete Complete  HRI or Home Care Consult  Complete   SW Recovery Care/Counseling Consult  Complete Complete  Palliative Care Screening  Not Applicable Not Applicable  Skilled Nursing Facility  Not Applicable Not Applicable

## 2023-03-05 NOTE — Plan of Care (Signed)
  Problem: Education: Goal: Knowledge of vaso-occlusive preventative measures will improve Outcome: Progressing Goal: Awareness of infection prevention will improve Outcome: Progressing Goal: Awareness of signs and symptoms of anemia will improve Outcome: Progressing Goal: Long-term complications will improve Outcome: Progressing   Problem: Self-Care: Goal: Ability to incorporate actions that prevent/reduce pain crisis will improve Outcome: Progressing   Problem: Bowel/Gastric: Goal: Gut motility will be maintained Outcome: Progressing   Problem: Tissue Perfusion: Goal: Complications related to inadequate tissue perfusion will be avoided or minimized Outcome: Progressing   Problem: Respiratory: Goal: Pulmonary complications will be avoided or minimized Outcome: Progressing Goal: Acute Chest Syndrome will be identified early to prevent complications Outcome: Progressing   Problem: Fluid Volume: Goal: Ability to maintain a balanced intake and output will improve Outcome: Progressing   Problem: Sensory: Goal: Pain level will decrease with appropriate interventions Outcome: Progressing   Problem: Health Behavior: Goal: Postive changes in compliance with treatment and prescription regimens will improve Outcome: Progressing   Problem: Education: Goal: Knowledge of General Education information will improve Description: Including pain rating scale, medication(s)/side effects and non-pharmacologic comfort measures Outcome: Progressing   Problem: Health Behavior/Discharge Planning: Goal: Ability to manage health-related needs will improve Outcome: Progressing   Problem: Clinical Measurements: Goal: Will remain free from infection Outcome: Progressing Goal: Diagnostic test results will improve Outcome: Progressing Goal: Respiratory complications will improve Outcome: Progressing Goal: Cardiovascular complication will be avoided Outcome: Progressing   Problem:  Activity: Goal: Risk for activity intolerance will decrease Outcome: Progressing   Problem: Nutrition: Goal: Adequate nutrition will be maintained Outcome: Progressing   Problem: Coping: Goal: Level of anxiety will decrease Outcome: Progressing   Problem: Elimination: Goal: Will not experience complications related to bowel motility Outcome: Progressing Goal: Will not experience complications related to urinary retention Outcome: Progressing   Problem: Pain Managment: Goal: General experience of comfort will improve Outcome: Progressing   Problem: Safety: Goal: Ability to remain free from injury will improve Outcome: Progressing   Problem: Skin Integrity: Goal: Risk for impaired skin integrity will decrease Outcome: Progressing

## 2023-03-06 DIAGNOSIS — D5700 Hb-SS disease with crisis, unspecified: Secondary | ICD-10-CM | POA: Diagnosis not present

## 2023-03-06 LAB — BASIC METABOLIC PANEL
Anion gap: 8 (ref 5–15)
BUN: 12 mg/dL (ref 6–20)
CO2: 25 mmol/L (ref 22–32)
Calcium: 8.7 mg/dL — ABNORMAL LOW (ref 8.9–10.3)
Chloride: 103 mmol/L (ref 98–111)
Creatinine, Ser: 0.49 mg/dL (ref 0.44–1.00)
GFR, Estimated: >60 mL/min (ref >60)
Glucose, Bld: 101 mg/dL — ABNORMAL HIGH (ref 70–99)
Potassium: 3.8 mmol/L (ref 3.5–5.1)
Sodium: 136 mmol/L (ref 135–145)

## 2023-03-06 LAB — CBC
HCT: 23.2 % — ABNORMAL LOW (ref 36.0–46.0)
Hemoglobin: 7.9 g/dL — ABNORMAL LOW (ref 12.0–15.0)
MCH: 30.3 pg (ref 26.0–34.0)
MCHC: 34.1 g/dL (ref 30.0–36.0)
MCV: 88.9 fL (ref 80.0–100.0)
Platelets: 428 10*3/uL — ABNORMAL HIGH (ref 150–400)
RBC: 2.61 MIL/uL — ABNORMAL LOW (ref 3.87–5.11)
RDW: 17.5 % — ABNORMAL HIGH (ref 11.5–15.5)
WBC: 17 10*3/uL — ABNORMAL HIGH (ref 4.0–10.5)
nRBC: 0.1 % (ref 0.0–0.2)

## 2023-03-06 LAB — TYPE AND SCREEN
ABO/RH(D): A POS
Antibody Screen: NEGATIVE
Unit division: 0

## 2023-03-06 LAB — BPAM RBC
Blood Product Expiration Date: 202410262359
ISSUE DATE / TIME: 202409180854
Unit Type and Rh: 5100

## 2023-03-06 MED ORDER — HYDROMORPHONE 1 MG/ML IV SOLN
INTRAVENOUS | Status: DC
  Administered 2023-03-06: 8 mg via INTRAVENOUS
  Administered 2023-03-06: 0.1 mL via INTRAVENOUS
  Administered 2023-03-07: 6 mg via INTRAVENOUS
  Administered 2023-03-07: 0.5 mg via INTRAVENOUS
  Administered 2023-03-07: 30 mg via INTRAVENOUS
  Administered 2023-03-07: 5 mg via INTRAVENOUS
  Administered 2023-03-07: 4.5 mg via INTRAVENOUS
  Administered 2023-03-07: 4 mg via INTRAVENOUS
  Administered 2023-03-07: 5.5 mg via INTRAVENOUS
  Administered 2023-03-08 (×2): 4 mg via INTRAVENOUS
  Administered 2023-03-08: 30 mg via INTRAVENOUS
  Administered 2023-03-08: 2.5 mg via INTRAVENOUS
  Administered 2023-03-09: 30 mg via INTRAVENOUS
  Administered 2023-03-09: 3 mg via INTRAVENOUS
  Administered 2023-03-09: 5.5 mg via INTRAVENOUS
  Administered 2023-03-09: 5.09 mg via INTRAVENOUS
  Administered 2023-03-09: 4 mg via INTRAVENOUS
  Filled 2023-03-06 (×3): qty 30

## 2023-03-06 MED ORDER — KETOROLAC TROMETHAMINE 30 MG/ML IJ SOLN
15.0000 mg | Freq: Four times a day (QID) | INTRAMUSCULAR | Status: AC
  Administered 2023-03-06 – 2023-03-07 (×5): 15 mg via INTRAVENOUS
  Filled 2023-03-06 (×5): qty 1

## 2023-03-06 MED ORDER — SODIUM CHLORIDE 0.9 % IV SOLN
12.5000 mg | Freq: Once | INTRAVENOUS | Status: AC
  Administered 2023-03-06: 12.5 mg via INTRAVENOUS
  Filled 2023-03-06: qty 12.5

## 2023-03-06 NOTE — Plan of Care (Signed)

## 2023-03-06 NOTE — Progress Notes (Signed)
Subjective: Molly Gutierrez is a 42 year old female with a medical history significant for sickle cell disease, chronic pain syndrome, opiate dependence and tolerance, anemia of chronic disease, and history of PE on Xarelto, history of mild intermittent asthma was admitted for sickle cell pain crisis.  Patient continues to complain of pain primarily to her low back and lower extremities rated at 7/10.  Patient denies any dizziness, shortness of breath, headache, chest pain, urinary symptoms, nausea, vomiting, or diarrhea..   Objective:  Vital signs in last 24 hours:  Vitals:   03/06/23 0415 03/06/23 0732 03/06/23 0944 03/06/23 1005  BP: 103/77   113/76  Pulse: 94   92  Resp: 13   14  Temp: 98.9 F (37.2 C)   98.4 F (36.9 C)  TempSrc: Oral   Oral  SpO2: 96% 93% 94% 97%  Weight:      Height:        Intake/Output from previous day:   Intake/Output Summary (Last 24 hours) at 03/06/2023 1059 Last data filed at 03/06/2023 0900 Gross per 24 hour  Intake 2262.9 ml  Output --  Net 2262.9 ml    Physical Exam: General: Alert, awake, oriented x3, in no acute distress.  HEENT: [place]/AT PEERL, EOMI Neck: Trachea midline,  no masses, no thyromegal,y no JVD, no carotid bruit OROPHARYNX:  Moist, No exudate/ erythema/lesions.  Heart: Regular rate and rhythm, without murmurs, rubs, gallops, PMI non-displaced, no heaves or thrills on palpation.  Lungs: Clear to auscultation, no wheezing or rhonchi noted. No increased vocal fremitus resonant to percussion  Abdomen: Soft, nontender, nondistended, positive bowel sounds, no masses no hepatosplenomegaly noted..  Neuro: No focal neurological deficits noted cranial nerves II through XII grossly intact. DTRs 2+ bilaterally upper and lower extremities. Strength 5 out of 5 in bilateral upper and lower extremities. Musculoskeletal: No warm swelling or erythema around joints, no spinal tenderness noted. Psychiatric: Patient alert and oriented x3, good  insight and cognition, good recent to remote recall. Lymph node survey: No cervical axillary or inguinal lymphadenopathy noted.  Lab Results:  Basic Metabolic Panel:    Component Value Date/Time   NA 136 03/06/2023 0530   NA 137 12/23/2022 1210   K 3.8 03/06/2023 0530   CL 103 03/06/2023 0530   CO2 25 03/06/2023 0530   BUN 12 03/06/2023 0530   BUN 5 (L) 12/23/2022 1210   CREATININE 0.49 03/06/2023 0530   GLUCOSE 101 (H) 03/06/2023 0530   CALCIUM 8.7 (L) 03/06/2023 0530   CBC:    Component Value Date/Time   WBC 17.0 (H) 03/06/2023 0530   HGB 7.9 (L) 03/06/2023 0530   HGB 7.9 (L) 12/23/2022 1210   HCT 23.2 (L) 03/06/2023 0530   HCT 24.4 (L) 12/23/2022 1210   PLT 428 (H) 03/06/2023 0530   PLT 554 (H) 12/23/2022 1210   MCV 88.9 03/06/2023 0530   MCV 94 12/23/2022 1210   NEUTROABS 9.7 (H) 03/04/2023 1146   NEUTROABS 10.4 (H) 12/23/2022 1210   LYMPHSABS 3.0 03/04/2023 1146   LYMPHSABS 2.9 12/23/2022 1210   MONOABS 2.3 (H) 03/04/2023 1146   EOSABS 0.2 03/04/2023 1146   EOSABS 0.2 12/23/2022 1210   BASOSABS 0.1 03/04/2023 1146   BASOSABS 0.1 12/23/2022 1210    No results found for this or any previous visit (from the past 240 hour(s)).  Studies/Results: No results found.  Medications: Scheduled Meds:  Chlorhexidine Gluconate Cloth  6 each Topical Daily   cholecalciferol  1,000 Units Oral Daily   folic acid  1 mg Oral Daily   HYDROmorphone   Intravenous Q4H   ketorolac  15 mg Intravenous Q6H   mirtazapine  45 mg Oral QHS   mometasone-formoterol  2 puff Inhalation BID   rivaroxaban  20 mg Oral Q supper   senna-docusate  1 tablet Oral BID   Continuous Infusions:  sodium chloride 50 mL/hr at 03/06/23 0500   diphenhydrAMINE     PRN Meds:.acetaminophen, diphenhydrAMINE, naloxone **AND** sodium chloride flush, ondansetron (ZOFRAN) IV, polyethylene glycol  Consultants: none  Procedures: none  Antibiotics: none  Assessment/Plan: Principal Problem:   Sickle  cell pain crisis (HCC) Active Problems:   Leukocytosis   History of pulmonary embolism   Hypokalemia   Mild intermittent asthma without complication   Chronic pain syndrome  Sickle cell disease with pain crisis: Continue IV Dilaudid PCA without changes Restarted Toradol 15 mg every 6 hours.  Decrease IV fluids to [facility] Monitor vital signs very closely, reevaluate pain scale regularly, and supplemental oxygen as needed.  Acute kidney injury: Resolved. Continue to follow closely, labs in am.   Chronic pain syndrome: Will restart home medications as pain intensity improves  Anemia of chronic disease: Today, hemoglobin is 7.1. Patient is s/p 1 unit PRBCs on yesterday. Continue to follow labs closely.   Mild intermittent asthma: Stable.  Continue home medications  History of PE: Continue Xarelto   Code Status: Full Code Family Communication: N/A Disposition Plan: Not yet ready for discharge  Ethin Drummond [name]  APRN, MSN, FNP-C [facility] [address] [place], [place] [address] [phone]  If 7PM-7AM, please contact night-coverage.  03/06/2023, 10:59 AM  LOS: 2 days   LOS: 2 days

## 2023-03-07 DIAGNOSIS — D5700 Hb-SS disease with crisis, unspecified: Secondary | ICD-10-CM | POA: Diagnosis not present

## 2023-03-07 NOTE — Plan of Care (Signed)

## 2023-03-07 NOTE — Plan of Care (Signed)

## 2023-03-07 NOTE — Progress Notes (Signed)
Subjective: Molly Gutierrez is a 42 year old female with a medical history significant for sickle cell disease, chronic pain syndrome, opiate dependence and tolerance, anemia of chronic disease, and history of PE on Xarelto, history of mild intermittent asthma was admitted for sickle cell pain crisis.  Patient has not new complaints on today. Patient continues to complain of pain primarily to her low back and lower extremities rated at 7/10.  Patient denies any dizziness, shortness of breath, headache, chest pain, urinary symptoms, nausea, vomiting, or diarrhea..   Objective:  Vital signs in last 24 hours:  Vitals:   03/07/23 0425 03/07/23 0647 03/07/23 1008 03/07/23 1105  BP: 106/74  102/70   Pulse: 94  89   Resp: 11 11 20 10   Temp: 98.7 F (37.1 C)  98.8 F (37.1 C)   TempSrc: Oral  Oral   SpO2: 97% 94% 97% 98%  Weight:      Height:        Intake/Output from previous day:   Intake/Output Summary (Last 24 hours) at 03/07/2023 1332 Last data filed at 03/07/2023 1105 Gross per 24 hour  Intake 1370.17 ml  Output --  Net 1370.17 ml    Physical Exam: General: Alert, awake, oriented x3, in no acute distress.  HEENT: [place]/AT PEERL, EOMI Neck: Trachea midline,  no masses, no thyromegal,y no JVD, no carotid bruit OROPHARYNX:  Moist, No exudate/ erythema/lesions.  Heart: Regular rate and rhythm, without murmurs, rubs, gallops, PMI non-displaced, no heaves or thrills on palpation.  Lungs: Clear to auscultation, no wheezing or rhonchi noted. No increased vocal fremitus resonant to percussion  Abdomen: Soft, nontender, nondistended, positive bowel sounds, no masses no hepatosplenomegaly noted..  Neuro: No focal neurological deficits noted cranial nerves II through XII grossly intact. DTRs 2+ bilaterally upper and lower extremities. Strength 5 out of 5 in bilateral upper and lower extremities. Musculoskeletal: No warm swelling or erythema around joints, no spinal tenderness  noted. Psychiatric: Patient alert and oriented x3, good insight and cognition, good recent to remote recall. Lymph node survey: No cervical axillary or inguinal lymphadenopathy noted.  Lab Results:  Basic Metabolic Panel:    Component Value Date/Time   NA 136 03/06/2023 0530   NA 137 12/23/2022 1210   K 3.8 03/06/2023 0530   CL 103 03/06/2023 0530   CO2 25 03/06/2023 0530   BUN 12 03/06/2023 0530   BUN 5 (L) 12/23/2022 1210   CREATININE 0.49 03/06/2023 0530   GLUCOSE 101 (H) 03/06/2023 0530   CALCIUM 8.7 (L) 03/06/2023 0530   CBC:    Component Value Date/Time   WBC 17.0 (H) 03/06/2023 0530   HGB 7.9 (L) 03/06/2023 0530   HGB 7.9 (L) 12/23/2022 1210   HCT 23.2 (L) 03/06/2023 0530   HCT 24.4 (L) 12/23/2022 1210   PLT 428 (H) 03/06/2023 0530   PLT 554 (H) 12/23/2022 1210   MCV 88.9 03/06/2023 0530   MCV 94 12/23/2022 1210   NEUTROABS 9.7 (H) 03/04/2023 1146   NEUTROABS 10.4 (H) 12/23/2022 1210   LYMPHSABS 3.0 03/04/2023 1146   LYMPHSABS 2.9 12/23/2022 1210   MONOABS 2.3 (H) 03/04/2023 1146   EOSABS 0.2 03/04/2023 1146   EOSABS 0.2 12/23/2022 1210   BASOSABS 0.1 03/04/2023 1146   BASOSABS 0.1 12/23/2022 1210    No results found for this or any previous visit (from the past 240 hour(s)).  Studies/Results: No results found.  Medications: Scheduled Meds:  Chlorhexidine Gluconate Cloth  6 each Topical Daily   cholecalciferol  1,000  Units Oral Daily   folic acid  1 mg Oral Daily   HYDROmorphone   Intravenous Q4H   mirtazapine  45 mg Oral QHS   mometasone-formoterol  2 puff Inhalation BID   rivaroxaban  20 mg Oral Q supper   senna-docusate  1 tablet Oral BID   Continuous Infusions:  sodium chloride 20 mL/hr at 03/07/23 0353   PRN Meds:.acetaminophen, diphenhydrAMINE, naloxone **AND** sodium chloride flush, ondansetron (ZOFRAN) IV, polyethylene glycol  Consultants: none  Procedures: none  Antibiotics: none  Assessment/Plan: Principal Problem:   Sickle  cell pain crisis (HCC) Active Problems:   Leukocytosis   History of pulmonary embolism   Hypokalemia   Mild intermittent asthma without complication   Chronic pain syndrome  Sickle cell disease with pain crisis: Continue IV Dilaudid PCA without changes Toradol 15 mg every 6 hours.  Decrease IV fluids to [facility] Monitor vital signs very closely, reevaluate pain scale regularly, and supplemental oxygen as needed.  Acute kidney injury: Resolved. Continue to follow closely, labs in am.   Chronic pain syndrome: Will restart home medications as pain intensity improves  Anemia of chronic disease: Stable. Patient is s/p 1 unit PRBCs. Continue to follow labs closely.   Mild intermittent asthma: Stable.  Continue home medications  History of PE: Continue Xarelto   Code Status: Full Code Family Communication: N/A Disposition Plan: Not yet ready for discharge. Discharge planned for 03/08/2023.   Nolon Nations  APRN, MSN, FNP-C Patient Care [facility] [address] [place], [place] [address] [phone]  If 7PM-7AM, please contact night-coverage.  03/07/2023, 1:32 PM  LOS: 3 days   LOS: 3 days

## 2023-03-07 NOTE — Plan of Care (Signed)
  Problem: Education: Goal: Knowledge of vaso-occlusive preventative measures will improve Outcome: Progressing   Problem: Bowel/Gastric: Goal: Gut motility will be maintained Outcome: Progressing

## 2023-03-07 NOTE — Care Management Important Message (Signed)
Important Message  Patient Details IM Letter given. Name: Molly Gutierrez MRN: [id number] Date of Birth: [birth year]   Medicare Important Message Given:  Yes     [name] 03/07/2023, 11:53 AM

## 2023-03-08 DIAGNOSIS — D5700 Hb-SS disease with crisis, unspecified: Secondary | ICD-10-CM | POA: Diagnosis not present

## 2023-03-08 LAB — BASIC METABOLIC PANEL
Anion gap: 7 (ref 5–15)
BUN: 12 mg/dL (ref 6–20)
CO2: 25 mmol/L (ref 22–32)
Calcium: 8.5 mg/dL — ABNORMAL LOW (ref 8.9–10.3)
Chloride: 104 mmol/L (ref 98–111)
Creatinine, Ser: 0.44 mg/dL (ref 0.44–1.00)
GFR, Estimated: >60 mL/min (ref >60)
Glucose, Bld: 95 mg/dL (ref 70–99)
Potassium: 4.1 mmol/L (ref 3.5–5.1)
Sodium: 136 mmol/L (ref 135–145)

## 2023-03-08 LAB — CBC
HCT: 22.5 % — ABNORMAL LOW (ref 36.0–46.0)
Hemoglobin: 7.5 g/dL — ABNORMAL LOW (ref 12.0–15.0)
MCH: 31.3 pg (ref 26.0–34.0)
MCHC: 33.3 g/dL (ref 30.0–36.0)
MCV: 93.8 fL (ref 80.0–100.0)
Platelets: 470 10*3/uL — ABNORMAL HIGH (ref 150–400)
RBC: 2.4 MIL/uL — ABNORMAL LOW (ref 3.87–5.11)
RDW: 18.6 % — ABNORMAL HIGH (ref 11.5–15.5)
WBC: 17.6 10*3/uL — ABNORMAL HIGH (ref 4.0–10.5)
nRBC: 0.2 % (ref 0.0–0.2)

## 2023-03-08 NOTE — Plan of Care (Signed)
Pain controled on PCA.  Ambulated in room many times.

## 2023-03-08 NOTE — Progress Notes (Addendum)
   03/08/23 2107  Assess: MEWS Score  Temp 98.8 F (37.1 C)  BP 97/73  MAP (mmHg) 82  Pulse Rate (!) 101  Resp 18  SpO2 99 %  O2 Device Room Air  Assess: MEWS Score  MEWS Temp 0  MEWS Systolic 1  MEWS Pulse 1  MEWS RR 0  MEWS LOC 0  MEWS Score 2  MEWS Score Color Yellow  Assess: if the MEWS score is Yellow or Red  Were vital signs accurate and taken at a resting state? Yes  Does the patient meet 2 or more of the SIRS criteria? No  MEWS guidelines implemented  Yes, yellow  Treat  MEWS Interventions Considered administering scheduled or prn medications/treatments as ordered  Take Vital Signs  Increase Vital Sign Frequency  Yellow: Q2hr x1, continue Q4hrs until patient remains green for 12hrs  Escalate  MEWS: Escalate Yellow: Discuss with charge nurse and consider notifying provider and/or RRT  Notify: Charge Nurse/RN  Name of Charge Nurse/RN Notified Renita, RN  Assess: SIRS CRITERIA  SIRS Temperature  0  SIRS Pulse 1  SIRS Respirations  0  SIRS WBC 0  SIRS Score Sum  1   Nothing new on assessment. On recheck pulse was below 100 and patient was at green MEWS.

## 2023-03-08 NOTE — Progress Notes (Signed)
SICKLE CELL SERVICE PROGRESS NOTE  [place] WUJ:[id number] DOB: Oct 31, 1980 DOA: 03/04/2023 PCP: [name], NP  Assessment/Plan: Principal Problem:   Sickle cell pain crisis (HCC) Active Problems:   Mild intermittent asthma without complication   History of pulmonary embolism   Leukocytosis   Hypokalemia   Chronic pain syndrome  Sickle cell pain crisis: Pain is down to 6 out of 10.  Patient is improving.  She does not think she is ready for discharge however.  She wants to stay another day.  Will continue current regimen for at least another day prior to discharge home. Anemia of chronic disease: H&H appears stable.  Continue to monitor History of pulmonary embolism: Continue Xarelto Mild intermittent asthma: Continue breathing treatment Chronic pain syndrome: Counseling.  Will resume home regimen at discharge.  Code Status: Full code Family Communication: No family at bedside Disposition Plan: Home  [facility]  Pager [phone]. If 7PM-7AM, please contact night-coverage.  03/08/2023, 7:42 PM  LOS: 4 days   Brief narrative: Molly Gutierrez is a 42 year old female with a medical history significant for sickle cell disease, chronic pain syndrome, opiate dependence and tolerance, anemia of chronic disease, and history of PE on Xarelto, history of mild intermittent asthma was admitted for sickle cell pain crisis.   Consultants: None  Procedures: None  Antibiotics: None  HPI/Subjective: Patient's pain is down to 6 out of 10.  She is being mobilized.  Pain control is adequate for the most part.  Will plan for possible discharge tomorrow.  Objective: Vitals:   03/08/23 0949 03/08/23 1253 03/08/23 1742 03/08/23 1939  BP: 111/75 104/76 100/77   Pulse: 96 94 88   Resp: 12 10 14 14   Temp: 98.4 F (36.9 C) 99 F (37.2 C) 98.9 F (37.2 C)   TempSrc: Oral Oral Oral   SpO2: 95% 98% 99% 94%  Weight:      Height:       Weight change:    Intake/Output Summary (Last 24 hours) at 03/08/2023 1942 Last data filed at 03/08/2023 0857 Gross per 24 hour  Intake 1314.67 ml  Output --  Net 1314.67 ml    General: Alert, awake, oriented x3, in no acute distress.  HEENT: [place]/AT PEERL, EOMI Neck: Trachea midline,  no masses, no thyromegal,y no JVD, no carotid bruit OROPHARYNX:  Moist, No exudate/ erythema/lesions.  Heart: Regular rate and rhythm, without murmurs, rubs, gallops, PMI non-displaced, no heaves or thrills on palpation.  Lungs: Clear to auscultation, no wheezing or rhonchi noted. No increased vocal fremitus resonant to percussion  Abdomen: Soft, nontender, nondistended, positive bowel sounds, no masses no hepatosplenomegaly noted..  Neuro: No focal neurological deficits noted cranial nerves II through XII grossly intact. DTRs 2+ bilaterally upper and lower extremities. Strength 5 out of 5 in bilateral upper and lower extremities. Musculoskeletal: No warm swelling or erythema around joints, no spinal tenderness noted. Psychiatric: Patient alert and oriented x3, good insight and cognition, good recent to remote recall. Lymph node survey: No cervical axillary or inguinal lymphadenopathy noted.   Data Reviewed: Basic Metabolic Panel: Recent Labs  Lab 03/04/23 1146 03/05/23 0420 03/06/23 0530 03/08/23 0645  NA 136 133* 136 136  K 3.3* 3.5 3.8 4.1  CL 104 102 103 104  CO2 24 24 25 25   GLUCOSE 95 95 101* 95  BUN 11 20 12 12   CREATININE 0.56 1.07* 0.49 0.44  CALCIUM 8.7* 8.2* 8.7* 8.5*   Liver Function Tests: Recent Labs  Lab 03/04/23 1146  AST  35  ALT 14  ALKPHOS 50  BILITOT 3.5*  PROT 8.3*  ALBUMIN 4.5   No results for input(s): "LIPASE", "AMYLASE" in the last 168 hours. No results for input(s): "AMMONIA" in the last 168 hours. CBC: Recent Labs  Lab 03/04/23 1146 03/05/23 0420 03/05/23 1343 03/06/23 0530 03/08/23 0645  WBC 15.4* 20.3*  --  17.0* 17.6*  NEUTROABS 9.7*  --   --   --   --   HGB 7.5*  6.7* 7.6* 7.9* 7.5*  HCT 21.6* 19.3* 22.0* 23.2* 22.5*  MCV 88.9 89.4  --  88.9 93.8  PLT 488* 439*  --  428* 470*   Cardiac Enzymes: No results for input(s): "CKTOTAL", "CKMB", "CKMBINDEX", "TROPONINI" in the last 168 hours. BNP (last 3 results) No results for input(s): "BNP" in the last 8760 hours.  ProBNP (last 3 results) No results for input(s): "PROBNP" in the last 8760 hours.  CBG: No results for input(s): "GLUCAP" in the last 168 hours.  No results found for this or any previous visit (from the past 240 hour(s)).   Studies: No results found.  Scheduled Meds:  Chlorhexidine Gluconate Cloth  6 each Topical Daily   cholecalciferol  1,000 Units Oral Daily   folic acid  1 mg Oral Daily   HYDROmorphone   Intravenous Q4H   mirtazapine  45 mg Oral QHS   mometasone-formoterol  2 puff Inhalation BID   rivaroxaban  20 mg Oral Q supper   senna-docusate  1 tablet Oral BID   Continuous Infusions:  sodium chloride 20 mL/hr at 03/08/23 1236    Principal Problem:   Sickle cell pain crisis (HCC) Active Problems:   Mild intermittent asthma without complication   History of pulmonary embolism   Leukocytosis   Hypokalemia   Chronic pain syndrome

## 2023-03-08 NOTE — Plan of Care (Signed)

## 2023-03-09 DIAGNOSIS — D5700 Hb-SS disease with crisis, unspecified: Secondary | ICD-10-CM | POA: Diagnosis not present

## 2023-03-09 LAB — CBC
HCT: 24 % — ABNORMAL LOW (ref 36.0–46.0)
Hemoglobin: 8.2 g/dL — ABNORMAL LOW (ref 12.0–15.0)
MCH: 31.4 pg (ref 26.0–34.0)
MCHC: 34.2 g/dL (ref 30.0–36.0)
MCV: 92 fL (ref 80.0–100.0)
Platelets: 504 10*3/uL — ABNORMAL HIGH (ref 150–400)
RBC: 2.61 MIL/uL — ABNORMAL LOW (ref 3.87–5.11)
RDW: 18.1 % — ABNORMAL HIGH (ref 11.5–15.5)
WBC: 20.9 10*3/uL — ABNORMAL HIGH (ref 4.0–10.5)
nRBC: 0.2 % (ref 0.0–0.2)

## 2023-03-09 MED ORDER — HEPARIN SOD (PORK) LOCK FLUSH 100 UNIT/ML IV SOLN
500.0000 [IU] | Freq: Once | INTRAVENOUS | Status: AC
  Administered 2023-03-09: 500 [IU] via INTRAVENOUS
  Filled 2023-03-09: qty 5

## 2023-03-09 MED ORDER — HYDROMORPHONE HCL 4 MG PO TABS: 4.0000 mg | ORAL_TABLET | ORAL | 0 refills | Status: DC

## 2023-03-09 NOTE — Hospital Course (Addendum)
Molly Gutierrez  is a 42 y.o. female with a medical history significant for sickle cell disease, chronic pain syndrome, opiate dependence and tolerance, history of PE on Xarelto, history of mild intermittent asthma, and history of anemia of chronic disease presents with complaints of pain to chest, low back, and lower extremities.  Patient's pain is consistent with previous sickle cell pain crisis.  Patient attributes this crisis to changes in weather.  She states that she has been attempting to manage her pain at home without success.  Patient typically takes Dilaudid 4 mg around every 6 hours as needed for pain, which was essentially ineffective.  She currently rates pain as 7/10, constant, and aching.  Patient says that her chest pain is improved since ED treatment, however pain to low back and lower extremities persists.  Patient denies fever, chills, chest pain, headache, or dizziness.  No urinary symptoms, nausea, vomiting, or diarrhea.  No sick contacts, recent travel, or known exposure to COVID-19.   Patient was admitted and initiated on Dilaudid PCA, Toradol, IV fluids.  She did very well.  Pain was adequately controlled.  Gradually titrated off PCA.  Pain was down to 3 out of 10 today.  Subsequently patient discharged home to follow-up with PCP

## 2023-03-09 NOTE — Progress Notes (Signed)
   03/09/23 0925  Vitals  Temp 99.1 F (37.3 C)  Temp Source Oral  BP 100/66  MAP (mmHg) 76  BP Method Automatic  Pulse Rate 97  Pulse Rate Source Monitor  Resp 10  MEWS COLOR  MEWS Score Color Yellow  Oxygen Therapy  SpO2 100 %  O2 Device Nasal Cannula  MEWS Score  MEWS Temp 0  MEWS Systolic 1  MEWS Pulse 0  MEWS RR 1  MEWS LOC 0  MEWS Score 2  Provider Notification  Provider Name/Title Dr. Mikeal Hawthorne  Date Provider Notified 03/09/23  Time Provider Notified 0935  Method of Notification  (chat room)  Notification Reason Other (Comment) (yellow Mews)  Provider response No new orders (pt runs a low BP)  Date of Provider Response 03/09/23  Time of Provider Response 0940  Note  Patient Observations Patient appears comfortable, in no apparent distress  Expected discharge  Expected discharge date 03/09/23

## 2023-03-09 NOTE — Plan of Care (Signed)
  Problem: Education: Goal: Knowledge of vaso-occlusive preventative measures will improve Outcome: Adequate for Discharge Goal: Awareness of infection prevention will improve Outcome: Adequate for Discharge Goal: Awareness of signs and symptoms of anemia will improve Outcome: Adequate for Discharge Goal: Long-term complications will improve Outcome: Adequate for Discharge   Problem: Self-Care: Goal: Ability to incorporate actions that prevent/reduce pain crisis will improve Outcome: Adequate for Discharge   Problem: Bowel/Gastric: Goal: Gut motility will be maintained Outcome: Adequate for Discharge   Problem: Tissue Perfusion: Goal: Complications related to inadequate tissue perfusion will be avoided or minimized Outcome: Adequate for Discharge   Problem: Respiratory: Goal: Pulmonary complications will be avoided or minimized Outcome: Adequate for Discharge   Problem: Fluid Volume: Goal: Ability to maintain a balanced intake and output will improve Outcome: Adequate for Discharge   Problem: Sensory: Goal: Pain level will decrease with appropriate interventions Outcome: Adequate for Discharge   Problem: Health Behavior: Goal: Postive changes in compliance with treatment and prescription regimens will improve Outcome: Adequate for Discharge   Problem: Education: Goal: Knowledge of General Education information will improve Description: Including pain rating scale, medication(s)/side effects and non-pharmacologic comfort measures Outcome: Adequate for Discharge   Problem: Health Behavior/Discharge Planning: Goal: Ability to manage health-related needs will improve Outcome: Adequate for Discharge   Problem: Clinical Measurements: Goal: Ability to maintain clinical measurements within normal limits will improve Outcome: Adequate for Discharge Goal: Diagnostic test results will improve Outcome: Adequate for Discharge   Problem: Activity: Goal: Risk for activity  intolerance will decrease Outcome: Adequate for Discharge   Problem: Nutrition: Goal: Adequate nutrition will be maintained Outcome: Adequate for Discharge   Problem: Coping: Goal: Level of anxiety will decrease Outcome: Adequate for Discharge

## 2023-03-09 NOTE — Discharge Summary (Signed)
Physician Discharge Summary   Patient: Molly Gutierrez MRN: [id number] DOB: March 21, 1981  Admit date:     03/04/2023  Discharge date: 03/09/2023  Discharge Physician: Lonia Blood   PCP: [name], NP   Recommendations at discharge:   Patient discharged to follow-up with PCP.  Prescriptions have been sent pharmacy  Discharge Diagnoses: Principal Problem:   Sickle cell pain crisis (HCC) Active Problems:   Mild intermittent asthma without complication   History of pulmonary embolism   Leukocytosis   Hypokalemia   Chronic pain syndrome  Resolved Problems:   * No resolved hospital problems. *  Hospital Course:  Molly Gutierrez  is a 42 y.o. female with a medical history significant for sickle cell disease, chronic pain syndrome, opiate dependence and tolerance, history of PE on Xarelto, history of mild intermittent asthma, and history of anemia of chronic disease presents with complaints of pain to chest, low back, and lower extremities.  Patient's pain is consistent with previous sickle cell pain crisis.  Patient attributes this crisis to changes in weather.  She states that she has been attempting to manage her pain at home without success.  Patient typically takes Dilaudid 4 mg around every 6 hours as needed for pain, which was essentially ineffective.  She currently rates pain as 7/10, constant, and aching.  Patient says that her chest pain is improved since ED treatment, however pain to low back and lower extremities persists.  Patient denies fever, chills, chest pain, headache, or dizziness.  No urinary symptoms, nausea, vomiting, or diarrhea.  No sick contacts, recent travel, or known exposure to COVID-19.   Patient was admitted and initiated on Dilaudid PCA, Toradol, IV fluids.  She did very well.  Pain was adequately controlled.  Gradually titrated off PCA.  Pain was down to 3 out of 10 today.  Subsequently patient discharged home to follow-up with PCP   Assessment  and Plan: No notes have been filed under this hospital service. Service: Hospitalist        Consultants: None Procedures performed: Chest x-ray Disposition: Home Diet recommendation:  Regular diet DISCHARGE MEDICATION: Allergies as of 03/09/2023       Reactions   Cefaclor Hives, Swelling, Rash   Hydroxyurea Palpitations, Other (See Comments)   Lowers "blood levels" and heart rate (causes HYPOtension); "it messes me up, it drops my levels and stuff" and Hypotension   Omeprazole Other (See Comments)   Causes "sharp pains in the stomach"   Ketamine Palpitations, Other (See Comments)   "Pt states she has had previous reaction to ketamine. States she becomes flushed, heart races, dizzy, and feels like she is going to pass out."        Medication List     TAKE these medications    acetaminophen 500 MG tablet Commonly known as: TYLENOL Take 500 mg by mouth every 6 (six) hours as needed for mild pain or headache (or cramps).   albuterol 108 (90 Base) MCG/ACT inhaler Commonly known as: VENTOLIN HFA TAKE 2 PUFFS BY MOUTH EVERY 6 HOURS AS NEEDED FOR WHEEZE OR SHORTNESS OF BREATH What changed: See the new instructions.   Deferasirox 360 MG Tabs Commonly known as: Jadenu Take 3 tablets (1,080 mg total) by mouth daily. What changed: when to take this   diphenhydrAMINE 25 MG tablet Commonly known as: BENADRYL Take 25 mg by mouth See admin instructions. Take 25 mg by mouth every four to six hours with each dose of Hydromorphone   fluticasone 50 MCG/ACT nasal spray  Commonly known as: FLONASE Place 1 spray into both nostrils daily as needed for allergies or rhinitis.   folic acid 1 MG tablet Commonly known as: FOLVITE Take 1 tablet (1 mg total) by mouth daily.   HYDROmorphone 4 MG tablet Commonly known as: DILAUDID Take 1 tablet (4 mg total) by mouth See admin instructions. Take 4 mg by mouth every four to six hours as needed for pain   mirtazapine 45 MG tablet Commonly  known as: REMERON Take 1 tablet (45 mg total) by mouth at bedtime.   naloxone 4 MG/0.1ML Liqd nasal spray kit Commonly known as: NARCAN Place 0.1 sprays (0.4 mg total) into the nose once as needed (opioid overdose).   Oxbryta 500 MG Tabs tablet Generic drug: voxelotor Take 1,500 mg by mouth daily.   polyethylene glycol 17 g packet Commonly known as: MIRALAX / GLYCOLAX Take 17 g by mouth daily as needed for mild constipation (MIX AS DIRECTED AND DRINK).   promethazine 25 MG tablet Commonly known as: PHENERGAN Take 0.5-1 tablets (12.5-25 mg total) by mouth every 6 (six) hours as needed for nausea or vomiting.   Symbicort 80-4.5 MCG/ACT inhaler Generic drug: budesonide-formoterol INHALE 2 PUFFS INTO THE LUNGS TWICE A DAY What changed: See the new instructions.   VITAMIN B-12 PO Take 1 tablet by mouth at bedtime.   Vitamin D (Ergocalciferol) 1.25 MG (50000 UNIT) Caps capsule Commonly known as: DRISDOL Take 1 capsule (50,000 Units total) by mouth once a week. What changed: when to take this   VITAMIN D3 PO Take 1 capsule by mouth at bedtime.   Xarelto 20 MG Tabs tablet Generic drug: rivaroxaban Take 1 tablet (20 mg total) by mouth daily with supper. What changed: when to take this        Discharge Exam: Filed Weights   03/04/23 1013  Weight: 50.4 kg   Constitutional: NAD, calm, comfortable Eyes: PERRL, lids and conjunctivae normal ENMT: Mucous membranes are moist. Posterior pharynx clear of any exudate or lesions.Normal dentition.  Neck: normal, supple, no masses, no thyromegaly Respiratory: clear to auscultation bilaterally, no wheezing, no crackles. Normal respiratory effort. No accessory muscle use.  Cardiovascular: Regular rate and rhythm, no murmurs / rubs / gallops. No extremity edema. 2+ pedal pulses. No carotid bruits.  Abdomen: no tenderness, no masses palpated. No hepatosplenomegaly. Bowel sounds positive.  Musculoskeletal: Good range of motion, no joint  swelling or tenderness, Skin: no rashes, lesions, ulcers. No induration Neurologic: CN 2-12 grossly intact. Sensation intact, DTR normal. Strength 5/5 in all 4.  Psychiatric: Normal judgment and insight. Alert and oriented x 3. Normal mood   Condition at discharge: good  The results of significant diagnostics from this hospitalization (including imaging, microbiology, ancillary and laboratory) are listed below for reference.   Imaging Studies: No results found.  Microbiology: Results for orders placed or performed during the hospital encounter of 02/04/22  Resp Panel by RT-PCR (Flu A&B, Covid) Anterior Nasal Swab     Status: None   Collection Time: 02/04/22  9:57 PM   Specimen: Anterior Nasal Swab  Result Value Ref Range Status   SARS Coronavirus 2 by RT PCR NEGATIVE NEGATIVE Final    Comment: (NOTE) SARS-CoV-2 target nucleic acids are NOT DETECTED.  The SARS-CoV-2 RNA is generally detectable in upper respiratory specimens during the acute phase of infection. The lowest concentration of SARS-CoV-2 viral copies this assay can detect is 138 copies/mL. A negative result does not preclude SARS-Cov-2 infection and should not be used as  the sole basis for treatment or other patient management decisions. A negative result may occur with  improper specimen collection/handling, submission of specimen other than nasopharyngeal swab, presence of viral mutation(s) within the areas targeted by this assay, and inadequate number of viral copies(<138 copies/mL). A negative result must be combined with clinical observations, patient history, and epidemiological information. The expected result is Negative.  Fact Sheet for Patients:  BloggerCourse.com  Fact Sheet for Healthcare Providers:  SeriousBroker.it  This test is no t yet approved or cleared by the [place] FDA and  has been authorized for detection and/or diagnosis of SARS-CoV-2  by FDA under an Emergency Use Authorization (EUA). This EUA will remain  in effect (meaning this test can be used) for the duration of the COVID-19 declaration under Section 564(b)(1) of the Act, 21 U.S.C.section 360bbb-3(b)(1), unless the authorization is terminated  or revoked sooner.       Influenza A by PCR NEGATIVE NEGATIVE Final   Influenza B by PCR NEGATIVE NEGATIVE Final    Comment: (NOTE) The Xpert Xpress SARS-CoV-2/FLU/RSV plus assay is intended as an aid in the diagnosis of influenza from Nasopharyngeal swab specimens and should not be used as a sole basis for treatment. Nasal washings and aspirates are unacceptable for Xpert Xpress SARS-CoV-2/FLU/RSV testing.  Fact Sheet for Patients: BloggerCourse.com  Fact Sheet for Healthcare Providers: SeriousBroker.it  This test is not yet approved or cleared by the [place] FDA and has been authorized for detection and/or diagnosis of SARS-CoV-2 by FDA under an Emergency Use Authorization (EUA). This EUA will remain in effect (meaning this test can be used) for the duration of the COVID-19 declaration under Section 564(b)(1) of the Act, 21 U.S.C. section 360bbb-3(b)(1), unless the authorization is terminated or revoked.  Performed at [facility], [address], [place], [place] [address]     Labs: CBC: Recent Labs  Lab 03/04/23 1146 03/05/23 0420 03/05/23 1343 03/06/23 0530 03/08/23 0645 03/09/23 1049  WBC 15.4* 20.3*  --  17.0* 17.6* 20.9*  NEUTROABS 9.7*  --   --   --   --   --   HGB 7.5* 6.7* 7.6* 7.9* 7.5* 8.2*  HCT 21.6* 19.3* 22.0* 23.2* 22.5* 24.0*  MCV 88.9 89.4  --  88.9 93.8 92.0  PLT 488* 439*  --  428* 470* 504*   Basic Metabolic Panel: Recent Labs  Lab 03/04/23 1146 03/05/23 0420 03/06/23 0530 03/08/23 0645  NA 136 133* 136 136  K 3.3* 3.5 3.8 4.1  CL 104 102 103 104  CO2 24 24 25 25   GLUCOSE 95 95 101* 95  BUN 11  20 12 12   CREATININE 0.56 1.07* 0.49 0.44  CALCIUM 8.7* 8.2* 8.7* 8.5*   Liver Function Tests: Recent Labs  Lab 03/04/23 1146  AST 35  ALT 14  ALKPHOS 50  BILITOT 3.5*  PROT 8.3*  ALBUMIN 4.5   CBG: No results for input(s): "GLUCAP" in the last 168 hours.  Discharge time spent: greater than patient was admitted and treated with IV Dilaudid, Toradol, IV fluids.  She did very well.  Pain control was adequate.  30 minutes.  SignedLonia Blood, MD [facility] 03/09/2023

## 2023-03-09 NOTE — Progress Notes (Signed)
Discharge instructions given to patient with next medications due documented on discharge papers. One prescription is needed to be picked up at pharmacy. Molly Gutierrez refuses a wheelchair ride to the front door. She ambulated to the front door.

## 2023-03-14 ENCOUNTER — Telehealth: Payer: Self-pay | Admitting: Nurse Practitioner

## 2023-03-14 NOTE — Telephone Encounter (Signed)
Spoke to pt about [employer]ia Phlegm being pullled from the market. Patient states she has not taken the medication in about 2 months. She has had issues with prior authorizations, so she has not had the medication filled and not taking it currently.

## 2023-03-16 DIAGNOSIS — R079 Chest pain, unspecified: Secondary | ICD-10-CM | POA: Diagnosis not present

## 2023-03-16 DIAGNOSIS — F32A Depression, unspecified: Secondary | ICD-10-CM | POA: Diagnosis not present

## 2023-03-16 DIAGNOSIS — D57219 Sickle-cell/Hb-C disease with crisis, unspecified: Secondary | ICD-10-CM | POA: Diagnosis not present

## 2023-03-16 DIAGNOSIS — J45909 Unspecified asthma, uncomplicated: Secondary | ICD-10-CM | POA: Diagnosis not present

## 2023-03-16 DIAGNOSIS — Z7901 Long term (current) use of anticoagulants: Secondary | ICD-10-CM | POA: Diagnosis not present

## 2023-03-16 DIAGNOSIS — Z7951 Long term (current) use of inhaled steroids: Secondary | ICD-10-CM | POA: Diagnosis not present

## 2023-03-16 DIAGNOSIS — R0789 Other chest pain: Secondary | ICD-10-CM | POA: Diagnosis not present

## 2023-03-16 DIAGNOSIS — Z86711 Personal history of pulmonary embolism: Secondary | ICD-10-CM | POA: Diagnosis not present

## 2023-03-16 DIAGNOSIS — I272 Pulmonary hypertension, unspecified: Secondary | ICD-10-CM | POA: Diagnosis not present

## 2023-03-16 DIAGNOSIS — D5700 Hb-SS disease with crisis, unspecified: Secondary | ICD-10-CM | POA: Diagnosis not present

## 2023-03-16 DIAGNOSIS — Z881 Allergy status to other antibiotic agents status: Secondary | ICD-10-CM | POA: Diagnosis not present

## 2023-03-16 DIAGNOSIS — K219 Gastro-esophageal reflux disease without esophagitis: Secondary | ICD-10-CM | POA: Diagnosis not present

## 2023-03-17 DIAGNOSIS — R079 Chest pain, unspecified: Secondary | ICD-10-CM | POA: Diagnosis not present

## 2023-03-17 DIAGNOSIS — R0789 Other chest pain: Secondary | ICD-10-CM | POA: Diagnosis not present

## 2023-03-17 DIAGNOSIS — D5700 Hb-SS disease with crisis, unspecified: Secondary | ICD-10-CM | POA: Diagnosis not present

## 2023-03-17 DIAGNOSIS — D57219 Sickle-cell/Hb-C disease with crisis, unspecified: Secondary | ICD-10-CM | POA: Diagnosis not present

## 2023-03-18 DIAGNOSIS — D5700 Hb-SS disease with crisis, unspecified: Secondary | ICD-10-CM | POA: Diagnosis not present

## 2023-03-19 DIAGNOSIS — D5700 Hb-SS disease with crisis, unspecified: Secondary | ICD-10-CM | POA: Diagnosis not present

## 2023-03-19 DIAGNOSIS — I272 Pulmonary hypertension, unspecified: Secondary | ICD-10-CM | POA: Diagnosis not present

## 2023-03-19 DIAGNOSIS — K219 Gastro-esophageal reflux disease without esophagitis: Secondary | ICD-10-CM | POA: Diagnosis not present

## 2023-03-19 DIAGNOSIS — Z86711 Personal history of pulmonary embolism: Secondary | ICD-10-CM | POA: Diagnosis not present

## 2023-03-20 DIAGNOSIS — I272 Pulmonary hypertension, unspecified: Secondary | ICD-10-CM | POA: Diagnosis not present

## 2023-03-20 DIAGNOSIS — K219 Gastro-esophageal reflux disease without esophagitis: Secondary | ICD-10-CM | POA: Diagnosis not present

## 2023-03-20 DIAGNOSIS — Z86711 Personal history of pulmonary embolism: Secondary | ICD-10-CM | POA: Diagnosis not present

## 2023-03-20 DIAGNOSIS — D5700 Hb-SS disease with crisis, unspecified: Secondary | ICD-10-CM | POA: Diagnosis not present

## 2023-03-21 DIAGNOSIS — Z86711 Personal history of pulmonary embolism: Secondary | ICD-10-CM | POA: Diagnosis not present

## 2023-03-21 DIAGNOSIS — K219 Gastro-esophageal reflux disease without esophagitis: Secondary | ICD-10-CM | POA: Diagnosis not present

## 2023-03-21 DIAGNOSIS — I272 Pulmonary hypertension, unspecified: Secondary | ICD-10-CM | POA: Diagnosis not present

## 2023-03-21 DIAGNOSIS — D5700 Hb-SS disease with crisis, unspecified: Secondary | ICD-10-CM | POA: Diagnosis not present

## 2023-03-22 DIAGNOSIS — Z86711 Personal history of pulmonary embolism: Secondary | ICD-10-CM | POA: Diagnosis not present

## 2023-03-22 DIAGNOSIS — D5700 Hb-SS disease with crisis, unspecified: Secondary | ICD-10-CM | POA: Diagnosis not present

## 2023-03-22 DIAGNOSIS — I272 Pulmonary hypertension, unspecified: Secondary | ICD-10-CM | POA: Diagnosis not present

## 2023-03-22 DIAGNOSIS — K219 Gastro-esophageal reflux disease without esophagitis: Secondary | ICD-10-CM | POA: Diagnosis not present

## 2023-03-23 DIAGNOSIS — I272 Pulmonary hypertension, unspecified: Secondary | ICD-10-CM | POA: Diagnosis not present

## 2023-03-23 DIAGNOSIS — Z86711 Personal history of pulmonary embolism: Secondary | ICD-10-CM | POA: Diagnosis not present

## 2023-03-23 DIAGNOSIS — K219 Gastro-esophageal reflux disease without esophagitis: Secondary | ICD-10-CM | POA: Diagnosis not present

## 2023-03-23 DIAGNOSIS — D5700 Hb-SS disease with crisis, unspecified: Secondary | ICD-10-CM | POA: Diagnosis not present

## 2023-03-24 DIAGNOSIS — K219 Gastro-esophageal reflux disease without esophagitis: Secondary | ICD-10-CM | POA: Diagnosis not present

## 2023-03-24 DIAGNOSIS — I272 Pulmonary hypertension, unspecified: Secondary | ICD-10-CM | POA: Diagnosis not present

## 2023-03-24 DIAGNOSIS — D5700 Hb-SS disease with crisis, unspecified: Secondary | ICD-10-CM | POA: Diagnosis not present

## 2023-03-24 DIAGNOSIS — Z86711 Personal history of pulmonary embolism: Secondary | ICD-10-CM | POA: Diagnosis not present

## 2023-03-25 DIAGNOSIS — Z86711 Personal history of pulmonary embolism: Secondary | ICD-10-CM | POA: Diagnosis not present

## 2023-03-25 DIAGNOSIS — J45909 Unspecified asthma, uncomplicated: Secondary | ICD-10-CM | POA: Diagnosis not present

## 2023-03-25 DIAGNOSIS — I272 Pulmonary hypertension, unspecified: Secondary | ICD-10-CM | POA: Diagnosis not present

## 2023-03-25 DIAGNOSIS — D5700 Hb-SS disease with crisis, unspecified: Secondary | ICD-10-CM | POA: Diagnosis not present

## 2023-03-26 DIAGNOSIS — D5700 Hb-SS disease with crisis, unspecified: Secondary | ICD-10-CM | POA: Diagnosis not present

## 2023-03-26 DIAGNOSIS — Z86711 Personal history of pulmonary embolism: Secondary | ICD-10-CM | POA: Diagnosis not present

## 2023-03-26 DIAGNOSIS — I272 Pulmonary hypertension, unspecified: Secondary | ICD-10-CM | POA: Diagnosis not present

## 2023-03-26 DIAGNOSIS — J45909 Unspecified asthma, uncomplicated: Secondary | ICD-10-CM | POA: Diagnosis not present

## 2023-03-26 NOTE — Telephone Encounter (Addendum)
Caller & Relationship to patient:  MRN #  [id number]   Call Back Number:   Date of Last Office Visit: 01/27/2023     Date of Next Office Visit: 03/14/2023    Medication(s) to be Refilled: Hydromorphone  Preferred Pharmacy:   ** Please notify patient to allow 48-72 hours to process** **Let patient know to contact pharmacy at the end of the day to make sure medication is ready. ** **If patient has not been seen in a year or longer, book an appointment **Advise to use MyChart for refill requests OR to contact their pharmacy

## 2023-03-27 ENCOUNTER — Other Ambulatory Visit: Payer: Self-pay

## 2023-03-27 ENCOUNTER — Other Ambulatory Visit (HOSPITAL_COMMUNITY): Payer: Self-pay

## 2023-03-27 ENCOUNTER — Other Ambulatory Visit: Payer: Self-pay | Admitting: Nurse Practitioner

## 2023-03-27 DIAGNOSIS — D5700 Hb-SS disease with crisis, unspecified: Secondary | ICD-10-CM

## 2023-03-27 MED ORDER — HYDROMORPHONE HCL 4 MG PO TABS
4.0000 mg | ORAL_TABLET | ORAL | 0 refills | Status: DC
  Filled 2023-03-27 – 2023-03-29 (×4): qty 60, 10d supply, fill #0

## 2023-03-28 ENCOUNTER — Other Ambulatory Visit: Payer: Self-pay | Admitting: Nurse Practitioner

## 2023-03-28 ENCOUNTER — Other Ambulatory Visit (HOSPITAL_COMMUNITY): Payer: Self-pay

## 2023-03-28 ENCOUNTER — Other Ambulatory Visit: Payer: Self-pay

## 2023-03-28 NOTE — Telephone Encounter (Signed)
Caller & Relationship to patient:  MRN #  [id number]   Call Back Number:   Date of Last Office Visit: 01/27/2023     Date of Next Office Visit: 03/14/2023    Medication(s) to be Refilled: Hydromorphone  Preferred Pharmacy:   ** Please notify patient to allow 48-72 hours to process** **Let patient know to contact pharmacy at the end of the day to make sure medication is ready. ** **If patient has not been seen in a year or longer, book an appointment **Advise to use MyChart for refill requests OR to contact their pharmacy

## 2023-03-29 ENCOUNTER — Other Ambulatory Visit (HOSPITAL_BASED_OUTPATIENT_CLINIC_OR_DEPARTMENT_OTHER): Payer: Self-pay

## 2023-03-29 ENCOUNTER — Other Ambulatory Visit (HOSPITAL_COMMUNITY): Payer: Self-pay

## 2023-03-31 ENCOUNTER — Ambulatory Visit: Payer: Self-pay | Admitting: Nurse Practitioner

## 2023-04-07 ENCOUNTER — Other Ambulatory Visit: Payer: Self-pay

## 2023-04-07 ENCOUNTER — Emergency Department (HOSPITAL_COMMUNITY)
Admission: EM | Admit: 2023-04-07 | Discharge: 2023-04-07 | Disposition: A | Discharge status: Home / Self Care | Payer: Medicare HMO | Attending: Emergency Medicine | Admitting: Emergency Medicine

## 2023-04-07 ENCOUNTER — Encounter (HOSPITAL_COMMUNITY): Payer: Self-pay

## 2023-04-07 DIAGNOSIS — D5700 Hb-SS disease with crisis, unspecified: Secondary | ICD-10-CM

## 2023-04-07 DIAGNOSIS — Z7901 Long term (current) use of anticoagulants: Secondary | ICD-10-CM | POA: Insufficient documentation

## 2023-04-07 DIAGNOSIS — M549 Dorsalgia, unspecified: Secondary | ICD-10-CM | POA: Diagnosis not present

## 2023-04-07 DIAGNOSIS — M79661 Pain in right lower leg: Secondary | ICD-10-CM | POA: Diagnosis not present

## 2023-04-07 DIAGNOSIS — M79662 Pain in left lower leg: Secondary | ICD-10-CM | POA: Diagnosis not present

## 2023-04-07 LAB — COMPREHENSIVE METABOLIC PANEL
ALT: 13 U/L (ref 0–44)
AST: 38 U/L (ref 15–41)
Albumin: 4.5 g/dL (ref 3.5–5.0)
Alkaline Phosphatase: 49 U/L (ref 38–126)
Anion gap: 10 (ref 5–15)
BUN: 12 mg/dL (ref 6–20)
CO2: 22 mmol/L (ref 22–32)
Calcium: 9.4 mg/dL (ref 8.9–10.3)
Chloride: 105 mmol/L (ref 98–111)
Creatinine, Ser: 0.55 mg/dL (ref 0.44–1.00)
GFR, Estimated: >60 mL/min (ref >60)
Glucose, Bld: 99 mg/dL (ref 70–99)
Potassium: 4 mmol/L (ref 3.5–5.1)
Sodium: 137 mmol/L (ref 135–145)
Total Bilirubin: 2.9 mg/dL — ABNORMAL HIGH (ref 0.3–1.2)
Total Protein: 8.5 g/dL — ABNORMAL HIGH (ref 6.5–8.1)

## 2023-04-07 LAB — CBC WITH DIFFERENTIAL/PLATELET
Abs Immature Granulocytes: 0.11 10*3/uL — ABNORMAL HIGH (ref 0.00–0.07)
Basophils Absolute: 0.1 10*3/uL (ref 0.0–0.1)
Basophils Relative: 0 %
Eosinophils Absolute: 0.1 10*3/uL (ref 0.0–0.5)
Eosinophils Relative: 1 %
HCT: 24.3 % — ABNORMAL LOW (ref 36.0–46.0)
Hemoglobin: 8.5 g/dL — ABNORMAL LOW (ref 12.0–15.0)
Immature Granulocytes: 1 %
Lymphocytes Relative: 13 %
Lymphs Abs: 2.2 10*3/uL (ref 0.7–4.0)
MCH: 31.4 pg (ref 26.0–34.0)
MCHC: 35 g/dL (ref 30.0–36.0)
MCV: 89.7 fL (ref 80.0–100.0)
Monocytes Absolute: 1.9 10*3/uL — ABNORMAL HIGH (ref 0.1–1.0)
Monocytes Relative: 11 %
Neutro Abs: 12.4 10*3/uL — ABNORMAL HIGH (ref 1.7–7.7)
Neutrophils Relative %: 74 %
Platelets: 505 10*3/uL — ABNORMAL HIGH (ref 150–400)
RBC: 2.71 MIL/uL — ABNORMAL LOW (ref 3.87–5.11)
RDW: 17.1 % — ABNORMAL HIGH (ref 11.5–15.5)
WBC: 16.8 10*3/uL — ABNORMAL HIGH (ref 4.0–10.5)
nRBC: 0.2 % (ref 0.0–0.2)

## 2023-04-07 LAB — RETICULOCYTES
Immature Retic Fract: 10 % (ref 2.3–15.9)
RBC.: 2.7 MIL/uL — ABNORMAL LOW (ref 3.87–5.11)
Retic Count, Absolute: 290.5 10*3/uL — ABNORMAL HIGH (ref 19.0–186.0)
Retic Ct Pct: 10.6 % — ABNORMAL HIGH (ref 0.4–3.1)

## 2023-04-07 MED ORDER — HYDROMORPHONE HCL 2 MG/ML IJ SOLN
2.0000 mg | INTRAMUSCULAR | Status: AC
  Administered 2023-04-07: 2 mg via INTRAVENOUS
  Filled 2023-04-07: qty 1

## 2023-04-07 MED ORDER — HYDROMORPHONE HCL 2 MG/ML IJ SOLN
2.0000 mg | INTRAMUSCULAR | Status: AC
  Administered 2023-04-07: 2 mg via INTRAVENOUS

## 2023-04-07 MED ORDER — DIPHENHYDRAMINE HCL 25 MG PO CAPS: 25.0000 mg | ORAL_CAPSULE | ORAL | Status: DC | PRN

## 2023-04-07 MED ORDER — HEPARIN SOD (PORK) LOCK FLUSH 100 UNIT/ML IV SOLN
500.0000 [IU] | Freq: Once | INTRAVENOUS | Status: AC
  Administered 2023-04-07: 500 [IU]
  Filled 2023-04-07: qty 5

## 2023-04-07 MED ORDER — HYDROMORPHONE HCL 2 MG/ML IJ SOLN
2.0000 mg | INTRAMUSCULAR | Status: AC
  Filled 2023-04-07: qty 1

## 2023-04-07 MED ORDER — ONDANSETRON 8 MG PO TBDP
8.0000 mg | ORAL_TABLET | Freq: Three times a day (TID) | ORAL | Status: DC | PRN
  Administered 2023-04-07: 8 mg via ORAL
  Filled 2023-04-07: qty 1

## 2023-04-07 NOTE — ED Triage Notes (Signed)
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 Lower back and BLE leg pain. Atraumatic Rates pain 9/10. Endorses taking her normal pain meds already, no relief. Denies any other symptoms

## 2023-04-07 NOTE — ED Provider Notes (Signed)
[place] EMERGENCY DEPARTMENT AT [facility] Provider Note   CSN: [id number] Arrival date & time: 04/07/23  4098     History  Chief Complaint  Patient presents with   Sickle Cell Pain Crisis   Back Pain   Leg Pain    Buna Shawler is a 42 y.o. female.  42 year old with history of sickle cell disease presents with her usual painful vaso-occlusive crisis.  States that she has had pain for several days.  Denies any fever or chills.  No cough or congestion.  Pain is in her back and her limbs.  Denies any urinary symptoms.  No abdominal discomfort.  Pain unresponsive to her home medications.       Home Medications Prior to Admission medications   Medication Sig Start Date End Date Taking? Authorizing Provider  acetaminophen (TYLENOL) 500 MG tablet Take 500 mg by mouth every 6 (six) hours as needed for mild pain or headache (or cramps).    [provider]  albuterol (VENTOLIN HFA) 108 (90 Base) MCG/ACT inhaler TAKE 2 PUFFS BY MOUTH EVERY 6 HOURS AS NEEDED FOR WHEEZE OR SHORTNESS OF BREATH Patient taking differently: Inhale 2 puffs into the lungs every 6 (six) hours as needed for wheezing or shortness of breath. 01/22/23   [name], NP  budesonide-formoterol (SYMBICORT) 80-4.5 MCG/ACT inhaler INHALE 2 PUFFS INTO THE LUNGS TWICE A DAY Patient taking differently: Inhale 2 puffs into the lungs 2 (two) times daily. 01/27/23   [name], NP  Cholecalciferol (VITAMIN D3 PO) Take 1 capsule by mouth at bedtime.    [provider]  Cyanocobalamin (VITAMIN B-12 PO) Take 1 tablet by mouth at bedtime.    [provider]  Deferasirox (JADENU) 360 MG TABS Take 3 tablets (1,080 mg total) by mouth daily. Patient taking differently: Take 1,080 mg by mouth at bedtime. 02/05/23   [name], NP  diphenhydrAMINE (BENADRYL) 25 MG tablet Take 25 mg by mouth See admin instructions. Take 25 mg by mouth every four to six hours with each dose of  Hydromorphone    [provider]  fluticasone (FLONASE) 50 MCG/ACT nasal spray Place 1 spray into both nostrils daily as needed for allergies or rhinitis. 12/31/21 03/04/23  [name], NP  folic acid (FOLVITE) 1 MG tablet Take 1 tablet (1 mg total) by mouth daily. 12/23/22   [name], NP  HYDROmorphone (DILAUDID) 4 MG tablet Take 1 tablet (4 mg total) by mouth every 4 to 6 hours as needed for pain 03/27/23   [name], NP  mirtazapine (REMERON) 45 MG tablet Take 1 tablet (45 mg total) by mouth at bedtime. 12/23/22 12/23/23  [name], NP  naloxone [facility]) nasal spray 4 mg/0.1 mL Place 0.1 sprays (0.4 mg total) into the nose once as needed (opioid overdose). 12/23/22   [name], NP  OXBRYTA 500 MG TABS tablet Take 1,500 mg by mouth daily. Patient not taking: Reported on 03/04/2023 02/05/23   [name], NP  polyethylene glycol (MIRALAX / GLYCOLAX) 17 g packet Take 17 g by mouth daily as needed for mild constipation (MIX AS DIRECTED AND DRINK). 12/23/22   [name], NP  promethazine (PHENERGAN) 25 MG tablet Take 0.5-1 tablets (12.5-25 mg total) by mouth every 6 (six) hours as needed for nausea or vomiting. 10/31/21 03/04/23  [name], NP  Vitamin D, Ergocalciferol, (DRISDOL) 1.25 MG (50000 UNIT) CAPS capsule Take 1 capsule (50,000 Units total)  by mouth once a week. Patient taking differently: Take 50,000 Units by mouth every Wednesday. 12/23/22 12/23/23  [name], NP  XARELTO 20 MG TABS tablet Take 1 tablet (20 mg total) by mouth daily with supper. Patient taking differently: Take 20 mg by mouth at bedtime. 10/29/21 03/04/23  [name], NP      Allergies    Cefaclor, Hydroxyurea, Omeprazole, and Ketamine    Review of Systems   Review of Systems  All other systems reviewed and are negative.   Physical Exam Updated Vital Signs BP (!) 118/97   Pulse 94   Temp 99.1 F (37.3 C) (Oral)   Resp 18   LMP 02/22/2023 (Exact Date)   SpO2  98%  Physical Exam Vitals and nursing note reviewed.  Constitutional:      General: She is not in acute distress.    Appearance: Normal appearance. She is well-developed. She is not toxic-appearing.  HENT:     Head: Normocephalic and atraumatic.  Eyes:     General: Lids are normal.     Conjunctiva/sclera: Conjunctivae normal.     Pupils: Pupils are equal, round, and reactive to light.  Neck:     Thyroid: No thyroid mass.     Trachea: No tracheal deviation.  Cardiovascular:     Rate and Rhythm: Normal rate and regular rhythm.     Heart sounds: Normal heart sounds. No murmur heard.    No gallop.  Pulmonary:     Effort: Pulmonary effort is normal. No respiratory distress.     Breath sounds: Normal breath sounds. No stridor. No decreased breath sounds, wheezing, rhonchi or rales.  Abdominal:     General: There is no distension.     Palpations: Abdomen is soft.     Tenderness: There is no abdominal tenderness. There is no rebound.  Musculoskeletal:        General: No tenderness. Normal range of motion.     Cervical back: Normal range of motion and neck supple.  Skin:    General: Skin is warm and dry.     Findings: No abrasion or rash.  Neurological:     Mental Status: She is alert and oriented to person, place, and time. Mental status is at baseline.     GCS: GCS eye subscore is 4. GCS verbal subscore is 5. GCS motor subscore is 6.     Cranial Nerves: Cranial nerves are intact. No cranial nerve deficit.     Sensory: No sensory deficit.     Motor: Motor function is intact.  Psychiatric:        Attention and Perception: Attention normal.        Speech: Speech normal.        Behavior: Behavior normal.     ED Results / Procedures / Treatments   Labs (all labs ordered are listed, but only abnormal results are displayed) Labs Reviewed  CBC WITH DIFFERENTIAL/PLATELET  RETICULOCYTES  COMPREHENSIVE METABOLIC PANEL    EKG None  Radiology No results  found.  Procedures Procedures    Medications Ordered in ED Medications  diphenhydrAMINE (BENADRYL) capsule 25-50 mg (has no administration in time range)  ondansetron (ZOFRAN-ODT) disintegrating tablet 8 mg (has no administration in time range)  HYDROmorphone (DILAUDID) injection 2 mg (has no administration in time range)  HYDROmorphone (DILAUDID) injection 2 mg (has no administration in time range)  HYDROmorphone (DILAUDID) injection 2 mg (has no administration in time range)    ED Course/ Medical Decision Making/  A&P                                 Medical Decision Making Amount and/or Complexity of Data Reviewed Labs: ordered.  Risk Prescription drug management.   Patient given 3 doses of IV hydromorphone here.  States her pain is controlled at this time.  She was offered admission and has deferred.  Her hemoglobin is stable at this time at 8.5.  Reticulocyte count response is adequate.  Patient will be discharged to follow-up with her doctor as needed        Final Clinical Impression(s) / ED Diagnoses Final diagnoses:  None    Rx / DC Orders ED Discharge Orders     None         Lorre Nick, MD 04/07/23 1526

## 2023-04-08 DIAGNOSIS — Z7951 Long term (current) use of inhaled steroids: Secondary | ICD-10-CM | POA: Diagnosis not present

## 2023-04-08 DIAGNOSIS — F32A Depression, unspecified: Secondary | ICD-10-CM | POA: Diagnosis not present

## 2023-04-08 DIAGNOSIS — D72829 Elevated white blood cell count, unspecified: Secondary | ICD-10-CM | POA: Diagnosis not present

## 2023-04-08 DIAGNOSIS — Z86711 Personal history of pulmonary embolism: Secondary | ICD-10-CM | POA: Diagnosis not present

## 2023-04-08 DIAGNOSIS — D5700 Hb-SS disease with crisis, unspecified: Secondary | ICD-10-CM | POA: Diagnosis not present

## 2023-04-08 DIAGNOSIS — Z79899 Other long term (current) drug therapy: Secondary | ICD-10-CM | POA: Diagnosis not present

## 2023-04-08 DIAGNOSIS — J45909 Unspecified asthma, uncomplicated: Secondary | ICD-10-CM | POA: Diagnosis not present

## 2023-04-08 DIAGNOSIS — Z7901 Long term (current) use of anticoagulants: Secondary | ICD-10-CM | POA: Diagnosis not present

## 2023-04-09 ENCOUNTER — Other Ambulatory Visit: Payer: Self-pay | Admitting: Nurse Practitioner

## 2023-04-09 DIAGNOSIS — D5700 Hb-SS disease with crisis, unspecified: Secondary | ICD-10-CM | POA: Diagnosis not present

## 2023-04-10 ENCOUNTER — Other Ambulatory Visit (HOSPITAL_BASED_OUTPATIENT_CLINIC_OR_DEPARTMENT_OTHER): Payer: Self-pay

## 2023-04-10 ENCOUNTER — Other Ambulatory Visit: Payer: Self-pay

## 2023-04-10 DIAGNOSIS — D5700 Hb-SS disease with crisis, unspecified: Secondary | ICD-10-CM | POA: Diagnosis not present

## 2023-04-10 MED ORDER — HYDROMORPHONE HCL 4 MG PO TABS
4.0000 mg | ORAL_TABLET | ORAL | 0 refills | Status: DC
  Filled 2023-04-10 (×2): qty 60, 10d supply, fill #0

## 2023-04-11 ENCOUNTER — Other Ambulatory Visit: Payer: Self-pay

## 2023-04-17 ENCOUNTER — Telehealth: Payer: Self-pay

## 2023-04-18 NOTE — Transitions of Care (Post Inpatient/ED Visit) (Signed)
04/18/2023  Name: Molly Gutierrez MRN: [id number] DOB: [birth year]  Today's TOC FU Call Status: Today's TOC FU Call Status:: Successful TOC FU Call Completed TOC FU Call Complete Date: 04/17/23 Patient's Name and Date of Birth confirmed.  Transition Care Management Follow-up Telephone Call Date of Discharge: 04/14/23 Discharge Facility: Other ([facility]) Name of Other ([facility]) [facility]charge Facility: [facility] Type of Discharge: Inpatient Admission Primary Inpatient Discharge Diagnosis:: Sickle Cell Crisis How have you been since you were released from the hospital?: Better (" Feeling better - improving each day." . Pain level is back to her normal baseline.  "Just. glad to be home with my new puppy") Any questions or concerns?: No  Items Reviewed: Did you receive and understand the discharge instructions provided?: Yes Medications obtained,verified, and reconciled?: Yes (Medications Reviewed) Any new allergies since your discharge?: No Dietary orders reviewed?: Yes Type of Diet Ordered:: Resume regular diet Do you have support at home?: Yes People in Home: child(ren), dependent Name of Support/Comfort Primary Source: Has a very supportive family - including Mother, father and siblings who are readily accessible to her if needed  Medications Reviewed Today: Medications Reviewed Today     Reviewed by Wyline Mood, RN (Case Manager) on 04/18/23 at 0151  Med List Status: <None>   Medication Order Taking? Sig Documenting Provider Last Dose Status Informant  acetaminophen (TYLENOL) 500 MG tablet [id number] Yes Take 500 mg by mouth every 6 (six) hours as needed for mild pain or headache (or cramps). [provider] Taking Active Self  albuterol (VENTOLIN HFA) 108 (90 Base) MCG/ACT inhaler [id number] Yes TAKE 2 PUFFS BY MOUTH EVERY 6 HOURS AS NEEDED FOR WHEEZE OR SHORTNESS OF BREATH  Patient taking differently: Inhale 2 puffs into the lungs every 6 (six) hours  as needed for wheezing or shortness of breath.   [name], NP Taking Active Self  budesonide-formoterol (SYMBICORT) 80-4.5 MCG/ACT inhaler [id number] Yes INHALE 2 PUFFS INTO THE LUNGS TWICE A DAY  Patient taking differently: Inhale 2 puffs into the lungs 2 (two) times daily.   [name], NP Taking Active Self  Cholecalciferol (VITAMIN D3 PO) [id number] Yes Take 1 capsule by mouth at bedtime. [provider] Taking Active Self           Med Note Antony Madura, Linna Darner Mar 04, 2023  5:51 PM) Strength not noted  Cyanocobalamin (VITAMIN B-12 PO) [id number] Yes Take 1 tablet by mouth at bedtime. [provider] Taking Active Self           Med Note Antony Madura, Arn Medal   Tue Mar 04, 2023  5:51 PM) Strength not noted  Deferasirox (JADENU) 360 MG TABS [id number] Yes Take 3 tablets (1,080 mg total) by mouth daily.  Patient taking differently: Take 1,080 mg by mouth at bedtime.   [name], NP Taking Active Self           Med Note Antony Madura, Linna Darner Mar 04, 2023  5:53 PM) The patient said she is now "OUT OF THESE" as of last week. It is requiring a PRIOR APPROVAL, per her insurance.  diphenhydrAMINE (BENADRYL) 25 MG tablet [id number] Yes Take 25 mg by mouth See admin instructions. Take 25 mg by mouth every four to six hours with each dose of Hydromorphone [provider] Taking Active Self           Med Note [name], REGEENA   Thu Oct 05, 2020  5:11 PM)  fluticasone (FLONASE) 50 MCG/ACT nasal spray [id number]  Place 1 spray into both nostrils daily as needed for allergies or rhinitis. [name], NP  Expired 03/04/23 2359 Self  folic acid (FOLVITE) 1 MG tablet [id number] Yes Take 1 tablet (1 mg total) by mouth daily. [name], NP Taking Active Self  HYDROmorphone (DILAUDID) 4 MG tablet [id number] Yes Take 1 tablet (4 mg total) by mouth every 4 to 6 hours as needed for pain [name], NP Taking Active   mirtazapine (REMERON) 45 MG tablet [id number]  Yes Take 1 tablet (45 mg total) by mouth at bedtime. [name], NP Taking Active Self  naloxone [facility]) nasal spray 4 mg/0.1 mL [id number] Yes Place 0.1 sprays (0.4 mg total) into the nose once as needed (opioid overdose). [name], NP Taking Active Self  OXBRYTA 500 MG TABS tablet [id number] No Take 1,500 mg by mouth daily.  Patient not taking: Reported on 03/04/2023   [name], NP Not Taking Active Self           Med Note Wyline Mood   Fri Apr 18, 2023  1:49 AM) Reports PCP called her mid October and requested she stop taking the medication due to newly reported adverse effects.  States she was weaned off the [place] and no longer taking it  polyethylene glycol (MIRALAX / GLYCOLAX) 17 g packet [id number] Yes Take 17 g by mouth daily as needed for mild constipation (MIX AS DIRECTED AND DRINK). [name], NP Taking Active Self  promethazine (PHENERGAN) 25 MG tablet [id number]  Take 0.5-1 tablets (12.5-25 mg total) by mouth every 6 (six) hours as needed for nausea or vomiting. [name], NP  Expired 03/04/23 2359 Self  Vitamin D, Ergocalciferol, (DRISDOL) 1.25 MG (50000 UNIT) CAPS capsule [id number] Yes Take 1 capsule (50,000 Units total) by mouth once a week.  Patient taking differently: Take 50,000 Units by mouth every Wednesday.   [name], NP Taking Active Self  XARELTO 20 MG TABS tablet [id number]  Take 1 tablet (20 mg total) by mouth daily with supper.  Patient taking differently: Take 20 mg by mouth at bedtime.   [name], NP  Expired 03/04/23 2359 Self           Med Note Malachy Moan Mar 04, 2023  5:26 PM)    Med List Note Salvatore Marvel, [place] 04/02/22 1640): The "k" is in the patient's first name is SILENT              Home Care and Equipment/Supplies: Were Home Health Services Ordered?: No Any new equipment or medical supplies ordered?: No  Functional Questionnaire: Do you need assistance with bathing/showering or  dressing?: No Do you need assistance with meal preparation?: No Do you need assistance with eating?: No Do you have difficulty maintaining continence: No Do you need assistance with getting out of bed/getting out of a chair/moving?: No Do you have difficulty managing or taking your medications?: No  Follow up appointments reviewed: PCP Follow-up appointment confirmed?: Yes (Outreach in real-time with scheduling care guide who was able to successfully schedule post hospitalization PCP appointment, for 04/23/2023 @ 11:20am.  Patient verb understanding and is agreeable to appointment with Angus Seller.) MD Provider Line Number:[phone] Given: No Date of PCP follow-up appointment?: 04/23/23 Follow-up Provider: [name], NP Specialist Hospital Follow-up appointment confirmed?: NA Do you need transportation to your follow-up appointment?: No (Able to drive self)  Do you understand care options if your condition(s) worsen?: Yes-patient verbalized understanding (Patient is very knowlegable re: Sickle Cell Disease.  Serves as an advocate and/or support person for people w/sickle cell  and their families.)  SDOH Interventions Today    Flowsheet Row Most Recent Value  SDOH Interventions   Food Insecurity Interventions Intervention Not Indicated  Transportation Interventions Intervention Not Indicated

## 2023-04-20 ENCOUNTER — Other Ambulatory Visit: Payer: Self-pay | Admitting: Nurse Practitioner

## 2023-04-20 DIAGNOSIS — D5700 Hb-SS disease with crisis, unspecified: Secondary | ICD-10-CM

## 2023-04-21 ENCOUNTER — Other Ambulatory Visit: Payer: Self-pay | Admitting: Nurse Practitioner

## 2023-04-21 DIAGNOSIS — D5700 Hb-SS disease with crisis, unspecified: Secondary | ICD-10-CM

## 2023-04-21 NOTE — Telephone Encounter (Signed)
Please advise Kh

## 2023-04-22 ENCOUNTER — Other Ambulatory Visit: Payer: Self-pay

## 2023-04-22 ENCOUNTER — Encounter (HOSPITAL_COMMUNITY): Payer: Self-pay

## 2023-04-22 ENCOUNTER — Inpatient Hospital Stay (HOSPITAL_COMMUNITY)
Admission: EM | Admit: 2023-04-22 | Discharge: 2023-04-27 | DRG: 812 | Disposition: A | Discharge status: Home / Self Care | Payer: Medicare HMO | Attending: Internal Medicine | Admitting: Internal Medicine

## 2023-04-22 ENCOUNTER — Emergency Department (HOSPITAL_COMMUNITY): Payer: Medicare HMO

## 2023-04-22 DIAGNOSIS — Z83438 Family history of other disorder of lipoprotein metabolism and other lipidemia: Secondary | ICD-10-CM

## 2023-04-22 DIAGNOSIS — Z888 Allergy status to other drugs, medicaments and biological substances status: Secondary | ICD-10-CM

## 2023-04-22 DIAGNOSIS — Z833 Family history of diabetes mellitus: Secondary | ICD-10-CM

## 2023-04-22 DIAGNOSIS — D638 Anemia in other chronic diseases classified elsewhere: Secondary | ICD-10-CM | POA: Diagnosis present

## 2023-04-22 DIAGNOSIS — F1120 Opioid dependence, uncomplicated: Secondary | ICD-10-CM | POA: Diagnosis not present

## 2023-04-22 DIAGNOSIS — M549 Dorsalgia, unspecified: Secondary | ICD-10-CM | POA: Diagnosis not present

## 2023-04-22 DIAGNOSIS — Z7901 Long term (current) use of anticoagulants: Secondary | ICD-10-CM

## 2023-04-22 DIAGNOSIS — R079 Chest pain, unspecified: Secondary | ICD-10-CM | POA: Diagnosis not present

## 2023-04-22 DIAGNOSIS — Z86711 Personal history of pulmonary embolism: Secondary | ICD-10-CM | POA: Diagnosis not present

## 2023-04-22 DIAGNOSIS — Z8249 Family history of ischemic heart disease and other diseases of the circulatory system: Secondary | ICD-10-CM

## 2023-04-22 DIAGNOSIS — D72829 Elevated white blood cell count, unspecified: Secondary | ICD-10-CM | POA: Diagnosis not present

## 2023-04-22 DIAGNOSIS — J4520 Mild intermittent asthma, uncomplicated: Secondary | ICD-10-CM | POA: Diagnosis present

## 2023-04-22 DIAGNOSIS — D5700 Hb-SS disease with crisis, unspecified: Secondary | ICD-10-CM | POA: Diagnosis not present

## 2023-04-22 DIAGNOSIS — Z841 Family history of disorders of kidney and ureter: Secondary | ICD-10-CM | POA: Diagnosis not present

## 2023-04-22 DIAGNOSIS — Z881 Allergy status to other antibiotic agents status: Secondary | ICD-10-CM

## 2023-04-22 DIAGNOSIS — E876 Hypokalemia: Secondary | ICD-10-CM | POA: Diagnosis not present

## 2023-04-22 DIAGNOSIS — M25512 Pain in left shoulder: Secondary | ICD-10-CM | POA: Diagnosis not present

## 2023-04-22 DIAGNOSIS — G894 Chronic pain syndrome: Secondary | ICD-10-CM | POA: Diagnosis not present

## 2023-04-22 DIAGNOSIS — Z7951 Long term (current) use of inhaled steroids: Secondary | ICD-10-CM | POA: Diagnosis not present

## 2023-04-22 DIAGNOSIS — R0789 Other chest pain: Secondary | ICD-10-CM | POA: Diagnosis not present

## 2023-04-22 LAB — CBC WITH DIFFERENTIAL/PLATELET
Abs Immature Granulocytes: 0.04 10*3/uL (ref 0.00–0.07)
Basophils Absolute: 0.1 10*3/uL (ref 0.0–0.1)
Basophils Relative: 1 %
Eosinophils Absolute: 0.1 10*3/uL (ref 0.0–0.5)
Eosinophils Relative: 1 %
HCT: 24.9 % — ABNORMAL LOW (ref 36.0–46.0)
Hemoglobin: 8.3 g/dL — ABNORMAL LOW (ref 12.0–15.0)
Immature Granulocytes: 0 %
Lymphocytes Relative: 21 %
Lymphs Abs: 2.9 10*3/uL (ref 0.7–4.0)
MCH: 30.9 pg (ref 26.0–34.0)
MCHC: 33.3 g/dL (ref 30.0–36.0)
MCV: 92.6 fL (ref 80.0–100.0)
Monocytes Absolute: 1.8 10*3/uL — ABNORMAL HIGH (ref 0.1–1.0)
Monocytes Relative: 13 %
Neutro Abs: 8.9 10*3/uL — ABNORMAL HIGH (ref 1.7–7.7)
Neutrophils Relative %: 64 %
Platelets: 588 10*3/uL — ABNORMAL HIGH (ref 150–400)
RBC: 2.69 MIL/uL — ABNORMAL LOW (ref 3.87–5.11)
RDW: 18 % — ABNORMAL HIGH (ref 11.5–15.5)
WBC: 13.7 10*3/uL — ABNORMAL HIGH (ref 4.0–10.5)
nRBC: 0.1 % (ref 0.0–0.2)

## 2023-04-22 LAB — RETICULOCYTES
Immature Retic Fract: 10.1 % (ref 2.3–15.9)
RBC.: 2.62 MIL/uL — ABNORMAL LOW (ref 3.87–5.11)
Retic Count, Absolute: 166.1 10*3/uL (ref 19.0–186.0)
Retic Ct Pct: 6.3 % — ABNORMAL HIGH (ref 0.4–3.1)

## 2023-04-22 LAB — COMPREHENSIVE METABOLIC PANEL
ALT: 14 U/L (ref 0–44)
AST: 28 U/L (ref 15–41)
Albumin: 4.7 g/dL (ref 3.5–5.0)
Alkaline Phosphatase: 53 U/L (ref 38–126)
Anion gap: 10 (ref 5–15)
BUN: 9 mg/dL (ref 6–20)
CO2: 23 mmol/L (ref 22–32)
Calcium: 9 mg/dL (ref 8.9–10.3)
Chloride: 102 mmol/L (ref 98–111)
Creatinine, Ser: 0.51 mg/dL (ref 0.44–1.00)
GFR, Estimated: >60 mL/min (ref >60)
Glucose, Bld: 98 mg/dL (ref 70–99)
Potassium: 3.1 mmol/L — ABNORMAL LOW (ref 3.5–5.1)
Sodium: 135 mmol/L (ref 135–145)
Total Bilirubin: 3.6 mg/dL — ABNORMAL HIGH (ref <1.2)
Total Protein: 8.5 g/dL — ABNORMAL HIGH (ref 6.5–8.1)

## 2023-04-22 LAB — HCG, SERUM, QUALITATIVE: Preg, Serum: NEGATIVE

## 2023-04-22 MED ORDER — HYDROMORPHONE HCL 2 MG/ML IJ SOLN
2.0000 mg | Freq: Once | INTRAMUSCULAR | Status: AC
  Administered 2023-04-22: 2 mg via INTRAVENOUS
  Filled 2023-04-22: qty 1

## 2023-04-22 MED ORDER — HYDROMORPHONE HCL 1 MG/ML IJ SOLN
1.0000 mg | Freq: Once | INTRAMUSCULAR | Status: DC
  Administered 2023-04-22: 1 mg via SUBCUTANEOUS
  Filled 2023-04-22: qty 1

## 2023-04-22 MED ORDER — DIPHENHYDRAMINE HCL 50 MG/ML IJ SOLN
25.0000 mg | Freq: Once | INTRAMUSCULAR | Status: AC
  Administered 2023-04-22: 25 mg via INTRAVENOUS
  Filled 2023-04-22: qty 1

## 2023-04-22 NOTE — ED Triage Notes (Signed)
Pt reports with [facility] that started Sunday. Pt reports pain in her chest and back. Last medication taken this morning Hydromorphone 4 mg.

## 2023-04-22 NOTE — Telephone Encounter (Signed)
Please refuse  per last denial. [facility]

## 2023-04-22 NOTE — ED Notes (Signed)
Pt would like her port accessed for labs and medication.

## 2023-04-22 NOTE — ED Provider Notes (Incomplete)
[place] EMERGENCY DEPARTMENT AT [facility] Provider Note   CSN: [id number] Arrival date & time: 04/22/23  1857     History {Add pertinent medical, surgical, social history, OB history to HPI:1} Chief Complaint  Patient presents with   Sickle Cell Pain Crisis    Molly Gutierrez is a 42 y.o. female.  With a past medical history of sickle cell disease who presents to the ED for sickle cell pain crisis.  Current pain crisis began 2 days ago and has persisted into the onset.  Feels similar to prior episodes of vaso-occlusive crisis with chest pain, shoulder pain, abdominal pain.  Was seen here recently in the ED with similar presentationOn October 21.  No fevers chills or recent illness.   Sickle Cell Pain Crisis      Home Medications Prior to Admission medications   Medication Sig Start Date End Date Taking? Authorizing Provider  acetaminophen (TYLENOL) 500 MG tablet Take 500 mg by mouth every 6 (six) hours as needed for mild pain or headache (or cramps).    [provider]  albuterol (VENTOLIN HFA) 108 (90 Base) MCG/ACT inhaler TAKE 2 PUFFS BY MOUTH EVERY 6 HOURS AS NEEDED FOR WHEEZE OR SHORTNESS OF BREATH Patient taking differently: Inhale 2 puffs into the lungs every 6 (six) hours as needed for wheezing or shortness of breath. 01/22/23   [name], NP  budesonide-formoterol (SYMBICORT) 80-4.5 MCG/ACT inhaler INHALE 2 PUFFS INTO THE LUNGS TWICE A DAY Patient taking differently: Inhale 2 puffs into the lungs 2 (two) times daily. 01/27/23   [name], NP  Cholecalciferol (VITAMIN D3 PO) Take 1 capsule by mouth at bedtime.    [provider]  Cyanocobalamin (VITAMIN B-12 PO) Take 1 tablet by mouth at bedtime.    [provider]  Deferasirox (JADENU) 360 MG TABS Take 3 tablets (1,080 mg total) by mouth daily. Patient taking differently: Take 1,080 mg by mouth at bedtime. 02/05/23   [name], NP  diphenhydrAMINE (BENADRYL)  25 MG tablet Take 25 mg by mouth See admin instructions. Take 25 mg by mouth every four to six hours with each dose of Hydromorphone    [provider]  fluticasone (FLONASE) 50 MCG/ACT nasal spray Place 1 spray into both nostrils daily as needed for allergies or rhinitis. 12/31/21 03/04/23  [name], NP  folic acid (FOLVITE) 1 MG tablet Take 1 tablet (1 mg total) by mouth daily. 12/23/22   [name], NP  HYDROmorphone (DILAUDID) 4 MG tablet Take 1 tablet (4 mg total) by mouth every 4 to 6 hours as needed for pain 04/10/23   [name], NP  mirtazapine (REMERON) 45 MG tablet Take 1 tablet (45 mg total) by mouth at bedtime. 12/23/22 12/23/23  [name], NP  naloxone [facility]) nasal spray 4 mg/0.1 mL Place 0.1 sprays (0.4 mg total) into the nose once as needed (opioid overdose). 12/23/22   [name], NP  OXBRYTA 500 MG TABS tablet Take 1,500 mg by mouth daily. Patient not taking: Reported on 03/04/2023 02/05/23   [name], NP  polyethylene glycol (MIRALAX / GLYCOLAX) 17 g packet Take 17 g by mouth daily as needed for mild constipation (MIX AS DIRECTED AND DRINK). 12/23/22   [name], NP  promethazine (PHENERGAN) 25 MG tablet Take 0.5-1 tablets (12.5-25 mg total) by mouth every 6 (six) hours as needed for nausea or vomiting. 10/31/21 03/04/23  [name], NP  Vitamin  D, Ergocalciferol, (DRISDOL) 1.25 MG (50000 UNIT) CAPS capsule Take 1 capsule (50,000 Units total) by mouth once a week. Patient taking differently: Take 50,000 Units by mouth every Wednesday. 12/23/22 12/23/23  [name], NP  XARELTO 20 MG TABS tablet Take 1 tablet (20 mg total) by mouth daily with supper. Patient taking differently: Take 20 mg by mouth at bedtime. 10/29/21 03/04/23  [name], NP      Allergies    Cefaclor, Hydroxyurea, Omeprazole, and Ketamine    Review of Systems   Review of Systems  Physical Exam Updated Vital Signs BP 122/67   Pulse 67   Temp 98.7 F  (37.1 C) (Oral)   Resp 16   Ht 5\' 3"  (1.6 m)   Wt 50.4 kg   SpO2 100%   BMI 19.68 kg/m  Physical Exam Vitals and nursing note reviewed.  HENT:     Head: Normocephalic and atraumatic.  Eyes:     Pupils: Pupils are equal, round, and reactive to light.  Cardiovascular:     Rate and Rhythm: Normal rate and regular rhythm.  Pulmonary:     Effort: Pulmonary effort is normal.     Breath sounds: Normal breath sounds.  Abdominal:     Palpations: Abdomen is soft.     Tenderness: There is no abdominal tenderness.  Skin:    General: Skin is warm and dry.  Neurological:     Mental Status: She is alert.  Psychiatric:        Mood and Affect: Mood normal.     ED Results / Procedures / Treatments   Labs (all labs ordered are listed, but only abnormal results are displayed) Labs Reviewed  COMPREHENSIVE METABOLIC PANEL - Abnormal; Notable for the following components:      Result Value   Potassium 3.1 (*)    Total Protein 8.5 (*)    Total Bilirubin 3.6 (*)    All other components within normal limits  CBC WITH DIFFERENTIAL/PLATELET - Abnormal; Notable for the following components:   WBC 13.7 (*)    RBC 2.69 (*)    Hemoglobin 8.3 (*)    HCT 24.9 (*)    RDW 18.0 (*)    Platelets 588 (*)    Neutro Abs 8.9 (*)    Monocytes Absolute 1.8 (*)    All other components within normal limits  RETICULOCYTES - Abnormal; Notable for the following components:   Retic Ct Pct 6.3 (*)    RBC. 2.62 (*)    All other components within normal limits  HCG, SERUM, QUALITATIVE    EKG None  Radiology DG Chest Portable 1 View  Result Date: 04/22/2023 CLINICAL DATA:  Chest and back pain EXAM: PORTABLE CHEST 1 VIEW COMPARISON:  12/23/2022 FINDINGS: Single frontal view of the chest demonstrate stable bilateral chest wall port. Cardiac silhouette is unremarkable. No acute airspace disease, effusion, or pneumothorax. No acute bony abnormality. IMPRESSION: 1. Stable chest, no acute process. Electronically  Signed   By: Sharlet Salina M.D.   On: 04/22/2023 21:21    Procedures Procedures  {Document cardiac monitor, telemetry assessment procedure when appropriate:1}  Medications Ordered in ED Medications  diphenhydrAMINE (BENADRYL) injection 25 mg (has no administration in time range)  HYDROmorphone (DILAUDID) injection 2 mg (has no administration in time range)    ED Course/ Medical Decision Making/ A&P   {   Click here for ABCD2, HEART and other calculatorsREFRESH Note before signing :1}  Medical Decision Making 42 year old female with history of sickle cell presenting for vaso-occlusive crisis.  Seen here recently with similar presentation.  Chest pain back pain abdominal pain.  Will evaluate for acute chest with chest x-ray and obtain laboratory workup for sickle cell crisis. P.o. Dilaudid was ineffective at home.  Was treated with Dilaudid and Benadryl last time.   Will try these medications again and reevaluate  Amount and/or Complexity of Data Reviewed Labs: ordered. Radiology: ordered.  Risk Prescription drug management.   ***  {Document critical care time when appropriate:1} {Document review of labs and clinical decision tools ie heart score, Chads2Vasc2 etc:1}  {Document your independent review of radiology images, and any outside records:1} {Document your discussion with family members, caretakers, and with consultants:1} {Document social determinants of health affecting pt's care:1} {Document your decision making why or why not admission, treatments were needed:1} Final Clinical Impression(s) / ED Diagnoses Final diagnoses:  None    Rx / DC Orders ED Discharge Orders     None

## 2023-04-23 ENCOUNTER — Inpatient Hospital Stay: Payer: Self-pay | Admitting: Nurse Practitioner

## 2023-04-23 DIAGNOSIS — J4520 Mild intermittent asthma, uncomplicated: Secondary | ICD-10-CM | POA: Diagnosis present

## 2023-04-23 DIAGNOSIS — D638 Anemia in other chronic diseases classified elsewhere: Secondary | ICD-10-CM | POA: Diagnosis present

## 2023-04-23 DIAGNOSIS — Z833 Family history of diabetes mellitus: Secondary | ICD-10-CM | POA: Diagnosis not present

## 2023-04-23 DIAGNOSIS — Z7951 Long term (current) use of inhaled steroids: Secondary | ICD-10-CM | POA: Diagnosis not present

## 2023-04-23 DIAGNOSIS — Z841 Family history of disorders of kidney and ureter: Secondary | ICD-10-CM | POA: Diagnosis not present

## 2023-04-23 DIAGNOSIS — Z881 Allergy status to other antibiotic agents status: Secondary | ICD-10-CM | POA: Diagnosis not present

## 2023-04-23 DIAGNOSIS — D5700 Hb-SS disease with crisis, unspecified: Secondary | ICD-10-CM | POA: Diagnosis present

## 2023-04-23 DIAGNOSIS — D72829 Elevated white blood cell count, unspecified: Secondary | ICD-10-CM | POA: Diagnosis present

## 2023-04-23 DIAGNOSIS — Z888 Allergy status to other drugs, medicaments and biological substances status: Secondary | ICD-10-CM | POA: Diagnosis not present

## 2023-04-23 DIAGNOSIS — Z8249 Family history of ischemic heart disease and other diseases of the circulatory system: Secondary | ICD-10-CM | POA: Diagnosis not present

## 2023-04-23 DIAGNOSIS — G894 Chronic pain syndrome: Secondary | ICD-10-CM | POA: Diagnosis present

## 2023-04-23 DIAGNOSIS — Z83438 Family history of other disorder of lipoprotein metabolism and other lipidemia: Secondary | ICD-10-CM | POA: Diagnosis not present

## 2023-04-23 DIAGNOSIS — Z7901 Long term (current) use of anticoagulants: Secondary | ICD-10-CM | POA: Diagnosis not present

## 2023-04-23 DIAGNOSIS — E876 Hypokalemia: Secondary | ICD-10-CM | POA: Diagnosis present

## 2023-04-23 DIAGNOSIS — Z86711 Personal history of pulmonary embolism: Secondary | ICD-10-CM | POA: Diagnosis not present

## 2023-04-23 DIAGNOSIS — F1120 Opioid dependence, uncomplicated: Secondary | ICD-10-CM | POA: Diagnosis present

## 2023-04-23 LAB — CBC
HCT: 22.6 % — ABNORMAL LOW (ref 36.0–46.0)
Hemoglobin: 7.5 g/dL — ABNORMAL LOW (ref 12.0–15.0)
MCH: 31.3 pg (ref 26.0–34.0)
MCHC: 33.2 g/dL (ref 30.0–36.0)
MCV: 94.2 fL (ref 80.0–100.0)
Platelets: 506 10*3/uL — ABNORMAL HIGH (ref 150–400)
RBC: 2.4 MIL/uL — ABNORMAL LOW (ref 3.87–5.11)
RDW: 18.2 % — ABNORMAL HIGH (ref 11.5–15.5)
WBC: 13.2 10*3/uL — ABNORMAL HIGH (ref 4.0–10.5)
nRBC: 0.2 % (ref 0.0–0.2)

## 2023-04-23 LAB — COMPREHENSIVE METABOLIC PANEL
ALT: 13 U/L (ref 0–44)
AST: 29 U/L (ref 15–41)
Albumin: 4.1 g/dL (ref 3.5–5.0)
Alkaline Phosphatase: 48 U/L (ref 38–126)
Anion gap: 3 — ABNORMAL LOW (ref 5–15)
BUN: 8 mg/dL (ref 6–20)
CO2: 24 mmol/L (ref 22–32)
Calcium: 8.5 mg/dL — ABNORMAL LOW (ref 8.9–10.3)
Chloride: 105 mmol/L (ref 98–111)
Creatinine, Ser: 0.59 mg/dL (ref 0.44–1.00)
GFR, Estimated: >60 mL/min (ref >60)
Glucose, Bld: 92 mg/dL (ref 70–99)
Potassium: 4.1 mmol/L (ref 3.5–5.1)
Sodium: 132 mmol/L — ABNORMAL LOW (ref 135–145)
Total Bilirubin: 3.1 mg/dL — ABNORMAL HIGH (ref <1.2)
Total Protein: 7.9 g/dL (ref 6.5–8.1)

## 2023-04-23 LAB — MAGNESIUM: Magnesium: 2.2 mg/dL (ref 1.7–2.4)

## 2023-04-23 LAB — PHOSPHORUS: Phosphorus: 4.1 mg/dL (ref 2.5–4.6)

## 2023-04-23 MED ORDER — CHLORHEXIDINE GLUCONATE CLOTH 2 % EX PADS
6.0000 | MEDICATED_PAD | Freq: Every day | CUTANEOUS | Status: DC
  Administered 2023-04-23 – 2023-04-25 (×3): 6 via TOPICAL

## 2023-04-23 MED ORDER — RIVAROXABAN 20 MG PO TABS
20.0000 mg | ORAL_TABLET | Freq: Every day | ORAL | Status: DC
Start: 2023-04-23 — End: 2023-04-24
  Administered 2023-04-23 (×2): 20 mg via ORAL
  Filled 2023-04-23 (×2): qty 1

## 2023-04-23 MED ORDER — NALOXONE HCL 0.4 MG/ML IJ SOLN: 0.4000 mg | INTRAMUSCULAR | Status: DC | PRN

## 2023-04-23 MED ORDER — MOMETASONE FURO-FORMOTEROL FUM 100-5 MCG/ACT IN AERO
2.0000 | INHALATION_SPRAY | Freq: Two times a day (BID) | RESPIRATORY_TRACT | Status: DC
  Administered 2023-04-23 – 2023-04-27 (×8): 2 via RESPIRATORY_TRACT
  Filled 2023-04-23: qty 8.8

## 2023-04-23 MED ORDER — SODIUM CHLORIDE 0.9% FLUSH: 9.0000 mL | INTRAVENOUS | Status: DC | PRN

## 2023-04-23 MED ORDER — ACETAMINOPHEN 325 MG PO TABS: 650.0000 mg | ORAL_TABLET | Freq: Four times a day (QID) | ORAL | Status: DC | PRN

## 2023-04-23 MED ORDER — SODIUM CHLORIDE 0.45 % IV SOLN
INTRAVENOUS | Status: AC
Start: 2023-04-23 — End: 2023-04-24

## 2023-04-23 MED ORDER — DIPHENHYDRAMINE HCL 50 MG/ML IJ SOLN
12.5000 mg | Freq: Four times a day (QID) | INTRAMUSCULAR | Status: DC | PRN
  Administered 2023-04-23 – 2023-04-27 (×17): 12.5 mg via INTRAVENOUS
  Filled 2023-04-23 (×19): qty 1

## 2023-04-23 MED ORDER — MIRTAZAPINE 30 MG PO TABS
45.0000 mg | ORAL_TABLET | Freq: Every day | ORAL | Status: DC
  Administered 2023-04-23 – 2023-04-26 (×4): 45 mg via ORAL
  Filled 2023-04-23 (×4): qty 1

## 2023-04-23 MED ORDER — MELATONIN 5 MG PO TABS: 5.0000 mg | ORAL_TABLET | Freq: Every evening | ORAL | Status: DC | PRN

## 2023-04-23 MED ORDER — POTASSIUM CHLORIDE CRYS ER 20 MEQ PO TBCR
20.0000 meq | EXTENDED_RELEASE_TABLET | Freq: Three times a day (TID) | ORAL | Status: AC
  Administered 2023-04-23 (×3): 20 meq via ORAL
  Filled 2023-04-23 (×3): qty 1

## 2023-04-23 MED ORDER — ENOXAPARIN SODIUM 40 MG/0.4ML IJ SOSY: 40.0000 mg | PREFILLED_SYRINGE | INTRAMUSCULAR | Status: DC

## 2023-04-23 MED ORDER — FOLIC ACID 1 MG PO TABS
1.0000 mg | ORAL_TABLET | Freq: Every day | ORAL | Status: DC
  Administered 2023-04-23 – 2023-04-27 (×5): 1 mg via ORAL
  Filled 2023-04-23 (×5): qty 1

## 2023-04-23 MED ORDER — POTASSIUM CHLORIDE 20 MEQ PO PACK
40.0000 meq | PACK | Freq: Once | ORAL | Status: AC
  Administered 2023-04-23: 40 meq via ORAL
  Filled 2023-04-23: qty 2

## 2023-04-23 MED ORDER — ALBUTEROL SULFATE HFA 108 (90 BASE) MCG/ACT IN AERS: 2.0000 | INHALATION_SPRAY | Freq: Four times a day (QID) | RESPIRATORY_TRACT | Status: DC | PRN

## 2023-04-23 MED ORDER — ALBUTEROL SULFATE (2.5 MG/3ML) 0.083% IN NEBU: 2.5000 mg | INHALATION_SOLUTION | Freq: Four times a day (QID) | RESPIRATORY_TRACT | Status: DC | PRN

## 2023-04-23 MED ORDER — HYDROMORPHONE HCL 2 MG/ML IJ SOLN
2.0000 mg | Freq: Once | INTRAMUSCULAR | Status: AC
  Administered 2023-04-23: 2 mg via INTRAVENOUS
  Filled 2023-04-23: qty 1

## 2023-04-23 MED ORDER — PROCHLORPERAZINE EDISYLATE 10 MG/2ML IJ SOLN: 5.0000 mg | Freq: Four times a day (QID) | INTRAMUSCULAR | Status: DC | PRN

## 2023-04-23 MED ORDER — POLYETHYLENE GLYCOL 3350 17 G PO PACK: 17.0000 g | PACK | Freq: Every day | ORAL | Status: DC | PRN

## 2023-04-23 MED ORDER — HYDROMORPHONE HCL 1 MG/ML IJ SOLN
1.0000 mg | INTRAMUSCULAR | Status: DC | PRN
  Administered 2023-04-23 (×2): 1 mg via INTRAVENOUS
  Filled 2023-04-23 (×2): qty 1

## 2023-04-23 MED ORDER — HYDROMORPHONE 1 MG/ML IV SOLN
INTRAVENOUS | Status: DC
Start: 2023-04-23 — End: 2023-04-25
  Administered 2023-04-23: 30 mg via INTRAVENOUS
  Administered 2023-04-23: 3 mg via INTRAVENOUS
  Administered 2023-04-23 – 2023-04-24 (×2): 5 mg via INTRAVENOUS
  Administered 2023-04-24: 8 mg via INTRAVENOUS
  Administered 2023-04-24: 30 mg via INTRAVENOUS
  Administered 2023-04-24 (×2): 3 mg via INTRAVENOUS
  Administered 2023-04-24: 2 mg via INTRAVENOUS
  Administered 2023-04-25: 8 mg via INTRAVENOUS
  Administered 2023-04-25: 2 mg via INTRAVENOUS
  Administered 2023-04-25: 3 mg via INTRAVENOUS
  Filled 2023-04-23 (×3): qty 30

## 2023-04-23 NOTE — H&P (Addendum)
History and Physical  [facility] ZOX:[id number] DOB: Jun 22, 1980 DOA: 04/22/2023  Referring physician: Dr. Bebe Shaggy, EDP  PCP: [name], NP  Outpatient Specialists: Sickle cell clinic. Patient coming from: Home.  Chief Complaint: Sickle cell pain crisis.  HPI: Molly Gutierrez is a 42 y.o. female with medical history significant for pulmonary embolism on Xarelto, asthma, sickle cell anemia, and recurrent hospitalization for sickle cell pain crisis, who presents to the ED with complaints of pain in her chest, shoulders, and back not improved with home oral narcotics.  The pain is similar to previous sickle cell pain crisis.  In the ED, chest x-ray is non-acute.  No evidence of acute ischemia on 12-lead EKG.  The patient received several rounds of IV opioid-based narcotics with mild improvement.  Still reporting uncontrolled pain.  TRH, hospitalist service, was asked to admit for pain control.  ED Course: Temperature 97.7.  BP 95/76, pulse rate 68, respiratory rate 17, O2 saturation 100% on room air.  Lab studies notable for serum potassium 3.1, T bilirubin 3.6.  WBC 13.7, hemoglobin 8.3, platelet count 588.  Review of Systems: Review of systems as noted in the HPI. All other systems reviewed and are negative.   Past Medical History:  Diagnosis Date   Asthma    Eczema    History of pulmonary embolus (PE)    Sickle cell anemia (HCC)    Past Surgical History:  Procedure Laterality Date   CHOLECYSTECTOMY     ERCP     JOINT REPLACEMENT     PORTA CATH INSERTION     TUBAL LIGATION     WISDOM TOOTH EXTRACTION      Social History:  reports that she has never smoked. She has never used smokeless tobacco. She reports that she does not drink alcohol and does not use drugs.   Allergies  Allergen Reactions   Cefaclor Hives, Swelling and Rash   Hydroxyurea Palpitations and Other (See Comments)    Lowers "blood levels" and heart rate (causes HYPOtension); "it messes  me up, it drops my levels and stuff" and Hypotension    Omeprazole Other (See Comments)    Causes "sharp pains in the stomach"   Ketamine Palpitations and Other (See Comments)    "Pt states she has had previous reaction to ketamine. States she becomes flushed, heart races, dizzy, and feels like she is going to pass out."    Family History  Problem Relation Age of Onset   Renal Disease Mother    Hypertension Mother    High Cholesterol Mother    Heart attack Mother    Diabetes Brother       Prior to Admission medications   Medication Sig Start Date End Date Taking? Authorizing Provider  acetaminophen (TYLENOL) 500 MG tablet Take 500 mg by mouth every 6 (six) hours as needed for mild pain or headache (or cramps).    [provider]  albuterol (VENTOLIN HFA) 108 (90 Base) MCG/ACT inhaler TAKE 2 PUFFS BY MOUTH EVERY 6 HOURS AS NEEDED FOR WHEEZE OR SHORTNESS OF BREATH Patient taking differently: Inhale 2 puffs into the lungs every 6 (six) hours as needed for wheezing or shortness of breath. 01/22/23   [name], NP  budesonide-formoterol (SYMBICORT) 80-4.5 MCG/ACT inhaler INHALE 2 PUFFS INTO THE LUNGS TWICE A DAY Patient taking differently: Inhale 2 puffs into the lungs 2 (two) times daily. 01/27/23   [name], NP  Cholecalciferol (VITAMIN D3 PO) Take 1 capsule by mouth at bedtime.  [provider]  Cyanocobalamin (VITAMIN B-12 PO) Take 1 tablet by mouth at bedtime.    [provider]  Deferasirox (JADENU) 360 MG TABS Take 3 tablets (1,080 mg total) by mouth daily. Patient taking differently: Take 1,080 mg by mouth at bedtime. 02/05/23   [name], NP  diphenhydrAMINE (BENADRYL) 25 MG tablet Take 25 mg by mouth See admin instructions. Take 25 mg by mouth every four to six hours with each dose of Hydromorphone    [provider]  fluticasone (FLONASE) 50 MCG/ACT nasal spray Place 1 spray into both nostrils daily as needed for allergies or  rhinitis. 12/31/21 03/04/23  [name], NP  folic acid (FOLVITE) 1 MG tablet Take 1 tablet (1 mg total) by mouth daily. 12/23/22   [name], NP  HYDROmorphone (DILAUDID) 4 MG tablet Take 1 tablet (4 mg total) by mouth every 4 to 6 hours as needed for pain 04/10/23   [name], NP  mirtazapine (REMERON) 45 MG tablet Take 1 tablet (45 mg total) by mouth at bedtime. 12/23/22 12/23/23  [name], NP  naloxone [facility]) nasal spray 4 mg/0.1 mL Place 0.1 sprays (0.4 mg total) into the nose once as needed (opioid overdose). 12/23/22   [name], NP  OXBRYTA 500 MG TABS tablet Take 1,500 mg by mouth daily. Patient not taking: Reported on 03/04/2023 02/05/23   [name], NP  polyethylene glycol (MIRALAX / GLYCOLAX) 17 g packet Take 17 g by mouth daily as needed for mild constipation (MIX AS DIRECTED AND DRINK). 12/23/22   [name], NP  promethazine (PHENERGAN) 25 MG tablet Take 0.5-1 tablets (12.5-25 mg total) by mouth every 6 (six) hours as needed for nausea or vomiting. 10/31/21 03/04/23  [name], NP  Vitamin D, Ergocalciferol, (DRISDOL) 1.25 MG (50000 UNIT) CAPS capsule Take 1 capsule (50,000 Units total) by mouth once a week. Patient taking differently: Take 50,000 Units by mouth every Wednesday. 12/23/22 12/23/23  [name], NP  XARELTO 20 MG TABS tablet Take 1 tablet (20 mg total) by mouth daily with supper. Patient taking differently: Take 20 mg by mouth at bedtime. 10/29/21 03/04/23  [name], NP    Physical Exam: BP 114/81   Pulse 78   Temp 98.1 F (36.7 C) (Oral)   Resp 18   Ht 5\' 3"  (1.6 m)   Wt 50.4 kg   SpO2 98%   BMI 19.68 kg/m   General: 42 y.o. year-old female well developed well nourished in no acute distress.  Alert and oriented x3. Cardiovascular: Regular rate and rhythm with no rubs or gallops.  No thyromegaly or JVD noted.  No lower extremity edema. 2/4 pulses in all 4 extremities. Respiratory: Clear to auscultation with no  wheezes or rales. Good inspiratory effort. Abdomen: Soft nontender nondistended with normal bowel sounds x4 quadrants. Muskuloskeletal: No cyanosis, clubbing or edema noted bilaterally Neuro: CN II-XII intact, strength, sensation, reflexes Skin: No ulcerative lesions noted or rashes Psychiatry: Judgement and insight appear normal. Mood is appropriate for condition and setting          Labs on Admission:  Basic Metabolic Panel: Recent Labs  Lab 04/22/23 2137  NA 135  K 3.1*  CL 102  CO2 23  GLUCOSE 98  BUN 9  CREATININE 0.51  CALCIUM 9.0   Liver Function Tests: Recent Labs  Lab 04/22/23 2137  AST 28  ALT 14  ALKPHOS 53  BILITOT 3.6*  PROT 8.5*  ALBUMIN 4.7   No results for input(s): "LIPASE", "AMYLASE" in the last 168 hours. No results for input(s): "AMMONIA" in the last 168 hours. CBC: Recent Labs  Lab 04/22/23 2137  WBC 13.7*  NEUTROABS 8.9*  HGB 8.3*  HCT 24.9*  MCV 92.6  PLT 588*   Cardiac Enzymes: No results for input(s): "CKTOTAL", "CKMB", "CKMBINDEX", "TROPONINI" in the last 168 hours.  BNP (last 3 results) No results for input(s): "BNP" in the last 8760 hours.  ProBNP (last 3 results) No results for input(s): "PROBNP" in the last 8760 hours.  CBG: No results for input(s): "GLUCAP" in the last 168 hours.  Radiological Exams on Admission: DG Chest Portable 1 View  Result Date: 04/22/2023 CLINICAL DATA:  Chest and back pain EXAM: PORTABLE CHEST 1 VIEW COMPARISON:  12/23/2022 FINDINGS: Single frontal view of the chest demonstrate stable bilateral chest wall port. Cardiac silhouette is unremarkable. No acute airspace disease, effusion, or pneumothorax. No acute bony abnormality. IMPRESSION: 1. Stable chest, no acute process. Electronically Signed   By: Sharlet Salina M.D.   On: 04/22/2023 21:21    EKG: I independently viewed the EKG done and my findings are as followed: Sinus rhythm rate of 92.  Nonspecific ST-T changes.  QTc  417.  Assessment/Plan Present on Admission:  Sickle cell pain crisis (HCC)  Principal Problem:   Sickle cell pain crisis (HCC)  Sickle cell pain crisis Admitted for pain control PCA pump Dilaudid Half-normal saline at 50 cc/h x 1 day PRN Benadryl for itching As needed IV antiemetics  Leukocytosis, suspect reactive in the setting of sickle cell pain crisis Chest x-ray non-acute No reported urinary symptoms Afebrile and nonseptic appearing Monitor fever curve and WBCs  Hypokalemia serum potassium 3.1 Repleted orally Repeat BMP and obtain magnesium level in the morning.  History of pulmonary embolism Not hypoxic with O2 saturation 100% on room air Resume home Xarelto  Asthma Stable Resume home regimen   Time: 75 minutes.   DVT prophylaxis: Home Xarelto.  Code Status: Full code  Family Communication: None at bedside.  Disposition Plan: Admitted to telemetry unit  Consults called: None.  Admission status: Inpatient status.   Status is: Inpatient The patient requires at least 2 midnights for further evaluation and treatment of present condition.   Darlin Drop MD [facility] Pager [phone]  If 7PM-7AM, please contact night-coverage [email or web address] Password [email or web address]  04/23/2023, 1:57 AM

## 2023-04-23 NOTE — Progress Notes (Signed)
[place] not verified on MAR at this time- therefore, not given. RN aware.

## 2023-04-23 NOTE — Discharge Instructions (Signed)
You were seen in the Emergency Department for sickle cell crisis We gave you 3 doses of Dilaudid which seemed to improve your pain Continue taking all previously prescribed medications Return to the emerged part for severe pain or any other concerns Otherwise please follow-up with your primary care doctor in 1 week for reevaluation

## 2023-04-23 NOTE — ED Provider Notes (Signed)
Pt still having pain, called report to hospitalist for admission Labs/imaging overall unremarkable   Zadie Rhine, MD 04/23/23 0206

## 2023-04-23 NOTE — ED Notes (Signed)
ED TO INPATIENT HANDOFF REPORT  Name/Age/Gender [facility] 42 y.o. female  Code Status    Code Status Orders  (From admission, onward)           Start     Ordered   04/23/23 0153  Full code  Continuous       Question:  By:  Answer:  Consent: discussion documented in EHR   04/23/23 0152           Code Status History     Date Active Date Inactive Code Status Order ID Comments User Context   03/04/2023 1421 03/09/2023 2155 Full Code [id number]  Massie Maroon, FNP ED   12/24/2022 1138 12/24/2022 2143 Full Code [id number]  Massie Maroon, FNP Inpatient   12/09/2022 1617 12/13/2022 1639 Full Code [id number]  Massie Maroon, FNP Inpatient   12/09/2022 1049 12/09/2022 1617 Full Code [id number]  Massie Maroon, FNP Inpatient   04/02/2022 1502 04/04/2022 1900 Full Code [id number]  Massie Maroon, FNP Inpatient   02/05/2022 0505 02/09/2022 1857 Full Code [id number]  John Giovanni, MD Inpatient   12/12/2021 0449 12/17/2021 1823 Full Code [id number]  John Giovanni, MD Inpatient   11/12/2021 0121 11/15/2021 2149 Full Code [id number]  Hillary Bow, DO ED   10/04/2021 2128 10/08/2021 2133 Full Code [id number]  Marinda Elk, MD ED   06/03/2021 0246 06/08/2021 0105 Full Code [id number]  Howerter, Justin B, DO ED   06/03/2021 0153 06/03/2021 0246 Full Code [id number]  Howerter, Justin B, DO ED   04/25/2021 0635 04/28/2021 2052 Full Code [id number]  Marinda Elk, MD ED   03/17/2021 1617 03/21/2021 1935 Full Code [id number]  Massie Maroon, FNP Inpatient   02/17/2021 2224 02/20/2021 2113 Full Code [id number]  Howerter, Justin B, DO ED   02/17/2021 2217 02/17/2021 2224 Full Code [id number]  Howerter, Justin B, DO ED   01/04/2021 0747 01/08/2021 0011 Full Code [id number]  Margie Ege A, DO ED   12/08/2020 0303 12/11/2020 2115 Full Code [id number]  Hillary Bow, DO ED   11/21/2020 1905 11/26/2020 2208 Full Code [id number]  Merlene Laughter, DO ED   11/07/2020 2108 11/13/2020 2102 Full  Code [id number]  Rometta Emery, MD ED   10/05/2020 2218 10/10/2020 2116 Full Code [id number]  Rometta Emery, MD Inpatient   08/23/2020 1205 08/23/2020 2225 Full Code [id number]  Massie Maroon, FNP Inpatient   04/24/2020 2244 04/28/2020 2359 Full Code [id number]  Darlin Drop, DO ED   04/21/2020 1042 04/21/2020 2145 Full Code [id number]  Massie Maroon, FNP Inpatient   03/31/2020 1625 04/04/2020 2311 Full Code [id number]  Massie Maroon, FNP Inpatient   03/31/2020 1048 03/31/2020 1625 Full Code [id number]  Massie Maroon, FNP Inpatient   02/29/2020 1808 03/05/2020 2311 Full Code [id number]  Massie Maroon, FNP Inpatient   01/07/2020 1339 01/12/2020 2253 Full Code [id number]  Quentin Angst, MD Inpatient   07/29/2019 0312 08/04/2019 1845 Full Code [id number]  Thalia Party, DO ED   06/27/2019 0602 07/02/2019 2229 Full Code [id number]  Liborio Nixon, MD ED   05/26/2019 1646 06/01/2019 2241 Full Code [id number]  Massie Maroon, FNP Inpatient   05/05/2019 0116 05/15/2019 2110 Full Code [id number]  John Giovanni, MD Inpatient   04/19/2019 1144 04/26/2019 2031 Full Code [id number]  Massie Maroon, FNP Inpatient       Home/SNF/Other Home  Chief Complaint Sickle cell pain crisis (HCC) [D57.00]  Level of Care/Admitting Diagnosis ED  Disposition     ED Disposition  Admit   Condition  --   Comment  Hospital Area: [facility] [[id number]]  Level of Care: Telemetry [5]  Admit to tele based on following criteria: Monitor for Ischemic changes  May admit patient to [facility] or [facility] if equivalent level of care is available:: Yes  Covid Evaluation: Asymptomatic - no recent exposure (last 10 days) testing not required  Diagnosis: Sickle cell pain crisis [facility]) [[id number]]  Admitting Physician: Darlin Drop [[id number]]  Attending Physician: Darlin Drop [[id number]]  Certification:: I certify this patient will need inpatient services for at least 2 midnights   Expected Medical Readiness: 04/25/2023          Medical History Past Medical History:  Diagnosis Date   Asthma    Eczema    History of pulmonary embolus (PE)    Sickle cell anemia (HCC)     Allergies Allergies  Allergen Reactions   Cefaclor Hives, Swelling and Rash   Hydroxyurea Palpitations and Other (See Comments)    Lowers "blood levels" and heart rate (causes HYPOtension); "it messes me up, it drops my levels and stuff" and Hypotension    Omeprazole Other (See Comments)    Causes "sharp pains in the stomach"   Ketamine Palpitations and Other (See Comments)    "Pt states she has had previous reaction to ketamine. States she becomes flushed, heart races, dizzy, and feels like she is going to pass out."    IV Location/Drains/Wounds Patient Lines/Drains/Airways Status     Active Line/Drains/Airways     Name Placement date Placement time Site Days   Implanted Port 01/06/20 01/06/20  0542  --  1203   Implanted Port 10/06/10 Right Chest 10/06/10  1900  Chest  4582            Labs/Imaging Results for orders placed or performed during the hospital encounter of 04/22/23 (from the past 48 hour(s))  Comprehensive metabolic panel     Status: Abnormal   Collection Time: 04/22/23  9:37 PM  Result Value Ref Range   Sodium 135 135 - 145 mmol/L   Potassium 3.1 (L) 3.5 - 5.1 mmol/L   Chloride 102 98 - 111 mmol/L   CO2 23 22 - 32 mmol/L   Glucose, Bld 98 70 - 99 mg/dL    Comment: Glucose reference range applies only to samples taken after fasting for at least 8 hours.   BUN 9 6 - 20 mg/dL   Creatinine, Ser 9.56 0.44 - 1.00 mg/dL   Calcium 9.0 8.9 - 21.3 mg/dL   Total Protein 8.5 (H) 6.5 - 8.1 g/dL   Albumin 4.7 3.5 - 5.0 g/dL   AST 28 15 - 41 U/L   ALT 14 0 - 44 U/L   Alkaline Phosphatase 53 38 - 126 U/L   Total Bilirubin 3.6 (H) <1.2 mg/dL   GFR, Estimated >08 >65 mL/min    Comment: (NOTE) Calculated using the CKD-EPI Creatinine Equation (2021)    Anion gap 10 5 -  15    Comment: Performed at [facility], [address] [place], [place] [address]  CBC with Differential     Status: Abnormal   Collection Time: 04/22/23  9:37 PM  Result Value Ref Range   WBC 13.7 (H) 4.0 - 10.5 K/uL   RBC 2.69 (L) 3.87 - 5.11 MIL/uL   Hemoglobin 8.3 (L) 12.0 - 15.0 g/dL   HCT 62.9 (L) 52.8 - 41.3 %  MCV 92.6 80.0 - 100.0 fL   MCH 30.9 26.0 - 34.0 pg   MCHC 33.3 30.0 - 36.0 g/dL   RDW 14.7 (H) 82.9 - 56.2 %   Platelets 588 (H) 150 - 400 K/uL   nRBC 0.1 0.0 - 0.2 %   Neutrophils Relative % 64 %   Neutro Abs 8.9 (H) 1.7 - 7.7 K/uL   Lymphocytes Relative 21 %   Lymphs Abs 2.9 0.7 - 4.0 K/uL   Monocytes Relative 13 %   Monocytes Absolute 1.8 (H) 0.1 - 1.0 K/uL   Eosinophils Relative 1 %   Eosinophils Absolute 0.1 0.0 - 0.5 K/uL   Basophils Relative 1 %   Basophils Absolute 0.1 0.0 - 0.1 K/uL   Immature Granulocytes 0 %   Abs Immature Granulocytes 0.04 0.00 - 0.07 K/uL    Comment: Performed at [facility], [address], [place], [place] [address]  Reticulocytes     Status: Abnormal   Collection Time: 04/22/23  9:37 PM  Result Value Ref Range   Retic Ct Pct 6.3 (H) 0.4 - 3.1 %   RBC. 2.62 (L) 3.87 - 5.11 MIL/uL   Retic Count, Absolute 166.1 19.0 - 186.0 K/uL   Immature Retic Fract 10.1 2.3 - 15.9 %    Comment: Performed at [facility], [address] [place], [place] [address]  hCG, serum, qualitative     Status: None   Collection Time: 04/22/23  9:37 PM  Result Value Ref Range   Preg, Serum NEGATIVE NEGATIVE    Comment:        THE SENSITIVITY OF THIS METHODOLOGY IS >10 mIU/mL. Performed at [facility], [address], [place], [place] [address]   CBC     Status: Abnormal   Collection Time: 04/23/23  6:20 AM  Result Value Ref Range   WBC 13.2 (H) 4.0 - 10.5 K/uL   RBC 2.40 (L) 3.87 - 5.11 MIL/uL   Hemoglobin 7.5 (L) 12.0 - 15.0 g/dL   HCT 28.4 (L) 13.2 - 44.0 %   MCV  94.2 80.0 - 100.0 fL   MCH 31.3 26.0 - 34.0 pg   MCHC 33.2 30.0 - 36.0 g/dL   RDW 10.2 (H) 72.5 - 36.6 %   Platelets 506 (H) 150 - 400 K/uL   nRBC 0.2 0.0 - 0.2 %    Comment: Performed at [facility], [address], [place], [place] [address]  Comprehensive metabolic panel     Status: Abnormal   Collection Time: 04/23/23  6:20 AM  Result Value Ref Range   Sodium 132 (L) 135 - 145 mmol/L   Potassium 4.1 3.5 - 5.1 mmol/L   Chloride 105 98 - 111 mmol/L   CO2 24 22 - 32 mmol/L   Glucose, Bld 92 70 - 99 mg/dL    Comment: Glucose reference range applies only to samples taken after fasting for at least 8 hours.   BUN 8 6 - 20 mg/dL   Creatinine, Ser 7.42 0.44 - 1.00 mg/dL   Calcium 8.5 (L) 8.9 - 10.3 mg/dL   Total Protein 7.9 6.5 - 8.1 g/dL   Albumin 4.1 3.5 - 5.0 g/dL   AST 29 15 - 41 U/L   ALT 13 0 - 44 U/L   Alkaline Phosphatase 48 38 - 126 U/L   Total Bilirubin 3.1 (H) <1.2 mg/dL   GFR, Estimated >59 >56 mL/min    Comment: (NOTE) Calculated using the CKD-EPI  Creatinine Equation (2021)    Anion gap 3 (L) 5 - 15    Comment: Performed at [facility], [address] [place], [place] [address]  Magnesium     Status: None   Collection Time: 04/23/23  6:20 AM  Result Value Ref Range   Magnesium 2.2 1.7 - 2.4 mg/dL    Comment: Performed at [facility], [address], [place], [place] [address]  Phosphorus     Status: None   Collection Time: 04/23/23  6:20 AM  Result Value Ref Range   Phosphorus 4.1 2.5 - 4.6 mg/dL    Comment: Performed at [facility], [address], [place], [place] [address]   DG Chest Portable 1 View  Result Date: 04/22/2023 CLINICAL DATA:  Chest and back pain EXAM: PORTABLE CHEST 1 VIEW COMPARISON:  12/23/2022 FINDINGS: Single frontal view of the chest demonstrate stable bilateral chest wall port. Cardiac silhouette is unremarkable. No acute airspace disease, effusion, or  pneumothorax. No acute bony abnormality. IMPRESSION: 1. Stable chest, no acute process. Electronically Signed   By: Sharlet Salina M.D.   On: 04/22/2023 21:21    Pending Labs Unresulted Labs (From admission, onward)    None       Vitals/Pain Today's Vitals   04/23/23 0300 04/23/23 0521 04/23/23 0530 04/23/23 0711  BP: (!) 118/90  103/69 97/72  Pulse: 89  72 86  Resp: 17  15 16   Temp: 97.7 F (36.5 C)  97.7 F (36.5 C)   TempSrc: Oral  Oral   SpO2: 100%  99% 100%  Weight:      Height:      PainSc:  8       Isolation Precautions No active isolations  Medications Medications  acetaminophen (TYLENOL) tablet 650 mg (has no administration in time range)  prochlorperazine (COMPAZINE) injection 5 mg (has no administration in time range)  HYDROmorphone (DILAUDID) injection 1 mg (1 mg Intravenous Given 04/23/23 0521)  polyethylene glycol (MIRALAX / GLYCOLAX) packet 17 g (has no administration in time range)  melatonin tablet 5 mg (has no administration in time range)  potassium chloride SA (KLOR-CON M) CR tablet 20 mEq (20 mEq Oral Given 04/23/23 0259)  rivaroxaban (XARELTO) tablet 20 mg (20 mg Oral Given 04/23/23 0301)  folic acid (FOLVITE) tablet 1 mg (has no administration in time range)  mometasone-formoterol (DULERA) 100-5 MCG/ACT inhaler 2 puff (has no administration in time range)  0.45 % sodium chloride infusion ( Intravenous New Bag/Given 04/23/23 0306)  albuterol (PROVENTIL) (2.5 MG/3ML) 0.083% nebulizer solution 2.5 mg (has no administration in time range)  diphenhydrAMINE (BENADRYL) injection 12.5 mg (12.5 mg Intravenous Given 04/23/23 0300)  naloxone (NARCAN) injection 0.4 mg (has no administration in time range)    And  sodium chloride flush (NS) 0.9 % injection 9 mL (has no administration in time range)  HYDROmorphone (DILAUDID) 1 mg/mL PCA injection (0 mg Intravenous Hold 04/23/23 0319)  mirtazapine (REMERON) tablet 45 mg (has no administration in time range)   diphenhydrAMINE (BENADRYL) injection 25 mg (25 mg Intravenous Given 04/22/23 2341)  HYDROmorphone (DILAUDID) injection 2 mg (2 mg Intravenous Given 04/22/23 2342)  HYDROmorphone (DILAUDID) injection 2 mg (2 mg Intravenous Given 04/23/23 0037)  potassium chloride (KLOR-CON) packet 40 mEq (40 mEq Oral Given 04/23/23 0039)    Mobility walks

## 2023-04-24 ENCOUNTER — Other Ambulatory Visit (HOSPITAL_COMMUNITY): Payer: Self-pay

## 2023-04-24 ENCOUNTER — Other Ambulatory Visit: Payer: Self-pay

## 2023-04-24 DIAGNOSIS — D5700 Hb-SS disease with crisis, unspecified: Secondary | ICD-10-CM | POA: Diagnosis not present

## 2023-04-24 LAB — URINALYSIS, ROUTINE W REFLEX MICROSCOPIC
Bilirubin Urine: NEGATIVE
Glucose, UA: NEGATIVE mg/dL
Ketones, ur: NEGATIVE mg/dL
Nitrite: NEGATIVE
Protein, ur: NEGATIVE mg/dL
Specific Gravity, Urine: 1.005 (ref 1.005–1.030)
pH: 5 (ref 5.0–8.0)

## 2023-04-24 MED ORDER — HYDROMORPHONE HCL 4 MG PO TABS
4.0000 mg | ORAL_TABLET | ORAL | 0 refills | Status: DC
  Filled 2023-04-24: qty 60, 10d supply, fill #0

## 2023-04-24 MED ORDER — RIVAROXABAN 20 MG PO TABS
20.0000 mg | ORAL_TABLET | Freq: Every day | ORAL | Status: DC
  Administered 2023-04-24 – 2023-04-27 (×4): 20 mg via ORAL
  Filled 2023-04-24 (×4): qty 1

## 2023-04-24 MED ORDER — SODIUM CHLORIDE 0.45 % IV SOLN: INTRAVENOUS | Status: DC

## 2023-04-24 NOTE — Plan of Care (Signed)
  Problem: Tissue Perfusion: Goal: Complications related to inadequate tissue perfusion will be avoided or minimized Outcome: Progressing   Problem: Respiratory: Goal: Pulmonary complications will be avoided or minimized Outcome: Progressing   Problem: Pain Management: Goal: General experience of comfort will improve Outcome: Progressing

## 2023-04-24 NOTE — Progress Notes (Signed)
Subjective: Molly Gutierrez is a 42 year old female with a medical history significant for sickle cell disease, chronic pain syndrome, opiate dependence and tolerance, history of PE on Xarelto, history of mild intermittent asthma, and anemia of chronic disease that was admitted for sickle cell pain crisis. Patient states that pain intensity has decreased some overnight.  She rates her pain as 7/10.  Patient denies any headache, chest pain, shortness of breath, urinary symptoms, nausea, vomiting, or diarrhea.  Objective:  Vital signs in last 24 hours:  Vitals:   04/24/23 0838 04/24/23 0841 04/24/23 1037 04/24/23 1158  BP:   112/88   Pulse:   (!) 101   Resp: 14  18 18   Temp:   99 F (37.2 C)   TempSrc:   Oral   SpO2: 97% 99% 98% 98%  Weight:      Height:        Intake/Output from previous day:   Intake/Output Summary (Last 24 hours) at 04/24/2023 1221 Last data filed at 04/24/2023 0501 Gross per 24 hour  Intake 1309.65 ml  Output 300 ml  Net 1009.65 ml    Physical Exam: General: Alert, awake, oriented x3, in no acute distress.  HEENT: [place]/AT PEERL, EOMI Neck: Trachea midline,  no masses, no thyromegal,y no JVD, no carotid bruit OROPHARYNX:  Moist, No exudate/ erythema/lesions.  Heart: Regular rate and rhythm, without murmurs, rubs, gallops, PMI non-displaced, no heaves or thrills on palpation.  Lungs: Clear to auscultation, no wheezing or rhonchi noted. No increased vocal fremitus resonant to percussion  Abdomen: Soft, nontender, nondistended, positive bowel sounds, no masses no hepatosplenomegaly noted..  Neuro: No focal neurological deficits noted cranial nerves II through XII grossly intact. DTRs 2+ bilaterally upper and lower extremities. Strength 5 out of 5 in bilateral upper and lower extremities. Musculoskeletal: No warm swelling or erythema around joints, no spinal tenderness noted. Psychiatric: Patient alert and oriented x3, good insight and cognition, good recent  to remote recall. Lymph node survey: No cervical axillary or inguinal lymphadenopathy noted.  Lab Results:  Basic Metabolic Panel:    Component Value Date/Time   NA 132 (L) 04/23/2023 0620   NA 137 12/23/2022 1210   K 4.1 04/23/2023 0620   CL 105 04/23/2023 0620   CO2 24 04/23/2023 0620   BUN 8 04/23/2023 0620   BUN 5 (L) 12/23/2022 1210   CREATININE 0.59 04/23/2023 0620   GLUCOSE 92 04/23/2023 0620   CALCIUM 8.5 (L) 04/23/2023 0620   CBC:    Component Value Date/Time   WBC 13.2 (H) 04/23/2023 0620   HGB 7.5 (L) 04/23/2023 0620   HGB 7.9 (L) 12/23/2022 1210   HCT 22.6 (L) 04/23/2023 0620   HCT 24.4 (L) 12/23/2022 1210   PLT 506 (H) 04/23/2023 0620   PLT 554 (H) 12/23/2022 1210   MCV 94.2 04/23/2023 0620   MCV 94 12/23/2022 1210   NEUTROABS 8.9 (H) 04/22/2023 2137   NEUTROABS 10.4 (H) 12/23/2022 1210   LYMPHSABS 2.9 04/22/2023 2137   LYMPHSABS 2.9 12/23/2022 1210   MONOABS 1.8 (H) 04/22/2023 2137   EOSABS 0.1 04/22/2023 2137   EOSABS 0.2 12/23/2022 1210   BASOSABS 0.1 04/22/2023 2137   BASOSABS 0.1 12/23/2022 1210    No results found for this or any previous visit (from the past 240 hour(s)).  Studies/Results: DG Chest Portable 1 View  Result Date: 04/22/2023 CLINICAL DATA:  Chest and back pain EXAM: PORTABLE CHEST 1 VIEW COMPARISON:  12/23/2022 FINDINGS: Single frontal view of the chest demonstrate  stable bilateral chest wall port. Cardiac silhouette is unremarkable. No acute airspace disease, effusion, or pneumothorax. No acute bony abnormality. IMPRESSION: 1. Stable chest, no acute process. Electronically Signed   By: Sharlet Salina M.D.   On: 04/22/2023 21:21    Medications: Scheduled Meds:  Chlorhexidine Gluconate Cloth  6 each Topical Daily   folic acid  1 mg Oral Daily   HYDROmorphone   Intravenous Q4H   mirtazapine  45 mg Oral QHS   mometasone-formoterol  2 puff Inhalation BID   rivaroxaban  20 mg Oral Q supper   Continuous Infusions: PRN  Meds:.acetaminophen, albuterol, diphenhydrAMINE, melatonin, naloxone **AND** sodium chloride flush, polyethylene glycol, prochlorperazine  Consultants: none  Procedures: none  Antibiotics: none  Assessment/Plan: Principal Problem:   Sickle cell pain crisis (HCC)  Sickle cell disease with pain crisis: Continue IV Dilaudid PCA, no changes today Toradol 15 mg IV every 6 hours Decrease IV fluids to [facility] Monitor vital signs very closely, reevaluate pain scale regularly, and supplemental oxygen as needed  Chronic pain syndrome: Will restart oral Dilaudid as pain intensity improves  Anemia of chronic disease: Hemoglobin remained stable and consistent with patient's baseline.  She is typically not transfused unless hemoglobin is less than 7.0 g/dL.  Monitor labs in AM.  History of PE: Continue Xarelto  Mild intermittent asthma: Stable.  Home medications as needed.    Code Status: Full Code Family Communication: N/A Disposition Plan: Not yet ready for discharge  Love Chowning [name]  APRN, MSN, FNP-C [facility] [address] [place], [place] [address] [phone]  If 7PM-7AM, please contact night-coverage.  04/24/2023, 12:21 PM  LOS: 1 day

## 2023-04-25 DIAGNOSIS — D5700 Hb-SS disease with crisis, unspecified: Secondary | ICD-10-CM | POA: Diagnosis not present

## 2023-04-25 MED ORDER — HYDROMORPHONE HCL 4 MG PO TABS
4.0000 mg | ORAL_TABLET | Freq: Four times a day (QID) | ORAL | Status: DC | PRN
  Administered 2023-04-25 – 2023-04-27 (×2): 4 mg via ORAL
  Filled 2023-04-25 (×2): qty 1

## 2023-04-25 MED ORDER — HYDROMORPHONE 1 MG/ML IV SOLN
INTRAVENOUS | Status: DC
  Administered 2023-04-25: 1.5 mg via INTRAVENOUS
  Administered 2023-04-25: 8 mg via INTRAVENOUS
  Administered 2023-04-25: 30 mg via INTRAVENOUS
  Administered 2023-04-26: 6.5 mg via INTRAVENOUS
  Administered 2023-04-26: 30 mg via INTRAVENOUS
  Administered 2023-04-26: 5 mg via INTRAVENOUS
  Administered 2023-04-27: 6 mg via INTRAVENOUS
  Filled 2023-04-25 (×2): qty 30

## 2023-04-25 NOTE — Plan of Care (Signed)
  Problem: Education: Goal: Knowledge of vaso-occlusive preventative measures will improve Outcome: Progressing   Problem: Self-Care: Goal: Ability to incorporate actions that prevent/reduce pain crisis will improve Outcome: Progressing   Problem: Respiratory: Goal: Pulmonary complications will be avoided or minimized Outcome: Progressing   Problem: Clinical Measurements: Goal: Ability to maintain clinical measurements within normal limits will improve Outcome: Progressing   Problem: Pain Management: Goal: General experience of comfort will improve Outcome: Progressing

## 2023-04-25 NOTE — Progress Notes (Signed)
Subjective: Molly Gutierrez is a 42 year old female with a medical history significant for sickle cell disease, chronic pain syndrome, opiate dependence and tolerance, history of PE on Xarelto, history of mild intermittent asthma, and anemia of chronic disease that was admitted for sickle cell pain crisis. Patient has no new complaints on today.   She rates her pain as 7/10.  Patient denies any headache, chest pain, shortness of breath, urinary symptoms, nausea, vomiting, or diarrhea.  Objective:  Vital signs in last 24 hours:  Vitals:   04/25/23 0639 04/25/23 0854 04/25/23 0936 04/25/23 1104  BP: 109/81  105/68   Pulse: (!) 109  95   Resp: 14 14 14 15   Temp: 99.1 F (37.3 C)  98.8 F (37.1 C)   TempSrc: Oral  Oral   SpO2: 98% 96% 97% 96%  Weight:      Height:        Intake/Output from previous day:  No intake or output data in the 24 hours ending 04/25/23 1334   Physical Exam: General: Alert, awake, oriented x3, in no acute distress.  HEENT: [place]/AT PEERL, EOMI Neck: Trachea midline,  no masses, no thyromegal,y no JVD, no carotid bruit OROPHARYNX:  Moist, No exudate/ erythema/lesions.  Heart: Regular rate and rhythm, without murmurs, rubs, gallops, PMI non-displaced, no heaves or thrills on palpation.  Lungs: Clear to auscultation, no wheezing or rhonchi noted. No increased vocal fremitus resonant to percussion  Abdomen: Soft, nontender, nondistended, positive bowel sounds, no masses no hepatosplenomegaly noted..  Neuro: No focal neurological deficits noted cranial nerves II through XII grossly intact. DTRs 2+ bilaterally upper and lower extremities. Strength 5 out of 5 in bilateral upper and lower extremities. Musculoskeletal: No warm swelling or erythema around joints, no spinal tenderness noted. Psychiatric: Patient alert and oriented x3, good insight and cognition, good recent to remote recall. Lymph node survey: No cervical axillary or inguinal lymphadenopathy  noted.  Lab Results:  Basic Metabolic Panel:    Component Value Date/Time   NA 132 (L) 04/23/2023 0620   NA 137 12/23/2022 1210   K 4.1 04/23/2023 0620   CL 105 04/23/2023 0620   CO2 24 04/23/2023 0620   BUN 8 04/23/2023 0620   BUN 5 (L) 12/23/2022 1210   CREATININE 0.59 04/23/2023 0620   GLUCOSE 92 04/23/2023 0620   CALCIUM 8.5 (L) 04/23/2023 0620   CBC:    Component Value Date/Time   WBC 13.2 (H) 04/23/2023 0620   HGB 7.5 (L) 04/23/2023 0620   HGB 7.9 (L) 12/23/2022 1210   HCT 22.6 (L) 04/23/2023 0620   HCT 24.4 (L) 12/23/2022 1210   PLT 506 (H) 04/23/2023 0620   PLT 554 (H) 12/23/2022 1210   MCV 94.2 04/23/2023 0620   MCV 94 12/23/2022 1210   NEUTROABS 8.9 (H) 04/22/2023 2137   NEUTROABS 10.4 (H) 12/23/2022 1210   LYMPHSABS 2.9 04/22/2023 2137   LYMPHSABS 2.9 12/23/2022 1210   MONOABS 1.8 (H) 04/22/2023 2137   EOSABS 0.1 04/22/2023 2137   EOSABS 0.2 12/23/2022 1210   BASOSABS 0.1 04/22/2023 2137   BASOSABS 0.1 12/23/2022 1210    No results found for this or any previous visit (from the past 240 hour(s)).  Studies/Results: No results found.  Medications: Scheduled Meds:  Chlorhexidine Gluconate Cloth  6 each Topical Daily   folic acid  1 mg Oral Daily   HYDROmorphone   Intravenous Q4H   mirtazapine  45 mg Oral QHS   mometasone-formoterol  2 puff Inhalation BID  rivaroxaban  20 mg Oral Q supper   Continuous Infusions: PRN Meds:.acetaminophen, albuterol, diphenhydrAMINE, melatonin, naloxone **AND** sodium chloride flush, polyethylene glycol, prochlorperazine  Consultants: none  Procedures: none  Antibiotics: none  Assessment/Plan: Principal Problem:   Sickle cell pain crisis (HCC)  Sickle cell disease with pain crisis: Weaning IV dilaudid PCA Toradol 15 mg IV every 6 hours Decrease IV fluids to [facility] Monitor vital signs very closely, reevaluate pain scale regularly, and supplemental oxygen as needed  Chronic pain syndrome: Will restart oral  Dilaudid as pain intensity improves  Anemia of chronic disease: Hemoglobin remained stable and consistent with patient's baseline.  She is typically not transfused unless hemoglobin is less than 7.0 g/dL.  Monitor labs in AM.  History of PE: Continue Xarelto  Mild intermittent asthma: Stable.  Home medications as needed.    Code Status: Full Code Family Communication: N/A Disposition Plan: Not yet ready for discharge  Kayler Buckholtz [name]  APRN, MSN, FNP-C [facility] [address] [place], [place] [address] [phone]  If 7PM-7AM, please contact night-coverage.  04/25/2023, 1:34 PM  LOS: 2 days

## 2023-04-26 DIAGNOSIS — D5700 Hb-SS disease with crisis, unspecified: Secondary | ICD-10-CM | POA: Diagnosis not present

## 2023-04-26 LAB — CBC
HCT: 22.2 % — ABNORMAL LOW (ref 36.0–46.0)
Hemoglobin: 7.6 g/dL — ABNORMAL LOW (ref 12.0–15.0)
MCH: 32.1 pg (ref 26.0–34.0)
MCHC: 34.2 g/dL (ref 30.0–36.0)
MCV: 93.7 fL (ref 80.0–100.0)
Platelets: 477 10*3/uL — ABNORMAL HIGH (ref 150–400)
RBC: 2.37 MIL/uL — ABNORMAL LOW (ref 3.87–5.11)
RDW: 19.2 % — ABNORMAL HIGH (ref 11.5–15.5)
WBC: 19.5 10*3/uL — ABNORMAL HIGH (ref 4.0–10.5)
nRBC: 0.3 % — ABNORMAL HIGH (ref 0.0–0.2)

## 2023-04-26 NOTE — Progress Notes (Signed)
Patient ID: Molly Gutierrez, female   DOB: 23-Jun-1980, 42 y.o.   MRN: [id number] Subjective: Molly Gutierrez is a 42 year old female with a medical history significant for sickle cell disease, chronic pain syndrome, opiate dependence and tolerance, history of PE on Xarelto, history of mild intermittent asthma, and anemia of chronic disease that was admitted for sickle cell pain crisis.   Patient has no new complaint this morning.  She is still having significant pain, currently at about 7/10 which has not changed from yesterday.  She denies any fever, cough, chest pain, shortness of breath, nausea, vomiting or diarrhea.  No urinary symptoms.  Objective:  Vital signs in last 24 hours:  Vitals:   04/26/23 0413 04/26/23 0910 04/26/23 0943 04/26/23 1342  BP: 105/74 108/70  121/85  Pulse: 95 88    Resp: 10 18  18   Temp: 99.4 F (37.4 C) 98.5 F (36.9 C)  98.8 F (37.1 C)  TempSrc: Oral Oral  Oral  SpO2: 98% 95% 97% 94%  Weight:      Height:        Intake/Output from previous day:   Intake/Output Summary (Last 24 hours) at 04/26/2023 1440 Last data filed at 04/26/2023 0900 Gross per 24 hour  Intake 120 ml  Output --  Net 120 ml    Physical Exam: General: Alert, awake, oriented x3, in no acute distress.  Resting comfortably. HEENT: Molly Gutierrez, EOMI Neck: Trachea midline,  no masses, no thyromegal,y no JVD, no carotid bruit OROPHARYNX:  Moist, No exudate/ erythema/lesions.  Heart: Regular rate and rhythm, without murmurs, rubs, gallops, PMI non-displaced, no heaves or thrills on palpation.  Lungs: Clear to auscultation, no wheezing or rhonchi noted. No increased vocal fremitus resonant to percussion  Abdomen: Soft, nontender, nondistended, positive bowel sounds, no masses no hepatosplenomegaly noted..  Neuro: No focal neurological deficits noted cranial nerves II through XII grossly intact. DTRs 2+ bilaterally upper and lower extremities. Strength 5 out of 5 in bilateral  upper and lower extremities. Musculoskeletal: No warm swelling or erythema around joints, no spinal tenderness noted. Psychiatric: Patient alert and oriented x3, good insight and cognition, good recent to remote recall. Lymph node survey: No cervical axillary or inguinal lymphadenopathy noted.  Lab Results:  Basic Metabolic Panel:    Component Value Date/Time   NA 132 (L) 04/23/2023 0620   NA 137 12/23/2022 1210   K 4.1 04/23/2023 0620   CL 105 04/23/2023 0620   CO2 24 04/23/2023 0620   BUN 8 04/23/2023 0620   BUN 5 (L) 12/23/2022 1210   CREATININE 0.59 04/23/2023 0620   GLUCOSE 92 04/23/2023 0620   CALCIUM 8.5 (L) 04/23/2023 0620   CBC:    Component Value Date/Time   WBC 19.5 (H) 04/26/2023 0630   HGB 7.6 (L) 04/26/2023 0630   HGB 7.9 (L) 12/23/2022 1210   HCT 22.2 (L) 04/26/2023 0630   HCT 24.4 (L) 12/23/2022 1210   PLT 477 (H) 04/26/2023 0630   PLT 554 (H) 12/23/2022 1210   MCV 93.7 04/26/2023 0630   MCV 94 12/23/2022 1210   NEUTROABS 8.9 (H) 04/22/2023 2137   NEUTROABS 10.4 (H) 12/23/2022 1210   LYMPHSABS 2.9 04/22/2023 2137   LYMPHSABS 2.9 12/23/2022 1210   MONOABS 1.8 (H) 04/22/2023 2137   EOSABS 0.1 04/22/2023 2137   EOSABS 0.2 12/23/2022 1210   BASOSABS 0.1 04/22/2023 2137   BASOSABS 0.1 12/23/2022 1210    No results found for this or any previous visit (from the past 240 hour(s)).  Studies/Results: No results found.  Medications: Scheduled Meds:  Chlorhexidine Gluconate Cloth  6 each Topical Daily   folic acid  1 mg Oral Daily   HYDROmorphone   Intravenous Q4H   mirtazapine  45 mg Oral QHS   mometasone-formoterol  2 puff Inhalation BID   rivaroxaban  20 mg Oral Q supper   Continuous Infusions: PRN Meds:.acetaminophen, albuterol, diphenhydrAMINE, HYDROmorphone, melatonin, naloxone **AND** sodium chloride flush, polyethylene glycol, prochlorperazine  Consultants: None  Procedures: None  Antibiotics: None  Assessment/Plan: Principal  Problem:   Sickle cell pain crisis (HCC)  Hb Sickle Cell Disease with Pain crisis: Continue IVF at Molly Gutierrez.  Continue to wean weight based Dilaudid PCA, continue IV Toradol 15 mg Q 6 H, continue oral home pain medications as ordered.  Monitor vitals very closely, Re-evaluate pain scale regularly, 2 L of Oxygen by Molly Gutierrez. Leukocytosis: WBC count currently at about 19, up from 13 yesterday.  Most likely due to demargination from sickle cell crisis.  There is no other indication for any infection or inflammation at this time.  Will continue to monitor closely without antibiotics.  Recheck lab in AM. Anemia of Chronic Disease: Hemoglobin is currently stable at baseline.  There is no clinical indication for blood transfusion at this time.  Will continue to monitor closely and transfuse as appropriate. Chronic pain Syndrome: Will restart oral home pain medications once PCA is completely weaned off. History of PE: Continue Xarelto.  Code Status: Full Code Family Communication: N/A Disposition Plan: Not yet ready for discharge  Molly Gutierrez  If 7PM-7AM, please contact night-coverage.  04/26/2023, 2:40 PM  LOS: 3 days

## 2023-04-26 NOTE — Plan of Care (Signed)
  Problem: Education: Goal: Knowledge of vaso-occlusive preventative measures will improve Outcome: Progressing   Problem: Self-Care: Goal: Ability to incorporate actions that prevent/reduce pain crisis will improve Outcome: Progressing   Problem: Bowel/Gastric: Goal: Gut motility will be maintained Outcome: Progressing   Problem: Respiratory: Goal: Pulmonary complications will be avoided or minimized Outcome: Progressing   Problem: Nutrition: Goal: Adequate nutrition will be maintained Outcome: Progressing   Problem: Pain Management: Goal: General experience of comfort will improve Outcome: Progressing   Problem: Safety: Goal: Ability to remain free from injury will improve Outcome: Progressing

## 2023-04-27 DIAGNOSIS — D5700 Hb-SS disease with crisis, unspecified: Secondary | ICD-10-CM | POA: Diagnosis not present

## 2023-04-27 LAB — URINE CULTURE

## 2023-04-27 LAB — CBC
HCT: 24.3 % — ABNORMAL LOW (ref 36.0–46.0)
Hemoglobin: 8.1 g/dL — ABNORMAL LOW (ref 12.0–15.0)
MCH: 32.1 pg (ref 26.0–34.0)
MCHC: 33.3 g/dL (ref 30.0–36.0)
MCV: 96.4 fL (ref 80.0–100.0)
Platelets: 505 10*3/uL — ABNORMAL HIGH (ref 150–400)
RBC: 2.52 MIL/uL — ABNORMAL LOW (ref 3.87–5.11)
RDW: 19.6 % — ABNORMAL HIGH (ref 11.5–15.5)
WBC: 20.6 10*3/uL — ABNORMAL HIGH (ref 4.0–10.5)
nRBC: 0.4 % — ABNORMAL HIGH (ref 0.0–0.2)

## 2023-04-27 MED ORDER — HEPARIN SOD (PORK) LOCK FLUSH 100 UNIT/ML IV SOLN
500.0000 [IU] | Freq: Once | INTRAVENOUS | Status: AC
  Administered 2023-04-27: 500 [IU] via INTRAVENOUS
  Filled 2023-04-27: qty 5

## 2023-04-27 NOTE — Plan of Care (Signed)
PT D/C to home, AVS reviewed, port de- accessed, belongings returned. AVS questions answered. No other needs voiced at this time. Pt escorted off the unit by staff. BP 104/74 (BP Location: Right Arm)   Pulse 87   Temp 98.5 F (36.9 C) (Oral)   Resp 14   Ht 5\' 3"  (1.6 m)   Wt 50.4 kg   SpO2 98%   BMI 19.68 kg/m  Reva Bores 04/27/23 3:27 PM

## 2023-04-27 NOTE — Progress Notes (Signed)
PT eating and does not wish to take scheduled Dulera at this time.

## 2023-04-27 NOTE — Discharge Summary (Signed)
Physician Discharge Summary  [facility] VWU:[id number] DOB: 1981-03-29 DOA: 04/22/2023  PCP: [name], NP  Admit date: 04/22/2023  Discharge date: 04/27/2023  Discharge Diagnoses:  Principal Problem:   Sickle cell pain crisis [facility])   Discharge Condition: Stable  Disposition:   Follow-up Information     [name], NP. Schedule an appointment as soon as possible for a visit in 1 week.   Specialties: Pulmonary Disease, Endocrinology Contact information: [address] [place] [address] [phone]                Pt is discharged home in good condition and is to follow up with [name], NP this week to have labs evaluated. Molly Gutierrez is instructed to increase activity slowly and balance with rest for the next few days, and use prescribed medication to complete treatment of pain  Diet: Regular Wt Readings from Last 3 Encounters:  04/22/23 50.4 kg  03/04/23 50.4 kg  12/23/22 50.4 kg    History of present illness:  Molly Gutierrez is a 42 y.o. female with medical history significant for pulmonary embolism on Xarelto, asthma, sickle cell anemia, and recurrent hospitalization for sickle cell pain crisis, who presents to the ED with complaints of pain in her chest, shoulders, and back not improved with home oral narcotics.  The pain is similar to previous sickle cell pain crisis.   In the ED, chest x-ray is non-acute.  No evidence of acute ischemia on 12-lead EKG.  The patient received several rounds of IV opioid-based narcotics with mild improvement.  Still reporting uncontrolled pain.  TRH, hospitalist service, was asked to admit for pain control.   ED Course: Temperature 97.7.  BP 95/76, pulse rate 68, respiratory rate 17, O2 saturation 100% on room air.  Lab studies notable for serum potassium 3.1, T bilirubin 3.6.  WBC 13.7, hemoglobin 8.3, platelet count 588.  Hospital Course:  Patient was admitted for sickle  cell pain crisis and managed appropriately with IVF, IV Dilaudid via PCA and IV Toradol, as well as other adjunct therapies per sickle cell pain management protocols.  Patient responded well to this regimen.  Her pain slowly returned to baseline.  She was successfully weaned off of PCA and continued on her home pain medication.  As such today, pain is at baseline, patient is ambulating well with no significant pain and tolerating p.o. intake with no restrictions.  Patient feels well enough to go home.  Patient was therefore discharged home today in a hemodynamically stable condition.   Molly Gutierrez will follow-up with PCP within 1 week of this discharge. Molly Gutierrez was counseled extensively about nonpharmacologic means of pain management, patient verbalized understanding and was appreciative of  the care received during this admission.   We discussed the need for good hydration, monitoring of hydration status, avoidance of heat, cold, stress, and infection triggers. We discussed the need to be adherent with taking Hydrea and other home medications. Patient was reminded of the need to seek medical attention immediately if any symptom of bleeding, anemia, or infection occurs.  Discharge Exam: Vitals:   04/27/23 0919 04/27/23 1014  BP:  107/76  Pulse:  93  Resp: 14 16  Temp:  98.8 F (37.1 C)  SpO2: 98% 95%   Vitals:   04/27/23 0355 04/27/23 0542 04/27/23 0919 04/27/23 1014  BP:  102/73  107/76  Pulse:  79  93  Resp: 11 14 14 16   Temp:  98.5 F (36.9 C)  98.8 F (37.1 C)  TempSrc:  Oral  Oral  SpO2: 95% 96% 98% 95%  Weight:      Height:        General appearance : Awake, alert, not in any distress. Speech Clear. Not toxic looking HEENT: Atraumatic and Normocephalic, pupils equally reactive to light and accomodation Neck: Supple, no JVD. No cervical lymphadenopathy.  Chest: Good air entry bilaterally, no added sounds  CVS: S1 S2 regular, no murmurs.  Abdomen: Bowel sounds present, Non  tender and not distended with no gaurding, rigidity or rebound. Extremities: B/L Lower Ext shows no edema, both legs are warm to touch Neurology: Awake alert, and oriented X 3, CN II-XII intact, Non focal Skin: No Rash  Discharge Instructions  Discharge Instructions     Diet - low sodium heart healthy   Complete by: As directed    Increase activity slowly   Complete by: As directed       Allergies as of 04/27/2023       Reactions   Cefaclor Hives, Swelling, Rash   Hydroxyurea Palpitations, Other (See Comments)   Lowers "blood levels" and heart rate (causes HYPOtension); "it messes me up, it drops my levels and stuff" and Hypotension   Omeprazole Other (See Comments)   Causes "sharp pains in the stomach"   Ketamine Palpitations, Other (See Comments)   "Pt states she has had previous reaction to ketamine. States she becomes flushed, heart races, dizzy, and feels like she is going to pass out."        Medication List     TAKE these medications    acetaminophen 500 MG tablet Commonly known as: TYLENOL Take 500 mg by mouth every 6 (six) hours as needed for mild pain or headache (or cramps).   albuterol 108 (90 Base) MCG/ACT inhaler Commonly known as: VENTOLIN HFA TAKE 2 PUFFS BY MOUTH EVERY 6 HOURS AS NEEDED FOR WHEEZE OR SHORTNESS OF BREATH What changed: See the new instructions.   Deferasirox 360 MG Tabs Commonly known as: Jadenu Take 3 tablets (1,080 mg total) by mouth daily. What changed: when to take this   diphenhydrAMINE 25 MG tablet Commonly known as: BENADRYL Take 25 mg by mouth See admin instructions. Take 25 mg by mouth every four to six hours with each dose of Hydromorphone   fluticasone 50 MCG/ACT nasal spray Commonly known as: FLONASE Place 1 spray into both nostrils daily as needed for allergies or rhinitis.   folic acid 1 MG tablet Commonly known as: FOLVITE Take 1 tablet (1 mg total) by mouth daily.   HYDROmorphone 4 MG tablet Commonly known  as: DILAUDID Take 1 tablet (4 mg total) by mouth every 4 to 6 hours as needed for pain   mirtazapine 45 MG tablet Commonly known as: REMERON Take 1 tablet (45 mg total) by mouth at bedtime.   naloxone 4 MG/0.1ML Liqd nasal spray kit Commonly known as: NARCAN Place 0.1 sprays (0.4 mg total) into the nose once as needed (opioid overdose).   Oxbryta 500 MG Tabs tablet Generic drug: voxelotor Take 1,500 mg by mouth daily.   polyethylene glycol 17 g packet Commonly known as: MIRALAX / GLYCOLAX Take 17 g by mouth daily as needed for mild constipation (MIX AS DIRECTED AND DRINK).   promethazine 25 MG tablet Commonly known as: PHENERGAN Take 0.5-1 tablets (12.5-25 mg total) by mouth every 6 (six) hours as needed for nausea or vomiting.   Symbicort 80-4.5 MCG/ACT inhaler Generic drug: budesonide-formoterol INHALE 2 PUFFS  INTO THE LUNGS TWICE A DAY What changed: See the new instructions.   VITAMIN B-12 PO Take 1 tablet by mouth at bedtime.   Vitamin D (Ergocalciferol) 1.25 MG (50000 UNIT) Caps capsule Commonly known as: DRISDOL Take 1 capsule (50,000 Units total) by mouth once a week. What changed: when to take this   VITAMIN D3 PO Take 1 capsule by mouth at bedtime.   Xarelto 20 MG Tabs tablet Generic drug: rivaroxaban Take 1 tablet (20 mg total) by mouth daily with supper. What changed: when to take this        The results of significant diagnostics from this hospitalization (including imaging, microbiology, ancillary and laboratory) are listed below for reference.    Significant Diagnostic Studies: DG Chest Portable 1 View  Result Date: 04/22/2023 CLINICAL DATA:  Chest and back pain EXAM: PORTABLE CHEST 1 VIEW COMPARISON:  12/23/2022 FINDINGS: Single frontal view of the chest demonstrate stable bilateral chest wall port. Cardiac silhouette is unremarkable. No acute airspace disease, effusion, or pneumothorax. No acute bony abnormality. IMPRESSION: 1. Stable chest, no  acute process. Electronically Signed   By: Sharlet Salina M.D.   On: 04/22/2023 21:21    Microbiology: Recent Results (from the past 240 hour(s))  Urine Culture (for pregnant, neutropenic or urologic patients or patients with an indwelling urinary catheter)     Status: Abnormal   Collection Time: 04/25/23  7:42 PM   Specimen: Urine, Clean Catch  Result Value Ref Range Status   Specimen Description   Final    URINE, CLEAN CATCH Performed at [facility], [address], [place], [place] [address]    Special Requests   Final    NONE Performed at [facility], [address] [place], [place] [address]    Culture MULTIPLE SPECIES PRESENT, SUGGEST RECOLLECTION (A)  Final   Report Status 04/27/2023 FINAL  Final     Labs: Basic Metabolic Panel: Recent Labs  Lab 04/22/23 2137 04/23/23 0620  NA 135 132*  K 3.1* 4.1  CL 102 105  CO2 23 24  GLUCOSE 98 92  BUN 9 8  CREATININE 0.51 0.59  CALCIUM 9.0 8.5*  MG  --  2.2  PHOS  --  4.1   Liver Function Tests: Recent Labs  Lab 04/22/23 2137 04/23/23 0620  AST 28 29  ALT 14 13  ALKPHOS 53 48  BILITOT 3.6* 3.1*  PROT 8.5* 7.9  ALBUMIN 4.7 4.1   No results for input(s): "LIPASE", "AMYLASE" in the last 168 hours. No results for input(s): "AMMONIA" in the last 168 hours. CBC: Recent Labs  Lab 04/22/23 2137 04/23/23 0620 04/26/23 0630 04/27/23 0632  WBC 13.7* 13.2* 19.5* 20.6*  NEUTROABS 8.9*  --   --   --   HGB 8.3* 7.5* 7.6* 8.1*  HCT 24.9* 22.6* 22.2* 24.3*  MCV 92.6 94.2 93.7 96.4  PLT 588* 506* 477* 505*   Cardiac Enzymes: No results for input(s): "CKTOTAL", "CKMB", "CKMBINDEX", "TROPONINI" in the last 168 hours. BNP: Invalid input(s): "POCBNP" CBG: No results for input(s): "GLUCAP" in the last 168 hours.  Time coordinating discharge: 50 minutes  Signed:  Naphtali Riede  [facility] 04/27/2023, 1:59 PM

## 2023-04-27 NOTE — Plan of Care (Signed)
  Problem: Education: Goal: Knowledge of vaso-occlusive preventative measures will improve Outcome: Progressing   Problem: Self-Care: Goal: Ability to incorporate actions that prevent/reduce pain crisis will improve Outcome: Progressing   Problem: Tissue Perfusion: Goal: Complications related to inadequate tissue perfusion will be avoided or minimized Outcome: Progressing   Problem: Respiratory: Goal: Pulmonary complications will be avoided or minimized Outcome: Progressing   Problem: Sensory: Goal: Pain level will decrease with appropriate interventions Outcome: Progressing   Problem: Pain Management: Goal: General experience of comfort will improve Outcome: Progressing   Problem: Safety: Goal: Ability to remain free from injury will improve Outcome: Progressing

## 2023-04-27 NOTE — TOC Initial Note (Signed)
Transition of Care [facility]) - Initial/Assessment Note    Patient Details  Name: Molly Gutierrez MRN: [id number] Date of Birth: 02-27-1981  [facility] CM/SW Contact:    Adrian Prows, RN Phone Number: 04/27/2023, 3:11 PM  Clinical Narrative:                 Sherron Monday w/ pt in room; she identified POC Lener Gomer (father) [phone]; pt says she is from home and she plans to return at d/c; no TOC needs.  Expected Discharge Plan: Home/Self Care Barriers to Discharge: No Barriers Identified   Patient Goals and CMS Choice Patient states their goals for this hospitalization and ongoing recovery are:: home          Expected Discharge Plan and Services   Discharge Planning Services: CM Consult Post Acute Care Choice: NA Living arrangements for the past 2 months: Apartment Expected Discharge Date: 04/27/23                 DME Agency: NA         HH Agency: NA        Prior Living Arrangements/Services Living arrangements for the past 2 months: Apartment Lives with:: Minor Children Patient language and need for interpreter reviewed:: Yes Do you feel safe going back to the place where you live?: Yes      Need for Family Participation in Patient Care: Yes (Comment) Care giver support system in place?: Yes (comment) Current home services:  (n/a) Criminal Activity/Legal Involvement Pertinent to Current Situation/Hospitalization: No - Comment as needed  Activities of Daily Living   ADL Screening (condition at time of admission) Independently performs ADLs?: Yes (appropriate for developmental age) Is the patient deaf or have difficulty hearing?: No Does the patient have difficulty seeing, even when wearing glasses/contacts?: No Does the patient have difficulty concentrating, remembering, or making decisions?: No  Permission Sought/Granted Permission sought to share information with : Case Manager Permission granted to share information with :  Yes, Verbal Permission Granted  Share Information with NAME: Case Manager     Permission granted to share info w Relationship: Melina Roskos (father) [phone]     Emotional Assessment Appearance:: Appears stated age Attitude/Demeanor/Rapport: Gracious Affect (typically observed): Accepting Orientation: : Oriented to Self, Oriented to Place, Oriented to  Time, Oriented to Situation Alcohol / Substance Use: Not Applicable Psych Involvement: No (comment)  Admission diagnosis:  Sickle cell pain crisis (HCC) [D57.00] Patient Active Problem List   Diagnosis Date Noted   Acute sickle cell crisis (HCC) 10/04/2021   Atypical chest pain 06/03/2021   Chronic pain syndrome 11/22/2020   Sickle cell disease with crisis (HCC) 10/05/2020   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   Sickle cell anemia with pain (HCC) 04/24/2020   Mild intermittent asthma without complication 03/31/2020   Sickle cell anemia with crisis (HCC) 01/07/2020   Sickle cell crisis acute chest syndrome (HCC) 07/29/2019   Drug-seeking behavior 07/07/2019   Sinus tachycardia 07/07/2019   Therapeutic opioid-induced constipation (OIC) 07/07/2019   Breast mass in female 06/29/2019   Atelectasis of right lung 06/29/2019   Sickle cell crisis (HCC) 06/27/2019   Sickle cell pain crisis (HCC) 04/19/2019   Pneumonia 03/27/2019   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   Anterior chest wall pain 11/13/2018   Epigastric abdominal pain 11/13/2018   Hematuria 11/13/2018   Hyperbilirubinemia 11/12/2018   Long term current use of anticoagulant therapy 11/12/2018   History of pulmonary embolism 09/26/2018   Opioid dependence (HCC) 09/13/2018   Port-A-Cath  in place 09/13/2018   Narcotic abuse, continuous (HCC) 08/28/2018   Anemia 07/03/2018   Personal history of other venous thrombosis and embolism 07/03/2018   History of transfusion 07/03/2018   Gastro-esophageal reflux disease without esophagitis 06/02/2018   Asthma 05/19/2018    Anxiety and depression 10/30/2017   At risk for sepsis 06/13/2017   Mitral regurgitation 02/01/2014   Itching 12/13/2013   Nausea and vomiting 12/13/2013   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   Frequent complaints of pain 11/01/2013   Hypokalemia 09/19/2013   S/P total hip arthroplasty 05/19/2013   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 [place] [address] Phone: [phone] Fax: [phone]  [facility] [place], [place] - [address] [place] [address] Phone: [phone] Fax: [phone]     Social Determinants of Health (SDOH) Social History: SDOH Screenings   Food Insecurity: No Food Insecurity (04/27/2023)  Housing: Patient Declined (04/27/2023)  Transportation Needs: No Transportation Needs (04/27/2023)  Utilities: Not At Risk (04/27/2023)  Recent Concern: Utilities - Medium Risk (03/18/2023)   Received from [facility]  Alcohol Screen: Low Risk  (03/12/2021)  Depression (PHQ2-9): Low Risk  (10/16/2022)  Financial Resource Strain: Low Risk  (07/18/2022)  Physical Activity: Inactive (07/18/2022)  Social Connections: Unknown (03/03/2023)   Received from [facility]  Stress: No Stress Concern Present (07/18/2022)  Tobacco Use: Low Risk  (04/22/2023)   SDOH Interventions: Food Insecurity Interventions: Intervention Not  Indicated, Inpatient TOC Housing Interventions: Intervention Not Indicated, Inpatient TOC Transportation Interventions: Inpatient TOC, Intervention Not Indicated Utilities Interventions: Intervention Not Indicated, Inpatient TOC   Readmission Risk Interventions    04/27/2023    3:09 PM 03/05/2023    3:47 PM 04/03/2022   11:42 AM  Readmission Risk Prevention Plan  Transportation Screening Complete Complete Complete  PCP or Specialist Appt within 3-5 Days Complete    HRI or Home Care Consult Complete    Social Work Consult for Recovery Care Planning/Counseling Complete Complete   Palliative Care Screening Not Applicable Not Applicable   Medication Review Oceanographer) Complete Referral to Pharmacy Complete  PCP or Specialist appointment within 3-5 days of discharge   Complete  HRI or Home Care Consult   Complete  SW Recovery Care/Counseling Consult   Complete  Palliative Care Screening   Not Applicable  Skilled Nursing Facility   Not Applicable

## 2023-04-29 ENCOUNTER — Telehealth: Payer: Self-pay

## 2023-04-29 NOTE — Transitions of Care (Post Inpatient/ED Visit) (Signed)
04/29/2023  Name: Molly Gutierrez MRN: [id number] DOB: 24-Nov-1980  Today's TOC FU Call Status: Today's TOC FU Call Status:: Successful TOC FU Call Completed TOC FU Call Complete Date: 04/29/23 Patient's Name and Date of Birth confirmed.  Transition Care Management Follow-up Telephone Call Date of Discharge: 04/27/23 Discharge Facility: [facility]) Type of Discharge: Inpatient Admission Primary Inpatient Discharge Diagnosis:: sickle cell crisis How have you been since you were released from the hospital?: Better (reports she is resting and relaxing) Any questions or concerns?: No  Items Reviewed: Did you receive and understand the discharge instructions provided?: Yes Medications obtained,verified, and reconciled?: Yes (Medications Reviewed) Any new allergies since your discharge?: No Dietary orders reviewed?: Yes Type of Diet Ordered:: regular diet Do you have support at home?: Yes People in Home: parent(s)  Medications Reviewed Today: Medications Reviewed Today     Reviewed by Earlie Server, RN (Registered Nurse) on 04/29/23 at 1649  Med List Status: <None>   Medication Order Taking? Sig Documenting Provider Last Dose Status Informant  acetaminophen (TYLENOL) 500 MG tablet [id number] Yes Take 500 mg by mouth every 6 (six) hours as needed for mild pain or headache (or cramps). [provider] Taking Active Self, Pharmacy Records  albuterol (VENTOLIN HFA) 108 (90 Base) MCG/ACT inhaler [id number] Yes TAKE 2 PUFFS BY MOUTH EVERY 6 HOURS AS NEEDED FOR WHEEZE OR SHORTNESS OF BREATH  Patient taking differently: Inhale 2 puffs into the lungs every 6 (six) hours as needed for wheezing or shortness of breath.   [name], NP Taking Active Self, Pharmacy Records  budesonide-formoterol [facility]) 80-4.5 MCG/ACT inhaler [id number] Yes INHALE 2 PUFFS INTO THE LUNGS TWICE A DAY  Patient taking differently: Inhale 2 puffs into the lungs 2 (two) times daily.    [name], NP Taking Active Self, Pharmacy Records  Cholecalciferol (VITAMIN D3 PO) [id number] Yes Take 1 capsule by mouth at bedtime. [provider] Taking Active Self, Pharmacy Records           Med Note Antony Madura, Linna Darner Mar 04, 2023  5:51 PM) Strength not noted  Cyanocobalamin (VITAMIN B-12 PO) [id number] Yes Take 1 tablet by mouth at bedtime. [provider] Taking Active Self, Pharmacy Records           Med Note Antony Madura, Arn Medal   Tue Mar 04, 2023  5:51 PM) Strength not noted  Deferasirox (JADENU) 360 MG TABS [id number] No Take 3 tablets (1,080 mg total) by mouth daily.  Patient not taking: Reported on 04/29/2023   [name], NP Not Taking Active Self, Pharmacy Records           Med Note (ROSE, Bunnie Domino Apr 29, 2023  4:47 PM) Reports being out of medication  diphenhydrAMINE (BENADRYL) 25 MG tablet [id number] Yes Take 25 mg by mouth See admin instructions. Take 25 mg by mouth every four to six hours with each dose of Hydromorphone [provider] Taking Active Self, Pharmacy Records           Med Note [name], REGEENA   Thu Oct 05, 2020  5:11 PM)    fluticasone (FLONASE) 50 MCG/ACT nasal spray [id number]  Place 1 spray into both nostrils daily as needed for allergies or rhinitis. [name], NP  Expired 04/23/23 2359 Self, Pharmacy Records  folic acid (FOLVITE) 1 MG tablet [id number] Yes Take 1 tablet (1 mg total) by mouth daily. [name], NP Taking Active Self,  Pharmacy Records  HYDROmorphone (DILAUDID) 4 MG tablet [id number] Yes Take 1 tablet (4 mg total) by mouth every 4 to 6 hours as needed for pain [name], NP Taking Active   mirtazapine (REMERON) 45 MG tablet [id number] Yes Take 1 tablet (45 mg total) by mouth at bedtime. [name], NP Taking Active Self, Pharmacy Records  naloxone [facility]) nasal spray 4 mg/0.1 mL [id number] No Place 0.1 sprays (0.4 mg total) into the nose once as needed (opioid overdose).  Patient  not taking: Reported on 04/29/2023   [name], NP Not Taking Active Self, Pharmacy Records  OXBRYTA 500 MG TABS tablet [id number] No Take 1,500 mg by mouth daily.  Patient not taking: Reported on 03/04/2023   [name], NP Not Taking Active Self, Pharmacy Records           Med Note Wyline Mood   Fri Apr 18, 2023  1:49 AM) Reports PCP called her mid October and requested she stop taking the medication due to newly reported adverse effects.  States she was weaned off the [place] and no longer taking it  polyethylene glycol (MIRALAX / GLYCOLAX) 17 g packet [id number] No Take 17 g by mouth daily as needed for mild constipation (MIX AS DIRECTED AND DRINK).  Patient not taking: Reported on 04/29/2023   [name], NP Not Taking Active Self, Pharmacy Records  promethazine (PHENERGAN) 25 MG tablet [id number]  Take 0.5-1 tablets (12.5-25 mg total) by mouth every 6 (six) hours as needed for nausea or vomiting. [name], NP  Expired 04/23/23 2359 Self, Pharmacy Records  Vitamin D, Ergocalciferol, (DRISDOL) 1.25 MG (50000 UNIT) CAPS capsule [id number] Yes Take 1 capsule (50,000 Units total) by mouth once a week.  Patient taking differently: Take 50,000 Units by mouth every Wednesday.   [name], NP Taking Active Self, Pharmacy Records  XARELTO 20 MG TABS tablet [id number]  Take 1 tablet (20 mg total) by mouth daily with supper.  Patient taking differently: Take 20 mg by mouth at bedtime.   [name], NP  Expired 03/04/23 2359 Self           Med Note Malachy Moan Mar 04, 2023  5:26 PM)    Med List Note Salvatore Marvel, [place] 04/02/22 1640): The "k" is in the patient's first name is SILENT              Home Care and Equipment/Supplies: Were Home Health Services Ordered?: No Any new equipment or medical supplies ordered?: No  Functional Questionnaire: Do you need assistance with bathing/showering or dressing?: No Do you need assistance with meal  preparation?: No Do you need assistance with eating?: No Do you have difficulty maintaining continence: No Do you need assistance with getting out of bed/getting out of a chair/moving?: No Do you have difficulty managing or taking your medications?: No  Follow up appointments reviewed: PCP Follow-up appointment confirmed?: Yes Date of PCP follow-up appointment?: 05/01/23 Follow-up Provider: Angus Seller Specialist [facility] Follow-up appointment confirmed?: No Do you need transportation to your follow-up appointment?: No Do you understand care options if your condition(s) worsen?: Yes-patient verbalized understanding  SDOH Interventions Today    Flowsheet Row Most Recent Value  SDOH Interventions   Food Insecurity Interventions Intervention Not Indicated  Housing Interventions Intervention Not Indicated  Transportation Interventions Intervention Not Indicated  Utilities Interventions Intervention Not Indicated     Reviewed discharge instruction with patient.   Reviewed and  encouraged patient to call PCP for problems or concerns. Reviewed that MD office always has someone on call.  Reviewed with patient to check the expiration date on her narcan and if expired notify her MD at her hospital follow up appointment on 05/01/2023. Also reviewed with patient to request refills that she needs when she sees her MD this week. Offered 30 day TOC program and patient declined. Provided my contact information if patient has a question and needs to call me back.  Lonia Chimera, RN, BSN, CEN [employer]- Transition of Care Team.  [facility] [phone]

## 2023-05-01 ENCOUNTER — Ambulatory Visit: Payer: Self-pay | Admitting: Nurse Practitioner

## 2023-05-04 DIAGNOSIS — D5700 Hb-SS disease with crisis, unspecified: Secondary | ICD-10-CM | POA: Diagnosis not present

## 2023-05-05 ENCOUNTER — Encounter: Payer: Self-pay | Admitting: Nurse Practitioner

## 2023-05-05 ENCOUNTER — Other Ambulatory Visit (HOSPITAL_COMMUNITY): Payer: Self-pay

## 2023-05-05 ENCOUNTER — Encounter (HOSPITAL_COMMUNITY): Payer: Self-pay | Admitting: Emergency Medicine

## 2023-05-05 ENCOUNTER — Inpatient Hospital Stay (HOSPITAL_COMMUNITY)
Admission: EM | Admit: 2023-05-05 | Discharge: 2023-05-09 | DRG: 812 | Disposition: A | Discharge status: Home / Self Care | Payer: Medicare HMO | Attending: Internal Medicine | Admitting: Internal Medicine

## 2023-05-05 ENCOUNTER — Other Ambulatory Visit: Payer: Self-pay

## 2023-05-05 ENCOUNTER — Encounter (HOSPITAL_COMMUNITY): Payer: Self-pay

## 2023-05-05 ENCOUNTER — Ambulatory Visit (INDEPENDENT_AMBULATORY_CARE_PROVIDER_SITE_OTHER): Payer: Medicare HMO | Admitting: Nurse Practitioner

## 2023-05-05 ENCOUNTER — Non-Acute Institutional Stay (HOSPITAL_BASED_OUTPATIENT_CLINIC_OR_DEPARTMENT_OTHER)
Admission: AD | Admit: 2023-05-05 | Discharge: 2023-05-05 | Disposition: A | Discharge status: Home / Self Care | Payer: Medicare HMO | Source: Ambulatory Visit | Attending: Internal Medicine | Admitting: Internal Medicine

## 2023-05-05 DIAGNOSIS — D5700 Hb-SS disease with crisis, unspecified: Principal | ICD-10-CM | POA: Diagnosis present

## 2023-05-05 DIAGNOSIS — Z881 Allergy status to other antibiotic agents status: Secondary | ICD-10-CM | POA: Diagnosis not present

## 2023-05-05 DIAGNOSIS — R9431 Abnormal electrocardiogram [ECG] [EKG]: Secondary | ICD-10-CM | POA: Diagnosis not present

## 2023-05-05 DIAGNOSIS — Z888 Allergy status to other drugs, medicaments and biological substances status: Secondary | ICD-10-CM

## 2023-05-05 DIAGNOSIS — Z833 Family history of diabetes mellitus: Secondary | ICD-10-CM | POA: Diagnosis not present

## 2023-05-05 DIAGNOSIS — J4540 Moderate persistent asthma, uncomplicated: Secondary | ICD-10-CM

## 2023-05-05 DIAGNOSIS — Z884 Allergy status to anesthetic agent status: Secondary | ICD-10-CM

## 2023-05-05 DIAGNOSIS — Z7951 Long term (current) use of inhaled steroids: Secondary | ICD-10-CM

## 2023-05-05 DIAGNOSIS — D638 Anemia in other chronic diseases classified elsewhere: Secondary | ICD-10-CM | POA: Diagnosis not present

## 2023-05-05 DIAGNOSIS — F1120 Opioid dependence, uncomplicated: Secondary | ICD-10-CM | POA: Diagnosis present

## 2023-05-05 DIAGNOSIS — R6510 Systemic inflammatory response syndrome (SIRS) of non-infectious origin without acute organ dysfunction: Secondary | ICD-10-CM

## 2023-05-05 DIAGNOSIS — Z79899 Other long term (current) drug therapy: Secondary | ICD-10-CM

## 2023-05-05 DIAGNOSIS — Z8249 Family history of ischemic heart disease and other diseases of the circulatory system: Secondary | ICD-10-CM | POA: Diagnosis not present

## 2023-05-05 DIAGNOSIS — G894 Chronic pain syndrome: Secondary | ICD-10-CM | POA: Diagnosis present

## 2023-05-05 DIAGNOSIS — Z841 Family history of disorders of kidney and ureter: Secondary | ICD-10-CM | POA: Diagnosis not present

## 2023-05-05 DIAGNOSIS — Z83438 Family history of other disorder of lipoprotein metabolism and other lipidemia: Secondary | ICD-10-CM

## 2023-05-05 DIAGNOSIS — Z86711 Personal history of pulmonary embolism: Secondary | ICD-10-CM | POA: Diagnosis not present

## 2023-05-05 DIAGNOSIS — D72829 Elevated white blood cell count, unspecified: Secondary | ICD-10-CM | POA: Diagnosis present

## 2023-05-05 DIAGNOSIS — J4520 Mild intermittent asthma, uncomplicated: Secondary | ICD-10-CM | POA: Insufficient documentation

## 2023-05-05 DIAGNOSIS — Z7901 Long term (current) use of anticoagulants: Secondary | ICD-10-CM | POA: Diagnosis not present

## 2023-05-05 LAB — CBC WITH DIFFERENTIAL/PLATELET
Abs Immature Granulocytes: 0.1 10*3/uL — ABNORMAL HIGH (ref 0.00–0.07)
Abs Immature Granulocytes: 0.08 10*3/uL — ABNORMAL HIGH (ref 0.00–0.07)
Basophils Absolute: 0.1 10*3/uL (ref 0.0–0.1)
Basophils Absolute: 0.1 10*3/uL (ref 0.0–0.1)
Basophils Relative: 0 %
Basophils Relative: 1 %
Eosinophils Absolute: 0.2 10*3/uL (ref 0.0–0.5)
Eosinophils Absolute: 0.3 10*3/uL (ref 0.0–0.5)
Eosinophils Relative: 1 %
Eosinophils Relative: 2 %
HCT: 24 % — ABNORMAL LOW (ref 36.0–46.0)
HCT: 22.5 % — ABNORMAL LOW (ref 36.0–46.0)
Hemoglobin: 8.3 g/dL — ABNORMAL LOW (ref 12.0–15.0)
Hemoglobin: 8 g/dL — ABNORMAL LOW (ref 12.0–15.0)
Immature Granulocytes: 1 %
Immature Granulocytes: 1 %
Lymphocytes Relative: 18 %
Lymphocytes Relative: 26 %
Lymphs Abs: 3 10*3/uL (ref 0.7–4.0)
Lymphs Abs: 4 10*3/uL (ref 0.7–4.0)
MCH: 32.5 pg (ref 26.0–34.0)
MCH: 32.8 pg (ref 26.0–34.0)
MCHC: 34.6 g/dL (ref 30.0–36.0)
MCHC: 35.6 g/dL (ref 30.0–36.0)
MCV: 94.1 fL (ref 80.0–100.0)
MCV: 92.2 fL (ref 80.0–100.0)
Monocytes Absolute: 1.9 10*3/uL — ABNORMAL HIGH (ref 0.1–1.0)
Monocytes Absolute: 2 10*3/uL — ABNORMAL HIGH (ref 0.1–1.0)
Monocytes Relative: 11 %
Monocytes Relative: 13 %
Neutro Abs: 11.9 10*3/uL — ABNORMAL HIGH (ref 1.7–7.7)
Neutro Abs: 9 10*3/uL — ABNORMAL HIGH (ref 1.7–7.7)
Neutrophils Relative %: 69 %
Neutrophils Relative %: 57 %
Platelets: 510 10*3/uL — ABNORMAL HIGH (ref 150–400)
Platelets: 478 10*3/uL — ABNORMAL HIGH (ref 150–400)
RBC: 2.55 MIL/uL — ABNORMAL LOW (ref 3.87–5.11)
RBC: 2.44 MIL/uL — ABNORMAL LOW (ref 3.87–5.11)
RDW: 20.8 % — ABNORMAL HIGH (ref 11.5–15.5)
RDW: 20.3 % — ABNORMAL HIGH (ref 11.5–15.5)
WBC: 17.2 10*3/uL — ABNORMAL HIGH (ref 4.0–10.5)
WBC: 15.5 10*3/uL — ABNORMAL HIGH (ref 4.0–10.5)
nRBC: 0.6 % — ABNORMAL HIGH (ref 0.0–0.2)
nRBC: 0.5 % — ABNORMAL HIGH (ref 0.0–0.2)

## 2023-05-05 LAB — RETICULOCYTES
Immature Retic Fract: 24.3 % — ABNORMAL HIGH (ref 2.3–15.9)
Immature Retic Fract: 18.4 % — ABNORMAL HIGH (ref 2.3–15.9)
RBC.: 2.53 MIL/uL — ABNORMAL LOW (ref 3.87–5.11)
RBC.: 2.45 MIL/uL — ABNORMAL LOW (ref 3.87–5.11)
Retic Count, Absolute: 502.5 10*3/uL — ABNORMAL HIGH (ref 19.0–186.0)
Retic Count, Absolute: 399 10*3/uL — ABNORMAL HIGH (ref 19.0–186.0)
Retic Ct Pct: 19 % — ABNORMAL HIGH (ref 0.4–3.1)
Retic Ct Pct: 16.3 % — ABNORMAL HIGH (ref 0.4–3.1)

## 2023-05-05 LAB — COMPREHENSIVE METABOLIC PANEL
ALT: 19 U/L (ref 0–44)
ALT: 19 U/L (ref 0–44)
AST: 34 U/L (ref 15–41)
AST: 33 U/L (ref 15–41)
Albumin: 4 g/dL (ref 3.5–5.0)
Albumin: 4.1 g/dL (ref 3.5–5.0)
Alkaline Phosphatase: 59 U/L (ref 38–126)
Alkaline Phosphatase: 53 U/L (ref 38–126)
Anion gap: 12 (ref 5–15)
Anion gap: 6 (ref 5–15)
BUN: 10 mg/dL (ref 6–20)
BUN: 10 mg/dL (ref 6–20)
CO2: 20 mmol/L — ABNORMAL LOW (ref 22–32)
CO2: 21 mmol/L — ABNORMAL LOW (ref 22–32)
Calcium: 8.7 mg/dL — ABNORMAL LOW (ref 8.9–10.3)
Calcium: 8.6 mg/dL — ABNORMAL LOW (ref 8.9–10.3)
Chloride: 105 mmol/L (ref 98–111)
Chloride: 109 mmol/L (ref 98–111)
Creatinine, Ser: 0.52 mg/dL (ref 0.44–1.00)
Creatinine, Ser: 0.61 mg/dL (ref 0.44–1.00)
GFR, Estimated: >60 mL/min (ref >60)
GFR, Estimated: >60 mL/min (ref >60)
Glucose, Bld: 92 mg/dL (ref 70–99)
Glucose, Bld: 97 mg/dL (ref 70–99)
Potassium: 3.9 mmol/L (ref 3.5–5.1)
Potassium: 3.8 mmol/L (ref 3.5–5.1)
Sodium: 137 mmol/L (ref 135–145)
Sodium: 136 mmol/L (ref 135–145)
Total Bilirubin: 2.2 mg/dL — ABNORMAL HIGH (ref <1.2)
Total Bilirubin: 2.3 mg/dL — ABNORMAL HIGH (ref <1.2)
Total Protein: 7.8 g/dL (ref 6.5–8.1)
Total Protein: 7.7 g/dL (ref 6.5–8.1)

## 2023-05-05 LAB — HCG, QUANTITATIVE, PREGNANCY: hCG, Beta Chain, Quant, S: <1 m[IU]/mL (ref <5)

## 2023-05-05 MED ORDER — DIPHENHYDRAMINE HCL 25 MG PO CAPS
25.0000 mg | ORAL_CAPSULE | ORAL | Status: DC | PRN
  Filled 2023-05-05: qty 1

## 2023-05-05 MED ORDER — HYDROMORPHONE HCL 4 MG PO TABS
4.0000 mg | ORAL_TABLET | ORAL | 0 refills | Status: DC
Start: 2023-05-08 — End: 2023-05-09

## 2023-05-05 MED ORDER — HEPARIN SOD (PORK) LOCK FLUSH 100 UNIT/ML IV SOLN
500.0000 [IU] | INTRAVENOUS | Status: AC | PRN
  Administered 2023-05-05: 500 [IU]

## 2023-05-05 MED ORDER — SODIUM CHLORIDE 0.9 % IV SOLN
12.5000 mg | Freq: Once | INTRAVENOUS | Status: AC
  Administered 2023-05-05: 12.5 mg via INTRAVENOUS
  Filled 2023-05-05: qty 12.5

## 2023-05-05 MED ORDER — HYDROMORPHONE 1 MG/ML IV SOLN
INTRAVENOUS | Status: DC
  Administered 2023-05-05: 10 mg via INTRAVENOUS
  Administered 2023-05-05: 30 mg via INTRAVENOUS
  Filled 2023-05-05: qty 30

## 2023-05-05 MED ORDER — HYDROMORPHONE HCL 2 MG/ML IJ SOLN
2.0000 mg | INTRAMUSCULAR | Status: AC
Start: 2023-05-05 — End: 2023-05-05
  Administered 2023-05-05: 2 mg via INTRAVENOUS
  Filled 2023-05-05: qty 1

## 2023-05-05 MED ORDER — ONDANSETRON HCL 4 MG/2ML IJ SOLN
4.0000 mg | Freq: Four times a day (QID) | INTRAMUSCULAR | Status: DC | PRN
  Filled 2023-05-05: qty 2

## 2023-05-05 MED ORDER — SODIUM CHLORIDE 0.9% FLUSH
10.0000 mL | INTRAVENOUS | Status: AC | PRN
  Administered 2023-05-05: 10 mL

## 2023-05-05 MED ORDER — SODIUM CHLORIDE 0.9% FLUSH: 9.0000 mL | INTRAVENOUS | Status: DC | PRN

## 2023-05-05 MED ORDER — HYDROMORPHONE HCL 2 MG/ML IJ SOLN
2.0000 mg | INTRAMUSCULAR | Status: DC | PRN
  Administered 2023-05-06 (×5): 2 mg via INTRAVENOUS
  Filled 2023-05-05 (×5): qty 1

## 2023-05-05 MED ORDER — ACETAMINOPHEN 500 MG PO TABS
1000.0000 mg | ORAL_TABLET | Freq: Once | ORAL | Status: AC
  Administered 2023-05-05: 1000 mg via ORAL
  Filled 2023-05-05: qty 2

## 2023-05-05 MED ORDER — SODIUM CHLORIDE 0.45 % IV SOLN: INTRAVENOUS | Status: DC

## 2023-05-05 MED ORDER — HYDROMORPHONE HCL 2 MG/ML IJ SOLN
2.0000 mg | INTRAMUSCULAR | Status: AC
  Administered 2023-05-05: 2 mg via INTRAVENOUS
  Filled 2023-05-05: qty 1

## 2023-05-05 MED ORDER — ONDANSETRON HCL 4 MG/2ML IJ SOLN: 4.0000 mg | INTRAMUSCULAR | Status: DC | PRN

## 2023-05-05 MED ORDER — NALOXONE HCL 0.4 MG/ML IJ SOLN: 0.4000 mg | INTRAMUSCULAR | Status: DC | PRN

## 2023-05-05 MED ORDER — KETOROLAC TROMETHAMINE 15 MG/ML IJ SOLN
15.0000 mg | INTRAMUSCULAR | Status: AC
  Administered 2023-05-05: 15 mg via INTRAVENOUS
  Filled 2023-05-05: qty 1

## 2023-05-05 MED ORDER — KETOROLAC TROMETHAMINE 30 MG/ML IJ SOLN
15.0000 mg | Freq: Once | INTRAMUSCULAR | Status: AC
  Administered 2023-05-05: 15 mg via INTRAVENOUS
  Filled 2023-05-05: qty 1

## 2023-05-05 MED ORDER — DEFERASIROX 360 MG PO TABS
1080.0000 mg | ORAL_TABLET | Freq: Every day | ORAL | 0 refills | Status: DC
  Filled 2023-05-05: qty 90, 30d supply, fill #0

## 2023-05-05 NOTE — ED Triage Notes (Signed)
Patient arrives ambulatory by POV c/o sickle cell crisis pain to bilateral legs since either Friday night or Sunday morning.

## 2023-05-05 NOTE — Progress Notes (Signed)
Patient admitted to the day hospital for sickle cell pain. Initially, patient reported bilateral leg pain rated 9/10. For pain management, patient placed on Sickle Cell Dose Dilaudid PCA, given IV Toradol, PO Tylenol and hydrated with IV fluids. At discharge, patient rated pain at 6/10. Vital signs stable. AVS offered but patient refused. Patient alert, oriented and ambulatory at discharge.

## 2023-05-05 NOTE — H&P (Signed)
[facility] History and Physical   Date: 05/05/2023  Patient name: [facility] Medical record number: [id number] Date of birth: 02/13/1981 Age: 42 y.o. Gender: female PCP: [name], NP  Attending physician: Quentin Angst, MD  Chief Complaint: Sickle cell pain   History of Present Illness: Molly Gutierrez is a 42 year old female with a medical history significant for sickle cell disease, chronic pain syndrome, opiate dependence and tolerance, history of PE on Xarelto, history of mild intermittent asthma, and anemia of chronic disease presents with complaints of generalized pain that is consistent with her previous sickle cell pain crisis.  Patient was evaluated in primary care earlier this a.m. and agree with PCP that patient was appropriate to transition to sickle cell day infusion clinic for management of her pain crisis.  Patient states that she has been taking her home Dilaudid consistently without very much relief.  She has been attempting to manage pain at home over the past several days.  Patient [place] in the [place] area and states that she reported to the emergency department 2 days prior and was treated and discharged.  She says that pain was poorly controlled at discharge, but patient did not desire admission at that time.  Today, she rates pain as 10/10, constant, and aching.  Patient denies any fever, chills, chest pain, or shortness of breath.  No urinary symptoms, nausea, vomiting, or diarrhea.  No sick contacts or recent travel.  Meds: Medications Prior to Admission  Medication Sig Dispense Refill Last Dose   acetaminophen (TYLENOL) 500 MG tablet Take 500 mg by mouth every 6 (six) hours as needed for mild pain or headache (or cramps).      albuterol (VENTOLIN HFA) 108 (90 Base) MCG/ACT inhaler TAKE 2 PUFFS BY MOUTH EVERY 6 HOURS AS NEEDED FOR WHEEZE OR SHORTNESS OF BREATH (Patient taking differently: Inhale 2 puffs into the lungs every  6 (six) hours as needed for wheezing or shortness of breath.) 6.7 each 2    budesonide-formoterol (SYMBICORT) 80-4.5 MCG/ACT inhaler INHALE 2 PUFFS INTO THE LUNGS TWICE A DAY (Patient taking differently: Inhale 2 puffs into the lungs 2 (two) times daily.) 10.2 each 2    Cholecalciferol (VITAMIN D3 PO) Take 1 capsule by mouth at bedtime.      Cyanocobalamin (VITAMIN B-12 PO) Take 1 tablet by mouth at bedtime.      Deferasirox (JADENU) 360 MG TABS Take 3 tablets (1,080 mg total) by mouth daily. 90 tablet 0    diphenhydrAMINE (BENADRYL) 25 MG tablet Take 25 mg by mouth See admin instructions. Take 25 mg by mouth every four to six hours with each dose of Hydromorphone      fluticasone (FLONASE) 50 MCG/ACT nasal spray Place 1 spray into both nostrils daily as needed for allergies or rhinitis. 9.9 mL 0    folic acid (FOLVITE) 1 MG tablet Take 1 tablet (1 mg total) by mouth daily. 30 tablet 2    [START ON 05/08/2023] HYDROmorphone (DILAUDID) 4 MG tablet Take 1 tablet (4 mg total) by mouth as directed. Start date 04/29/23 60 tablet 0    mirtazapine (REMERON) 45 MG tablet Take 1 tablet (45 mg total) by mouth at bedtime. 90 tablet 3    naloxone (NARCAN) nasal spray 4 mg/0.1 mL Place 0.1 sprays (0.4 mg total) into the nose once as needed (opioid overdose). 1 each 2    OXBRYTA 500 MG TABS tablet Take 1,500 mg by mouth daily. 90 tablet 0  polyethylene glycol (MIRALAX / GLYCOLAX) 17 g packet Take 17 g by mouth daily as needed for mild constipation (MIX AS DIRECTED AND DRINK). 14 each 2    promethazine (PHENERGAN) 25 MG tablet Take 0.5-1 tablets (12.5-25 mg total) by mouth every 6 (six) hours as needed for nausea or vomiting. 30 tablet 11    Vitamin D, Ergocalciferol, (DRISDOL) 1.25 MG (50000 UNIT) CAPS capsule Take 1 capsule (50,000 Units total) by mouth once a week. (Patient taking differently: Take 50,000 Units by mouth every Wednesday.) 4 capsule 11    XARELTO 20 MG TABS tablet Take 1 tablet (20 mg total) by  mouth daily with supper. (Patient taking differently: Take 20 mg by mouth at bedtime.) 90 tablet 3     Allergies: Cefaclor, Hydroxyurea, Omeprazole, and Ketamine Past Medical History:  Diagnosis Date   Asthma    Eczema    History of pulmonary embolus (PE)    Sickle cell anemia (HCC)    Past Surgical History:  Procedure Laterality Date   CHOLECYSTECTOMY     ERCP     JOINT REPLACEMENT     PORTA CATH INSERTION     TUBAL LIGATION     WISDOM TOOTH EXTRACTION     Family History  Problem Relation Age of Onset   Renal Disease Mother    Hypertension Mother    High Cholesterol Mother    Heart attack Mother    Diabetes Brother    Social History   Socioeconomic History   Marital status: Single    Spouse name: Not on file   Number of children: Not on file   Years of education: Not on file   Highest education level: Not on file  Occupational History   Not on file  Tobacco Use   Smoking status: Never   Smokeless tobacco: Never  Vaping Use   Vaping status: Never Used  Substance and Sexual Activity   Alcohol use: Never   Drug use: Never   Sexual activity: Not Currently  Other Topics Concern   Not on file  Social History Narrative   Lives w/ her 2 children in [place]   Social Determinants of Health   Financial Resource Strain: Low Risk  (07/18/2022)   Overall Financial Resource Strain (CARDIA)    Difficulty of Paying Living Expenses: Not hard at all  Food Insecurity: No Food Insecurity (04/29/2023)   Hunger Vital Sign    Worried About Running Out of Food in the Last Year: Never true    Ran Out of Food in the Last Year: Never true  Transportation Needs: No Transportation Needs (04/29/2023)   PRAPARE - Administrator, Civil Service (Medical): No    Lack of Transportation (Non-Medical): No  Physical Activity: Inactive (07/18/2022)   Exercise Vital Sign    Days of Exercise per Week: 0 days    Minutes of Exercise per Session: 0 min  Stress: No Stress Concern  Present (07/18/2022)   [employer] of Occupational Health - Occupational Stress Questionnaire    Feeling of Stress : Not at all  Social Connections: Unknown (03/03/2023)   Received from [facility]   Social Network    Social Network: Not on file  Intimate Partner Violence: Not At Risk (04/29/2023)   Humiliation, Afraid, Rape, and Kick questionnaire    Fear of Current or Ex-Partner: No    Emotionally Abused: No    Physically Abused: No    Sexually Abused: No    Review of Systems  Constitutional:  Negative for chills and fever.  HENT: Negative.    Respiratory: Negative.    Gastrointestinal: Negative.   Genitourinary: Negative.   Musculoskeletal:  Positive for back pain and joint pain.  Skin: Negative.   Neurological: Negative.   Psychiatric/Behavioral: Negative.       Physical Exam: There were no vitals taken for this visit. Physical Exam Constitutional:      Appearance: Normal appearance.  Eyes:     Pupils: Pupils are equal, round, and reactive to light.  Cardiovascular:     Rate and Rhythm: Normal rate and regular rhythm.  Pulmonary:     Effort: Pulmonary effort is normal.  Abdominal:     General: Bowel sounds are normal.  Musculoskeletal:        General: Normal range of motion.  Skin:    General: Skin is warm.  Neurological:     General: No focal deficit present.     Mental Status: She is alert. Mental status is at baseline.  Psychiatric:        Mood and Affect: Mood normal.        Behavior: Behavior normal.        Thought Content: Thought content normal.        Judgment: Judgment normal.      Lab results: No results found for this or any previous visit (from the past 24 hour(s)).  Imaging results:  No results found.   Assessment & Plan: Patient admitted to sickle cell day infusion center for management of pain crisis.  Patient is opiate naive Initiate IV dilaudid PCA. Settings of  IV fluids,  Toradol 15 mg IV times one dose Tylenol 1000 mg  by mouth times one dose Review CBC with differential, complete metabolic panel, and reticulocytes as results become available. Baseline hemoglobin is Pain intensity will be reevaluated in context of functioning and relationship to baseline as care progresses If pain intensity remains elevated and/or sudden change in hemodynamic stability transition to inpatient services for higher level of care.   Sickle cell disease with pain crisis:  Patient admitted to sickle cell day infusion center for management of pain crisis.  Patient is opiate tolerant Initiate IV dilaudid PCA.Toradol 15 mg IV times one dose Tylenol 1000 mg by mouth times one dose Review CBC with differential, complete metabolic panel, and reticulocytes as results become available. Baseline hemoglobin is 7-8 Pain intensity will be reevaluated in context of functioning and relationship to baseline as care progresses If pain intensity remains elevated and/or sudden change in hemodynamic stability transition to inpatient services for higher level of care.     Nolon Nations  APRN, MSN, FNP-C Patient Care [facility] [address] [place], [place] [address] [phone]  05/05/2023, 11:06 AM

## 2023-05-05 NOTE — Patient Instructions (Signed)
1. Sickle cell anemia with pain (HCC)  - HYDROmorphone (DILAUDID) 4 MG tablet; Take 1 tablet (4 mg total) by mouth as directed. Start date 04/29/23  Dispense: 60 tablet; Refill: 0  Follow up:   Follow up in 1 month

## 2023-05-05 NOTE — Progress Notes (Signed)
Subjective   Patient ID: Molly Gutierrez, female    DOB: 1980/08/20, 42 y.o.   MRN: [id number]  No chief complaint on file.   Referring provider: [name], NP  [facility] is a 42 y.o. female with Past Medical History: No date: Asthma No date: Eczema No date: History of pulmonary embolus (PE) No date: Sickle cell anemia (HCC)  HPI  Patient presents today for sickle cell follow-up. Patient was in the hospital yesterday for sickle cell crisis.  Patient states that she is still currently having crisis with pain in legs. We discussed that she can be triaged this morning in the day hospital. Patient does need refills on her medications.  Denies f/c/s, n/v/d, hemoptysis, PND, leg swelling. Denies chest pain or edema.   Allergies  Allergen Reactions   Cefaclor Hives, Swelling and Rash   Hydroxyurea Palpitations and Other (See Comments)    Lowers "blood levels" and heart rate (causes HYPOtension); "it messes me up, it drops my levels and stuff" and Hypotension    Omeprazole Other (See Comments)    Causes "sharp pains in the stomach"   Ketamine Palpitations and Other (See Comments)    "Pt states she has had previous reaction to ketamine. States she becomes flushed, heart races, dizzy, and feels like she is going to pass out."    Immunization History  Administered Date(s) Administered   Influenza, Seasonal, Injecte, Preservative Fre 03/05/2023   Pfizer Covid-19 Vaccine Bivalent Booster 87yrs & up 09/25/2019, 10/16/2019   Tdap 07/09/2019    Tobacco History: Social History   Tobacco Use  Smoking Status Never  Smokeless Tobacco Never   Counseling given: Not Answered   Outpatient Encounter Medications as of 05/05/2023  Medication Sig   acetaminophen (TYLENOL) 500 MG tablet Take 500 mg by mouth every 6 (six) hours as needed for mild pain or headache (or cramps).   albuterol (VENTOLIN HFA) 108 (90 Base) MCG/ACT inhaler TAKE 2 PUFFS BY MOUTH EVERY 6 HOURS  AS NEEDED FOR WHEEZE OR SHORTNESS OF BREATH (Patient taking differently: Inhale 2 puffs into the lungs every 6 (six) hours as needed for wheezing or shortness of breath.)   budesonide-formoterol (SYMBICORT) 80-4.5 MCG/ACT inhaler INHALE 2 PUFFS INTO THE LUNGS TWICE A DAY (Patient taking differently: Inhale 2 puffs into the lungs 2 (two) times daily.)   Cholecalciferol (VITAMIN D3 PO) Take 1 capsule by mouth at bedtime.   Cyanocobalamin (VITAMIN B-12 PO) Take 1 tablet by mouth at bedtime.   diphenhydrAMINE (BENADRYL) 25 MG tablet Take 25 mg by mouth See admin instructions. Take 25 mg by mouth every four to six hours with each dose of Hydromorphone   folic acid (FOLVITE) 1 MG tablet Take 1 tablet (1 mg total) by mouth daily.   mirtazapine (REMERON) 45 MG tablet Take 1 tablet (45 mg total) by mouth at bedtime.   naloxone (NARCAN) nasal spray 4 mg/0.1 mL Place 0.1 sprays (0.4 mg total) into the nose once as needed (opioid overdose).   OXBRYTA 500 MG TABS tablet Take 1,500 mg by mouth daily.   polyethylene glycol (MIRALAX / GLYCOLAX) 17 g packet Take 17 g by mouth daily as needed for mild constipation (MIX AS DIRECTED AND DRINK).   Vitamin D, Ergocalciferol, (DRISDOL) 1.25 MG (50000 UNIT) CAPS capsule Take 1 capsule (50,000 Units total) by mouth once a week. (Patient taking differently: Take 50,000 Units by mouth every Wednesday.)   [DISCONTINUED] Deferasirox (JADENU) 360 MG TABS Take 3 tablets (1,080 mg total) by mouth  daily.   [DISCONTINUED] HYDROmorphone (DILAUDID) 4 MG tablet Take 1 tablet (4 mg total) by mouth every 4 to 6 hours as needed for pain   Deferasirox (JADENU) 360 MG TABS Take 3 tablets (1,080 mg total) by mouth daily.   fluticasone (FLONASE) 50 MCG/ACT nasal spray Place 1 spray into both nostrils daily as needed for allergies or rhinitis.   [START ON 05/08/2023] HYDROmorphone (DILAUDID) 4 MG tablet Take 1 tablet (4 mg total) by mouth as directed. Start date 04/29/23   promethazine  (PHENERGAN) 25 MG tablet Take 0.5-1 tablets (12.5-25 mg total) by mouth every 6 (six) hours as needed for nausea or vomiting.   XARELTO 20 MG TABS tablet Take 1 tablet (20 mg total) by mouth daily with supper. (Patient taking differently: Take 20 mg by mouth at bedtime.)   [DISCONTINUED] dextrose 5 % and 0.45 % NaCl infusion    No facility-administered encounter medications on file as of 05/05/2023.    Review of Systems  Review of Systems  Constitutional: Negative.   HENT: Negative.    Cardiovascular: Negative.   Gastrointestinal: Negative.   Allergic/Immunologic: Negative.   Neurological: Negative.   Psychiatric/Behavioral: Negative.       Objective:   BP (!) 96/51 (BP Location: Right Arm, Patient Position: Sitting, Cuff Size: Normal)   Pulse 85   Ht 5\' 3"  (1.6 m)   Wt 117 lb (53.1 kg)   SpO2 94%   BMI 20.73 kg/m   Wt Readings from Last 5 Encounters:  05/05/23 117 lb (53.1 kg)  04/22/23 111 lb 1.8 oz (50.4 kg)  03/04/23 111 lb 1.8 oz (50.4 kg)  12/23/22 111 lb 1.8 oz (50.4 kg)  12/23/22 111 lb 3.2 oz (50.4 kg)     Physical Exam Vitals and nursing note reviewed.  Constitutional:      General: She is not in acute distress.    Appearance: She is well-developed.  Cardiovascular:     Rate and Rhythm: Normal rate and regular rhythm.  Pulmonary:     Effort: Pulmonary effort is normal.     Breath sounds: Normal breath sounds.  Neurological:     Mental Status: She is alert and oriented to person, place, and time.       Assessment & Plan:   Sickle cell anemia with pain (HCC) -     HYDROmorphone HCl; Take 1 tablet (4 mg total) by mouth as directed. Start date 04/29/23  Dispense: 60 tablet; Refill: 0 -     Ambulatory referral to Hematology / Oncology  Other orders -     Deferasirox; Take 3 tablets (1,080 mg total) by mouth daily.  Dispense: 90 tablet; Refill: 0     Return in about 4 weeks (around 06/02/2023).   [name], NP 05/05/2023

## 2023-05-05 NOTE — Progress Notes (Signed)
Pharmacy Patient Advocate Encounter   Received notification from Patient Pharmacy that prior authorization for Jadenu is required/requested.   Insurance verification completed.   The patient is insured through [employer] .   Per test claim: PA required; PA submitted to above mentioned insurance via CoverMyMeds Key/confirmation #/EOC [id number] Status is pending

## 2023-05-05 NOTE — Progress Notes (Signed)
Patient requesting to come to the day hospital after primary care appointment [facility]. Patient triaged in [facility] lobby. Reports bilateral leg pain rated 9/10. Reports taking Dilaudid 4 mg at 3:00 am. COVID-19 screening done and patient denies all symptoms and exposures. Denies fever, chest pain, nausea, vomiting, diarrhea and abdominal pain. Admits to having transportation without driving self. [place], FNP notified. Patient can come to the day hospital for pain management. Patient advised and expresses an understanding.

## 2023-05-05 NOTE — Discharge Summary (Signed)
[facility] Discharge Summary   Patient ID: Molly Gutierrez MRN: [id number] DOB/AGE: May 20, 1981 42 y.o.  Admit date: 05/05/2023 Discharge date: 05/05/2023  Primary Care Physician:  [name], NP  Admission Diagnoses:  Principal Problem:   Sickle cell pain crisis [facility])   Discharge Medications:  Allergies as of 05/05/2023       Reactions   Cefaclor Hives, Swelling, Rash   Hydroxyurea Palpitations, Other (See Comments)   Lowers "blood levels" and heart rate (causes HYPOtension); "it messes me up, it drops my levels and stuff" and Hypotension   Omeprazole Other (See Comments)   Causes "sharp pains in the stomach"   Ketamine Palpitations, Other (See Comments)   "Pt states she has had previous reaction to ketamine. States she becomes flushed, heart races, dizzy, and feels like she is going to pass out."        Medication List     TAKE these medications    acetaminophen 500 MG tablet Commonly known as: TYLENOL Take 500 mg by mouth every 6 (six) hours as needed for mild pain or headache (or cramps).   albuterol 108 (90 Base) MCG/ACT inhaler Commonly known as: VENTOLIN HFA TAKE 2 PUFFS BY MOUTH EVERY 6 HOURS AS NEEDED FOR WHEEZE OR SHORTNESS OF BREATH What changed: See the new instructions.   Deferasirox 360 MG Tabs Commonly known as: Jadenu Take 3 tablets (1,080 mg total) by mouth daily.   diphenhydrAMINE 25 MG tablet Commonly known as: BENADRYL Take 25 mg by mouth See admin instructions. Take 25 mg by mouth every four to six hours with each dose of Hydromorphone   fluticasone 50 MCG/ACT nasal spray Commonly known as: FLONASE Place 1 spray into both nostrils daily as needed for allergies or rhinitis.   folic acid 1 MG tablet Commonly known as: FOLVITE Take 1 tablet (1 mg total) by mouth daily.   HYDROmorphone 4 MG tablet Commonly known as: DILAUDID Take 1 tablet (4 mg total) by mouth as directed. Start date 04/29/23 Start taking on:  May 08, 2023   mirtazapine 45 MG tablet Commonly known as: REMERON Take 1 tablet (45 mg total) by mouth at bedtime.   naloxone 4 MG/0.1ML Liqd nasal spray kit Commonly known as: NARCAN Place 0.1 sprays (0.4 mg total) into the nose once as needed (opioid overdose).   Oxbryta 500 MG Tabs tablet Generic drug: voxelotor Take 1,500 mg by mouth daily.   polyethylene glycol 17 g packet Commonly known as: MIRALAX / GLYCOLAX Take 17 g by mouth daily as needed for mild constipation (MIX AS DIRECTED AND DRINK).   promethazine 25 MG tablet Commonly known as: PHENERGAN Take 0.5-1 tablets (12.5-25 mg total) by mouth every 6 (six) hours as needed for nausea or vomiting.   Symbicort 80-4.5 MCG/ACT inhaler Generic drug: budesonide-formoterol INHALE 2 PUFFS INTO THE LUNGS TWICE A DAY What changed: See the new instructions.   VITAMIN B-12 PO Take 1 tablet by mouth at bedtime.   Vitamin D (Ergocalciferol) 1.25 MG (50000 UNIT) Caps capsule Commonly known as: DRISDOL Take 1 capsule (50,000 Units total) by mouth once a week. What changed: when to take this   VITAMIN D3 PO Take 1 capsule by mouth at bedtime.   Xarelto 20 MG Tabs tablet Generic drug: rivaroxaban Take 1 tablet (20 mg total) by mouth daily with supper. What changed: when to take this         Consults:  None  Significant Diagnostic Studies:  DG Chest Portable 1 View  Result Date: 04/22/2023 CLINICAL DATA:  Chest and back pain EXAM: PORTABLE CHEST 1 VIEW COMPARISON:  12/23/2022 FINDINGS: Single frontal view of the chest demonstrate stable bilateral chest wall port. Cardiac silhouette is unremarkable. No acute airspace disease, effusion, or pneumothorax. No acute bony abnormality. IMPRESSION: 1. Stable chest, no acute process. Electronically Signed   By: Sharlet Salina M.D.   On: 04/22/2023 21:21    History of Present Illness: Molly Gutierrez is a 42 year old female with a medical history significant for sickle  cell disease, chronic pain syndrome, opiate dependence and tolerance, history of PE on Xarelto, history of mild intermittent asthma, and anemia of chronic disease presents with complaints of generalized pain that is consistent with her previous sickle cell pain crisis.  Patient was evaluated in primary care earlier this a.m. and agree with PCP that patient was appropriate to transition to sickle cell day infusion clinic for management of her pain crisis.  Patient states that she has been taking her home Dilaudid consistently without very much relief.  She has been attempting to manage pain at home over the past several days.  Patient lives in the [place] area and states that she reported to the emergency department 2 days prior and was treated and discharged.  She says that pain was poorly controlled at discharge, but patient did not desire admission at that time.  Today, she rates pain as 10/10, constant, and aching.  Patient denies any fever, chills, chest pain, or shortness of breath.  No urinary symptoms, nausea, vomiting, or diarrhea.  No sick contacts or recent travel. [facility] Course: Patient admitted to sickle cell day infusion center for management of pain crisis. Reviewed labs, consistent with patient's baseline.  Pain managed with IV Dilaudid PCA, IV fluids, IV Toradol, and Tylenol. Patient's pain intensity decreased slightly.  Patient advised to return to clinic in a.m. if pain persists. Patient alert, oriented, and ambulating without assistance.  She will discharge home in a hemodynamically stable condition.  Discharge instructions: Resume all home medications.   Follow up with PCP as previously  scheduled.   Discussed the importance of drinking 64 ounces of water daily, dehydration of red blood cells may lead further sickling.   Avoid all stressors that precipitate sickle cell pain crisis.     The patient was given clear instructions to go to ER or return to medical  center if symptoms do not improve, worsen or new problems develop.   Physical Exam at Discharge:  BP 116/71 (BP Location: Left Arm)   Pulse 88   Temp 99 F (37.2 C) (Temporal)   Resp 12   LMP 04/14/2023   SpO2 96%   Physical Exam Constitutional:      Appearance: Normal appearance.  Eyes:     Pupils: Pupils are equal, round, and reactive to light.  Cardiovascular:     Rate and Rhythm: Normal rate.  Pulmonary:     Effort: Pulmonary effort is normal.  Abdominal:     General: Bowel sounds are normal.  Musculoskeletal:        General: Normal range of motion.  Neurological:     Mental Status: She is alert.  Psychiatric:        Mood and Affect: Mood normal.        Behavior: Behavior normal.        Thought Content: Thought content normal.        Judgment: Judgment normal.     Disposition at Discharge: Discharge  disposition: 01-Home or Self Care       Discharge Orders: Discharge Instructions     Discharge patient   Complete by: As directed    Discharge disposition: 01-Home or Self Care   Discharge patient date: 05/05/2023       Condition at Discharge:   Stable  Time spent on Discharge:  Greater than 30 minutes.  Signed: Nolon Nations  APRN, MSN, FNP-C [facility] [address] [place], [place] [address] [phone]  05/05/2023, 4:06 PM

## 2023-05-05 NOTE — H&P (Incomplete)
History and Physical      [employer] ZOX:[id number] DOB: [birth year] DOA: 05/05/2023; DOS: 05/05/2023  PCP: [name], NP *** Patient coming from: home ***  I have personally briefly reviewed patient's old medical records in [facility]  Chief Complaint: ***  HPI: Molly Gutierrez is a 42 y.o. female with medical history significant for *** who is admitted to [facility] on 05/05/2023 with *** after presenting from home*** to [facility] ED complaining of ***.   ***        ***  ED Course:  Vital signs in the ED were notable for the following: ***  Labs were notable for the following: ***  Per my interpretation, EKG in ED demonstrated the following:  ***  Imaging in the ED, per corresponding formal radiology read, was notable for the following: ***  While in the ED, the following were administered: ***  Subsequently, the patient was admitted  ***  ***red   Review of Systems: As per HPI otherwise 10 point review of systems negative.   Past Medical History:  Diagnosis Date   Asthma    Eczema    History of pulmonary embolus (PE)    Sickle cell anemia (HCC)     Past Surgical History:  Procedure Laterality Date   CHOLECYSTECTOMY     ERCP     JOINT REPLACEMENT     PORTA CATH INSERTION     TUBAL LIGATION     WISDOM TOOTH EXTRACTION      Social History:  reports that she has never smoked. She has never used smokeless tobacco. She reports that she does not drink alcohol and does not use drugs.   Allergies  Allergen Reactions   Cefaclor Hives, Swelling and Rash   Hydroxyurea Palpitations and Other (See Comments)    Lowers "blood levels" and heart rate (causes HYPOtension); "it messes me up, it drops my levels and stuff" and Hypotension    Omeprazole Other (See Comments)    Causes "sharp pains in the stomach"   Ketamine Palpitations and Other (See Comments)    "Pt states she has had previous reaction to ketamine. States she  becomes flushed, heart races, dizzy, and feels like she is going to pass out."    Family History  Problem Relation Age of Onset   Renal Disease Mother    Hypertension Mother    High Cholesterol Mother    Heart attack Mother    Diabetes Brother     Family history reviewed and not pertinent ***   Prior to Admission medications   Medication Sig Start Date End Date Taking? Authorizing Provider  acetaminophen (TYLENOL) 500 MG tablet Take 500 mg by mouth every 6 (six) hours as needed for mild pain or headache (or cramps).   Yes [provider]  albuterol (VENTOLIN HFA) 108 (90 Base) MCG/ACT inhaler TAKE 2 PUFFS BY MOUTH EVERY 6 HOURS AS NEEDED FOR WHEEZE OR SHORTNESS OF BREATH Patient taking differently: Inhale 2 puffs into the lungs every 6 (six) hours as needed for wheezing or shortness of breath. 01/22/23  Yes [name], NP  budesonide-formoterol (SYMBICORT) 80-4.5 MCG/ACT inhaler INHALE 2 PUFFS INTO THE LUNGS TWICE A DAY Patient taking differently: Inhale 2 puffs into the lungs 2 (two) times daily. 01/27/23  Yes [name], NP  Cholecalciferol (VITAMIN D3 PO) Take 1 capsule by mouth at bedtime.   Yes [provider]  Cyanocobalamin (VITAMIN B-12 PO) Take 1 tablet by mouth at bedtime.  Yes [provider]  Deferasirox (JADENU) 360 MG TABS Take 3 tablets (1,080 mg total) by mouth daily. 05/05/23  Yes [name], NP  diphenhydrAMINE (BENADRYL) 25 MG tablet Take 25 mg by mouth See admin instructions. Take 25 mg by mouth every four to six hours with each dose of Hydromorphone   Yes [provider]  fluticasone (FLONASE) 50 MCG/ACT nasal spray Place 1 spray into both nostrils daily as needed for allergies or rhinitis. 12/31/21 05/05/23 Yes [name], NP  folic acid (FOLVITE) 1 MG tablet Take 1 tablet (1 mg total) by mouth daily. 12/23/22  Yes [name], NP  HYDROmorphone (DILAUDID) 4 MG tablet Take 1 tablet (4 mg total) by mouth as  directed. Start date 04/29/23 05/08/23  Yes [name], NP  mirtazapine (REMERON) 45 MG tablet Take 1 tablet (45 mg total) by mouth at bedtime. 12/23/22 12/23/23 Yes [name], NP  naloxone [facility]) nasal spray 4 mg/0.1 mL Place 0.1 sprays (0.4 mg total) into the nose once as needed (opioid overdose). 12/23/22  Yes [name], NP  polyethylene glycol (MIRALAX / GLYCOLAX) 17 g packet Take 17 g by mouth daily as needed for mild constipation (MIX AS DIRECTED AND DRINK). 12/23/22  Yes [name], NP  promethazine (PHENERGAN) 25 MG tablet Take 0.5-1 tablets (12.5-25 mg total) by mouth every 6 (six) hours as needed for nausea or vomiting. 10/31/21 05/05/23 Yes [name], NP  Vitamin D, Ergocalciferol, (DRISDOL) 1.25 MG (50000 UNIT) CAPS capsule Take 1 capsule (50,000 Units total) by mouth once a week. Patient taking differently: Take 50,000 Units by mouth every Wednesday. 12/23/22 12/23/23 Yes [name], NP  XARELTO 20 MG TABS tablet Take 1 tablet (20 mg total) by mouth daily with supper. Patient taking differently: Take 20 mg by mouth at bedtime. 10/29/21 05/05/23 Yes [name], NP     Objective    Physical Exam: Vitals:   05/05/23 2100 05/05/23 2130 05/05/23 2230 05/05/23 2235  BP: 102/73 (!) 93/55 112/82   Pulse: 85 79 95   Resp:  16 16   Temp:    98.1 F (36.7 C)  TempSrc:      SpO2: 100% 95% 99%     General: appears to be stated age; alert, oriented Skin: warm, dry, no rash Head:  AT/[place] Mouth:  Oral mucosa membranes appear moist, normal dentition Neck: supple; trachea midline Heart:  RRR; did not appreciate any M/R/G Lungs: CTAB, did not appreciate any wheezes, rales, or rhonchi Abdomen: + BS; soft, ND, NT Vascular: 2+ pedal pulses b/l; 2+ radial pulses b/l Extremities: no peripheral edema, no muscle wasting Neuro: strength and sensation intact in upper and lower extremities b/l    *** Neuro: 5/5 strength of the proximal and distal flexors and  extensors of the upper and lower extremities bilaterally; sensation intact in upper and lower extremities b/l; cranial nerves II through XII grossly intact; no pronator drift; no evidence suggestive of slurred speech, dysarthria, or facial droop; Normal muscle tone. No tremors. *** Neuro: In the setting of the patient's current mental status and associated inability to follow instructions, unable to perform full neurologic exam at this time.  As such, assessment of strength, sensation, and cranial nerves is limited at this time. Patient noted to spontaneously move all 4 extremities. No tremors.  ***    Labs on Admission: I have personally reviewed following labs and imaging studies  CBC: Recent Labs  Lab  05/05/23 1145 05/05/23 2108  WBC 17.2* 15.5*  NEUTROABS 11.9* 9.0*  HGB 8.3* 8.0*  HCT 24.0* 22.5*  MCV 94.1 92.2  PLT 510* 478*   Basic Metabolic Panel: Recent Labs  Lab 05/05/23 1145 05/05/23 2108  NA 137 136  K 3.9 3.8  CL 105 109  CO2 20* 21*  GLUCOSE 92 97  BUN 10 10  CREATININE 0.52 0.61  CALCIUM 8.7* 8.6*   GFR: Estimated Creatinine Clearance: 76.6 mL/min (by C-G formula based on SCr of 0.61 mg/dL). Liver Function Tests: Recent Labs  Lab 05/05/23 1145 05/05/23 2108  AST 34 33  ALT 19 19  ALKPHOS 59 53  BILITOT 2.2* 2.3*  PROT 7.8 7.7  ALBUMIN 4.0 4.1   No results for input(s): "LIPASE", "AMYLASE" in the last 168 hours. No results for input(s): "AMMONIA" in the last 168 hours. Coagulation Profile: No results for input(s): "INR", "PROTIME" in the last 168 hours. Cardiac Enzymes: No results for input(s): "CKTOTAL", "CKMB", "CKMBINDEX", "TROPONINI" in the last 168 hours. BNP (last 3 results) No results for input(s): "PROBNP" in the last 8760 hours. HbA1C: No results for input(s): "HGBA1C" in the last 72 hours. CBG: No results for input(s): "GLUCAP" in the last 168 hours. Lipid Profile: No results for input(s): "CHOL", "HDL", "LDLCALC", "TRIG",  "CHOLHDL", "LDLDIRECT" in the last 72 hours. Thyroid Function Tests: No results for input(s): "TSH", "T4TOTAL", "FREET4", "T3FREE", "THYROIDAB" in the last 72 hours. Anemia Panel: Recent Labs    05/05/23 1145 05/05/23 2108  RETICCTPCT 19.0* 16.3*   Urine analysis:    Component Value Date/Time   COLORURINE YELLOW 04/24/2023 0412   APPEARANCEUR CLEAR 04/24/2023 0412   LABSPEC 1.005 04/24/2023 0412   PHURINE 5.0 04/24/2023 0412   GLUCOSEU NEGATIVE 04/24/2023 0412   HGBUR SMALL (A) 04/24/2023 0412   BILIRUBINUR NEGATIVE 04/24/2023 0412   BILIRUBINUR neg 08/03/2020 1429   KETONESUR NEGATIVE 04/24/2023 0412   PROTEINUR NEGATIVE 04/24/2023 0412   UROBILINOGEN 1.0 08/03/2020 1429   NITRITE NEGATIVE 04/24/2023 0412   LEUKOCYTESUR SMALL (A) 04/24/2023 0412    Radiological Exams on Admission: No results found.    Assessment/Plan    Principal Problem:   Sickle cell pain crisis (HCC)  ***              ***                ***               ***               ***               ***              ***               ***               ***               ***               ***               ***               ***              ***     DVT prophylaxis: SCD's ***  Code Status: Full code*** Family Communication: none*** Disposition Plan: Per Rounding Team Consults called: none***;  Admission status: ***  I SPENT GREATER THAN 75 *** MINUTES IN CLINICAL CARE TIME/MEDICAL DECISION-MAKING IN COMPLETING THIS ADMISSION.     Chaney Born Blandina Renaldo DO [facility] From 7PM - 7AM   05/05/2023, 11:58 PM   ***

## 2023-05-05 NOTE — ED Notes (Signed)
Not able to obtain labs.

## 2023-05-05 NOTE — ED Notes (Signed)
Unable to collect labs patient wants to wait until they get in a room in the back

## 2023-05-05 NOTE — ED Provider Notes (Signed)
[facility] AT [facility] Provider Note   CSN: [id number] Arrival date & time: 05/05/23  1646     History  Chief Complaint  Patient presents with   Sickle Cell Pain Crisis    Molly Gutierrez is a 42 y.o. female.  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  Pt lives in [place], but comes up here because she likes it better.  She has had several admissions here and in [place] for intractable pain.  [facility]  She said her pain only went down to a 7 and she thought she needed to be admitted.  She left there and came here.  She said her pain is in her legs.  She denies fevers or any other sx.       Home Medications Prior to Admission medications   Medication Sig Start Date End Date Taking? Authorizing Provider  acetaminophen (TYLENOL) 500 MG tablet Take 500 mg by mouth every 6 (six) hours as needed for mild pain or headache (or cramps).   Yes [provider]  albuterol (VENTOLIN HFA) 108 (90 Base) MCG/ACT inhaler TAKE 2 PUFFS BY MOUTH EVERY 6 HOURS AS NEEDED FOR WHEEZE OR SHORTNESS OF BREATH Patient taking differently: Inhale 2 puffs into the lungs every 6 (six) hours as needed for wheezing or shortness of breath. 01/22/23  Yes [name], NP  budesonide-formoterol (SYMBICORT) 80-4.5 MCG/ACT inhaler INHALE 2 PUFFS INTO THE LUNGS TWICE A DAY Patient taking differently: Inhale 2 puffs into the lungs 2 (two) times daily. 01/27/23  Yes [name], NP  Cholecalciferol (VITAMIN D3 PO) Take 1 capsule by mouth at bedtime.   Yes [provider]  Cyanocobalamin (VITAMIN B-12 PO) Take 1 tablet by mouth at bedtime.   Yes [provider]  Deferasirox (JADENU) 360 MG TABS Take 3 tablets (1,080 mg total) by mouth daily. 05/05/23  Yes [name], NP  diphenhydrAMINE (BENADRYL) 25 MG tablet Take 25 mg by mouth See  admin instructions. Take 25 mg by mouth every four to six hours with each dose of Hydromorphone   Yes [provider]  fluticasone (FLONASE) 50 MCG/ACT nasal spray Place 1 spray into both nostrils daily as needed for allergies or rhinitis. 12/31/21 05/05/23 Yes [name], NP  folic acid (FOLVITE) 1 MG tablet Take 1 tablet (1 mg total) by mouth daily. 12/23/22  Yes [name], NP  HYDROmorphone (DILAUDID) 4 MG tablet Take 1 tablet (4 mg total) by mouth as directed. Start date 04/29/23 05/08/23  Yes [name], NP  mirtazapine (REMERON) 45 MG tablet Take 1 tablet (45 mg total) by mouth at bedtime. 12/23/22 12/23/23 Yes [name], NP  naloxone [facility]) nasal spray 4 mg/0.1 mL Place 0.1 sprays (0.4 mg total) into the nose once as needed (opioid overdose). 12/23/22  Yes [name], NP  polyethylene glycol (MIRALAX / GLYCOLAX) 17 g packet Take 17 g by mouth daily as needed for mild constipation (MIX AS DIRECTED AND DRINK). 12/23/22  Yes [name], NP  promethazine (PHENERGAN) 25 MG tablet Take 0.5-1 tablets (12.5-25 mg total) by mouth every 6 (six) hours as needed for nausea or vomiting. 10/31/21 05/05/23 Yes [name], NP  Vitamin D, Ergocalciferol, (DRISDOL) 1.25 MG (50000 UNIT) CAPS capsule Take 1 capsule (50,000 Units total) by mouth once a week.  Patient taking differently: Take 50,000 Units by mouth every Wednesday. 12/23/22 12/23/23 Yes [name], NP  XARELTO 20 MG TABS tablet Take 1 tablet (20 mg total) by mouth daily with supper. Patient taking differently: Take 20 mg by mouth at bedtime. 10/29/21 05/05/23 Yes [name], NP      Allergies    Cefaclor, Hydroxyurea, Omeprazole, and Ketamine    Review of Systems   Review of Systems  Musculoskeletal:        BLE pain  All other systems reviewed and are negative.   Physical Exam Updated Vital Signs BP 112/82   Pulse 95   Temp 98.1 F (36.7 C)   Resp 16   LMP 04/14/2023   SpO2 99%   Physical Exam Vitals and nursing note reviewed.  Constitutional:      Appearance: Normal appearance.  HENT:     Head: Normocephalic and atraumatic.     Right Ear: External ear normal.     Left Ear: External ear normal.     Nose: Nose normal.     Mouth/Throat:     Mouth: Mucous membranes are moist.     Pharynx: Oropharynx is clear.  Eyes:     Extraocular Movements: Extraocular movements intact.     Conjunctiva/sclera: Conjunctivae normal.     Pupils: Pupils are equal, round, and reactive to light.  Cardiovascular:     Rate and Rhythm: Normal rate and regular rhythm.     Pulses: Normal pulses.     Heart sounds: Normal heart sounds.  Pulmonary:     Effort: Pulmonary effort is normal.     Breath sounds: Normal breath sounds.  Abdominal:     General: Abdomen is flat. Bowel sounds are normal.     Palpations: Abdomen is soft.  Musculoskeletal:        General: Normal range of motion.     Cervical back: Normal range of motion and neck supple.  Skin:    General: Skin is warm.     Capillary Refill: Capillary refill takes less than 2 seconds.  Neurological:     General: No focal deficit present.     Mental Status: She is alert and oriented to person, place, and time.  Psychiatric:        Mood and Affect: Mood normal.        Behavior: Behavior normal.     ED Results / Procedures / Treatments   Labs (all labs ordered are listed, but only abnormal results are displayed) Labs Reviewed  CBC WITH DIFFERENTIAL/PLATELET - Abnormal; Notable for the following components:      Result Value   WBC 15.5 (*)    RBC 2.44 (*)    Hemoglobin 8.0 (*)    HCT 22.5 (*)    RDW 20.3 (*)    Platelets 478 (*)    nRBC 0.5 (*)    Neutro Abs 9.0 (*)    Monocytes Absolute 2.0 (*)    Abs Immature Granulocytes 0.08 (*)    All other components within normal limits  COMPREHENSIVE METABOLIC PANEL - Abnormal; Notable for the following components:   CO2 21 (*)    Calcium 8.6 (*)    Total Bilirubin 2.3  (*)    All other components within normal limits  RETICULOCYTES - Abnormal; Notable for the following components:   Retic Ct Pct 16.3 (*)    RBC. 2.45 (*)    Retic Count, Absolute 399.0 (*)    Immature Retic Fract 18.4 (*)  All other components within normal limits  HCG, QUANTITATIVE, PREGNANCY    EKG None  Radiology No results found.  Procedures Procedures    Medications Ordered in ED Medications  ondansetron (ZOFRAN) injection 4 mg (has no administration in time range)  ketorolac (TORADOL) 15 MG/ML injection 15 mg (15 mg Intravenous Given 05/05/23 2119)  diphenhydrAMINE (BENADRYL) 12.5 mg in sodium chloride 0.9 % 50 mL IVPB (0 mg Intravenous Stopped 05/05/23 2228)  HYDROmorphone (DILAUDID) injection 2 mg (2 mg Intravenous Given 05/05/23 2119)  HYDROmorphone (DILAUDID) injection 2 mg (2 mg Intravenous Given 05/05/23 2153)  HYDROmorphone (DILAUDID) injection 2 mg (2 mg Intravenous Given 05/05/23 2235)    ED Course/ Medical Decision Making/ A&P                                 Medical Decision Making Amount and/or Complexity of Data Reviewed Labs: ordered.  Risk Prescription drug management. Decision regarding hospitalization.   This patient presents to the ED for concern of pain, this involves an extensive number of treatment options, and is a complaint that carries with it a high risk of complications and morbidity.  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  The pertinent results include:  cbc with wbc 15.5 (stable), hgb 8.0 (stable); retic 16.3, cmp nl, preg neg   Cardiac Monitoring:  The patient was maintained on a cardiac monitor.  I personally viewed and interpreted the  cardiac monitored which showed an underlying rhythm of: nsr   Medicines ordered and prescription drug management:  I ordered medication including dilaudid  for pain  Reevaluation of the patient after these medicines showed that the patient improved I have reviewed the patients home medicines and have made adjustments as needed   Consultations Obtained:  I requested consultation with the hospitalist (Dr. Arlean Hopping),  and discussed lab and imaging findings as well as pertinent plan - he will admit   Problem List / ED Course:  Sickle cell pain crisis:  pain has not improved enough to go home   Reevaluation:  After the interventions noted above, I reevaluated the patient and found that they have :improved   Social Determinants of Health:  Lives at home   Dispostion:  After consideration of the diagnostic results and the patients response to treatment, I feel that the patent would benefit from admission.          Final Clinical Impression(s) / ED Diagnoses Final diagnoses:  Sickle cell pain crisis [facility])    Rx / DC Orders ED Discharge Orders     None         Jacalyn Lefevre, MD 05/05/23 2354

## 2023-05-05 NOTE — ED Provider Triage Note (Addendum)
Emergency Medicine Provider Triage Evaluation Note  [facility] , a 42 y.o. female  was evaluated in triage.  Pt complains of sickle cell pain.  Review of Systems  Positive:  Negative:   Physical Exam  BP 98/63 (BP Location: Left Arm)   Pulse 93   Temp 98.9 F (37.2 C) (Oral)   Resp 15   LMP 04/14/2023   SpO2 96%  Gen:   Awake, no distress   Resp:  Normal effort  MSK:   Moves extremities without difficulty  Other:    Medical Decision Making  Medically screening exam initiated at 5:20 PM.  Appropriate orders placed.  [facility] was informed that the remainder of the evaluation will be completed by another provider, this initial triage assessment does not replace that evaluation, and the importance of remaining in the ED until their evaluation is complete.  Concerned for sickle cell pain. Pain is located in BL legs that started 3 days ago. Home medication not helping. Patient was able to go to the sickle cell clinic but states that she was told that the sickle cell clinic no longer does direct admit. Provider at clinic referred patient to ED since their pain was still not controlled. No recent trauma.   Denies fever, chest pain, dyspnea, cough, dysuria, hematuria.  +2 pedal pulse. LE ROM intact. No swelling appreciated.  Labs obtained at the sickle cell clinic today.     Dorthy Cooler, [place] 05/05/23 1725

## 2023-05-06 ENCOUNTER — Encounter (HOSPITAL_COMMUNITY): Payer: Self-pay | Admitting: Internal Medicine

## 2023-05-06 ENCOUNTER — Observation Stay (HOSPITAL_COMMUNITY): Payer: Medicare HMO

## 2023-05-06 ENCOUNTER — Other Ambulatory Visit (HOSPITAL_COMMUNITY): Payer: Self-pay

## 2023-05-06 DIAGNOSIS — Z83438 Family history of other disorder of lipoprotein metabolism and other lipidemia: Secondary | ICD-10-CM | POA: Diagnosis not present

## 2023-05-06 DIAGNOSIS — G894 Chronic pain syndrome: Secondary | ICD-10-CM | POA: Diagnosis present

## 2023-05-06 DIAGNOSIS — Z7951 Long term (current) use of inhaled steroids: Secondary | ICD-10-CM | POA: Diagnosis not present

## 2023-05-06 DIAGNOSIS — J4540 Moderate persistent asthma, uncomplicated: Secondary | ICD-10-CM | POA: Diagnosis present

## 2023-05-06 DIAGNOSIS — Z881 Allergy status to other antibiotic agents status: Secondary | ICD-10-CM | POA: Diagnosis not present

## 2023-05-06 DIAGNOSIS — Z841 Family history of disorders of kidney and ureter: Secondary | ICD-10-CM | POA: Diagnosis not present

## 2023-05-06 DIAGNOSIS — Z888 Allergy status to other drugs, medicaments and biological substances status: Secondary | ICD-10-CM | POA: Diagnosis not present

## 2023-05-06 DIAGNOSIS — Z833 Family history of diabetes mellitus: Secondary | ICD-10-CM | POA: Diagnosis not present

## 2023-05-06 DIAGNOSIS — D5700 Hb-SS disease with crisis, unspecified: Principal | ICD-10-CM

## 2023-05-06 DIAGNOSIS — R6510 Systemic inflammatory response syndrome (SIRS) of non-infectious origin without acute organ dysfunction: Secondary | ICD-10-CM | POA: Diagnosis not present

## 2023-05-06 DIAGNOSIS — Z86711 Personal history of pulmonary embolism: Secondary | ICD-10-CM | POA: Diagnosis not present

## 2023-05-06 DIAGNOSIS — Z8249 Family history of ischemic heart disease and other diseases of the circulatory system: Secondary | ICD-10-CM | POA: Diagnosis not present

## 2023-05-06 DIAGNOSIS — F1120 Opioid dependence, uncomplicated: Secondary | ICD-10-CM | POA: Diagnosis present

## 2023-05-06 DIAGNOSIS — Z884 Allergy status to anesthetic agent status: Secondary | ICD-10-CM | POA: Diagnosis not present

## 2023-05-06 DIAGNOSIS — Z7901 Long term (current) use of anticoagulants: Secondary | ICD-10-CM | POA: Diagnosis not present

## 2023-05-06 DIAGNOSIS — D72829 Elevated white blood cell count, unspecified: Secondary | ICD-10-CM | POA: Diagnosis present

## 2023-05-06 DIAGNOSIS — Z79899 Other long term (current) drug therapy: Secondary | ICD-10-CM | POA: Diagnosis not present

## 2023-05-06 LAB — CBC WITH DIFFERENTIAL/PLATELET
Abs Immature Granulocytes: 0.08 10*3/uL — ABNORMAL HIGH (ref 0.00–0.07)
Basophils Absolute: 0.1 10*3/uL (ref 0.0–0.1)
Basophils Relative: 0 %
Eosinophils Absolute: 0.3 10*3/uL (ref 0.0–0.5)
Eosinophils Relative: 2 %
HCT: 20.4 % — ABNORMAL LOW (ref 36.0–46.0)
Hemoglobin: 7.4 g/dL — ABNORMAL LOW (ref 12.0–15.0)
Immature Granulocytes: 1 %
Lymphocytes Relative: 25 %
Lymphs Abs: 3.8 10*3/uL (ref 0.7–4.0)
MCH: 33.6 pg (ref 26.0–34.0)
MCHC: 36.3 g/dL — ABNORMAL HIGH (ref 30.0–36.0)
MCV: 92.7 fL (ref 80.0–100.0)
Monocytes Absolute: 2.1 10*3/uL — ABNORMAL HIGH (ref 0.1–1.0)
Monocytes Relative: 14 %
Neutro Abs: 9 10*3/uL — ABNORMAL HIGH (ref 1.7–7.7)
Neutrophils Relative %: 58 %
Platelets: 459 10*3/uL — ABNORMAL HIGH (ref 150–400)
RBC: 2.2 MIL/uL — ABNORMAL LOW (ref 3.87–5.11)
RDW: 20.2 % — ABNORMAL HIGH (ref 11.5–15.5)
WBC: 15.3 10*3/uL — ABNORMAL HIGH (ref 4.0–10.5)
nRBC: 0.3 % — ABNORMAL HIGH (ref 0.0–0.2)

## 2023-05-06 LAB — URINALYSIS, COMPLETE (UACMP) WITH MICROSCOPIC
Bacteria, UA: NONE SEEN
Bilirubin Urine: NEGATIVE
Glucose, UA: NEGATIVE mg/dL
Hgb urine dipstick: NEGATIVE
Ketones, ur: NEGATIVE mg/dL
Nitrite: NEGATIVE
Protein, ur: NEGATIVE mg/dL
Specific Gravity, Urine: 1.014 (ref 1.005–1.030)
pH: 5 (ref 5.0–8.0)

## 2023-05-06 LAB — RETICULOCYTES
Immature Retic Fract: 9.4 % (ref 2.3–15.9)
RBC.: 2.21 MIL/uL — ABNORMAL LOW (ref 3.87–5.11)
Retic Count, Absolute: 380 10*3/uL — ABNORMAL HIGH (ref 19.0–186.0)
Retic Ct Pct: 16.5 % — ABNORMAL HIGH (ref 0.4–3.1)

## 2023-05-06 LAB — COMPREHENSIVE METABOLIC PANEL
ALT: 17 U/L (ref 0–44)
AST: 32 U/L (ref 15–41)
Albumin: 3.9 g/dL (ref 3.5–5.0)
Alkaline Phosphatase: 52 U/L (ref 38–126)
Anion gap: 8 (ref 5–15)
BUN: 12 mg/dL (ref 6–20)
CO2: 21 mmol/L — ABNORMAL LOW (ref 22–32)
Calcium: 8 mg/dL — ABNORMAL LOW (ref 8.9–10.3)
Chloride: 106 mmol/L (ref 98–111)
Creatinine, Ser: 0.64 mg/dL (ref 0.44–1.00)
GFR, Estimated: >60 mL/min (ref >60)
Glucose, Bld: 100 mg/dL — ABNORMAL HIGH (ref 70–99)
Potassium: 4 mmol/L (ref 3.5–5.1)
Sodium: 135 mmol/L (ref 135–145)
Total Bilirubin: 1.9 mg/dL — ABNORMAL HIGH (ref <1.2)
Total Protein: 6.9 g/dL (ref 6.5–8.1)

## 2023-05-06 LAB — RAPID URINE DRUG SCREEN, HOSP PERFORMED
Amphetamines: NOT DETECTED
Barbiturates: NOT DETECTED
Benzodiazepines: NOT DETECTED
Cocaine: NOT DETECTED
Opiates: POSITIVE — AB
Tetrahydrocannabinol: NOT DETECTED

## 2023-05-06 LAB — APTT: aPTT: 38 s — ABNORMAL HIGH (ref 24–36)

## 2023-05-06 LAB — MAGNESIUM: Magnesium: 2.3 mg/dL (ref 1.7–2.4)

## 2023-05-06 LAB — PROTIME-INR
INR: 2.1 — ABNORMAL HIGH (ref 0.8–1.2)
Prothrombin Time: 24.2 s — ABNORMAL HIGH (ref 11.4–15.2)

## 2023-05-06 MED ORDER — ACETAMINOPHEN 325 MG PO TABS: 650.0000 mg | ORAL_TABLET | Freq: Four times a day (QID) | ORAL | Status: DC | PRN

## 2023-05-06 MED ORDER — KETOROLAC TROMETHAMINE 15 MG/ML IJ SOLN
15.0000 mg | Freq: Four times a day (QID) | INTRAMUSCULAR | Status: DC
  Administered 2023-05-06 – 2023-05-09 (×12): 15 mg via INTRAVENOUS
  Filled 2023-05-06 (×12): qty 1

## 2023-05-06 MED ORDER — SODIUM CHLORIDE 0.45 % IV SOLN: INTRAVENOUS | Status: AC

## 2023-05-06 MED ORDER — ACETAMINOPHEN 650 MG RE SUPP: 650.0000 mg | Freq: Four times a day (QID) | RECTAL | Status: DC | PRN

## 2023-05-06 MED ORDER — HYDROMORPHONE 1 MG/ML IV SOLN
INTRAVENOUS | Status: DC
Start: 2023-05-06 — End: 2023-05-09
  Administered 2023-05-06: 30 mg via INTRAVENOUS
  Administered 2023-05-06: 7 mg via INTRAVENOUS
  Administered 2023-05-07: 5.5 mg via INTRAVENOUS
  Administered 2023-05-07: 4 mg via INTRAVENOUS
  Administered 2023-05-07: 6 mg via INTRAVENOUS
  Administered 2023-05-07: 2.5 mg via INTRAVENOUS
  Administered 2023-05-07: 30 mg via INTRAVENOUS
  Administered 2023-05-07: 6 mg via INTRAVENOUS
  Administered 2023-05-07: 9.5 mg via INTRAVENOUS
  Administered 2023-05-08: 3 mg via INTRAVENOUS
  Administered 2023-05-08: 6.5 mg via INTRAVENOUS
  Administered 2023-05-08: 4 mg via INTRAVENOUS
  Administered 2023-05-08: 30 mg via INTRAVENOUS
  Administered 2023-05-08: 2 mg via INTRAVENOUS
  Administered 2023-05-08: 3.5 mg via INTRAVENOUS
  Administered 2023-05-09: 4 mg via INTRAVENOUS
  Administered 2023-05-09: 5 mg via INTRAVENOUS
  Administered 2023-05-09: 3.5 mg via INTRAVENOUS
  Administered 2023-05-09: 5.5 mg via INTRAVENOUS
  Filled 2023-05-06 (×3): qty 30

## 2023-05-06 MED ORDER — POLYETHYLENE GLYCOL 3350 17 G PO PACK: 17.0000 g | PACK | Freq: Every day | ORAL | Status: DC | PRN

## 2023-05-06 MED ORDER — MELATONIN 3 MG PO TABS: 3.0000 mg | ORAL_TABLET | Freq: Every evening | ORAL | Status: DC | PRN

## 2023-05-06 MED ORDER — HYDROMORPHONE 1 MG/ML IV SOLN: INTRAVENOUS | Status: DC

## 2023-05-06 MED ORDER — KETOROLAC TROMETHAMINE 15 MG/ML IJ SOLN
15.0000 mg | Freq: Four times a day (QID) | INTRAMUSCULAR | Status: DC | PRN
  Administered 2023-05-06 (×2): 15 mg via INTRAVENOUS
  Filled 2023-05-06 (×2): qty 1

## 2023-05-06 MED ORDER — DEFERASIROX 360 MG PO TABS: 1080.0000 mg | ORAL_TABLET | Freq: Every day | ORAL | Status: DC

## 2023-05-06 MED ORDER — ALBUTEROL SULFATE (2.5 MG/3ML) 0.083% IN NEBU: 2.5000 mg | INHALATION_SOLUTION | RESPIRATORY_TRACT | Status: DC | PRN

## 2023-05-06 MED ORDER — CHLORHEXIDINE GLUCONATE CLOTH 2 % EX PADS
6.0000 | MEDICATED_PAD | Freq: Every day | CUTANEOUS | Status: DC
  Administered 2023-05-06 – 2023-05-09 (×4): 6 via TOPICAL

## 2023-05-06 MED ORDER — RIVAROXABAN 20 MG PO TABS
20.0000 mg | ORAL_TABLET | Freq: Every day | ORAL | Status: DC
  Administered 2023-05-06 – 2023-05-08 (×4): 20 mg via ORAL
  Filled 2023-05-06 (×4): qty 1

## 2023-05-06 MED ORDER — HYDROMORPHONE HCL 2 MG/ML IJ SOLN: 2.0000 mg | INTRAMUSCULAR | Status: DC | PRN

## 2023-05-06 MED ORDER — DIPHENHYDRAMINE HCL 25 MG PO CAPS
25.0000 mg | ORAL_CAPSULE | ORAL | Status: DC | PRN
Start: 2023-05-06 — End: 2023-05-09
  Administered 2023-05-06 (×3): 25 mg via ORAL
  Filled 2023-05-06 (×3): qty 1

## 2023-05-06 MED ORDER — MOMETASONE FURO-FORMOTEROL FUM 100-5 MCG/ACT IN AERO
2.0000 | INHALATION_SPRAY | Freq: Two times a day (BID) | RESPIRATORY_TRACT | Status: DC
  Administered 2023-05-06 – 2023-05-08 (×4): 2 via RESPIRATORY_TRACT
  Filled 2023-05-06: qty 8.8

## 2023-05-06 MED ORDER — DOCUSATE SODIUM 100 MG PO CAPS
100.0000 mg | ORAL_CAPSULE | Freq: Two times a day (BID) | ORAL | Status: DC
  Administered 2023-05-06 – 2023-05-09 (×8): 100 mg via ORAL
  Filled 2023-05-06 (×8): qty 1

## 2023-05-06 NOTE — Plan of Care (Signed)

## 2023-05-06 NOTE — ED Notes (Signed)
ED TO INPATIENT HANDOFF REPORT  Name/Age/Gender [facility] 42 y.o. female  Code Status    Code Status Orders  (From admission, onward)           Start     Ordered   05/06/23 0014  Full code  Continuous       Question:  By:  Answer:  Consent: discussion documented in EHR   05/06/23 0013           Code Status History     Date Active Date Inactive Code Status Order ID Comments User Context   05/05/2023 1058 05/05/2023 1646 Full Code [id number]  Massie Maroon, FNP Inpatient   04/23/2023 0152 04/27/2023 2320 Full Code [id number]  Darlin Drop, DO ED   03/04/2023 1421 03/09/2023 2155 Full Code [id number]  Massie Maroon, FNP ED   12/24/2022 1138 12/24/2022 2143 Full Code [id number]  Massie Maroon, FNP Inpatient   12/09/2022 1617 12/13/2022 1639 Full Code [id number]  Massie Maroon, FNP Inpatient   12/09/2022 1049 12/09/2022 1617 Full Code [id number]  Massie Maroon, FNP Inpatient   04/02/2022 1502 04/04/2022 1900 Full Code [id number]  Massie Maroon, FNP Inpatient   02/05/2022 0505 02/09/2022 1857 Full Code [id number]  John Giovanni, MD Inpatient   12/12/2021 0449 12/17/2021 1823 Full Code [id number]  John Giovanni, MD Inpatient   11/12/2021 0121 11/15/2021 2149 Full Code [id number]  Hillary Bow, DO ED   10/04/2021 2128 10/08/2021 2133 Full Code [id number]  Marinda Elk, MD ED   06/03/2021 0246 06/08/2021 0105 Full Code [id number]  Howerter, Justin B, DO ED   06/03/2021 0153 06/03/2021 0246 Full Code [id number]  Howerter, Justin B, DO ED   04/25/2021 0635 04/28/2021 2052 Full Code [id number]  Marinda Elk, MD ED   03/17/2021 1617 03/21/2021 1935 Full Code [id number]  Massie Maroon, FNP Inpatient   02/17/2021 2224 02/20/2021 2113 Full Code [id number]  Howerter, Justin B, DO ED   02/17/2021 2217 02/17/2021 2224 Full Code [id number]  Howerter, Justin B, DO ED   01/04/2021 0747 01/08/2021 0011 Full Code [id number]  Margie Ege A, DO ED   12/08/2020 0303 12/11/2020 2115  Full Code [id number]  Hillary Bow, DO ED   11/21/2020 1905 11/26/2020 2208 Full Code [id number]  Merlene Laughter, DO ED   11/07/2020 2108 11/13/2020 2102 Full Code [id number]  Rometta Emery, MD ED   10/05/2020 2218 10/10/2020 2116 Full Code [id number]  Rometta Emery, MD Inpatient   08/23/2020 1205 08/23/2020 2225 Full Code [id number]  Massie Maroon, FNP Inpatient   04/24/2020 2244 04/28/2020 2359 Full Code [id number]  Darlin Drop, DO ED   04/21/2020 1042 04/21/2020 2145 Full Code [id number]  Massie Maroon, FNP Inpatient   03/31/2020 1625 04/04/2020 2311 Full Code [id number]  Massie Maroon, FNP Inpatient   03/31/2020 1048 03/31/2020 1625 Full Code [id number]  Massie Maroon, FNP Inpatient   02/29/2020 1808 03/05/2020 2311 Full Code [id number]  Massie Maroon, FNP Inpatient   01/07/2020 1339 01/12/2020 2253 Full Code [id number]  Quentin Angst, MD Inpatient   07/29/2019 0312 08/04/2019 1845 Full Code [id number]  Thalia Party, DO ED   06/27/2019 0602 07/02/2019 2229 Full Code [id number]  Liborio Nixon, MD ED   05/26/2019 1646 06/01/2019 2241 Full Code [id number]  Massie Maroon, FNP Inpatient   05/05/2019 0116 05/15/2019 2110 Full Code [id number]  John Giovanni, MD Inpatient   04/19/2019 1144 04/26/2019 2031 Full Code  [id number]  Massie Maroon, FNP Inpatient       Home/SNF/Other Home  Chief Complaint Sickle cell pain crisis [facility]) [D57.00]  Level of Care/Admitting Diagnosis ED Disposition     ED Disposition  Admit   Condition  --   Comment  Hospital Area: [facility] [[id number]]  Level of Care: Telemetry [5]  Admit to tele based on following criteria: Monitor for Ischemic changes  May place patient in observation at [facility] or [facility] if equivalent level of care is available:: No  Covid Evaluation: Asymptomatic - no recent exposure (last 10 days) testing not required  Diagnosis: Sickle cell pain crisis [facility]) [[id number]]  Admitting  Physician: Angie Fava [[id number]]  Attending Physician: Angie Fava [[id number]]          Medical History Past Medical History:  Diagnosis Date   Asthma    Eczema    History of pulmonary embolus (PE)    Sickle cell anemia (HCC)     Allergies Allergies  Allergen Reactions   Cefaclor Hives, Swelling and Rash   Hydroxyurea Palpitations and Other (See Comments)    Lowers "blood levels" and heart rate (causes HYPOtension); "it messes me up, it drops my levels and stuff" and Hypotension    Omeprazole Other (See Comments)    Causes "sharp pains in the stomach"   Ketamine Palpitations and Other (See Comments)    "Pt states she has had previous reaction to ketamine. States she becomes flushed, heart races, dizzy, and feels like she is going to pass out."    IV Location/Drains/Wounds Patient Lines/Drains/Airways Status     Active Line/Drains/Airways     Name Placement date Placement time Site Days   Implanted Port 01/06/20 01/06/20  0542  --  1216   Implanted Port 10/06/10 Right Chest 10/06/10  1900  Chest  4595            Labs/Imaging Results for orders placed or performed during the hospital encounter of 05/05/23 (from the past 48 hour(s))  hCG, quantitative, pregnancy     Status: None   Collection Time: 05/05/23  9:07 PM  Result Value Ref Range   hCG, Beta Chain, Quant, S <1 <5 mIU/mL    Comment:          GEST. AGE      CONC.  (mIU/mL)   <=1 WEEK        5 - 50     2 WEEKS       50 - 500     3 WEEKS       100 - 10,000     4 WEEKS     1,000 - 30,000     5 WEEKS     3,500 - 115,000   6-8 WEEKS     12,000 - 270,000    12 WEEKS     15,000 - 220,000        FEMALE AND NON-PREGNANT FEMALE:     LESS THAN 5 mIU/mL Performed at [facility], [address] [place], [place] [address]   CBC with Differential     Status: Abnormal   Collection Time: 05/05/23  9:08 PM  Result Value Ref Range   WBC 15.5 (H) 4.0 - 10.5 K/uL   RBC 2.44 (L) 3.87  - 5.11 MIL/uL   Hemoglobin 8.0 (L) 12.0 - 15.0 g/dL   HCT 34.7 (L) 42.5 - 95.6 %   MCV 92.2 80.0 - 100.0 fL  MCH 32.8 26.0 - 34.0 pg   MCHC 35.6 30.0 - 36.0 g/dL   RDW 16.1 (H) 09.6 - 04.5 %   Platelets 478 (H) 150 - 400 K/uL   nRBC 0.5 (H) 0.0 - 0.2 %   Neutrophils Relative % 57 %   Neutro Abs 9.0 (H) 1.7 - 7.7 K/uL   Lymphocytes Relative 26 %   Lymphs Abs 4.0 0.7 - 4.0 K/uL   Monocytes Relative 13 %   Monocytes Absolute 2.0 (H) 0.1 - 1.0 K/uL   Eosinophils Relative 2 %   Eosinophils Absolute 0.3 0.0 - 0.5 K/uL   Basophils Relative 1 %   Basophils Absolute 0.1 0.0 - 0.1 K/uL   Immature Granulocytes 1 %   Abs Immature Granulocytes 0.08 (H) 0.00 - 0.07 K/uL    Comment: Performed at [facility], [address], [place], [place] [address]  Comprehensive metabolic panel     Status: Abnormal   Collection Time: 05/05/23  9:08 PM  Result Value Ref Range   Sodium 136 135 - 145 mmol/L   Potassium 3.8 3.5 - 5.1 mmol/L   Chloride 109 98 - 111 mmol/L   CO2 21 (L) 22 - 32 mmol/L   Glucose, Bld 97 70 - 99 mg/dL    Comment: Glucose reference range applies only to samples taken after fasting for at least 8 hours.   BUN 10 6 - 20 mg/dL   Creatinine, Ser 1.91 0.44 - 1.00 mg/dL   Calcium 8.6 (L) 8.9 - 10.3 mg/dL   Total Protein 7.7 6.5 - 8.1 g/dL   Albumin 4.1 3.5 - 5.0 g/dL   AST 33 15 - 41 U/L   ALT 19 0 - 44 U/L   Alkaline Phosphatase 53 38 - 126 U/L   Total Bilirubin 2.3 (H) <1.2 mg/dL   GFR, Estimated >47 >82 mL/min    Comment: (NOTE) Calculated using the CKD-EPI Creatinine Equation (2021)    Anion gap 6 5 - 15    Comment: Performed at [facility], [address] [place], [place] [address]  Reticulocytes     Status: Abnormal   Collection Time: 05/05/23  9:08 PM  Result Value Ref Range   Retic Ct Pct 16.3 (H) 0.4 - 3.1 %    Comment: RESULT CONFIRMED BY MANUAL DILUTION   RBC. 2.45 (L) 3.87 - 5.11 MIL/uL   Retic Count, Absolute 399.0 (H)  19.0 - 186.0 K/uL   Immature Retic Fract 18.4 (H) 2.3 - 15.9 %    Comment: Performed at [facility], [address], [place], [place] [address]  Rapid urine drug screen (hospital performed)     Status: Abnormal   Collection Time: 05/06/23 12:26 AM  Result Value Ref Range   Opiates POSITIVE (A) NONE DETECTED   Cocaine NONE DETECTED NONE DETECTED   Benzodiazepines NONE DETECTED NONE DETECTED   Amphetamines NONE DETECTED NONE DETECTED   Tetrahydrocannabinol NONE DETECTED NONE DETECTED   Barbiturates NONE DETECTED NONE DETECTED    Comment: (NOTE) DRUG SCREEN FOR MEDICAL PURPOSES ONLY.  IF CONFIRMATION IS NEEDED FOR ANY PURPOSE, NOTIFY LAB WITHIN 5 DAYS.  LOWEST DETECTABLE LIMITS FOR URINE DRUG SCREEN Drug Class                     Cutoff (ng/mL) Amphetamine and metabolites    1000 Barbiturate and metabolites    200 Benzodiazepine  200 Opiates and metabolites        300 Cocaine and metabolites        300 THC                            50 Performed at [facility], [address], [place], [place] [address]   Urinalysis, Complete w Microscopic -Urine, Clean Catch     Status: Abnormal   Collection Time: 05/06/23 12:26 AM  Result Value Ref Range   Color, Urine YELLOW YELLOW   APPearance CLEAR CLEAR   Specific Gravity, Urine 1.014 1.005 - 1.030   pH 5.0 5.0 - 8.0   Glucose, UA NEGATIVE NEGATIVE mg/dL   Hgb urine dipstick NEGATIVE NEGATIVE   Bilirubin Urine NEGATIVE NEGATIVE   Ketones, ur NEGATIVE NEGATIVE mg/dL   Protein, ur NEGATIVE NEGATIVE mg/dL   Nitrite NEGATIVE NEGATIVE   Leukocytes,Ua SMALL (A) NEGATIVE   RBC / HPF 0-5 0 - 5 RBC/hpf   WBC, UA 6-10 0 - 5 WBC/hpf   Bacteria, UA NONE SEEN NONE SEEN   Squamous Epithelial / HPF 0-5 0 - 5 /HPF   Mucus PRESENT    Hyaline Casts, UA PRESENT     Comment: Performed at [facility], [address], [place], [place] [address]  Type and screen [facility]     Status: None   Collection Time: 05/06/23  1:02 AM  Result Value Ref Range   ABO/RH(D) A POS    Antibody Screen NEG    Sample Expiration      05/09/2023,2359 Performed at [facility], [address] [place], [place] [address]   CBC with Differential/Platelet     Status: Abnormal   Collection Time: 05/06/23  5:32 AM  Result Value Ref Range   WBC 15.3 (H) 4.0 - 10.5 K/uL   RBC 2.20 (L) 3.87 - 5.11 MIL/uL   Hemoglobin 7.4 (L) 12.0 - 15.0 g/dL   HCT 46.9 (L) 62.9 - 52.8 %   MCV 92.7 80.0 - 100.0 fL   MCH 33.6 26.0 - 34.0 pg   MCHC 36.3 (H) 30.0 - 36.0 g/dL   RDW 41.3 (H) 24.4 - 01.0 %   Platelets 459 (H) 150 - 400 K/uL   nRBC 0.3 (H) 0.0 - 0.2 %   Neutrophils Relative % 58 %   Neutro Abs 9.0 (H) 1.7 - 7.7 K/uL   Lymphocytes Relative 25 %   Lymphs Abs 3.8 0.7 - 4.0 K/uL   Monocytes Relative 14 %   Monocytes Absolute 2.1 (H) 0.1 - 1.0 K/uL   Eosinophils Relative 2 %   Eosinophils Absolute 0.3 0.0 - 0.5 K/uL   Basophils Relative 0 %   Basophils Absolute 0.1 0.0 - 0.1 K/uL   Immature Granulocytes 1 %   Abs Immature Granulocytes 0.08 (H) 0.00 - 0.07 K/uL    Comment: Performed at [facility], [address] [place], [place] [address]  Comprehensive metabolic panel     Status: Abnormal   Collection Time: 05/06/23  5:32 AM  Result Value Ref Range   Sodium 135 135 - 145 mmol/L   Potassium 4.0 3.5 - 5.1 mmol/L   Chloride 106 98 - 111 mmol/L   CO2 21 (L) 22 - 32 mmol/L   Glucose, Bld 100 (H) 70 - 99 mg/dL    Comment: Glucose reference range applies only to samples taken after fasting for at  least 8 hours.   BUN 12 6 - 20 mg/dL   Creatinine, Ser 6.04 0.44 - 1.00 mg/dL   Calcium 8.0 (L) 8.9 - 10.3 mg/dL   Total Protein 6.9 6.5 - 8.1 g/dL   Albumin 3.9 3.5 - 5.0 g/dL   AST 32 15 - 41 U/L   ALT 17 0 - 44 U/L   Alkaline Phosphatase 52 38 - 126 U/L   Total Bilirubin 1.9 (H) <1.2 mg/dL   GFR, Estimated >54 >09 mL/min     Comment: (NOTE) Calculated using the CKD-EPI Creatinine Equation (2021)    Anion gap 8 5 - 15    Comment: Performed at [facility], [address], [place], [place] [address]  Magnesium     Status: None   Collection Time: 05/06/23  5:32 AM  Result Value Ref Range   Magnesium 2.3 1.7 - 2.4 mg/dL    Comment: Performed at [facility], [address] [place], [place] [address]  Protime-INR     Status: Abnormal   Collection Time: 05/06/23  5:32 AM  Result Value Ref Range   Prothrombin Time 24.2 (H) 11.4 - 15.2 seconds   INR 2.1 (H) 0.8 - 1.2    Comment: (NOTE) INR goal varies based on device and disease states. Performed at [facility], [address], [place], [place] [address]   APTT     Status: Abnormal   Collection Time: 05/06/23  5:32 AM  Result Value Ref Range   aPTT 38 (H) 24 - 36 seconds    Comment:        IF BASELINE aPTT IS ELEVATED, SUGGEST PATIENT RISK ASSESSMENT BE USED TO DETERMINE APPROPRIATE ANTICOAGULANT THERAPY. Performed at [facility], [address] [place], [place] [address]   Reticulocytes     Status: Abnormal   Collection Time: 05/06/23  5:32 AM  Result Value Ref Range   Retic Ct Pct 16.5 (H) 0.4 - 3.1 %    Comment: REPEATED TO VERIFY VERIFIED BY MANUAL DILUTION    RBC. 2.21 (L) 3.87 - 5.11 MIL/uL   Retic Count, Absolute 380.0 (H) 19.0 - 186.0 K/uL   Immature Retic Fract 9.4 2.3 - 15.9 %    Comment: Performed at [facility], [address] [place], [place] [address]   No results found.  Pending Labs Unresulted Labs (From admission, onward)    None       Vitals/Pain Today's Vitals   05/06/23 1323 05/06/23 1334 05/06/23 1347 05/06/23 1415  BP:  113/66  101/70  Pulse:  93  83  Resp:  10    Temp:  98.4 F (36.9 C)    TempSrc:  Oral    SpO2:  97%  96%  PainSc: 3   5      Isolation Precautions No active  isolations  Medications Medications  ondansetron (ZOFRAN) injection 4 mg (has no administration in time range)  naloxone [facility]) injection 0.4 mg (has no administration in time range)  HYDROmorphone (DILAUDID) injection 2 mg (2 mg Intravenous Given 05/06/23 0954)  acetaminophen (TYLENOL) tablet 650 mg (has no administration in time range)    Or  acetaminophen (TYLENOL) suppository 650 mg (has no administration in time range)  melatonin tablet 3 mg (has no administration in time range)  rivaroxaban (XARELTO) tablet 20 mg (20 mg Oral Given 05/06/23 0055)  diphenhydrAMINE (BENADRYL) capsule 25 mg (25 mg Oral Given 05/06/23 0653)  HYDROmorphone (DILAUDID) 1 mg/mL PCA  injection (has no administration in time range)  mometasone-formoterol (DULERA) 100-5 MCG/ACT inhaler 2 puff (2 puffs Inhalation Given 05/06/23 0529)  Deferasirox TABS 1,080 mg (1,080 mg Oral Not Given 05/06/23 1107)  0.45 % sodium chloride infusion (0 mLs Intravenous Stopped 05/06/23 1029)  docusate sodium (COLACE) capsule 100 mg (100 mg Oral Given 05/06/23 0951)  polyethylene glycol (MIRALAX / GLYCOLAX) packet 17 g (has no administration in time range)  ketorolac (TORADOL) 15 MG/ML injection 15 mg (15 mg Intravenous Given 05/06/23 1347)  albuterol (PROVENTIL) (2.5 MG/3ML) 0.083% nebulizer solution 2.5 mg (has no administration in time range)  ketorolac (TORADOL) 15 MG/ML injection 15 mg (15 mg Intravenous Given 05/05/23 2119)  diphenhydrAMINE (BENADRYL) 12.5 mg in sodium chloride 0.9 % 50 mL IVPB (0 mg Intravenous Stopped 05/05/23 2228)  HYDROmorphone (DILAUDID) injection 2 mg (2 mg Intravenous Given 05/05/23 2119)  HYDROmorphone (DILAUDID) injection 2 mg (2 mg Intravenous Given 05/05/23 2153)  HYDROmorphone (DILAUDID) injection 2 mg (2 mg Intravenous Given 05/05/23 2235)    Mobility walks

## 2023-05-06 NOTE — Progress Notes (Signed)
Subjective: Molly Gutierrez is a 42 year old female with a medical history significant for sickle cell disease, chronic pain syndrome, anemia of chronic disease opiate dependence and tolerance, mild intermittent asthma, and history of PE on Xarelto who was admitted for sickle cell pain crisis. Patient continues to have pain primarily to her low back, chest, and lower extremities.  She rates pain as 8/10.  She denies any headache, shortness of breath, urinary symptoms, nausea, vomiting, or diarrhea.  Objective:  Vital signs in last 24 hours:  Vitals:   05/06/23 0949 05/06/23 1141 05/06/23 1334 05/06/23 1415  BP: 102/69 96/69 113/66 101/70  Pulse:  86 93 83  Resp:  13 10   Temp:   98.4 F (36.9 C)   TempSrc:   Oral   SpO2:  97% 97% 96%    Intake/Output from previous day:   Intake/Output Summary (Last 24 hours) at 05/06/2023 1608 Last data filed at 05/06/2023 0014 Gross per 24 hour  Intake 150 ml  Output --  Net 150 ml    Physical Exam: General: Alert, awake, oriented x3, in no acute distress.  HEENT: [place]/AT PEERL, EOMI Neck: Trachea midline,  no masses, no thyromegal,y no JVD, no carotid bruit OROPHARYNX:  Moist, No exudate/ erythema/lesions.  Heart: Regular rate and rhythm, without murmurs, rubs, gallops, PMI non-displaced, no heaves or thrills on palpation.  Lungs: Clear to auscultation, no wheezing or rhonchi noted. No increased vocal fremitus resonant to percussion  Abdomen: Soft, nontender, nondistended, positive bowel sounds, no masses no hepatosplenomegaly noted..  Neuro: No focal neurological deficits noted cranial nerves II through XII grossly intact. DTRs 2+ bilaterally upper and lower extremities. Strength 5 out of 5 in bilateral upper and lower extremities. Musculoskeletal: No warm swelling or erythema around joints, no spinal tenderness noted. Psychiatric: Patient alert and oriented x3, good insight and cognition, good recent to remote recall. Lymph node  survey: No cervical axillary or inguinal lymphadenopathy noted.  Lab Results:  Basic Metabolic Panel:    Component Value Date/Time   NA 135 05/06/2023 0532   NA 137 12/23/2022 1210   K 4.0 05/06/2023 0532   CL 106 05/06/2023 0532   CO2 21 (L) 05/06/2023 0532   BUN 12 05/06/2023 0532   BUN 5 (L) 12/23/2022 1210   CREATININE 0.64 05/06/2023 0532   GLUCOSE 100 (H) 05/06/2023 0532   CALCIUM 8.0 (L) 05/06/2023 0532   CBC:    Component Value Date/Time   WBC 15.3 (H) 05/06/2023 0532   HGB 7.4 (L) 05/06/2023 0532   HGB 7.9 (L) 12/23/2022 1210   HCT 20.4 (L) 05/06/2023 0532   HCT 24.4 (L) 12/23/2022 1210   PLT 459 (H) 05/06/2023 0532   PLT 554 (H) 12/23/2022 1210   MCV 92.7 05/06/2023 0532   MCV 94 12/23/2022 1210   NEUTROABS 9.0 (H) 05/06/2023 0532   NEUTROABS 10.4 (H) 12/23/2022 1210   LYMPHSABS 3.8 05/06/2023 0532   LYMPHSABS 2.9 12/23/2022 1210   MONOABS 2.1 (H) 05/06/2023 0532   EOSABS 0.3 05/06/2023 0532   EOSABS 0.2 12/23/2022 1210   BASOSABS 0.1 05/06/2023 0532   BASOSABS 0.1 12/23/2022 1210    No results found for this or any previous visit (from the past 240 hour(s)).  Studies/Results: No results found.  Medications: Scheduled Meds:  Deferasirox  1,080 mg Oral Daily   docusate sodium  100 mg Oral BID   HYDROmorphone   Intravenous Q4H   ketorolac  15 mg Intravenous Q6H   mometasone-formoterol  2 puff Inhalation  BID   rivaroxaban  20 mg Oral QHS   Continuous Infusions:  sodium chloride     PRN Meds:.acetaminophen **OR** acetaminophen, albuterol, diphenhydrAMINE, HYDROmorphone (DILAUDID) injection, melatonin, naLOXone (NARCAN)  injection, ondansetron, polyethylene glycol  Consultants: none  Procedures: none  Antibiotics: none  Assessment/Plan: Principal Problem:   Sickle cell pain crisis (HCC) Active Problems:   History of pulmonary embolism   Moderate persistent asthma   Anemia of chronic disease   SIRS (systemic inflammatory response  syndrome) (HCC)  Sickle cell disease with pain crisis: Continue IV Dilaudid PCA, adjust settings to 0.5 mg, 10-minute lockout, and 3 mg/h. Toradol 15 mg IV every 6 hours Hold oral oxycodone, will restart as pain intensity improves. Continue IV fluids, 0.45% saline at 50 mL/h Monitor vital signs very closely, reevaluate pain scale regularly, and supplemental oxygen as needed  Anemia of chronic disease: Hemoglobin 7.4 g/dL.  Patient's baseline is 7-8 g/dL.  No clinical indication for blood transfusion at this time.  Continue to monitor closely.  Labs in AM.  Leukocytosis: Mild leukocytosis.  More than likely reactive secondary to sickle cell disease.  No signs of active infection.  Monitor closely without antibiotics.  Labs in AM.  Mild intermittent asthma: Continue home medications as needed  History of PE: Continue Xarelto  Code Status: Full Code Family Communication: N/A Disposition Plan: Not yet ready for discharge  Molly Gutierrez Molly Petty  APRN, MSN, FNP-C [facility] [address] [place], [place] [address] [phone]  If 7PM-7AM, please contact night-coverage.  05/06/2023, 4:08 PM  LOS: 0 days

## 2023-05-07 ENCOUNTER — Other Ambulatory Visit (HOSPITAL_COMMUNITY): Payer: Self-pay

## 2023-05-07 ENCOUNTER — Other Ambulatory Visit: Payer: Self-pay

## 2023-05-07 DIAGNOSIS — D5700 Hb-SS disease with crisis, unspecified: Secondary | ICD-10-CM | POA: Diagnosis not present

## 2023-05-07 LAB — CBC
HCT: 18.9 % — ABNORMAL LOW (ref 36.0–46.0)
Hemoglobin: 6.7 g/dL — CL (ref 12.0–15.0)
MCH: 32.2 pg (ref 26.0–34.0)
MCHC: 35.4 g/dL (ref 30.0–36.0)
MCV: 90.9 fL (ref 80.0–100.0)
Platelets: 424 10*3/uL — ABNORMAL HIGH (ref 150–400)
RBC: 2.08 MIL/uL — ABNORMAL LOW (ref 3.87–5.11)
RDW: 19.7 % — ABNORMAL HIGH (ref 11.5–15.5)
WBC: 13.7 10*3/uL — ABNORMAL HIGH (ref 4.0–10.5)
nRBC: 0.3 % — ABNORMAL HIGH (ref 0.0–0.2)

## 2023-05-07 LAB — HEMOGLOBIN AND HEMATOCRIT, BLOOD
HCT: 23.6 % — ABNORMAL LOW (ref 36.0–46.0)
Hemoglobin: 8.1 g/dL — ABNORMAL LOW (ref 12.0–15.0)

## 2023-05-07 LAB — PREPARE RBC (CROSSMATCH)

## 2023-05-07 MED ORDER — SODIUM CHLORIDE 0.9% IV SOLUTION: Freq: Once | INTRAVENOUS | Status: AC

## 2023-05-07 MED ORDER — DIPHENHYDRAMINE HCL 50 MG/ML IJ SOLN
12.5000 mg | Freq: Once | INTRAMUSCULAR | Status: AC
  Administered 2023-05-07: 12.5 mg via INTRAVENOUS
  Filled 2023-05-07: qty 1

## 2023-05-07 NOTE — Plan of Care (Signed)
  Problem: Education: Goal: Knowledge of vaso-occlusive preventative measures will improve Outcome: Progressing Goal: Awareness of infection prevention will improve Outcome: Progressing Goal: Awareness of signs and symptoms of anemia will improve Outcome: Progressing Goal: Long-term complications will improve Outcome: Progressing   

## 2023-05-07 NOTE — Progress Notes (Signed)
Subjective: Molly Gutierrez is a 42 year old female with a medical history significant for sickle cell disease, chronic pain syndrome, anemia of chronic disease opiate dependence and tolerance, mild intermittent asthma, and history of PE on Xarelto who was admitted for sickle cell pain crisis.  Patient continues to have pain primarily to her low back, chest, and lower extremities.  She rates pain as 8/10.  She denies any headache, shortness of breath, urinary symptoms, nausea, vomiting, or diarrhea.  Objective:  Vital signs in last 24 hours:  Vitals:   05/07/23 0730 05/07/23 0756 05/07/23 0957 05/07/23 1134  BP:   (!) 93/58   Pulse:   79   Resp: 12  20 15   Temp:   99 F (37.2 C)   TempSrc:   Oral   SpO2: 92% 93% 94% 93%  Weight:      Height:        Intake/Output from previous day:   Intake/Output Summary (Last 24 hours) at 05/07/2023 1225 Last data filed at 05/07/2023 0121 Gross per 24 hour  Intake 440.97 ml  Output --  Net 440.97 ml    Physical Exam: General: Alert, awake, oriented x3, in no acute distress.  HEENT: [place]/AT PEERL, EOMI Neck: Trachea midline,  no masses, no thyromegal,y no JVD, no carotid bruit OROPHARYNX:  Moist, No exudate/ erythema/lesions.  Heart: Regular rate and rhythm, without murmurs, rubs, gallops, PMI non-displaced, no heaves or thrills on palpation.  Lungs: Clear to auscultation, no wheezing or rhonchi noted. No increased vocal fremitus resonant to percussion  Abdomen: Soft, nontender, nondistended, positive bowel sounds, no masses no hepatosplenomegaly noted..  Neuro: No focal neurological deficits noted cranial nerves II through XII grossly intact. DTRs 2+ bilaterally upper and lower extremities. Strength 5 out of 5 in bilateral upper and lower extremities. Musculoskeletal: No warm swelling or erythema around joints, no spinal tenderness noted. Psychiatric: Patient alert and oriented x3, good insight and cognition, good recent to remote  recall. Lymph node survey: No cervical axillary or inguinal lymphadenopathy noted.  Lab Results:  Basic Metabolic Panel:    Component Value Date/Time   NA 135 05/06/2023 0532   NA 137 12/23/2022 1210   K 4.0 05/06/2023 0532   CL 106 05/06/2023 0532   CO2 21 (L) 05/06/2023 0532   BUN 12 05/06/2023 0532   BUN 5 (L) 12/23/2022 1210   CREATININE 0.64 05/06/2023 0532   GLUCOSE 100 (H) 05/06/2023 0532   CALCIUM 8.0 (L) 05/06/2023 0532   CBC:    Component Value Date/Time   WBC 13.7 (H) 05/07/2023 0500   HGB 6.7 (LL) 05/07/2023 0500   HGB 7.9 (L) 12/23/2022 1210   HCT 18.9 (L) 05/07/2023 0500   HCT 24.4 (L) 12/23/2022 1210   PLT 424 (H) 05/07/2023 0500   PLT 554 (H) 12/23/2022 1210   MCV 90.9 05/07/2023 0500   MCV 94 12/23/2022 1210   NEUTROABS 9.0 (H) 05/06/2023 0532   NEUTROABS 10.4 (H) 12/23/2022 1210   LYMPHSABS 3.8 05/06/2023 0532   LYMPHSABS 2.9 12/23/2022 1210   MONOABS 2.1 (H) 05/06/2023 0532   EOSABS 0.3 05/06/2023 0532   EOSABS 0.2 12/23/2022 1210   BASOSABS 0.1 05/06/2023 0532   BASOSABS 0.1 12/23/2022 1210    No results found for this or any previous visit (from the past 240 hour(s)).  Studies/Results: No results found.  Medications: Scheduled Meds:  sodium chloride   Intravenous Once   Chlorhexidine Gluconate Cloth  6 each Topical Daily   Deferasirox  1,080 mg Oral  Daily   diphenhydrAMINE  12.5 mg Intravenous Once   docusate sodium  100 mg Oral BID   HYDROmorphone   Intravenous Q4H   ketorolac  15 mg Intravenous Q6H   mometasone-formoterol  2 puff Inhalation BID   rivaroxaban  20 mg Oral QHS   Continuous Infusions:   PRN Meds:.acetaminophen **OR** acetaminophen, albuterol, diphenhydrAMINE, HYDROmorphone (DILAUDID) injection, melatonin, naLOXone (NARCAN)  injection, ondansetron, polyethylene glycol  Consultants: none  Procedures: none  Antibiotics: none  Assessment/Plan: Principal Problem:   Sickle cell pain crisis (HCC) Active  Problems:   History of pulmonary embolism   Moderate persistent asthma   Anemia of chronic disease   SIRS (systemic inflammatory response syndrome) (HCC)  Sickle cell disease with pain crisis: Continue IV dilaudid PCA without changes Toradol 15 mg IV every 6 hours Hold oral oxycodone, will restart as pain intensity improves. Continue IV fluids, 0.45% saline at 50 mL/h Monitor vital signs very closely, reevaluate pain scale regularly, and supplemental oxygen as needed  Anemia of chronic disease: Hemoglobin decreased below baseline at 6.7. Transfuse 1 unit of PRBCs. Continue to monitor closely.  Labs in AM.  Leukocytosis: Mild leukocytosis.  More than likely reactive secondary to sickle cell disease.  No signs of active infection.  Monitor closely without antibiotics.  Labs in AM.  Mild intermittent asthma: Continue home medications as needed  History of PE: Continue Xarelto  Code Status: Full Code Family Communication: N/A Disposition Plan: Not yet ready for discharge  Isidore Margraf [name]  APRN, MSN, FNP-C [facility] [address] [place], [place] [address] [phone]  If 7PM-7AM, please contact night-coverage.  05/07/2023, 12:25 PM  LOS: 1 day

## 2023-05-07 NOTE — Progress Notes (Signed)
   05/07/23 0542  Provider Notification  Provider Name/Title Chinita Greenland, NP  Date Provider Notified 05/07/23  Time Provider Notified [phone]  Method of Notification Page  Notification Reason Critical Result  Test performed and critical result Hgb-6.7  Date Critical Result Received 05/07/23  Time Critical Result Received 0537  Provider response See new orders  Date of Provider Response 05/07/23  Time of Provider Response 0549   1 unit PRBC ordered.

## 2023-05-08 LAB — BPAM RBC
Blood Product Expiration Date: 202412142359
ISSUE DATE / TIME: 202411201343
Unit Type and Rh: 9500

## 2023-05-08 LAB — TYPE AND SCREEN
ABO/RH(D): A POS
Antibody Screen: NEGATIVE
Unit division: 0

## 2023-05-08 NOTE — Consult Note (Signed)
[facility] Liaison Consult Note    05/08/2023  Molly Gutierrez 03/06/1981 [id number]  Insurance: Francine Graven Medicare SNP plan   Primary Care Provider: [name], NP [facility],  this provider is listed for the transition of care follow up appointments  and [facility] calls   [facility] Liaison screened the patient remotely at [facility].    The patient was screened for 30 day readmission hospitalization with noted extreme risk score for unplanned readmission risk 2 ED and 6 hospital admissions in 6 months.   Review of patient's electronic medical record reveals patient is noted with a [employer] Medicare SNP program for care coordination.   Also patient was offered the 30 day VBCI TOC follow up program and declined recently on 04/29/23.  Plan: [facility] Liaison will continue to follow progress and disposition to asess for post hospital community care coordination/management needs.  Referral request for community care coordination: anticipate [facility] team to follow post hospital and offer further follow up.   VBCI Community Care, Population Health does not replace or interfere with any arrangements made by the Inpatient Transition of Care team.   For questions contact:   Charlesetta Shanks, RN, BSN, CCM [place]  [facility] Liaison Direct Dial: [phone] or secure chat Email: [email or web address]

## 2023-05-09 ENCOUNTER — Other Ambulatory Visit (HOSPITAL_COMMUNITY): Payer: Self-pay

## 2023-05-09 DIAGNOSIS — D5700 Hb-SS disease with crisis, unspecified: Secondary | ICD-10-CM | POA: Diagnosis not present

## 2023-05-09 LAB — CBC
HCT: 21.2 % — ABNORMAL LOW (ref 36.0–46.0)
Hemoglobin: 7.4 g/dL — ABNORMAL LOW (ref 12.0–15.0)
MCH: 29.5 pg (ref 26.0–34.0)
MCHC: 34.9 g/dL (ref 30.0–36.0)
MCV: 84.5 fL (ref 80.0–100.0)
Platelets: 415 10*3/uL — ABNORMAL HIGH (ref 150–400)
RBC: 2.51 MIL/uL — ABNORMAL LOW (ref 3.87–5.11)
RDW: 22.5 % — ABNORMAL HIGH (ref 11.5–15.5)
WBC: 13.9 10*3/uL — ABNORMAL HIGH (ref 4.0–10.5)
nRBC: 0.1 % (ref 0.0–0.2)

## 2023-05-09 MED ORDER — HEPARIN SOD (PORK) LOCK FLUSH 100 UNIT/ML IV SOLN
500.0000 [IU] | Freq: Once | INTRAVENOUS | Status: AC
  Administered 2023-05-09: 500 [IU] via INTRAVENOUS
  Filled 2023-05-09: qty 5

## 2023-05-09 MED ORDER — HYDROMORPHONE HCL 4 MG PO TABS: 4.0000 mg | ORAL_TABLET | ORAL | 0 refills | Status: DC

## 2023-05-09 NOTE — Plan of Care (Signed)
  Problem: Education: Goal: Knowledge of vaso-occlusive preventative measures will improve Outcome: Progressing Goal: Awareness of infection prevention will improve Outcome: Progressing Goal: Awareness of signs and symptoms of anemia will improve Outcome: Progressing Goal: Long-term complications will improve Outcome: Progressing   Problem: Activity: Goal: Risk for activity intolerance will decrease Outcome: Progressing   Problem: Nutrition: Goal: Adequate nutrition will be maintained Outcome: Progressing   Problem: Coping: Goal: Level of anxiety will decrease Outcome: Progressing   Problem: Elimination: Goal: Will not experience complications related to bowel motility Outcome: Progressing Goal: Will not experience complications related to urinary retention Outcome: Progressing   Problem: Pain Management: Goal: General experience of comfort will improve Outcome: Progressing   Problem: Safety: Goal: Ability to remain free from injury will improve Outcome: Progressing

## 2023-05-09 NOTE — Progress Notes (Signed)
AVS instructions given. Pt A&O, VSS, port deaccessed, IV removed. Pt declines wheelchair. Leaving ambulatory to discharge lounge.

## 2023-05-09 NOTE — Discharge Summary (Signed)
Physician Discharge Summary  Molly [facility]rez[employer] Inc ZOX:[id number] DOB: [birth year] DOA: 05/05/2023  PCP: [name], NP  Admit date: 05/05/2023  Discharge date: 05/09/2023  Discharge Diagnoses:  Principal Problem:   Sickle cell pain crisis (HCC) Active Problems:   History of pulmonary embolism   Moderate persistent asthma   Anemia of chronic disease   SIRS (systemic inflammatory response syndrome) (HCC)   Discharge Condition: Stable  Disposition:   Follow-up Information     [name], NP Follow up in 1 week(s).   Specialties: Pulmonary Disease, Endocrinology Why: Labs: Repeat CBCw/diff and CMP Contact information: [address] [place] [address] [phone]                Pt is discharged home in good condition and is to follow up with [name], NP this week to have labs evaluated. Molly Gutierrez is instructed to increase activity slowly and balance with rest for the next few days, and use prescribed medication to complete treatment of pain  Diet: Regular Wt Readings from Last 3 Encounters:  05/09/23 57.4 kg  05/05/23 53.1 kg  04/22/23 50.4 kg    History of present illness:  Molly Gutierrez is a 42 year old female with a medical history significant for sickle cell disease with normal prior hospitalizations for acute sickle cell pain crisis, chronic anemia of sickle cell disease with baseline hemoglobin range  7.5-8.5, history of pulmonary embolism on Xarelto, moderate persistent asthma who was admitted to [facility] on 05/05/2023 with acute sickle cell pain crisis after presenting from home to [facility] emergency department complaining of pain in the bilateral lower extremities. In this patient with a documented history of sickle cell disease, the patient reports 1 day of persistent pain in the bilateral lower extremities, and distribution, quality, and intensity that is consistent with pain experienced at  time of their prior sickle cell pain crisis. Denies any associated chest pain, shortness of breath, palpitations, dizziness, nausea, vomiting, diarrhea, dizziness, presyncope, or syncope, also denies any recent subjective fever, chills, rigors, or generalized myalgias.  No recent headache, neck stiffness, rash, cough, abdominal pain, dysuria, or gross hematuria.  Patient notes multiple prior hospitalizations for acute sickle cell pain crisis, with most recent prior hospitalization for such occurring in the [facility] 2 weeks ago.  She conveys that she also gets her care in [place] and has been admitted to the [facility] in [facility] for acute sickle cell pain crisis on multiple prior occasions as well. The patient reports poor pain control at home in spite of of Dilaudid as an outpatient, which prompted her to present to the sickle cell pain clinic earlier today.  After multiple doses of analgesics administered at the sickle cell pain clinic, with instant patient pain control, the patient essentially presented to [facility] emergency department for further evaluation, management including pain control relating to her ongoing discomfort in her bilateral lower extremities. Per chart review, baseline hemoglobin range appears to be 7.5-8.5, with most recent prior hemoglobin data point noted to be 8.1 on 01/25/2023.  Additionally, per chart review, most recent reticulocyte count percentage noted to be 6.3% on 04/22/2023.  ER course: Vital signs in the ED were notable for the following: Afebrile; heart rates in the 70s to 97.  Blood pressure in the 90s to low 100s.  Respiratory rate 15-16, oxygen saturation 98% on room air. Labs were notable for the following: CMP was notable for the following: Creatinine 0.61, total  bilirubin 2.3 relative to demonstration prior value 3.1 on 04/23/2023, all other liver enzymes were within normal limits.  CBC notable for white cell count of 15,000 with 57% neutrophils,  relative demonstration prior with cell count of 20,600 on 04/27/2023, hemoglobin 8, associates normocytic, normochromic findings and platelet count of 478,000.  Reticulocyte count percentage 16.3%. Primary interpretation, EKG in ED demonstrated the following: No EKG performed in ED today. Imaging in the ED, per corresponding formal radiology read was notable for no imaging performed. While in the ED, the following were administered: Dilaudid 2 mg IV x 3 doses, Toradol 15 mg IV x 1, Benadryl 12.5 mg IV x 1 dose. In the setting of poorly controlled pain following 4 doses of IV analgesics in the ED, the patient is subsequently being admitted for further evaluation/management of presenting acute sickle cell pain crisis including an emphasis on pain control.  Gutierrez Course:  Sickle cell disease with pain crisis: Patient was admitted for sickle cell pain crisis and managed appropriately with IVF, IV Dilaudid via PCA and IV Toradol, as well as other adjunct therapies per sickle cell pain management protocols.  IV Dilaudid PCA weaned appropriately.  Patient transition to her home medications.  Patient states that she does not have medications at home.  Dilaudid 4 mg every 6 hours as needed for severe breakthrough pain #60 was sent to patient's pharmacy.  PDMP reviewed prior to prescribing opiate medications, no inconsistencies were noted.  Patient states that her pain intensity is 5/10 and she feels that she can manage at home at this time. Anemia of chronic disease: Patient's hemoglobin baseline is 7-8 g/dL.  Prior to discharge, hemoglobin is 7.4 g/dL.  Patient is s/p 1 unit PRBCs during admission.  Patient will follow-up with her PCP to repeat CBC with differential and CMP in 1 week. Patient is aware of all upcoming appointments.  Patient was therefore discharged home today in a hemodynamically stable condition.   Molly Gutierrez will follow-up with PCP within 1 week of this discharge. Molly Gutierrez was counseled  extensively about nonpharmacologic means of pain management, patient verbalized understanding and was appreciative of  the care received during this admission.   We discussed the need for good hydration, monitoring of hydration status, avoidance of heat, cold, stress, and infection triggers. We discussed the need to be adherent with taking Hydrea and other home medications. Patient was reminded of the need to seek medical attention immediately if any symptom of bleeding, anemia, or infection occurs.  Discharge Exam: Vitals:   05/09/23 1052 05/09/23 1229  BP: 100/66   Pulse: 74   Resp: 16 20  Temp: 98.7 F (37.1 C)   SpO2: 93% 93%   Vitals:   05/09/23 0614 05/09/23 0804 05/09/23 1052 05/09/23 1229  BP: 120/76  100/66   Pulse: 76  74   Resp: 16 12 16 20   Temp: 98 F (36.7 C)  98.7 F (37.1 C)   TempSrc: Oral  Oral   SpO2: 96% 92% 93% 93%  Weight:      Height:        General appearance : Awake, alert, not in any distress. Speech Clear. Not toxic looking HEENT: Atraumatic and Normocephalic, pupils equally reactive to light and accomodation Neck: Supple, no JVD. No cervical lymphadenopathy.  Chest: Good air entry bilaterally, no added sounds  CVS: S1 S2 regular, no murmurs.  Abdomen: Bowel sounds present, Non tender and not distended with no gaurding, rigidity or rebound. Extremities: B/L Lower Ext shows no edema,  both legs are warm to touch Neurology: Awake alert, and oriented X 3, CN II-XII intact, Non focal Skin: No Rash  Discharge Instructions  Discharge Instructions     Discharge patient   Complete by: As directed    Discharge disposition: 01-Home or Self Care   Discharge patient date: 05/09/2023      Allergies as of 05/09/2023       Reactions   Cefaclor Hives, Swelling, Rash   Hydroxyurea Palpitations, Other (See Comments)   Lowers "blood levels" and heart rate (causes HYPOtension); "it messes me up, it drops my levels and stuff" and Hypotension   Omeprazole  Other (See Comments)   Causes "sharp pains in the stomach"   Ketamine Palpitations, Other (See Comments)   "Pt states she has had previous reaction to ketamine. States she becomes flushed, heart races, dizzy, and feels like she is going to pass out."        Medication List     TAKE these medications    acetaminophen 500 MG tablet Commonly known as: TYLENOL Take 500 mg by mouth every 6 (six) hours as needed for mild pain or headache (or cramps).   albuterol 108 (90 Base) MCG/ACT inhaler Commonly known as: VENTOLIN HFA TAKE 2 PUFFS BY MOUTH EVERY 6 HOURS AS NEEDED FOR WHEEZE OR SHORTNESS OF BREATH What changed: See the new instructions.   Deferasirox 360 MG Tabs Commonly known as: Jadenu Take 3 tablets (1,080 mg total) by mouth daily.   diphenhydrAMINE 25 MG tablet Commonly known as: BENADRYL Take 25 mg by mouth See admin instructions. Take 25 mg by mouth every four to six hours with each dose of Hydromorphone   fluticasone 50 MCG/ACT nasal spray Commonly known as: FLONASE Place 1 spray into both nostrils daily as needed for allergies or rhinitis.   folic acid 1 MG tablet Commonly known as: FOLVITE Take 1 tablet (1 mg total) by mouth daily.   HYDROmorphone 4 MG tablet Commonly known as: DILAUDID Take 1 tablet (4 mg total) by mouth as directed. Start date 04/29/23   mirtazapine 45 MG tablet Commonly known as: REMERON Take 1 tablet (45 mg total) by mouth at bedtime.   naloxone 4 MG/0.1ML Liqd nasal spray kit Commonly known as: NARCAN Place 0.1 sprays (0.4 mg total) into the nose once as needed (opioid overdose).   polyethylene glycol 17 g packet Commonly known as: MIRALAX / GLYCOLAX Take 17 g by mouth daily as needed for mild constipation (MIX AS DIRECTED AND DRINK).   promethazine 25 MG tablet Commonly known as: PHENERGAN Take 0.5-1 tablets (12.5-25 mg total) by mouth every 6 (six) hours as needed for nausea or vomiting.   Symbicort 80-4.5 MCG/ACT  inhaler Generic drug: budesonide-formoterol INHALE 2 PUFFS INTO THE LUNGS TWICE A DAY What changed: See the new instructions.   VITAMIN B-12 PO Take 1 tablet by mouth at bedtime.   Vitamin D (Ergocalciferol) 1.25 MG (50000 UNIT) Caps capsule Commonly known as: DRISDOL Take 1 capsule (50,000 Units total) by mouth once a week. What changed: when to take this   VITAMIN D3 PO Take 1 capsule by mouth at bedtime.   Xarelto 20 MG Tabs tablet Generic drug: rivaroxaban Take 1 tablet (20 mg total) by mouth daily with supper. What changed: when to take this        The results of significant diagnostics from this hospitalization (including imaging, microbiology, ancillary and laboratory) are listed below for reference.    Significant Diagnostic Studies: DG Chest Portable  1 View  Result Date: 04/22/2023 CLINICAL DATA:  Chest and back pain EXAM: PORTABLE CHEST 1 VIEW COMPARISON:  12/23/2022 FINDINGS: Single frontal view of the chest demonstrate stable bilateral chest wall port. Cardiac silhouette is unremarkable. No acute airspace disease, effusion, or pneumothorax. No acute bony abnormality. IMPRESSION: 1. Stable chest, no acute process. Electronically Signed   By: Sharlet Salina M.D.   On: 04/22/2023 21:21    Microbiology: No results found for this or any previous visit (from the past 240 hour(s)).   Labs: Basic Metabolic Panel: Recent Labs  Lab 05/05/23 1145 05/05/23 2108 05/06/23 0532  NA 137 136 135  K 3.9 3.8 4.0  CL 105 109 106  CO2 20* 21* 21*  GLUCOSE 92 97 100*  BUN 10 10 12   CREATININE 0.52 0.61 0.64  CALCIUM 8.7* 8.6* 8.0*  MG  --   --  2.3   Liver Function Tests: Recent Labs  Lab 05/05/23 1145 05/05/23 2108 05/06/23 0532  AST 34 33 32  ALT 19 19 17   ALKPHOS 59 53 52  BILITOT 2.2* 2.3* 1.9*  PROT 7.8 7.7 6.9  ALBUMIN 4.0 4.1 3.9   No results for input(s): "LIPASE", "AMYLASE" in the last 168 hours. No results for input(s): "AMMONIA" in the last 168  hours. CBC: Recent Labs  Lab 05/05/23 1145 05/05/23 2108 05/06/23 0532 05/07/23 0500 05/07/23 1912 05/09/23 1040  WBC 17.2* 15.5* 15.3* 13.7*  --  13.9*  NEUTROABS 11.9* 9.0* 9.0*  --   --   --   HGB 8.3* 8.0* 7.4* 6.7* 8.1* 7.4*  HCT 24.0* 22.5* 20.4* 18.9* 23.6* 21.2*  MCV 94.1 92.2 92.7 90.9  --  84.5  PLT 510* 478* 459* 424*  --  415*   Cardiac Enzymes: No results for input(s): "CKTOTAL", "CKMB", "CKMBINDEX", "TROPONINI" in the last 168 hours. BNP: Invalid input(s): "POCBNP" CBG: No results for input(s): "GLUCAP" in the last 168 hours.  Time coordinating discharge: 30 minutes  Signed:  Nolon Nations  APRN, MSN, FNP-C Patient Care [facility] [address] [place], [place] [address] [phone]  [facility] 05/09/2023, 3:02 PM

## 2023-05-10 DIAGNOSIS — R079 Chest pain, unspecified: Secondary | ICD-10-CM | POA: Diagnosis not present

## 2023-05-10 DIAGNOSIS — D5700 Hb-SS disease with crisis, unspecified: Secondary | ICD-10-CM | POA: Diagnosis not present

## 2023-05-12 ENCOUNTER — Other Ambulatory Visit: Payer: Self-pay

## 2023-05-12 ENCOUNTER — Telehealth: Payer: Self-pay | Admitting: Nurse Practitioner

## 2023-05-12 ENCOUNTER — Telehealth: Payer: Self-pay

## 2023-05-12 ENCOUNTER — Other Ambulatory Visit: Payer: Self-pay | Admitting: Nurse Practitioner

## 2023-05-12 DIAGNOSIS — D5700 Hb-SS disease with crisis, unspecified: Secondary | ICD-10-CM

## 2023-05-12 MED ORDER — HYDROMORPHONE HCL 4 MG PO TABS: 4.0000 mg | ORAL_TABLET | Freq: Four times a day (QID) | ORAL | 0 refills | Status: DC | PRN

## 2023-05-12 NOTE — Telephone Encounter (Signed)
Copied from CRM [id number]. Topic: Clinical - Medication Refill >> May 12, 2023  3:33 PM Hector Shade B wrote: Most Recent Primary Care Visit:  Provider: [name]  Department: [facility]  Visit Type: OFFICE VISIT  Date: 05/05/2023  Medication: Take 1 tablet (4 mg total) by mouth every 6 (six) hours as needed for up to 15 days for severe pain (pain score 7-10)., Starting Mon 05/12/2023, Until Tue 05/27/2023 at 2359,  Has the patient contacted their pharmacy? Yes Pharmacy states the way the order on the prescription has been sent in they can only give 1 4mg  tablet the prescription needs to be corrected (Agent: If no, request that the patient contact the pharmacy for the refill. If patient does not wish to contact the pharmacy document the reason why and proceed with request.) (Agent: If yes, when and what did the pharmacy advise?)  Is this the correct pharmacy for this prescription? Yes If no, delete pharmacy and type the correct one.  This is the patient's preferred pharmacy:  [facility] [address] [address] Phone: [phone] Fax: [phone]  [facility] [address] [place], [place] - [address] [place] [address] Phone: [phone] Fax: [phone]   Has the prescription been filled recently? Yes  Is the patient out of the medication? Yes  Has the patient been seen for an appointment in the last year OR does the patient have an upcoming appointment? Yes  Can we respond through MyChart? No  Agent: Please be advised that Rx refills may take up to 3 business days. We ask that you follow-up with your pharmacy.

## 2023-05-12 NOTE — Telephone Encounter (Signed)
Copied from CRM [id number]. Topic: Clinical - Medication Refill >> May 12, 2023 10:52 AM Molly Gutierrez H wrote: Reason for CRM: patient needs to pick up HYDROmorphone (DILAUDID) 4 MG tablet patient has not been able to get the medicine since the refill was to early. The pharmacy is stating they can't fill it because how it is written and pharmacy has been trying to reach out and has gotten no response

## 2023-05-12 NOTE — Transitions of Care (Post Inpatient/ED Visit) (Signed)
   05/12/2023  Name: Molly Gutierrez MRN: [id number] DOB: [birth year]  Today's TOC FU Call Status: Today's TOC FU Call Status:: Unsuccessful Call (1st Attempt) Unsuccessful Call (1st Attempt) Date: 05/12/23 -  RN TOC spoke with patient but was unable to complete full TOC assessment today as patient states she did not feel well due to increasing pain in her lower extremities, similar to the pain she experiences during her crisis and difficulty getting her pain medication filled after her hospital discharge.   Patient's Name and Date of Birth confirmed.  Transition Care Management Follow-up Telephone Call Date of Discharge: 05/09/23 Discharge Facility: [facility]) Type of Discharge: Inpatient Admission (Inpatient from 05/05/23 - 05/09/23 with ED visit on 05/10/23.) Primary Inpatient Discharge Diagnosis:: Sickle Cell Crisis How have you been since you were released from the hospital?: Worse ("My pain was not completely gone when I discharged on the 11/22 and had to go back to the emergency room 11/23 due to unrelieved pain in my legs.  Pharmacy could not fill my prescription the day of my discharge because they needed to clarify dosing") Any questions or concerns?: Yes Patient Questions/Concerns:: Patient reports increasing pain in her lower extremities similar to her crisis pain and states has been unable to get her pain rx filled because the Pharmacy said they needed clarification on the dosing. Patient Questions/Concerns Addressed: Notified Provider of Patient Questions/Concerns (Secure message sent to Primary Care Provider, Warner Mccreedy re: patient's reported symptoms and issue getting her pain rx filled.)  Items Reviewed: Did you receive and understand the discharge instructions provided?: Yes Medications obtained,verified, and reconciled?: No - Only reviewed with patient her discharge orders for her pain medication during this encounter due to her not feeling well. Medications  Not Reviewed Reasons: Advised Patient to Call Provider Office - Patient reported not feeling well due to pain in her lower extremities; problem getting her pain rx filled after she discharged on 05/09/23 and thus presented to ED 05/10/23 for acute pain symptom mgmt. Patient requested completion of TOC assessment be deferred until tomorrow 05/13/23.  Patient agreeable to and consented for me to contact her primary care provider to assist with getting clarification on her pain medicine prescription. Any new allergies since your discharge?: No  Medications Reviewed Today: Complete medication review was not completed today as patient was not feeling well at the time of our phone call and requested I follow up with her again tomorrow, May 13, 2023.  TOC Interventions: RN [facility] sent secure chat to primary care provider, Angus Seller, NP to advise of patient's increasing pain symptoms and difficulty getting her pain medication filled after her hospital discharge. Reviewed with patient her acute crisis treatment plan and post sickle cell crisis recovery instructions including getting proper rest, monitoring hydration status, avoidance of extreme temperatures, adherence with medications and when to seek immediate medical attention.  Follow Up Plan:  Patient agreeable to and scheduled for FU [facility] outreach assessment, May 13, 2023 at 9:00 am.     Wyline Mood BSN, RN RN Care Manager   Transitions of Care [facility] - [facility] Direct Dial Number:  [phone]

## 2023-05-12 NOTE — Patient Outreach (Signed)
  Care Coordination   F/U From Initial [facility] Call Visit Note   05/12/2023 Name: Molly Gutierrez MRN: [id number] DOB: 02/21/1981  Molly Gutierrez is a 42 y.o. year old female who sees [name], NP for primary care. I spoke with  [facility] by phone earlier today and she indicated that her pain level had increased to an 8 out 10 since her discharge from the hospital 05/09/2023.  Patient indicated that her pharmacy had been unable to refill her pain medication as they were awaiting a return outreach from her primary care provider to clarify her medication orders.  Patient had expressed concerns that she had been unsuccessful with getting a return call and requested if [facility] RNCM could assist.  Secure message was sent to her primary provider concerning patient's pain level and inability to get her medication filled.  What matters to the patients health and wellness today?  Follow up outreach made to patient to advise her that I had received follow-up communication from her primary care provider, Angus Seller, NP regarding her pain medication.  Advised patient that her PCP, Angus Seller, NP did contact her pharmacy, [facility], to clarify prescription orders for her pain medication so that it can be refilled today.  During telephone call with the patient, she was able to confirm, via her [facility] application, that her pharmacy has received the prescription order and her pain medication had been released for refill today.  Patient verified that she would be able to pick up her prescription and was very appreciative of the prompt follow-up.  Follow-up TOC visit scheduled with patient for 05/13/2023 with TOC RNCM to complete TOC Initial Assessment.  SDOH assessments and interventions completed:  No - Patient was unable to complete TOC Initial call due to pain.  Plan to address during scheduled TOC call 05/13/2023.    Care Coordination Interventions:  Yes, provided   Follow up plan: Follow up  call scheduled for 05/13/2023.    Encounter Outcome:  Patient Visit Completed   Alamin Mccuiston Daphine Deutscher BSN, RN RN Care Manager   Transitions of Care [facility] - [facility] Direct Dial Number:  [phone]

## 2023-05-12 NOTE — Telephone Encounter (Signed)
Please advise KH 

## 2023-05-13 ENCOUNTER — Other Ambulatory Visit: Payer: Self-pay

## 2023-05-13 ENCOUNTER — Telehealth: Payer: Self-pay

## 2023-05-13 NOTE — Transitions of Care (Post Inpatient/ED Visit) (Signed)
   05/13/2023  Name: Molly Gutierrez MRN: [id number] DOB: [birth year]  Today's TOC FU Call Status: Today's TOC FU Call Status:: TOC Follow Up Call to Offer TOC 30 Day Program -Unsuccessful Call  Unsuccessful Call Date: 05/13/23  Attempted to reach the patient regarding the most recent Inpatient/ED visit.  Unsuccessful scheduled appointment to offer TOC 30 Day Program enrollment.  TOC RNCM left voice message with my direct contact information for questions, concerns or should patient decide to enroll in [facility] program.    Follow Up Plan: No further outreach attempts will be made at this time. We have been unable to contact the patient.  Natahlia Hoggard Daphine Deutscher BSN, RN RN Care Manager   Transitions of Care [facility] - [facility] Direct Dial Number:  [phone]

## 2023-05-13 NOTE — Telephone Encounter (Signed)
Copied from CRM [id number]. Topic: Clinical - Prescription Issue >> May 13, 2023 12:01 PM Dennison Nancy wrote: Reason for CRM: Tiffany from [facility] fax a denial letter on 11/19/240 regarding patient medication Deferasirox (JADENU) 360 MG TABS Tiffany call back # [phone] . Patient is waiting for the medication  Please advise if you want to change due to the denial.

## 2023-05-13 NOTE — Progress Notes (Signed)
Pharmacy Patient Advocate Encounter  Received notification from [facility] that Prior Authorization for Molly Gutierrez has been DENIED.  PA denied due to platelet count. Faxed denial letter to office   PA #/Case ID/Reference #:  [id number]

## 2023-05-14 ENCOUNTER — Other Ambulatory Visit: Payer: Self-pay

## 2023-05-14 ENCOUNTER — Other Ambulatory Visit: Payer: Self-pay | Admitting: Family Medicine

## 2023-05-14 MED ORDER — DEFERIPRONE 500 MG PO TABS
500.0000 mg | ORAL_TABLET | Freq: Two times a day (BID) | ORAL | 0 refills | Status: DC
  Filled 2023-05-14 – 2023-06-04 (×2): qty 60, 30d supply, fill #0
  Filled 2023-06-25: qty 60, 30d supply, fill #1
  Filled 2023-08-06: qty 60, 30d supply, fill #2
  Filled 2023-09-15: qty 60, 30d supply, fill #3

## 2023-05-16 ENCOUNTER — Other Ambulatory Visit (HOSPITAL_COMMUNITY): Payer: Self-pay

## 2023-05-16 ENCOUNTER — Other Ambulatory Visit: Payer: Self-pay

## 2023-05-19 ENCOUNTER — Other Ambulatory Visit: Payer: Self-pay

## 2023-05-19 DIAGNOSIS — Z86711 Personal history of pulmonary embolism: Secondary | ICD-10-CM | POA: Diagnosis not present

## 2023-05-19 DIAGNOSIS — R0789 Other chest pain: Secondary | ICD-10-CM | POA: Diagnosis not present

## 2023-05-19 DIAGNOSIS — Z8709 Personal history of other diseases of the respiratory system: Secondary | ICD-10-CM | POA: Diagnosis not present

## 2023-05-19 DIAGNOSIS — J4540 Moderate persistent asthma, uncomplicated: Secondary | ICD-10-CM | POA: Diagnosis not present

## 2023-05-19 DIAGNOSIS — D696 Thrombocytopenia, unspecified: Secondary | ICD-10-CM | POA: Diagnosis not present

## 2023-05-19 DIAGNOSIS — R079 Chest pain, unspecified: Secondary | ICD-10-CM | POA: Diagnosis not present

## 2023-05-19 DIAGNOSIS — D5700 Hb-SS disease with crisis, unspecified: Secondary | ICD-10-CM | POA: Diagnosis not present

## 2023-05-19 DIAGNOSIS — Z7901 Long term (current) use of anticoagulants: Secondary | ICD-10-CM | POA: Diagnosis not present

## 2023-05-19 DIAGNOSIS — Z7951 Long term (current) use of inhaled steroids: Secondary | ICD-10-CM | POA: Diagnosis not present

## 2023-05-19 DIAGNOSIS — Z79899 Other long term (current) drug therapy: Secondary | ICD-10-CM | POA: Diagnosis not present

## 2023-05-19 DIAGNOSIS — E876 Hypokalemia: Secondary | ICD-10-CM | POA: Diagnosis not present

## 2023-05-20 DIAGNOSIS — Z8709 Personal history of other diseases of the respiratory system: Secondary | ICD-10-CM | POA: Diagnosis not present

## 2023-05-20 DIAGNOSIS — D5700 Hb-SS disease with crisis, unspecified: Secondary | ICD-10-CM | POA: Diagnosis not present

## 2023-05-20 DIAGNOSIS — E876 Hypokalemia: Secondary | ICD-10-CM | POA: Diagnosis not present

## 2023-05-21 ENCOUNTER — Other Ambulatory Visit: Payer: Self-pay

## 2023-05-21 DIAGNOSIS — Z8709 Personal history of other diseases of the respiratory system: Secondary | ICD-10-CM | POA: Diagnosis not present

## 2023-05-21 DIAGNOSIS — E876 Hypokalemia: Secondary | ICD-10-CM | POA: Diagnosis not present

## 2023-05-21 DIAGNOSIS — D5700 Hb-SS disease with crisis, unspecified: Secondary | ICD-10-CM | POA: Diagnosis not present

## 2023-05-22 DIAGNOSIS — E876 Hypokalemia: Secondary | ICD-10-CM | POA: Diagnosis not present

## 2023-05-22 DIAGNOSIS — D5700 Hb-SS disease with crisis, unspecified: Secondary | ICD-10-CM | POA: Diagnosis not present

## 2023-05-22 DIAGNOSIS — Z8709 Personal history of other diseases of the respiratory system: Secondary | ICD-10-CM | POA: Diagnosis not present

## 2023-05-23 ENCOUNTER — Other Ambulatory Visit: Payer: Self-pay | Admitting: Nurse Practitioner

## 2023-05-23 DIAGNOSIS — D5700 Hb-SS disease with crisis, unspecified: Secondary | ICD-10-CM | POA: Diagnosis not present

## 2023-05-23 DIAGNOSIS — E876 Hypokalemia: Secondary | ICD-10-CM | POA: Diagnosis not present

## 2023-05-23 DIAGNOSIS — Z8709 Personal history of other diseases of the respiratory system: Secondary | ICD-10-CM | POA: Diagnosis not present

## 2023-05-24 DIAGNOSIS — D5700 Hb-SS disease with crisis, unspecified: Secondary | ICD-10-CM | POA: Diagnosis not present

## 2023-05-24 DIAGNOSIS — Z8709 Personal history of other diseases of the respiratory system: Secondary | ICD-10-CM | POA: Diagnosis not present

## 2023-05-24 DIAGNOSIS — E876 Hypokalemia: Secondary | ICD-10-CM | POA: Diagnosis not present

## 2023-05-25 DIAGNOSIS — E876 Hypokalemia: Secondary | ICD-10-CM | POA: Diagnosis not present

## 2023-05-25 DIAGNOSIS — Z8709 Personal history of other diseases of the respiratory system: Secondary | ICD-10-CM | POA: Diagnosis not present

## 2023-05-25 DIAGNOSIS — D5700 Hb-SS disease with crisis, unspecified: Secondary | ICD-10-CM | POA: Diagnosis not present

## 2023-05-26 ENCOUNTER — Other Ambulatory Visit: Payer: Self-pay

## 2023-05-26 DIAGNOSIS — D5700 Hb-SS disease with crisis, unspecified: Secondary | ICD-10-CM

## 2023-05-26 DIAGNOSIS — E876 Hypokalemia: Secondary | ICD-10-CM | POA: Diagnosis not present

## 2023-05-26 DIAGNOSIS — Z8709 Personal history of other diseases of the respiratory system: Secondary | ICD-10-CM | POA: Diagnosis not present

## 2023-05-26 MED ORDER — HYDROMORPHONE HCL 4 MG PO TABS
4.0000 mg | ORAL_TABLET | Freq: Four times a day (QID) | ORAL | 0 refills | Status: DC | PRN
Start: 2023-05-26 — End: 2023-06-02

## 2023-05-26 NOTE — Telephone Encounter (Signed)
Please advise KH 

## 2023-05-28 ENCOUNTER — Telehealth: Payer: Self-pay | Admitting: *Deleted

## 2023-05-28 NOTE — Transitions of Care (Post Inpatient/ED Visit) (Signed)
05/28/2023  Name: Molly Gutierrez MRN: [id number] DOB: 1981-01-24  Today's TOC FU Call Status: Today's TOC FU Call Status:: Successful TOC FU Call Completed TOC FU Call Complete Date: 05/28/23 Patient's Name and Date of Birth confirmed.  Transition Care Management Follow-up Telephone Call Date of Discharge: 05/27/23 Discharge Facility: Other [facility]) Name of Other ([facility]) Discharge Facility: [facility] Type of Discharge: Inpatient Admission Primary Inpatient Discharge Diagnosis:: Sickle Cell Crisis How have you been since you were released from the hospital?: Better Any questions or concerns?: Yes Patient Questions/Concerns:: questions regarding the prescription ordered Patient Questions/Concerns Addressed: Notified Provider of Patient Questions/Concerns  Items Reviewed: Did you receive and understand the discharge instructions provided?: Yes Medications obtained,verified, and reconciled?: No Medications Not Reviewed Reasons:: Notified Provider of Medication Management Concern Any new allergies since your discharge?: No Dietary orders reviewed?: No Do you have support at home?: Yes People in Home: child(ren), dependent Name of Support/Comfort Primary Source: [name] father  Medications Reviewed Today: Medications Reviewed Today     Reviewed by Luella Cook, RN (Case Manager) on 05/28/23 at 1240  Med List Status: <None>   Medication Order Taking? Sig Documenting Provider Last Dose Status Informant  acetaminophen (TYLENOL) 500 MG tablet [id number] Yes Take 500 mg by mouth every 6 (six) hours as needed for mild pain or headache (or cramps). [provider] Taking Active Self, Pharmacy Records  albuterol (VENTOLIN HFA) 108 (90 Base) MCG/ACT inhaler [id number] Yes TAKE 2 PUFFS BY MOUTH EVERY 6 HOURS AS NEEDED FOR WHEEZE OR SHORTNESS OF BREATH  Patient taking differently: Inhale 2 puffs into the lungs every 6 (six) hours as  needed for wheezing or shortness of breath.   [name], NP Taking Active Self, Pharmacy Records  budesonide-formoterol [facility]) 80-4.5 MCG/ACT inhaler [id number] Yes INHALE 2 PUFFS INTO THE LUNGS TWICE A DAY  Patient taking differently: Inhale 2 puffs into the lungs 2 (two) times daily.   [name], NP Taking Active Self, Pharmacy Records  Cholecalciferol (VITAMIN D3 PO) [id number] Yes Take 1 capsule by mouth at bedtime. [provider] Taking Active Self, Pharmacy Records           Med Note Antony Madura, Linna Darner Mar 04, 2023  5:51 PM) Strength not noted  Cyanocobalamin (VITAMIN B-12 PO) [id number] Yes Take 1 tablet by mouth at bedtime. [provider] Taking Active Self, Pharmacy Records           Med Note Antony Madura, Linna Darner Mar 04, 2023  5:51 PM) Strength not noted  Deferiprone (FERRIPROX) 500 MG TABS [id number] Yes Take 500 mg by mouth 2 (two) times daily. Massie Maroon, FNP Taking Active   diphenhydrAMINE (BENADRYL) 25 MG tablet [id number] Yes Take 25 mg by mouth See admin instructions. Take 25 mg by mouth every four to six hours with each dose of Hydromorphone [provider] Taking Active Self, Pharmacy Records           Med Note [name], REGEENA   Thu Oct 05, 2020  5:11 PM)    fluticasone (FLONASE) 50 MCG/ACT nasal spray [id number]  Place 1 spray into both nostrils daily as needed for allergies or rhinitis. [name], NP  Expired 05/05/23 2359 Self, Pharmacy Records  folic acid (FOLVITE) 1 MG tablet [id number] Yes Take 1 tablet (1 mg total) by mouth daily. [name], NP Taking Active Self, Pharmacy Records  HYDROmorphone (DILAUDID) 4 MG tablet [id number] Yes Take 1  tablet (4 mg total) by mouth every 6 (six) hours as needed for up to 15 days for severe pain (pain score 7-10). [name], NP Taking Active   mirtazapine (REMERON) 45 MG tablet [id number] Yes Take 1 tablet (45 mg total) by mouth at bedtime. [name], NP Taking  Active Self, Pharmacy Records  naloxone [facility]) nasal spray 4 mg/0.1 mL [id number] Yes Place 0.1 sprays (0.4 mg total) into the nose once as needed (opioid overdose). [name], NP Taking Active Self, Pharmacy Records  polyethylene glycol (MIRALAX / GLYCOLAX) 17 g packet [id number] Yes Take 17 g by mouth daily as needed for mild constipation (MIX AS DIRECTED AND DRINK). [name], NP Taking Active Self, Pharmacy Records  promethazine (PHENERGAN) 25 MG tablet [id number]  Take 0.5-1 tablets (12.5-25 mg total) by mouth every 6 (six) hours as needed for nausea or vomiting. [name], NP  Expired 05/05/23 2359 Self, Pharmacy Records  Vitamin D, Ergocalciferol, (DRISDOL) 1.25 MG (50000 UNIT) CAPS capsule [id number] Yes Take 1 capsule (50,000 Units total) by mouth once a week.  Patient taking differently: Take 50,000 Units by mouth every Wednesday.   [name], NP Taking Active Self, Pharmacy Records  XARELTO 20 MG TABS tablet [id number]  Take 1 tablet (20 mg total) by mouth daily with supper.  Patient taking differently: Take 20 mg by mouth at bedtime.   [name], NP  Expired 05/05/23 2359 Self, Pharmacy Records           Med Note Malachy Moan Mar 04, 2023  5:26 PM)    Med List Note Salvatore Marvel, [place] 04/02/22 1640): The "k" is in the patient's first name is SILENT              Home Care and Equipment/Supplies: Were Home Health Services Ordered?: NA Any new equipment or medical supplies ordered?: NA  Functional Questionnaire: Do you need assistance with bathing/showering or dressing?: No Do you need assistance with meal preparation?: No Do you need assistance with eating?: No Do you have difficulty maintaining continence: No Do you need assistance with getting out of bed/getting out of a chair/moving?: No  Follow up appointments reviewed: PCP Follow-up appointment confirmed?: Yes Date of PCP follow-up appointment?: 06/02/23 Follow-up Provider:  Angus Seller NP Specialist Hospital Follow-up appointment confirmed?: NA Do you need transportation to your follow-up appointment?: No Do you understand care options if your condition(s) worsen?: Yes-patient verbalized understanding  SDOH Interventions Today    Flowsheet Row Most Recent Value  SDOH Interventions   Food Insecurity Interventions Intervention Not Indicated  Housing Interventions Intervention Not Indicated  Utilities Interventions Intervention Not Indicated      Patient had concerns regarding medication filled. Concerns sent to her PCP No further follow up  needed for care coordination RN made patient aware to call [facility] customer service to see if they have a post discharge meal plan available  TOC interventions discussed/reviewed: -Discussed/reviewed insurance/health plans benefits -Doctor visit discussed/reviewed -PCP -Doctor visits discussed/reviewed-Specialist -Provided Verbal Education: 30-day TOC program, nutrition, meds & their functions, symptom mgmt., fall/safety measures in the home   Gean Maidens BSN RN Population Health- Transition of Care Team.  [facility] [phone]

## 2023-05-30 ENCOUNTER — Other Ambulatory Visit: Payer: Self-pay

## 2023-05-30 NOTE — Progress Notes (Signed)
Pharmacy Patient Advocate Encounter   Received notification from Patient Pharmacy that prior authorization for Deferiprone is required/requested.   Insurance verification completed.   The patient is insured through [employer] .   Per test claim: PA required; PA submitted to above mentioned insurance via CoverMyMeds Key/confirmation #/EOC [id number] Status is pending

## 2023-05-30 NOTE — Progress Notes (Signed)
Pharmacy Patient Advocate Encounter  Received notification from [facility] that Prior Authorization for Deferiprone has been APPROVED from 06/17/22 to 05/28/24   PA #/Case ID/Reference #: [id number]

## 2023-06-02 ENCOUNTER — Encounter: Payer: Self-pay | Admitting: Nurse Practitioner

## 2023-06-02 ENCOUNTER — Other Ambulatory Visit: Payer: Self-pay

## 2023-06-02 ENCOUNTER — Ambulatory Visit (INDEPENDENT_AMBULATORY_CARE_PROVIDER_SITE_OTHER): Payer: Medicare HMO | Admitting: Nurse Practitioner

## 2023-06-02 ENCOUNTER — Other Ambulatory Visit (HOSPITAL_COMMUNITY): Payer: Self-pay

## 2023-06-02 VITALS — BP 109/83 | HR 106 | Temp 97.5°F | Wt 117.0 lb

## 2023-06-02 DIAGNOSIS — D5700 Hb-SS disease with crisis, unspecified: Secondary | ICD-10-CM | POA: Diagnosis not present

## 2023-06-02 DIAGNOSIS — R0789 Other chest pain: Secondary | ICD-10-CM | POA: Diagnosis not present

## 2023-06-02 DIAGNOSIS — R079 Chest pain, unspecified: Secondary | ICD-10-CM | POA: Diagnosis not present

## 2023-06-02 MED ORDER — HYDROMORPHONE HCL 4 MG PO TABS
4.0000 mg | ORAL_TABLET | Freq: Four times a day (QID) | ORAL | 0 refills | Status: DC | PRN
  Filled 2023-06-02 – 2023-06-04 (×5): qty 60, 15d supply, fill #0

## 2023-06-02 MED ORDER — DEFERASIROX 360 MG PO TABS
3.0000 | ORAL_TABLET | Freq: Every day | ORAL | 2 refills | Status: DC
  Filled 2023-06-02 – 2023-06-03 (×2): qty 90, 30d supply, fill #0

## 2023-06-02 NOTE — Patient Instructions (Signed)
1. Hb-SS disease with crisis, unspecified (HCC) (Primary)  - Ambulatory referral to Hematology / Oncology  2. Sickle cell pain crisis (HCC)  - HYDROmorphone (DILAUDID) 4 MG tablet; Take 1 tablet (4 mg total) by mouth every 6 (six) hours as needed for up to 15 days for severe pain (pain score 7-10).  Dispense: 60 tablet; Refill: 0  Follow up:  Follow up in 4 weeks

## 2023-06-02 NOTE — Progress Notes (Signed)
Subjective   Patient ID: Molly Gutierrez, female    DOB: 28-Apr-1981, 42 y.o.   MRN: [id number]  Chief Complaint  Patient presents with   Follow-up    Referring provider: [name], NP  [facility] is a 42 y.o. female with Past Medical History: No date: Asthma No date: Eczema No date: History of pulmonary embolus (PE) No date: Sickle cell anemia (HCC)   HPI  Patient presents today for sickle cell follow-up. Patient was in the hospital recently for sickle cell crisis.  She has had 4 crises in the past month requiring treatment through day hospital or ED visit. Patient states that she is still currently having crisis with pain in legs. We discussed that she can be triaged this morning in the day hospital. Patient does need refills on her medications.  Denies f/c/s, n/v/d, hemoptysis, PND, leg swelling. Denies chest pain or edema.   Note: PDMP reviewed - patient was unable to get full prescription last time and has had to use more pills due to crisis. Will send new prescription today.   Allergies  Allergen Reactions   Cefaclor Hives, Swelling and Rash   Hydroxyurea Palpitations and Other (See Comments)    Lowers "blood levels" and heart rate (causes HYPOtension); "it messes me up, it drops my levels and stuff" and Hypotension    Omeprazole Other (See Comments)    Causes "sharp pains in the stomach"   Ketamine Palpitations and Other (See Comments)    "Pt states she has had previous reaction to ketamine. States she becomes flushed, heart races, dizzy, and feels like she is going to pass out."    Immunization History  Administered Date(s) Administered   Influenza, Seasonal, Injecte, Preservative Fre 03/05/2023   Pfizer Covid-19 Vaccine Bivalent Booster 60yrs & up 09/25/2019, 10/16/2019   Tdap 07/09/2019    Tobacco History: Social History   Tobacco Use  Smoking Status Never  Smokeless Tobacco Never   Counseling given: Not Answered   Outpatient  Encounter Medications as of 06/02/2023  Medication Sig   acetaminophen (TYLENOL) 500 MG tablet Take 500 mg by mouth every 6 (six) hours as needed for mild pain or headache (or cramps).   albuterol (VENTOLIN HFA) 108 (90 Base) MCG/ACT inhaler TAKE 2 PUFFS BY MOUTH EVERY 6 HOURS AS NEEDED FOR WHEEZE OR SHORTNESS OF BREATH (Patient taking differently: Inhale 2 puffs into the lungs every 6 (six) hours as needed for wheezing or shortness of breath.)   budesonide-formoterol (SYMBICORT) 80-4.5 MCG/ACT inhaler INHALE 2 PUFFS INTO THE LUNGS TWICE A DAY (Patient taking differently: Inhale 2 puffs into the lungs 2 (two) times daily.)   Cholecalciferol (VITAMIN D3 PO) Take 1 capsule by mouth at bedtime.   Cyanocobalamin (VITAMIN B-12 PO) Take 1 tablet by mouth at bedtime.   Deferasirox (JADENU) 360 MG TABS Take 3 tablets (1,080 mg total) by mouth daily.   diphenhydrAMINE (BENADRYL) 25 MG tablet Take 25 mg by mouth See admin instructions. Take 25 mg by mouth every four to six hours with each dose of Hydromorphone   folic acid (FOLVITE) 1 MG tablet Take 1 tablet (1 mg total) by mouth daily.   mirtazapine (REMERON) 45 MG tablet Take 1 tablet (45 mg total) by mouth at bedtime.   polyethylene glycol (MIRALAX / GLYCOLAX) 17 g packet Take 17 g by mouth daily as needed for mild constipation (MIX AS DIRECTED AND DRINK).   promethazine (PHENERGAN) 25 MG tablet Take 0.5-1 tablets (12.5-25 mg total) by mouth every  6 (six) hours as needed for nausea or vomiting.   Vitamin D, Ergocalciferol, (DRISDOL) 1.25 MG (50000 UNIT) CAPS capsule Take 1 capsule (50,000 Units total) by mouth once a week. (Patient taking differently: Take 50,000 Units by mouth every Wednesday.)   XARELTO 20 MG TABS tablet Take 1 tablet (20 mg total) by mouth daily with supper. (Patient taking differently: Take 20 mg by mouth at bedtime.)   [DISCONTINUED] HYDROmorphone (DILAUDID) 4 MG tablet Take 1 tablet (4 mg total) by mouth every 6 (six) hours as needed  for up to 15 days for severe pain (pain score 7-10).   Deferiprone (FERRIPROX) 500 MG TABS Take 500 mg by mouth 2 (two) times daily. (Patient not taking: Reported on 06/02/2023)   fluticasone (FLONASE) 50 MCG/ACT nasal spray Place 1 spray into both nostrils daily as needed for allergies or rhinitis.   HYDROmorphone (DILAUDID) 4 MG tablet Take 1 tablet (4 mg total) by mouth every 6 (six) hours as needed for up to 15 days for severe pain (pain score 7-10).   naloxone (NARCAN) nasal spray 4 mg/0.1 mL Place 0.1 sprays (0.4 mg total) into the nose once as needed (opioid overdose). (Patient not taking: Reported on 06/02/2023)   No facility-administered encounter medications on file as of 06/02/2023.    Review of Systems  Review of Systems  Constitutional: Negative.   HENT: Negative.    Cardiovascular: Negative.   Gastrointestinal: Negative.   Allergic/Immunologic: Negative.   Neurological: Negative.   Psychiatric/Behavioral: Negative.       Objective:   BP 109/83   Pulse (!) 106   Temp (!) 97.5 F (36.4 C)   Wt 117 lb (53.1 kg)   LMP 04/14/2023   SpO2 96%   BMI 20.73 kg/m   Wt Readings from Last 5 Encounters:  06/02/23 117 lb (53.1 kg)  05/09/23 126 lb 8.7 oz (57.4 kg)  05/05/23 117 lb (53.1 kg)  04/22/23 111 lb 1.8 oz (50.4 kg)  03/04/23 111 lb 1.8 oz (50.4 kg)     Physical Exam Vitals and nursing note reviewed.  Constitutional:      General: She is not in acute distress.    Appearance: She is well-developed.  Cardiovascular:     Rate and Rhythm: Normal rate and regular rhythm.  Pulmonary:     Effort: Pulmonary effort is normal.     Breath sounds: Normal breath sounds.  Neurological:     Mental Status: She is alert and oriented to person, place, and time.       Assessment & Plan:   Hb-SS disease with crisis, unspecified (HCC) -     Ambulatory referral to Hematology / Oncology  Sickle cell pain crisis (HCC) -     HYDROmorphone HCl; Take 1 tablet (4 mg total)  by mouth every 6 (six) hours as needed for up to 15 days for severe pain (pain score 7-10).  Dispense: 60 tablet; Refill: 0  Other orders -     Deferasirox; Take 3 tablets (1,080 mg total) by mouth daily.  Dispense: 90 tablet; Refill: 2     Return in about 4 weeks (around 06/30/2023) for follow up.   [name], NP 06/02/2023

## 2023-06-03 ENCOUNTER — Other Ambulatory Visit: Payer: Self-pay

## 2023-06-03 ENCOUNTER — Other Ambulatory Visit (HOSPITAL_COMMUNITY): Payer: Self-pay

## 2023-06-03 DIAGNOSIS — R0789 Other chest pain: Secondary | ICD-10-CM | POA: Diagnosis not present

## 2023-06-03 DIAGNOSIS — Z86718 Personal history of other venous thrombosis and embolism: Secondary | ICD-10-CM | POA: Diagnosis not present

## 2023-06-03 DIAGNOSIS — K5900 Constipation, unspecified: Secondary | ICD-10-CM | POA: Diagnosis not present

## 2023-06-03 DIAGNOSIS — Z841 Family history of disorders of kidney and ureter: Secondary | ICD-10-CM | POA: Diagnosis not present

## 2023-06-03 DIAGNOSIS — R079 Chest pain, unspecified: Secondary | ICD-10-CM | POA: Diagnosis not present

## 2023-06-03 DIAGNOSIS — Z9049 Acquired absence of other specified parts of digestive tract: Secondary | ICD-10-CM | POA: Diagnosis not present

## 2023-06-03 DIAGNOSIS — D5700 Hb-SS disease with crisis, unspecified: Secondary | ICD-10-CM | POA: Diagnosis not present

## 2023-06-03 DIAGNOSIS — Z8249 Family history of ischemic heart disease and other diseases of the circulatory system: Secondary | ICD-10-CM | POA: Diagnosis not present

## 2023-06-03 DIAGNOSIS — D649 Anemia, unspecified: Secondary | ICD-10-CM | POA: Diagnosis not present

## 2023-06-03 DIAGNOSIS — J4520 Mild intermittent asthma, uncomplicated: Secondary | ICD-10-CM | POA: Diagnosis not present

## 2023-06-03 DIAGNOSIS — R0602 Shortness of breath: Secondary | ICD-10-CM | POA: Diagnosis not present

## 2023-06-03 DIAGNOSIS — Z888 Allergy status to other drugs, medicaments and biological substances status: Secondary | ICD-10-CM | POA: Diagnosis not present

## 2023-06-03 DIAGNOSIS — J45909 Unspecified asthma, uncomplicated: Secondary | ICD-10-CM | POA: Diagnosis not present

## 2023-06-03 DIAGNOSIS — Z833 Family history of diabetes mellitus: Secondary | ICD-10-CM | POA: Diagnosis not present

## 2023-06-03 DIAGNOSIS — Z96649 Presence of unspecified artificial hip joint: Secondary | ICD-10-CM | POA: Diagnosis not present

## 2023-06-04 ENCOUNTER — Encounter (HOSPITAL_COMMUNITY): Payer: Self-pay

## 2023-06-04 ENCOUNTER — Other Ambulatory Visit: Payer: Self-pay

## 2023-06-04 ENCOUNTER — Telehealth: Payer: Self-pay

## 2023-06-04 ENCOUNTER — Other Ambulatory Visit (HOSPITAL_COMMUNITY): Payer: Self-pay

## 2023-06-04 NOTE — Progress Notes (Signed)
Specialty Pharmacy Initial Fill Coordination Note  Molly Gutierrez is a 42 y.o. female contacted today regarding initial fill of specialty medication(s) Deferiprone   Patient requested Delivery   Delivery date: 06/06/23   Verified address: [address]   Medication will be filled on 12/19.   Patient is aware of $0 copayment.

## 2023-06-04 NOTE — Transitions of Care (Post Inpatient/ED Visit) (Signed)
06/04/2023  Name: Molly Gutierrez MRN: [id number] DOB: [birth year]  Today's TOC FU Call Status: Today's TOC FU Call Status:: Successful TOC FU Call Completed TOC FU Call Complete Date: 06/04/23 Patient's Name and Date of Birth confirmed.  Transition Care Management Follow-up Telephone Call Date of Discharge: 06/03/23 Discharge Facility: Other ([facility]) Name of Other ([facility]) [facility]: [facility] Type of Discharge: Emergency Department Reason for ED Visit: Other: (HbSS) How have you been since you were released from the hospital?: Better Any questions or concerns?: No  Items Reviewed: Did you receive and understand the discharge instructions provided?: Yes Medications obtained,verified, and reconciled?: Yes (Medications Reviewed) Any new allergies since your discharge?: No Dietary orders reviewed?: Yes Do you have support at home?: No  Medications Reviewed Today: Medications Reviewed Today     Reviewed by Karena Addison, LPN (Licensed Practical Nurse) on 06/04/23 at 1458  Med List Status: <None>   Medication Order Taking? Sig Documenting Provider Last Dose Status Informant  acetaminophen (TYLENOL) 500 MG tablet [id number] No Take 500 mg by mouth every 6 (six) hours as needed for mild pain or headache (or cramps). [provider] Taking Active Self, Pharmacy Records  albuterol (VENTOLIN HFA) 108 (90 Base) MCG/ACT inhaler [id number] No TAKE 2 PUFFS BY MOUTH EVERY 6 HOURS AS NEEDED FOR WHEEZE OR SHORTNESS OF BREATH  Patient taking differently: Inhale 2 puffs into the lungs every 6 (six) hours as needed for wheezing or shortness of breath.   [name], NP Taking Active Self, Pharmacy Records  budesonide-formoterol [facility]) 80-4.5 MCG/ACT inhaler [id number] No INHALE 2 PUFFS INTO THE LUNGS TWICE A DAY  Patient taking differently: Inhale 2 puffs into the lungs 2 (two) times daily.   [name], NP Taking Active Self, Pharmacy Records   Cholecalciferol (VITAMIN D3 PO) [id number] No Take 1 capsule by mouth at bedtime. [provider] Taking Active Self, Pharmacy Records           Med Note Antony Madura, Linna Darner Mar 04, 2023  5:51 PM) Strength not noted  Cyanocobalamin (VITAMIN B-12 PO) [id number] No Take 1 tablet by mouth at bedtime. [provider] Taking Active Self, Pharmacy Records           Med Note Antony Madura, Arn Medal   Tue Mar 04, 2023  5:51 PM) Strength not noted  Deferasirox (JADENU) 360 MG TABS [id number]  Take 3 tablets (1,080 mg total) by mouth daily. [name], NP  Active   Deferiprone (FERRIPROX) 500 MG TABS [id number] No Take 500 mg by mouth 2 (two) times daily.  Patient not taking: Reported on 06/02/2023   Massie Maroon, FNP Not Taking Active   diphenhydrAMINE (BENADRYL) 25 MG tablet [id number] No Take 25 mg by mouth See admin instructions. Take 25 mg by mouth every four to six hours with each dose of Hydromorphone [provider] Taking Active Self, Pharmacy Records           Med Note [name], REGEENA   Thu Oct 05, 2020  5:11 PM)    fluticasone (FLONASE) 50 MCG/ACT nasal spray [id number] No Place 1 spray into both nostrils daily as needed for allergies or rhinitis. [name], NP unknown Expired 05/05/23 2359 Self, Pharmacy Records  folic acid (FOLVITE) 1 MG tablet [id number] No Take 1 tablet (1 mg total) by mouth daily. [name], NP Taking Active Self, Pharmacy Records  HYDROmorphone (DILAUDID) 4 MG tablet [id number]  Take 1 tablet (4 mg total) by  mouth every 6 (six) hours as needed for up to 15 days for severe pain (pain score 7-10). [name], NP  Active   mirtazapine (REMERON) 45 MG tablet [id number] No Take 1 tablet (45 mg total) by mouth at bedtime. [name], NP Taking Active Self, Pharmacy Records  naloxone [facility]) nasal spray 4 mg/0.1 mL [id number] No Place 0.1 sprays (0.4 mg total) into the nose once as needed (opioid overdose).  Patient not taking:  Reported on 06/02/2023   [name], NP Not Taking Active Self, Pharmacy Records  polyethylene glycol (MIRALAX / GLYCOLAX) 17 g packet [id number] No Take 17 g by mouth daily as needed for mild constipation (MIX AS DIRECTED AND DRINK). [name], NP Taking Active Self, Pharmacy Records  promethazine (PHENERGAN) 25 MG tablet [id number] No Take 0.5-1 tablets (12.5-25 mg total) by mouth every 6 (six) hours as needed for nausea or vomiting. [name], NP Taking Expired 06/02/23 2359 Self, Pharmacy Records  Vitamin D, Ergocalciferol, (DRISDOL) 1.25 MG (50000 UNIT) CAPS capsule [id number] No Take 1 capsule (50,000 Units total) by mouth once a week.  Patient taking differently: Take 50,000 Units by mouth every Wednesday.   [name], NP Taking Active Self, Pharmacy Records  XARELTO 20 MG TABS tablet [id number] No Take 1 tablet (20 mg total) by mouth daily with supper.  Patient taking differently: Take 20 mg by mouth at bedtime.   [name], NP Taking Expired 06/02/23 2359 Self, Pharmacy Records           Med Note Malachy Moan Mar 04, 2023  5:26 PM)    Med List Note Salvatore Marvel, [place] 04/02/22 1640): The "k" is in the patient's first name is SILENT              Home Care and Equipment/Supplies: Were Home Health Services Ordered?: NA Any new equipment or medical supplies ordered?: NA  Functional Questionnaire: Do you need assistance with bathing/showering or dressing?: No Do you need assistance with meal preparation?: No Do you need assistance with eating?: No Do you have difficulty maintaining continence: No Do you need assistance with getting out of bed/getting out of a chair/moving?: No Do you have difficulty managing or taking your medications?: No  Follow up appointments reviewed: PCP Follow-up appointment confirmed?: No (declined) MD Provider Line Number:[phone] Given: No Specialist Hospital Follow-up appointment confirmed?: NA Do you need  transportation to your follow-up appointment?: No Do you understand care options if your condition(s) worsen?: Yes-patient verbalized understanding    SIGNATURE Karena Addison, LPN [facility] Nurse Health Advisor Direct Dial [phone]

## 2023-06-04 NOTE — Progress Notes (Signed)
Specialty Pharmacy Initiation Note   Molly Gutierrez is a 42 y.o. female who will be followed by the specialty pharmacy service for RxSp Sickle Cell Disease    Review of administration, indication, effectiveness, safety, potential side effects, storage/disposable, and missed dose instructions occurred today for patient's specialty medication(s) Deferiprone     Patient/Caregiver did not have any additional questions or concerns.   Patient's therapy is appropriate to: Initiate    Goals Addressed             This Visit's Progress    Stabilization of disease       Patient is initiating therapy. Patient will maintain adherence and adhere to provider and/or lab appointments         Otto Herb Specialty Pharmacist

## 2023-06-05 ENCOUNTER — Other Ambulatory Visit: Payer: Self-pay

## 2023-06-17 ENCOUNTER — Telehealth: Payer: Self-pay

## 2023-06-17 ENCOUNTER — Other Ambulatory Visit: Payer: Self-pay | Admitting: Nurse Practitioner

## 2023-06-17 DIAGNOSIS — D5700 Hb-SS disease with crisis, unspecified: Secondary | ICD-10-CM

## 2023-06-17 NOTE — Transitions of Care (Post Inpatient/ED Visit) (Signed)
   06/17/2023  Name: Molly Gutierrez  MRN: [id number] DOB: 12-27-1980  Today's TOC FU Call Status: Today's TOC FU Call Status:: Unsuccessful Call (1st Attempt) Unsuccessful Call (1st Attempt) Date: 06/17/23  Attempted to reach the patient regarding the most recent Inpatient/ED visit.  Follow Up Plan: Additional outreach attempts will be made to reach the patient to complete the Transitions of Care (Post Inpatient/ED visit) call.   Cherice Glennie Gladis BSN, RN RN Care Manager   Transitions of Care [facility] Direct Dial Number:  [phone]

## 2023-06-19 ENCOUNTER — Telehealth: Payer: Self-pay

## 2023-06-19 ENCOUNTER — Other Ambulatory Visit: Payer: Self-pay

## 2023-06-19 ENCOUNTER — Other Ambulatory Visit (HOSPITAL_COMMUNITY): Payer: Self-pay

## 2023-06-19 MED ORDER — HYDROMORPHONE HCL 4 MG PO TABS
4.0000 mg | ORAL_TABLET | Freq: Four times a day (QID) | ORAL | 0 refills | Status: DC | PRN
  Filled 2023-06-19: qty 60, 15d supply, fill #0

## 2023-06-19 NOTE — Transitions of Care (Post Inpatient/ED Visit) (Signed)
 06/19/2023  Name: Molly Gutierrez  MRN: [id number] DOB: 11/09/1980  Today's TOC FU Call Status: Today's TOC FU Call Status:: Successful TOC FU Call Completed TOC FU Call Complete Date: 06/19/23 Patient's Name and Date of Birth confirmed.  Transition Care Management Follow-up Telephone Call Name of Other ([facility]) Discharge Facility: [facility] Primary Inpatient Discharge Diagnosis:: Sickle cell crisis How have you been since you were released from the hospital?: Better Any questions or concerns?: No  Items Reviewed: Did you receive and understand the discharge instructions provided?: Yes Medications obtained,verified, and reconciled?: Yes (Medications Reviewed) Any new allergies since your discharge?: No Dietary orders reviewed?: No Do you have support at home?: Yes Name of Support/Comfort Primary Source: family  Medications Reviewed Today: Medications Reviewed Today     Reviewed by Rumalda Alan PENNER, RN (Registered Nurse) on 06/19/23 at 1421  Med List Status: <None>   Medication Order Taking? Sig Documenting Provider Last Dose Status Informant  acetaminophen  (TYLENOL ) 500 MG tablet [id number] Yes Take 500 mg by mouth every 6 (six) hours as needed for mild pain or headache (or cramps). [provider] Taking Active Self, Pharmacy Records  albuterol  (VENTOLIN  HFA) 108 (90 Base) MCG/ACT inhaler [id number] Yes TAKE 2 PUFFS BY MOUTH EVERY 6 HOURS AS NEEDED FOR WHEEZE OR SHORTNESS OF BREATH  Patient taking differently: Inhale 2 puffs into the lungs every 6 (six) hours as needed for wheezing or shortness of breath.   Oley Bascom RAMAN, NP Taking Active Self, Pharmacy Records  budesonide -formoterol  (SYMBICORT ) 80-4.5 MCG/ACT inhaler [id number] Yes INHALE 2 PUFFS INTO THE LUNGS TWICE A DAY  Patient taking differently: Inhale 2 puffs into the lungs 2 (two) times daily.   Oley Bascom RAMAN, NP Taking Active Self, Pharmacy Records  Cholecalciferol  (VITAMIN D3 PO) [id number] Yes Take  1 capsule by mouth at bedtime. [provider] Taking Active Self, Pharmacy Records           Med Note MARISA, NATHANEL LOISE Debar Mar 04, 2023  5:51 PM) Strength not noted  Cyanocobalamin  (VITAMIN B-12 PO) [id number] Yes Take 1 tablet by mouth at bedtime. [provider] Taking Active Self, Pharmacy Records           Med Note MARISA, NATHANEL LOISE Debar Mar 04, 2023  5:51 PM) Strength not noted  Deferasirox  (JADENU ) 360 MG TABS [id number] Yes Take 3 tablets (1,080 mg total) by mouth daily. Oley Bascom RAMAN, NP Taking Active   Deferiprone  (FERRIPROX ) 500 MG TABS [id number] Yes Take 500 mg by mouth 2 (two) times daily. Tilford Bertram HERO, FNP Taking Active   diphenhydrAMINE  (BENADRYL ) 25 MG tablet [id number] Yes Take 25 mg by mouth See admin instructions. Take 25 mg by mouth every four to six hours with each dose of Hydromorphone  [provider] Taking Active Self, Pharmacy Records           Med Note DINO, REGEENA   Thu Oct 05, 2020  5:11 PM)    fluticasone  (FLONASE ) 50 MCG/ACT nasal spray [id number]  Place 1 spray into both nostrils daily as needed for allergies or rhinitis. Oley Bascom RAMAN, NP  Expired 05/05/23 2359 Self, Pharmacy Records  folic acid  (FOLVITE ) 1 MG tablet [id number] Yes Take 1 tablet (1 mg total) by mouth daily. Oley Bascom RAMAN, NP Taking Active Self, Pharmacy Records  HYDROmorphone  (DILAUDID ) 4 MG tablet [id number] Yes Take 1 tablet (4 mg total) by mouth every 6 (six) hours as needed for up to 15 days for severe pain (pain  score 7-10). Oley Bascom RAMAN, NP Taking Active   mirtazapine  (REMERON ) 45 MG tablet [id number] Yes Take 1 tablet (45 mg total) by mouth at bedtime. Oley Bascom RAMAN, NP Taking Active Self, Pharmacy Records  naloxone  (NARCAN ) nasal spray 4 mg/0.1 mL [id number] No Place 0.1 sprays (0.4 mg total) into the nose once as needed (opioid overdose).  Patient not taking: Reported on 06/02/2023   Oley Bascom RAMAN, NP Not Taking Active Self, Pharmacy Records   polyethylene glycol (MIRALAX  / GLYCOLAX ) 17 g packet [id number] Yes Take 17 g by mouth daily as needed for mild constipation (MIX AS DIRECTED AND DRINK). Oley Bascom RAMAN, NP Taking Active Self, Pharmacy Records  promethazine  (PHENERGAN ) 25 MG tablet [id number]  Take 0.5-1 tablets (12.5-25 mg total) by mouth every 6 (six) hours as needed for nausea or vomiting. Oley Bascom RAMAN, NP  Expired 06/02/23 2359 Self, Pharmacy Records  Vitamin D , Ergocalciferol , (DRISDOL ) 1.25 MG (50000 UNIT) CAPS capsule [id number] Yes Take 1 capsule (50,000 Units total) by mouth once a week.  Patient taking differently: Take 50,000 Units by mouth every Wednesday.   Oley Bascom RAMAN, NP Taking Active Self, Pharmacy Records  XARELTO  20 MG TABS tablet [id number]  Take 1 tablet (20 mg total) by mouth daily with supper.  Patient taking differently: Take 20 mg by mouth at bedtime.   Oley Bascom RAMAN, NP  Expired 06/02/23 2359 Self, Pharmacy Records           Med Note (ROSE, Hoke Baer U   Thu Jun 19, 2023  2:21 PM) Still on this medication  Med List Note Marisa Nathanel SAILOR, CPhT 04/02/22 1640): The k is in the patient's first name is SILENT              Home Care and Equipment/Supplies: Were Home Health Services Ordered?: No Any new equipment or medical supplies ordered?: No  Functional Questionnaire: Do you need assistance with bathing/showering or dressing?: No Do you need assistance with meal preparation?: No Do you need assistance with eating?: No Do you have difficulty maintaining continence: No Do you need assistance with getting out of bed/getting out of a chair/moving?: No Do you have difficulty managing or taking your medications?: No  Follow up appointments reviewed: PCP Follow-up appointment confirmed?: Yes MD Provider Line [phone] Given: No Date of PCP follow-up appointment?: 07/03/23 Follow-up Provider: PCP Specialist Hospital Follow-up appointment confirmed?: NA Do you need transportation to  your follow-up appointment?: No Do you understand care options if your condition(s) worsen?: Yes-patient verbalized understanding  SDOH Interventions Today    Flowsheet Row Most Recent Value  SDOH Interventions   Food Insecurity Interventions Intervention Not Indicated  Housing Interventions Intervention Not Indicated  Transportation Interventions Intervention Not Indicated  Utilities Interventions Intervention Not Indicated     Reviewed discharge instructions and all medications with patient. Patient denies pain or any other concerns today, Reports that she has all her medications and is taking them as prescribed.   Offered 30 day TOC program and patient declined.   Interventions Today    Flowsheet Row Most Recent Value  Chronic Disease   Chronic disease during today's visit Other  [sickle cell crisis]  Pharmacy Interventions   Pharmacy Dicussed/Reviewed Medications and their functions, Pharmacy Topics Discussed        Alan Ee, RN, BSN, CEN Population Health- Transition of Care Team.  [facility] [phone]

## 2023-06-25 ENCOUNTER — Other Ambulatory Visit (HOSPITAL_COMMUNITY): Payer: Self-pay | Admitting: Pharmacy Technician

## 2023-06-25 ENCOUNTER — Other Ambulatory Visit (HOSPITAL_COMMUNITY): Payer: Self-pay

## 2023-06-25 NOTE — Progress Notes (Signed)
 Specialty Pharmacy Refill Coordination Note  Molly Gutierrez  is a 43 y.o. female contacted today regarding refills of specialty medication(s) Deferiprone    Patient requested Delivery   Delivery date: 07/04/23   Verified address: [address] [name]   Medication will be filled on 07/03/23.

## 2023-06-28 DIAGNOSIS — Z7951 Long term (current) use of inhaled steroids: Secondary | ICD-10-CM | POA: Diagnosis not present

## 2023-06-28 DIAGNOSIS — E876 Hypokalemia: Secondary | ICD-10-CM | POA: Diagnosis not present

## 2023-06-28 DIAGNOSIS — Z86711 Personal history of pulmonary embolism: Secondary | ICD-10-CM | POA: Diagnosis not present

## 2023-06-28 DIAGNOSIS — D75839 Thrombocytosis, unspecified: Secondary | ICD-10-CM | POA: Diagnosis not present

## 2023-06-28 DIAGNOSIS — R0789 Other chest pain: Secondary | ICD-10-CM | POA: Diagnosis not present

## 2023-06-28 DIAGNOSIS — D5700 Hb-SS disease with crisis, unspecified: Secondary | ICD-10-CM | POA: Diagnosis not present

## 2023-06-28 DIAGNOSIS — D693 Immune thrombocytopenic purpura: Secondary | ICD-10-CM | POA: Diagnosis not present

## 2023-06-28 DIAGNOSIS — R079 Chest pain, unspecified: Secondary | ICD-10-CM | POA: Diagnosis not present

## 2023-06-28 DIAGNOSIS — Z7901 Long term (current) use of anticoagulants: Secondary | ICD-10-CM | POA: Diagnosis not present

## 2023-06-29 DIAGNOSIS — D5700 Hb-SS disease with crisis, unspecified: Secondary | ICD-10-CM | POA: Diagnosis not present

## 2023-07-02 ENCOUNTER — Other Ambulatory Visit: Payer: Self-pay | Admitting: Nurse Practitioner

## 2023-07-02 ENCOUNTER — Other Ambulatory Visit (HOSPITAL_COMMUNITY): Payer: Self-pay

## 2023-07-02 DIAGNOSIS — D5700 Hb-SS disease with crisis, unspecified: Secondary | ICD-10-CM

## 2023-07-02 MED ORDER — HYDROMORPHONE HCL 4 MG PO TABS
4.0000 mg | ORAL_TABLET | Freq: Four times a day (QID) | ORAL | 0 refills | Status: DC | PRN
  Filled 2023-07-03 – 2023-07-08 (×3): qty 60, 15d supply, fill #0

## 2023-07-03 ENCOUNTER — Other Ambulatory Visit: Payer: Self-pay

## 2023-07-03 ENCOUNTER — Ambulatory Visit: Payer: Self-pay | Admitting: Nurse Practitioner

## 2023-07-03 ENCOUNTER — Other Ambulatory Visit (HOSPITAL_COMMUNITY): Payer: Self-pay

## 2023-07-03 ENCOUNTER — Telehealth: Payer: Self-pay

## 2023-07-03 NOTE — Transitions of Care (Post Inpatient/ED Visit) (Signed)
   07/03/2023  Name: Molly Gutierrez MRN: [id number] DOB: Feb 26, 1981  Today's TOC FU Call Status: Today's TOC FU Call Status:: Unsuccessful Call (1st Attempt) Unsuccessful Call (1st Attempt) Date: 07/03/23  Attempted to reach the patient regarding the most recent Inpatient/ED visit. 30 Day post discharge follow up outreach #1. DC from [facility]. PCP f/u was today, 07/03/23, at 1100.  Follow Up Plan: Additional outreach attempts will be made to reach the patient to complete the Transitions of Care (Post Inpatient/ED visit) call.    Gabriel Cirri MSN, RN Care Coordinator [facility] Office Hours Wed/Thur  8:00 am-6:00 pm Direct Dial: [phone] Main Phone [phone]  Fax: [phone]

## 2023-07-04 ENCOUNTER — Telehealth: Payer: Self-pay

## 2023-07-04 NOTE — Transitions of Care (Post Inpatient/ED Visit) (Signed)
07/04/2023  Name: Molly Gutierrez MRN: [id number] DOB: 09/13/1980  Today's TOC FU Call Status: TOC FU Call Complete Date: 07/04/23 Patient's Name and Date of Birth confirmed.  Transition Care Management Follow-up Telephone Call Date of Discharge: 07/02/23 Name of Other ([facility]) Discharge Facility: [facility] Type of Discharge: Inpatient Admission Primary Inpatient Discharge Diagnosis:: Sickle Cell Crisis - Chest and shoulder pain How have you been since you were released from the hospital?: Better Any questions or concerns?: No  Items Reviewed: Did you receive and understand the discharge instructions provided?: Yes Medications obtained,verified, and reconciled?: Yes (Medications Reviewed) Any new allergies since your discharge?: No Dietary orders reviewed?: No Do you have support at home?: Yes People in Home: parent(s) Name of Support/Comfort Primary Source: Parent, sibling  Medications Reviewed Today: Medications Reviewed Today     Reviewed by Wyline Mood, RN (Case Manager) on 07/04/23 at 1027  Med List Status: <None>   Medication Order Taking? Sig Documenting Provider Last Dose Status Informant  acetaminophen (TYLENOL) 500 MG tablet [id number] Yes Take 500 mg by mouth every 6 (six) hours as needed for mild pain or headache (or cramps). [provider] Taking Active Self, Pharmacy Records  albuterol (VENTOLIN HFA) 108 (90 Base) MCG/ACT inhaler [id number] Yes TAKE 2 PUFFS BY MOUTH EVERY 6 HOURS AS NEEDED FOR WHEEZE OR SHORTNESS OF BREATH  Patient taking differently: Inhale 2 puffs into the lungs every 6 (six) hours as needed for wheezing or shortness of breath.   [name], NP Taking Active Self, Pharmacy Records  budesonide-formoterol [facility]) 80-4.5 MCG/ACT inhaler [id number] Yes INHALE 2 PUFFS INTO THE LUNGS TWICE A DAY  Patient taking differently: Inhale 2 puffs into the lungs 2 (two) times daily.   [name], NP Taking Active Self,  Pharmacy Records  Cholecalciferol (VITAMIN D3 PO) [id number] Yes Take 1 capsule by mouth at bedtime. [provider] Taking Active Self, Pharmacy Records           Med Note Antony Madura, Linna Darner Mar 04, 2023  5:51 PM) Strength not noted  Cyanocobalamin (VITAMIN B-12 PO) [id number] Yes Take 1 tablet by mouth at bedtime. [provider] Taking Active Self, Pharmacy Records           Med Note Antony Madura, Linna Darner Mar 04, 2023  5:51 PM) Strength not noted  Deferasirox (JADENU) 360 MG TABS [id number] Yes Take 3 tablets (1,080 mg total) by mouth daily. [name], NP Taking Active   Deferiprone (FERRIPROX) 500 MG TABS [id number] Yes Take 500 mg by mouth 2 (two) times daily. Massie Maroon, FNP Taking Active   diphenhydrAMINE (BENADRYL) 25 MG tablet [id number] Yes Take 25 mg by mouth See admin instructions. Take 25 mg by mouth every four to six hours with each dose of Hydromorphone [provider] Taking Active Self, Pharmacy Records           Med Note [name], REGEENA   Thu Oct 05, 2020  5:11 PM)    fluticasone (FLONASE) 50 MCG/ACT nasal spray [id number]  Place 1 spray into both nostrils daily as needed for allergies or rhinitis. [name], NP  Expired 05/05/23 2359 Self, Pharmacy Records  folic acid (FOLVITE) 1 MG tablet [id number] Yes Take 1 tablet (1 mg total) by mouth daily. [name], NP Taking Active Self, Pharmacy Records  HYDROmorphone (DILAUDID) 4 MG tablet [id number] Yes Take 1 tablet (4 mg total) by mouth every 6 (six) hours as needed for  up to 15 days for severe pain (pain score 7-10). [name], NP Taking Active   mirtazapine (REMERON) 45 MG tablet [id number] Yes Take 1 tablet (45 mg total) by mouth at bedtime. [name], NP Taking Active Self, Pharmacy Records  naloxone [facility]) nasal spray 4 mg/0.1 mL [id number] Yes Place 0.1 sprays (0.4 mg total) into the nose once as needed (opioid overdose). [name], NP Taking Active Self,  Pharmacy Records  polyethylene glycol (MIRALAX / GLYCOLAX) 17 g packet [id number] Yes Take 17 g by mouth daily as needed for mild constipation (MIX AS DIRECTED AND DRINK). [name], NP Taking Active Self, Pharmacy Records  promethazine (PHENERGAN) 25 MG tablet [id number]  Take 0.5-1 tablets (12.5-25 mg total) by mouth every 6 (six) hours as needed for nausea or vomiting. [name], NP  Expired 06/02/23 2359 Self, Pharmacy Records  Vitamin D, Ergocalciferol, (DRISDOL) 1.25 MG (50000 UNIT) CAPS capsule [id number] Yes Take 1 capsule (50,000 Units total) by mouth once a week.  Patient taking differently: Take 50,000 Units by mouth every Wednesday.   [name], NP Taking Active Self, Pharmacy Records  XARELTO 20 MG TABS tablet [id number]  Take 1 tablet (20 mg total) by mouth daily with supper.  Patient taking differently: Take 20 mg by mouth at bedtime.   [name], NP  Expired 06/02/23 2359 Self, Pharmacy Records           Med Note Wyline Mood   Fri Jul 04, 2023 10:27 AM) Patient reports she continues to take as prescribed  Med List Note Salvatore Marvel, CPhT 04/02/22 1640): The "k" is in the patient's first name is SILENT              Home Care and Equipment/Supplies: Were Home Health Services Ordered?: No Any new equipment or medical supplies ordered?: No  Functional Questionnaire: Do you need assistance with bathing/showering or dressing?: No Do you need assistance with meal preparation?: No Do you need assistance with eating?: No Do you have difficulty maintaining continence: No Do you need assistance with getting out of bed/getting out of a chair/moving?: No Do you have difficulty managing or taking your medications?: No  Follow up appointments reviewed: PCP Follow-up appointment confirmed?: Yes MD Provider Line Number:[phone] Given: No Date of PCP follow-up appointment?: 07/03/23 Follow-up Provider: No Show - Patient to reschedule to PCP within  a week as her next appt isn't until 07/24/23 Specialist Hospital Follow-up appointment confirmed?: NA Do you need transportation to your follow-up appointment?: No Do you understand care options if your condition(s) worsen?: Yes-patient verbalized understanding  SDOH Interventions Today    Flowsheet Row Most Recent Value  SDOH Interventions   Food Insecurity Interventions Intervention Not Indicated  Housing Interventions Intervention Not Indicated  Transportation Interventions Intervention Not Indicated  Utilities Interventions Intervention Not Indicated      Interventions Today    Flowsheet Row Most Recent Value  Chronic Disease   Chronic disease during today's visit Other  [Sickle Cell Disease with crisis]  General Interventions   General Interventions Discussed/Reviewed Doctor Visits  Doctor Visits Discussed/Reviewed Doctor Visits Discussed, Doctor Visits Reviewed, PCP  PCP/Specialist Visits Compliance with follow-up visit  Education Interventions   Education Provided Provided Education  Provided Verbal Education On When to see the doctor, Medication, Insurance Plans  Nutrition Interventions   Nutrition Discussed/Reviewed Fluid intake  Pharmacy Interventions   Pharmacy Dicussed/Reviewed Medications and their functions, Medication Adherence  Safety Interventions   Safety  Discussed/Reviewed Safety Discussed, Safety Reviewed, Fall Risk  [Increased risk for falls due to drowsiness from pain medication]       TOC Interventions Today    Flowsheet Row Most Recent Value  TOC Interventions   TOC Interventions Discussed/Reviewed TOC Interventions Discussed, TOC Interventions Reviewed, S/S of infection, Post discharge activity limitations per provider        Verlinda Slotnick Daphine Deutscher BSN, RN RN Care Manager / Transitions of Care [place] [facility], [facility] Direct Dial Number:  [phone]

## 2023-07-05 ENCOUNTER — Other Ambulatory Visit (HOSPITAL_COMMUNITY): Payer: Self-pay

## 2023-07-06 DIAGNOSIS — Z79899 Other long term (current) drug therapy: Secondary | ICD-10-CM | POA: Diagnosis not present

## 2023-07-06 DIAGNOSIS — D5700 Hb-SS disease with crisis, unspecified: Secondary | ICD-10-CM | POA: Diagnosis not present

## 2023-07-06 DIAGNOSIS — R079 Chest pain, unspecified: Secondary | ICD-10-CM | POA: Diagnosis not present

## 2023-07-06 DIAGNOSIS — R0789 Other chest pain: Secondary | ICD-10-CM | POA: Diagnosis not present

## 2023-07-06 DIAGNOSIS — Z7901 Long term (current) use of anticoagulants: Secondary | ICD-10-CM | POA: Diagnosis not present

## 2023-07-06 DIAGNOSIS — Z0389 Encounter for observation for other suspected diseases and conditions ruled out: Secondary | ICD-10-CM | POA: Diagnosis not present

## 2023-07-06 DIAGNOSIS — Z86711 Personal history of pulmonary embolism: Secondary | ICD-10-CM | POA: Diagnosis not present

## 2023-07-07 ENCOUNTER — Other Ambulatory Visit: Payer: Self-pay

## 2023-07-07 ENCOUNTER — Other Ambulatory Visit (HOSPITAL_COMMUNITY): Payer: Self-pay

## 2023-07-08 ENCOUNTER — Encounter (HOSPITAL_COMMUNITY): Payer: Self-pay | Admitting: *Deleted

## 2023-07-08 ENCOUNTER — Other Ambulatory Visit: Payer: Self-pay

## 2023-07-08 ENCOUNTER — Emergency Department (HOSPITAL_COMMUNITY)
Admission: EM | Admit: 2023-07-08 | Discharge: 2023-07-08 | Disposition: A | Discharge status: Home / Self Care | Payer: Medicare HMO | Attending: Emergency Medicine | Admitting: Emergency Medicine

## 2023-07-08 ENCOUNTER — Other Ambulatory Visit (HOSPITAL_COMMUNITY): Payer: Self-pay

## 2023-07-08 DIAGNOSIS — R079 Chest pain, unspecified: Secondary | ICD-10-CM | POA: Diagnosis present

## 2023-07-08 DIAGNOSIS — R000 Tachycardia, unspecified: Secondary | ICD-10-CM | POA: Diagnosis not present

## 2023-07-08 DIAGNOSIS — D5700 Hb-SS disease with crisis, unspecified: Secondary | ICD-10-CM | POA: Diagnosis not present

## 2023-07-08 DIAGNOSIS — R0789 Other chest pain: Secondary | ICD-10-CM | POA: Diagnosis not present

## 2023-07-08 LAB — COMPREHENSIVE METABOLIC PANEL
ALT: 17 U/L (ref 0–44)
AST: 37 U/L (ref 15–41)
Albumin: 4.6 g/dL (ref 3.5–5.0)
Alkaline Phosphatase: 62 U/L (ref 38–126)
Anion gap: 9 (ref 5–15)
BUN: 7 mg/dL (ref 6–20)
CO2: 21 mmol/L — ABNORMAL LOW (ref 22–32)
Calcium: 9.2 mg/dL (ref 8.9–10.3)
Chloride: 107 mmol/L (ref 98–111)
Creatinine, Ser: 0.4 mg/dL — ABNORMAL LOW (ref 0.44–1.00)
GFR, Estimated: >60 mL/min (ref >60)
Glucose, Bld: 106 mg/dL — ABNORMAL HIGH (ref 70–99)
Potassium: 3.4 mmol/L — ABNORMAL LOW (ref 3.5–5.1)
Sodium: 137 mmol/L (ref 135–145)
Total Bilirubin: 3.9 mg/dL — ABNORMAL HIGH (ref 0.0–1.2)
Total Protein: 8.9 g/dL — ABNORMAL HIGH (ref 6.5–8.1)

## 2023-07-08 LAB — HCG, SERUM, QUALITATIVE: Preg, Serum: NEGATIVE

## 2023-07-08 LAB — CBC WITH DIFFERENTIAL/PLATELET
Abs Immature Granulocytes: 0.09 10*3/uL — ABNORMAL HIGH (ref 0.00–0.07)
Basophils Absolute: 0.1 10*3/uL (ref 0.0–0.1)
Basophils Relative: 0 %
Eosinophils Absolute: 0.2 10*3/uL (ref 0.0–0.5)
Eosinophils Relative: 1 %
HCT: 25.7 % — ABNORMAL LOW (ref 36.0–46.0)
Hemoglobin: 8.7 g/dL — ABNORMAL LOW (ref 12.0–15.0)
Immature Granulocytes: 1 %
Lymphocytes Relative: 16 %
Lymphs Abs: 2.6 10*3/uL (ref 0.7–4.0)
MCH: 29.6 pg (ref 26.0–34.0)
MCHC: 33.9 g/dL (ref 30.0–36.0)
MCV: 87.4 fL (ref 80.0–100.0)
Monocytes Absolute: 1.9 10*3/uL — ABNORMAL HIGH (ref 0.1–1.0)
Monocytes Relative: 12 %
Neutro Abs: 11.8 10*3/uL — ABNORMAL HIGH (ref 1.7–7.7)
Neutrophils Relative %: 70 %
Platelet Morphology: NORMAL
Platelets: 445 10*3/uL — ABNORMAL HIGH (ref 150–400)
RBC: 2.94 MIL/uL — ABNORMAL LOW (ref 3.87–5.11)
RDW: 23.2 % — ABNORMAL HIGH (ref 11.5–15.5)
WBC: 16.6 10*3/uL — ABNORMAL HIGH (ref 4.0–10.5)
nRBC: 0.8 % — ABNORMAL HIGH (ref 0.0–0.2)

## 2023-07-08 LAB — RETICULOCYTES
Immature Retic Fract: 22.4 % — ABNORMAL HIGH (ref 2.3–15.9)
RBC.: 2.95 MIL/uL — ABNORMAL LOW (ref 3.87–5.11)
Retic Count, Absolute: 454.8 10*3/uL — ABNORMAL HIGH (ref 19.0–186.0)
Retic Ct Pct: 15.6 % — ABNORMAL HIGH (ref 0.4–3.1)

## 2023-07-08 MED ORDER — HYDROMORPHONE HCL 2 MG/ML IJ SOLN
2.0000 mg | INTRAMUSCULAR | Status: AC
  Administered 2023-07-08: 2 mg via INTRAVENOUS
  Filled 2023-07-08: qty 1

## 2023-07-08 MED ORDER — HEPARIN SOD (PORK) LOCK FLUSH 100 UNIT/ML IV SOLN
INTRAVENOUS | Status: AC
  Filled 2023-07-08: qty 5

## 2023-07-08 MED ORDER — ONDANSETRON HCL 4 MG/2ML IJ SOLN: 4.0000 mg | INTRAMUSCULAR | Status: DC | PRN

## 2023-07-08 MED ORDER — SODIUM CHLORIDE 0.45 % IV SOLN: INTRAVENOUS | Status: DC

## 2023-07-08 MED ORDER — SODIUM CHLORIDE 0.9 % IV SOLN
12.5000 mg | Freq: Once | INTRAVENOUS | Status: AC
  Administered 2023-07-08: 12.5 mg via INTRAVENOUS
  Filled 2023-07-08: qty 12.5

## 2023-07-08 NOTE — ED Triage Notes (Addendum)
Here by POV from home, here for sickle cell pain crisis, onset yesterday, c/o CP, bilateral leg pain, and nausea. Denies sob or fever. No relief with dilaudid 4mg   PO at home PTA. Alert, NAD, calm, interactive, steady gait. Port in R chest. Rates pain 8/10, was a 9/10.

## 2023-07-08 NOTE — ED Provider Notes (Signed)
[place] EMERGENCY DEPARTMENT AT [facility] Provider Note   CSN: [id number] Arrival date & time: 07/08/23  4098     History  Chief Complaint  Patient presents with   Sickle Cell Pain Crisis    Molly Gutierrez is a 43 y.o. female.  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  Also endorses some nausea.  She denies any shortness of breath or recent fevers, illness.  Denies abdominal pain, headaches.  Denies numbness or weakness in extremities. Reports compliance with Xarelto.  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  She most recently took this around midnight but then was unable to sleep because of pain so she came into the ED.     Sickle Cell Pain Crisis Associated symptoms: chest pain, nausea and vomiting   Associated symptoms: no cough, no fever, no headaches and no shortness of breath        Home Medications Prior to Admission medications   Medication Sig Start Date End Date Taking? Authorizing Provider  acetaminophen (TYLENOL) 500 MG tablet Take 500 mg by mouth every 6 (six) hours as needed for mild pain or headache (or cramps).    [provider]  albuterol (VENTOLIN HFA) 108 (90 Base) MCG/ACT inhaler TAKE 2 PUFFS BY MOUTH EVERY 6 HOURS AS NEEDED FOR WHEEZE OR SHORTNESS OF BREATH Patient taking differently: Inhale 2 puffs into the lungs every 6 (six) hours as needed for wheezing or shortness of breath. 01/22/23   [name], NP  budesonide-formoterol (SYMBICORT) 80-4.5 MCG/ACT inhaler INHALE 2 PUFFS INTO THE LUNGS TWICE A DAY Patient taking differently: Inhale 2 puffs into the lungs 2 (two) times daily. 01/27/23   [name], NP  Cholecalciferol (VITAMIN D3 PO) Take 1 capsule by mouth at bedtime.    [provider]   Cyanocobalamin (VITAMIN B-12 PO) Take 1 tablet by mouth at bedtime.    [provider]  Deferasirox (JADENU) 360 MG TABS Take 3 tablets (1,080 mg total) by mouth daily. 06/02/23   [name], NP  Deferiprone (FERRIPROX) 500 MG TABS Take 500 mg by mouth 2 (two) times daily. 05/14/23   Massie Maroon, FNP  diphenhydrAMINE (BENADRYL) 25 MG tablet Take 25 mg by mouth See admin instructions. Take 25 mg by mouth every four to six hours with each dose of Hydromorphone    [provider]  fluticasone (FLONASE) 50 MCG/ACT nasal spray Place 1 spray into both nostrils daily as needed for allergies or rhinitis. 12/31/21 05/05/23  [name], NP  folic acid (FOLVITE) 1 MG tablet Take 1 tablet (1 mg total) by mouth daily. 12/23/22   [name], NP  HYDROmorphone (DILAUDID) 4 MG tablet Take 1 tablet (4 mg total) by mouth every 6 (six) hours as needed for up to 15 days for severe pain (pain score 7-10). 07/03/23 07/18/23  [name], NP  mirtazapine (REMERON) 45 MG tablet Take 1 tablet (45 mg total) by mouth at bedtime. 12/23/22 12/23/23  [name], NP  naloxone [facility]) nasal spray 4 mg/0.1 mL Place 0.1 sprays (0.4 mg total) into the nose once as needed (opioid overdose). 12/23/22   [name], NP  polyethylene glycol (MIRALAX / GLYCOLAX) 17 g packet Take 17 g by mouth daily as  needed for mild constipation (MIX AS DIRECTED AND DRINK). 12/23/22   [name], NP  promethazine (PHENERGAN) 25 MG tablet Take 0.5-1 tablets (12.5-25 mg total) by mouth every 6 (six) hours as needed for nausea or vomiting. 10/31/21 06/02/23  [name], NP  Vitamin D, Ergocalciferol, (DRISDOL) 1.25 MG (50000 UNIT) CAPS capsule Take 1 capsule (50,000 Units total) by mouth once a week. Patient taking differently: Take 50,000 Units by mouth every Wednesday. 12/23/22 12/23/23  [name], NP  XARELTO 20 MG TABS tablet Take 1 tablet (20 mg total) by mouth daily with supper. Patient taking  differently: Take 20 mg by mouth at bedtime. 10/29/21 06/02/23  [name], NP      Allergies    Cefaclor, Hydroxyurea, Omeprazole, and Ketamine    Review of Systems   Review of Systems  Constitutional:  Negative for chills and fever.  Eyes:  Negative for visual disturbance.  Respiratory:  Negative for cough and shortness of breath.   Cardiovascular:  Positive for chest pain. Negative for leg swelling.  Gastrointestinal:  Positive for nausea and vomiting. Negative for abdominal pain, constipation and diarrhea.  Genitourinary:  Negative for dysuria.  Neurological:  Negative for weakness, numbness and headaches.    Physical Exam Updated Vital Signs BP 108/76 (BP Location: Left Arm)   Pulse (!) 102   Temp 98.3 F (36.8 C) (Oral)   Resp 14   Wt 53.1 kg   LMP 06/29/2023 (Approximate)   SpO2 100%   BMI 20.74 kg/m  Physical Exam Constitutional:      General: She is not in acute distress. HENT:     Head: Normocephalic and atraumatic.     Mouth/Throat:     Mouth: Mucous membranes are moist.     Pharynx: Oropharynx is clear.  Eyes:     General: Scleral icterus present.     Extraocular Movements: Extraocular movements intact.     Pupils: Pupils are equal, round, and reactive to light.  Cardiovascular:     Rate and Rhythm: Normal rate and regular rhythm.     Heart sounds: Normal heart sounds. No murmur heard. Pulmonary:     Effort: Pulmonary effort is normal. No respiratory distress.     Breath sounds: Normal breath sounds.  Chest:     Chest wall: Tenderness present.     Comments: Tenderness across chest wall to light palpation Abdominal:     General: Abdomen is flat. There is no distension.     Palpations: Abdomen is soft.     Tenderness: There is no abdominal tenderness. There is no guarding or rebound.  Musculoskeletal:        General: Tenderness present.     Cervical back: Neck supple.     Comments: B/l lower extremities tender to very light palpation throughout   Neurological:     General: No focal deficit present.     Mental Status: She is alert. Mental status is at baseline.     Sensory: No sensory deficit.     Motor: No weakness.     ED Results / Procedures / Treatments   Labs (all labs ordered are listed, but only abnormal results are displayed) Labs Reviewed  COMPREHENSIVE METABOLIC PANEL  CBC WITH DIFFERENTIAL/PLATELET  RETICULOCYTES  HCG, SERUM, QUALITATIVE    EKG EKG Interpretation Date/Time:  Tuesday July 08 2023 08:26:59 EST Ventricular Rate:  103 PR Interval:  139 QRS Duration:  78 QT Interval:  419 QTC Calculation: 567 R Axis:  38  Text Interpretation: Sinus tachycardia Biatrial enlargement Borderline repolarization abnormality Prolonged QT interval Confirmed by Beckey Downing [phone]) on 07/08/2023 8:31:01 AM  Radiology No results found.  Procedures Procedures    Medications Ordered in ED Medications  0.45 % sodium chloride infusion (has no administration in time range)  HYDROmorphone (DILAUDID) injection 2 mg (has no administration in time range)  HYDROmorphone (DILAUDID) injection 2 mg (has no administration in time range)  HYDROmorphone (DILAUDID) injection 2 mg (has no administration in time range)  diphenhydrAMINE (BENADRYL) 12.5 mg in sodium chloride 0.9 % 50 mL IVPB (has no administration in time range)  ondansetron (ZOFRAN) injection 4 mg (has no administration in time range)    ED Course/ Medical Decision Making/ A&P                                 Medical Decision Making 43 year old female with 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  Notably has had multiple recent ED visits with similar presentation.  Hemodynamically stable and afebrile on room air, EKG without signs of acute ischemia.  Low suspicion for acute chest syndrome, acute coronary  syndrome, or acute PE.  Patient also underwent extensive workup recently with negative chest x-ray and CTA chest.  Will check CBC, CMP, reticulocyte count.  Start with 1/2 NS infusion, IV benadryl, Zofran, and IV dilaudid then reevaluate.   10:48 AM Seen after 2 doses of Dilaudid, Benadryl and fluids running. Pt feeling somewhat better. Will reevaluate after 3rd dose  12:15 PM Reevaluated and patient feeling better. Her tenderness has improved and she feels comfortable with discharge. Discussed return precautions and encouraged outpatient follow up  Amount and/or Complexity of Data Reviewed Labs: ordered.  Risk Prescription drug management.          Final Clinical Impression(s) / ED Diagnoses Final diagnoses:  None    Rx / DC Orders ED Discharge Orders     None         Vonna Drafts, MD 07/08/23 1217    Lorre Nick, MD 07/09/23 1214

## 2023-07-08 NOTE — Discharge Instructions (Addendum)
You received IV medications and fluids for your pain crisis. Please follow up closely with your outpatient care team. Continue your current medications.  Please seek medical attention if you have worsening symptoms including chest pain or trouble breathing.

## 2023-07-08 NOTE — ED Provider Notes (Signed)
I saw and evaluated the patient, reviewed the resident's note and I agree with the findings and plan.  EKG Interpretation Date/Time:  Tuesday July 08 2023 08:26:59 EST Ventricular Rate:  103 PR Interval:  139 QRS Duration:  78 QT Interval:  419 QTC Calculation: 567 R Axis:   38  Text Interpretation: Sinus tachycardia Biatrial enlargement Borderline repolarization abnormality Prolonged QT interval Confirmed by Beckey Downing [phone]) on 07/08/2023 8:31:01 AM   Patient's EKG shows sinus tachycardia.  Patient here with reducible chest pain and whole body aches consistent with her prior pain crisis.  Seen at outside facility and had a complete evaluation for this yesterday including CT angio chest.  Suspect patient had a vaso-occlusive crisis versus chronic pain.  Will medicate and reassess   Lorre Nick, MD 07/08/23 [phone]

## 2023-07-15 DIAGNOSIS — M79602 Pain in left arm: Secondary | ICD-10-CM | POA: Diagnosis not present

## 2023-07-15 DIAGNOSIS — Z86018 Personal history of other benign neoplasm: Secondary | ICD-10-CM | POA: Diagnosis not present

## 2023-07-15 DIAGNOSIS — D649 Anemia, unspecified: Secondary | ICD-10-CM | POA: Diagnosis not present

## 2023-07-15 DIAGNOSIS — Z7951 Long term (current) use of inhaled steroids: Secondary | ICD-10-CM | POA: Diagnosis not present

## 2023-07-15 DIAGNOSIS — M79604 Pain in right leg: Secondary | ICD-10-CM | POA: Diagnosis not present

## 2023-07-15 DIAGNOSIS — Z79899 Other long term (current) drug therapy: Secondary | ICD-10-CM | POA: Diagnosis not present

## 2023-07-15 DIAGNOSIS — J4530 Mild persistent asthma, uncomplicated: Secondary | ICD-10-CM | POA: Diagnosis not present

## 2023-07-15 DIAGNOSIS — D57219 Sickle-cell/Hb-C disease with crisis, unspecified: Secondary | ICD-10-CM | POA: Diagnosis not present

## 2023-07-15 DIAGNOSIS — T402X5A Adverse effect of other opioids, initial encounter: Secondary | ICD-10-CM | POA: Diagnosis not present

## 2023-07-15 DIAGNOSIS — K5903 Drug induced constipation: Secondary | ICD-10-CM | POA: Diagnosis not present

## 2023-07-15 DIAGNOSIS — Z7901 Long term (current) use of anticoagulants: Secondary | ICD-10-CM | POA: Diagnosis not present

## 2023-07-15 DIAGNOSIS — Z3202 Encounter for pregnancy test, result negative: Secondary | ICD-10-CM | POA: Diagnosis not present

## 2023-07-15 DIAGNOSIS — M549 Dorsalgia, unspecified: Secondary | ICD-10-CM | POA: Diagnosis not present

## 2023-07-15 DIAGNOSIS — Z86711 Personal history of pulmonary embolism: Secondary | ICD-10-CM | POA: Diagnosis not present

## 2023-07-15 DIAGNOSIS — J45909 Unspecified asthma, uncomplicated: Secondary | ICD-10-CM | POA: Diagnosis not present

## 2023-07-15 DIAGNOSIS — M79605 Pain in left leg: Secondary | ICD-10-CM | POA: Diagnosis not present

## 2023-07-15 DIAGNOSIS — R079 Chest pain, unspecified: Secondary | ICD-10-CM | POA: Diagnosis not present

## 2023-07-15 DIAGNOSIS — D5700 Hb-SS disease with crisis, unspecified: Secondary | ICD-10-CM | POA: Diagnosis not present

## 2023-07-16 DIAGNOSIS — Z86018 Personal history of other benign neoplasm: Secondary | ICD-10-CM | POA: Diagnosis not present

## 2023-07-16 DIAGNOSIS — Z3202 Encounter for pregnancy test, result negative: Secondary | ICD-10-CM | POA: Diagnosis not present

## 2023-07-16 DIAGNOSIS — M79604 Pain in right leg: Secondary | ICD-10-CM | POA: Diagnosis not present

## 2023-07-16 DIAGNOSIS — Z79899 Other long term (current) drug therapy: Secondary | ICD-10-CM | POA: Diagnosis not present

## 2023-07-16 DIAGNOSIS — Z7901 Long term (current) use of anticoagulants: Secondary | ICD-10-CM | POA: Diagnosis not present

## 2023-07-16 DIAGNOSIS — K5903 Drug induced constipation: Secondary | ICD-10-CM | POA: Diagnosis not present

## 2023-07-16 DIAGNOSIS — Z7951 Long term (current) use of inhaled steroids: Secondary | ICD-10-CM | POA: Diagnosis not present

## 2023-07-16 DIAGNOSIS — M79605 Pain in left leg: Secondary | ICD-10-CM | POA: Diagnosis not present

## 2023-07-16 DIAGNOSIS — M79602 Pain in left arm: Secondary | ICD-10-CM | POA: Diagnosis not present

## 2023-07-16 DIAGNOSIS — D5700 Hb-SS disease with crisis, unspecified: Secondary | ICD-10-CM | POA: Diagnosis not present

## 2023-07-16 DIAGNOSIS — T402X5A Adverse effect of other opioids, initial encounter: Secondary | ICD-10-CM | POA: Diagnosis not present

## 2023-07-16 DIAGNOSIS — D57219 Sickle-cell/Hb-C disease with crisis, unspecified: Secondary | ICD-10-CM | POA: Diagnosis not present

## 2023-07-16 DIAGNOSIS — J45909 Unspecified asthma, uncomplicated: Secondary | ICD-10-CM | POA: Diagnosis not present

## 2023-07-16 DIAGNOSIS — Z86711 Personal history of pulmonary embolism: Secondary | ICD-10-CM | POA: Diagnosis not present

## 2023-07-16 DIAGNOSIS — M549 Dorsalgia, unspecified: Secondary | ICD-10-CM | POA: Diagnosis not present

## 2023-07-16 DIAGNOSIS — J4530 Mild persistent asthma, uncomplicated: Secondary | ICD-10-CM | POA: Diagnosis not present

## 2023-07-21 ENCOUNTER — Other Ambulatory Visit: Payer: Self-pay | Admitting: Nurse Practitioner

## 2023-07-21 DIAGNOSIS — D5700 Hb-SS disease with crisis, unspecified: Secondary | ICD-10-CM

## 2023-07-21 MED ORDER — HYDROMORPHONE HCL 4 MG PO TABS
4.0000 mg | ORAL_TABLET | Freq: Four times a day (QID) | ORAL | 0 refills | Status: DC | PRN
  Filled 2023-07-21 – 2023-07-23 (×2): qty 60, 15d supply, fill #0

## 2023-07-21 NOTE — Telephone Encounter (Signed)
 Please advise [facility]

## 2023-07-22 ENCOUNTER — Other Ambulatory Visit (HOSPITAL_COMMUNITY): Payer: Self-pay

## 2023-07-22 ENCOUNTER — Other Ambulatory Visit: Payer: Self-pay

## 2023-07-23 ENCOUNTER — Other Ambulatory Visit (HOSPITAL_COMMUNITY): Payer: Self-pay

## 2023-07-23 ENCOUNTER — Other Ambulatory Visit: Payer: Self-pay

## 2023-07-24 ENCOUNTER — Other Ambulatory Visit (HOSPITAL_COMMUNITY): Payer: Self-pay

## 2023-07-29 ENCOUNTER — Other Ambulatory Visit: Payer: Self-pay

## 2023-07-31 ENCOUNTER — Telehealth: Payer: Self-pay

## 2023-07-31 NOTE — Transitions of Care (Post Inpatient/ED Visit) (Signed)
   07/31/2023  Name: Molly Gutierrez MRN: [id number] DOB: 12-28-1980  Today's TOC FU Call Status: Today's TOC FU Call Status:: Unsuccessful Call (1st Attempt) Unsuccessful Call (1st Attempt) Date: 07/31/23  Attempted to reach the patient regarding the most recent Inpatient/ED visit.  Follow Up Plan: Additional outreach attempts will be made to reach the patient to complete the Transitions of Care (Post Inpatient/ED visit) call.   Wyline Mood BSN, Programmer, systems   Transitions of Care  [place] / [facility], [facility] Direct Dial Number: [phone]  Fax: [phone]

## 2023-08-01 ENCOUNTER — Telehealth: Payer: Self-pay

## 2023-08-01 NOTE — Transitions of Care (Post Inpatient/ED Visit) (Signed)
   08/01/2023  Name: Molly Gutierrez MRN: [id number] DOB: 1981-01-16  Today's TOC FU Call Status: Today's TOC FU Call Status:: Unsuccessful Call (2nd Attempt) Unsuccessful Call (2nd Attempt) Date: 08/01/23  Attempted to reach the patient regarding the most recent Inpatient/ED visit.  Follow Up Plan: Additional outreach attempts will be made to reach the patient to complete the Transitions of Care (Post Inpatient/ED visit) call.   Wyline Mood BSN, Programmer, systems   Transitions of Care  [place] / [facility], [facility] Direct Dial Number: [phone]  Fax: [phone]

## 2023-08-05 ENCOUNTER — Other Ambulatory Visit: Payer: Self-pay

## 2023-08-05 ENCOUNTER — Ambulatory Visit: Payer: Self-pay | Admitting: Nurse Practitioner

## 2023-08-05 ENCOUNTER — Telehealth: Payer: Self-pay

## 2023-08-05 ENCOUNTER — Other Ambulatory Visit: Payer: Self-pay | Admitting: Nurse Practitioner

## 2023-08-05 DIAGNOSIS — R9431 Abnormal electrocardiogram [ECG] [EKG]: Secondary | ICD-10-CM | POA: Diagnosis not present

## 2023-08-05 DIAGNOSIS — D5700 Hb-SS disease with crisis, unspecified: Secondary | ICD-10-CM

## 2023-08-05 NOTE — Telephone Encounter (Signed)
Pt disconnected during transfer, call back placed twice as she had cel phone connection issues.  Pt requesting sickle cell day clinic information.   Copied from CRM [id number]. Topic: Clinical - Red Word Triage >> Aug 05, 2023  8:08 AM Molly Gutierrez wrote: Red Word that prompted transfer to Nurse Triage: severe arm and leg pain Reason for Disposition  Health Information question, no triage required and triager able to answer question  Answer Assessment - Initial Assessment Questions 1. REASON FOR CALL or QUESTION: "What is your reason for calling today?" or "How can I best help you?" or "What question do you have that I can help answer?"     Looking to be connected to the sickle cell clinic  Protocols used: Information Only Call - No Triage-A-AH

## 2023-08-05 NOTE — Transitions of Care (Post Inpatient/ED Visit) (Signed)
08/05/2023  Name: Molly Gutierrez MRN: [id number] DOB: 02-Apr-1981  Today's TOC FU Call Status: Today's TOC FU Call Status:: Successful TOC FU Call Completed TOC FU Call Complete Date: 08/05/23 Patient's Name and Date of Birth confirmed.  Transition Care Management Follow-up Telephone Call Date of Discharge: 07/29/23 Name of Other ([facility]) Discharge Facility: [facility] Type of Discharge: Inpatient Admission Primary Inpatient Discharge Diagnosis:: Sickle Cell Anemia Crisis How have you been since you were released from the hospital?: Same ("About the same" Asked for pain rating, "I am about a 7-8/10, I left msg with PCP office regarding refilling medication, only a few tabs left to possibly stretch until the morning") Any questions or concerns?: Yes Patient Questions/Concerns:: Awaiting call back/message from PCP Angus Seller NP for medication refill Patient Questions/Concerns Addressed:  [facility] Notified Provider, Warner Mccreedy of Patient Questions/Concerns related to her pain, recent visit to emergency room this morning and pending request for pain medication refill as appropriate.  Items Reviewed: Did you receive and understand the discharge instructions provided?: Yes Medications obtained,verified, and reconciled?: Yes (Medications Reviewed) Any new allergies since your discharge?: No Dietary orders reviewed?: NA Do you have support at home?: Yes People in Home: parent(s) Name of Support/Comfort Primary Source: Father  Medications Reviewed Today: Medications Reviewed Today     Reviewed by Wyline Mood, RN (Case Manager) on 08/05/23 at 1722  Med List Status: <None>   Medication Order Taking? Sig Documenting Provider Last Dose Status Informant  acetaminophen (TYLENOL) 500 MG tablet [id number] Yes Take 500 mg by mouth every 6 (six) hours as needed for mild pain or headache (or cramps). [provider] Taking Active Self, Pharmacy Records  albuterol (VENTOLIN HFA)  108 (90 Base) MCG/ACT inhaler [id number] Yes TAKE 2 PUFFS BY MOUTH EVERY 6 HOURS AS NEEDED FOR WHEEZE OR SHORTNESS OF BREATH  Patient taking differently: Inhale 2 puffs into the lungs every 6 (six) hours as needed for wheezing or shortness of breath.   [name], NP Taking Active Self, Pharmacy Records  budesonide-formoterol [facility]) 80-4.5 MCG/ACT inhaler [id number] Yes INHALE 2 PUFFS INTO THE LUNGS TWICE A DAY  Patient taking differently: Inhale 2 puffs into the lungs 2 (two) times daily.   [name], NP Taking Active Self, Pharmacy Records  Cholecalciferol (VITAMIN D3 PO) [id number] Yes Take 1 capsule by mouth at bedtime. [provider] Taking Active Self, Pharmacy Records           Med Note Daphine Deutscher, Madalyn Rob   Tue Aug 05, 2023  5:03 PM) 1000 units (25 mcg) tablet, Take 1,000 units by mouth once daily.  Cyanocobalamin (VITAMIN B-12 PO) [id number] Yes Take 1 tablet by mouth at bedtime. [provider] Taking Active Self, Pharmacy Records           Med Note Wyline Mood   Tue Aug 05, 2023  5:04 PM) Take 1,000 mcg by mouth Once Daily.  Deferasirox (JADENU) 360 MG TABS [id number]  Take 3 tablets (1,080 mg total) by mouth daily. [name], NP  Active   Deferiprone (FERRIPROX) 500 MG TABS [id number] Yes Take 500 mg by mouth 2 (two) times daily. Massie Maroon, FNP Taking Active            Med Note Wyline Mood   Tue Aug 05, 2023  5:05 PM) deferiprone 500 mg Tab, Take 1,000 mg by mouth daily.  diphenhydrAMINE (BENADRYL) 25 MG tablet [id number] Yes Take 25 mg by mouth See admin instructions. Take 25 mg by mouth every  four to six hours with each dose of Hydromorphone [provider] Taking Active Self, Pharmacy Records           Med Note [name], REGEENA   Thu Oct 05, 2020  5:11 PM)    fluticasone (FLONASE) 50 MCG/ACT nasal spray [id number]  Place 1 spray into both nostrils daily as needed for allergies or rhinitis. [name], NP  Expired  05/05/23 2359 Self, Pharmacy Records  folic acid (FOLVITE) 1 MG tablet [id number] Yes Take 1 tablet (1 mg total) by mouth daily. [name], NP Taking Active Self, Pharmacy Records  HYDROmorphone (DILAUDID) 4 MG tablet [id number] Yes Take 1 tablet (4 mg total) by mouth every 6 (six) hours as needed for up to 15 days for severe pain (pain score 7-10). [name], NP Taking Active            Med Note Wyline Mood   Tue Aug 05, 2023  5:17 PM) Per d/c summary and patient, Take 4 mg by mouth as needed for mild pain (1-3).  mirtazapine (REMERON) 45 MG tablet [id number] Yes Take 1 tablet (45 mg total) by mouth at bedtime. [name], NP Taking Active Self, Pharmacy Records  naloxone [facility]) nasal spray 4 mg/0.1 mL [id number] Yes Place 0.1 sprays (0.4 mg total) into the nose once as needed (opioid overdose). [name], NP Taking Active Self, Pharmacy Records           Med Note Wyline Mood   Tue Aug 05, 2023  5:18 PM) As needed  polyethylene glycol (MIRALAX / GLYCOLAX) 17 g packet [id number] Yes Take 17 g by mouth daily as needed for mild constipation (MIX AS DIRECTED AND DRINK). [name], NP Taking Active Self, Pharmacy Records           Med Note Wyline Mood   Tue Aug 05, 2023  5:18 PM) As needed  promethazine (PHENERGAN) 25 MG tablet [id number]  Take 0.5-1 tablets (12.5-25 mg total) by mouth every 6 (six) hours as needed for nausea or vomiting. [name], NP  Expired 06/02/23 2359 Self, Pharmacy Records           Med Note Wyline Mood   Tue Aug 05, 2023  5:22 PM) As needed for nausea,vomiting  Vitamin D, Ergocalciferol, (DRISDOL) 1.25 MG (50000 UNIT) CAPS capsule [id number] Yes Take 1 capsule (50,000 Units total) by mouth once a week.  Patient taking differently: Take 50,000 Units by mouth every Wednesday.   [name], NP Taking Active Self, Pharmacy Records  XARELTO 20 MG TABS tablet [id number]  Take 1 tablet (20 mg total) by mouth daily with  supper.  Patient taking differently: Take 20 mg by mouth at bedtime.   [name], NP  Expired 06/02/23 2359 Self, Pharmacy Records           Med Note Wyline Mood   Tue Aug 05, 2023  5:21 PM) Patient reports she continues to take as prescribed  Med List Note Salvatore Marvel, CPhT 04/02/22 1640): The "k" is in the patient's first name is SILENT              Home Care and Equipment/Supplies: Were Home Health Services Ordered?: NA Any new equipment or medical supplies ordered?: NA  Functional Questionnaire: Do you need assistance with bathing/showering or dressing?: No Do you need assistance with meal preparation?: No Do you need assistance with eating?: No Do you have difficulty maintaining continence: No Do you  need assistance with getting out of bed/getting out of a chair/moving?: No Do you have difficulty managing or taking your medications?: No  Follow up appointments reviewed: PCP Follow-up appointment confirmed?: Yes Date of PCP follow-up appointment?: 08/22/23 Specialist Hospital Follow-up appointment confirmed?: NA Do you need transportation to your follow-up appointment?: No Do you understand care options if your condition(s) worsen?: Yes-patient verbalized understanding  SDOH Interventions Today    Flowsheet Row Most Recent Value  SDOH Interventions   Food Insecurity Interventions Intervention Not Indicated  Housing Interventions Intervention Not Indicated  Transportation Interventions Intervention Not Indicated  Utilities Interventions Intervention Not Indicated      Interventions Today    Flowsheet Row Most Recent Value  Chronic Disease   Chronic disease during today's visit Other  [Sickle Cell Anemia with crisis pain.  Reports ED visit today 08/05/23 for nausea,vomiting and sickle cell-like pain.  Only somewhat better since discharge from ED.  Denies N/V at present,  rates pain 7-8/10. PCP made  aware.]  General Interventions   General  Interventions Discussed/Reviewed General Interventions Discussed, General Interventions Reviewed, Doctor Visits  Doctor Visits Discussed/Reviewed Doctor Visits Discussed, Doctor Visits Reviewed, PCP, Specialist  [Patient stated she will schedule her follow-up visit with PCP soon.  Declined RNCM assistance with scheduling appointment with care guide.]  Exercise Interventions   Exercise Discussed/Reviewed Physical Activity  Physical Activity Discussed/Reviewed Physical Activity Reviewed, Physical Activity Discussed  [Increase activity slowlya nd balance with rest]  Education Interventions   Education Provided Provided Education  Provided Verbal Education On When to see the doctor, Medication, Nutrition, Other  [Importance of scheduling follow-up appointments with PCP and referral to specialist within 7-10 days of hospital discharge. Patient will notify their PCP promptly for pain intensity level that is greater than their comfort-function goal.]  Nutrition Interventions   Nutrition Discussed/Reviewed Fluid intake, Nutrition Reviewed, Nutrition Discussed  [importance for good hydration, monitoring of hydration status,]  Pharmacy Interventions   Pharmacy Dicussed/Reviewed Medications and their functions, Medication Adherence  Safety Interventions   Safety Discussed/Reviewed Fall Risk  [Safety with pain medication: Do not drive a car,  or participate in other possibly dangerous activities until you know how the medication affects you.  It is possible your pain medicine may cause dizziness/lightheadedness, so use caution when getting up.]       TOC Interventions Today    Flowsheet Row Most Recent Value  TOC Interventions   TOC Interventions Discussed/Reviewed TOC Interventions Discussed, TOC Interventions Reviewed, Contacted provider for patient needs, S/S of infection, Post discharge activity limitations per provider  [facility] secure message PCP, Angus Seller, regarding patient's visit to emergency  room today for nausea,vomiting and increased pain in legs and right arm.  Discharged from ED after anti-emetics and pain medication.  Patient reports some relief with pain but not yet at her baseline level.  Patient states needs refill on her pain medication.  Contacted primary care provider for patient to advise of today's emergency room visit and her request for pain medication refill.  Denies any nausea or vomiting at this time.   Patient has managed her sickle cell for years and declines weekly RN contact via TOC program at this time.  Patient has RNCM direct contact information should she have any health related questions or concerns after today's call.  Patient agreeable to 1 x follow up on 08/06/23 to ensure her pain level is back to her normal baseline and verify her medications have been filled.    Follow up by [facility]  One time: 08/06/2023.  Wyline Mood BSN, Programmer, systems   Transitions of Care  [place] / [facility], [facility] Direct Dial Number: [phone]  Fax: [phone]

## 2023-08-06 ENCOUNTER — Other Ambulatory Visit: Payer: Self-pay | Admitting: Nurse Practitioner

## 2023-08-06 ENCOUNTER — Other Ambulatory Visit: Payer: Self-pay

## 2023-08-06 ENCOUNTER — Ambulatory Visit: Payer: Self-pay | Admitting: Nurse Practitioner

## 2023-08-06 ENCOUNTER — Other Ambulatory Visit (HOSPITAL_COMMUNITY): Payer: Self-pay

## 2023-08-06 ENCOUNTER — Telehealth: Payer: Self-pay | Admitting: Nurse Practitioner

## 2023-08-06 ENCOUNTER — Emergency Department (HOSPITAL_COMMUNITY)
Admission: EM | Admit: 2023-08-06 | Discharge: 2023-08-06 | Disposition: A | Discharge status: Home / Self Care | Payer: Medicare HMO | Attending: Emergency Medicine | Admitting: Emergency Medicine

## 2023-08-06 DIAGNOSIS — R112 Nausea with vomiting, unspecified: Secondary | ICD-10-CM | POA: Diagnosis not present

## 2023-08-06 DIAGNOSIS — D5700 Hb-SS disease with crisis, unspecified: Secondary | ICD-10-CM | POA: Insufficient documentation

## 2023-08-06 DIAGNOSIS — Z7901 Long term (current) use of anticoagulants: Secondary | ICD-10-CM | POA: Insufficient documentation

## 2023-08-06 LAB — CBC WITH DIFFERENTIAL/PLATELET
Abs Immature Granulocytes: 0.09 10*3/uL — ABNORMAL HIGH (ref 0.00–0.07)
Basophils Absolute: 0.1 10*3/uL (ref 0.0–0.1)
Basophils Relative: 0 %
Eosinophils Absolute: 0.3 10*3/uL (ref 0.0–0.5)
Eosinophils Relative: 2 %
HCT: 24.3 % — ABNORMAL LOW (ref 36.0–46.0)
Hemoglobin: 8.2 g/dL — ABNORMAL LOW (ref 12.0–15.0)
Immature Granulocytes: 1 %
Lymphocytes Relative: 17 %
Lymphs Abs: 2.7 10*3/uL (ref 0.7–4.0)
MCH: 30.7 pg (ref 26.0–34.0)
MCHC: 33.7 g/dL (ref 30.0–36.0)
MCV: 91 fL (ref 80.0–100.0)
Monocytes Absolute: 2.1 10*3/uL — ABNORMAL HIGH (ref 0.1–1.0)
Monocytes Relative: 14 %
Neutro Abs: 10.2 10*3/uL — ABNORMAL HIGH (ref 1.7–7.7)
Neutrophils Relative %: 66 %
Platelets: 458 10*3/uL — ABNORMAL HIGH (ref 150–400)
RBC: 2.67 MIL/uL — ABNORMAL LOW (ref 3.87–5.11)
RDW: 25.9 % — ABNORMAL HIGH (ref 11.5–15.5)
WBC: 15.3 10*3/uL — ABNORMAL HIGH (ref 4.0–10.5)
nRBC: 1.2 % — ABNORMAL HIGH (ref 0.0–0.2)

## 2023-08-06 LAB — COMPREHENSIVE METABOLIC PANEL
ALT: 15 U/L (ref 0–44)
AST: 43 U/L — ABNORMAL HIGH (ref 15–41)
Albumin: 4.2 g/dL (ref 3.5–5.0)
Alkaline Phosphatase: 52 U/L (ref 38–126)
Anion gap: 7 (ref 5–15)
BUN: 10 mg/dL (ref 6–20)
CO2: 22 mmol/L (ref 22–32)
Calcium: 9.1 mg/dL (ref 8.9–10.3)
Chloride: 107 mmol/L (ref 98–111)
Creatinine, Ser: 0.46 mg/dL (ref 0.44–1.00)
GFR, Estimated: >60 mL/min (ref >60)
Glucose, Bld: 108 mg/dL — ABNORMAL HIGH (ref 70–99)
Potassium: 4 mmol/L (ref 3.5–5.1)
Sodium: 136 mmol/L (ref 135–145)
Total Bilirubin: 4.1 mg/dL — ABNORMAL HIGH (ref 0.0–1.2)
Total Protein: 8.2 g/dL — ABNORMAL HIGH (ref 6.5–8.1)

## 2023-08-06 LAB — RETICULOCYTES
Immature Retic Fract: 22.4 % — ABNORMAL HIGH (ref 2.3–15.9)
RBC.: 2.71 MIL/uL — ABNORMAL LOW (ref 3.87–5.11)
Retic Count, Absolute: 537.6 10*3/uL — ABNORMAL HIGH (ref 19.0–186.0)
Retic Ct Pct: 20.4 % — ABNORMAL HIGH (ref 0.4–3.1)

## 2023-08-06 LAB — HCG, SERUM, QUALITATIVE: Preg, Serum: NEGATIVE

## 2023-08-06 MED ORDER — HYDROMORPHONE HCL 2 MG/ML IJ SOLN
2.0000 mg | INTRAMUSCULAR | Status: AC
  Administered 2023-08-06: 2 mg via INTRAVENOUS
  Filled 2023-08-06: qty 1

## 2023-08-06 MED ORDER — HYDROMORPHONE HCL 4 MG PO TABS
4.0000 mg | ORAL_TABLET | Freq: Four times a day (QID) | ORAL | 0 refills | Status: DC | PRN
Start: 2023-08-07 — End: 2023-08-06
  Filled ????-??-??: fill #0

## 2023-08-06 MED ORDER — SODIUM CHLORIDE 0.45 % IV SOLN: INTRAVENOUS | Status: DC

## 2023-08-06 MED ORDER — SODIUM CHLORIDE 0.9 % IV SOLN
12.5000 mg | Freq: Once | INTRAVENOUS | Status: AC
  Administered 2023-08-06: 12.5 mg via INTRAVENOUS
  Filled 2023-08-06: qty 12.5

## 2023-08-06 MED ORDER — PROMETHAZINE HCL 25 MG PO TABS
12.5000 mg | ORAL_TABLET | Freq: Four times a day (QID) | ORAL | 11 refills | Status: AC | PRN
  Filled 2023-08-06: qty 30, 8d supply, fill #0
  Filled 2023-09-15: qty 30, 8d supply, fill #1
  Filled 2023-10-17: qty 30, 8d supply, fill #2
  Filled 2023-11-22: qty 30, 8d supply, fill #3
  Filled 2024-02-29: qty 30, 8d supply, fill #4
  Filled 2024-05-11 – 2024-07-15 (×2): qty 30, 8d supply, fill #5

## 2023-08-06 MED ORDER — KETOROLAC TROMETHAMINE 15 MG/ML IJ SOLN
15.0000 mg | INTRAMUSCULAR | Status: AC
  Administered 2023-08-06: 15 mg via INTRAVENOUS
  Filled 2023-08-06: qty 1

## 2023-08-06 MED ORDER — HEPARIN SOD (PORK) LOCK FLUSH 100 UNIT/ML IV SOLN
500.0000 [IU] | Freq: Once | INTRAVENOUS | Status: AC
  Administered 2023-08-06: 500 [IU]
  Filled 2023-08-06: qty 5

## 2023-08-06 MED ORDER — ONDANSETRON HCL 4 MG/2ML IJ SOLN: 4.0000 mg | INTRAMUSCULAR | Status: DC | PRN

## 2023-08-06 MED ORDER — HYDROMORPHONE HCL 4 MG PO TABS
4.0000 mg | ORAL_TABLET | Freq: Four times a day (QID) | ORAL | 0 refills | Status: DC | PRN
  Filled ????-??-??: fill #0

## 2023-08-06 MED ORDER — HYDROMORPHONE HCL 4 MG PO TABS
4.0000 mg | ORAL_TABLET | Freq: Four times a day (QID) | ORAL | 0 refills | Status: DC | PRN
Start: 2023-08-06 — End: 2023-08-17
  Filled 2023-08-06: qty 30, 7d supply, fill #0
  Filled 2023-08-06: qty 30, 8d supply, fill #0

## 2023-08-06 NOTE — Telephone Encounter (Signed)
 Sent to provider. [facility]

## 2023-08-06 NOTE — Progress Notes (Signed)
Correction medication is in stock. Will mail 2.20 via [employer].

## 2023-08-06 NOTE — Transitions of Care (Post Inpatient/ED Visit) (Signed)
   08/06/2023  Name: Landry Lookingbill MRN: [id number] DOB: 1981-02-28  Today's TOC FU Call Status: Today's TOC FU Call Status:: Unsuccessful Call (1st Attempt) Unsuccessful Call (1st Attempt) Date: 08/06/23  Attempted to reach the patient regarding the most recent Inpatient/ED visit.  Follow Up Plan: Additional outreach attempts will be made to reach the patient to complete the Transitions of Care (Post Inpatient/ED visit) call.   Wyline Mood BSN, RN Medical illustrator, Transition of Care [place] / [facility], [facility] Direct Dial:  [phone] / Fax: [phone] Website: [email or web address]

## 2023-08-06 NOTE — ED Provider Notes (Signed)
[place] [facility]ERGENCY DE[facility]MENT AT [facility] Provider Note   CSN: [id number] Arrival date & time: 08/06/23  8413     History  Chief Complaint  Patient presents with   Sickle Cell Pain Crisis    Molly Gutierrez is a 43 y.o. female.  43 year old female with history of sickle cell disease presents with vaso-occlusive crisis.  Patient states that she has pain to her extremities.  States that this is her usual location.  Feels like the change of weather is causing this.  Was seen at [facility] yesterday for similar symptoms.  Was treated with 4 doses of IV Dilaudid and sent home.  States that her pain is uncontrolled with her home IV Dilaudid.  Denies any chest pain or shortness of breath.  No fever or chills.  No severe headaches.  No abdominal discomfort although she has had some nausea and vomiting which she states she usually gets when she has a pain crisis.       Home Medications Prior to Admission medications   Medication Sig Start Date End Date Taking? Authorizing Provider  acetaminophen (TYLENOL) 500 MG tablet Take 500 mg by mouth every 6 (six) hours as needed for mild pain or headache (or cramps).    [provider]  albuterol (VENTOLIN HFA) 108 (90 Base) MCG/ACT inhaler TAKE 2 PUFFS BY MOUTH EVERY 6 HOURS AS NEEDED FOR WHEEZE OR SHORTNESS OF BREATH Patient taking differently: Inhale 2 puffs into the lungs every 6 (six) hours as needed for wheezing or shortness of breath. 01/22/23   [name], NP  budesonide-formoterol (SYMBICORT) 80-4.5 MCG/ACT inhaler INHALE 2 PUFFS INTO THE LUNGS TWICE A DAY Patient taking differently: Inhale 2 puffs into the lungs 2 (two) times daily. 01/27/23   [name], NP  Cholecalciferol (VITAMIN D3 PO) Take 1 capsule by mouth at bedtime.    [provider]  Cyanocobalamin (VITAMIN B-12 PO) Take 1 tablet by mouth at bedtime.    [provider]  Deferasirox (JADENU) 360 MG TABS Take 3 tablets  (1,080 mg total) by mouth daily. 06/02/23   [name], NP  Deferiprone (FERRIPROX) 500 MG TABS Take 500 mg by mouth 2 (two) times daily. 05/14/23   Massie Maroon, FNP  diphenhydrAMINE (BENADRYL) 25 MG tablet Take 25 mg by mouth See admin instructions. Take 25 mg by mouth every four to six hours with each dose of Hydromorphone    [provider]  fluticasone (FLONASE) 50 MCG/ACT nasal spray Place 1 spray into both nostrils daily as needed for allergies or rhinitis. 12/31/21 05/05/23  [name], NP  folic acid (FOLVITE) 1 MG tablet Take 1 tablet (1 mg total) by mouth daily. 12/23/22   [name], NP  HYDROmorphone (DILAUDID) 4 MG tablet Take 1 tablet (4 mg total) by mouth every 6 (six) hours as needed for up to 15 days for severe pain (pain score 7-10). 07/21/23 08/08/23  [name], NP  mirtazapine (REMERON) 45 MG tablet Take 1 tablet (45 mg total) by mouth at bedtime. 12/23/22 12/23/23  [name], NP  naloxone [facility]) nasal spray 4 mg/0.1 mL Place 0.1 sprays (0.4 mg total) into the nose once as needed (opioid overdose). 12/23/22   [name], NP  polyethylene glycol (MIRALAX / GLYCOLAX) 17 g packet Take 17 g by mouth daily as needed for mild constipation (MIX AS DIRECTED AND DRINK). 12/23/22   [name], NP  promethazine (PHENERGAN) 25 MG  tablet Take 0.5-1 tablets (12.5-25 mg total) by mouth every 6 (six) hours as needed for nausea or vomiting. 10/31/21 06/02/23  [name], NP  Vitamin D, Ergocalciferol, (DRISDOL) 1.25 MG (50000 UNIT) CAPS capsule Take 1 capsule (50,000 Units total) by mouth once a week. Patient taking differently: Take 50,000 Units by mouth every Wednesday. 12/23/22 12/23/23  [name], NP  XARELTO 20 MG TABS tablet Take 1 tablet (20 mg total) by mouth daily with supper. Patient taking differently: Take 20 mg by mouth at bedtime. 10/29/21 06/02/23  [name], NP      Allergies    Cefaclor, Hydroxyurea, Omeprazole, and  Ketamine    Review of Systems   Review of Systems  All other systems reviewed and are negative.   Physical Exam Updated Vital Signs BP (!) 112/92 (BP Location: Left Arm)   Pulse 96   Temp 98.3 F (36.8 C) (Oral)   Resp 16   Ht 1.6 m (5\' 3" )   Wt 54.4 kg   LMP 06/29/2023 (Approximate)   SpO2 97%   BMI 21.26 kg/m  Physical Exam Vitals and nursing note reviewed.  Constitutional:      General: She is not in acute distress.    Appearance: Normal appearance. She is well-developed. She is not toxic-appearing.  HENT:     Head: Normocephalic and atraumatic.  Eyes:     General: Lids are normal.     Conjunctiva/sclera: Conjunctivae normal.     Pupils: Pupils are equal, round, and reactive to light.  Neck:     Thyroid: No thyroid mass.     Trachea: No tracheal deviation.  Cardiovascular:     Rate and Rhythm: Normal rate and regular rhythm.     Heart sounds: Normal heart sounds. No murmur heard.    No gallop.  Pulmonary:     Effort: Pulmonary effort is normal. No respiratory distress.     Breath sounds: Normal breath sounds. No stridor. No decreased breath sounds, wheezing, rhonchi or rales.  Abdominal:     General: There is no distension.     Palpations: Abdomen is soft.     Tenderness: There is no abdominal tenderness. There is no rebound.  Musculoskeletal:        General: No tenderness. Normal range of motion.     Cervical back: Normal range of motion and neck supple.  Skin:    General: Skin is warm and dry.     Findings: No abrasion or rash.  Neurological:     Mental Status: She is alert and oriented to person, place, and time. Mental status is at baseline.     GCS: GCS eye subscore is 4. GCS verbal subscore is 5. GCS motor subscore is 6.     Cranial Nerves: No cranial nerve deficit.     Sensory: No sensory deficit.     Motor: Motor function is intact.  Psychiatric:        Attention and Perception: Attention normal.        Speech: Speech normal.        Behavior:  Behavior normal.     ED Results / Procedures / Treatments   Labs (all labs ordered are listed, but only abnormal results are displayed) Labs Reviewed  COMPREHENSIVE METABOLIC PANEL  CBC WITH DIFFERENTIAL/PLATELET  RETICULOCYTES  HCG, SERUM, QUALITATIVE    EKG None  Radiology No results found.  Procedures Procedures    Medications Ordered in ED Medications  ketorolac (TORADOL) 15 MG/ML injection  15 mg (has no administration in time range)  0.45 % sodium chloride infusion (has no administration in time range)  HYDROmorphone (DILAUDID) injection 2 mg (has no administration in time range)  HYDROmorphone (DILAUDID) injection 2 mg (has no administration in time range)  HYDROmorphone (DILAUDID) injection 2 mg (has no administration in time range)  diphenhydrAMINE (BENADRYL) 12.5 mg in sodium chloride 0.9 % 50 mL IVPB (has no administration in time range)  ondansetron (ZOFRAN) injection 4 mg (has no administration in time range)    ED Course/ Medical Decision Making/ A&P                                 Medical Decision Making Amount and/or Complexity of Data Reviewed Labs: ordered.  Risk Prescription drug management.   Patient here with sickle cell pain crisis.  Hemoglobin appears to be stable at this time.  Labs are otherwise reassuring.  Was given IV hydromorphone x 3.  Patient also given IV fluids and states he feels better.  She declines admission at this time.  Will discharge home and she will get her home opiate prescription filled        Final Clinical Impression(s) / ED Diagnoses Final diagnoses:  None    Rx / DC Orders ED Discharge Orders     None         Lorre Nick, MD 08/06/23 1233

## 2023-08-06 NOTE — ED Triage Notes (Signed)
Pt arrived via POV. C/o bilat leg and arm pain for 2x days. Home meds not helping.  AOx4

## 2023-08-06 NOTE — Progress Notes (Signed)
Specialty Pharmacy Refill Coordination Note  Molly Gutierrez is a 43 y.o. female contacted today regarding refills of specialty medication(s) Deferiprone   Patient requested (Patient-Rptd) Delivery   Delivery date:  (medication will need to be ordered and patient has a UPS add. will mail when medication is in stock. - lvm for patient regarding delay)   Verified address: (Patient-Rptd) [address] [place] [place]  [address]   Medication will be filled on 02.20.25 or when medication is in stock.

## 2023-08-06 NOTE — Telephone Encounter (Signed)
Copied from CRM [id number]. Topic: Clinical - Medication Refill >> Aug 06, 2023  1:22 PM Phill Myron wrote: Most Recent Primary Care Visit:  Provider: Mariam Dollar VISIT  Department: [facility]  Visit Type: MEDICARE AWV, SEQUENTIAL  Date: 07/24/2023  Medication: HYDROmorphone (DILAUDID) 4 MG tablet  Has the patient contacted their pharmacy? Yes (Agent: If no, request that the patient contact the pharmacy for the refill. If patient does not wish to contact the pharmacy document the reason why and proceed with request.) (Agent: If yes, when and what did the pharmacy advise?)  Is this the correct pharmacy for this prescription? Yes If no, delete pharmacy and type the correct one.  This is the patient's preferred pharmacy:  [facility] [address] [place] [address] Phone: [phone] Fax: [phone]    Is the patient out of the medication? Yes  Has the patient been seen for an appointment in the last year OR does the patient have an upcoming appointment? Yes  Can we respond through MyChart? Yes  Agent: Please be advised that Rx refills may take up to 3 business days. We ask that you follow-up with your pharmacy.

## 2023-08-06 NOTE — Telephone Encounter (Signed)
  Chief Complaint: Medication problem  Disposition: [] ED /[] Urgent Care (no appt availability in office) / [] Appointment(In office/virtual)/ []  [place] Virtual [facility]/ [] Home Care/ [] Refused Recommended Disposition /[]  Mobile Bus/ [x]  Follow-up with PCP Additional Notes: Patient calls stating she was just d/c'd from ER and that PCP delayed release of pain medication until tomorrow and she is out of medication. This RN contacted PCP who advised patient is unable to refill until tomorrow. This RN attempted to contact patient back with information, patient did not answer, unable to leave voicemail. Alerting PCP for review.   Reason for Disposition  [1] Prescription refill request for ESSENTIAL medicine (i.e., likelihood of harm to patient if not taken) AND [2] triager unable to refill per department policy  Answer Assessment - Initial Assessment Questions 1. DRUG NAME: "What medicine do you need to have refilled?"     HYDROmorphone (DILAUDID) 4 MG tablet 2. REFILLS REMAINING: "How many refills are remaining?" (Note: The label on the medicine or pill bottle will show how many refills are remaining. If there are no refills remaining, then a renewal may be needed.)     No medication left 3. EXPIRATION DATE: "What is the expiration date?" (Note: The label states when the prescription will expire, and thus can no longer be refilled.)     NA 4. PRESCRIBING HCP: "Who prescribed it?" Reason: If prescribed by specialist, call should be referred to that group.     PCP 5. SYMPTOMS: "Do you have any symptoms?"     Pain  Protocols used: Medication Refill and Renewal Call-A-AH

## 2023-08-07 ENCOUNTER — Other Ambulatory Visit: Payer: Self-pay

## 2023-08-07 ENCOUNTER — Other Ambulatory Visit (HOSPITAL_COMMUNITY): Payer: Self-pay

## 2023-08-07 ENCOUNTER — Telehealth: Payer: Self-pay

## 2023-08-07 NOTE — Transitions of Care (Post Inpatient/ED Visit) (Signed)
   08/07/2023  Name: Molly Gutierrez MRN: [id number] DOB: Jun 29, 1980  Today's TOC FU Call Status: Today's TOC FU Call Status:: Successful TOC FU Call Completed TOC FU Call Complete Date: 08/07/23 Patient's Name and Date of Birth confirmed.  Transition Care Management Follow-up Telephone Call Date of Discharge: 07/29/23 Discharge Facility: Other ([facility] Facility) Name of Other ([facility]) Discharge Facility: [facility] Type of Discharge: Inpatient Admission Primary Inpatient Discharge Diagnosis:: Sickle Cell Anemia Crisis How have you been since you were released from the hospital?: Better Any questions or concerns?: No  Items Reviewed: Did you receive and understand the discharge instructions provided?: Yes Medications obtained,verified, and reconciled?: Yes (Medications Reviewed) Any new allergies since your discharge?: No Dietary orders reviewed?: NA Do you have support at home?: Yes People in Home: parent(s)  Medications Reviewed Today: Medications Reviewed Today   Medications were not reviewed in this encounter     Home Care and Equipment/Supplies: Were Home Health Services Ordered?: NA Any new equipment or medical supplies ordered?: NA  Functional Questionnaire: Do you need assistance with bathing/showering or dressing?: No Do you need assistance with meal preparation?: No Do you need assistance with eating?: No Do you have difficulty maintaining continence: No Do you need assistance with getting out of bed/getting out of a chair/moving?: No Do you have difficulty managing or taking your medications?: No  Follow up appointments reviewed: PCP Follow-up appointment confirmed?: Yes Date of PCP follow-up appointment?: 08/22/23 Follow-up Provider: Angus Seller, NP Specialist Hospital Follow-up appointment confirmed?: NA Do you need transportation to your follow-up appointment?: No Do you understand care options if your condition(s) worsen?: Yes-patient  verbalized understanding  SDOH Interventions Today    Flowsheet Row Most Recent Value  SDOH Interventions   Food Insecurity Interventions Intervention Not Indicated  Housing Interventions Intervention Not Indicated  Utilities Interventions Intervention Not Indicated      Interventions Today    Flowsheet Row Most Recent Value  Chronic Disease   Chronic disease during today's visit --  [Sickle Cell Anemia]  5 Emergency Room Visits 2025, 2 Inpatient Admissions 2025  General Interventions   General Interventions Discussed/Reviewed General Interventions Discussed, General Interventions Reviewed, Doctor Visits  Doctor Visits Discussed/Reviewed Doctor Visits Discussed, Doctor Visits Reviewed, PCP  PCP/Specialist Visits Compliance with follow-up visit  Exercise Interventions   Exercise Discussed/Reviewed Physical Activity  [Increase activity slowly]  Physical Activity Discussed/Reviewed Physical Activity Discussed, Physical Activity Reviewed  [Increase activity slowly, ]  Education Interventions   Education Provided Provided Education  Provided Verbal Education On Medication, When to see the doctor, Nutrition, Other  [Maintain and monitor hydration status.]  Nutrition Interventions   Nutrition Discussed/Reviewed Fluid intake, Nutrition Reviewed  Pharmacy Interventions   Pharmacy Dicussed/Reviewed Medications and their functions, Medication Adherence       TOC Interventions Today    Flowsheet Row Most Recent Value  TOC Interventions   TOC Interventions Discussed/Reviewed TOC Interventions Discussed, TOC Interventions Reviewed, Post discharge activity limitations per provider, S/S of infection       Reviewed/discussed TOC 30-day program - Patient declines to enroll at this time.  RNCM contact information provided for any health-related questions, concerns or needs that may arise after today's telephone call.  Wyline Mood BSN, RN Medical illustrator, Transition of Care [place] /  [facility], Population Health Direct Dial:  [phone] / Fax: [phone] Email:  [email or web address] Website: [email or web address]

## 2023-08-14 DIAGNOSIS — D5700 Hb-SS disease with crisis, unspecified: Secondary | ICD-10-CM | POA: Diagnosis not present

## 2023-08-14 DIAGNOSIS — Z7951 Long term (current) use of inhaled steroids: Secondary | ICD-10-CM | POA: Diagnosis not present

## 2023-08-14 DIAGNOSIS — R0789 Other chest pain: Secondary | ICD-10-CM | POA: Diagnosis not present

## 2023-08-14 DIAGNOSIS — Z86711 Personal history of pulmonary embolism: Secondary | ICD-10-CM | POA: Diagnosis not present

## 2023-08-14 DIAGNOSIS — K219 Gastro-esophageal reflux disease without esophagitis: Secondary | ICD-10-CM | POA: Diagnosis not present

## 2023-08-14 DIAGNOSIS — F1120 Opioid dependence, uncomplicated: Secondary | ICD-10-CM | POA: Diagnosis not present

## 2023-08-14 DIAGNOSIS — Z79899 Other long term (current) drug therapy: Secondary | ICD-10-CM | POA: Diagnosis not present

## 2023-08-14 DIAGNOSIS — J45909 Unspecified asthma, uncomplicated: Secondary | ICD-10-CM | POA: Diagnosis not present

## 2023-08-14 DIAGNOSIS — Z7901 Long term (current) use of anticoagulants: Secondary | ICD-10-CM | POA: Diagnosis not present

## 2023-08-14 DIAGNOSIS — I2699 Other pulmonary embolism without acute cor pulmonale: Secondary | ICD-10-CM | POA: Diagnosis not present

## 2023-08-14 DIAGNOSIS — R079 Chest pain, unspecified: Secondary | ICD-10-CM | POA: Diagnosis not present

## 2023-08-17 ENCOUNTER — Other Ambulatory Visit: Payer: Self-pay | Admitting: Nurse Practitioner

## 2023-08-17 DIAGNOSIS — D5700 Hb-SS disease with crisis, unspecified: Secondary | ICD-10-CM

## 2023-08-18 MED ORDER — HYDROMORPHONE HCL 4 MG PO TABS
4.0000 mg | ORAL_TABLET | Freq: Four times a day (QID) | ORAL | 0 refills | Status: DC | PRN
  Filled 2023-08-18: qty 60, 15d supply, fill #0

## 2023-08-18 NOTE — Telephone Encounter (Signed)
 Please advise [facility]

## 2023-08-19 ENCOUNTER — Other Ambulatory Visit: Payer: Self-pay

## 2023-08-19 ENCOUNTER — Other Ambulatory Visit (HOSPITAL_COMMUNITY): Payer: Self-pay

## 2023-08-22 ENCOUNTER — Telehealth: Payer: Self-pay | Admitting: *Deleted

## 2023-08-22 ENCOUNTER — Ambulatory Visit: Payer: Self-pay | Admitting: Nurse Practitioner

## 2023-08-22 NOTE — Transitions of Care (Post Inpatient/ED Visit) (Signed)
   08/22/2023  Name: Molly Gutierrez MRN: [id number] DOB: 10-10-1980  Today's TOC FU Call Status: Today's TOC FU Call Status:: Unsuccessful Call (1st Attempt) Unsuccessful Call (1st Attempt) Date: 08/22/23  Attempted to reach the patient regarding the most recent Inpatient visit; left HIPAA compliant voice message requesting call back  Follow Up Plan: Additional outreach attempts will be made to reach the patient to complete the Transitions of Care (Post Inpatient visit) call.   Pls call/ message for questions,  Caryl Pina, RN, BSN, CCRN Alumnus RN Care Manager  Transitions of Care  [facility] [phone]: direct office

## 2023-08-26 ENCOUNTER — Telehealth: Payer: Self-pay

## 2023-08-26 NOTE — Transitions of Care (Post Inpatient/ED Visit) (Signed)
   08/26/2023  Name: Molly Gutierrez MRN: [id number] DOB: 01-06-1981  Today's TOC FU Call Status: Today's TOC FU Call Status:: Unsuccessful Call (2nd Attempt) Unsuccessful Call (2nd Attempt) Date: 08/26/23  Attempted to reach the patient regarding the most recent Inpatient/ED visit.  Follow Up Plan: Additional outreach attempts will be made to reach the patient to complete the Transitions of Care (Post Inpatient/ED visit) call.   Avaya Mcjunkins A. Mliss Fritz RN, BA, [facility], CRRN [facility] RN Care Manager, Transition of Care [phone]

## 2023-08-28 DIAGNOSIS — E876 Hypokalemia: Secondary | ICD-10-CM | POA: Diagnosis not present

## 2023-08-28 DIAGNOSIS — D75839 Thrombocytosis, unspecified: Secondary | ICD-10-CM | POA: Diagnosis not present

## 2023-08-28 DIAGNOSIS — R112 Nausea with vomiting, unspecified: Secondary | ICD-10-CM | POA: Diagnosis not present

## 2023-08-28 DIAGNOSIS — D5700 Hb-SS disease with crisis, unspecified: Secondary | ICD-10-CM | POA: Diagnosis not present

## 2023-08-31 ENCOUNTER — Encounter (HOSPITAL_COMMUNITY): Payer: Self-pay | Admitting: Internal Medicine

## 2023-08-31 ENCOUNTER — Inpatient Hospital Stay (HOSPITAL_COMMUNITY)
Admission: EM | Admit: 2023-08-31 | Discharge: 2023-09-04 | DRG: 812 | Disposition: A | Discharge status: Home / Self Care | Attending: Internal Medicine | Admitting: Internal Medicine

## 2023-08-31 ENCOUNTER — Other Ambulatory Visit: Payer: Self-pay

## 2023-08-31 ENCOUNTER — Emergency Department (HOSPITAL_COMMUNITY)

## 2023-08-31 DIAGNOSIS — Z83438 Family history of other disorder of lipoprotein metabolism and other lipidemia: Secondary | ICD-10-CM | POA: Diagnosis not present

## 2023-08-31 DIAGNOSIS — D5700 Hb-SS disease with crisis, unspecified: Secondary | ICD-10-CM | POA: Diagnosis present

## 2023-08-31 DIAGNOSIS — Z862 Personal history of diseases of the blood and blood-forming organs and certain disorders involving the immune mechanism: Secondary | ICD-10-CM

## 2023-08-31 DIAGNOSIS — Z833 Family history of diabetes mellitus: Secondary | ICD-10-CM | POA: Diagnosis not present

## 2023-08-31 DIAGNOSIS — F1120 Opioid dependence, uncomplicated: Secondary | ICD-10-CM | POA: Diagnosis present

## 2023-08-31 DIAGNOSIS — D638 Anemia in other chronic diseases classified elsewhere: Secondary | ICD-10-CM | POA: Diagnosis present

## 2023-08-31 DIAGNOSIS — R079 Chest pain, unspecified: Secondary | ICD-10-CM | POA: Diagnosis not present

## 2023-08-31 DIAGNOSIS — Z841 Family history of disorders of kidney and ureter: Secondary | ICD-10-CM | POA: Diagnosis not present

## 2023-08-31 DIAGNOSIS — Z8249 Family history of ischemic heart disease and other diseases of the circulatory system: Secondary | ICD-10-CM | POA: Diagnosis not present

## 2023-08-31 DIAGNOSIS — K219 Gastro-esophageal reflux disease without esophagitis: Secondary | ICD-10-CM | POA: Diagnosis present

## 2023-08-31 DIAGNOSIS — F411 Generalized anxiety disorder: Secondary | ICD-10-CM | POA: Insufficient documentation

## 2023-08-31 DIAGNOSIS — Z7951 Long term (current) use of inhaled steroids: Secondary | ICD-10-CM

## 2023-08-31 DIAGNOSIS — Z79899 Other long term (current) drug therapy: Secondary | ICD-10-CM | POA: Diagnosis not present

## 2023-08-31 DIAGNOSIS — G894 Chronic pain syndrome: Secondary | ICD-10-CM | POA: Diagnosis present

## 2023-08-31 DIAGNOSIS — Z86711 Personal history of pulmonary embolism: Secondary | ICD-10-CM | POA: Diagnosis not present

## 2023-08-31 DIAGNOSIS — Z7901 Long term (current) use of anticoagulants: Secondary | ICD-10-CM

## 2023-08-31 DIAGNOSIS — J4520 Mild intermittent asthma, uncomplicated: Secondary | ICD-10-CM | POA: Diagnosis present

## 2023-08-31 DIAGNOSIS — Z8709 Personal history of other diseases of the respiratory system: Secondary | ICD-10-CM

## 2023-08-31 LAB — CBC WITH DIFFERENTIAL/PLATELET
Abs Immature Granulocytes: 0.07 10*3/uL (ref 0.00–0.07)
Abs Immature Granulocytes: 0.08 10*3/uL — ABNORMAL HIGH (ref 0.00–0.07)
Basophils Absolute: 0.1 10*3/uL (ref 0.0–0.1)
Basophils Absolute: 0.1 10*3/uL (ref 0.0–0.1)
Basophils Relative: 1 %
Basophils Relative: 1 %
Eosinophils Absolute: 0.6 10*3/uL — ABNORMAL HIGH (ref 0.0–0.5)
Eosinophils Absolute: 0.6 10*3/uL — ABNORMAL HIGH (ref 0.0–0.5)
Eosinophils Relative: 4 %
Eosinophils Relative: 5 %
HCT: 23.2 % — ABNORMAL LOW (ref 36.0–46.0)
HCT: 20 % — ABNORMAL LOW (ref 36.0–46.0)
Hemoglobin: 7.9 g/dL — ABNORMAL LOW (ref 12.0–15.0)
Hemoglobin: 7 g/dL — ABNORMAL LOW (ref 12.0–15.0)
Immature Granulocytes: 1 %
Immature Granulocytes: 1 %
Lymphocytes Relative: 21 %
Lymphocytes Relative: 26 %
Lymphs Abs: 2.9 10*3/uL (ref 0.7–4.0)
Lymphs Abs: 3.6 10*3/uL (ref 0.7–4.0)
MCH: 31.5 pg (ref 26.0–34.0)
MCH: 31.8 pg (ref 26.0–34.0)
MCHC: 34.1 g/dL (ref 30.0–36.0)
MCHC: 35 g/dL (ref 30.0–36.0)
MCV: 92.4 fL (ref 80.0–100.0)
MCV: 90.9 fL (ref 80.0–100.0)
Monocytes Absolute: 1.7 10*3/uL — ABNORMAL HIGH (ref 0.1–1.0)
Monocytes Absolute: 1.8 10*3/uL — ABNORMAL HIGH (ref 0.1–1.0)
Monocytes Relative: 13 %
Monocytes Relative: 13 %
Neutro Abs: 8.2 10*3/uL — ABNORMAL HIGH (ref 1.7–7.7)
Neutro Abs: 7.8 10*3/uL — ABNORMAL HIGH (ref 1.7–7.7)
Neutrophils Relative %: 60 %
Neutrophils Relative %: 54 %
Platelets: 485 10*3/uL — ABNORMAL HIGH (ref 150–400)
Platelets: 353 10*3/uL (ref 150–400)
RBC: 2.51 MIL/uL — ABNORMAL LOW (ref 3.87–5.11)
RBC: 2.2 MIL/uL — ABNORMAL LOW (ref 3.87–5.11)
RDW: 22 % — ABNORMAL HIGH (ref 11.5–15.5)
RDW: 21.3 % — ABNORMAL HIGH (ref 11.5–15.5)
WBC: 13.6 10*3/uL — ABNORMAL HIGH (ref 4.0–10.5)
WBC: 13.9 10*3/uL — ABNORMAL HIGH (ref 4.0–10.5)
nRBC: 0.8 % — ABNORMAL HIGH (ref 0.0–0.2)
nRBC: 0.6 % — ABNORMAL HIGH (ref 0.0–0.2)

## 2023-08-31 LAB — BASIC METABOLIC PANEL
Anion gap: 7 (ref 5–15)
BUN: 7 mg/dL (ref 6–20)
CO2: 23 mmol/L (ref 22–32)
Calcium: 8.7 mg/dL — ABNORMAL LOW (ref 8.9–10.3)
Chloride: 106 mmol/L (ref 98–111)
Creatinine, Ser: 0.46 mg/dL (ref 0.44–1.00)
GFR, Estimated: >60 mL/min (ref >60)
Glucose, Bld: 100 mg/dL — ABNORMAL HIGH (ref 70–99)
Potassium: 3.8 mmol/L (ref 3.5–5.1)
Sodium: 136 mmol/L (ref 135–145)

## 2023-08-31 LAB — COMPREHENSIVE METABOLIC PANEL
ALT: 13 U/L (ref 0–44)
AST: 31 U/L (ref 15–41)
Albumin: 3.9 g/dL (ref 3.5–5.0)
Alkaline Phosphatase: 49 U/L (ref 38–126)
Anion gap: 6 (ref 5–15)
BUN: 7 mg/dL (ref 6–20)
CO2: 24 mmol/L (ref 22–32)
Calcium: 8.6 mg/dL — ABNORMAL LOW (ref 8.9–10.3)
Chloride: 106 mmol/L (ref 98–111)
Creatinine, Ser: 0.53 mg/dL (ref 0.44–1.00)
GFR, Estimated: >60 mL/min (ref >60)
Glucose, Bld: 112 mg/dL — ABNORMAL HIGH (ref 70–99)
Potassium: 3.9 mmol/L (ref 3.5–5.1)
Sodium: 136 mmol/L (ref 135–145)
Total Bilirubin: 3.5 mg/dL — ABNORMAL HIGH (ref 0.0–1.2)
Total Protein: 7.6 g/dL (ref 6.5–8.1)

## 2023-08-31 LAB — HCG, SERUM, QUALITATIVE: Preg, Serum: NEGATIVE

## 2023-08-31 LAB — RETICULOCYTES
Immature Retic Fract: 20.2 % — ABNORMAL HIGH (ref 2.3–15.9)
RBC.: 2.53 MIL/uL — ABNORMAL LOW (ref 3.87–5.11)
Retic Count, Absolute: 406.5 10*3/uL — ABNORMAL HIGH (ref 19.0–186.0)
Retic Ct Pct: 16.3 % — ABNORMAL HIGH (ref 0.4–3.1)

## 2023-08-31 LAB — TROPONIN I (HIGH SENSITIVITY)
Troponin I (High Sensitivity): 3 ng/L (ref <18)
Troponin I (High Sensitivity): 2 ng/L (ref <18)

## 2023-08-31 MED ORDER — KETOROLAC TROMETHAMINE 15 MG/ML IJ SOLN: 15.0000 mg | Freq: Four times a day (QID) | INTRAMUSCULAR | Status: DC

## 2023-08-31 MED ORDER — MIRTAZAPINE 30 MG PO TABS
45.0000 mg | ORAL_TABLET | Freq: Every day | ORAL | Status: DC
  Administered 2023-08-31 – 2023-09-03 (×4): 45 mg via ORAL
  Filled 2023-08-31 (×4): qty 1

## 2023-08-31 MED ORDER — NALOXONE HCL 0.4 MG/ML IJ SOLN: 0.4000 mg | INTRAMUSCULAR | Status: DC | PRN

## 2023-08-31 MED ORDER — VITAMIN B-12 100 MCG PO TABS
100.0000 ug | ORAL_TABLET | Freq: Every day | ORAL | Status: DC
  Administered 2023-08-31 – 2023-09-04 (×5): 100 ug via ORAL
  Filled 2023-08-31 (×6): qty 1

## 2023-08-31 MED ORDER — SODIUM CHLORIDE 0.9% FLUSH: 3.0000 mL | INTRAVENOUS | Status: DC | PRN

## 2023-08-31 MED ORDER — SODIUM CHLORIDE 0.9 % IV SOLN: 250.0000 mL | INTRAVENOUS | Status: DC | PRN

## 2023-08-31 MED ORDER — SODIUM CHLORIDE 0.9% FLUSH: 10.0000 mL | INTRAVENOUS | Status: DC | PRN

## 2023-08-31 MED ORDER — CHLORHEXIDINE GLUCONATE CLOTH 2 % EX PADS
6.0000 | MEDICATED_PAD | Freq: Every day | CUTANEOUS | Status: DC
  Administered 2023-09-01 – 2023-09-03 (×3): 6 via TOPICAL

## 2023-08-31 MED ORDER — RIVAROXABAN 20 MG PO TABS
20.0000 mg | ORAL_TABLET | Freq: Every evening | ORAL | Status: DC
  Administered 2023-08-31 – 2023-09-03 (×4): 20 mg via ORAL
  Filled 2023-08-31 (×4): qty 1

## 2023-08-31 MED ORDER — HYDROMORPHONE HCL 1 MG/ML IJ SOLN
2.0000 mg | INTRAMUSCULAR | Status: AC
  Administered 2023-08-31: 2 mg via INTRAVENOUS
  Filled 2023-08-31: qty 2

## 2023-08-31 MED ORDER — POLYETHYLENE GLYCOL 3350 17 G PO PACK: 17.0000 g | PACK | Freq: Every day | ORAL | Status: DC | PRN

## 2023-08-31 MED ORDER — SODIUM CHLORIDE 0.45 % IV SOLN: INTRAVENOUS | Status: DC

## 2023-08-31 MED ORDER — HYDROMORPHONE HCL 1 MG/ML IJ SOLN
2.0000 mg | INTRAMUSCULAR | Status: AC | PRN
  Administered 2023-08-31 (×2): 2 mg via INTRAVENOUS
  Filled 2023-08-31 (×2): qty 2

## 2023-08-31 MED ORDER — SODIUM CHLORIDE 0.9% FLUSH: 9.0000 mL | INTRAVENOUS | Status: DC | PRN

## 2023-08-31 MED ORDER — ONDANSETRON HCL 4 MG/2ML IJ SOLN
4.0000 mg | INTRAMUSCULAR | Status: DC | PRN
  Administered 2023-08-31 (×2): 4 mg via INTRAVENOUS
  Filled 2023-08-31 (×2): qty 2

## 2023-08-31 MED ORDER — SODIUM CHLORIDE 0.9 % IV SOLN
12.5000 mg | Freq: Once | INTRAVENOUS | Status: AC
  Administered 2023-08-31: 12.5 mg via INTRAVENOUS
  Filled 2023-08-31: qty 12.5

## 2023-08-31 MED ORDER — FOLIC ACID 1 MG PO TABS
1.0000 mg | ORAL_TABLET | Freq: Every day | ORAL | Status: DC
  Administered 2023-08-31 – 2023-09-04 (×5): 1 mg via ORAL
  Filled 2023-08-31 (×5): qty 1

## 2023-08-31 MED ORDER — HYDROMORPHONE 1 MG/ML IV SOLN
INTRAVENOUS | Status: AC
  Administered 2023-08-31: 1.7 mg via INTRAVENOUS
  Administered 2023-08-31: 6 mg via INTRAVENOUS
  Administered 2023-09-01: 30 mg via INTRAVENOUS
  Administered 2023-09-01: 0.5 mg via INTRAVENOUS
  Administered 2023-09-01: 2 mg via INTRAVENOUS
  Administered 2023-09-01: 3 mg via INTRAVENOUS
  Administered 2023-09-01: 3.5 mg via INTRAVENOUS
  Administered 2023-09-01: 4.5 mg via INTRAVENOUS
  Filled 2023-08-31: qty 30

## 2023-08-31 MED ORDER — FAMOTIDINE IN NACL 20-0.9 MG/50ML-% IV SOLN
20.0000 mg | INTRAVENOUS | Status: DC
  Administered 2023-08-31 – 2023-09-01 (×2): 20 mg via INTRAVENOUS
  Filled 2023-08-31 (×2): qty 50

## 2023-08-31 MED ORDER — HYDROMORPHONE 1 MG/ML IV SOLN
INTRAVENOUS | Status: DC
  Filled 2023-08-31: qty 30

## 2023-08-31 MED ORDER — SENNOSIDES-DOCUSATE SODIUM 8.6-50 MG PO TABS
1.0000 | ORAL_TABLET | Freq: Two times a day (BID) | ORAL | Status: DC
  Administered 2023-08-31 – 2023-09-04 (×9): 1 via ORAL
  Filled 2023-08-31 (×9): qty 1

## 2023-08-31 MED ORDER — KETOROLAC TROMETHAMINE 15 MG/ML IJ SOLN
15.0000 mg | Freq: Four times a day (QID) | INTRAMUSCULAR | Status: AC
  Administered 2023-08-31 – 2023-09-02 (×8): 15 mg via INTRAVENOUS
  Filled 2023-08-31 (×8): qty 1

## 2023-08-31 MED ORDER — SODIUM CHLORIDE 0.9% FLUSH
3.0000 mL | Freq: Two times a day (BID) | INTRAVENOUS | Status: DC
  Administered 2023-08-31: 3 mL via INTRAVENOUS

## 2023-08-31 MED ORDER — DIPHENHYDRAMINE HCL 25 MG PO CAPS
25.0000 mg | ORAL_CAPSULE | ORAL | Status: DC | PRN
  Administered 2023-08-31 – 2023-09-03 (×3): 25 mg via ORAL
  Filled 2023-08-31 (×3): qty 1

## 2023-08-31 MED ORDER — VITAMIN D 25 MCG (1000 UNIT) PO TABS
1000.0000 [IU] | ORAL_TABLET | Freq: Every day | ORAL | Status: DC
  Administered 2023-08-31 – 2023-09-04 (×5): 1000 [IU] via ORAL
  Filled 2023-08-31 (×5): qty 1

## 2023-08-31 MED ORDER — MOMETASONE FURO-FORMOTEROL FUM 100-5 MCG/ACT IN AERO
2.0000 | INHALATION_SPRAY | Freq: Two times a day (BID) | RESPIRATORY_TRACT | Status: DC
  Filled 2023-08-31 (×2): qty 8.8

## 2023-08-31 MED ORDER — ALBUTEROL SULFATE HFA 108 (90 BASE) MCG/ACT IN AERS: 2.0000 | INHALATION_SPRAY | Freq: Four times a day (QID) | RESPIRATORY_TRACT | Status: DC | PRN

## 2023-08-31 MED ORDER — ALBUTEROL SULFATE (2.5 MG/3ML) 0.083% IN NEBU: 2.5000 mg | INHALATION_SOLUTION | Freq: Four times a day (QID) | RESPIRATORY_TRACT | Status: DC | PRN

## 2023-08-31 MED ORDER — LACTATED RINGERS IV SOLN: INTRAVENOUS | Status: DC

## 2023-08-31 NOTE — ED Provider Notes (Signed)
 [place] [facility]PARTMENT AT [facility] Provider Note   CSN: [id number] Arrival date & time: 08/31/23  0032     History  Chief Complaint  Patient presents with   Sickle Cell Pain Crisis    Molly Gutierrez is a 43 y.o. female.  Is a 43 year old female presenting emergency department with sickle cell pain.  Reports center of chest.  Not uncommon for sickle cell pain location for her.  Symptoms been present since Monday.  She has been rationing her Dilaudid as she has been running low and thinks her pain is gotten out of control.  Denies fevers chills no shortness of breath.  No abdominal pain.   Sickle Cell Pain Crisis      Home Medications Prior to Admission medications   Medication Sig Start Date End Date Taking? Authorizing Provider  acetaminophen (TYLENOL) 500 MG tablet Take 500 mg by mouth every 6 (six) hours as needed for mild pain or headache (or cramps).   Yes [provider]  albuterol (VENTOLIN HFA) 108 (90 Base) MCG/ACT inhaler TAKE 2 PUFFS BY MOUTH EVERY 6 HOURS AS NEEDED FOR WHEEZE OR SHORTNESS OF BREATH Patient taking differently: Inhale 2 puffs into the lungs every 6 (six) hours as needed for wheezing or shortness of breath. 01/22/23  Yes [name], NP  budesonide-formoterol (SYMBICORT) 80-4.5 MCG/ACT inhaler INHALE 2 PUFFS INTO THE LUNGS TWICE A DAY Patient taking differently: Inhale 2 puffs into the lungs 2 (two) times daily as needed. 01/27/23  Yes [name], NP  Cholecalciferol (VITAMIN D3 PO) Take 1 capsule by mouth at bedtime.   Yes [provider]  Cyanocobalamin (VITAMIN B-12 PO) Take 1 tablet by mouth at bedtime.   Yes [provider]  Deferiprone (FERRIPROX) 500 MG TABS Take 500 mg by mouth 2 (two) times daily. Patient taking differently: Take 1,500 mg by mouth daily. 05/14/23  Yes Massie Maroon, FNP  diphenhydrAMINE (BENADRYL) 25 MG tablet Take 25 mg by mouth See admin instructions. Take 25  mg by mouth every four to six hours with each dose of Hydromorphone   Yes [provider]  folic acid (FOLVITE) 1 MG tablet Take 1 tablet (1 mg total) by mouth daily. 12/23/22  Yes [name], NP  HYDROmorphone (DILAUDID) 4 MG tablet Take 1 tablet (4 mg total) by mouth every 6 (six) hours as needed for up to 15 days for severe pain (pain score 7-10). 08/18/23 09/04/23 Yes [name], NP  mirtazapine (REMERON) 45 MG tablet Take 1 tablet (45 mg total) by mouth at bedtime. 12/23/22 12/23/23 Yes [name], NP  naloxone [facility]) nasal spray 4 mg/0.1 mL Place 0.1 sprays (0.4 mg total) into the nose once as needed (opioid overdose). 12/23/22  Yes [name], NP  polyethylene glycol (MIRALAX / GLYCOLAX) 17 g packet Take 17 g by mouth daily as needed for mild constipation (MIX AS DIRECTED AND DRINK). 12/23/22  Yes [name], NP  promethazine (PHENERGAN) 25 MG tablet Take 0.5-1 tablets (12.5-25 mg total) by mouth every 6 (six) hours as needed for nausea or vomiting. 08/06/23 08/05/24 Yes [name], NP  rivaroxaban (XARELTO) 20 MG TABS tablet Take 20 mg by mouth every evening.   Yes [provider]  Vitamin D, Ergocalciferol, (DRISDOL) 1.25 MG (50000 UNIT) CAPS capsule Take 1 capsule (50,000 Units total) by mouth once a week. Patient taking differently: Take 50,000 Units by mouth every Wednesday. 12/23/22 12/23/23 Yes Angus Seller  S, NP  fluticasone (FLONASE) 50 MCG/ACT nasal spray Place 1 spray into both nostrils daily as needed for allergies or rhinitis. 12/31/21 05/05/23  [name], NP  XARELTO 20 MG TABS tablet Take 1 tablet (20 mg total) by mouth daily with supper. Patient taking differently: Take 20 mg by mouth at bedtime. 10/29/21 06/02/23  [name], NP      Allergies    Cefaclor, Hydroxyurea, Omeprazole, and Ketamine    Review of Systems   Review of Systems  Physical Exam Updated Vital Signs BP 102/70 (BP Location: Left Arm)   Pulse 85   Temp  98.5 F (36.9 C) (Oral)   Resp 18   Ht 5\' 3"  (1.6 m)   Wt 56.4 kg   LMP 08/15/2023 (Exact Date)   SpO2 97%   BMI 22.04 kg/m  Physical Exam Vitals and nursing note reviewed.  Constitutional:      General: She is not in acute distress.    Appearance: She is not toxic-appearing.     Comments: Uncomfortable appearing  HENT:     Head: Normocephalic and atraumatic.     Nose: Nose normal.  Eyes:     Conjunctiva/sclera: Conjunctivae normal.  Cardiovascular:     Rate and Rhythm: Normal rate and regular rhythm.  Pulmonary:     Effort: Pulmonary effort is normal.     Breath sounds: Normal breath sounds.  Abdominal:     General: Abdomen is flat. There is no distension.     Palpations: Abdomen is soft.     Tenderness: There is no abdominal tenderness. There is no guarding or rebound.  Musculoskeletal:        General: Normal range of motion.  Skin:    General: Skin is warm.     Capillary Refill: Capillary refill takes less than 2 seconds.  Neurological:     Mental Status: She is alert and oriented to person, place, and time.  Psychiatric:        Mood and Affect: Mood normal.        Behavior: Behavior normal.     ED Results / Procedures / Treatments   Labs (all labs ordered are listed, but only abnormal results are displayed) Labs Reviewed  CBC WITH DIFFERENTIAL/PLATELET - Abnormal; Notable for the following components:      Result Value   WBC 13.6 (*)    RBC 2.51 (*)    Hemoglobin 7.9 (*)    HCT 23.2 (*)    RDW 22.0 (*)    Platelets 485 (*)    nRBC 0.8 (*)    Neutro Abs 8.2 (*)    Monocytes Absolute 1.7 (*)    Eosinophils Absolute 0.6 (*)    All other components within normal limits  RETICULOCYTES - Abnormal; Notable for the following components:   Retic Ct Pct 16.3 (*)    RBC. 2.53 (*)    Retic Count, Absolute 406.5 (*)    Immature Retic Fract 20.2 (*)    All other components within normal limits  BASIC METABOLIC PANEL - Abnormal; Notable for the following  components:   Glucose, Bld 100 (*)    Calcium 8.7 (*)    All other components within normal limits  HCG, SERUM, QUALITATIVE  TROPONIN I (HIGH SENSITIVITY)  TROPONIN I (HIGH SENSITIVITY)    EKG None  Radiology DG Chest 2 View Result Date: 08/31/2023 CLINICAL DATA:  Sickle cell, chest pain EXAM: CHEST - 2 VIEW COMPARISON:  Radiograph 04/22/2023 FINDINGS: Stable cardiomediastinal silhouette.  Unchanged bilateral chest wall port. No focal consolidation, pleural effusion, or pneumothorax. No displaced rib fractures. IMPRESSION: No active cardiopulmonary disease. Electronically Signed   By: Minerva Fester M.D.   On: 08/31/2023 01:20    Procedures Procedures    Medications Ordered in ED Medications  ondansetron (ZOFRAN) injection 4 mg (4 mg Intravenous Given 08/31/23 0149)  HYDROmorphone (DILAUDID) injection 2 mg (2 mg Intravenous Given 08/31/23 0148)  HYDROmorphone (DILAUDID) injection 2 mg (2 mg Intravenous Given 08/31/23 0228)  HYDROmorphone (DILAUDID) injection 2 mg (2 mg Intravenous Given 08/31/23 0331)  diphenhydrAMINE (BENADRYL) 12.5 mg in sodium chloride 0.9 % 50 mL IVPB (0 mg Intravenous Stopped 08/31/23 0228)    ED Course/ Medical Decision Making/ A&P Clinical Course as of 08/31/23 0428  Sun Aug 31, 2023  0122 Last admission for sickle cell per chart review 2/27.  Was seen in the emergency department several days ago, hemoglobin 8.5 [TY]  0212 Hemoglobin(!): 7.9  Last hgb 8.5 per chart review  [TY]  0423 Troponin I (High Sensitivity): 2 ACS less likely [TY]  0423 Reticulocytes(!) Not aplastic crisis.  [TY]  0423 Basic metabolic panel(!) No metabolic derragements. No AKI.  [TY]  0423 Preg, Serum: NEGATIVE [TY]  0423 After multiple doses of IV narcotics with frequent re-assements, patient notes pain is still 8/10 and does not think she can manage her pain at home. Requesting admission for sickle cell pain crisis.  [TY]    Clinical Course User Index [TY] Coral Spikes, DO                                  Medical Decision Making Is a 43 year old female with past medical history significant for sickle cell disease presenting emergency department for sickle cell pain.  Complaining of chest pain.  She is afebrile nontachycardic maintaining oxygen saturation on room air.  Does appear uncomfortable on exam.  Chest pain seemingly a typical pain characteristic for her for her sickle cell crisis.  I suspect symptoms secondary to rationing her Dilaudid.  Per chart review [facility] 8.5.  Will repeat labs, give pain medications.  Reevaluate.  See ED course for final MDM disposition.  Amount and/or Complexity of Data Reviewed Labs: ordered. Decision-making details documented in ED Course. Radiology: ordered. ECG/medicine tests: ordered.  Risk Prescription drug management. Parenteral controlled substances. Decision regarding hospitalization.          Final Clinical Impression(s) / ED Diagnoses Final diagnoses:  None    Rx / DC Orders ED Discharge Orders     None         Coral Spikes, DO 08/31/23 7829

## 2023-08-31 NOTE — Plan of Care (Signed)

## 2023-08-31 NOTE — ED Triage Notes (Signed)
 SCC pain in central chest x Monday night , constant with no relief, home meds with no relief, today's visit prompted by worsening pain.

## 2023-08-31 NOTE — Progress Notes (Signed)
 Patient ID: Molly Gutierrez, female   DOB: March 28, 1981, 43 y.o.   MRN: [id number] Subjective: Molly Gutierrez is a 43 year old female with a medical history significant for sickle cell disease, chronic pain syndrome, anemia of chronic disease opiate dependence and tolerance, mild intermittent asthma, and history of PE on Xarelto who was admitted this morning for sickle cell pain crisis.   Patient just got up the progressive unit, she continues to endorse significant pain in her chest, lower back and lower extremities. Pain is at 9/10, constant and throbbing. She denies any fever, cough, SOB. She has nausea but no vomiting. No urinary symptom.   Objective:  Vital signs in last 24 hours:  Vitals:   08/31/23 1156 08/31/23 1241 08/31/23 1516 08/31/23 1607  BP: (!) 104/54   101/64  Pulse: 85   78  Resp: 13 14 14    Temp:    98.4 F (36.9 C)  TempSrc:    Oral  SpO2: 91%   (!) 89%  Weight:      Height:        Intake/Output from previous day:  Physical Exam: General: Alert, awake, oriented x3, in no acute distress.  HEENT: [place]/AT PEERL, EOMI Neck: Trachea midline,  no masses, no thyromegal,y no JVD, no carotid bruit OROPHARYNX:  Moist, No exudate/ erythema/lesions.  Heart: Regular rate and rhythm, without murmurs, rubs, gallops, PMI non-displaced, no heaves or thrills on palpation.  Lungs: Clear to auscultation, no wheezing or rhonchi noted. No increased vocal fremitus resonant to percussion  Abdomen: Soft, nontender, nondistended, positive bowel sounds, no masses no hepatosplenomegaly noted..  Neuro: No focal neurological deficits noted cranial nerves II through XII grossly intact. DTRs 2+ bilaterally upper and lower extremities. Strength 5 out of 5 in bilateral upper and lower extremities. Musculoskeletal: No warm swelling or erythema around joints, no spinal tenderness noted. Psychiatric: Patient alert and oriented x3, good insight and cognition, good recent to remote  recall. Lymph node survey: No cervical axillary or inguinal lymphadenopathy noted.  Lab Results:  Basic Metabolic Panel:    Component Value Date/Time   NA 136 08/31/2023 0544   NA 137 12/23/2022 1210   K 3.9 08/31/2023 0544   CL 106 08/31/2023 0544   CO2 24 08/31/2023 0544   BUN 7 08/31/2023 0544   BUN 5 (L) 12/23/2022 1210   CREATININE 0.53 08/31/2023 0544   GLUCOSE 112 (H) 08/31/2023 0544   CALCIUM 8.6 (L) 08/31/2023 0544   CBC:    Component Value Date/Time   WBC 13.9 (H) 08/31/2023 0544   HGB 7.0 (L) 08/31/2023 0544   HGB 7.9 (L) 12/23/2022 1210   HCT 20.0 (L) 08/31/2023 0544   HCT 24.4 (L) 12/23/2022 1210   PLT 353 08/31/2023 0544   PLT 554 (H) 12/23/2022 1210   MCV 90.9 08/31/2023 0544   MCV 94 12/23/2022 1210   NEUTROABS 7.8 (H) 08/31/2023 0544   NEUTROABS 10.4 (H) 12/23/2022 1210   LYMPHSABS 3.6 08/31/2023 0544   LYMPHSABS 2.9 12/23/2022 1210   MONOABS 1.8 (H) 08/31/2023 0544   EOSABS 0.6 (H) 08/31/2023 0544   EOSABS 0.2 12/23/2022 1210   BASOSABS 0.1 08/31/2023 0544   BASOSABS 0.1 12/23/2022 1210    No results found for this or any previous visit (from the past 240 hours).  Studies/Results: DG Chest 2 View Result Date: 08/31/2023 CLINICAL DATA:  Sickle cell, chest pain EXAM: CHEST - 2 VIEW COMPARISON:  Radiograph 04/22/2023 FINDINGS: Stable cardiomediastinal silhouette. Unchanged bilateral chest wall port. No focal consolidation, pleural  effusion, or pneumothorax. No displaced rib fractures. IMPRESSION: No active cardiopulmonary disease. Electronically Signed   By: Minerva Fester M.D.   On: 08/31/2023 01:20    Medications: Scheduled Meds:  cholecalciferol  1,000 Units Oral Daily   folic acid  1 mg Oral Daily   HYDROmorphone   Intravenous Q4H   ketorolac  15 mg Intravenous Q6H   mirtazapine  45 mg Oral QHS   mometasone-formoterol  2 puff Inhalation BID   rivaroxaban  20 mg Oral QPM   senna-docusate  1 tablet Oral BID   vitamin B-12  100 mcg Oral  Daily   Continuous Infusions:  sodium chloride 125 mL/hr at 08/31/23 1516   famotidine (PEPCID) IV Stopped (08/31/23 1008)   PRN Meds:.albuterol, diphenhydrAMINE, naloxone **AND** sodium chloride flush, ondansetron, polyethylene glycol  Consultants: None  Procedures: None  Antibiotics: None  Assessment/Plan: Principal Problem:   Sickle cell pain crisis (HCC) Active Problems:   History of pulmonary embolism   Gastro-esophageal reflux disease without esophagitis   Anemia of chronic disease   Mild intermittent asthma without complication   Sickle cell anemia with pain (HCC)   GAD (generalized anxiety disorder)   History of asthma   History of sickle cell disease  Hb Sickle Cell Disease with Pain crisis: Transfer patient to MedSurg floor. Discontinue Ringers Lactate and replace with IVF 0.45% Saline @ 125 mls/hour, adjust weight based Dilaudid PCA to 0.5/10/3 setting, order IV Toradol 15 mg Q 6 H for a total of 5 days, continue oral home pain medications as ordered. Monitor vitals very closely, Re-evaluate pain scale regularly, 2 L of Oxygen by [place]. Patient encouraged to ambulate on the hallway today. Incentive Spirometry. Leukocytosis: Mild. No evidence of infection or inflammation. Will monitor closely without antibiotics.  Anemia of Chronic Disease: Hgb is stable at baseline today, there's no clinical indication for blood transfusion at this time. Will monitor closely and transfuse as appropriate, when Hgb <6. Recheck labs in am. Chronic pain Syndrome: Restart home medication. Generalized Anxiety Disorder: Stable clinically. Continue home medications. History of PE: Continue Xarelto  Code Status: Full Code Family Communication: N/A Disposition Plan: Not yet ready for discharge  [place]  If 7PM-7AM, please contact night-coverage.  08/31/2023, 4:33 PM  LOS: 0 days

## 2023-08-31 NOTE — H&P (Signed)
 History and Physical    [facility] VZD:[id number] DOB: 04-Mar-1981 DOA: 08/31/2023  PCP: [name], NP   Patient coming from: Home   Chief Complaint:  Chief Complaint  Patient presents with   Sickle Cell Pain Crisis   ED TRIAGE note:  HPI:  Molly Gutierrez is a 43 y.o. female with medical history significant of sickle cell disease, pulmonary embolism on Xarelto, reactive airway disease, generalized anxiety disorder and vitamin D deficiency presented emergency department complaining about chest pain.  Patient reported her chest pain history of active sickle cell pain crisis.  Reported pain started around Monday.  Patient is reporting she has been running low with her Dilaudid and pain got out of control.  Denies any fever, chill, cough, abdominal pain, shoulder pain, back pain, and upper/lower extremity pain.   ED Course:  At presentation to emergency department patient is hemodynamically stable. Troponin within normal range without any delta change. Negative pregnancy test. BMP unremarkable. CBC showing persistent leukocytosis, stable H&H 7.9 and 23 and slightly elevated platelet count 285.  Elevated reticulocyte count. Chest x-ray no acute cardiopulmonary disease process.  In the ED patient has been received Dilaudid 2 mg x 3 doses, Benadryl and Zofran.  Hospitalist has been consulted for further evaluation and management of sickle cell pain crisis.  Significant labs in the ED: Lab Orders         CBC WITH DIFFERENTIAL         Reticulocytes         Basic metabolic panel         hCG, serum, qualitative         Comprehensive metabolic panel         CBC with Differential/Platelet       Review of Systems:  Review of Systems  Constitutional:  Negative for chills, diaphoresis, fever, malaise/fatigue and weight loss.  Respiratory:  Negative for cough, sputum production, shortness of breath and wheezing.   Cardiovascular:  Positive for chest pain. Negative  for palpitations, orthopnea, claudication, leg swelling and PND.       Generalized chest pain  Gastrointestinal:  Negative for heartburn, nausea and vomiting.  Musculoskeletal:  Negative for back pain, falls, joint pain, myalgias and neck pain.  Neurological:  Negative for dizziness and headaches.  Endo/Heme/Allergies:  Does not bruise/bleed easily.  Psychiatric/Behavioral:  The patient is not nervous/anxious.   All other systems reviewed and are negative.   Past Medical History:  Diagnosis Date   Asthma    Eczema    History of pulmonary embolus (PE)    Sickle cell anemia (HCC)     Past Surgical History:  Procedure Laterality Date   CHOLECYSTECTOMY     ERCP     JOINT REPLACEMENT     PORTA CATH INSERTION     TUBAL LIGATION     WISDOM TOOTH EXTRACTION       reports that she has never smoked. She has never used smokeless tobacco. She reports that she does not drink alcohol and does not use drugs.  Allergies  Allergen Reactions   Cefaclor Hives, Swelling and Rash   Hydroxyurea Palpitations and Other (See Comments)    Lowers "blood levels" and heart rate (causes HYPOtension); "it messes me up, it drops my levels and stuff" and Hypotension    Omeprazole Other (See Comments)    Causes "sharp pains in the stomach"   Ketamine Palpitations and Other (See Comments)    "Pt states she has had previous reaction to  ketamine. States she becomes flushed, heart races, dizzy, and feels like she is going to pass out."    Family History  Problem Relation Age of Onset   Renal Disease Mother    Hypertension Mother    High Cholesterol Mother    Heart attack Mother    Diabetes Brother     Prior to Admission medications   Medication Sig Start Date End Date Taking? Authorizing Provider  acetaminophen (TYLENOL) 500 MG tablet Take 500 mg by mouth every 6 (six) hours as needed for mild pain or headache (or cramps).   Yes [provider]  albuterol (VENTOLIN HFA) 108 (90 Base)  MCG/ACT inhaler TAKE 2 PUFFS BY MOUTH EVERY 6 HOURS AS NEEDED FOR WHEEZE OR SHORTNESS OF BREATH Patient taking differently: Inhale 2 puffs into the lungs every 6 (six) hours as needed for wheezing or shortness of breath. 01/22/23  Yes [name], NP  budesonide-formoterol (SYMBICORT) 80-4.5 MCG/ACT inhaler INHALE 2 PUFFS INTO THE LUNGS TWICE A DAY Patient taking differently: Inhale 2 puffs into the lungs 2 (two) times daily as needed. 01/27/23  Yes [name], NP  Cholecalciferol (VITAMIN D3 PO) Take 1 capsule by mouth at bedtime.   Yes [provider]  Cyanocobalamin (VITAMIN B-12 PO) Take 1 tablet by mouth at bedtime.   Yes [provider]  Deferiprone (FERRIPROX) 500 MG TABS Take 500 mg by mouth 2 (two) times daily. Patient taking differently: Take 1,500 mg by mouth daily. 05/14/23  Yes Massie Maroon, FNP  diphenhydrAMINE (BENADRYL) 25 MG tablet Take 25 mg by mouth See admin instructions. Take 25 mg by mouth every four to six hours with each dose of Hydromorphone   Yes [provider]  folic acid (FOLVITE) 1 MG tablet Take 1 tablet (1 mg total) by mouth daily. 12/23/22  Yes [name], NP  HYDROmorphone (DILAUDID) 4 MG tablet Take 1 tablet (4 mg total) by mouth every 6 (six) hours as needed for up to 15 days for severe pain (pain score 7-10). 08/18/23 09/04/23 Yes [name], NP  mirtazapine (REMERON) 45 MG tablet Take 1 tablet (45 mg total) by mouth at bedtime. 12/23/22 12/23/23 Yes [name], NP  naloxone [facility]) nasal spray 4 mg/0.1 mL Place 0.1 sprays (0.4 mg total) into the nose once as needed (opioid overdose). 12/23/22  Yes [name], NP  polyethylene glycol (MIRALAX / GLYCOLAX) 17 g packet Take 17 g by mouth daily as needed for mild constipation (MIX AS DIRECTED AND DRINK). 12/23/22  Yes [name], NP  promethazine (PHENERGAN) 25 MG tablet Take 0.5-1 tablets (12.5-25 mg total) by mouth every 6 (six) hours as needed for nausea or  vomiting. 08/06/23 08/05/24 Yes [name], NP  rivaroxaban (XARELTO) 20 MG TABS tablet Take 20 mg by mouth every evening.   Yes [provider]  Vitamin D, Ergocalciferol, (DRISDOL) 1.25 MG (50000 UNIT) CAPS capsule Take 1 capsule (50,000 Units total) by mouth once a week. Patient taking differently: Take 50,000 Units by mouth every Wednesday. 12/23/22 12/23/23 Yes [name], NP  fluticasone (FLONASE) 50 MCG/ACT nasal spray Place 1 spray into both nostrils daily as needed for allergies or rhinitis. 12/31/21 05/05/23  [name], NP  XARELTO 20 MG TABS tablet Take 1 tablet (20 mg total) by mouth daily with supper. Patient taking differently: Take 20 mg by mouth at bedtime. 10/29/21 06/02/23  [name], NP     Physical Exam:  Vitals:   08/31/23 0039 08/31/23 0154 08/31/23 0230 08/31/23 0300  BP: 102/70  (!) 127/93 98/72  Pulse: 85  82 86  Resp: 18     Temp: 98.5 F (36.9 C)     TempSrc: Oral     SpO2: 97%  94% 90%  Weight:  56.4 kg    Height:  5\' 3"  (1.6 m)      Physical Exam Vitals and nursing note reviewed.  Constitutional:      Appearance: Normal appearance.  HENT:     Nose: Nose normal.     Mouth/Throat:     Mouth: Mucous membranes are dry.  Eyes:     Pupils: Pupils are equal, round, and reactive to light.  Cardiovascular:     Rate and Rhythm: Normal rate and regular rhythm.     Pulses: Normal pulses.     Heart sounds: Normal heart sounds.  Pulmonary:     Effort: Pulmonary effort is normal.     Breath sounds: Normal breath sounds.  Abdominal:     Palpations: Abdomen is soft.  Musculoskeletal:        General: No swelling.     Cervical back: Neck supple.     Right lower leg: No edema.     Left lower leg: No edema.  Skin:    Capillary Refill: Capillary refill takes less than 2 seconds.     Coloration: Skin is pale.     Findings: No erythema or rash.  Neurological:     Mental Status: She is alert and oriented to person, place, and time.   Psychiatric:        Mood and Affect: Mood normal.        Thought Content: Thought content normal.      Labs on Admission: I have personally reviewed following labs and imaging studies  CBC: Recent Labs  Lab 08/31/23 0100  WBC 13.6*  NEUTROABS 8.2*  HGB 7.9*  HCT 23.2*  MCV 92.4  PLT 485*   Basic Metabolic Panel: Recent Labs  Lab 08/31/23 0100  NA 136  K 3.8  CL 106  CO2 23  GLUCOSE 100*  BUN 7  CREATININE 0.46  CALCIUM 8.7*   GFR: Estimated Creatinine Clearance: 75.8 mL/min (by C-G formula based on SCr of 0.46 mg/dL). Liver Function Tests: No results for input(s): "AST", "ALT", "ALKPHOS", "BILITOT", "PROT", "ALBUMIN" in the last 168 hours. No results for input(s): "LIPASE", "AMYLASE" in the last 168 hours. No results for input(s): "AMMONIA" in the last 168 hours. Coagulation Profile: No results for input(s): "INR", "PROTIME" in the last 168 hours. Cardiac Enzymes: Recent Labs  Lab 08/31/23 0100 08/31/23 0334  TROPONINIHS 3 2   BNP (last 3 results) No results for input(s): "BNP" in the last 8760 hours. HbA1C: No results for input(s): "HGBA1C" in the last 72 hours. CBG: No results for input(s): "GLUCAP" in the last 168 hours. Lipid Profile: No results for input(s): "CHOL", "HDL", "LDLCALC", "TRIG", "CHOLHDL", "LDLDIRECT" in the last 72 hours. Thyroid Function Tests: No results for input(s): "TSH", "T4TOTAL", "FREET4", "T3FREE", "THYROIDAB" in the last 72 hours. Anemia Panel: Recent Labs    08/31/23 0100  RETICCTPCT 16.3*   Urine analysis:    Component Value Date/Time   COLORURINE YELLOW 05/06/2023 0026   APPEARANCEUR CLEAR 05/06/2023 0026   LABSPEC 1.014 05/06/2023 0026   PHURINE 5.0 05/06/2023 0026   GLUCOSEU NEGATIVE 05/06/2023 0026   HGBUR NEGATIVE 05/06/2023 0026   BILIRUBINUR NEGATIVE 05/06/2023 0026   BILIRUBINUR neg  08/03/2020 1429   KETONESUR NEGATIVE 05/06/2023 0026   PROTEINUR NEGATIVE 05/06/2023 0026   UROBILINOGEN 1.0 08/03/2020  1429   NITRITE NEGATIVE 05/06/2023 0026   LEUKOCYTESUR SMALL (A) 05/06/2023 0026    Radiological Exams on Admission: I have personally reviewed images DG Chest 2 View Result Date: 08/31/2023 CLINICAL DATA:  Sickle cell, chest pain EXAM: CHEST - 2 VIEW COMPARISON:  Radiograph 04/22/2023 FINDINGS: Stable cardiomediastinal silhouette. Unchanged bilateral chest wall port. No focal consolidation, pleural effusion, or pneumothorax. No displaced rib fractures. IMPRESSION: No active cardiopulmonary disease. Electronically Signed   By: Minerva Fester M.D.   On: 08/31/2023 01:20     EKG: EKG unremarkable for any ST and T wave change.  Normal sinus rhythm.   Assessment/Plan: Principal Problem:   Sickle cell pain crisis (HCC) Active Problems:   Mild intermittent asthma without complication   History of pulmonary embolism   Gastro-esophageal reflux disease without esophagitis   Anemia of chronic disease   GAD (generalized anxiety disorder)   History of asthma   History of sickle cell disease    Assessment and Plan: Sickle cell pain crisis History of sickle cell disease Anemia of chronic disease Noncardiac chest pain-secondary to check sickle cell pain crisis -Patient presented with complaining of generalized chest pain since Monday.  Patient was admitted to [facility] and discharged on Monday reported that since Monday patient low of the oral Dilaudid and continues to started having generalized chest pain.  Denies any shortness of breath, fever, cough, palpitation, nausea, abdominal pain, shoulder pain, back pain, abdominal pain and extremity pain - Normal troponin x 2.  EKG showing normal sinus rhythm there is no ST anterior change. -Chest x-ray no active disease process - There is no concern for acute coronary syndrome and acute chest syndrome. - CBC showing hemoglobin 7.9, hematocrit 23 2, and normal MCV.  Baseline hemoglobin around 7.4 to 8.2.  Hemoglobin at baseline - In the ED patient  received Dilaudid 2 mg total 3 doses, Zofran and Benadryl. -At home patient takes Dilaudid 4 mg every 6 hours as needed.  Holding oral Dilaudid given the starting of IV PCA pump. - Starting Dilaudid PCA pump. -Continue Toradol 15 mg every 6 hour - Continue Dilaudid 2 mg every 4 hour as needed total 2 doses until PCA pump will be initiated. -Starting maintenance fluid LR 100 cc/h. -Continue check pulse ox, ETCO2 as long Dilaudid PCA pump, -Narcan as needed on board. -Continue oral folic acid and vitamin B12 - Patient has history of known tolerance with hydroxyurea.   History of pulmonary embolism -Stable H&H - Continue Xarelto 20 mg daily  GERD -Continue Pepcid  History of asthma -Continue albuterol as needed, Dulera twice daily.  Generalized anxiety disorder Continue Remeron.   DVT prophylaxis:  Xarelto Code Status:  Full Code Diet: Continue heart healthy diet Family Communication:   Family was present at bedside, at the time of interview. Opportunity was given to ask question and all questions were answered satisfactorily.  Disposition Plan: Culture monitor improvement of sickle cell pain. Consults: Sickle cell team Admission status:   Observation, Step Down Unit  Severity of Illness: The appropriate patient status for this patient is OBSERVATION. Observation status is judged to be reasonable and necessary in order to provide the required intensity of service to ensure the patient's safety. The patient's presenting symptoms, physical exam findings, and initial radiographic and laboratory data in the context of their medical condition is felt to place them at decreased risk for  further clinical deterioration. Furthermore, it is anticipated that the patient will be medically stable for discharge from the hospital within 2 midnights of admission.     Tereasa Coop, MD [facility]  How to contact the [facility] Attending or Consulting provider 7A - 7P or covering provider  during after hours 7P -7A, for this patient.  Check the care team in [facility] and look for a) attending/consulting [facility] provider listed and b) the [facility] team listed Log into [email or web address] and use [place]'s universal password to access. If you do not have the password, please contact the hospital operator. Locate the [facility] provider you are looking for under [facility] and page to a number that you can be directly reached. If you still have difficulty reaching the provider, please page the [facility] (Director on Call) for the Hospitalists listed on amion for assistance.  08/31/2023, 5:15 AM

## 2023-09-01 LAB — CBC
HCT: 18.5 % — ABNORMAL LOW (ref 36.0–46.0)
Hemoglobin: 6.6 g/dL — CL (ref 12.0–15.0)
MCH: 31.6 pg (ref 26.0–34.0)
MCHC: 35.7 g/dL (ref 30.0–36.0)
MCV: 88.5 fL (ref 80.0–100.0)
Platelets: 408 10*3/uL — ABNORMAL HIGH (ref 150–400)
RBC: 2.09 MIL/uL — ABNORMAL LOW (ref 3.87–5.11)
RDW: 21.7 % — ABNORMAL HIGH (ref 11.5–15.5)
WBC: 13.8 10*3/uL — ABNORMAL HIGH (ref 4.0–10.5)
nRBC: 0.5 % — ABNORMAL HIGH (ref 0.0–0.2)

## 2023-09-01 LAB — BASIC METABOLIC PANEL
Anion gap: 4 — ABNORMAL LOW (ref 5–15)
BUN: 9 mg/dL (ref 6–20)
CO2: 24 mmol/L (ref 22–32)
Calcium: 8.4 mg/dL — ABNORMAL LOW (ref 8.9–10.3)
Chloride: 108 mmol/L (ref 98–111)
Creatinine, Ser: 0.55 mg/dL (ref 0.44–1.00)
GFR, Estimated: >60 mL/min (ref >60)
Glucose, Bld: 99 mg/dL (ref 70–99)
Potassium: 3.8 mmol/L (ref 3.5–5.1)
Sodium: 136 mmol/L (ref 135–145)

## 2023-09-01 LAB — PREPARE RBC (CROSSMATCH)

## 2023-09-01 MED ORDER — FAMOTIDINE 20 MG PO TABS
20.0000 mg | ORAL_TABLET | Freq: Every day | ORAL | Status: DC
  Administered 2023-09-01 – 2023-09-04 (×4): 20 mg via ORAL
  Filled 2023-09-01 (×3): qty 1

## 2023-09-01 MED ORDER — ACETAMINOPHEN 325 MG PO TABS
650.0000 mg | ORAL_TABLET | Freq: Once | ORAL | Status: AC
  Administered 2023-09-01: 650 mg via ORAL

## 2023-09-01 MED ORDER — SODIUM CHLORIDE 0.9% IV SOLUTION: Freq: Once | INTRAVENOUS | Status: AC

## 2023-09-01 MED ORDER — DIPHENHYDRAMINE HCL 25 MG PO CAPS
25.0000 mg | ORAL_CAPSULE | Freq: Once | ORAL | Status: AC
  Administered 2023-09-01: 25 mg via ORAL
  Filled 2023-09-01: qty 1

## 2023-09-01 MED ORDER — ACETAMINOPHEN 325 MG PO TABS: 650.0000 mg | ORAL_TABLET | Freq: Once | ORAL | Status: DC

## 2023-09-01 MED ORDER — DIPHENHYDRAMINE HCL 50 MG/ML IJ SOLN
12.5000 mg | Freq: Once | INTRAMUSCULAR | Status: AC
  Administered 2023-09-01: 12.5 mg via INTRAVENOUS
  Filled 2023-09-01: qty 1

## 2023-09-01 NOTE — Progress Notes (Signed)
 PHARMACIST - PHYSICIAN COMMUNICATION  DR:   Jeanann Lewandowsky  CONCERNING: IV to Oral Route Change Policy  RECOMMENDATION: This patient is receiving Pepcid by the intravenous route.  Based on criteria approved by the Pharmacy and Therapeutics Committee, the intravenous medication(s) is/are being converted to the equivalent oral dose form(s).   DESCRIPTION: These criteria include: The patient is eating (either orally or via tube) and/or has been taking other orally administered medications for a least 24 hours The patient has no evidence of active gastrointestinal bleeding or impaired GI absorption (gastrectomy, short bowel, patient on TNA or NPO).  If you have questions about this conversion, please contact the Pharmacy Department  []   [phone] )  [facility] []   [phone] )  [facility] []   [phone] )  [facility] []   [phone] )  [facility] [x]   [phone] )  [facility]   Thank you for allowing pharmacy to be a part of this patient's care.   Lyn Henri, [facility] PharmD Candidate 09/01/2023 9:18 AM

## 2023-09-01 NOTE — Plan of Care (Signed)

## 2023-09-01 NOTE — Progress Notes (Signed)
 Patient ID: Molly Gutierrez, female   DOB: 1980/11/15, 43 y.o.   MRN: [id number] Subjective: Molly Gutierrez is a 43 year old female with a medical history significant for sickle cell disease, chronic pain syndrome, anemia of chronic disease opiate dependence and tolerance, mild intermittent asthma, and history of PE on Xarelto who was admitted yesterday for sickle cell pain crisis.   She continues to endorse significant however improving pain in her chest, lower back and lower extremities. Pain is at 7/10 today,  constant and throbbing. She denies any fever, cough, SOB. She has nausea but no vomiting. No urinary symptom.   Objective:  Vital signs in last 24 hours:  Vitals:   09/01/23 0524 09/01/23 0625 09/01/23 0736 09/01/23 0935  BP:  107/74  95/71  Pulse:  72  74  Resp: 14 14 14 20   Temp:  97.6 F (36.4 C)  98.8 F (37.1 C)  TempSrc:  Oral  Oral  SpO2: 96% 99%  98%  Weight:      Height:        Intake/Output from previous day:   Intake/Output Summary (Last 24 hours) at 09/01/2023 1230 Last data filed at 09/01/2023 0212 Gross per 24 hour  Intake 2566.94 ml  Output --  Net 2566.94 ml    Physical Exam: General: Alert, awake, oriented x3, in no acute distress.  HEENT: [place]/AT PEERL, EOMI Neck: Trachea midline,  no masses, no thyromegal,y no JVD, no carotid bruit OROPHARYNX:  Moist, No exudate/ erythema/lesions.  Heart: Regular rate and rhythm, without murmurs, rubs, gallops, PMI non-displaced, no heaves or thrills on palpation.  Lungs: Clear to auscultation, no wheezing or rhonchi noted. No increased vocal fremitus resonant to percussion  Abdomen: Soft, nontender, nondistended, positive bowel sounds, no masses no hepatosplenomegaly noted..  Neuro: No focal neurological deficits noted cranial nerves II through XII grossly intact. DTRs 2+ bilaterally upper and lower extremities. Strength 5 out of 5 in bilateral upper and lower extremities. Musculoskeletal: No warm swelling  or erythema around joints, chest wall pain,  Psychiatric: Patient alert and oriented x3, good insight and cognition, good recent to remote recall. Lymph node survey: No cervical axillary or inguinal lymphadenopathy noted.  Lab Results:  Basic Metabolic Panel:    Component Value Date/Time   NA 136 09/01/2023 0525   NA 137 12/23/2022 1210   K 3.8 09/01/2023 0525   CL 108 09/01/2023 0525   CO2 24 09/01/2023 0525   BUN 9 09/01/2023 0525   BUN 5 (L) 12/23/2022 1210   CREATININE 0.55 09/01/2023 0525   GLUCOSE 99 09/01/2023 0525   CALCIUM 8.4 (L) 09/01/2023 0525   CBC:    Component Value Date/Time   WBC 13.8 (H) 09/01/2023 0525   HGB 6.6 (LL) 09/01/2023 0525   HGB 7.9 (L) 12/23/2022 1210   HCT 18.5 (L) 09/01/2023 0525   HCT 24.4 (L) 12/23/2022 1210   PLT 408 (H) 09/01/2023 0525   PLT 554 (H) 12/23/2022 1210   MCV 88.5 09/01/2023 0525   MCV 94 12/23/2022 1210   NEUTROABS 7.8 (H) 08/31/2023 0544   NEUTROABS 10.4 (H) 12/23/2022 1210   LYMPHSABS 3.6 08/31/2023 0544   LYMPHSABS 2.9 12/23/2022 1210   MONOABS 1.8 (H) 08/31/2023 0544   EOSABS 0.6 (H) 08/31/2023 0544   EOSABS 0.2 12/23/2022 1210   BASOSABS 0.1 08/31/2023 0544   BASOSABS 0.1 12/23/2022 1210    No results found for this or any previous visit (from the past 240 hours).  Studies/Results: DG Chest 2 View Result Date:  08/31/2023 CLINICAL DATA:  Sickle cell, chest pain EXAM: CHEST - 2 VIEW COMPARISON:  Radiograph 04/22/2023 FINDINGS: Stable cardiomediastinal silhouette. Unchanged bilateral chest wall port. No focal consolidation, pleural effusion, or pneumothorax. No displaced rib fractures. IMPRESSION: No active cardiopulmonary disease. Electronically Signed   By: Minerva Fester M.D.   On: 08/31/2023 01:20    Medications: Scheduled Meds:  Chlorhexidine Gluconate Cloth  6 each Topical Daily   cholecalciferol  1,000 Units Oral Daily   famotidine  20 mg Oral Daily   folic acid  1 mg Oral Daily   HYDROmorphone    Intravenous Q4H   ketorolac  15 mg Intravenous Q6H   mirtazapine  45 mg Oral QHS   mometasone-formoterol  2 puff Inhalation BID   rivaroxaban  20 mg Oral QPM   senna-docusate  1 tablet Oral BID   vitamin B-12  100 mcg Oral Daily   Continuous Infusions:  sodium chloride 125 mL/hr at 09/01/23 0535   PRN Meds:.albuterol, diphenhydrAMINE, naloxone **AND** sodium chloride flush, ondansetron, polyethylene glycol, sodium chloride flush  Consultants: None  Procedures: Blood transfusion  Antibiotics: None  Assessment/Plan: Principal Problem:   Sickle cell pain crisis (HCC) Active Problems:   History of pulmonary embolism   Gastro-esophageal reflux disease without esophagitis   Anemia of chronic disease   Mild intermittent asthma without complication   Sickle cell anemia with pain (HCC)   GAD (generalized anxiety disorder)   History of asthma   History of sickle cell disease   Hb Sickle Cell Disease with Pain crisis: Continue IVF 0.45% Saline KVO, continue weight based Dilaudid PCA, IV Toradol 15 mg Q 6 H for a total of 5 days, continue oral home pain medications as ordered. Monitor vitals very closely, Re-evaluate pain scale regularly, 2 L of Oxygen by [place]. Patient encouraged to ambulate on the hallway today.  Leukocytosis: Mild. No evidence of infection or inflammation. Will monitor closely without antibiotics.  Anemia of Chronic Disease: HGB is 6.6 will transfuse 1 unit PRBC. Repeat H&H in the AM Chronic pain Syndrome: continue oral pain medication History of PE: Continue Xarelto  Code Status: Full Code Family Communication: N/A Disposition Plan: Not yet ready for discharge  Molly Gutierrez  If 7PM-7AM, please contact night-coverage.  09/01/2023, 12:30 PM  LOS: 1 day

## 2023-09-02 ENCOUNTER — Other Ambulatory Visit: Payer: Self-pay

## 2023-09-02 LAB — TYPE AND SCREEN
ABO/RH(D): A POS
Antibody Screen: NEGATIVE
Unit division: 0

## 2023-09-02 LAB — CBC
HCT: 21.9 % — ABNORMAL LOW (ref 36.0–46.0)
Hemoglobin: 7.7 g/dL — ABNORMAL LOW (ref 12.0–15.0)
MCH: 31 pg (ref 26.0–34.0)
MCHC: 35.2 g/dL (ref 30.0–36.0)
MCV: 88.3 fL (ref 80.0–100.0)
Platelets: 403 10*3/uL — ABNORMAL HIGH (ref 150–400)
RBC: 2.48 MIL/uL — ABNORMAL LOW (ref 3.87–5.11)
RDW: 19.8 % — ABNORMAL HIGH (ref 11.5–15.5)
WBC: 15.9 10*3/uL — ABNORMAL HIGH (ref 4.0–10.5)
nRBC: 0.4 % — ABNORMAL HIGH (ref 0.0–0.2)

## 2023-09-02 LAB — BPAM RBC
Blood Product Expiration Date: 202504152359
ISSUE DATE / TIME: 202503171505
Unit Type and Rh: 9500

## 2023-09-02 MED ORDER — HYDROMORPHONE 1 MG/ML IV SOLN
INTRAVENOUS | Status: AC
  Administered 2023-09-02: 4 mg via INTRAVENOUS
  Administered 2023-09-02: 1 mg via INTRAVENOUS
  Administered 2023-09-02: 3 mg via INTRAVENOUS
  Administered 2023-09-03: 5.5 mg via INTRAVENOUS
  Administered 2023-09-03: 2.5 mg via INTRAVENOUS
  Administered 2023-09-03: 2 mg via INTRAVENOUS
  Administered 2023-09-03: 7.5 mg via INTRAVENOUS
  Administered 2023-09-03: 30 mg via INTRAVENOUS
  Filled 2023-09-02: qty 30

## 2023-09-02 NOTE — Progress Notes (Signed)
 Patient ID: Molly Gutierrez, female   DOB: [birth year], 43 y.o.   MRN: [id number] Subjective: Molly Gutierrez is a 43 year old female with a medical history significant for sickle cell disease, chronic pain syndrome, anemia of chronic disease opiate dependence and tolerance, mild intermittent asthma, and history of PE on Xarelto who was admitted this morning for sickle cell pain crisis.    Patient is reports improved pain. In her chest, lower back and lower extremities. Pain is 6-7/10, constant and throbbing.  She denies any fever, cough, SOB. She has nausea but no vomiting. No urinary symptom.     Objective:  Vital signs in last 24 hours:  Vitals:   09/02/23 0808 09/02/23 0957 09/02/23 1226 09/02/23 1250  BP:  94/64  (!) 116/93  Pulse:  72  88  Resp: 16 16 13 16   Temp:  98.3 F (36.8 C)  99 F (37.2 C)  TempSrc:  Oral  Oral  SpO2: 97% 96%  98%  Weight:      Height:        Intake/Output from previous day:   Intake/Output Summary (Last 24 hours) at 09/02/2023 1339 Last data filed at 09/02/2023 1000 Gross per 24 hour  Intake 2019.93 ml  Output --  Net 2019.93 ml    Physical Exam: General: Alert, awake, oriented x3, in no acute distress.  HEENT: [place]/AT PEERL, EOMI Neck: Trachea midline,  no masses, no thyromegal,y no JVD, no carotid bruit OROPHARYNX:  Moist, No exudate/ erythema/lesions.  Heart: Regular rate and rhythm, without murmurs, rubs, gallops, PMI non-displaced, no heaves or thrills on palpation.  Lungs: Clear to auscultation, no wheezing or rhonchi noted. No increased vocal fremitus resonant to percussion  Abdomen: Soft, nontender, nondistended, positive bowel sounds, no masses no hepatosplenomegaly noted..  Neuro: No focal neurological deficits noted cranial nerves II through XII grossly intact. DTRs 2+ bilaterally upper and lower extremities. Strength 5 out of 5 in bilateral upper and lower extremities. Musculoskeletal: No warm swelling or erythema around  joint. Positive for arthralgia . Psychiatric: Patient alert and oriented x3, good insight and cognition, good recent to remote recall. Lymph node survey: No cervical axillary or inguinal lymphadenopathy noted.  Lab Results:  Basic Metabolic Panel:    Component Value Date/Time   NA 136 09/01/2023 0525   NA 137 12/23/2022 1210   K 3.8 09/01/2023 0525   CL 108 09/01/2023 0525   CO2 24 09/01/2023 0525   BUN 9 09/01/2023 0525   BUN 5 (L) 12/23/2022 1210   CREATININE 0.55 09/01/2023 0525   GLUCOSE 99 09/01/2023 0525   CALCIUM 8.4 (L) 09/01/2023 0525   CBC:    Component Value Date/Time   WBC 15.9 (H) 09/02/2023 0515   HGB 7.7 (L) 09/02/2023 0515   HGB 7.9 (L) 12/23/2022 1210   HCT 21.9 (L) 09/02/2023 0515   HCT 24.4 (L) 12/23/2022 1210   PLT 403 (H) 09/02/2023 0515   PLT 554 (H) 12/23/2022 1210   MCV 88.3 09/02/2023 0515   MCV 94 12/23/2022 1210   NEUTROABS 7.8 (H) 08/31/2023 0544   NEUTROABS 10.4 (H) 12/23/2022 1210   LYMPHSABS 3.6 08/31/2023 0544   LYMPHSABS 2.9 12/23/2022 1210   MONOABS 1.8 (H) 08/31/2023 0544   EOSABS 0.6 (H) 08/31/2023 0544   EOSABS 0.2 12/23/2022 1210   BASOSABS 0.1 08/31/2023 0544   BASOSABS 0.1 12/23/2022 1210    No results found for this or any previous visit (from the past 240 hours).  Studies/Results: No results found.  Medications: Scheduled  Meds:  Chlorhexidine Gluconate Cloth  6 each Topical Daily   cholecalciferol  1,000 Units Oral Daily   famotidine  20 mg Oral Daily   folic acid  1 mg Oral Daily   HYDROmorphone   Intravenous Q4H   mirtazapine  45 mg Oral QHS   mometasone-formoterol  2 puff Inhalation BID   rivaroxaban  20 mg Oral QPM   senna-docusate  1 tablet Oral BID   vitamin B-12  100 mcg Oral Daily   Continuous Infusions: PRN Meds:.albuterol, diphenhydrAMINE, naloxone **AND** sodium chloride flush, ondansetron, polyethylene glycol, sodium chloride flush  Consultants: None  Procedures: Blood transfusion  (completed)  Antibiotics: None  Assessment/Plan: Principal Problem:   Sickle cell pain crisis (HCC) Active Problems:   History of pulmonary embolism   Gastro-esophageal reflux disease without esophagitis   Anemia of chronic disease   Mild intermittent asthma without complication   Sickle cell anemia with pain (HCC)   GAD (generalized anxiety disorder)   History of asthma   History of sickle cell disease   Hb Sickle Cell Disease with Pain crisis: Continue IVF Saline KVO continue weight based Dilaudid PCA, IV Toradol 15 mg Q 6 H for a total of 5 days, continue oral home pain medications as ordered. Monitor vitals very closely, Re-evaluate pain scale regularly, 2 L of Oxygen by [place]. Patient encouraged to ambulate on the hallway today.  Leukocytosis: Leukocytosis: Mild. No evidence of infection or inflammation. Will monitor closely without antibiotics.  Anemia of Chronic Disease: Anemia of Chronic Disease: Hgb is  lower than patients base line at 6.6. Transfuse 1 unit PRBC. HGB is  is 7.7  Chronic pain Syndrome: Restart home medication.  History of PE: Continue Xarelto  Code Status: Full Code Family Communication: N/A Disposition Plan: Not yet ready for discharge  Daryll Drown NP  If 7PM-7AM, please contact night-coverage.  09/02/2023, 1:39 PM  LOS: 2 days

## 2023-09-02 NOTE — Plan of Care (Signed)

## 2023-09-03 MED ORDER — SODIUM CHLORIDE 0.9% FLUSH: 9.0000 mL | INTRAVENOUS | Status: DC | PRN

## 2023-09-03 MED ORDER — HYDROMORPHONE 1 MG/ML IV SOLN: INTRAVENOUS | Status: DC

## 2023-09-03 MED ORDER — HYDROMORPHONE 1 MG/ML IV SOLN
INTRAVENOUS | Status: DC
  Administered 2023-09-04: 30 mg via INTRAVENOUS
  Administered 2023-09-04: 4 mg via INTRAVENOUS
  Administered 2023-09-04: 11 mg via INTRAVENOUS
  Administered 2023-09-04: 2.5 mg via INTRAVENOUS
  Filled 2023-09-03: qty 30

## 2023-09-03 MED ORDER — NALOXONE HCL 0.4 MG/ML IJ SOLN: 0.4000 mg | INTRAMUSCULAR | Status: DC | PRN

## 2023-09-03 MED ORDER — HYDROXYZINE HCL 10 MG PO TABS
10.0000 mg | ORAL_TABLET | Freq: Three times a day (TID) | ORAL | Status: DC | PRN
Start: 2023-09-03 — End: 2023-09-04
  Administered 2023-09-03: 10 mg via ORAL
  Filled 2023-09-03 (×3): qty 1

## 2023-09-03 MED ORDER — DIPHENHYDRAMINE HCL 50 MG/ML IJ SOLN
25.0000 mg | Freq: Once | INTRAMUSCULAR | Status: AC
Start: 2023-09-03 — End: 2023-09-03
  Administered 2023-09-03: 25 mg via INTRAVENOUS
  Filled 2023-09-03: qty 1

## 2023-09-03 NOTE — Plan of Care (Signed)

## 2023-09-03 NOTE — Progress Notes (Signed)
 Patient ID: Molly Gutierrez, female   DOB: [birth year], 43 y.o.   MRN: [id number] Subjective: Molly Gutierrez is a 43 year old female with a medical history significant for sickle cell disease, chronic pain syndrome, anemia of chronic disease opiate dependence and tolerance, mild intermittent asthma, and history of PE on Xarelto who was admitted this morning for sickle cell pain crisis.    Patient has no new concerns today.  Pain in  her chest, lower back and lower extremities. She rates pain 7/10, constant and throbbing.  She denies any fever, cough, SOB, nausea has resolved,  no vomiting, no urinary symptom. Educated patient on the need to ambulate. Discharge planning discussed with patient for tomorrow.      Objective:  Vital signs in last 24 hours:  Vitals:   09/03/23 0214 09/03/23 0308 09/03/23 0742 09/03/23 0953  BP:  110/71  111/76  Pulse:  (!) 59  80  Resp: 18 18 18 20   Temp:  99.2 F (37.3 C)  99.5 F (37.5 C)  TempSrc:  Oral  Oral  SpO2: 97% 96%  100%  Weight:      Height:        Intake/Output from previous day:   Intake/Output Summary (Last 24 hours) at 09/03/2023 1024 Last data filed at 09/03/2023 0955 Gross per 24 hour  Intake 240 ml  Output --  Net 240 ml    Physical Exam: General: Alert, awake, oriented x3, in no acute distress.  HEENT: [place]/AT PEERL, EOMI Neck: Trachea midline,  no masses, no thyromegal,y no JVD, no carotid bruit OROPHARYNX:  Moist, No exudate/ erythema/lesions.  Heart: Regular rate and rhythm, without murmurs, rubs, gallops, PMI non-displaced, no heaves or thrills on palpation.  Lungs: Clear to auscultation, no wheezing or rhonchi noted. No increased vocal fremitus resonant to percussion  Abdomen: Soft, nontender, nondistended, positive bowel sounds, no masses no hepatosplenomegaly noted..  Neuro: No focal neurological deficits noted cranial nerves II through XII grossly intact. DTRs 2+ bilaterally upper and lower extremities. Strength  5 out of 5 in bilateral upper and lower extremities. Musculoskeletal: No warm swelling or erythema around joints, chest pain, lower back and bilateral lower extremity pain Psychiatric: Patient alert and oriented x3, good insight and cognition, good recent to remote recall. Lymph node survey: No cervical axillary or inguinal lymphadenopathy noted.  Lab Results:  Basic Metabolic Panel:    Component Value Date/Time   NA 136 09/01/2023 0525   NA 137 12/23/2022 1210   K 3.8 09/01/2023 0525   CL 108 09/01/2023 0525   CO2 24 09/01/2023 0525   BUN 9 09/01/2023 0525   BUN 5 (L) 12/23/2022 1210   CREATININE 0.55 09/01/2023 0525   GLUCOSE 99 09/01/2023 0525   CALCIUM 8.4 (L) 09/01/2023 0525   CBC:    Component Value Date/Time   WBC 15.9 (H) 09/02/2023 0515   HGB 7.7 (L) 09/02/2023 0515   HGB 7.9 (L) 12/23/2022 1210   HCT 21.9 (L) 09/02/2023 0515   HCT 24.4 (L) 12/23/2022 1210   PLT 403 (H) 09/02/2023 0515   PLT 554 (H) 12/23/2022 1210   MCV 88.3 09/02/2023 0515   MCV 94 12/23/2022 1210   NEUTROABS 7.8 (H) 08/31/2023 0544   NEUTROABS 10.4 (H) 12/23/2022 1210   LYMPHSABS 3.6 08/31/2023 0544   LYMPHSABS 2.9 12/23/2022 1210   MONOABS 1.8 (H) 08/31/2023 0544   EOSABS 0.6 (H) 08/31/2023 0544   EOSABS 0.2 12/23/2022 1210   BASOSABS 0.1 08/31/2023 0544   BASOSABS 0.1 12/23/2022 1210  No results found for this or any previous visit (from the past 240 hours).  Studies/Results: No results found.  Medications: Scheduled Meds:  Chlorhexidine Gluconate Cloth  6 each Topical Daily   cholecalciferol  1,000 Units Oral Daily   famotidine  20 mg Oral Daily   folic acid  1 mg Oral Daily   HYDROmorphone   Intravenous Q4H   mirtazapine  45 mg Oral QHS   mometasone-formoterol  2 puff Inhalation BID   rivaroxaban  20 mg Oral QPM   senna-docusate  1 tablet Oral BID   vitamin B-12  100 mcg Oral Daily   Continuous Infusions: PRN Meds:.albuterol, diphenhydrAMINE, naloxone **AND** sodium  chloride flush, ondansetron, polyethylene glycol, sodium chloride flush  Consultants: None  Procedures: None  Antibiotics: None  Assessment/Plan: Principal Problem:   Sickle cell pain crisis (HCC) Active Problems:   History of pulmonary embolism   Gastro-esophageal reflux disease without esophagitis   Anemia of chronic disease   Mild intermittent asthma without complication   Sickle cell anemia with pain (HCC)   GAD (generalized anxiety disorder)   History of asthma   History of sickle cell disease   Hb Sickle Cell Disease with Pain crisis: Continue IVF 0.45% Saline @ 125 mls/hour, continue weight based Dilaudid PCA, IV Toradol 15 mg Q 6 H for a total of 5 days, continue oral home pain medications as ordered. Monitor vitals very closely, Re-evaluate pain scale regularly, 2 L of Oxygen by [place]. Patient encouraged to ambulate on the hallway today.  Leukocytosis:  Mild. No evidence of infection or inflammation. Will monitor closely without antibiotics.  Anemia of Chronic Disease: Transfusion completed, HGB 7.7. will continue to monitor  Chronic pain Syndrome: Continue oral home medication   Code Status: Full Code Family Communication: N/A Disposition Plan: Not yet ready for discharge  Daryll Drown NP  If 7PM-7AM, please contact night-coverage.  09/03/2023, 10:24 AM  LOS: 3 days

## 2023-09-03 NOTE — TOC Initial Note (Signed)
 Transition of Care [facility]) - Initial/Assessment Note    Patient Details  Name: Molly Gutierrez MRN: [id number] Date of Birth: [birth year]  Transition of Care [facility]) CM/SW Contact:    Beckie Busing, RN Phone Number:[phone]  09/03/2023, 4:05 PM  Clinical Narrative:                 TOC following patient admitted with high risk for readmission. Patient is from home where she normally functions independently. Currently there are no TOC needs. TOc will follow.   Expected Discharge Plan: Home/Self Care Barriers to Discharge: Continued Medical Work up   Patient Goals and CMS Choice Patient states their goals for this hospitalization and ongoing recovery are:: Wants pain to get better   Choice offered to / list presented to : NA [place] ownership interest in [facility].provided to::  (n/a)    Expected Discharge Plan and Services In-house Referral: NA Discharge Planning Services: CM Consult Post Acute Care Choice: NA Living arrangements for the past 2 months: Apartment                 DME Arranged: N/A DME Agency: NA       HH Arranged: NA (n/a) HH Agency: NA        Prior Living Arrangements/Services Living arrangements for the past 2 months: Apartment Lives with:: Self Patient language and need for interpreter reviewed:: Yes Do you feel safe going back to the place where you live?: Yes      Need for Family Participation in Patient Care: Yes (Comment) Care giver support system in place?: Yes (comment) Current home services:  (n/a) Criminal Activity/Legal Involvement Pertinent to Current Situation/Hospitalization: No - Comment as needed  Activities of Daily Living   ADL Screening (condition at time of admission) Independently performs ADLs?: Yes (appropriate for developmental age) Is the patient deaf or have difficulty hearing?: No Does the patient have difficulty seeing, even when wearing glasses/contacts?: No Does the patient have difficulty  concentrating, remembering, or making decisions?: No  Permission Sought/Granted   Permission granted to share information with : No              Emotional Assessment Appearance:: Appears stated age Attitude/Demeanor/Rapport: Gracious Affect (typically observed): Accepting, Pleasant, Quiet Orientation: : Oriented to Self, Oriented to Place, Oriented to  Time, Oriented to Situation Alcohol / Substance Use: Not Applicable Psych Involvement: No (comment)  Admission diagnosis:  Sickle cell pain crisis (HCC) [D57.00] Sickle cell anemia with pain (HCC) [D57.00] Patient Active Problem List   Diagnosis Date Noted   GAD (generalized anxiety disorder) 08/31/2023   History of asthma 08/31/2023   History of sickle cell disease 08/31/2023   SIRS (systemic inflammatory response syndrome) (HCC) 05/06/2023   Acute sickle cell crisis (HCC) 10/04/2021   Atypical chest pain 06/03/2021   Chronic pain syndrome 11/22/2020   Sickle cell disease with crisis (HCC) 10/05/2020   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   Sickle cell anemia with pain (HCC) 04/24/2020   Mild intermittent asthma without complication 03/31/2020   Sickle cell anemia with crisis (HCC) 01/07/2020   Sickle cell crisis acute chest syndrome (HCC) 07/29/2019   Drug-seeking behavior 07/07/2019   Sinus tachycardia 07/07/2019   Therapeutic opioid-induced constipation (OIC) 07/07/2019   Breast mass in female 06/29/2019   Atelectasis of right lung 06/29/2019   Sickle cell crisis (HCC) 06/27/2019   Sickle cell pain crisis (HCC) 04/19/2019   Pneumonia 03/27/2019   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   Anterior chest wall pain 11/13/2018  Epigastric abdominal pain 11/13/2018   Hematuria 11/13/2018   Hyperbilirubinemia 11/12/2018   Long term current use of anticoagulant therapy 11/12/2018   History of pulmonary embolism 09/26/2018   Opioid dependence (HCC) 09/13/2018   Port-A-Cath in place 09/13/2018   Narcotic abuse, continuous (HCC)  08/28/2018   Moderate persistent asthma 07/03/2018   Anemia of chronic disease 07/03/2018   Personal history of other venous thrombosis and embolism 07/03/2018   History of transfusion 07/03/2018   Gastro-esophageal reflux disease without esophagitis 06/02/2018   Asthma 05/19/2018   Anxiety and depression 10/30/2017   At risk for sepsis 06/13/2017   Mitral regurgitation 02/01/2014   Itching 12/13/2013   Nausea and vomiting 12/13/2013   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   Frequent complaints of pain 11/01/2013   Hypokalemia 09/19/2013   S/P total hip arthroplasty 05/19/2013   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 [place] [address] Phone: [phone] Fax: [phone]  [facility] [address] [place], [place] - [address] [place] [address] Phone: [phone] Fax: [phone]     Social Drivers of Health (SDOH) Social History: SDOH Screenings   Food Insecurity: No Food Insecurity (08/31/2023)  Housing: Low Risk  (08/31/2023)  Transportation Needs: No Transportation Needs (08/31/2023)  Utilities: Not At Risk (08/31/2023)  Alcohol Screen: Low Risk  (03/12/2021)  Depression (PHQ2-9): Low Risk  (10/16/2022)  Financial Resource Strain: Low Risk  (07/18/2022)  Physical Activity: Inactive (07/18/2022)  Social Connections: Unknown (03/03/2023)    Received from [facility]  Stress: No Stress Concern Present (07/18/2022)  Tobacco Use: Low Risk  (08/31/2023)   SDOH Interventions:     Readmission Risk Interventions    09/03/2023    3:55 PM 04/27/2023    3:09 PM 03/05/2023    3:47 PM  Readmission Risk Prevention Plan  Transportation Screening Complete Complete Complete  PCP or Specialist Appt within 3-5 Days  Complete   HRI or Home Care Consult  Complete   Social Work Consult for Recovery Care Planning/Counseling  Complete Complete  Palliative Care Screening  Not Applicable Not Applicable  Medication Review [employer]) Complete Complete Referral to Pharmacy  HRI or Home Care Consult Complete    SW Recovery Care/Counseling Consult Complete    Palliative Care Screening Not Applicable    Skilled Nursing Facility Not Applicable

## 2023-09-04 ENCOUNTER — Other Ambulatory Visit (HOSPITAL_COMMUNITY): Payer: Self-pay

## 2023-09-04 MED ORDER — HEPARIN SOD (PORK) LOCK FLUSH 100 UNIT/ML IV SOLN
500.0000 [IU] | Freq: Once | INTRAVENOUS | Status: AC
  Administered 2023-09-04: 500 [IU] via INTRAVENOUS
  Filled 2023-09-04: qty 5

## 2023-09-04 MED ORDER — HYDROMORPHONE HCL 4 MG PO TABS
4.0000 mg | ORAL_TABLET | Freq: Four times a day (QID) | ORAL | 0 refills | Status: DC | PRN
  Filled 2023-09-04 (×2): qty 60, 15d supply, fill #0

## 2023-09-04 NOTE — Plan of Care (Signed)
  Problem: Education: Goal: Knowledge of vaso-occlusive preventative measures will improve Outcome: Adequate for Discharge Goal: Awareness of infection prevention will improve Outcome: Adequate for Discharge Goal: Awareness of signs and symptoms of anemia will improve Outcome: Adequate for Discharge Goal: Long-term complications will improve Outcome: Adequate for Discharge   Problem: Self-Care: Goal: Ability to incorporate actions that prevent/reduce pain crisis will improve Outcome: Adequate for Discharge   Problem: Bowel/Gastric: Goal: Gut motility will be maintained Outcome: Adequate for Discharge   Problem: Tissue Perfusion: Goal: Complications related to inadequate tissue perfusion will be avoided or minimized Outcome: Adequate for Discharge   Problem: Respiratory: Goal: Pulmonary complications will be avoided or minimized Outcome: Adequate for Discharge Goal: Acute Chest Syndrome will be identified early to prevent complications Outcome: Adequate for Discharge   Problem: Fluid Volume: Goal: Ability to maintain a balanced intake and output will improve Outcome: Adequate for Discharge   Problem: Sensory: Goal: Pain level will decrease with appropriate interventions Outcome: Adequate for Discharge   Problem: Health Behavior: Goal: Postive changes in compliance with treatment and prescription regimens will improve Outcome: Adequate for Discharge   Problem: Education: Goal: Knowledge of General Education information will improve Description: Including pain rating scale, medication(s)/side effects and non-pharmacologic comfort measures Outcome: Adequate for Discharge   Problem: Health Behavior/Discharge Planning: Goal: Ability to manage health-related needs will improve Outcome: Adequate for Discharge   Problem: Clinical Measurements: Goal: Ability to maintain clinical measurements within normal limits will improve Outcome: Adequate for Discharge Goal: Will remain  free from infection Outcome: Adequate for Discharge Goal: Diagnostic test results will improve Outcome: Adequate for Discharge Goal: Respiratory complications will improve Outcome: Adequate for Discharge Goal: Cardiovascular complication will be avoided Outcome: Adequate for Discharge   Problem: Activity: Goal: Risk for activity intolerance will decrease Outcome: Adequate for Discharge   Problem: Nutrition: Goal: Adequate nutrition will be maintained Outcome: Adequate for Discharge   Problem: Coping: Goal: Level of anxiety will decrease Outcome: Adequate for Discharge   Problem: Elimination: Goal: Will not experience complications related to bowel motility Outcome: Adequate for Discharge Goal: Will not experience complications related to urinary retention Outcome: Adequate for Discharge   Problem: Pain Managment: Goal: General experience of comfort will improve and/or be controlled Outcome: Adequate for Discharge   Problem: Safety: Goal: Ability to remain free from injury will improve Outcome: Adequate for Discharge   Problem: Skin Integrity: Goal: Risk for impaired skin integrity will decrease Outcome: Adequate for Discharge

## 2023-09-04 NOTE — Progress Notes (Signed)
 Erroneous Encounter

## 2023-09-04 NOTE — Discharge Summary (Signed)
 Physician Discharge Summary  [facility] ZDG:[id number] DOB: 15-Mar-1981 DOA: 08/31/2023  PCP: [name], NP  Admit date: 08/31/2023  Discharge date: 09/04/2023  Discharge Diagnoses:  Principal Problem:   Sickle cell pain crisis (HCC) Active Problems:   History of pulmonary embolism   Gastro-esophageal reflux disease without esophagitis   Anemia of chronic disease   Mild intermittent asthma without complication   Sickle cell anemia with pain (HCC)   GAD (generalized anxiety disorder)   History of asthma   History of sickle cell disease   Discharge Condition: Stable  Disposition:  Pt is discharged home in good condition and is to follow up with [name], NP this week to have labs evaluated. Molly Gutierrez is instructed to increase activity slowly and balance with rest for the next few days, and use prescribed medication to complete treatment of pain  Diet: Regular Wt Readings from Last 3 Encounters:  08/31/23 56.4 kg  08/06/23 54.4 kg  07/08/23 61 kg    History of present illness:  Molly Gutierrez is a 43 year old female with a medical history significant for sickle cell disease, chronic pain syndrome, opiate dependence and tolerance, history of PE on Xarelto, history of mild intermittent asthma, and anemia of chronic disease presents with complaints of generalized pain that is consistent with her previous sickle cell pain crisis. Patient was evaluated in emergency department and was appropriate to transition to inpatient for management of her pain crisis. Patient states that she has been taking her home Dilaudid consistently without very much relief. She has been attempting to manage pain at home over the past several days. she rates pain as 10/10 in chest and lower back, constant, and aching. Patient denies any fever, chills, chest pain, or shortness of breath. No urinary symptoms, nausea, vomiting, or diarrhea. No sick contacts or recent travel.    ED course:  In the ED, chest x-ray is non-acute. No evidence of acute ischemia on 12-lead EKG. The patient received several rounds of IV opioid-based narcotics with mild improvement. Still reporting uncontrolled pain. TRH, hospitalist service, was asked to admit for pain control. Temperature 97.7. BP 95/76, pulse rate 68, respiratory rate 17, O2 saturation 100% on room air. Lab studies notable for serum potassium 3.1, T bilirubin 3.6. WBC 13.9, hemoglobin 7.0, platelet count 485    Hospital Course:  Patient was admitted for sickle cell pain crisis and managed appropriately with IVF, IV Dilaudid via PCA and IV Toradol, as well as other adjunct therapies per sickle cell pain management protocols. Patient responded well to this regimen. Her pain has significantly improved to base line, patient is ambulating well with no significant pain and tolerating p.o. intake with no restrictions. Patient feels well enough to go home. Platelet count 353, HGB stable 7.0 within patients range,  Patient was therefore discharged home today in a hemodynamically stable condition.  Molly Gutierrez will follow-up with PCP within 1 week of this discharge. Molly Gutierrez was counseled extensively about nonpharmacologic means of pain management, patient verbalized understanding and was appreciative of  the care received during this admission.   We discussed the need for good hydration, monitoring of hydration status, avoidance of heat, cold, stress, and infection triggers. We discussed the need to be adherent with taking Hydrea and other home medications. Patient was reminded of the need to seek medical attention immediately if any symptom of bleeding, anemia, or infection occurs.  Discharge Exam: Vitals:   09/04/23 1319 09/04/23 1411  BP:  106/66  Pulse:  70  Resp: 14 16  Temp:  99 F (37.2 C)  SpO2: 100% 100%   Vitals:   09/04/23 0859 09/04/23 1056 09/04/23 1319 09/04/23 1411  BP:  97/65  106/66  Pulse:  78  70  Resp: 18  16 14 16   Temp:  98.6 F (37 C)  99 F (37.2 C)  TempSrc:  Oral  Oral  SpO2: 96% 97% 100% 100%  Weight:      Height:        General appearance : Awake, alert, not in any distress. Speech Clear. Not toxic looking HEENT: Atraumatic and Normocephalic, pupils equally reactive to light and accomodation Neck: Supple, no JVD. No cervical lymphadenopathy.  Chest: Good air entry bilaterally, no added sounds  CVS: S1 S2 regular, no murmurs.  Abdomen: Bowel sounds present, Non tender and not distended with no gaurding, rigidity or rebound. Extremities: B/L Lower Ext shows no edema, both legs are warm to touch Neurology: Awake alert, and oriented X 3, CN II-XII intact, Non focal Skin: No Rash  Discharge Instructions  Discharge Instructions     Diet - low sodium heart healthy   Complete by: As directed    Increase activity slowly   Complete by: As directed       Allergies as of 09/04/2023       Reactions   Cefaclor Hives, Swelling, Rash   Hydroxyurea Palpitations, Other (See Comments)   Lowers "blood levels" and heart rate (causes HYPOtension); "it messes me up, it drops my levels and stuff" and Hypotension   Omeprazole Other (See Comments)   Causes "sharp pains in the stomach"   Ketamine Palpitations, Other (See Comments)   "Pt states she has had previous reaction to ketamine. States she becomes flushed, heart races, dizzy, and feels like she is going to pass out."        Medication List     TAKE these medications    acetaminophen 500 MG tablet Commonly known as: TYLENOL Take 500 mg by mouth every 6 (six) hours as needed for mild pain or headache (or cramps).   albuterol 108 (90 Base) MCG/ACT inhaler Commonly known as: VENTOLIN HFA TAKE 2 PUFFS BY MOUTH EVERY 6 HOURS AS NEEDED FOR WHEEZE OR SHORTNESS OF BREATH What changed: See the new instructions.   Deferiprone 500 MG Tabs Commonly known as: Ferriprox Take 500 mg by mouth 2 (two) times daily. What changed:  how  much to take when to take this   diphenhydrAMINE 25 MG tablet Commonly known as: BENADRYL Take 25 mg by mouth See admin instructions. Take 25 mg by mouth every four to six hours with each dose of Hydromorphone   fluticasone 50 MCG/ACT nasal spray Commonly known as: FLONASE Place 1 spray into both nostrils daily as needed for allergies or rhinitis.   folic acid 1 MG tablet Commonly known as: FOLVITE Take 1 tablet (1 mg total) by mouth daily.   HYDROmorphone 4 MG tablet Commonly known as: Dilaudid Take 1 tablet (4 mg total) by mouth every 6 (six) hours as needed for up to 15 days for severe pain (pain score 7-10).   mirtazapine 45 MG tablet Commonly known as: REMERON Take 1 tablet (45 mg total) by mouth at bedtime.   naloxone 4 MG/0.1ML Liqd nasal spray kit Commonly known as: NARCAN Place 0.1 sprays (0.4 mg total) into the nose once as needed (opioid overdose).   polyethylene glycol 17 g packet Commonly known as: MIRALAX / GLYCOLAX Take 17 g by  mouth daily as needed for mild constipation (MIX AS DIRECTED AND DRINK).   promethazine 25 MG tablet Commonly known as: PHENERGAN Take 0.5-1 tablets (12.5-25 mg total) by mouth every 6 (six) hours as needed for nausea or vomiting.   Symbicort 80-4.5 MCG/ACT inhaler Generic drug: budesonide-formoterol INHALE 2 PUFFS INTO THE LUNGS TWICE A DAY What changed: See the new instructions.   VITAMIN B-12 PO Take 1 tablet by mouth at bedtime.   Vitamin D (Ergocalciferol) 1.25 MG (50000 UNIT) Caps capsule Commonly known as: DRISDOL Take 1 capsule (50,000 Units total) by mouth once a week. What changed: when to take this   VITAMIN D3 PO Take 1 capsule by mouth at bedtime.   rivaroxaban 20 MG Tabs tablet Commonly known as: XARELTO Take 20 mg by mouth every evening. What changed: Another medication with the same name was changed. Make sure you understand how and when to take each.   Xarelto 20 MG Tabs tablet Generic drug:  rivaroxaban Take 1 tablet (20 mg total) by mouth daily with supper. What changed: when to take this        The results of significant diagnostics from this hospitalization (including imaging, microbiology, ancillary and laboratory) are listed below for reference.    Significant Diagnostic Studies: DG Chest 2 View Result Date: 08/31/2023 CLINICAL DATA:  Sickle cell, chest pain EXAM: CHEST - 2 VIEW COMPARISON:  Radiograph 04/22/2023 FINDINGS: Stable cardiomediastinal silhouette. Unchanged bilateral chest wall port. No focal consolidation, pleural effusion, or pneumothorax. No displaced rib fractures. IMPRESSION: No active cardiopulmonary disease. Electronically Signed   By: Minerva Fester M.D.   On: 08/31/2023 01:20    Microbiology: No results found for this or any previous visit (from the past 240 hours).   Labs: Basic Metabolic Panel: Recent Labs  Lab 08/31/23 0100 08/31/23 0544 09/01/23 0525  NA 136 136 136  K 3.8 3.9 3.8  CL 106 106 108  CO2 23 24 24   GLUCOSE 100* 112* 99  BUN 7 7 9   CREATININE 0.46 0.53 0.55  CALCIUM 8.7* 8.6* 8.4*   Liver Function Tests: Recent Labs  Lab 08/31/23 0544  AST 31  ALT 13  ALKPHOS 49  BILITOT 3.5*  PROT 7.6  ALBUMIN 3.9   No results for input(s): "LIPASE", "AMYLASE" in the last 168 hours. No results for input(s): "AMMONIA" in the last 168 hours. CBC: Recent Labs  Lab 08/31/23 0100 08/31/23 0544 09/01/23 0525 09/02/23 0515  WBC 13.6* 13.9* 13.8* 15.9*  NEUTROABS 8.2* 7.8*  --   --   HGB 7.9* 7.0* 6.6* 7.7*  HCT 23.2* 20.0* 18.5* 21.9*  MCV 92.4 90.9 88.5 88.3  PLT 485* 353 408* 403*   Cardiac Enzymes: No results for input(s): "CKTOTAL", "CKMB", "CKMBINDEX", "TROPONINI" in the last 168 hours. BNP: Invalid input(s): "POCBNP" CBG: No results for input(s): "GLUCAP" in the last 168 hours.  Time coordinating discharge: 50 minutes  Signed:  Daryll Drown NP  [facility] 09/04/2023, 3:39 PM

## 2023-09-04 NOTE — Progress Notes (Signed)
 IV removed, Port deaccessed, dressed, belongings packed, instructions reviewed with understanding verbalized, transferred to front entrance via wheelchair.

## 2023-09-05 ENCOUNTER — Telehealth: Payer: Self-pay | Admitting: *Deleted

## 2023-09-05 NOTE — Transitions of Care (Post Inpatient/ED Visit) (Signed)
   09/05/2023  Name: Molly Gutierrez MRN: [id number] DOB: [birth year]  Today's TOC FU Call Status: Today's TOC FU Call Status:: Successful TOC FU Call Completed TOC FU Call Complete Date: 09/05/23 Patient's Name and Date of Birth confirmed.  Transition Care Management Follow-up Telephone Call Date of Discharge: 09/04/23 Discharge Facility: [facility]) Type of Discharge: Inpatient Admission Primary Inpatient Discharge Diagnosis:: Sickle cell pain crisis How have you been since you were released from the hospital?: Better (states "feel much better, pain is under control", eating and drinking well, reports independent with ADL's) Any questions or concerns?: No  Items Reviewed: Did you receive and understand the discharge instructions provided?: Yes Medications obtained,verified, and reconciled?: No Medications Not Reviewed Reasons:: Other: (pt states she has medications, unable to complete medication reconcilliation due to pt hung up twice,(or call dropped) unable to reach pt for further assessment) Any new allergies since your discharge?: No Dietary orders reviewed?: Yes Type of Diet Ordered:: heart healthy Do you have support at home?: Yes People in Home: child(ren), dependent Name of Support/Comfort Primary Source: live with 2 children (age 37 and 38)  can call on family if needed  Medications Reviewed Today: Medications Reviewed Today   Medications were not reviewed in this encounter     Home Care and Equipment/Supplies: Were Home Health Services Ordered?: No Any new equipment or medical supplies ordered?: No  Functional Questionnaire: Do you need assistance with bathing/showering or dressing?: No Do you need assistance with meal preparation?: No Do you need assistance with eating?: No Do you have difficulty maintaining continence: No Do you need assistance with getting out of bed/getting out of a chair/moving?: No Do you have difficulty managing or taking your  medications?: No  Follow up appointments reviewed: PCP Follow-up appointment confirmed?: Yes Date of PCP follow-up appointment?: 10/13/23 Follow-up Provider: Larena Sox  NP  @ 1020 am, RN Care Manager offered to make sooner appointment, pt verbalizes understanding of seeing provider in one week and labwork to be completed and will schedule appointment herself Specialist Hospital Follow-up appointment confirmed?: NA Do you need transportation to your follow-up appointment?: No Do you understand care options if your condition(s) worsen?: Yes-patient verbalized understanding  Unable to complete SDOH assessments  Irving Shows [facility], BSN RN Care Manager/ Transition of Care [place]/ [facility] [phone]

## 2023-09-10 ENCOUNTER — Other Ambulatory Visit: Payer: Self-pay

## 2023-09-11 DIAGNOSIS — R0789 Other chest pain: Secondary | ICD-10-CM | POA: Diagnosis not present

## 2023-09-11 DIAGNOSIS — Z7901 Long term (current) use of anticoagulants: Secondary | ICD-10-CM | POA: Diagnosis not present

## 2023-09-11 DIAGNOSIS — Z7951 Long term (current) use of inhaled steroids: Secondary | ICD-10-CM | POA: Diagnosis not present

## 2023-09-11 DIAGNOSIS — R079 Chest pain, unspecified: Secondary | ICD-10-CM | POA: Diagnosis not present

## 2023-09-11 DIAGNOSIS — G894 Chronic pain syndrome: Secondary | ICD-10-CM | POA: Diagnosis not present

## 2023-09-11 DIAGNOSIS — Z86711 Personal history of pulmonary embolism: Secondary | ICD-10-CM | POA: Diagnosis not present

## 2023-09-11 DIAGNOSIS — D5700 Hb-SS disease with crisis, unspecified: Secondary | ICD-10-CM | POA: Diagnosis not present

## 2023-09-11 DIAGNOSIS — F32A Depression, unspecified: Secondary | ICD-10-CM | POA: Diagnosis not present

## 2023-09-12 ENCOUNTER — Other Ambulatory Visit (HOSPITAL_COMMUNITY): Payer: Self-pay

## 2023-09-15 ENCOUNTER — Other Ambulatory Visit: Payer: Self-pay

## 2023-09-15 ENCOUNTER — Other Ambulatory Visit (HOSPITAL_COMMUNITY): Payer: Self-pay

## 2023-09-15 ENCOUNTER — Other Ambulatory Visit: Payer: Self-pay | Admitting: Internal Medicine

## 2023-09-15 DIAGNOSIS — D5700 Hb-SS disease with crisis, unspecified: Secondary | ICD-10-CM

## 2023-09-15 NOTE — Progress Notes (Signed)
 Specialty Pharmacy Refill Coordination Note  Molly Gutierrez is a 43 y.o. female contacted today regarding refills of specialty medication(s) Deferiprone   Patient requested Delivery   Delivery date: 09/18/23   Verified address: [address]   [place] [address]   Medication will be filled on 09/17/23.

## 2023-09-16 ENCOUNTER — Other Ambulatory Visit: Payer: Self-pay

## 2023-09-16 ENCOUNTER — Other Ambulatory Visit: Payer: Self-pay | Admitting: Internal Medicine

## 2023-09-16 ENCOUNTER — Other Ambulatory Visit (HOSPITAL_COMMUNITY): Payer: Self-pay

## 2023-09-16 ENCOUNTER — Telehealth: Payer: Self-pay | Admitting: *Deleted

## 2023-09-16 DIAGNOSIS — D5700 Hb-SS disease with crisis, unspecified: Secondary | ICD-10-CM

## 2023-09-16 NOTE — Transitions of Care (Post Inpatient/ED Visit) (Signed)
   09/16/2023  Name: Molly Gutierrez MRN: [id number] DOB: 27-Jan-1981  Today's TOC FU Call Status: Today's TOC FU Call Status:: Unsuccessful Call (1st Attempt) Unsuccessful Call (1st Attempt) Date: 09/16/23  Attempted to reach the patient regarding the most recent Inpatient/ED visit.  Follow Up Plan: Additional outreach attempts will be made to reach the patient to complete the Transitions of Care (Post Inpatient/ED visit) call.   Irving Shows [facility], BSN RN Care Manager/ Transition of Care [place]/ [facility] [phone]

## 2023-09-17 ENCOUNTER — Other Ambulatory Visit: Payer: Self-pay | Admitting: Nurse Practitioner

## 2023-09-17 ENCOUNTER — Telehealth: Payer: Self-pay | Admitting: *Deleted

## 2023-09-17 ENCOUNTER — Other Ambulatory Visit (HOSPITAL_COMMUNITY): Payer: Self-pay

## 2023-09-17 ENCOUNTER — Other Ambulatory Visit: Payer: Self-pay | Admitting: Internal Medicine

## 2023-09-17 ENCOUNTER — Other Ambulatory Visit: Payer: Self-pay

## 2023-09-17 DIAGNOSIS — D5700 Hb-SS disease with crisis, unspecified: Secondary | ICD-10-CM

## 2023-09-17 MED ORDER — HYDROMORPHONE HCL 4 MG PO TABS
4.0000 mg | ORAL_TABLET | Freq: Four times a day (QID) | ORAL | 0 refills | Status: DC | PRN
  Filled 2023-09-17 – 2023-09-18 (×2): qty 60, 15d supply, fill #0

## 2023-09-17 MED ORDER — XARELTO 20 MG PO TABS
20.0000 mg | ORAL_TABLET | Freq: Every day | ORAL | 3 refills | Status: AC
  Filled 2023-09-17: qty 90, 90d supply, fill #0
  Filled 2023-10-17 – 2023-12-01 (×4): qty 90, 90d supply, fill #1
  Filled 2024-02-29: qty 90, 90d supply, fill #2
  Filled 2024-05-11 – 2024-07-15 (×3): qty 90, 90d supply, fill #3

## 2023-09-17 NOTE — Transitions of Care (Post Inpatient/ED Visit) (Signed)
   09/17/2023  Name: Molly Gutierrez MRN: [id number] DOB: [birth year]  Today's TOC FU Call Status: Today's TOC FU Call Status:: Unsuccessful Call (2nd Attempt) Unsuccessful Call (2nd Attempt) Date: 09/17/23  Attempted to reach the patient regarding the most recent Inpatient/ED visit.- spoke with pt who reports she is driving and this is not a good time for her to talk, request a call back on 09/18/23 if possible.  Follow Up Plan: Additional outreach attempts will be made to reach the patient to complete the Transitions of Care (Post Inpatient/ED visit) call.   Irving Shows [facility], BSN RN Care Manager/ Transition of Care [place]/ [facility] [phone]

## 2023-09-17 NOTE — Telephone Encounter (Signed)
 Copied from CRM [id number]. Topic: Clinical - Medication Refill >> Sep 17, 2023 11:15 AM Elle L wrote: Most Recent Primary Care Visit:  Provider: Mariam Dollar VISIT  Department: [facility]  Visit Type: MEDICARE AWV, SEQUENTIAL  Date: 07/24/2023  Medication: HYDROmorphone (DILAUDID) 4 MG tablet  Has the patient contacted their pharmacy? Yes  Is this the correct pharmacy for this prescription? Yes  This is the patient's preferred pharmacy:  [facility] [address] [place] [address] Phone: [phone] Fax: [phone]  Has the prescription been filled recently? Yes  Is the patient out of the medication? Yes  Has the patient been seen for an appointment in the last year OR does the patient have an upcoming appointment? Yes  Can we respond through MyChart? Yes  Agent: Please be advised that Rx refills may take up to 3 business days. We ask that you follow-up with your pharmacy.

## 2023-09-18 ENCOUNTER — Other Ambulatory Visit (HOSPITAL_COMMUNITY): Payer: Self-pay

## 2023-09-18 ENCOUNTER — Telehealth: Payer: Self-pay | Admitting: *Deleted

## 2023-09-18 NOTE — Transitions of Care (Post Inpatient/ED Visit) (Signed)
   09/18/2023  Name: Molly Gutierrez MRN: [id number] DOB: June 16, 1981  Today's TOC FU Call Status: Today's TOC FU Call Status:: Unsuccessful Call (3rd Attempt) Unsuccessful Call (3rd Attempt) Date: 09/18/23 [facility] FU Call Complete Date: 09/18/23  Attempted to reach the patient regarding the most recent Inpatient/ED visit.  Follow Up Plan: No further outreach attempts will be made at this time. We have been unable to contact the patient.  Irving Shows [facility], BSN RN Care Manager/ Transition of Care [place]/ [facility] [phone]

## 2023-09-27 ENCOUNTER — Inpatient Hospital Stay (HOSPITAL_COMMUNITY)
Admission: EM | Admit: 2023-09-27 | Discharge: 2023-10-01 | DRG: 812 | Disposition: A | Discharge status: Home / Self Care | Attending: Internal Medicine | Admitting: Internal Medicine

## 2023-09-27 ENCOUNTER — Encounter (HOSPITAL_COMMUNITY): Payer: Self-pay | Admitting: Emergency Medicine

## 2023-09-27 ENCOUNTER — Other Ambulatory Visit: Payer: Self-pay

## 2023-09-27 DIAGNOSIS — Z841 Family history of disorders of kidney and ureter: Secondary | ICD-10-CM | POA: Diagnosis not present

## 2023-09-27 DIAGNOSIS — Z7901 Long term (current) use of anticoagulants: Secondary | ICD-10-CM | POA: Diagnosis not present

## 2023-09-27 DIAGNOSIS — Z7951 Long term (current) use of inhaled steroids: Secondary | ICD-10-CM | POA: Diagnosis not present

## 2023-09-27 DIAGNOSIS — Z8249 Family history of ischemic heart disease and other diseases of the circulatory system: Secondary | ICD-10-CM

## 2023-09-27 DIAGNOSIS — D72829 Elevated white blood cell count, unspecified: Secondary | ICD-10-CM | POA: Diagnosis present

## 2023-09-27 DIAGNOSIS — Z833 Family history of diabetes mellitus: Secondary | ICD-10-CM | POA: Diagnosis not present

## 2023-09-27 DIAGNOSIS — Z86711 Personal history of pulmonary embolism: Secondary | ICD-10-CM | POA: Diagnosis not present

## 2023-09-27 DIAGNOSIS — D638 Anemia in other chronic diseases classified elsewhere: Secondary | ICD-10-CM | POA: Diagnosis present

## 2023-09-27 DIAGNOSIS — Z888 Allergy status to other drugs, medicaments and biological substances status: Secondary | ICD-10-CM | POA: Diagnosis not present

## 2023-09-27 DIAGNOSIS — J4520 Mild intermittent asthma, uncomplicated: Secondary | ICD-10-CM | POA: Diagnosis not present

## 2023-09-27 DIAGNOSIS — G894 Chronic pain syndrome: Secondary | ICD-10-CM | POA: Diagnosis not present

## 2023-09-27 DIAGNOSIS — Z83438 Family history of other disorder of lipoprotein metabolism and other lipidemia: Secondary | ICD-10-CM

## 2023-09-27 DIAGNOSIS — D5700 Hb-SS disease with crisis, unspecified: Principal | ICD-10-CM | POA: Diagnosis present

## 2023-09-27 DIAGNOSIS — Z885 Allergy status to narcotic agent status: Secondary | ICD-10-CM | POA: Diagnosis not present

## 2023-09-27 LAB — CBC WITH DIFFERENTIAL/PLATELET
Abs Immature Granulocytes: 0.09 10*3/uL — ABNORMAL HIGH (ref 0.00–0.07)
Basophils Absolute: 0.1 10*3/uL (ref 0.0–0.1)
Basophils Relative: 0 %
Eosinophils Absolute: 0.4 10*3/uL (ref 0.0–0.5)
Eosinophils Relative: 3 %
HCT: 23.2 % — ABNORMAL LOW (ref 36.0–46.0)
Hemoglobin: 8 g/dL — ABNORMAL LOW (ref 12.0–15.0)
Immature Granulocytes: 1 %
Lymphocytes Relative: 18 %
Lymphs Abs: 2.8 10*3/uL (ref 0.7–4.0)
MCH: 31.7 pg (ref 26.0–34.0)
MCHC: 34.5 g/dL (ref 30.0–36.0)
MCV: 92.1 fL (ref 80.0–100.0)
Monocytes Absolute: 2.1 10*3/uL — ABNORMAL HIGH (ref 0.1–1.0)
Monocytes Relative: 14 %
Neutro Abs: 9.9 10*3/uL — ABNORMAL HIGH (ref 1.7–7.7)
Neutrophils Relative %: 64 %
Platelets: 439 10*3/uL — ABNORMAL HIGH (ref 150–400)
RBC: 2.52 MIL/uL — ABNORMAL LOW (ref 3.87–5.11)
RDW: 19.9 % — ABNORMAL HIGH (ref 11.5–15.5)
WBC: 15.4 10*3/uL — ABNORMAL HIGH (ref 4.0–10.5)
nRBC: 0.8 % — ABNORMAL HIGH (ref 0.0–0.2)

## 2023-09-27 LAB — COMPREHENSIVE METABOLIC PANEL WITH GFR
ALT: 14 U/L (ref 0–44)
AST: 44 U/L — ABNORMAL HIGH (ref 15–41)
Albumin: 3.9 g/dL (ref 3.5–5.0)
Alkaline Phosphatase: 45 U/L (ref 38–126)
Anion gap: 7 (ref 5–15)
BUN: 9 mg/dL (ref 6–20)
CO2: 23 mmol/L (ref 22–32)
Calcium: 8.3 mg/dL — ABNORMAL LOW (ref 8.9–10.3)
Chloride: 105 mmol/L (ref 98–111)
Creatinine, Ser: 0.41 mg/dL — ABNORMAL LOW (ref 0.44–1.00)
GFR, Estimated: >60 mL/min (ref >60)
Glucose, Bld: 95 mg/dL (ref 70–99)
Potassium: 3.4 mmol/L — ABNORMAL LOW (ref 3.5–5.1)
Sodium: 135 mmol/L (ref 135–145)
Total Bilirubin: 2.5 mg/dL — ABNORMAL HIGH (ref 0.0–1.2)
Total Protein: 7.5 g/dL (ref 6.5–8.1)

## 2023-09-27 LAB — RETICULOCYTES
Immature Retic Fract: 24.7 % — ABNORMAL HIGH (ref 2.3–15.9)
RBC.: 2.53 MIL/uL — ABNORMAL LOW (ref 3.87–5.11)
Retic Count, Absolute: 474.6 10*3/uL — ABNORMAL HIGH (ref 19.0–186.0)
Retic Ct Pct: 18.2 % — ABNORMAL HIGH (ref 0.4–3.1)

## 2023-09-27 LAB — HCG, SERUM, QUALITATIVE: Preg, Serum: NEGATIVE

## 2023-09-27 MED ORDER — HYDROMORPHONE 1 MG/ML IV SOLN
INTRAVENOUS | Status: DC
  Administered 2023-09-27: 30 mg via INTRAVENOUS
  Administered 2023-09-27: 1.5 mg via INTRAVENOUS
  Administered 2023-09-27: 1 mg via INTRAVENOUS
  Administered 2023-09-28: 30 mg via INTRAVENOUS
  Administered 2023-09-28: 4.5 mg via INTRAVENOUS
  Administered 2023-09-28: 1 mg via INTRAVENOUS
  Administered 2023-09-28: 1.5 mg via INTRAVENOUS
  Administered 2023-09-28: 2.5 mg via INTRAVENOUS
  Administered 2023-09-28: 3.5 mg via INTRAVENOUS
  Administered 2023-09-28: 5.5 mg via INTRAVENOUS
  Administered 2023-09-29: 6.5 mg via INTRAVENOUS
  Administered 2023-09-29: 4.5 mg via INTRAVENOUS
  Administered 2023-09-29: 3 mg via INTRAVENOUS
  Administered 2023-09-29: 30 mg via INTRAVENOUS
  Administered 2023-09-29 (×2): 4 mg via INTRAVENOUS
  Administered 2023-09-30 (×2): 4.5 mg via INTRAVENOUS
  Administered 2023-09-30: 5.5 mg via INTRAVENOUS
  Administered 2023-09-30: 30 mg via INTRAVENOUS
  Administered 2023-09-30: 3.5 mg via INTRAVENOUS
  Administered 2023-09-30: 3 mg via INTRAVENOUS
  Administered 2023-10-01: 6.5 mg via INTRAVENOUS
  Administered 2023-10-01: 3 mg via INTRAVENOUS
  Filled 2023-09-27 (×4): qty 30

## 2023-09-27 MED ORDER — RIVAROXABAN 20 MG PO TABS
20.0000 mg | ORAL_TABLET | Freq: Every day | ORAL | Status: DC
  Administered 2023-09-27 – 2023-09-30 (×4): 20 mg via ORAL
  Filled 2023-09-27 (×4): qty 1

## 2023-09-27 MED ORDER — HYDROMORPHONE HCL 1 MG/ML IJ SOLN
2.0000 mg | INTRAMUSCULAR | Status: AC
  Administered 2023-09-27: 2 mg via INTRAVENOUS
  Filled 2023-09-27: qty 2

## 2023-09-27 MED ORDER — HYDROMORPHONE HCL 1 MG/ML IJ SOLN
2.0000 mg | INTRAMUSCULAR | Status: DC | PRN
  Administered 2023-09-27: 2 mg via INTRAVENOUS
  Filled 2023-09-27: qty 2

## 2023-09-27 MED ORDER — DIPHENHYDRAMINE HCL 25 MG PO CAPS
25.0000 mg | ORAL_CAPSULE | ORAL | Status: DC | PRN
  Administered 2023-09-27 – 2023-10-01 (×6): 25 mg via ORAL
  Filled 2023-09-27 (×7): qty 1

## 2023-09-27 MED ORDER — ORAL CARE MOUTH RINSE: 15.0000 mL | OROMUCOSAL | Status: DC | PRN

## 2023-09-27 MED ORDER — SENNOSIDES-DOCUSATE SODIUM 8.6-50 MG PO TABS
1.0000 | ORAL_TABLET | Freq: Two times a day (BID) | ORAL | Status: DC
  Administered 2023-09-27 – 2023-10-01 (×9): 1 via ORAL
  Filled 2023-09-27 (×9): qty 1

## 2023-09-27 MED ORDER — KETOROLAC TROMETHAMINE 15 MG/ML IJ SOLN
15.0000 mg | INTRAMUSCULAR | Status: AC
  Administered 2023-09-27: 15 mg via INTRAVENOUS
  Filled 2023-09-27: qty 1

## 2023-09-27 MED ORDER — POLYETHYLENE GLYCOL 3350 17 G PO PACK: 17.0000 g | PACK | Freq: Every day | ORAL | Status: DC | PRN

## 2023-09-27 MED ORDER — KETOROLAC TROMETHAMINE 15 MG/ML IJ SOLN
15.0000 mg | Freq: Four times a day (QID) | INTRAMUSCULAR | Status: DC
  Administered 2023-09-27 – 2023-10-01 (×16): 15 mg via INTRAVENOUS
  Filled 2023-09-27 (×16): qty 1

## 2023-09-27 MED ORDER — ONDANSETRON HCL 4 MG/2ML IJ SOLN: 4.0000 mg | INTRAMUSCULAR | Status: DC | PRN

## 2023-09-27 MED ORDER — VITAMIN D 25 MCG (1000 UNIT) PO TABS
1000.0000 [IU] | ORAL_TABLET | Freq: Every day | ORAL | Status: DC
  Administered 2023-09-27 – 2023-10-01 (×5): 1000 [IU] via ORAL
  Filled 2023-09-27 (×5): qty 1

## 2023-09-27 MED ORDER — DIPHENHYDRAMINE HCL 25 MG PO CAPS
25.0000 mg | ORAL_CAPSULE | Freq: Four times a day (QID) | ORAL | Status: DC | PRN
  Administered 2023-09-27: 25 mg via ORAL
  Filled 2023-09-27: qty 1

## 2023-09-27 MED ORDER — FOLIC ACID 1 MG PO TABS
1.0000 mg | ORAL_TABLET | Freq: Every day | ORAL | Status: DC
  Administered 2023-09-27 – 2023-10-01 (×5): 1 mg via ORAL
  Filled 2023-09-27 (×5): qty 1

## 2023-09-27 MED ORDER — DEFERIPRONE 500 MG PO TABS
1000.0000 mg | ORAL_TABLET | Freq: Every day | ORAL | Status: DC
Start: 2023-09-28 — End: 2023-10-01
  Administered 2023-09-28: 1000 mg via ORAL

## 2023-09-27 MED ORDER — NALOXONE HCL 0.4 MG/ML IJ SOLN: 0.4000 mg | INTRAMUSCULAR | Status: DC | PRN

## 2023-09-27 MED ORDER — DEXTROSE-SODIUM CHLORIDE 5-0.45 % IV SOLN: INTRAVENOUS | Status: DC

## 2023-09-27 MED ORDER — MIRTAZAPINE 30 MG PO TABS
45.0000 mg | ORAL_TABLET | Freq: Every day | ORAL | Status: DC
  Administered 2023-09-27 – 2023-09-30 (×4): 45 mg via ORAL
  Filled 2023-09-27 (×4): qty 1

## 2023-09-27 MED ORDER — SODIUM CHLORIDE 0.9% FLUSH: 9.0000 mL | INTRAVENOUS | Status: DC | PRN

## 2023-09-27 MED ORDER — ALBUTEROL SULFATE (2.5 MG/3ML) 0.083% IN NEBU
2.5000 mg | INHALATION_SOLUTION | Freq: Four times a day (QID) | RESPIRATORY_TRACT | Status: DC | PRN
Start: 2023-09-27 — End: 2023-10-01

## 2023-09-27 MED ORDER — PROCHLORPERAZINE EDISYLATE 10 MG/2ML IJ SOLN
10.0000 mg | Freq: Once | INTRAMUSCULAR | Status: AC
  Administered 2023-09-27: 10 mg via INTRAVENOUS
  Filled 2023-09-27: qty 2

## 2023-09-27 NOTE — ED Notes (Signed)
 Port Accessed by present nurse

## 2023-09-27 NOTE — Plan of Care (Signed)

## 2023-09-27 NOTE — ED Provider Notes (Signed)
 [place] [facility] Provider Note   CSN: [id number] Arrival date & time: 09/27/23  0250     History  Chief Complaint  Patient presents with   Sickle Cell Pain Crisis    Molly Gutierrez  is a 43 y.o. female.  The history is provided by the patient.  Sickle Cell Pain Crisis Location:  Lower extremity and upper extremity Severity:  Severe Onset quality:  Gradual Duration:  1 day Similar to previous crisis episodes: yes   Timing:  Constant Progression:  Worsening Chronicity:  New Context: not alcohol consumption   Relieved by:  Nothing Worsened by:  Nothing Ineffective treatments:  None tried Associated symptoms: no chest pain, no fever, no sore throat, no swelling of legs, no vision change, no vomiting and no wheezing   Risk factors: no recent air travel       Past Medical History:  Diagnosis Date   Asthma    Eczema    History of pulmonary embolus (PE)    Sickle cell anemia (HCC)      Home Medications Prior to Admission medications   Medication Sig Start Date End Date Taking? Authorizing Provider  acetaminophen (TYLENOL) 500 MG tablet Take 500 mg by mouth every 6 (six) hours as needed for mild pain or headache (or cramps).   Yes [provider]  albuterol (VENTOLIN HFA) 108 (90 Base) MCG/ACT inhaler TAKE 2 PUFFS BY MOUTH EVERY 6 HOURS AS NEEDED FOR WHEEZE OR SHORTNESS OF BREATH Patient taking differently: Inhale 2 puffs into the lungs every 6 (six) hours as needed for wheezing or shortness of breath. 01/22/23  Yes Jerrlyn Morel, NP  budesonide-formoterol (SYMBICORT) 80-4.5 MCG/ACT inhaler INHALE 2 PUFFS INTO THE LUNGS TWICE A DAY Patient taking differently: Inhale 2 puffs into the lungs 2 (two) times daily as needed. 01/27/23  Yes Nichols, Tonya S, NP  Cholecalciferol (VITAMIN D3 PO) Take 1 capsule by mouth at bedtime.   Yes [provider]  Cyanocobalamin (VITAMIN B-12 PO) Take 1 tablet by mouth at bedtime.    Yes [provider]  Deferiprone (FERRIPROX) 500 MG TABS Take 500 mg by mouth 2 (two) times daily. Patient taking differently: Take 1,000 mg by mouth daily. 05/14/23  Yes Sigurd Driver, FNP  diphenhydrAMINE (BENADRYL) 25 MG tablet Take 25 mg by mouth See admin instructions. Take 25 mg by mouth every four to six hours with each dose of Hydromorphone   Yes [provider]  folic acid (FOLVITE) 1 MG tablet Take 1 tablet (1 mg total) by mouth daily. 12/23/22  Yes Nichols, Tonya S, NP  HYDROmorphone (DILAUDID) 4 MG tablet Take 1 tablet (4 mg total) by mouth every 6 (six) hours as needed for up to 15 days for severe pain (pain score 7-10). 09/17/23 10/03/23 Yes Jerrlyn Morel, NP  mirtazapine (REMERON) 45 MG tablet Take 1 tablet (45 mg total) by mouth at bedtime. 12/23/22 12/23/23 Yes Jerrlyn Morel, NP  naloxone [facility]) nasal spray 4 mg/0.1 mL Place 0.1 sprays (0.4 mg total) into the nose once as needed (opioid overdose). 12/23/22  Yes Jerrlyn Morel, NP  polyethylene glycol (MIRALAX / GLYCOLAX) 17 g packet Take 17 g by mouth daily as needed for mild constipation (MIX AS DIRECTED AND DRINK). 12/23/22  Yes Jerrlyn Morel, NP  promethazine (PHENERGAN) 25 MG tablet Take 0.5-1 tablets (12.5-25 mg total) by mouth every 6 (six) hours as needed for nausea or vomiting. 08/06/23 08/05/24 Yes Jerrlyn Morel,  NP  Vitamin D, Ergocalciferol, (DRISDOL) 1.25 MG (50000 UNIT) CAPS capsule Take 1 capsule (50,000 Units total) by mouth once a week. Patient taking differently: Take 50,000 Units by mouth every Wednesday. 12/23/22 12/23/23 Yes Jerrlyn Morel, NP  XARELTO 20 MG TABS tablet Take 1 tablet (20 mg total) by mouth daily with supper. 09/17/23 09/16/24 Yes Nichols, Tonya S, NP  fluticasone (FLONASE) 50 MCG/ACT nasal spray Place 1 spray into both nostrils daily as needed for allergies or rhinitis. 12/31/21 05/05/23  Jerrlyn Morel, NP      Allergies    Cefaclor, Hydroxyurea, Omeprazole, and Ketamine     Review of Systems   Review of Systems  Constitutional:  Negative for fever.  HENT:  Negative for sore throat.   Respiratory:  Negative for wheezing.   Cardiovascular:  Negative for chest pain.  Gastrointestinal:  Negative for vomiting.  Musculoskeletal:  Positive for arthralgias.  All other systems reviewed and are negative.   Physical Exam Updated Vital Signs BP 108/71   Pulse 73   Temp 99.2 F (37.3 C) (Oral)   Resp 13   Ht 5\' 3"  (1.6 m)   Wt 56 kg   LMP 08/15/2023 (Exact Date)   SpO2 95%   BMI 21.87 kg/m  Physical Exam Vitals and nursing note reviewed.  Constitutional:      General: She is not in acute distress.    Appearance: Normal appearance. She is well-developed.  HENT:     Head: Normocephalic and atraumatic.     Nose: Nose normal.  Eyes:     Pupils: Pupils are equal, round, and reactive to light.  Cardiovascular:     Rate and Rhythm: Normal rate and regular rhythm.     Pulses: Normal pulses.     Heart sounds: Normal heart sounds.  Pulmonary:     Effort: Pulmonary effort is normal. No respiratory distress.     Breath sounds: Normal breath sounds.  Abdominal:     General: Abdomen is flat. Bowel sounds are normal. There is no distension.     Palpations: Abdomen is soft.     Tenderness: There is no abdominal tenderness. There is no guarding or rebound.  Musculoskeletal:        General: Normal range of motion.     Cervical back: Neck supple.  Skin:    General: Skin is warm and dry.     Capillary Refill: Capillary refill takes less than 2 seconds.     Findings: No erythema or rash.  Neurological:     General: No focal deficit present.     Mental Status: She is alert and oriented to person, place, and time.     Deep Tendon Reflexes: Reflexes normal.  Psychiatric:        Mood and Affect: Mood normal.     ED Results / Procedures / Treatments   Labs (all labs ordered are listed, but only abnormal results are displayed) Results for orders placed or  performed during the hospital encounter of 09/27/23  Comprehensive metabolic panel   Collection Time: 09/27/23  3:21 AM  Result Value Ref Range   Sodium 135 135 - 145 mmol/L   Potassium 3.4 (L) 3.5 - 5.1 mmol/L   Chloride 105 98 - 111 mmol/L   CO2 23 22 - 32 mmol/L   Glucose, Bld 95 70 - 99 mg/dL   BUN 9 6 - 20 mg/dL   Creatinine, Ser 1.19 (L) 0.44 - 1.00 mg/dL   Calcium 8.3 (L) 8.9 -  10.3 mg/dL   Total Protein 7.5 6.5 - 8.1 g/dL   Albumin 3.9 3.5 - 5.0 g/dL   AST 44 (H) 15 - 41 U/L   ALT 14 0 - 44 U/L   Alkaline Phosphatase 45 38 - 126 U/L   Total Bilirubin 2.5 (H) 0.0 - 1.2 mg/dL   GFR, Estimated >56 >21 mL/min   Anion gap 7 5 - 15  CBC with Differential   Collection Time: 09/27/23  3:21 AM  Result Value Ref Range   WBC 15.4 (H) 4.0 - 10.5 K/uL   RBC 2.52 (L) 3.87 - 5.11 MIL/uL   Hemoglobin 8.0 (L) 12.0 - 15.0 g/dL   HCT 30.8 (L) 65.7 - 84.6 %   MCV 92.1 80.0 - 100.0 fL   MCH 31.7 26.0 - 34.0 pg   MCHC 34.5 30.0 - 36.0 g/dL   RDW 96.2 (H) 95.2 - 84.1 %   Platelets 439 (H) 150 - 400 K/uL   nRBC 0.8 (H) 0.0 - 0.2 %   Neutrophils Relative % 64 %   Neutro Abs 9.9 (H) 1.7 - 7.7 K/uL   Lymphocytes Relative 18 %   Lymphs Abs 2.8 0.7 - 4.0 K/uL   Monocytes Relative 14 %   Monocytes Absolute 2.1 (H) 0.1 - 1.0 K/uL   Eosinophils Relative 3 %   Eosinophils Absolute 0.4 0.0 - 0.5 K/uL   Basophils Relative 0 %   Basophils Absolute 0.1 0.0 - 0.1 K/uL   Immature Granulocytes 1 %   Abs Immature Granulocytes 0.09 (H) 0.00 - 0.07 K/uL  Reticulocytes   Collection Time: 09/27/23  3:21 AM  Result Value Ref Range   Retic Ct Pct 18.2 (H) 0.4 - 3.1 %   RBC. 2.53 (L) 3.87 - 5.11 MIL/uL   Retic Count, Absolute 474.6 (H) 19.0 - 186.0 K/uL   Immature Retic Fract 24.7 (H) 2.3 - 15.9 %  hCG, serum, qualitative   Collection Time: 09/27/23  3:21 AM  Result Value Ref Range   Preg, Serum NEGATIVE NEGATIVE   DG Chest 2 View Result Date: 08/31/2023 CLINICAL DATA:  Sickle cell, chest pain  EXAM: CHEST - 2 VIEW COMPARISON:  Radiograph 04/22/2023 FINDINGS: Stable cardiomediastinal silhouette. Unchanged bilateral chest wall port. No focal consolidation, pleural effusion, or pneumothorax. No displaced rib fractures. IMPRESSION: No active cardiopulmonary disease. Electronically Signed   By: Rozell Cornet M.D.   On: 08/31/2023 01:20     Radiology No results found.  Procedures Procedures    Medications Ordered in ED Medications  diphenhydrAMINE (BENADRYL) capsule 25 mg (25 mg Oral Given 09/27/23 0348)  ondansetron (ZOFRAN) injection 4 mg (has no administration in time range)  dextrose 5 % and 0.45 % NaCl infusion ( Intravenous New Bag/Given 09/27/23 0355)  HYDROmorphone (DILAUDID) injection 2 mg (2 mg Intravenous Given 09/27/23 0352)  HYDROmorphone (DILAUDID) injection 2 mg (2 mg Intravenous Given 09/27/23 0417)  HYDROmorphone (DILAUDID) injection 2 mg (2 mg Intravenous Given 09/27/23 0508)  ketorolac (TORADOL) 15 MG/ML injection 15 mg (15 mg Intravenous Given 09/27/23 0351)    ED Course/ Medical Decision Making/ A&P                                 Medical Decision Making Patient with sickle cell disease presents with pain crisis secondary to the weather   Amount and/or Complexity of Data Reviewed External Data Reviewed: labs and notes.    Details: Previous visits reviewed  Labs: ordered.    Details: Pregnancy is negative, white count slight elevation 15.4, low hemoglobin 8, elevated platelets.  Normal sodium 135, potassium slight low 3.4, creatinine 0.41 elevated retic count percent 18.2, elevated retic count 474.6   Risk Prescription drug management. Parenteral controlled substances. Decision regarding hospitalization. Risk Details: Patient desires admission     Final Clinical Impression(s) / ED Diagnoses Final diagnoses:  Sickle cell pain crisis (HCC)   The patient appears reasonably stabilized for admission considering the current resources, flow, and  capabilities available in the ED at this time, and I doubt any other [facility] requiring further screening and/or treatment in the ED prior to admission.   Signed out to Dr. Manus Sellers pending admission  Rx / DC Orders ED Discharge Orders     None         Nirav Sweda, MD 09/27/23 [phone]

## 2023-09-27 NOTE — H&P (Signed)
 H&P  Patient Demographics:  Molly Gutierrez, is a 43 y.o. female  MRN: [id number]   DOB - September 30, 1980  Admit Date - 09/27/2023  Outpatient Primary MD for the patient is [name], NP  Chief Complaint  Patient presents with   Sickle Cell Pain Crisis      HPI:   Molly Gutierrez  is a 43 y.o. female with a medical history significant for sickle cell disease, chronic pain syndrome, opiate dependence and tolerance, history of mild intermittent asthma, and history of PE on Xarelto presented to the emergency room with allover body pain over the past 3 days that has been unrelieved by her home medications.  Pain is consistent with her previous sickle cell pain crisis.  She attributes sudden increase in sickle cell pain to changes in weather.  Patient has been attempted to manage crisis at home with oral Dilaudid without success.  She states that pain intensity is 9/10, constant, and throbbing.  Patient denies any sick contacts or recent travel.  No recent falls or injuries.  No dizziness, headache, chest pain, or shortness of breath.  No urinary symptoms, nausea, vomiting, or diarrhea.  ER course: Complete blood count shows WBC slightly elevated at 15.4, WBCs appear to be chronically elevated.  Hemoglobin is 8.0 g/dL, consistent with her baseline.  Platelets 439,000.  Comprehensive metabolic panel unremarkable.  Ms. Murdy pain persists despite IV Dilaudid, IV fluids, and IV Toradol.  Will admit for further management of her sickle cell pain crisis.   Review of systems:  Review of Systems  Constitutional:  Negative for fever.  HENT: Negative.    Eyes: Negative.   Respiratory: Negative.  Negative for shortness of breath.   Cardiovascular:  Negative for chest pain, palpitations and leg swelling.  Gastrointestinal:  Positive for nausea. Negative for abdominal pain, blood in stool, constipation, diarrhea, melena and vomiting.  Musculoskeletal:  Positive for back pain and joint  pain.  Neurological: Negative.  Negative for dizziness, weakness and headaches.  Endo/Heme/Allergies: Negative.   Psychiatric/Behavioral: Negative.      With Past History of the following :   Past Medical History:  Diagnosis Date   Asthma    Eczema    History of pulmonary embolus (PE)    Sickle cell anemia (HCC)       Past Surgical History:  Procedure Laterality Date   CHOLECYSTECTOMY     ERCP     JOINT REPLACEMENT     PORTA CATH INSERTION     TUBAL LIGATION     WISDOM TOOTH EXTRACTION       Social History:   Social History   Tobacco Use   Smoking status: Never   Smokeless tobacco: Never  Substance Use Topics   Alcohol use: Never     Lives - At home   Family History :   Family History  Problem Relation Age of Onset   Renal Disease Mother    Hypertension Mother    High Cholesterol Mother    Heart attack Mother    Diabetes Brother      Home Medications:   Prior to Admission medications   Medication Sig Start Date End Date Taking? Authorizing Provider  acetaminophen (TYLENOL) 500 MG tablet Take 500 mg by mouth every 6 (six) hours as needed for mild pain or headache (or cramps).   Yes [provider]  albuterol (VENTOLIN HFA) 108 (90 Base) MCG/ACT inhaler TAKE 2 PUFFS BY MOUTH EVERY 6 HOURS AS NEEDED FOR WHEEZE OR SHORTNESS  OF BREATH Patient taking differently: Inhale 2 puffs into the lungs every 6 (six) hours as needed for wheezing or shortness of breath. 01/22/23  Yes Jerrlyn Morel, NP  budesonide-formoterol (SYMBICORT) 80-4.5 MCG/ACT inhaler INHALE 2 PUFFS INTO THE LUNGS TWICE A DAY Patient taking differently: Inhale 2 puffs into the lungs 2 (two) times daily as needed. 01/27/23  Yes Nichols, Tonya S, NP  Cholecalciferol (VITAMIN D3 PO) Take 1 capsule by mouth at bedtime.   Yes [provider]  Cyanocobalamin (VITAMIN B-12 PO) Take 1 tablet by mouth at bedtime.   Yes [provider]  Deferiprone (FERRIPROX) 500 MG TABS Take 500 mg  by mouth 2 (two) times daily. Patient taking differently: Take 1,000 mg by mouth daily. 05/14/23  Yes Sigurd Driver, FNP  diphenhydrAMINE (BENADRYL) 25 MG tablet Take 25 mg by mouth See admin instructions. Take 25 mg by mouth every four to six hours with each dose of Hydromorphone   Yes [provider]  folic acid (FOLVITE) 1 MG tablet Take 1 tablet (1 mg total) by mouth daily. 12/23/22  Yes Nichols, Tonya S, NP  HYDROmorphone (DILAUDID) 4 MG tablet Take 1 tablet (4 mg total) by mouth every 6 (six) hours as needed for up to 15 days for severe pain (pain score 7-10). 09/17/23 10/03/23 Yes Jerrlyn Morel, NP  mirtazapine (REMERON) 45 MG tablet Take 1 tablet (45 mg total) by mouth at bedtime. 12/23/22 12/23/23 Yes Jerrlyn Morel, NP  naloxone [facility]) nasal spray 4 mg/0.1 mL Place 0.1 sprays (0.4 mg total) into the nose once as needed (opioid overdose). 12/23/22  Yes Jerrlyn Morel, NP  polyethylene glycol (MIRALAX / GLYCOLAX) 17 g packet Take 17 g by mouth daily as needed for mild constipation (MIX AS DIRECTED AND DRINK). 12/23/22  Yes Jerrlyn Morel, NP  promethazine (PHENERGAN) 25 MG tablet Take 0.5-1 tablets (12.5-25 mg total) by mouth every 6 (six) hours as needed for nausea or vomiting. 08/06/23 08/05/24 Yes Nichols, Tonya S, NP  Vitamin D, Ergocalciferol, (DRISDOL) 1.25 MG (50000 UNIT) CAPS capsule Take 1 capsule (50,000 Units total) by mouth once a week. Patient taking differently: Take 50,000 Units by mouth every Wednesday. 12/23/22 12/23/23 Yes Jerrlyn Morel, NP  XARELTO 20 MG TABS tablet Take 1 tablet (20 mg total) by mouth daily with supper. 09/17/23 09/16/24 Yes Nichols, Tonya S, NP  fluticasone (FLONASE) 50 MCG/ACT nasal spray Place 1 spray into both nostrils daily as needed for allergies or rhinitis. 12/31/21 05/05/23  Jerrlyn Morel, NP     Allergies:   Allergies  Allergen Reactions   Cefaclor Hives, Swelling and Rash   Hydroxyurea Palpitations and Other (See Comments)    Lowers  "blood levels" and heart rate (causes HYPOtension); "it messes me up, it drops my levels and stuff" and Hypotension    Omeprazole Other (See Comments)    Causes "sharp pains in the stomach"   Ketamine Palpitations and Other (See Comments)    "Pt states she has had previous reaction to ketamine. States she becomes flushed, heart races, dizzy, and feels like she is going to pass out."     Physical Exam:   Vitals:   Vitals:   09/27/23 1229 09/27/23 1319  BP: 127/82   Pulse: 73   Resp: 14 14  Temp: 98.5 F (36.9 C)   SpO2: 97% 97%    Physical Exam: Constitutional: Patient appears well-developed and well-nourished. Not in obvious distress. HENT: Normocephalic, atraumatic, External right and  left ear normal. Oropharynx is clear and moist.  Eyes: Conjunctivae and EOM are normal. PERRLA, no scleral icterus. Neck: Normal ROM. Neck supple. No JVD. No tracheal deviation. No thyromegaly. CVS: RRR, S1/S2 +, no murmurs, no gallops, no carotid bruit.  Pulmonary: Effort and breath sounds normal, no stridor, rhonchi, wheezes, rales.  Abdominal: Soft. BS +, no distension, tenderness, rebound or guarding.  Musculoskeletal: Normal range of motion. No edema and no tenderness.  Lymphadenopathy: No lymphadenopathy noted, cervical, inguinal or axillary Neuro: Alert. Normal reflexes, muscle tone coordination. No cranial nerve deficit. Skin: Skin is warm and dry. No rash noted. Not diaphoretic. No erythema. No pallor. Psychiatric: Normal mood and affect. Behavior, judgment, thought content normal.   Data Review:   CBC Recent Labs  Lab 09/27/23 0321  WBC 15.4*  HGB 8.0*  HCT 23.2*  PLT 439*  MCV 92.1  MCH 31.7  MCHC 34.5  RDW 19.9*  LYMPHSABS 2.8  MONOABS 2.1*  EOSABS 0.4  BASOSABS 0.1   ------------------------------------------------------------------------------------------------------------------  Chemistries  Recent Labs  Lab 09/27/23 0321  NA 135  K 3.4*  CL 105  CO2 23   GLUCOSE 95  BUN 9  CREATININE 0.41*  CALCIUM 8.3*  AST 44*  ALT 14  ALKPHOS 45  BILITOT 2.5*   ------------------------------------------------------------------------------------------------------------------ estimated creatinine clearance is 75.8 mL/min (A) (by C-G formula based on SCr of 0.41 mg/dL (L)). ------------------------------------------------------------------------------------------------------------------ No results for input(s): "TSH", "T4TOTAL", "T3FREE", "THYROIDAB" in the last 72 hours.  Invalid input(s): "FREET3"  Coagulation profile No results for input(s): "INR", "PROTIME" in the last 168 hours. ------------------------------------------------------------------------------------------------------------------- No results for input(s): "DDIMER" in the last 72 hours. -------------------------------------------------------------------------------------------------------------------  Cardiac Enzymes No results for input(s): "CKMB", "TROPONINI", "MYOGLOBIN" in the last 168 hours.  Invalid input(s): "CK" ------------------------------------------------------------------------------------------------------------------    Component Value Date/Time   BNP 70.6 05/05/2019 0204    ---------------------------------------------------------------------------------------------------------------  Urinalysis    Component Value Date/Time   COLORURINE YELLOW 05/06/2023 0026   APPEARANCEUR CLEAR 05/06/2023 0026   LABSPEC 1.014 05/06/2023 0026   PHURINE 5.0 05/06/2023 0026   GLUCOSEU NEGATIVE 05/06/2023 0026   HGBUR NEGATIVE 05/06/2023 0026   BILIRUBINUR NEGATIVE 05/06/2023 0026   BILIRUBINUR neg 08/03/2020 1429   KETONESUR NEGATIVE 05/06/2023 0026   PROTEINUR NEGATIVE 05/06/2023 0026   UROBILINOGEN 1.0 08/03/2020 1429   NITRITE NEGATIVE 05/06/2023 0026   LEUKOCYTESUR SMALL (A) 05/06/2023 0026     ----------------------------------------------------------------------------------------------------------------   Imaging Results:    No results found.   Assessment & Plan:  Principal Problem:   Sickle cell crisis (HCC)  Sickle cell disease with pain crisis: Admit patient.  Initiate IV Dilaudid PCA with settings of 0.5 mg, 10-minute lockout, and 3 mg/h. Toradol 15 mg IV every 6 hours for total of 5 days. Will hold home Dilaudid. Supplemental oxygen as needed. Restart folic acid 1 mg daily. Monitor vital signs very closely and reevaluate pain scale regularly. Patient will be reevaluated for further pain in the context of function in relationship to baseline as care progresses.  Anemia of chronic disease: Patient's hemoglobin is stable and consistent with her baseline.  There is no clinical indication for blood transfusion at this time.  Monitor closely.  Will follow labs in AM.  Chronic pain syndrome: Will hold home Dilaudid.  IV Dilaudid PCA initiated.  Leukocytosis: WBCs chronically elevated.  No signs of acute infection.  Will continue to monitor closely.  History of PE: Continue Xarelto  History of mild intermittent asthma: Stable.  Continue home medications as needed.  DVT  Prophylaxis: Subcut Lovenox   AM Labs Ordered, also please review Full Orders  Family Communication: Admission, patient's condition and plan of care including tests being ordered have been discussed with the patient who indicate understanding and agree with the plan and Code Status.  Code Status: Full Code  Consults called: None    Admission status: Inpatient    Time spent in minutes : 50 minutes   Corky Diener  DNP, APRN, FNP-C Patient Care [facility] [address] [place], [place] [address] [phone]  09/27/2023 at 1:41 PM

## 2023-09-27 NOTE — ED Triage Notes (Signed)
 Patient c/o sickle cell crisis pain x 1 day. Patient report taking PRN pain at home without relief. Patient c/o nausea , denies vomiting.

## 2023-09-27 NOTE — ED Notes (Signed)
 Pt complaints of severe itching

## 2023-09-28 DIAGNOSIS — D5700 Hb-SS disease with crisis, unspecified: Secondary | ICD-10-CM | POA: Diagnosis not present

## 2023-09-28 LAB — CBC
HCT: 19.9 % — ABNORMAL LOW (ref 36.0–46.0)
Hemoglobin: 6.9 g/dL — CL (ref 12.0–15.0)
MCH: 31.8 pg (ref 26.0–34.0)
MCHC: 34.7 g/dL (ref 30.0–36.0)
MCV: 91.7 fL (ref 80.0–100.0)
Platelets: 372 10*3/uL (ref 150–400)
RBC: 2.17 MIL/uL — ABNORMAL LOW (ref 3.87–5.11)
RDW: 19.9 % — ABNORMAL HIGH (ref 11.5–15.5)
WBC: 12.9 10*3/uL — ABNORMAL HIGH (ref 4.0–10.5)
nRBC: 0.4 % — ABNORMAL HIGH (ref 0.0–0.2)

## 2023-09-28 LAB — BASIC METABOLIC PANEL WITH GFR
Anion gap: 4 — ABNORMAL LOW (ref 5–15)
BUN: 14 mg/dL (ref 6–20)
CO2: 25 mmol/L (ref 22–32)
Calcium: 8.3 mg/dL — ABNORMAL LOW (ref 8.9–10.3)
Chloride: 107 mmol/L (ref 98–111)
Creatinine, Ser: 0.64 mg/dL (ref 0.44–1.00)
GFR, Estimated: >60 mL/min (ref >60)
Glucose, Bld: 101 mg/dL — ABNORMAL HIGH (ref 70–99)
Potassium: 3.6 mmol/L (ref 3.5–5.1)
Sodium: 136 mmol/L (ref 135–145)

## 2023-09-28 LAB — HEMOGLOBIN AND HEMATOCRIT, BLOOD
HCT: 23.8 % — ABNORMAL LOW (ref 36.0–46.0)
Hemoglobin: 8.5 g/dL — ABNORMAL LOW (ref 12.0–15.0)

## 2023-09-28 LAB — PREPARE RBC (CROSSMATCH)

## 2023-09-28 MED ORDER — SODIUM CHLORIDE 0.9% IV SOLUTION: Freq: Once | INTRAVENOUS | Status: AC

## 2023-09-28 MED ORDER — DIPHENHYDRAMINE HCL 50 MG/ML IJ SOLN
25.0000 mg | Freq: Once | INTRAMUSCULAR | Status: AC
  Administered 2023-09-28: 25 mg via INTRAVENOUS
  Filled 2023-09-28: qty 1

## 2023-09-28 MED ORDER — ACETAMINOPHEN 325 MG PO TABS
650.0000 mg | ORAL_TABLET | Freq: Once | ORAL | Status: AC
  Administered 2023-09-28: 650 mg via ORAL
  Filled 2023-09-28: qty 2

## 2023-09-28 MED ORDER — SODIUM CHLORIDE 0.9% FLUSH: 10.0000 mL | INTRAVENOUS | Status: DC | PRN

## 2023-09-28 MED ORDER — HEPARIN SOD (PORK) LOCK FLUSH 100 UNIT/ML IV SOLN: 500.0000 [IU] | INTRAVENOUS | Status: DC | PRN

## 2023-09-28 NOTE — Progress Notes (Signed)
 Subjective: Molly Gutierrez  is a 43 year old female with a medical history significant for sickle cell disease, chronic pain syndrome, opiate dependence and tolerance, history of mild intermittent asthma, and history of PE on Xarelto that was admitted for sickle cell pain crisis. Overnight, hemoglobin is decreased below her baseline at 6.8 g/dL.  Patient denies any shortness of breath, headache, chest pain, or paresthesias. She continues to have pain primarily to upper and lower extremities, and low back.  Patient rates her pain as 8/10.  Objective:  Vital signs in last 24 hours:  Vitals:   09/28/23 0803 09/28/23 1043 09/28/23 1045 09/28/23 1124  BP:  124/73 124/73 101/70  Pulse:  85 80 71  Resp: 13 16 16 16   Temp:  98.7 F (37.1 C) 98.7 F (37.1 C) 98.9 F (37.2 C)  TempSrc:  Oral Oral Oral  SpO2:  94% 94% 96%  Weight:      Height:        Intake/Output from previous day:   Intake/Output Summary (Last 24 hours) at 09/28/2023 1150 Last data filed at 09/28/2023 1105 Gross per 24 hour  Intake 1272.69 ml  Output --  Net 1272.69 ml    Physical Exam: General: Alert, awake, oriented x3, in no acute distress.  HEENT: [place]/AT PEERL, EOMI Neck: Trachea midline,  no masses, no thyromegal,y no JVD, no carotid bruit OROPHARYNX:  Moist, No exudate/ erythema/lesions.  Heart: Regular rate and rhythm, without murmurs, rubs, gallops, PMI non-displaced, no heaves or thrills on palpation.  Lungs: Clear to auscultation, no wheezing or rhonchi noted. No increased vocal fremitus resonant to percussion  Abdomen: Soft, nontender, nondistended, positive bowel sounds, no masses no hepatosplenomegaly noted..  Neuro: No focal neurological deficits noted cranial nerves II through XII grossly intact. DTRs 2+ bilaterally upper and lower extremities. Strength 5 out of 5 in bilateral upper and lower extremities. Musculoskeletal: No warm swelling or erythema around joints, no spinal tenderness  noted. Psychiatric: Patient alert and oriented x3, good insight and cognition, good recent to remote recall. Lymph node survey: No cervical axillary or inguinal lymphadenopathy noted.  Lab Results:  Basic Metabolic Panel:    Component Value Date/Time   NA 136 09/28/2023 0557   NA 137 12/23/2022 1210   K 3.6 09/28/2023 0557   CL 107 09/28/2023 0557   CO2 25 09/28/2023 0557   BUN 14 09/28/2023 0557   BUN 5 (L) 12/23/2022 1210   CREATININE 0.64 09/28/2023 0557   GLUCOSE 101 (H) 09/28/2023 0557   CALCIUM 8.3 (L) 09/28/2023 0557   CBC:    Component Value Date/Time   WBC 12.9 (H) 09/28/2023 0557   HGB 6.9 (LL) 09/28/2023 0557   HGB 7.9 (L) 12/23/2022 1210   HCT 19.9 (L) 09/28/2023 0557   HCT 24.4 (L) 12/23/2022 1210   PLT 372 09/28/2023 0557   PLT 554 (H) 12/23/2022 1210   MCV 91.7 09/28/2023 0557   MCV 94 12/23/2022 1210   NEUTROABS 9.9 (H) 09/27/2023 0321   NEUTROABS 10.4 (H) 12/23/2022 1210   LYMPHSABS 2.8 09/27/2023 0321   LYMPHSABS 2.9 12/23/2022 1210   MONOABS 2.1 (H) 09/27/2023 0321   EOSABS 0.4 09/27/2023 0321   EOSABS 0.2 12/23/2022 1210   BASOSABS 0.1 09/27/2023 0321   BASOSABS 0.1 12/23/2022 1210    No results found for this or any previous visit (from the past 240 hours).  Studies/Results: No results found.  Medications: Scheduled Meds:  cholecalciferol  1,000 Units Oral Daily   Deferiprone  1,000 mg Oral Daily  folic acid  1 mg Oral Daily   HYDROmorphone   Intravenous Q4H   ketorolac  15 mg Intravenous Q6H   mirtazapine  45 mg Oral QHS   rivaroxaban  20 mg Oral Q supper   senna-docusate  1 tablet Oral BID   Continuous Infusions: PRN Meds:.albuterol, diphenhydrAMINE, heparin lock flush, naloxone **AND** sodium chloride flush, ondansetron, mouth rinse, polyethylene glycol, sodium chloride flush  Consultants: none  Procedures: none  Antibiotics: none  Assessment/Plan: Principal Problem:   Sickle cell crisis (HCC) Sickle cell disease with  pain crisis: Continue IV Dilaudid PCA without any changes Toradol 15 mg IV every 6 hours for total of 5 days Hold home Dilaudid, will restart as pain intensity improves Discontinue IV fluids Monitor vital signs very closely, reevaluate pain scale regularly, and supplemental oxygen as needed.  Anemia of chronic disease: Patient's hemoglobin is 6.8 g/dL.  Transfuse 1 unit PRBCs.  Follow labs in AM.  Leukocytosis: Stable.  No signs of acute infection.  Continue to monitor closely.  History of mild intermittent asthma: Stable.  Continue home medications as needed.  History of PE: Continue Xarelto  Chronic pain syndrome: Hold home Dilaudid.  Continue IV Dilaudid PCA.  Code Status: Full Code Family Communication: N/A Disposition Plan: Not yet ready for discharge.  Discharge anticipated 09/29/2023.  Corky Diener  DNP, APRN, FNP-C Patient Care [facility] [address] [place], [place] [address] [phone]  If 7PM-7AM, please contact night-coverage.  09/28/2023, 11:50 AM  LOS: 1 day

## 2023-09-28 NOTE — Plan of Care (Signed)
  Problem: Education: Goal: Knowledge of General Education information will improve Description: Including pain rating scale, medication(s)/side effects and non-pharmacologic comfort measures Outcome: Progressing   Problem: Clinical Measurements: Goal: Ability to maintain clinical measurements within normal limits will improve Outcome: Progressing Goal: Will remain free from infection Outcome: Progressing Goal: Diagnostic test results will improve Outcome: Progressing Goal: Respiratory complications will improve Outcome: Progressing   Problem: Activity: Goal: Risk for activity intolerance will decrease Outcome: Progressing   Problem: Nutrition: Goal: Adequate nutrition will be maintained Outcome: Progressing   Problem: Pain Managment: Goal: General experience of comfort will improve and/or be controlled Outcome: Progressing   Problem: Safety: Goal: Ability to remain free from injury will improve Outcome: Progressing   Problem: Skin Integrity: Goal: Risk for impaired skin integrity will decrease Outcome: Progressing   Problem: Education: Goal: Awareness of signs and symptoms of anemia will improve Outcome: Progressing Goal: Long-term complications will improve Outcome: Progressing   Problem: Self-Care: Goal: Ability to incorporate actions that prevent/reduce pain crisis will improve Outcome: Progressing   Problem: Tissue Perfusion: Goal: Complications related to inadequate tissue perfusion will be avoided or minimized Outcome: Progressing   Problem: Respiratory: Goal: Pulmonary complications will be avoided or minimized Outcome: Progressing Goal: Acute Chest Syndrome will be identified early to prevent complications Outcome: Progressing   Problem: Sensory: Goal: Pain level will decrease with appropriate interventions Outcome: Progressing

## 2023-09-28 NOTE — Plan of Care (Signed)

## 2023-09-29 LAB — TYPE AND SCREEN
ABO/RH(D): A POS
Antibody Screen: NEGATIVE
Unit division: 0

## 2023-09-29 LAB — CBC
HCT: 24.4 % — ABNORMAL LOW (ref 36.0–46.0)
Hemoglobin: 8.3 g/dL — ABNORMAL LOW (ref 12.0–15.0)
MCH: 31.2 pg (ref 26.0–34.0)
MCHC: 34 g/dL (ref 30.0–36.0)
MCV: 91.7 fL (ref 80.0–100.0)
Platelets: 388 10*3/uL (ref 150–400)
RBC: 2.66 MIL/uL — ABNORMAL LOW (ref 3.87–5.11)
RDW: 19.6 % — ABNORMAL HIGH (ref 11.5–15.5)
WBC: 15.6 10*3/uL — ABNORMAL HIGH (ref 4.0–10.5)
nRBC: 0.3 % — ABNORMAL HIGH (ref 0.0–0.2)

## 2023-09-29 LAB — BPAM RBC
Blood Product Expiration Date: 202505122359
ISSUE DATE / TIME: 202504131050
Unit Type and Rh: 9500

## 2023-09-29 MED ORDER — DIPHENHYDRAMINE HCL 50 MG/ML IJ SOLN
12.5000 mg | Freq: Once | INTRAMUSCULAR | Status: AC
  Administered 2023-09-29: 12.5 mg via INTRAVENOUS
  Filled 2023-09-29: qty 1

## 2023-09-29 NOTE — Progress Notes (Signed)
 Patient ID: Molly Gutierrez, female   DOB: 1980/11/25, 43 y.o.   MRN: [id number]  Subjective: Molly Gutierrez is a 43 year old female with a medical history significant for sickle cell disease, chronic pain syndrome, opiate dependence and tolerance, history of mild intermittent asthma, and history of PE on Xarelto that was admitted for sickle cell pain crisis. Overnight, hemoglobin is decreased below her baseline at 6.8 g/dL.  Patient denies any shortness of breath, headache, chest pain, or paresthesias. Patient continues to have pain primarily to upper and lower extremities, and low back.  Patient rates her pain as 8/10 with no improvement from yesterday.    Objective:  Vital signs in last 24 hours:  Vitals:   09/29/23 0518 09/29/23 0746 09/29/23 1032 09/29/23 1133  BP:   118/79   Pulse:   85   Resp: 14 11 14 14   Temp:   99 F (37.2 C)   TempSrc:   Oral   SpO2:   96%   Weight:      Height:        Intake/Output from previous day:   Intake/Output Summary (Last 24 hours) at 09/29/2023 1355 Last data filed at 09/29/2023 0925 Gross per 24 hour  Intake 1100.5 ml  Output --  Net 1100.5 ml    Physical Exam: General: Alert, awake, oriented x3, in no acute distress.  HEENT: Molly Gutierrez/AT [place], EOMI Neck: Trachea midline,  no masses, no thyromegal,y no JVD, no carotid bruit OROPHARYNX:  Moist, No exudate/ erythema/lesions.  Heart: Regular rate and rhythm, without murmurs, rubs, gallops, PMI non-displaced, no heaves or thrills on palpation.  Lungs: Clear to auscultation, no wheezing or rhonchi noted. No increased vocal fremitus resonant to percussion  Abdomen: Soft, nontender, nondistended, positive bowel sounds, no masses no hepatosplenomegaly noted..  Neuro: No focal neurological deficits noted cranial nerves II through XII grossly intact. DTRs 2+ bilaterally upper and lower extremities. Strength 5 out of 5 in bilateral upper and lower extremities. Musculoskeletal: Upper and lower  extremity pain  Psychiatric: Patient alert and oriented x3, good insight and cognition, good recent to remote recall. Lymph node survey: No cervical axillary or inguinal lymphadenopathy noted.  Lab Results:  Basic Metabolic Panel:    Component Value Date/Time   NA 136 09/28/2023 0557   NA 137 12/23/2022 1210   K 3.6 09/28/2023 0557   CL 107 09/28/2023 0557   CO2 25 09/28/2023 0557   BUN 14 09/28/2023 0557   BUN 5 (L) 12/23/2022 1210   CREATININE 0.64 09/28/2023 0557   GLUCOSE 101 (H) 09/28/2023 0557   CALCIUM 8.3 (L) 09/28/2023 0557   CBC:    Component Value Date/Time   WBC 15.6 (H) 09/29/2023 0607   HGB 8.3 (L) 09/29/2023 0607   HGB 7.9 (L) 12/23/2022 1210   HCT 24.4 (L) 09/29/2023 0607   HCT 24.4 (L) 12/23/2022 1210   PLT 388 09/29/2023 0607   PLT 554 (H) 12/23/2022 1210   MCV 91.7 09/29/2023 0607   MCV 94 12/23/2022 1210   NEUTROABS 9.9 (H) 09/27/2023 0321   NEUTROABS 10.4 (H) 12/23/2022 1210   LYMPHSABS 2.8 09/27/2023 0321   LYMPHSABS 2.9 12/23/2022 1210   MONOABS 2.1 (H) 09/27/2023 0321   EOSABS 0.4 09/27/2023 0321   EOSABS 0.2 12/23/2022 1210   BASOSABS 0.1 09/27/2023 0321   BASOSABS 0.1 12/23/2022 1210    No results found for this or any previous visit (from the past 240 hours).  Studies/Results: No results found.  Medications: Scheduled Meds:  cholecalciferol  1,000 Units Oral Daily   Deferiprone  1,000 mg Oral Daily   folic acid  1 mg Oral Daily   HYDROmorphone   Intravenous Q4H   ketorolac  15 mg Intravenous Q6H   mirtazapine  45 mg Oral QHS   rivaroxaban  20 mg Oral Q supper   senna-docusate  1 tablet Oral BID   Continuous Infusions: PRN Meds:.albuterol, diphenhydrAMINE, heparin lock flush, naloxone **AND** sodium chloride flush, ondansetron, mouth rinse, polyethylene glycol, sodium chloride flush  Consultants: None  Procedures: None  Antibiotics: None  Assessment/Plan: Principal Problem:   Sickle cell crisis (HCC)   Hb Sickle  Cell Disease with Pain crisis: Continue IVF 0.45% Saline @ KVO mls/hour, continue weight based Dilaudid PCA, IV Toradol 15 mg Q 6 H for a total of 5 days, continue oral home pain medications as ordered. Monitor vitals very closely, Re-evaluate pain scale regularly, 2 L of Oxygen by [place]. Patient encouraged to ambulate on the hallway today.  Leukocytosis: No signs of acute infection. Continue to monitor closely.  Anemia of Chronic Disease: Patient's hemoglobin is 8.3 g/dL. After transfusion. Will continue to monitor. Daily CBC in place  Chronic pain Syndrome: Continue oral home medication  Leukocytosis: Stable.  No signs of acute infection.  Continue to monitor closely.    Code Status: Full Code Family Communication: N/A Disposition Plan: Not yet ready for discharge  Lorel Roes NP  If 7PM-7AM, please contact night-coverage.  09/29/2023, 1:55 PM  LOS: 2 days

## 2023-09-29 NOTE — Plan of Care (Signed)

## 2023-09-30 MED ORDER — OXYCODONE HCL 5 MG PO TABS: 10.0000 mg | ORAL_TABLET | ORAL | Status: DC | PRN

## 2023-09-30 NOTE — Progress Notes (Signed)
 Patient ID: [name], female   DOB: [birth year], 43 y.o.   MRN: [id number] Subjective: Kimarie Coor is a 43 year old female with a medical history significant for sickle cell disease, chronic pain syndrome, opiate dependence and tolerance, history of mild intermittent asthma, and history of PE on Xarelto that was admitted for sickle cell pain crisis. Overnight, hemoglobin is decreased below her baseline at 6.8 g/dL.  Patient denies any shortness of breath, headache, chest pain, or paresthesias. Patient continues to have pain primarily to upper and lower extremities, and low back.  Patient rates her pain today as 7/10 with slight improvement from yesterday.    Objective:  Vital signs in last 24 hours:  Vitals:   09/30/23 2300 10/01/23 0512 10/01/23 0612 10/01/23 0708  BP:   106/64   Pulse:   76   Resp: 12 11 18 12   Temp:   99 F (37.2 C)   TempSrc:   Oral   SpO2: 95% 94% 95%   Weight:      Height:        Intake/Output from previous day:  No intake or output data in the 24 hours ending 10/01/23 2153   Physical Exam: General: Alert, awake, oriented x3, in no acute distress.  HEENT: [place]/AT PEERL, EOMI Neck: Trachea midline,  no masses, no thyromegal,y no JVD, no carotid bruit OROPHARYNX:  Moist, No exudate/ erythema/lesions.  Heart: Regular rate and rhythm, without murmurs, rubs, gallops, PMI non-displaced, no heaves or thrills on palpation.  Lungs: Clear to auscultation, no wheezing or rhonchi noted. No increased vocal fremitus resonant to percussion  Abdomen: Soft, nontender, nondistended, positive bowel sounds, no masses no hepatosplenomegaly noted..  Neuro: No focal neurological deficits noted cranial nerves II through XII grossly intact. DTRs 2+ bilaterally upper and lower extremities. Strength 5 out of 5 in bilateral upper and lower extremities. Musculoskeletal: Lower extremity pain  Psychiatric: Patient alert and oriented x3, good insight and cognition, good  recent to remote recall. Lymph node survey: No cervical axillary or inguinal lymphadenopathy noted.  Lab Results:  Basic Metabolic Panel:    Component Value Date/Time   NA 136 09/28/2023 0557   NA 137 12/23/2022 1210   K 3.6 09/28/2023 0557   CL 107 09/28/2023 0557   CO2 25 09/28/2023 0557   BUN 14 09/28/2023 0557   BUN 5 (L) 12/23/2022 1210   CREATININE 0.64 09/28/2023 0557   GLUCOSE 101 (H) 09/28/2023 0557   CALCIUM 8.3 (L) 09/28/2023 0557   CBC:    Component Value Date/Time   WBC 15.6 (H) 09/29/2023 0607   HGB 8.3 (L) 09/29/2023 0607   HGB 7.9 (L) 12/23/2022 1210   HCT 24.4 (L) 09/29/2023 0607   HCT 24.4 (L) 12/23/2022 1210   PLT 388 09/29/2023 0607   PLT 554 (H) 12/23/2022 1210   MCV 91.7 09/29/2023 0607   MCV 94 12/23/2022 1210   NEUTROABS 9.9 (H) 09/27/2023 0321   NEUTROABS 10.4 (H) 12/23/2022 1210   LYMPHSABS 2.8 09/27/2023 0321   LYMPHSABS 2.9 12/23/2022 1210   MONOABS 2.1 (H) 09/27/2023 0321   EOSABS 0.4 09/27/2023 0321   EOSABS 0.2 12/23/2022 1210   BASOSABS 0.1 09/27/2023 0321   BASOSABS 0.1 12/23/2022 1210    No results found for this or any previous visit (from the past 240 hours).  Studies/Results: No results found.  Medications: Scheduled Meds:   Continuous Infusions: PRN Meds:.  Consultants: None  Procedures: None  Antibiotics: None  Assessment/Plan: Principal Problem:   Sickle cell crisis (  HCC)   Hb Sickle Cell Disease with Pain crisis: Continue IVF 0.45% Saline @ KVO mls/hour, continue weight based Dilaudid PCA, IV Toradol 15 mg Q 6 H for a total of 5 days, continue oral home pain medications as ordered. Monitor vitals very closely, Re-evaluate pain scale regularly, 2 L of Oxygen by [place]. Patient encouraged to ambulate on the hallway today.  Leukocytosis: No signs of acute infection. Continue to monitor closely.  Anemia of Chronic Disease:  Patient's hemoglobin is 8.3 g/dL. After transfusion. Will continue to monitor. Daily CBC in  place   Chronic pain Syndrome:  Continue oral home medication    Code Status: Full Code Family Communication: N/A Disposition Plan: Not yet ready for discharge  Lorel Roes NP  If 7PM-7AM, please contact night-coverage.  10/01/2023, 9:53 PM  LOS: 4 days

## 2023-09-30 NOTE — Plan of Care (Signed)

## 2023-10-01 ENCOUNTER — Other Ambulatory Visit: Payer: Self-pay | Admitting: Nurse Practitioner

## 2023-10-01 ENCOUNTER — Other Ambulatory Visit (HOSPITAL_COMMUNITY): Payer: Self-pay

## 2023-10-01 DIAGNOSIS — D5700 Hb-SS disease with crisis, unspecified: Secondary | ICD-10-CM

## 2023-10-01 MED ORDER — HYDROMORPHONE HCL 4 MG PO TABS
4.0000 mg | ORAL_TABLET | Freq: Four times a day (QID) | ORAL | 0 refills | Status: AC | PRN
Start: 2023-10-01 — End: 2023-10-16
  Filled 2023-10-01 (×2): qty 60, 15d supply, fill #0

## 2023-10-01 MED ORDER — HEPARIN SOD (PORK) LOCK FLUSH 100 UNIT/ML IV SOLN
500.0000 [IU] | Freq: Once | INTRAVENOUS | Status: AC
  Administered 2023-10-01: 500 [IU] via INTRAVENOUS
  Filled 2023-10-01: qty 5

## 2023-10-01 NOTE — Plan of Care (Addendum)
 Patient is alert and oriented X4, all discharge information went over with pt. Pt educated on pain regimen and future appointments. PIV removed, and chest port deaccessed. No further needs. Patient will wait for ride in discharge lounge.  Problem: Education: Goal: Knowledge of General Education information will improve Description: Including pain rating scale, medication(s)/side effects and non-pharmacologic comfort measures Outcome: Completed/Met   Problem: Health Behavior/Discharge Planning: Goal: Ability to manage health-related needs will improve Outcome: Completed/Met   Problem: Clinical Measurements: Goal: Ability to maintain clinical measurements within normal limits will improve Outcome: Completed/Met Goal: Will remain free from infection Outcome: Completed/Met Goal: Diagnostic test results will improve Outcome: Completed/Met Goal: Respiratory complications will improve Outcome: Completed/Met Goal: Cardiovascular complication will be avoided Outcome: Completed/Met   Problem: Activity: Goal: Risk for activity intolerance will decrease Outcome: Completed/Met   Problem: Nutrition: Goal: Adequate nutrition will be maintained Outcome: Completed/Met   Problem: Coping: Goal: Level of anxiety will decrease Outcome: Completed/Met   Problem: Elimination: Goal: Will not experience complications related to bowel motility Outcome: Completed/Met Goal: Will not experience complications related to urinary retention Outcome: Completed/Met   Problem: Pain Managment: Goal: General experience of comfort will improve and/or be controlled Outcome: Completed/Met   Problem: Safety: Goal: Ability to remain free from injury will improve Outcome: Completed/Met   Problem: Skin Integrity: Goal: Risk for impaired skin integrity will decrease Outcome: Completed/Met   Problem: Education: Goal: Knowledge of vaso-occlusive preventative measures will improve Outcome: Completed/Met Goal:  Awareness of infection prevention will improve Outcome: Completed/Met Goal: Awareness of signs and symptoms of anemia will improve Outcome: Completed/Met Goal: Long-term complications will improve Outcome: Completed/Met   Problem: Self-Care: Goal: Ability to incorporate actions that prevent/reduce pain crisis will improve Outcome: Completed/Met   Problem: Bowel/Gastric: Goal: Gut motility will be maintained Outcome: Completed/Met   Problem: Tissue Perfusion: Goal: Complications related to inadequate tissue perfusion will be avoided or minimized Outcome: Completed/Met   Problem: Respiratory: Goal: Pulmonary complications will be avoided or minimized Outcome: Completed/Met Goal: Acute Chest Syndrome will be identified early to prevent complications Outcome: Completed/Met   Problem: Fluid Volume: Goal: Ability to maintain a balanced intake and output will improve Outcome: Completed/Met   Problem: Sensory: Goal: Pain level will decrease with appropriate interventions Outcome: Completed/Met   Problem: Health Behavior: Goal: Postive changes in compliance with treatment and prescription regimens will improve Outcome: Completed/Met

## 2023-10-01 NOTE — Plan of Care (Signed)
  Problem: Education: Goal: Knowledge of General Education information will improve Description: Including pain rating scale, medication(s)/side effects and non-pharmacologic comfort measures Outcome: Progressing   Problem: Activity: Goal: Risk for activity intolerance will decrease Outcome: Progressing   Problem: Elimination: Goal: Will not experience complications related to urinary retention Outcome: Progressing   Problem: Pain Managment: Goal: General experience of comfort will improve and/or be controlled Outcome: Progressing   Problem: Safety: Goal: Ability to remain free from injury will improve Outcome: Progressing   Problem: Skin Integrity: Goal: Risk for impaired skin integrity will decrease Outcome: Progressing

## 2023-10-01 NOTE — Discharge Summary (Signed)
 Physician Discharge Summary  [facility] NWG:[id number] DOB: 05-20-1981 DOA: 09/27/2023  PCP: [name], NP  Admit date: 09/27/2023  Discharge date: 10/01/2023  Discharge Diagnoses:  Principal Problem:   Sickle cell crisis [facility])   Discharge Condition: Stable  Disposition:  Pt is discharged home in good condition and is to follow up with [name], NP this week to have labs evaluated. Molly Gutierrez is instructed to increase activity slowly and balance with rest for the next few days, and use prescribed medication to complete treatment of pain  Diet: Regular Wt Readings from Last 3 Encounters:  09/27/23 56 kg  08/31/23 56.4 kg  08/06/23 54.4 kg    History of present illness:  Molly Gutierrez  is a 43 y.o. female with a medical history significant for sickle cell disease, chronic pain syndrome, opiate dependence and tolerance, history of mild intermittent asthma, and history of PE on Xarelto presented to the emergency room with allover body pain over the past 3 days that has been unrelieved by her home medications.  Pain is consistent with her previous sickle cell pain crisis.  She attributes sudden increase in sickle cell pain to changes in weather.  Patient has been attempted to manage crisis at home with oral Dilaudid without success.  She states that pain intensity is 9/10, constant, and throbbing.  Patient denies any sick contacts or recent travel.  No recent falls or injuries.  No dizziness, headache, chest pain, or shortness of breath.  No urinary symptoms, nausea, vomiting, or diarrhea.   ER course: Complete blood count shows WBC slightly elevated at 15.4, WBCs appear to be chronically elevated.  Hemoglobin is 8.0 g/dL, consistent with her baseline.  Platelets 439,000.  Comprehensive metabolic panel unremarkable.  Ms. Massiah pain persists despite IV Dilaudid, IV fluids, and IV Toradol.  Will admit for further management of her sickle cell pain  crisis. Labs Reviewed  COMPREHENSIVE METABOLIC PANEL WITH GFR - Abnormal; Notable for the following components:      Result Value   Potassium 3.4 (*)    Creatinine, Ser 0.41 (*)    Calcium 8.3 (*)    AST 44 (*)    Total Bilirubin 2.5 (*)    All other components within normal limits  CBC WITH DIFFERENTIAL/PLATELET - Abnormal; Notable for the following components:   WBC 15.4 (*)    RBC 2.52 (*)    Hemoglobin 8.0 (*)    HCT 23.2 (*)    RDW 19.9 (*)    Platelets 439 (*)    nRBC 0.8 (*)    Neutro Abs 9.9 (*)    Monocytes Absolute 2.1 (*)    Abs Immature Granulocytes 0.09 (*)    All other components within normal limits  RETICULOCYTES - Abnormal; Notable for the following components:   Retic Ct Pct 18.2 (*)    RBC. 2.53 (*)    Retic Count, Absolute 474.6 (*)    Immature Retic Fract 24.7 (*)    All other components within normal limits  CBC - Abnormal; Notable for the following components:   WBC 12.9 (*)    RBC 2.17 (*)    Hemoglobin 6.9 (*)    HCT 19.9 (*)    RDW 19.9 (*)    nRBC 0.4 (*)    All other components within normal limits  BASIC METABOLIC PANEL WITH GFR - Abnormal; Notable for the following components:   Glucose, Bld 101 (*)    Calcium 8.3 (*)    Anion gap  4 (*)    All other components within normal limits  HEMOGLOBIN AND HEMATOCRIT, BLOOD - Abnormal; Notable for the following components:   Hemoglobin 8.5 (*)    HCT 23.8 (*)    All other components within normal limits  CBC - Abnormal; Notable for the following components:   WBC 15.6 (*)    RBC 2.66 (*)    Hemoglobin 8.3 (*)    HCT 24.4 (*)    RDW 19.6 (*)    nRBC 0.3 (*)    All other components within normal limits  HCG, SERUM, QUALITATIVE  TYPE AND SCREEN  PREPARE RBC (CROSSMATCH)      Hospital Course:  Patient was admitted for sickle cell pain crisis and managed appropriately with IVF, IV Dilaudid via PCA and IV Toradol, as well as other adjunct therapies per sickle cell pain management  protocols.patient is reporting significant improvement to pain. She is able to tolerate her PO, without nausea, ambulate without assistance.  Patient was therefore discharged home today in a hemodynamically stable condition.  RX refill electronically sent to pharmacy  [facility] will follow-up with PCP within 1 week of this discharge. Molly Gutierrez was counseled extensively about nonpharmacologic means of pain management, patient verbalized understanding and was appreciative of  the care received during this admission.   We discussed the need for good hydration, monitoring of hydration status, avoidance of heat, cold, stress, and infection triggers. We discussed the need to be adherent with taking Hydrea and other home medications. Patient was reminded of the need to seek medical attention immediately if any symptom of bleeding, anemia, or infection occurs.  Discharge Exam: Vitals:   10/01/23 0612 10/01/23 0708  BP: 106/64   Pulse: 76   Resp: 18 12  Temp: 99 F (37.2 C)   SpO2: 95%    Vitals:   09/30/23 2300 10/01/23 0512 10/01/23 0612 10/01/23 0708  BP:   106/64   Pulse:   76   Resp: 12 11 18 12   Temp:   99 F (37.2 C)   TempSrc:   Oral   SpO2: 95% 94% 95%   Weight:      Height:        General appearance : Awake, alert, not in any distress. Speech Clear. Not toxic looking HEENT: Atraumatic and Normocephalic, pupils equally reactive to light and accomodation Neck: Supple, no JVD. No cervical lymphadenopathy.  Chest: Good air entry bilaterally, no added sounds  CVS: S1 S2 regular, no murmurs.  Abdomen: Bowel sounds present, Non tender and not distended with no gaurding, rigidity or rebound. Extremities: B/L Lower Ext shows no edema, both legs are warm to touch Neurology: Awake alert, and oriented X 3, CN II-XII intact, Non focal Skin: No Rash  Discharge Instructions  Discharge Instructions     Diet - low sodium heart healthy   Complete by: As directed    Increase activity  slowly   Complete by: As directed       Allergies as of 10/01/2023       Reactions   Cefaclor Hives, Swelling, Rash   Hydroxyurea Palpitations, Other (See Comments)   Lowers "blood levels" and heart rate (causes HYPOtension); "it messes me up, it drops my levels and stuff" and Hypotension   Omeprazole Other (See Comments)   Causes "sharp pains in the stomach"   Ketamine Palpitations, Other (See Comments)   "Pt states she has had previous reaction to ketamine. States she becomes flushed, heart races, dizzy, and feels like she is  going to pass out."        Medication List     TAKE these medications    acetaminophen 500 MG tablet Commonly known as: TYLENOL Take 500 mg by mouth every 6 (six) hours as needed for mild pain or headache (or cramps).   albuterol 108 (90 Base) MCG/ACT inhaler Commonly known as: VENTOLIN HFA TAKE 2 PUFFS BY MOUTH EVERY 6 HOURS AS NEEDED FOR WHEEZE OR SHORTNESS OF BREATH What changed: See the new instructions.   Deferiprone 500 MG Tabs Commonly known as: Ferriprox Take 500 mg by mouth 2 (two) times daily. What changed:  how much to take when to take this   diphenhydrAMINE 25 MG tablet Commonly known as: BENADRYL Take 25 mg by mouth See admin instructions. Take 25 mg by mouth every four to six hours with each dose of Hydromorphone   fluticasone 50 MCG/ACT nasal spray Commonly known as: FLONASE Place 1 spray into both nostrils daily as needed for allergies or rhinitis.   folic acid 1 MG tablet Commonly known as: FOLVITE Take 1 tablet (1 mg total) by mouth daily.   mirtazapine 45 MG tablet Commonly known as: REMERON Take 1 tablet (45 mg total) by mouth at bedtime.   naloxone 4 MG/0.1ML Liqd nasal spray kit Commonly known as: NARCAN Place 0.1 sprays (0.4 mg total) into the nose once as needed (opioid overdose).   polyethylene glycol 17 g packet Commonly known as: MIRALAX / GLYCOLAX Take 17 g by mouth daily as needed for mild constipation  (MIX AS DIRECTED AND DRINK).   promethazine 25 MG tablet Commonly known as: PHENERGAN Take 0.5-1 tablets (12.5-25 mg total) by mouth every 6 (six) hours as needed for nausea or vomiting.   Symbicort 80-4.5 MCG/ACT inhaler Generic drug: budesonide-formoterol INHALE 2 PUFFS INTO THE LUNGS TWICE A DAY What changed: See the new instructions.   VITAMIN B-12 PO Take 1 tablet by mouth at bedtime.   Vitamin D (Ergocalciferol) 1.25 MG (50000 UNIT) Caps capsule Commonly known as: DRISDOL Take 1 capsule (50,000 Units total) by mouth once a week. What changed: when to take this   VITAMIN D3 PO Take 1 capsule by mouth at bedtime.   Xarelto 20 MG Tabs tablet Generic drug: rivaroxaban Take 1 tablet (20 mg total) by mouth daily with supper.       ASK your doctor about these medications    HYDROmorphone 4 MG tablet Commonly known as: Dilaudid Take 1 tablet (4 mg total) by mouth every 6 (six) hours as needed for up to 15 days for severe pain (pain score 7-10). Ask about: Which instructions should I use?        The results of significant diagnostics from this hospitalization (including imaging, microbiology, ancillary and laboratory) are listed below for reference.    Significant Diagnostic Studies: No results found.  Microbiology: No results found for this or any previous visit (from the past 240 hours).   Labs: Basic Metabolic Panel: Recent Labs  Lab 09/27/23 0321 09/28/23 0557  NA 135 136  K 3.4* 3.6  CL 105 107  CO2 23 25  GLUCOSE 95 101*  BUN 9 14  CREATININE 0.41* 0.64  CALCIUM 8.3* 8.3*   Liver Function Tests: Recent Labs  Lab 09/27/23 0321  AST 44*  ALT 14  ALKPHOS 45  BILITOT 2.5*  PROT 7.5  ALBUMIN 3.9   No results for input(s): "LIPASE", "AMYLASE" in the last 168 hours. No results for input(s): "AMMONIA" in the last 168  hours. CBC: Recent Labs  Lab 09/27/23 0321 09/28/23 0557 09/28/23 1855 09/29/23 0607  WBC 15.4* 12.9*  --  15.6*  NEUTROABS  9.9*  --   --   --   HGB 8.0* 6.9* 8.5* 8.3*  HCT 23.2* 19.9* 23.8* 24.4*  MCV 92.1 91.7  --  91.7  PLT 439* 372  --  388   Cardiac Enzymes: No results for input(s): "CKTOTAL", "CKMB", "CKMBINDEX", "TROPONINI" in the last 168 hours. BNP: Invalid input(s): "POCBNP" CBG: No results for input(s): "GLUCAP" in the last 168 hours.  Time coordinating discharge: 50 minutes  Signed:  Lorel Roes NP  [facility] 10/01/2023, 4:47 PM

## 2023-10-01 NOTE — Progress Notes (Signed)
 Medication refill sent to pharmacy. PDMP website reviewed. Patient is in compliance.

## 2023-10-02 ENCOUNTER — Telehealth: Payer: Self-pay | Admitting: *Deleted

## 2023-10-02 NOTE — Transitions of Care (Post Inpatient/ED Visit) (Signed)
   10/02/2023  Name: Molly Gutierrez  MRN: [id number] DOB: February 15, 1981  Today's TOC FU Call Status: Today's TOC FU Call Status:: Unsuccessful Call (1st Attempt) Unsuccessful Call (1st Attempt) Date: 10/02/23  Attempted to reach the patient regarding the most recent Inpatient/ED visit.  Follow Up Plan: Additional outreach attempts will be made to reach the patient to complete the Transitions of Care (Post Inpatient/ED visit) call.   Cecilie Coffee [facility], BSN RN Care Manager/ Transition of Care [place]/ [facility] [phone]

## 2023-10-03 ENCOUNTER — Telehealth: Payer: Self-pay | Admitting: *Deleted

## 2023-10-03 NOTE — Transitions of Care (Post Inpatient/ED Visit) (Signed)
 10/03/2023  Name: Molly Gutierrez  MRN: [id number] DOB: Oct 22, 1980  Today's TOC FU Call Status: Today's TOC FU Call Status:: Successful TOC FU Call Completed TOC FU Call Complete Date: 10/03/23 Patient's Name and Date of Birth confirmed.  Transition Care Management Follow-up Telephone Call Date of Discharge: 10/01/23 Discharge Facility: [facility]) Type of Discharge: Inpatient Admission Primary Inpatient Discharge Diagnosis:: sickle cell crisis How have you been since you were released from the hospital?: Better Any questions or concerns?: No  Items Reviewed: Did you receive and understand the discharge instructions provided?: Yes Medications obtained,verified, and reconciled?: Yes (Medications Reviewed) Any new allergies since your discharge?: No Dietary orders reviewed?: NA Do you have support at home?: Yes People in Home [RPT]: child(ren), dependent Name of Support/Comfort Primary Source: 2 children ages 79 and 58. Brother is supportive but does not live in the home with patient  Medications Reviewed Today: Medications Reviewed Today     Reviewed by Lucky Andrea LABOR, RN (Registered Nurse) on 10/03/23 at 1458  Med List Status: <None>   Medication Order Taking? Sig Documenting Provider Last Dose Status Informant  acetaminophen  (TYLENOL ) 500 MG tablet [id number] Yes Take 500 mg by mouth every 6 (six) hours as needed for mild pain or headache (or cramps). [provider] Taking Active Self  albuterol  (VENTOLIN  HFA) 108 (90 Base) MCG/ACT inhaler [id number] Yes TAKE 2 PUFFS BY MOUTH EVERY 6 HOURS AS NEEDED FOR WHEEZE OR SHORTNESS OF BREATH  Patient taking differently: Inhale 2 puffs into the lungs every 6 (six) hours as needed for wheezing or shortness of breath.   Oley Bascom RAMAN, NP Taking Active Self  budesonide -formoterol  (SYMBICORT ) 80-4.5 MCG/ACT inhaler [id number] Yes INHALE 2 PUFFS INTO THE LUNGS TWICE A DAY  Patient taking differently: Inhale 2 puffs into  the lungs 2 (two) times daily as needed.   Oley Bascom RAMAN, NP Taking Active Self  Cholecalciferol  (VITAMIN D3 PO) [id number] Yes Take 1 capsule by mouth at bedtime. [provider] Taking Active Self           Med Note ALLEGRA, [facility] I   Sat Sep 27, 2023  5:47 AM)    Cyanocobalamin  (VITAMIN B-12 PO) [id number] Yes Take 1 tablet by mouth at bedtime. [provider] Taking Active Self           Med Note ALLEGRA, [facility] I   Sat Sep 27, 2023  5:47 AM)    Deferiprone  (FERRIPROX ) 500 MG TABS [id number] Yes Take 500 mg by mouth 2 (two) times daily.  Patient taking differently: Take 1,000 mg by mouth daily.   Tilford Bertram HERO, FNP Taking Active Self           Med Note ALLEGRA, [facility]JI I   Sun Aug 31, 2023  2:52 AM)    diphenhydrAMINE  (BENADRYL ) 25 MG tablet [id number] Yes Take 25 mg by mouth See admin instructions. Take 25 mg by mouth every four to six hours with each dose of Hydromorphone  [provider] Taking Active Self           Med Note DINO, REGEENA   Thu Oct 05, 2020  5:11 PM)    fluticasone  (FLONASE ) 50 MCG/ACT nasal spray [id number]  Place 1 spray into both nostrils daily as needed for allergies or rhinitis. Oley Bascom RAMAN, NP  Expired 05/05/23 2359 Self, Pharmacy Records           Med Note (MARTIN, ANGELENE   Thu Aug 07, 2023  6:09 PM) Medication discontinued  folic  acid (FOLVITE ) 1 MG tablet [id number] Yes Take 1 tablet (1 mg total) by mouth daily. Oley Bascom RAMAN, NP Taking Active Self  HYDROmorphone  (DILAUDID ) 4 MG tablet [id number] Yes Take 1 tablet (4 mg total) by mouth every 6 (six) hours as needed for up to 15 days for severe pain (pain score 7-10). Cherylene Homer HERO, NP Taking Active   mirtazapine  (REMERON ) 45 MG tablet [id number] Yes Take 1 tablet (45 mg total) by mouth at bedtime. Oley Bascom RAMAN, NP Taking Active Self  naloxone  (NARCAN ) nasal spray 4 mg/0.1 mL [id number] No Place 0.1 sprays (0.4 mg total) into the nose once as needed  (opioid overdose).  Patient not taking: Reported on 10/03/2023   Oley Bascom RAMAN, NP Not Taking Active Self           Med Note (Gemayel Mascio A   Fri Oct 03, 2023  2:57 PM) Has on hand  polyethylene glycol (MIRALAX  / GLYCOLAX ) 17 g packet [id number] Yes Take 17 g by mouth daily as needed for mild constipation (MIX AS DIRECTED AND DRINK). Oley Bascom RAMAN, NP Taking Active Self           Med Note ALLEGRA, FLYJFFJI I   Sun Aug 31, 2023  2:53 AM)    promethazine  (PHENERGAN ) 25 MG tablet [id number] Yes Take 0.5-1 tablets (12.5-25 mg total) by mouth every 6 (six) hours as needed for nausea or vomiting. Oley Bascom RAMAN, NP Taking Active Self           Med Note ALLEGRA, [facility]AD I   Sun Aug 31, 2023  2:53 AM)    Vitamin D , Ergocalciferol , (DRISDOL ) 1.25 MG (50000 UNIT) CAPS capsule [id number] Yes Take 1 capsule (50,000 Units total) by mouth once a week.  Patient taking differently: Take 50,000 Units by mouth every Wednesday.   Oley Bascom RAMAN, NP Taking Active Self  XARELTO  20 MG TABS tablet [id number] Yes Take 1 tablet (20 mg total) by mouth daily with supper. Oley Bascom RAMAN, NP Taking Active Self  Med List Note Marisa Nathanel SAILOR, CPhT 04/02/22 1640): The k is in the patient's first name is SILENT              Home Care and Equipment/Supplies: Were Home Health Services Ordered?: No Any new equipment or medical supplies ordered?: No  Functional Questionnaire: Do you need assistance with bathing/showering or dressing?: No Do you need assistance with meal preparation?: No Do you need assistance with eating?: No Do you have difficulty maintaining continence: No Do you need assistance with getting out of bed/getting out of a chair/moving?: No Do you have difficulty managing or taking your medications?: No  Follow up appointments reviewed: PCP Follow-up appointment confirmed?: Yes Date of PCP follow-up appointment?: 10/13/23 Follow-up Provider: Bas[facility]  Follow-up appointment confirmed?: NA Do you need transportation to your follow-up appointment?: No Do you understand care options if your condition(s) worsen?: Yes-patient verbalized understanding  SDOH Interventions Today    Flowsheet Row Most Recent Value  SDOH Interventions   Food Insecurity Interventions Intervention Not Indicated  Housing Interventions Intervention Not Indicated  Transportation Interventions Intervention Not Indicated  Utilities Interventions Intervention Not Indicated       Andrea Dimes RN, BSN [place]  [facility] RN Care Manager [phone]

## 2023-10-07 DIAGNOSIS — D5700 Hb-SS disease with crisis, unspecified: Secondary | ICD-10-CM | POA: Diagnosis not present

## 2023-10-07 DIAGNOSIS — D638 Anemia in other chronic diseases classified elsewhere: Secondary | ICD-10-CM | POA: Diagnosis not present

## 2023-10-07 DIAGNOSIS — Z86718 Personal history of other venous thrombosis and embolism: Secondary | ICD-10-CM | POA: Diagnosis not present

## 2023-10-07 DIAGNOSIS — Z7901 Long term (current) use of anticoagulants: Secondary | ICD-10-CM | POA: Diagnosis not present

## 2023-10-07 DIAGNOSIS — R079 Chest pain, unspecified: Secondary | ICD-10-CM | POA: Diagnosis not present

## 2023-10-07 DIAGNOSIS — Z79899 Other long term (current) drug therapy: Secondary | ICD-10-CM | POA: Diagnosis not present

## 2023-10-07 DIAGNOSIS — Z86711 Personal history of pulmonary embolism: Secondary | ICD-10-CM | POA: Diagnosis not present

## 2023-10-07 DIAGNOSIS — R0789 Other chest pain: Secondary | ICD-10-CM | POA: Diagnosis not present

## 2023-10-07 DIAGNOSIS — F1120 Opioid dependence, uncomplicated: Secondary | ICD-10-CM | POA: Diagnosis not present

## 2023-10-07 DIAGNOSIS — J45909 Unspecified asthma, uncomplicated: Secondary | ICD-10-CM | POA: Diagnosis not present

## 2023-10-07 DIAGNOSIS — Z7951 Long term (current) use of inhaled steroids: Secondary | ICD-10-CM | POA: Diagnosis not present

## 2023-10-07 DIAGNOSIS — G894 Chronic pain syndrome: Secondary | ICD-10-CM | POA: Diagnosis not present

## 2023-10-08 ENCOUNTER — Other Ambulatory Visit: Payer: Self-pay

## 2023-10-13 ENCOUNTER — Other Ambulatory Visit: Payer: Self-pay

## 2023-10-13 ENCOUNTER — Ambulatory Visit: Payer: Self-pay | Admitting: Nurse Practitioner

## 2023-10-15 ENCOUNTER — Other Ambulatory Visit: Payer: Self-pay | Admitting: Family Medicine

## 2023-10-15 ENCOUNTER — Other Ambulatory Visit (HOSPITAL_COMMUNITY): Payer: Self-pay | Admitting: Pharmacy Technician

## 2023-10-15 ENCOUNTER — Other Ambulatory Visit (HOSPITAL_COMMUNITY): Payer: Self-pay

## 2023-10-15 ENCOUNTER — Other Ambulatory Visit: Payer: Self-pay

## 2023-10-15 MED ORDER — DEFERIPRONE 500 MG PO TABS
500.0000 mg | ORAL_TABLET | Freq: Two times a day (BID) | ORAL | 0 refills | Status: AC
  Filled 2023-10-15: qty 60, 30d supply, fill #0
  Filled 2023-11-07: qty 60, 30d supply, fill #1
  Filled 2024-01-26 – 2024-05-11 (×3): qty 60, 30d supply, fill #2

## 2023-10-15 NOTE — Progress Notes (Signed)
 Specialty Pharmacy Refill Coordination Note  Molly Gutierrez  is a 43 y.o. female contacted today regarding refills of specialty medication(s) Deferiprone    Patient requested Delivery   Delivery date: 10/17/23 (Unable to deliver requested date. Need refill.)   Verified address: [address]. [name] [address]   Medication will be filled on 10/16/23.   This fill date is pending response to refill request from provider.   Unable to deliver by requested date; sent refill to MD, unable to leave message; call cannot be completed as dialed. Tried twice.

## 2023-10-15 NOTE — Telephone Encounter (Signed)
 Please advise [facility]

## 2023-10-16 ENCOUNTER — Other Ambulatory Visit: Payer: Self-pay

## 2023-10-17 ENCOUNTER — Other Ambulatory Visit: Payer: Self-pay | Admitting: Nurse Practitioner

## 2023-10-17 ENCOUNTER — Telehealth: Payer: Self-pay | Admitting: Nurse Practitioner

## 2023-10-17 ENCOUNTER — Other Ambulatory Visit: Payer: Self-pay

## 2023-10-17 ENCOUNTER — Telehealth: Payer: Self-pay

## 2023-10-17 ENCOUNTER — Other Ambulatory Visit (HOSPITAL_COMMUNITY): Payer: Self-pay

## 2023-10-17 DIAGNOSIS — D5700 Hb-SS disease with crisis, unspecified: Secondary | ICD-10-CM

## 2023-10-17 DIAGNOSIS — G894 Chronic pain syndrome: Secondary | ICD-10-CM

## 2023-10-17 MED ORDER — HYDROMORPHONE HCL 4 MG PO TABS
4.0000 mg | ORAL_TABLET | Freq: Four times a day (QID) | ORAL | 0 refills | Status: DC | PRN
  Filled 2023-10-17: qty 60, 15d supply, fill #0

## 2023-10-17 NOTE — Telephone Encounter (Unsigned)
 Copied from CRM [id number]. Topic: Clinical - Medication Refill >> Oct 17, 2023 10:08 AM Leory Rands wrote: Most Recent Primary Care Visit:  Provider: Blanchard Bunk VISIT  Department: [facility]  Visit Type: MEDICARE AWV, SEQUENTIAL  Date: 07/24/2023  Medication: Rx #: [id number]  HYDROmorphone  (DILAUDID ) 4 MG tablet [[id number]]  ENDED    Has the patient contacted their pharmacy? Yes (Agent: If no, request that the patient contact the pharmacy for the refill. If patient does not wish to contact the pharmacy document the reason why and proceed with request.) (Agent: If yes, when and what did the pharmacy advise?)  Is this the correct pharmacy for this prescription? Yes If no, delete pharmacy and type the correct one.  This is the patient's preferred pharmacy:  [facility] [address] [place] [address] Phone: [phone] Fax: [phone]     Has the prescription been filled recently? Yes  Is the patient out of the medication? Yes  Has the patient been seen for an appointment in the last year OR does the patient have an upcoming appointment? Yes  Can we respond through MyChart? Yes  Agent: Please be advised that Rx refills may take up to 3 business days. We ask that you follow-up with your pharmacy. >> Oct 17, 2023  3:22 PM Felizardo Hotter wrote: Pt called wanting to know status of medication which was provided.

## 2023-10-17 NOTE — Telephone Encounter (Signed)
 Caller & Relationship to patient:   MRN #  [id number]   Call Back Number:   Date of Last Office Visit: 10/15/2023     Date of Next Office Visit: Visit date not found    Medication(s) to be Refilled: Dilaudid  4mg   Preferred Pharmacy:   ** Please notify patient to allow 48-72 hours to process** **Let patient know to contact pharmacy at the end of the day to make sure medication is ready. ** **If patient has not been seen in a year or longer, book an appointment **Advise to use MyChart for refill requests OR to contact their pharmacy

## 2023-10-17 NOTE — Telephone Encounter (Signed)
 Copied from CRM [id number]. Topic: Clinical - Medication Refill >> Oct 17, 2023 10:08 AM Leory Rands wrote: Most Recent Primary Care Visit:  Provider: Blanchard Bunk VISIT  Department: [facility]  Visit Type: MEDICARE AWV, SEQUENTIAL  Date: 07/24/2023  Medication: Rx #: [id number]  HYDROmorphone  (DILAUDID ) 4 MG tablet [[id number]]  ENDED    Has the patient contacted their pharmacy? Yes (Agent: If no, request that the patient contact the pharmacy for the refill. If patient does not wish to contact the pharmacy document the reason why and proceed with request.) (Agent: If yes, when and what did the pharmacy advise?)  Is this the correct pharmacy for this prescription? Yes If no, delete pharmacy and type the correct one.  This is the patient's preferred pharmacy:  [facility]ode[facility] Spruce [facility] [address] [place] [address] Phone: [phone] Fax: [phone]     Has the prescription been filled recently? Yes  Is the patient out of the medication? Yes  Has the patient been seen for an appointment in the last year OR does the patient have an upcoming appointment? Yes  Can we respond through MyChart? Yes  Agent: Please be advised that Rx refills may take up to 3 business days. We ask that you follow-up with your pharmacy.

## 2023-10-22 ENCOUNTER — Telehealth: Payer: Self-pay | Admitting: *Deleted

## 2023-10-22 NOTE — Transitions of Care (Post Inpatient/ED Visit) (Signed)
   10/22/2023  Name: Molly Gutierrez  MRN: [id number] DOB: 1980/11/06  Today's TOC FU Call Status: Today's TOC FU Call Status:: Unsuccessful Call (1st Attempt) Unsuccessful Call (1st Attempt) Date: 10/22/23  Attempted to reach the patient regarding the most recent Inpatient/ED visit.  Follow Up Plan: Additional outreach attempts will be made to reach the patient to complete the Transitions of Care (Post Inpatient/ED visit) call.   Cecilie Coffee [facility], BSN RN Care Manager/ Transition of Care [place]/ [facility] [phone]

## 2023-10-23 ENCOUNTER — Telehealth: Payer: Self-pay | Admitting: *Deleted

## 2023-10-23 NOTE — Transitions of Care (Post Inpatient/ED Visit) (Signed)
 10/23/2023  Name: Molly Gutierrez  MRN: [id number] DOB: [birth year]  Today's TOC FU Call Status: Today's TOC FU Call Status:: Successful TOC FU Call Completed TOC FU Call Complete Date: 10/23/23 Patient's Name and Date of Birth confirmed.  Transition Care Management Follow-up Telephone Call Date of Discharge: 10/21/23 Discharge Facility: Other ([facility]) Name of Other ([facility]) [facility]charge Facility: [facility] Type of Discharge: Inpatient Admission Primary Inpatient Discharge Diagnosis:: sickle cell crisis/ anemia How have you been since you were released from the hospital?:  (pt eating, drinking well, no issues with bowel/ bladder) Any questions or concerns?: No  Items Reviewed: Did you receive and understand the discharge instructions provided?: Yes Any new allergies since your discharge?: No Dietary orders reviewed?: Yes Type of Diet Ordered:: heart healthy Do you have support at home?: Yes People in Home [RPT]: child(ren), dependent, sibling(s) Name of Support/Comfort Primary Source: 2 children ages 51 and 20,  brother does not live with pt but is supportive Reviewed importance of staying well hydrated, avoiding extremes of temperature, keeping stress to a minimum, taking all medications as prescribed.  Medications Reviewed Today: Medications Reviewed Today     Reviewed by Daralyn Earl, RN (Registered Nurse) on 10/23/23 at 1047  Med List Status: <None>   Medication Order Taking? Sig Documenting Provider Last Dose Status Informant  acetaminophen  (TYLENOL ) 500 MG tablet [id number] Yes Take 500 mg by mouth every 6 (six) hours as needed for mild pain or headache (or cramps). [provider] Taking Active Self  albuterol  (VENTOLIN  HFA) 108 (90 Base) MCG/ACT inhaler [id number] Yes TAKE 2 PUFFS BY MOUTH EVERY 6 HOURS AS NEEDED FOR WHEEZE OR SHORTNESS OF BREATH  Patient taking differently: Inhale 2 puffs into the lungs every 6 (six) hours as needed for  wheezing or shortness of breath.   Jerrlyn Morel, NP Taking Active Self  budesonide -formoterol  (SYMBICORT ) 80-4.5 MCG/ACT inhaler [id number] Yes INHALE 2 PUFFS INTO THE LUNGS TWICE A DAY  Patient taking differently: Inhale 2 puffs into the lungs 2 (two) times daily as needed.   Jerrlyn Morel, NP Taking Active Self  Cholecalciferol  (VITAMIN D3 PO) [id number] Yes Take 1 capsule by mouth at bedtime. [provider] Taking Active Self           Med Note Nolan Battle, [facility] I   Sat Sep 27, 2023  5:47 AM)    Cyanocobalamin  (VITAMIN B-12 PO) [id number] Yes Take 1 tablet by mouth at bedtime. [provider] Taking Active Self           Med Note Nolan Battle, [facility] I   Sat Sep 27, 2023  5:47 AM)    Deferiprone  (FERRIPROX ) 500 MG TABS [id number] Yes Take 500 mg by mouth 2 (two) times daily. Jerrlyn Morel, NP Taking Active   diphenhydrAMINE  (BENADRYL ) 25 MG tablet [id number] Yes Take 25 mg by mouth See admin instructions. Take 25 mg by mouth every four to six hours with each dose of Hydromorphone  [provider] Taking Active Self           Med Note Drexel Gentles, REGEENA   Thu Oct 05, 2020  5:11 PM)    fluticasone  (FLONASE ) 50 MCG/ACT nasal spray [id number]  Place 1 spray into both nostrils daily as needed for allergies or rhinitis. Jerrlyn Morel, NP  Expired 05/05/23 2359 Self, Pharmacy Records           Med Note (MARTIN, ANGELENE   Thu Aug 07, 2023  6:09 PM) Medication discontinued  folic acid  (FOLVITE )  1 MG tablet [id number] Yes Take 1 tablet (1 mg total) by mouth daily. Jerrlyn Morel, NP Taking Active Self  HYDROmorphone  (DILAUDID ) 4 MG tablet [id number] Yes Take 1 tablet (4 mg total) by mouth every 6 (six) hours as needed for severe pain (pain score 7-10). Jerrlyn Morel, NP Taking Active   mirtazapine  (REMERON ) 45 MG tablet [id number] Yes Take 1 tablet (45 mg total) by mouth at bedtime. Jerrlyn Morel, NP Taking Active Self  naloxone  (NARCAN ) nasal spray 4 mg/0.1  mL [id number] No Place 0.1 sprays (0.4 mg total) into the nose once as needed (opioid overdose).  Patient not taking: Reported on 10/03/2023   Jerrlyn Morel, NP Not Taking Active Self           Med Note Kipp People, Laurita Peron A   Thu Oct 23, 2023 10:47 AM) Has on hand  polyethylene glycol (MIRALAX  / GLYCOLAX ) 17 g packet [id number] Yes Take 17 g by mouth daily as needed for mild constipation (MIX AS DIRECTED AND DRINK). Jerrlyn Morel, NP Taking Active Self           Med Note Nolan Battle, [facility] I   Sun Aug 31, 2023  2:53 AM)    promethazine  (PHENERGAN ) 25 MG tablet [id number] Yes Take 0.5-1 tablets (12.5-25 mg total) by mouth every 6 (six) hours as needed for nausea or vomiting. Jerrlyn Morel, NP Taking Active Self           Med Note Nolan Battle, [facility] I   Sun Aug 31, 2023  2:53 AM)    Vitamin D , Ergocalciferol , (DRISDOL ) 1.25 MG (50000 UNIT) CAPS capsule [id number] Yes Take 1 capsule (50,000 Units total) by mouth once a week.  Patient taking differently: Take 50,000 Units by mouth every Wednesday.   Jerrlyn Morel, NP Taking Active Self  XARELTO  20 MG TABS tablet [id number] Yes Take 1 tablet (20 mg total) by mouth daily with supper. Jerrlyn Morel, NP Taking Active Self  Med List Note Thurman Flores, CPhT 04/02/22 1640): The "k" is in the patient's first name is SILENT              Home Care and Equipment/Supplies: Were Home Health Services Ordered?: No Any new equipment or medical supplies ordered?: No  Functional Questionnaire: Do you need assistance with bathing/showering or dressing?: No Do you need assistance with meal preparation?: No Do you need assistance with eating?: No Do you have difficulty maintaining continence: No Do you need assistance with getting out of bed/getting out of a chair/moving?: No Do you have difficulty managing or taking your medications?: No  Follow up appointments reviewed: PCP Follow-up appointment confirmed?: Yes Date of PCP follow-up  appointment?: 10/24/23 (collaborated with care guide and scheduled sooner appointment) Follow-up Provider: Abbey Hobby @ 2 pm,  arrive 145 pm, pt verbalizes understanding Specialist Hospital Follow-up appointment confirmed?: NA Do you need transportation to your follow-up appointment?: No Do you understand care options if your condition(s) worsen?: Yes-patient verbalized understanding  SDOH Interventions Today    Flowsheet Row Most Recent Value  SDOH Interventions   Food Insecurity Interventions Intervention Not Indicated  Housing Interventions Intervention Not Indicated  Transportation Interventions Intervention Not Indicated  Utilities Interventions Intervention Not Indicated       Cecilie Coffee [facility], BSN RN Care Manager/ Transition of Care [place]/ [facility] Population Health [phone]

## 2023-10-24 ENCOUNTER — Inpatient Hospital Stay: Payer: Self-pay | Admitting: Nurse Practitioner

## 2023-10-27 ENCOUNTER — Ambulatory Visit: Payer: Self-pay | Admitting: Nurse Practitioner

## 2023-10-30 DIAGNOSIS — Z86711 Personal history of pulmonary embolism: Secondary | ICD-10-CM | POA: Diagnosis not present

## 2023-10-30 DIAGNOSIS — J4540 Moderate persistent asthma, uncomplicated: Secondary | ICD-10-CM | POA: Diagnosis not present

## 2023-10-30 DIAGNOSIS — R079 Chest pain, unspecified: Secondary | ICD-10-CM | POA: Diagnosis not present

## 2023-10-30 DIAGNOSIS — D571 Sickle-cell disease without crisis: Secondary | ICD-10-CM | POA: Diagnosis not present

## 2023-10-30 DIAGNOSIS — D5700 Hb-SS disease with crisis, unspecified: Secondary | ICD-10-CM | POA: Diagnosis not present

## 2023-10-30 DIAGNOSIS — D57219 Sickle-cell/Hb-C disease with crisis, unspecified: Secondary | ICD-10-CM | POA: Diagnosis not present

## 2023-10-30 DIAGNOSIS — Z7901 Long term (current) use of anticoagulants: Secondary | ICD-10-CM | POA: Diagnosis not present

## 2023-10-30 DIAGNOSIS — D638 Anemia in other chronic diseases classified elsewhere: Secondary | ICD-10-CM | POA: Diagnosis not present

## 2023-10-30 DIAGNOSIS — Y710 Diagnostic and monitoring cardiovascular devices associated with adverse incidents: Secondary | ICD-10-CM | POA: Diagnosis not present

## 2023-10-30 DIAGNOSIS — Z7951 Long term (current) use of inhaled steroids: Secondary | ICD-10-CM | POA: Diagnosis not present

## 2023-10-30 DIAGNOSIS — R17 Unspecified jaundice: Secondary | ICD-10-CM | POA: Diagnosis not present

## 2023-10-30 DIAGNOSIS — J45909 Unspecified asthma, uncomplicated: Secondary | ICD-10-CM | POA: Diagnosis not present

## 2023-10-30 DIAGNOSIS — D72829 Elevated white blood cell count, unspecified: Secondary | ICD-10-CM | POA: Diagnosis not present

## 2023-10-30 DIAGNOSIS — D649 Anemia, unspecified: Secondary | ICD-10-CM | POA: Diagnosis not present

## 2023-10-30 DIAGNOSIS — R0789 Other chest pain: Secondary | ICD-10-CM | POA: Diagnosis not present

## 2023-10-30 DIAGNOSIS — G894 Chronic pain syndrome: Secondary | ICD-10-CM | POA: Diagnosis not present

## 2023-10-31 ENCOUNTER — Other Ambulatory Visit: Payer: Self-pay | Admitting: Nurse Practitioner

## 2023-10-31 DIAGNOSIS — D5700 Hb-SS disease with crisis, unspecified: Secondary | ICD-10-CM

## 2023-10-31 DIAGNOSIS — G894 Chronic pain syndrome: Secondary | ICD-10-CM

## 2023-11-03 ENCOUNTER — Other Ambulatory Visit (HOSPITAL_COMMUNITY): Payer: Self-pay

## 2023-11-03 ENCOUNTER — Encounter (HOSPITAL_COMMUNITY): Payer: Self-pay

## 2023-11-03 MED ORDER — HYDROMORPHONE HCL 4 MG PO TABS
4.0000 mg | ORAL_TABLET | Freq: Four times a day (QID) | ORAL | 0 refills | Status: DC | PRN
  Filled 2023-11-03 – 2023-11-06 (×2): qty 60, 15d supply, fill #0

## 2023-11-04 ENCOUNTER — Other Ambulatory Visit (HOSPITAL_COMMUNITY): Payer: Self-pay

## 2023-11-04 ENCOUNTER — Other Ambulatory Visit: Payer: Self-pay

## 2023-11-05 ENCOUNTER — Other Ambulatory Visit: Payer: Self-pay

## 2023-11-05 ENCOUNTER — Other Ambulatory Visit (HOSPITAL_COMMUNITY): Payer: Self-pay

## 2023-11-06 ENCOUNTER — Other Ambulatory Visit (HOSPITAL_COMMUNITY): Payer: Self-pay

## 2023-11-06 ENCOUNTER — Other Ambulatory Visit: Payer: Self-pay

## 2023-11-07 ENCOUNTER — Other Ambulatory Visit: Payer: Self-pay

## 2023-11-07 ENCOUNTER — Other Ambulatory Visit (HOSPITAL_COMMUNITY): Payer: Self-pay

## 2023-11-07 ENCOUNTER — Ambulatory Visit: Payer: Self-pay | Admitting: Nurse Practitioner

## 2023-11-07 NOTE — Progress Notes (Signed)
 Specialty Pharmacy Refill Coordination Note  Molly Gutierrez  is a 43 y.o. female contacted today regarding refills of specialty medication(s) Deferiprone    Patient requested (Patient-Rptd) Delivery   Delivery date: 11/12/23   Verified address: (Patient-Rptd) [address] [place] [address]   Medication will be filled on 11/11/23.   Patient was left a voice mail that the requested delivery date of 11/07/23 was too soon per insurance and has been rescheduled for 11/12/23. Patient also indicated she had a question for a pharmacist and was advised to follow up with the pharmacy.

## 2023-11-11 ENCOUNTER — Other Ambulatory Visit (HOSPITAL_COMMUNITY): Payer: Self-pay

## 2023-11-11 ENCOUNTER — Other Ambulatory Visit (HOSPITAL_BASED_OUTPATIENT_CLINIC_OR_DEPARTMENT_OTHER): Payer: Self-pay

## 2023-11-11 ENCOUNTER — Other Ambulatory Visit: Payer: Self-pay

## 2023-11-11 ENCOUNTER — Telehealth: Payer: Self-pay | Admitting: *Deleted

## 2023-11-11 DIAGNOSIS — Z7901 Long term (current) use of anticoagulants: Secondary | ICD-10-CM | POA: Diagnosis not present

## 2023-11-11 DIAGNOSIS — J4520 Mild intermittent asthma, uncomplicated: Secondary | ICD-10-CM | POA: Diagnosis not present

## 2023-11-11 DIAGNOSIS — E162 Hypoglycemia, unspecified: Secondary | ICD-10-CM | POA: Diagnosis not present

## 2023-11-11 DIAGNOSIS — E876 Hypokalemia: Secondary | ICD-10-CM | POA: Diagnosis not present

## 2023-11-11 DIAGNOSIS — D5700 Hb-SS disease with crisis, unspecified: Secondary | ICD-10-CM | POA: Diagnosis not present

## 2023-11-11 DIAGNOSIS — R079 Chest pain, unspecified: Secondary | ICD-10-CM | POA: Diagnosis not present

## 2023-11-11 DIAGNOSIS — R0789 Other chest pain: Secondary | ICD-10-CM | POA: Diagnosis not present

## 2023-11-11 DIAGNOSIS — Z7951 Long term (current) use of inhaled steroids: Secondary | ICD-10-CM | POA: Diagnosis not present

## 2023-11-11 DIAGNOSIS — Z86711 Personal history of pulmonary embolism: Secondary | ICD-10-CM | POA: Diagnosis not present

## 2023-11-11 DIAGNOSIS — E872 Acidosis, unspecified: Secondary | ICD-10-CM | POA: Diagnosis not present

## 2023-11-11 NOTE — Transitions of Care (Post Inpatient/ED Visit) (Signed)
   11/11/2023  Name: Molly Gutierrez  MRN: [id number] DOB: 1981-01-28  Today's TOC FU Call Status: Today's TOC FU Call Status:: Unsuccessful Call (1st Attempt) Unsuccessful Call (1st Attempt) Date: 11/11/23  Attempted to reach the patient regarding the most recent Inpatient/ED visit.  Follow Up Plan: Additional outreach attempts will be made to reach the patient to complete the Transitions of Care (Post Inpatient/ED visit) call.   Cecilie Coffee [facility], BSN RN Care Manager/ Transition of Care [place]/ [facility] [phone]

## 2023-11-12 ENCOUNTER — Telehealth: Payer: Self-pay

## 2023-11-12 NOTE — Transitions of Care (Post Inpatient/ED Visit) (Signed)
   11/12/2023  Name: Molly Gutierrez  MRN: [id number] DOB: 1981-05-22  Today's TOC FU Call Status: Today's TOC FU Call Status:: Unsuccessful Call (2nd Attempt) Unsuccessful Call (2nd Attempt) Date: 11/12/23  Attempted to reach the patient regarding the most recent Inpatient/ED visit.  Follow Up Plan: Additional outreach attempts will be made to reach the patient to complete the Transitions of Care (Post Inpatient/ED visit) call.   Orpha Blade, RN, BSN, CEN [employer]- Transition of Care Team.  [facility] [phone]

## 2023-11-13 ENCOUNTER — Telehealth: Payer: Self-pay | Admitting: *Deleted

## 2023-11-13 NOTE — Transitions of Care (Post Inpatient/ED Visit) (Signed)
   11/13/2023  Name: Molly Gutierrez  MRN: [id number] DOB: 1980-11-22  Today's TOC FU Call Status: Today's TOC FU Call Status:: Unsuccessful Call (3rd Attempt) TOC FU Call Complete Date: 11/13/23  Attempted to reach the patient regarding the most recent Inpatient/ED visit.  Follow Up Plan: No further outreach attempts will be made at this time. We have been unable to contact the patient.  Cecilie Coffee [facility], BSN RN Care Manager/ Transition of Care [place]/ [facility] [phone]

## 2023-11-20 ENCOUNTER — Telehealth: Payer: Self-pay | Admitting: *Deleted

## 2023-11-20 NOTE — Transitions of Care (Post Inpatient/ED Visit) (Signed)
   11/20/2023  Name: Molly Gutierrez  MRN: [id number] DOB: February 01, 1981  Today's TOC FU Call Status: Today's TOC FU Call Status:: Unsuccessful Call (1st Attempt) Unsuccessful Call (1st Attempt) Date: 11/20/23  Attempted to reach the patient regarding the most recent Inpatient/ED visit.  Follow Up Plan: Additional outreach attempts will be made to reach the patient to complete the Transitions of Care (Post Inpatient/ED visit) call.   Cecilie Coffee [facility], BSN RN Care Manager/ Transition of Care [place]/ [facility] [phone]

## 2023-11-21 ENCOUNTER — Telehealth: Payer: Self-pay | Admitting: *Deleted

## 2023-11-21 NOTE — Transitions of Care (Post Inpatient/ED Visit) (Signed)
   11/21/2023  Name: Molly Gutierrez  MRN: [id number] DOB: April 27, 1981  Today's TOC FU Call Status: Today's TOC FU Call Status:: Unsuccessful Call (2nd Attempt) Unsuccessful Call (2nd Attempt) Date: 11/21/23  Attempted to reach the patient regarding the most recent Inpatient visit.  Left HIPAA compliant voice message requesting call back  Follow Up Plan: Additional outreach attempts will be made to reach the patient to complete the Transitions of Care (Post Inpatient/ED visit) call.   Pls call/ message for questions,  Kameela Leipold Mckinney Vicente Weidler, RN, BSN, CCRN [employer] RN Care Manager  Transitions of Care  [facility] - [facility] [phone]: direct office

## 2023-11-22 ENCOUNTER — Other Ambulatory Visit: Payer: Self-pay | Admitting: Nurse Practitioner

## 2023-11-22 DIAGNOSIS — D5700 Hb-SS disease with crisis, unspecified: Secondary | ICD-10-CM

## 2023-11-22 DIAGNOSIS — G894 Chronic pain syndrome: Secondary | ICD-10-CM

## 2023-11-24 ENCOUNTER — Other Ambulatory Visit: Payer: Self-pay

## 2023-11-24 ENCOUNTER — Telehealth: Payer: Self-pay

## 2023-11-24 ENCOUNTER — Other Ambulatory Visit: Payer: Self-pay | Admitting: Nurse Practitioner

## 2023-11-24 ENCOUNTER — Other Ambulatory Visit (HOSPITAL_COMMUNITY): Payer: Self-pay

## 2023-11-24 NOTE — Transitions of Care (Post Inpatient/ED Visit) (Signed)
   11/24/2023  Name: Molly Gutierrez  MRN: [id number] DOB: 1980/07/27  Today's TOC FU Call Status: Today's TOC FU Call Status:: Unsuccessful Call (3rd Attempt) Unsuccessful Call (3rd Attempt) Date: 11/24/23  Attempted to reach the patient regarding the most recent Inpatient/ED visit.  Follow Up Plan: No further outreach attempts will be made at this time. We have been unable to contact the patient.  Baelynn Schmuhl J. Mychaela Lennartz RN, MSN [facility], [facility] RN Care Manager Direct Dial: [phone]  Fax: [phone] Website: [email or web address]

## 2023-11-25 ENCOUNTER — Other Ambulatory Visit: Payer: Self-pay

## 2023-11-26 ENCOUNTER — Other Ambulatory Visit (HOSPITAL_COMMUNITY): Payer: Self-pay

## 2023-11-26 ENCOUNTER — Other Ambulatory Visit: Payer: Self-pay

## 2023-11-26 ENCOUNTER — Ambulatory Visit: Payer: Self-pay | Admitting: *Deleted

## 2023-11-26 ENCOUNTER — Other Ambulatory Visit: Payer: Self-pay | Admitting: Nurse Practitioner

## 2023-11-26 DIAGNOSIS — G894 Chronic pain syndrome: Secondary | ICD-10-CM

## 2023-11-26 DIAGNOSIS — D5700 Hb-SS disease with crisis, unspecified: Secondary | ICD-10-CM | POA: Diagnosis not present

## 2023-11-26 DIAGNOSIS — D571 Sickle-cell disease without crisis: Secondary | ICD-10-CM | POA: Diagnosis not present

## 2023-11-26 DIAGNOSIS — R7989 Other specified abnormal findings of blood chemistry: Secondary | ICD-10-CM | POA: Diagnosis not present

## 2023-11-26 DIAGNOSIS — R079 Chest pain, unspecified: Secondary | ICD-10-CM | POA: Diagnosis not present

## 2023-11-26 DIAGNOSIS — R0789 Other chest pain: Secondary | ICD-10-CM | POA: Diagnosis not present

## 2023-11-26 NOTE — Telephone Encounter (Signed)
 Copied from CRM [id number]. Topic: Clinical - Red Word Triage >> Nov 26, 2023  1:29 PM Essie A wrote: Red Word that prompted transfer to Nurse Triage: Chest pains since Monday Reason for Disposition  [1] Patient says chest pain feels exactly the same as previously diagnosed heartburn AND [2] describes burning in chest AND [3] accompanying sour taste in mouth    Pt is having sickle cell crisis.  Treated this morning at [facility].  Needing a refill of her Dilaudid  4 mg from Abbey H[facility].  Answer Assessment - Initial Assessment Questions 1. LOCATION: Where does it hurt?       I've been having chest pain since Monday.   I have sickle cell crisis and it affects me in my chest.    After questioning her more she was seen and treated at [facility] earlier this morning for sickle cell crisis and released.   She is needing her Dilaudid  4 mg pain medication refilled.   She sent in the request but has not heard anything back.   Inquiring on the status of the Dilaudid .   On pain 7-8/10    2. RADIATION: Does the pain go anywhere else? (e.g., into neck, jaw, arms, back)     It's just in my chest. 3. ONSET: When did the chest pain begin? (Minutes, hours or days)      Since Monday.   I've been in sickle cell crisis.   It's my usual place where it affects me in my chest. I am low on my pain medicine.   I put in a request for it on Monday.   My doctor has not put it in yet.   I really need it.   I was released after being treated this morning with the plan to have my pain medicine.   Another provider can't write for the Dilaudid .  It has to be my doctor.  So they didn't give me pain medicine to go home with this morning. 4. PATTERN: Does the pain come and go, or has it been constant since it started?  Does it get worse with exertion?      Constant  5. DURATION: How long does it last (e.g., seconds, minutes, hours)     Since Monday 6. SEVERITY: How  bad is the pain?  (e.g., Scale 1-10; mild, moderate, or severe)    - MILD (1-3): doesn't interfere with normal activities     - MODERATE (4-7): interferes with normal activities or awakens from sleep    - SEVERE (8-10): excruciating pain, unable to do any normal activities       7-8/10 7. CARDIAC RISK FACTORS: Do you have any history of heart problems or risk factors for heart disease? (e.g., angina, prior heart attack; diabetes, high blood pressure, high cholesterol, smoker, or strong family history of heart disease)     Sickle cell crisis always affects my chest. 8. PULMONARY RISK FACTORS: Do you have any history of lung disease?  (e.g., blood clots in lung, asthma, emphysema, birth control pills)     Sickle cell 9. CAUSE: What do you think is causing the chest pain?     Sickle cell crisis 10. OTHER SYMPTOMS: Do you have any other symptoms? (e.g., dizziness, nausea, vomiting, sweating, fever, difficulty breathing, cough)       No 11. PREGNANCY: Is there any chance you are pregnant? When was your last menstrual period?       Not  asked  Protocols used: Chest Pain-A-AH Pt treated for sickle cell crisis at [facility] earlier this morning.   Needing a refill of Dilaudid  4 mg from Abbey Hobby, NP ASAP.   Request submitted earlier this week.         FYI Only or Action Required?: Action required by provider  Patient was last seen in primary care on 06/02/2023 by Jerrlyn Morel, NP. Called Nurse Triage reporting Chest Pain and Medication Problem (Needs Dilaudid  refill ASAP). Symptoms began several days ago. Interventions attempted: Prescription medications: Dilaudid  needs refill. Symptoms are: unchanged.  Triage Disposition: Call PCP Within 24 Hours  Patient/caregiver understands and will follow disposition?:  Yes

## 2023-11-27 MED ORDER — HYDROMORPHONE HCL 4 MG PO TABS
4.0000 mg | ORAL_TABLET | Freq: Four times a day (QID) | ORAL | 0 refills | Status: DC | PRN
  Filled 2023-11-27: qty 60, 15d supply, fill #0

## 2023-11-28 ENCOUNTER — Other Ambulatory Visit: Payer: Self-pay

## 2023-11-28 ENCOUNTER — Other Ambulatory Visit (HOSPITAL_COMMUNITY): Payer: Self-pay

## 2023-12-01 ENCOUNTER — Other Ambulatory Visit: Payer: Self-pay

## 2023-12-09 ENCOUNTER — Other Ambulatory Visit: Payer: Self-pay

## 2023-12-09 ENCOUNTER — Other Ambulatory Visit: Payer: Self-pay | Admitting: Nurse Practitioner

## 2023-12-09 DIAGNOSIS — G894 Chronic pain syndrome: Secondary | ICD-10-CM

## 2023-12-09 DIAGNOSIS — D5700 Hb-SS disease with crisis, unspecified: Secondary | ICD-10-CM

## 2023-12-10 ENCOUNTER — Other Ambulatory Visit: Payer: Self-pay | Admitting: Nurse Practitioner

## 2023-12-10 ENCOUNTER — Other Ambulatory Visit: Payer: Self-pay

## 2023-12-10 DIAGNOSIS — R079 Chest pain, unspecified: Secondary | ICD-10-CM | POA: Diagnosis not present

## 2023-12-10 DIAGNOSIS — K5903 Drug induced constipation: Secondary | ICD-10-CM | POA: Diagnosis not present

## 2023-12-10 DIAGNOSIS — D649 Anemia, unspecified: Secondary | ICD-10-CM | POA: Diagnosis not present

## 2023-12-10 DIAGNOSIS — G894 Chronic pain syndrome: Secondary | ICD-10-CM

## 2023-12-10 DIAGNOSIS — D72829 Elevated white blood cell count, unspecified: Secondary | ICD-10-CM | POA: Diagnosis not present

## 2023-12-10 DIAGNOSIS — T402X5A Adverse effect of other opioids, initial encounter: Secondary | ICD-10-CM | POA: Diagnosis not present

## 2023-12-10 DIAGNOSIS — D72825 Bandemia: Secondary | ICD-10-CM | POA: Diagnosis not present

## 2023-12-10 DIAGNOSIS — R0789 Other chest pain: Secondary | ICD-10-CM | POA: Diagnosis not present

## 2023-12-10 DIAGNOSIS — J4540 Moderate persistent asthma, uncomplicated: Secondary | ICD-10-CM | POA: Diagnosis not present

## 2023-12-10 DIAGNOSIS — D5700 Hb-SS disease with crisis, unspecified: Secondary | ICD-10-CM | POA: Diagnosis not present

## 2023-12-10 DIAGNOSIS — D75839 Thrombocytosis, unspecified: Secondary | ICD-10-CM | POA: Diagnosis not present

## 2023-12-10 DIAGNOSIS — R7402 Elevation of levels of lactic acid dehydrogenase (LDH): Secondary | ICD-10-CM | POA: Diagnosis not present

## 2023-12-10 DIAGNOSIS — Z7901 Long term (current) use of anticoagulants: Secondary | ICD-10-CM | POA: Diagnosis not present

## 2023-12-10 DIAGNOSIS — J4520 Mild intermittent asthma, uncomplicated: Secondary | ICD-10-CM | POA: Diagnosis not present

## 2023-12-10 DIAGNOSIS — Z86711 Personal history of pulmonary embolism: Secondary | ICD-10-CM | POA: Diagnosis not present

## 2023-12-10 NOTE — Telephone Encounter (Signed)
 Please advise [facility]

## 2023-12-11 ENCOUNTER — Other Ambulatory Visit: Payer: Self-pay | Admitting: Nurse Practitioner

## 2023-12-11 ENCOUNTER — Other Ambulatory Visit: Payer: Self-pay

## 2023-12-11 DIAGNOSIS — D5700 Hb-SS disease with crisis, unspecified: Secondary | ICD-10-CM

## 2023-12-11 DIAGNOSIS — G894 Chronic pain syndrome: Secondary | ICD-10-CM

## 2023-12-11 NOTE — Telephone Encounter (Signed)
 Please advise [facility]

## 2023-12-12 ENCOUNTER — Other Ambulatory Visit: Payer: Self-pay | Admitting: Nurse Practitioner

## 2023-12-12 ENCOUNTER — Other Ambulatory Visit: Payer: Self-pay

## 2023-12-12 DIAGNOSIS — D5700 Hb-SS disease with crisis, unspecified: Secondary | ICD-10-CM

## 2023-12-12 DIAGNOSIS — G894 Chronic pain syndrome: Secondary | ICD-10-CM

## 2023-12-12 NOTE — Telephone Encounter (Signed)
 This encounter was created in error - please disregard.

## 2023-12-12 NOTE — Telephone Encounter (Unsigned)
 Copied from CRM [id number]. Topic: Clinical - Medication Refill >> Dec 12, 2023  3:20 PM Sasha H wrote: Medication:  HYDROmorphone  (DILAUDID ) 4 MG tablet  Has the patient contacted their pharmacy? Yes (Agent: If no, request that the patient contact the pharmacy for the refill. If patient does not wish to contact the pharmacy document the reason why and proceed with request.) (Agent: If yes, when and what did the pharmacy advise?)  This is the patient's preferred pharmacy:  [facility] [address] [place] [address] Phone: [phone] Fax: [phone]     Is this the correct pharmacy for this prescription? Yes If no, delete pharmacy and type the correct one.   Has the prescription been filled recently? Yes  Is the patient out of the medication? Yes  Has the patient been seen for an appointment in the last year OR does the patient have an upcoming appointment? Yes  Can we respond through MyChart? Yes  Agent: Please be advised that Rx refills may take up to 3 business days. We ask that you follow-up with your pharmacy.

## 2023-12-13 ENCOUNTER — Other Ambulatory Visit (HOSPITAL_COMMUNITY): Payer: Self-pay

## 2023-12-15 ENCOUNTER — Telehealth: Payer: Self-pay

## 2023-12-15 ENCOUNTER — Other Ambulatory Visit: Payer: Self-pay | Admitting: Internal Medicine

## 2023-12-15 DIAGNOSIS — G894 Chronic pain syndrome: Secondary | ICD-10-CM

## 2023-12-15 DIAGNOSIS — D5700 Hb-SS disease with crisis, unspecified: Secondary | ICD-10-CM

## 2023-12-15 MED ORDER — HYDROMORPHONE HCL 4 MG PO TABS
4.0000 mg | ORAL_TABLET | Freq: Four times a day (QID) | ORAL | 0 refills | Status: DC | PRN
  Filled 2023-12-15 – 2023-12-18 (×3): qty 60, 15d supply, fill #0

## 2023-12-15 NOTE — Telephone Encounter (Unsigned)
 Copied from CRM [id number]. Topic: Clinical - Medication Question >> Dec 15, 2023  2:22 PM Sasha H wrote: Reason for CRM: Pt is wanting to know when HYDROmorphone  (DILAUDID ) 4 MG tablet will be refilled.

## 2023-12-16 ENCOUNTER — Telehealth: Payer: Self-pay | Admitting: *Deleted

## 2023-12-16 ENCOUNTER — Other Ambulatory Visit (HOSPITAL_COMMUNITY): Payer: Self-pay

## 2023-12-16 ENCOUNTER — Other Ambulatory Visit (HOSPITAL_BASED_OUTPATIENT_CLINIC_OR_DEPARTMENT_OTHER): Payer: Self-pay

## 2023-12-16 NOTE — Transitions of Care (Post Inpatient/ED Visit) (Signed)
 12/16/2023  Name: Molly Gutierrez  MRN: [id number] DOB: 08-May-1981  Today's TOC FU Call Status: Today's TOC FU Call Status:: Successful TOC FU Call Completed TOC FU Call Complete Date: 12/16/23 Patient's Name and Date of Birth confirmed.  Transition Care Management Follow-up Telephone Call Date of Discharge: 12/15/23 Discharge Facility: Other ([facility]) [facility] of [facility] Facility: [facility] Type of Discharge: Inpatient Admission Primary Inpatient Discharge Diagnosis:: Sickle Cell Pain Crisis How have you been since you were released from the hospital?: Better (appetite is better, drinking adequate fluids, no issues wtih ambulation) Any questions or concerns?: No  Items Reviewed: Did you receive and understand the discharge instructions provided?: Yes Medications obtained,verified, and reconciled?: Yes (Medications Reviewed) Any new allergies since your discharge?: No Dietary orders reviewed?: Yes Type of Diet Ordered:: heart healthy Do you have support at home?: Yes People in Home [RPT]:  (pt does not share whether she lives alone) Name of Support/Comfort Primary Source: pt states she has support Pain assessment completed Reviewed triggers for sickle cell exacerbation, encouraged pt to stay well hydrated, keep stress to a minimum, avoid extremes of cold and heat, take medications as prescribed  Medications Reviewed Today: Medications Reviewed Today     Reviewed by Aura Mliss LABOR, RN (Registered Nurse) on 12/16/23 at [phone]  Med List Status: <None>   Medication Order Taking? Sig Documenting Provider Last Dose Status Informant  acetaminophen  (TYLENOL ) 500 MG tablet [id number] Yes Take 500 mg by mouth every 6 (six) hours as needed for mild pain or headache (or cramps). [provider]  Active Self  albuterol  (VENTOLIN  HFA) 108 (90 Base) MCG/ACT inhaler [id number] Yes TAKE 2 PUFFS BY MOUTH EVERY 6 HOURS AS NEEDED FOR WHEEZE OR  SHORTNESS OF BREATH Nichols, Tonya S, NP  Active Self  budesonide -formoterol  (SYMBICORT ) 80-4.5 MCG/ACT inhaler [id number] Yes INHALE 2 PUFFS INTO THE LUNGS TWICE A DAY Oley Bascom RAMAN, NP  Active Self  Cholecalciferol  (VITAMIN D3 PO) [id number] Yes Take 1 capsule by mouth at bedtime. [provider]  Active Self           Med Note ALLEGRA, [facility] I   Sat Sep 27, 2023  5:47 AM)    Cyanocobalamin  (VITAMIN B-12 PO) [id number] Yes Take 1 tablet by mouth at bedtime. [provider]  Active Self           Med Note ALLEGRA, [facility] I   Sat Sep 27, 2023  5:47 AM)    Deferiprone  (FERRIPROX ) 500 MG TABS [id number] Yes Take 500 mg by mouth 2 (two) times daily. Oley Bascom RAMAN, NP  Active   diphenhydrAMINE  (BENADRYL ) 25 MG tablet [id number] Yes Take 25 mg by mouth See admin instructions. Take 25 mg by mouth every four to six hours with each dose of Hydromorphone  [provider]  Active Self           Med Note DINO, REGEENA   Thu Oct 05, 2020  5:11 PM)    fluticasone  (FLONASE ) 50 MCG/ACT nasal spray [id number]  Place 1 spray into both nostrils daily as needed for allergies or rhinitis.  Patient not taking: Reported on 12/16/2023   Oley Bascom RAMAN, NP  Expired 05/05/23 2359 Self, Pharmacy Records           Med Note (MARTIN, ANGELENE   Thu Aug 07, 2023  6:09 PM) Medication discontinued  folic acid  (FOLVITE ) 1 MG tablet [id number] Yes Take 1 tablet (1 mg total) by mouth daily. Oley Bascom RAMAN, NP  Active Self  HYDROmorphone  (DILAUDID ) 4 MG tablet [id number] Yes Take 1 tablet (4 mg total) by mouth every 6 (six) hours as needed for severe pain (pain score 7-10). Jegede, Olugbemiga E, MD  Active   mirtazapine  (REMERON ) 45 MG tablet [id number] Yes Take 1 tablet (45 mg total) by mouth at bedtime. Oley Bascom RAMAN, NP  Active Self  naloxone  (NARCAN ) nasal spray 4 mg/0.1 mL [id number] Yes Place 0.1 sprays (0.4 mg total) into the nose once as needed (opioid overdose). Oley Bascom RAMAN,  NP  Active Self           Med Note LORENDA, Finneus Kaneshiro A   Thu Oct 23, 2023 10:47 AM) Has on hand  polyethylene glycol (MIRALAX  / GLYCOLAX ) 17 g packet [id number] Yes Take 17 g by mouth daily as needed for mild constipation (MIX AS DIRECTED AND DRINK). Oley Bascom RAMAN, NP  Active Self           Med Note ALLEGRA, [facility] I   Sun Aug 31, 2023  2:53 AM)    promethazine  (PHENERGAN ) 25 MG tablet [id number] Yes Take 0.5-1 tablets (12.5-25 mg total) by mouth every 6 (six) hours as needed for nausea or vomiting. Oley Bascom RAMAN, NP  Active Self           Med Note ALLEGRA, [facility] I   Sun Aug 31, 2023  2:53 AM)    Vitamin D , Ergocalciferol , (DRISDOL ) 1.25 MG (50000 UNIT) CAPS capsule [id number] Yes Take 1 capsule (50,000 Units total) by mouth once a week. Oley Bascom RAMAN, NP  Active Self  XARELTO  20 MG TABS tablet [id number] Yes Take 1 tablet (20 mg total) by mouth daily with supper. Oley Bascom RAMAN, NP  Active Self  Med List Note Marisa Nathanel SAILOR, CPhT 04/02/22 1640): The k is in the patient's first name is SILENT              Home Care and Equipment/Supplies: Were Home Health Services Ordered?: No Any new equipment or medical supplies ordered?: No  Functional Questionnaire: Do you need assistance with bathing/showering or dressing?: No Do you need assistance with meal preparation?: No Do you need assistance with eating?: No Do you have difficulty maintaining continence: No Do you have difficulty managing or taking your medications?: No  Follow up appointments reviewed: PCP Follow-up appointment confirmed?: Yes Date of PCP follow-up appointment?: 01/08/24 (collaborated with care guide, scheduled post hospital follow up) Follow-up Provider: Bascom Oley NP  @ 9 am, pt verbalizes understanding and declined sooner appointment for 12/17/23 Specialist Hospital Follow-up appointment confirmed?: No Reason Specialist Follow-Up Not Confirmed: Patient has Specialist Provider Number and will Call  for Appointment Do you need transportation to your follow-up appointment?: No Do you understand care options if your condition(s) worsen?: Yes-patient verbalized understanding  SDOH Interventions Today    Flowsheet Row Most Recent Value  SDOH Interventions   Food Insecurity Interventions Intervention Not Indicated  Housing Interventions Intervention Not Indicated  Transportation Interventions Intervention Not Indicated  Utilities Interventions Intervention Not Indicated    Mliss Creed [facility], BSN RN Care Manager/ Transition of Care [place]/ [facility] Population Health [phone]

## 2023-12-17 ENCOUNTER — Other Ambulatory Visit (HOSPITAL_COMMUNITY): Payer: Self-pay

## 2023-12-18 ENCOUNTER — Other Ambulatory Visit (HOSPITAL_COMMUNITY): Payer: Self-pay

## 2023-12-29 ENCOUNTER — Inpatient Hospital Stay (HOSPITAL_COMMUNITY)
Admission: EM | Admit: 2023-12-29 | Discharge: 2024-01-04 | DRG: 812 | Disposition: A | Discharge status: Home / Self Care | Attending: Internal Medicine | Admitting: Internal Medicine

## 2023-12-29 ENCOUNTER — Other Ambulatory Visit: Payer: Self-pay | Admitting: Internal Medicine

## 2023-12-29 ENCOUNTER — Telehealth (HOSPITAL_COMMUNITY): Payer: Self-pay

## 2023-12-29 ENCOUNTER — Other Ambulatory Visit: Payer: Self-pay

## 2023-12-29 ENCOUNTER — Emergency Department (HOSPITAL_COMMUNITY)

## 2023-12-29 DIAGNOSIS — D72829 Elevated white blood cell count, unspecified: Secondary | ICD-10-CM | POA: Diagnosis present

## 2023-12-29 DIAGNOSIS — Z833 Family history of diabetes mellitus: Secondary | ICD-10-CM

## 2023-12-29 DIAGNOSIS — Z7951 Long term (current) use of inhaled steroids: Secondary | ICD-10-CM | POA: Diagnosis not present

## 2023-12-29 DIAGNOSIS — G894 Chronic pain syndrome: Secondary | ICD-10-CM | POA: Diagnosis present

## 2023-12-29 DIAGNOSIS — Z83438 Family history of other disorder of lipoprotein metabolism and other lipidemia: Secondary | ICD-10-CM | POA: Diagnosis not present

## 2023-12-29 DIAGNOSIS — Z881 Allergy status to other antibiotic agents status: Secondary | ICD-10-CM | POA: Diagnosis not present

## 2023-12-29 DIAGNOSIS — D5700 Hb-SS disease with crisis, unspecified: Secondary | ICD-10-CM

## 2023-12-29 DIAGNOSIS — F419 Anxiety disorder, unspecified: Secondary | ICD-10-CM | POA: Diagnosis present

## 2023-12-29 DIAGNOSIS — Z841 Family history of disorders of kidney and ureter: Secondary | ICD-10-CM | POA: Diagnosis not present

## 2023-12-29 DIAGNOSIS — J45909 Unspecified asthma, uncomplicated: Secondary | ICD-10-CM | POA: Diagnosis present

## 2023-12-29 DIAGNOSIS — Z885 Allergy status to narcotic agent status: Secondary | ICD-10-CM

## 2023-12-29 DIAGNOSIS — F32A Depression, unspecified: Secondary | ICD-10-CM | POA: Diagnosis present

## 2023-12-29 DIAGNOSIS — R112 Nausea with vomiting, unspecified: Secondary | ICD-10-CM | POA: Diagnosis present

## 2023-12-29 DIAGNOSIS — Z79899 Other long term (current) drug therapy: Secondary | ICD-10-CM

## 2023-12-29 DIAGNOSIS — E876 Hypokalemia: Secondary | ICD-10-CM | POA: Diagnosis present

## 2023-12-29 DIAGNOSIS — R079 Chest pain, unspecified: Secondary | ICD-10-CM | POA: Diagnosis present

## 2023-12-29 DIAGNOSIS — D638 Anemia in other chronic diseases classified elsewhere: Secondary | ICD-10-CM | POA: Diagnosis present

## 2023-12-29 DIAGNOSIS — D571 Sickle-cell disease without crisis: Secondary | ICD-10-CM | POA: Diagnosis not present

## 2023-12-29 DIAGNOSIS — R918 Other nonspecific abnormal finding of lung field: Secondary | ICD-10-CM | POA: Diagnosis not present

## 2023-12-29 DIAGNOSIS — Z8249 Family history of ischemic heart disease and other diseases of the circulatory system: Secondary | ICD-10-CM | POA: Diagnosis not present

## 2023-12-29 DIAGNOSIS — N6310 Unspecified lump in the right breast, unspecified quadrant: Secondary | ICD-10-CM | POA: Diagnosis not present

## 2023-12-29 DIAGNOSIS — R0789 Other chest pain: Secondary | ICD-10-CM | POA: Diagnosis not present

## 2023-12-29 DIAGNOSIS — Z86711 Personal history of pulmonary embolism: Secondary | ICD-10-CM | POA: Diagnosis present

## 2023-12-29 DIAGNOSIS — Z7901 Long term (current) use of anticoagulants: Secondary | ICD-10-CM

## 2023-12-29 DIAGNOSIS — I517 Cardiomegaly: Secondary | ICD-10-CM | POA: Diagnosis not present

## 2023-12-29 DIAGNOSIS — Z888 Allergy status to other drugs, medicaments and biological substances status: Secondary | ICD-10-CM | POA: Diagnosis not present

## 2023-12-29 LAB — CBC WITH DIFFERENTIAL/PLATELET

## 2023-12-29 MED ORDER — KETOROLAC TROMETHAMINE 15 MG/ML IJ SOLN
15.0000 mg | INTRAMUSCULAR | Status: AC
  Administered 2023-12-29: 15 mg via INTRAVENOUS
  Filled 2023-12-29: qty 1

## 2023-12-29 MED ORDER — HYDROMORPHONE HCL 1 MG/ML IJ SOLN
2.0000 mg | INTRAMUSCULAR | Status: AC
  Administered 2023-12-30: 2 mg via INTRAVENOUS
  Filled 2023-12-29: qty 2

## 2023-12-29 MED ORDER — ONDANSETRON HCL 4 MG/2ML IJ SOLN: 4.0000 mg | INTRAMUSCULAR | Status: DC | PRN

## 2023-12-29 MED ORDER — DIPHENHYDRAMINE HCL 25 MG PO CAPS
25.0000 mg | ORAL_CAPSULE | ORAL | Status: DC | PRN
  Administered 2023-12-30: 25 mg via ORAL
  Filled 2023-12-29: qty 1

## 2023-12-29 NOTE — Telephone Encounter (Signed)
 Copied from CRM [id number]. Topic: Clinical - Medication Refill >> Dec 29, 2023  2:43 PM Carlatta H wrote: Medication: HYDROmorphone  (DILAUDID ) 4 MG tablet  Has the patient contacted their pharmacy? No (Agent: If no, request that the patient contact the pharmacy for the refill. If patient does not wish to contact the pharmacy document the reason why and proceed with request.) (Agent: If yes, when and what did the pharmacy advise?)  This is the patient's preferred pharmacy:    [facility] [id number] [place], [place] [facility] [address] [place] [address] Phone: [phone] Fax: [phone]  Is this the correct pharmacy for this prescription? Yes If no, delete pharmacy and type the correct one.   Has the prescription been filled recently? No  Is the patient out of the medication? Yes  Has the patient been seen for an appointment in the last year OR does the patient have an upcoming appointment? Yes  Can we respond through MyChart? Yes  Agent: Please be advised that Rx refills may take up to 3 business days. We ask that you follow-up with your pharmacy.

## 2023-12-29 NOTE — Telephone Encounter (Signed)
 Pt called the day hospital wanting to come in today for sickle cell pain treatment,Pt reports 9/10 pain to chest (states this is typical for her crisis). Pt reports taking Dilaudid  4 mg on Saturday, and is now out of medication. Pt states that she thinks her prescription is not due until Thursday. Pt reports that she was in the ER this morning, and was discharged around 6 AM. Pt reports that she was treated with IV Dilaudid  1 mg x 3 doses. Pt reports that her temperature in the ER was 99. Pt reports having nausea and vomiting. Per pt she last vomited at 6 AM. Pt denies diarrhea, and abdominal pain. Homer Cover, NP notified. Per provider since pt was just treated in the ER, she can come to the day hospital but will be treated with oral medication. This RN reached out to pts PCP to see when the pts prescription is due for refill, per Bascom Borer, NP she will look into it when she has a chance. Pt notified and verbalized understanding, per pt she will not come to the day hospital today and will wait for her medication to be refilled. Homer Cover, NP notified.

## 2023-12-29 NOTE — ED Provider Triage Note (Signed)
 Emergency Medicine Provider Triage Evaluation Note  Molly Gutierrez  , a 43 y.o. female  was evaluated in triage.  Pt complains of sickle cell pain.  Review of Systems  Positive: Chest pain, nausea, vomiting Negative: Cough, fever, SOB  Physical Exam  BP (!) 107/52 (BP Location: Left Arm)   Pulse 78   Temp 98.9 F (37.2 C) (Oral)   Resp 18   SpO2 100%  Gen:   Awake, no distress    Resp:  Normal effort   MSK:   Moves extremities without difficulty   Other:     Medical Decision Making  Medically screening exam initiated at 9:46 PM.  Appropriate orders placed.  Molly Gutierrez  was informed that the remainder of the evaluation will be completed by another provider, this initial triage assessment does not replace that evaluation, and the importance of remaining in the ED until their evaluation is complete.  Patient with Hgb SS to ED with typical pain of sickle cell in her chest. No cough, fever, SOB. Seen at [facility] today. LABS, CHEST XRAY DONE EARLIER TODAY. She was offered admission but declined because she thought her pain medication prescription was going to be refilled today by her PCP, but it was not. She has ongoing pain prompting return to ED.   Molly Balls, PA-C 12/29/23 2148

## 2023-12-29 NOTE — ED Notes (Signed)
PT request for labs to come from port 

## 2023-12-29 NOTE — ED Triage Notes (Signed)
 Patient c/o sickle cell pain with nausea and vomiting.

## 2023-12-29 NOTE — ED Provider Notes (Signed)
 [facility] Emergency Department Note  Chief Complaint:  Chest Pain (Midline chest pain x 1 week. HX sickle cell crisis. Denies SOB/fever. Patient states she ran out of pain meds 1 week ago. )    History of Present Illness   Molly Gutierrez  is a 43 y.o. female with history of hemoglobin SS, pulmonary emboli and anticoagulated on Xarelto , avascular necrosis of left hip, acute chest syndrome, chronic pain syndrome and opiate dependence, here for evaluation of chest pain.  Patient states for last 1 week she has had increasing chest pain, but denies associated shortness of breath, has not had fevers.  She states that she has not had productive cough or hemoptysis.  She states that she ran out of of pain medicine approximately 1 week ago, and has not taken any other pain medicines prior to arrival.  Independent Historian: Patient   Physical Exam   BP 115/81   Pulse 77   Temp 99 F (37.2 C) (Oral)   Resp 12   Wt 56.2 kg (123 lb 14.4 oz)   LMP 12/08/2023   SpO2 94%   BMI 18.30 kg/m    PHYSICAL EXAM: GEN: well-appearing, NAD EYES: Mild conjunctival pallor noted with mild scleral icterus HEENT: MMM NECK: supple, no LAD PULM: CTAB, no wheezes/rales/rhonchi CV: RRR, no m/r/g ABD: soft, NBS, NTND, no guarding/rebound NEURO: awake and alert, moving all extremities well MSK: no BLE edem  SKIN: warm, dry Procedures   Procedures    ED Course & Medical Decision Making     Reassessment:  ED Course as of 12/29/23 0339  Mon Dec 29, 2023  0324 Patient in continued pain, plan repeat dose of Dilaudid . [[place]]  0339 I discussed with patient, hemoglobin of 8.1 consistent with her baseline, chest x-ray reassuring.  She is unsure regarding whether she will be able to go home or would like to be admitted, we discussed pending improvement upon this next/third dose of Dilaudid , we will determine disposition. [[place]]    ED Course User Index [] Alyce MALVA Clap, MD    Independent  Interpretation of Studies: X-rays; plain film upon my review without pulmonary edema, appears unchanged compared to previous  External Records Reviewed: Inpatient notes; discharge summary from June 30 from Dr. Liddie, reviewed by myself, patient aware that with a sickle cell pain crisis, maintained on Dilaudid  as an inpatient and  discharged on Dilaudid  tablets  Discussion with other Services: None  Escalation of Care, Including Admission/Obs considered but not performed: N/A        Patient is a 43 year old with hemoglobin SS, complex previous medical history and complications, here for evaluation of chest pain consistent with her previous pain crises, but with reassuring review of systems.  She has stable vitals here, benign cardiopulmonary exam, is afebrile.  EKG and troponin are reassuring, I do not suspect ACS/MI.  CBC with minimal leukocytosis, anemia within her baseline with adequate reticulocyte count, I do not suspect an aplastic crisis.  CMP is unremarkable.  Will plan IV fluids, IV Dilaudid , plan reassessment, unclear disposition.  Clinical Impression & Disposition   1. Chest pain, unspecified type      2. History of sickle cell disease         Disposition:  Data Unavailable     ED Prescriptions   None        Past Contributory History  Reviewed from chart  Allergies: Ceclor, Hydroxyurea, Ketamine , and Omeprazole    Personal History: Medical History[1]  Surgical History[2] Family History[3]  Social History[4]  Home Medications: Current Outpatient Medications  Medication Instructions  . albuterol  HFA (PROVENTIL  HFA;VENTOLIN  HFA;PROAIR  HFA) 90 mcg/actuation inhaler 2 puffs, Every 6 hours PRN  . budesonide -formoteroL  (SYMBICORT ;BREYNA ) 80-4.5 mcg/actuation inhaler 2 puffs, inhalation, 2 times daily  . cholecalciferol  (VITAMIN D3) 1,000 Units, Every evening  . cyanocobalamin  (VITAMIN B12) 1,000 mcg, Every evening  . deferiprone  1,000 mg, oral, Every evening  .  diphenhydrAMINE  (BENADRYL ) 25 mg, Every 4 hours PRN  . ergocalciferol  (VITAMIN D2) 50,000 Units, oral, Weekly  . folic acid  (FOLVITE ) 1 mg, oral, Daily  . mirtazapine  (REMERON ) 45 mg, At bedtime  . mometasone -formoterol  (DULERA ) 50-5 mcg/actuation HFAA inhaler 2 puffs, 2 times daily  . naloxone  (NARCAN ) 0.4 mg, nasal, As needed  . promethazine  (PHENERGAN ) 25 mg, As needed  . senna (SENOKOT) 17.2 mg, oral, Daily  . Xarelto  20 mg, Daily with dinner     Results  Labs: Labs Reviewed  COMPREHENSIVE METABOLIC PANEL - Abnormal      Result Value   Sodium 139     Potassium 3.2 (*)    Chloride 106     CO2 26     Anion Gap 7     Glucose, Random 95     Blood Urea Nitrogen (BUN) 5 (*)    Creatinine 0.58     eGFR >90     Albumin 4.0     Total Protein 7.0     Bilirubin, Total 3.3 (*)    Alkaline Phosphatase (ALP) 50     Aspartate Aminotransferase (AST) 34     Alanine Aminotransferase (ALT) 12     Calcium 8.2 (*)    BUN/Creatinine Ratio      CBC WITH DIFFERENTIAL - Abnormal   WBC 12.53 (*)    Monocyte Distribution Width 19.9     RBC 2.47 (*)    Hemoglobin 8.1 (*)    Hematocrit 22 (*)    Mean Corpuscular Volume (MCV) 91     Mean Corpuscular Hemoglobin (MCH) 33     Mean Corpuscular Hemoglobin Conc (MCHC) 36 (*)    Red Cell Distribution Width (RDW) 23.6 (*)    Platelet Count (PLT) 406 (*)    Mean Platelet Volume (MPV) 7.6     Neutrophils % 60     Lymphocytes % 24     Monocytes % 13     Eosinophils % 2     Basophils % 2     nRBC % 1 (*)    Neutrophils Absolute 7.50     Lymphocytes # 3.00     Monocytes # 1.60 (*)    Eosinophils # 0.20     Basophils # 0.20 (*)    nRBC Absolute 0.12     RBC & PLT Morphology Reviewed     Anisocytosis 1+     Poikilocytes 1+     Polychromasia 1+     Sickle Cells Present     Target Cells 1+     Platelet Estimate Adequate    TROPONIN, HIGH SENSITIVE X 2 (0 HOUR BASELINE + 2 HOUR) - Normal   Troponin, High Sensitive <6     Troponin, High  Sensitive Interpretation Normal     Narrative:    The testing method is an immunoassay manufactured by [employer] performed on [employer].  Values obtained with different assay methods may be different and cannot be used interchangeably. High Sensitive Troponin specificity:  Troponin I  TROPONIN, HIGH SENSITIVE, 2HR RFX  RETICULOCYTE COUNT  Imaging: XR Chest 2 Views  Final Result by Carlin Peggye Marikay DOUGLAS, MD (07/14 0117)  DATE OF SERVICE:  12/29/2023 1:14 am    EXAM:   CHEST, 2 VIEWS    CLINICAL HISTORY:  Chest pain    COMPARISON:  No Comparison.    FINDINGS:  Mild increased interstitial markings noted bilaterally, nonspecific. No   dense airspace consolidation. Costophrenic angles are sharp. Cardiac   silhouette is borderline enlarged. Bilateral central venous catheters   again noted in unchanged position. Visualized upper abdomen unremarkable.   Mild degenerative changes within spine.    IMPRESSION:  1. Mild increased interstitial markings bilaterally, nonspecific.   Correlate clinically for mild airway infectious/inflammatory change.  2. No dense airspace consolidation.      THIS IS AN ELECTRONICALLY VERIFIED FINAL REPORT  12/29/2023 1:17 AM - Electronically signed by Bebe Marikay   Workstation: [id number]  [facility]       EKG: Encounter Date: 12/29/23  ECG 12 lead   Narrative                          [facility] ED                                                                                Test Date     2023-12-29 Molly Gutierrez      Department    UNIVED Patient ID    [id number]               Room           Gender        Female                   Technician    WILL  G  DOB           1981/04/11               Requested By  ALYCE CLAP Order Number  [id number]               Reading MD    ALYCE CLAP                                  Measurements Intervals                               Axis           Rate          79                       P             62 PR            154                      QRS           27 QRSD  80                       T             32 QT            422                                     QTc           483                                                                Interpretive Statements Normal sinus rhythm hr 79 nml pr-qrs Low voltage QRS Nonspecific T wave abnormality Prolonged QT no stemi no changes when compared to ekg 12-10-23 I have personally reviewed the tracing and my findings are listed above Alyce Clap on 12-29-2023 1 14 22  EDT     ED Meds Given: Medications  HYDROmorphone  (DILAUDID ) 1 mg/mL injection 0.5 mg (has no administration in time range)  diphenhydrAMINE  (BENADRYL ) tablet/capsule 50 mg (has no administration in time range)  HYDROmorphone  (DILAUDID ) 1 mg/mL injection 0.5 mg (0.5 mg intravenous Given 12/29/23 0150)  lactated ringer 's (bolus) bolus 1,000 mL (1,000 mL intravenous New Bag 12/29/23 0149)     Clinical Decision Support:         Glasgow Coma Scale Score: 15                                           Alyce MALVA Clap [facility]  Department of Emergency Medicine   Time Seen     Event Date/Time   First Provider Evaluation 12/29/23 0111              [1] Past Medical History: Diagnosis Date  . Abscess of liver (CMD)    Liver abscess  . Asthma (CMD)    Asthma  . 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 Calculus of bile duct with other cholecystitis    Choledocholithiasis with chronic cholecystitis  . Ectopic pregnancy    Ectopic pregnancy  . Normal pregnancy    NORMAL PREGNANCY  . Obs blood clot embolism/del w compl    Pulmonary embolism, blood-clot, obstetric, delivered/postpartum condit  . Sickle cell anemia    (CMD)   [2] Past Surgical History: Procedure Laterality Date  . CHOLECYSTECTOMY  june 2012  . DILATION AND EVACUATION     . TOTAL HIP ARTHROPLASTY  2014  [3] Family History Problem Relation Name Age of Onset  . Kidney disease Mother    . Diabetes type II Maternal Grandmother    . Glaucoma Mother    . Heart attack Mother    . Hyperlipidemia Father    . Hypertension Mother    . Hypertension Father    [4] Social History Tobacco Use  . Smoking status: Never    Passive exposure: Never  . Smokeless tobacco: Never  Vaping Use  . Vaping status: Never Used  Substance Use Topics  . Alcohol use: No  . Drug use:  Never    Comment: Drug use: Denies

## 2023-12-30 ENCOUNTER — Encounter (HOSPITAL_COMMUNITY): Payer: Self-pay

## 2023-12-30 ENCOUNTER — Emergency Department (HOSPITAL_COMMUNITY)

## 2023-12-30 DIAGNOSIS — Z83438 Family history of other disorder of lipoprotein metabolism and other lipidemia: Secondary | ICD-10-CM | POA: Diagnosis not present

## 2023-12-30 DIAGNOSIS — E876 Hypokalemia: Secondary | ICD-10-CM

## 2023-12-30 DIAGNOSIS — R112 Nausea with vomiting, unspecified: Secondary | ICD-10-CM | POA: Diagnosis present

## 2023-12-30 DIAGNOSIS — R079 Chest pain, unspecified: Secondary | ICD-10-CM | POA: Diagnosis not present

## 2023-12-30 DIAGNOSIS — Z86711 Personal history of pulmonary embolism: Secondary | ICD-10-CM | POA: Diagnosis not present

## 2023-12-30 DIAGNOSIS — Z841 Family history of disorders of kidney and ureter: Secondary | ICD-10-CM | POA: Diagnosis not present

## 2023-12-30 DIAGNOSIS — Z881 Allergy status to other antibiotic agents status: Secondary | ICD-10-CM | POA: Diagnosis not present

## 2023-12-30 DIAGNOSIS — D5700 Hb-SS disease with crisis, unspecified: Secondary | ICD-10-CM

## 2023-12-30 DIAGNOSIS — Z79899 Other long term (current) drug therapy: Secondary | ICD-10-CM | POA: Diagnosis not present

## 2023-12-30 DIAGNOSIS — Z888 Allergy status to other drugs, medicaments and biological substances status: Secondary | ICD-10-CM | POA: Diagnosis not present

## 2023-12-30 DIAGNOSIS — F419 Anxiety disorder, unspecified: Secondary | ICD-10-CM | POA: Diagnosis present

## 2023-12-30 DIAGNOSIS — Z7901 Long term (current) use of anticoagulants: Secondary | ICD-10-CM | POA: Diagnosis not present

## 2023-12-30 DIAGNOSIS — Z885 Allergy status to narcotic agent status: Secondary | ICD-10-CM | POA: Diagnosis not present

## 2023-12-30 DIAGNOSIS — G894 Chronic pain syndrome: Secondary | ICD-10-CM | POA: Diagnosis present

## 2023-12-30 DIAGNOSIS — F32A Depression, unspecified: Secondary | ICD-10-CM | POA: Diagnosis present

## 2023-12-30 DIAGNOSIS — Z7951 Long term (current) use of inhaled steroids: Secondary | ICD-10-CM | POA: Diagnosis not present

## 2023-12-30 DIAGNOSIS — D638 Anemia in other chronic diseases classified elsewhere: Secondary | ICD-10-CM | POA: Diagnosis present

## 2023-12-30 DIAGNOSIS — Z8249 Family history of ischemic heart disease and other diseases of the circulatory system: Secondary | ICD-10-CM | POA: Diagnosis not present

## 2023-12-30 DIAGNOSIS — J45909 Unspecified asthma, uncomplicated: Secondary | ICD-10-CM | POA: Diagnosis present

## 2023-12-30 DIAGNOSIS — D72829 Elevated white blood cell count, unspecified: Secondary | ICD-10-CM | POA: Diagnosis present

## 2023-12-30 DIAGNOSIS — Z833 Family history of diabetes mellitus: Secondary | ICD-10-CM | POA: Diagnosis not present

## 2023-12-30 LAB — CBC WITH DIFFERENTIAL/PLATELET
Basophils Relative: 0 K/uL (ref 0.0–0.1)
Eosinophils Absolute: 0 K/uL (ref 0.0–0.5)
Eosinophils Relative: 0.1 K/uL (ref 0.0–0.5)
HCT: 23.5 % — ABNORMAL LOW (ref 36.0–46.0)
Hemoglobin: 8.4 g/dL — ABNORMAL LOW (ref 12.0–15.0)
Immature Granulocytes: 1 K/uL (ref 0.0–0.1)
Lymphocytes Relative: 20 %
Lymphs Abs: 2.9 K/uL (ref 0.7–4.0)
MCH: 32.8 pg (ref 26.0–34.0)
MCHC: 35.7 g/dL (ref 30.0–36.0)
MCV: 91.8 fL (ref 80.0–100.0)
Monocytes Absolute: 1 K/uL (ref 0.1–1.0)
Monocytes Relative: 1.7 K/uL — AB (ref 0.1–1.0)
Monocytes Relative: 12 K/uL (ref 0.7–4.0)
Neutro Abs: 10.1 K/uL — AB (ref 1.7–7.7)
Neutrophils Relative %: 66 %
Platelets: 438 K/uL — ABNORMAL HIGH (ref 150–400)
RBC: 2.56 MIL/uL — ABNORMAL LOW (ref 3.87–5.11)
RDW: 22.6 % — ABNORMAL HIGH (ref 11.5–15.5)
WBC Morphology: 0.09 — AB (ref 0.00–0.07)
WBC: 15 K/uL — ABNORMAL HIGH (ref 4.0–10.5)
nRBC: 0.6 % — ABNORMAL HIGH (ref 0.0–0.2)

## 2023-12-30 LAB — TROPONIN I (HIGH SENSITIVITY)
Troponin I (High Sensitivity): 3 ng/L (ref <18)
Troponin I (High Sensitivity): 3 ng/L (ref <18)

## 2023-12-30 LAB — RETICULOCYTES
Immature Retic Fract: 20.3 % — ABNORMAL HIGH (ref 2.3–15.9)
RBC.: 2.56 MIL/uL — ABNORMAL LOW (ref 3.87–5.11)
Retic Count, Absolute: 567 K/uL — ABNORMAL HIGH (ref 19.0–186.0)
Retic Ct Pct: 21.4 % — ABNORMAL HIGH (ref 0.4–3.1)

## 2023-12-30 LAB — HCG, SERUM, QUALITATIVE: Preg, Serum: NEGATIVE

## 2023-12-30 LAB — COMPREHENSIVE METABOLIC PANEL WITH GFR
ALT: 17 U/L (ref 0–44)
AST: 44 U/L — ABNORMAL HIGH (ref 15–41)
Albumin: 4.3 g/dL (ref 3.5–5.0)
Alkaline Phosphatase: 58 U/L (ref 38–126)
Anion gap: 10 (ref 5–15)
BUN: 5 mg/dL — ABNORMAL LOW (ref 6–20)
CO2: 24 mmol/L (ref 22–32)
Calcium: 9.2 mg/dL (ref 8.9–10.3)
Chloride: 108 mmol/L (ref 98–111)
Creatinine, Ser: 0.47 mg/dL (ref 0.44–1.00)
GFR, Estimated: >60 mL/min (ref >60)
Glucose, Bld: 100 mg/dL — ABNORMAL HIGH (ref 70–99)
Potassium: 3.3 mmol/L — ABNORMAL LOW (ref 3.5–5.1)
Sodium: 142 mmol/L (ref 135–145)
Total Bilirubin: 4.2 mg/dL — ABNORMAL HIGH (ref 0.0–1.2)
Total Protein: 8.4 g/dL — ABNORMAL HIGH (ref 6.5–8.1)

## 2023-12-30 LAB — HIV ANTIBODY (ROUTINE TESTING W REFLEX): HIV Screen 4th Generation wRfx: NONREACTIVE

## 2023-12-30 MED ORDER — SENNOSIDES-DOCUSATE SODIUM 8.6-50 MG PO TABS
1.0000 | ORAL_TABLET | Freq: Two times a day (BID) | ORAL | Status: DC
  Administered 2023-12-30 – 2024-01-04 (×11): 1 via ORAL
  Filled 2023-12-30 (×11): qty 1

## 2023-12-30 MED ORDER — ONDANSETRON HCL 4 MG/2ML IJ SOLN
4.0000 mg | Freq: Four times a day (QID) | INTRAMUSCULAR | Status: DC | PRN
  Administered 2023-12-31 – 2024-01-02 (×4): 4 mg via INTRAVENOUS
  Filled 2023-12-30 (×4): qty 2

## 2023-12-30 MED ORDER — NALOXONE HCL 0.4 MG/ML IJ SOLN: 0.4000 mg | INTRAMUSCULAR | Status: DC | PRN

## 2023-12-30 MED ORDER — IOHEXOL 350 MG/ML SOLN
75.0000 mL | Freq: Once | INTRAVENOUS | Status: AC | PRN
  Administered 2023-12-30: 75 mL via INTRAVENOUS

## 2023-12-30 MED ORDER — KETOROLAC TROMETHAMINE 15 MG/ML IJ SOLN
15.0000 mg | Freq: Four times a day (QID) | INTRAMUSCULAR | Status: AC
  Administered 2023-12-30 – 2024-01-04 (×20): 15 mg via INTRAVENOUS
  Filled 2023-12-30 (×20): qty 1

## 2023-12-30 MED ORDER — HYDROMORPHONE HCL 1 MG/ML IJ SOLN
2.0000 mg | Freq: Once | INTRAMUSCULAR | Status: AC
  Administered 2023-12-30: 2 mg via INTRAVENOUS
  Filled 2023-12-30: qty 2

## 2023-12-30 MED ORDER — ALBUTEROL SULFATE HFA 108 (90 BASE) MCG/ACT IN AERS: 2.0000 | INHALATION_SPRAY | Freq: Four times a day (QID) | RESPIRATORY_TRACT | Status: DC | PRN

## 2023-12-30 MED ORDER — POTASSIUM CHLORIDE CRYS ER 20 MEQ PO TBCR
20.0000 meq | EXTENDED_RELEASE_TABLET | Freq: Once | ORAL | Status: AC
  Administered 2023-12-30: 20 meq via ORAL
  Filled 2023-12-30: qty 2

## 2023-12-30 MED ORDER — POLYETHYLENE GLYCOL 3350 17 G PO PACK
17.0000 g | PACK | Freq: Every day | ORAL | Status: DC | PRN
  Administered 2024-01-01 – 2024-01-02 (×2): 17 g via ORAL
  Filled 2023-12-30 (×2): qty 1

## 2023-12-30 MED ORDER — SODIUM CHLORIDE 0.9% FLUSH
10.0000 mL | Freq: Two times a day (BID) | INTRAVENOUS | Status: DC
  Administered 2023-12-31 – 2024-01-03 (×5): 10 mL

## 2023-12-30 MED ORDER — CHLORHEXIDINE GLUCONATE CLOTH 2 % EX PADS
6.0000 | MEDICATED_PAD | Freq: Every day | CUTANEOUS | Status: DC
  Administered 2023-12-30 – 2024-01-04 (×6): 6 via TOPICAL

## 2023-12-30 MED ORDER — HYDROMORPHONE 1 MG/ML IV SOLN
INTRAVENOUS | Status: DC
  Administered 2023-12-30 (×2): 3.5 mg via INTRAVENOUS
  Administered 2023-12-30: 2 mg via INTRAVENOUS
  Administered 2023-12-30: 30 mg via INTRAVENOUS
  Administered 2023-12-31: 3.5 mg via INTRAVENOUS
  Administered 2023-12-31: 1.5 mg via INTRAVENOUS
  Administered 2023-12-31: 0.5 mg via INTRAVENOUS
  Administered 2023-12-31: 1.5 mg via INTRAVENOUS
  Administered 2023-12-31: 30 mg via INTRAVENOUS
  Administered 2023-12-31: 2.5 mg via INTRAVENOUS
  Administered 2023-12-31 (×2): 1.5 mg via INTRAVENOUS
  Administered 2024-01-01 (×3): 1 mg via INTRAVENOUS
  Administered 2024-01-01: 2.5 mg via INTRAVENOUS
  Administered 2024-01-01: 30 mg via INTRAVENOUS
  Administered 2024-01-02: 1 mg via INTRAVENOUS
  Administered 2024-01-02 (×2): 30 mg via INTRAVENOUS
  Administered 2024-01-02: 3.5 mg via INTRAVENOUS
  Administered 2024-01-02: 1 mg via INTRAVENOUS
  Administered 2024-01-02: 30 mg via INTRAVENOUS
  Administered 2024-01-03: 7 mg via INTRAVENOUS
  Administered 2024-01-03: 30 mg via INTRAVENOUS
  Administered 2024-01-03: 4 mg via INTRAVENOUS
  Administered 2024-01-03: 3.5 mg via INTRAVENOUS
  Administered 2024-01-03: 3 mg via INTRAVENOUS
  Administered 2024-01-03: 6.5 mg via INTRAVENOUS
  Administered 2024-01-04: 2 mg via INTRAVENOUS
  Administered 2024-01-04: 5.5 mg via INTRAVENOUS
  Administered 2024-01-04: 7.5 mg via INTRAVENOUS
  Administered 2024-01-04: 30 mg via INTRAVENOUS
  Filled 2023-12-30 (×5): qty 30

## 2023-12-30 MED ORDER — SODIUM CHLORIDE 0.9% FLUSH: 10.0000 mL | INTRAVENOUS | Status: DC | PRN

## 2023-12-30 MED ORDER — RIVAROXABAN 20 MG PO TABS
20.0000 mg | ORAL_TABLET | Freq: Every day | ORAL | Status: DC
  Administered 2023-12-30 – 2024-01-03 (×5): 20 mg via ORAL
  Filled 2023-12-30 (×5): qty 1

## 2023-12-30 MED ORDER — DIPHENHYDRAMINE HCL 50 MG/ML IJ SOLN
12.5000 mg | Freq: Four times a day (QID) | INTRAMUSCULAR | Status: DC | PRN
  Administered 2023-12-30 – 2024-01-02 (×13): 12.5 mg via INTRAVENOUS
  Filled 2023-12-30 (×13): qty 1

## 2023-12-30 MED ORDER — SODIUM CHLORIDE 0.9% FLUSH: 9.0000 mL | INTRAVENOUS | Status: DC | PRN

## 2023-12-30 MED ORDER — ALBUTEROL SULFATE (2.5 MG/3ML) 0.083% IN NEBU: 2.5000 mg | INHALATION_SOLUTION | Freq: Four times a day (QID) | RESPIRATORY_TRACT | Status: DC | PRN

## 2023-12-30 MED ORDER — FLUTICASONE FUROATE-VILANTEROL 100-25 MCG/ACT IN AEPB
1.0000 | INHALATION_SPRAY | Freq: Every day | RESPIRATORY_TRACT | Status: DC
  Administered 2023-12-30 – 2024-01-03 (×4): 1 via RESPIRATORY_TRACT
  Filled 2023-12-30: qty 28

## 2023-12-30 MED ORDER — DIPHENHYDRAMINE HCL 25 MG PO CAPS
25.0000 mg | ORAL_CAPSULE | ORAL | Status: DC | PRN
Start: 2023-12-30 — End: 2023-12-30

## 2023-12-30 NOTE — Progress Notes (Signed)
   12/30/23 0841  TOC Brief Assessment  Insurance and Status Reviewed  Patient has primary care physician Yes  Home environment has been reviewed home alone  Prior level of function: independent  Prior/Current Home Services No current home services  Social Drivers of Health Review SDOH reviewed no interventions necessary  Readmission risk has been reviewed Yes  Transition of care needs no transition of care needs at this time

## 2023-12-30 NOTE — ED Notes (Signed)
 Patient given gingerale.  Denies other needs at this time.

## 2023-12-30 NOTE — Plan of Care (Signed)

## 2023-12-30 NOTE — Plan of Care (Signed)
  Problem: Bowel/Gastric: Goal: Gut motility will be maintained Outcome: Progressing   Problem: Education: Goal: Awareness of infection prevention will improve Outcome: Progressing

## 2023-12-30 NOTE — H&P (Signed)
 History and Physical    Molly Gutierrez  FMW:[id number] DOB: 05-Jun-1981 DOA: 12/29/2023  PCP: Oley Bascom RAMAN, NP   Patient coming from: Home  Chief Complaint: Pain, N/V    HPI: Molly Gutierrez  is a 43 y.o. female with medical history significant for sickle cell anemia, asthma, and history of PE on Xarelto  who presents with severe pain.  Patient reports worsening chest pain that has become severe.  She states that this pain is the same in character and location her typical sickle cell crisis pain.  She also reports nausea with nonbloody vomiting which she often experiences during sickle cell crisis.  She denies abdominal pain, diarrhea, urinary symptoms, fever, or chills associated with this.  She also denies shortness of breath or cough.  ED Course: Upon arrival to the ED, patient is found to be afebrile and saturating well on room air with normal RR, normal HR, and stable BP.  Labs are most notable for potassium 3.3, normal creatinine, bilirubin 4.2, WBC 15,000, hemoglobin 8.4, and normal troponin.  Patient was treated in the ED with 4 doses of Dilaudid , Toradol , and Benadryl .  Review of Systems:  All other systems reviewed and apart from HPI, are negative.  Past Medical History:  Diagnosis Date   Asthma    Eczema    History of pulmonary embolus (PE)    Sickle cell anemia (HCC)     Past Surgical History:  Procedure Laterality Date   CHOLECYSTECTOMY     ERCP     JOINT REPLACEMENT     PORTA CATH INSERTION     TUBAL LIGATION     WISDOM TOOTH EXTRACTION      Social History:   reports that she has never smoked. She has never used smokeless tobacco. She reports that she does not drink alcohol and does not use drugs.  Allergies  Allergen Reactions   Cefaclor Hives, Swelling and Rash   Hydroxyurea Palpitations and Other (See Comments)    Lowers blood levels and heart rate (causes HYPOtension); it messes me up, it drops my levels and stuff and Hypotension     Omeprazole  Other (See Comments)    Causes sharp pains in the stomach   Ketamine  Palpitations and Other (See Comments)    Pt states she has had previous reaction to ketamine . States she becomes flushed, heart races, dizzy, and feels like she is going to pass out.    Family History  Problem Relation Age of Onset   Renal Disease Mother    Hypertension Mother    High Cholesterol Mother    Heart attack Mother    Diabetes Brother      Prior to Admission medications   Medication Sig Start Date End Date Taking? Authorizing Provider  acetaminophen  (TYLENOL ) 500 MG tablet Take 500 mg by mouth every 6 (six) hours as needed for mild pain or headache (or cramps).   Yes [provider]  albuterol  (VENTOLIN  HFA) 108 (90 Base) MCG/ACT inhaler TAKE 2 PUFFS BY MOUTH EVERY 6 HOURS AS NEEDED FOR WHEEZE OR SHORTNESS OF BREATH 01/22/23  Yes Oley Bascom RAMAN, NP  budesonide -formoterol  (SYMBICORT ) 80-4.5 MCG/ACT inhaler INHALE 2 PUFFS INTO THE LUNGS TWICE A DAY 01/27/23  Yes Nichols, Tonya S, NP  Cholecalciferol  (VITAMIN D3 PO) Take 1 capsule by mouth at bedtime.   Yes [provider]  Cyanocobalamin  (VITAMIN B-12 PO) Take 1 tablet by mouth at bedtime.   Yes [provider]  Deferiprone  (FERRIPROX ) 500 MG TABS Take 500 mg by mouth 2 (  two) times daily. 10/15/23  Yes Oley Bascom RAMAN, NP  diphenhydrAMINE  (BENADRYL ) 25 MG tablet Take 25 mg by mouth See admin instructions. Take 25 mg by mouth every four to six hours with each dose of Hydromorphone    Yes [provider]  folic acid  (FOLVITE ) 1 MG tablet Take 1 tablet (1 mg total) by mouth daily. 12/23/22  Yes Oley Bascom RAMAN, NP  HYDROmorphone  (DILAUDID ) 4 MG tablet Take 1 tablet (4 mg total) by mouth every 6 (six) hours as needed for severe pain (pain score 7-10). 12/15/23  Yes Jegede, Olugbemiga E, MD  mirtazapine  (REMERON ) 45 MG tablet Take 1 tablet (45 mg total) by mouth at bedtime. 12/23/22 12/30/23 Yes Oley Bascom RAMAN, NP   polyethylene glycol (MIRALAX  / GLYCOLAX ) 17 g packet Take 17 g by mouth daily as needed for mild constipation (MIX AS DIRECTED AND DRINK). 12/23/22  Yes Oley Bascom RAMAN, NP  promethazine  (PHENERGAN ) 25 MG tablet Take 0.5-1 tablets (12.5-25 mg total) by mouth every 6 (six) hours as needed for nausea or vomiting. 08/06/23 08/05/24 Yes Oley Bascom RAMAN, NP  XARELTO  20 MG TABS tablet Take 1 tablet (20 mg total) by mouth daily with supper. 09/17/23 09/16/24 Yes Oley Bascom RAMAN, NP  fluticasone  (FLONASE ) 50 MCG/ACT nasal spray Place 1 spray into both nostrils daily as needed for allergies or rhinitis. Patient not taking: Reported on 12/16/2023 12/31/21 05/05/23  Oley Bascom RAMAN, NP  naloxone  (NARCAN ) nasal spray 4 mg/0.1 mL Place 0.1 sprays (0.4 mg total) into the nose once as needed (opioid overdose). 12/23/22   Oley Bascom RAMAN, NP  Oxycodone  HCl 10 MG TABS Take 10 mg by mouth every 4 (four) hours as needed. 11/08/23   [provider]    Physical Exam: Vitals:   12/30/23 0036 12/30/23 0045 12/30/23 0100 12/30/23 0211  BP: 117/84 120/86 106/77   Pulse: 70 74 76   Resp: 16     Temp:    98.3 F (36.8 C)  TempSrc:    Axillary  SpO2: 93% 93% 92%     Constitutional: NAD, no pallor or diaphoresis   Eyes: PERTLA, lids and conjunctivae normal ENMT: Mucous membranes are moist. Posterior pharynx clear of any exudate or lesions.   Neck: supple, no masses  Respiratory: no wheezing, no crackles. No accessory muscle use.  Cardiovascular: S1 & S2 heard, regular rate and rhythm. No extremity edema.   Abdomen: No tenderness, soft. Bowel sounds active.  Musculoskeletal: no clubbing / cyanosis. No joint deformity upper and lower extremities.   Skin: no significant rashes, lesions, ulcers. Warm, dry, well-perfused. Neurologic: CN 2-12 grossly intact. Moving all extremities. Alert and oriented.  Psychiatric: Pleasant. Cooperative.    Labs and Imaging on Admission: I have personally reviewed following labs  and imaging studies  CBC: Recent Labs  Lab 12/29/23 2258  WBC 15.0*  NEUTROABS 10.1*  HGB 8.4*  HCT 23.5*  MCV 91.8  PLT 438*   Basic Metabolic Panel: Recent Labs  Lab 12/29/23 2258  NA 142  K 3.3*  CL 108  CO2 24  GLUCOSE 100*  BUN 5*  CREATININE 0.47  CALCIUM 9.2   GFR: CrCl cannot be calculated (Unknown ideal weight.). Liver Function Tests: Recent Labs  Lab 12/29/23 2258  AST 44*  ALT 17  ALKPHOS 58  BILITOT 4.2*  PROT 8.4*  ALBUMIN 4.3   No results for input(s): LIPASE, AMYLASE in the last 168 hours. No results for input(s): AMMONIA in the last 168 hours. Coagulation Profile:  No results for input(s): INR, PROTIME in the last 168 hours. Cardiac Enzymes: No results for input(s): CKTOTAL, CKMB, CKMBINDEX, TROPONINI in the last 168 hours. BNP (last 3 results) No results for input(s): PROBNP in the last 8760 hours. HbA1C: No results for input(s): HGBA1C in the last 72 hours. CBG: No results for input(s): GLUCAP in the last 168 hours. Lipid Profile: No results for input(s): CHOL, HDL, LDLCALC, TRIG, CHOLHDL, LDLDIRECT in the last 72 hours. Thyroid Function Tests: No results for input(s): TSH, T4TOTAL, FREET4, T3FREE, THYROIDAB in the last 72 hours. Anemia Panel: Recent Labs    12/29/23 2258  RETICCTPCT 21.4*   Urine analysis:    Component Value Date/Time   COLORURINE YELLOW 05/06/2023 0026   APPEARANCEUR CLEAR 05/06/2023 0026   LABSPEC 1.014 05/06/2023 0026   PHURINE 5.0 05/06/2023 0026   GLUCOSEU NEGATIVE 05/06/2023 0026   HGBUR NEGATIVE 05/06/2023 0026   BILIRUBINUR NEGATIVE 05/06/2023 0026   BILIRUBINUR neg 08/03/2020 1429   KETONESUR NEGATIVE 05/06/2023 0026   PROTEINUR NEGATIVE 05/06/2023 0026   UROBILINOGEN 1.0 08/03/2020 1429   NITRITE NEGATIVE 05/06/2023 0026   LEUKOCYTESUR SMALL (A) 05/06/2023 0026   Sepsis Labs: @LABRCNTIP (procalcitonin:4,lacticidven:4) )No results found for this or  any previous visit (from the past 240 hours).   Radiological Exams on Admission: CT Angio Chest PE W and/or Wo Contrast Result Date: 12/30/2023 CLINICAL DATA:  High probability pulmonary embolism, sickle cell disease EXAM: CT ANGIOGRAPHY CHEST WITH CONTRAST TECHNIQUE: Multidetector CT imaging of the chest was performed using the standard protocol during bolus administration of intravenous contrast. Multiplanar CT image reconstructions and MIPs were obtained to evaluate the vascular anatomy. RADIATION DOSE REDUCTION: This exam was performed according to the departmental dose-optimization program which includes automated exposure control, adjustment of the mA and/or kV according to patient size and/or use of iterative reconstruction technique. CONTRAST:  75mL OMNIPAQUE  IOHEXOL  350 MG/ML SOLN COMPARISON:  03/17/2021 FINDINGS: Cardiovascular: Adequate opacification of the pulmonary arterial tree. No intraluminal filling defect identified to suggest acute pulmonary embolism. Central pulmonary arteries are of normal caliber. No significant coronary artery calcification. Cardiac size is mildly enlarged, unchanged. No pericardial effusion. Right internal jugular chest port and left internal jugular chest port tips are seen within the deep right atrium. No significant atherosclerotic calcification within the thoracic aorta. No aortic aneurysm. Mediastinum/Nodes: No enlarged mediastinal, hilar, or axillary lymph nodes. Thyroid gland, trachea, and esophagus demonstrate no significant findings. Lungs/Pleura: There is mild subpleural consolidation within the right lung base laterally which may reflect infiltrate related to pulmonary infarct and/or acute chest syndrome. Mild bibasilar dependent atelectasis. No pneumothorax or pleural effusion. No central obstructing lesion. 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 Status post cholecystectomy. No acute abnormality. Musculoskeletal:  Mixed lytic and sclerotic pattern throughout the visualized axial skeleton is in keeping changes of sickle cell disease. Partially calcified mass noted within the right breast stable since prior examination, but not assessed on this exam. Review of the MIP images confirms the above findings. IMPRESSION: 1. No acute pulmonary embolism. 2. Mild subpleural consolidation within the right lung base laterally which may reflect infiltrate related to pulmonary infarct and/or acute chest syndrome. Electronically Signed   By: Dorethia Molt M.D.   On: 12/30/2023 01:45   DG Chest 2 View Result Date: 12/30/2023 CLINICAL DATA:  Chest pain and sickle cell crisis EXAM: CHEST - 2 VIEW COMPARISON:  08/31/2023 FINDINGS: Cardiac shadow is mildly enlarged but stable. Bilateral chest wall ports are seen. The  lungs are well aerated bilaterally. Some calcifications are noted over the right lung base which project in the breast tissue on the lateral projection. This is stable from the prior exam. No focal infiltrate or effusion is seen. IMPRESSION: No active cardiopulmonary disease. Electronically Signed   By: Oneil Devonshire M.D.   On: 12/30/2023 00:29    EKG: Independently reviewed. Sinus tachycardia, rate 117.   Assessment/Plan   1. Sickle cell anemia with pain crisis  - There was question of possible acute chest syndrome raised on CT but there is no fever, hypoxia, tachypnea, or tachycardia and patient reports that her symptoms are typical of her pain crises    - Hgb appears close to baseline  - Schedule Toradol , start Dilaudid  PCA with close monitoring    2. Hx of PE  - Xarelto     3. Hypokalemia  - Replacing    DVT prophylaxis: Xarelto   Code Status: Full  Level of Care: Level of care: Med-Surg Family Communication: none present  Disposition Plan:  Patient is from: home  Anticipated d/c is to: Home  Anticipated d/c date is: 01/02/24 Patient currently: pending pain-control  Consults called: None  Admission  status: Inpatient     Evalene GORMAN Sprinkles, MD [facility]  12/30/2023, 3:33 AM

## 2023-12-30 NOTE — Progress Notes (Addendum)
 Patient ID: Molly Gutierrez , female   DOB: 01/10/1981, 43 y.o.   MRN: [id number] Subjective: Molly Gutierrez  is a 43 y.o. female with medical history significant for sickle cell anemia, asthma, and history of PE on Xarelto  who presents with severe pain in her chest, back with nausea and vomiting.  Patient continues to endorse pain of 7/10 with improved nausea and no vomiting today. She denies fever, headache, shortness of breath, cough, no urinary symptoms.  Objective:  Vital signs in last 24 hours:  Vitals:   12/30/23 0916 12/30/23 1214 12/30/23 1241 12/30/23 1413  BP:  105/71  117/76  Pulse:  79  82  Resp:   12 18  Temp:  98.9 F (37.2 C)  99 F (37.2 C)  TempSrc:  Oral  Oral  SpO2: 92% 92%  94%  Weight:      Height:        Intake/Output from previous day:   Intake/Output Summary (Last 24 hours) at 12/30/2023 1419 Last data filed at 12/30/2023 1100 Gross per 24 hour  Intake 270 ml  Output --  Net 270 ml    Physical Exam: General: Alert, awake, oriented x3, in no acute distress.  HEENT: [place]/AT PEERL, EOMI Neck: Trachea midline,  no masses, no thyromegal,y no JVD, no carotid bruit OROPHARYNX:  Moist, No exudate/ erythema/lesions.  Heart: Regular rate and rhythm, without murmurs, rubs, gallops, PMI non-displaced, no heaves or thrills on palpation.  Lungs: Clear to auscultation, no wheezing or rhonchi noted. No increased vocal fremitus resonant to percussion  Abdomen: Soft, nontender, nondistended, positive bowel sounds, no masses no hepatosplenomegaly noted..  Neuro: No focal neurological deficits noted cranial nerves II through XII grossly intact. DTRs 2+ bilaterally upper and lower extremities. Strength 5 out of 5 in bilateral upper and lower extremities. Musculoskeletal: Lower back/chest tenderness  psychiatric: Patient alert and oriented x3, good insight and cognition, good recent to remote recall. Lymph node survey: No cervical axillary or inguinal  lymphadenopathy noted.  Lab Results:  Basic Metabolic Panel:    Component Value Date/Time   NA 142 12/29/2023 2258   NA 137 12/23/2022 1210   K 3.3 (L) 12/29/2023 2258   CL 108 12/29/2023 2258   CO2 24 12/29/2023 2258   BUN 5 (L) 12/29/2023 2258   BUN 5 (L) 12/23/2022 1210   CREATININE 0.47 12/29/2023 2258   GLUCOSE 100 (H) 12/29/2023 2258   CALCIUM 9.2 12/29/2023 2258   CBC:    Component Value Date/Time   WBC 15.0 (H) 12/29/2023 2258   HGB 8.4 (L) 12/29/2023 2258   HGB 7.9 (L) 12/23/2022 1210   HCT 23.5 (L) 12/29/2023 2258   HCT 24.4 (L) 12/23/2022 1210   PLT 438 (H) 12/29/2023 2258   PLT 554 (H) 12/23/2022 1210   MCV 91.8 12/29/2023 2258   MCV 94 12/23/2022 1210   NEUTROABS 10.1 (H) 12/29/2023 2258   NEUTROABS 10.4 (H) 12/23/2022 1210   LYMPHSABS 2.9 12/29/2023 2258   LYMPHSABS 2.9 12/23/2022 1210   MONOABS 1.7 (H) 12/29/2023 2258   EOSABS 0.1 12/29/2023 2258   EOSABS 0.2 12/23/2022 1210   BASOSABS 0.0 12/29/2023 2258   BASOSABS 0.1 12/23/2022 1210    No results found for this or any previous visit (from the past 240 hours).  Studies/Results: CT Angio Chest PE W and/or Wo Contrast Result Date: 12/30/2023 CLINICAL DATA:  High probability pulmonary embolism, sickle cell disease EXAM: CT ANGIOGRAPHY CHEST WITH CONTRAST TECHNIQUE: Multidetector CT imaging of the chest was performed using the  standard protocol during bolus administration of intravenous contrast. Multiplanar CT image reconstructions and MIPs were obtained to evaluate the vascular anatomy. RADIATION DOSE REDUCTION: This exam was performed according to the departmental dose-optimization program which includes automated exposure control, adjustment of the mA and/or kV according to patient size and/or use of iterative reconstruction technique. CONTRAST:  75mL OMNIPAQUE  IOHEXOL  350 MG/ML SOLN COMPARISON:  03/17/2021 FINDINGS: Cardiovascular: Adequate opacification of the pulmonary arterial tree. No intraluminal  filling defect identified to suggest acute pulmonary embolism. Central pulmonary arteries are of normal caliber. No significant coronary artery calcification. Cardiac size is mildly enlarged, unchanged. No pericardial effusion. Right internal jugular chest port and left internal jugular chest port tips are seen within the deep right atrium. No significant atherosclerotic calcification within the thoracic aorta. No aortic aneurysm. Mediastinum/Nodes: No enlarged mediastinal, hilar, or axillary lymph nodes. Thyroid gland, trachea, and esophagus demonstrate no significant findings. Lungs/Pleura: There is mild subpleural consolidation within the right lung base laterally which may reflect infiltrate related to pulmonary infarct and/or acute chest syndrome. Mild bibasilar dependent atelectasis. No pneumothorax or pleural effusion. No central obstructing lesion. 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 Status post cholecystectomy. No acute abnormality. Musculoskeletal: Mixed lytic and sclerotic pattern throughout the visualized axial skeleton is in keeping changes of sickle cell disease. Partially calcified mass noted within the right breast stable since prior examination, but not assessed on this exam. Review of the MIP images confirms the above findings. IMPRESSION: 1. No acute pulmonary embolism. 2. Mild subpleural consolidation within the right lung base laterally which may reflect infiltrate related to pulmonary infarct and/or acute chest syndrome. Electronically Signed   By: Dorethia Molt M.D.   On: 12/30/2023 01:45   DG Chest 2 View Result Date: 12/30/2023 CLINICAL DATA:  Chest pain and sickle cell crisis EXAM: CHEST - 2 VIEW COMPARISON:  08/31/2023 FINDINGS: Cardiac shadow is mildly enlarged but stable. Bilateral chest wall ports are seen. The lungs are well aerated bilaterally. Some calcifications are noted over the right lung base which project in the breast tissue on  the lateral projection. This is stable from the prior exam. No focal infiltrate or effusion is seen. IMPRESSION: No active cardiopulmonary disease. Electronically Signed   By: Oneil Devonshire M.D.   On: 12/30/2023 00:29    Medications: Scheduled Meds:  Chlorhexidine  Gluconate Cloth  6 each Topical Daily   fluticasone  furoate-vilanterol  1 puff Inhalation Daily   HYDROmorphone    Intravenous Q4H   ketorolac   15 mg Intravenous Q6H   rivaroxaban   20 mg Oral Q supper   senna-docusate  1 tablet Oral BID   sodium chloride  flush  10-40 mL Intracatheter Q12H   Continuous Infusions: PRN Meds:.albuterol , diphenhydrAMINE , naloxone  **AND** sodium chloride  flush, ondansetron  (ZOFRAN ) IV, polyethylene glycol, sodium chloride  flush  Consultants: Neurology  Procedures: None  Antibiotics:       None  Assessment/Plan: Principal Problem:   Sickle cell pain crisis (HCC) Active Problems:   Leukocytosis   History of pulmonary embolism   Hypokalemia   Asthma   Chest pain   Anxiety and depression   Nausea and vomiting   Hb Sickle Cell Disease with Pain crisis: Continue IVF 0.45% Saline @ 125 mls/hour, continue weight based Dilaudid  PCA, IV Toradol  15 mg Q 6 H for a total of 5 days, continue oral home pain medications as ordered. Monitor vitals very closely, Re-evaluate pain scale regularly, 2 L of Oxygen  by [place]. Patient encouraged to ambulate on  the hallway today.  Leukocytosis: WBC slightly elevated, possibly due to  vaso-occlusive crisis.  No acute signs/symptoms of infection.  Will continue to monitor daily CBC. Anemia of Chronic Disease: Hemoglobin 8.4 g/dL.  No need for medical transfusion at this time, we will continue to monitor daily CBC. Chronic pain Syndrome: Continue oral home pain medication. Nausea and vomiting: Gradually improving, continue Zofran  as prescribed. Anxiety and depression: Patient is not on any medication at this time.  Will need follow-up evaluation/management. History of  pulmonary embolism: Continue Xarelto  as prescribed.   Code Status: Full Code Family Communication: N/A Disposition Plan: Not yet ready for discharge  Homer CHRISTELLA Cover NP  If 7PM-7AM, please contact night-coverage.  12/30/2023, 2:19 PM  LOS: 0 days

## 2023-12-30 NOTE — ED Provider Notes (Signed)
  EMERGENCY DEPARTMENT AT [facility] Provider Note   CSN: [id number] Arrival date & time: 12/29/23  2129     Patient presents with: Sickle Cell Pain Crisis   Molly Gutierrez  is a 43 y.o. female.  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  Patient states that she ran her medications approximately 1 week ago.  She was seen yesterday at the facility in [place] for the same complaints and was discharged this morning.  She states that since going home her pain is increased and now rates her pain as severe.  She denies shortness of breath at this time, abdominal pain, extremity pain.    Sickle Cell Pain Crisis      Prior to Admission medications   Medication Sig Start Date End Date Taking? Authorizing Provider  Oxycodone  HCl 10 MG TABS Take 10 mg by mouth every 4 (four) hours as needed. 11/08/23  Yes [provider]  acetaminophen  (TYLENOL ) 500 MG tablet Take 500 mg by mouth every 6 (six) hours as needed for mild pain or headache (or cramps).    [provider]  albuterol  (VENTOLIN  HFA) 108 (90 Base) MCG/ACT inhaler TAKE 2 PUFFS BY MOUTH EVERY 6 HOURS AS NEEDED FOR WHEEZE OR SHORTNESS OF BREATH 01/22/23   Oley Bascom RAMAN, NP  budesonide -formoterol  (SYMBICORT ) 80-4.5 MCG/ACT inhaler INHALE 2 PUFFS INTO THE LUNGS TWICE A DAY 01/27/23   Nichols, Tonya S, NP  Cholecalciferol  (VITAMIN D3 PO) Take 1 capsule by mouth at bedtime.    [provider]  Cyanocobalamin  (VITAMIN B-12 PO) Take 1 tablet by mouth at bedtime.    [provider]  Deferiprone  (FERRIPROX ) 500 MG TABS Take 500 mg by mouth 2 (two) times daily. 10/15/23   Oley Bascom RAMAN, NP  diphenhydrAMINE  (BENADRYL ) 25 MG tablet Take 25 mg by mouth See admin instructions. Take 25 mg by mouth every four to six hours with each dose of  Hydromorphone     [provider]  fluticasone  (FLONASE ) 50 MCG/ACT nasal spray Place 1 spray into both nostrils daily as needed for allergies or rhinitis. Patient not taking: Reported on 12/16/2023 12/31/21 05/05/23  Oley Bascom RAMAN, NP  folic acid  (FOLVITE ) 1 MG tablet Take 1 tablet (1 mg total) by mouth daily. 12/23/22   Oley Bascom RAMAN, NP  HYDROmorphone  (DILAUDID ) 4 MG tablet Take 1 tablet (4 mg total) by mouth every 6 (six) hours as needed for severe pain (pain score 7-10). 12/15/23   Jegede, Olugbemiga E, MD  mirtazapine  (REMERON ) 45 MG tablet Take 1 tablet (45 mg total) by mouth at bedtime. 12/23/22 12/23/23  Oley Bascom RAMAN, NP  naloxone  (NARCAN ) nasal spray 4 mg/0.1 mL Place 0.1 sprays (0.4 mg total) into the nose once as needed (opioid overdose). 12/23/22   Nichols, Tonya S, NP  polyethylene glycol (MIRALAX  / GLYCOLAX ) 17 g packet Take 17 g by mouth daily as needed for mild constipation (MIX AS DIRECTED AND DRINK). 12/23/22   Oley Bascom RAMAN, NP  promethazine  (PHENERGAN ) 25 MG tablet Take 0.5-1 tablets (12.5-25 mg total) by mouth every 6 (six) hours as needed for nausea or vomiting. 08/06/23 08/05/24  Oley Bascom RAMAN, NP  XARELTO  20 MG TABS tablet Take 1 tablet (20 mg total) by mouth daily with supper. 09/17/23 09/16/24  Oley Bascom RAMAN, NP    Allergies: Cefaclor, Hydroxyurea, Omeprazole , and Ketamine   Review of Systems  Updated Vital Signs BP 106/77   Pulse 76   Temp 98.3 F (36.8 C) (Axillary)   Resp 16   LMP  (LMP Unknown)   SpO2 92%   Physical Exam Vitals and nursing note reviewed.  Constitutional:      General: She is not in acute distress.    Appearance: She is well-developed.  HENT:     Head: Normocephalic and atraumatic.  Eyes:     Conjunctiva/sclera: Conjunctivae normal.  Cardiovascular:     Rate and Rhythm: Normal rate and regular rhythm.  Pulmonary:     Effort: Pulmonary effort is normal. No respiratory distress.     Breath sounds: Normal breath sounds.   Abdominal:     Palpations: Abdomen is soft.     Tenderness: There is no abdominal tenderness.  Musculoskeletal:        General: No swelling.     Cervical back: Neck supple.  Skin:    General: Skin is warm and dry.     Capillary Refill: Capillary refill takes less than 2 seconds.  Neurological:     Mental Status: She is alert.  Psychiatric:        Mood and Affect: Mood normal.     (all labs ordered are listed, but only abnormal results are displayed) Labs Reviewed  COMPREHENSIVE METABOLIC PANEL WITH GFR - Abnormal; Notable for the following components:      Result Value   Potassium 3.3 (*)    Glucose, Bld 100 (*)    BUN 5 (*)    Total Protein 8.4 (*)    AST 44 (*)    Total Bilirubin 4.2 (*)    All other components within normal limits  CBC WITH DIFFERENTIAL/PLATELET - Abnormal; Notable for the following components:   WBC 15.0 (*)    RBC 2.56 (*)    Hemoglobin 8.4 (*)    HCT 23.5 (*)    RDW 22.6 (*)    Platelets 438 (*)    nRBC 0.6 (*)    Neutro Abs 10.1 (*)    Monocytes Absolute 1.7 (*)    Abs Immature Granulocytes 0.09 (*)    All other components within normal limits  RETICULOCYTES - Abnormal; Notable for the following components:   Retic Ct Pct 21.4 (*)    RBC. 2.56 (*)    Retic Count, Absolute 567.0 (*)    Immature Retic Fract 20.3 (*)    All other components within normal limits  HCG, SERUM, QUALITATIVE  TROPONIN I (HIGH SENSITIVITY)  TROPONIN I (HIGH SENSITIVITY)    EKG: EKG Interpretation Date/Time:  Tuesday December 30 2023 01:54:41 EDT Ventricular Rate:  117 PR Interval:  138 QRS Duration:  72 QT Interval:  319 QTC Calculation: 445 R Axis:   54  Text Interpretation: Sinus tachycardia Right atrial enlargement Probable anteroseptal infarct, old Borderline repolarization abnormality Since last tracing rate faster Otherwise no significant change Confirmed by Trine Likes [phone]) on 12/30/2023 2:58:59 AM  Radiology: CT Angio Chest PE W and/or Wo  Contrast Result Date: 12/30/2023 CLINICAL DATA:  High probability pulmonary embolism, sickle cell disease EXAM: CT ANGIOGRAPHY CHEST WITH CONTRAST TECHNIQUE: Multidetector CT imaging of the chest was performed using the standard protocol during bolus administration of intravenous contrast. Multiplanar CT image reconstructions and MIPs were obtained to evaluate the vascular anatomy. RADIATION DOSE REDUCTION: This exam was performed according to the departmental dose-optimization program which includes automated exposure control, adjustment of the mA and/or kV according to patient size  and/or use of iterative reconstruction technique. CONTRAST:  75mL OMNIPAQUE  IOHEXOL  350 MG/ML SOLN COMPARISON:  03/17/2021 FINDINGS: Cardiovascular: Adequate opacification of the pulmonary arterial tree. No intraluminal filling defect identified to suggest acute pulmonary embolism. Central pulmonary arteries are of normal caliber. No significant coronary artery calcification. Cardiac size is mildly enlarged, unchanged. No pericardial effusion. Right internal jugular chest port and left internal jugular chest port tips are seen within the deep right atrium. No significant atherosclerotic calcification within the thoracic aorta. No aortic aneurysm. Mediastinum/Nodes: No enlarged mediastinal, hilar, or axillary lymph nodes. Thyroid gland, trachea, and esophagus demonstrate no significant findings. Lungs/Pleura: There is mild subpleural consolidation within the right lung base laterally which may reflect infiltrate related to pulmonary infarct and/or acute chest syndrome. Mild bibasilar dependent atelectasis. No pneumothorax or pleural effusion. No central obstructing lesion. 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 Status post cholecystectomy. No acute abnormality. Musculoskeletal: Mixed lytic and sclerotic pattern throughout the visualized axial skeleton is in keeping changes of sickle cell  disease. Partially calcified mass noted within the right breast stable since prior examination, but not assessed on this exam. Review of the MIP images confirms the above findings. IMPRESSION: 1. No acute pulmonary embolism. 2. Mild subpleural consolidation within the right lung base laterally which may reflect infiltrate related to pulmonary infarct and/or acute chest syndrome. Electronically Signed   By: Dorethia Molt M.D.   On: 12/30/2023 01:45   DG Chest 2 View Result Date: 12/30/2023 CLINICAL DATA:  Chest pain and sickle cell crisis EXAM: CHEST - 2 VIEW COMPARISON:  08/31/2023 FINDINGS: Cardiac shadow is mildly enlarged but stable. Bilateral chest wall ports are seen. The lungs are well aerated bilaterally. Some calcifications are noted over the right lung base which project in the breast tissue on the lateral projection. This is stable from the prior exam. No focal infiltrate or effusion is seen. IMPRESSION: No active cardiopulmonary disease. Electronically Signed   By: Oneil Devonshire M.D.   On: 12/30/2023 00:29     .Critical Care  Performed by: Logan Ubaldo NOVAK, PA-C Authorized by: Logan Ubaldo NOVAK, PA-C   Critical care provider statement:    Critical care time (minutes):  30   Critical care time was exclusive of:  Separately billable procedures and treating other patients and teaching time   Critical care was necessary to treat or prevent imminent or life-threatening deterioration of the following conditions: Sickle cell crisis.   Critical care was time spent personally by me on the following activities:  Development of treatment plan with patient or surrogate, discussions with consultants, evaluation of patient's response to treatment, examination of patient, ordering and review of laboratory studies, ordering and review of radiographic studies, ordering and performing treatments and interventions, pulse oximetry, re-evaluation of patient's condition and review of old charts   Care  discussed with: admitting provider      Medications Ordered in the ED  diphenhydrAMINE  (BENADRYL ) capsule 25-50 mg (25 mg Oral Given 12/30/23 0139)  ondansetron  (ZOFRAN ) injection 4 mg (has no administration in time range)  sodium chloride  flush (NS) 0.9 % injection 10-40 mL (has no administration in time range)  sodium chloride  flush (NS) 0.9 % injection 10-40 mL (has no administration in time range)  Chlorhexidine  Gluconate Cloth 2 % PADS 6 each (has no administration in time range)  HYDROmorphone  (DILAUDID ) injection 2 mg (has no administration in time range)  HYDROmorphone  (DILAUDID ) injection 2 mg (2 mg Intravenous Given 12/30/23 0000)  HYDROmorphone  (  DILAUDID ) injection 2 mg (2 mg Intravenous Given 12/30/23 0041)  HYDROmorphone  (DILAUDID ) injection 2 mg (2 mg Intravenous Given 12/30/23 0135)  ketorolac  (TORADOL ) 15 MG/ML injection 15 mg (15 mg Intravenous Given 12/29/23 2354)  iohexol  (OMNIPAQUE ) 350 MG/ML injection 75 mL (75 mLs Intravenous Contrast Given 12/30/23 0118)                                    Medical Decision Making Amount and/or Complexity of Data Reviewed Labs: ordered. Radiology: ordered.  Risk OTC drugs. Prescription drug management.   This patient presents to the ED for concern of chest pain, this involves an extensive number of treatment options, and is a complaint that carries with it a high risk of complications and morbidity.  The differential diagnosis includes acute chest, PE, sickle cell pain crisis, ACS, others   Co morbidities / Chronic conditions that complicate the patient evaluation  Sickle cell disease, PE history   Additional history obtained:  Additional history obtained from EMR External records from outside source obtained and reviewed including outside hospital notes   Lab Tests:  I Ordered, and personally interpreted labs.  The pertinent results include:  Leukocytosis with a white count of 15,000, absolute reticulocyte count 567,  reticulocyte percentage 21.4, immature reticulocyte fraction 20.3 (worsened from recent labs)   Imaging Studies ordered:  I ordered imaging studies including chest x-ray and CT angio chest PE study I independently visualized and interpreted imaging which showed  1. No acute pulmonary embolism.  2. Mild subpleural consolidation within the right lung base  laterally which may reflect infiltrate related to pulmonary infarct  and/or acute chest syndrome.   I agree with the radiologist interpretation   Cardiac Monitoring: / EKG:  The patient was maintained on a cardiac monitor.  I personally viewed and interpreted the cardiac monitored which showed an underlying rhythm of: Sinus tachycardia   Problem List / ED Course / Critical interventions / Medication management   I ordered medication including Toradol , Dilaudid , Benadryl , Zofran  Reevaluation of the patient after these medicines showed that the patient had improvement in pain when taking the Dilaudid  with immediate return of pain when Dilaudid  wore off I have reviewed the patients home medicines and have made adjustments as needed   Consultations Obtained:  I requested consultation with the hospitalist, Dr. Charlton,  and discussed lab and imaging findings as well as pertinent plan - they recommend: admission  Test / Admission - Considered:  Patient continues to have chest pain in the setting of known sickle cell disease.  CT scans with some concern of infiltrate related to possible acute chest syndrome versus pulmonary infarct in the right lung base.  Patient's labs appeared to be worsened from baseline.  I feel the patient would benefit from admission for further pain control and management.      Final diagnoses:  Sickle cell pain crisis [facility])  Chest pain, unspecified type    ED Discharge Orders     None          Logan Ubaldo KATHEE DEVONNA 12/30/23 0321    Trine Raynell Moder, MD 12/30/23 1845

## 2023-12-31 NOTE — Plan of Care (Signed)

## 2023-12-31 NOTE — Progress Notes (Signed)
 Patient ID: Molly Gutierrez , female   DOB: [birth year], 43 y.o.   MRN: [id number] Subjective: Molly Gutierrez  is a 43 y.o. female with medical history significant for sickle cell anemia, asthma, and history of PE on Xarelto  who presents with severe pain in her chest, back with nausea and vomiting.  Patient rates her pain 7/10 today.  Nausea resolved.  She has no new concerns, she denies fever, headache, shortness of breath, cough, no urinary symptoms.  Objective:  Vital signs in last 24 hours:  Vitals:   12/31/23 0820 12/31/23 1140 12/31/23 1153 12/31/23 1154  BP:   94/69 94/71  Pulse:   76 87  Resp: 11 10 14    Temp:   98.3 F (36.8 C)   TempSrc:   Oral   SpO2:   91% 93%  Weight:      Height:        Intake/Output from previous day:   Intake/Output Summary (Last 24 hours) at 12/31/2023 1228 Last data filed at 12/31/2023 0900 Gross per 24 hour  Intake 405.5 ml  Output --  Net 405.5 ml    Physical Exam: General: Alert, awake, oriented x3, in no acute distress.  HEENT: [place]/AT PEERL, EOMI Neck: Trachea midline,  no masses, no thyromegal,y no JVD, no carotid bruit OROPHARYNX:  Moist, No exudate/ erythema/lesions.  Heart: Regular rate and rhythm, without murmurs, rubs, gallops, PMI non-displaced, no heaves or thrills on palpation.  Lungs: Clear to auscultation, no wheezing or rhonchi noted. No increased vocal fremitus resonant to percussion  Abdomen: Soft, nontender, nondistended, positive bowel sounds, no masses no hepatosplenomegaly noted..  Neuro: No focal neurological deficits noted cranial nerves II through XII grossly intact. DTRs 2+ bilaterally upper and lower extremities. Strength 5 out of 5 in bilateral upper and lower extremities. Musculoskeletal: Lower back/chest tenderness  psychiatric: Patient alert and oriented x3, good insight and cognition, good recent to remote recall. Lymph node survey: No cervical axillary or inguinal lymphadenopathy noted.  Lab  Results:  Basic Metabolic Panel:    Component Value Date/Time   NA 142 12/29/2023 2258   NA 137 12/23/2022 1210   K 3.3 (L) 12/29/2023 2258   CL 108 12/29/2023 2258   CO2 24 12/29/2023 2258   BUN 5 (L) 12/29/2023 2258   BUN 5 (L) 12/23/2022 1210   CREATININE 0.47 12/29/2023 2258   GLUCOSE 100 (H) 12/29/2023 2258   CALCIUM 9.2 12/29/2023 2258   CBC:    Component Value Date/Time   WBC 15.0 (H) 12/29/2023 2258   HGB 8.4 (L) 12/29/2023 2258   HGB 7.9 (L) 12/23/2022 1210   HCT 23.5 (L) 12/29/2023 2258   HCT 24.4 (L) 12/23/2022 1210   PLT 438 (H) 12/29/2023 2258   PLT 554 (H) 12/23/2022 1210   MCV 91.8 12/29/2023 2258   MCV 94 12/23/2022 1210   NEUTROABS 10.1 (H) 12/29/2023 2258   NEUTROABS 10.4 (H) 12/23/2022 1210   LYMPHSABS 2.9 12/29/2023 2258   LYMPHSABS 2.9 12/23/2022 1210   MONOABS 1.7 (H) 12/29/2023 2258   EOSABS 0.1 12/29/2023 2258   EOSABS 0.2 12/23/2022 1210   BASOSABS 0.0 12/29/2023 2258   BASOSABS 0.1 12/23/2022 1210    No results found for this or any previous visit (from the past 240 hours).  Studies/Results: CT Angio Chest PE W and/or Wo Contrast Result Date: 12/30/2023 CLINICAL DATA:  High probability pulmonary embolism, sickle cell disease EXAM: CT ANGIOGRAPHY CHEST WITH CONTRAST TECHNIQUE: Multidetector CT imaging of the chest was performed using the standard protocol  during bolus administration of intravenous contrast. Multiplanar CT image reconstructions and MIPs were obtained to evaluate the vascular anatomy. RADIATION DOSE REDUCTION: This exam was performed according to the departmental dose-optimization program which includes automated exposure control, adjustment of the mA and/or kV according to patient size and/or use of iterative reconstruction technique. CONTRAST:  75mL OMNIPAQUE  IOHEXOL  350 MG/ML SOLN COMPARISON:  03/17/2021 FINDINGS: Cardiovascular: Adequate opacification of the pulmonary arterial tree. No intraluminal filling defect identified to  suggest acute pulmonary embolism. Central pulmonary arteries are of normal caliber. No significant coronary artery calcification. Cardiac size is mildly enlarged, unchanged. No pericardial effusion. Right internal jugular chest port and left internal jugular chest port tips are seen within the deep right atrium. No significant atherosclerotic calcification within the thoracic aorta. No aortic aneurysm. Mediastinum/Nodes: No enlarged mediastinal, hilar, or axillary lymph nodes. Thyroid gland, trachea, and esophagus demonstrate no significant findings. Lungs/Pleura: There is mild subpleural consolidation within the right lung base laterally which may reflect infiltrate related to pulmonary infarct and/or acute chest syndrome. Mild bibasilar dependent atelectasis. No pneumothorax or pleural effusion. No central obstructing lesion. 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 Status post cholecystectomy. No acute abnormality. Musculoskeletal: Mixed lytic and sclerotic pattern throughout the visualized axial skeleton is in keeping changes of sickle cell disease. Partially calcified mass noted within the right breast stable since prior examination, but not assessed on this exam. Review of the MIP images confirms the above findings. IMPRESSION: 1. No acute pulmonary embolism. 2. Mild subpleural consolidation within the right lung base laterally which may reflect infiltrate related to pulmonary infarct and/or acute chest syndrome. Electronically Signed   By: Dorethia Molt M.D.   On: 12/30/2023 01:45   DG Chest 2 View Result Date: 12/30/2023 CLINICAL DATA:  Chest pain and sickle cell crisis EXAM: CHEST - 2 VIEW COMPARISON:  08/31/2023 FINDINGS: Cardiac shadow is mildly enlarged but stable. Bilateral chest wall ports are seen. The lungs are well aerated bilaterally. Some calcifications are noted over the right lung base which project in the breast tissue on the lateral projection. This  is stable from the prior exam. No focal infiltrate or effusion is seen. IMPRESSION: No active cardiopulmonary disease. Electronically Signed   By: Oneil Devonshire M.D.   On: 12/30/2023 00:29    Medications: Scheduled Meds:  Chlorhexidine  Gluconate Cloth  6 each Topical Daily   fluticasone  furoate-vilanterol  1 puff Inhalation Daily   HYDROmorphone    Intravenous Q4H   ketorolac   15 mg Intravenous Q6H   rivaroxaban   20 mg Oral Q supper   senna-docusate  1 tablet Oral BID   sodium chloride  flush  10-40 mL Intracatheter Q12H   Continuous Infusions: PRN Meds:.albuterol , diphenhydrAMINE , naloxone  **AND** sodium chloride  flush, ondansetron  (ZOFRAN ) IV, polyethylene glycol, sodium chloride  flush  Consultants: Neurology  Procedures: None  Antibiotics:       None  Assessment/Plan: Principal Problem:   Sickle cell pain crisis (HCC) Active Problems:   Leukocytosis   History of pulmonary embolism   Hypokalemia   Asthma   Chest pain   Anxiety and depression   Nausea and vomiting   Hb Sickle Cell Disease with Pain crisis: Continue IVF 0.45% Saline @KVO , continue weight based Dilaudid  PCA, IV Toradol  15 mg Q 6 H for a total of 5 days, continue oral home pain medications as ordered. Monitor vitals very closely, Re-evaluate pain scale regularly, 2 L of Oxygen  by [place]. Patient encouraged to ambulate on the hallway today.  Leukocytosis: WBC slightly elevated, possibly due to  vaso-occlusive crisis.  No acute signs/symptoms of infection.  Will continue to monitor daily CBC. Anemia of Chronic Disease: Hemoglobin 8.4 g/dL.  No need for medical transfusion at this time, we will continue to monitor daily CBC. Chronic pain Syndrome: Continue oral home pain medication. Nausea and vomiting: Resolved. Anxiety and depression: Patient is not on any medication at this time. Will need follow-up evaluation/management. History of pulmonary embolism: Continue Xarelto  as prescribed.   Code Status: Full  Code Family Communication: N/A Disposition Plan: Not yet ready for discharge  Homer CHRISTELLA Cover NP  If 7PM-7AM, please contact night-coverage.  12/31/2023, 12:28 PM  LOS: 1 day

## 2024-01-01 LAB — CBC
HCT: 18.7 % — ABNORMAL LOW (ref 36.0–46.0)
Hemoglobin: 6.8 g/dL — CL (ref 12.0–15.0)
MCH: 32.4 pg (ref 26.0–34.0)
MCHC: 36.4 g/dL — ABNORMAL HIGH (ref 30.0–36.0)
MCV: 89 fL (ref 80.0–100.0)
Platelets: 420 K/uL — ABNORMAL HIGH (ref 150–400)
RBC: 2.1 MIL/uL — ABNORMAL LOW (ref 3.87–5.11)
RDW: 22.3 % — ABNORMAL HIGH (ref 11.5–15.5)
WBC: 14.4 K/uL — ABNORMAL HIGH (ref 4.0–10.5)
nRBC: 0.9 % — ABNORMAL HIGH (ref 0.0–0.2)

## 2024-01-01 NOTE — Progress Notes (Signed)
 Date and time results received: 01/01/24 0416 (use smartphrase .now to insert current time)  Test: CBC Critical Value: hgb=6.8  Name of Provider Notified: On call MD  Orders Received? Or Actions Taken?: waiting for new orders.

## 2024-01-01 NOTE — Progress Notes (Signed)
 PCA checked at bedside report with Jamice RN  0.5mg  /10 min lockout /3mg  in 1 hour dose limit

## 2024-01-01 NOTE — Plan of Care (Signed)

## 2024-01-01 NOTE — Progress Notes (Cosign Needed)
 Patient ID: Molly Gutierrez , female   DOB: April 29, 1981, 43 y.o.   MRN: [id number] Subjective: Molly Gutierrez  is a 43 y.o. female with medical history significant for sickle cell anemia, asthma, and history of PE on Xarelto  who presents with severe pain in her chest, back with nausea and vomiting.  Patient rates her pain 6/10 today.  Nausea resolved.  She has no new concerns, she denies fever, headache, shortness of breath, cough, no urinary symptoms.  Objective:  Vital signs in last 24 hours:  Vitals:   01/02/24 0400 01/02/24 0500 01/02/24 0800 01/02/24 0802  BP:  103/73  95/77  Pulse:  80  79  Resp: 18 15 12 16   Temp:  98.7 F (37.1 C)  98.7 F (37.1 C)  TempSrc:    Oral  SpO2:  90%  95%  Weight:      Height:        Intake/Output from previous day:  No intake or output data in the 24 hours ending 01/02/24 1026   Physical Exam: General: Alert, awake, oriented x3, in no acute distress.  HEENT: [place]/AT PEERL, EOMI Neck: Trachea midline,  no masses, no thyromegal,y no JVD, no carotid bruit OROPHARYNX:  Moist, No exudate/ erythema/lesions.  Heart: Regular rate and rhythm, without murmurs, rubs, gallops, PMI non-displaced, no heaves or thrills on palpation.  Lungs: Clear to auscultation, no wheezing or rhonchi noted. No increased vocal fremitus resonant to percussion  Abdomen: Soft, nontender, nondistended, positive bowel sounds, no masses no hepatosplenomegaly noted..  Neuro: No focal neurological deficits noted cranial nerves II through XII grossly intact. DTRs 2+ bilaterally upper and lower extremities. Strength 5 out of 5 in bilateral upper and lower extremities. Musculoskeletal: Lower back/chest tenderness  psychiatric: Patient alert and oriented x3, good insight and cognition, good recent to remote recall. Lymph node survey: No cervical axillary or inguinal lymphadenopathy noted.  Lab Results:  Basic Metabolic Panel:    Component Value Date/Time   NA 142  12/29/2023 2258   NA 137 12/23/2022 1210   K 3.3 (L) 12/29/2023 2258   CL 108 12/29/2023 2258   CO2 24 12/29/2023 2258   BUN 5 (L) 12/29/2023 2258   BUN 5 (L) 12/23/2022 1210   CREATININE 0.47 12/29/2023 2258   GLUCOSE 100 (H) 12/29/2023 2258   CALCIUM 9.2 12/29/2023 2258   CBC:    Component Value Date/Time   WBC 15.9 (H) 01/02/2024 0336   HGB 6.7 (LL) 01/02/2024 0336   HGB 7.9 (L) 12/23/2022 1210   HCT 18.6 (L) 01/02/2024 0336   HCT 24.4 (L) 12/23/2022 1210   PLT 437 (H) 01/02/2024 0336   PLT 554 (H) 12/23/2022 1210   MCV 89.4 01/02/2024 0336   MCV 94 12/23/2022 1210   NEUTROABS 10.1 (H) 12/29/2023 2258   NEUTROABS 10.4 (H) 12/23/2022 1210   LYMPHSABS 2.9 12/29/2023 2258   LYMPHSABS 2.9 12/23/2022 1210   MONOABS 1.7 (H) 12/29/2023 2258   EOSABS 0.1 12/29/2023 2258   EOSABS 0.2 12/23/2022 1210   BASOSABS 0.0 12/29/2023 2258   BASOSABS 0.1 12/23/2022 1210    No results found for this or any previous visit (from the past 240 hours).  Studies/Results: No results found.   Medications: Scheduled Meds:  sodium chloride    Intravenous Once   acetaminophen   650 mg Oral Once   Chlorhexidine  Gluconate Cloth  6 each Topical Daily   diphenhydrAMINE   25 mg Oral Once   fluticasone  furoate-vilanterol  1 puff Inhalation Daily   HYDROmorphone    Intravenous  Q4H   ketorolac   15 mg Intravenous Q6H   rivaroxaban   20 mg Oral Q supper   senna-docusate  1 tablet Oral BID   sodium chloride  flush  10-40 mL Intracatheter Q12H   Continuous Infusions: PRN Meds:.albuterol , diphenhydrAMINE , naloxone  **AND** sodium chloride  flush, ondansetron  (ZOFRAN ) IV, polyethylene glycol, sodium chloride  flush  Consultants: None  Procedures: None  Antibiotics:       None  Assessment/Plan: Principal Problem:   Sickle cell pain crisis (HCC) Active Problems:   Leukocytosis   History of pulmonary embolism   Hypokalemia   Asthma   Chest pain   Anxiety and depression   Nausea and  vomiting   Hb Sickle Cell Disease with Pain crisis: Continue IVF 0.45% Saline @KVO , continue weight based Dilaudid  PCA, IV Toradol  15 mg Q 6 H for a total of 5 days, continue oral home pain medications as ordered. Monitor vitals very closely, Re-evaluate pain scale regularly, 2 L of Oxygen  by [place]. Patient encouraged to ambulate on the hallway today.  Leukocytosis: WBC slightly elevated, possibly due to  vaso-occlusive crisis.  No acute signs/symptoms of infection.  Will continue to monitor daily CBC. Anemia of Chronic Disease: Hemoglobin 6.8 g/dL. Lower than patients baseline, will not transfuse at this time,  we will continue to monitor daily CBC. Chronic pain Syndrome: Continue oral home pain medication. Nausea and vomiting: Resolved. Anxiety and depression: Patient is not on any medication at this time. Will need follow-up evaluation/management. History of pulmonary embolism: Continue Xarelto  as prescribed.   Code Status: Full Code Family Communication: N/A Disposition Plan : May be ready for d/c in the AM based on HGB  Homer CHRISTELLA Cover NP  If 7PM-7AM, please contact night-coverage.  01/02/2024, 10:26 AM  LOS: 3 days

## 2024-01-02 LAB — CBC
HCT: 18.6 % — ABNORMAL LOW (ref 36.0–46.0)
Hemoglobin: 6.7 g/dL — CL (ref 12.0–15.0)
MCH: 32.2 pg (ref 26.0–34.0)
MCHC: 36 g/dL (ref 30.0–36.0)
MCV: 89.4 fL (ref 80.0–100.0)
Platelets: 437 K/uL — ABNORMAL HIGH (ref 150–400)
RBC: 2.08 MIL/uL — ABNORMAL LOW (ref 3.87–5.11)
RDW: 24.1 % — ABNORMAL HIGH (ref 11.5–15.5)
WBC: 15.9 K/uL — ABNORMAL HIGH (ref 4.0–10.5)
nRBC: 0.9 % — ABNORMAL HIGH (ref 0.0–0.2)

## 2024-01-02 LAB — PREPARE RBC (CROSSMATCH)

## 2024-01-02 MED ORDER — DIPHENHYDRAMINE HCL 25 MG PO CAPS
25.0000 mg | ORAL_CAPSULE | ORAL | Status: DC | PRN
  Administered 2024-01-02 – 2024-01-03 (×2): 25 mg via ORAL
  Filled 2024-01-02 (×2): qty 1

## 2024-01-02 MED ORDER — DIPHENHYDRAMINE HCL 25 MG PO CAPS: 25.0000 mg | ORAL_CAPSULE | Freq: Once | ORAL | Status: DC

## 2024-01-02 MED ORDER — DIPHENHYDRAMINE HCL 25 MG PO CAPS
25.0000 mg | ORAL_CAPSULE | Freq: Once | ORAL | Status: AC
  Administered 2024-01-02: 25 mg via ORAL
  Filled 2024-01-02: qty 1

## 2024-01-02 MED ORDER — SODIUM CHLORIDE 0.9% IV SOLUTION: Freq: Once | INTRAVENOUS | Status: AC

## 2024-01-02 MED ORDER — ACETAMINOPHEN 325 MG PO TABS
650.0000 mg | ORAL_TABLET | Freq: Once | ORAL | Status: AC
  Administered 2024-01-02: 650 mg via ORAL
  Filled 2024-01-02: qty 2

## 2024-01-02 NOTE — Plan of Care (Signed)

## 2024-01-02 NOTE — Progress Notes (Addendum)
 Patient ID: Molly Gutierrez , female   DOB: 1981/04/01, 43 y.o.   MRN: [id number] Subjective: Molly Gutierrez  is a 43 y.o. female with medical history significant for sickle cell anemia, asthma, and history of PE on Xarelto  who presents with severe pain in her chest, back with nausea and vomiting.  Patient rates her pain 9/10 today.   She has no new concerns, she denies fever, headache, shortness of breath, cough, no urinary symptoms.  Objective:  Vital signs in last 24 hours:  Vitals:   01/02/24 0400 01/02/24 0500 01/02/24 0800 01/02/24 0802  BP:  103/73  95/77  Pulse:  80  79  Resp: 18 15 12 16   Temp:  98.7 F (37.1 C)  98.7 F (37.1 C)  TempSrc:    Oral  SpO2:  90%  95%  Weight:      Height:        Intake/Output from previous day:  No intake or output data in the 24 hours ending 01/02/24 1026   Physical Exam: General: Alert, awake, oriented x3, in no acute distress.  HEENT: [place]/AT PEERL, EOMI Neck: Trachea midline,  no masses, no thyromegal,y no JVD, no carotid bruit OROPHARYNX:  Moist, No exudate/ erythema/lesions.  Heart: Regular rate and rhythm, without murmurs, rubs, gallops, PMI non-displaced, no heaves or thrills on palpation.  Lungs: Clear to auscultation, no wheezing or rhonchi noted. No increased vocal fremitus resonant to percussion  Abdomen: Soft, nontender, nondistended, positive bowel sounds, no masses no hepatosplenomegaly noted..  Neuro: No focal neurological deficits noted cranial nerves II through XII grossly intact. DTRs 2+ bilaterally upper and lower extremities. Strength 5 out of 5 in bilateral upper and lower extremities. Musculoskeletal: Lower back/chest tenderness  psychiatric: Patient alert and oriented x3, good insight and cognition, good recent to remote recall. Lymph node survey: No cervical axillary or inguinal lymphadenopathy noted.  Lab Results:  Basic Metabolic Panel:    Component Value Date/Time   NA 142 12/29/2023 2258   NA  137 12/23/2022 1210   K 3.3 (L) 12/29/2023 2258   CL 108 12/29/2023 2258   CO2 24 12/29/2023 2258   BUN 5 (L) 12/29/2023 2258   BUN 5 (L) 12/23/2022 1210   CREATININE 0.47 12/29/2023 2258   GLUCOSE 100 (H) 12/29/2023 2258   CALCIUM 9.2 12/29/2023 2258   CBC:    Component Value Date/Time   WBC 15.9 (H) 01/02/2024 0336   HGB 6.7 (LL) 01/02/2024 0336   HGB 7.9 (L) 12/23/2022 1210   HCT 18.6 (L) 01/02/2024 0336   HCT 24.4 (L) 12/23/2022 1210   PLT 437 (H) 01/02/2024 0336   PLT 554 (H) 12/23/2022 1210   MCV 89.4 01/02/2024 0336   MCV 94 12/23/2022 1210   NEUTROABS 10.1 (H) 12/29/2023 2258   NEUTROABS 10.4 (H) 12/23/2022 1210   LYMPHSABS 2.9 12/29/2023 2258   LYMPHSABS 2.9 12/23/2022 1210   MONOABS 1.7 (H) 12/29/2023 2258   EOSABS 0.1 12/29/2023 2258   EOSABS 0.2 12/23/2022 1210   BASOSABS 0.0 12/29/2023 2258   BASOSABS 0.1 12/23/2022 1210    No results found for this or any previous visit (from the past 240 hours).  Studies/Results: No results found.   Medications: Scheduled Meds:  sodium chloride    Intravenous Once   acetaminophen   650 mg Oral Once   Chlorhexidine  Gluconate Cloth  6 each Topical Daily   diphenhydrAMINE   25 mg Oral Once   fluticasone  furoate-vilanterol  1 puff Inhalation Daily   HYDROmorphone    Intravenous Q4H  ketorolac   15 mg Intravenous Q6H   rivaroxaban   20 mg Oral Q supper   senna-docusate  1 tablet Oral BID   sodium chloride  flush  10-40 mL Intracatheter Q12H   Continuous Infusions: PRN Meds:.albuterol , diphenhydrAMINE , naloxone  **AND** sodium chloride  flush, ondansetron  (ZOFRAN ) IV, polyethylene glycol, sodium chloride  flush  Consultants: None  Procedures: Blood transfusion  Antibiotics:       None  Assessment/Plan: Principal Problem:   Sickle cell pain crisis (HCC) Active Problems:   Leukocytosis   History of pulmonary embolism   Hypokalemia   Asthma   Chest pain   Anxiety and depression   Nausea and vomiting   Hb  Sickle Cell Disease with Pain crisis: Continue IVF 0.45% Saline @KVO , continue weight based Dilaudid  PCA, IV Toradol  15 mg Q 6 H for a total of 5 days, continue oral home pain medications as ordered. Monitor vitals very closely, Re-evaluate pain scale regularly, 2 L of Oxygen  by [place]. Patient encouraged to ambulate on the hallway today.  Leukocytosis: WBC slightly elevated, possibly due to  vaso-occlusive crisis.  No acute signs/symptoms of infection.  Will continue to monitor daily CBC. Anemia of Chronic Disease: Hemoglobin 6.7 g/dL. Lower than patients baseline, will transfuse 1 unit PRBC. Will continue to monitor daily CBC. Chronic pain Syndrome: Continue oral home pain medication. Nausea and vomiting: Resolved. Anxiety and depression: Patient is not on any medication at this time. Will need follow-up evaluation/management. History of pulmonary embolism: Continue Xarelto  as prescribed.   Code Status: Full Code Family Communication: N/A Disposition Plan : Ready for D/C in the AM after Blood transfusion today  Homer CHRISTELLA Cover NP  If 7PM-7AM, please contact night-coverage.  01/02/2024, 10:26 AM  LOS: 3 days

## 2024-01-03 DIAGNOSIS — D5700 Hb-SS disease with crisis, unspecified: Secondary | ICD-10-CM | POA: Diagnosis not present

## 2024-01-03 LAB — CBC
HCT: 22.5 % — ABNORMAL LOW (ref 36.0–46.0)
Hemoglobin: 8.1 g/dL — ABNORMAL LOW (ref 12.0–15.0)
MCH: 32.1 pg (ref 26.0–34.0)
MCHC: 36 g/dL (ref 30.0–36.0)
MCV: 89.3 fL (ref 80.0–100.0)
Platelets: 428 K/uL — ABNORMAL HIGH (ref 150–400)
RBC: 2.52 MIL/uL — ABNORMAL LOW (ref 3.87–5.11)
RDW: 22.8 % — ABNORMAL HIGH (ref 11.5–15.5)
WBC: 13.3 K/uL — ABNORMAL HIGH (ref 4.0–10.5)
nRBC: 1 % — ABNORMAL HIGH (ref 0.0–0.2)

## 2024-01-03 NOTE — Plan of Care (Signed)
  Problem: Respiratory: Goal: Pulmonary complications will be avoided or minimized Outcome: Progressing Goal: Acute Chest Syndrome will be identified early to prevent complications Outcome: Progressing   Problem: Pain Managment: Goal: General experience of comfort will improve and/or be controlled Outcome: Progressing   Problem: Safety: Goal: Ability to remain free from injury will improve Outcome: Progressing

## 2024-01-03 NOTE — Progress Notes (Signed)
 Patient ID: Molly Gutierrez , female   DOB: Nov 13, 1980, 43 y.o.   MRN: [id number] Subjective: Molly Gutierrez  is a 43 y.o. female with medical history significant for sickle cell anemia, asthma, and history of PE on Xarelto  who was admitted for sickle cell pain crisis on 12/30/2023.  Patient claims she is feeling slightly better but still in significant amount of pain, requesting 1 more day in the hospital.  She was transfused with 1 unit of packed red blood cells yesterday for hemoglobin below her baseline.  She has no new complaint today.  Her pain is still at about 7/10, characterized as throbbing and achy.  She denies any fever, cough, chest pain, shortness of breath, nausea, vomiting, or diarrhea.  No urinary symptoms.  Objective:  Vital signs in last 24 hours:  Vitals:   01/03/24 0726 01/03/24 0901 01/03/24 1202 01/03/24 1237  BP:   110/80   Pulse:   70   Resp:  (!) 9 16 10   Temp:   98.2 F (36.8 C)   TempSrc:      SpO2: 96% 93% 99% 93%  Weight:      Height:        Intake/Output from previous day:   Intake/Output Summary (Last 24 hours) at 01/03/2024 1244 Last data filed at 01/02/2024 1846 Gross per 24 hour  Intake 387.17 ml  Output --  Net 387.17 ml     Physical Exam: General: Alert, awake, oriented x3, in no acute distress.  HEENT: [place]/AT PEERL, EOMI Neck: Trachea midline,  no masses, no thyromegal,y no JVD, no carotid bruit OROPHARYNX:  Moist, No exudate/ erythema/lesions.  Heart: Regular rate and rhythm, without murmurs, rubs, gallops, PMI non-displaced, no heaves or thrills on palpation.  Lungs: Clear to auscultation, no wheezing or rhonchi noted. No increased vocal fremitus resonant to percussion  Abdomen: Soft, nontender, nondistended, positive bowel sounds, no masses no hepatosplenomegaly noted..  Neuro: No focal neurological deficits noted cranial nerves II through XII grossly intact. DTRs 2+ bilaterally upper and lower extremities. Strength 5 out of 5 in  bilateral upper and lower extremities. Musculoskeletal: Lower back/chest tenderness  psychiatric: Patient alert and oriented x3, good insight and cognition, good recent to remote recall. Lymph node survey: No cervical axillary or inguinal lymphadenopathy noted.  Lab Results:  Basic Metabolic Panel:    Component Value Date/Time   NA 142 12/29/2023 2258   NA 137 12/23/2022 1210   K 3.3 (L) 12/29/2023 2258   CL 108 12/29/2023 2258   CO2 24 12/29/2023 2258   BUN 5 (L) 12/29/2023 2258   BUN 5 (L) 12/23/2022 1210   CREATININE 0.47 12/29/2023 2258   GLUCOSE 100 (H) 12/29/2023 2258   CALCIUM 9.2 12/29/2023 2258   CBC:    Component Value Date/Time   WBC 13.3 (H) 01/03/2024 0328   HGB 8.1 (L) 01/03/2024 0328   HGB 7.9 (L) 12/23/2022 1210   HCT 22.5 (L) 01/03/2024 0328   HCT 24.4 (L) 12/23/2022 1210   PLT 428 (H) 01/03/2024 0328   PLT 554 (H) 12/23/2022 1210   MCV 89.3 01/03/2024 0328   MCV 94 12/23/2022 1210   NEUTROABS 10.1 (H) 12/29/2023 2258   NEUTROABS 10.4 (H) 12/23/2022 1210   LYMPHSABS 2.9 12/29/2023 2258   LYMPHSABS 2.9 12/23/2022 1210   MONOABS 1.7 (H) 12/29/2023 2258   EOSABS 0.1 12/29/2023 2258   EOSABS 0.2 12/23/2022 1210   BASOSABS 0.0 12/29/2023 2258   BASOSABS 0.1 12/23/2022 1210    No results found for this  or any previous visit (from the past 240 hours).  Studies/Results: No results found.   Medications: Scheduled Meds:  Chlorhexidine  Gluconate Cloth  6 each Topical Daily   fluticasone  furoate-vilanterol  1 puff Inhalation Daily   HYDROmorphone    Intravenous Q4H   ketorolac   15 mg Intravenous Q6H   rivaroxaban   20 mg Oral Q supper   senna-docusate  1 tablet Oral BID   sodium chloride  flush  10-40 mL Intracatheter Q12H   Continuous Infusions: PRN Meds:.albuterol , diphenhydrAMINE , naloxone  **AND** sodium chloride  flush, ondansetron  (ZOFRAN ) IV, polyethylene glycol, sodium chloride  flush  Consultants: None  Procedures: Blood  transfusion  Antibiotics:       None  Assessment/Plan: Principal Problem:   Sickle cell pain crisis (HCC) Active Problems:   Leukocytosis   History of pulmonary embolism   Hypokalemia   Asthma   Chest pain   Anxiety and depression   Nausea and vomiting  Hb Sickle Cell Disease with Pain crisis: IV fluid at KVO.  Will begin to wean weight based Dilaudid  PCA in anticipation for discharge tomorrow morning, continue IV Toradol  15 mg Q 6 H for a total of 5 days, continue oral home pain medications as ordered. Monitor vitals very closely, Re-evaluate pain scale regularly, 2 L of Oxygen  by . Patient encouraged to ambulate on the hallway today.  Leukocytosis: Improving. No acute signs/symptoms of infection. Will continue to monitor daily CBC. Anemia of Chronic Disease: Hemoglobin has improved to 8.1 from 6.7 after transfusion of 1 unit of packed red blood cell yesterday.  There is no further indication for blood transfusion at this time.  Will continue to monitor very closely. Chronic pain Syndrome: Continue oral home pain medication. Nausea and vomiting: Resolved. Anxiety and depression: Clinically stable.  Patient is not on any medication at this time. Will need follow-up evaluation/management as outpatient. History of pulmonary embolism: Continue Xarelto  as ordered   Code Status: Full Code Family Communication: N/A Disposition Plan : Possible discharge home tomorrow morning 01/04/2024.  Landree Fernholz MD, FACP  If 7PM-7AM, please contact night-coverage.  01/03/2024, 12:44 PM  LOS: 4 days

## 2024-01-04 ENCOUNTER — Other Ambulatory Visit: Payer: Self-pay | Admitting: Internal Medicine

## 2024-01-04 DIAGNOSIS — R079 Chest pain, unspecified: Secondary | ICD-10-CM | POA: Diagnosis not present

## 2024-01-04 DIAGNOSIS — G894 Chronic pain syndrome: Secondary | ICD-10-CM

## 2024-01-04 DIAGNOSIS — D5700 Hb-SS disease with crisis, unspecified: Secondary | ICD-10-CM

## 2024-01-04 LAB — BASIC METABOLIC PANEL WITH GFR
Anion gap: 8 (ref 5–15)
BUN: 9 mg/dL (ref 6–20)
CO2: 25 mmol/L (ref 22–32)
Calcium: 8.4 mg/dL — ABNORMAL LOW (ref 8.9–10.3)
Chloride: 104 mmol/L (ref 98–111)
Creatinine, Ser: 0.55 mg/dL (ref 0.44–1.00)
GFR, Estimated: >60 mL/min (ref >60)
Glucose, Bld: 86 mg/dL (ref 70–99)
Potassium: 4 mmol/L (ref 3.5–5.1)
Sodium: 137 mmol/L (ref 135–145)

## 2024-01-04 LAB — CBC
HCT: 22.5 % — ABNORMAL LOW (ref 36.0–46.0)
Hemoglobin: 7.9 g/dL — ABNORMAL LOW (ref 12.0–15.0)
MCH: 31.6 pg (ref 26.0–34.0)
MCHC: 35.1 g/dL (ref 30.0–36.0)
MCV: 90 fL (ref 80.0–100.0)
Platelets: 438 K/uL — ABNORMAL HIGH (ref 150–400)
RBC: 2.5 MIL/uL — ABNORMAL LOW (ref 3.87–5.11)
RDW: 22.2 % — ABNORMAL HIGH (ref 11.5–15.5)
WBC: 12.2 K/uL — ABNORMAL HIGH (ref 4.0–10.5)
nRBC: 1.2 % — ABNORMAL HIGH (ref 0.0–0.2)

## 2024-01-04 MED ORDER — HYDROMORPHONE HCL 4 MG PO TABS: 4.0000 mg | ORAL_TABLET | Freq: Four times a day (QID) | ORAL | 0 refills | Status: DC | PRN

## 2024-01-04 MED ORDER — HEPARIN SOD (PORK) LOCK FLUSH 100 UNIT/ML IV SOLN
500.0000 [IU] | INTRAVENOUS | Status: AC | PRN
  Administered 2024-01-04: 500 [IU]
  Filled 2024-01-04: qty 5

## 2024-01-04 NOTE — Discharge Summary (Signed)
 Physician Discharge Summary  Molly Gutierrez  FMW:[id number] DOB: Dec 22, 1980 DOA: 12/29/2023  PCP: Oley Bascom RAMAN, NP  Admit date: 12/29/2023  Discharge date: 01/04/2024  Discharge Diagnoses:  Principal Problem:   Sickle cell pain crisis (HCC) Active Problems:   Leukocytosis   History of pulmonary embolism   Hypokalemia   Asthma   Chest pain   Anxiety and depression   Nausea and vomiting   Discharge Condition: Stable  Disposition:   Follow-up Information     Oley Bascom RAMAN, NP. Call in 1 week(s).   Specialties: Pulmonary Disease, Endocrinology Contact information: [address] [place] [address] [phone]                Pt is discharged home in good condition and is to follow up with Oley Bascom RAMAN, NP this week to have labs evaluated. Molly Gutierrez  is instructed to increase activity slowly and balance with rest for the next few days, and use prescribed medication to complete treatment of pain  Diet: Regular Wt Readings from Last 3 Encounters:  12/30/23 55.8 kg  09/27/23 56 kg  08/31/23 56.4 kg    History of present illness:  Molly Gutierrez  is a 43 y.o. female with medical history significant for sickle cell anemia, asthma, and history of PE on Xarelto  who presents with severe pain.   Patient reports worsening chest pain that has become severe.  She states that this pain is the same in character and location her typical sickle cell crisis pain.  She also reports nausea with nonbloody vomiting which she often experiences during sickle cell crisis.  She denies abdominal pain, diarrhea, urinary symptoms, fever, or chills associated with this.  She also denies shortness of breath or cough.   ED Course: Upon arrival to the ED, patient is found to be afebrile and saturating well on room air with normal RR, normal HR, and stable BP.  Labs are most notable for potassium 3.3, normal creatinine, bilirubin 4.2, WBC 15,000, hemoglobin  8.4, and normal troponin.   Patient was treated in the ED with 4 doses of Dilaudid , Toradol , and Benadryl .  Hospital Course:  Patient was admitted for sickle cell pain crisis and managed appropriately with IVF, IV Dilaudid  via PCA and IV Toradol , as well as other adjunct therapies per sickle cell pain management protocols.  Her hemoglobin dropped below her baseline to 6.7 for which she was transfused with 1 unit of packed red blood cells, she tolerated transfusion without reaction.  Hemoglobin improved to 8.1 posttransfusion which is consistent with patient's baseline.  Pain slowly returned to baseline, patient tolerating p.o. intake with no restrictions and as of today she is ambulating well with no significant pain.  Her mild hypokalemia on admission was repleted, potassium was 4.0 today.  Basic metabolic panel essentially all within normal limits.  Patient has remained hemodynamically stable throughout this admission. Patient was therefore discharged home today in a hemodynamically stable condition.   Molly Gutierrez will follow-up with PCP within 1 week of this discharge. Molly Gutierrez was counseled extensively about nonpharmacologic means of pain management, patient verbalized understanding and was appreciative of  the care received during this admission.   We discussed the need for good hydration, monitoring of hydration status, avoidance of heat, cold, stress, and infection triggers. We discussed the need to be adherent with taking Hydrea and other home medications. Patient was reminded of the need to seek medical attention immediately if any symptom of bleeding, anemia, or infection occurs.  Discharge Exam:  Vitals:   01/04/24 1040 01/04/24 1219  BP:  124/71  Pulse:  76  Resp: (!) 9 16  Temp:  98.4 F (36.9 C)  SpO2: 95% 98%   Vitals:   01/04/24 0534 01/04/24 0823 01/04/24 1040 01/04/24 1219  BP: 101/69   124/71  Pulse: 70   76  Resp: 16 (!) 9 (!) 9 16  Temp: 98.6 F (37 C)   98.4 F (36.9  C)  TempSrc:      SpO2: 99% 94% 95% 98%  Weight:      Height:        General appearance : Awake, alert, not in any distress. Speech Clear. Not toxic looking HEENT: Atraumatic and Normocephalic, pupils equally reactive to light and accomodation Neck: Supple, no JVD. No cervical lymphadenopathy.  Chest: Good air entry bilaterally, no added sounds  CVS: S1 S2 regular, no murmurs.  Abdomen: Bowel sounds present, Non tender and not distended with no gaurding, rigidity or rebound. Extremities: B/L Lower Ext shows no edema, both legs are warm to touch Neurology: Awake alert, and oriented X 3, CN II-XII intact, Non focal Skin: No Rash  Discharge Instructions  Discharge Instructions     Diet - low sodium heart healthy   Complete by: As directed    Increase activity slowly   Complete by: As directed       Allergies as of 01/04/2024       Reactions   Cefaclor Hives, Swelling, Rash   Hydroxyurea Palpitations, Other (See Comments)   Lowers blood levels and heart rate (causes HYPOtension); it messes me up, it drops my levels and stuff and Hypotension   Omeprazole  Other (See Comments)   Causes sharp pains in the stomach   Ketamine  Palpitations, Other (See Comments)   Pt states she has had previous reaction to ketamine . States she becomes flushed, heart races, dizzy, and feels like she is going to pass out.        Medication List     STOP taking these medications    Oxycodone  HCl 10 MG Tabs       TAKE these medications    acetaminophen  500 MG tablet Commonly known as: TYLENOL  Take 500 mg by mouth every 6 (six) hours as needed for mild pain or headache (or cramps).   albuterol  108 (90 Base) MCG/ACT inhaler Commonly known as: VENTOLIN  HFA TAKE 2 PUFFS BY MOUTH EVERY 6 HOURS AS NEEDED FOR WHEEZE OR SHORTNESS OF BREATH   Deferiprone  500 MG Tabs Commonly known as: Ferriprox  Take 500 mg by mouth 2 (two) times daily.   diphenhydrAMINE  25 MG tablet Commonly known  as: BENADRYL  Take 25 mg by mouth See admin instructions. Take 25 mg by mouth every four to six hours with each dose of Hydromorphone    fluticasone  50 MCG/ACT nasal spray Commonly known as: FLONASE  Place 1 spray into both nostrils daily as needed for allergies or rhinitis.   folic acid  1 MG tablet Commonly known as: FOLVITE  Take 1 tablet (1 mg total) by mouth daily.   HYDROmorphone  4 MG tablet Commonly known as: Dilaudid  Take 1 tablet (4 mg total) by mouth every 6 (six) hours as needed for severe pain (pain score 7-10).   mirtazapine  45 MG tablet Commonly known as: REMERON  Take 1 tablet (45 mg total) by mouth at bedtime.   naloxone  4 MG/0.1ML Liqd nasal spray kit Commonly known as: NARCAN  Place 0.1 sprays (0.4 mg total) into the nose once as needed (opioid overdose).   polyethylene glycol  17 g packet Commonly known as: MIRALAX  / GLYCOLAX  Take 17 g by mouth daily as needed for mild constipation (MIX AS DIRECTED AND DRINK).   promethazine  25 MG tablet Commonly known as: PHENERGAN  Take 0.5-1 tablets (12.5-25 mg total) by mouth every 6 (six) hours as needed for nausea or vomiting.   Symbicort  80-4.5 MCG/ACT inhaler Generic drug: budesonide -formoterol  INHALE 2 PUFFS INTO THE LUNGS TWICE A DAY   VITAMIN B-12 PO Take 1 tablet by mouth at bedtime.   VITAMIN D3 PO Take 1 capsule by mouth at bedtime.   Xarelto  20 MG Tabs tablet Generic drug: rivaroxaban  Take 1 tablet (20 mg total) by mouth daily with supper.        The results of significant diagnostics from this hospitalization (including imaging, microbiology, ancillary and laboratory) are listed below for reference.    Significant Diagnostic Studies: CT Angio Chest PE W and/or Wo Contrast Result Date: 12/30/2023 CLINICAL DATA:  High probability pulmonary embolism, sickle cell disease EXAM: CT ANGIOGRAPHY CHEST WITH CONTRAST TECHNIQUE: Multidetector CT imaging of the chest was performed using the standard protocol during  bolus administration of intravenous contrast. Multiplanar CT image reconstructions and MIPs were obtained to evaluate the vascular anatomy. RADIATION DOSE REDUCTION: This exam was performed according to the departmental dose-optimization program which includes automated exposure control, adjustment of the mA and/or kV according to patient size and/or use of iterative reconstruction technique. CONTRAST:  75mL OMNIPAQUE  IOHEXOL  350 MG/ML SOLN COMPARISON:  03/17/2021 FINDINGS: Cardiovascular: Adequate opacification of the pulmonary arterial tree. No intraluminal filling defect identified to suggest acute pulmonary embolism. Central pulmonary arteries are of normal caliber. No significant coronary artery calcification. Cardiac size is mildly enlarged, unchanged. No pericardial effusion. Right internal jugular chest port and left internal jugular chest port tips are seen within the deep right atrium. No significant atherosclerotic calcification within the thoracic aorta. No aortic aneurysm. Mediastinum/Nodes: No enlarged mediastinal, hilar, or axillary lymph nodes. Thyroid gland, trachea, and esophagus demonstrate no significant findings. Lungs/Pleura: There is mild subpleural consolidation within the right lung base laterally which may reflect infiltrate related to pulmonary infarct and/or acute chest syndrome. Mild bibasilar dependent atelectasis. No pneumothorax or pleural effusion. No central obstructing lesion. 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 Status post cholecystectomy. No acute abnormality. Musculoskeletal: Mixed lytic and sclerotic pattern throughout the visualized axial skeleton is in keeping changes of sickle cell disease. Partially calcified mass noted within the right breast stable since prior examination, but not assessed on this exam. Review of the MIP images confirms the above findings. IMPRESSION: 1. No acute pulmonary embolism. 2. Mild subpleural  consolidation within the right lung base laterally which may reflect infiltrate related to pulmonary infarct and/or acute chest syndrome. Electronically Signed   By: Dorethia Molt M.D.   On: 12/30/2023 01:45   DG Chest 2 View Result Date: 12/30/2023 CLINICAL DATA:  Chest pain and sickle cell crisis EXAM: CHEST - 2 VIEW COMPARISON:  08/31/2023 FINDINGS: Cardiac shadow is mildly enlarged but stable. Bilateral chest wall ports are seen. The lungs are well aerated bilaterally. Some calcifications are noted over the right lung base which project in the breast tissue on the lateral projection. This is stable from the prior exam. No focal infiltrate or effusion is seen. IMPRESSION: No active cardiopulmonary disease. Electronically Signed   By: Oneil Devonshire M.D.   On: 12/30/2023 00:29    Microbiology: No results found for this or any previous visit (from the past 240  hours).   Labs: Basic Metabolic Panel: Recent Labs  Lab 12/29/23 2258 01/04/24 0331  NA 142 137  K 3.3* 4.0  CL 108 104  CO2 24 25  GLUCOSE 100* 86  BUN 5* 9  CREATININE 0.47 0.55  CALCIUM 9.2 8.4*   Liver Function Tests: Recent Labs  Lab 12/29/23 2258  AST 44*  ALT 17  ALKPHOS 58  BILITOT 4.2*  PROT 8.4*  ALBUMIN 4.3   No results for input(s): LIPASE, AMYLASE in the last 168 hours. No results for input(s): AMMONIA in the last 168 hours. CBC: Recent Labs  Lab 12/29/23 2258 01/01/24 0257 01/02/24 0336 01/03/24 0328 01/04/24 0331  WBC 15.0* 14.4* 15.9* 13.3* 12.2*  NEUTROABS 10.1*  --   --   --   --   HGB 8.4* 6.8* 6.7* 8.1* 7.9*  HCT 23.5* 18.7* 18.6* 22.5* 22.5*  MCV 91.8 89.0 89.4 89.3 90.0  PLT 438* 420* 437* 428* 438*   Cardiac Enzymes: No results for input(s): CKTOTAL, CKMB, CKMBINDEX, TROPONINI in the last 168 hours. BNP: Invalid input(s): POCBNP CBG: No results for input(s): GLUCAP in the last 168 hours.  Time coordinating discharge: 50 minutes  Signed:  Britnie Colville  [facility] 01/04/2024, 1:24 PM

## 2024-01-05 ENCOUNTER — Telehealth: Payer: Self-pay

## 2024-01-05 LAB — BPAM RBC
Blood Product Expiration Date: 202508022359
ISSUE DATE / TIME: 202507181313
Unit Type and Rh: 5100

## 2024-01-05 LAB — TYPE AND SCREEN
ABO/RH(D): A POS
Antibody Screen: NEGATIVE
Unit division: 0

## 2024-01-05 NOTE — Transitions of Care (Post Inpatient/ED Visit) (Signed)
   01/05/2024  Name: Molly Gutierrez  MRN: [id number] DOB: 01/31/1981  Today's TOC FU Call Status: Today's TOC FU Call Status:: Unsuccessful Call (1st Attempt) Unsuccessful Call (1st Attempt) Date: 01/05/24  Attempted to reach the patient regarding the most recent Inpatient/ED visit.  Follow Up Plan: Additional outreach attempts will be made to reach the patient to complete the Transitions of Care (Post Inpatient/ED visit) call.   Arvin Seip RN, BSN, CCM [employer], Population Health Case Manager Phone: [phone]

## 2024-01-06 ENCOUNTER — Telehealth: Payer: Self-pay

## 2024-01-06 NOTE — Transitions of Care (Post Inpatient/ED Visit) (Signed)
   01/06/2024  Name: Rula Keniston  MRN: [id number] DOB: 05/28/1981  Today's TOC FU Call Status: Today's TOC FU Call Status:: Unsuccessful Call (2nd Attempt) Unsuccessful Call (2nd Attempt) Date: 01/06/24  Attempted to reach the patient regarding the most recent Inpatient/ED visit.  Follow Up Plan: Additional outreach attempts will be made to reach the patient to complete the Transitions of Care (Post Inpatient/ED visit) call.   Arvin Seip RN, BSN, CCM [employer], Population Health Case Manager Phone: [phone]

## 2024-01-07 ENCOUNTER — Telehealth: Payer: Self-pay

## 2024-01-07 NOTE — Transitions of Care (Post Inpatient/ED Visit) (Signed)
   01/07/2024  Name: Molly Gutierrez  MRN: [id number] DOB: [birth year]  Today's TOC FU Call Status: Today's TOC FU Call Status:: Unsuccessful Call (3rd Attempt) Unsuccessful Call (3rd Attempt) Date: 01/07/24  Attempted to reach the patient regarding the most recent Inpatient/ED visit.  Follow Up Plan: No further outreach attempts will be made at this time. We have been unable to contact the patient.  Arvin Seip RN, BSN, CCM [employer], Population Health Case Manager Phone: [phone]

## 2024-01-08 ENCOUNTER — Inpatient Hospital Stay: Payer: Self-pay | Admitting: Nurse Practitioner

## 2024-01-09 ENCOUNTER — Other Ambulatory Visit: Payer: Self-pay

## 2024-01-14 DIAGNOSIS — D5700 Hb-SS disease with crisis, unspecified: Secondary | ICD-10-CM | POA: Diagnosis not present

## 2024-01-14 DIAGNOSIS — E876 Hypokalemia: Secondary | ICD-10-CM | POA: Diagnosis not present

## 2024-01-14 DIAGNOSIS — R0789 Other chest pain: Secondary | ICD-10-CM | POA: Diagnosis not present

## 2024-01-14 DIAGNOSIS — R079 Chest pain, unspecified: Secondary | ICD-10-CM | POA: Diagnosis not present

## 2024-01-14 DIAGNOSIS — D57219 Sickle-cell/Hb-C disease with crisis, unspecified: Secondary | ICD-10-CM | POA: Diagnosis not present

## 2024-01-14 NOTE — Progress Notes (Signed)
 Patient identified by [name]  43 y.o. female with a past medical history of hemoglobin SS, pulmonary emboli and left hip, acute chest syndrome, chronic pain syndrome and opiate dependence presenting to the Emergency Department with a chief complaint of chest pain that started yesterday. Patient has a [employer] features editor.   Arley Darral RHODY

## 2024-01-14 NOTE — ED Provider Notes (Signed)
 [facility] Emergency Department Note  Chief Complaint:  Sickle Cell Pain Crisis (Midline chest pain since yesterday. Hx of Sickle Cell Disease.)    History of Present Illness   Molly Gutierrez  is a very pleasant 43 y.o. female with a past medical history of hemoglobin SS, pulmonary emboli and anticoagulated on Xarelto , avascular necrosis of left hip, acute chest syndrome, chronic pain syndrome and opiate dependence presenting to the Emergency Department with a chief complaint of chest pain that started yesterday.  Patient also feels like she is short of breath denies any productive cough or hemoptysis. No other reported symptoms at this time.  PCP Bascom Claudene Borer, FNP   History provided by:  Patient Language interpreter used: No     Review of Systems   All other systems reviewed x 12 are negative except HPI above.   Physical Exam   BP 112/77   Pulse 88   Temp 99.1 F (37.3 C)   Resp 15   Wt 53.3 kg (117 lb 8.1 oz)   LMP 12/16/2023 (Exact Date)   SpO2 97%   BMI 17.35 kg/m    Physical Exam Vitals and nursing note reviewed.  Constitutional:      General: She is not in acute distress.    Appearance: Normal appearance. She is not toxic-appearing.  HENT:     Head: Normocephalic and atraumatic.     Nose: Nose normal.   Eyes:     Conjunctiva/sclera: Conjunctivae normal.    Cardiovascular:     Rate and Rhythm: Regular rhythm. Tachycardia present.     Pulses: Normal pulses.     Heart sounds: Normal heart sounds. No murmur heard.    No friction rub. No gallop.  Pulmonary:     Effort: Pulmonary effort is normal. No respiratory distress.     Breath sounds: Normal breath sounds. No stridor. No wheezing, rhonchi or rales.  Chest:     Chest wall: No tenderness.   Musculoskeletal:     Cervical back: Normal range of motion.   Skin:    General: Skin is warm and dry.   Neurological:     Mental Status: She is alert.   Psychiatric:        Mood and  Affect: Mood normal.     Procedures   Procedures    ED Course & Medical Decision Making   Medical Decision Making Patient presents with pain consistent with prior sickle cell pain crises with chest pain that started yesterday.  See HPI for details.    Further workup included CBC, CMP, troponin, BNP, CTPA, & retic count.  Patient was treated with IVF, IV Dilaudid , and p.o. Benadryl . Additional workup/treatment orders will be placed if/when deemed appropriate based on initial results and ED encounter course.  See reassessment section for further details.  Problems Addressed: Chest pain, unspecified type: complicated acute illness or injury Hypokalemia: complicated acute illness or injury Sickle cell pain crisis    (CMD): complicated acute illness or injury  Amount and/or Complexity of Data Reviewed Labs: ordered. Decision-making details documented in ED Course. Radiology: ordered and independent interpretation performed. Decision-making details documented in ED Course.  Risk OTC drugs. Prescription drug management. Decision regarding hospitalization.     LILLETTE Arthea Sep, PA-C, am the primary clinician of record.  Please see ED course documentation for further interpretation of results and medical decision-making.  Reassessment:  ED Course as of 01/14/24 1850  Wed Jan 14, 2024  1617 Patient noted to have normocytic anemia with  a hemoglobin 7.8 likely secondary to her known sickle cell anemia with elevated reticulocyte count.  Patient's hCG was negative for pregnancy.  Patient noted to have a potassium of 3.3 which was repleted in the emergency department with both oral potassium and magnesium  [ZS]  1618 On serial examination patient reports no improvement of pain with IV meds.  Will order additional IV Dilaudid , IV Toradol , and IV Benadryl  and will reassess patient. [ZS]  1652 On serial examination patient reports some improvement but is requesting additional pain  medication.  I ordered another dose of IV Dilaudid  and will reassess patient. [ZS]  1846 Patient CTPA was unremarkable without evidence of pulmonary embolism, pneumonia, dissection, pneumothorax, aneurysm, acute chest syndrome, or other acute pathology, as interpreted by me.  Official radiology report was reviewed. [ZS]  1846 On serial examination patient reports improvement of symptoms with medications. Plan was made to discharge pt to home with close PCP and hematology f/u. Return precautions were discussed, including fever, worsening pain, or any other changes concerns. Understanding & agreement with this plan were expressed. I discussed H&P, results, MDM, and treatment plan with supervising physician Dr. Redell Cohen, who agrees with my plan. [ZS]    ED Course User Index [ZS] Arthea Lamar Sep, PA-C     Clinical Impression & Disposition   1. Sickle cell pain crisis    (CMD)      2. Chest pain, unspecified type      3. Hypokalemia         Disposition:  Discharge     ED Prescriptions   None         Past Contributory History  Reviewed from chart  Allergies: Ceclor, Hydroxyurea, Ketamine , and Omeprazole    Personal History: Medical History[1]  Surgical History[2] Family History[3]  Social History[4]  Home Medications: Current Outpatient Medications  Medication Instructions  . albuterol  HFA (PROVENTIL  HFA;VENTOLIN  HFA;PROAIR  HFA) 90 mcg/actuation inhaler 2 puffs, Every 6 hours PRN  . budesonide -formoteroL  (SYMBICORT ;BREYNA ) 80-4.5 mcg/actuation inhaler 2 puffs, inhalation, 2 times daily  . cholecalciferol  (VITAMIN D3) 1,000 Units, Every evening  . cyanocobalamin  (VITAMIN B12) 1,000 mcg, Every evening  . deferiprone  1,000 mg, oral, Every evening  . diphenhydrAMINE  (BENADRYL ) 25 mg, Every 4 hours PRN  . ergocalciferol  (VITAMIN D2) 50,000 Units, oral, Weekly  . folic acid  (FOLVITE ) 1 mg, oral, Daily  . mirtazapine  (REMERON ) 45 mg, At bedtime  .  mometasone -formoterol  (DULERA ) 50-5 mcg/actuation HFAA inhaler 2 puffs, 2 times daily  . naloxone  (NARCAN ) 0.4 mg, nasal, As needed  . promethazine  (PHENERGAN ) 25 mg, As needed  . senna (SENOKOT) 17.2 mg, oral, Daily  . Xarelto  20 mg, Daily with dinner     Results  Labs: Labs Reviewed  RETICULOCYTE COUNT - Abnormal      Result Value   Reticulocyte Absolute 186.8 (*)    Reticulocyte % 7.5 (*)    Immature Reticulocyte Fraction 0.7 (*)   COMPREHENSIVE METABOLIC PANEL - Abnormal   Sodium 139     Potassium 3.3 (*)    Chloride 106     CO2 25     Anion Gap 8     Glucose, Random 89     Blood Urea Nitrogen (BUN) 6 (*)    Creatinine 0.54     eGFR >90     Albumin 4.2     Total Protein 7.6     Bilirubin, Total 2.7 (*)    Alkaline Phosphatase (ALP) 54     Aspartate Aminotransferase (AST) 28  Alanine Aminotransferase (ALT) 10     Calcium 8.4 (*)    BUN/Creatinine Ratio      B-TYPE NATRIURETIC PEPTIDE (BNP) - Abnormal   B-Type Natriuretic Peptide (BNP) 100 (*)    Narrative:    The testing method is an immunoassay manufactured by [employer] performed on [employer].  Values obtained with different assay methods may be different and cannot be used interchangeably.  CBC WITH DIFFERENTIAL - Abnormal   WBC 13.21 (*)    Monocyte Distribution Width 21.6 (*)    RBC 2.48 (*)    Hemoglobin 7.8 (*)    Hematocrit 22 (*)    Mean Corpuscular Volume (MCV) 89     Mean Corpuscular Hemoglobin (MCH) 32     Mean Corpuscular Hemoglobin Conc (MCHC) 35     Red Cell Distribution Width (RDW) 20.6 (*)    Platelet Count (PLT) 498 (*)    Mean Platelet Volume (MPV) 8.1     Neutrophils % 61     Lymphocytes % 23     Monocytes % 12     Eosinophils % 2     Basophils % 2     nRBC % 1 (*)    Neutrophils Absolute 8.10     Lymphocytes # 3.00     Monocytes # 1.50 (*)    Eosinophils # 0.20     Basophils # 0.30 (*)    nRBC Absolute 0.07    TROPONIN, HIGH SENSITIVE - Normal   Troponin, High  Sensitive <6     Troponin, High Sensitive Interpretation Normal     Narrative:    The testing method is an immunoassay manufactured by [employer] performed on [employer].  Values obtained with different assay methods may be different and cannot be used interchangeably. High Sensitive Troponin specificity:  Troponin I  MAGNESIUM  - Normal   Magnesium  1.7    HUMAN CHORIONIC GONADOTROPIN  (HCG), QUANTITATIVE   Human Chorionic Gonadotropin  (HCG), Quantitative <0.6     Narrative:    FEMALE:  Non-Pregnant :               <5.0 mIU/mL  Post-menopausal:            <11.6 mIU/mL  Week of Pregnancy    Approximate HCG Range (mIU/mL) <1                           5.0-57.5   1-2                         57.5-575.0 2-3                        115.0-6500.0 3-4                        575.0-13000.0 4-5                      1,150.0-65,000.0 5-6                     13,000.0-130,000.0  6-8                     19,500.0-260,000.0 8-12                    13,000.0-130,000.0  This assay is standardized to the [facility] 5th international standard  for HCG. The testing method is an immunoassay manufactured by [employer] performed on [employer]. Values obtained with different assay methods may be different and cannot be used interchangeably.    Imaging: CT Angio Chest Pulmonary Embolism  Final Result by Rea Rosina Rubenstein, MD (07/30 1722)  DATE OF SERVICE:  01/14/2024 5:05 pm    EXAM:  CT ANGIOGRAPHY OF THE CHEST WITH IV CONTRAST INCLUDING REFORMATTED IMAGES    CLINICAL HISTORY:  Pulmonary embolism (PE) suspected, high prob. Sickle cell pain crisis    COMPARISON:  CT ANGIOGRAM CHEST PULMONARY EMBOLISM, ACC: [id number], dated   2023-11-26 02:51:47;  CT ANGIOGRAM CHEST PULMONARY EMBOLISM, ACC: [id number], dated   2023-08-14 13:00:59;  CT ANGIO CHEST, ACC: [id number], dated 2017-08-01 21:07:53    TECHNIQUE:  Following administration of 63cc Omnipaque  350 IV  contrast, CT angiography   of the chest  was performed including 3D MIP reformations. If this exam   was performed in conjunction with another study in the same setting, the   volume of IV contrast  dictated in each report was the total volume of   contrast administered for the exams.    Dose lowering technique(s) such as automated exposure control, iterative   reconstruction, and mA and/or KV adjustment for patient size was utilized   for this exam.    FINDINGS:  Pulmonary Artery: There is diagnostic opacification of the pulmonary   arterial tree to the level of the subsegmental pulmonary arteries. There   is no filling defect to suggest acute pulmonary embolism.    Heart/Mediastinum: Heart is normal in size. No pericardial effusion.   Unchanged mediastinal lymphadenopathy. Unchanged soft tissue mass right   paraspinal region.    Aorta: There is no significant atherosclerosis of the normal caliber   thoracic aorta.    Lungs/Pleural Spaces: The lungs are clear.  There is no pneumothorax.   There is no pleural effusion.    Upper Abdomen: Unchanged normal appearance of the spleen.    Bones/soft tissues: 2.1 cm enhancing mass in the right breast with   peripheral dense calcification, likely a fibroadenoma. No lytic or blastic   osseous lesions. Diffuse heterogeneity of the bone marrow, unchanged, most   consistent sequelae of sickle cell disease.SABRA    IMPRESSION:  No acute pulmonary embolism        THIS IS AN ELECTRONICALLY VERIFIED FINAL REPORT  01/14/2024 5:22 PM - Electronically signed by Rea Rubenstein   Workstation: [id number]  [facility]       EKG: Encounter Date: 01/14/24  ECG 12 lead   Narrative                          [facility] ED                                                                                Test Date     2024-01-14 Pat Name      [facility]      Department    UNIVED Patient ID    [id number]               Room  Gender        Female                   Technician    RL DOB           1980/08/07               Requested By  DARICE MAINS Order Number  [id number]               Reading MD    Breckon Pav                                  Measurements Intervals                              Axis           Rate          101                      P             79 PR            128                      QRS           57 QRSD          74                       T             54 QT            356                                     QTc           461                                                                Interpretive Statements EKG, as interpreted by me, shows sinus tachycardia with a rate of 101 bpm. There is no sign of any ST segment elevation or depression, RSR prime complex in V2, T wave version in the same lead, no signs of any axis deviation or LVH, intervals normal with a QTc of 461.  No other acute ischemic abnormalities compared with the previous documented EKG from 12-29-2023. I have personally reviewed the tracing and my findings are listed above Breckon Pav on 01-14-2024 16 48 53 EDT     ED Meds Given: Medications  D5 %-0.45 % sodium chloride  infusion (0 mL/hr intravenous Stopped 01/14/24 1846)  HYDROmorphone  (DILAUDID ) 1 mg/mL injection  - ADS Override Pull ( intravenous Not Given 01/14/24 1655)  heparin  flush 100 unit/mL injection 500 Units (has no administration in time range)  lactated ringer 's (bolus) bolus 1,000 mL (0 mL intravenous Stopped 01/14/24 1618)  HYDROmorphone  (DILAUDID ) 1 mg/mL injection 0.5 mg (0.5 mg intravenous Given 01/14/24 1516)  diphenhydrAMINE  (BENADRYL ) injection 12.5 mg (12.5  mg intravenous Given 01/14/24 1515)  HYDROmorphone  (DILAUDID ) 1 mg/mL injection 1 mg (1 mg intravenous Given 01/14/24 1610)  diphenhydrAMINE  (BENADRYL ) injection 12.5 mg (12.5 mg intravenous Given 01/14/24 1609)  ketorolac  (TORADOL ) injection 15 mg (15 mg intravenous Given 01/14/24 1609)  potassium  chloride (KLOR-CON ) packet 40 mEq (40 mEq oral Given 01/14/24 1611)  magnesium  oxide 400 mg (241 mg magnesium ) tablet 800 mg (800 mg oral Given 01/14/24 1611)  HYDROmorphone  (DILAUDID ) 1 mg/mL injection 1 mg (1 mg intravenous Given 01/14/24 1712)  diphenhydrAMINE  (BENADRYL ) injection 12.5 mg (12.5 mg intravenous Given 01/14/24 1841)     Clinical Decision Support:     CHA2DS2-VASc Score: N/A           Glasgow Coma Scale Score: 15    History Score: 0, ECG Score: 0, Age Score: 0, Risk Score: 0, HEAR SCORE: 0, Troponin Score: 0, HEART SCORE: 0                                   This document is generated using voice recognition software and there may be inadvertent errors which are not intentional.  Please contact me with any questions.   Arthea JONELLE Sep, PA-C [facility] Department of Emergency Medicine   Time Seen     Event Date/Time   First Provider Evaluation 01/14/24 1350              [1] Past Medical History: Diagnosis Date  . Abscess of liver (CMD)    Liver abscess  . Asthma (CMD)    Asthma  . 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 Calculus of bile duct with other cholecystitis    Choledocholithiasis with chronic cholecystitis  . Ectopic pregnancy    Ectopic pregnancy  . Normal pregnancy    NORMAL PREGNANCY  . Obs blood clot embolism/del w compl    Pulmonary embolism, blood-clot, obstetric, delivered/postpartum condit  . Sickle cell anemia    (CMD)   [2] Past Surgical History: Procedure Laterality Date  . CHOLECYSTECTOMY  june 2012  . DILATION AND EVACUATION    . TOTAL HIP ARTHROPLASTY  2014  [3] Family History Problem Relation Name Age of Onset  . Kidney disease Mother    . Diabetes type II Maternal Grandmother    . Glaucoma Mother    . Heart attack Mother    . Hyperlipidemia Father    . Hypertension Mother    . Hypertension Father    [4] Social History Tobacco Use  . Smoking status: Never     Passive exposure: Never  . Smokeless tobacco: Never  Vaping Use  . Vaping status: Never Used  Substance Use Topics  . Alcohol use: No  . Drug use: Never    Comment: Drug use: Denies

## 2024-01-15 ENCOUNTER — Emergency Department (HOSPITAL_COMMUNITY)

## 2024-01-15 ENCOUNTER — Other Ambulatory Visit: Payer: Self-pay

## 2024-01-15 ENCOUNTER — Encounter: Payer: Self-pay | Admitting: Nurse Practitioner

## 2024-01-15 ENCOUNTER — Inpatient Hospital Stay: Payer: Self-pay | Admitting: Nurse Practitioner

## 2024-01-15 ENCOUNTER — Emergency Department (HOSPITAL_COMMUNITY)
Admission: EM | Admit: 2024-01-15 | Discharge: 2024-01-15 | Disposition: A | Discharge status: Home / Self Care | Attending: Emergency Medicine | Admitting: Emergency Medicine

## 2024-01-15 ENCOUNTER — Ambulatory Visit (INDEPENDENT_AMBULATORY_CARE_PROVIDER_SITE_OTHER): Payer: Self-pay | Admitting: Nurse Practitioner

## 2024-01-15 ENCOUNTER — Encounter (HOSPITAL_COMMUNITY): Payer: Self-pay

## 2024-01-15 VITALS — BP 103/88 | HR 103 | Temp 99.0°F | Wt 117.6 lb

## 2024-01-15 DIAGNOSIS — Z86711 Personal history of pulmonary embolism: Secondary | ICD-10-CM | POA: Insufficient documentation

## 2024-01-15 DIAGNOSIS — R0789 Other chest pain: Secondary | ICD-10-CM | POA: Diagnosis not present

## 2024-01-15 DIAGNOSIS — D5700 Hb-SS disease with crisis, unspecified: Secondary | ICD-10-CM | POA: Diagnosis not present

## 2024-01-15 DIAGNOSIS — R079 Chest pain, unspecified: Secondary | ICD-10-CM | POA: Diagnosis not present

## 2024-01-15 DIAGNOSIS — Z7901 Long term (current) use of anticoagulants: Secondary | ICD-10-CM | POA: Insufficient documentation

## 2024-01-15 DIAGNOSIS — G894 Chronic pain syndrome: Secondary | ICD-10-CM

## 2024-01-15 DIAGNOSIS — Z452 Encounter for adjustment and management of vascular access device: Secondary | ICD-10-CM | POA: Diagnosis not present

## 2024-01-15 LAB — CBC WITH DIFFERENTIAL/PLATELET
Abs Immature Granulocytes: 0.1 K/uL — ABNORMAL HIGH (ref 0.00–0.07)
Basophils Absolute: 0.1 K/uL (ref 0.0–0.1)
Basophils Relative: 1 %
Eosinophils Absolute: 0.1 K/uL (ref 0.0–0.5)
Eosinophils Relative: 1 %
HCT: 22 % — ABNORMAL LOW (ref 36.0–46.0)
Hemoglobin: 7.6 g/dL — ABNORMAL LOW (ref 12.0–15.0)
Immature Granulocytes: 1 %
Lymphocytes Relative: 15 %
Lymphs Abs: 2.1 K/uL (ref 0.7–4.0)
MCH: 31.3 pg (ref 26.0–34.0)
MCHC: 34.5 g/dL (ref 30.0–36.0)
MCV: 90.5 fL (ref 80.0–100.0)
Monocytes Absolute: 1.9 K/uL — ABNORMAL HIGH (ref 0.1–1.0)
Monocytes Relative: 13 %
Neutro Abs: 9.8 K/uL — ABNORMAL HIGH (ref 1.7–7.7)
Neutrophils Relative %: 69 %
Platelets: 510 K/uL — ABNORMAL HIGH (ref 150–400)
RBC: 2.43 MIL/uL — ABNORMAL LOW (ref 3.87–5.11)
RDW: 19.5 % — ABNORMAL HIGH (ref 11.5–15.5)
WBC: 14.1 K/uL — ABNORMAL HIGH (ref 4.0–10.5)
nRBC: 0.4 % — ABNORMAL HIGH (ref 0.0–0.2)

## 2024-01-15 LAB — COMPREHENSIVE METABOLIC PANEL WITH GFR
ALT: 12 U/L (ref 0–44)
AST: 35 U/L (ref 15–41)
Albumin: 3.9 g/dL (ref 3.5–5.0)
Alkaline Phosphatase: 57 U/L (ref 38–126)
Anion gap: 8 (ref 5–15)
BUN: 7 mg/dL (ref 6–20)
CO2: 22 mmol/L (ref 22–32)
Calcium: 8.5 mg/dL — ABNORMAL LOW (ref 8.9–10.3)
Chloride: 108 mmol/L (ref 98–111)
Creatinine, Ser: 0.49 mg/dL (ref 0.44–1.00)
GFR, Estimated: >60 mL/min (ref >60)
Glucose, Bld: 94 mg/dL (ref 70–99)
Potassium: 3.4 mmol/L — ABNORMAL LOW (ref 3.5–5.1)
Sodium: 138 mmol/L (ref 135–145)
Total Bilirubin: 3.4 mg/dL — ABNORMAL HIGH (ref 0.0–1.2)
Total Protein: 7.9 g/dL (ref 6.5–8.1)

## 2024-01-15 LAB — RETICULOCYTES
Immature Retic Fract: 19.9 % — ABNORMAL HIGH (ref 2.3–15.9)
RBC.: 2.5 MIL/uL — ABNORMAL LOW (ref 3.87–5.11)
Retic Count, Absolute: 334 K/uL — ABNORMAL HIGH (ref 19.0–186.0)
Retic Ct Pct: 13.4 % — ABNORMAL HIGH (ref 0.4–3.1)

## 2024-01-15 LAB — TROPONIN I (HIGH SENSITIVITY)
Troponin I (High Sensitivity): 2 ng/L (ref <18)
Troponin I (High Sensitivity): <2 ng/L (ref <18)

## 2024-01-15 MED ORDER — HYDROMORPHONE HCL 2 MG PO TABS
4.0000 mg | ORAL_TABLET | Freq: Once | ORAL | Status: AC
  Administered 2024-01-15: 4 mg via ORAL
  Filled 2024-01-15: qty 2

## 2024-01-15 MED ORDER — ONDANSETRON HCL 4 MG/2ML IJ SOLN: 4.0000 mg | INTRAMUSCULAR | Status: DC | PRN

## 2024-01-15 MED ORDER — HEPARIN SOD (PORK) LOCK FLUSH 100 UNIT/ML IV SOLN
INTRAVENOUS | Status: AC
  Filled 2024-01-15: qty 5

## 2024-01-15 MED ORDER — ACETAMINOPHEN 325 MG PO TABS
650.0000 mg | ORAL_TABLET | Freq: Once | ORAL | Status: AC
  Administered 2024-01-15: 650 mg via ORAL
  Filled 2024-01-15: qty 2

## 2024-01-15 MED ORDER — HYDROMORPHONE HCL 4 MG PO TABS: 4.0000 mg | ORAL_TABLET | Freq: Four times a day (QID) | ORAL | 0 refills | Status: DC | PRN

## 2024-01-15 MED ORDER — DIPHENHYDRAMINE HCL 25 MG PO CAPS
25.0000 mg | ORAL_CAPSULE | ORAL | Status: DC | PRN
  Administered 2024-01-15: 25 mg via ORAL
  Filled 2024-01-15 (×2): qty 1

## 2024-01-15 MED ORDER — HYDROMORPHONE HCL 1 MG/ML IJ SOLN
2.0000 mg | INTRAMUSCULAR | Status: AC
  Administered 2024-01-15: 2 mg via INTRAVENOUS
  Filled 2024-01-15: qty 2

## 2024-01-15 MED ORDER — KETOROLAC TROMETHAMINE 15 MG/ML IJ SOLN
15.0000 mg | INTRAMUSCULAR | Status: AC
  Administered 2024-01-15: 15 mg via INTRAVENOUS
  Filled 2024-01-15: qty 1

## 2024-01-15 NOTE — ED Notes (Signed)
 Pt wants PORT accessed for lab work.

## 2024-01-15 NOTE — Progress Notes (Signed)
 Subjective   Patient ID: Molly Gutierrez , female    DOB: June 15, 1981, 43 y.o.   MRN: [id number]  Chief Complaint  Patient presents with   Hospitalization Follow-up    Referring provider: Oley Bascom RAMAN, NP  Kaylea Wyse  is a 43 y.o. female with Past Medical History: No date: Asthma No date: Eczema No date: History of pulmonary embolus (PE) No date: Sickle cell anemia (HCC)   HPI  Patient presents today for follow-up visit.  She [facility]  She states that she is still having chest pain and elevated heart rate.  We discussed that we will take her to the emergency room here at [facility] today for further evaluation.  She states that her last pain pill was yesterday.  Blood pressure is low in office today.  Heart rate is elevated. Denies f/c/s, n/v/d, hemoptysis, PND, leg swelling Denies chest pain or edema     Allergies  Allergen Reactions   Cefaclor Hives, Swelling and Rash   Hydroxyurea Palpitations and Other (See Comments)    Lowers blood levels and heart rate (causes HYPOtension); it messes me up, it drops my levels and stuff and Hypotension    Omeprazole  Other (See Comments)    Causes sharp pains in the stomach   Ketamine  Palpitations and Other (See Comments)    Pt states she has had previous reaction to ketamine . States she becomes flushed, heart races, dizzy, and feels like she is going to pass out.    Immunization History  Administered Date(s) Administered   Influenza, Seasonal, Injecte, Preservative Fre 03/05/2023   Pfizer Covid-19 Vaccine Bivalent Booster 72yrs & up 09/25/2019, 10/16/2019   Tdap 07/09/2019    Tobacco History: Social History   Tobacco Use  Smoking Status Never  Smokeless Tobacco Never   Counseling given: Not Answered   Outpatient Encounter Medications as of 01/15/2024  Medication Sig   acetaminophen  (TYLENOL ) 500 MG tablet Take 500 mg by mouth  every 6 (six) hours as needed for mild pain or headache (or cramps).   albuterol  (VENTOLIN  HFA) 108 (90 Base) MCG/ACT inhaler TAKE 2 PUFFS BY MOUTH EVERY 6 HOURS AS NEEDED FOR WHEEZE OR SHORTNESS OF BREATH   budesonide -formoterol  (SYMBICORT ) 80-4.5 MCG/ACT inhaler INHALE 2 PUFFS INTO THE LUNGS TWICE A DAY   Cholecalciferol  (VITAMIN D3 PO) Take 1 capsule by mouth at bedtime.   Cyanocobalamin  (VITAMIN B-12 PO) Take 1 tablet by mouth at bedtime.   Deferiprone  (FERRIPROX ) 500 MG TABS Take 500 mg by mouth 2 (two) times daily.   diphenhydrAMINE  (BENADRYL ) 25 MG tablet Take 25 mg by mouth See admin instructions. Take 25 mg by mouth every four to six hours with each dose of Hydromorphone    folic acid  (FOLVITE ) 1 MG tablet Take 1 tablet (1 mg total) by mouth daily.   HYDROmorphone  (DILAUDID ) 4 MG tablet Take 1 tablet (4 mg total) by mouth every 6 (six) hours as needed for severe pain (pain score 7-10).   naloxone  (NARCAN ) nasal spray 4 mg/0.1 mL Place 0.1 sprays (0.4 mg total) into the nose once as needed (opioid overdose).   polyethylene glycol (MIRALAX  / GLYCOLAX ) 17 g packet Take 17 g by mouth daily as needed for mild constipation (MIX AS DIRECTED AND DRINK).   promethazine  (PHENERGAN ) 25 MG tablet Take 0.5-1 tablets (12.5-25 mg total) by mouth every 6 (six) hours as needed for nausea or vomiting.   XARELTO  20 MG TABS tablet Take 1 tablet (  20 mg total) by mouth daily with supper.   fluticasone  (FLONASE ) 50 MCG/ACT nasal spray Place 1 spray into both nostrils daily as needed for allergies or rhinitis. (Patient not taking: Reported on 12/16/2023)   mirtazapine  (REMERON ) 45 MG tablet Take 1 tablet (45 mg total) by mouth at bedtime.   No facility-administered encounter medications on file as of 01/15/2024.    Review of Systems  Review of Systems  Constitutional: Negative.   HENT: Negative.    Cardiovascular: Negative.   Gastrointestinal: Negative.   Allergic/Immunologic: Negative.   Neurological:  Negative.   Psychiatric/Behavioral: Negative.       Objective:   BP 103/88   Pulse (!) 103   Temp 99 F (37.2 C) (Oral)   Wt 117 lb 9.6 oz (53.3 kg)   LMP  (LMP Unknown)   SpO2 100%   BMI 20.83 kg/m   Wt Readings from Last 5 Encounters:  01/15/24 117 lb 9.6 oz (53.3 kg)  12/30/23 123 lb (55.8 kg)  09/27/23 123 lb 7.3 oz (56 kg)  08/31/23 124 lb 6.4 oz (56.4 kg)  08/06/23 120 lb (54.4 kg)     Physical Exam Vitals and nursing note reviewed.  Constitutional:      General: She is not in acute distress.    Appearance: She is well-developed.  Cardiovascular:     Rate and Rhythm: Normal rate and regular rhythm.  Pulmonary:     Effort: Pulmonary effort is normal.     Breath sounds: Normal breath sounds.  Neurological:     Mental Status: She is alert and oriented to person, place, and time.       Assessment & Plan:   Sickle cell pain crisis (HCC) -     ToxAssure Flex 15, Ur  Patient transported to the ED   Return in about 3 months (around 04/16/2024).   Bascom GORMAN Borer, NP 01/15/2024

## 2024-01-15 NOTE — ED Triage Notes (Signed)
 Pt reports with [facility] since yesterday. Pt is having pain in her chest. Last medication taken yesterday Dilaudid  4 mg.

## 2024-01-15 NOTE — ED Provider Notes (Signed)
 [place] EMERGENCY DEPARTMENT AT [facility] Provider Note   CSN: [id number] Arrival date & time: 01/15/24  1202     Patient presents with: Sickle Cell Pain Crisis   Molly Gutierrez  is a 43 y.o. female.  {Add pertinent medical, surgical, social history, OB history to YEP:67052}  Sickle Cell Pain Crisis    Patient has a history of sickle cell anemia, pulmonary embolism.  Patient presents ED with complaints of chest pain.  Patient states she started having symptoms yesterday.  She is having pain in the center of her chest.  She has been feeling feverish.  Patient states she has had trouble with chest syndrome in the past.  She also has had PE.  She has been compliant with her anticoagulation.  Prior to Admission medications   Medication Sig Start Date End Date Taking? Authorizing Provider  acetaminophen  (TYLENOL ) 500 MG tablet Take 500 mg by mouth every 6 (six) hours as needed for mild pain or headache (or cramps).    [provider]  albuterol  (VENTOLIN  HFA) 108 (90 Base) MCG/ACT inhaler TAKE 2 PUFFS BY MOUTH EVERY 6 HOURS AS NEEDED FOR WHEEZE OR SHORTNESS OF BREATH 01/22/23   Oley Bascom RAMAN, NP  budesonide -formoterol  (SYMBICORT ) 80-4.5 MCG/ACT inhaler INHALE 2 PUFFS INTO THE LUNGS TWICE A DAY 01/27/23   Nichols, Tonya S, NP  Cholecalciferol  (VITAMIN D3 PO) Take 1 capsule by mouth at bedtime.    [provider]  Cyanocobalamin  (VITAMIN B-12 PO) Take 1 tablet by mouth at bedtime.    [provider]  Deferiprone  (FERRIPROX ) 500 MG TABS Take 500 mg by mouth 2 (two) times daily. 10/15/23   Oley Bascom RAMAN, NP  diphenhydrAMINE  (BENADRYL ) 25 MG tablet Take 25 mg by mouth See admin instructions. Take 25 mg by mouth every four to six hours with each dose of Hydromorphone     [provider]  fluticasone  (FLONASE ) 50 MCG/ACT nasal spray Place 1 spray into both nostrils daily as needed for allergies or rhinitis. Patient not taking: Reported on  12/16/2023 12/31/21 05/05/23  Oley Bascom RAMAN, NP  folic acid  (FOLVITE ) 1 MG tablet Take 1 tablet (1 mg total) by mouth daily. 12/23/22   Oley Bascom RAMAN, NP  HYDROmorphone  (DILAUDID ) 4 MG tablet Take 1 tablet (4 mg total) by mouth every 6 (six) hours as needed for severe pain (pain score 7-10). 01/04/24   Jegede, Olugbemiga E, MD  mirtazapine  (REMERON ) 45 MG tablet Take 1 tablet (45 mg total) by mouth at bedtime. 12/23/22 12/30/23  Oley Bascom RAMAN, NP  naloxone  (NARCAN ) nasal spray 4 mg/0.1 mL Place 0.1 sprays (0.4 mg total) into the nose once as needed (opioid overdose). 12/23/22   Nichols, Tonya S, NP  polyethylene glycol (MIRALAX  / GLYCOLAX ) 17 g packet Take 17 g by mouth daily as needed for mild constipation (MIX AS DIRECTED AND DRINK). 12/23/22   Oley Bascom RAMAN, NP  promethazine  (PHENERGAN ) 25 MG tablet Take 0.5-1 tablets (12.5-25 mg total) by mouth every 6 (six) hours as needed for nausea or vomiting. 08/06/23 08/05/24  Oley Bascom RAMAN, NP  XARELTO  20 MG TABS tablet Take 1 tablet (20 mg total) by mouth daily with supper. 09/17/23 09/16/24  Oley Bascom RAMAN, NP    Allergies: Cefaclor, Hydroxyurea, Omeprazole , and Ketamine     Review of Systems  Updated Vital Signs BP 123/63 (BP Location: Right Arm)   Pulse 93   Temp 100 F (37.8 C) (Oral)   Resp 15   LMP 12/17/2023 (Approximate)  SpO2 100%   Physical Exam Vitals and nursing note reviewed.  Constitutional:      Appearance: She is well-developed.     Comments: Appears to be in pain  HENT:     Head: Normocephalic and atraumatic.     Right Ear: External ear normal.     Left Ear: External ear normal.  Eyes:     General: No scleral icterus.       Right eye: No discharge.        Left eye: No discharge.     Conjunctiva/sclera: Conjunctivae normal.  Neck:     Trachea: No tracheal deviation.  Cardiovascular:     Rate and Rhythm: Normal rate and regular rhythm.  Pulmonary:     Effort: Pulmonary effort is normal. No respiratory distress.      Breath sounds: Normal breath sounds. No stridor. No wheezing or rales.  Abdominal:     General: Bowel sounds are normal. There is no distension.     Palpations: Abdomen is soft.     Tenderness: There is no abdominal tenderness. There is no guarding or rebound.  Musculoskeletal:        General: No tenderness or deformity.     Cervical back: Neck supple.  Skin:    General: Skin is warm and dry.     Findings: No rash.  Neurological:     General: No focal deficit present.     Mental Status: She is alert.     Cranial Nerves: No cranial nerve deficit, dysarthria or facial asymmetry.     Sensory: No sensory deficit.     Motor: No abnormal muscle tone or seizure activity.     Coordination: Coordination normal.  Psychiatric:        Mood and Affect: Mood normal.     (all labs ordered are listed, but only abnormal results are displayed) Labs Reviewed  CBC WITH DIFFERENTIAL/PLATELET  RETICULOCYTES  COMPREHENSIVE METABOLIC PANEL WITH GFR  TROPONIN I (HIGH SENSITIVITY)    EKG: None  Radiology: DG Chest 2 View Result Date: 01/15/2024 CLINICAL DATA:  Chest pain. EXAM: CHEST - 2 VIEW COMPARISON:  December 30, 2023. FINDINGS: Stable cardiomediastinal silhouette. Bilateral internal jugular catheters are unchanged. Stable right breast calcifications are noted. No acute pulmonary disease is noted. Bony thorax is unremarkable. IMPRESSION: No active cardiopulmonary disease. Electronically Signed   By: Lynwood Landy Raddle M.D.   On: 01/15/2024 12:42    {Document cardiac monitor, telemetry assessment procedure when appropriate:32947} Procedures   Medications Ordered in the ED  ketorolac  (TORADOL ) 15 MG/ML injection 15 mg (has no administration in time range)  diphenhydrAMINE  (BENADRYL ) capsule 25 mg (has no administration in time range)  ondansetron  (ZOFRAN ) injection 4 mg (has no administration in time range)  HYDROmorphone  (DILAUDID ) injection 2 mg (has no administration in time range)  HYDROmorphone   (DILAUDID ) injection 2 mg (has no administration in time range)  HYDROmorphone  (DILAUDID ) injection 2 mg (has no administration in time range)  acetaminophen  (TYLENOL ) tablet 650 mg (650 mg Oral Given 01/15/24 1228)      {Click here for ABCD2, HEART and other calculators REFRESH Note before signing:1}                              Medical Decision Making Amount and/or Complexity of Data Reviewed Labs: ordered. Radiology: ordered.  Risk OTC drugs. Prescription drug management.   ***  {Document critical care time when appropriate  Document review  of labs and clinical decision tools ie CHADS2VASC2, etc  Document your independent review of radiology images and any outside records  Document your discussion with family members, caretakers and with consultants  Document social determinants of health affecting pt's care  Document your decision making why or why not admission, treatments were needed:32947:::1}   Final diagnoses:  None    ED Discharge Orders     None

## 2024-01-15 NOTE — ED Provider Notes (Signed)
  Physical Exam  BP (!) 126/98 (BP Location: Right Arm)   Pulse 90   Temp 98.7 F (37.1 C) (Oral)   Resp 15   LMP 12/17/2023 (Approximate)   SpO2 97%   Physical Exam Vitals and nursing note reviewed.  Constitutional:      General: She is not in acute distress.    Appearance: She is well-developed.  HENT:     Head: Normocephalic and atraumatic.  Eyes:     Conjunctiva/sclera: Conjunctivae normal.  Cardiovascular:     Rate and Rhythm: Normal rate and regular rhythm.     Heart sounds: No murmur heard. Pulmonary:     Effort: Pulmonary effort is normal. No respiratory distress.     Breath sounds: Normal breath sounds.  Abdominal:     Palpations: Abdomen is soft.     Tenderness: There is no abdominal tenderness.  Musculoskeletal:        General: No swelling.     Cervical back: Neck supple.  Skin:    General: Skin is warm and dry.     Capillary Refill: Capillary refill takes less than 2 seconds.  Neurological:     Mental Status: She is alert.  Psychiatric:        Mood and Affect: Mood normal.     Procedures  Procedures  ED Course / MDM   Clinical Course as of 01/15/24 1751  Thu Jan 15, 2024  1531 CXR negative [JK]    Clinical Course User Index [JK] Randol Simmonds, MD   Medical Decision Making Amount and/or Complexity of Data Reviewed Labs: ordered. Radiology: ordered.  Risk OTC drugs. Prescription drug management.   Patient received in handoff.  Sickle cell pain crisis pending reevaluation after pain control.  Patient received multiple doses of Dilaudid , Toradol , Benadryl  and reevaluation she states that her pain has improved and she would like to try getting through this crisis at home.  She states that she is run out of her home oral Dilaudid  and her prescription refills in 5 days.  I will write for a oral Dilaudid  5-day bridge until she can fill her long-term prescription.  At this time she does not meet inpatient criteria for admission will be discharged  outpatient follow-up.  She is not hypoxic and chest pain has resolved and I have lower suspicion for PE or acute chest this time.       Albertina Dixon, MD 01/15/24 [phone]

## 2024-01-19 ENCOUNTER — Other Ambulatory Visit: Payer: Self-pay

## 2024-01-19 DIAGNOSIS — D5700 Hb-SS disease with crisis, unspecified: Secondary | ICD-10-CM

## 2024-01-19 DIAGNOSIS — G894 Chronic pain syndrome: Secondary | ICD-10-CM

## 2024-01-19 LAB — TOXASSURE FLEX 15, UR
6-ACETYLMORPHINE IA: NEGATIVE ng/mL
7-aminoclonazepam: NOT DETECTED ng/mg{creat}
AMPHETAMINES IA: NEGATIVE ng/mL
Alpha-hydroxyalprazolam: NOT DETECTED ng/mg{creat}
Alpha-hydroxymidazolam: NOT DETECTED ng/mg{creat}
Alpha-hydroxytriazolam: NOT DETECTED ng/mg{creat}
Alprazolam: NOT DETECTED ng/mg{creat}
BARBITURATES IA: NEGATIVE ng/mL
Buprenorphine: NOT DETECTED ng/mg{creat}
CANNABINOIDS IA: NEGATIVE ng/mL
COCAINE METABOLITE IA: NEGATIVE ng/mL
Clonazepam: NOT DETECTED ng/mg{creat}
Creatinine: 46 mg/dL (ref >=20)
Desalkylflurazepam: NOT DETECTED ng/mg{creat}
Desmethyldiazepam: NOT DETECTED ng/mg{creat}
Desmethylflunitrazepam: NOT DETECTED ng/mg{creat}
Diazepam: NOT DETECTED ng/mg{creat}
ETHYL ALCOHOL Enzymatic: NEGATIVE g/dL
Fentanyl: NOT DETECTED ng/mg{creat}
Flunitrazepam: NOT DETECTED ng/mg{creat}
Lorazepam: NOT DETECTED ng/mg{creat}
METHADONE IA: NEGATIVE ng/mL
METHADONE MTB IA: NEGATIVE ng/mL
Midazolam: NOT DETECTED ng/mg{creat}
Norbuprenorphine: NOT DETECTED ng/mg{creat}
Norfentanyl: NOT DETECTED ng/mg{creat}
OXYCODONE CLASS IA: NEGATIVE ng/mL
Oxazepam: NOT DETECTED ng/mg{creat}
PHENCYCLIDINE IA: NEGATIVE ng/mL
TAPENTADOL, IA: NEGATIVE ng/mL
TRAMADOL IA: NEGATIVE ng/mL
Temazepam: NOT DETECTED ng/mg{creat}

## 2024-01-19 LAB — OPIATE CLASS, MS, UR RFX
Codeine: NOT DETECTED ng/mg{creat}
Dihydrocodeine: NOT DETECTED ng/mg{creat}
Hydrocodone: NOT DETECTED ng/mg{creat}
Hydromorphone: 1415 ng/mg{creat}
Morphine: NOT DETECTED ng/mg{creat}
Norcodeine: NOT DETECTED ng/mg{creat}
Norhydrocodone: NOT DETECTED ng/mg{creat}
Normorphine: NOT DETECTED ng/mg{creat}
Opiate Class Confirmation: POSITIVE

## 2024-01-19 MED ORDER — HYDROMORPHONE HCL 4 MG PO TABS: 4.0000 mg | ORAL_TABLET | Freq: Four times a day (QID) | ORAL | 0 refills | Status: DC | PRN

## 2024-01-26 ENCOUNTER — Other Ambulatory Visit: Payer: Self-pay

## 2024-01-27 ENCOUNTER — Other Ambulatory Visit: Payer: Self-pay

## 2024-01-27 DIAGNOSIS — J4540 Moderate persistent asthma, uncomplicated: Secondary | ICD-10-CM | POA: Diagnosis not present

## 2024-01-27 DIAGNOSIS — D649 Anemia, unspecified: Secondary | ICD-10-CM | POA: Diagnosis not present

## 2024-01-27 DIAGNOSIS — D72829 Elevated white blood cell count, unspecified: Secondary | ICD-10-CM | POA: Diagnosis not present

## 2024-01-27 DIAGNOSIS — R0789 Other chest pain: Secondary | ICD-10-CM | POA: Diagnosis not present

## 2024-01-27 DIAGNOSIS — D5700 Hb-SS disease with crisis, unspecified: Secondary | ICD-10-CM | POA: Diagnosis not present

## 2024-01-27 DIAGNOSIS — Z7901 Long term (current) use of anticoagulants: Secondary | ICD-10-CM | POA: Diagnosis not present

## 2024-01-27 DIAGNOSIS — E876 Hypokalemia: Secondary | ICD-10-CM | POA: Diagnosis not present

## 2024-01-27 DIAGNOSIS — K219 Gastro-esophageal reflux disease without esophagitis: Secondary | ICD-10-CM | POA: Diagnosis not present

## 2024-01-27 DIAGNOSIS — Z86718 Personal history of other venous thrombosis and embolism: Secondary | ICD-10-CM | POA: Diagnosis not present

## 2024-01-27 DIAGNOSIS — J4520 Mild intermittent asthma, uncomplicated: Secondary | ICD-10-CM | POA: Diagnosis not present

## 2024-01-27 DIAGNOSIS — Z7951 Long term (current) use of inhaled steroids: Secondary | ICD-10-CM | POA: Diagnosis not present

## 2024-01-27 DIAGNOSIS — Z0389 Encounter for observation for other suspected diseases and conditions ruled out: Secondary | ICD-10-CM | POA: Diagnosis not present

## 2024-01-28 ENCOUNTER — Other Ambulatory Visit: Payer: Self-pay

## 2024-01-29 ENCOUNTER — Other Ambulatory Visit: Payer: Self-pay

## 2024-01-30 ENCOUNTER — Other Ambulatory Visit (HOSPITAL_COMMUNITY): Payer: Self-pay

## 2024-02-02 ENCOUNTER — Other Ambulatory Visit: Payer: Self-pay

## 2024-02-02 DIAGNOSIS — G894 Chronic pain syndrome: Secondary | ICD-10-CM

## 2024-02-02 DIAGNOSIS — D5700 Hb-SS disease with crisis, unspecified: Secondary | ICD-10-CM

## 2024-02-04 MED ORDER — HYDROMORPHONE HCL 4 MG PO TABS: 4.0000 mg | ORAL_TABLET | Freq: Four times a day (QID) | ORAL | 0 refills | Status: DC | PRN

## 2024-02-06 ENCOUNTER — Telehealth: Payer: Self-pay

## 2024-02-06 NOTE — Transitions of Care (Post Inpatient/ED Visit) (Signed)
   02/06/2024  Name: Sherlie Crain  MRN: [id number] DOB: 07-07-1980  Today's TOC FU Call Status: Today's TOC FU Call Status:: Unsuccessful Call (1st Attempt) Unsuccessful Call (1st Attempt) Date: 02/06/24  Attempted to reach the patient regarding the most recent Inpatient/ED visit.  Follow Up Plan: Additional outreach attempts will be made to reach the patient to complete the Transitions of Care (Post Inpatient/ED visit) call.   Ladarryl Wrage J. Corrinne Benegas RN, MSN [facility], [facility] RN Care Manager Direct Dial: [phone]  Fax: [phone] Website: [email or web address]

## 2024-02-09 ENCOUNTER — Telehealth: Payer: Self-pay

## 2024-02-09 NOTE — Transitions of Care (Post Inpatient/ED Visit) (Signed)
   02/09/2024  Name: Molly Gutierrez  MRN: [id number] DOB: 11-Jul-1980  Today's TOC FU Call Status: Today's TOC FU Call Status:: Unsuccessful Call (2nd Attempt) Unsuccessful Call (2nd Attempt) Date: 02/09/24  Attempted to reach the patient regarding the most recent Inpatient/ED visit.  Follow Up Plan: Additional outreach attempts will be made to reach the patient to complete the Transitions of Care (Post Inpatient/ED visit) call.   Arvin Seip RN, BSN, CCM [employer], Population Health Case Manager Phone: [phone]

## 2024-02-09 NOTE — Telephone Encounter (Signed)
 This encounter was created in error - please disregard.

## 2024-02-10 ENCOUNTER — Telehealth: Payer: Self-pay

## 2024-02-10 NOTE — Transitions of Care (Post Inpatient/ED Visit) (Signed)
   02/10/2024  Name: Molly Gutierrez  MRN: [id number] DOB: Feb 18, 1981  Today's TOC FU Call Status: Today's TOC FU Call Status:: Unsuccessful Call (3rd Attempt) Unsuccessful Call (3rd Attempt) Date: 02/10/24  Attempted to reach the patient regarding the most recent Inpatient/ED visit.  Follow Up Plan: No further outreach attempts will be made at this time. We have been unable to contact the patient.  Shona Prow RN, CCM [place]  [facility]-Population Health RN Care Manager [phone]

## 2024-02-14 DIAGNOSIS — Z7951 Long term (current) use of inhaled steroids: Secondary | ICD-10-CM | POA: Diagnosis not present

## 2024-02-14 DIAGNOSIS — R079 Chest pain, unspecified: Secondary | ICD-10-CM | POA: Diagnosis not present

## 2024-02-14 DIAGNOSIS — D638 Anemia in other chronic diseases classified elsewhere: Secondary | ICD-10-CM | POA: Diagnosis not present

## 2024-02-14 DIAGNOSIS — D5701 Hb-SS disease with acute chest syndrome: Secondary | ICD-10-CM | POA: Diagnosis not present

## 2024-02-14 DIAGNOSIS — Z7901 Long term (current) use of anticoagulants: Secondary | ICD-10-CM | POA: Diagnosis not present

## 2024-02-14 DIAGNOSIS — E876 Hypokalemia: Secondary | ICD-10-CM | POA: Diagnosis not present

## 2024-02-14 DIAGNOSIS — R0789 Other chest pain: Secondary | ICD-10-CM | POA: Diagnosis not present

## 2024-02-14 DIAGNOSIS — Z86711 Personal history of pulmonary embolism: Secondary | ICD-10-CM | POA: Diagnosis not present

## 2024-02-14 DIAGNOSIS — J4520 Mild intermittent asthma, uncomplicated: Secondary | ICD-10-CM | POA: Diagnosis not present

## 2024-02-14 DIAGNOSIS — D72829 Elevated white blood cell count, unspecified: Secondary | ICD-10-CM | POA: Diagnosis not present

## 2024-02-14 DIAGNOSIS — D5700 Hb-SS disease with crisis, unspecified: Secondary | ICD-10-CM | POA: Diagnosis not present

## 2024-02-15 DIAGNOSIS — D5700 Hb-SS disease with crisis, unspecified: Secondary | ICD-10-CM | POA: Diagnosis not present

## 2024-02-15 DIAGNOSIS — Z86711 Personal history of pulmonary embolism: Secondary | ICD-10-CM | POA: Diagnosis not present

## 2024-02-15 DIAGNOSIS — E876 Hypokalemia: Secondary | ICD-10-CM | POA: Diagnosis not present

## 2024-02-15 DIAGNOSIS — J4520 Mild intermittent asthma, uncomplicated: Secondary | ICD-10-CM | POA: Diagnosis not present

## 2024-02-15 DIAGNOSIS — D638 Anemia in other chronic diseases classified elsewhere: Secondary | ICD-10-CM | POA: Diagnosis not present

## 2024-02-16 DIAGNOSIS — D638 Anemia in other chronic diseases classified elsewhere: Secondary | ICD-10-CM | POA: Diagnosis not present

## 2024-02-16 DIAGNOSIS — Z86711 Personal history of pulmonary embolism: Secondary | ICD-10-CM | POA: Diagnosis not present

## 2024-02-16 DIAGNOSIS — J4520 Mild intermittent asthma, uncomplicated: Secondary | ICD-10-CM | POA: Diagnosis not present

## 2024-02-16 DIAGNOSIS — E876 Hypokalemia: Secondary | ICD-10-CM | POA: Diagnosis not present

## 2024-02-16 DIAGNOSIS — D5700 Hb-SS disease with crisis, unspecified: Secondary | ICD-10-CM | POA: Diagnosis not present

## 2024-02-17 DIAGNOSIS — D5700 Hb-SS disease with crisis, unspecified: Secondary | ICD-10-CM | POA: Diagnosis not present

## 2024-02-18 ENCOUNTER — Other Ambulatory Visit: Payer: Self-pay

## 2024-02-18 DIAGNOSIS — D5700 Hb-SS disease with crisis, unspecified: Secondary | ICD-10-CM

## 2024-02-18 DIAGNOSIS — G894 Chronic pain syndrome: Secondary | ICD-10-CM

## 2024-02-18 MED ORDER — HYDROMORPHONE HCL 4 MG PO TABS: 4.0000 mg | ORAL_TABLET | Freq: Four times a day (QID) | ORAL | 0 refills | Status: DC | PRN

## 2024-02-18 NOTE — Telephone Encounter (Signed)
 Please advise [facility]

## 2024-02-19 DIAGNOSIS — D5700 Hb-SS disease with crisis, unspecified: Secondary | ICD-10-CM | POA: Diagnosis not present

## 2024-02-20 DIAGNOSIS — D5700 Hb-SS disease with crisis, unspecified: Secondary | ICD-10-CM | POA: Diagnosis not present

## 2024-02-21 DIAGNOSIS — D5700 Hb-SS disease with crisis, unspecified: Secondary | ICD-10-CM | POA: Diagnosis not present

## 2024-02-23 ENCOUNTER — Telehealth: Payer: Self-pay

## 2024-02-23 NOTE — Transitions of Care (Post Inpatient/ED Visit) (Signed)
   02/23/2024  Name: Molly Gutierrez  MRN: [id number] DOB: [birth year]  Today's TOC FU Call Status: Today's TOC FU Call Status:: Unsuccessful Call (1st Attempt) Unsuccessful Call (1st Attempt) Date: 02/23/24  Attempted to reach the patient regarding the most recent Inpatient/ED visit.  Follow Up Plan: No further outreach attempts will be made at this time. We have been unable to contact the patient.  Arvin Seip RN, BSN, CCM [employer], Population Health Case Manager Phone: [phone]

## 2024-02-24 ENCOUNTER — Telehealth: Payer: Self-pay

## 2024-02-24 NOTE — Transitions of Care (Post Inpatient/ED Visit) (Signed)
   02/24/2024  Name: Molly Gutierrez  MRN: [id number] DOB: 07-14-1980  Today's TOC FU Call Status: Today's TOC FU Call Status:: Unsuccessful Call (2nd Attempt) Unsuccessful Call (2nd Attempt) Date: 02/24/24  Attempted to reach the patient regarding the most recent Inpatient/ED visit.  Follow Up Plan: Additional outreach attempts will be made to reach the patient to complete the Transitions of Care (Post Inpatient/ED visit) call.   Arvin Seip RN, BSN, CCM [employer], Population Health Case Manager Phone: [phone]

## 2024-02-25 ENCOUNTER — Telehealth: Payer: Self-pay

## 2024-02-25 NOTE — Transitions of Care (Post Inpatient/ED Visit) (Signed)
   02/25/2024  Name: Molly Gutierrez  MRN: [id number] DOB: Oct 16, 1980  Today's TOC FU Call Status: Today's TOC FU Call Status:: Unsuccessful Call (3rd Attempt) Unsuccessful Call (3rd Attempt) Date: 02/25/24  Attempted to reach the patient regarding the most recent Inpatient/ED visit.  Follow Up Plan: No further outreach attempts will be made at this time. We have been unable to contact the patient.  Arvin Seip RN, BSN, CCM [employer], Population Health Case Manager Phone: [phone]

## 2024-03-01 ENCOUNTER — Other Ambulatory Visit (HOSPITAL_COMMUNITY): Payer: Self-pay

## 2024-03-01 ENCOUNTER — Other Ambulatory Visit: Payer: Self-pay

## 2024-03-03 DIAGNOSIS — Z86711 Personal history of pulmonary embolism: Secondary | ICD-10-CM | POA: Diagnosis not present

## 2024-03-03 DIAGNOSIS — R0789 Other chest pain: Secondary | ICD-10-CM | POA: Diagnosis not present

## 2024-03-03 DIAGNOSIS — R079 Chest pain, unspecified: Secondary | ICD-10-CM | POA: Diagnosis not present

## 2024-03-03 DIAGNOSIS — J4520 Mild intermittent asthma, uncomplicated: Secondary | ICD-10-CM | POA: Diagnosis not present

## 2024-03-03 DIAGNOSIS — Z7951 Long term (current) use of inhaled steroids: Secondary | ICD-10-CM | POA: Diagnosis not present

## 2024-03-03 DIAGNOSIS — Z7901 Long term (current) use of anticoagulants: Secondary | ICD-10-CM | POA: Diagnosis not present

## 2024-03-03 DIAGNOSIS — D5700 Hb-SS disease with crisis, unspecified: Secondary | ICD-10-CM | POA: Diagnosis not present

## 2024-03-04 ENCOUNTER — Other Ambulatory Visit: Payer: Self-pay

## 2024-03-05 ENCOUNTER — Other Ambulatory Visit: Payer: Self-pay | Admitting: Nurse Practitioner

## 2024-03-05 DIAGNOSIS — G894 Chronic pain syndrome: Secondary | ICD-10-CM

## 2024-03-05 DIAGNOSIS — D5700 Hb-SS disease with crisis, unspecified: Secondary | ICD-10-CM

## 2024-03-05 MED ORDER — HYDROMORPHONE HCL 4 MG PO TABS: 4.0000 mg | ORAL_TABLET | Freq: Four times a day (QID) | ORAL | 0 refills | Status: DC | PRN

## 2024-03-05 MED ORDER — MIRTAZAPINE 45 MG PO TABS: 45.0000 mg | ORAL_TABLET | Freq: Every day | ORAL | 3 refills | Status: AC

## 2024-03-12 ENCOUNTER — Telehealth: Payer: Self-pay

## 2024-03-12 NOTE — Transitions of Care (Post Inpatient/ED Visit) (Signed)
   03/12/2024  Name: Molly Gutierrez  MRN: [id number] DOB: [birth year]  Today's TOC FU Call Status: Today's TOC FU Call Status:: Unsuccessful Call (1st Attempt) Unsuccessful Call (1st Attempt) Date: 03/12/24  Attempted to reach the patient regarding the most recent Inpatient/ED visit.  Follow Up Plan: Additional outreach attempts will be made to reach the patient to complete the Transitions of Care (Post Inpatient/ED visit) call.   Arvin Seip RN, BSN, CCM [employer], Population Health Case Manager Phone: [phone]

## 2024-03-15 ENCOUNTER — Telehealth: Payer: Self-pay

## 2024-03-15 NOTE — Transitions of Care (Post Inpatient/ED Visit) (Signed)
   03/15/2024  Name: Molly Gutierrez  MRN: [id number] DOB: [birth year]  Today's TOC FU Call Status: Today's TOC FU Call Status:: Unsuccessful Call (2nd Attempt) Unsuccessful Call (2nd Attempt) Date: 03/15/24  Attempted to reach the patient regarding the most recent Inpatient/ED visit.  Follow Up Plan: Additional outreach attempts will be made to reach the patient to complete the Transitions of Care (Post Inpatient/ED visit) call.   Arvin Seip RN, BSN, CCM [employer], Population Health Case Manager Phone: [phone]

## 2024-03-16 ENCOUNTER — Telehealth: Payer: Self-pay

## 2024-03-16 NOTE — Transitions of Care (Post Inpatient/ED Visit) (Signed)
   03/16/2024  Name: Molly Gutierrez  MRN: [id number] DOB: 1980/09/26  Today's TOC FU Call Status: Unsuccessful Call (3rd Attempt) Date: 03/16/24  Attempted to reach the patient regarding the most recent Inpatient/ED visit.  Follow Up Plan: No further outreach attempts will be made at this time. We have been unable to contact the patient.  Alan Ee, RN, BSN, CEN [employer]- Transition of Care Team.  [facility] [phone]

## 2024-03-19 ENCOUNTER — Telehealth: Payer: Self-pay | Admitting: Nurse Practitioner

## 2024-03-19 ENCOUNTER — Other Ambulatory Visit: Payer: Self-pay | Admitting: Nurse Practitioner

## 2024-03-19 NOTE — Telephone Encounter (Signed)
 Caller & Relationship to patient:    MRN #  [id number]   Call Back Number:   Date of Last Office Visit: 02/18/2024     Date of Next Office Visit: 03/24/2024    Medication(s) to be Refilled: Hydromorphone  4mg   Preferred Pharmacy:   ** Please notify patient to allow 48-72 hours to process** **Let patient know to contact pharmacy at the end of the day to make sure medication is ready. ** **If patient has not been seen in a year or longer, book an appointment **Advise to use MyChart for refill requests OR to contact their pharmacy

## 2024-03-22 ENCOUNTER — Other Ambulatory Visit: Payer: Self-pay

## 2024-03-22 DIAGNOSIS — G894 Chronic pain syndrome: Secondary | ICD-10-CM

## 2024-03-22 DIAGNOSIS — D5700 Hb-SS disease with crisis, unspecified: Secondary | ICD-10-CM

## 2024-03-22 MED ORDER — HYDROMORPHONE HCL 4 MG PO TABS: 4.0000 mg | ORAL_TABLET | Freq: Four times a day (QID) | ORAL | 0 refills | Status: DC | PRN

## 2024-03-22 NOTE — Telephone Encounter (Signed)
 Please advise [facility]

## 2024-03-24 ENCOUNTER — Ambulatory Visit: Payer: Self-pay | Admitting: Nurse Practitioner

## 2024-03-29 ENCOUNTER — Telehealth: Payer: Self-pay

## 2024-03-29 NOTE — Transitions of Care (Post Inpatient/ED Visit) (Signed)
   03/29/2024  Name: Molly Gutierrez  MRN: [id number] DOB: [birth year]  Today's TOC FU Call Status: Today's TOC FU Call Status:: Unsuccessful Call (1st Attempt) Unsuccessful Call (1st Attempt) Date: 03/29/24  Attempted to reach the patient regarding the most recent Inpatient/ED visit.  Follow Up Plan: Additional outreach attempts will be made to reach the patient to complete the Transitions of Care (Post Inpatient/ED visit) call.   Arvin Seip RN, BSN, CCM [employer], Population Health Case Manager Phone: [phone]

## 2024-03-30 ENCOUNTER — Telehealth: Payer: Self-pay

## 2024-03-30 NOTE — Transitions of Care (Post Inpatient/ED Visit) (Signed)
   03/30/2024  Name: Molly Gutierrez  MRN: [id number] DOB: 11/06/1980  Today's TOC FU Call Status: Today's TOC FU Call Status:: Unsuccessful Call (2nd Attempt) Unsuccessful Call (2nd Attempt) Date: 03/30/24  Attempted to reach the patient regarding the most recent Inpatient/ED visit. Patient request call back tomorrow due to not feeling well today.   Follow Up Plan: Additional outreach attempts will be made to reach the patient to complete the Transitions of Care (Post Inpatient/ED visit) call.   Arvin Seip RN, BSN, CCM [employer], Population Health Case Manager Phone: [phone]

## 2024-03-31 ENCOUNTER — Telehealth: Payer: Self-pay

## 2024-03-31 NOTE — Transitions of Care (Post Inpatient/ED Visit) (Signed)
   03/31/2024  Name: Molly Gutierrez  MRN: [id number] DOB: 11-17-1980  Today's TOC FU Call Status: Today's TOC FU Call Status:: Unsuccessful Call (3rd Attempt) Unsuccessful Call (3rd Attempt) Date: 03/31/24  Attempted to reach the patient regarding the most recent Inpatient/ED visit.  Follow Up Plan: No further outreach attempts will be made at this time. We have been unable to contact the patient.  Arvin Seip RN, BSN, CCM [employer], Population Health Case Manager Phone: [phone]

## 2024-04-08 ENCOUNTER — Other Ambulatory Visit: Payer: Self-pay

## 2024-04-08 DIAGNOSIS — G894 Chronic pain syndrome: Secondary | ICD-10-CM

## 2024-04-08 DIAGNOSIS — D5700 Hb-SS disease with crisis, unspecified: Secondary | ICD-10-CM

## 2024-04-08 NOTE — Telephone Encounter (Signed)
 Please advise [facility]

## 2024-04-09 MED ORDER — HYDROMORPHONE HCL 4 MG PO TABS: 4.0000 mg | ORAL_TABLET | Freq: Four times a day (QID) | ORAL | 0 refills | Status: DC | PRN

## 2024-04-15 ENCOUNTER — Telehealth: Payer: Self-pay

## 2024-04-15 ENCOUNTER — Other Ambulatory Visit: Payer: Self-pay

## 2024-04-15 NOTE — Telephone Encounter (Signed)
 Pharmacy Patient Advocate Encounter   Received notification from CoverMyMeds that prior authorization for HYDROMORPHONE  is required/requested.   Insurance verification completed.   The patient is insured through [facility] MEDICAID.   Per test claim: PA required; PA submitted to above mentioned insurance via [facility] Tracks Key/confirmation #/EOC [id number] W Status is pending

## 2024-04-16 ENCOUNTER — Telehealth: Payer: Self-pay

## 2024-04-16 NOTE — Transitions of Care (Post Inpatient/ED Visit) (Signed)
   04/16/2024  Name: Molly Gutierrez  MRN: [id number] DOB: 05-Jun-1981  Today's TOC FU Call Status: Today's TOC FU Call Status:: Unsuccessful Call (1st Attempt) Unsuccessful Call (1st Attempt) Date: 04/16/24  Attempted to reach the patient regarding the most recent Inpatient/ED visit.  Follow Up Plan: Additional outreach attempts will be made to reach the patient to complete the Transitions of Care (Post Inpatient/ED visit) call.   Arvin Seip RN, BSN, CCM [employer], Population Health Case Manager Phone: [phone]

## 2024-04-19 ENCOUNTER — Telehealth: Payer: Self-pay

## 2024-04-19 NOTE — Transitions of Care (Post Inpatient/ED Visit) (Signed)
   04/19/2024  Name: Molly Gutierrez  MRN: [id number] DOB: 02/09/1981  Today's TOC FU Call Status: Today's TOC FU Call Status:: Unsuccessful Call (3rd Attempt) Unsuccessful Call (3rd Attempt) Date: 04/19/24  Attempted to reach the patient regarding the most recent Inpatient/ED visit.  Follow Up Plan: No further outreach attempts will be made at this time. We have been unable to contact the patient.  Arvin Seip RN, BSN, CCM [employer], Population Health Case Manager Phone: [phone]

## 2024-04-21 ENCOUNTER — Other Ambulatory Visit: Payer: Self-pay

## 2024-04-21 DIAGNOSIS — G894 Chronic pain syndrome: Secondary | ICD-10-CM

## 2024-04-21 DIAGNOSIS — D5700 Hb-SS disease with crisis, unspecified: Secondary | ICD-10-CM

## 2024-04-21 MED ORDER — HYDROMORPHONE HCL 4 MG PO TABS: 4.0000 mg | ORAL_TABLET | Freq: Four times a day (QID) | ORAL | 0 refills | Status: DC | PRN

## 2024-04-21 NOTE — Telephone Encounter (Signed)
 Please advise [facility]

## 2024-04-27 ENCOUNTER — Other Ambulatory Visit: Payer: Self-pay

## 2024-04-27 NOTE — Discharge Summary (Signed)
 Discharge Summary   Admission Date:   04/23/2024  2:02 PM                       Discharge Date:   04/27/2024                  DISCHARGE DIAGNOSIS: Pain crisis Acute on chronic anemia requiring blood transfusion Mild intermittent asthma without complication History of PE on Xarelto   Hospital Course:  43 year old female with history of asthma, sickle cell disease and pulmonary embolism currently on Xarelto .  Presented to the ER with another episode of sickle cell pain crisis.  She was admitted, treated with routine pain regiment, IV push Dilaudid  and IV fluids.  Also found to have evidence of acute on chronic anemia and was given a blood transfusion, hemoglobin responded appropriately and overnight on discharge.  By the day of discharge patient is requesting discharge, she has been transition to oral Dilaudid  which is her home medication.  She specifically states she does not require any refills on discharge of any of her medications and that she has them.  Routine PCP follow-up ordered, patient advised to maintain hematology follow-ups as well.  Patient clinically well-appearing and medically stable to transition outpatient management at this point.    Physical Exam: Temp:  [98.2 F (36.8 C)-98.8 F (37.1 C)] 98.6 F (37 C) Heart Rate:  [72-93] 81 Resp:  [14-18] 18 BP: (99-108)/(74-84) 100/75  Physical Exam  Discharge Medications:   Discharge Medications     Modified Medications      Sig Disp Refill Start End  folic acid  1 mg tablet Commonly known as: FOLVITE  What changed: when to take this  Take 1 tablet (1 mg total) by mouth daily.  90 tablet  0         Medications To Continue      Sig Disp Refill Start End  budesonide -formoteroL  80-4.5 mcg/actuation inhaler Commonly known as: SYMBICORT ;BREYNA   Inhale 2 puffs 2 (two) times a day.  1 each  1     cholecalciferol  5,000 unit (125 mcg) Tab tablet Commonly known as: VITAMIN D3  Take 5,000 Units by mouth every  evening.   0     cyanocobalamin  1,000 mcg tablet Commonly known as: VITAMIN B12  Take 1,000 mcg by mouth every evening.   0     deferiprone  500 mg Tab  Take 1,000 mg by mouth every evening.   0     diphenhydrAMINE  25 mg tablet Commonly known as: BENADRYL   Take 25 mg by mouth every 4 (four) hours as needed for itching.   0     HYDROmorphone  4 mg tablet Commonly known as: DILAUDID   Take 4 mg by mouth every 6 (six) hours as needed for severe pain (7-10).   0     mirtazapine  45 mg tablet Commonly known as: REMERON   Take 45 mg by mouth at bedtime.   0     Xarelto  20 mg tablet Generic drug: rivaroxaban   Take 20 mg by mouth daily with dinner.   0           Follow Up Appointments: No future appointments.  Laboratory Data: Recent Results (from the past 48 hours)  Hemoglobin and Hematocrit   Collection Time: 04/27/24  5:32 AM  Result Value Ref Range   Hemoglobin 9.2 (L) 10.8 - 15.5 g/dL   Hematocrit 25 (L) 34 - 46 %    Imaging: XR Chest 1 View Narrative: DATE OF SERVICE:  04/23/2024 2:23 pm  EXAM: CHEST ONE VIEW  CLINICAL HISTORY: 44 y/o  F is evaluated for  cp.  COMPARISON: XR CHEST 2 VIEWS, ACC: [id number], dated 2024-04-22 11:51:40; XR CHEST 2 VIEWS, ACC: [id number], dated 2024-04-08 11:18:09  FINDINGS: A RIGHT internal jugular tunneled medication port is noted, with the tip projects at the superior cavoatrial junction. A LEFT internal jugular tunneled medication port is noted, with the tip projects at the RIGHT atrium. Lungs: Negative for focal airspace opacities. Irregular calcification is noted at the lower chest, similar to prior exams. Pleura: Negative for pleural effusion. Negative for pneumothorax. Cardiomediastinal silhouette: Borderline cardiomegaly is noted. Mild central pulmonary vascular congestion. Osseous structures: Mild diffuse osseous demineralization is noted.  No apparent acute osseous abnormality. Other findings: Negative for  unexpected radiopaque foreign body. Impression: Borderline cardiomegaly and mild central pulmonary vascular congestion, without overt pulmonary edema or pleural effusion.  THIS IS AN ELECTRONICALLY VERIFIED FINAL REPORT 04/23/2024 2:38 PM - Electronically signed by Cloyd Carl  Workstation: [id number]      Discharge Time: >30 minutes

## 2024-04-28 ENCOUNTER — Telehealth: Payer: Self-pay

## 2024-04-28 NOTE — Transitions of Care (Post Inpatient/ED Visit) (Signed)
   04/28/2024  Name: [name]  MRN: [id number] DOB: [birth year]  Today's TOC FU Call Status: Today's TOC FU Call Status:: Unsuccessful Call (1st Attempt) Unsuccessful Call (1st Attempt) Date: 04/28/24  Attempted to reach the patient regarding the most recent Inpatient/ED visit.  Follow Up Plan: Additional outreach attempts will be made to reach the patient to complete the Transitions of Care (Post Inpatient/ED visit) call.   Arvin Seip RN, BSN, CCM [employer], Population Health Case Manager Phone: [phone]

## 2024-04-29 ENCOUNTER — Telehealth: Payer: Self-pay

## 2024-04-29 NOTE — Transitions of Care (Post Inpatient/ED Visit) (Signed)
   04/29/2024  Name: Molly Gutierrez  MRN: [id number] DOB: [birth year]  Today's TOC FU Call Status: Today's TOC FU Call Status:: Unsuccessful Call (2nd Attempt) Unsuccessful Call (2nd Attempt) Date: 04/29/24  Attempted to reach the patient regarding the most recent Inpatient/ED visit.  Follow Up Plan: Additional outreach attempts will be made to reach the patient to complete the Transitions of Care (Post Inpatient/ED visit) call.   Arvin Seip RN, BSN, CCM [employer], Population Health Case Manager Phone: [phone]

## 2024-04-30 ENCOUNTER — Telehealth: Payer: Self-pay

## 2024-04-30 NOTE — Transitions of Care (Post Inpatient/ED Visit) (Signed)
   04/30/2024  Name: Molly Gutierrez  MRN: [id number] DOB: 1980/10/03  Today's TOC FU Call Status: Today's TOC FU Call Status:: Unsuccessful Call (3rd Attempt) Unsuccessful Call (3rd Attempt) Date: 04/30/24  Attempted to reach the patient regarding the most recent Inpatient/ED visit.  Follow Up Plan: No further outreach attempts will be made at this time. We have been unable to contact the patient.  Arvin Seip RN, BSN, CCM [employer], Population Health Case Manager Phone: [phone]

## 2024-05-06 ENCOUNTER — Other Ambulatory Visit: Payer: Self-pay

## 2024-05-06 DIAGNOSIS — D5700 Hb-SS disease with crisis, unspecified: Secondary | ICD-10-CM

## 2024-05-06 DIAGNOSIS — G894 Chronic pain syndrome: Secondary | ICD-10-CM

## 2024-05-06 MED ORDER — HYDROMORPHONE HCL 4 MG PO TABS: 4.0000 mg | ORAL_TABLET | Freq: Four times a day (QID) | ORAL | 0 refills | Status: DC | PRN

## 2024-05-06 NOTE — Telephone Encounter (Signed)
 Please advise [facility]

## 2024-05-11 ENCOUNTER — Other Ambulatory Visit: Payer: Self-pay

## 2024-05-12 ENCOUNTER — Other Ambulatory Visit (HOSPITAL_COMMUNITY): Payer: Self-pay

## 2024-05-12 ENCOUNTER — Other Ambulatory Visit: Payer: Self-pay

## 2024-05-12 ENCOUNTER — Other Ambulatory Visit: Payer: Self-pay | Admitting: Pharmacy Technician

## 2024-05-12 ENCOUNTER — Encounter: Payer: Self-pay | Admitting: Pharmacist

## 2024-05-15 ENCOUNTER — Other Ambulatory Visit (HOSPITAL_COMMUNITY): Payer: Self-pay

## 2024-05-17 ENCOUNTER — Other Ambulatory Visit: Payer: Self-pay

## 2024-05-21 ENCOUNTER — Other Ambulatory Visit: Payer: Self-pay

## 2024-05-21 DIAGNOSIS — D5700 Hb-SS disease with crisis, unspecified: Secondary | ICD-10-CM

## 2024-05-21 DIAGNOSIS — G894 Chronic pain syndrome: Secondary | ICD-10-CM

## 2024-05-21 MED ORDER — HYDROMORPHONE HCL 4 MG PO TABS: 4.0000 mg | ORAL_TABLET | Freq: Four times a day (QID) | ORAL | 0 refills | Status: AC | PRN

## 2024-05-21 NOTE — Progress Notes (Signed)
 Sent pt a mychart message asking her to call pharmacy with updated coverage.

## 2024-05-21 NOTE — Telephone Encounter (Signed)
 Please advise [facility]

## 2024-05-26 ENCOUNTER — Other Ambulatory Visit (HOSPITAL_COMMUNITY): Payer: Self-pay

## 2024-05-27 ENCOUNTER — Telehealth: Payer: Self-pay

## 2024-05-27 ENCOUNTER — Other Ambulatory Visit: Payer: Self-pay

## 2024-05-27 NOTE — Transitions of Care (Post Inpatient/ED Visit) (Signed)
° °  05/27/2024  Name: Molly Gutierrez  MRN: [id number] DOB: 04/07/1981  Today's TOC FU Call Status: Today's TOC FU Call Status:: Unsuccessful Call (1st Attempt) Unsuccessful Call (1st Attempt) Date: 05/27/24  Attempted to reach the patient regarding the most recent Inpatient/ED visit.  Follow Up Plan: Additional outreach attempts will be made to reach the patient to complete the Transitions of Care (Post Inpatient/ED visit) call.   Arvin Seip RN, BSN, CCM [employer], Population Health Case Manager Phone: [phone]

## 2024-05-28 ENCOUNTER — Telehealth: Payer: Self-pay

## 2024-05-28 NOTE — Transitions of Care (Post Inpatient/ED Visit) (Signed)
° °  05/28/2024  Name: Molly Gutierrez  MRN: [id number] DOB: December 31, 1980  Today's TOC FU Call Status: Today's TOC FU Call Status:: Unsuccessful Call (2nd Attempt) Unsuccessful Call (2nd Attempt) Date: 05/28/24  Attempted to reach the patient regarding the most recent Inpatient/ED visit.  Follow Up Plan: Additional outreach attempts will be made to reach the patient to complete the Transitions of Care (Post Inpatient/ED visit) call.   Arvin Seip RN, BSN, CCM [employer], Population Health Case Manager Phone: [phone]

## 2024-05-28 NOTE — Telephone Encounter (Signed)
 LVM to schedule HFU [facility] Follow-Up)

## 2024-06-01 ENCOUNTER — Other Ambulatory Visit: Payer: Self-pay

## 2024-06-01 ENCOUNTER — Telehealth: Payer: Self-pay

## 2024-06-01 NOTE — Transitions of Care (Post Inpatient/ED Visit) (Signed)
° °  06/01/2024  Name: [place] Lasseter  MRN: [id number] DOB: 1981/02/25  Today's TOC FU Call Status: Today's TOC FU Call Status:: Unsuccessful Call (3rd Attempt) Unsuccessful Call (3rd Attempt) Date: 06/01/24  Attempted to reach the patient regarding the most recent Inpatient/ED visit.  Follow Up Plan: No further outreach attempts will be made at this time. We have been unable to contact the patient.  Shirlee Whitmire J. Averly Ericson RN, MSN [facility], [facility] RN Care Manager Direct Dial: [phone]  Fax: [phone] Website: [email or web address]

## 2024-06-03 ENCOUNTER — Other Ambulatory Visit: Payer: Self-pay

## 2024-06-03 DIAGNOSIS — D5700 Hb-SS disease with crisis, unspecified: Secondary | ICD-10-CM

## 2024-06-03 DIAGNOSIS — G894 Chronic pain syndrome: Secondary | ICD-10-CM

## 2024-06-03 NOTE — Telephone Encounter (Signed)
 Please advise [facility]

## 2024-06-04 MED ORDER — HYDROMORPHONE HCL 4 MG PO TABS: 4.0000 mg | ORAL_TABLET | Freq: Four times a day (QID) | ORAL | 0 refills | Status: DC | PRN

## 2024-06-08 ENCOUNTER — Other Ambulatory Visit: Payer: Self-pay

## 2024-06-21 ENCOUNTER — Other Ambulatory Visit: Payer: Self-pay

## 2024-06-21 DIAGNOSIS — D5700 Hb-SS disease with crisis, unspecified: Secondary | ICD-10-CM

## 2024-06-21 DIAGNOSIS — G894 Chronic pain syndrome: Secondary | ICD-10-CM

## 2024-06-21 MED ORDER — HYDROMORPHONE HCL 4 MG PO TABS: 4.0000 mg | ORAL_TABLET | Freq: Four times a day (QID) | ORAL | 0 refills | Status: DC | PRN

## 2024-06-21 NOTE — Progress Notes (Addendum)
 Nutrition Note   PATIENT NAME: Molly Gutierrez  DATE: 06/21/2024  TIME: 11:48 AM  Note Type: Follow-up Referral Reason: LOS  Assessment  44 year old female with history of sickle cell disease, asthma and PE who is meant on Xarelto  admitted to the hospital for uncomplicated sickle cell pain crisis  Nutrition History: No recent history of malnutrition.  Remains on regular diet with PO trending 50-75% of recent meals.  Receiving Ensure Plus HP BID to provide additional calories/protein.  Pt has multiple ensures at bedside, reports she likes them but also okay with them being d/c'd since she hasn't been consuming all of them.   Last BM Date: 06/18/24.  Receiving NS @ 71ml/hr.  K 3.4, recommend replacement. Wt today likely inaccurate-bed wt not zero'd per notes.  PO Intake from 06/14/24 1143 to 06/21/24 1143    Date/Time Percent Meals Eaten (%) Diet Supplements Supplement Intake (mL) Percent Supplement Consumed (%) Who  06/19/24 1933 0 -- -- -- TDL  06/19/24 1324 50 -- -- -- JSB  06/18/24 1200 75 -- -- -- AS  06/18/24 0739 66 -- -- -- AS  06/14/24 1230 75 -- -- -- SF          Interventions  Nutrition Intervention: General healthful diet Continue with Regular diet and encourage adequate consumption, >/= 50% at each meal. D/c Ensures given pt has excess, not drinking 2 per day.      Nutrition Diagnosis                        Nutrition Diagnosis: Predicted inadequate energy intake Problem Related To: Acute illness Problem as Evidenced By: 0-50% based on minimal documented meals since admission, suspect decreased appetite due to pain.       Nutrition Focused Physical Findings        Pertinent Information  Anthropometrics Weight: 85.2 kg (187 lb 14 oz) Height: 160 cm (5' 3) BMI (Calculated): 33.3  Wt Readings from Last 10 Encounters:  06/21/24 85.2 kg (187 lb 14 oz)  05/21/24 61.7 kg (136 lb 0.4 oz)  04/24/24 61.2 kg (134 lb 14.7 oz)  04/22/24 54.1 kg (119  lb 4.3 oz)  04/14/24 48.6 kg (107 lb 2.3 oz)  04/04/24 55.8 kg (123 lb)  03/23/24 55.3 kg (121 lb 14.6 oz)  03/10/24 54.8 kg (120 lb 13 oz)  02/14/24 54 kg (119 lb 0.8 oz)  02/03/24 54 kg (119 lb 0.8 oz)     Weight History/Comments: Pt with weight gain noted x 6 months. Weight Classification: Normal BMI: 20.62 Skin Integrity: Skin intact Nutrition Related Medications: D3, Remeron , NS @ 75 ml/hr Labs: K 3.4   Last BM Date: 06/18/24      Weights (last 7 days)     Date/Time Weight   06/21/24 0600 85.2 kg (187 lb 14 oz)   06/20/24 0552 85.4 kg (188 lb 4.4 oz)   06/14/24 0137 -- *   Weight: bed zero required. pt declined bed exit for weight assessment. requesting at later time at 06/14/24 0137         Current Diet and Nutrition Support Details  Diet Regular Ensure Plus HP BID (350 kcal, 20 g protein)   Estimated Needs  Nutrition Needs Based On: Actual weight   Calories (kcal/day): 1560 kcal (30 kcal/kg)  Calories based on:    Protein (g/day): 52-62 g  Protein based on: 1-1.2g/kg  Fluid (mL/day):   1-1.1 mL/kcal   Monitoring & Evaluation  Nutrition Goals: Adequate PO  Nutrition Goal Status: Progressing, continue   Follow-up Information  Follow-Up Date: 06/28/24-06/29/24 Follow-Up Reason: Reassessment     Damien JINNY Lack, RD 06/21/2024 11:48 AM  *Some images could not be shown.

## 2024-06-21 NOTE — Telephone Encounter (Signed)
 Please advise [facility]

## 2024-06-22 ENCOUNTER — Telehealth: Payer: Self-pay

## 2024-06-22 NOTE — Transitions of Care (Post Inpatient/ED Visit) (Signed)
" ° °  06/22/2024  Name: Molly Gutierrez  MRN: [id number] DOB: 1980/08/21  Today's TOC FU Call Status: Today's TOC FU Call Status:: Unsuccessful Call (1st Attempt) Unsuccessful Call (1st Attempt) Date: 06/22/24  Attempted to reach the patient regarding the most recent Inpatient/ED visit.  Follow Up Plan: Additional outreach attempts will be made to reach the patient to complete the Transitions of Care (Post Inpatient/ED visit) call.   Arvin Seip RN, BSN, CCM [employer], Population Health Case Manager Phone: [phone]  "

## 2024-06-23 ENCOUNTER — Telehealth: Payer: Self-pay

## 2024-06-23 NOTE — Transitions of Care (Post Inpatient/ED Visit) (Signed)
" ° °  06/23/2024  Name: Molly Gutierrez  MRN: [id number] DOB: 1980/11/10  Today's TOC FU Call Status: Today's TOC FU Call Status:: Unsuccessful Call (2nd Attempt) Unsuccessful Call (2nd Attempt) Date: 06/23/24  Attempted to reach the patient regarding the most recent Inpatient/ED visit.  Follow Up Plan: Additional outreach attempts will be made to reach the patient to complete the Transitions of Care (Post Inpatient/ED visit) call.   Robb Sibal J. Jemar Paulsen RN, MSN [facility], [facility] RN Care Manager Direct Dial: [phone]  Fax: [phone] Website: [email or web address]   "

## 2024-06-24 ENCOUNTER — Other Ambulatory Visit: Payer: Self-pay

## 2024-06-24 ENCOUNTER — Telehealth: Payer: Self-pay

## 2024-06-24 NOTE — Transitions of Care (Post Inpatient/ED Visit) (Signed)
" ° °  06/24/2024  Name: Molly Gutierrez  MRN: [id number] DOB: May 17, 1981  Today's TOC FU Call Status: Today's TOC FU Call Status:: Unsuccessful Call (3rd Attempt) Unsuccessful Call (3rd Attempt) Date: 06/24/24  Attempted to reach the patient regarding the most recent Inpatient/ED visit.  Follow Up Plan: No further outreach attempts will be made at this time. We have been unable to contact the patient.  Arvin Seip RN, BSN, CCM [employer], Population Health Case Manager Phone: [phone]  "

## 2024-07-02 ENCOUNTER — Other Ambulatory Visit: Payer: Self-pay

## 2024-07-02 DIAGNOSIS — D5700 Hb-SS disease with crisis, unspecified: Secondary | ICD-10-CM

## 2024-07-02 DIAGNOSIS — G894 Chronic pain syndrome: Secondary | ICD-10-CM

## 2024-07-02 NOTE — Telephone Encounter (Signed)
 Please Advise.  CB.

## 2024-07-05 MED ORDER — HYDROMORPHONE HCL 4 MG PO TABS: 4.0000 mg | ORAL_TABLET | Freq: Four times a day (QID) | ORAL | 0 refills | Status: DC | PRN

## 2024-07-06 NOTE — Discharge Summary (Signed)
 "  Hospital Medicine Discharge Summary   Demographics: Molly Gutierrez   44 y.o. 04/28/1981 MRN: [id number]    Extended Emergency Contact Information Primary Emergency Contact: Demario ,[place] Phone: [phone] Relation: Father  Full Code  Admit Date: 06/30/2024                            Attending Physician: Tosin Ogunsiakan, MD Discharge Date: 07/06/2024  Primary Care Provider: Bascom Claudene Borer, FNP   [phone]  Consults during this admission: Consult Orders     None      Active & Resolved Diagnosis: Principal Problem (Resolved):   Sickle cell pain crisis    (CMD) Active Problems:   Anemia   Anticoagulated  Disposition: Patient discharged to Home in stable condition.  Discharge follow-up recommendations : See Hospital Course   Hospital Course: 44 year old female with sickle cell disease, history of pulmonary embolism on Xarelto , and chronic anemia presented with chest pain and was admitted for a sickle cell pain crisis. She was recently hospitalized from 12/21 to 1/5 for a similar crisis requiring Dilaudid  PCA and transfusion of 1 unit PRBCs.  Reported recurrence of acute on chronic pain on 1/14, denied other symptoms. Noted with mild leukocytosis, stable anemia and thrombocytosis.  Chest imaging and EKG unremarkable.  She was treated with IV fluids, IV Dilaudid  including via PCA with continuation of Xarelto  for anticoagulation.  Patient gradually improved and opioids were weaned.  She was discharged home in stable condition         Patient Lines/Drains/Airways Status     Active Peripherally inserted central catheter / Central venous catheter / Urethral Catheter     Name Placement date Placement time Site Days   Implantable Port 05/15/24 Right Subclavian 05/15/24  --  Subclavian  52           Wound / Incision Assessment: Refer to Chart Review and Media Tab for images if available.     Vital Sign Range:  Temp:  [98.4 F (36.9  C)-99.1 F (37.3 C)] 98.8 F (37.1 C) Heart Rate:  [90-103] 103 Resp:  [16-18] 16 BP: (100-115)/(75-79) 109/75  Anticoagulant Medications     Direct Factor Xa Inhibitors Start End   * Xarelto  20 mg tablet 09/17/2023 09/16/2024   Class: Historical Med         Discharge Medications     Modified Medications      Sig Disp Refill Start End  folic acid  1 mg tablet Commonly known as: FOLVITE  What changed: when to take this  Take 1 tablet (1 mg total) by mouth daily.  90 tablet  0         Medications To Continue      Sig Disp Refill Start End  budesonide -formoteroL  80-4.5 mcg/actuation inhaler Commonly known as: SYMBICORT ;BREYNA   Inhale 2 puffs 2 (two) times a day.  1 each  1     cholecalciferol  5,000 unit (125 mcg) Tab tablet Commonly known as: VITAMIN D3  Take 5,000 Units by mouth every evening.   0     cyanocobalamin  1,000 mcg tablet Commonly known as: VITAMIN B12  Take 1,000 mcg by mouth every evening.   0     deferiprone  500 mg Tab  Take 1,000 mg by mouth every evening.   0     diphenhydrAMINE  25 mg tablet Commonly known as: BENADRYL   Take 25 mg by mouth every 4 (four) hours as needed for itching.   0  HYDROmorphone  4 mg tablet Commonly known as: DILAUDID   Take 4 mg by mouth every 6 (six) hours as needed for severe pain (7-10).   0     mirtazapine  45 mg tablet Commonly known as: REMERON   Take 45 mg by mouth at bedtime.   0     Xarelto  20 mg tablet Generic drug: rivaroxaban   Take 20 mg by mouth daily with dinner.   0         Discharge Orders     Lifting Limits:     Details:    Lifting Limits: No lifting limits   Return to previous diet     Details:    Diet type: Return to previous diet         Lab Results  Component Value Date/Time   HGB 8.1 (L) 07/06/2024 02:16 AM   HCT 23 (L) 07/06/2024 02:16 AM   WBC 16.31 (H) 07/06/2024 02:16 AM   PLT 471 (H) 07/06/2024 02:16 AM   Lab Results  Component Value Date/Time   NA 135  07/06/2024 02:16 AM   K 3.7 07/06/2024 02:16 AM   CREATININE 0.55 07/06/2024 02:16 AM   BUN 12 07/06/2024 02:16 AM   GLUCOSE 143 (H) 07/06/2024 02:16 AM    Pertinent Imaging: XR Chest 2 Views  Final Result by Clotilda Ronal Dy, DO (01/14 1749)  DATE OF SERVICE:  06/30/2024 5:40 pm    EXAM:  CHEST TWO VIEWS    CLINICAL HISTORY:  RETI RETI    COMPARISON:  XR CHEST 2 VIEWS, ACC: [id number], dated 2024-06-06 21:20:20    FINDINGS:  The lungs are clear. The cardiomediastinal silhouette is within normal   limits. There is no evidence of a pleural effusion or pneumothorax. The   osseous and soft tissue structures are unremarkable. Stable bilateral   porta catheters.    IMPRESSION:  No acute cardiopulmonary process.    THIS IS AN ELECTRONICALLY VERIFIED FINAL REPORT  06/30/2024 5:49 PM - Electronically signed by Clotilda Dy   Workstation: [id number]  [facility]      Predictive Model Details        53% (High)  Factor Value   Calculated 07/06/2024 08:05 41% Number of hospitalizations in last year 16   Readmission Risk Score v2 Model 20% Number of ED visits in last 90 days 6    5% Diagnosis of fluid or electrolyte disorder present    5% Latest RDW in last 72 hrs 21.1 %    3% Latest hemoglobin in last 72 hrs 8.1 g/dL     Electronically signed by: Tosin Ogunsiakan, MD 07/06/2024 10:50 AM   Time spent on discharge: 35 minutes *Some images could not be shown."

## 2024-07-07 ENCOUNTER — Telehealth: Payer: Self-pay

## 2024-07-07 NOTE — Transitions of Care (Post Inpatient/ED Visit) (Signed)
" ° °  07/07/2024  Name: Molly Gutierrez  MRN: [id number] DOB: 03-Apr-1981  Today's TOC FU Call Status: Today's TOC FU Call Status:: Unsuccessful Call (1st Attempt) Unsuccessful Call (1st Attempt) Date: 07/07/24  Attempted to reach the patient regarding the most recent Inpatient/ED visit.  Follow Up Plan: Additional outreach attempts will be made to reach the patient to complete the Transitions of Care (Post Inpatient/ED visit) call.   Arvin Seip RN, BSN, CCM [employer], Population Health Case Manager Phone: [phone]  "

## 2024-07-09 ENCOUNTER — Telehealth: Payer: Self-pay

## 2024-07-09 NOTE — Transitions of Care (Post Inpatient/ED Visit) (Signed)
" ° °  07/09/2024  Name: Azelia Stanwood  MRN: [id number] DOB: 1981-03-13  Today's TOC FU Call Status: Today's TOC FU Call Status:: Unsuccessful Call (2nd Attempt) Unsuccessful Call (2nd Attempt) Date: 07/09/24  Attempted to reach the patient regarding the most recent Inpatient/ED visit.  Follow Up Plan: Additional outreach attempts will be made to reach the patient to complete the Transitions of Care (Post Inpatient/ED visit) call.   Alan Ee, RN, BSN, CEN Population Health- Transition of Care Team.  [facility] [phone]  "

## 2024-07-12 ENCOUNTER — Other Ambulatory Visit: Payer: Self-pay

## 2024-07-12 NOTE — Progress Notes (Signed)
 Patient has not filled since May 2025. Dis-enrolling.

## 2024-07-16 ENCOUNTER — Other Ambulatory Visit: Payer: Self-pay

## 2024-07-16 DIAGNOSIS — G894 Chronic pain syndrome: Secondary | ICD-10-CM

## 2024-07-16 DIAGNOSIS — D5700 Hb-SS disease with crisis, unspecified: Secondary | ICD-10-CM

## 2024-07-16 MED ORDER — HYDROMORPHONE HCL 4 MG PO TABS: 4.0000 mg | ORAL_TABLET | Freq: Four times a day (QID) | ORAL | 0 refills | Status: AC | PRN

## 2024-07-16 NOTE — Telephone Encounter (Signed)
 Please Advise.  CB.

## 2024-07-21 ENCOUNTER — Other Ambulatory Visit: Payer: Self-pay

## 2024-08-16 ENCOUNTER — Ambulatory Visit: Payer: Self-pay | Admitting: Nurse Practitioner
